# Patient Record
Sex: Female | Born: 1945
Health system: Southern US, Community
[De-identification: ages and names within clinical notes are randomized; demographics above are authoritative.]

## PROBLEM LIST (undated history)

## (undated) DIAGNOSIS — E039 Hypothyroidism, unspecified: Secondary | ICD-10-CM

## (undated) DIAGNOSIS — M810 Age-related osteoporosis without current pathological fracture: Secondary | ICD-10-CM

## (undated) DIAGNOSIS — E669 Obesity, unspecified: Secondary | ICD-10-CM

## (undated) DIAGNOSIS — R51 Headache: Secondary | ICD-10-CM

## (undated) DIAGNOSIS — E785 Hyperlipidemia, unspecified: Secondary | ICD-10-CM

## (undated) DIAGNOSIS — T8859XA Other complications of anesthesia, initial encounter: Secondary | ICD-10-CM

## (undated) DIAGNOSIS — I10 Essential (primary) hypertension: Secondary | ICD-10-CM

## (undated) DIAGNOSIS — F419 Anxiety disorder, unspecified: Secondary | ICD-10-CM

## (undated) DIAGNOSIS — T7840XA Allergy, unspecified, initial encounter: Secondary | ICD-10-CM

## (undated) DIAGNOSIS — K219 Gastro-esophageal reflux disease without esophagitis: Secondary | ICD-10-CM

## (undated) DIAGNOSIS — F329 Major depressive disorder, single episode, unspecified: Secondary | ICD-10-CM

## (undated) DIAGNOSIS — F32A Depression, unspecified: Secondary | ICD-10-CM

## (undated) DIAGNOSIS — E079 Disorder of thyroid, unspecified: Secondary | ICD-10-CM

## (undated) DIAGNOSIS — I493 Ventricular premature depolarization: Secondary | ICD-10-CM

## (undated) DIAGNOSIS — N3281 Overactive bladder: Secondary | ICD-10-CM

## (undated) DIAGNOSIS — R001 Bradycardia, unspecified: Secondary | ICD-10-CM

## (undated) DIAGNOSIS — J449 Chronic obstructive pulmonary disease, unspecified: Secondary | ICD-10-CM

## (undated) DIAGNOSIS — E559 Vitamin D deficiency, unspecified: Secondary | ICD-10-CM

## (undated) DIAGNOSIS — T4145XA Adverse effect of unspecified anesthetic, initial encounter: Secondary | ICD-10-CM

## (undated) DIAGNOSIS — J42 Unspecified chronic bronchitis: Secondary | ICD-10-CM

## (undated) HISTORY — DX: Essential (primary) hypertension: I10

## (undated) HISTORY — DX: Obesity, unspecified: E66.9

## (undated) HISTORY — DX: Gastro-esophageal reflux disease without esophagitis: K21.9

## (undated) HISTORY — DX: Age-related osteoporosis without current pathological fracture: M81.0

## (undated) HISTORY — DX: Ventricular premature depolarization: I49.3

## (undated) HISTORY — PX: UPPER GASTROINTESTINAL ENDOSCOPY: SHX188

## (undated) HISTORY — DX: Allergy, unspecified, initial encounter: T78.40XA

## (undated) HISTORY — PX: ABDOMINAL HYSTERECTOMY: SHX81

## (undated) HISTORY — DX: Overactive bladder: N32.81

## (undated) HISTORY — DX: Anxiety disorder, unspecified: F41.9

## (undated) HISTORY — PX: COLONOSCOPY: SHX174

## (undated) HISTORY — PX: FRACTURE SURGERY: SHX138

## (undated) HISTORY — DX: Disorder of thyroid, unspecified: E07.9

## (undated) HISTORY — DX: Depression, unspecified: F32.A

## (undated) HISTORY — PX: SPINAL FUSION: SHX223

## (undated) HISTORY — DX: Hyperlipidemia, unspecified: E78.5

## (undated) HISTORY — DX: Major depressive disorder, single episode, unspecified: F32.9

## (undated) HISTORY — DX: Vitamin D deficiency, unspecified: E55.9

---

## 1985-05-27 HISTORY — PX: PARTIAL HYSTERECTOMY: SHX80

## 1988-05-27 HISTORY — PX: HIP FRACTURE SURGERY: SHX118

## 2000-02-11 ENCOUNTER — Emergency Department (HOSPITAL_COMMUNITY): Admission: EM | Admit: 2000-02-11 | Discharge: 2000-02-11 | Payer: Self-pay | Admitting: Emergency Medicine

## 2006-01-08 ENCOUNTER — Ambulatory Visit: Payer: Self-pay | Admitting: Cardiology

## 2006-01-21 ENCOUNTER — Encounter: Payer: Self-pay | Admitting: Cardiology

## 2006-01-21 ENCOUNTER — Ambulatory Visit: Payer: Self-pay

## 2008-11-23 ENCOUNTER — Encounter: Admission: RE | Admit: 2008-11-23 | Discharge: 2008-11-23 | Payer: Self-pay | Admitting: Cardiology

## 2010-04-23 ENCOUNTER — Ambulatory Visit: Payer: Self-pay | Admitting: Cardiology

## 2010-10-12 NOTE — Assessment & Plan Note (Signed)
[facility] OFFICE NOTE   NAME:[name], [name]                         MRN:          [id number]  DATE:01/08/2006                            DOB:          [birth year]    I was asked by Dr. Octavio Graves to evaluate [name], a very pleasant  65 year old, married, white female with an irregular heartbeat.  When she  was in Dr. Carmie End office recently, she was in ventricular bigeminy.   A TSH was normal, potassium was normal, as well as her other blood work.   She is having no dyspnea on exertion or anginal symptoms.  She had a heart  catheterization about six years ago with Dr. Doreatha Lew and this was apparently  normal.   PAST MEDICAL HISTORY:  Cardiac risk factors are significant for obesity and  hypertension.  She is also drinking about four cups of caffeinated beverages  a day.   ALLERGIES:  She is intolerant to SULFA, PENICILLIN, ANTIBIOTICS.   CURRENT MEDICATIONS:  1. Felodipine extended release 10 mg daily.  2. Synthroid 50 micrograms daily.  3. Alprazolam 0.25 p.o. p.r.n.  4. AcipHex 20 mg a day.   PAST SURGICAL HISTORY:  She has had a previous hysterectomy and a hip  replacement.  She is fairly immobile.   FAMILY HISTORY:  Noncontributory and negative for premature heart disease.   SOCIAL HISTORY:  She is a Agricultural engineer.  She has two children.  One has a  compulsive disorder and she pretty much has to look after him all the time.  In addition, her husband is in the hospital right now with a brain tumor,  but is getting better.  Obviously, she has been under a lot of stress.   REVIEW OF SYSTEMS:  Remarkable for constipation, fatigue, a lot of  gastroesophageal reflux, which is pretty classic and controlled with  AcipHex.  She has a history of hypothyroidism, on replacement therapy.  She  has chronic arthritis.   PHYSICAL EXAMINATION:  VITAL SIGNS:  Her blood pressure is 146/91, her pulse  was  71 and regular.  She is in sinus rhythm with one PVC on her EKG.  The  rest of her EKG is completely normal.  She may have some right atrial  enlargement.  Her height is 5 feet 4.  She weighs 260 pounds.  BMI is  obviously high.  HEENT:  Normocephalic, atraumatic.  PERRLA.  Extraocular movements intact.  Sclerae are clear.  Facial symmetry is normal.  Dentition is satisfactory.  NECK:  Carotid upstrokes were equal bilaterally, without bruits.  There is  no JVD.  Thyroid is not enlarged.  Trachea is midline.  LUNGS:  Clear.  HEART:  Reveals a regular rate and rhythm, soft S1, S2.  ABDOMEN:  Abdominal exam is soft with good bowel sounds.  She is obese,  making organomegaly difficult to assess.  EXTREMITIES:  Reveal no clubbing or cyanosis.  She has 1+ pitting edema.  Pulses are present.  NEUROLOGIC EXAM:  Intact.   ASSESSMENT AND PLAN:  1. Premature ventricular  beats.  At one point in Dr. Carmie End office,      there was ventricular bigeminy.  These are probably being aggravated by      the high level of stress, lack of sleep and also the caffeine she is      drinking.  We need to rule out structural heart disease.  Her blood      work is okay.  2. Hypertension, not under adequate control.  3. Obesity.   PLAN:  Obtain 2D echocardiogram to assess for structural heart disease and  elevated LV function, atrial sizes and question of pulmonary hypertension.  If this is within normal limits, or only shows mild LVH from her  hypertension, we will see her back on a p.r.n. basis.                               Thomas C. Verl Blalock, MD, [facility]    TCW/MedQ  DD:  01/08/2006  DT:  01/08/2006  Job #:  [id number]   cc:   Octavio Graves

## 2011-10-18 ENCOUNTER — Encounter: Payer: Self-pay | Admitting: *Deleted

## 2012-12-21 ENCOUNTER — Encounter (HOSPITAL_COMMUNITY): Payer: Self-pay | Admitting: *Deleted

## 2012-12-21 ENCOUNTER — Emergency Department (HOSPITAL_COMMUNITY): Payer: Medicare Other

## 2012-12-21 ENCOUNTER — Emergency Department (HOSPITAL_COMMUNITY)
Admission: EM | Admit: 2012-12-21 | Discharge: 2012-12-21 | Disposition: A | Discharge status: Home / Self Care | Payer: Medicare Other | Attending: Emergency Medicine | Admitting: Emergency Medicine

## 2012-12-21 DIAGNOSIS — R12 Heartburn: Secondary | ICD-10-CM | POA: Insufficient documentation

## 2012-12-21 DIAGNOSIS — Z881 Allergy status to other antibiotic agents status: Secondary | ICD-10-CM | POA: Insufficient documentation

## 2012-12-21 DIAGNOSIS — F411 Generalized anxiety disorder: Secondary | ICD-10-CM | POA: Insufficient documentation

## 2012-12-21 DIAGNOSIS — E079 Disorder of thyroid, unspecified: Secondary | ICD-10-CM | POA: Insufficient documentation

## 2012-12-21 DIAGNOSIS — R079 Chest pain, unspecified: Secondary | ICD-10-CM

## 2012-12-21 DIAGNOSIS — Z79899 Other long term (current) drug therapy: Secondary | ICD-10-CM | POA: Insufficient documentation

## 2012-12-21 DIAGNOSIS — I4949 Other premature depolarization: Secondary | ICD-10-CM | POA: Insufficient documentation

## 2012-12-21 DIAGNOSIS — Z888 Allergy status to other drugs, medicaments and biological substances status: Secondary | ICD-10-CM | POA: Insufficient documentation

## 2012-12-21 DIAGNOSIS — Z882 Allergy status to sulfonamides status: Secondary | ICD-10-CM | POA: Insufficient documentation

## 2012-12-21 DIAGNOSIS — Z880 Allergy status to penicillin: Secondary | ICD-10-CM | POA: Insufficient documentation

## 2012-12-21 DIAGNOSIS — I10 Essential (primary) hypertension: Secondary | ICD-10-CM | POA: Insufficient documentation

## 2012-12-21 DIAGNOSIS — K219 Gastro-esophageal reflux disease without esophagitis: Secondary | ICD-10-CM | POA: Insufficient documentation

## 2012-12-21 DIAGNOSIS — E669 Obesity, unspecified: Secondary | ICD-10-CM | POA: Insufficient documentation

## 2012-12-21 LAB — POCT I-STAT TROPONIN I
Troponin i, poc: 0.02 ng/mL (ref 0.00–0.08)
Troponin i, poc: 0 ng/mL (ref 0.00–0.08)

## 2012-12-21 LAB — CBC
Platelets: 327 10*3/uL (ref 150–400)
RBC: 4.72 MIL/uL (ref 3.87–5.11)
WBC: 9 10*3/uL (ref 4.0–10.5)

## 2012-12-21 LAB — BASIC METABOLIC PANEL
CO2: 29 mEq/L (ref 19–32)
Calcium: 9.6 mg/dL (ref 8.4–10.5)
Chloride: 101 mEq/L (ref 96–112)
Potassium: 3.6 mEq/L (ref 3.5–5.1)
Sodium: 140 mEq/L (ref 135–145)

## 2012-12-21 MED ORDER — GI COCKTAIL ~~LOC~~
30.0000 mL | Freq: Once | ORAL | Status: AC
  Administered 2012-12-21: 30 mL via ORAL
  Filled 2012-12-21: qty 30

## 2012-12-21 NOTE — ED Notes (Signed)
Dr Danny Lawless at bedside

## 2012-12-21 NOTE — ED Provider Notes (Signed)
CSN: [id number]     Arrival date & time 12/21/12  1526 History     First MD Initiated Contact with Patient 12/21/12 1703     Chief Complaint  Patient presents with  . Chest Pain   (Consider location/radiation/quality/duration/timing/severity/associated sxs/prior Treatment) HPI Comments: Pt w/ PMHx of GERD, HTN now w/ chest pain. States worsening GERD over past several days. Has been taking felodipine w/ temporary relief of sx. Last pm had worsening heart burn a/w left sided chest pain. Took felodipine w/ relief of sx. Chest pain was not exertional or pleuritic. Not radiating to back. Not a/w dyspnea, nausea or diaphoresis. Pain lasted 3 hrs and resolved w/ antacid. No chest pressure or sharp pain. She states she was laying on left side w/ left arm propped up at onset of pain. Has been doing a lot of heavy lifting recently. No Hx of CAD - negative cath in remote past, negative stress test several years ago. Non smokier, + family hx of CAD. No personal hx of HLD, DM, DVT/PE. Pt denies pain today. No hx of baseline exertional dyspnea or chest pain or orthopnea. + hx of BLE swelling. No known hx of CHF.   Patient is a 67 y.o. female presenting with chest pain. The history is provided by the patient. No language interpreter was used.  Chest Pain Pain location:  L chest Pain quality: burning   Pain radiates to:  Does not radiate Pain radiates to the back: no   Pain severity:  No pain Onset quality:  Gradual Duration:  3 hours Timing:  Intermittent Progression:  Worsening Chronicity:  Recurrent Context: eating   Relieved by:  Antacids (belching) Worsened by:  Nothing tried Associated symptoms: no abdominal pain, no cough, no dizziness, no fever, no headache, no nausea, no shortness of breath and not vomiting     Past Medical History  Diagnosis Date  . Hypertension   . Thyroid disease   . Obesity   . Chronic anxiety   . PVC's (premature ventricular contractions)   . Esophageal reflux     Past Surgical History  Procedure Laterality Date  . Partial hysterectomy  1987  . Hip fracture surgery  1990     pins removed in 1991   Family History  Problem Relation Age of Onset  . Arthritis    . Heart disease    . Cancer    . Diabetes    . OCD     History  Substance Use Topics  . Smoking status: Never Smoker   . Smokeless tobacco: Not on file  . Alcohol Use: Not on file   OB History   Grav Para Term Preterm Abortions TAB SAB Ect Mult Living                 Review of Systems  Constitutional: Negative for fever and chills.  HENT: Negative for congestion and sore throat.   Respiratory: Negative for cough and shortness of breath.   Cardiovascular: Positive for chest pain. Negative for leg swelling.  Gastrointestinal: Negative for nausea, vomiting, abdominal pain, diarrhea and constipation.  Genitourinary: Negative for dysuria and frequency.  Skin: Negative for color change and rash.  Neurological: Negative for dizziness and headaches.  Psychiatric/Behavioral: Negative for confusion and agitation.  All other systems reviewed and are negative.    Allergies  Celebrex; Keflet; Kenalog; Lisinopril; Penicillins; and Sulfa antibiotics  Home Medications   Current Outpatient Rx  Name  Route  Sig  Dispense  Refill  .  felodipine (PLENDIL) 10 MG 24 hr tablet   Oral   Take 10 mg by mouth daily.         . hydrochlorothiazide (HYDRODIURIL) 25 MG tablet   Oral   Take 25 mg by mouth daily as needed (for fluid).         Marland Kitchen HYDROCODONE-ACETAMINOPHEN PO   Oral   Take 1 tablet by mouth every 6 (six) hours as needed (for pain).         Marland Kitchen levothyroxine (SYNTHROID, LEVOTHROID) 50 MCG tablet   Oral   Take 50 mcg by mouth daily.         . meclizine (ANTIVERT) 25 MG tablet   Oral   Take 25 mg by mouth 3 (three) times daily as needed for dizziness.         . pantoprazole (PROTONIX) 40 MG tablet   Oral   Take 40 mg by mouth daily.         . Vitamin D,  Ergocalciferol, (DRISDOL) 50000 UNITS CAPS   Oral   Take 50,000 Units by mouth 2 (two) times a week.          BP 149/102  Pulse 70  Temp(Src) 97.9 F (36.6 C) (Oral)  Resp 20  Wt 250 lb (113.399 kg)  SpO2 100% Physical Exam  Vitals reviewed. Constitutional: She is oriented to person, place, and time. She appears well-developed and well-nourished. No distress.  HENT:  Head: Normocephalic and atraumatic.  Eyes: EOM are normal. Pupils are equal, round, and reactive to light.  Neck: Normal range of motion. Neck supple.  Cardiovascular: Normal rate and regular rhythm.   Pulses:      Radial pulses are 2+ on the right side, and 2+ on the left side.  Pulmonary/Chest: Effort normal and breath sounds normal. No respiratory distress.  Abdominal: Soft. She exhibits no distension. There is no tenderness.  Musculoskeletal: Normal range of motion. She exhibits no edema.  Neurological: She is alert and oriented to person, place, and time.  Skin: Skin is warm and dry.  Psychiatric: She has a normal mood and affect. Her behavior is normal.    ED Course   Procedures (including critical care time)  Results for orders placed during the hospital encounter of 12/21/12  CBC      Result Value Range   WBC 9.0  4.0 - 10.5 K/uL   RBC 4.72  3.87 - 5.11 MIL/uL   Hemoglobin 13.5  12.0 - 15.0 g/dL   HCT 40.7  36.0 - 46.0 %   MCV 86.2  78.0 - 100.0 fL   MCH 28.6  26.0 - 34.0 pg   MCHC 33.2  30.0 - 36.0 g/dL   RDW 13.8  11.5 - 15.5 %   Platelets 327  150 - 400 K/uL  BASIC METABOLIC PANEL      Result Value Range   Sodium 140  135 - 145 mEq/L   Potassium 3.6  3.5 - 5.1 mEq/L   Chloride 101  96 - 112 mEq/L   CO2 29  19 - 32 mEq/L   Glucose, Bld 106 (*) 70 - 99 mg/dL   BUN 13  6 - 23 mg/dL   Creatinine, Ser 0.87  0.50 - 1.10 mg/dL   Calcium 9.6  8.4 - 10.5 mg/dL   GFR calc non Af Amer 68 (*) >90 mL/min   GFR calc Af Amer 79 (*) >90 mL/min  PRO B NATRIURETIC PEPTIDE      Result Value Range  Pro B Natriuretic peptide (BNP) 420.4 (*) 0 - 125 pg/mL  POCT I-STAT TROPONIN I      Result Value Range   Troponin i, poc 0.02  0.00 - 0.08 ng/mL   Comment 3           POCT I-STAT TROPONIN I      Result Value Range   Troponin i, poc 0.00  0.00 - 0.08 ng/mL   Comment 3             Dg Chest 2 View  12/21/2012   *RADIOLOGY REPORT*  Clinical Data: Chest pain  CHEST - 2 VIEW  Comparison: None.  Findings: Lungs are clear.  Heart is upper normal in size with normal pulmonary vascularity.  No adenopathy.  Aorta is mildly tortuous with atherosclerotic change in the aorta.  There is degenerative change in the thoracic spine. No pneumothorax.  IMPRESSION: No edema or consolidation.   Original Report Authenticated By: Lowella Grip, M.D.    Date: 12/21/2012  Rate: 72  Rhythm: normal sinus rhythm and premature ventricular contractions (PVC)  QRS Axis: normal  Intervals: normal  ST/T Wave abnormalities: normal  Conduction Disutrbances:none  Narrative Interpretation:   Old EKG Reviewed: none available   No diagnosis found.  MDM  Exam as above, NAD, vitals unremarkable, chest pain free. Likely GERD - given GI cocktail. Doubt ACS (low risk- HEART score - 3, TIMI 1) and atypical story, doubt PE (low risk Wells and PERC + only for age). Doubt spont PTX, tamponade or dissection. ECG w/ frequent PVC, no STEMI or TWI or ST depression - no acute ischemia. CXR - NACPF, initial troponin negative, labs unremarkable, BNP - 420, repeat 2 hr troponin negative. Given GI cocktail w/ some improvement. Given coke w/ relief of sx after belch. Stable for d/c home. Doubt ACS, likely GERD w/ component of musculoskeletal pain. D/c home in good and stable condition. Given return precautions and follow up instructions.   I have personally reviewed labs and imaging and considered in my MDM. Case d/w Dr Betsey Holiday  1. GERD (gastroesophageal reflux disease)   2. Chest pain    New Prescriptions   No medications on file    Theodoro Clock [place] [address] [phone]  Schedule an appointment as soon as possible for a visit      Ernst Spell, MD 12/21/12 [phone]

## 2012-12-21 NOTE — ED Notes (Signed)
Phlebotomy at bedside attempting lab draw 

## 2012-12-21 NOTE — ED Notes (Signed)
Pt in c/o left sided chest pain and pain to left axillary area, pt states she has a lot of indigestion so she has written this pain off as being from that but last night the pain was more to the left side of her chest and the pain under her left arm was new, states this pain has subsided today but her "indigestion" pain is mild at this time, pt points to left mid chest when asked to describe this pain. Pt admits to intermittent weakness at times, but denies other symptoms. Pt in no distress at this time.

## 2012-12-21 NOTE — ED Notes (Signed)
Dr. Pollina at bedside   

## 2012-12-21 NOTE — ED Notes (Signed)
Pt alert and mentating appropriately upon d/c. Pt given d/c teaching and follow up care instructions. NAD noted upon d/c. Pt offered wheelchair. Pt states she will be ok to walk. Pt leaving with family. Pt has no further questions upon d/c.

## 2012-12-21 NOTE — Discharge Instructions (Signed)
Chest Pain (Nonspecific) °It is often hard to give a specific diagnosis for the cause of chest pain. There is always a chance that your pain could be related to something serious, such as a heart attack or a blood clot in the lungs. You need to follow up with your caregiver for further evaluation. °CAUSES  °· Heartburn. °· Pneumonia or bronchitis. °· Anxiety or stress. °· Inflammation around your heart (pericarditis) or lung (pleuritis or pleurisy). °· A blood clot in the lung. °· A collapsed lung (pneumothorax). It can develop suddenly on its own (spontaneous pneumothorax) or from injury (trauma) to the chest. °· Shingles infection (herpes zoster virus). °The chest wall is composed of bones, muscles, and cartilage. Any of these can be the source of the pain. °· The bones can be bruised by injury. °· The muscles or cartilage can be strained by coughing or overwork. °· The cartilage can be affected by inflammation and become sore (costochondritis). °DIAGNOSIS  °Lab tests or other studies, such as X-rays, electrocardiography, stress testing, or cardiac imaging, may be needed to find the cause of your pain.  °TREATMENT  °· Treatment depends on what may be causing your chest pain. Treatment may include: °· Acid blockers for heartburn. °· Anti-inflammatory medicine. °· Pain medicine for inflammatory conditions. °· Antibiotics if an infection is present. °· You may be advised to change lifestyle habits. This includes stopping smoking and avoiding alcohol, caffeine, and chocolate. °· You may be advised to keep your head raised (elevated) when sleeping. This reduces the chance of acid going backward from your stomach into your esophagus. °· Most of the time, nonspecific chest pain will improve within 2 to 3 days with rest and mild pain medicine. °HOME CARE INSTRUCTIONS  °· If antibiotics were prescribed, take your antibiotics as directed. Finish them even if you start to feel better. °· For the next few days, avoid physical  activities that bring on chest pain. Continue physical activities as directed. °· Do not smoke. °· Avoid drinking alcohol. °· Only take over-the-counter or prescription medicine for pain, discomfort, or fever as directed by your caregiver. °· Follow your caregiver's suggestions for further testing if your chest pain does not go away. °· Keep any follow-up appointments you made. If you do not go to an appointment, you could develop lasting (chronic) problems with pain. If there is any problem keeping an appointment, you must call to reschedule. °SEEK MEDICAL CARE IF:  °· You think you are having problems from the medicine you are taking. Read your medicine instructions carefully. °· Your chest pain does not go away, even after treatment. °· You develop a rash with blisters on your chest. °SEEK IMMEDIATE MEDICAL CARE IF:  °· You have increased chest pain or pain that spreads to your arm, neck, jaw, back, or abdomen. °· You develop shortness of breath, an increasing cough, or you are coughing up blood. °· You have severe back or abdominal pain, feel nauseous, or vomit. °· You develop severe weakness, fainting, or chills. °· You have a fever. °THIS IS AN EMERGENCY. Do not wait to see if the pain will go away. Get medical help at once. Call your local emergency services (911 in [place] Do not drive yourself to the hospital. °MAKE SURE YOU:  °· Understand these instructions. °· Will watch your condition. °· Will get help right away if you are not doing well or get worse. °Document Released: 02/20/2005 Document Revised: 08/05/2011 Document Reviewed: 12/17/2007 °ExitCare® Patient Information ©2014 [employer],   LL[employer].  Diet for Gastroesophageal Reflux Disease, Adult Reflux (acid reflux) is when acid from your stomach flows up into the esophagus. When acid comes in contact with the esophagus, the acid causes irritation and soreness (inflammation) in the esophagus. When reflux happens often or so severely that it causes damage to  the esophagus, it is called gastroesophageal reflux disease (GERD). Nutrition therapy can help ease the discomfort of GERD. FOODS OR DRINKS TO AVOID OR LIMIT  Smoking or chewing tobacco. Nicotine is one of the most potent stimulants to acid production in the gastrointestinal tract.  Caffeinated and decaffeinated coffee and black tea.  Regular or low-calorie carbonated beverages or energy drinks (caffeine-free carbonated beverages are allowed).   Strong spices, such as black pepper, white pepper, red pepper, cayenne, curry powder, and chili powder.  Peppermint or spearmint.  Chocolate.  High-fat foods, including meats and fried foods. Extra added fats including oils, butter, salad dressings, and nuts. Limit these to less than 8 tsp per day.  Fruits and vegetables if they are not tolerated, such as citrus fruits or tomatoes.  Alcohol.  Any food that seems to aggravate your condition. If you have questions regarding your diet, call your caregiver or a registered dietitian. OTHER THINGS THAT MAY HELP GERD INCLUDE:   Eating your meals slowly, in a relaxed setting.  Eating 5 to 6 small meals per day instead of 3 large meals.  Eliminating food for a period of time if it causes distress.  Not lying down until 3 hours after eating a meal.  Keeping the head of your bed raised 6 to 9 inches (15 to 23 cm) by using a foam wedge or blocks under the legs of the bed. Lying flat may make symptoms worse.  Being physically active. Weight loss may be helpful in reducing reflux in overweight or obese adults.  Wear loose fitting clothing EXAMPLE MEAL PLAN This meal plan is approximately 2,000 calories based on [email or web address] meal planning guidelines. Breakfast   cup cooked oatmeal.  1 cup strawberries.  1 cup low-fat milk.  1 oz almonds. Snack  1 cup cucumber slices.  6 oz yogurt (made from low-fat or fat-free milk). Lunch  2 slice whole-wheat bread.  2 oz sliced  [place].  2 tsp mayonnaise.  1 cup blueberries.  1 cup snap peas. Snack  6 whole-wheat crackers.  1 oz string cheese. Dinner   cup brown rice.  1 cup mixed veggies.  1 tsp olive oil.  3 oz grilled fish. Document Released: 05/13/2005 Document Revised: 08/05/2011 Document Reviewed: 03/29/2011 [facility] Patient Information 2014 [place], [place].   Gastroesophageal Reflux Disease, Adult Gastroesophageal reflux disease (GERD) happens when acid from your stomach flows up into the esophagus. When acid comes in contact with the esophagus, the acid causes soreness (inflammation) in the esophagus. Over time, GERD may create small holes (ulcers) in the lining of the esophagus. CAUSES   Increased body weight. This puts pressure on the stomach, making acid rise from the stomach into the esophagus.  Smoking. This increases acid production in the stomach.  Drinking alcohol. This causes decreased pressure in the lower esophageal sphincter (valve or ring of muscle between the esophagus and stomach), allowing acid from the stomach into the esophagus.  Late evening meals and a full stomach. This increases pressure and acid production in the stomach.  A malformed lower esophageal sphincter. Sometimes, no cause is found. SYMPTOMS   Burning pain in the lower part of the mid-chest behind the breastbone and in  the mid-stomach area. This may occur twice a week or more often.  Trouble swallowing.  Sore throat.  Dry cough.  Asthma-like symptoms including chest tightness, shortness of breath, or wheezing. DIAGNOSIS  Your caregiver may be able to diagnose GERD based on your symptoms. In some cases, X-rays and other tests may be done to check for complications or to check the condition of your stomach and esophagus. TREATMENT  Your caregiver may recommend over-the-counter or prescription medicines to help decrease acid production. Ask your caregiver before starting or adding any new medicines.   HOME CARE INSTRUCTIONS   Change the factors that you can control. Ask your caregiver for guidance concerning weight loss, quitting smoking, and alcohol consumption.  Avoid foods and drinks that make your symptoms worse, such as:  Caffeine or alcoholic drinks.  Chocolate.  Peppermint or mint flavorings.  Garlic and onions.  Spicy foods.  Citrus fruits, such as oranges, lemons, or limes.  Tomato-based foods such as sauce, chili, salsa, and pizza.  Fried and fatty foods.  Avoid lying down for the 3 hours prior to your bedtime or prior to taking a nap.  Eat small, frequent meals instead of large meals.  Wear loose-fitting clothing. Do not wear anything tight around your waist that causes pressure on your stomach.  Raise the head of your bed 6 to 8 inches with wood blocks to help you sleep. Extra pillows will not help.  Only take over-the-counter or prescription medicines for pain, discomfort, or fever as directed by your caregiver.  Do not take aspirin, ibuprofen, or other nonsteroidal anti-inflammatory drugs (NSAIDs). SEEK IMMEDIATE MEDICAL CARE IF:   You have pain in your arms, neck, jaw, teeth, or back.  Your pain increases or changes in intensity or duration.  You develop nausea, vomiting, or sweating (diaphoresis).  You develop shortness of breath, or you faint.  Your vomit is green, yellow, black, or looks like coffee grounds or blood.  Your stool is red, bloody, or black. These symptoms could be signs of other problems, such as heart disease, gastric bleeding, or esophageal bleeding. MAKE SURE YOU:   Understand these instructions.  Will watch your condition.  Will get help right away if you are not doing well or get worse. Document Released: 02/20/2005 Document Revised: 08/05/2011 Document Reviewed: 11/30/2010 [facility] Patient Information 2014 [place], [place].

## 2012-12-23 NOTE — ED Provider Notes (Signed)
I saw and evaluated the patient, reviewed the resident's note and I agree with the findings and plan.  Patient presented with atypical symptoms felt to be consistent with GERD rather than cardiac. Exam normal. Workup unremarkable. Agree with resident interpretation of EKG.  Orpah Greek, MD 12/23/12 [phone]

## 2013-09-09 DIAGNOSIS — M1990 Unspecified osteoarthritis, unspecified site: Secondary | ICD-10-CM | POA: Diagnosis not present

## 2013-09-09 DIAGNOSIS — E559 Vitamin D deficiency, unspecified: Secondary | ICD-10-CM | POA: Diagnosis not present

## 2013-09-09 DIAGNOSIS — I10 Essential (primary) hypertension: Secondary | ICD-10-CM | POA: Diagnosis not present

## 2013-09-09 DIAGNOSIS — K219 Gastro-esophageal reflux disease without esophagitis: Secondary | ICD-10-CM | POA: Diagnosis not present

## 2013-09-09 DIAGNOSIS — E039 Hypothyroidism, unspecified: Secondary | ICD-10-CM | POA: Diagnosis not present

## 2013-09-09 DIAGNOSIS — R7309 Other abnormal glucose: Secondary | ICD-10-CM | POA: Diagnosis not present

## 2013-09-27 DIAGNOSIS — K8020 Calculus of gallbladder without cholecystitis without obstruction: Secondary | ICD-10-CM | POA: Diagnosis not present

## 2013-09-27 DIAGNOSIS — E876 Hypokalemia: Secondary | ICD-10-CM | POA: Diagnosis not present

## 2013-09-27 DIAGNOSIS — R1011 Right upper quadrant pain: Secondary | ICD-10-CM | POA: Diagnosis not present

## 2013-09-27 DIAGNOSIS — I10 Essential (primary) hypertension: Secondary | ICD-10-CM | POA: Diagnosis not present

## 2013-10-01 ENCOUNTER — Encounter (INDEPENDENT_AMBULATORY_CARE_PROVIDER_SITE_OTHER): Payer: Self-pay | Admitting: Surgery

## 2013-10-04 ENCOUNTER — Encounter (INDEPENDENT_AMBULATORY_CARE_PROVIDER_SITE_OTHER): Payer: Self-pay | Admitting: Surgery

## 2013-10-04 ENCOUNTER — Ambulatory Visit (INDEPENDENT_AMBULATORY_CARE_PROVIDER_SITE_OTHER): Payer: Medicare Other | Admitting: Surgery

## 2013-10-04 VITALS — BP 152/80 | HR 74 | Temp 97.2°F | Resp 16 | Ht 67.0 in | Wt 264.0 lb

## 2013-10-04 DIAGNOSIS — K8010 Calculus of gallbladder with chronic cholecystitis without obstruction: Secondary | ICD-10-CM

## 2013-10-04 NOTE — Progress Notes (Signed)
Patient ID: [name], female   DOB: 04-09-1946, 68 y.o.   MRN: [id number]  Chief Complaint  Patient presents with  . New Evaluation    eval gallstones    HPI [name] is a 68 y.o. female.  Referred by Cindy Nearing, PA at the [facility] for gallbladder disease  HPI This is a 68 yo female with documented gallstones in 2010 who presents with worsening symptoms of epigastric pain and diarrhea.  These symptoms seem to be worse after eating greasy foods.  She reports some chest tightness when these symptoms are present.  She had a previous work-up by Cardiology that discovered the presence of the gallstones.  She has significant reflux symptoms that are usually well-controlled with Zegerid.  She had a previous endoscopic work-up by Dr. Juanita Saunders that showed only GERD.  Past Medical History  Diagnosis Date  . Hypertension   . Thyroid disease   . Obesity   . Chronic anxiety   . PVC's (premature ventricular contractions)   . Esophageal reflux   . Depression   . Allergy   . Hyperlipidemia   . Osteoporosis   . Overactive bladder   . Vitamin D deficiency disease     Past Surgical History  Procedure Laterality Date  . Partial hysterectomy  1987  . Hip fracture surgery  1990     pins removed in 1991    Family History  Problem Relation Age of Onset  . Arthritis    . Heart disease    . Cancer    . Diabetes    . OCD      Social History History  Substance Use Topics  . Smoking status: Never Smoker   . Smokeless tobacco: Not on file  . Alcohol Use: No    Allergies  Allergen Reactions  . Celebrex [Celecoxib] Swelling  . Keflet [Cephalexin] Swelling  . Kenalog [Triamcinolone Acetonide]     unknown  . Lisinopril Other (See Comments)    unknown  . Penicillins Hives and Swelling  . Sulfa Antibiotics Swelling    Current Outpatient Prescriptions  Medication Sig Dispense Refill  . ACETAMINOPHEN ER PO Take by mouth.      . ALPRAZolam (XANAX) 0.5 MG tablet  Take 0.5 mg by mouth at bedtime as needed for anxiety.      Marland Kitchen buPROPion (WELLBUTRIN XL) 150 MG 24 hr tablet Take 150 mg by mouth daily.      . fluticasone (FLONASE) 50 MCG/ACT nasal spray Place into both nostrils daily.      . hydrochlorothiazide (HYDRODIURIL) 25 MG tablet Take 25 mg by mouth daily as needed (for fluid).      Marland Kitchen levothyroxine (SYNTHROID, LEVOTHROID) 50 MCG tablet Take 50 mcg by mouth daily.      Marland Kitchen losartan (COZAAR) 100 MG tablet Take 100 mg by mouth daily.      . meclizine (ANTIVERT) 25 MG tablet Take 25 mg by mouth 3 (three) times daily as needed for dizziness.      . pantoprazole (PROTONIX) 40 MG tablet Take 40 mg by mouth daily.      . potassium chloride (K-DUR) 10 MEQ tablet Take 10 mEq by mouth daily.      . sertraline (ZOLOFT) 100 MG tablet Take 100 mg by mouth daily.      . Vitamin D, Ergocalciferol, (DRISDOL) 50000 UNITS CAPS Take 50,000 Units by mouth 2 (two) times a week.      . felodipine (PLENDIL) 10 MG 24 hr tablet  Take 10 mg by mouth daily.      Marland Kitchen HYDROCODONE-ACETAMINOPHEN PO Take 1 tablet by mouth every 6 (six) hours as needed (for pain).      . meloxicam (MOBIC) 7.5 MG tablet Take 7.5 mg by mouth daily.      . simvastatin (ZOCOR) 20 MG tablet Take 20 mg by mouth daily.      Marland Kitchen tolterodine (DETROL LA) 4 MG 24 hr capsule Take 4 mg by mouth daily.       No current facility-administered medications for this visit.    Review of Systems Review of Systems  Constitutional: Negative for fever, chills and unexpected weight change.  HENT: Negative for congestion, hearing loss, sore throat, trouble swallowing and voice change.   Eyes: Negative for visual disturbance.  Respiratory: Negative for cough and wheezing.   Cardiovascular: Positive for chest pain. Negative for palpitations and leg swelling.  Gastrointestinal: Positive for abdominal pain, diarrhea and abdominal distention. Negative for nausea, vomiting, constipation, blood in stool and anal bleeding.   Genitourinary: Negative for hematuria, vaginal bleeding and difficulty urinating.  Musculoskeletal: Negative for arthralgias.  Skin: Negative for rash and wound.  Neurological: Negative for seizures, syncope and headaches.  Hematological: Negative for adenopathy. Does not bruise/bleed easily.  Psychiatric/Behavioral: Negative for confusion.    Blood pressure 152/80, pulse 74, temperature 97.2 F (36.2 C), temperature source Temporal, resp. rate 16, height 5\' 7"  (1.702 m), weight 264 lb (119.75 kg).  Physical Exam Physical Exam WDWN in NAD HEENT:  EOMI, sclera anicteric Neck:  No masses, no thyromegaly Lungs:  CTA bilaterally; normal respiratory effort CV:  Regular rate and rhythm; no murmurs Abd:  +bowel sounds, soft, minimal epigastric tenderness;  Ext:  Well-perfused; no edema Skin:  Warm, dry; no sign of jaundice  Data Reviewed Ultrasound 11/23/08 Clinical Data: Epigastric and chest pain, hypertension  COMPLETE ABDOMINAL ULTRASOUND  Comparison: None  Findings:  Gallbladder: There are small gallstones present, with the largest  measuring 1.2 cm in maximum diameter. No pain is present over the  gallbladder upon compression, and no gallbladder wall thickening is  seen.  Common bile duct: The common bile duct is normal measuring 6.0 mm  in diameter.  Liver: The liver is echogenic consistent with fatty infiltration.  No focal abnormality is seen and no ductal dilatation is noted.  IVC: Visualized.  Pancreas: The pancreas is normal in size.  Spleen: The spleen appears normal.  Right Kidney: No hydronephrosis is seen. The right kidney  measures 10.4 cm sagittally.  Left Kidney: No hydronephrosis. The left kidney measures 10.6 cm.  Abdominal aorta: The abdominal aorta is normal in caliber.  IMPRESSION:  1. Small gallstones with the largest measuring 1.2 cm in maximum  diameter. No gallbladder wall thickening or pain over the  gallbladder.  2. Fatty infiltration of the  liver.  Provider: Ulyess Mort   Assessment    Chronic calculus cholecystitis Gastroesophageal reflux disease     Plan    Laparoscopic cholecystectomy with intraoperative cholangiogram. The surgical procedure has been discussed with the patient.  Potential risks, benefits, alternative treatments, and expected outcomes have been explained.  All of the patient's questions at this time have been answered.  The likelihood of reaching the patient's treatment goal is good.  The patient understand the proposed surgical procedure and wishes to proceed. The patient understands that this surgery will do little to affect her GERD symptoms.  If her symptoms worsen, she will return to see Dr. Collene Mares.  Imogene Burn. Terianne Thaker 10/04/2013, 10:32 AM

## 2013-10-13 DIAGNOSIS — L255 Unspecified contact dermatitis due to plants, except food: Secondary | ICD-10-CM | POA: Diagnosis not present

## 2013-10-19 ENCOUNTER — Telehealth (INDEPENDENT_AMBULATORY_CARE_PROVIDER_SITE_OTHER): Payer: Self-pay | Admitting: General Surgery

## 2013-10-19 NOTE — Telephone Encounter (Signed)
Pt called to ask what some words on her diagnosis for surgery meant.  She spelled out "cholecystitis with calculi-primary."  Explained all to her satisfactory understanding.

## 2013-10-20 ENCOUNTER — Ambulatory Visit (INDEPENDENT_AMBULATORY_CARE_PROVIDER_SITE_OTHER): Payer: Medicare Other | Admitting: General Surgery

## 2013-10-20 ENCOUNTER — Encounter (INDEPENDENT_AMBULATORY_CARE_PROVIDER_SITE_OTHER): Payer: Self-pay | Admitting: Surgery

## 2013-10-25 ENCOUNTER — Encounter (HOSPITAL_COMMUNITY): Payer: Self-pay

## 2013-10-26 NOTE — Pre-Procedure Instructions (Signed)
Keyerra Ansell  10/26/2013   Your procedure is scheduled on:  Thurs, June 11 @ 11:30 AM  Report to [facility] at 9:30 AM.  Call this number if you have problems the morning of surgery: [phone]   Remember:   Do not eat food or drink liquids after midnight.   Take these medicines the morning of surgery with A SIP OF WATER: Xanax(Alprazolam),Wellbutrin(Bupropion),Synthroid(Levothyroxine),Antivert(Meclizine),Zegerid(Omeprazole-Sodium Bicarb),Pantoprazole(Protonix),and Sertraline(Zoloft)                No Goody's,BC's,Aleve,Aspirin,Ibuprofen,Fish Oil,or any Herbal Medications    Do not wear jewelry, make-up or nail polish.  Do not wear lotions, powders, or perfumes. You may wear deodorant.  Do not shave 48 hours prior to surgery.   Do not bring valuables to the hospital.  [facility] is not responsible                  for any belongings or valuables.               Contacts, dentures or bridgework may not be worn into surgery.  Leave suitcase in the car. After surgery it may be brought to your room.  For patients admitted to the hospital, discharge time is determined by your                treatment team.               Patients discharged the day of surgery will not be allowed to drive  home.    Special Instructions:  [place] - Preparing for Surgery  Before surgery, you can play an important role.  Because skin is not sterile, your skin needs to be as free of germs as possible.  You can reduce the number of germs on you skin by washing with CHG (chlorahexidine gluconate) soap before surgery.  CHG is an antiseptic cleaner which kills germs and bonds with the skin to continue killing germs even after washing.  Please DO NOT use if you have an allergy to CHG or antibacterial soaps.  If your skin becomes reddened/irritated stop using the CHG and inform your nurse when you arrive at Short Stay.  Do not shave (including legs and underarms) for at least 48 hours prior to the  first CHG shower.  You may shave your face.  Please follow these instructions carefully:   1.  Shower with CHG Soap the night before surgery and the                                morning of Surgery.  2.  If you choose to wash your hair, wash your hair first as usual with your       normal shampoo.  3.  After you shampoo, rinse your hair and body thoroughly to remove the                      Shampoo.  4.  Use CHG as you would any other liquid soap.  You can apply chg directly       to the skin and wash gently with scrungie or a clean washcloth.  5.  Apply the CHG Soap to your body ONLY FROM THE NECK DOWN.        Do not use on open wounds or open sores.  Avoid contact with your eyes,       ears, mouth and genitals (private  parts).  Wash genitals (private parts)       with your normal soap.  6.  Wash thoroughly, paying special attention to the area where your surgery        will be performed.  7.  Thoroughly rinse your body with warm water from the neck down.  8.  DO NOT shower/wash with your normal soap after using and rinsing off       the CHG Soap.  9.  Pat yourself dry with a clean towel.            10.  Wear clean pajamas.            11.  Place clean sheets on your bed the night of your first shower and do not        sleep with pets.  Day of Surgery  Do not apply any lotions/deoderants the morning of surgery.  Please wear clean clothes to the hospital/surgery center.     Please read over the following fact sheets that you were given: Pain Booklet, Coughing and Deep Breathing, Blood Transfusion Information and Surgical Site Infection Prevention

## 2013-10-27 ENCOUNTER — Encounter (HOSPITAL_COMMUNITY)
Admission: RE | Admit: 2013-10-27 | Discharge: 2013-10-27 | Disposition: A | Discharge status: Home / Self Care | Payer: Medicare Other | Source: Ambulatory Visit | Attending: Surgery | Admitting: Surgery

## 2013-10-27 ENCOUNTER — Encounter (HOSPITAL_COMMUNITY): Payer: Self-pay

## 2013-10-27 DIAGNOSIS — Z01812 Encounter for preprocedural laboratory examination: Secondary | ICD-10-CM | POA: Diagnosis not present

## 2013-10-27 HISTORY — DX: Other complications of anesthesia, initial encounter: T88.59XA

## 2013-10-27 HISTORY — DX: Headache: R51

## 2013-10-27 HISTORY — DX: Unspecified chronic bronchitis: J42

## 2013-10-27 HISTORY — DX: Chronic obstructive pulmonary disease, unspecified: J44.9

## 2013-10-27 HISTORY — DX: Hypothyroidism, unspecified: E03.9

## 2013-10-27 HISTORY — DX: Adverse effect of unspecified anesthetic, initial encounter: T41.45XA

## 2013-10-27 LAB — CBC
HEMATOCRIT: 39.1 % (ref 36.0–46.0)
Hemoglobin: 12.8 g/dL (ref 12.0–15.0)
MCH: 28.7 pg (ref 26.0–34.0)
MCHC: 32.7 g/dL (ref 30.0–36.0)
MCV: 87.7 fL (ref 78.0–100.0)
Platelets: 379 10*3/uL (ref 150–400)
RBC: 4.46 MIL/uL (ref 3.87–5.11)
RDW: 14.1 % (ref 11.5–15.5)
WBC: 9.5 10*3/uL (ref 4.0–10.5)

## 2013-10-27 LAB — BASIC METABOLIC PANEL
BUN: 11 mg/dL (ref 6–23)
CO2: 30 meq/L (ref 19–32)
CREATININE: 0.99 mg/dL (ref 0.50–1.10)
Calcium: 9.5 mg/dL (ref 8.4–10.5)
Chloride: 101 mEq/L (ref 96–112)
GFR calc Af Amer: 67 mL/min — ABNORMAL LOW (ref >90)
GFR calc non Af Amer: 58 mL/min — ABNORMAL LOW (ref >90)
Glucose, Bld: 104 mg/dL — ABNORMAL HIGH (ref 70–99)
Potassium: 3.9 mEq/L (ref 3.7–5.3)
Sodium: 140 mEq/L (ref 137–147)

## 2013-11-03 MED ORDER — CIPROFLOXACIN IN D5W 400 MG/200ML IV SOLN
400.0000 mg | INTRAVENOUS | Status: DC
  Filled 2013-11-03: qty 200

## 2013-11-03 MED ORDER — CHLORHEXIDINE GLUCONATE 4 % EX LIQD
1.0000 "application " | Freq: Once | CUTANEOUS | Status: DC
  Filled 2013-11-03: qty 15

## 2013-11-04 ENCOUNTER — Ambulatory Visit (HOSPITAL_COMMUNITY): Payer: Medicare Other

## 2013-11-04 ENCOUNTER — Ambulatory Visit (HOSPITAL_COMMUNITY): Payer: Medicare Other | Admitting: Certified Registered Nurse Anesthetist

## 2013-11-04 ENCOUNTER — Encounter (HOSPITAL_COMMUNITY): Payer: Self-pay | Admitting: Anesthesiology

## 2013-11-04 ENCOUNTER — Encounter (HOSPITAL_COMMUNITY): Payer: Medicare Other | Admitting: Certified Registered Nurse Anesthetist

## 2013-11-04 ENCOUNTER — Ambulatory Visit (HOSPITAL_COMMUNITY)
Admission: RE | Admit: 2013-11-04 | Discharge: 2013-11-04 | Disposition: A | Discharge status: Home / Self Care | Payer: Medicare Other | Source: Ambulatory Visit | Attending: Surgery | Admitting: Surgery

## 2013-11-04 ENCOUNTER — Encounter (HOSPITAL_COMMUNITY)
Admission: RE | Disposition: A | Discharge status: Home / Self Care | Payer: Self-pay | Source: Ambulatory Visit | Attending: Surgery

## 2013-11-04 DIAGNOSIS — I10 Essential (primary) hypertension: Secondary | ICD-10-CM | POA: Insufficient documentation

## 2013-11-04 DIAGNOSIS — K8010 Calculus of gallbladder with chronic cholecystitis without obstruction: Secondary | ICD-10-CM

## 2013-11-04 DIAGNOSIS — Z79899 Other long term (current) drug therapy: Secondary | ICD-10-CM | POA: Insufficient documentation

## 2013-11-04 DIAGNOSIS — Z880 Allergy status to penicillin: Secondary | ICD-10-CM | POA: Insufficient documentation

## 2013-11-04 DIAGNOSIS — E559 Vitamin D deficiency, unspecified: Secondary | ICD-10-CM | POA: Diagnosis not present

## 2013-11-04 DIAGNOSIS — E669 Obesity, unspecified: Secondary | ICD-10-CM | POA: Diagnosis not present

## 2013-11-04 DIAGNOSIS — F3289 Other specified depressive episodes: Secondary | ICD-10-CM | POA: Diagnosis not present

## 2013-11-04 DIAGNOSIS — F411 Generalized anxiety disorder: Secondary | ICD-10-CM | POA: Diagnosis not present

## 2013-11-04 DIAGNOSIS — E785 Hyperlipidemia, unspecified: Secondary | ICD-10-CM | POA: Insufficient documentation

## 2013-11-04 DIAGNOSIS — E079 Disorder of thyroid, unspecified: Secondary | ICD-10-CM | POA: Diagnosis not present

## 2013-11-04 DIAGNOSIS — K819 Cholecystitis, unspecified: Secondary | ICD-10-CM | POA: Diagnosis not present

## 2013-11-04 DIAGNOSIS — K7689 Other specified diseases of liver: Secondary | ICD-10-CM | POA: Diagnosis not present

## 2013-11-04 DIAGNOSIS — F329 Major depressive disorder, single episode, unspecified: Secondary | ICD-10-CM | POA: Diagnosis not present

## 2013-11-04 DIAGNOSIS — M810 Age-related osteoporosis without current pathological fracture: Secondary | ICD-10-CM | POA: Diagnosis not present

## 2013-11-04 DIAGNOSIS — Z888 Allergy status to other drugs, medicaments and biological substances status: Secondary | ICD-10-CM | POA: Insufficient documentation

## 2013-11-04 DIAGNOSIS — Z882 Allergy status to sulfonamides status: Secondary | ICD-10-CM | POA: Insufficient documentation

## 2013-11-04 DIAGNOSIS — Z886 Allergy status to analgesic agent status: Secondary | ICD-10-CM | POA: Diagnosis not present

## 2013-11-04 DIAGNOSIS — J449 Chronic obstructive pulmonary disease, unspecified: Secondary | ICD-10-CM | POA: Diagnosis not present

## 2013-11-04 DIAGNOSIS — K219 Gastro-esophageal reflux disease without esophagitis: Secondary | ICD-10-CM | POA: Diagnosis not present

## 2013-11-04 HISTORY — PX: CHOLECYSTECTOMY: SHX55

## 2013-11-04 SURGERY — LAPAROSCOPIC CHOLECYSTECTOMY WITH INTRAOPERATIVE CHOLANGIOGRAM
Anesthesia: General | Site: Abdomen

## 2013-11-04 MED ORDER — 0.9 % SODIUM CHLORIDE (POUR BTL) OPTIME
TOPICAL | Status: DC | PRN
  Administered 2013-11-04: 1000 mL

## 2013-11-04 MED ORDER — ONDANSETRON HCL 4 MG/2ML IJ SOLN: 4.0000 mg | INTRAMUSCULAR | Status: DC | PRN

## 2013-11-04 MED ORDER — PROPOFOL 10 MG/ML IV BOLUS
INTRAVENOUS | Status: DC | PRN
  Administered 2013-11-04: 150 mg via INTRAVENOUS

## 2013-11-04 MED ORDER — OXYCODONE HCL 5 MG PO TABS: 5.0000 mg | ORAL_TABLET | Freq: Once | ORAL | Status: DC | PRN

## 2013-11-04 MED ORDER — MORPHINE SULFATE 2 MG/ML IJ SOLN: 2.0000 mg | INTRAMUSCULAR | Status: DC | PRN

## 2013-11-04 MED ORDER — ONDANSETRON HCL 4 MG/2ML IJ SOLN
INTRAMUSCULAR | Status: AC
  Filled 2013-11-04: qty 2

## 2013-11-04 MED ORDER — LIDOCAINE HCL (CARDIAC) 20 MG/ML IV SOLN
INTRAVENOUS | Status: DC | PRN
  Administered 2013-11-04: 30 mg via INTRAVENOUS

## 2013-11-04 MED ORDER — BUPIVACAINE-EPINEPHRINE (PF) 0.25% -1:200000 IJ SOLN
INTRAMUSCULAR | Status: AC
  Filled 2013-11-04: qty 30

## 2013-11-04 MED ORDER — ONDANSETRON HCL 4 MG/2ML IJ SOLN
INTRAMUSCULAR | Status: DC | PRN
Start: 2013-11-04 — End: 2013-11-04
  Administered 2013-11-04: 4 mg via INTRAVENOUS

## 2013-11-04 MED ORDER — DEXAMETHASONE SODIUM PHOSPHATE 4 MG/ML IJ SOLN
INTRAMUSCULAR | Status: DC | PRN
  Administered 2013-11-04: 4 mg via INTRAVENOUS

## 2013-11-04 MED ORDER — GLYCOPYRROLATE 0.2 MG/ML IJ SOLN
INTRAMUSCULAR | Status: DC | PRN
  Administered 2013-11-04: 0.2 mg via INTRAVENOUS
  Administered 2013-11-04: 0.6 mg via INTRAVENOUS

## 2013-11-04 MED ORDER — OXYCODONE-ACETAMINOPHEN 5-325 MG PO TABS
1.0000 | ORAL_TABLET | ORAL | Status: DC | PRN
Start: 2013-11-04 — End: 2013-11-04

## 2013-11-04 MED ORDER — LACTATED RINGERS IV SOLN
INTRAVENOUS | Status: DC
  Administered 2013-11-04: 10:00:00 via INTRAVENOUS

## 2013-11-04 MED ORDER — LACTATED RINGERS IV SOLN
INTRAVENOUS | Status: DC | PRN
  Administered 2013-11-04: 11:00:00 via INTRAVENOUS

## 2013-11-04 MED ORDER — MIDAZOLAM HCL 2 MG/2ML IJ SOLN
INTRAMUSCULAR | Status: AC
  Filled 2013-11-04: qty 2

## 2013-11-04 MED ORDER — ARTIFICIAL TEARS OP OINT
TOPICAL_OINTMENT | OPHTHALMIC | Status: AC
  Filled 2013-11-04: qty 3.5

## 2013-11-04 MED ORDER — ROCURONIUM BROMIDE 100 MG/10ML IV SOLN
INTRAVENOUS | Status: DC | PRN
  Administered 2013-11-04: 45 mg via INTRAVENOUS

## 2013-11-04 MED ORDER — BUPIVACAINE-EPINEPHRINE 0.25% -1:200000 IJ SOLN
INTRAMUSCULAR | Status: DC | PRN
  Administered 2013-11-04: 30 mL

## 2013-11-04 MED ORDER — HYDROMORPHONE HCL PF 1 MG/ML IJ SOLN: 0.2500 mg | INTRAMUSCULAR | Status: DC | PRN

## 2013-11-04 MED ORDER — FENTANYL CITRATE 0.05 MG/ML IJ SOLN
INTRAMUSCULAR | Status: AC
  Filled 2013-11-04: qty 5

## 2013-11-04 MED ORDER — SODIUM CHLORIDE 0.9 % IR SOLN
Status: DC | PRN
  Administered 2013-11-04: 1000 mL

## 2013-11-04 MED ORDER — ONDANSETRON HCL 4 MG/2ML IJ SOLN: 4.0000 mg | Freq: Once | INTRAMUSCULAR | Status: DC | PRN

## 2013-11-04 MED ORDER — NEOSTIGMINE METHYLSULFATE 10 MG/10ML IV SOLN
INTRAVENOUS | Status: DC | PRN
  Administered 2013-11-04: 4 mg via INTRAVENOUS

## 2013-11-04 MED ORDER — PROPOFOL 10 MG/ML IV BOLUS
INTRAVENOUS | Status: AC
  Filled 2013-11-04: qty 20

## 2013-11-04 MED ORDER — OXYCODONE HCL 5 MG/5ML PO SOLN: 5.0000 mg | Freq: Once | ORAL | Status: DC | PRN

## 2013-11-04 MED ORDER — ROCURONIUM BROMIDE 50 MG/5ML IV SOLN
INTRAVENOUS | Status: AC
  Filled 2013-11-04: qty 1

## 2013-11-04 MED ORDER — NEOSTIGMINE METHYLSULFATE 10 MG/10ML IV SOLN
INTRAVENOUS | Status: AC
  Filled 2013-11-04: qty 1

## 2013-11-04 MED ORDER — GLYCOPYRROLATE 0.2 MG/ML IJ SOLN
INTRAMUSCULAR | Status: AC
  Filled 2013-11-04: qty 2

## 2013-11-04 MED ORDER — ARTIFICIAL TEARS OP OINT
TOPICAL_OINTMENT | OPHTHALMIC | Status: DC | PRN
  Administered 2013-11-04: 1 via OPHTHALMIC

## 2013-11-04 MED ORDER — FENTANYL CITRATE 0.05 MG/ML IJ SOLN
INTRAMUSCULAR | Status: DC | PRN
  Administered 2013-11-04: 100 ug via INTRAVENOUS
  Administered 2013-11-04: 50 ug via INTRAVENOUS

## 2013-11-04 MED ORDER — SODIUM CHLORIDE 0.9 % IV SOLN
INTRAVENOUS | Status: DC | PRN
  Administered 2013-11-04 (×2)

## 2013-11-04 MED ORDER — OXYCODONE-ACETAMINOPHEN 5-325 MG PO TABS: 1.0000 | ORAL_TABLET | ORAL | Status: DC | PRN

## 2013-11-04 MED ORDER — CIPROFLOXACIN IN D5W 400 MG/200ML IV SOLN
INTRAVENOUS | Status: AC
  Administered 2013-11-04: 400 mg via INTRAVENOUS
  Filled 2013-11-04: qty 200

## 2013-11-04 MED ORDER — HEMOSTATIC AGENTS (NO CHARGE) OPTIME
TOPICAL | Status: DC | PRN
  Administered 2013-11-04: 1 via TOPICAL

## 2013-11-04 SURGICAL SUPPLY — 54 items
APL SKNCLS STERI-STRIP NONHPOA (GAUZE/BANDAGES/DRESSINGS) ×1
APPLIER CLIP ROT 10 11.4 M/L (STAPLE) ×3
APR CLP MED LRG 11.4X10 (STAPLE) ×1
BAG SPEC RTRVL LRG 6X4 10 (ENDOMECHANICALS) ×1
BENZOIN TINCTURE PRP APPL 2/3 (GAUZE/BANDAGES/DRESSINGS) ×3 IMPLANT
BLADE SURG ROTATE 9660 (MISCELLANEOUS) IMPLANT
CANISTER SUCTION 2500CC (MISCELLANEOUS) ×3 IMPLANT
CHLORAPREP W/TINT 26ML (MISCELLANEOUS) ×3 IMPLANT
CLIP APPLIE ROT 10 11.4 M/L (STAPLE) ×1 IMPLANT
CLOSURE STERI-STRIP 1/2X4 (GAUZE/BANDAGES/DRESSINGS) ×1
CLSR STERI-STRIP ANTIMIC 1/2X4 (GAUZE/BANDAGES/DRESSINGS) ×1 IMPLANT
COVER MAYO STAND STRL (DRAPES) ×3 IMPLANT
COVER SURGICAL LIGHT HANDLE (MISCELLANEOUS) ×3 IMPLANT
DRAPE C-ARM 42X72 X-RAY (DRAPES) ×3 IMPLANT
DRAPE UTILITY 15X26 W/TAPE STR (DRAPE) ×6 IMPLANT
DRSG TEGADERM 2-3/8X2-3/4 SM (GAUZE/BANDAGES/DRESSINGS) ×5 IMPLANT
DRSG TEGADERM 4X4.75 (GAUZE/BANDAGES/DRESSINGS) ×3 IMPLANT
ELECT REM PT RETURN 9FT ADLT (ELECTROSURGICAL) ×3
ELECTRODE REM PT RTRN 9FT ADLT (ELECTROSURGICAL) ×1 IMPLANT
FILTER SMOKE EVAC LAPAROSHD (FILTER) ×3 IMPLANT
GAUZE SPONGE 2X2 8PLY STRL LF (GAUZE/BANDAGES/DRESSINGS) ×1 IMPLANT
GLOVE BIO SURGEON STRL SZ7 (GLOVE) ×5 IMPLANT
GLOVE BIO SURGEON STRL SZ8 (GLOVE) ×4 IMPLANT
GLOVE BIOGEL PI IND STRL 6.5 (GLOVE) IMPLANT
GLOVE BIOGEL PI IND STRL 7.0 (GLOVE) IMPLANT
GLOVE BIOGEL PI IND STRL 7.5 (GLOVE) ×1 IMPLANT
GLOVE BIOGEL PI IND STRL 8 (GLOVE) IMPLANT
GLOVE BIOGEL PI INDICATOR 6.5 (GLOVE) ×2
GLOVE BIOGEL PI INDICATOR 7.0 (GLOVE) ×4
GLOVE BIOGEL PI INDICATOR 7.5 (GLOVE) ×2
GLOVE BIOGEL PI INDICATOR 8 (GLOVE) ×4
GLOVE SURG SS PI 6.5 STRL IVOR (GLOVE) ×2 IMPLANT
GLOVE SURG SS PI 7.0 STRL IVOR (GLOVE) ×2 IMPLANT
GOWN STRL REUS W/ TWL LRG LVL3 (GOWN DISPOSABLE) ×4 IMPLANT
GOWN STRL REUS W/TWL LRG LVL3 (GOWN DISPOSABLE) ×12
HEMOSTAT SNOW SURGICEL 2X4 (HEMOSTASIS) ×2 IMPLANT
KIT BASIN OR (CUSTOM PROCEDURE TRAY) ×3 IMPLANT
KIT ROOM TURNOVER OR (KITS) ×3 IMPLANT
NS IRRIG 1000ML POUR BTL (IV SOLUTION) ×3 IMPLANT
PAD ARMBOARD 7.5X6 YLW CONV (MISCELLANEOUS) ×3 IMPLANT
POUCH SPECIMEN RETRIEVAL 10MM (ENDOMECHANICALS) ×3 IMPLANT
SCISSORS LAP 5X35 DISP (ENDOMECHANICALS) ×3 IMPLANT
SET CHOLANGIOGRAPH 5 50 .035 (SET/KITS/TRAYS/PACK) ×3 IMPLANT
SET IRRIG TUBING LAPAROSCOPIC (IRRIGATION / IRRIGATOR) ×3 IMPLANT
SLEEVE ENDOPATH XCEL 5M (ENDOMECHANICALS) ×3 IMPLANT
SPECIMEN JAR SMALL (MISCELLANEOUS) ×3 IMPLANT
SPONGE GAUZE 2X2 STER 10/PKG (GAUZE/BANDAGES/DRESSINGS) ×2
SUT MNCRL AB 4-0 PS2 18 (SUTURE) ×3 IMPLANT
TOWEL OR 17X24 6PK STRL BLUE (TOWEL DISPOSABLE) ×3 IMPLANT
TOWEL OR 17X26 10 PK STRL BLUE (TOWEL DISPOSABLE) ×3 IMPLANT
TRAY LAPAROSCOPIC (CUSTOM PROCEDURE TRAY) ×3 IMPLANT
TROCAR XCEL BLUNT TIP 100MML (ENDOMECHANICALS) ×3 IMPLANT
TROCAR XCEL NON-BLD 11X100MML (ENDOMECHANICALS) ×3 IMPLANT
TROCAR XCEL NON-BLD 5MMX100MML (ENDOMECHANICALS) ×3 IMPLANT

## 2013-11-04 NOTE — Transfer of Care (Signed)
Immediate Anesthesia Transfer of Care Note  Patient: [name]  Procedure(s) Performed: Procedure(s): LAPAROSCOPIC CHOLECYSTECTOMY WITH INTRAOPERATIVE CHOLANGIOGRAM (N/A)  Patient Location: PACU  Anesthesia Type:General  Level of Consciousness: awake, alert  and oriented  Airway & Oxygen Therapy: Patient Spontanous Breathing and Patient connected to face mask oxygen  Post-op Assessment: Report given to PACU RN  Post vital signs: Reviewed and stable  Complications: No apparent anesthesia complications

## 2013-11-04 NOTE — Anesthesia Preprocedure Evaluation (Signed)
Anesthesia Evaluation  Patient identified by MRN, date of birth, ID band Patient awake    Reviewed: Allergy & Precautions, H&P , Patient's Chart, lab work & pertinent test results  Airway Mallampati: II TM Distance: >3 FB Neck ROM: Full    Dental  (+) Teeth Intact, Dental Advisory Given   Pulmonary  breath sounds clear to auscultation        Cardiovascular hypertension, Rhythm:Regular Rate:Normal     Neuro/Psych    GI/Hepatic   Endo/Other    Renal/GU      Musculoskeletal   Abdominal (+) + obese,   Peds  Hematology   Anesthesia Other Findings   Reproductive/Obstetrics                           Anesthesia Physical Anesthesia Plan  ASA: III  Anesthesia Plan: General   Post-op Pain Management:    Induction: Intravenous  Airway Management Planned: Oral ETT  Additional Equipment:   Intra-op Plan:   Post-operative Plan: Extubation in OR  Informed Consent: I have reviewed the patients History and Physical, chart, labs and discussed the procedure including the risks, benefits and alternatives for the proposed anesthesia with the patient or authorized representative who has indicated his/her understanding and acceptance.   Dental advisory given  Plan Discussed with: CRNA and Anesthesiologist  Anesthesia Plan Comments: (Symptomatic cholelithiasis Obesity Hypertension GERD H/O PVCs asymptomatic  Plan GA with oral ETT  Roberts Gaudy)        Anesthesia Quick Evaluation

## 2013-11-04 NOTE — Op Note (Signed)
Laparoscopic Cholecystectomy with IOC Procedure Note  Indications: This patient presents with symptomatic gallbladder disease and will undergo laparoscopic cholecystectomy.  Pre-operative Diagnosis: Calculus of gallbladder with other cholecystitis, without mention of obstruction  Post-operative Diagnosis: Same  Surgeon: Lameshia Hypolite K.   Assistants: Judyann Munson, RNFA  Anesthesia: General endotracheal anesthesia  ASA Class: 2  Procedure Details  The patient was seen again in the Holding Room. The risks, benefits, complications, treatment options, and expected outcomes were discussed with the patient. The possibilities of reaction to medication, pulmonary aspiration, perforation of viscus, bleeding, recurrent infection, finding a normal gallbladder, the need for additional procedures, failure to diagnose a condition, the possible need to convert to an open procedure, and creating a complication requiring transfusion or operation were discussed with the patient. The likelihood of improving the patient's symptoms with return to their baseline status is good.  The patient and/or family concurred with the proposed plan, giving informed consent. The site of surgery properly noted. The patient was taken to Operating Room, identified as [name] and the procedure verified as Laparoscopic Cholecystectomy with Intraoperative Cholangiogram. A Time Out was held and the above information confirmed.  Prior to the induction of general anesthesia, antibiotic prophylaxis was administered. General endotracheal anesthesia was then administered and tolerated well. After the induction, the abdomen was prepped with Chloraprep and draped in the sterile fashion. The patient was positioned in the supine position.  Local anesthetic agent was injected into the skin near the umbilicus and an incision made. We dissected down to the abdominal fascia with blunt dissection.  The fascia was incised vertically and we  entered the peritoneal cavity bluntly.  A pursestring suture of 0-Vicryl was placed around the fascial opening.  The Hasson cannula was inserted and secured with the stay suture.  Pneumoperitoneum was then created with CO2 and tolerated well without any adverse changes in the patient's vital signs. An 11-mm port was placed in the subxiphoid position.  Two 5-mm ports were placed in the right upper quadrant. All skin incisions were infiltrated with a local anesthetic agent before making the incision and placing the trocars.   We positioned the patient in reverse Trendelenburg, tilted slightly to the patient's left.  The gallbladder was identified, the fundus grasped and retracted cephalad. Adhesions were lysed bluntly and with the electrocautery where indicated, taking care not to injure any adjacent organs or viscus. The infundibulum was grasped and retracted laterally, exposing the peritoneum overlying the triangle of Calot. This was then divided and exposed in a blunt fashion. A critical view of the cystic duct and cystic artery was obtained.  The cystic duct was clearly identified and bluntly dissected circumferentially. The cystic duct was ligated with a clip distally.   An incision was made in the cystic duct and the [facility] cholangiogram catheter introduced. The catheter was secured using a clip. A cholangiogram was then obtained which showed good visualization of the distal and proximal biliary tree with no sign of filling defects or obstruction.  Contrast flowed easily into the duodenum. The catheter was then removed.   The cystic duct was then ligated with clips and divided. The cystic artery was identified, dissected free, ligated with clips and divided as well.   The gallbladder was dissected from the liver bed in retrograde fashion with the electrocautery. The gallbladder was removed and placed in an Endocatch sac. The liver bed was irrigated and inspected. Hemostasis was achieved with the  electrocautery and Surgicel SNOW. Copious irrigation was utilized and was  repeatedly aspirated until clear.  The gallbladder and Endocatch sac were then removed through the umbilical port site.  The pursestring suture was used to close the umbilical fascia.    We again inspected the right upper quadrant for hemostasis.  Pneumoperitoneum was released as we removed the trocars.  4-0 Monocryl was used to close the skin.   Benzoin, steri-strips, and clean dressings were applied. The patient was then extubated and brought to the recovery room in stable condition. Instrument, sponge, and needle counts were correct at closure and at the conclusion of the case.   Findings: Cholecystitis with Cholelithiasis  Estimated Blood Loss: less than 50 mL         Drains: none         Specimens: Gallbladder           Complications: None; patient tolerated the procedure well.         Disposition: PACU - hemodynamically stable.         Condition: stable  Imogene Burn. Georgette Dover, MD, [facility]  General/ Trauma Surgery  11/04/2013 12:37 PM

## 2013-11-04 NOTE — Anesthesia Postprocedure Evaluation (Signed)
  Anesthesia Post-op Note  Patient: [name]  Procedure(s) Performed: Procedure(s): LAPAROSCOPIC CHOLECYSTECTOMY WITH INTRAOPERATIVE CHOLANGIOGRAM (N/A)  Patient Location: PACU  Anesthesia Type:General  Level of Consciousness: awake, alert  and oriented  Airway and Oxygen Therapy: Patient Spontanous Breathing and Patient connected to nasal cannula oxygen  Post-op Pain: mild  Post-op Assessment: Post-op Vital signs reviewed, Patient's Cardiovascular Status Stable, Respiratory Function Stable, Patent Airway and Pain level controlled  Post-op Vital Signs: stable  Last Vitals:  Filed Vitals:   11/04/13 1453  BP: 133/67  Pulse: 47  Temp: 36.7 C  Resp: 16    Complications: No apparent anesthesia complications

## 2013-11-04 NOTE — H&P (Signed)
Patient ID: [name], female DOB: 01-18-1946, 68 y.o. MRN: [id number]  Chief Complaint   Patient presents with   .  New Evaluation     eval gallstones   HPI  [name] is a 68 y.o. female. Referred by Particia Nearing, PA at the [facility] for gallbladder disease  HPI  This is a 68 yo female with documented gallstones in 2010 who presents with worsening symptoms of epigastric pain and diarrhea. These symptoms seem to be worse after eating greasy foods. She reports some chest tightness when these symptoms are present. She had a previous work-up by Cardiology that discovered the presence of the gallstones. She has significant reflux symptoms that are usually well-controlled with Zegerid. She had a previous endoscopic work-up by Dr. Juanita Craver that showed only GERD.  Past Medical History   Diagnosis  Date   .  Hypertension    .  Thyroid disease    .  Obesity    .  Chronic anxiety    .  PVC's (premature ventricular contractions)    .  Esophageal reflux    .  Depression    .  Allergy    .  Hyperlipidemia    .  Osteoporosis    .  Overactive bladder    .  Vitamin D deficiency disease     Past Surgical History   Procedure  Laterality  Date   .  Partial hysterectomy   1987   .  Hip fracture surgery   1990     pins removed in 1991    Family History   Problem  Relation  Age of Onset   .  Arthritis     .  Heart disease     .  Cancer     .  Diabetes     .  OCD     Social History  History   Substance Use Topics   .  Smoking status:  Never Smoker   .  Smokeless tobacco:  Not on file   .  Alcohol Use:  No    Allergies   Allergen  Reactions   .  Celebrex [Celecoxib]  Swelling   .  Keflet [Cephalexin]  Swelling   .  Kenalog [Triamcinolone Acetonide]      unknown   .  Lisinopril  Other (See Comments)     unknown   .  Penicillins  Hives and Swelling   .  Sulfa Antibiotics  Swelling    Current Outpatient Prescriptions   Medication  Sig  Dispense  Refill   .   ACETAMINOPHEN ER PO  Take by mouth.     .  ALPRAZolam (XANAX) 0.5 MG tablet  Take 0.5 mg by mouth at bedtime as needed for anxiety.     Marland Kitchen  buPROPion (WELLBUTRIN XL) 150 MG 24 hr tablet  Take 150 mg by mouth daily.     .  fluticasone (FLONASE) 50 MCG/ACT nasal spray  Place into both nostrils daily.     .  hydrochlorothiazide (HYDRODIURIL) 25 MG tablet  Take 25 mg by mouth daily as needed (for fluid).     Marland Kitchen  levothyroxine (SYNTHROID, LEVOTHROID) 50 MCG tablet  Take 50 mcg by mouth daily.     Marland Kitchen  losartan (COZAAR) 100 MG tablet  Take 100 mg by mouth daily.     .  meclizine (ANTIVERT) 25 MG tablet  Take 25 mg by mouth 3 (three) times daily as needed for dizziness.     Marland Kitchen  pantoprazole (PROTONIX) 40 MG tablet  Take 40 mg by mouth daily.     .  potassium chloride (K-DUR) 10 MEQ tablet  Take 10 mEq by mouth daily.     .  sertraline (ZOLOFT) 100 MG tablet  Take 100 mg by mouth daily.     .  Vitamin D, Ergocalciferol, (DRISDOL) 50000 UNITS CAPS  Take 50,000 Units by mouth 2 (two) times a week.     .  felodipine (PLENDIL) 10 MG 24 hr tablet  Take 10 mg by mouth daily.     Marland Kitchen  HYDROCODONE-ACETAMINOPHEN PO  Take 1 tablet by mouth every 6 (six) hours as needed (for pain).     .  meloxicam (MOBIC) 7.5 MG tablet  Take 7.5 mg by mouth daily.     .  simvastatin (ZOCOR) 20 MG tablet  Take 20 mg by mouth daily.     Marland Kitchen  tolterodine (DETROL LA) 4 MG 24 hr capsule  Take 4 mg by mouth daily.      No current facility-administered medications for this visit.   Review of Systems  Review of Systems  Constitutional: Negative for fever, chills and unexpected weight change.  HENT: Negative for congestion, hearing loss, sore throat, trouble swallowing and voice change.  Eyes: Negative for visual disturbance.  Respiratory: Negative for cough and wheezing.  Cardiovascular: Positive for chest pain. Negative for palpitations and leg swelling.  Gastrointestinal: Positive for abdominal pain, diarrhea and abdominal distention.  Negative for nausea, vomiting, constipation, blood in stool and anal bleeding.  Genitourinary: Negative for hematuria, vaginal bleeding and difficulty urinating.  Musculoskeletal: Negative for arthralgias.  Skin: Negative for rash and wound.  Neurological: Negative for seizures, syncope and headaches.  Hematological: Negative for adenopathy. Does not bruise/bleed easily.  Psychiatric/Behavioral: Negative for confusion.  Blood pressure 152/80, pulse 74, temperature 97.2 F (36.2 C), temperature source Temporal, resp. rate 16, height 5\' 7"  (1.702 m), weight 264 lb (119.75 kg).  Physical Exam  Physical Exam  WDWN in NAD  HEENT: EOMI, sclera anicteric  Neck: No masses, no thyromegaly  Lungs: CTA bilaterally; normal respiratory effort  CV: Regular rate and rhythm; no murmurs  Abd: +bowel sounds, soft, minimal epigastric tenderness;  Ext: Well-perfused; no edema  Skin: Warm, dry; no sign of jaundice  Data Reviewed  Ultrasound 11/23/08  Clinical Data: Epigastric and chest pain, hypertension  COMPLETE ABDOMINAL ULTRASOUND  Comparison: None  Findings:  Gallbladder: There are small gallstones present, with the largest  measuring 1.2 cm in maximum diameter. No pain is present over the  gallbladder upon compression, and no gallbladder wall thickening is  seen.  Common bile duct: The common bile duct is normal measuring 6.0 mm  in diameter.  Liver: The liver is echogenic consistent with fatty infiltration.  No focal abnormality is seen and no ductal dilatation is noted.  IVC: Visualized.  Pancreas: The pancreas is normal in size.  Spleen: The spleen appears normal.  Right Kidney: No hydronephrosis is seen. The right kidney  measures 10.4 cm sagittally.  Left Kidney: No hydronephrosis. The left kidney measures 10.6 cm.  Abdominal aorta: The abdominal aorta is normal in caliber.  IMPRESSION:  1. Small gallstones with the largest measuring 1.2 cm in maximum  diameter. No gallbladder wall  thickening or pain over the  gallbladder.  2. Fatty infiltration of the liver.  Provider: Ulyess Mort  Assessment  Chronic calculus cholecystitis  Gastroesophageal reflux disease  Plan  Laparoscopic cholecystectomy with intraoperative cholangiogram.  The surgical  procedure has been discussed with the patient. Potential risks, benefits, alternative treatments, and expected outcomes have been explained. All of the patient's questions at this time have been answered. The likelihood of reaching the patient's treatment goal is good. The patient understand the proposed surgical procedure and wishes to proceed.  The patient understands that this surgery will do little to affect her GERD symptoms. If her symptoms worsen, she will return to see Dr. Collene Mares.    Imogene Burn. Georgette Dover, MD, [facility]  General/ Trauma Surgery  11/04/2013 10:49 AM

## 2013-11-04 NOTE — Discharge Instructions (Signed)
Laparoscopic Cholecystectomy, Care After °Refer to this sheet in the next few weeks. These instructions provide you with information on caring for yourself after your procedure. Your health care provider may also give you more specific instructions. Your treatment has been planned according to current medical practices, but problems sometimes occur. Call your health care provider if you have any problems or questions after your procedure. °WHAT TO EXPECT AFTER THE PROCEDURE °After your procedure, it is typical to have the following: °· Pain at your incision sites. You will be given pain medicines to control the pain. °· Mild nausea or vomiting. This should improve after the first 24 hours. °· Bloating and possibly shoulder pain from the gas used during the procedure. This will improve after the first 24 hours. °HOME CARE INSTRUCTIONS  °· Change bandages (dressings) as directed by your health care provider. °· Keep the wound dry and clean. You may wash the wound gently with soap and water. Gently blot or dab the area dry. °· Do not take baths or use swimming pools or hot tubs for 2 weeks or until your health care provider approves. °· Only take over-the-counter or prescription medicines as directed by your health care provider. °· Continue your normal diet as directed by your health care provider. °· Do not lift anything heavier than 10 pounds (4.5 kg) until your health care provider approves. °· Do not play contact sports for 1 week or until your health care provider approves. °SEEK MEDICAL CARE IF:  °· You have redness, swelling, or increasing pain in the wound. °· You notice yellowish-white fluid (pus) coming from the wound. °· You have drainage from the wound that lasts longer than 1 day. °· You notice a bad smell coming from the wound or dressing. °· Your surgical cuts (incisions) break open. °SEEK IMMEDIATE MEDICAL CARE IF:  °· You develop a rash. °· You have difficulty breathing. °· You have chest pain. °· You  have a fever. °· You have increasing pain in the shoulders (shoulder strap areas). °· You have dizzy episodes or faint while standing. °· You have severe abdominal pain. °· You feel sick to your stomach (nauseous) or throw up (vomit) and this lasts for more than 1 day. °Document Released: 05/13/2005 Document Revised: 03/03/2013 Document Reviewed: 12/23/2012 °ExitCare® Patient Information ©2014 [employer]. ° °

## 2013-11-05 ENCOUNTER — Telehealth (INDEPENDENT_AMBULATORY_CARE_PROVIDER_SITE_OTHER): Payer: Self-pay | Admitting: General Surgery

## 2013-11-05 NOTE — Telephone Encounter (Signed)
Called to check on voiding status; daughter-in-law states pt is still just barely going and feels pressure.  STRONGLY advised to take pt to ED for foley.  The longer she retains urine, the more likely she is to develop a UTI.  Daughter-in-law will pass on this information.

## 2013-11-05 NOTE — Telephone Encounter (Signed)
Daughter-in-law, Estill Bamberg, called to report the pt is home now and cannot void.  She feels the pressure to void, but is only dribbling.  Suggested the pt sit on toilet and pour a large amount of warm water across the lower abdomen and into the toilet.  The warm water running into the toilet water may be just enough to help her get started.  If this does not work, she needs to go to the ED for catheter.  Daughter-in-law understands.

## 2013-11-08 ENCOUNTER — Encounter (HOSPITAL_COMMUNITY): Payer: Self-pay | Admitting: Surgery

## 2013-11-19 ENCOUNTER — Ambulatory Visit (INDEPENDENT_AMBULATORY_CARE_PROVIDER_SITE_OTHER): Payer: Medicare Other | Admitting: Surgery

## 2013-11-19 ENCOUNTER — Encounter (INDEPENDENT_AMBULATORY_CARE_PROVIDER_SITE_OTHER): Payer: Self-pay | Admitting: Surgery

## 2013-11-19 VITALS — BP 140/82 | HR 56 | Temp 97.4°F | Resp 18 | Ht 64.0 in | Wt 231.4 lb

## 2013-11-19 DIAGNOSIS — K8010 Calculus of gallbladder with chronic cholecystitis without obstruction: Secondary | ICD-10-CM

## 2013-11-19 NOTE — Patient Instructions (Signed)
Add a daily fiber supplement such as metamucil or citrucel to your diet.

## 2013-11-19 NOTE — Progress Notes (Signed)
Status post laparoscopic cholecystectomy on 11/04/13 for chronic cholecystitis. She has some slight residual soreness at her umbilicus. She continues to have irregular bowel movements with intermittent diarrhea with increased flatulence. This has been a chronic problem since before her surgery. She also has significant reflux symptoms but this is also unchanged from before surgery. She no longer has any right upper quadrant pain.  Filed Vitals:   11/19/13 1043  BP: 140/82  Pulse: 56  Temp: 97.4 F (36.3 C)  Resp: 18   Her incisions are well-healed with no sign of infection. No dominant tenderness. No abdominal masses.  Status post laparoscopic cholecystectomy for chronic calculus cholecystitis. She continues to have some intermittent diarrhea. I recommended that she use a fiber supplement to help regulate her bowel movements. If this does not improve, she should call her GI doctor for a followup visit. She may resume full extubated he. Followup as needed.  Imogene Burn. Georgette Dover, MD, [facility] Surgery  General/ Trauma Surgery  11/19/2013 2:36 PM

## 2013-12-21 DIAGNOSIS — M79609 Pain in unspecified limb: Secondary | ICD-10-CM | POA: Diagnosis not present

## 2013-12-21 DIAGNOSIS — IMO0002 Reserved for concepts with insufficient information to code with codable children: Secondary | ICD-10-CM | POA: Diagnosis not present

## 2013-12-29 DIAGNOSIS — L821 Other seborrheic keratosis: Secondary | ICD-10-CM | POA: Diagnosis not present

## 2013-12-29 DIAGNOSIS — L219 Seborrheic dermatitis, unspecified: Secondary | ICD-10-CM | POA: Diagnosis not present

## 2013-12-29 DIAGNOSIS — H61009 Unspecified perichondritis of external ear, unspecified ear: Secondary | ICD-10-CM | POA: Diagnosis not present

## 2013-12-29 DIAGNOSIS — D235 Other benign neoplasm of skin of trunk: Secondary | ICD-10-CM | POA: Diagnosis not present

## 2013-12-30 DIAGNOSIS — K219 Gastro-esophageal reflux disease without esophagitis: Secondary | ICD-10-CM | POA: Diagnosis not present

## 2013-12-30 DIAGNOSIS — R198 Other specified symptoms and signs involving the digestive system and abdomen: Secondary | ICD-10-CM | POA: Diagnosis not present

## 2013-12-30 DIAGNOSIS — R197 Diarrhea, unspecified: Secondary | ICD-10-CM | POA: Diagnosis not present

## 2013-12-30 DIAGNOSIS — R141 Gas pain: Secondary | ICD-10-CM | POA: Diagnosis not present

## 2014-06-24 DIAGNOSIS — J309 Allergic rhinitis, unspecified: Secondary | ICD-10-CM | POA: Diagnosis not present

## 2014-06-24 DIAGNOSIS — K219 Gastro-esophageal reflux disease without esophagitis: Secondary | ICD-10-CM | POA: Diagnosis not present

## 2014-06-24 DIAGNOSIS — E039 Hypothyroidism, unspecified: Secondary | ICD-10-CM | POA: Diagnosis not present

## 2014-06-24 DIAGNOSIS — F341 Dysthymic disorder: Secondary | ICD-10-CM | POA: Diagnosis not present

## 2014-06-24 DIAGNOSIS — I10 Essential (primary) hypertension: Secondary | ICD-10-CM | POA: Diagnosis not present

## 2014-10-06 DIAGNOSIS — H6120 Impacted cerumen, unspecified ear: Secondary | ICD-10-CM | POA: Diagnosis not present

## 2014-10-06 DIAGNOSIS — H6593 Unspecified nonsuppurative otitis media, bilateral: Secondary | ICD-10-CM | POA: Diagnosis not present

## 2014-12-05 DIAGNOSIS — M4806 Spinal stenosis, lumbar region: Secondary | ICD-10-CM | POA: Diagnosis not present

## 2014-12-05 DIAGNOSIS — M170 Bilateral primary osteoarthritis of knee: Secondary | ICD-10-CM | POA: Diagnosis not present

## 2014-12-05 DIAGNOSIS — M4696 Unspecified inflammatory spondylopathy, lumbar region: Secondary | ICD-10-CM | POA: Diagnosis not present

## 2015-01-02 DIAGNOSIS — M47816 Spondylosis without myelopathy or radiculopathy, lumbar region: Secondary | ICD-10-CM | POA: Diagnosis not present

## 2015-01-02 DIAGNOSIS — M4806 Spinal stenosis, lumbar region: Secondary | ICD-10-CM | POA: Diagnosis not present

## 2015-01-02 DIAGNOSIS — M170 Bilateral primary osteoarthritis of knee: Secondary | ICD-10-CM | POA: Diagnosis not present

## 2015-04-03 DIAGNOSIS — M25561 Pain in right knee: Secondary | ICD-10-CM | POA: Diagnosis not present

## 2015-04-03 DIAGNOSIS — M4806 Spinal stenosis, lumbar region: Secondary | ICD-10-CM | POA: Diagnosis not present

## 2015-04-03 DIAGNOSIS — M170 Bilateral primary osteoarthritis of knee: Secondary | ICD-10-CM | POA: Diagnosis not present

## 2015-04-04 ENCOUNTER — Other Ambulatory Visit: Payer: Self-pay | Admitting: Sports Medicine

## 2015-04-04 DIAGNOSIS — M4800 Spinal stenosis, site unspecified: Secondary | ICD-10-CM

## 2015-04-11 DIAGNOSIS — M170 Bilateral primary osteoarthritis of knee: Secondary | ICD-10-CM | POA: Diagnosis not present

## 2015-04-17 DIAGNOSIS — M170 Bilateral primary osteoarthritis of knee: Secondary | ICD-10-CM | POA: Diagnosis not present

## 2015-04-25 ENCOUNTER — Other Ambulatory Visit: Payer: Medicare Other

## 2015-04-27 ENCOUNTER — Ambulatory Visit
Admission: RE | Admit: 2015-04-27 | Discharge: 2015-04-27 | Disposition: A | Discharge status: Home / Self Care | Payer: Medicare Other | Source: Ambulatory Visit | Attending: Sports Medicine | Admitting: Sports Medicine

## 2015-04-27 DIAGNOSIS — M4806 Spinal stenosis, lumbar region: Secondary | ICD-10-CM | POA: Diagnosis not present

## 2015-04-27 DIAGNOSIS — M4800 Spinal stenosis, site unspecified: Secondary | ICD-10-CM

## 2015-05-01 DIAGNOSIS — M4806 Spinal stenosis, lumbar region: Secondary | ICD-10-CM | POA: Diagnosis not present

## 2015-05-31 DIAGNOSIS — M25562 Pain in left knee: Secondary | ICD-10-CM | POA: Diagnosis not present

## 2015-05-31 DIAGNOSIS — M25561 Pain in right knee: Secondary | ICD-10-CM | POA: Diagnosis not present

## 2015-05-31 DIAGNOSIS — M5416 Radiculopathy, lumbar region: Secondary | ICD-10-CM | POA: Diagnosis not present

## 2015-05-31 DIAGNOSIS — M47816 Spondylosis without myelopathy or radiculopathy, lumbar region: Secondary | ICD-10-CM | POA: Diagnosis not present

## 2015-06-14 DIAGNOSIS — M47816 Spondylosis without myelopathy or radiculopathy, lumbar region: Secondary | ICD-10-CM | POA: Diagnosis not present

## 2015-07-31 DIAGNOSIS — M170 Bilateral primary osteoarthritis of knee: Secondary | ICD-10-CM | POA: Diagnosis not present

## 2015-08-11 DIAGNOSIS — M4716 Other spondylosis with myelopathy, lumbar region: Secondary | ICD-10-CM | POA: Diagnosis not present

## 2015-08-11 DIAGNOSIS — M4317 Spondylolisthesis, lumbosacral region: Secondary | ICD-10-CM | POA: Diagnosis not present

## 2015-09-14 DIAGNOSIS — K219 Gastro-esophageal reflux disease without esophagitis: Secondary | ICD-10-CM | POA: Diagnosis not present

## 2015-09-14 DIAGNOSIS — Z1211 Encounter for screening for malignant neoplasm of colon: Secondary | ICD-10-CM | POA: Diagnosis not present

## 2015-09-14 DIAGNOSIS — R194 Change in bowel habit: Secondary | ICD-10-CM | POA: Diagnosis not present

## 2015-09-25 DIAGNOSIS — M170 Bilateral primary osteoarthritis of knee: Secondary | ICD-10-CM | POA: Diagnosis not present

## 2015-10-02 DIAGNOSIS — M170 Bilateral primary osteoarthritis of knee: Secondary | ICD-10-CM | POA: Diagnosis not present

## 2015-10-09 DIAGNOSIS — M170 Bilateral primary osteoarthritis of knee: Secondary | ICD-10-CM | POA: Diagnosis not present

## 2015-10-25 DIAGNOSIS — M4716 Other spondylosis with myelopathy, lumbar region: Secondary | ICD-10-CM | POA: Diagnosis not present

## 2015-11-01 DIAGNOSIS — K219 Gastro-esophageal reflux disease without esophagitis: Secondary | ICD-10-CM | POA: Diagnosis not present

## 2015-11-01 DIAGNOSIS — I10 Essential (primary) hypertension: Secondary | ICD-10-CM | POA: Diagnosis not present

## 2015-11-01 DIAGNOSIS — Z0000 Encounter for general adult medical examination without abnormal findings: Secondary | ICD-10-CM | POA: Diagnosis not present

## 2015-11-01 DIAGNOSIS — M1990 Unspecified osteoarthritis, unspecified site: Secondary | ICD-10-CM | POA: Diagnosis not present

## 2015-11-01 DIAGNOSIS — E039 Hypothyroidism, unspecified: Secondary | ICD-10-CM | POA: Diagnosis not present

## 2015-11-16 DIAGNOSIS — Z1211 Encounter for screening for malignant neoplasm of colon: Secondary | ICD-10-CM | POA: Diagnosis not present

## 2015-11-16 DIAGNOSIS — Z1212 Encounter for screening for malignant neoplasm of rectum: Secondary | ICD-10-CM | POA: Diagnosis not present

## 2015-11-30 ENCOUNTER — Ambulatory Visit: Payer: Medicare Other | Admitting: Cardiology

## 2015-12-06 DIAGNOSIS — Z1211 Encounter for screening for malignant neoplasm of colon: Secondary | ICD-10-CM | POA: Diagnosis not present

## 2015-12-06 DIAGNOSIS — R194 Change in bowel habit: Secondary | ICD-10-CM | POA: Diagnosis not present

## 2015-12-12 ENCOUNTER — Ambulatory Visit: Payer: Medicare Other | Admitting: Cardiovascular Disease

## 2015-12-12 DIAGNOSIS — E039 Hypothyroidism, unspecified: Secondary | ICD-10-CM | POA: Diagnosis not present

## 2015-12-12 DIAGNOSIS — Z01818 Encounter for other preprocedural examination: Secondary | ICD-10-CM | POA: Diagnosis not present

## 2015-12-12 DIAGNOSIS — M4716 Other spondylosis with myelopathy, lumbar region: Secondary | ICD-10-CM | POA: Diagnosis not present

## 2015-12-12 DIAGNOSIS — F329 Major depressive disorder, single episode, unspecified: Secondary | ICD-10-CM | POA: Diagnosis not present

## 2015-12-12 DIAGNOSIS — E669 Obesity, unspecified: Secondary | ICD-10-CM | POA: Diagnosis not present

## 2015-12-12 DIAGNOSIS — K219 Gastro-esophageal reflux disease without esophagitis: Secondary | ICD-10-CM | POA: Diagnosis not present

## 2015-12-12 DIAGNOSIS — I517 Cardiomegaly: Secondary | ICD-10-CM | POA: Diagnosis not present

## 2015-12-12 DIAGNOSIS — I700 Atherosclerosis of aorta: Secondary | ICD-10-CM | POA: Diagnosis not present

## 2015-12-12 DIAGNOSIS — M1990 Unspecified osteoarthritis, unspecified site: Secondary | ICD-10-CM | POA: Diagnosis not present

## 2015-12-12 DIAGNOSIS — Z79899 Other long term (current) drug therapy: Secondary | ICD-10-CM | POA: Diagnosis not present

## 2015-12-12 DIAGNOSIS — F419 Anxiety disorder, unspecified: Secondary | ICD-10-CM | POA: Diagnosis not present

## 2015-12-12 DIAGNOSIS — I10 Essential (primary) hypertension: Secondary | ICD-10-CM | POA: Diagnosis not present

## 2015-12-18 DIAGNOSIS — Z881 Allergy status to other antibiotic agents status: Secondary | ICD-10-CM | POA: Diagnosis not present

## 2015-12-18 DIAGNOSIS — K588 Other irritable bowel syndrome: Secondary | ICD-10-CM | POA: Diagnosis not present

## 2015-12-18 DIAGNOSIS — I10 Essential (primary) hypertension: Secondary | ICD-10-CM | POA: Diagnosis not present

## 2015-12-18 DIAGNOSIS — M4326 Fusion of spine, lumbar region: Secondary | ICD-10-CM | POA: Diagnosis not present

## 2015-12-18 DIAGNOSIS — M6281 Muscle weakness (generalized): Secondary | ICD-10-CM | POA: Diagnosis not present

## 2015-12-18 DIAGNOSIS — M4716 Other spondylosis with myelopathy, lumbar region: Secondary | ICD-10-CM | POA: Diagnosis not present

## 2015-12-18 DIAGNOSIS — R0902 Hypoxemia: Secondary | ICD-10-CM | POA: Diagnosis not present

## 2015-12-18 DIAGNOSIS — Z79899 Other long term (current) drug therapy: Secondary | ICD-10-CM | POA: Diagnosis not present

## 2015-12-18 DIAGNOSIS — K5900 Constipation, unspecified: Secondary | ICD-10-CM | POA: Diagnosis not present

## 2015-12-18 DIAGNOSIS — F418 Other specified anxiety disorders: Secondary | ICD-10-CM | POA: Diagnosis not present

## 2015-12-18 DIAGNOSIS — R278 Other lack of coordination: Secondary | ICD-10-CM | POA: Diagnosis not present

## 2015-12-18 DIAGNOSIS — Z981 Arthrodesis status: Secondary | ICD-10-CM | POA: Diagnosis not present

## 2015-12-18 DIAGNOSIS — J9811 Atelectasis: Secondary | ICD-10-CM | POA: Diagnosis not present

## 2015-12-18 DIAGNOSIS — I2699 Other pulmonary embolism without acute cor pulmonale: Secondary | ICD-10-CM | POA: Diagnosis not present

## 2015-12-18 DIAGNOSIS — E669 Obesity, unspecified: Secondary | ICD-10-CM | POA: Diagnosis not present

## 2015-12-18 DIAGNOSIS — M4316 Spondylolisthesis, lumbar region: Secondary | ICD-10-CM | POA: Diagnosis not present

## 2015-12-18 DIAGNOSIS — E038 Other specified hypothyroidism: Secondary | ICD-10-CM | POA: Diagnosis not present

## 2015-12-18 DIAGNOSIS — Z880 Allergy status to penicillin: Secondary | ICD-10-CM | POA: Diagnosis not present

## 2015-12-18 DIAGNOSIS — Z888 Allergy status to other drugs, medicaments and biological substances status: Secondary | ICD-10-CM | POA: Diagnosis not present

## 2015-12-18 DIAGNOSIS — M4317 Spondylolisthesis, lumbosacral region: Secondary | ICD-10-CM | POA: Diagnosis not present

## 2015-12-18 DIAGNOSIS — K589 Irritable bowel syndrome without diarrhea: Secondary | ICD-10-CM | POA: Diagnosis present

## 2015-12-18 DIAGNOSIS — Z833 Family history of diabetes mellitus: Secondary | ICD-10-CM | POA: Diagnosis not present

## 2015-12-18 DIAGNOSIS — M47816 Spondylosis without myelopathy or radiculopathy, lumbar region: Secondary | ICD-10-CM | POA: Diagnosis not present

## 2015-12-18 DIAGNOSIS — M069 Rheumatoid arthritis, unspecified: Secondary | ICD-10-CM | POA: Diagnosis not present

## 2015-12-18 DIAGNOSIS — Z4889 Encounter for other specified surgical aftercare: Secondary | ICD-10-CM | POA: Diagnosis not present

## 2015-12-18 DIAGNOSIS — F329 Major depressive disorder, single episode, unspecified: Secondary | ICD-10-CM | POA: Diagnosis not present

## 2015-12-18 DIAGNOSIS — K567 Ileus, unspecified: Secondary | ICD-10-CM | POA: Diagnosis not present

## 2015-12-18 DIAGNOSIS — M47896 Other spondylosis, lumbar region: Secondary | ICD-10-CM | POA: Diagnosis not present

## 2015-12-18 DIAGNOSIS — F411 Generalized anxiety disorder: Secondary | ICD-10-CM | POA: Diagnosis not present

## 2015-12-18 DIAGNOSIS — M47897 Other spondylosis, lumbosacral region: Secondary | ICD-10-CM | POA: Diagnosis not present

## 2015-12-18 DIAGNOSIS — K219 Gastro-esophageal reflux disease without esophagitis: Secondary | ICD-10-CM | POA: Diagnosis not present

## 2015-12-18 DIAGNOSIS — R2689 Other abnormalities of gait and mobility: Secondary | ICD-10-CM | POA: Diagnosis not present

## 2015-12-18 DIAGNOSIS — Z6839 Body mass index (BMI) 39.0-39.9, adult: Secondary | ICD-10-CM | POA: Diagnosis not present

## 2015-12-18 DIAGNOSIS — E039 Hypothyroidism, unspecified: Secondary | ICD-10-CM | POA: Diagnosis present

## 2015-12-18 DIAGNOSIS — R600 Localized edema: Secondary | ICD-10-CM | POA: Diagnosis not present

## 2015-12-18 DIAGNOSIS — M4806 Spinal stenosis, lumbar region: Secondary | ICD-10-CM | POA: Diagnosis not present

## 2015-12-25 DIAGNOSIS — F418 Other specified anxiety disorders: Secondary | ICD-10-CM | POA: Diagnosis not present

## 2015-12-25 DIAGNOSIS — M6281 Muscle weakness (generalized): Secondary | ICD-10-CM | POA: Diagnosis not present

## 2015-12-25 DIAGNOSIS — E038 Other specified hypothyroidism: Secondary | ICD-10-CM | POA: Diagnosis not present

## 2015-12-25 DIAGNOSIS — M4716 Other spondylosis with myelopathy, lumbar region: Secondary | ICD-10-CM | POA: Diagnosis not present

## 2015-12-25 DIAGNOSIS — Z4889 Encounter for other specified surgical aftercare: Secondary | ICD-10-CM | POA: Diagnosis not present

## 2015-12-25 DIAGNOSIS — I10 Essential (primary) hypertension: Secondary | ICD-10-CM | POA: Diagnosis not present

## 2015-12-25 DIAGNOSIS — F411 Generalized anxiety disorder: Secondary | ICD-10-CM | POA: Diagnosis not present

## 2015-12-25 DIAGNOSIS — Z981 Arthrodesis status: Secondary | ICD-10-CM | POA: Diagnosis not present

## 2015-12-25 DIAGNOSIS — K219 Gastro-esophageal reflux disease without esophagitis: Secondary | ICD-10-CM | POA: Diagnosis not present

## 2015-12-25 DIAGNOSIS — K589 Irritable bowel syndrome without diarrhea: Secondary | ICD-10-CM | POA: Diagnosis not present

## 2015-12-25 DIAGNOSIS — R2689 Other abnormalities of gait and mobility: Secondary | ICD-10-CM | POA: Diagnosis not present

## 2015-12-25 DIAGNOSIS — R278 Other lack of coordination: Secondary | ICD-10-CM | POA: Diagnosis not present

## 2015-12-25 DIAGNOSIS — E039 Hypothyroidism, unspecified: Secondary | ICD-10-CM | POA: Diagnosis not present

## 2015-12-25 DIAGNOSIS — K588 Other irritable bowel syndrome: Secondary | ICD-10-CM | POA: Diagnosis not present

## 2015-12-25 DIAGNOSIS — F329 Major depressive disorder, single episode, unspecified: Secondary | ICD-10-CM | POA: Diagnosis not present

## 2015-12-25 DIAGNOSIS — M47896 Other spondylosis, lumbar region: Secondary | ICD-10-CM | POA: Diagnosis not present

## 2016-01-13 DIAGNOSIS — Z4789 Encounter for other orthopedic aftercare: Secondary | ICD-10-CM | POA: Diagnosis not present

## 2016-01-13 DIAGNOSIS — Z7901 Long term (current) use of anticoagulants: Secondary | ICD-10-CM | POA: Diagnosis not present

## 2016-01-13 DIAGNOSIS — I10 Essential (primary) hypertension: Secondary | ICD-10-CM | POA: Diagnosis not present

## 2016-01-13 DIAGNOSIS — F411 Generalized anxiety disorder: Secondary | ICD-10-CM | POA: Diagnosis not present

## 2016-01-13 DIAGNOSIS — Z9181 History of falling: Secondary | ICD-10-CM | POA: Diagnosis not present

## 2016-01-13 DIAGNOSIS — R2689 Other abnormalities of gait and mobility: Secondary | ICD-10-CM | POA: Diagnosis not present

## 2016-01-13 DIAGNOSIS — F329 Major depressive disorder, single episode, unspecified: Secondary | ICD-10-CM | POA: Diagnosis not present

## 2016-01-14 DIAGNOSIS — Z4789 Encounter for other orthopedic aftercare: Secondary | ICD-10-CM | POA: Diagnosis not present

## 2016-01-14 DIAGNOSIS — F411 Generalized anxiety disorder: Secondary | ICD-10-CM | POA: Diagnosis not present

## 2016-01-14 DIAGNOSIS — R2689 Other abnormalities of gait and mobility: Secondary | ICD-10-CM | POA: Diagnosis not present

## 2016-01-14 DIAGNOSIS — Z7901 Long term (current) use of anticoagulants: Secondary | ICD-10-CM | POA: Diagnosis not present

## 2016-01-14 DIAGNOSIS — F329 Major depressive disorder, single episode, unspecified: Secondary | ICD-10-CM | POA: Diagnosis not present

## 2016-01-14 DIAGNOSIS — I10 Essential (primary) hypertension: Secondary | ICD-10-CM | POA: Diagnosis not present

## 2016-01-16 DIAGNOSIS — F329 Major depressive disorder, single episode, unspecified: Secondary | ICD-10-CM | POA: Diagnosis not present

## 2016-01-16 DIAGNOSIS — Z7901 Long term (current) use of anticoagulants: Secondary | ICD-10-CM | POA: Diagnosis not present

## 2016-01-16 DIAGNOSIS — F411 Generalized anxiety disorder: Secondary | ICD-10-CM | POA: Diagnosis not present

## 2016-01-16 DIAGNOSIS — Z4789 Encounter for other orthopedic aftercare: Secondary | ICD-10-CM | POA: Diagnosis not present

## 2016-01-16 DIAGNOSIS — I10 Essential (primary) hypertension: Secondary | ICD-10-CM | POA: Diagnosis not present

## 2016-01-16 DIAGNOSIS — R2689 Other abnormalities of gait and mobility: Secondary | ICD-10-CM | POA: Diagnosis not present

## 2016-01-18 ENCOUNTER — Ambulatory Visit (INDEPENDENT_AMBULATORY_CARE_PROVIDER_SITE_OTHER): Payer: Medicare Other | Admitting: Pediatrics

## 2016-01-18 ENCOUNTER — Encounter: Payer: Self-pay | Admitting: Pediatrics

## 2016-01-18 ENCOUNTER — Encounter (INDEPENDENT_AMBULATORY_CARE_PROVIDER_SITE_OTHER): Payer: Self-pay

## 2016-01-18 VITALS — BP 101/60 | Temp 97.9°F | Ht 64.0 in | Wt 220.0 lb

## 2016-01-18 DIAGNOSIS — J449 Chronic obstructive pulmonary disease, unspecified: Secondary | ICD-10-CM | POA: Insufficient documentation

## 2016-01-18 DIAGNOSIS — I10 Essential (primary) hypertension: Secondary | ICD-10-CM | POA: Diagnosis not present

## 2016-01-18 DIAGNOSIS — E782 Mixed hyperlipidemia: Secondary | ICD-10-CM | POA: Insufficient documentation

## 2016-01-18 DIAGNOSIS — E785 Hyperlipidemia, unspecified: Secondary | ICD-10-CM

## 2016-01-18 DIAGNOSIS — I2699 Other pulmonary embolism without acute cor pulmonale: Secondary | ICD-10-CM | POA: Diagnosis not present

## 2016-01-18 DIAGNOSIS — F329 Major depressive disorder, single episode, unspecified: Secondary | ICD-10-CM

## 2016-01-18 DIAGNOSIS — F32A Depression, unspecified: Secondary | ICD-10-CM | POA: Insufficient documentation

## 2016-01-18 DIAGNOSIS — E039 Hypothyroidism, unspecified: Secondary | ICD-10-CM | POA: Diagnosis not present

## 2016-01-18 DIAGNOSIS — I700 Atherosclerosis of aorta: Secondary | ICD-10-CM

## 2016-01-18 DIAGNOSIS — Z981 Arthrodesis status: Secondary | ICD-10-CM | POA: Diagnosis not present

## 2016-01-18 MED ORDER — OXYCODONE-ACETAMINOPHEN 5-325 MG PO TABS: 1.0000 | ORAL_TABLET | Freq: Four times a day (QID) | ORAL | 0 refills | Status: DC | PRN

## 2016-01-18 MED ORDER — XARELTO 20 MG PO TABS: 20.0000 mg | ORAL_TABLET | Freq: Every day | ORAL | 1 refills | Status: DC

## 2016-01-18 MED ORDER — SERTRALINE HCL 100 MG PO TABS: 100.0000 mg | ORAL_TABLET | Freq: Every day | ORAL | 1 refills | Status: DC

## 2016-01-18 MED ORDER — LOSARTAN POTASSIUM 100 MG PO TABS: 100.0000 mg | ORAL_TABLET | Freq: Every day | ORAL | 1 refills | Status: DC

## 2016-01-18 MED ORDER — LEVOTHYROXINE SODIUM 50 MCG PO TABS
50.0000 ug | ORAL_TABLET | Freq: Every day | ORAL | 1 refills | Status: DC
Start: 2016-01-18 — End: 2016-05-14

## 2016-01-18 MED ORDER — HYDROCHLOROTHIAZIDE 25 MG PO TABS: 25.0000 mg | ORAL_TABLET | Freq: Every day | ORAL | 1 refills | Status: DC

## 2016-01-18 MED ORDER — AMLODIPINE BESYLATE 5 MG PO TABS: 5.0000 mg | ORAL_TABLET | Freq: Every day | ORAL | 1 refills | Status: DC

## 2016-01-18 NOTE — Progress Notes (Signed)
Subjective:   Patient ID: [name], female    DOB: Oct 05, 1945, 70 y.o.   MRN: [id number] CC: Hospitalization Follow-up (Had spinal fusion.) and New Patient (Initial Visit)  HPI: [name] is a 70 y.o. female presenting for Hospitalization Follow-up (Had spinal fusion.) and New Patient (Initial Visit)  Admitted to [facility] for planned spinal fusion with Dr Carloyn Manner, now coming out of rehab  Recent spinal fusion: Overall doing well  Has been out for a week Moving around better PT coming out to her for another week Living with son and DIL Moving back to her home in a few days, daughter will be staying with her Using oxycodone now twice a day, down from 4 tims a day Plans to continue to decrease it  Hyponatremia: Na to 132 at time of discharge from hospital  Bradycardia: HR 43 at admission to hosp for procedure Coreg stopped while in the hospital  HTN: started on amlodipine while in the hosp Continued on HCTZ, losartan No lightheadedness or dizziness Says BP has run high at times  Depression: Pt says she has been taking '100mg'$  sertraline prn at home, prescribed '200mg'$  daily Says mood has been fine  No SOB Does have some leg swelling, seems slightly worse since surgery  Pulm embolus:  Diagnosed while in hospital with episode of hypoxemia after surgery Dopplers of legs negative Started on xarelto No prior episodes of clot   Past Medical History:  Diagnosis Date  . Allergy   . Bronchitis, chronic ([place])   . Chronic anxiety   . Complication of anesthesia    hard to wake up  . COPD (chronic obstructive pulmonary disease) ([place])   . Depression   . Esophageal reflux   . Headache(784.0)   . Hyperlipidemia   . Hypertension   . Hypothyroidism   . Obesity   . Osteoporosis   . Overactive bladder   . PVC's (premature ventricular contractions)   . Thyroid disease   . Vitamin D deficiency disease    Family History  Problem Relation Age of Onset  . Arthritis    . Heart  disease    . Cancer    . Diabetes    . OCD     Social History   Social History  . Marital status: Single    Spouse name: N/A  . Number of children: 2  . Years of education: N/A   Social History Main Topics  . Smoking status: Never Smoker  . Smokeless tobacco: Never Used  . Alcohol use No  . Drug use: No  . Sexual activity: Not Asked   Other Topics Concern  . None   Social History Narrative  . None   ROS: All systems negative other than what is in HPI  Objective:    BP 101/60 (BP Location: Left Arm, Patient Position: Sitting, Cuff Size: Large)   Temp 97.9 F (36.6 C) (Oral)   Ht '5\' 4"'$  (1.626 m)   Wt 220 lb (99.8 kg)   BMI 37.76 kg/m   Wt Readings from Last 3 Encounters:  01/18/16 220 lb (99.8 kg)  11/19/13 231 lb 6.4 oz (105 kg)  11/04/13 230 lb (104.3 kg)    Gen: NAD, alert, cooperative with exam, NCAT EYES: EOMI, no conjunctival injection, or no icterus ENT:  TMs pearly gray b/l, OP without erythema LYMPH: no cervical LAD CV: NRRR, normal S1/S2 Resp: CTABL, no wheezes, normal WOB Abd: +BS, soft, NTND. Ext: Non-pitting edema present b/l LE, warm Neuro: Alert and oriented  Assessment & Plan:  [name] was seen today for hospitalization follow-up and new patient (initial visit). I have reviewed pt hospital records including labs, imaging.  Diagnoses and all orders for this visit:  Depression Well controlled Encouraged pt to take daily, not as needed Pt to discuss with PCP next visit -     sertraline (ZOLOFT) 100 MG tablet; Take 1 tablet (100 mg total) by mouth daily.  Other pulmonary embolism without acute cor pulmonale, unspecified chronicity (HCC) Provoked, recent surgery First lifetime blood clot No R heart strain on CT at time of diagnosis -     XARELTO 20 MG TABS tablet; Take 1 tablet (20 mg total) by mouth daily.  Essential hypertension Slightly low BP today, asymptomatic Hyponatremic in hosp to 132 Decrease amlodipine to '5mg'$  Check at  home Bring numbers to next clinic visit -     losartan (COZAAR) 100 MG tablet; Take 1 tablet (100 mg total) by mouth daily. -     hydrochlorothiazide (HYDRODIURIL) 25 MG tablet; Take 1 tablet (25 mg total) by mouth daily. -     amLODipine (NORVASC) 5 MG tablet; Take 1 tablet (5 mg total) by mouth daily. -     BMP8+EGFR  H/O spinal fusion Pt decreasing amount of narcotics, now to twice a day Cont PT F/u with Dr. Carloyn Manner next week Cont to decrease, use only as needed -     oxyCODONE-acetaminophen (PERCOCET/ROXICET) 5-325 MG tablet; Take 1 tablet by mouth every 6 (six) hours as needed.  Hypothyroidism, unspecified hypothyroidism type -     levothyroxine (SYNTHROID, LEVOTHROID) 50 MCG tablet; Take 1 tablet (50 mcg total) by mouth daily. -     TSH  Cardiomegaly and swelling If not improving, needs ECHO  HLD With thoracic aortic atherosclerosis on CT from hosp Lipid panel  Follow up plan: 4-6 weeks with PCP/Angel  Assunta Found, MD [place]

## 2016-01-19 DIAGNOSIS — R2689 Other abnormalities of gait and mobility: Secondary | ICD-10-CM | POA: Diagnosis not present

## 2016-01-19 DIAGNOSIS — I10 Essential (primary) hypertension: Secondary | ICD-10-CM | POA: Diagnosis not present

## 2016-01-19 DIAGNOSIS — F329 Major depressive disorder, single episode, unspecified: Secondary | ICD-10-CM | POA: Diagnosis not present

## 2016-01-19 DIAGNOSIS — Z7901 Long term (current) use of anticoagulants: Secondary | ICD-10-CM | POA: Diagnosis not present

## 2016-01-19 DIAGNOSIS — Z4789 Encounter for other orthopedic aftercare: Secondary | ICD-10-CM | POA: Diagnosis not present

## 2016-01-19 DIAGNOSIS — F411 Generalized anxiety disorder: Secondary | ICD-10-CM | POA: Diagnosis not present

## 2016-01-19 LAB — BMP8+EGFR
BUN / CREAT RATIO: 15 (ref 12–28)
BUN: 18 mg/dL (ref 8–27)
CO2: 29 mmol/L (ref 18–29)
CREATININE: 1.23 mg/dL — AB (ref 0.57–1.00)
Calcium: 9.7 mg/dL (ref 8.7–10.3)
Chloride: 94 mmol/L — ABNORMAL LOW (ref 96–106)
GFR calc non Af Amer: 45 mL/min/{1.73_m2} — ABNORMAL LOW (ref >59)
GFR, EST AFRICAN AMERICAN: 52 mL/min/{1.73_m2} — AB (ref >59)
Glucose: 95 mg/dL (ref 65–99)
Potassium: 3.8 mmol/L (ref 3.5–5.2)
Sodium: 139 mmol/L (ref 134–144)

## 2016-01-19 LAB — LIPID PANEL
CHOL/HDL RATIO: 3.6 ratio (ref 0.0–4.4)
Cholesterol, Total: 182 mg/dL (ref 100–199)
HDL: 50 mg/dL (ref >39)
LDL Calculated: 99 mg/dL (ref 0–99)
Triglycerides: 166 mg/dL — ABNORMAL HIGH (ref 0–149)
VLDL Cholesterol Cal: 33 mg/dL (ref 5–40)

## 2016-01-19 LAB — TSH: TSH: 2.12 u[IU]/mL (ref 0.450–4.500)

## 2016-01-22 DIAGNOSIS — Z4789 Encounter for other orthopedic aftercare: Secondary | ICD-10-CM | POA: Diagnosis not present

## 2016-01-22 DIAGNOSIS — R2689 Other abnormalities of gait and mobility: Secondary | ICD-10-CM | POA: Diagnosis not present

## 2016-01-22 DIAGNOSIS — F411 Generalized anxiety disorder: Secondary | ICD-10-CM | POA: Diagnosis not present

## 2016-01-22 DIAGNOSIS — I10 Essential (primary) hypertension: Secondary | ICD-10-CM | POA: Diagnosis not present

## 2016-01-22 DIAGNOSIS — F329 Major depressive disorder, single episode, unspecified: Secondary | ICD-10-CM | POA: Diagnosis not present

## 2016-01-22 DIAGNOSIS — Z7901 Long term (current) use of anticoagulants: Secondary | ICD-10-CM | POA: Diagnosis not present

## 2016-01-23 DIAGNOSIS — M4326 Fusion of spine, lumbar region: Secondary | ICD-10-CM | POA: Diagnosis not present

## 2016-01-23 DIAGNOSIS — M4716 Other spondylosis with myelopathy, lumbar region: Secondary | ICD-10-CM | POA: Diagnosis not present

## 2016-01-23 DIAGNOSIS — Z981 Arthrodesis status: Secondary | ICD-10-CM | POA: Diagnosis not present

## 2016-01-24 DIAGNOSIS — F329 Major depressive disorder, single episode, unspecified: Secondary | ICD-10-CM | POA: Diagnosis not present

## 2016-01-24 DIAGNOSIS — Z7901 Long term (current) use of anticoagulants: Secondary | ICD-10-CM | POA: Diagnosis not present

## 2016-01-24 DIAGNOSIS — F411 Generalized anxiety disorder: Secondary | ICD-10-CM | POA: Diagnosis not present

## 2016-01-24 DIAGNOSIS — R2689 Other abnormalities of gait and mobility: Secondary | ICD-10-CM | POA: Diagnosis not present

## 2016-01-24 DIAGNOSIS — I10 Essential (primary) hypertension: Secondary | ICD-10-CM | POA: Diagnosis not present

## 2016-01-24 DIAGNOSIS — Z4789 Encounter for other orthopedic aftercare: Secondary | ICD-10-CM | POA: Diagnosis not present

## 2016-01-25 DIAGNOSIS — F329 Major depressive disorder, single episode, unspecified: Secondary | ICD-10-CM | POA: Diagnosis not present

## 2016-01-25 DIAGNOSIS — Z4789 Encounter for other orthopedic aftercare: Secondary | ICD-10-CM | POA: Diagnosis not present

## 2016-01-25 DIAGNOSIS — I10 Essential (primary) hypertension: Secondary | ICD-10-CM | POA: Diagnosis not present

## 2016-01-25 DIAGNOSIS — F411 Generalized anxiety disorder: Secondary | ICD-10-CM | POA: Diagnosis not present

## 2016-01-25 DIAGNOSIS — R2689 Other abnormalities of gait and mobility: Secondary | ICD-10-CM | POA: Diagnosis not present

## 2016-01-25 DIAGNOSIS — Z7901 Long term (current) use of anticoagulants: Secondary | ICD-10-CM | POA: Diagnosis not present

## 2016-01-30 DIAGNOSIS — Z4789 Encounter for other orthopedic aftercare: Secondary | ICD-10-CM | POA: Diagnosis not present

## 2016-01-30 DIAGNOSIS — Z7901 Long term (current) use of anticoagulants: Secondary | ICD-10-CM | POA: Diagnosis not present

## 2016-01-30 DIAGNOSIS — R2689 Other abnormalities of gait and mobility: Secondary | ICD-10-CM | POA: Diagnosis not present

## 2016-01-30 DIAGNOSIS — F411 Generalized anxiety disorder: Secondary | ICD-10-CM | POA: Diagnosis not present

## 2016-01-30 DIAGNOSIS — I10 Essential (primary) hypertension: Secondary | ICD-10-CM | POA: Diagnosis not present

## 2016-01-30 DIAGNOSIS — F329 Major depressive disorder, single episode, unspecified: Secondary | ICD-10-CM | POA: Diagnosis not present

## 2016-02-01 DIAGNOSIS — F411 Generalized anxiety disorder: Secondary | ICD-10-CM | POA: Diagnosis not present

## 2016-02-01 DIAGNOSIS — R2689 Other abnormalities of gait and mobility: Secondary | ICD-10-CM | POA: Diagnosis not present

## 2016-02-01 DIAGNOSIS — Z7901 Long term (current) use of anticoagulants: Secondary | ICD-10-CM | POA: Diagnosis not present

## 2016-02-01 DIAGNOSIS — I10 Essential (primary) hypertension: Secondary | ICD-10-CM | POA: Diagnosis not present

## 2016-02-01 DIAGNOSIS — F329 Major depressive disorder, single episode, unspecified: Secondary | ICD-10-CM | POA: Diagnosis not present

## 2016-02-01 DIAGNOSIS — Z4789 Encounter for other orthopedic aftercare: Secondary | ICD-10-CM | POA: Diagnosis not present

## 2016-02-05 DIAGNOSIS — Z7901 Long term (current) use of anticoagulants: Secondary | ICD-10-CM | POA: Diagnosis not present

## 2016-02-05 DIAGNOSIS — Z4789 Encounter for other orthopedic aftercare: Secondary | ICD-10-CM | POA: Diagnosis not present

## 2016-02-05 DIAGNOSIS — I10 Essential (primary) hypertension: Secondary | ICD-10-CM | POA: Diagnosis not present

## 2016-02-05 DIAGNOSIS — R2689 Other abnormalities of gait and mobility: Secondary | ICD-10-CM | POA: Diagnosis not present

## 2016-02-05 DIAGNOSIS — F411 Generalized anxiety disorder: Secondary | ICD-10-CM | POA: Diagnosis not present

## 2016-02-05 DIAGNOSIS — F329 Major depressive disorder, single episode, unspecified: Secondary | ICD-10-CM | POA: Diagnosis not present

## 2016-02-08 DIAGNOSIS — Z4789 Encounter for other orthopedic aftercare: Secondary | ICD-10-CM | POA: Diagnosis not present

## 2016-02-08 DIAGNOSIS — F329 Major depressive disorder, single episode, unspecified: Secondary | ICD-10-CM | POA: Diagnosis not present

## 2016-02-08 DIAGNOSIS — Z7901 Long term (current) use of anticoagulants: Secondary | ICD-10-CM | POA: Diagnosis not present

## 2016-02-08 DIAGNOSIS — I10 Essential (primary) hypertension: Secondary | ICD-10-CM | POA: Diagnosis not present

## 2016-02-08 DIAGNOSIS — R2689 Other abnormalities of gait and mobility: Secondary | ICD-10-CM | POA: Diagnosis not present

## 2016-02-08 DIAGNOSIS — F411 Generalized anxiety disorder: Secondary | ICD-10-CM | POA: Diagnosis not present

## 2016-02-14 DIAGNOSIS — F411 Generalized anxiety disorder: Secondary | ICD-10-CM | POA: Diagnosis not present

## 2016-02-14 DIAGNOSIS — I10 Essential (primary) hypertension: Secondary | ICD-10-CM | POA: Diagnosis not present

## 2016-02-14 DIAGNOSIS — Z4789 Encounter for other orthopedic aftercare: Secondary | ICD-10-CM | POA: Diagnosis not present

## 2016-02-14 DIAGNOSIS — Z7901 Long term (current) use of anticoagulants: Secondary | ICD-10-CM | POA: Diagnosis not present

## 2016-02-14 DIAGNOSIS — R2689 Other abnormalities of gait and mobility: Secondary | ICD-10-CM | POA: Diagnosis not present

## 2016-02-14 DIAGNOSIS — F329 Major depressive disorder, single episode, unspecified: Secondary | ICD-10-CM | POA: Diagnosis not present

## 2016-02-15 DIAGNOSIS — F411 Generalized anxiety disorder: Secondary | ICD-10-CM | POA: Diagnosis not present

## 2016-02-15 DIAGNOSIS — Z4789 Encounter for other orthopedic aftercare: Secondary | ICD-10-CM | POA: Diagnosis not present

## 2016-02-15 DIAGNOSIS — Z7901 Long term (current) use of anticoagulants: Secondary | ICD-10-CM | POA: Diagnosis not present

## 2016-02-15 DIAGNOSIS — F329 Major depressive disorder, single episode, unspecified: Secondary | ICD-10-CM | POA: Diagnosis not present

## 2016-02-15 DIAGNOSIS — I10 Essential (primary) hypertension: Secondary | ICD-10-CM | POA: Diagnosis not present

## 2016-02-15 DIAGNOSIS — R2689 Other abnormalities of gait and mobility: Secondary | ICD-10-CM | POA: Diagnosis not present

## 2016-02-16 ENCOUNTER — Ambulatory Visit: Payer: Medicare Other | Admitting: Physician Assistant

## 2016-02-16 DIAGNOSIS — F411 Generalized anxiety disorder: Secondary | ICD-10-CM | POA: Diagnosis not present

## 2016-02-16 DIAGNOSIS — I10 Essential (primary) hypertension: Secondary | ICD-10-CM | POA: Diagnosis not present

## 2016-02-16 DIAGNOSIS — F329 Major depressive disorder, single episode, unspecified: Secondary | ICD-10-CM | POA: Diagnosis not present

## 2016-02-16 DIAGNOSIS — Z7901 Long term (current) use of anticoagulants: Secondary | ICD-10-CM | POA: Diagnosis not present

## 2016-02-16 DIAGNOSIS — Z4789 Encounter for other orthopedic aftercare: Secondary | ICD-10-CM | POA: Diagnosis not present

## 2016-02-16 DIAGNOSIS — R2689 Other abnormalities of gait and mobility: Secondary | ICD-10-CM | POA: Diagnosis not present

## 2016-02-20 DIAGNOSIS — R2689 Other abnormalities of gait and mobility: Secondary | ICD-10-CM | POA: Diagnosis not present

## 2016-02-20 DIAGNOSIS — F329 Major depressive disorder, single episode, unspecified: Secondary | ICD-10-CM | POA: Diagnosis not present

## 2016-02-20 DIAGNOSIS — F411 Generalized anxiety disorder: Secondary | ICD-10-CM | POA: Diagnosis not present

## 2016-02-20 DIAGNOSIS — Z7901 Long term (current) use of anticoagulants: Secondary | ICD-10-CM | POA: Diagnosis not present

## 2016-02-20 DIAGNOSIS — Z4789 Encounter for other orthopedic aftercare: Secondary | ICD-10-CM | POA: Diagnosis not present

## 2016-02-20 DIAGNOSIS — I10 Essential (primary) hypertension: Secondary | ICD-10-CM | POA: Diagnosis not present

## 2016-02-21 ENCOUNTER — Ambulatory Visit: Payer: Medicare Other | Admitting: Physician Assistant

## 2016-02-22 DIAGNOSIS — I10 Essential (primary) hypertension: Secondary | ICD-10-CM | POA: Diagnosis not present

## 2016-02-22 DIAGNOSIS — Z7901 Long term (current) use of anticoagulants: Secondary | ICD-10-CM | POA: Diagnosis not present

## 2016-02-22 DIAGNOSIS — R2689 Other abnormalities of gait and mobility: Secondary | ICD-10-CM | POA: Diagnosis not present

## 2016-02-22 DIAGNOSIS — Z4789 Encounter for other orthopedic aftercare: Secondary | ICD-10-CM | POA: Diagnosis not present

## 2016-02-22 DIAGNOSIS — F329 Major depressive disorder, single episode, unspecified: Secondary | ICD-10-CM | POA: Diagnosis not present

## 2016-02-22 DIAGNOSIS — F411 Generalized anxiety disorder: Secondary | ICD-10-CM | POA: Diagnosis not present

## 2016-02-23 ENCOUNTER — Encounter: Payer: Self-pay | Admitting: Physician Assistant

## 2016-02-23 ENCOUNTER — Ambulatory Visit (INDEPENDENT_AMBULATORY_CARE_PROVIDER_SITE_OTHER): Payer: Medicare Other | Admitting: Physician Assistant

## 2016-02-23 VITALS — BP 102/65 | HR 55 | Temp 97.4°F | Ht 64.0 in | Wt 220.0 lb

## 2016-02-23 DIAGNOSIS — Z981 Arthrodesis status: Secondary | ICD-10-CM | POA: Diagnosis not present

## 2016-02-23 DIAGNOSIS — B372 Candidiasis of skin and nail: Secondary | ICD-10-CM

## 2016-02-23 DIAGNOSIS — I2699 Other pulmonary embolism without acute cor pulmonale: Secondary | ICD-10-CM

## 2016-02-23 DIAGNOSIS — L200 Besnier's prurigo: Secondary | ICD-10-CM

## 2016-02-23 DIAGNOSIS — L239 Allergic contact dermatitis, unspecified cause: Secondary | ICD-10-CM

## 2016-02-23 MED ORDER — FLUCONAZOLE 150 MG PO TABS: 150.0000 mg | ORAL_TABLET | Freq: Once | ORAL | 2 refills | Status: AC

## 2016-02-23 MED ORDER — OMEPRAZOLE-SODIUM BICARBONATE 40-1100 MG PO CAPS: 1.0000 | ORAL_CAPSULE | Freq: Every day | ORAL | Status: DC

## 2016-02-23 MED ORDER — PREDNISONE 10 MG (21) PO TBPK: ORAL_TABLET | ORAL | 1 refills | Status: DC

## 2016-02-23 MED ORDER — CETIRIZINE HCL 10 MG PO TABS: 10.0000 mg | ORAL_TABLET | Freq: Every day | ORAL | 11 refills | Status: DC

## 2016-02-23 NOTE — Progress Notes (Signed)
BP 102/65   Pulse (!) 55   Temp 97.4 F (36.3 C) (Oral)   Ht '5\' 4"'$  (1.626 m)   Wt 220 lb (99.8 kg)   BMI 37.76 kg/m    Subjective:    Patient ID: [name], female    DOB: [birth year], 70 y.o.   MRN: [id number]  [name] is a 70 y.o. female presenting on 02/23/2016 for Follow-up (4 week follow up from seeing Evette Doffing )  HPI patient comes in for recheck on her conditions and blood pressure. She is doing quite well overall. She is doing physical therapy in her home. She was given exercises to continue doing even after the therapists are gone. She saw the physician assistant with Dr. Carloyn Manner yesterday. She states that she is recovering well from her back surgery. She is having increase tinea in the folds of her skin, and some allergic dermatitis that she has had in the past. We will start some medication for this. She also had a history of a pulmonary embolism after her surgery. It is recommended that she continue on the Xarelto for about 6 months from the time of the PE. This will be in late January when we can look at stopping that medication. All of her other medications are reviewed and refilled if needed.   Relevant past medical, surgical, family and social history reviewed and updated as indicated. Interim medical history since our last visit reviewed. Allergies and medications reviewed and updated.   Data reviewed from any sources in EPIC.  Review of Systems  Per HPI unless specifically indicated above  Social History   Social History  . Marital status: Single    Spouse name: N/A  . Number of children: 2  . Years of education: N/A   Occupational History  . Not on file.   Social History Main Topics  . Smoking status: Never Smoker  . Smokeless tobacco: Never Used  . Alcohol use No  . Drug use: No  . Sexual activity: Not on file   Other Topics Concern  . Not on file   Social History Narrative  . No narrative on file    Past Surgical History:  Procedure Laterality  Date  . ABDOMINAL HYSTERECTOMY    . CHOLECYSTECTOMY N/A 11/04/2013   Procedure: LAPAROSCOPIC CHOLECYSTECTOMY WITH INTRAOPERATIVE CHOLANGIOGRAM;  Surgeon: Imogene Burn. Georgette Dover, MD;  Location: [facility];  Service: General;  Laterality: N/A;  . COLONOSCOPY    . FRACTURE SURGERY     pins removed from Hip surgery  . HIP FRACTURE SURGERY  1990    pins removed in 1991  . PARTIAL HYSTERECTOMY  1987  . SPINAL FUSION    . UPPER GASTROINTESTINAL ENDOSCOPY      Family History  Problem Relation Age of Onset  . Arthritis    . Heart disease    . Cancer    . Diabetes    . OCD        Medication List       Accurate as of 02/23/16 12:07 PM. Always use your most recent med list.          acetaminophen 650 MG CR tablet Commonly known as:  TYLENOL Take 650 mg by mouth every 8 (eight) hours as needed for pain.   amLODipine 5 MG tablet Commonly known as:  NORVASC Take 1 tablet (5 mg total) by mouth daily.   calcium carbonate 1250 (500 Ca) MG tablet Commonly known as:  OS-CAL - dosed in mg of elemental  calcium Take 1 tablet by mouth.   cetirizine 10 MG tablet Commonly known as:  ZYRTEC Take 1 tablet (10 mg total) by mouth daily. Take for rash   cholecalciferol 1000 units tablet Commonly known as:  VITAMIN D Take 1,000 Units by mouth daily.   fluconazole 150 MG tablet Commonly known as:  DIFLUCAN Take 1 tablet (150 mg total) by mouth once.   fluticasone 50 MCG/ACT nasal spray Commonly known as:  FLONASE Place into both nostrils daily.   hydrochlorothiazide 25 MG tablet Commonly known as:  HYDRODIURIL Take 1 tablet (25 mg total) by mouth daily.   levothyroxine 50 MCG tablet Commonly known as:  SYNTHROID, LEVOTHROID Take 1 tablet (50 mcg total) by mouth daily.   losartan 100 MG tablet Commonly known as:  COZAAR Take 1 tablet (100 mg total) by mouth daily.   multivitamin tablet Take 1 tablet by mouth daily.   omeprazole-sodium bicarbonate 40-1100 MG capsule Commonly known as:   ZEGERID Take 1 capsule by mouth daily before breakfast.   oxyCODONE-acetaminophen 5-325 MG tablet Commonly known as:  PERCOCET/ROXICET Take 1 tablet by mouth every 6 (six) hours as needed.   predniSONE 10 MG (21) Tbpk tablet Commonly known as:  STERAPRED UNI-PAK 21 TAB As directed x 6 days   saccharomyces boulardii 250 MG capsule Commonly known as:  FLORASTOR Take 250 mg by mouth 2 (two) times daily.   sertraline 100 MG tablet Commonly known as:  ZOLOFT Take 1 tablet (100 mg total) by mouth daily.   vitamin C 500 MG tablet Commonly known as:  ASCORBIC ACID Take 500 mg by mouth daily.   XARELTO 20 MG Tabs tablet Generic drug:  rivaroxaban Take 1 tablet (20 mg total) by mouth daily.          Objective:    BP 102/65   Pulse (!) 55   Temp 97.4 F (36.3 C) (Oral)   Ht '5\' 4"'$  (1.626 m)   Wt 220 lb (99.8 kg)   BMI 37.76 kg/m   Allergies  Allergen Reactions  . Celebrex [Celecoxib] Swelling  . Keflet [Cephalexin] Swelling  . Kenalog [Triamcinolone Acetonide]     unknown  . Lisinopril Other (See Comments)    unknown  . Penicillins Hives and Swelling  . Sulfa Antibiotics Swelling   Wt Readings from Last 3 Encounters:  02/23/16 220 lb (99.8 kg)  01/18/16 220 lb (99.8 kg)  11/19/13 231 lb 6.4 oz (105 kg)    Physical Exam  Results for orders placed or performed in visit on 01/18/16  [id number] [facility]  Result Value Ref Range   Glucose 95 65 - 99 mg/dL   BUN 18 8 - 27 mg/dL   Creatinine, Ser 1.23 (H) 0.57 - 1.00 mg/dL   GFR calc non Af Amer 45 (L) >59 mL/min/1.73   GFR calc Af Amer 52 (L) >59 mL/min/1.73   BUN/Creatinine Ratio 15 12 - 28   Sodium 139 134 - 144 mmol/L   Potassium 3.8 3.5 - 5.2 mmol/L   Chloride 94 (L) 96 - 106 mmol/L   CO2 29 18 - 29 mmol/L   Calcium 9.7 8.7 - 10.3 mg/dL  TSH  Result Value Ref Range   TSH 2.120 0.450 - 4.500 uIU/mL  Lipid panel  Result Value Ref Range   Cholesterol, Total 182 100 - 199 mg/dL   Triglycerides 166 (H) 0 - 149 mg/dL     HDL 50 >39 mg/dL   VLDL Cholesterol Cal 33 5 - 40 mg/dL  LDL Calculated 99 0 - 99 mg/dL   Chol/HDL Ratio 3.6 0.0 - 4.4 ratio units      Assessment & Plan:   1. Other pulmonary embolism without acute cor pulmonale, unspecified chronicity ([place])  2. H/O spinal fusion  3. Allergic dermatitis - cetirizine (ZYRTEC) 10 MG tablet; Take 1 tablet (10 mg total) by mouth daily. Take for rash  Dispense: 30 tablet; Refill: 11 - predniSONE (STERAPRED UNI-PAK 21 TAB) 10 MG (21) TBPK tablet; As directed x 6 days  Dispense: 21 tablet; Refill: 1  4. Candidiasis of skin - fluconazole (DIFLUCAN) 150 MG tablet; Take 1 tablet (150 mg total) by mouth once.  Dispense: 5 tablet; Refill: 2   Continue all other maintenance medications as listed above. Educational handout given for hives.  Follow up plan: Return in about 2 months (around 04/24/2016) for recheck.  Terald Sleeper PA-C [place] [address]  [place], [place] [address] [phone]   02/23/2016, 12:07 PM

## 2016-02-23 NOTE — Patient Instructions (Addendum)
Hives Hives are itchy, red, swollen areas of the skin. They can vary in size and location on your body. Hives can come and go for hours or several days (acute hives) or for several weeks (chronic hives). Hives do not spread from person to person (noncontagious). They may get worse with scratching, exercise, and emotional stress. CAUSES   Allergic reaction to food, additives, or drugs.  Infections, including the common cold.  Illness, such as vasculitis, lupus, or thyroid disease.  Exposure to sunlight, heat, or cold.  Exercise.  Stress.  Contact with chemicals. SYMPTOMS   Red or white swollen patches on the skin. The patches may change size, shape, and location quickly and repeatedly.  Itching.  Swelling of the hands, feet, and face. This may occur if hives develop deeper in the skin. DIAGNOSIS  Your caregiver can usually tell what is wrong by performing a physical exam. Skin or blood tests may also be done to determine the cause of your hives. In some cases, the cause cannot be determined. TREATMENT  Mild cases usually get better with medicines such as antihistamines. Severe cases may require an emergency epinephrine injection. If the cause of your hives is known, treatment includes avoiding that trigger.  HOME CARE INSTRUCTIONS   Avoid causes that trigger your hives.  Take antihistamines as directed by your caregiver to reduce the severity of your hives. Non-sedating or low-sedating antihistamines are usually recommended. Do not drive while taking an antihistamine.  Take any other medicines prescribed for itching as directed by your caregiver.  Wear loose-fitting clothing.  Keep all follow-up appointments as directed by your caregiver. SEEK MEDICAL CARE IF:   You have persistent or severe itching that is not relieved with medicine.  You have painful or swollen joints. SEEK IMMEDIATE MEDICAL CARE IF:   You have a fever.  Your tongue or lips are swollen.  You have  trouble breathing or swallowing.  You feel tightness in the throat or chest.  You have abdominal pain. These problems may be the first sign of a life-threatening allergic reaction. Call your local emergency services (911 in [place] MAKE SURE YOU:   Understand these instructions.  Will watch your condition.  Will get help right away if you are not doing well or get worse.   This information is not intended to replace advice given to you by your health care provider. Make sure you discuss any questions you have with your health care provider.   Document Released: 05/13/2005 Document Revised: 05/18/2013 Document Reviewed: 08/06/2011 Elsevier Interactive Patient Education 2016 [employer]. hives

## 2016-03-06 ENCOUNTER — Other Ambulatory Visit: Payer: Self-pay | Admitting: *Deleted

## 2016-03-06 ENCOUNTER — Telehealth: Payer: Self-pay | Admitting: Physician Assistant

## 2016-03-06 MED ORDER — PRAVASTATIN SODIUM 40 MG PO TABS: 40.0000 mg | ORAL_TABLET | Freq: Every day | ORAL | 3 refills | Status: DC

## 2016-03-06 NOTE — Telephone Encounter (Signed)
FYI      Aware of lab results from august.  She is willing to start cholesterol medications and script sent in. Patient was advised to elevate her feet and keep a weight journal to prove any gains in fluid.  She will contact us if she determines that her weight is increasing or things are not improving. She was advised, provider may require an office visit or increase her fluid pill.    Provider will advise for any further instructions.

## 2016-03-06 NOTE — Telephone Encounter (Signed)
Noted and agree. 

## 2016-04-04 ENCOUNTER — Telehealth: Payer: Self-pay | Admitting: Physician Assistant

## 2016-04-04 NOTE — Telephone Encounter (Signed)
Pt notified will need to be seen Will call back to schedule

## 2016-04-22 ENCOUNTER — Other Ambulatory Visit: Payer: Self-pay | Admitting: Physician Assistant

## 2016-04-22 DIAGNOSIS — I2699 Other pulmonary embolism without acute cor pulmonale: Secondary | ICD-10-CM

## 2016-05-14 ENCOUNTER — Other Ambulatory Visit: Payer: Self-pay | Admitting: Pediatrics

## 2016-05-14 ENCOUNTER — Other Ambulatory Visit: Payer: Self-pay | Admitting: Physician Assistant

## 2016-05-14 DIAGNOSIS — I2699 Other pulmonary embolism without acute cor pulmonale: Secondary | ICD-10-CM

## 2016-05-14 DIAGNOSIS — I10 Essential (primary) hypertension: Secondary | ICD-10-CM

## 2016-05-14 MED ORDER — AMLODIPINE BESYLATE 5 MG PO TABS: 5.0000 mg | ORAL_TABLET | Freq: Every day | ORAL | 11 refills | Status: DC

## 2016-05-14 MED ORDER — HYDROCHLOROTHIAZIDE 25 MG PO TABS: 25.0000 mg | ORAL_TABLET | Freq: Every day | ORAL | 11 refills | Status: DC

## 2016-05-14 MED ORDER — LEVOTHYROXINE SODIUM 50 MCG PO TABS: 50.0000 ug | ORAL_TABLET | Freq: Every day | ORAL | 11 refills | Status: DC

## 2016-05-14 MED ORDER — LOSARTAN POTASSIUM 100 MG PO TABS: 100.0000 mg | ORAL_TABLET | Freq: Every day | ORAL | 11 refills | Status: DC

## 2016-05-14 MED ORDER — RIVAROXABAN 20 MG PO TABS: ORAL_TABLET | ORAL | 11 refills | Status: DC

## 2016-05-14 NOTE — Telephone Encounter (Signed)
Patient needs refills of Norvasc, Hctz, Synthroid, Losartan and Xarelto sent to [facility].  Patient last seen 01/23/2016.  Please advise and route to pools.

## 2016-05-14 NOTE — Telephone Encounter (Signed)
Prescription sent to pharmacy.

## 2016-05-15 NOTE — Telephone Encounter (Signed)
Tried to contact patient. No answer/ no VM.

## 2016-05-15 NOTE — Telephone Encounter (Signed)
Patient aware.

## 2016-05-31 ENCOUNTER — Ambulatory Visit: Payer: Medicare Other | Admitting: Physician Assistant

## 2016-06-06 DIAGNOSIS — M4716 Other spondylosis with myelopathy, lumbar region: Secondary | ICD-10-CM | POA: Diagnosis not present

## 2016-06-10 ENCOUNTER — Ambulatory Visit: Payer: Medicare Other | Admitting: Physician Assistant

## 2016-06-10 ENCOUNTER — Other Ambulatory Visit: Payer: Self-pay | Admitting: Nurse Practitioner

## 2016-06-10 DIAGNOSIS — M4716 Other spondylosis with myelopathy, lumbar region: Secondary | ICD-10-CM

## 2016-06-20 ENCOUNTER — Ambulatory Visit
Admission: RE | Admit: 2016-06-20 | Discharge: 2016-06-20 | Disposition: A | Discharge status: Home / Self Care | Payer: Medicare Other | Source: Ambulatory Visit | Attending: Nurse Practitioner | Admitting: Nurse Practitioner

## 2016-06-20 DIAGNOSIS — M4716 Other spondylosis with myelopathy, lumbar region: Secondary | ICD-10-CM

## 2016-06-20 DIAGNOSIS — M48061 Spinal stenosis, lumbar region without neurogenic claudication: Secondary | ICD-10-CM | POA: Diagnosis not present

## 2016-06-20 MED ORDER — GADOBENATE DIMEGLUMINE 529 MG/ML IV SOLN: 13.0000 mL | Freq: Once | INTRAVENOUS | Status: DC | PRN

## 2016-06-21 ENCOUNTER — Telehealth: Payer: Self-pay | Admitting: Physician Assistant

## 2016-06-21 NOTE — Telephone Encounter (Signed)
continue one more month

## 2016-06-25 NOTE — Telephone Encounter (Signed)
Pt aware to continue xarelto for one more month

## 2016-07-04 ENCOUNTER — Telehealth: Payer: Self-pay | Admitting: Physician Assistant

## 2016-07-04 NOTE — Telephone Encounter (Signed)
Patient aware and appointment given for Tuesday @ 11:10am with Glenard Haring.

## 2016-07-04 NOTE — Telephone Encounter (Signed)
Attempted to contact patient- NA/ mailbox full.

## 2016-07-04 NOTE — Telephone Encounter (Signed)
Covering for PCP  WOuld recommend follow up with PCP to discuss.   Laroy Apple, MD [facility] 07/04/2016, 1:51 PM

## 2016-07-09 ENCOUNTER — Ambulatory Visit (INDEPENDENT_AMBULATORY_CARE_PROVIDER_SITE_OTHER): Payer: Medicare Other | Admitting: Physician Assistant

## 2016-07-09 ENCOUNTER — Encounter: Payer: Self-pay | Admitting: Physician Assistant

## 2016-07-09 VITALS — BP 123/72 | HR 64 | Temp 97.5°F | Ht 64.0 in | Wt 236.0 lb

## 2016-07-09 DIAGNOSIS — Z981 Arthrodesis status: Secondary | ICD-10-CM | POA: Diagnosis not present

## 2016-07-09 DIAGNOSIS — F332 Major depressive disorder, recurrent severe without psychotic features: Secondary | ICD-10-CM

## 2016-07-09 DIAGNOSIS — E7800 Pure hypercholesterolemia, unspecified: Secondary | ICD-10-CM

## 2016-07-09 DIAGNOSIS — E039 Hypothyroidism, unspecified: Secondary | ICD-10-CM | POA: Diagnosis not present

## 2016-07-09 DIAGNOSIS — Z86711 Personal history of pulmonary embolism: Secondary | ICD-10-CM

## 2016-07-09 DIAGNOSIS — I10 Essential (primary) hypertension: Secondary | ICD-10-CM | POA: Diagnosis not present

## 2016-07-09 MED ORDER — PRAVASTATIN SODIUM 40 MG PO TABS: 40.0000 mg | ORAL_TABLET | Freq: Every day | ORAL | 3 refills | Status: DC

## 2016-07-09 MED ORDER — SERTRALINE HCL 100 MG PO TABS: 200.0000 mg | ORAL_TABLET | Freq: Every day | ORAL | 11 refills | Status: DC

## 2016-07-09 MED ORDER — PREDNISONE 10 MG (21) PO TBPK: ORAL_TABLET | ORAL | 0 refills | Status: DC

## 2016-07-09 MED ORDER — GABAPENTIN 100 MG PO CAPS: 100.0000 mg | ORAL_CAPSULE | Freq: Three times a day (TID) | ORAL | 3 refills | Status: DC

## 2016-07-09 MED ORDER — CLOBETASOL PROPIONATE 0.05 % EX CREA: 1.0000 "application " | TOPICAL_CREAM | Freq: Two times a day (BID) | CUTANEOUS | 6 refills | Status: DC

## 2016-07-09 MED ORDER — FLUTICASONE PROPIONATE 50 MCG/ACT NA SUSP: 2.0000 | Freq: Every day | NASAL | 11 refills | Status: DC

## 2016-07-09 MED ORDER — ASPIRIN 81 MG PO TBEC: 81.0000 mg | DELAYED_RELEASE_TABLET | Freq: Every day | ORAL | 12 refills | Status: DC

## 2016-07-09 NOTE — Patient Instructions (Signed)
Neuropathic Pain Introduction Neuropathic pain is pain caused by damage to the nerves that are responsible for certain sensations in your body (sensory nerves). The pain can be caused by damage to:  The sensory nerves that send signals to your spinal cord and brain (peripheral nervous system).  The sensory nerves in your brain or spinal cord (central nervous system). Neuropathic pain can make you more sensitive to pain. What would be a minor sensation for most people may feel very painful if you have neuropathic pain. This is usually a long-term condition that can be difficult to treat. The type of pain can differ from person to person. It may start suddenly (acute), or it may develop slowly and last for a long time (chronic). Neuropathic pain may come and go as damaged nerves heal or may stay at the same level for years. It often causes emotional distress, loss of sleep, and a lower quality of life. What are the causes? The most common cause of damage to a sensory nerve is diabetes. Many other diseases and conditions can also cause neuropathic pain. Causes of neuropathic pain can be classified as:  Toxic. Many drugs and chemicals can cause toxic damage. The most common cause of toxic neuropathic pain is damage from drug treatment for cancer (chemotherapy).  Metabolic. This type of pain can happen when a disease causes imbalances that damage nerves. Diabetes is the most common of these diseases. Vitamin B deficiency caused by long-term alcohol abuse is another common cause.  Traumatic. Any injury that cuts, crushes, or stretches a nerve can cause damage and pain. A common example is feeling pain after losing an arm or leg (phantom limb pain).  Compression-related. If a sensory nerve gets trapped or compressed for a long period of time, the blood supply to the nerve can be cut off.  Vascular. Many blood vessel diseases can cause neuropathic pain by decreasing blood supply and oxygen to  nerves.  Autoimmune. This type of pain results from diseases in which the body's defense system mistakenly attacks sensory nerves. Examples of autoimmune diseases that can cause neuropathic pain include lupus and multiple sclerosis.  Infectious. Many types of viral infections can damage sensory nerves and cause pain. Shingles infection is a common cause of this type of pain.  Inherited. Neuropathic pain can be a symptom of many diseases that are passed down through families (genetic). What are the signs or symptoms? The main symptom is pain. Neuropathic pain is often described as:  Burning.  Shock-like.  Stinging.  Hot or cold.  Itching. How is this diagnosed? No single test can diagnose neuropathic pain. Your health care provider will do a physical exam and ask you about your pain. You may use a pain scale to describe how bad your pain is. You may also have tests to see if you have a high sensitivity to pain and to help find the cause and location of any sensory nerve damage. These tests may include:  Imaging studies, such as:  X-rays.  CT scan.  MRI.  Nerve conduction studies to test how well nerve signals travel through your sensory nerves (electrodiagnostic testing).  Stimulating your sensory nerves through electrodes on your skin and measuring the response in your spinal cord and brain (somatosensory evoked potentials). How is this treated? Treatment for neuropathic pain may change over time. You may need to try different treatment options or a combination of treatments. Some options include:  Over-the-counter pain relievers.  Prescription medicines. Some medicines used to treat   other conditions may also help neuropathic pain. These include medicines to:  Control seizures (anticonvulsants).  Relieve depression (antidepressants).  Prescription-strength pain relievers (narcotics). These are usually used when other pain relievers do not help.  Transcutaneous nerve  stimulation (TENS). This uses electrical currents to block painful nerve signals. The treatment is painless.  Topical and local anesthetics. These are medicines that numb the nerves. They can be injected as a nerve block or applied to the skin.  Alternative treatments, such as:  Acupuncture.  Meditation.  Massage.  Physical therapy.  Pain management programs.  Counseling. Follow these instructions at home:  Learn as much as you can about your condition.  Take medicines only as directed by your health care provider.  Work closely with all your health care providers to find what works best for you.  Have a good support system at home.  Consider joining a chronic pain support group. Contact a health care provider if:  Your pain treatments are not helping.  You are having side effects from your medicines.  You are struggling with fatigue, mood changes, depression, or anxiety. This information is not intended to replace advice given to you by your health care provider. Make sure you discuss any questions you have with your health care provider. Document Released: 02/08/2004 Document Revised: 12/01/2015 Document Reviewed: 10/21/2013  2017 Elsevier  

## 2016-07-10 LAB — CMP14+EGFR
ALBUMIN: 4 g/dL (ref 3.6–4.8)
ALT: 11 IU/L (ref 0–32)
AST: 16 IU/L (ref 0–40)
Albumin/Globulin Ratio: 1.4 (ref 1.2–2.2)
Alkaline Phosphatase: 102 IU/L (ref 39–117)
BUN/Creatinine Ratio: 17 (ref 12–28)
BUN: 18 mg/dL (ref 8–27)
Bilirubin Total: 0.5 mg/dL (ref 0.0–1.2)
CO2: 27 mmol/L (ref 18–29)
CREATININE: 1.05 mg/dL — AB (ref 0.57–1.00)
Calcium: 9.5 mg/dL (ref 8.7–10.3)
Chloride: 98 mmol/L (ref 96–106)
GFR, EST AFRICAN AMERICAN: 63 mL/min/{1.73_m2} (ref >59)
GFR, EST NON AFRICAN AMERICAN: 54 mL/min/{1.73_m2} — AB (ref >59)
GLOBULIN, TOTAL: 2.8 g/dL (ref 1.5–4.5)
Glucose: 88 mg/dL (ref 65–99)
POTASSIUM: 3.9 mmol/L (ref 3.5–5.2)
SODIUM: 140 mmol/L (ref 134–144)
TOTAL PROTEIN: 6.8 g/dL (ref 6.0–8.5)

## 2016-07-10 LAB — THYROID PANEL WITH TSH
Free Thyroxine Index: 2.4 (ref 1.2–4.9)
T3 UPTAKE RATIO: 24 % (ref 24–39)
T4 TOTAL: 10 ug/dL (ref 4.5–12.0)
TSH: 3.03 u[IU]/mL (ref 0.450–4.500)

## 2016-07-10 LAB — LIPID PANEL
CHOL/HDL RATIO: 3.3 ratio (ref 0.0–4.4)
Cholesterol, Total: 161 mg/dL (ref 100–199)
HDL: 49 mg/dL (ref >39)
LDL CALC: 79 mg/dL (ref 0–99)
Triglycerides: 163 mg/dL — ABNORMAL HIGH (ref 0–149)
VLDL Cholesterol Cal: 33 mg/dL (ref 5–40)

## 2016-07-11 DIAGNOSIS — M4716 Other spondylosis with myelopathy, lumbar region: Secondary | ICD-10-CM | POA: Diagnosis not present

## 2016-07-11 DIAGNOSIS — M4317 Spondylolisthesis, lumbosacral region: Secondary | ICD-10-CM | POA: Diagnosis not present

## 2016-07-11 NOTE — Progress Notes (Signed)
BP 123/72   Pulse 64   Temp 97.5 F (36.4 C) (Oral)   Ht '5\' 4"'$  (1.626 m)   Wt 236 lb (107 kg)   BMI 40.51 kg/m    Subjective:    Patient ID: [name], female    DOB: [birth year], 71 y.o.   MRN: [id number]  HPI: [name] is a 71 y.o. female presenting on 07/09/2016 for Follow-up (labs )  This patient comes in for periodic recheck on medications and conditions. All medications are reviewed today. There are no reports of any problems with the medications. All of the medical conditions are reviewed and updated.  Lab work is reviewed and will be ordered as medically necessary. There are no new problems reported with today's visit.   Past Medical History:  Diagnosis Date  . Allergy   . Bronchitis, chronic ([place])   . Chronic anxiety   . Complication of anesthesia    hard to wake up  . COPD (chronic obstructive pulmonary disease) ([place])   . Depression   . Esophageal reflux   . Headache(784.0)   . Hyperlipidemia   . Hypertension   . Hypothyroidism   . Obesity   . Osteoporosis   . Overactive bladder   . PVC's (premature ventricular contractions)   . Thyroid disease   . Vitamin D deficiency disease    Relevant past medical, surgical, family and social history reviewed and updated as indicated. Interim medical history since our last visit reviewed. Allergies and medications reviewed and updated. DATA REVIEWED: CHART IN EPIC  Social History   Social History  . Marital status: Single    Spouse name: N/A  . Number of children: 2  . Years of education: N/A   Occupational History  . Not on file.   Social History Main Topics  . Smoking status: Never Smoker  . Smokeless tobacco: Never Used  . Alcohol use No  . Drug use: No  . Sexual activity: Not on file   Other Topics Concern  . Not on file   Social History Narrative  . No narrative on file    Past Surgical History:  Procedure Laterality Date  . ABDOMINAL HYSTERECTOMY    . CHOLECYSTECTOMY N/A 11/04/2013   Procedure: LAPAROSCOPIC CHOLECYSTECTOMY WITH INTRAOPERATIVE CHOLANGIOGRAM;  Surgeon: Imogene Burn. Georgette Dover, MD;  Location: [facility];  Service: General;  Laterality: N/A;  . COLONOSCOPY    . FRACTURE SURGERY     pins removed from Hip surgery  . HIP FRACTURE SURGERY  1990    pins removed in 1991  . PARTIAL HYSTERECTOMY  1987  . SPINAL FUSION    . UPPER GASTROINTESTINAL ENDOSCOPY      Family History  Problem Relation Age of Onset  . Arthritis    . Heart disease    . Cancer    . Diabetes    . OCD      Review of Systems  Constitutional: Positive for fatigue. Negative for activity change, fever and unexpected weight change.  HENT: Negative.   Eyes: Negative.   Respiratory: Negative.  Negative for cough and wheezing.   Cardiovascular: Negative.  Negative for chest pain, palpitations and leg swelling.  Gastrointestinal: Negative.  Negative for abdominal pain.  Endocrine: Negative.   Genitourinary: Negative.  Negative for dysuria.  Musculoskeletal: Positive for arthralgias and joint swelling.  Skin: Negative.   Neurological: Negative.  Negative for dizziness.  Psychiatric/Behavioral: Positive for decreased concentration, dysphoric mood and sleep disturbance. Negative for suicidal ideas. The patient is nervous/anxious.  Allergies as of 07/09/2016      Reactions   Celebrex [celecoxib] Swelling   Keflet [cephalexin] Swelling   Kenalog [triamcinolone Acetonide]    unknown   Lisinopril Other (See Comments)   unknown   Penicillins Hives, Swelling   Sulfa Antibiotics Swelling      Medication List       Accurate as of 07/09/16 11:59 PM. Always use your most recent med list.          acetaminophen 650 MG CR tablet Commonly known as:  TYLENOL Take 650 mg by mouth every 8 (eight) hours as needed for pain.   amLODipine 5 MG tablet Commonly known as:  NORVASC Take 1 tablet (5 mg total) by mouth daily.   aspirin 81 MG EC tablet Take 1 tablet (81 mg total) by mouth daily. Swallow  whole.   calcium carbonate 1250 (500 Ca) MG tablet Commonly known as:  OS-CAL - dosed in mg of elemental calcium Take 1 tablet by mouth.   cetirizine 10 MG tablet Commonly known as:  ZYRTEC Take 1 tablet (10 mg total) by mouth daily. Take for rash   cholecalciferol 1000 units tablet Commonly known as:  VITAMIN D Take 1,000 Units by mouth daily.   clobetasol cream 0.05 % Commonly known as:  TEMOVATE Apply 1 application topically 2 (two) times daily.   fluticasone 50 MCG/ACT nasal spray Commonly known as:  FLONASE Place 2 sprays into both nostrils daily.   gabapentin 100 MG capsule Commonly known as:  NEURONTIN Take 1-3 capsules (100-300 mg total) by mouth 3 (three) times daily.   hydrochlorothiazide 25 MG tablet Commonly known as:  HYDRODIURIL Take 1 tablet (25 mg total) by mouth daily.   levothyroxine 50 MCG tablet Commonly known as:  SYNTHROID, LEVOTHROID Take 1 tablet (50 mcg total) by mouth daily.   losartan 100 MG tablet Commonly known as:  COZAAR Take 1 tablet (100 mg total) by mouth daily.   multivitamin tablet Take 1 tablet by mouth daily.   omeprazole-sodium bicarbonate 40-1100 MG capsule Commonly known as:  ZEGERID Take 1 capsule by mouth daily before breakfast.   oxyCODONE-acetaminophen 5-325 MG tablet Commonly known as:  PERCOCET/ROXICET Take 1 tablet by mouth every 6 (six) hours as needed.   pravastatin 40 MG tablet Commonly known as:  PRAVACHOL Take 1 tablet (40 mg total) by mouth daily.   predniSONE 10 MG (21) Tbpk tablet Commonly known as:  STERAPRED UNI-PAK 21 TAB As directed x 6 days   saccharomyces boulardii 250 MG capsule Commonly known as:  FLORASTOR Take 250 mg by mouth 2 (two) times daily.   sertraline 100 MG tablet Commonly known as:  ZOLOFT Take 2 tablets (200 mg total) by mouth daily.   vitamin C 500 MG tablet Commonly known as:  ASCORBIC ACID Take 500 mg by mouth daily.          Objective:    BP 123/72   Pulse 64    Temp 97.5 F (36.4 C) (Oral)   Ht '5\' 4"'$  (1.626 m)   Wt 236 lb (107 kg)   BMI 40.51 kg/m   Allergies  Allergen Reactions  . Celebrex [Celecoxib] Swelling  . Keflet [Cephalexin] Swelling  . Kenalog [Triamcinolone Acetonide]     unknown  . Lisinopril Other (See Comments)    unknown  . Penicillins Hives and Swelling  . Sulfa Antibiotics Swelling    Wt Readings from Last 3 Encounters:  07/09/16 236 lb (107 kg)  02/23/16 220 lb (  99.8 kg)  01/18/16 220 lb (99.8 kg)    Physical Exam  Constitutional: She is oriented to person, place, and time. She appears well-developed and well-nourished.  HENT:  Head: Normocephalic and atraumatic.  Eyes: Conjunctivae and EOM are normal. Pupils are equal, round, and reactive to light.  Cardiovascular: Normal rate, regular rhythm, normal heart sounds and intact distal pulses.   Pulmonary/Chest: Effort normal and breath sounds normal.  Abdominal: Soft. Bowel sounds are normal.  Neurological: She is alert and oriented to person, place, and time. She has normal reflexes.  Skin: Skin is warm and dry. No rash noted.  Psychiatric: She has a normal mood and affect. Her behavior is normal. Judgment and thought content normal.    Results for orders placed or performed in visit on 07/09/16  CMP14+EGFR  Result Value Ref Range   Glucose 88 65 - 99 mg/dL   BUN 18 8 - 27 mg/dL   Creatinine, Ser 1.05 (H) 0.57 - 1.00 mg/dL   GFR calc non Af Amer 54 (L) >59 mL/min/1.73   GFR calc Af Amer 63 >59 mL/min/1.73   BUN/Creatinine Ratio 17 12 - 28   Sodium 140 134 - 144 mmol/L   Potassium 3.9 3.5 - 5.2 mmol/L   Chloride 98 96 - 106 mmol/L   CO2 27 18 - 29 mmol/L   Calcium 9.5 8.7 - 10.3 mg/dL   Total Protein 6.8 6.0 - 8.5 g/dL   Albumin 4.0 3.6 - 4.8 g/dL   Globulin, Total 2.8 1.5 - 4.5 g/dL   Albumin/Globulin Ratio 1.4 1.2 - 2.2   Bilirubin Total 0.5 0.0 - 1.2 mg/dL   Alkaline Phosphatase 102 39 - 117 IU/L   AST 16 0 - 40 IU/L   ALT 11 0 - 32 IU/L  Lipid  panel  Result Value Ref Range   Cholesterol, Total 161 100 - 199 mg/dL   Triglycerides 163 (H) 0 - 149 mg/dL   HDL 49 >39 mg/dL   VLDL Cholesterol Cal 33 5 - 40 mg/dL   LDL Calculated 79 0 - 99 mg/dL   Chol/HDL Ratio 3.3 0.0 - 4.4 ratio units  Thyroid Panel With TSH  Result Value Ref Range   TSH 3.030 0.450 - 4.500 uIU/mL   T4, Total 10.0 4.5 - 12.0 ug/dL   T3 Uptake Ratio 24 24 - 39 %   Free Thyroxine Index 2.4 1.2 - 4.9      Assessment & Plan:   1. Severe episode of recurrent major depressive disorder, without psychotic features ([place]) - sertraline (ZOLOFT) 100 MG tablet; Take 2 tablets (200 mg total) by mouth daily.  Dispense: 60 tablet; Refill: 11  2. Essential hypertension - CMP14+EGFR - Lipid panel  3. Pure hypercholesterolemia  4. H/O spinal fusion  5. History of pulmonary embolus ([place])  6. Hypothyroidism, unspecified type - Thyroid Panel With TSH   Continue all other maintenance medications as listed above.  Current Outpatient Prescriptions:  .  acetaminophen (TYLENOL) 650 MG CR tablet, Take 650 mg by mouth every 8 (eight) hours as needed for pain., Disp: , Rfl:  .  amLODipine (NORVASC) 5 MG tablet, Take 1 tablet (5 mg total) by mouth daily., Disp: 30 tablet, Rfl: 11 .  aspirin 81 MG EC tablet, Take 1 tablet (81 mg total) by mouth daily. Swallow whole., Disp: 30 tablet, Rfl: 12 .  calcium carbonate (OS-CAL - DOSED IN MG OF ELEMENTAL CALCIUM) 1250 (500 Ca) MG tablet, Take 1 tablet by mouth., Disp: , Rfl:  .  cetirizine (ZYRTEC) 10 MG tablet, Take 1 tablet (10 mg total) by mouth daily. Take for rash, Disp: 30 tablet, Rfl: 11 .  cholecalciferol (VITAMIN D) 1000 units tablet, Take 1,000 Units by mouth daily., Disp: , Rfl:  .  clobetasol cream (TEMOVATE) 7.51 %, Apply 1 application topically 2 (two) times daily., Disp: 60 g, Rfl: 6 .  fluticasone (FLONASE) 50 MCG/ACT nasal spray, Place 2 sprays into both nostrils daily., Disp: 16 g, Rfl: 11 .  gabapentin (NEURONTIN) 100  MG capsule, Take 1-3 capsules (100-300 mg total) by mouth 3 (three) times daily., Disp: 90 capsule, Rfl: 3 .  hydrochlorothiazide (HYDRODIURIL) 25 MG tablet, Take 1 tablet (25 mg total) by mouth daily., Disp: 30 tablet, Rfl: 11 .  levothyroxine (SYNTHROID, LEVOTHROID) 50 MCG tablet, Take 1 tablet (50 mcg total) by mouth daily., Disp: 30 tablet, Rfl: 11 .  losartan (COZAAR) 100 MG tablet, Take 1 tablet (100 mg total) by mouth daily., Disp: 30 tablet, Rfl: 11 .  Multiple Vitamin (MULTIVITAMIN) tablet, Take 1 tablet by mouth daily., Disp: , Rfl:  .  omeprazole-sodium bicarbonate (ZEGERID) 40-1100 MG capsule, Take 1 capsule by mouth daily before breakfast., Disp: , Rfl:  .  oxyCODONE-acetaminophen (PERCOCET/ROXICET) 5-325 MG tablet, Take 1 tablet by mouth every 6 (six) hours as needed., Disp: 30 tablet, Rfl: 0 .  pravastatin (PRAVACHOL) 40 MG tablet, Take 1 tablet (40 mg total) by mouth daily., Disp: 90 tablet, Rfl: 3 .  predniSONE (STERAPRED UNI-PAK 21 TAB) 10 MG (21) TBPK tablet, As directed x 6 days, Disp: 21 tablet, Rfl: 0 .  saccharomyces boulardii (FLORASTOR) 250 MG capsule, Take 250 mg by mouth 2 (two) times daily., Disp: , Rfl:  .  sertraline (ZOLOFT) 100 MG tablet, Take 2 tablets (200 mg total) by mouth daily., Disp: 60 tablet, Rfl: 11 .  vitamin C (ASCORBIC ACID) 500 MG tablet, Take 500 mg by mouth daily., Disp: , Rfl:   Follow up plan: Return in about 3 months (around 10/06/2016) for recheck.  Educational handout given for neuropathic pain  Terald Sleeper PA-C [facility] [address]  [place], [place] [address] [phone]   07/11/2016, 8:49 PM

## 2016-07-13 ENCOUNTER — Other Ambulatory Visit: Payer: Self-pay | Admitting: Physician Assistant

## 2016-07-15 NOTE — Telephone Encounter (Signed)
Refill called to [facility] 

## 2016-07-17 ENCOUNTER — Telehealth: Payer: Self-pay | Admitting: Physician Assistant

## 2016-09-20 DIAGNOSIS — M4317 Spondylolisthesis, lumbosacral region: Secondary | ICD-10-CM | POA: Diagnosis not present

## 2016-09-20 DIAGNOSIS — M4716 Other spondylosis with myelopathy, lumbar region: Secondary | ICD-10-CM | POA: Diagnosis not present

## 2016-09-23 ENCOUNTER — Other Ambulatory Visit: Payer: Self-pay | Admitting: Nurse Practitioner

## 2016-09-23 DIAGNOSIS — M4716 Other spondylosis with myelopathy, lumbar region: Secondary | ICD-10-CM

## 2016-09-23 DIAGNOSIS — M4317 Spondylolisthesis, lumbosacral region: Secondary | ICD-10-CM

## 2016-09-26 ENCOUNTER — Ambulatory Visit
Admission: RE | Admit: 2016-09-26 | Discharge: 2016-09-26 | Disposition: A | Discharge status: Home / Self Care | Payer: Medicare Other | Source: Ambulatory Visit | Attending: Nurse Practitioner | Admitting: Nurse Practitioner

## 2016-09-26 DIAGNOSIS — M4317 Spondylolisthesis, lumbosacral region: Secondary | ICD-10-CM

## 2016-09-26 DIAGNOSIS — M48061 Spinal stenosis, lumbar region without neurogenic claudication: Secondary | ICD-10-CM | POA: Diagnosis not present

## 2016-09-26 DIAGNOSIS — M4716 Other spondylosis with myelopathy, lumbar region: Secondary | ICD-10-CM

## 2016-10-02 ENCOUNTER — Other Ambulatory Visit: Payer: Self-pay | Admitting: Physician Assistant

## 2016-10-02 DIAGNOSIS — I10 Essential (primary) hypertension: Secondary | ICD-10-CM

## 2016-10-04 DIAGNOSIS — M4716 Other spondylosis with myelopathy, lumbar region: Secondary | ICD-10-CM | POA: Diagnosis not present

## 2016-10-04 DIAGNOSIS — M4317 Spondylolisthesis, lumbosacral region: Secondary | ICD-10-CM | POA: Diagnosis not present

## 2016-11-29 DIAGNOSIS — M4317 Spondylolisthesis, lumbosacral region: Secondary | ICD-10-CM | POA: Diagnosis not present

## 2016-11-29 DIAGNOSIS — M4716 Other spondylosis with myelopathy, lumbar region: Secondary | ICD-10-CM | POA: Diagnosis not present

## 2016-12-23 ENCOUNTER — Telehealth: Payer: Self-pay | Admitting: Physician Assistant

## 2016-12-23 NOTE — Telephone Encounter (Signed)
PT is scheduled for Mammogram on [place] here at the office on 01/03/17 at 1 PM

## 2017-01-08 ENCOUNTER — Other Ambulatory Visit: Payer: Self-pay | Admitting: Physician Assistant

## 2017-01-08 NOTE — Telephone Encounter (Signed)
lmovm asking pt is she requested RF Asking if she was having sxs for her request

## 2017-01-09 NOTE — Telephone Encounter (Signed)
Pt states she has a rash with bumps on her chest under breast and she says she has taken prednisone for it in the past. She would like rx sent in because she doesn't have transportation to get here to be seen.

## 2017-01-09 NOTE — Telephone Encounter (Signed)
Last seen 07/09/16  [facility]

## 2017-01-14 DIAGNOSIS — M4716 Other spondylosis with myelopathy, lumbar region: Secondary | ICD-10-CM | POA: Diagnosis not present

## 2017-01-14 DIAGNOSIS — M25869 Other specified joint disorders, unspecified knee: Secondary | ICD-10-CM | POA: Diagnosis not present

## 2017-01-16 ENCOUNTER — Other Ambulatory Visit: Payer: Self-pay | Admitting: Physician Assistant

## 2017-01-16 DIAGNOSIS — F332 Major depressive disorder, recurrent severe without psychotic features: Secondary | ICD-10-CM

## 2017-01-16 DIAGNOSIS — I10 Essential (primary) hypertension: Secondary | ICD-10-CM

## 2017-01-17 NOTE — Telephone Encounter (Signed)
rx called into pharmacy

## 2017-01-20 ENCOUNTER — Ambulatory Visit: Payer: Medicare Other | Admitting: *Deleted

## 2017-02-10 ENCOUNTER — Ambulatory Visit: Payer: Medicare Other | Admitting: Physician Assistant

## 2017-02-10 ENCOUNTER — Other Ambulatory Visit: Payer: Self-pay | Admitting: Physician Assistant

## 2017-02-11 NOTE — Telephone Encounter (Signed)
rx called to pharmacy 

## 2017-02-14 ENCOUNTER — Ambulatory Visit: Payer: Medicare Other | Admitting: Physician Assistant

## 2017-02-17 ENCOUNTER — Encounter: Payer: Self-pay | Admitting: Physician Assistant

## 2017-02-17 ENCOUNTER — Ambulatory Visit (INDEPENDENT_AMBULATORY_CARE_PROVIDER_SITE_OTHER): Payer: Medicare Other | Admitting: Physician Assistant

## 2017-02-17 VITALS — BP 122/65 | HR 54 | Temp 98.2°F | Ht 64.0 in | Wt 229.8 lb

## 2017-02-17 DIAGNOSIS — I10 Essential (primary) hypertension: Secondary | ICD-10-CM | POA: Diagnosis not present

## 2017-02-17 DIAGNOSIS — F419 Anxiety disorder, unspecified: Secondary | ICD-10-CM | POA: Diagnosis not present

## 2017-02-17 DIAGNOSIS — F332 Major depressive disorder, recurrent severe without psychotic features: Secondary | ICD-10-CM

## 2017-02-17 DIAGNOSIS — E7800 Pure hypercholesterolemia, unspecified: Secondary | ICD-10-CM

## 2017-02-17 DIAGNOSIS — J0110 Acute frontal sinusitis, unspecified: Secondary | ICD-10-CM

## 2017-02-17 MED ORDER — MECLIZINE HCL 25 MG PO TABS: 25.0000 mg | ORAL_TABLET | Freq: Three times a day (TID) | ORAL | 2 refills | Status: DC | PRN

## 2017-02-17 MED ORDER — AZITHROMYCIN 250 MG PO TABS: ORAL_TABLET | ORAL | 0 refills | Status: DC

## 2017-02-17 MED ORDER — CLOBETASOL PROPIONATE 0.05 % EX CREA: 1.0000 "application " | TOPICAL_CREAM | Freq: Two times a day (BID) | CUTANEOUS | 6 refills | Status: DC

## 2017-02-17 MED ORDER — ALPRAZOLAM 0.5 MG PO TABS: ORAL_TABLET | ORAL | 2 refills | Status: DC

## 2017-02-17 MED ORDER — FLUOXETINE HCL 20 MG PO TABS: 20.0000 mg | ORAL_TABLET | Freq: Every day | ORAL | 3 refills | Status: DC

## 2017-02-17 MED ORDER — PREDNISONE 10 MG PO TABS: ORAL_TABLET | ORAL | 0 refills | Status: DC

## 2017-02-17 NOTE — Patient Instructions (Signed)
In a few days you may receive a survey in the mail or online from [employer] regarding your visit with us today. Please take a moment to fill this out. Your feedback is very important to our whole office. It can help us better understand your needs as well as improve your experience and satisfaction. Thank you for taking your time to complete it. We care about you.  Analyah Mcconnon, PA-C  

## 2017-02-18 LAB — CMP14+EGFR
A/G RATIO: 1.6 (ref 1.2–2.2)
ALT: 10 IU/L (ref 0–32)
AST: 15 IU/L (ref 0–40)
Albumin: 4.2 g/dL (ref 3.5–4.8)
Alkaline Phosphatase: 122 IU/L — ABNORMAL HIGH (ref 39–117)
BUN/Creatinine Ratio: 15 (ref 12–28)
BUN: 13 mg/dL (ref 8–27)
Bilirubin Total: 0.3 mg/dL (ref 0.0–1.2)
CALCIUM: 9.4 mg/dL (ref 8.7–10.3)
CO2: 24 mmol/L (ref 20–29)
CREATININE: 0.88 mg/dL (ref 0.57–1.00)
Chloride: 101 mmol/L (ref 96–106)
GFR, EST AFRICAN AMERICAN: 77 mL/min/{1.73_m2} (ref >59)
GFR, EST NON AFRICAN AMERICAN: 67 mL/min/{1.73_m2} (ref >59)
Globulin, Total: 2.7 g/dL (ref 1.5–4.5)
Glucose: 93 mg/dL (ref 65–99)
POTASSIUM: 3.6 mmol/L (ref 3.5–5.2)
Sodium: 141 mmol/L (ref 134–144)
TOTAL PROTEIN: 6.9 g/dL (ref 6.0–8.5)

## 2017-02-18 LAB — CBC WITH DIFFERENTIAL/PLATELET
BASOS: 1 %
Basophils Absolute: 0 10*3/uL (ref 0.0–0.2)
EOS (ABSOLUTE): 0.3 10*3/uL (ref 0.0–0.4)
EOS: 4 %
HEMATOCRIT: 37.1 % (ref 34.0–46.6)
Hemoglobin: 12.4 g/dL (ref 11.1–15.9)
IMMATURE GRANS (ABS): 0 10*3/uL (ref 0.0–0.1)
IMMATURE GRANULOCYTES: 0 %
LYMPHS: 28 %
Lymphocytes Absolute: 2.2 10*3/uL (ref 0.7–3.1)
MCH: 29.4 pg (ref 26.6–33.0)
MCHC: 33.4 g/dL (ref 31.5–35.7)
MCV: 88 fL (ref 79–97)
Monocytes Absolute: 0.5 10*3/uL (ref 0.1–0.9)
Monocytes: 7 %
NEUTROS PCT: 60 %
Neutrophils Absolute: 4.6 10*3/uL (ref 1.4–7.0)
PLATELETS: 383 10*3/uL — AB (ref 150–379)
RBC: 4.22 x10E6/uL (ref 3.77–5.28)
RDW: 14.2 % (ref 12.3–15.4)
WBC: 7.6 10*3/uL (ref 3.4–10.8)

## 2017-02-18 LAB — LIPID PANEL
CHOL/HDL RATIO: 3 ratio (ref 0.0–4.4)
Cholesterol, Total: 166 mg/dL (ref 100–199)
HDL: 55 mg/dL (ref >39)
LDL CALC: 87 mg/dL (ref 0–99)
Triglycerides: 122 mg/dL (ref 0–149)
VLDL CHOLESTEROL CAL: 24 mg/dL (ref 5–40)

## 2017-02-18 NOTE — Progress Notes (Signed)
BP 122/65   Pulse (!) 54   Temp 98.2 F (36.8 C) (Oral)   Ht '5\' 4"'$  (1.626 m)   Wt 229 lb 12.8 oz (104.2 kg)   BMI 39.45 kg/m    Subjective:    Patient ID: [name], female    DOB: [birth year], 71 y.o.   MRN: [id number]  HPI: [name] is a 71 y.o. female presenting on 02/17/2017 for Follow-up (6 month ) and Medication Refill  This patient comes in for periodic recheck on medications and conditions including Hypertension, depression, anxiety the situs, hyperlipidemia. The patient has severe amount of stress. She has a homebound daughter that has severe OCD. She has for many years. She is not able to get her out of the home at all. And she has a very extreme behaviors when it comes to meals and germs. It is making Ms. Tango is very stressed. She is also having a flareup of her sinusitis. She has this several times a year. She denies any fever or chills at this time. She stopped taking pravastatin because her lipids were so good. She also stopped taking the gabapentin because the nerve pain from her pinched nerve in her lumbar spine is greatly improved. She is still seen her surgeon and orthopedist. She is also having a flareup of her dizziness. She has used meclizine as needed in the past. And she needs a refill..   All medications are reviewed today. There are no reports of any problems with the medications. All of the medical conditions are reviewed and updated.  Lab work is reviewed and will be ordered as medically necessary. There are no new problems reported with today's visit.   Relevant past medical, surgical, family and social history reviewed and updated as indicated. Allergies and medications reviewed and updated.  Past Medical History:  Diagnosis Date  . Allergy   . Bronchitis, chronic ([place])   . Chronic anxiety   . Complication of anesthesia    hard to wake up  . COPD (chronic obstructive pulmonary disease) ([place])   . Depression   . Esophageal reflux   . Headache(784.0)    . Hyperlipidemia   . Hypertension   . Hypothyroidism   . Obesity   . Osteoporosis   . Overactive bladder   . PVC's (premature ventricular contractions)   . Thyroid disease   . Vitamin D deficiency disease     Past Surgical History:  Procedure Laterality Date  . ABDOMINAL HYSTERECTOMY    . CHOLECYSTECTOMY N/A 11/04/2013   Procedure: LAPAROSCOPIC CHOLECYSTECTOMY WITH INTRAOPERATIVE CHOLANGIOGRAM;  Surgeon: Imogene Burn. Georgette Dover, MD;  Location: [facility];  Service: General;  Laterality: N/A;  . COLONOSCOPY    . FRACTURE SURGERY     pins removed from Hip surgery  . HIP FRACTURE SURGERY  1990    pins removed in 1991  . PARTIAL HYSTERECTOMY  1987  . SPINAL FUSION    . UPPER GASTROINTESTINAL ENDOSCOPY      Review of Systems  Constitutional: Positive for fatigue. Negative for activity change, appetite change, chills and fever.  HENT: Positive for congestion, postnasal drip, sinus pain, sinus pressure and sore throat.   Eyes: Negative.   Respiratory: Positive for cough. Negative for wheezing.   Cardiovascular: Negative.  Negative for chest pain, palpitations and leg swelling.  Gastrointestinal: Negative.   Genitourinary: Negative.   Musculoskeletal: Positive for arthralgias, back pain and gait problem.  Skin: Negative.   Neurological: Positive for headaches.  Psychiatric/Behavioral: Positive for decreased concentration  and dysphoric mood. Negative for suicidal ideas. The patient is nervous/anxious.     Allergies as of 02/17/2017      Reactions   Celebrex [celecoxib] Swelling   Keflet [cephalexin] Swelling   Kenalog [triamcinolone Acetonide]    unknown   Lisinopril Other (See Comments)   unknown   Penicillins Hives, Swelling   Sulfa Antibiotics Swelling      Medication List       Accurate as of 02/17/17 11:59 PM. Always use your most recent med list.          acetaminophen 650 MG CR tablet Commonly known as:  TYLENOL Take 650 mg by mouth every 8 (eight) hours as needed for  pain.   ALPRAZolam 0.5 MG tablet Commonly known as:  XANAX TAKE ONE (1) TABLET THREE (3) TIMES EACH DAY   amLODipine 5 MG tablet Commonly known as:  NORVASC TAKE ONE (1) TABLET EACH DAY   aspirin 81 MG EC tablet Take 1 tablet (81 mg total) by mouth daily. Swallow whole.   azithromycin 250 MG tablet Commonly known as:  ZITHROMAX Z-PAK As directed   calcium carbonate 1250 (500 Ca) MG tablet Commonly known as:  OS-CAL - dosed in mg of elemental calcium Take 1 tablet by mouth.   cetirizine 10 MG tablet Commonly known as:  ZYRTEC Take 1 tablet (10 mg total) by mouth daily. Take for rash   cholecalciferol 1000 units tablet Commonly known as:  VITAMIN D Take 1,000 Units by mouth daily.   clobetasol cream 0.05 % Commonly known as:  TEMOVATE Apply 1 application topically 2 (two) times daily.   FLUoxetine 20 MG tablet Commonly known as:  PROZAC Take 1-2 tablets (20-40 mg total) by mouth daily.   fluticasone 50 MCG/ACT nasal spray Commonly known as:  FLONASE Place 2 sprays into both nostrils daily.   hydrochlorothiazide 25 MG tablet Commonly known as:  HYDRODIURIL TAKE ONE (1) TABLET EACH DAY   levothyroxine 50 MCG tablet Commonly known as:  SYNTHROID, LEVOTHROID TAKE ONE (1) TABLET EACH DAY   losartan 100 MG tablet Commonly known as:  COZAAR TAKE ONE (1) TABLET EACH DAY   meclizine 25 MG tablet Commonly known as:  ANTIVERT Take 1 tablet (25 mg total) by mouth 3 (three) times daily as needed for dizziness.   multivitamin tablet Take 1 tablet by mouth daily.   omeprazole-sodium bicarbonate 40-1100 MG capsule Commonly known as:  ZEGERID Take 1 capsule by mouth daily before breakfast.   oxyCODONE-acetaminophen 5-325 MG tablet Commonly known as:  PERCOCET/ROXICET Take 1 tablet by mouth every 6 (six) hours as needed.   predniSONE 10 MG tablet Commonly known as:  DELTASONE TAKE 6 TABLETS TODAY THEN DECREASE BY 1 TABLET DAILY (6-5-4-3-2-1)   saccharomyces  boulardii 250 MG capsule Commonly known as:  FLORASTOR Take 250 mg by mouth 2 (two) times daily.   vitamin C 500 MG tablet Commonly known as:  ASCORBIC ACID Take 500 mg by mouth daily.            Discharge Care Instructions        Start     Ordered   02/17/17 0000  azithromycin (ZITHROMAX Z-PAK) 250 MG tablet    Question:  Supervising Provider  Answer:  Timmothy Euler   02/17/17 1451   02/17/17 0000  meclizine (ANTIVERT) 25 MG tablet  3 times daily PRN    Question:  Supervising Provider  Answer:  Timmothy Euler   02/17/17 1451   02/17/17  0000  predniSONE (DELTASONE) 10 MG tablet    Question:  Supervising Provider  Answer:  Timmothy Euler   02/17/17 1451   02/17/17 0000  FLUoxetine (PROZAC) 20 MG tablet  Daily    Question:  Supervising Provider  Answer:  Timmothy Euler   02/17/17 1454   02/17/17 0000  clobetasol cream (TEMOVATE) 0.05 %  2 times daily    Question:  Supervising Provider  Answer:  Timmothy Euler   02/17/17 1457   02/17/17 0000  ALPRAZolam (XANAX) 0.5 MG tablet    Question:  Supervising Provider  Answer:  Kenn File L   02/17/17 1457   02/17/17 0000  CBC with Differential/Platelet     02/17/17 1458   02/17/17 0000  CMP14+EGFR     02/17/17 1458   02/17/17 0000  Lipid panel     02/17/17 1458         Objective:    BP 122/65   Pulse (!) 54   Temp 98.2 F (36.8 C) (Oral)   Ht '5\' 4"'$  (1.626 m)   Wt 229 lb 12.8 oz (104.2 kg)   BMI 39.45 kg/m   Allergies  Allergen Reactions  . Celebrex [Celecoxib] Swelling  . Keflet [Cephalexin] Swelling  . Kenalog [Triamcinolone Acetonide]     unknown  . Lisinopril Other (See Comments)    unknown  . Penicillins Hives and Swelling  . Sulfa Antibiotics Swelling    Physical Exam  Constitutional: She is oriented to person, place, and time. She appears well-developed and well-nourished.  HENT:  Head: Normocephalic and atraumatic.  Right Ear: Tympanic membrane and external ear normal. No  middle ear effusion.  Left Ear: Tympanic membrane and external ear normal.  No middle ear effusion.  Nose: Mucosal edema and rhinorrhea present. Right sinus exhibits no maxillary sinus tenderness. Left sinus exhibits no maxillary sinus tenderness.  Mouth/Throat: Uvula is midline. Posterior oropharyngeal erythema present.  Eyes: Pupils are equal, round, and reactive to light. Conjunctivae and EOM are normal. Right eye exhibits no discharge. Left eye exhibits no discharge.  Neck: Normal range of motion.  Cardiovascular: Normal rate, regular rhythm and normal heart sounds.   Pulmonary/Chest: Effort normal and breath sounds normal. No respiratory distress. She has no wheezes.  Abdominal: Soft.  Musculoskeletal:       Lumbar back: She exhibits decreased range of motion and pain. She exhibits no deformity.  Walks carefully and guarded with walker  Lymphadenopathy:    She has no cervical adenopathy.  Neurological: She is alert and oriented to person, place, and time.  Skin: Skin is warm and dry.  Psychiatric: She has a normal mood and affect.    Results for orders placed or performed in visit on 02/17/17  CBC with Differential/Platelet  Result Value Ref Range   WBC 7.6 3.4 - 10.8 x10E3/uL   RBC 4.22 3.77 - 5.28 x10E6/uL   Hemoglobin 12.4 11.1 - 15.9 g/dL   Hematocrit 37.1 34.0 - 46.6 %   MCV 88 79 - 97 fL   MCH 29.4 26.6 - 33.0 pg   MCHC 33.4 31.5 - 35.7 g/dL   RDW 14.2 12.3 - 15.4 %   Platelets 383 (H) 150 - 379 x10E3/uL   Neutrophils 60 Not Estab. %   Lymphs 28 Not Estab. %   Monocytes 7 Not Estab. %   Eos 4 Not Estab. %   Basos 1 Not Estab. %   Neutrophils Absolute 4.6 1.4 - 7.0 x10E3/uL   Lymphocytes Absolute  2.2 0.7 - 3.1 x10E3/uL   Monocytes Absolute 0.5 0.1 - 0.9 x10E3/uL   EOS (ABSOLUTE) 0.3 0.0 - 0.4 x10E3/uL   Basophils Absolute 0.0 0.0 - 0.2 x10E3/uL   Immature Granulocytes 0 Not Estab. %   Immature Grans (Abs) 0.0 0.0 - 0.1 x10E3/uL  CMP14+EGFR  Result Value Ref Range    Glucose 93 65 - 99 mg/dL   BUN 13 8 - 27 mg/dL   Creatinine, Ser 0.88 0.57 - 1.00 mg/dL   GFR calc non Af Amer 67 >59 mL/min/1.73   GFR calc Af Amer 77 >59 mL/min/1.73   BUN/Creatinine Ratio 15 12 - 28   Sodium 141 134 - 144 mmol/L   Potassium 3.6 3.5 - 5.2 mmol/L   Chloride 101 96 - 106 mmol/L   CO2 24 20 - 29 mmol/L   Calcium 9.4 8.7 - 10.3 mg/dL   Total Protein 6.9 6.0 - 8.5 g/dL   Albumin 4.2 3.5 - 4.8 g/dL   Globulin, Total 2.7 1.5 - 4.5 g/dL   Albumin/Globulin Ratio 1.6 1.2 - 2.2   Bilirubin Total 0.3 0.0 - 1.2 mg/dL   Alkaline Phosphatase 122 (H) 39 - 117 IU/L   AST 15 0 - 40 IU/L   ALT 10 0 - 32 IU/L  Lipid panel  Result Value Ref Range   Cholesterol, Total 166 100 - 199 mg/dL   Triglycerides 122 0 - 149 mg/dL   HDL 55 >39 mg/dL   VLDL Cholesterol Cal 24 5 - 40 mg/dL   LDL Calculated 87 0 - 99 mg/dL   Chol/HDL Ratio 3.0 0.0 - 4.4 ratio      Assessment & Plan:   1. Essential hypertension - CBC with Differential/Platelet - CMP14+EGFR - Lipid panel  2. Severe episode of recurrent major depressive disorder, without psychotic features ([place]) - predniSONE (DELTASONE) 10 MG tablet; TAKE 6 TABLETS TODAY THEN DECREASE BY 1 TABLET DAILY (6-5-4-3-2-1)  Dispense: 21 tablet; Refill: 0 - FLUoxetine (PROZAC) 20 MG tablet; Take 1-2 tablets (20-40 mg total) by mouth daily.  Dispense: 60 tablet; Refill: 3  3. Anxiety - ALPRAZolam (XANAX) 0.5 MG tablet; TAKE ONE (1) TABLET THREE (3) TIMES EACH DAY  Dispense: 90 tablet; Refill: 2  4. Acute non-recurrent frontal sinusitis - azithromycin (ZITHROMAX Z-PAK) 250 MG tablet; As directed  Dispense: 6 tablet; Refill: 0  5. Pure hypercholesterolemia - CMP14+EGFR - Lipid panel    Current Outpatient Prescriptions:  .  acetaminophen (TYLENOL) 650 MG CR tablet, Take 650 mg by mouth every 8 (eight) hours as needed for pain., Disp: , Rfl:  .  ALPRAZolam (XANAX) 0.5 MG tablet, TAKE ONE (1) TABLET THREE (3) TIMES EACH DAY, Disp: 90 tablet,  Rfl: 2 .  amLODipine (NORVASC) 5 MG tablet, TAKE ONE (1) TABLET EACH DAY, Disp: 90 tablet, Rfl: 0 .  aspirin 81 MG EC tablet, Take 1 tablet (81 mg total) by mouth daily. Swallow whole., Disp: 30 tablet, Rfl: 12 .  calcium carbonate (OS-CAL - DOSED IN MG OF ELEMENTAL CALCIUM) 1250 (500 Ca) MG tablet, Take 1 tablet by mouth., Disp: , Rfl:  .  cetirizine (ZYRTEC) 10 MG tablet, Take 1 tablet (10 mg total) by mouth daily. Take for rash, Disp: 30 tablet, Rfl: 11 .  cholecalciferol (VITAMIN D) 1000 units tablet, Take 1,000 Units by mouth daily., Disp: , Rfl:  .  clobetasol cream (TEMOVATE) 8.14 %, Apply 1 application topically 2 (two) times daily., Disp: 60 g, Rfl: 6 .  fluticasone (FLONASE) 50 MCG/ACT  nasal spray, Place 2 sprays into both nostrils daily., Disp: 16 g, Rfl: 11 .  hydrochlorothiazide (HYDRODIURIL) 25 MG tablet, TAKE ONE (1) TABLET EACH DAY, Disp: 90 tablet, Rfl: 0 .  levothyroxine (SYNTHROID, LEVOTHROID) 50 MCG tablet, TAKE ONE (1) TABLET EACH DAY, Disp: 90 tablet, Rfl: 0 .  losartan (COZAAR) 100 MG tablet, TAKE ONE (1) TABLET EACH DAY, Disp: 90 tablet, Rfl: 0 .  Multiple Vitamin (MULTIVITAMIN) tablet, Take 1 tablet by mouth daily., Disp: , Rfl:  .  omeprazole-sodium bicarbonate (ZEGERID) 40-1100 MG capsule, Take 1 capsule by mouth daily before breakfast., Disp: , Rfl:  .  oxyCODONE-acetaminophen (PERCOCET/ROXICET) 5-325 MG tablet, Take 1 tablet by mouth every 6 (six) hours as needed., Disp: 30 tablet, Rfl: 0 .  saccharomyces boulardii (FLORASTOR) 250 MG capsule, Take 250 mg by mouth 2 (two) times daily., Disp: , Rfl:  .  vitamin C (ASCORBIC ACID) 500 MG tablet, Take 500 mg by mouth daily., Disp: , Rfl:  .  azithromycin (ZITHROMAX Z-PAK) 250 MG tablet, As directed, Disp: 6 tablet, Rfl: 0 .  FLUoxetine (PROZAC) 20 MG tablet, Take 1-2 tablets (20-40 mg total) by mouth daily., Disp: 60 tablet, Rfl: 3 .  meclizine (ANTIVERT) 25 MG tablet, Take 1 tablet (25 mg total) by mouth 3 (three) times  daily as needed for dizziness., Disp: 30 tablet, Rfl: 2 .  predniSONE (DELTASONE) 10 MG tablet, TAKE 6 TABLETS TODAY THEN DECREASE BY 1 TABLET DAILY (6-5-4-3-2-1), Disp: 21 tablet, Rfl: 0 Continue all other maintenance medications as listed above.  Follow up plan: Return in about 3 months (around 05/19/2017) for recheck.  Educational handout given for [place] PA-C [facility] [address]  [place], [place] [address] [phone]   02/18/2017, 11:21 AM

## 2017-02-26 ENCOUNTER — Other Ambulatory Visit: Payer: Self-pay | Admitting: Physician Assistant

## 2017-02-26 ENCOUNTER — Telehealth: Payer: Self-pay | Admitting: Physician Assistant

## 2017-02-26 DIAGNOSIS — J0110 Acute frontal sinusitis, unspecified: Secondary | ICD-10-CM

## 2017-02-26 MED ORDER — DOXYCYCLINE HYCLATE 100 MG PO TABS: 100.0000 mg | ORAL_TABLET | Freq: Two times a day (BID) | ORAL | 0 refills | Status: DC

## 2017-02-26 NOTE — Telephone Encounter (Signed)
Patient aware.

## 2017-03-19 DIAGNOSIS — M170 Bilateral primary osteoarthritis of knee: Secondary | ICD-10-CM | POA: Insufficient documentation

## 2017-03-19 DIAGNOSIS — M25562 Pain in left knee: Secondary | ICD-10-CM | POA: Diagnosis not present

## 2017-03-19 DIAGNOSIS — M25561 Pain in right knee: Secondary | ICD-10-CM | POA: Diagnosis not present

## 2017-04-19 IMAGING — MR MR LUMBAR SPINE W/O CM
4 of 5 series · 17 of 48 positions shown · non-contrast
Comparison: None.

CLINICAL DATA: Chronic low back pain with occasional left thigh
pain and occasional bowel and bladder incontinence. No acute injury
or prior relevant surgery. Initial encounter.

EXAM:
MRI LUMBAR SPINE WITHOUT CONTRAST
TECHNIQUE: Multiplanar, multisequence MR imaging of the lumbar spine was
performed. No intravenous contrast was administered.

[Series 3: T1 · sagittal · 4.0mm · 0.84mm/px · 3 of 14 slices shown (1 of 2)]
[im 3/14]
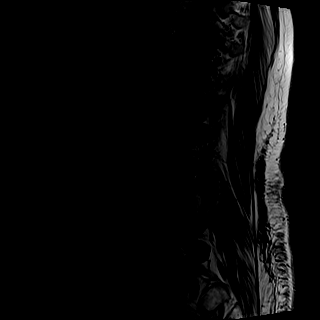
[im 8/14]
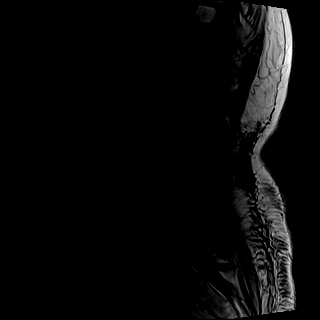
[im 14/14]
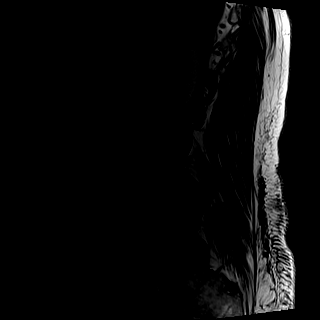

[Series 4: T2 · sagittal · 4.0mm · 0.42mm/px · 7 of 14 slices shown (1 of 2)]
[im 1/14]
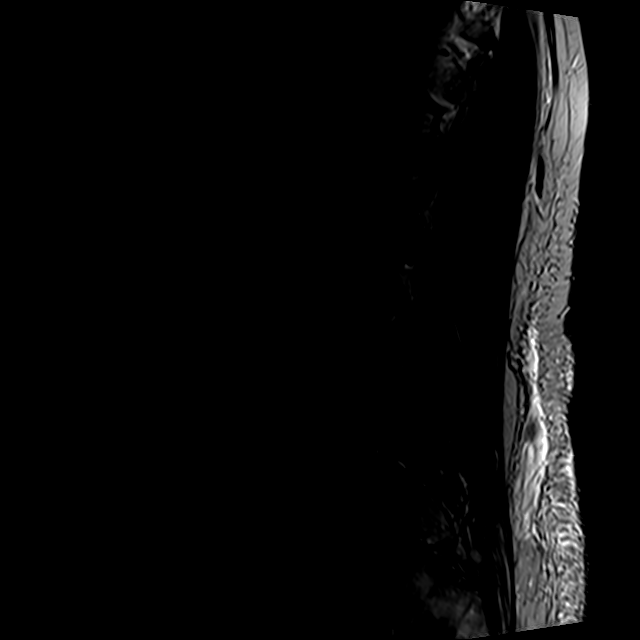
[im 3/14]
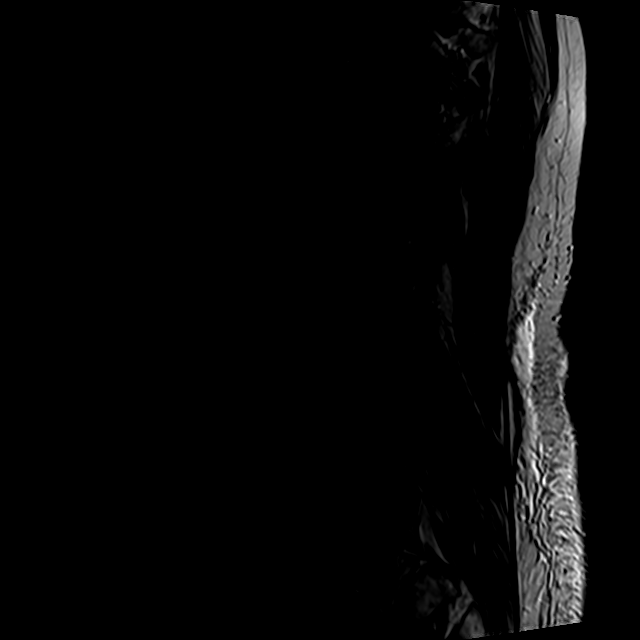
[im 5/14]
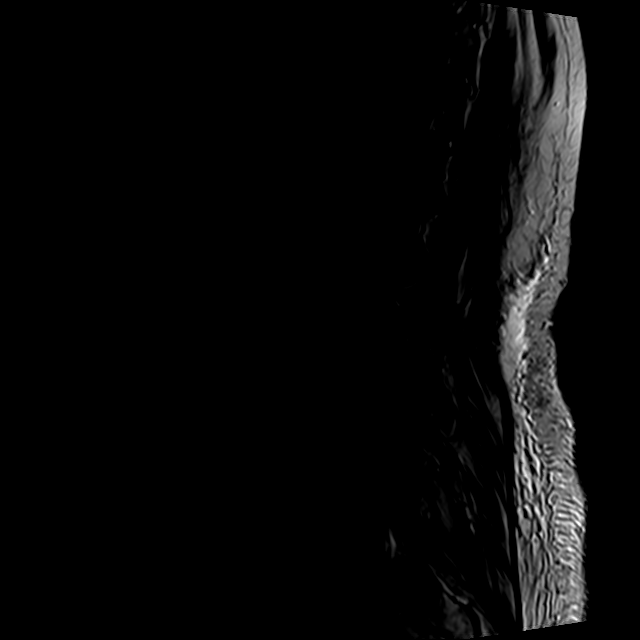
[im 7/14]
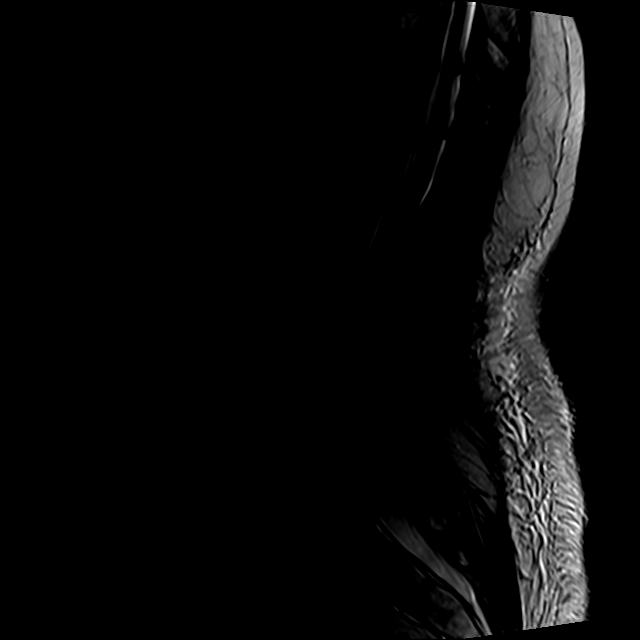
[im 9/14]
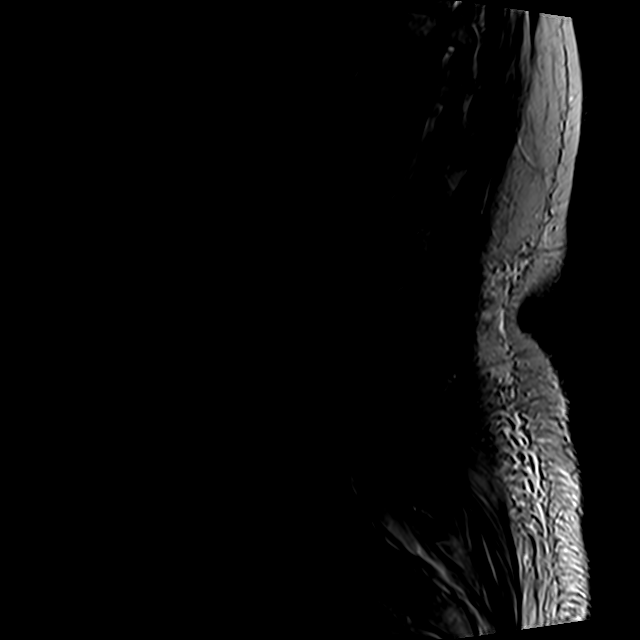
[im 11/14]
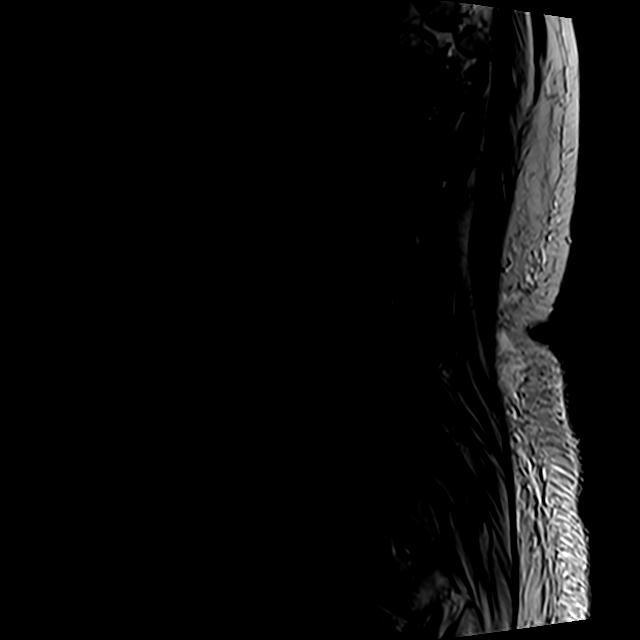
[im 14/14]
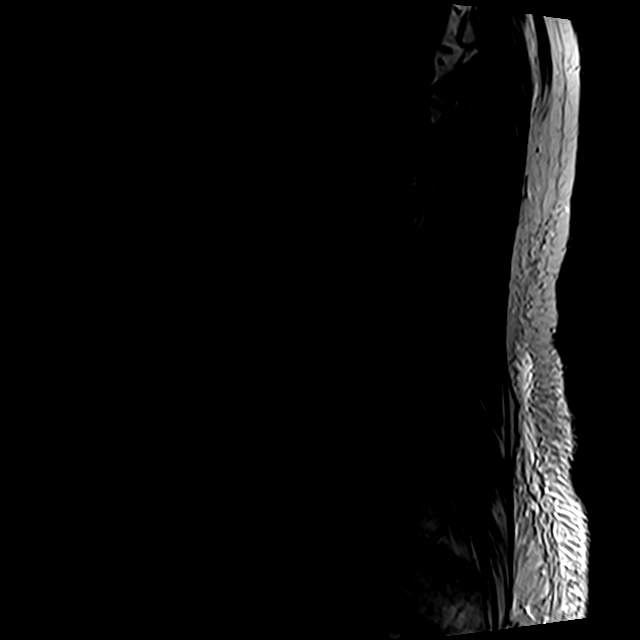

[Series 5: T2 · axial · 4.0mm · 0.37mm/px · z∈[-39,+87]mm · 4 of 30 slices shown (2 of 2)]
[im 1/30]
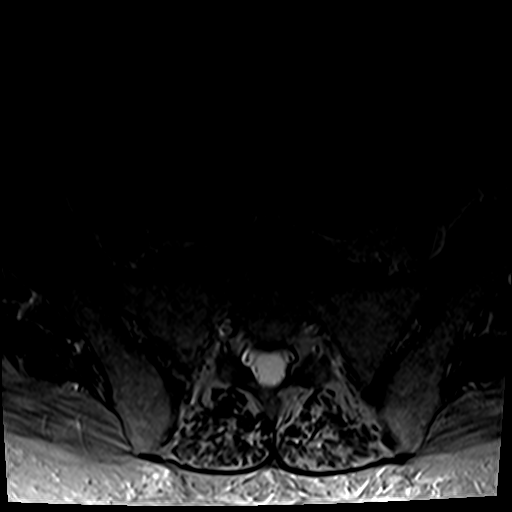
[im 5/30]
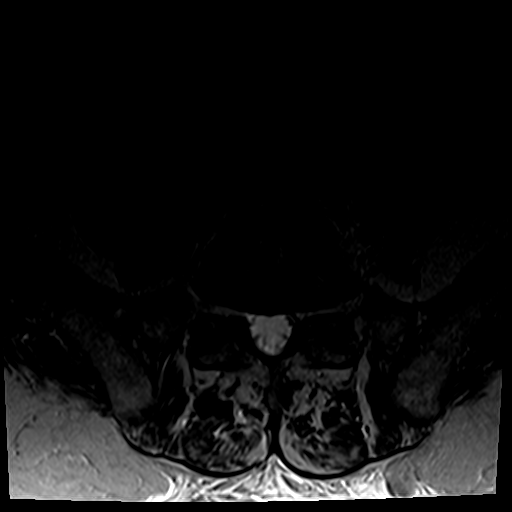
[im 16/30]
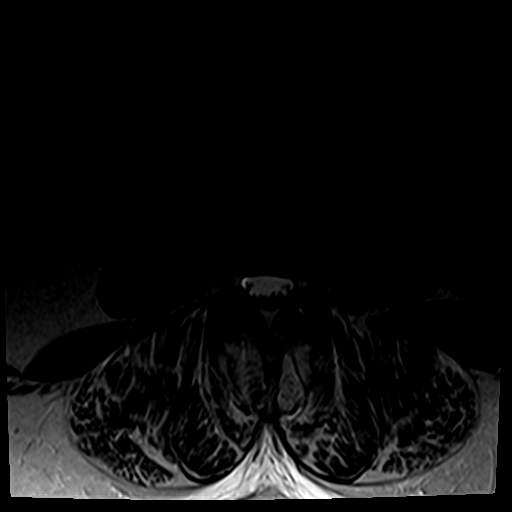
[im 25/30]
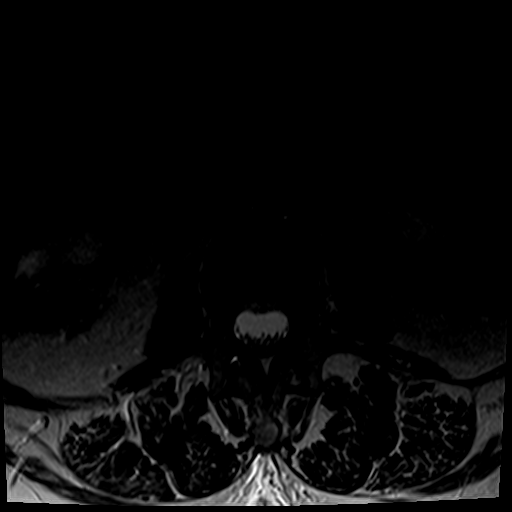

[Series 7: T1 · axial · 4.0mm · 0.37mm/px · z∈[-19,+87]mm · 3 of 30 slices shown (2 of 2)]
[im 5/30]
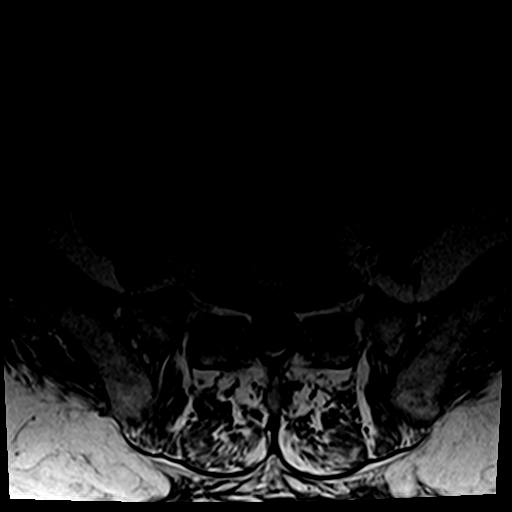
[im 16/30]
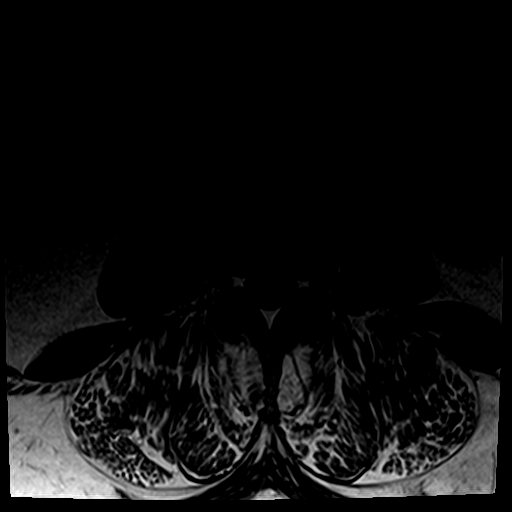
[im 25/30]
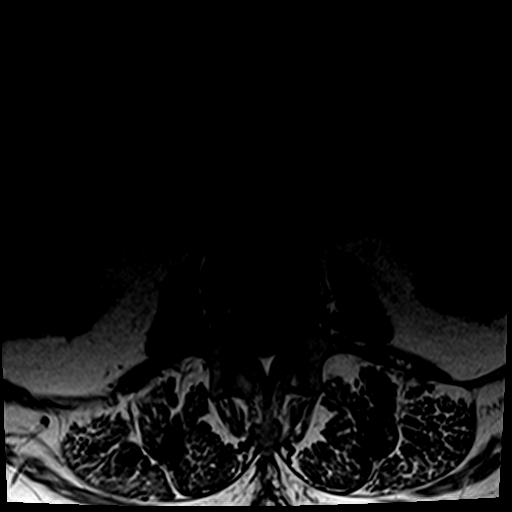

[17 of 48 positions shown; findings below may reference images not displayed]

FINDINGS: Segmentation: Conventional anatomy assumed, with the last open disc
space designated L5-S1.

Alignment: There is 10 mm of anterolisthesis at L4-5 secondary to
facet disease. The alignment is otherwise normal.

Bones: No worrisome osseous lesion, acute fracture or pars defect.
There is a large T10 vertebral body hemangioma. There are other
smaller hemangiomas as well.

Conus medullaris: Extends to the L1-2 level and appears normal.

Paraspinal and other soft tissues: No significant paraspinal
findings.

Disc levels:

Sagittal images demonstrate facet hypertrophy at T10-11,
contributing to mild mass effect on the thecal sac, but no cord
deformity. There is mild disc bulging at T11-12 and T12-L1.

L1-2:  No significant findings.

L2-3:  No significant findings.

L3-4: Disc height and hydration are maintained. Mild bilateral facet
hypertrophy. No spinal stenosis or definite nerve root encroachment.
A suggested extraforaminal disc protrusion on the left on axial
image 16 is probably due to volume averaging with the left L4
pedicle. No corresponding finding demonstrated on the sagittal
images.

L4-5: There is advanced bilateral facet hypertrophy accounting for
the grade 1 anterolisthesis. There is annular disc bulging with loss
of disc height. These factors contribute to moderate to severe
spinal stenosis with narrowing of both lateral recesses. There is
also asymmetric narrowing of the right foramen and possible right L4
nerve root encroachment. The left foramen appears adequately patent.

L5-S1: Chronic degenerative disc disease with loss of disc height,
endplate degeneration and osteophytes. Mild bilateral facet
hypertrophy. Mild inferior foraminal narrowing bilaterally without
nerve root encroachment.
IMPRESSION: 1. Moderate to severe multifactorial spinal stenosis at L4-5. There
is advanced facet disease with a resulting grade 1 anterolisthesis
and diffuse disc degeneration. Asymmetric right foraminal narrowing
is present.
2. No other significant spinal stenosis or nerve root encroachment.
3. Chronic degenerative disc disease at L5-S1 with mild foraminal
narrowing bilaterally.

## 2017-04-30 ENCOUNTER — Other Ambulatory Visit: Payer: Self-pay | Admitting: Physician Assistant

## 2017-04-30 DIAGNOSIS — F332 Major depressive disorder, recurrent severe without psychotic features: Secondary | ICD-10-CM

## 2017-04-30 DIAGNOSIS — I10 Essential (primary) hypertension: Secondary | ICD-10-CM

## 2017-05-01 ENCOUNTER — Other Ambulatory Visit: Payer: Self-pay

## 2017-05-29 ENCOUNTER — Other Ambulatory Visit: Payer: Self-pay | Admitting: Physician Assistant

## 2017-05-29 DIAGNOSIS — F332 Major depressive disorder, recurrent severe without psychotic features: Secondary | ICD-10-CM

## 2017-06-03 DIAGNOSIS — M546 Pain in thoracic spine: Secondary | ICD-10-CM | POA: Diagnosis not present

## 2017-06-03 DIAGNOSIS — M4716 Other spondylosis with myelopathy, lumbar region: Secondary | ICD-10-CM | POA: Diagnosis not present

## 2017-06-03 DIAGNOSIS — M170 Bilateral primary osteoarthritis of knee: Secondary | ICD-10-CM | POA: Diagnosis not present

## 2017-06-03 DIAGNOSIS — M25561 Pain in right knee: Secondary | ICD-10-CM | POA: Diagnosis not present

## 2017-06-03 DIAGNOSIS — M25562 Pain in left knee: Secondary | ICD-10-CM | POA: Diagnosis not present

## 2017-06-09 ENCOUNTER — Other Ambulatory Visit: Payer: Self-pay | Admitting: Nurse Practitioner

## 2017-06-09 DIAGNOSIS — M546 Pain in thoracic spine: Secondary | ICD-10-CM

## 2017-06-18 ENCOUNTER — Inpatient Hospital Stay
Admission: RE | Admit: 2017-06-18 | Discharge: 2017-06-18 | Disposition: A | Discharge status: Home / Self Care | Payer: Medicare Other | Source: Ambulatory Visit | Attending: Nurse Practitioner | Admitting: Nurse Practitioner

## 2017-06-26 ENCOUNTER — Telehealth: Payer: Self-pay | Admitting: Physician Assistant

## 2017-06-26 NOTE — Telephone Encounter (Signed)
We need to ask more questions; fever,ear pain, cough, congestion??? When did it start?  NA / no VM - 1/31-jhb

## 2017-06-27 ENCOUNTER — Other Ambulatory Visit: Payer: Self-pay | Admitting: Physician Assistant

## 2017-06-27 DIAGNOSIS — F332 Major depressive disorder, recurrent severe without psychotic features: Secondary | ICD-10-CM

## 2017-06-27 DIAGNOSIS — F419 Anxiety disorder, unspecified: Secondary | ICD-10-CM

## 2017-07-28 ENCOUNTER — Telehealth: Payer: Self-pay | Admitting: Physician Assistant

## 2017-07-29 NOTE — Progress Notes (Signed)
Subjective: CC: LE edema PCP: Terald Sleeper, PA-C MPN:[name] is a 72 y.o. female presenting to clinic today for:  1. LE edema Patient reports onset of bilateral lower extremity edema many years ago.  She notes that over the last 3 days lower extremity edema seems to be worse.  She reports associated irritation and redness of her skin.  She notes that she has tried elevating her lower extremities in the past but that her back pain prevents her from doing so in a seated position.  When she lays flat in bed, she is able to elevate them slightly with a wedge.  She is also been taking 3 of her hydrochlorothiazide pills daily for the last couple of days.  She reports frequent urine output.  Denies any increased warmth, bruising or skin breakdown.  No fevers, chills.  No chest pain, no shortness of breath, no orthopnea.  Has not been using any NSAIDs recently.  Reports that she does eat moderate amounts of salt and drinks Dr. Lyndee Hensen regularly but she reports she has been trying to cut back on this.  She notes that she has no history of congestive heart failure but does report occasional skipped beats for which she used to see cardiology many years ago.  She is no longer established with a cardiologist because he retired.  She also has a history of hypothyroidism.   ROS: Per HPI  Allergies  Allergen Reactions  . Celebrex [Celecoxib] Swelling  . Keflet [Cephalexin] Swelling  . Kenalog [Triamcinolone Acetonide]     unknown  . Lisinopril Other (See Comments)    unknown  . Penicillins Hives and Swelling  . Sulfa Antibiotics Swelling   Past Medical History:  Diagnosis Date  . Allergy   . Bronchitis, chronic ([place])   . Chronic anxiety   . Complication of anesthesia    hard to wake up  . COPD (chronic obstructive pulmonary disease) ([place])   . Depression   . Esophageal reflux   . Headache(784.0)   . Hyperlipidemia   . Hypertension   . Hypothyroidism   . Obesity   . Osteoporosis   .  Overactive bladder   . PVC's (premature ventricular contractions)   . Thyroid disease   . Vitamin D deficiency disease     Current Outpatient Medications:  .  acetaminophen (TYLENOL) 650 MG CR tablet, Take 650 mg by mouth every 8 (eight) hours as needed for pain., Disp: , Rfl:  .  ALPRAZolam (XANAX) 0.5 MG tablet, TAKE ONE (1) TABLET THREE (3) TIMES EACH DAY, Disp: 90 tablet, Rfl: 1 .  amLODipine (NORVASC) 5 MG tablet, TAKE ONE (1) TABLET EACH DAY, Disp: 90 tablet, Rfl: 0 .  aspirin 81 MG EC tablet, Take 1 tablet (81 mg total) by mouth daily. Swallow whole., Disp: 30 tablet, Rfl: 12 .  azithromycin (ZITHROMAX Z-PAK) 250 MG tablet, As directed, Disp: 6 tablet, Rfl: 0 .  calcium carbonate (OS-CAL - DOSED IN MG OF ELEMENTAL CALCIUM) 1250 (500 Ca) MG tablet, Take 1 tablet by mouth., Disp: , Rfl:  .  cetirizine (ZYRTEC) 10 MG tablet, Take 1 tablet (10 mg total) by mouth daily. Take for rash, Disp: 30 tablet, Rfl: 11 .  cholecalciferol (VITAMIN D) 1000 units tablet, Take 1,000 Units by mouth daily., Disp: , Rfl:  .  clobetasol cream (TEMOVATE) 1.54 %, Apply 1 application topically 2 (two) times daily., Disp: 60 g, Rfl: 6 .  doxycycline (VIBRA-TABS) 100 MG tablet, Take 1 tablet (100 mg  total) by mouth 2 (two) times daily., Disp: 20 tablet, Rfl: 0 .  FLUoxetine (PROZAC) 20 MG capsule, TAKE 1 TO 2 CAPSULES DAILY, Disp: 60 capsule, Rfl: 0 .  fluticasone (FLONASE) 50 MCG/ACT nasal spray, Place 2 sprays into both nostrils daily., Disp: 16 g, Rfl: 11 .  hydrochlorothiazide (HYDRODIURIL) 25 MG tablet, TAKE ONE (1) TABLET EACH DAY, Disp: 90 tablet, Rfl: 0 .  levothyroxine (SYNTHROID, LEVOTHROID) 50 MCG tablet, TAKE ONE (1) TABLET EACH DAY, Disp: 90 tablet, Rfl: 0 .  losartan (COZAAR) 100 MG tablet, TAKE ONE (1) TABLET EACH DAY, Disp: 90 tablet, Rfl: 0 .  meclizine (ANTIVERT) 25 MG tablet, Take 1 tablet (25 mg total) by mouth 3 (three) times daily as needed for dizziness., Disp: 30 tablet, Rfl: 2 .  Multiple  Vitamin (MULTIVITAMIN) tablet, Take 1 tablet by mouth daily., Disp: , Rfl:  .  omeprazole-sodium bicarbonate (ZEGERID) 40-1100 MG capsule, Take 1 capsule by mouth daily before breakfast., Disp: , Rfl:  .  oxyCODONE-acetaminophen (PERCOCET/ROXICET) 5-325 MG tablet, Take 1 tablet by mouth every 6 (six) hours as needed., Disp: 30 tablet, Rfl: 0 .  predniSONE (DELTASONE) 10 MG tablet, TAKE 6 TABLETS TODAY THEN DECREASE BY 1 TABLET DAILY (6-5-4-3-2-1), Disp: 21 tablet, Rfl: 0 .  saccharomyces boulardii (FLORASTOR) 250 MG capsule, Take 250 mg by mouth 2 (two) times daily., Disp: , Rfl:  .  vitamin C (ASCORBIC ACID) 500 MG tablet, Take 500 mg by mouth daily., Disp: , Rfl:  Social History   Socioeconomic History  . Marital status: Single    Spouse name: Not on file  . Number of children: 2  . Years of education: Not on file  . Highest education level: Not on file  Social Needs  . Financial resource strain: Not on file  . Food insecurity - worry: Not on file  . Food insecurity - inability: Not on file  . Transportation needs - medical: Not on file  . Transportation needs - non-medical: Not on file  Occupational History  . Not on file  Tobacco Use  . Smoking status: Never Smoker  . Smokeless tobacco: Never Used  Substance and Sexual Activity  . Alcohol use: No  . Drug use: No  . Sexual activity: Not on file  Other Topics Concern  . Not on file  Social History Narrative  . Not on file   Family History  Problem Relation Age of Onset  . Arthritis Unknown   . Heart disease Unknown   . Cancer Unknown   . Diabetes Unknown   . OCD Unknown     Objective: Office vital signs reviewed. BP 112/66   Pulse 67   Temp 98.5 F (36.9 C) (Oral)   Ht 5\' 4"  (1.626 m)   Wt 234 lb 12.8 oz (106.5 kg)   SpO2 100%   BMI 40.30 kg/m   Physical Examination:  General: Awake, alert, morbidly obese, No acute distress HEENT:     Neck: No masses palpated. No JVD.    Eyes: sclera white.    Throat:  moist mucus membranes Cardio: regular rate and rhythm, S1S2 heard, soft systolic appreciated along the left sternal border.  No radiation to carotids. Pulm: clear to auscultation bilaterally, no wheezes, rhonchi or rales; normal work of breathing on room air MSK: Ambulance with walker Extremities: Warm, +1 distal pulses, 1+ pitting edema appreciated to bilateral knees.  No tenderness to palpation of the calves.  Calves are equal in girth.  Negative Homans sign.  Skin : some venous stasis skin changes noted but no secondary infection  Assessment/ Plan: 72 y.o. female   1. Bilateral leg edema This is likely venous stasis given immobility, obesity and excessive salt intake.  No appreciable fluid overload otherwise.  However, given her history of unknown arrhythmia and newly found heart murmur, I think it pertinent to rule out cardiac etiology.  Her August 2007 echocardiogram report was reviewed.  At that time she had normal systolic function but it was thought that she had high cardiac output with high velocities.  Mild left atrial dilation noted.  No evidence of DVT on exam.  Check BMP, CBC, BNP, TSH and refer to cardiology for consideration of ultrasound.  Recommend CeraVe for moisturization of lower extremities.  She was prescribed compression hose and I recommended that she consider having this filled at 1 of the local pharmacies so that she may be fitted for these.  Will contact patient with results of labs.  If it does appear that she has heart mediated fluid overload, would recommend that PCP add Lasix while she awaits workup with cardiology. - Basic Metabolic Panel - CBC with Differential - Brain natriuretic peptide - TSH - Ambulatory referral to Cardiology  2. Newly recognized heart murmur See above. - Ambulatory referral to Cardiology   Orders Placed This Encounter  Procedures  . Basic Metabolic Panel  . CBC with Differential  . Brain natriuretic peptide  . TSH  . Ambulatory  referral to Cardiology    Referral Priority:   Routine    Referral Type:   Consultation    Referral Reason:   Specialty Services Required    Requested Specialty:   Cardiology    Number of Visits Requested:   [place], [place] [phone]

## 2017-07-30 ENCOUNTER — Ambulatory Visit (INDEPENDENT_AMBULATORY_CARE_PROVIDER_SITE_OTHER): Payer: Medicare Other | Admitting: Family Medicine

## 2017-07-30 ENCOUNTER — Ambulatory Visit: Admitting: Family Medicine

## 2017-07-30 ENCOUNTER — Encounter: Payer: Self-pay | Admitting: Family Medicine

## 2017-07-30 VITALS — BP 112/66 | HR 67 | Temp 98.5°F | Ht 64.0 in | Wt 234.8 lb

## 2017-07-30 DIAGNOSIS — R011 Cardiac murmur, unspecified: Secondary | ICD-10-CM

## 2017-07-30 DIAGNOSIS — R600 Localized edema: Secondary | ICD-10-CM

## 2017-07-30 NOTE — Patient Instructions (Signed)
I think this is from something called venous stasis which means you have fluid pooling in your legs from eating too much salt, the effects of gravity and leaky blood vessels.  The leaky blood vessels can happen as we age.  We discussed that elevating her legs will help reduce your leg swelling.  I have prescribed you compression hose to use daily to reduce your leg swelling.  I have also referred you to cardiology for further evaluation to make sure that this is not coming from your heart.  DASH Eating Plan DASH stands for "Dietary Approaches to Stop Hypertension." The DASH eating plan is a healthy eating plan that has been shown to reduce high blood pressure (hypertension). It may also reduce your risk for type 2 diabetes, heart disease, and stroke. The DASH eating plan may also help with weight loss. What are tips for following this plan? General guidelines  Avoid eating more than 2,300 mg (milligrams) of salt (sodium) a day. If you have hypertension, you may need to reduce your sodium intake to 1,500 mg a day.  Limit alcohol intake to no more than 1 drink a day for nonpregnant women and 2 drinks a day for men. One drink equals 12 oz of beer, 5 oz of wine, or 1 oz of hard liquor.  Work with your health care provider to maintain a healthy body weight or to lose weight. Ask what an ideal weight is for you.  Get at least 30 minutes of exercise that causes your heart to beat faster (aerobic exercise) most days of the week. Activities may include walking, swimming, or biking.  Work with your health care provider or diet and nutrition specialist (dietitian) to adjust your eating plan to your individual calorie needs. Reading food labels  Check food labels for the amount of sodium per serving. Choose foods with less than 5 percent of the Daily Value of sodium. Generally, foods with less than 300 mg of sodium per serving fit into this eating plan.  To find whole grains, look for the word "whole" as  the first word in the ingredient list. Shopping  Buy products labeled as "low-sodium" or "no salt added."  Buy fresh foods. Avoid canned foods and premade or frozen meals. Cooking  Avoid adding salt when cooking. Use salt-free seasonings or herbs instead of table salt or sea salt. Check with your health care provider or pharmacist before using salt substitutes.  Do not fry foods. Cook foods using healthy methods such as baking, boiling, grilling, and broiling instead.  Cook with heart-healthy oils, such as olive, canola, soybean, or sunflower oil. Meal planning   Eat a balanced diet that includes: ? 5 or more servings of fruits and vegetables each day. At each meal, try to fill half of your plate with fruits and vegetables. ? Up to 6-8 servings of whole grains each day. ? Less than 6 oz of lean meat, poultry, or fish each day. A 3-oz serving of meat is about the same size as a deck of cards. One egg equals 1 oz. ? 2 servings of low-fat dairy each day. ? A serving of nuts, seeds, or beans 5 times each week. ? Heart-healthy fats. Healthy fats called Omega-3 fatty acids are found in foods such as flaxseeds and coldwater fish, like sardines, salmon, and mackerel.  Limit how much you eat of the following: ? Canned or prepackaged foods. ? Food that is high in trans fat, such as fried foods. ? Food that is  high in saturated fat, such as fatty meat. ? Sweets, desserts, sugary drinks, and other foods with added sugar. ? Full-fat dairy products.  Do not salt foods before eating.  Try to eat at least 2 vegetarian meals each week.  Eat more home-cooked food and less restaurant, buffet, and fast food.  When eating at a restaurant, ask that your food be prepared with less salt or no salt, if possible. What foods are recommended? The items listed may not be a complete list. Talk with your dietitian about what dietary choices are best for you. Grains Whole-grain or whole-wheat bread.  Whole-grain or whole-wheat pasta. Brown rice. [place]. Bulgur. Whole-grain and low-sodium cereals. Pita bread. Low-fat, low-sodium crackers. Whole-wheat flour tortillas. Vegetables Fresh or frozen vegetables (raw, steamed, roasted, or grilled). Low-sodium or reduced-sodium tomato and vegetable juice. Low-sodium or reduced-sodium tomato sauce and tomato paste. Low-sodium or reduced-sodium canned vegetables. Fruits All fresh, dried, or frozen fruit. Canned fruit in natural juice (without added sugar). Meat and other protein foods Skinless chicken or [place]. Ground chicken or [place]. Pork with fat trimmed off. Fish and seafood. Egg whites. Dried beans, peas, or lentils. Unsalted nuts, nut butters, and seeds. Unsalted canned beans. Lean cuts of beef with fat trimmed off. Low-sodium, lean deli meat. Dairy Low-fat (1%) or fat-free (skim) milk. Fat-free, low-fat, or reduced-fat cheeses. Nonfat, low-sodium ricotta or cottage cheese. Low-fat or nonfat yogurt. Low-fat, low-sodium cheese. Fats and oils Soft margarine without trans fats. Vegetable oil. Low-fat, reduced-fat, or light mayonnaise and salad dressings (reduced-sodium). Canola, safflower, olive, soybean, and sunflower oils. Avocado. Seasoning and other foods Herbs. Spices. Seasoning mixes without salt. Unsalted popcorn and pretzels. Fat-free sweets. What foods are not recommended? The items listed may not be a complete list. Talk with your dietitian about what dietary choices are best for you. Grains Baked goods made with fat, such as croissants, muffins, or some breads. Dry pasta or rice meal packs. Vegetables Creamed or fried vegetables. Vegetables in a cheese sauce. Regular canned vegetables (not low-sodium or reduced-sodium). Regular canned tomato sauce and paste (not low-sodium or reduced-sodium). Regular tomato and vegetable juice (not low-sodium or reduced-sodium). Angie Fava. Olives. Fruits Canned fruit in a light or heavy syrup.  Fried fruit. Fruit in cream or butter sauce. Meat and other protein foods Fatty cuts of meat. Ribs. Fried meat. Berniece Salines. Sausage. Bologna and other processed lunch meats. Salami. Fatback. Hotdogs. Bratwurst. Salted nuts and seeds. Canned beans with added salt. Canned or smoked fish. Whole eggs or egg yolks. Chicken or [place] with skin. Dairy Whole or 2% milk, cream, and half-and-half. Whole or full-fat cream cheese. Whole-fat or sweetened yogurt. Full-fat cheese. Nondairy creamers. Whipped toppings. Processed cheese and cheese spreads. Fats and oils Butter. Stick margarine. Lard. Shortening. Ghee. Bacon fat. Tropical oils, such as coconut, palm kernel, or palm oil. Seasoning and other foods Salted popcorn and pretzels. Onion salt, garlic salt, seasoned salt, table salt, and sea salt. Worcestershire sauce. Tartar sauce. Barbecue sauce. Teriyaki sauce. Soy sauce, including reduced-sodium. Steak sauce. Canned and packaged gravies. Fish sauce. Oyster sauce. Cocktail sauce. Horseradish that you find on the shelf. Ketchup. Mustard. Meat flavorings and tenderizers. Bouillon cubes. Hot sauce and Tabasco sauce. Premade or packaged marinades. Premade or packaged taco seasonings. Relishes. Regular salad dressings. Where to find more information:  National Heart, Lung, and [place]: https://wilson-eaton.com/  American Heart Association: www.heart.org Summary  The DASH eating plan is a healthy eating plan that has been shown to reduce high blood pressure (hypertension). It  may also reduce your risk for type 2 diabetes, heart disease, and stroke.  With the DASH eating plan, you should limit salt (sodium) intake to 2,300 mg a day. If you have hypertension, you may need to reduce your sodium intake to 1,500 mg a day.  When on the DASH eating plan, aim to eat more fresh fruits and vegetables, whole grains, lean proteins, low-fat dairy, and heart-healthy fats.  Work with your health care provider or diet and  nutrition specialist (dietitian) to adjust your eating plan to your individual calorie needs. This information is not intended to replace advice given to you by your health care provider. Make sure you discuss any questions you have with your health care provider. Document Released: 05/02/2011 Document Revised: 05/06/2016 Document Reviewed: 05/06/2016 Elsevier Interactive Patient Education  2018 [employer].   Edema Edema is an abnormal buildup of fluids in your bodytissues. Edema is somewhatdependent on gravity to pull the fluid to the lowest place in your body. That makes the condition more common in the legs and thighs (lower extremities). Painless swelling of the feet and ankles is common and becomes more likely as you get older. It is also common in looser tissues, like around your eyes. When the affected area is squeezed, the fluid may move out of that spot and leave a dent for a few moments. This dent is called pitting. What are the causes? There are many possible causes of edema. Eating too much salt and being on your feet or sitting for a long time can cause edema in your legs and ankles. Hot weather may make edema worse. Common medical causes of edema include:  Heart failure.  Liver disease.  Kidney disease.  Weak blood vessels in your legs.  Cancer.  An injury.  Pregnancy.  Some medications.  Obesity.  What are the signs or symptoms? Edema is usually painless.Your skin may look swollen or shiny. How is this diagnosed? Your health care provider may be able to diagnose edema by asking about your medical history and doing a physical exam. You may need to have tests such as X-rays, an electrocardiogram, or blood tests to check for medical conditions that may cause edema. How is this treated? Edema treatment depends on the cause. If you have heart, liver, or kidney disease, you need the treatment appropriate for these conditions. General treatment may  include:  Elevation of the affected body part above the level of your heart.  Compression of the affected body part. Pressure from elastic bandages or support stockings squeezes the tissues and forces fluid back into the blood vessels. This keeps fluid from entering the tissues.  Restriction of fluid and salt intake.  Use of a water pill (diuretic). These medications are appropriate only for some types of edema. They pull fluid out of your body and make you urinate more often. This gets rid of fluid and reduces swelling, but diuretics can have side effects. Only use diuretics as directed by your health care provider.  Follow these instructions at home:  Keep the affected body part above the level of your heart when you are lying down.  Do not sit still or stand for prolonged periods.  Do not put anything directly under your knees when lying down.  Do not wear constricting clothing or garters on your upper legs.  Exercise your legs to work the fluid back into your blood vessels. This may help the swelling go down.  Wear elastic bandages or support stockings to reduce  ankle swelling as directed by your health care provider.  Eat a low-salt diet to reduce fluid if your health care provider recommends it.  Only take medicines as directed by your health care provider. Contact a health care provider if:  Your edema is not responding to treatment.  You have heart, liver, or kidney disease and notice symptoms of edema.  You have edema in your legs that does not improve after elevating them.  You have sudden and unexplained weight gain. Get help right away if:  You develop shortness of breath or chest pain.  You cannot breathe when you lie down.  You develop pain, redness, or warmth in the swollen areas.  You have heart, liver, or kidney disease and suddenly get edema.  You have a fever and your symptoms suddenly get worse. This information is not intended to replace advice  given to you by your health care provider. Make sure you discuss any questions you have with your health care provider. Document Released: 05/13/2005 Document Revised: 10/19/2015 Document Reviewed: 03/05/2013 Elsevier Interactive Patient Education  2017 [employer].

## 2017-07-31 LAB — BASIC METABOLIC PANEL
BUN / CREAT RATIO: 16 (ref 12–28)
BUN: 18 mg/dL (ref 8–27)
CO2: 26 mmol/L (ref 20–29)
Calcium: 9.4 mg/dL (ref 8.7–10.3)
Chloride: 100 mmol/L (ref 96–106)
Creatinine, Ser: 1.14 mg/dL — ABNORMAL HIGH (ref 0.57–1.00)
GFR, EST AFRICAN AMERICAN: 56 mL/min/{1.73_m2} — AB (ref >59)
GFR, EST NON AFRICAN AMERICAN: 49 mL/min/{1.73_m2} — AB (ref >59)
Glucose: 104 mg/dL — ABNORMAL HIGH (ref 65–99)
POTASSIUM: 4 mmol/L (ref 3.5–5.2)
SODIUM: 140 mmol/L (ref 134–144)

## 2017-07-31 LAB — CBC WITH DIFFERENTIAL/PLATELET
BASOS: 1 %
Basophils Absolute: 0 10*3/uL (ref 0.0–0.2)
EOS (ABSOLUTE): 0.2 10*3/uL (ref 0.0–0.4)
Eos: 3 %
HEMOGLOBIN: 11.9 g/dL (ref 11.1–15.9)
Hematocrit: 36.5 % (ref 34.0–46.6)
IMMATURE GRANS (ABS): 0 10*3/uL (ref 0.0–0.1)
Immature Granulocytes: 0 %
LYMPHS ABS: 2 10*3/uL (ref 0.7–3.1)
Lymphs: 25 %
MCH: 30 pg (ref 26.6–33.0)
MCHC: 32.6 g/dL (ref 31.5–35.7)
MCV: 92 fL (ref 79–97)
Monocytes Absolute: 0.8 10*3/uL (ref 0.1–0.9)
Monocytes: 9 %
NEUTROS ABS: 5.1 10*3/uL (ref 1.4–7.0)
Neutrophils: 62 %
Platelets: 380 10*3/uL — ABNORMAL HIGH (ref 150–379)
RBC: 3.97 x10E6/uL (ref 3.77–5.28)
RDW: 14.5 % (ref 12.3–15.4)
WBC: 8.2 10*3/uL (ref 3.4–10.8)

## 2017-07-31 LAB — BRAIN NATRIURETIC PEPTIDE: BNP: 100.3 pg/mL — ABNORMAL HIGH (ref 0.0–100.0)

## 2017-07-31 LAB — TSH: TSH: 3.03 u[IU]/mL (ref 0.450–4.500)

## 2017-08-11 ENCOUNTER — Encounter: Payer: Self-pay | Admitting: Physician Assistant

## 2017-08-11 ENCOUNTER — Ambulatory Visit (INDEPENDENT_AMBULATORY_CARE_PROVIDER_SITE_OTHER): Payer: Medicare Other | Admitting: Physician Assistant

## 2017-08-11 VITALS — BP 94/56 | HR 57 | Temp 97.2°F | Ht 64.0 in | Wt 222.0 lb

## 2017-08-11 DIAGNOSIS — R609 Edema, unspecified: Secondary | ICD-10-CM

## 2017-08-11 DIAGNOSIS — E559 Vitamin D deficiency, unspecified: Secondary | ICD-10-CM

## 2017-08-11 DIAGNOSIS — F419 Anxiety disorder, unspecified: Secondary | ICD-10-CM | POA: Diagnosis not present

## 2017-08-11 DIAGNOSIS — I700 Atherosclerosis of aorta: Secondary | ICD-10-CM

## 2017-08-11 DIAGNOSIS — Z0000 Encounter for general adult medical examination without abnormal findings: Secondary | ICD-10-CM

## 2017-08-11 DIAGNOSIS — F332 Major depressive disorder, recurrent severe without psychotic features: Secondary | ICD-10-CM | POA: Diagnosis not present

## 2017-08-11 DIAGNOSIS — E785 Hyperlipidemia, unspecified: Secondary | ICD-10-CM | POA: Diagnosis not present

## 2017-08-11 DIAGNOSIS — D649 Anemia, unspecified: Secondary | ICD-10-CM

## 2017-08-11 DIAGNOSIS — R5383 Other fatigue: Secondary | ICD-10-CM

## 2017-08-11 DIAGNOSIS — L0390 Cellulitis, unspecified: Secondary | ICD-10-CM | POA: Diagnosis not present

## 2017-08-11 MED ORDER — FLUOXETINE HCL 20 MG PO CAPS: ORAL_CAPSULE | ORAL | 11 refills | Status: DC

## 2017-08-11 MED ORDER — LEVOTHYROXINE SODIUM 50 MCG PO TABS: ORAL_TABLET | ORAL | 3 refills | Status: DC

## 2017-08-11 MED ORDER — TELMISARTAN 40 MG PO TABS: 40.0000 mg | ORAL_TABLET | Freq: Every day | ORAL | 2 refills | Status: DC

## 2017-08-11 MED ORDER — ALPRAZOLAM 0.5 MG PO TABS: 0.5000 mg | ORAL_TABLET | Freq: Three times a day (TID) | ORAL | 2 refills | Status: DC | PRN

## 2017-08-11 MED ORDER — DOXYCYCLINE HYCLATE 100 MG PO TABS: 100.0000 mg | ORAL_TABLET | Freq: Two times a day (BID) | ORAL | 0 refills | Status: DC

## 2017-08-11 MED ORDER — PREDNISONE 10 MG PO TABS: 10.0000 mg | ORAL_TABLET | Freq: Every day | ORAL | 2 refills | Status: DC

## 2017-08-11 MED ORDER — FUROSEMIDE 20 MG PO TABS: 20.0000 mg | ORAL_TABLET | Freq: Every day | ORAL | 3 refills | Status: DC

## 2017-08-11 NOTE — Patient Instructions (Addendum)
Dr. Kandice Moos, store brand

## 2017-08-12 DIAGNOSIS — D649 Anemia, unspecified: Secondary | ICD-10-CM | POA: Insufficient documentation

## 2017-08-12 DIAGNOSIS — E559 Vitamin D deficiency, unspecified: Secondary | ICD-10-CM | POA: Insufficient documentation

## 2017-08-12 LAB — CBC WITH DIFFERENTIAL/PLATELET
Basophils Absolute: 0.1 10*3/uL (ref 0.0–0.2)
Basos: 1 %
EOS (ABSOLUTE): 0.2 10*3/uL (ref 0.0–0.4)
Eos: 2 %
Hematocrit: 38.2 % (ref 34.0–46.6)
Hemoglobin: 12.6 g/dL (ref 11.1–15.9)
IMMATURE GRANULOCYTES: 0 %
Immature Grans (Abs): 0 10*3/uL (ref 0.0–0.1)
LYMPHS ABS: 2.3 10*3/uL (ref 0.7–3.1)
Lymphs: 27 %
MCH: 29.9 pg (ref 26.6–33.0)
MCHC: 33 g/dL (ref 31.5–35.7)
MCV: 91 fL (ref 79–97)
MONOS ABS: 0.6 10*3/uL (ref 0.1–0.9)
Monocytes: 7 %
NEUTROS PCT: 63 %
Neutrophils Absolute: 5.5 10*3/uL (ref 1.4–7.0)
PLATELETS: 428 10*3/uL — AB (ref 150–379)
RBC: 4.22 x10E6/uL (ref 3.77–5.28)
RDW: 14.3 % (ref 12.3–15.4)
WBC: 8.7 10*3/uL (ref 3.4–10.8)

## 2017-08-12 LAB — CMP14+EGFR
A/G RATIO: 1.6 (ref 1.2–2.2)
ALK PHOS: 107 IU/L (ref 39–117)
ALT: 15 IU/L (ref 0–32)
AST: 22 IU/L (ref 0–40)
Albumin: 4.4 g/dL (ref 3.5–4.8)
BUN/Creatinine Ratio: 22 (ref 12–28)
BUN: 35 mg/dL — ABNORMAL HIGH (ref 8–27)
Bilirubin Total: 0.5 mg/dL (ref 0.0–1.2)
CALCIUM: 9.9 mg/dL (ref 8.7–10.3)
CO2: 21 mmol/L (ref 20–29)
CREATININE: 1.62 mg/dL — AB (ref 0.57–1.00)
Chloride: 94 mmol/L — ABNORMAL LOW (ref 96–106)
GFR calc Af Amer: 37 mL/min/{1.73_m2} — ABNORMAL LOW (ref >59)
GFR, EST NON AFRICAN AMERICAN: 32 mL/min/{1.73_m2} — AB (ref >59)
GLOBULIN, TOTAL: 2.7 g/dL (ref 1.5–4.5)
Glucose: 90 mg/dL (ref 65–99)
POTASSIUM: 4.2 mmol/L (ref 3.5–5.2)
SODIUM: 133 mmol/L — AB (ref 134–144)
Total Protein: 7.1 g/dL (ref 6.0–8.5)

## 2017-08-12 LAB — LIPID PANEL
CHOL/HDL RATIO: 3 ratio (ref 0.0–4.4)
Cholesterol, Total: 157 mg/dL (ref 100–199)
HDL: 52 mg/dL (ref >39)
LDL Calculated: 82 mg/dL (ref 0–99)
TRIGLYCERIDES: 117 mg/dL (ref 0–149)
VLDL Cholesterol Cal: 23 mg/dL (ref 5–40)

## 2017-08-12 LAB — THYROID PANEL WITH TSH
Free Thyroxine Index: 2.4 (ref 1.2–4.9)
T3 Uptake Ratio: 24 % (ref 24–39)
T4, Total: 9.9 ug/dL (ref 4.5–12.0)
TSH: 1.81 u[IU]/mL (ref 0.450–4.500)

## 2017-08-12 LAB — VITAMIN D 25 HYDROXY (VIT D DEFICIENCY, FRACTURES): VIT D 25 HYDROXY: 19.1 ng/mL — AB (ref 30.0–100.0)

## 2017-08-12 NOTE — Progress Notes (Signed)
BP (!) 94/56 (BP Location: Left Arm)   Pulse (!) 57   Temp (!) 97.2 F (36.2 C) (Oral)   Ht 5' 4" (1.626 m)   Wt 222 lb (100.7 kg)   BMI 38.11 kg/m    Subjective:    Patient ID: [name], female    DOB: 03/20/1946, 72 y.o.   MRN: [id number]  HPI: [name] is a 72 y.o. female presenting on 08/11/2017 for Follow-up (edema - some better) Patient comes in for recheck on her conditions.  She does have increased edema in her lower legs.  It is not as severe as it has been in the past.  She notes that she is up over 5 pounds at this time.  There is some redness in her lower legs but she denies any drainage or pus from the legs.  She denies any fever or chills.  Her chronic conditions do include anxiety, severe depression, anemia, vitamin D deficiency.  She also has very severe arthritis.  Her medications are revealed today.  We will plan lab work as soon as possible.   Past Medical History:  Diagnosis Date  . Allergy   . Bronchitis, chronic ([place])   . Chronic anxiety   . Complication of anesthesia    hard to wake up  . COPD (chronic obstructive pulmonary disease) ([place])   . Depression   . Esophageal reflux   . Headache(784.0)   . Hyperlipidemia   . Hypertension   . Hypothyroidism   . Obesity   . Osteoporosis   . Overactive bladder   . PVC's (premature ventricular contractions)   . Thyroid disease   . Vitamin D deficiency disease    Relevant past medical, surgical, family and social history reviewed and updated as indicated. Interim medical history since our last visit reviewed. Allergies and medications reviewed and updated. DATA REVIEWED: CHART IN EPIC  Family History reviewed for pertinent findings.  Review of Systems  Constitutional: Negative.   HENT: Negative.   Eyes: Negative.   Respiratory: Negative.   Cardiovascular: Positive for palpitations and leg swelling. Negative for chest pain.  Gastrointestinal: Negative.  Negative for abdominal pain and  constipation.  Genitourinary: Negative.   Musculoskeletal: Positive for arthralgias, back pain, gait problem, joint swelling and myalgias.    Allergies as of 08/11/2017      Reactions   Celebrex [celecoxib] Swelling   Keflet [cephalexin] Swelling   Kenalog [triamcinolone Acetonide]    unknown   Lisinopril Other (See Comments)   unknown   Penicillins Hives, Swelling   Sulfa Antibiotics Swelling      Medication List        Accurate as of 08/11/17 11:59 PM. Always use your most recent med list.          acetaminophen 650 MG CR tablet Commonly known as:  TYLENOL Take 650 mg by mouth every 8 (eight) hours as needed for pain.   ALPRAZolam 0.5 MG tablet Commonly known as:  XANAX Take 1 tablet (0.5 mg total) by mouth 3 (three) times daily as needed for anxiety.   aspirin 81 MG EC tablet Take 1 tablet (81 mg total) by mouth daily. Swallow whole.   calcium carbonate 1250 (500 Ca) MG tablet Commonly known as:  OS-CAL - dosed in mg of elemental calcium Take 1 tablet by mouth.   cholecalciferol 1000 units tablet Commonly known as:  VITAMIN D Take 1,000 Units by mouth daily.   clobetasol cream 0.05 % Commonly known as:  TEMOVATE Apply 1 application topically 2 (two) times daily.   doxycycline 100 MG tablet Commonly known as:  VIBRA-TABS Take 1 tablet (100 mg total) by mouth 2 (two) times daily. 1 po bid   FLUoxetine 20 MG capsule Commonly known as:  PROZAC TAKE 1 TO 2 CAPSULES DAILY   fluticasone 50 MCG/ACT nasal spray Commonly known as:  FLONASE Place 2 sprays into both nostrils daily.   furosemide 20 MG tablet Commonly known as:  LASIX Take 1 tablet (20 mg total) by mouth daily.   levothyroxine 50 MCG tablet Commonly known as:  SYNTHROID, LEVOTHROID TAKE ONE (1) TABLET EACH DAY   meclizine 25 MG tablet Commonly known as:  ANTIVERT Take 1 tablet (25 mg total) by mouth 3 (three) times daily as needed for dizziness.   multivitamin tablet Take 1 tablet by mouth  daily.   omeprazole-sodium bicarbonate 40-1100 MG capsule Commonly known as:  ZEGERID Take 1 capsule by mouth daily before breakfast.   oxyCODONE-acetaminophen 5-325 MG tablet Commonly known as:  PERCOCET/ROXICET Take 1 tablet by mouth every 6 (six) hours as needed.   predniSONE 10 MG tablet Commonly known as:  DELTASONE Take 1 tablet (10 mg total) by mouth daily with breakfast.   saccharomyces boulardii 250 MG capsule Commonly known as:  FLORASTOR Take 250 mg by mouth 2 (two) times daily.   telmisartan 40 MG tablet Commonly known as:  MICARDIS Take 1 tablet (40 mg total) by mouth daily.   vitamin C 500 MG tablet Commonly known as:  ASCORBIC ACID Take 500 mg by mouth daily.          Objective:    BP (!) 94/56 (BP Location: Left Arm)   Pulse (!) 57   Temp (!) 97.2 F (36.2 C) (Oral)   Ht 5' 4" (1.626 m)   Wt 222 lb (100.7 kg)   BMI 38.11 kg/m   Allergies  Allergen Reactions  . Celebrex [Celecoxib] Swelling  . Keflet [Cephalexin] Swelling  . Kenalog [Triamcinolone Acetonide]     unknown  . Lisinopril Other (See Comments)    unknown  . Penicillins Hives and Swelling  . Sulfa Antibiotics Swelling    Wt Readings from Last 3 Encounters:  08/11/17 222 lb (100.7 kg)  07/30/17 234 lb 12.8 oz (106.5 kg)  02/17/17 229 lb 12.8 oz (104.2 kg)    Physical Exam  Constitutional: She is oriented to person, place, and time. She appears well-developed and well-nourished.  HENT:  Head: Normocephalic and atraumatic.  Eyes: Conjunctivae and EOM are normal. Pupils are equal, round, and reactive to light.  Cardiovascular: Normal rate, regular rhythm, normal heart sounds and intact distal pulses.  2 + piting edema lower legs bilaterally  Pulmonary/Chest: Effort normal and breath sounds normal.  Abdominal: Soft. Bowel sounds are normal.  Neurological: She is alert and oriented to person, place, and time. She has normal reflexes.  Skin: Skin is warm and dry. No rash noted. Rash  is not macular, not pustular and not vesicular. There is erythema.     Psychiatric: She has a normal mood and affect. Her behavior is normal. Judgment and thought content normal.    Results for orders placed or performed in visit on 08/11/17  CBC with Differential/Platelet  Result Value Ref Range   WBC 8.7 3.4 - 10.8 x10E3/uL   RBC 4.22 3.77 - 5.28 x10E6/uL   Hemoglobin 12.6 11.1 - 15.9 g/dL   Hematocrit 38.2 34.0 - 46.6 %   MCV 91 79 -  97 fL   MCH 29.9 26.6 - 33.0 pg   MCHC 33.0 31.5 - 35.7 g/dL   RDW 14.3 12.3 - 15.4 %   Platelets 428 (H) 150 - 379 x10E3/uL   Neutrophils 63 Not Estab. %   Lymphs 27 Not Estab. %   Monocytes 7 Not Estab. %   Eos 2 Not Estab. %   Basos 1 Not Estab. %   Neutrophils Absolute 5.5 1.4 - 7.0 x10E3/uL   Lymphocytes Absolute 2.3 0.7 - 3.1 x10E3/uL   Monocytes Absolute 0.6 0.1 - 0.9 x10E3/uL   EOS (ABSOLUTE) 0.2 0.0 - 0.4 x10E3/uL   Basophils Absolute 0.1 0.0 - 0.2 x10E3/uL   Immature Granulocytes 0 Not Estab. %   Immature Grans (Abs) 0.0 0.0 - 0.1 x10E3/uL  CMP14+EGFR  Result Value Ref Range   Glucose 90 65 - 99 mg/dL   BUN 35 (H) 8 - 27 mg/dL   Creatinine, Ser 1.62 (H) 0.57 - 1.00 mg/dL   GFR calc non Af Amer 32 (L) >59 mL/min/1.73   GFR calc Af Amer 37 (L) >59 mL/min/1.73   BUN/Creatinine Ratio 22 12 - 28   Sodium 133 (L) 134 - 144 mmol/L   Potassium 4.2 3.5 - 5.2 mmol/L   Chloride 94 (L) 96 - 106 mmol/L   CO2 21 20 - 29 mmol/L   Calcium 9.9 8.7 - 10.3 mg/dL   Total Protein 7.1 6.0 - 8.5 g/dL   Albumin 4.4 3.5 - 4.8 g/dL   Globulin, Total 2.7 1.5 - 4.5 g/dL   Albumin/Globulin Ratio 1.6 1.2 - 2.2   Bilirubin Total 0.5 0.0 - 1.2 mg/dL   Alkaline Phosphatase 107 39 - 117 IU/L   AST 22 0 - 40 IU/L   ALT 15 0 - 32 IU/L  Lipid panel  Result Value Ref Range   Cholesterol, Total 157 100 - 199 mg/dL   Triglycerides 117 0 - 149 mg/dL   HDL 52 >39 mg/dL   VLDL Cholesterol Cal 23 5 - 40 mg/dL   LDL Calculated 82 0 - 99 mg/dL   Chol/HDL Ratio  3.0 0.0 - 4.4 ratio  Thyroid Panel With TSH  Result Value Ref Range   TSH 1.810 0.450 - 4.500 uIU/mL   T4, Total 9.9 4.5 - 12.0 ug/dL   T3 Uptake Ratio 24 24 - 39 %   Free Thyroxine Index 2.4 1.2 - 4.9  Vitamin D, 25-hydroxy  Result Value Ref Range   Vit D, 25-Hydroxy 19.1 (L) 30.0 - 100.0 ng/mL      Assessment & Plan:   1. Edema, unspecified type - furosemide (LASIX) 20 MG tablet; Take 1 tablet (20 mg total) by mouth daily.  Dispense: 30 tablet; Refill: 3 - Vitamin D, 25-hydroxy  2. Cellulitis, unspecified cellulitis site - doxycycline (VIBRA-TABS) 100 MG tablet; Take 1 tablet (100 mg total) by mouth 2 (two) times daily. 1 po bid  Dispense: 20 tablet; Refill: 0  3. Anxiety - ALPRAZolam (XANAX) 0.5 MG tablet; Take 1 tablet (0.5 mg total) by mouth 3 (three) times daily as needed for anxiety.  Dispense: 90 tablet; Refill: 2  4. Severe episode of recurrent major depressive disorder, without psychotic features ([place]) - FLUoxetine (PROZAC) 20 MG capsule; TAKE 1 TO 2 CAPSULES DAILY  Dispense: 60 capsule; Refill: 11  5. Other fatigue - Thyroid Panel With TSH - Vitamin D, 25-hydroxy  6. Well adult exam - CBC with Differential/Platelet - CMP14+EGFR - Lipid panel - Thyroid Panel With TSH  7.  Anemia, unspecified type  8. Vitamin D deficiency - Vitamin D, 25-hydroxy  9. Thoracic aorta atherosclerosis ([place])    Continue all other maintenance medications as listed above.  Follow up plan: Return in about 4 weeks (around 09/08/2017) for recheck bp.  Educational handout given for [place] PA-C [facility] [address]  [place], [place] [address] [phone]   08/12/2017, 1:41 PM

## 2017-08-13 ENCOUNTER — Telehealth: Payer: Self-pay | Admitting: Physician Assistant

## 2017-08-13 NOTE — Telephone Encounter (Signed)
No answer, mailbox full

## 2017-08-13 NOTE — Telephone Encounter (Signed)
Please review and advise.

## 2017-08-13 NOTE — Telephone Encounter (Signed)
She was prescribed doxycycline and lasix. Which one does she think?

## 2017-08-19 ENCOUNTER — Telehealth: Payer: Self-pay | Admitting: Physician Assistant

## 2017-08-19 NOTE — Telephone Encounter (Signed)
lmtcb

## 2017-08-20 NOTE — Telephone Encounter (Signed)
Refer to lab notes 

## 2017-08-20 NOTE — Telephone Encounter (Signed)
attempts have been made to contact patient and no call back.

## 2017-08-26 NOTE — Telephone Encounter (Signed)
Pt notified of results Verbalizes understanding 

## 2017-08-26 NOTE — Telephone Encounter (Signed)
-----   Message from Terald Sleeper, PA-C sent at 08/12/2017 12:44 PM EDT ----- Overall your labs look good.  Your CBC, comprehensive, lipid and thyroid are all normal.  Your vitamin D is low at 19.1.  You need to be taking vitamin D 5000 IUs 1 daily over-the-counter and we will recheck this in 6 months.

## 2017-09-08 DIAGNOSIS — M25561 Pain in right knee: Secondary | ICD-10-CM | POA: Diagnosis not present

## 2017-09-08 DIAGNOSIS — M25562 Pain in left knee: Secondary | ICD-10-CM | POA: Diagnosis not present

## 2017-09-08 DIAGNOSIS — M170 Bilateral primary osteoarthritis of knee: Secondary | ICD-10-CM | POA: Diagnosis not present

## 2017-09-09 ENCOUNTER — Ambulatory Visit: Payer: Medicare Other | Admitting: Physician Assistant

## 2017-09-22 ENCOUNTER — Ambulatory Visit (INDEPENDENT_AMBULATORY_CARE_PROVIDER_SITE_OTHER): Payer: Medicare Other | Admitting: Cardiology

## 2017-09-22 ENCOUNTER — Encounter: Payer: Self-pay | Admitting: Cardiology

## 2017-09-22 VITALS — BP 135/81 | HR 61 | Ht 64.0 in | Wt 217.6 lb

## 2017-09-22 DIAGNOSIS — I10 Essential (primary) hypertension: Secondary | ICD-10-CM

## 2017-09-22 DIAGNOSIS — R600 Localized edema: Secondary | ICD-10-CM

## 2017-09-22 DIAGNOSIS — R011 Cardiac murmur, unspecified: Secondary | ICD-10-CM | POA: Diagnosis not present

## 2017-09-22 NOTE — Progress Notes (Addendum)
Clinical Summary [name] is a 72 y.o.female seen as new consult, referred by PA Jones for leg edema   1. Leg edema - recent leg edema and weight loss. Started on lasix by pcp -11/2015 LE venous US [place]: no DVT  - reports several year history, worst recently over the last month. On lasix x 3 weeks.  - somewhat imrpoved with lasix, cutting on sodium.  - no recent SOB/DOE. No orthopnea.   2. COPD - followed by pcp   3. HTN - compliant with meds   4. AKI - uptrend in Cr to 1.6 from lab review back in 07/2017.  - had been on mobic at the time she reports  Past Medical History:  Diagnosis Date  . Allergy   . Bronchitis, chronic ([place])   . Chronic anxiety   . Complication of anesthesia    hard to wake up  . COPD (chronic obstructive pulmonary disease) ([place])   . Depression   . Esophageal reflux   . Headache(784.0)   . Hyperlipidemia   . Hypertension   . Hypothyroidism   . Obesity   . Osteoporosis   . Overactive bladder   . PVC's (premature ventricular contractions)   . Thyroid disease   . Vitamin D deficiency disease      Allergies  Allergen Reactions  . Celebrex [Celecoxib] Swelling  . Keflet [Cephalexin] Swelling  . Kenalog [Triamcinolone Acetonide]     unknown  . Lisinopril Other (See Comments)    unknown  . Penicillins Hives and Swelling  . Sulfa Antibiotics Swelling     Current Outpatient Medications  Medication Sig Dispense Refill  . acetaminophen (TYLENOL) 650 MG CR tablet Take 650 mg by mouth every 8 (eight) hours as needed for pain.    Marland Kitchen ALPRAZolam (XANAX) 0.5 MG tablet Take 1 tablet (0.5 mg total) by mouth 3 (three) times daily as needed for anxiety. 90 tablet 2  . aspirin 81 MG EC tablet Take 1 tablet (81 mg total) by mouth daily. Swallow whole. 30 tablet 12  . calcium carbonate (OS-CAL - DOSED IN MG OF ELEMENTAL CALCIUM) 1250 (500 Ca) MG tablet Take 1 tablet by mouth.    . cholecalciferol (VITAMIN D) 1000 units tablet Take 1,000 Units  by mouth daily.    . clobetasol cream (TEMOVATE) 3.41 % Apply 1 application topically 2 (two) times daily. 60 g 6  . doxycycline (VIBRA-TABS) 100 MG tablet Take 1 tablet (100 mg total) by mouth 2 (two) times daily. 1 po bid 20 tablet 0  . FLUoxetine (PROZAC) 20 MG capsule TAKE 1 TO 2 CAPSULES DAILY 60 capsule 11  . fluticasone (FLONASE) 50 MCG/ACT nasal spray Place 2 sprays into both nostrils daily. 16 g 11  . furosemide (LASIX) 20 MG tablet Take 1 tablet (20 mg total) by mouth daily. 30 tablet 3  . levothyroxine (SYNTHROID, LEVOTHROID) 50 MCG tablet TAKE ONE (1) TABLET EACH DAY 90 tablet 3  . meclizine (ANTIVERT) 25 MG tablet Take 1 tablet (25 mg total) by mouth 3 (three) times daily as needed for dizziness. 30 tablet 2  . Multiple Vitamin (MULTIVITAMIN) tablet Take 1 tablet by mouth daily.    Marland Kitchen omeprazole-sodium bicarbonate (ZEGERID) 40-1100 MG capsule Take 1 capsule by mouth daily before breakfast.    . oxyCODONE-acetaminophen (PERCOCET/ROXICET) 5-325 MG tablet Take 1 tablet by mouth every 6 (six) hours as needed. 30 tablet 0  . predniSONE (DELTASONE) 10 MG tablet Take 1 tablet (10 mg total) by  mouth daily with breakfast. 30 tablet 2  . saccharomyces boulardii (FLORASTOR) 250 MG capsule Take 250 mg by mouth 2 (two) times daily.    Marland Kitchen telmisartan (MICARDIS) 40 MG tablet Take 1 tablet (40 mg total) by mouth daily. 30 tablet 2  . vitamin C (ASCORBIC ACID) 500 MG tablet Take 500 mg by mouth daily.     No current facility-administered medications for this visit.      Past Surgical History:  Procedure Laterality Date  . ABDOMINAL HYSTERECTOMY    . CHOLECYSTECTOMY N/A 11/04/2013   Procedure: LAPAROSCOPIC CHOLECYSTECTOMY WITH INTRAOPERATIVE CHOLANGIOGRAM;  Surgeon: Imogene Burn. Georgette Dover, MD;  Location: [facility];  Service: General;  Laterality: N/A;  . COLONOSCOPY    . FRACTURE SURGERY     pins removed from Hip surgery  . HIP FRACTURE SURGERY  1990    pins removed in 1991  . PARTIAL HYSTERECTOMY  1987    . SPINAL FUSION    . UPPER GASTROINTESTINAL ENDOSCOPY       Allergies  Allergen Reactions  . Celebrex [Celecoxib] Swelling  . Keflet [Cephalexin] Swelling  . Kenalog [Triamcinolone Acetonide]     unknown  . Lisinopril Other (See Comments)    unknown  . Penicillins Hives and Swelling  . Sulfa Antibiotics Swelling      Family History  Problem Relation Age of Onset  . Arthritis Unknown   . Heart disease Unknown   . Cancer Unknown   . Diabetes Unknown   . OCD Unknown      Social History [name] reports that she has never smoked. She has never used smokeless tobacco. [name] reports that she does not drink alcohol.   Review of Systems CONSTITUTIONAL: No weight loss, fever, chills, weakness or fatigue.  HEENT: Eyes: No visual loss, blurred vision, double vision or yellow sclerae.No hearing loss, sneezing, congestion, runny nose or sore throat.  SKIN: No rash or itching.  CARDIOVASCULAR: per hpi RESPIRATORY: No shortness of breath, cough or sputum.  GASTROINTESTINAL: No anorexia, nausea, vomiting or diarrhea. No abdominal pain or blood.  GENITOURINARY: No burning on urination, no polyuria NEUROLOGICAL: No headache, dizziness, syncope, paralysis, ataxia, numbness or tingling in the extremities. No change in bowel or bladder control.  MUSCULOSKELETAL: No muscle, back pain, joint pain or stiffness.  LYMPHATICS: No enlarged nodes. No history of splenectomy.  PSYCHIATRIC: No history of depression or anxiety.  ENDOCRINOLOGIC: No reports of sweating, cold or heat intolerance. No polyuria or polydipsia.  Marland Kitchen   Physical Examination Vitals:   09/22/17 1345 09/22/17 1354  BP: 130/78 135/81  Pulse: (!) 59 61  SpO2: 98% 99%   Vitals:   09/22/17 1345  Weight: 217 lb 9.6 oz (98.7 kg)  Height: 5\' 4"  (1.626 m)    Gen: resting comfortably, no acute distress HEENT: no scleral icterus, pupils equal round and reactive, no palptable cervical adenopathy,  CV: RRR, 3/6 systolic  murmur rusb, no jvd Resp: Clear to auscultation bilaterally GI: abdomen is soft, non-tender, non-distended, normal bowel sounds, no hepatosplenomegaly MSK: extremities are warm, no edema.  Skin: warm, no rash Neuro:  no focal deficits Psych: appropriate affect   Diagnostic Studies     Assessment and Plan   1. Heart murmur - obtain echo to further evaluate in setting of recent LE edema  2. Leg edema - obtain echo - repeat labs, particularly with recent uptrend in Cr and being lasix  3. HTN - reasonable control, continue current meds  EKG in clinic shows NSR,  no ischemic changes   Arnoldo Lenis, M.D.,

## 2017-09-22 NOTE — Patient Instructions (Addendum)
Medication Instructions:  Your physician recommends that you continue on your current medications as directed. Please refer to the Current Medication list given to you today.  Labwork: BMET, MAG Orders given today   Testing/Procedures: Your physician has requested that you have an echocardiogram. Echocardiography is a painless test that uses sound waves to create images of your heart. It provides your doctor with information about the size and shape of your heart and how well your heart's chambers and valves are working. This procedure takes approximately one hour. There are no restrictions for this procedure.  Follow-Up: Your physician recommends that you schedule a follow-up appointment in: [place] DR. BRAN[facility]   Any Other Special Instructions Will Be Listed Below (If Applicable).  If you need a refill on your cardiac medications before your next appointment, please call your pharmacy.

## 2017-09-27 ENCOUNTER — Encounter: Payer: Self-pay | Admitting: Cardiology

## 2017-09-29 ENCOUNTER — Other Ambulatory Visit: Payer: Self-pay | Admitting: Physician Assistant

## 2017-10-01 ENCOUNTER — Encounter: Payer: Self-pay | Admitting: Physician Assistant

## 2017-10-01 ENCOUNTER — Other Ambulatory Visit: Payer: Self-pay

## 2017-10-01 ENCOUNTER — Ambulatory Visit (INDEPENDENT_AMBULATORY_CARE_PROVIDER_SITE_OTHER): Payer: Medicare Other | Admitting: Physician Assistant

## 2017-10-01 VITALS — BP 117/75 | HR 61 | Temp 97.5°F | Ht 64.0 in | Wt 217.4 lb

## 2017-10-01 DIAGNOSIS — F419 Anxiety disorder, unspecified: Secondary | ICD-10-CM

## 2017-10-01 DIAGNOSIS — I10 Essential (primary) hypertension: Secondary | ICD-10-CM

## 2017-10-01 DIAGNOSIS — F332 Major depressive disorder, recurrent severe without psychotic features: Secondary | ICD-10-CM | POA: Diagnosis not present

## 2017-10-01 DIAGNOSIS — E039 Hypothyroidism, unspecified: Secondary | ICD-10-CM | POA: Diagnosis not present

## 2017-10-01 DIAGNOSIS — E7800 Pure hypercholesterolemia, unspecified: Secondary | ICD-10-CM | POA: Diagnosis not present

## 2017-10-01 MED ORDER — ALPRAZOLAM 0.5 MG PO TABS: 0.5000 mg | ORAL_TABLET | Freq: Three times a day (TID) | ORAL | 5 refills | Status: DC | PRN

## 2017-10-01 MED ORDER — LORATADINE 10 MG PO TABS: 10.0000 mg | ORAL_TABLET | Freq: Every day | ORAL | 11 refills | Status: DC

## 2017-10-01 MED ORDER — TELMISARTAN 40 MG PO TABS: ORAL_TABLET | ORAL | 3 refills | Status: DC

## 2017-10-01 MED ORDER — FLUOXETINE HCL 20 MG PO CAPS: ORAL_CAPSULE | ORAL | 3 refills | Status: DC

## 2017-10-01 NOTE — Progress Notes (Signed)
BP 117/75   Pulse 61   Temp (!) 97.5 F (36.4 C) (Oral)   Ht 5\' 4"  (1.626 m)   Wt 217 lb 6.4 oz (98.6 kg)   BMI 37.32 kg/m    Subjective:    Patient ID: [name], female    DOB: [birth year], 72 y.o.   MRN: [id number]  HPI: [name] is a 72 y.o. female presenting on 10/01/2017 for Hypertension (Needs follow up labs for cardiologist ); Hypothyroidism; and Hyperlipidemia  She reports that she will be going soon for an echocardiogram and will be seeing the cardiologist later in the month.  She reports no difficulties at this time.  A murmur has been heard.  She has a mild amount of edema sometimes in her lower legs.  She does have a diuretic to take.  She was given permission to take to per day for a few days it this flares up very much.  She also does need refills on several of her medications.  They will be sent to the pharmacy.  She states that her cholesterol and hypothyroidism have been pretty good.  He has no other complaints today.  Past Medical History:  Diagnosis Date  . Allergy   . Bronchitis, chronic ([place])   . Chronic anxiety   . Complication of anesthesia    hard to wake up  . COPD (chronic obstructive pulmonary disease) ([place])   . Depression   . Esophageal reflux   . Headache(784.0)   . Hyperlipidemia   . Hypertension   . Hypothyroidism   . Obesity   . Osteoporosis   . Overactive bladder   . PVC's (premature ventricular contractions)   . Thyroid disease   . Vitamin D deficiency disease    Relevant past medical, surgical, family and social history reviewed and updated as indicated. Interim medical history since our last visit reviewed. Allergies and medications reviewed and updated. DATA REVIEWED: CHART IN EPIC  Family History reviewed for pertinent findings.  Review of Systems  Constitutional: Positive for fatigue. Negative for activity change and fever.  HENT: Negative.   Eyes: Negative.   Respiratory: Negative.  Negative for cough.   Cardiovascular:  Negative.  Negative for chest pain.  Gastrointestinal: Negative.  Negative for abdominal pain.  Endocrine: Negative.   Genitourinary: Negative.  Negative for dysuria.  Musculoskeletal: Positive for arthralgias, gait problem, joint swelling and myalgias.  Skin: Negative.   Psychiatric/Behavioral: Positive for decreased concentration. The patient is nervous/anxious.     Allergies as of 10/01/2017      Reactions   Celebrex [celecoxib] Swelling   Keflet [cephalexin] Swelling   Kenalog [triamcinolone Acetonide]    unknown   Lisinopril Other (See Comments)   unknown   Penicillins Hives, Swelling   Sulfa Antibiotics Swelling      Medication List        Accurate as of 10/01/17 12:04 PM. Always use your most recent med list.          acetaminophen 650 MG CR tablet Commonly known as:  TYLENOL Take 650 mg by mouth every 8 (eight) hours as needed for pain.   ALPRAZolam 0.5 MG tablet Commonly known as:  XANAX Take 1 tablet (0.5 mg total) by mouth 3 (three) times daily as needed for anxiety.   aspirin 81 MG EC tablet Take 1 tablet (81 mg total) by mouth daily. Swallow whole.   calcium carbonate 1250 (500 Ca) MG tablet Commonly known as:  OS-CAL - dosed in mg of elemental  calcium Take 1 tablet by mouth.   cholecalciferol 1000 units tablet Commonly known as:  VITAMIN D Take 1,000 Units by mouth daily.   clobetasol cream 0.05 % Commonly known as:  TEMOVATE Apply 1 application topically 2 (two) times daily.   FLUoxetine 20 MG capsule Commonly known as:  PROZAC TAKE 1 TO 2 CAPSULES DAILY   fluticasone 50 MCG/ACT nasal spray Commonly known as:  FLONASE Place 2 sprays into both nostrils daily.   furosemide 20 MG tablet Commonly known as:  LASIX Take 1 tablet (20 mg total) by mouth daily.   levothyroxine 50 MCG tablet Commonly known as:  SYNTHROID, LEVOTHROID TAKE ONE (1) TABLET EACH DAY   meclizine 25 MG tablet Commonly known as:  ANTIVERT Take 1 tablet (25 mg total) by  mouth 3 (three) times daily as needed for dizziness.   multivitamin tablet Take 1 tablet by mouth daily.   omeprazole-sodium bicarbonate 40-1100 MG capsule Commonly known as:  ZEGERID Take 1 capsule by mouth daily before breakfast.   oxyCODONE-acetaminophen 5-325 MG tablet Commonly known as:  PERCOCET/ROXICET Take 1 tablet by mouth every 6 (six) hours as needed.   predniSONE 10 MG tablet Commonly known as:  DELTASONE Take 1 tablet (10 mg total) by mouth daily with breakfast.   saccharomyces boulardii 250 MG capsule Commonly known as:  FLORASTOR Take 250 mg by mouth 2 (two) times daily.   telmisartan 40 MG tablet Commonly known as:  MICARDIS TAKE ONE (1) TABLET EACH DAY   vitamin C 500 MG tablet Commonly known as:  ASCORBIC ACID Take 500 mg by mouth daily.          Objective:    BP 117/75   Pulse 61   Temp (!) 97.5 F (36.4 C) (Oral)   Ht 5\' 4"  (1.626 m)   Wt 217 lb 6.4 oz (98.6 kg)   BMI 37.32 kg/m   Allergies  Allergen Reactions  . Celebrex [Celecoxib] Swelling  . Keflet [Cephalexin] Swelling  . Kenalog [Triamcinolone Acetonide]     unknown  . Lisinopril Other (See Comments)    unknown  . Penicillins Hives and Swelling  . Sulfa Antibiotics Swelling    Wt Readings from Last 3 Encounters:  10/01/17 217 lb 6.4 oz (98.6 kg)  09/22/17 217 lb 9.6 oz (98.7 kg)  08/11/17 222 lb (100.7 kg)    Physical Exam  Constitutional: She is oriented to person, place, and time. She appears well-developed and well-nourished.  HENT:  Head: Normocephalic and atraumatic.  Eyes: Pupils are equal, round, and reactive to light. Conjunctivae and EOM are normal.  Cardiovascular: Normal rate, regular rhythm, normal heart sounds and intact distal pulses.  Pulmonary/Chest: Effort normal and breath sounds normal.  Abdominal: Soft. Bowel sounds are normal.  Neurological: She is alert and oriented to person, place, and time. She has normal reflexes.  Skin: Skin is warm and dry. No  rash noted.  Psychiatric: She has a normal mood and affect. Her behavior is normal. Judgment and thought content normal.        Assessment & Plan:   1. Essential hypertension - telmisartan (MICARDIS) 40 MG tablet; TAKE ONE (1) TABLET EACH DAY  Dispense: 90 tablet; Refill: 3  2. Acquired hypothyroidism  continue levothyroxine  3. Pure hypercholesterolemia *Controlled with diet Recheck in 1 year.  4. Severe episode of recurrent major depressive disorder, without psychotic features ([place]) - FLUoxetine (PROZAC) 20 MG capsule; TAKE 1 TO 2 CAPSULES DAILY  Dispense: 180 capsule; Refill: 3  5. Anxiety - ALPRAZolam (XANAX) 0.5 MG tablet; Take 1 tablet (0.5 mg total) by mouth 3 (three) times daily as needed for anxiety.  Dispense: 90 tablet; Refill: 5   Continue all other maintenance medications as listed above.  Follow up plan: No follow-ups on file.  Educational handout given for [place] PA-C [facility] [address]  [place], [place] [address] [phone]   10/01/2017, 12:04 PM

## 2017-10-01 NOTE — Addendum Note (Signed)
Addended by: Earlene Plater on: 10/01/2017 03:21 PM   Modules accepted: Orders

## 2017-10-02 ENCOUNTER — Telehealth: Payer: Self-pay | Admitting: *Deleted

## 2017-10-02 LAB — BASIC METABOLIC PANEL
BUN / CREAT RATIO: 23 (ref 12–28)
BUN: 25 mg/dL (ref 8–27)
CO2: 25 mmol/L (ref 20–29)
CREATININE: 1.11 mg/dL — AB (ref 0.57–1.00)
Calcium: 9.6 mg/dL (ref 8.7–10.3)
Chloride: 98 mmol/L (ref 96–106)
GFR calc Af Amer: 58 mL/min/{1.73_m2} — ABNORMAL LOW (ref >59)
GFR calc non Af Amer: 50 mL/min/{1.73_m2} — ABNORMAL LOW (ref >59)
Glucose: 93 mg/dL (ref 65–99)
Potassium: 4.4 mmol/L (ref 3.5–5.2)
SODIUM: 138 mmol/L (ref 134–144)

## 2017-10-02 NOTE — Telephone Encounter (Signed)
-----   Message from Arnoldo Lenis, MD sent at 10/02/2017 11:27 AM EDT ----- Labs look good. Kidney function much improved from last check, though technically still mildly decreased. No changes   Zandra Abts MD

## 2017-10-02 NOTE — Telephone Encounter (Signed)
Pt aware and voiced understanding - routed to pcp  

## 2017-10-14 ENCOUNTER — Telehealth: Payer: Self-pay | Admitting: Physician Assistant

## 2017-10-14 NOTE — Telephone Encounter (Signed)
Patient aware that for some reason the magnesium was not drawn. Patient will come back and have her magnesium redrawn.

## 2017-10-16 ENCOUNTER — Other Ambulatory Visit: Payer: Medicare Other

## 2017-10-16 DIAGNOSIS — I10 Essential (primary) hypertension: Secondary | ICD-10-CM | POA: Diagnosis not present

## 2017-10-17 ENCOUNTER — Telehealth: Payer: Self-pay | Admitting: *Deleted

## 2017-10-17 ENCOUNTER — Telehealth: Payer: Self-pay | Admitting: Physician Assistant

## 2017-10-17 LAB — MAGNESIUM: Magnesium: 2.1 mg/dL (ref 1.6–2.3)

## 2017-10-17 NOTE — Telephone Encounter (Signed)
Patient informed. 

## 2017-10-17 NOTE — Telephone Encounter (Signed)
-----   Message from Charlie Pitter, [place] sent at 10/17/2017  8:12 AM EDT ----- Magnesium is normal. Melina Copa PA-C

## 2017-10-17 NOTE — Telephone Encounter (Signed)
Pt called - aware that [name] is off for a few days . She has cardio appt on next Thursday. She is aware that our most recent potassium level was normal as well as her mag level - normal.  Would you advise a future BMP or potassium?

## 2017-10-21 NOTE — Telephone Encounter (Signed)
She states that her mouth is very dry, she is very weak  - appt made

## 2017-10-21 NOTE — Telephone Encounter (Signed)
Yes we can go back to her regular schedule of every 3 to 6 months on her BMP.

## 2017-10-22 ENCOUNTER — Other Ambulatory Visit: Payer: Self-pay

## 2017-10-22 ENCOUNTER — Ambulatory Visit (INDEPENDENT_AMBULATORY_CARE_PROVIDER_SITE_OTHER): Payer: Medicare Other

## 2017-10-22 ENCOUNTER — Telehealth: Payer: Self-pay | Admitting: Cardiology

## 2017-10-22 DIAGNOSIS — R011 Cardiac murmur, unspecified: Secondary | ICD-10-CM | POA: Diagnosis not present

## 2017-10-22 NOTE — Telephone Encounter (Signed)
Dr. Harl Bowie did not start patient on this medication. Per chart looks like this was started back in march. Patient did not recall being on medication that long and states she thinks this is coming from something more recent. Advised patient we have not started her on any other fluid medications. Patient verbalized understanding and states she will contact her PCP.

## 2017-10-22 NOTE — Telephone Encounter (Signed)
Patient walk/in here to have test done  Patient weak since taking fluid medication.

## 2017-10-24 ENCOUNTER — Encounter: Payer: Self-pay | Admitting: Cardiology

## 2017-10-24 ENCOUNTER — Ambulatory Visit (INDEPENDENT_AMBULATORY_CARE_PROVIDER_SITE_OTHER): Payer: Medicare Other | Admitting: Cardiology

## 2017-10-24 ENCOUNTER — Other Ambulatory Visit: Payer: Self-pay

## 2017-10-24 VITALS — BP 141/79 | HR 57 | Ht 64.0 in | Wt 220.0 lb

## 2017-10-24 DIAGNOSIS — R600 Localized edema: Secondary | ICD-10-CM

## 2017-10-24 NOTE — Progress Notes (Signed)
Clinical Summary Ms. Halk is a 72 y.o.female seen today for follow up of the following medical problems. This is a focused visit for ongoing issues with LE edema.    1. Leg edema - recent leg edema and weight loss. Started on lasix by pcp -11/2015 LE venous US [place]: no DVT  - reports several year history, worst recently over the last month. On lasix x 3 weeks.  - somewhat imrpoved with lasix, cutting on sodium.  - no recent SOB/DOE. No orthopnea.   09/2017 echo LVEF 60-65%, no WMAs, Diastolic function is abnormal, indeterminant grade - swelling is improving.  - weak spells. Orthostatic symptoms with standing - not interested in compression stockings    2. AKI - uptrend in Cr to 1.6 from lab review back in 07/2017.  - had been on mobic at the time she reports  09/2017 Cr down to 1.11  Past Medical History:  Diagnosis Date  . Allergy   . Bronchitis, chronic ([place])   . Chronic anxiety   . Complication of anesthesia    hard to wake up  . COPD (chronic obstructive pulmonary disease) ([place])   . Depression   . Esophageal reflux   . Headache(784.0)   . Hyperlipidemia   . Hypertension   . Hypothyroidism   . Obesity   . Osteoporosis   . Overactive bladder   . PVC's (premature ventricular contractions)   . Thyroid disease   . Vitamin D deficiency disease      Allergies  Allergen Reactions  . Celebrex [Celecoxib] Swelling  . Keflet [Cephalexin] Swelling  . Kenalog [Triamcinolone Acetonide]     unknown  . Lisinopril Other (See Comments)    unknown  . Penicillins Hives and Swelling  . Sulfa Antibiotics Swelling     Current Outpatient Medications  Medication Sig Dispense Refill  . acetaminophen (TYLENOL) 650 MG CR tablet Take 650 mg by mouth every 8 (eight) hours as needed for pain.    Marland Kitchen ALPRAZolam (XANAX) 0.5 MG tablet Take 1 tablet (0.5 mg total) by mouth 3 (three) times daily as needed for anxiety. 90 tablet 5  . aspirin 81 MG EC tablet Take 1 tablet (81  mg total) by mouth daily. Swallow whole. 30 tablet 12  . calcium carbonate (OS-CAL - DOSED IN MG OF ELEMENTAL CALCIUM) 1250 (500 Ca) MG tablet Take 1 tablet by mouth.    . cholecalciferol (VITAMIN D) 1000 units tablet Take 1,000 Units by mouth daily.    . clobetasol cream (TEMOVATE) 5.91 % Apply 1 application topically 2 (two) times daily. 60 g 6  . FLUoxetine (PROZAC) 20 MG capsule TAKE 1 TO 2 CAPSULES DAILY 180 capsule 3  . fluticasone (FLONASE) 50 MCG/ACT nasal spray Place 2 sprays into both nostrils daily. 16 g 11  . furosemide (LASIX) 20 MG tablet Take 1 tablet (20 mg total) by mouth daily. 30 tablet 3  . levothyroxine (SYNTHROID, LEVOTHROID) 50 MCG tablet TAKE ONE (1) TABLET EACH DAY 90 tablet 3  . loratadine (CLARITIN) 10 MG tablet Take 1 tablet (10 mg total) by mouth daily. 30 tablet 11  . meclizine (ANTIVERT) 25 MG tablet Take 1 tablet (25 mg total) by mouth 3 (three) times daily as needed for dizziness. 30 tablet 2  . Multiple Vitamin (MULTIVITAMIN) tablet Take 1 tablet by mouth daily.    Marland Kitchen omeprazole-sodium bicarbonate (ZEGERID) 40-1100 MG capsule Take 1 capsule by mouth daily before breakfast.    . oxyCODONE-acetaminophen (PERCOCET/ROXICET) 5-325 MG tablet  Take 1 tablet by mouth every 6 (six) hours as needed. 30 tablet 0  . predniSONE (DELTASONE) 10 MG tablet Take 1 tablet (10 mg total) by mouth daily with breakfast. 30 tablet 2  . saccharomyces boulardii (FLORASTOR) 250 MG capsule Take 250 mg by mouth 2 (two) times daily.    Marland Kitchen telmisartan (MICARDIS) 40 MG tablet TAKE ONE (1) TABLET EACH DAY 90 tablet 3  . vitamin C (ASCORBIC ACID) 500 MG tablet Take 500 mg by mouth daily.     No current facility-administered medications for this visit.      Past Surgical History:  Procedure Laterality Date  . ABDOMINAL HYSTERECTOMY    . CHOLECYSTECTOMY N/A 11/04/2013   Procedure: LAPAROSCOPIC CHOLECYSTECTOMY WITH INTRAOPERATIVE CHOLANGIOGRAM;  Surgeon: Imogene Burn. Georgette Dover, MD;  Location: [facility];   Service: General;  Laterality: N/A;  . COLONOSCOPY    . FRACTURE SURGERY     pins removed from Hip surgery  . HIP FRACTURE SURGERY  1990    pins removed in 1991  . PARTIAL HYSTERECTOMY  1987  . SPINAL FUSION    . UPPER GASTROINTESTINAL ENDOSCOPY       Allergies  Allergen Reactions  . Celebrex [Celecoxib] Swelling  . Keflet [Cephalexin] Swelling  . Kenalog [Triamcinolone Acetonide]     unknown  . Lisinopril Other (See Comments)    unknown  . Penicillins Hives and Swelling  . Sulfa Antibiotics Swelling      Family History  Problem Relation Age of Onset  . Arthritis Unknown   . Heart disease Unknown   . Cancer Unknown   . Diabetes Unknown   . OCD Unknown      Social History Ms. Shiley reports that she has never smoked. She has never used smokeless tobacco. Ms. Borden reports that she does not drink alcohol.   Review of Systems CONSTITUTIONAL: occasional fatigue.  HEENT: Eyes: No visual loss, blurred vision, double vision or yellow sclerae.No hearing loss, sneezing, congestion, runny nose or sore throat.  SKIN: No rash or itching.  CARDIOVASCULAR: per hpi RESPIRATORY: No shortness of breath, cough or sputum.  GASTROINTESTINAL: No anorexia, nausea, vomiting or diarrhea. No abdominal pain or blood.  GENITOURINARY: No burning on urination, no polyuria NEUROLOGICAL: No headache, dizziness, syncope, paralysis, ataxia, numbness or tingling in the extremities. No change in bowel or bladder control.  MUSCULOSKELETAL: No muscle, back pain, joint pain or stiffness.  LYMPHATICS: No enlarged nodes. No history of splenectomy.  PSYCHIATRIC: No history of depression or anxiety.  ENDOCRINOLOGIC: No reports of sweating, cold or heat intolerance. No polyuria or polydipsia.  Marland Kitchen   Physical Examination Vitals:   10/24/17 1344  BP: (!) 141/79  Pulse: (!) 57  SpO2: 99%   Vitals:   10/24/17 1344  Weight: 220 lb (99.8 kg)  Height: 5\' 4"  (1.626 m)    Gen: resting comfortably, no  acute distress HEENT: no scleral icterus, pupils equal round and reactive, no palptable cervical adenopathy,  CV: RRR, 2/6 systolic murmur rusb, no jvd Resp: Clear to auscultation bilaterally GI: abdomen is soft, non-tender, non-distended, normal bowel sounds, no hepatosplenomegaly MSK: extremities are warm, no edema.  Skin: warm, no rash Neuro:  no focal deficits Psych: appropriate affect   Diagnostic Studies 09/2017 echo Study Conclusions  - Left ventricle: The cavity size was normal. Wall thickness was   increased in a pattern of mild LVH. Systolic function was normal.   The estimated ejection fraction was in the range of 60% to 65%.   Wall  motion was normal; there were no regional wall motion   abnormalities. - Aortic valve: Mildly calcified annulus. Trileaflet; mildly   thickened leaflets. Mean gradient (S): 8 mm Hg. Valve area (VTI):   2.13 cm^2. Valve area (Vmax): 1.86 cm^2. Valve area (Vmean): 2.05   cm^2. - Left atrium: The atrium was severely dilated. - Right atrium: The atrium was mildly dilated. - Technically adequate study.    Assessment and Plan  1. Leg edema - improved with diuretics, suspect likely also a component of venous insufficiency - some orthostatic symptoms, instructed ok to take her lasix just prn       Arnoldo Lenis, M.D.

## 2017-10-24 NOTE — Patient Instructions (Signed)
Your physician wants you to follow-up in: 6 MONTHS WITH DR BRANCH   Your physician recommends that you continue on your current medications as directed. Please refer to the Current Medication list given to you today.  Thank you for choosing [facility]!!   

## 2017-10-30 ENCOUNTER — Other Ambulatory Visit: Payer: Self-pay | Admitting: Physician Assistant

## 2017-10-30 DIAGNOSIS — R609 Edema, unspecified: Secondary | ICD-10-CM

## 2017-10-31 ENCOUNTER — Encounter: Payer: Self-pay | Admitting: Cardiology

## 2017-11-10 DIAGNOSIS — M4716 Other spondylosis with myelopathy, lumbar region: Secondary | ICD-10-CM | POA: Diagnosis not present

## 2017-11-13 ENCOUNTER — Other Ambulatory Visit: Payer: Self-pay | Admitting: Nurse Practitioner

## 2017-11-13 DIAGNOSIS — M4716 Other spondylosis with myelopathy, lumbar region: Secondary | ICD-10-CM

## 2017-12-04 DIAGNOSIS — I700 Atherosclerosis of aorta: Secondary | ICD-10-CM | POA: Diagnosis not present

## 2017-12-04 DIAGNOSIS — M47814 Spondylosis without myelopathy or radiculopathy, thoracic region: Secondary | ICD-10-CM | POA: Diagnosis not present

## 2017-12-04 DIAGNOSIS — M5126 Other intervertebral disc displacement, lumbar region: Secondary | ICD-10-CM | POA: Diagnosis not present

## 2017-12-04 DIAGNOSIS — M48061 Spinal stenosis, lumbar region without neurogenic claudication: Secondary | ICD-10-CM | POA: Diagnosis not present

## 2017-12-04 DIAGNOSIS — M4716 Other spondylosis with myelopathy, lumbar region: Secondary | ICD-10-CM | POA: Diagnosis not present

## 2017-12-04 DIAGNOSIS — Z981 Arthrodesis status: Secondary | ICD-10-CM | POA: Diagnosis not present

## 2017-12-04 DIAGNOSIS — M4804 Spinal stenosis, thoracic region: Secondary | ICD-10-CM | POA: Diagnosis not present

## 2017-12-04 DIAGNOSIS — K449 Diaphragmatic hernia without obstruction or gangrene: Secondary | ICD-10-CM | POA: Diagnosis not present

## 2017-12-10 DIAGNOSIS — M170 Bilateral primary osteoarthritis of knee: Secondary | ICD-10-CM | POA: Diagnosis not present

## 2017-12-16 DIAGNOSIS — M4716 Other spondylosis with myelopathy, lumbar region: Secondary | ICD-10-CM | POA: Diagnosis not present

## 2017-12-18 ENCOUNTER — Other Ambulatory Visit: Payer: Self-pay | Admitting: Nurse Practitioner

## 2017-12-18 DIAGNOSIS — M4716 Other spondylosis with myelopathy, lumbar region: Secondary | ICD-10-CM

## 2017-12-30 ENCOUNTER — Other Ambulatory Visit: Payer: Medicare Other

## 2018-01-02 ENCOUNTER — Ambulatory Visit
Admission: RE | Admit: 2018-01-02 | Discharge: 2018-01-02 | Disposition: A | Discharge status: Home / Self Care | Payer: Medicare Other | Source: Ambulatory Visit | Attending: Nurse Practitioner | Admitting: Nurse Practitioner

## 2018-01-02 ENCOUNTER — Other Ambulatory Visit: Payer: Self-pay | Admitting: Nurse Practitioner

## 2018-01-02 DIAGNOSIS — M4716 Other spondylosis with myelopathy, lumbar region: Secondary | ICD-10-CM

## 2018-01-02 MED ORDER — IOPAMIDOL (ISOVUE-M 200) INJECTION 41%
1.0000 mL | Freq: Once | INTRAMUSCULAR | Status: AC
  Administered 2018-01-02: 1 mL via INTRA_ARTICULAR

## 2018-01-02 MED ORDER — METHYLPREDNISOLONE ACETATE 40 MG/ML INJ SUSP (RADIOLOG
120.0000 mg | Freq: Once | INTRAMUSCULAR | Status: AC
  Administered 2018-01-02: 120 mg via INTRA_ARTICULAR

## 2018-01-02 NOTE — Discharge Instructions (Signed)

## 2018-01-23 DIAGNOSIS — M4716 Other spondylosis with myelopathy, lumbar region: Secondary | ICD-10-CM | POA: Diagnosis not present

## 2018-01-28 ENCOUNTER — Other Ambulatory Visit: Payer: Self-pay | Admitting: Physician Assistant

## 2018-01-28 DIAGNOSIS — R609 Edema, unspecified: Secondary | ICD-10-CM

## 2018-03-17 DIAGNOSIS — M170 Bilateral primary osteoarthritis of knee: Secondary | ICD-10-CM | POA: Diagnosis not present

## 2018-04-09 ENCOUNTER — Other Ambulatory Visit: Payer: Self-pay | Admitting: Physician Assistant

## 2018-04-09 DIAGNOSIS — F419 Anxiety disorder, unspecified: Secondary | ICD-10-CM

## 2018-04-16 DIAGNOSIS — M170 Bilateral primary osteoarthritis of knee: Secondary | ICD-10-CM | POA: Diagnosis not present

## 2018-05-19 ENCOUNTER — Other Ambulatory Visit: Payer: Self-pay

## 2018-05-19 ENCOUNTER — Emergency Department (HOSPITAL_COMMUNITY)
Admission: EM | Admit: 2018-05-19 | Discharge: 2018-05-19 | Disposition: A | Discharge status: Home / Self Care | Payer: Medicare Other | Attending: Emergency Medicine | Admitting: Emergency Medicine

## 2018-05-19 ENCOUNTER — Encounter (HOSPITAL_COMMUNITY): Payer: Self-pay | Admitting: Emergency Medicine

## 2018-05-19 DIAGNOSIS — R600 Localized edema: Secondary | ICD-10-CM | POA: Diagnosis not present

## 2018-05-19 DIAGNOSIS — Z79899 Other long term (current) drug therapy: Secondary | ICD-10-CM | POA: Diagnosis not present

## 2018-05-19 DIAGNOSIS — E876 Hypokalemia: Secondary | ICD-10-CM | POA: Diagnosis not present

## 2018-05-19 DIAGNOSIS — J449 Chronic obstructive pulmonary disease, unspecified: Secondary | ICD-10-CM | POA: Diagnosis not present

## 2018-05-19 DIAGNOSIS — E039 Hypothyroidism, unspecified: Secondary | ICD-10-CM | POA: Insufficient documentation

## 2018-05-19 DIAGNOSIS — Z7982 Long term (current) use of aspirin: Secondary | ICD-10-CM | POA: Diagnosis not present

## 2018-05-19 DIAGNOSIS — E860 Dehydration: Secondary | ICD-10-CM | POA: Diagnosis not present

## 2018-05-19 DIAGNOSIS — I10 Essential (primary) hypertension: Secondary | ICD-10-CM | POA: Diagnosis not present

## 2018-05-19 DIAGNOSIS — R2243 Localized swelling, mass and lump, lower limb, bilateral: Secondary | ICD-10-CM | POA: Diagnosis present

## 2018-05-19 LAB — BASIC METABOLIC PANEL
ANION GAP: 11 (ref 5–15)
BUN: 17 mg/dL (ref 8–23)
CALCIUM: 9.3 mg/dL (ref 8.9–10.3)
CO2: 28 mmol/L (ref 22–32)
CREATININE: 1.04 mg/dL — AB (ref 0.44–1.00)
Chloride: 99 mmol/L (ref 98–111)
GFR, EST NON AFRICAN AMERICAN: 54 mL/min — AB (ref >60)
Glucose, Bld: 109 mg/dL — ABNORMAL HIGH (ref 70–99)
Potassium: 2.5 mmol/L — CL (ref 3.5–5.1)
Sodium: 138 mmol/L (ref 135–145)

## 2018-05-19 LAB — HEPATIC FUNCTION PANEL
ALT: 17 U/L (ref 0–44)
AST: 26 U/L (ref 15–41)
Albumin: 4.2 g/dL (ref 3.5–5.0)
Alkaline Phosphatase: 95 U/L (ref 38–126)
Bilirubin, Direct: 0.2 mg/dL (ref 0.0–0.2)
Indirect Bilirubin: 0.6 mg/dL (ref 0.3–0.9)
Total Bilirubin: 0.8 mg/dL (ref 0.3–1.2)
Total Protein: 7.5 g/dL (ref 6.5–8.1)

## 2018-05-19 LAB — CBC
HEMATOCRIT: 37.9 % (ref 36.0–46.0)
Hemoglobin: 12 g/dL (ref 12.0–15.0)
MCH: 28.6 pg (ref 26.0–34.0)
MCHC: 31.7 g/dL (ref 30.0–36.0)
MCV: 90.5 fL (ref 80.0–100.0)
PLATELETS: 412 10*3/uL — AB (ref 150–400)
RBC: 4.19 MIL/uL (ref 3.87–5.11)
RDW: 13.1 % (ref 11.5–15.5)
WBC: 8.5 10*3/uL (ref 4.0–10.5)
nRBC: 0 % (ref 0.0–0.2)

## 2018-05-19 LAB — BRAIN NATRIURETIC PEPTIDE: B NATRIURETIC PEPTIDE 5: 161 pg/mL — AB (ref 0.0–100.0)

## 2018-05-19 MED ORDER — POTASSIUM CHLORIDE CRYS ER 20 MEQ PO TBCR: 20.0000 meq | EXTENDED_RELEASE_TABLET | Freq: Every day | ORAL | 0 refills | Status: DC

## 2018-05-19 MED ORDER — POTASSIUM CHLORIDE 10 MEQ/100ML IV SOLN
10.0000 meq | Freq: Once | INTRAVENOUS | Status: AC
  Administered 2018-05-19: 10 meq via INTRAVENOUS
  Filled 2018-05-19: qty 100

## 2018-05-19 MED ORDER — FUROSEMIDE 10 MG/ML IJ SOLN
40.0000 mg | Freq: Once | INTRAMUSCULAR | Status: AC
  Administered 2018-05-19: 40 mg via INTRAVENOUS
  Filled 2018-05-19: qty 4

## 2018-05-19 MED ORDER — HYDROCODONE-ACETAMINOPHEN 5-325 MG PO TABS
1.0000 | ORAL_TABLET | Freq: Once | ORAL | Status: AC
  Administered 2018-05-19: 1 via ORAL
  Filled 2018-05-19: qty 1

## 2018-05-19 MED ORDER — POTASSIUM CHLORIDE CRYS ER 20 MEQ PO TBCR
40.0000 meq | EXTENDED_RELEASE_TABLET | Freq: Once | ORAL | Status: AC
  Administered 2018-05-19: 40 meq via ORAL
  Filled 2018-05-19: qty 2

## 2018-05-19 NOTE — ED Triage Notes (Signed)
Pt state "my legs have been swelling for over a year"  Pt c/o of her left leg weeping fluid since yesterday

## 2018-05-19 NOTE — ED Notes (Signed)
Patient clothing removed.  Purewick in place.

## 2018-05-19 NOTE — Discharge Instructions (Addendum)
Increase your Lasix so you are taking 60 mg a day.  Follow-up with your cardiologist in 2 to 3 weeks.  If you cannot get into see a cardiologist then just follow-up with your family doctor for right now

## 2018-05-19 NOTE — ED Notes (Signed)
Date and time results received: 05/19/18 1532 (use smartphrase ".now" to insert current time)  Test: K+ Critical Value: 2.5  Name of Provider Notified: Dr. Lita Mains  Orders Received? Or Actions Taken?:  Patient to be roomed

## 2018-05-19 NOTE — ED Provider Notes (Addendum)
[facility] EMERGENCY DEPARTMENT Provider Note   CSN: [id number] Arrival date & time: 05/19/18  1254     History   Chief Complaint Chief Complaint  Patient presents with  . Leg Swelling    HPI [name] is a 72 y.o. female.  Patient complains of swelling in her legs.  The history is provided by the patient. No language interpreter was used.  Illness  This is a chronic problem. The current episode started more than 1 week ago. The problem occurs constantly. The problem has not changed since onset.Pertinent negatives include no chest pain, no abdominal pain and no headaches. Nothing aggravates the symptoms. Nothing relieves the symptoms. Treatments tried: Lasix. The treatment provided no relief.    Past Medical History:  Diagnosis Date  . Allergy   . Bronchitis, chronic ([place])   . Chronic anxiety   . Complication of anesthesia    hard to wake up  . COPD (chronic obstructive pulmonary disease) ([place])   . Depression   . Esophageal reflux   . Headache(784.0)   . Hyperlipidemia   . Hypertension   . Hypothyroidism   . Obesity   . Osteoporosis   . Overactive bladder   . PVC's (premature ventricular contractions)   . Thyroid disease   . Vitamin D deficiency disease     Patient Active Problem List   Diagnosis Date Noted  . Anemia 08/12/2017  . Vitamin D deficiency 08/12/2017  . Lumbar spondylosis with myelopathy 06/03/2017  . Primary osteoarthritis of both knees 03/19/2017  . Anxiety 02/17/2017  . Acute non-recurrent frontal sinusitis 02/17/2017  . History of pulmonary embolus (PE) 07/09/2016  . COPD (chronic obstructive pulmonary disease) ([place]) 01/18/2016  . Hyperlipidemia 01/18/2016  . Depression 01/18/2016  . Essential hypertension 01/18/2016  . H/O spinal fusion 01/18/2016  . Thyroid activity decreased 01/18/2016  . Thoracic aorta atherosclerosis ([place]) 01/18/2016  . Chronic cholecystitis with calculus 10/04/2013    Past Surgical History:  Procedure  Laterality Date  . ABDOMINAL HYSTERECTOMY    . CHOLECYSTECTOMY N/A 11/04/2013   Procedure: LAPAROSCOPIC CHOLECYSTECTOMY WITH INTRAOPERATIVE CHOLANGIOGRAM;  Surgeon: Imogene Burn. Georgette Dover, MD;  Location: [place];  Service: General;  Laterality: N/A;  . COLONOSCOPY    . FRACTURE SURGERY     pins removed from Hip surgery  . HIP FRACTURE SURGERY  1990    pins removed in 1991  . PARTIAL HYSTERECTOMY  1987  . SPINAL FUSION    . UPPER GASTROINTESTINAL ENDOSCOPY       OB History   No obstetric history on file.      Home Medications    Prior to Admission medications   Medication Sig Start Date End Date Taking? Authorizing Provider  acetaminophen (TYLENOL) 650 MG CR tablet Take 650 mg by mouth every 8 (eight) hours as needed for pain.   Yes [provider]  ALPRAZolam (XANAX) 0.5 MG tablet Take 1 tablet (0.5 mg total) by mouth 3 (three) times daily as needed for anxiety. 10/01/17  Yes Terald Sleeper, PA-C  aspirin 81 MG EC tablet Take 1 tablet (81 mg total) by mouth daily. Swallow whole. 07/09/16  Yes Terald Sleeper, PA-C  calcium carbonate (OS-CAL - DOSED IN MG OF ELEMENTAL CALCIUM) 1250 (500 Ca) MG tablet Take 1 tablet by mouth once a week.    Yes [provider]  cholecalciferol (VITAMIN D) 1000 units tablet Take 1,000 Units by mouth daily.   Yes [provider]  FLUoxetine (PROZAC) 20 MG capsule TAKE  1 TO 2 CAPSULES DAILY Patient taking differently: Take 40 mg by mouth daily.  10/01/17  Yes Terald Sleeper, PA-C  furosemide (LASIX) 20 MG tablet TAKE ONE (1) TABLET EACH DAY Patient taking differently: Take 40 mg by mouth daily.  01/29/18  Yes Terald Sleeper, PA-C  levothyroxine (SYNTHROID, LEVOTHROID) 50 MCG tablet TAKE ONE (1) TABLET EACH DAY Patient taking differently: Take 50 mcg by mouth daily before breakfast.  08/11/17  Yes Terald Sleeper, PA-C  Multiple Vitamin (MULTIVITAMIN) tablet Take 1 tablet by mouth 2 (two) times a week.    Yes [provider]    omeprazole-sodium bicarbonate (ZEGERID) 40-1100 MG capsule Take 1 capsule by mouth daily before breakfast. Patient taking differently: Take 1 capsule by mouth daily as needed (for indogestion).  02/23/16  Yes Terald Sleeper, PA-C  oxyCODONE-acetaminophen (PERCOCET/ROXICET) 5-325 MG tablet Take 1 tablet by mouth every 6 (six) hours as needed. Patient taking differently: Take 1 tablet by mouth every 6 (six) hours as needed for moderate pain or severe pain.  01/18/16  Yes Eustaquio Maize, MD  telmisartan (MICARDIS) 40 MG tablet TAKE ONE (1) TABLET EACH DAY Patient taking differently: Take 40 mg by mouth daily.  10/01/17  Yes Terald Sleeper, PA-C  potassium chloride SA (K-DUR,KLOR-CON) 20 MEQ tablet Take 1 tablet (20 mEq total) by mouth daily. 05/19/18   Milton Ferguson, MD    Family History Family History  Problem Relation Age of Onset  . Arthritis Other   . Heart disease Other   . Cancer Other   . Diabetes Other   . OCD Other     Social History Social History   Tobacco Use  . Smoking status: Never Smoker  . Smokeless tobacco: Never Used  Substance Use Topics  . Alcohol use: No  . Drug use: No     Allergies   Celebrex [celecoxib]; Keflet [cephalexin]; Penicillins; Sulfa antibiotics; Kenalog [triamcinolone acetonide]; and Lisinopril   Review of Systems Review of Systems  Constitutional: Negative for appetite change and fatigue.  HENT: Negative for congestion, ear discharge and sinus pressure.   Eyes: Negative for discharge.  Respiratory: Negative for cough.   Cardiovascular: Negative for chest pain.  Gastrointestinal: Negative for abdominal pain and diarrhea.  Genitourinary: Negative for frequency and hematuria.  Musculoskeletal: Negative for back pain.       Swelling in legs  Skin: Negative for rash.  Neurological: Negative for seizures and headaches.  Psychiatric/Behavioral: Negative for hallucinations.     Physical Exam Updated Vital Signs BP 132/80   Pulse 73    Temp 97.9 F (36.6 C) (Oral)   Resp 12   Ht 5\' 4"  (1.626 m)   Wt 95.3 kg   SpO2 97%   BMI 36.05 kg/m   Physical Exam Vitals signs reviewed.  Constitutional:      Appearance: She is well-developed.  HENT:     Head: Normocephalic.     Nose: Nose normal.     Mouth/Throat:     Mouth: Mucous membranes are moist.  Eyes:     General: No scleral icterus.    Conjunctiva/sclera: Conjunctivae normal.  Neck:     Musculoskeletal: Neck supple.     Thyroid: No thyromegaly.  Cardiovascular:     Rate and Rhythm: Normal rate and regular rhythm.     Heart sounds: No murmur. No friction rub. No gallop.   Pulmonary:     Breath sounds: No stridor. No wheezing or rales.  Chest:  Chest wall: No tenderness.  Abdominal:     General: There is no distension.     Tenderness: There is no abdominal tenderness. There is no rebound.  Musculoskeletal: Normal range of motion.     Comments: 2+ edema all the way up to her knees bilateral  Lymphadenopathy:     Cervical: No cervical adenopathy.  Skin:    Findings: No erythema or rash.  Neurological:     Mental Status: She is oriented to person, place, and time.     Motor: No abnormal muscle tone.     Coordination: Coordination normal.  Psychiatric:        Behavior: Behavior normal.      ED Treatments / Results  Labs (all labs ordered are listed, but only abnormal results are displayed) Labs Reviewed  BRAIN NATRIURETIC PEPTIDE - Abnormal; Notable for the following components:      Result Value   B Natriuretic Peptide 161.0 (*)    All other components within normal limits  CBC - Abnormal; Notable for the following components:   Platelets 412 (*)    All other components within normal limits  BASIC METABOLIC PANEL - Abnormal; Notable for the following components:   Potassium 2.5 (*)    Glucose, Bld 109 (*)    Creatinine, Ser 1.04 (*)    GFR calc non Af Amer 54 (*)    All other components within normal limits  HEPATIC FUNCTION PANEL     EKG EKG Interpretation  Date/Time:  Tuesday May 19 2018 16:12:30 EST Ventricular Rate:  73 PR Interval:    QRS Duration: 91 QT Interval:  390 QTC Calculation: 430 R Axis:   71 Text Interpretation:  Sinus rhythm Inferior infarct, age indeterminate Confirmed by Milton Ferguson [phone] on 05/19/2018 7:57:03 PM   Radiology No results found.  Procedures Procedures (including critical care time)  Medications Ordered in ED Medications  furosemide (LASIX) injection 40 mg (40 mg Intravenous Given 05/19/18 1704)  potassium chloride SA (K-DUR,KLOR-CON) CR tablet 40 mEq (40 mEq Oral Given 05/19/18 1704)  potassium chloride 10 mEq in 100 mL IVPB (0 mEq Intravenous Stopped 05/19/18 1836)  potassium chloride 10 mEq in 100 mL IVPB (0 mEq Intravenous Stopped 05/19/18 2000)  HYDROcodone-acetaminophen (NORCO/VICODIN) 5-325 MG per tablet 1 tablet (1 tablet Oral Given 05/19/18 1841)     Initial Impression / Assessment and Plan / ED Course  I have reviewed the triage vital signs and the nursing notes.  Pertinent labs & imaging results that were available during my care of the patient were reviewed by me and considered in my medical decision making (see chart for details). CRITICAL CARE Performed by: Milton Ferguson Total critical care time: 35  minutes Critical care time was exclusive of separately billable procedures and treating other patients. Critical care was necessary to treat or prevent imminent or life-threatening deterioration. Critical care was time spent personally by me on the following activities: development of treatment plan with patient and/or surrogate as well as nursing, discussions with consultants, evaluation of patient's response to treatment, examination of patient, obtaining history from patient or surrogate, ordering and performing treatments and interventions, ordering and review of laboratory studies, ordering and review of radiographic studies, pulse oximetry and  re-evaluation of patient's condition.     With hypokalemia.  She was given 60 mEq of potassium.  And she will be started on some potassium.  She is also given Lasix IV to help with her peripheral edema.  She will follow-up with cardiology.  Patient will increase her Lasix from 40 a day to 60 a day  Final Clinical Impressions(s) / ED Diagnoses   Final diagnoses:  Hypokalemia    ED Discharge Orders         Ordered    potassium chloride SA (K-DUR,KLOR-CON) 20 MEQ tablet  Daily     05/19/18 Jenita Seashore, MD 05/19/18 2010    Milton Ferguson, MD 06/10/18 1156

## 2018-05-22 ENCOUNTER — Ambulatory Visit (INDEPENDENT_AMBULATORY_CARE_PROVIDER_SITE_OTHER): Payer: Medicare Other | Admitting: Cardiology

## 2018-05-22 ENCOUNTER — Encounter: Payer: Self-pay | Admitting: Cardiology

## 2018-05-22 VITALS — BP 137/74 | HR 70 | Ht 64.0 in | Wt 228.0 lb

## 2018-05-22 DIAGNOSIS — R600 Localized edema: Secondary | ICD-10-CM

## 2018-05-22 DIAGNOSIS — I5033 Acute on chronic diastolic (congestive) heart failure: Secondary | ICD-10-CM

## 2018-05-22 DIAGNOSIS — R609 Edema, unspecified: Secondary | ICD-10-CM

## 2018-05-22 MED ORDER — FUROSEMIDE 80 MG PO TABS: 80.0000 mg | ORAL_TABLET | Freq: Every day | ORAL | 6 refills | Status: DC

## 2018-05-22 NOTE — Progress Notes (Signed)
Clinical Summary [name] is a 72 y.o.female seen today for follow up of the following medical problems. This is a focused visit on recent leg swelling.   1. Leg edema - recent leg edema and weight loss. Started on lasix by pcp -11/2015 LE venous US [place]: no DVT   09/2017 echo LVEF 60-65%, no WMAs, Diastolic function is abnormal, indeterminant grade  - increased leg swelling. Seen in ER 05/19/09 - takes lasix 40mg  daily, did not control swelling.  - has increased to 60mg  daily. Has not started KCl.     Past Medical History:  Diagnosis Date  . Allergy   . Bronchitis, chronic ([place])   . Chronic anxiety   . Complication of anesthesia    hard to wake up  . COPD (chronic obstructive pulmonary disease) ([place])   . Depression   . Esophageal reflux   . Headache(784.0)   . Hyperlipidemia   . Hypertension   . Hypothyroidism   . Obesity   . Osteoporosis   . Overactive bladder   . PVC's (premature ventricular contractions)   . Thyroid disease   . Vitamin D deficiency disease      Allergies  Allergen Reactions  . Celebrex [Celecoxib] Swelling  . Keflet [Cephalexin] Swelling  . Penicillins Hives and Swelling    DID THE REACTION INVOLVE: Swelling of the face/tongue/throat, SOB, or low BP? Yes-swelling-hives Sudden or severe rash/hives, skin peeling, or the inside of the mouth or nose? Unknown Did it require medical treatment? Unknown When did it last happen?over 10 years If all above answers are "NO", may proceed with cephalosporin use.   . Sulfa Antibiotics Swelling  . Kenalog [Triamcinolone Acetonide]     unknown  . Lisinopril Other (See Comments)    unknown     Current Outpatient Medications  Medication Sig Dispense Refill  . acetaminophen (TYLENOL) 650 MG CR tablet Take 650 mg by mouth every 8 (eight) hours as needed for pain.    Marland Kitchen ALPRAZolam (XANAX) 0.5 MG tablet Take 1 tablet (0.5 mg total) by mouth 3 (three) times daily as needed for anxiety.  90 tablet 5  . aspirin 81 MG EC tablet Take 1 tablet (81 mg total) by mouth daily. Swallow whole. 30 tablet 12  . calcium carbonate (OS-CAL - DOSED IN MG OF ELEMENTAL CALCIUM) 1250 (500 Ca) MG tablet Take 1 tablet by mouth once a week.     . cholecalciferol (VITAMIN D) 1000 units tablet Take 1,000 Units by mouth daily.    Marland Kitchen FLUoxetine (PROZAC) 20 MG capsule TAKE 1 TO 2 CAPSULES DAILY (Patient taking differently: Take 40 mg by mouth daily. ) 180 capsule 3  . furosemide (LASIX) 20 MG tablet TAKE ONE (1) TABLET EACH DAY (Patient taking differently: Take 40 mg by mouth daily. ) 30 tablet 3  . levothyroxine (SYNTHROID, LEVOTHROID) 50 MCG tablet TAKE ONE (1) TABLET EACH DAY (Patient taking differently: Take 50 mcg by mouth daily before breakfast. ) 90 tablet 3  . Multiple Vitamin (MULTIVITAMIN) tablet Take 1 tablet by mouth 2 (two) times a week.     Marland Kitchen omeprazole-sodium bicarbonate (ZEGERID) 40-1100 MG capsule Take 1 capsule by mouth daily before breakfast. (Patient taking differently: Take 1 capsule by mouth daily as needed (for indogestion). )    . oxyCODONE-acetaminophen (PERCOCET/ROXICET) 5-325 MG tablet Take 1 tablet by mouth every 6 (six) hours as needed. (Patient taking differently: Take 1 tablet by mouth every 6 (six) hours as needed for moderate pain or  severe pain. ) 30 tablet 0  . potassium chloride SA (K-DUR,KLOR-CON) 20 MEQ tablet Take 1 tablet (20 mEq total) by mouth daily. 30 tablet 0  . telmisartan (MICARDIS) 40 MG tablet TAKE ONE (1) TABLET EACH DAY (Patient taking differently: Take 40 mg by mouth daily. ) 90 tablet 3   No current facility-administered medications for this visit.      Past Surgical History:  Procedure Laterality Date  . ABDOMINAL HYSTERECTOMY    . CHOLECYSTECTOMY N/A 11/04/2013   Procedure: LAPAROSCOPIC CHOLECYSTECTOMY WITH INTRAOPERATIVE CHOLANGIOGRAM;  Surgeon: Imogene Burn. Georgette Dover, MD;  Location: [facility];  Service: General;  Laterality: N/A;  . COLONOSCOPY    . FRACTURE  SURGERY     pins removed from Hip surgery  . HIP FRACTURE SURGERY  1990    pins removed in 1991  . PARTIAL HYSTERECTOMY  1987  . SPINAL FUSION    . UPPER GASTROINTESTINAL ENDOSCOPY       Allergies  Allergen Reactions  . Celebrex [Celecoxib] Swelling  . Keflet [Cephalexin] Swelling  . Penicillins Hives and Swelling    DID THE REACTION INVOLVE: Swelling of the face/tongue/throat, SOB, or low BP? Yes-swelling-hives Sudden or severe rash/hives, skin peeling, or the inside of the mouth or nose? Unknown Did it require medical treatment? Unknown When did it last happen?over 10 years If all above answers are "NO", may proceed with cephalosporin use.   . Sulfa Antibiotics Swelling  . Kenalog [Triamcinolone Acetonide]     unknown  . Lisinopril Other (See Comments)    unknown      Family History  Problem Relation Age of Onset  . Arthritis Other   . Heart disease Other   . Cancer Other   . Diabetes Other   . OCD Other      Social History [name] reports that she has never smoked. She has never used smokeless tobacco. [name] reports no history of alcohol use.   Review of Systems CONSTITUTIONAL: No weight loss, fever, chills, weakness or fatigue.  HEENT: Eyes: No visual loss, blurred vision, double vision or yellow sclerae.No hearing loss, sneezing, congestion, runny nose or sore throat.  SKIN: No rash or itching.  CARDIOVASCULAR: no chest pain, no palpitations RESPIRATORY: No shortness of breath, cough or sputum.  GASTROINTESTINAL: No anorexia, nausea, vomiting or diarrhea. No abdominal pain or blood.  GENITOURINARY: No burning on urination, no polyuria NEUROLOGICAL: No headache, dizziness, syncope, paralysis, ataxia, numbness or tingling in the extremities. No change in bowel or bladder control.  MUSCULOSKELETAL: No muscle, back pain, joint pain or stiffness.  LYMPHATICS: No enlarged nodes. No history of splenectomy.  PSYCHIATRIC: No history of depression or  anxiety.  ENDOCRINOLOGIC: No reports of sweating, cold or heat intolerance. No polyuria or polydipsia.  Marland Kitchen   Physical Examination Vitals:   05/22/18 1359  BP: 137/74  Pulse: 70  SpO2: 96%   Vitals:   05/22/18 1359  Weight: 228 lb (103.4 kg)  Height: 5\' 4"  (1.626 m)    Gen: resting comfortably, no acute distress HEENT: no scleral icterus, pupils equal round and reactive, no palptable cervical adenopathy,  CV: RRR, no m/r/g, n ojvd Resp: Clear to auscultation bilaterally GI: abdomen is soft, non-tender, non-distended, normal bowel sounds, no hepatosplenomegaly MSK: extremities are warm, 2+ bilateral edema.  Skin: warm, no rash Neuro:  no focal deficits Psych: appropriate affect   Diagnostic Studies 09/2017 echo Study Conclusions  - Left ventricle: The cavity size was normal. Wall thickness was increased in a  pattern of mild LVH. Systolic function was normal. The estimated ejection fraction was in the range of 60% to 65%. Wall motion was normal; there were no regional wall motion abnormalities. - Aortic valve: Mildly calcified annulus. Trileaflet; mildly thickened leaflets. Mean gradient (S): 8 mm Hg. Valve area (VTI): 2.13 cm^2. Valve area (Vmax): 1.86 cm^2. Valve area (Vmean): 2.05 cm^2. - Left atrium: The atrium was severely dilated. - Right atrium: The atrium was mildly dilated. - Technically adequate study.      Assessment and Plan  1. Leg edema - suspect multifactorial including acute on chronic diastolic HF, venous insufficiency, and possibly some lyphedema - we will increase lasix to 80mg  daily. Check BMET/Mg/TSH next week. She is to update [place] on weights next week - will see how much she improves with increased diuresis, likely will need referal to lymphedema clinic if cannot get fluid down with diuretics alone. Likely try some compressoin stockings once swelling is down    F/u 1 month    Arnoldo Lenis, M.D., F.A.C.C.

## 2018-05-22 NOTE — Patient Instructions (Signed)
Medication Instructions:   Increase Lasix to 80mg  daily.   Continue all other medications.    Labwork:  BMET, Magnesium, TSH - orders given today.   Please do next week - Tuesday or Wednesday.   Office will contact with results via phone or letter.    Testing/Procedures: none  Follow-Up: 1 month   Any Other Special Instructions Will Be Listed Below (If Applicable). Call with update on daily weights next week.   If you need a refill on your cardiac medications before your next appointment, please call your pharmacy.

## 2018-05-28 ENCOUNTER — Telehealth: Payer: Self-pay | Admitting: *Deleted

## 2018-05-28 DIAGNOSIS — R600 Localized edema: Secondary | ICD-10-CM | POA: Diagnosis not present

## 2018-05-28 DIAGNOSIS — I10 Essential (primary) hypertension: Secondary | ICD-10-CM | POA: Diagnosis not present

## 2018-05-28 NOTE — Telephone Encounter (Signed)
Pt reporting weight since increase of lasix 80 mg daily - 05/23/18 wt 227lbs, 05/24/18 wt 226lbs, 05/25/18 wt 224lbs, 05/26/18 226lbs, 05/27/18 224lbs, today 05/28/18 wt 223lbs - will have labs done today or tomorrow at [facility] says she is still having some swelling in both legs and is unable to wear compression stockings for a long period of time - denies any other symptoms

## 2018-05-28 NOTE — Telephone Encounter (Signed)
Reassuring that weight is coming down. Continue current therapy for now. Have her call us back next week to update weights/symptoms.

## 2018-05-29 ENCOUNTER — Telehealth: Payer: Self-pay | Admitting: Cardiology

## 2018-05-29 ENCOUNTER — Encounter: Payer: Self-pay | Admitting: *Deleted

## 2018-05-29 NOTE — Telephone Encounter (Signed)
Asking for lab results 

## 2018-05-29 NOTE — Telephone Encounter (Signed)
Patient contacted and says she did her lab work at [facility]. Patient advised that results have not been received yet but would be requested today and Dr. Harl Bowie would review them upon his return to the office next week. Verbalized understanding.

## 2018-05-29 NOTE — Telephone Encounter (Signed)
Pt aware and says she will update [place] next week and still awaiting lab results requested from [facility]

## 2018-06-02 ENCOUNTER — Telehealth: Payer: Self-pay | Admitting: *Deleted

## 2018-06-02 NOTE — Telephone Encounter (Signed)
Patient called to request lab results and also give home weight readings: see results below 05/30/18 223.6 lbs 05/31/18 223.2 lbs 06/01/18 221.4 lbs 06/02/18 221.6 lbs Patient advised that lab work was requested on 05/29/18 and again today from [facility]. Medications reconciled.

## 2018-06-03 NOTE — Telephone Encounter (Signed)
Patient informed and verbalized understanding of plan. 

## 2018-06-03 NOTE — Telephone Encounter (Signed)
Labs look fine. Mild downtrend in weights shows she is starting to lose some fluid, can she update [place] again next week, continue current meds   J BrancH MD

## 2018-06-09 ENCOUNTER — Ambulatory Visit: Payer: Medicare Other | Admitting: Physician Assistant

## 2018-06-11 DIAGNOSIS — M4716 Other spondylosis with myelopathy, lumbar region: Secondary | ICD-10-CM | POA: Diagnosis not present

## 2018-06-12 ENCOUNTER — Telehealth: Payer: Self-pay | Admitting: *Deleted

## 2018-06-12 NOTE — Telephone Encounter (Signed)
Pt weight for the last few days:  06/09/18 214LBS 06/10/18 212LBS 06/11/18 212LBS

## 2018-06-13 ENCOUNTER — Other Ambulatory Visit: Payer: Self-pay | Admitting: Physician Assistant

## 2018-06-13 DIAGNOSIS — F419 Anxiety disorder, unspecified: Secondary | ICD-10-CM

## 2018-06-15 NOTE — Telephone Encounter (Signed)
Weights continue to move in a good direction, conitnue current meds. Will reevaluate at our f/u in Feb   J Cindy Komperda MD

## 2018-06-15 NOTE — Telephone Encounter (Signed)
Pt voiced understanding

## 2018-06-15 NOTE — Telephone Encounter (Signed)
Last seen 10/01/17

## 2018-06-16 ENCOUNTER — Other Ambulatory Visit: Payer: Self-pay | Admitting: Cardiology

## 2018-06-29 ENCOUNTER — Ambulatory Visit (INDEPENDENT_AMBULATORY_CARE_PROVIDER_SITE_OTHER): Payer: Medicare Other | Admitting: Cardiology

## 2018-06-29 ENCOUNTER — Encounter: Payer: Self-pay | Admitting: Cardiology

## 2018-06-29 VITALS — BP 89/56 | HR 58 | Ht 64.0 in | Wt 207.0 lb

## 2018-06-29 DIAGNOSIS — I890 Lymphedema, not elsewhere classified: Secondary | ICD-10-CM | POA: Diagnosis not present

## 2018-06-29 DIAGNOSIS — R609 Edema, unspecified: Secondary | ICD-10-CM | POA: Diagnosis not present

## 2018-06-29 DIAGNOSIS — I5032 Chronic diastolic (congestive) heart failure: Secondary | ICD-10-CM

## 2018-06-29 MED ORDER — FUROSEMIDE 40 MG PO TABS: ORAL_TABLET | ORAL | 1 refills | Status: DC

## 2018-06-29 NOTE — Progress Notes (Signed)
Clinical Summary Ms. Wiemers is a 73 y.o.female seen today for follow up of the following medical problems.   1. Leg edema/Chronic diastolic HF - recent leg edema and weight loss. Started on lasix by pcp -11/2015 LE venous US [place]: no DVT   09/2017 echo LVEF 87-86%, no WMAs,Diastolic function is abnormal, indeterminant grade  - increased leg swelling. Seen in ER 05/19/09 - takes lasix 40mg  daily, did not control swelling.  - has increased to 60mg  daily. Has not started KCl.   - last visit increased lasix to 80mg  daily. Weights began to trend down  Weight down from 228 lbs in 04/2018 to 207 lbs.  - May 28, 2018 labs Cr 1.29 BUN 22 (prior Cr 1.04 and BUN 17)  - has not weighed at home recently. Some recent lightheadedness and dizziness.  - limiting sodium intake.    Past Medical History:  Diagnosis Date  . Allergy   . Bronchitis, chronic ([place])   . Chronic anxiety   . Complication of anesthesia    hard to wake up  . COPD (chronic obstructive pulmonary disease) ([place])   . Depression   . Esophageal reflux   . Headache(784.0)   . Hyperlipidemia   . Hypertension   . Hypothyroidism   . Obesity   . Osteoporosis   . Overactive bladder   . PVC's (premature ventricular contractions)   . Thyroid disease   . Vitamin D deficiency disease      Allergies  Allergen Reactions  . Celebrex [Celecoxib] Swelling  . Keflet [Cephalexin] Swelling  . Penicillins Hives and Swelling    DID THE REACTION INVOLVE: Swelling of the face/tongue/throat, SOB, or low BP? Yes-swelling-hives Sudden or severe rash/hives, skin peeling, or the inside of the mouth or nose? Unknown Did it require medical treatment? Unknown When did it last happen?over 10 years If all above answers are "NO", may proceed with cephalosporin use.   . Sulfa Antibiotics Swelling  . Kenalog [Triamcinolone Acetonide]     unknown  . Lisinopril Other (See Comments)    unknown     Current Outpatient  Medications  Medication Sig Dispense Refill  . acetaminophen (TYLENOL) 650 MG CR tablet Take 650 mg by mouth every 8 (eight) hours as needed for pain.    Marland Kitchen ALPRAZolam (XANAX) 0.5 MG tablet TAKE 1 TABLET 3 TIMES DAILY AS NEEDED FOR ANXIETY 90 tablet 0  . aspirin 81 MG EC tablet Take 1 tablet (81 mg total) by mouth daily. Swallow whole. 30 tablet 12  . calcium carbonate (OS-CAL - DOSED IN MG OF ELEMENTAL CALCIUM) 1250 (500 Ca) MG tablet Take 1 tablet by mouth once a week.     . cholecalciferol (VITAMIN D) 1000 units tablet Take 1,000 Units by mouth daily.    Marland Kitchen FLUoxetine (PROZAC) 20 MG capsule TAKE 1 TO 2 CAPSULES DAILY (Patient taking differently: Take 40 mg by mouth daily. ) 180 capsule 3  . furosemide (LASIX) 80 MG tablet Take 1 tablet (80 mg total) by mouth daily. 30 tablet 6  . levothyroxine (SYNTHROID, LEVOTHROID) 50 MCG tablet TAKE ONE (1) TABLET EACH DAY (Patient taking differently: Take 50 mcg by mouth daily before breakfast. ) 90 tablet 3  . Multiple Vitamin (MULTIVITAMIN) tablet Take 1 tablet by mouth 2 (two) times a week.     Marland Kitchen omeprazole-sodium bicarbonate (ZEGERID) 40-1100 MG capsule Take 1 capsule by mouth daily before breakfast. (Patient taking differently: Take 1 capsule by mouth daily as needed (for indogestion). )    .  oxyCODONE-acetaminophen (PERCOCET/ROXICET) 5-325 MG tablet Take 1 tablet by mouth every 6 (six) hours as needed. (Patient taking differently: Take 1 tablet by mouth every 6 (six) hours as needed for moderate pain or severe pain. ) 30 tablet 0  . potassium chloride SA (K-DUR,KLOR-CON) 20 MEQ tablet TAKE ONE (1) TABLET EACH DAY 90 tablet 2  . telmisartan (MICARDIS) 40 MG tablet TAKE ONE (1) TABLET EACH DAY (Patient taking differently: Take 40 mg by mouth daily. ) 90 tablet 3   No current facility-administered medications for this visit.      Past Surgical History:  Procedure Laterality Date  . ABDOMINAL HYSTERECTOMY    . CHOLECYSTECTOMY N/A 11/04/2013   Procedure:  LAPAROSCOPIC CHOLECYSTECTOMY WITH INTRAOPERATIVE CHOLANGIOGRAM;  Surgeon: Imogene Burn. Georgette Dover, MD;  Location: [facility];  Service: General;  Laterality: N/A;  . COLONOSCOPY    . FRACTURE SURGERY     pins removed from Hip surgery  . HIP FRACTURE SURGERY  1990    pins removed in 1991  . PARTIAL HYSTERECTOMY  1987  . SPINAL FUSION    . UPPER GASTROINTESTINAL ENDOSCOPY       Allergies  Allergen Reactions  . Celebrex [Celecoxib] Swelling  . Keflet [Cephalexin] Swelling  . Penicillins Hives and Swelling    DID THE REACTION INVOLVE: Swelling of the face/tongue/throat, SOB, or low BP? Yes-swelling-hives Sudden or severe rash/hives, skin peeling, or the inside of the mouth or nose? Unknown Did it require medical treatment? Unknown When did it last happen?over 10 years If all above answers are "NO", may proceed with cephalosporin use.   . Sulfa Antibiotics Swelling  . Kenalog [Triamcinolone Acetonide]     unknown  . Lisinopril Other (See Comments)    unknown      Family History  Problem Relation Age of Onset  . Arthritis Other   . Heart disease Other   . Cancer Other   . Diabetes Other   . OCD Other      Social History Ms. Lassen reports that she has never smoked. She has never used smokeless tobacco. Ms. Mcmaster reports no history of alcohol use.   Review of Systems CONSTITUTIONAL: No weight loss, fever, chills, weakness or fatigue.  HEENT: Eyes: No visual loss, blurred vision, double vision or yellow sclerae.No hearing loss, sneezing, congestion, runny nose or sore throat.  SKIN: No rash or itching.  CARDIOVASCULAR: per hpi RESPIRATORY: No shortness of breath, cough or sputum.  GASTROINTESTINAL: No anorexia, nausea, vomiting or diarrhea. No abdominal pain or blood.  GENITOURINARY: No burning on urination, no polyuria NEUROLOGICAL: per hpi MUSCULOSKELETAL: No muscle, back pain, joint pain or stiffness.  LYMPHATICS: No enlarged nodes. No history of splenectomy.    PSYCHIATRIC: No history of depression or anxiety.  ENDOCRINOLOGIC: No reports of sweating, cold or heat intolerance. No polyuria or polydipsia.  Marland Kitchen   Physical Examination Vitals:   06/29/18 1444  BP: (!) 89/56  Pulse: (!) 58  SpO2: 100%   Vitals:   06/29/18 1444  Weight: 207 lb (93.9 kg)  Height: 5\' 4"  (1.626 m)    Gen: resting comfortably, no acute distress HEENT: no scleral icterus, pupils equal round and reactive, no palptable cervical adenopathy,  CV: RRR, no m/r/g, no jvd Resp: Clear to auscultation bilaterally GI: abdomen is soft, non-tender, non-distended, normal bowel sounds, no hepatosplenomegaly MSK: extremities are warm, no edema.  Skin: warm, no rash Neuro:  no focal deficits Psych: appropriate affect   Diagnostic Studies 09/2017 echo Study Conclusions  - Left  ventricle: The cavity size was normal. Wall thickness was increased in a pattern of mild LVH. Systolic function was normal. The estimated ejection fraction was in the range of 60% to 65%. Wall motion was normal; there were no regional wall motion abnormalities. - Aortic valve: Mildly calcified annulus. Trileaflet; mildly thickened leaflets. Mean gradient (S): 8 mm Hg. Valve area (VTI): 2.13 cm^2. Valve area (Vmax): 1.86 cm^2. Valve area (Vmean): 2.05 cm^2. - Left atrium: The atrium was severely dilated. - Right atrium: The atrium was mildly dilated. - Technically adequate study.    Assessment and Plan  1. Leg edema/Chronic diastolic HF - significant diuresis, down 21 lbs since December - low bp's suggest possibly overdiuresed. Suspect her remaining edema is more related to lymphedema - we will send for labs with BMET/Mg. Hold lasix for 2 days then resume lasix 80mg  alternating days with 60mg .  - refer to lymphedema clinic            Arnoldo Lenis, M.D

## 2018-06-29 NOTE — Patient Instructions (Signed)
Your physician recommends that you schedule a follow-up appointment in: [place] has recommended you make the following change in your medication:   [place] 2 DAYS THEN  TAKE 80 MG EVERY OTHER DAY ALTERNATING WITH 60 MG EVERY OTHER DAY  Your physician recommends that you return for lab work BMP/MG  You have been referred to [facility]   Thank you for choosing [facility]!!

## 2018-06-30 ENCOUNTER — Ambulatory Visit (INDEPENDENT_AMBULATORY_CARE_PROVIDER_SITE_OTHER): Payer: Medicare Other | Admitting: Physician Assistant

## 2018-06-30 ENCOUNTER — Encounter: Payer: Self-pay | Admitting: Physician Assistant

## 2018-06-30 ENCOUNTER — Other Ambulatory Visit: Payer: Self-pay | Admitting: *Deleted

## 2018-06-30 VITALS — BP 109/60 | HR 61 | Temp 96.7°F | Ht 64.0 in | Wt 208.8 lb

## 2018-06-30 DIAGNOSIS — I10 Essential (primary) hypertension: Secondary | ICD-10-CM | POA: Diagnosis not present

## 2018-06-30 DIAGNOSIS — R609 Edema, unspecified: Secondary | ICD-10-CM | POA: Diagnosis not present

## 2018-06-30 DIAGNOSIS — L0390 Cellulitis, unspecified: Secondary | ICD-10-CM | POA: Diagnosis not present

## 2018-06-30 DIAGNOSIS — F332 Major depressive disorder, recurrent severe without psychotic features: Secondary | ICD-10-CM

## 2018-06-30 MED ORDER — ESCITALOPRAM OXALATE 20 MG PO TABS: 20.0000 mg | ORAL_TABLET | Freq: Every day | ORAL | 5 refills | Status: DC

## 2018-06-30 MED ORDER — AZITHROMYCIN 250 MG PO TABS: ORAL_TABLET | ORAL | 0 refills | Status: DC

## 2018-07-01 ENCOUNTER — Telehealth: Payer: Self-pay | Admitting: *Deleted

## 2018-07-01 DIAGNOSIS — I10 Essential (primary) hypertension: Secondary | ICD-10-CM

## 2018-07-01 LAB — BASIC METABOLIC PANEL
BUN/Creatinine Ratio: 17 (ref 12–28)
BUN: 44 mg/dL — ABNORMAL HIGH (ref 8–27)
CO2: 19 mmol/L — ABNORMAL LOW (ref 20–29)
Calcium: 9.9 mg/dL (ref 8.7–10.3)
Chloride: 92 mmol/L — ABNORMAL LOW (ref 96–106)
Creatinine, Ser: 2.64 mg/dL — ABNORMAL HIGH (ref 0.57–1.00)
GFR calc Af Amer: 20 mL/min/{1.73_m2} — ABNORMAL LOW (ref >59)
GFR calc non Af Amer: 18 mL/min/{1.73_m2} — ABNORMAL LOW (ref >59)
Glucose: 89 mg/dL (ref 65–99)
POTASSIUM: 4.7 mmol/L (ref 3.5–5.2)
Sodium: 131 mmol/L — ABNORMAL LOW (ref 134–144)

## 2018-07-01 LAB — MAGNESIUM: Magnesium: 2 mg/dL (ref 1.6–2.3)

## 2018-07-01 NOTE — Telephone Encounter (Signed)
Pt voiced understanding - pt will have repeat lab work done at [facility] (orders placed) routed to pcp

## 2018-07-01 NOTE — Progress Notes (Signed)
BP 109/60   Pulse 61   Temp (!) 96.7 F (35.9 C) (Oral)   Ht 5\' 4"  (1.626 m)   Wt 208 lb 12.8 oz (94.7 kg)   BMI 35.84 kg/m    Subjective:    Patient ID: [name], female    DOB: [birth year], 73 y.o.   MRN: [id number]  HPI: [name] is a 73 y.o. female presenting on 06/30/2018 for Edema and Ear Pain  This patient comes in for periodic recheck on medications and conditions including cellulitis in her lower legs that is intermittent.  She does have edema in the lower leg also that is intermittent.  She is going to be going to lymphedema clinic.  Her cardiologist has made a referral.  I think this would be a very good idea.  She deals with the edema on a very regular basis.  And she is starting to have more cellulitis changes.  She is also here for hypertension and depression.  All of her medications are reviewed.  She still has a lot of difficulty with a adult daughter who stays at home and has severe mental illness and OCD.  She has a severe visualizing patterning and will stay up all night, she will also have to wash her hands incessantly.  She will wrap them to prevent from getting germs.  [name] is very tired from having to deal with this..   All medications are reviewed today. There are no reports of any problems with the medications. All of the medical conditions are reviewed and updated.  Lab work is reviewed and will be ordered as medically necessary. There are no new problems reported with today's visit.   Past Medical History:  Diagnosis Date  . Allergy   . Bronchitis, chronic ([place])   . Chronic anxiety   . Complication of anesthesia    hard to wake up  . COPD (chronic obstructive pulmonary disease) ([place])   . Depression   . Esophageal reflux   . Headache(784.0)   . Hyperlipidemia   . Hypertension   . Hypothyroidism   . Obesity   . Osteoporosis   . Overactive bladder   . PVC's (premature ventricular contractions)   . Thyroid disease   . Vitamin D deficiency  disease    Relevant past medical, surgical, family and social history reviewed and updated as indicated. Interim medical history since our last visit reviewed. Allergies and medications reviewed and updated. DATA REVIEWED: CHART IN EPIC  Family History reviewed for pertinent findings.  Review of Systems  Constitutional: Negative.   HENT: Negative.   Eyes: Negative.   Respiratory: Negative.   Cardiovascular: Positive for leg swelling.  Gastrointestinal: Negative.   Genitourinary: Negative.   Skin: Positive for color change.    Allergies as of 06/30/2018      Reactions   Celebrex [celecoxib] Swelling   Keflet [cephalexin] Swelling   Penicillins Hives, Swelling   DID THE REACTION INVOLVE: Swelling of the face/tongue/throat, SOB, or low BP? Yes-swelling-hives Sudden or severe rash/hives, skin peeling, or the inside of the mouth or nose? Unknown Did it require medical treatment? Unknown When did it last happen?over 10 years If all above answers are "NO", may proceed with cephalosporin use.   Sulfa Antibiotics Swelling   Kenalog [triamcinolone Acetonide]    unknown   Lisinopril Other (See Comments)   unknown      Medication List       Accurate as of June 30, 2018 11:59 PM. Always use  your most recent med list.        acetaminophen 650 MG CR tablet Commonly known as:  TYLENOL Take 650 mg by mouth every 8 (eight) hours as needed for pain.   ALPRAZolam 0.5 MG tablet Commonly known as:  XANAX TAKE 1 TABLET 3 TIMES DAILY AS NEEDED FOR ANXIETY   aspirin 81 MG EC tablet Take 1 tablet (81 mg total) by mouth daily. Swallow whole.   azithromycin 250 MG tablet Commonly known as:  ZITHROMAX Z-PAK Take as directed   calcium carbonate 1250 (500 Ca) MG tablet Commonly known as:  OS-CAL - dosed in mg of elemental calcium Take 1 tablet by mouth once a week.   cholecalciferol 1000 units tablet Commonly known as:  VITAMIN D Take 1,000 Units by mouth daily.     escitalopram 20 MG tablet Commonly known as:  LEXAPRO Take 1 tablet (20 mg total) by mouth daily.   furosemide 40 MG tablet Commonly known as:  LASIX TAKE 2 TABLETS EVERY OTHER DAY ALTERNATING WITH 1.5 TABLETS EVERY OTHER DAY   levothyroxine 50 MCG tablet Commonly known as:  SYNTHROID, LEVOTHROID TAKE ONE (1) TABLET EACH DAY   multivitamin tablet Take 1 tablet by mouth 2 (two) times a week.   omeprazole-sodium bicarbonate 40-1100 MG capsule Commonly known as:  ZEGERID Take 1 capsule by mouth daily before breakfast.   oxyCODONE-acetaminophen 5-325 MG tablet Commonly known as:  PERCOCET/ROXICET Take 1 tablet by mouth every 6 (six) hours as needed.   potassium chloride SA 20 MEQ tablet Commonly known as:  K-DUR,KLOR-CON TAKE ONE (1) TABLET EACH DAY   telmisartan 40 MG tablet Commonly known as:  MICARDIS TAKE ONE (1) TABLET EACH DAY          Objective:    BP 109/60   Pulse 61   Temp (!) 96.7 F (35.9 C) (Oral)   Ht 5\' 4"  (1.626 m)   Wt 208 lb 12.8 oz (94.7 kg)   BMI 35.84 kg/m   Allergies  Allergen Reactions  . Celebrex [Celecoxib] Swelling  . Keflet [Cephalexin] Swelling  . Penicillins Hives and Swelling    DID THE REACTION INVOLVE: Swelling of the face/tongue/throat, SOB, or low BP? Yes-swelling-hives Sudden or severe rash/hives, skin peeling, or the inside of the mouth or nose? Unknown Did it require medical treatment? Unknown When did it last happen?over 10 years If all above answers are "NO", may proceed with cephalosporin use.   . Sulfa Antibiotics Swelling  . Kenalog [Triamcinolone Acetonide]     unknown  . Lisinopril Other (See Comments)    unknown    Wt Readings from Last 3 Encounters:  06/30/18 208 lb 12.8 oz (94.7 kg)  06/29/18 207 lb (93.9 kg)  05/22/18 228 lb (103.4 kg)    Physical Exam Constitutional:      Appearance: She is well-developed.  HENT:     Head: Normocephalic and atraumatic.  Eyes:     Conjunctiva/sclera:  Conjunctivae normal.     Pupils: Pupils are equal, round, and reactive to light.  Cardiovascular:     Rate and Rhythm: Normal rate and regular rhythm.     Heart sounds: Normal heart sounds.  Pulmonary:     Effort: Pulmonary effort is normal.     Breath sounds: Normal breath sounds.  Abdominal:     General: Bowel sounds are normal.     Palpations: Abdomen is soft.  Skin:    General: Skin is warm and dry.     Findings: Erythema  and rash present. Rash is macular.       Neurological:     Mental Status: She is alert and oriented to person, place, and time.     Deep Tendon Reflexes: Reflexes are normal and symmetric.  Psychiatric:        Behavior: Behavior normal.        Thought Content: Thought content normal.        Judgment: Judgment normal.     Results for orders placed or performed during the hospital encounter of 05/19/18  Brain natriuretic peptide  Result Value Ref Range   B Natriuretic Peptide 161.0 (H) 0.0 - 100.0 pg/mL  CBC  Result Value Ref Range   WBC 8.5 4.0 - 10.5 K/uL   RBC 4.19 3.87 - 5.11 MIL/uL   Hemoglobin 12.0 12.0 - 15.0 g/dL   HCT 37.9 36.0 - 46.0 %   MCV 90.5 80.0 - 100.0 fL   MCH 28.6 26.0 - 34.0 pg   MCHC 31.7 30.0 - 36.0 g/dL   RDW 13.1 11.5 - 15.5 %   Platelets 412 (H) 150 - 400 K/uL   nRBC 0.0 0.0 - 0.2 %  Basic metabolic panel  Result Value Ref Range   Sodium 138 135 - 145 mmol/L   Potassium 2.5 (LL) 3.5 - 5.1 mmol/L   Chloride 99 98 - 111 mmol/L   CO2 28 22 - 32 mmol/L   Glucose, Bld 109 (H) 70 - 99 mg/dL   BUN 17 8 - 23 mg/dL   Creatinine, Ser 1.04 (H) 0.44 - 1.00 mg/dL   Calcium 9.3 8.9 - 10.3 mg/dL   GFR calc non Af Amer 54 (L) >60 mL/min   GFR calc Af Amer >60 >60 mL/min   Anion gap 11 5 - 15  Hepatic function panel  Result Value Ref Range   Total Protein 7.5 6.5 - 8.1 g/dL   Albumin 4.2 3.5 - 5.0 g/dL   AST 26 15 - 41 U/L   ALT 17 0 - 44 U/L   Alkaline Phosphatase 95 38 - 126 U/L   Total Bilirubin 0.8 0.3 - 1.2 mg/dL    Bilirubin, Direct 0.2 0.0 - 0.2 mg/dL   Indirect Bilirubin 0.6 0.3 - 0.9 mg/dL      Assessment & Plan:   1. Cellulitis, unspecified cellulitis site - azithromycin (ZITHROMAX Z-PAK) 250 MG tablet; Take as directed  Dispense: 6 each; Refill: 0  2. Edema, unspecified type Continue medication  3. Essential hypertension Continue medications  4. Severe episode of recurrent major depressive disorder, without psychotic features ([place]) - escitalopram (LEXAPRO) 20 MG tablet; Take 1 tablet (20 mg total) by mouth daily.  Dispense: 30 tablet; Refill: 5   Continue all other maintenance medications as listed above.  Follow up plan: Return in about 4 months (around 10/29/2018) for recheck.  Educational handout given for [place] PA-C [place] [address]  [place], [place] [address] [phone]   07/01/2018, 1:28 PM

## 2018-07-01 NOTE — Telephone Encounter (Signed)
-----   Message from Arnoldo Lenis, MD sent at 07/01/2018  1:18 PM EST ----- Labs do show some increased stress on kidneys. I would continue to hold her fluid pill until Friday, and then resume at the previous dose of 80mg  alternating days with 60mg . Repeat BMET/Mg in 2 weeks   Zandra Abts MD

## 2018-07-15 ENCOUNTER — Ambulatory Visit (HOSPITAL_COMMUNITY): Payer: Medicare Other | Admitting: Physical Therapy

## 2018-07-23 DIAGNOSIS — M170 Bilateral primary osteoarthritis of knee: Secondary | ICD-10-CM | POA: Diagnosis not present

## 2018-08-03 ENCOUNTER — Other Ambulatory Visit: Payer: Self-pay | Admitting: Physician Assistant

## 2018-08-03 DIAGNOSIS — F419 Anxiety disorder, unspecified: Secondary | ICD-10-CM

## 2018-08-04 ENCOUNTER — Telehealth: Payer: Self-pay | Admitting: Physician Assistant

## 2018-08-04 NOTE — Telephone Encounter (Signed)
Patient aware.

## 2018-08-04 NOTE — Telephone Encounter (Signed)
appt scheduled Pt notified 

## 2018-08-04 NOTE — Telephone Encounter (Signed)
done

## 2018-08-04 NOTE — Telephone Encounter (Signed)
Last seen 06/30/2018

## 2018-08-07 ENCOUNTER — Other Ambulatory Visit: Payer: Self-pay

## 2018-08-07 ENCOUNTER — Ambulatory Visit (INDEPENDENT_AMBULATORY_CARE_PROVIDER_SITE_OTHER): Payer: Medicare Other | Admitting: Physician Assistant

## 2018-08-07 ENCOUNTER — Encounter: Payer: Self-pay | Admitting: Physician Assistant

## 2018-08-07 DIAGNOSIS — F419 Anxiety disorder, unspecified: Secondary | ICD-10-CM | POA: Diagnosis not present

## 2018-08-07 DIAGNOSIS — F332 Major depressive disorder, recurrent severe without psychotic features: Secondary | ICD-10-CM

## 2018-08-07 DIAGNOSIS — I10 Essential (primary) hypertension: Secondary | ICD-10-CM

## 2018-08-07 MED ORDER — PREDNISONE 10 MG PO TABS: 10.0000 mg | ORAL_TABLET | Freq: Every day | ORAL | 1 refills | Status: DC

## 2018-08-07 MED ORDER — ESCITALOPRAM OXALATE 20 MG PO TABS: 20.0000 mg | ORAL_TABLET | Freq: Every day | ORAL | 3 refills | Status: DC

## 2018-08-07 MED ORDER — LEVOTHYROXINE SODIUM 50 MCG PO TABS: ORAL_TABLET | ORAL | 3 refills | Status: DC

## 2018-08-07 MED ORDER — TELMISARTAN 40 MG PO TABS: ORAL_TABLET | ORAL | 3 refills | Status: DC

## 2018-08-07 MED ORDER — ALPRAZOLAM 0.5 MG PO TABS: ORAL_TABLET | ORAL | 5 refills | Status: DC

## 2018-08-08 LAB — BASIC METABOLIC PANEL
BUN/Creatinine Ratio: 15 (ref 12–28)
BUN: 19 mg/dL (ref 8–27)
CO2: 24 mmol/L (ref 20–29)
Calcium: 9.5 mg/dL (ref 8.7–10.3)
Chloride: 97 mmol/L (ref 96–106)
Creatinine, Ser: 1.29 mg/dL — ABNORMAL HIGH (ref 0.57–1.00)
GFR calc Af Amer: 48 mL/min/{1.73_m2} — ABNORMAL LOW (ref >59)
GFR calc non Af Amer: 42 mL/min/{1.73_m2} — ABNORMAL LOW (ref >59)
Glucose: 79 mg/dL (ref 65–99)
Potassium: 4.6 mmol/L (ref 3.5–5.2)
Sodium: 137 mmol/L (ref 134–144)

## 2018-08-08 LAB — MAGNESIUM: Magnesium: 2.2 mg/dL (ref 1.6–2.3)

## 2018-08-09 LAB — LIPID PANEL

## 2018-08-09 LAB — CMP14+EGFR

## 2018-08-09 LAB — SPECIMEN STATUS REPORT

## 2018-08-09 LAB — MAGNESIUM

## 2018-08-10 LAB — CBC WITH DIFFERENTIAL/PLATELET
Basophils Absolute: 0.1 10*3/uL (ref 0.0–0.2)
Basos: 1 %
EOS (ABSOLUTE): 0.2 10*3/uL (ref 0.0–0.4)
Eos: 2 %
Hematocrit: 37 % (ref 34.0–46.6)
Hemoglobin: 12.1 g/dL (ref 11.1–15.9)
Immature Grans (Abs): 0.1 10*3/uL (ref 0.0–0.1)
Immature Granulocytes: 1 %
LYMPHS: 26 %
Lymphocytes Absolute: 2.6 10*3/uL (ref 0.7–3.1)
MCH: 29.5 pg (ref 26.6–33.0)
MCHC: 32.7 g/dL (ref 31.5–35.7)
MCV: 90 fL (ref 79–97)
Monocytes Absolute: 0.7 10*3/uL (ref 0.1–0.9)
Monocytes: 7 %
Neutrophils Absolute: 6.4 10*3/uL (ref 1.4–7.0)
Neutrophils: 63 %
Platelets: 393 10*3/uL (ref 150–450)
RBC: 4.1 x10E6/uL (ref 3.77–5.28)
RDW: 13.7 % (ref 11.7–15.4)
WBC: 10 10*3/uL (ref 3.4–10.8)

## 2018-08-10 NOTE — Progress Notes (Signed)
BP 116/81   Pulse (!) 58   Temp 97.8 F (36.6 C) (Oral)   Ht '5\' 4"'$  (1.626 m)   Wt 207 lb (93.9 kg)   BMI 35.53 kg/m    Subjective:    Patient ID: [name], female    DOB: [birth year], 73 y.o.   MRN: [id number]  HPI: [name] is a 73 y.o. female presenting on 08/07/2018 for Hypothyroidism (3 month follow up ); Hypertension; and Hyperlipidemia  This patient comes in for recheck on her depression, hypertension, hyperlipidemia, hypothyroidism and anxiety.  She reports that she is doing well with her medications. Her blood pressure and pulse are very good today.  She does need an update of labs.  The change to Celexa has done very well and been well-tolerated.  Course she has chronic pain due to arthritis.  She is using a walker most of the time getting around.  Especially when she has to walk long distances. She still has a lots of stressors, financial, child at home with severe [place] phobia.  She rarely gets out whatsoever and then has certain phobias even within the house about touching things.  Ms. Kliethermes was obviously very distraught and talking about it.  Past Medical History:  Diagnosis Date  . Allergy   . Bronchitis, chronic ([place])   . Chronic anxiety   . Complication of anesthesia    hard to wake up  . COPD (chronic obstructive pulmonary disease) ([place])   . Depression   . Esophageal reflux   . Headache(784.0)   . Hyperlipidemia   . Hypertension   . Hypothyroidism   . Obesity   . Osteoporosis   . Overactive bladder   . PVC's (premature ventricular contractions)   . Thyroid disease   . Vitamin D deficiency disease    Relevant past medical, surgical, family and social history reviewed and updated as indicated. Interim medical history since our last visit reviewed. Allergies and medications reviewed and updated. DATA REVIEWED: CHART IN EPIC  Family History reviewed for pertinent findings.  Review of Systems  Constitutional: Positive for fatigue. Negative for  activity change and fever.  HENT: Negative.   Eyes: Negative.   Respiratory: Negative.  Negative for cough and shortness of breath.   Cardiovascular: Positive for leg swelling. Negative for chest pain and palpitations.  Gastrointestinal: Negative.  Negative for abdominal pain.  Endocrine: Negative.   Genitourinary: Negative.  Negative for dysuria.  Musculoskeletal: Positive for arthralgias and joint swelling.  Skin: Negative.   Neurological: Negative.   Psychiatric/Behavioral: The patient is nervous/anxious.     Allergies as of 08/07/2018      Reactions   Celebrex [celecoxib] Swelling   Keflet [cephalexin] Swelling   Penicillins Hives, Swelling   DID THE REACTION INVOLVE: Swelling of the face/tongue/throat, SOB, or low BP? Yes-swelling-hives Sudden or severe rash/hives, skin peeling, or the inside of the mouth or nose? Unknown Did it require medical treatment? Unknown When did it last happen?over 10 years If all above answers are "NO", may proceed with cephalosporin use.   Sulfa Antibiotics Swelling   Kenalog [triamcinolone Acetonide]    unknown   Lisinopril Other (See Comments)   unknown      Medication List       Accurate as of August 07, 2018 11:59 PM. Always use your most recent med list.        acetaminophen 650 MG CR tablet Commonly known as:  TYLENOL Take 650 mg by mouth every 8 (eight) hours  as needed for pain.   ALPRAZolam 0.5 MG tablet Commonly known as:  XANAX TAKE 1 TABLET 3 TIMES DAILY AS NEEDED FOR ANXIETY   aspirin 81 MG EC tablet Take 1 tablet (81 mg total) by mouth daily. Swallow whole.   calcium carbonate 1250 (500 Ca) MG tablet Commonly known as:  OS-CAL - dosed in mg of elemental calcium Take 1 tablet by mouth once a week.   cholecalciferol 1000 units tablet Commonly known as:  VITAMIN D Take 1,000 Units by mouth daily.   escitalopram 20 MG tablet Commonly known as:  Lexapro Take 1 tablet (20 mg total) by mouth daily.   furosemide  40 MG tablet Commonly known as:  LASIX TAKE 2 TABLETS EVERY OTHER DAY ALTERNATING WITH 1.5 TABLETS EVERY OTHER DAY   levothyroxine 50 MCG tablet Commonly known as:  SYNTHROID, LEVOTHROID TAKE ONE (1) TABLET EACH DAY   multivitamin tablet Take 1 tablet by mouth 2 (two) times a week.   omeprazole-sodium bicarbonate 40-1100 MG capsule Commonly known as:  Zegerid Take 1 capsule by mouth daily before breakfast.   potassium chloride SA 20 MEQ tablet Commonly known as:  K-DUR,KLOR-CON TAKE ONE (1) TABLET EACH DAY   predniSONE 10 MG tablet Commonly known as:  DELTASONE Take 1 tablet (10 mg total) by mouth daily with breakfast.   telmisartan 40 MG tablet Commonly known as:  MICARDIS TAKE ONE (1) TABLET EACH DAY          Objective:    BP 116/81   Pulse (!) 58   Temp 97.8 F (36.6 C) (Oral)   Ht '5\' 4"'$  (1.626 m)   Wt 207 lb (93.9 kg)   BMI 35.53 kg/m   Allergies  Allergen Reactions  . Celebrex [Celecoxib] Swelling  . Keflet [Cephalexin] Swelling  . Penicillins Hives and Swelling    DID THE REACTION INVOLVE: Swelling of the face/tongue/throat, SOB, or low BP? Yes-swelling-hives Sudden or severe rash/hives, skin peeling, or the inside of the mouth or nose? Unknown Did it require medical treatment? Unknown When did it last happen?over 10 years If all above answers are "NO", may proceed with cephalosporin use.   . Sulfa Antibiotics Swelling  . Kenalog [Triamcinolone Acetonide]     unknown  . Lisinopril Other (See Comments)    unknown    Wt Readings from Last 3 Encounters:  08/07/18 207 lb (93.9 kg)  06/30/18 208 lb 12.8 oz (94.7 kg)  06/29/18 207 lb (93.9 kg)    Physical Exam Constitutional:      Appearance: She is well-developed.  HENT:     Head: Normocephalic and atraumatic.  Eyes:     Conjunctiva/sclera: Conjunctivae normal.     Pupils: Pupils are equal, round, and reactive to light.  Cardiovascular:     Rate and Rhythm: Normal rate and regular  rhythm.     Heart sounds: Normal heart sounds.  Pulmonary:     Effort: Pulmonary effort is normal.     Breath sounds: Normal breath sounds.  Abdominal:     General: Bowel sounds are normal.     Palpations: Abdomen is soft.  Skin:    General: Skin is warm and dry.     Findings: No rash.  Neurological:     Mental Status: She is alert and oriented to person, place, and time.     Deep Tendon Reflexes: Reflexes are normal and symmetric.  Psychiatric:        Behavior: Behavior normal.  Thought Content: Thought content normal.        Judgment: Judgment normal.     Results for orders placed or performed in visit on 08/07/18  Magnesium  Result Value Ref Range   Magnesium WILL FOLLOW   CBC with Differential/Platelet  Result Value Ref Range   WBC 10.0 3.4 - 10.8 x10E3/uL   RBC 4.10 3.77 - 5.28 x10E6/uL   Hemoglobin 12.1 11.1 - 15.9 g/dL   Hematocrit 37.0 34.0 - 46.6 %   MCV 90 79 - 97 fL   MCH 29.5 26.6 - 33.0 pg   MCHC 32.7 31.5 - 35.7 g/dL   RDW 13.7 11.7 - 15.4 %   Platelets 393 150 - 450 x10E3/uL   Neutrophils 63 Not Estab. %   Lymphs 26 Not Estab. %   Monocytes 7 Not Estab. %   Eos 2 Not Estab. %   Basos 1 Not Estab. %   Neutrophils Absolute 6.4 1.4 - 7.0 x10E3/uL   Lymphocytes Absolute 2.6 0.7 - 3.1 x10E3/uL   Monocytes Absolute 0.7 0.1 - 0.9 x10E3/uL   EOS (ABSOLUTE) 0.2 0.0 - 0.4 x10E3/uL   Basophils Absolute 0.1 0.0 - 0.2 x10E3/uL   Immature Granulocytes 1 Not Estab. %   Immature Grans (Abs) 0.1 0.0 - 0.1 x10E3/uL  CMP14+EGFR  Result Value Ref Range   Glucose WILL FOLLOW    BUN WILL FOLLOW    Creatinine, Ser WILL FOLLOW    GFR calc non Af Amer WILL FOLLOW    GFR calc Af Amer WILL FOLLOW    BUN/Creatinine Ratio WILL FOLLOW    Sodium WILL FOLLOW    Potassium WILL FOLLOW    Chloride WILL FOLLOW    CO2 WILL FOLLOW    Calcium WILL FOLLOW    Total Protein WILL FOLLOW    Albumin WILL FOLLOW    Globulin, Total WILL FOLLOW    Albumin/Globulin Ratio WILL  FOLLOW    Bilirubin Total WILL FOLLOW    Alkaline Phosphatase WILL FOLLOW    AST WILL FOLLOW    ALT WILL FOLLOW   Lipid panel  Result Value Ref Range   Cholesterol, Total WILL FOLLOW    Triglycerides WILL FOLLOW    HDL WILL FOLLOW    VLDL Cholesterol Cal WILL FOLLOW    LDL Calculated WILL FOLLOW    Comment: WILL FOLLOW    Chol/HDL Ratio WILL FOLLOW   Specimen status report  Result Value Ref Range   specimen status report Comment       Assessment & Plan:   1. Severe episode of recurrent major depressive disorder, without psychotic features ([place]) - escitalopram (LEXAPRO) 20 MG tablet; Take 1 tablet (20 mg total) by mouth daily.  Dispense: 90 tablet; Refill: 3  2. Essential hypertension - telmisartan (MICARDIS) 40 MG tablet; TAKE ONE (1) TABLET EACH DAY  Dispense: 90 tablet; Refill: 3 - BMP8+EGFR - Magnesium - CBC with Differential/Platelet - CMP14+EGFR - Lipid panel  3. Anxiety - ALPRAZolam (XANAX) 0.5 MG tablet; TAKE 1 TABLET 3 TIMES DAILY AS NEEDED FOR ANXIETY  Dispense: 90 tablet; Refill: 5   Continue all other maintenance medications as listed above.  Follow up plan: Return in about 6 months (around 02/07/2019) for recheck.  Educational handout given for [place] PA-C [place] [address]  [place], [place] [address] [phone]   08/10/2018, 7:50 AM

## 2018-08-14 ENCOUNTER — Telehealth: Payer: Self-pay | Admitting: *Deleted

## 2018-08-14 LAB — SPECIMEN STATUS REPORT

## 2018-08-14 NOTE — Telephone Encounter (Signed)
Pt aware - says weight and swelling has not changed - has been taking care of family member and has missed some doses of lasix - says she is getting back on track with this - routed to pcp

## 2018-08-14 NOTE — Telephone Encounter (Signed)
-----   Message from Arnoldo Lenis, MD sent at 08/11/2018 11:36 AM EDT ----- Kidney function much improved. How are her weights and swelling doing?   Zandra Abts MD

## 2018-08-18 ENCOUNTER — Telehealth: Payer: Self-pay | Admitting: Physician Assistant

## 2018-08-18 ENCOUNTER — Other Ambulatory Visit: Payer: Self-pay | Admitting: Physician Assistant

## 2018-08-18 MED ORDER — AZITHROMYCIN 250 MG PO TABS: ORAL_TABLET | ORAL | 0 refills | Status: DC

## 2018-08-18 NOTE — Telephone Encounter (Signed)
sent 

## 2018-08-18 NOTE — Telephone Encounter (Signed)
Pt aware.

## 2018-09-01 ENCOUNTER — Encounter: Payer: Self-pay | Admitting: Cardiology

## 2018-09-01 ENCOUNTER — Telehealth (INDEPENDENT_AMBULATORY_CARE_PROVIDER_SITE_OTHER): Payer: Medicare Other | Admitting: Cardiology

## 2018-09-01 VITALS — Ht 64.0 in | Wt 203.0 lb

## 2018-09-01 DIAGNOSIS — I890 Lymphedema, not elsewhere classified: Secondary | ICD-10-CM

## 2018-09-01 DIAGNOSIS — I5032 Chronic diastolic (congestive) heart failure: Secondary | ICD-10-CM | POA: Diagnosis not present

## 2018-09-01 DIAGNOSIS — R600 Localized edema: Secondary | ICD-10-CM

## 2018-09-01 MED ORDER — FUROSEMIDE 40 MG PO TABS: ORAL_TABLET | ORAL | 3 refills | Status: DC

## 2018-09-01 NOTE — Progress Notes (Signed)
Virtual Visit via Telephone Note   This visit type was conducted due to national recommendations for restrictions regarding the COVID-19 Pandemic (e.g. social distancing) in an effort to limit this patient's exposure and mitigate transmission in our community.  Due to her co-morbid illnesses, this patient is at least at moderate risk for complications without adequate follow up.  This format is felt to be most appropriate for this patient at this time.  The patient did not have access to video technology/had technical difficulties with video requiring transitioning to audio format only (telephone).  All issues noted in this document were discussed and addressed.  No physical exam could be performed with this format.  Please refer to the patient's chart for her  consent to telehealth for [facility].   Evaluation Performed:  Follow-up visit  Date:  09/01/2018   ID:  [name], [name] 08/09/1945, MRN [id number]  Patient Location: Home  Provider Location: Office  PCP:  Terald Sleeper, PA-C  Cardiologist:  Carlyle Dolly, MD  Electrophysiologist:  None   Chief Complaint:  Leg swelling  History of Present Illness:    [name] is a 73 y.o. female who presents via audio/video conferencing for a telehealth visit today.     1. Leg edema/Chronic diastolic HF - recent leg edema and weight loss. Started on lasix by pcp -11/2015 LE venous US [place]: no DVT   09/2017 echo LVEF 56-43%, no WMAs,Diastolic function is abnormal, indeterminant grade  - increased leg swelling. Seen in ER 05/19/09 - takes lasix 40mg  daily, did not control swelling.  - has increased to 60mg  daily. Has not started KCl.  -  increased lasix to 80mg  daily. Weights began to trend down  Weight down from 228 lbs in 04/2018 to 207 lbs.  - May 28, 2018 labs Cr 1.29 BUN 22 (prior Cr 1.04 and BUN 17)    - last visit in Feb 2020 we lowered her lasix to 80mg  alternative with 60mg , referred to lymphedema clinic.  Weight was down from 228 to 207 lbs at that visit - he labs after that appt showed Cr up from 1.04 to 2.64. Lasix was held a few days then resumed 80mg  alternating with 60mg .  - repeat labs 3/13 showd Cr down to 1.29.    - still with some swelling in legs. Weights down 203 lbs.  - she cancelled lymphedema clinic due to scheduling issues - some recent dizziness, feeling fatigued. .    The patient does not have symptoms concerning for COVID-19 infection (fever, chills, cough, or new shortness of breath).    Past Medical History:  Diagnosis Date  . Allergy   . Bronchitis, chronic ([place])   . Chronic anxiety   . Complication of anesthesia    hard to wake up  . COPD (chronic obstructive pulmonary disease) ([place])   . Depression   . Esophageal reflux   . Headache(784.0)   . Hyperlipidemia   . Hypertension   . Hypothyroidism   . Obesity   . Osteoporosis   . Overactive bladder   . PVC's (premature ventricular contractions)   . Thyroid disease   . Vitamin D deficiency disease    Past Surgical History:  Procedure Laterality Date  . ABDOMINAL HYSTERECTOMY    . CHOLECYSTECTOMY N/A 11/04/2013   Procedure: LAPAROSCOPIC CHOLECYSTECTOMY WITH INTRAOPERATIVE CHOLANGIOGRAM;  Surgeon: Imogene Burn. Georgette Dover, MD;  Location: [facility];  Service: General;  Laterality: N/A;  . COLONOSCOPY    . FRACTURE SURGERY  pins removed from Hip surgery  . HIP FRACTURE SURGERY  1990    pins removed in 1991  . PARTIAL HYSTERECTOMY  1987  . SPINAL FUSION    . UPPER GASTROINTESTINAL ENDOSCOPY       No outpatient medications have been marked as taking for the 09/01/18 encounter (Appointment) with Arnoldo Lenis, MD.     Allergies:   Celebrex [celecoxib]; Keflet [cephalexin]; Penicillins; Sulfa antibiotics; Kenalog [triamcinolone acetonide]; and Lisinopril   Social History   Tobacco Use  . Smoking status: Never Smoker  . Smokeless tobacco: Never Used  Substance Use Topics  . Alcohol use: No  . Drug use: No      Family Hx: The patient's family history includes Arthritis in an other family member; Cancer in an other family member; Diabetes in an other family member; Heart disease in an other family member; OCD in an other family member.  ROS:   Please see the history of present illness.     All other systems reviewed and are negative.   Prior CV studies:   The following studies were reviewed today:  09/2017 echo Study Conclusions  - Left ventricle: The cavity size was normal. Wall thickness was increased in a pattern of mild LVH. Systolic function was normal. The estimated ejection fraction was in the range of 60% to 65%. Wall motion was normal; there were no regional wall motion abnormalities. - Aortic valve: Mildly calcified annulus. Trileaflet; mildly thickened leaflets. Mean gradient (S): 8 mm Hg. Valve area (VTI): 2.13 cm^2. Valve area (Vmax): 1.86 cm^2. Valve area (Vmean): 2.05 cm^2. - Left atrium: The atrium was severely dilated. - Right atrium: The atrium was mildly dilated. - Technically adequate study.  Labs/Other Tests and Data Reviewed:    EKG:  n/a  Recent Labs: 05/19/2018: B Natriuretic Peptide 161.0 08/07/2018: ALT WILL FOLLOW; BUN WILL FOLLOW; Creatinine, Ser WILL FOLLOW; Hemoglobin 12.1; Magnesium WILL FOLLOW; Platelets 393; Potassium WILL FOLLOW; Sodium WILL FOLLOW   Recent Lipid Panel Lab Results  Component Value Date/Time   CHOL WILL FOLLOW 08/07/2018 04:16 PM   TRIG WILL FOLLOW 08/07/2018 04:16 PM   HDL WILL FOLLOW 08/07/2018 04:16 PM   CHOLHDL WILL FOLLOW 08/07/2018 04:16 PM   LDLCALC WILL FOLLOW 08/07/2018 04:16 PM    Wt Readings from Last 3 Encounters:  08/07/18 207 lb (93.9 kg)  06/30/18 208 lb 12.8 oz (94.7 kg)  06/29/18 207 lb (93.9 kg)     Objective:    Vital Signs:  There were no vitals taken for this visit.   Normal affect. Normal speech pattern. Sounds comfortable in no acute distress  ASSESSMENT & PLAN:    1. Leg  edema/Chronic diastolic HF - supsect combined diastolic HF and lymphedema - I think she has significantly diuresed from a CHF standpoint, signifnant bump in Cr few months ago would support that, I think her ongoing edema is her lymphedema. She did lose over 20 lbs with diuresis - with ongoing weight loss on current diuretic worried she is getting dry again, change lasix to 60mg  prn weight above 207 lbs  - not able to make lymphedema clinic appts at this time due to family constraints but also COVID-19, reassses potential next visit, she would favor the [facility] clinic   COVID-19 Education: The signs and symptoms of COVID-19 were discussed with the patient and how to seek care for testing (follow up with PCP or arrange E-visit).  The importance of social distancing was discussed today.  Time:  Today, I have spent 25 minutes with the patient with telehealth technology discussing the above problems.     Medication Adjustments/Labs and Tests Ordered: Current medicines are reviewed at length with the patient today.  Concerns regarding medicines are outlined above.  Tests Ordered: No orders of the defined types were placed in this encounter.  Medication Changes: No orders of the defined types were placed in this encounter.   Disposition:  Follow up 2 months  Signed, Carlyle Dolly, MD  09/01/2018 11:18 AM    [place]

## 2018-09-01 NOTE — Patient Instructions (Addendum)
Your physician recommends that you schedule a follow-up appointment in: [place] has recommended you make the following change in your medication:  CHANGE LASIX 60 MG (1 AND 1/2 TABLETS) AS NEEDED FOR WEIGHT OVER 207LBS  THE LYMPHEDEMA CLINIC IS [facility] [place]   Thank you for choosing [facility]!!

## 2018-09-10 ENCOUNTER — Telehealth: Payer: Self-pay | Admitting: Cardiology

## 2018-09-10 NOTE — Telephone Encounter (Signed)
Patient wanted and explanation of chronic diastolic heart failure. Advised that heart muscle is a little stiff when relaxing. Verbalized understanding.

## 2018-09-10 NOTE — Telephone Encounter (Signed)
Patient has questions about things she read on her AVS

## 2018-09-28 ENCOUNTER — Ambulatory Visit (INDEPENDENT_AMBULATORY_CARE_PROVIDER_SITE_OTHER): Payer: Medicare Other | Admitting: Physician Assistant

## 2018-09-28 ENCOUNTER — Other Ambulatory Visit: Payer: Self-pay

## 2018-09-28 DIAGNOSIS — F419 Anxiety disorder, unspecified: Secondary | ICD-10-CM

## 2018-09-28 DIAGNOSIS — F332 Major depressive disorder, recurrent severe without psychotic features: Secondary | ICD-10-CM | POA: Diagnosis not present

## 2018-09-28 DIAGNOSIS — L03116 Cellulitis of left lower limb: Secondary | ICD-10-CM

## 2018-09-28 DIAGNOSIS — M4716 Other spondylosis with myelopathy, lumbar region: Secondary | ICD-10-CM | POA: Diagnosis not present

## 2018-09-28 DIAGNOSIS — I890 Lymphedema, not elsewhere classified: Secondary | ICD-10-CM

## 2018-09-28 MED ORDER — CLINDAMYCIN HCL 300 MG PO CAPS: 300.0000 mg | ORAL_CAPSULE | Freq: Three times a day (TID) | ORAL | 1 refills | Status: DC

## 2018-09-28 MED ORDER — SERTRALINE HCL 100 MG PO TABS: 100.0000 mg | ORAL_TABLET | Freq: Every day | ORAL | 5 refills | Status: DC

## 2018-09-30 ENCOUNTER — Encounter: Payer: Self-pay | Admitting: Physician Assistant

## 2018-09-30 NOTE — Progress Notes (Signed)
Telephone visit  Subjective: CC: Legs burning PCP: Terald Sleeper, PA-C [id number] [place] is a 73 y.o. female calls for telephone consult today. Patient provides verbal consent for consult held via phone.  Patient is identified with 2 separate identifiers.  At this time the entire area is on COVID-19 social distancing and stay home orders are in place.  Patient is of higher risk and therefore we are performing this by a virtual method.  Location of patient: Home Location of provider: [facility] Others present for call: No  The patient reports that she is had the feeling of her veins burning in her lower legs.  She has had ongoing issues with lymphedema.  She is supposed to be referred by cardiology to a lymphedema clinic/physical therapy.  She states that when she takes a lot of her fluid feels she feels very weak so she will skip a day.  Have encouraged her to at least take a small amount every day to prevent her from getting this swollen.  She said there is an area on her leg about the palm size of her hand that has some swelling and redness to it she has had cellulitis in her legs before.  She had been on an antibiotic in the past because she had a tooth that was infected and she is having a flareup of this again also.  She is also having significant depression with anxiety the Celexa has not been helping so therefore we were changed her back to Zoloft.  She tolerated this medication well in the past.  She is just overly stress related to her health problems, her daughters and the COVID-19 situation.   ROS: Per HPI  Allergies  Allergen Reactions   Celebrex [Celecoxib] Swelling   Keflet [Cephalexin] Swelling   Penicillins Hives and Swelling    DID THE REACTION INVOLVE: Swelling of the face/tongue/throat, SOB, or low BP? Yes-swelling-hives Sudden or severe rash/hives, skin peeling, or the inside of the mouth or nose? Unknown Did it require medical treatment? Unknown When did it last  happen?over 10 years If all above answers are "NO", may proceed with cephalosporin use.    Sulfa Antibiotics Swelling   Kenalog [Triamcinolone Acetonide]     unknown   Lisinopril Other (See Comments)    unknown   Past Medical History:  Diagnosis Date   Allergy    Bronchitis, chronic ([facility])    Chronic anxiety    Complication of anesthesia    hard to wake up   COPD (chronic obstructive pulmonary disease) ([facility])    Depression    Esophageal reflux    Headache(784.0)    Hyperlipidemia    Hypertension    Hypothyroidism    Obesity    Osteoporosis    Overactive bladder    PVC's (premature ventricular contractions)    Thyroid disease    Vitamin D deficiency disease     Current Outpatient Medications:    acetaminophen (TYLENOL) 650 MG CR tablet, Take 650 mg by mouth every 8 (eight) hours as needed for pain., Disp: , Rfl:    ALPRAZolam (XANAX) 0.5 MG tablet, TAKE 1 TABLET 3 TIMES DAILY AS NEEDED FOR ANXIETY, Disp: 90 tablet, Rfl: 5   aspirin 81 MG EC tablet, Take 1 tablet (81 mg total) by mouth daily. Swallow whole., Disp: 30 tablet, Rfl: 12   calcium carbonate (OS-CAL - DOSED IN MG OF ELEMENTAL CALCIUM) 1250 (500 Ca) MG tablet, Take 1 tablet by mouth once a week. , Disp: ,  Rfl:    cholecalciferol (VITAMIN D) 1000 units tablet, Take 1,000 Units by mouth daily., Disp: , Rfl:    clindamycin (CLEOCIN) 300 MG capsule, Take 1 capsule (300 mg total) by mouth 3 (three) times daily., Disp: 30 capsule, Rfl: 1   furosemide (LASIX) 40 MG tablet, TAKE 60 MG (1 AND 1/2 TABLETS) DAILY AS NEEDED FOR WEIGHT GAIN OVER 207LBS, Disp: 90 tablet, Rfl: 3   levothyroxine (SYNTHROID, LEVOTHROID) 50 MCG tablet, TAKE ONE (1) TABLET EACH DAY, Disp: 90 tablet, Rfl: 3   Multiple Vitamin (MULTIVITAMIN) tablet, Take 1 tablet by mouth 2 (two) times a week. , Disp: , Rfl:    omeprazole-sodium bicarbonate (ZEGERID) 40-1100 MG capsule, Take 1 capsule by mouth daily before breakfast.  (Patient taking differently: Take 1 capsule by mouth daily as needed (for indogestion). ), Disp: , Rfl:    potassium chloride SA (K-DUR,KLOR-CON) 20 MEQ tablet, TAKE ONE (1) TABLET EACH DAY, Disp: 90 tablet, Rfl: 2   predniSONE (DELTASONE) 10 MG tablet, Take 1 tablet (10 mg total) by mouth daily with breakfast., Disp: 30 tablet, Rfl: 1   sertraline (ZOLOFT) 100 MG tablet, Take 1 tablet (100 mg total) by mouth daily., Disp: 30 tablet, Rfl: 5   telmisartan (MICARDIS) 40 MG tablet, TAKE ONE (1) TABLET EACH DAY, Disp: 90 tablet, Rfl: 3  Assessment/ Plan: 73 y.o. female   1. Cellulitis of left lower extremity - clindamycin (CLEOCIN) 300 MG capsule; Take 1 capsule (300 mg total) by mouth 3 (three) times daily.  Dispense: 30 capsule; Refill: 1  2. Lymphedema Plan lymphedema clinic, if she is not referred she is to give [place] a call back  3. Lumbar spondylosis with myelopathy Continue with therapy  4. Anxiety - sertraline (ZOLOFT) 100 MG tablet; Take 1 tablet (100 mg total) by mouth daily.  Dispense: 30 tablet; Refill: 5  5. Severe episode of recurrent major depressive disorder, without psychotic features ([place]) - sertraline (ZOLOFT) 100 MG tablet; Take 1 tablet (100 mg total) by mouth daily.  Dispense: 30 tablet; Refill: 5   Start time: 11:10 AM End time: 11:22 AM  Meds ordered this encounter  Medications   clindamycin (CLEOCIN) 300 MG capsule    Sig: Take 1 capsule (300 mg total) by mouth 3 (three) times daily.    Dispense:  30 capsule    Refill:  1    Order Specific Question:   Supervising Provider    Answer:   Janora Norlander [[id number]]   sertraline (ZOLOFT) 100 MG tablet    Sig: Take 1 tablet (100 mg total) by mouth daily.    Dispense:  30 tablet    Refill:  5    Order Specific Question:   Supervising Provider    Answer:   Janora Norlander [[id number]]    Particia Nearing PA-C [place] [phone]

## 2018-10-28 ENCOUNTER — Telehealth: Payer: Self-pay | Admitting: Physician Assistant

## 2018-11-13 DIAGNOSIS — M25561 Pain in right knee: Secondary | ICD-10-CM | POA: Diagnosis not present

## 2018-11-13 DIAGNOSIS — M25562 Pain in left knee: Secondary | ICD-10-CM | POA: Diagnosis not present

## 2018-11-13 DIAGNOSIS — M170 Bilateral primary osteoarthritis of knee: Secondary | ICD-10-CM | POA: Diagnosis not present

## 2018-11-16 ENCOUNTER — Ambulatory Visit: Payer: Medicare Other | Admitting: Cardiology

## 2018-11-16 NOTE — Progress Notes (Deleted)
Clinical Summary [name] is a 73 y.o.female  1. Leg edema/Chronic diastolic HF - recent leg edema and weight loss. Started on lasix by pcp -11/2015 LE venous US [place]: no DVT   09/2017 echo LVEF 77-41%, no WMAs,Diastolic function is abnormal, indeterminant grade  - increased leg swelling. Seen in ER 05/19/09 - takes lasix 40mg  daily, did not control swelling.  - has increased to 60mg  daily. Has not started KCl.  - increased lasix to 80mg  daily. Weights began to trend down  Weight down from 228 lbs in 04/2018 to 207 lbs.  - May 28, 2018 labs Cr 1.29 BUN 22 (prior Cr 1.04 and BUN 17)    - last visit in Feb 2020 we lowered her lasix to 80mg  alternative with 60mg , referred to lymphedema clinic. Weight was down from 228 to 207 lbs at that visit - he labs after that appt showed Cr up from 1.04 to 2.64. Lasix was held a few days then resumed 80mg  alternating with 60mg .  - repeat labs 3/13 showd Cr down to 1.29.    - still with some swelling in legs. Weights down 203 lbs.  - she cancelled lymphedema clinic due to scheduling issues - some recent dizziness, feeling fatigued. .    - last visit we changed her lasix to 60mg  prn weight above 207 lbs due to concern she was becoming hypovolemic.    Past Medical History:  Diagnosis Date  . Allergy   . Bronchitis, chronic ([place])   . Chronic anxiety   . Complication of anesthesia    hard to wake up  . COPD (chronic obstructive pulmonary disease) ([place])   . Depression   . Esophageal reflux   . Headache(784.0)   . Hyperlipidemia   . Hypertension   . Hypothyroidism   . Obesity   . Osteoporosis   . Overactive bladder   . PVC's (premature ventricular contractions)   . Thyroid disease   . Vitamin D deficiency disease      Allergies  Allergen Reactions  . Celebrex [Celecoxib] Swelling  . Keflet [Cephalexin] Swelling  . Penicillins Hives and Swelling    DID THE REACTION INVOLVE: Swelling of the  face/tongue/throat, SOB, or low BP? Yes-swelling-hives Sudden or severe rash/hives, skin peeling, or the inside of the mouth or nose? Unknown Did it require medical treatment? Unknown When did it last happen?over 10 years If all above answers are "NO", may proceed with cephalosporin use.   . Sulfa Antibiotics Swelling  . Kenalog [Triamcinolone Acetonide]     unknown  . Lisinopril Other (See Comments)    unknown     Current Outpatient Medications  Medication Sig Dispense Refill  . acetaminophen (TYLENOL) 650 MG CR tablet Take 650 mg by mouth every 8 (eight) hours as needed for pain.    Marland Kitchen ALPRAZolam (XANAX) 0.5 MG tablet TAKE 1 TABLET 3 TIMES DAILY AS NEEDED FOR ANXIETY 90 tablet 5  . aspirin 81 MG EC tablet Take 1 tablet (81 mg total) by mouth daily. Swallow whole. 30 tablet 12  . calcium carbonate (OS-CAL - DOSED IN MG OF ELEMENTAL CALCIUM) 1250 (500 Ca) MG tablet Take 1 tablet by mouth once a week.     . cholecalciferol (VITAMIN D) 1000 units tablet Take 1,000 Units by mouth daily.    . clindamycin (CLEOCIN) 300 MG capsule Take 1 capsule (300 mg total) by mouth 3 (three) times daily. 30 capsule 1  . furosemide (LASIX) 40 MG tablet TAKE 60 MG (  1 AND 1/2 TABLETS) DAILY AS NEEDED FOR WEIGHT GAIN OVER 207LBS 90 tablet 3  . levothyroxine (SYNTHROID, LEVOTHROID) 50 MCG tablet TAKE ONE (1) TABLET EACH DAY 90 tablet 3  . Multiple Vitamin (MULTIVITAMIN) tablet Take 1 tablet by mouth 2 (two) times a week.     Marland Kitchen omeprazole-sodium bicarbonate (ZEGERID) 40-1100 MG capsule Take 1 capsule by mouth daily before breakfast. (Patient taking differently: Take 1 capsule by mouth daily as needed (for indogestion). )    . potassium chloride SA (K-DUR,KLOR-CON) 20 MEQ tablet TAKE ONE (1) TABLET EACH DAY 90 tablet 2  . predniSONE (DELTASONE) 10 MG tablet Take 1 tablet (10 mg total) by mouth daily with breakfast. 30 tablet 1  . sertraline (ZOLOFT) 100 MG tablet Take 1 tablet (100 mg total) by mouth daily.  30 tablet 5  . telmisartan (MICARDIS) 40 MG tablet TAKE ONE (1) TABLET EACH DAY 90 tablet 3   No current facility-administered medications for this visit.      Past Surgical History:  Procedure Laterality Date  . ABDOMINAL HYSTERECTOMY    . CHOLECYSTECTOMY N/A 11/04/2013   Procedure: LAPAROSCOPIC CHOLECYSTECTOMY WITH INTRAOPERATIVE CHOLANGIOGRAM;  Surgeon: Imogene Burn. Georgette Dover, MD;  Location: [facility];  Service: General;  Laterality: N/A;  . COLONOSCOPY    . FRACTURE SURGERY     pins removed from Hip surgery  . HIP FRACTURE SURGERY  1990    pins removed in 1991  . PARTIAL HYSTERECTOMY  1987  . SPINAL FUSION    . UPPER GASTROINTESTINAL ENDOSCOPY       Allergies  Allergen Reactions  . Celebrex [Celecoxib] Swelling  . Keflet [Cephalexin] Swelling  . Penicillins Hives and Swelling    DID THE REACTION INVOLVE: Swelling of the face/tongue/throat, SOB, or low BP? Yes-swelling-hives Sudden or severe rash/hives, skin peeling, or the inside of the mouth or nose? Unknown Did it require medical treatment? Unknown When did it last happen?over 10 years If all above answers are "NO", may proceed with cephalosporin use.   . Sulfa Antibiotics Swelling  . Kenalog [Triamcinolone Acetonide]     unknown  . Lisinopril Other (See Comments)    unknown      Family History  Problem Relation Age of Onset  . Arthritis Other   . Heart disease Other   . Cancer Other   . Diabetes Other   . OCD Other      Social History [name] reports that she has never smoked. She has never used smokeless tobacco. [name] reports no history of alcohol use.   Review of Systems CONSTITUTIONAL: No weight loss, fever, chills, weakness or fatigue.  HEENT: Eyes: No visual loss, blurred vision, double vision or yellow sclerae.No hearing loss, sneezing, congestion, runny nose or sore throat.  SKIN: No rash or itching.  CARDIOVASCULAR:  RESPIRATORY: No shortness of breath, cough or sputum.   GASTROINTESTINAL: No anorexia, nausea, vomiting or diarrhea. No abdominal pain or blood.  GENITOURINARY: No burning on urination, no polyuria NEUROLOGICAL: No headache, dizziness, syncope, paralysis, ataxia, numbness or tingling in the extremities. No change in bowel or bladder control.  MUSCULOSKELETAL: No muscle, back pain, joint pain or stiffness.  LYMPHATICS: No enlarged nodes. No history of splenectomy.  PSYCHIATRIC: No history of depression or anxiety.  ENDOCRINOLOGIC: No reports of sweating, cold or heat intolerance. No polyuria or polydipsia.  Marland Kitchen   Physical Examination There were no vitals filed for this visit. There were no vitals filed for this visit.  Gen: resting comfortably, no  acute distress HEENT: no scleral icterus, pupils equal round and reactive, no palptable cervical adenopathy,  CV Resp: Clear to auscultation bilaterally GI: abdomen is soft, non-tender, non-distended, normal bowel sounds, no hepatosplenomegaly MSK: extremities are warm, no edema.  Skin: warm, no rash Neuro:  no focal deficits Psych: appropriate affect   Diagnostic Studies 09/2017 echo Study Conclusions  - Left ventricle: The cavity size was normal. Wall thickness was increased in a pattern of mild LVH. Systolic function was normal. The estimated ejection fraction was in the range of 60% to 65%. Wall motion was normal; there were no regional wall motion abnormalities. - Aortic valve: Mildly calcified annulus. Trileaflet; mildly thickened leaflets. Mean gradient (S): 8 mm Hg. Valve area (VTI): 2.13 cm^2. Valve area (Vmax): 1.86 cm^2. Valve area (Vmean): 2.05 cm^2. - Left atrium: The atrium was severely dilated. - Right atrium: The atrium was mildly dilated. - Technically adequate study.    Assessment and Plan  1. Leg edema/Chronic diastolic HF - supsect combined diastolic HF and lymphedema - I think she has significantly diuresed from a CHF standpoint, signifnant bump in  Cr few months ago would support that, I think her ongoing edema is her lymphedema. She did lose over 20 lbs with diuresis - with ongoing weight loss on current diuretic worried she is getting dry again, change lasix to 60mg  prn weight above 207 lbs  - not able to make lymphedema clinic appts at this time due to family constraints but also COVID-19, reassses potential next visit, she would favor the [facility] clinic      Arnoldo Lenis, M.D., F.A.C.C.

## 2018-11-20 ENCOUNTER — Other Ambulatory Visit: Payer: Self-pay | Admitting: Physician Assistant

## 2018-11-20 DIAGNOSIS — L03116 Cellulitis of left lower limb: Secondary | ICD-10-CM

## 2018-11-25 ENCOUNTER — Other Ambulatory Visit: Payer: Self-pay | Admitting: Physician Assistant

## 2018-11-25 ENCOUNTER — Ambulatory Visit (INDEPENDENT_AMBULATORY_CARE_PROVIDER_SITE_OTHER): Payer: Medicare Other | Admitting: Physician Assistant

## 2018-11-25 ENCOUNTER — Other Ambulatory Visit: Payer: Self-pay

## 2018-11-25 DIAGNOSIS — F419 Anxiety disorder, unspecified: Secondary | ICD-10-CM | POA: Diagnosis not present

## 2018-11-25 DIAGNOSIS — L2084 Intrinsic (allergic) eczema: Secondary | ICD-10-CM | POA: Diagnosis not present

## 2018-11-25 MED ORDER — CLOBETASOL PROP EMOLLIENT BASE 0.05 % EX CREA: 1.0000 "application " | TOPICAL_CREAM | Freq: Two times a day (BID) | CUTANEOUS | 5 refills | Status: DC

## 2018-11-25 MED ORDER — BUSPIRONE HCL 5 MG PO TABS: 5.0000 mg | ORAL_TABLET | Freq: Three times a day (TID) | ORAL | 2 refills | Status: DC

## 2018-11-25 NOTE — Telephone Encounter (Signed)
Please advise.  Appt today 7/1

## 2018-11-25 NOTE — Progress Notes (Signed)
Telephone visit  Subjective: VO:ZDGU PCP: Terald Sleeper, PA-C [id number] Knoop is a 73 y.o. female calls for telephone consult today. Patient provides verbal consent for consult held via phone.  Patient is identified with 2 separate identifiers.  At this time the entire area is on COVID-19 social distancing and stay home orders are in place.  Patient is of higher risk and therefore we are performing this by a virtual method.  Location of patient: home Location of provider: [facility] Others present for call: no   Patient states that she has had a significant amount of eczema on her hands.  It is probably from washing them so much more with COVID-19 restrictions.  She has used steroid cream in the past and would like to have it refilled.  Occasionally she has taken prednisone in the past to try to get it to calm down also.  And she states that her anxiety is much more severe at this time.  She is on an SSRI but she is having persistent anxiety.  She still has apparent that she goes and checks on almost every day and a daughter in the home with a severe psychiatric disability.   ROS: Per HPI  Allergies  Allergen Reactions  . Celebrex [Celecoxib] Swelling  . Keflet [Cephalexin] Swelling  . Penicillins Hives and Swelling    DID THE REACTION INVOLVE: Swelling of the face/tongue/throat, SOB, or low BP? Yes-swelling-hives Sudden or severe rash/hives, skin peeling, or the inside of the mouth or nose? Unknown Did it require medical treatment? Unknown When did it last happen?over 10 years If all above answers are "NO", may proceed with cephalosporin use.   . Sulfa Antibiotics Swelling  . Kenalog [Triamcinolone Acetonide]     unknown  . Lisinopril Other (See Comments)    unknown   Past Medical History:  Diagnosis Date  . Allergy   . Bronchitis, chronic ([place])   . Chronic anxiety   . Complication of anesthesia    hard to wake up  . COPD (chronic obstructive pulmonary disease)  ([place])   . Depression   . Esophageal reflux   . Headache(784.0)   . Hyperlipidemia   . Hypertension   . Hypothyroidism   . Obesity   . Osteoporosis   . Overactive bladder   . PVC's (premature ventricular contractions)   . Thyroid disease   . Vitamin D deficiency disease     Current Outpatient Medications:  .  acetaminophen (TYLENOL) 650 MG CR tablet, Take 650 mg by mouth every 8 (eight) hours as needed for pain., Disp: , Rfl:  .  ALPRAZolam (XANAX) 0.5 MG tablet, TAKE 1 TABLET 3 TIMES DAILY AS NEEDED FOR ANXIETY, Disp: 90 tablet, Rfl: 5 .  aspirin 81 MG EC tablet, Take 1 tablet (81 mg total) by mouth daily. Swallow whole., Disp: 30 tablet, Rfl: 12 .  busPIRone (BUSPAR) 5 MG tablet, Take 1 tablet (5 mg total) by mouth 3 (three) times daily. For anxiety, Disp: 90 tablet, Rfl: 2 .  calcium carbonate (OS-CAL - DOSED IN MG OF ELEMENTAL CALCIUM) 1250 (500 Ca) MG tablet, Take 1 tablet by mouth once a week. , Disp: , Rfl:  .  cholecalciferol (VITAMIN D) 1000 units tablet, Take 1,000 Units by mouth daily., Disp: , Rfl:  .  clindamycin (CLEOCIN) 300 MG capsule, Take 1 capsule (300 mg total) by mouth 3 (three) times daily., Disp: 30 capsule, Rfl: 1 .  Clobetasol Prop Emollient Base 0.05 % emollient cream,  Apply 1 application topically 2 (two) times daily., Disp: 60 g, Rfl: 5 .  furosemide (LASIX) 40 MG tablet, TAKE 60 MG (1 AND 1/2 TABLETS) DAILY AS NEEDED FOR WEIGHT GAIN OVER 207LBS, Disp: 90 tablet, Rfl: 3 .  levothyroxine (SYNTHROID, LEVOTHROID) 50 MCG tablet, TAKE ONE (1) TABLET EACH DAY, Disp: 90 tablet, Rfl: 3 .  Multiple Vitamin (MULTIVITAMIN) tablet, Take 1 tablet by mouth 2 (two) times a week. , Disp: , Rfl:  .  omeprazole-sodium bicarbonate (ZEGERID) 40-1100 MG capsule, Take 1 capsule by mouth daily before breakfast. (Patient taking differently: Take 1 capsule by mouth daily as needed (for indogestion). ), Disp: , Rfl:  .  potassium chloride SA (K-DUR,KLOR-CON) 20 MEQ tablet, TAKE ONE (1)  TABLET EACH DAY, Disp: 90 tablet, Rfl: 2 .  predniSONE (DELTASONE) 10 MG tablet, TAKE 1 TABLET EVERY MORNING WITH BREAKFAST, Disp: 30 tablet, Rfl: 1 .  sertraline (ZOLOFT) 100 MG tablet, Take 1 tablet (100 mg total) by mouth daily., Disp: 30 tablet, Rfl: 5 .  telmisartan (MICARDIS) 40 MG tablet, TAKE ONE (1) TABLET EACH DAY, Disp: 90 tablet, Rfl: 3  Assessment/ Plan: 73 y.o. female   1. Intrinsic eczema - Clobetasol Prop Emollient Base 0.05 % emollient cream; Apply 1 application topically 2 (two) times daily.  Dispense: 60 g; Refill: 5  2. Anxiety - busPIRone (BUSPAR) 5 MG tablet; Take 1 tablet (5 mg total) by mouth 3 (three) times daily. For anxiety  Dispense: 90 tablet; Refill: 2   Continue all other maintenance medications as listed above.  Start time: 3:15 PM End time: 3:21 PM  Meds ordered this encounter  Medications  . Clobetasol Prop Emollient Base 0.05 % emollient cream    Sig: Apply 1 application topically 2 (two) times daily.    Dispense:  60 g    Refill:  5    Order Specific Question:   Supervising Provider    Answer:   Janora Norlander [[id number]]  . busPIRone (BUSPAR) 5 MG tablet    Sig: Take 1 tablet (5 mg total) by mouth 3 (three) times daily. For anxiety    Dispense:  90 tablet    Refill:  2    Order Specific Question:   Supervising Provider    Answer:   Janora Norlander [[id number]]    Particia Nearing PA-C [place] [phone]

## 2018-11-26 ENCOUNTER — Encounter: Payer: Self-pay | Admitting: Physician Assistant

## 2018-12-22 ENCOUNTER — Telehealth: Payer: Self-pay | Admitting: Physician Assistant

## 2018-12-28 ENCOUNTER — Other Ambulatory Visit: Payer: Self-pay | Admitting: Physician Assistant

## 2018-12-28 MED ORDER — PREDNISONE 10 MG (48) PO TBPK: ORAL_TABLET | ORAL | 0 refills | Status: DC

## 2018-12-28 NOTE — Telephone Encounter (Signed)
Sent a Sterapred 12-day Dosepak to the pharmacy.

## 2018-12-28 NOTE — Telephone Encounter (Signed)
Patient aware.

## 2018-12-28 NOTE — Telephone Encounter (Signed)
Patient states that she is having an allergic reaction to latex gloves for 2 weeks, no relief from benadryl and clobetasol cream.  Patient would like something different to be sent into pharmacy

## 2019-01-27 ENCOUNTER — Telehealth: Payer: Self-pay | Admitting: Cardiology

## 2019-01-27 MED ORDER — FUROSEMIDE 40 MG PO TABS: ORAL_TABLET | ORAL | 3 refills | Status: DC

## 2019-01-27 NOTE — Telephone Encounter (Signed)
Patient almost passed out yesterday BP was 110/48 pulse was 62   Stated her BP 137/84 pulse 65  Feels she is dehydrated

## 2019-01-27 NOTE — Telephone Encounter (Signed)
Patient informed and verbalized understanding of plan. 

## 2019-01-27 NOTE — Telephone Encounter (Signed)
Yesterday afternoon while cooking, became weak and almost passed out, pale looking per family BP 110/48 & HR unsure of heart rate. Says she sat down for half an hour. Also reports having diarrhea after dizziness. Reports eating fried okra prior to getting dizzy and felt indigestion. Says she always get indigestion when eating fried foods. Says she drank water and normal skin color returned and also took an additional baby aspirin. Denied chest pain or sob, NV. Admits to not drinking enough water. Today BP 131/84 & HR 65. Weighed 203 lbs on yesterday. Reports feeling better today. Advised to Increase fluid intake, check weights daily and continue monitoring symptoms. Advised to call back if symptoms return. Missed last OV on 11/16/2018. Appointment rescheduled to 03/31/2019. Medications reconciled. Reports taking furosemide 60 mg alternating with 80 mg daily.

## 2019-01-27 NOTE — Telephone Encounter (Signed)
From our last note we had planned to change her lasix to 60mg  just prn weight above 207 lbs based on notes, she was starting to feel dry around that time. I would make this change to her lasix and see how she does   Zandra Abts MD

## 2019-02-02 ENCOUNTER — Telehealth: Payer: Self-pay | Admitting: Physician Assistant

## 2019-02-02 ENCOUNTER — Other Ambulatory Visit: Payer: Self-pay | Admitting: Physician Assistant

## 2019-02-02 DIAGNOSIS — L03116 Cellulitis of left lower limb: Secondary | ICD-10-CM

## 2019-02-02 NOTE — Telephone Encounter (Signed)
Called pt - states: Has edema / lymphedema - legs swell a lot  Often has redness. The last time got Clindamycin - it is red again and not real Hot to touch, some tingle and burning sensation.  [facility] [place]

## 2019-02-02 NOTE — Telephone Encounter (Signed)
A request for this medication had already been sent over and I approved it earlier.

## 2019-02-02 NOTE — Telephone Encounter (Signed)
Pt aware - med at [facility]

## 2019-02-03 ENCOUNTER — Telehealth: Payer: Self-pay | Admitting: Physician Assistant

## 2019-02-03 ENCOUNTER — Other Ambulatory Visit: Payer: Self-pay | Admitting: Physician Assistant

## 2019-02-03 DIAGNOSIS — E7800 Pure hypercholesterolemia, unspecified: Secondary | ICD-10-CM

## 2019-02-03 DIAGNOSIS — I10 Essential (primary) hypertension: Secondary | ICD-10-CM

## 2019-02-03 DIAGNOSIS — M4716 Other spondylosis with myelopathy, lumbar region: Secondary | ICD-10-CM

## 2019-02-03 DIAGNOSIS — F419 Anxiety disorder, unspecified: Secondary | ICD-10-CM

## 2019-02-03 DIAGNOSIS — E039 Hypothyroidism, unspecified: Secondary | ICD-10-CM

## 2019-02-03 NOTE — Telephone Encounter (Signed)
Orders placed and will need to give urine for kidney tests and for yearly narcotic drug screen (her anxiety pills).  Can schedule a follow up after that

## 2019-02-03 NOTE — Telephone Encounter (Signed)
Patient aware.

## 2019-02-04 ENCOUNTER — Ambulatory Visit (INDEPENDENT_AMBULATORY_CARE_PROVIDER_SITE_OTHER): Payer: Medicare Other | Admitting: Physician Assistant

## 2019-02-04 ENCOUNTER — Other Ambulatory Visit: Payer: Self-pay

## 2019-02-04 ENCOUNTER — Encounter: Payer: Self-pay | Admitting: Physician Assistant

## 2019-02-04 VITALS — BP 108/67 | HR 64 | Temp 98.9°F | Ht 64.0 in | Wt 205.4 lb

## 2019-02-04 DIAGNOSIS — L03116 Cellulitis of left lower limb: Secondary | ICD-10-CM | POA: Diagnosis not present

## 2019-02-04 DIAGNOSIS — I10 Essential (primary) hypertension: Secondary | ICD-10-CM

## 2019-02-04 MED ORDER — CLINDAMYCIN HCL 300 MG PO CAPS: ORAL_CAPSULE | ORAL | 5 refills | Status: DC

## 2019-02-05 ENCOUNTER — Ambulatory Visit: Payer: Medicare Other | Admitting: Family Medicine

## 2019-02-05 ENCOUNTER — Other Ambulatory Visit: Payer: Self-pay | Admitting: Physician Assistant

## 2019-02-05 DIAGNOSIS — N189 Chronic kidney disease, unspecified: Secondary | ICD-10-CM

## 2019-02-05 LAB — CMP14+EGFR
ALT: 12 IU/L (ref 0–32)
AST: 19 IU/L (ref 0–40)
Albumin/Globulin Ratio: 1.7 (ref 1.2–2.2)
Albumin: 4 g/dL (ref 3.7–4.7)
Alkaline Phosphatase: 126 IU/L — ABNORMAL HIGH (ref 39–117)
BUN/Creatinine Ratio: 17 (ref 12–28)
BUN: 27 mg/dL (ref 8–27)
Bilirubin Total: 0.4 mg/dL (ref 0.0–1.2)
CO2: 24 mmol/L (ref 20–29)
Calcium: 9.3 mg/dL (ref 8.7–10.3)
Chloride: 98 mmol/L (ref 96–106)
Creatinine, Ser: 1.59 mg/dL — ABNORMAL HIGH (ref 0.57–1.00)
GFR calc Af Amer: 37 mL/min/{1.73_m2} — ABNORMAL LOW (ref >59)
GFR calc non Af Amer: 32 mL/min/{1.73_m2} — ABNORMAL LOW (ref >59)
Globulin, Total: 2.4 g/dL (ref 1.5–4.5)
Glucose: 115 mg/dL — ABNORMAL HIGH (ref 65–99)
Potassium: 4.1 mmol/L (ref 3.5–5.2)
Sodium: 137 mmol/L (ref 134–144)
Total Protein: 6.4 g/dL (ref 6.0–8.5)

## 2019-02-05 LAB — CBC WITH DIFFERENTIAL/PLATELET
Basophils Absolute: 0 10*3/uL (ref 0.0–0.2)
Basos: 1 %
EOS (ABSOLUTE): 0.1 10*3/uL (ref 0.0–0.4)
Eos: 1 %
Hematocrit: 33.2 % — ABNORMAL LOW (ref 34.0–46.6)
Hemoglobin: 11.3 g/dL (ref 11.1–15.9)
Immature Grans (Abs): 0 10*3/uL (ref 0.0–0.1)
Immature Granulocytes: 0 %
Lymphocytes Absolute: 1.7 10*3/uL (ref 0.7–3.1)
Lymphs: 23 %
MCH: 29.9 pg (ref 26.6–33.0)
MCHC: 34 g/dL (ref 31.5–35.7)
MCV: 88 fL (ref 79–97)
Monocytes Absolute: 0.6 10*3/uL (ref 0.1–0.9)
Monocytes: 8 %
Neutrophils Absolute: 4.8 10*3/uL (ref 1.4–7.0)
Neutrophils: 67 %
Platelets: 338 10*3/uL (ref 150–450)
RBC: 3.78 x10E6/uL (ref 3.77–5.28)
RDW: 13.1 % (ref 11.7–15.4)
WBC: 7.3 10*3/uL (ref 3.4–10.8)

## 2019-02-10 NOTE — Progress Notes (Signed)
BP 108/67   Pulse 64   Temp 98.9 F (37.2 C) (Temporal)   Ht 5' 4" (1.626 m)   Wt 205 lb 6.4 oz (93.2 kg)   SpO2 99%   BMI 35.26 kg/m    Subjective:    Patient ID: [name], female    DOB: April 09, 1946, 73 y.o.   MRN: [id number]  HPI: [name] is a 74 y.o. female presenting on 02/04/2019 for legs red  This patient comes in for having increased redness in her lower legs.  The right leg is greater than the left.  She has had a history of cellulitis in her legs.  And she does have chronic edema.  We have discussed still trying to wear compression garments if at all possible.  I knew that they do hurt.  But it will greatly help with the edema that she accumulates.  She denies any fever or chills at this time.  Overall her blood pressure is doing very well.  All of her medications are reviewed.  She still lives at home with an adult daughter that has severe mental illness, severe [place] phobia and OCD.  The patient will not do anything in the normal realm of activities of daily living, bathing, brushing hair, sleeping nights and await days, and we will not pursue treatment because she is fearful of coming out of her house.  I have talked with Para March on several occasions about getting her inpatient treatment and that means that she would have to call to have her committed or call Adult Protective Services to see what they can do to help.  But at this time no one is going out to houses to do therapy or counseling.  The patient is also having to care for her older mother with health issues.  Past Medical History:  Diagnosis Date  . Allergy   . Bronchitis, chronic ([place])   . Chronic anxiety   . Complication of anesthesia    hard to wake up  . COPD (chronic obstructive pulmonary disease) ([place])   . Depression   . Esophageal reflux   . Headache(784.0)   . Hyperlipidemia   . Hypertension   . Hypothyroidism   . Obesity   . Osteoporosis   . Overactive bladder   . PVC's (premature  ventricular contractions)   . Thyroid disease   . Vitamin D deficiency disease    Relevant past medical, surgical, family and social history reviewed and updated as indicated. Interim medical history since our last visit reviewed. Allergies and medications reviewed and updated. DATA REVIEWED: CHART IN EPIC  Family History reviewed for pertinent findings.  Review of Systems  Constitutional: Negative.   HENT: Negative.   Eyes: Negative.   Respiratory: Negative.   Cardiovascular: Positive for leg swelling. Negative for chest pain and palpitations.  Gastrointestinal: Negative.   Genitourinary: Negative.   Skin: Positive for color change.    Allergies as of 02/04/2019      Reactions   Celebrex [celecoxib] Swelling   Keflet [cephalexin] Swelling   Penicillins Hives, Swelling   DID THE REACTION INVOLVE: Swelling of the face/tongue/throat, SOB, or low BP? Yes-swelling-hives Sudden or severe rash/hives, skin peeling, or the inside of the mouth or nose? Unknown Did it require medical treatment? Unknown When did it last happen?over 10 years If all above answers are "NO", may proceed with cephalosporin use.   Sulfa Antibiotics Swelling   Kenalog [triamcinolone Acetonide]    unknown   Latex    Lisinopril  Other (See Comments)   unknown      Medication List       Accurate as of February 04, 2019 11:59 PM. If you have any questions, ask your nurse or doctor.        STOP taking these medications   predniSONE 10 MG (48) Tbpk tablet Commonly known as: STERAPRED UNI-PAK 48 TAB Stopped by: Terald Sleeper, PA-C     TAKE these medications   acetaminophen 650 MG CR tablet Commonly known as: TYLENOL Take 650 mg by mouth every 8 (eight) hours as needed for pain.   ALPRAZolam 0.5 MG tablet Commonly known as: XANAX TAKE 1 TABLET 3 TIMES DAILY AS NEEDED FOR ANXIETY   aspirin 81 MG EC tablet Take 1 tablet (81 mg total) by mouth daily. Swallow whole.   busPIRone 5 MG tablet  Commonly known as: BUSPAR Take 1 tablet (5 mg total) by mouth 3 (three) times daily. For anxiety   calcium carbonate 1250 (500 Ca) MG tablet Commonly known as: OS-CAL - dosed in mg of elemental calcium Take 1 tablet by mouth once a week.   cholecalciferol 1000 units tablet Commonly known as: VITAMIN D Take 1,000 Units by mouth daily.   clindamycin 300 MG capsule Commonly known as: CLEOCIN TAKE ONE (1) CAPSULE THREE (3) TIMES EACH DAY   Clobetasol Prop Emollient Base 0.05 % emollient cream Apply 1 application topically 2 (two) times daily.   furosemide 40 MG tablet Commonly known as: LASIX TAKE 60 MG (1 AND 1/2 TABLETS) DAILY AS NEEDED FOR WEIGHT GAIN OVER 207 LBS   levothyroxine 50 MCG tablet Commonly known as: SYNTHROID TAKE ONE (1) TABLET EACH DAY   multivitamin tablet Take 1 tablet by mouth 2 (two) times a week.   omeprazole-sodium bicarbonate 40-1100 MG capsule Commonly known as: Zegerid Take 1 capsule by mouth daily before breakfast. What changed:   when to take this  reasons to take this   potassium chloride SA 20 MEQ tablet Commonly known as: K-DUR TAKE ONE (1) TABLET EACH DAY   sertraline 100 MG tablet Commonly known as: Zoloft Take 1 tablet (100 mg total) by mouth daily.   telmisartan 40 MG tablet Commonly known as: MICARDIS TAKE ONE (1) TABLET EACH DAY          Objective:    BP 108/67   Pulse 64   Temp 98.9 F (37.2 C) (Temporal)   Ht 5' 4" (1.626 m)   Wt 205 lb 6.4 oz (93.2 kg)   SpO2 99%   BMI 35.26 kg/m   Allergies  Allergen Reactions  . Celebrex [Celecoxib] Swelling  . Keflet [Cephalexin] Swelling  . Penicillins Hives and Swelling    DID THE REACTION INVOLVE: Swelling of the face/tongue/throat, SOB, or low BP? Yes-swelling-hives Sudden or severe rash/hives, skin peeling, or the inside of the mouth or nose? Unknown Did it require medical treatment? Unknown When did it last happen?over 10 years If all above answers are "NO",  may proceed with cephalosporin use.   . Sulfa Antibiotics Swelling  . Kenalog [Triamcinolone Acetonide]     unknown  . Latex   . Lisinopril Other (See Comments)    unknown    Wt Readings from Last 3 Encounters:  02/04/19 205 lb 6.4 oz (93.2 kg)  09/01/18 203 lb (92.1 kg)  08/07/18 207 lb (93.9 kg)    Physical Exam Constitutional:      General: She is not in acute distress.    Appearance: Normal appearance. She  is well-developed.  HENT:     Head: Normocephalic and atraumatic.  Cardiovascular:     Rate and Rhythm: Normal rate.  Pulmonary:     Effort: Pulmonary effort is normal.  Skin:    General: Skin is warm and dry.     Findings: Erythema present. No rash.          Comments: Blotchy redness on the right lateral leg, no skin breakdown, no ulcer.  Definite swelling in the right and left leg.  Right greater than left.  Neurological:     Mental Status: She is alert and oriented to person, place, and time.     Deep Tendon Reflexes: Reflexes are normal and symmetric.     Results for orders placed or performed in visit on 02/04/19  CBC with Differential/Platelet  Result Value Ref Range   WBC 7.3 3.4 - 10.8 x10E3/uL   RBC 3.78 3.77 - 5.28 x10E6/uL   Hemoglobin 11.3 11.1 - 15.9 g/dL   Hematocrit 33.2 (L) 34.0 - 46.6 %   MCV 88 79 - 97 fL   MCH 29.9 26.6 - 33.0 pg   MCHC 34.0 31.5 - 35.7 g/dL   RDW 13.1 11.7 - 15.4 %   Platelets 338 150 - 450 x10E3/uL   Neutrophils 67 Not Estab. %   Lymphs 23 Not Estab. %   Monocytes 8 Not Estab. %   Eos 1 Not Estab. %   Basos 1 Not Estab. %   Neutrophils Absolute 4.8 1.4 - 7.0 x10E3/uL   Lymphocytes Absolute 1.7 0.7 - 3.1 x10E3/uL   Monocytes Absolute 0.6 0.1 - 0.9 x10E3/uL   EOS (ABSOLUTE) 0.1 0.0 - 0.4 x10E3/uL   Basophils Absolute 0.0 0.0 - 0.2 x10E3/uL   Immature Granulocytes 0 Not Estab. %   Immature Grans (Abs) 0.0 0.0 - 0.1 x10E3/uL  CMP14+EGFR  Result Value Ref Range   Glucose 115 (H) 65 - 99 mg/dL   BUN 27 8 - 27 mg/dL    Creatinine, Ser 1.59 (H) 0.57 - 1.00 mg/dL   GFR calc non Af Amer 32 (L) >59 mL/min/1.73   GFR calc Af Amer 37 (L) >59 mL/min/1.73   BUN/Creatinine Ratio 17 12 - 28   Sodium 137 134 - 144 mmol/L   Potassium 4.1 3.5 - 5.2 mmol/L   Chloride 98 96 - 106 mmol/L   CO2 24 20 - 29 mmol/L   Calcium 9.3 8.7 - 10.3 mg/dL   Total Protein 6.4 6.0 - 8.5 g/dL   Albumin 4.0 3.7 - 4.7 g/dL   Globulin, Total 2.4 1.5 - 4.5 g/dL   Albumin/Globulin Ratio 1.7 1.2 - 2.2   Bilirubin Total 0.4 0.0 - 1.2 mg/dL   Alkaline Phosphatase 126 (H) 39 - 117 IU/L   AST 19 0 - 40 IU/L   ALT 12 0 - 32 IU/L      Assessment & Plan:   1. Essential hypertension - CBC with Differential/Platelet - CMP14+EGFR  2. Cellulitis of left lower extremity - clindamycin (CLEOCIN) 300 MG capsule; TAKE ONE (1) CAPSULE THREE (3) TIMES EACH DAY  Dispense: 30 capsule; Refill: 5   Continue all other maintenance medications as listed above.  Follow up plan: No follow-ups on file.  Educational handout given for [place] PA-C [place] [address]  [place], [place] [address] [phone]   02/10/2019, 10:05 AM

## 2019-02-17 DIAGNOSIS — M170 Bilateral primary osteoarthritis of knee: Secondary | ICD-10-CM | POA: Diagnosis not present

## 2019-03-01 ENCOUNTER — Other Ambulatory Visit: Payer: Self-pay | Admitting: Physician Assistant

## 2019-03-01 DIAGNOSIS — F419 Anxiety disorder, unspecified: Secondary | ICD-10-CM

## 2019-03-02 ENCOUNTER — Telehealth: Payer: Self-pay | Admitting: Physician Assistant

## 2019-03-02 ENCOUNTER — Other Ambulatory Visit: Payer: Self-pay | Admitting: Physician Assistant

## 2019-03-02 DIAGNOSIS — F419 Anxiety disorder, unspecified: Secondary | ICD-10-CM

## 2019-03-02 MED ORDER — ALPRAZOLAM 0.5 MG PO TABS: ORAL_TABLET | ORAL | 5 refills | Status: DC

## 2019-03-02 NOTE — Telephone Encounter (Signed)
Patient aware.

## 2019-03-02 NOTE — Telephone Encounter (Signed)
Patient is requesting a xanax refill. Last seen 02/04/19 Rx was sent 08/07/18 #90 R-5 Please advise

## 2019-03-02 NOTE — Telephone Encounter (Signed)
ordered

## 2019-03-09 DIAGNOSIS — M170 Bilateral primary osteoarthritis of knee: Secondary | ICD-10-CM | POA: Diagnosis not present

## 2019-03-10 DIAGNOSIS — M170 Bilateral primary osteoarthritis of knee: Secondary | ICD-10-CM | POA: Diagnosis not present

## 2019-03-31 ENCOUNTER — Telehealth (INDEPENDENT_AMBULATORY_CARE_PROVIDER_SITE_OTHER): Payer: Medicare Other | Admitting: Cardiology

## 2019-03-31 ENCOUNTER — Encounter: Payer: Self-pay | Admitting: Cardiology

## 2019-03-31 VITALS — Ht 64.0 in | Wt 209.0 lb

## 2019-03-31 DIAGNOSIS — R600 Localized edema: Secondary | ICD-10-CM | POA: Diagnosis not present

## 2019-03-31 DIAGNOSIS — I5032 Chronic diastolic (congestive) heart failure: Secondary | ICD-10-CM

## 2019-03-31 MED ORDER — POTASSIUM CHLORIDE CRYS ER 20 MEQ PO TBCR: EXTENDED_RELEASE_TABLET | ORAL | 3 refills | Status: DC

## 2019-03-31 NOTE — Patient Instructions (Signed)
Your physician recommends that you schedule a follow-up appointment in: [place]  Your physician recommends that you continue on your current medications as directed. Please refer to the Current Medication list given to you today.  WE HAVE REFILLED POTASSIUM  Your physician recommends that you return for lab work BMP/MG  Thank you for choosing [facility]!!

## 2019-03-31 NOTE — Addendum Note (Signed)
Addended by: Julian Hy T on: 03/31/2019 02:30 PM   Modules accepted: Orders

## 2019-03-31 NOTE — Progress Notes (Signed)
Virtual Visit via Telephone Note   This visit type was conducted due to national recommendations for restrictions regarding the COVID-19 Pandemic (e.g. social distancing) in an effort to limit this patient's exposure and mitigate transmission in our community.  Due to her co-morbid illnesses, this patient is at least at moderate risk for complications without adequate follow up.  This format is felt to be most appropriate for this patient at this time.  The patient did not have access to video technology/had technical difficulties with video requiring transitioning to audio format only (telephone).  All issues noted in this document were discussed and addressed.  No physical exam could be performed with this format.  Please refer to the patient's chart for her  consent to telehealth for [facility].   Date:  03/31/2019   ID:  Siearra, Amberg [birth year], MRN [id number]  Patient Location: Home Provider Location: Office  PCP:  Terald Sleeper, PA-C  Cardiologist:  Carlyle Dolly, MD  Electrophysiologist:  None   Evaluation Performed:  Follow-Up Visit  Chief Complaint:  Follow up visit  History of Present Illness:    Jaxson Keener is a 73 y.o. female seen today for follow up of the following medical problems.   1. Leg edema/Chronic diastolic HF  -0/8657 LE venous US [place]: no DVT   09/2017 echo LVEF 84-69%, no WMAs,Diastolic function is abnormal, indeterminant grade  - increased leg swelling. Seen in ER 05/19/09 - takes lasix 40mg  daily, did not control swelling.  - has increased to 60mg  daily. Has not started KCl.  - increased lasix to 80mg  daily. Weights began to trend down  Weight down from 228 lbs in 04/2018 to 207 lbs.  - May 28, 2018 labs Cr 1.29 BUN 22 (prior Cr 1.04 and BUN 17)    - t in Feb 2020 we lowered her lasix to 80mg  alternative with 60mg , referred to lymphedema clinic. Weight was down from 228 to 207 lbs at that visit - her labs after that appt  showed Cr up from 1.04 to 2.64. Lasix was held a few days then resumed 80mg  alternating with 60mg .  - repeat labs 3/13 showd Cr down to 1.29.     -recently on lasix 60mg  just prn weight above 207 lbs. - weight stable 207-209 lbs. Compression stockings are uncomfortable, difficult to wear - no recent SOB or DOE.  - has not wanted to go to lymphedema clinic due to covid risks, also limitations on transportation    The patient does not have symptoms concerning for COVID-19 infection (fever, chills, cough, or new shortness of breath).    Past Medical History:  Diagnosis Date  . Allergy   . Bronchitis, chronic ([place])   . Chronic anxiety   . Complication of anesthesia    hard to wake up  . COPD (chronic obstructive pulmonary disease) ([place])   . Depression   . Esophageal reflux   . Headache(784.0)   . Hyperlipidemia   . Hypertension   . Hypothyroidism   . Obesity   . Osteoporosis   . Overactive bladder   . PVC's (premature ventricular contractions)   . Thyroid disease   . Vitamin D deficiency disease    Past Surgical History:  Procedure Laterality Date  . ABDOMINAL HYSTERECTOMY    . CHOLECYSTECTOMY N/A 11/04/2013   Procedure: LAPAROSCOPIC CHOLECYSTECTOMY WITH INTRAOPERATIVE CHOLANGIOGRAM;  Surgeon: Imogene Burn. Georgette Dover, MD;  Location: [facility];  Service: General;  Laterality: N/A;  . COLONOSCOPY    . FRACTURE  SURGERY     pins removed from Hip surgery  . HIP FRACTURE SURGERY  1990    pins removed in 1991  . PARTIAL HYSTERECTOMY  1987  . SPINAL FUSION    . UPPER GASTROINTESTINAL ENDOSCOPY       No outpatient medications have been marked as taking for the 03/31/19 encounter (Appointment) with Arnoldo Lenis, MD.     Allergies:   Celebrex [celecoxib], Keflet [cephalexin], Penicillins, Sulfa antibiotics, Kenalog [triamcinolone acetonide], Latex, and Lisinopril   Social History   Tobacco Use  . Smoking status: Never Smoker  . Smokeless tobacco: Never Used  Substance Use  Topics  . Alcohol use: No  . Drug use: No     Family Hx: The patient's family history includes Arthritis in an other family member; Cancer in an other family member; Diabetes in an other family member; Heart disease in an other family member; OCD in an other family member.  ROS:   Please see the history of present illness.     All other systems reviewed and are negative.   Prior CV studies:   The following studies were reviewed today:  09/2017 echo Study Conclusions  - Left ventricle: The cavity size was normal. Wall thickness was increased in a pattern of mild LVH. Systolic function was normal. The estimated ejection fraction was in the range of 60% to 65%. Wall motion was normal; there were no regional wall motion abnormalities. - Aortic valve: Mildly calcified annulus. Trileaflet; mildly thickened leaflets. Mean gradient (S): 8 mm Hg. Valve area (VTI): 2.13 cm^2. Valve area (Vmax): 1.86 cm^2. Valve area (Vmean): 2.05 cm^2. - Left atrium: The atrium was severely dilated. - Right atrium: The atrium was mildly dilated. - Technically adequate study.  Labs/Other Tests and Data Reviewed:    EKG:  No ECG reviewed.  Recent Labs: 05/19/2018: B Natriuretic Peptide 161.0 08/07/2018: Magnesium WILL FOLLOW 02/04/2019: ALT 12; BUN 27; Creatinine, Ser 1.59; Hemoglobin 11.3; Platelets 338; Potassium 4.1; Sodium 137   Recent Lipid Panel Lab Results  Component Value Date/Time   CHOL WILL FOLLOW 08/07/2018 04:16 PM   TRIG WILL FOLLOW 08/07/2018 04:16 PM   HDL WILL FOLLOW 08/07/2018 04:16 PM   CHOLHDL WILL FOLLOW 08/07/2018 04:16 PM   LDLCALC WILL FOLLOW 08/07/2018 04:16 PM    Wt Readings from Last 3 Encounters:  02/04/19 205 lb 6.4 oz (93.2 kg)  09/01/18 203 lb (92.1 kg)  08/07/18 207 lb (93.9 kg)     Objective:    Vital Signs:   Today's Vitals   03/31/19 1259  Weight: 209 lb (94.8 kg)  Height: 5\' 4"  (1.626 m)   Body mass index is 35.87 kg/m. Normal  affect. Normal speech pattern and tone. Comfortable, no apparent distress. No audible signs of SOB or wheezing.   ASSESSMENT & PLAN:    1. Leg edema/Chronic diastolic HF - supsect combined diastolic HF and lymphedema - on maximally tolerated diuretic, on higher doses signicant AKI. We will go ahead and repeat a BMET and Mg - get into lypmhedema clinic when covid risks lower and she has more stable transporation.      COVID-19 Education: The signs and symptoms of COVID-19 were discussed with the patient and how to seek care for testing (follow up with PCP or arrange E-visit).  The importance of social distancing was discussed today.  Time:   Today, I have spent 14 minutes with the patient with telehealth technology discussing the above problems.     Medication Adjustments/Labs  and Tests Ordered: Current medicines are reviewed at length with the patient today.  Concerns regarding medicines are outlined above.   Tests Ordered: No orders of the defined types were placed in this encounter.   Medication Changes: No orders of the defined types were placed in this encounter.   Follow Up:  In Person in 4 month(s)  Signed, Carlyle Dolly, MD  03/31/2019 10:00 AM    [place]

## 2019-04-07 ENCOUNTER — Telehealth: Payer: Self-pay | Admitting: Physician Assistant

## 2019-04-07 NOTE — Chronic Care Management (AMB) (Signed)
Chronic Care Management   Note  04/07/2019 Name: [name] MRN: [id number] DOB: [birth year]  [name] is a 73 y.o. year old female who is a primary care patient of Terald Sleeper, PA-C. I reached out to [name] by phone today in response to a referral sent by Ms. Raaga Holsinger's health plan.     Ms. Twining was given information about Chronic Care Management services today including:  1. CCM service includes personalized support from designated clinical staff supervised by her physician, including individualized plan of care and coordination with other care providers 2. 24/7 contact phone numbers for assistance for urgent and routine care needs. 3. Service will only be billed when office clinical staff spend 20 minutes or more in a month to coordinate care. 4. Only one practitioner may furnish and bill the service in a calendar month. 5. The patient may stop CCM services at any time (effective at the end of the month) by phone call to the office staff. 6. The patient will be responsible for cost sharing (co-pay) of up to 20% of the service fee (after annual deductible is met).  Patient did not agree to enrollment in care management services and does not wish to consider at this time.  Follow up plan: The care management team is available to follow up with the patient after provider conversation with the patient regarding recommendation for care management engagement and subsequent re-referral to the care management team.   [place], [place], [place] [address] Direct Dial: [email or web address]  Website: [email or web address]

## 2019-05-07 ENCOUNTER — Other Ambulatory Visit: Payer: Self-pay

## 2019-05-10 ENCOUNTER — Ambulatory Visit: Payer: Medicare Other | Admitting: Physician Assistant

## 2019-05-24 ENCOUNTER — Telehealth: Payer: Self-pay | Admitting: Cardiology

## 2019-05-24 ENCOUNTER — Other Ambulatory Visit: Payer: Self-pay

## 2019-05-24 NOTE — Telephone Encounter (Signed)
I agree, needs pcp assessment   J Leinaala Catanese MD

## 2019-05-24 NOTE — Telephone Encounter (Signed)
Patient has history of back surgery with rods/screws a few years ago.Now over three days has spasm like pain in her lower back. Her niece is a Marine scientist and told her to call cardiology . She has chronic leg edema and weight is unchanged at 207 lbs. She denies dysuria. I advise her to speak with her pcp regarding her back pain

## 2019-05-24 NOTE — Telephone Encounter (Signed)
Attempt to reach, mailbox full-cc

## 2019-05-24 NOTE — Telephone Encounter (Signed)
Back has been hurting and legs swelling.  Daughter told her she needs to see Dr Harl Bowie  Call her at home first then try cell number

## 2019-05-24 NOTE — Progress Notes (Signed)
Assessment & Plan:  1. Right leg pain - Steroid injection given in office today and taper sent to pharmacy.  Patient instructed not to start oral prednisone until tomorrow.  Discussed her back pain can have an effect on her leg.  Patient would like a refill of her oxycodone.  Discussed that I would like to try steroids first but that she may go ahead and schedule a follow-up appointment of this pain with her PCP who would be able to address long-term pain medication for her back. - CMP14+EGFR - Magnesium - predniSONE (STERAPRED UNI-PAK 21 TAB) 10 MG (21) TBPK tablet; Use as directed on back of pill pack  Dispense: 21 tablet; Refill: 0 - methylPREDNISolone acetate (DEPO-MEDROL) injection 80 mg  2. Bilateral lower extremity edema - Encourage patient to elevate her legs as much as possible.  Continue wearing compression hose daily.  Increase Lasix to 60 mg twice daily for the next 3 days.  I have asked that she return next week for a recheck of her labs on either Monday or Tuesday.  BMP ordered for the future.  We also discussed lymphedema clinic but patient is adamant that she does not have transportation and is unwilling to use [facility]. - CMP14+EGFR  3. Chronic kidney disease, unspecified CKD stage - Microalbumin / creatinine urine ratio - CMP14+EGFR   Follow up plan: Return ASAP with PCP.  Hendricks Limes, MSN, APRN, FNP-C [facility]  Subjective:   Patient ID: [name], female    DOB: [birth year], 73 y.o.   MRN: [id number]  HPI: [name] is a 73 y.o. female presenting on 05/25/2019 for Leg Swelling (Bilateral -States she has been going to Dr. Harl Bowie x 1 year but her legs have gotten worse the last few days.)  Patient complains of lower extremity swelling and leg pain.  Patient initially called her cardiologist due to her lower extremity edema and was told that she needed to speak to her PCP regarding her back pain.  They did not address lower extremity  edema.  Patient reports an increase in her lower extremity swelling.  She reports she has been up on her feet a lot due to the holidays.  She has also been sitting up in a chair most of the day and night in front of a heater to keep warm.  She has not been elevating her legs at all.  Patient has gained 9 pounds recently.  She has not been checking her weights at home as she reports her scale is not working.  She currently rotates 60 mg and 80 mg of furosemide daily.  Patient does have lymphedema and has been referred to a lymphedema clinic but has been unable to go due to transportation issues.  She reports she did used to ride our cats but is unwilling to do so during the current COVID-19 pandemic.  The pain in her leg occurs on the right side.  She reports it starts in her thigh and goes up into her groin and her back.  She describes it as feeling like something "drawing up" in her leg.  She currently rates the pain 9 out of 10 today.  Reports an inability to get into the bed 2 nights ago as she was unable to lift her leg.  She has been applying icy hot which is somewhat helpful.  She also feels that walking is helpful.  Patient had back surgery 3-1/2 years ago at which time she reports rods and screws were  placed.  She has some leftover oxycodone from that surgery which she has been taking.  She currently has 2 left.   ROS: Negative unless specifically indicated above in HPI.   Relevant past medical history reviewed and updated as indicated.   Allergies and medications reviewed and updated.   Current Outpatient Medications:  .  acetaminophen (TYLENOL) 650 MG CR tablet, Take 650 mg by mouth every 8 (eight) hours as needed for pain., Disp: , Rfl:  .  ALPRAZolam (XANAX) 0.5 MG tablet, TAKE 1 TABLET 3 TIMES DAILY AS NEEDED FOR ANXIETY, Disp: 90 tablet, Rfl: 5 .  aspirin 81 MG EC tablet, Take 1 tablet (81 mg total) by mouth daily. Swallow whole., Disp: 30 tablet, Rfl: 12 .  calcium carbonate (OS-CAL  - DOSED IN MG OF ELEMENTAL CALCIUM) 1250 (500 Ca) MG tablet, Take 1 tablet by mouth once a week. , Disp: , Rfl:  .  cholecalciferol (VITAMIN D) 1000 units tablet, Take 1,000 Units by mouth daily., Disp: , Rfl:  .  Clobetasol Prop Emollient Base 0.05 % emollient cream, Apply 1 application topically 2 (two) times daily., Disp: 60 g, Rfl: 5 .  furosemide (LASIX) 40 MG tablet, TAKE 60 MG (1 AND 1/2 TABLETS) DAILY AS NEEDED FOR WEIGHT GAIN OVER 207 LBS (Patient taking differently: 80 mg. '60mg'$  then switch to 80 mg), Disp: 90 tablet, Rfl: 3 .  levothyroxine (SYNTHROID) 50 MCG tablet, TAKE ONE (1) TABLET EACH DAY, Disp: 90 tablet, Rfl: 0 .  Multiple Vitamin (MULTIVITAMIN) tablet, Take 1 tablet by mouth 2 (two) times a week. , Disp: , Rfl:  .  omeprazole-sodium bicarbonate (ZEGERID) 40-1100 MG capsule, Take 1 capsule by mouth daily before breakfast. (Patient taking differently: Take 1 capsule by mouth daily as needed (for indogestion). ), Disp: , Rfl:  .  potassium chloride SA (KLOR-CON) 20 MEQ tablet, TAKE ONE (1) TABLET EACH DAY, Disp: 90 tablet, Rfl: 3 .  sertraline (ZOLOFT) 100 MG tablet, Take 1 tablet (100 mg total) by mouth daily., Disp: 30 tablet, Rfl: 5 .  telmisartan (MICARDIS) 40 MG tablet, Take 40 mg by mouth daily., Disp: , Rfl:  .  predniSONE (STERAPRED UNI-PAK 21 TAB) 10 MG (21) TBPK tablet, Use as directed on back of pill pack, Disp: 21 tablet, Rfl: 0  Allergies  Allergen Reactions  . Celebrex [Celecoxib] Swelling  . Keflet [Cephalexin] Swelling  . Penicillins Hives and Swelling    DID THE REACTION INVOLVE: Swelling of the face/tongue/throat, SOB, or low BP? Yes-swelling-hives Sudden or severe rash/hives, skin peeling, or the inside of the mouth or nose? Unknown Did it require medical treatment? Unknown When did it last happen?over 10 years If all above answers are "NO", may proceed with cephalosporin use.   . Sulfa Antibiotics Swelling  . Kenalog [Triamcinolone Acetonide]      unknown  . Latex   . Lisinopril Other (See Comments)    unknown  . Mobic [Meloxicam]     SWELLING    Objective:   BP 111/71   Pulse 66   Temp 98.6 F (37 C) (Temporal)   Ht '5\' 4"'$  (1.626 m)   Wt 218 lb 6.4 oz (99.1 kg)   SpO2 100%   BMI 37.49 kg/m    Physical Exam Vitals reviewed.  Constitutional:      General: She is not in acute distress.    Appearance: Normal appearance. She is obese. She is not ill-appearing, toxic-appearing or diaphoretic.  HENT:     Head: Normocephalic and  atraumatic.  Eyes:     General: No scleral icterus.       Right eye: No discharge.        Left eye: No discharge.     Conjunctiva/sclera: Conjunctivae normal.  Cardiovascular:     Rate and Rhythm: Normal rate and regular rhythm.     Heart sounds: Normal heart sounds. No murmur. No friction rub. No gallop.   Pulmonary:     Effort: Pulmonary effort is normal. No respiratory distress.     Breath sounds: Normal breath sounds. No stridor. No wheezing, rhonchi or rales.  Musculoskeletal:        General: Normal range of motion.     Cervical back: Normal range of motion.     Lumbar back: Tenderness present.     Right hip: No bony tenderness.     Right upper leg: No swelling, edema, deformity, lacerations, tenderness or bony tenderness.     Right lower leg: 2+ Edema present.     Left lower leg: 2+ Edema present.  Skin:    General: Skin is warm and dry.     Capillary Refill: Capillary refill takes less than 2 seconds.  Neurological:     General: No focal deficit present.     Mental Status: She is alert and oriented to person, place, and time. Mental status is at baseline.  Psychiatric:        Mood and Affect: Mood normal.        Behavior: Behavior normal.        Thought Content: Thought content normal.        Judgment: Judgment normal.

## 2019-05-25 ENCOUNTER — Ambulatory Visit (INDEPENDENT_AMBULATORY_CARE_PROVIDER_SITE_OTHER): Payer: Medicare Other | Admitting: Family Medicine

## 2019-05-25 ENCOUNTER — Encounter: Payer: Self-pay | Admitting: Family Medicine

## 2019-05-25 VITALS — BP 111/71 | HR 66 | Temp 98.6°F | Ht 64.0 in | Wt 218.4 lb

## 2019-05-25 DIAGNOSIS — R600 Localized edema: Secondary | ICD-10-CM | POA: Diagnosis not present

## 2019-05-25 DIAGNOSIS — N189 Chronic kidney disease, unspecified: Secondary | ICD-10-CM

## 2019-05-25 DIAGNOSIS — M79604 Pain in right leg: Secondary | ICD-10-CM

## 2019-05-25 MED ORDER — LEVOTHYROXINE SODIUM 50 MCG PO TABS: ORAL_TABLET | ORAL | 0 refills | Status: DC

## 2019-05-25 MED ORDER — PREDNISONE 10 MG (21) PO TBPK: ORAL_TABLET | ORAL | 0 refills | Status: DC

## 2019-05-25 MED ORDER — METHYLPREDNISOLONE ACETATE 80 MG/ML IJ SUSP
80.0000 mg | Freq: Once | INTRAMUSCULAR | Status: AC
  Administered 2019-05-25: 11:00:00 80 mg via INTRAMUSCULAR

## 2019-05-25 NOTE — Patient Instructions (Addendum)
Take Lasix 60 mg twice daily x3 days. Return next week for recheck of labs - Monday or Tuesday.   Start Prednisone pills tomorrow.  Elevate legs as much as possible.

## 2019-05-26 LAB — CMP14+EGFR
ALT: 18 IU/L (ref 0–32)
AST: 36 IU/L (ref 0–40)
Albumin/Globulin Ratio: 1.6 (ref 1.2–2.2)
Albumin: 4.2 g/dL (ref 3.7–4.7)
Alkaline Phosphatase: 119 IU/L — ABNORMAL HIGH (ref 39–117)
BUN/Creatinine Ratio: 9 — ABNORMAL LOW (ref 12–28)
BUN: 14 mg/dL (ref 8–27)
Bilirubin Total: 0.5 mg/dL (ref 0.0–1.2)
CO2: 26 mmol/L (ref 20–29)
Calcium: 9.7 mg/dL (ref 8.7–10.3)
Chloride: 98 mmol/L (ref 96–106)
Creatinine, Ser: 1.5 mg/dL — ABNORMAL HIGH (ref 0.57–1.00)
GFR calc Af Amer: 40 mL/min/{1.73_m2} — ABNORMAL LOW (ref >59)
GFR calc non Af Amer: 35 mL/min/{1.73_m2} — ABNORMAL LOW (ref >59)
Globulin, Total: 2.6 g/dL (ref 1.5–4.5)
Glucose: 97 mg/dL (ref 65–99)
Potassium: 2.7 mmol/L — ABNORMAL LOW (ref 3.5–5.2)
Sodium: 141 mmol/L (ref 134–144)
Total Protein: 6.8 g/dL (ref 6.0–8.5)

## 2019-05-26 LAB — MICROALBUMIN / CREATININE URINE RATIO
Creatinine, Urine: 181.6 mg/dL
Microalb/Creat Ratio: 8 mg/g creat (ref 0–29)
Microalbumin, Urine: 14.7 ug/mL

## 2019-05-26 LAB — MAGNESIUM: Magnesium: 1.7 mg/dL (ref 1.6–2.3)

## 2019-05-31 ENCOUNTER — Other Ambulatory Visit: Payer: Self-pay

## 2019-05-31 ENCOUNTER — Encounter: Payer: Self-pay | Admitting: Family Medicine

## 2019-06-01 ENCOUNTER — Ambulatory Visit (INDEPENDENT_AMBULATORY_CARE_PROVIDER_SITE_OTHER): Payer: Medicare Other | Admitting: Physician Assistant

## 2019-06-01 ENCOUNTER — Ambulatory Visit (INDEPENDENT_AMBULATORY_CARE_PROVIDER_SITE_OTHER): Payer: Medicare Other

## 2019-06-01 ENCOUNTER — Encounter: Payer: Self-pay | Admitting: Physician Assistant

## 2019-06-01 VITALS — BP 139/78 | HR 60 | Temp 96.4°F | Ht 64.0 in | Wt 221.2 lb

## 2019-06-01 DIAGNOSIS — E039 Hypothyroidism, unspecified: Secondary | ICD-10-CM | POA: Diagnosis not present

## 2019-06-01 DIAGNOSIS — M4716 Other spondylosis with myelopathy, lumbar region: Secondary | ICD-10-CM | POA: Diagnosis not present

## 2019-06-01 DIAGNOSIS — E7800 Pure hypercholesterolemia, unspecified: Secondary | ICD-10-CM | POA: Diagnosis not present

## 2019-06-01 DIAGNOSIS — M48061 Spinal stenosis, lumbar region without neurogenic claudication: Secondary | ICD-10-CM | POA: Diagnosis not present

## 2019-06-01 DIAGNOSIS — I10 Essential (primary) hypertension: Secondary | ICD-10-CM | POA: Diagnosis not present

## 2019-06-01 DIAGNOSIS — M549 Dorsalgia, unspecified: Secondary | ICD-10-CM | POA: Diagnosis not present

## 2019-06-01 MED ORDER — HYDROCODONE-ACETAMINOPHEN 5-325 MG PO TABS: 1.0000 | ORAL_TABLET | Freq: Four times a day (QID) | ORAL | 0 refills | Status: DC | PRN

## 2019-06-02 ENCOUNTER — Other Ambulatory Visit: Payer: Self-pay | Admitting: Physician Assistant

## 2019-06-02 ENCOUNTER — Telehealth: Payer: Self-pay | Admitting: Physician Assistant

## 2019-06-02 LAB — CMP14+EGFR
ALT: 28 IU/L (ref 0–32)
AST: 30 IU/L (ref 0–40)
Albumin/Globulin Ratio: 1.7 (ref 1.2–2.2)
Albumin: 4 g/dL (ref 3.7–4.7)
Alkaline Phosphatase: 120 IU/L — ABNORMAL HIGH (ref 39–117)
BUN/Creatinine Ratio: 20 (ref 12–28)
BUN: 21 mg/dL (ref 8–27)
Bilirubin Total: 0.4 mg/dL (ref 0.0–1.2)
CO2: 30 mmol/L — ABNORMAL HIGH (ref 20–29)
Calcium: 9.2 mg/dL (ref 8.7–10.3)
Chloride: 94 mmol/L — ABNORMAL LOW (ref 96–106)
Creatinine, Ser: 1.04 mg/dL — ABNORMAL HIGH (ref 0.57–1.00)
GFR calc Af Amer: 62 mL/min/{1.73_m2} (ref >59)
GFR calc non Af Amer: 54 mL/min/{1.73_m2} — ABNORMAL LOW (ref >59)
Globulin, Total: 2.4 g/dL (ref 1.5–4.5)
Glucose: 94 mg/dL (ref 65–99)
Potassium: 2.8 mmol/L — ABNORMAL LOW (ref 3.5–5.2)
Sodium: 141 mmol/L (ref 134–144)
Total Protein: 6.4 g/dL (ref 6.0–8.5)

## 2019-06-02 LAB — CBC WITH DIFFERENTIAL/PLATELET
Basophils Absolute: 0 10*3/uL (ref 0.0–0.2)
Basos: 0 %
EOS (ABSOLUTE): 0.1 10*3/uL (ref 0.0–0.4)
Eos: 1 %
Hematocrit: 36 % (ref 34.0–46.6)
Hemoglobin: 12 g/dL (ref 11.1–15.9)
Immature Grans (Abs): 0.1 10*3/uL (ref 0.0–0.1)
Immature Granulocytes: 1 %
Lymphocytes Absolute: 2.8 10*3/uL (ref 0.7–3.1)
Lymphs: 22 %
MCH: 29.6 pg (ref 26.6–33.0)
MCHC: 33.3 g/dL (ref 31.5–35.7)
MCV: 89 fL (ref 79–97)
Monocytes Absolute: 1 10*3/uL — ABNORMAL HIGH (ref 0.1–0.9)
Monocytes: 8 %
Neutrophils Absolute: 8.8 10*3/uL — ABNORMAL HIGH (ref 1.4–7.0)
Neutrophils: 68 %
Platelets: 453 10*3/uL — ABNORMAL HIGH (ref 150–450)
RBC: 4.06 x10E6/uL (ref 3.77–5.28)
RDW: 12.6 % (ref 11.7–15.4)
WBC: 12.8 10*3/uL — ABNORMAL HIGH (ref 3.4–10.8)

## 2019-06-02 LAB — LIPID PANEL
Chol/HDL Ratio: 2.1 ratio (ref 0.0–4.4)
Cholesterol, Total: 139 mg/dL (ref 100–199)
HDL: 65 mg/dL (ref >39)
LDL Chol Calc (NIH): 46 mg/dL (ref 0–99)
Triglycerides: 171 mg/dL — ABNORMAL HIGH (ref 0–149)
VLDL Cholesterol Cal: 28 mg/dL (ref 5–40)

## 2019-06-02 LAB — TSH: TSH: 1.52 u[IU]/mL (ref 0.450–4.500)

## 2019-06-02 MED ORDER — CLINDAMYCIN HCL 300 MG PO CAPS: 300.0000 mg | ORAL_CAPSULE | Freq: Three times a day (TID) | ORAL | 0 refills | Status: DC

## 2019-06-02 NOTE — Telephone Encounter (Signed)
Patient Wants results for xray

## 2019-06-02 NOTE — Telephone Encounter (Signed)
Result in results basket now

## 2019-06-06 ENCOUNTER — Encounter: Payer: Self-pay | Admitting: Physician Assistant

## 2019-06-06 NOTE — Progress Notes (Signed)
Acute Office Visit  Subjective:    Patient ID: [name], female    DOB: 1945/07/15, 74 y.o.   MRN: [id number]  Chief Complaint  Patient presents with  . Leg Pain    follow up   . Hip Pain  . Back Pain    Leg Pain  The incident occurred more than 1 week ago. There was no injury mechanism. The pain is present in the left leg and right leg. The quality of the pain is described as aching and shooting. The pain is at a severity of 8/10. The pain is severe. The pain has been worsening since onset. Associated symptoms include muscle weakness. Pertinent negatives include no loss of sensation, numbness or tingling. The symptoms are aggravated by movement.  Hip Pain  The incident occurred more than 1 week ago. There was no injury mechanism. The pain is present in the right leg and left leg. The pain is at a severity of 8/10. Associated symptoms include muscle weakness. Pertinent negatives include no loss of sensation, numbness or tingling. She has tried heat, ice and rest for the symptoms. The treatment provided no relief.  Back Pain This is a chronic problem. The current episode started more than 1 year ago. The problem occurs daily. The problem has been gradually worsening since onset. The pain is present in the lumbar spine. The quality of the pain is described as aching. The pain is at a severity of 8/10. Associated symptoms include leg pain and weakness. Pertinent negatives include no abdominal pain, chest pain, dysuria, fever, numbness or tingling.    Past Medical History:  Diagnosis Date  . Allergy   . Bronchitis, chronic ([place])   . Chronic anxiety   . Complication of anesthesia    hard to wake up  . COPD (chronic obstructive pulmonary disease) ([place])   . Depression   . Esophageal reflux   . Headache(784.0)   . Hyperlipidemia   . Hypertension   . Hypothyroidism   . Obesity   . Osteoporosis   . Overactive bladder   . PVC's (premature ventricular contractions)   . Thyroid  disease   . Vitamin D deficiency disease     Past Surgical History:  Procedure Laterality Date  . ABDOMINAL HYSTERECTOMY    . CHOLECYSTECTOMY N/A 11/04/2013   Procedure: LAPAROSCOPIC CHOLECYSTECTOMY WITH INTRAOPERATIVE CHOLANGIOGRAM;  Surgeon: Imogene Burn. Georgette Dover, MD;  Location: [place];  Service: General;  Laterality: N/A;  . COLONOSCOPY    . FRACTURE SURGERY     pins removed from Hip surgery  . HIP FRACTURE SURGERY  1990    pins removed in 1991  . PARTIAL HYSTERECTOMY  1987  . SPINAL FUSION    . UPPER GASTROINTESTINAL ENDOSCOPY      Family History  Problem Relation Age of Onset  . Arthritis Other   . Heart disease Other   . Cancer Other   . Diabetes Other   . OCD Other     Social History   Socioeconomic History  . Marital status: Single    Spouse name: Not on file  . Number of children: 2  . Years of education: Not on file  . Highest education level: Not on file  Occupational History  . Not on file  Tobacco Use  . Smoking status: Never Smoker  . Smokeless tobacco: Never Used  Substance and Sexual Activity  . Alcohol use: No  . Drug use: No  . Sexual activity: Not on file  Other Topics Concern  .  Not on file  Social History Narrative  . Not on file   Social Determinants of Health   Financial Resource Strain:   . Difficulty of Paying Living Expenses: Not on file  Food Insecurity:   . Worried About Charity fundraiser in the Last Year: Not on file  . Ran Out of Food in the Last Year: Not on file  Transportation Needs:   . Lack of Transportation (Medical): Not on file  . Lack of Transportation (Non-Medical): Not on file  Physical Activity:   . Days of Exercise per Week: Not on file  . Minutes of Exercise per Session: Not on file  Stress:   . Feeling of Stress : Not on file  Social Connections:   . Frequency of Communication with Friends and Family: Not on file  . Frequency of Social Gatherings with Friends and Family: Not on file  . Attends Religious  Services: Not on file  . Active Member of Clubs or Organizations: Not on file  . Attends Archivist Meetings: Not on file  . Marital Status: Not on file  Intimate Partner Violence:   . Fear of Current or Ex-Partner: Not on file  . Emotionally Abused: Not on file  . Physically Abused: Not on file  . Sexually Abused: Not on file    Outpatient Medications Prior to Visit  Medication Sig Dispense Refill  . acetaminophen (TYLENOL) 650 MG CR tablet Take 650 mg by mouth every 8 (eight) hours as needed for pain.    Marland Kitchen ALPRAZolam (XANAX) 0.5 MG tablet TAKE 1 TABLET 3 TIMES DAILY AS NEEDED FOR ANXIETY 90 tablet 5  . aspirin 81 MG EC tablet Take 1 tablet (81 mg total) by mouth daily. Swallow whole. 30 tablet 12  . calcium carbonate (OS-CAL - DOSED IN MG OF ELEMENTAL CALCIUM) 1250 (500 Ca) MG tablet Take 1 tablet by mouth once a week.     . cholecalciferol (VITAMIN D) 1000 units tablet Take 1,000 Units by mouth daily.    . Clobetasol Prop Emollient Base 0.05 % emollient cream Apply 1 application topically 2 (two) times daily. 60 g 5  . furosemide (LASIX) 40 MG tablet TAKE 60 MG (1 AND 1/2 TABLETS) DAILY AS NEEDED FOR WEIGHT GAIN OVER 207 LBS (Patient taking differently: 80 mg. 60mg  then switch to 80 mg) 90 tablet 3  . levothyroxine (SYNTHROID) 50 MCG tablet TAKE ONE (1) TABLET EACH DAY 90 tablet 0  . Multiple Vitamin (MULTIVITAMIN) tablet Take 1 tablet by mouth 2 (two) times a week.     Marland Kitchen omeprazole-sodium bicarbonate (ZEGERID) 40-1100 MG capsule Take 1 capsule by mouth daily before breakfast. (Patient taking differently: Take 1 capsule by mouth daily as needed (for indogestion). )    . potassium chloride SA (KLOR-CON) 20 MEQ tablet TAKE ONE (1) TABLET EACH DAY 90 tablet 3  . sertraline (ZOLOFT) 100 MG tablet Take 1 tablet (100 mg total) by mouth daily. 30 tablet 5  . telmisartan (MICARDIS) 40 MG tablet Take 40 mg by mouth daily.    . predniSONE (STERAPRED UNI-PAK 21 TAB) 10 MG (21) TBPK tablet  Use as directed on back of pill pack 21 tablet 0   No facility-administered medications prior to visit.    Allergies  Allergen Reactions  . Celebrex [Celecoxib] Swelling  . Keflet [Cephalexin] Swelling  . Penicillins Hives and Swelling    DID THE REACTION INVOLVE: Swelling of the face/tongue/throat, SOB, or low BP? Yes-swelling-hives Sudden or severe rash/hives, skin  peeling, or the inside of the mouth or nose? Unknown Did it require medical treatment? Unknown When did it last happen?over 10 years If all above answers are "NO", may proceed with cephalosporin use.   . Sulfa Antibiotics Swelling  . Kenalog [Triamcinolone Acetonide]     unknown  . Latex   . Lisinopril Other (See Comments)    unknown  . Mobic [Meloxicam]     SWELLING    Review of Systems  Constitutional: Negative.  Negative for activity change, fatigue and fever.  HENT: Negative.   Eyes: Negative.   Respiratory: Negative.  Negative for cough.   Cardiovascular: Negative.  Negative for chest pain.  Gastrointestinal: Negative.  Negative for abdominal pain.  Endocrine: Negative.   Genitourinary: Negative.  Negative for dysuria.  Musculoskeletal: Positive for arthralgias, back pain, gait problem and myalgias.  Skin: Negative.   Neurological: Positive for weakness. Negative for tingling and numbness.       Objective:    Physical Exam Constitutional:      General: She is not in acute distress.    Appearance: Normal appearance. She is well-developed.  HENT:     Head: Normocephalic and atraumatic.  Cardiovascular:     Rate and Rhythm: Normal rate.  Pulmonary:     Effort: Pulmonary effort is normal.  Musculoskeletal:     Lumbar back: Spasms and tenderness present. Decreased range of motion. Positive right straight leg raise test and positive left straight leg raise test.       Back:  Skin:    General: Skin is warm and dry.     Findings: No rash.  Neurological:     Mental Status: She is alert and  oriented to person, place, and time.     Deep Tendon Reflexes: Reflexes are normal and symmetric.     BP 139/78   Pulse 60   Temp (!) 96.4 F (35.8 C) (Temporal)   Ht 5\' 4"  (1.626 m)   Wt 221 lb 3.2 oz (100.3 kg)   SpO2 100%   BMI 37.97 kg/m  Wt Readings from Last 3 Encounters:  06/01/19 221 lb 3.2 oz (100.3 kg)  05/25/19 218 lb 6.4 oz (99.1 kg)  03/31/19 209 lb (94.8 kg)    Health Maintenance Due  Topic Date Due  . Hepatitis C Screening  1945/06/13  . TETANUS/TDAP  09/02/1965  . MAMMOGRAM  09/02/1996  . COLONOSCOPY  09/02/1996  . DEXA SCAN  09/03/2011  . PNA vac Low Risk Adult (1 of 2 - PCV13) 09/03/2011    There are no preventive care reminders to display for this patient.   Lab Results  Component Value Date   TSH 1.520 06/01/2019   Lab Results  Component Value Date   WBC 12.8 (H) 06/01/2019   HGB 12.0 06/01/2019   HCT 36.0 06/01/2019   MCV 89 06/01/2019   PLT 453 (H) 06/01/2019   Lab Results  Component Value Date   NA 141 06/01/2019   K 2.8 (L) 06/01/2019   CO2 30 (H) 06/01/2019   GLUCOSE 94 06/01/2019   BUN 21 06/01/2019   CREATININE 1.04 (H) 06/01/2019   BILITOT 0.4 06/01/2019   ALKPHOS 120 (H) 06/01/2019   AST 30 06/01/2019   ALT 28 06/01/2019   PROT 6.4 06/01/2019   ALBUMIN 4.0 06/01/2019   CALCIUM 9.2 06/01/2019   ANIONGAP 11 05/19/2018   Lab Results  Component Value Date   CHOL 139 06/01/2019   Lab Results  Component Value Date  HDL 65 06/01/2019   Lab Results  Component Value Date   LDLCALC 46 06/01/2019   Lab Results  Component Value Date   TRIG 171 (H) 06/01/2019   Lab Results  Component Value Date   CHOLHDL 2.1 06/01/2019   No results found for: HGBA1C     Assessment & Plan:   Problem List Items Addressed This Visit      Cardiovascular and Mediastinum   Essential hypertension     Endocrine   Thyroid activity decreased     Nervous and Auditory   Lumbar spondylosis with myelopathy - Primary   Relevant  Medications   HYDROcodone-acetaminophen (NORCO) 5-325 MG tablet   Other Relevant Orders   DG Lumbar Spine -back pain (Completed)   DG HIP UNILAT WITH PELVIS 2-3 VIEWS RIGHT (Completed)     Other   Hyperlipidemia       Meds ordered this encounter  Medications  . HYDROcodone-acetaminophen (NORCO) 5-325 MG tablet    Sig: Take 1 tablet by mouth every 6 (six) hours as needed for moderate pain.    Dispense:  30 tablet    Refill:  0    Order Specific Question:   Supervising Provider    Answer:   Janora Norlander [[id number]]     Terald Sleeper, PA-C

## 2019-06-07 NOTE — Telephone Encounter (Signed)
Aware of results. 

## 2019-06-09 ENCOUNTER — Encounter: Payer: Self-pay | Admitting: Physician Assistant

## 2019-06-09 ENCOUNTER — Ambulatory Visit (INDEPENDENT_AMBULATORY_CARE_PROVIDER_SITE_OTHER): Payer: Medicare Other | Admitting: Physician Assistant

## 2019-06-09 DIAGNOSIS — R29898 Other symptoms and signs involving the musculoskeletal system: Secondary | ICD-10-CM

## 2019-06-09 DIAGNOSIS — N3000 Acute cystitis without hematuria: Secondary | ICD-10-CM

## 2019-06-09 DIAGNOSIS — M4716 Other spondylosis with myelopathy, lumbar region: Secondary | ICD-10-CM

## 2019-06-09 DIAGNOSIS — Z981 Arthrodesis status: Secondary | ICD-10-CM

## 2019-06-09 MED ORDER — CIPROFLOXACIN HCL 250 MG PO TABS: 250.0000 mg | ORAL_TABLET | Freq: Two times a day (BID) | ORAL | 0 refills | Status: DC

## 2019-06-09 NOTE — Progress Notes (Signed)
1205       Telephone visit  Subjective: CC:uti PCP: Terald Sleeper, PA-C [id number] [name] is a 74 y.o. female calls for telephone consult today. Patient provides verbal consent for consult held via phone.  Patient is identified with 2 separate identifiers.  At this time the entire area is on COVID-19 social distancing and stay home orders are in place.  Patient is of higher risk and therefore we are performing this by a virtual method.  Location of patient: home Location of provider: [facility] Others present for call: no   When the patient was here last week she did have her back x-rayed and it did show degenerative changes along with her postsurgical changes.  She has continued to have severe pain in the low back radiating down the leg causing her to not be able to lift up her legs properly.  We are going to order an MRI due to these changes particularly in light of having had a previous surgery.  We are also going to have a referral to her orthopedist or neurosurgeon for possible injections.  The patient was possibly exposed to Covid last week.  The test or appointments need to be made on 127 or after and preferably late in the day or after business hours for the MRI.   This patient has had several days of dysuria, frequency and nocturia. There is also pain over the bladder in the suprapubic region, no back pain. Denies leakage or hematuria.  Denies fever or chills. No pain in flank area.    ROS: Per HPI  Allergies  Allergen Reactions  . Celebrex [Celecoxib] Swelling  . Keflet [Cephalexin] Swelling  . Penicillins Hives and Swelling    DID THE REACTION INVOLVE: Swelling of the face/tongue/throat, SOB, or low BP? Yes-swelling-hives Sudden or severe rash/hives, skin peeling, or the inside of the mouth or nose? Unknown Did it require medical treatment? Unknown When did it last happen?over 10 years If all above answers are "NO", may proceed with cephalosporin use.   . Sulfa  Antibiotics Swelling  . Kenalog [Triamcinolone Acetonide]     unknown  . Latex   . Lisinopril Other (See Comments)    unknown  . Mobic [Meloxicam]     SWELLING   Past Medical History:  Diagnosis Date  . Allergy   . Bronchitis, chronic ([place])   . Chronic anxiety   . Complication of anesthesia    hard to wake up  . COPD (chronic obstructive pulmonary disease) ([place])   . Depression   . Esophageal reflux   . Headache(784.0)   . Hyperlipidemia   . Hypertension   . Hypothyroidism   . Obesity   . Osteoporosis   . Overactive bladder   . PVC's (premature ventricular contractions)   . Thyroid disease   . Vitamin D deficiency disease     Current Outpatient Medications:  .  acetaminophen (TYLENOL) 650 MG CR tablet, Take 650 mg by mouth every 8 (eight) hours as needed for pain., Disp: , Rfl:  .  ALPRAZolam (XANAX) 0.5 MG tablet, TAKE 1 TABLET 3 TIMES DAILY AS NEEDED FOR ANXIETY, Disp: 90 tablet, Rfl: 5 .  aspirin 81 MG EC tablet, Take 1 tablet (81 mg total) by mouth daily. Swallow whole., Disp: 30 tablet, Rfl: 12 .  calcium carbonate (OS-CAL - DOSED IN MG OF ELEMENTAL CALCIUM) 1250 (500 Ca) MG tablet, Take 1 tablet by mouth once a week. , Disp: , Rfl:  .  cholecalciferol (VITAMIN D) 1000  units tablet, Take 1,000 Units by mouth daily., Disp: , Rfl:  .  ciprofloxacin (CIPRO) 250 MG tablet, Take 1 tablet (250 mg total) by mouth 2 (two) times daily., Disp: 14 tablet, Rfl: 0 .  Clobetasol Prop Emollient Base 0.05 % emollient cream, Apply 1 application topically 2 (two) times daily., Disp: 60 g, Rfl: 5 .  furosemide (LASIX) 40 MG tablet, TAKE 60 MG (1 AND 1/2 TABLETS) DAILY AS NEEDED FOR WEIGHT GAIN OVER 207 LBS (Patient taking differently: 80 mg. 60mg  then switch to 80 mg), Disp: 90 tablet, Rfl: 3 .  HYDROcodone-acetaminophen (NORCO) 5-325 MG tablet, Take 1 tablet by mouth every 6 (six) hours as needed for moderate pain., Disp: 30 tablet, Rfl: 0 .  levothyroxine (SYNTHROID) 50 MCG tablet, TAKE  ONE (1) TABLET EACH DAY, Disp: 90 tablet, Rfl: 0 .  Multiple Vitamin (MULTIVITAMIN) tablet, Take 1 tablet by mouth 2 (two) times a week. , Disp: , Rfl:  .  omeprazole-sodium bicarbonate (ZEGERID) 40-1100 MG capsule, Take 1 capsule by mouth daily before breakfast. (Patient taking differently: Take 1 capsule by mouth daily as needed (for indogestion). ), Disp: , Rfl:  .  potassium chloride SA (KLOR-CON) 20 MEQ tablet, TAKE ONE (1) TABLET EACH DAY, Disp: 90 tablet, Rfl: 3 .  sertraline (ZOLOFT) 100 MG tablet, Take 1 tablet (100 mg total) by mouth daily., Disp: 30 tablet, Rfl: 5 .  telmisartan (MICARDIS) 40 MG tablet, Take 40 mg by mouth daily., Disp: , Rfl:   Assessment/ Plan: 74 y.o. female   1. Acute cystitis without hematuria - ciprofloxacin (CIPRO) 250 MG tablet; Take 1 tablet (250 mg total) by mouth 2 (two) times daily.  Dispense: 14 tablet; Refill: 0  2. Lumbar spondylosis with myelopathy - MR Lumbar Spine Wo Contrast; Future - Ambulatory referral to Orthopedic Surgery  3. H/O spinal fusion - MR Lumbar Spine Wo Contrast; Future - Ambulatory referral to Orthopedic Surgery  4. Weakness of both legs - MR Lumbar Spine Wo Contrast; Future    No follow-ups on file.  Continue all other maintenance medications as listed above.  Start time: 12:05 PM End time: 12:19 PM  Meds ordered this encounter  Medications  . ciprofloxacin (CIPRO) 250 MG tablet    Sig: Take 1 tablet (250 mg total) by mouth 2 (two) times daily.    Dispense:  14 tablet    Refill:  0    Order Specific Question:   Supervising Provider    Answer:   Janora Norlander [[id number]]    Particia Nearing PA-C [place] [phone]

## 2019-07-28 ENCOUNTER — Other Ambulatory Visit: Payer: Self-pay | Admitting: Physician Assistant

## 2019-07-28 DIAGNOSIS — F419 Anxiety disorder, unspecified: Secondary | ICD-10-CM

## 2019-08-02 ENCOUNTER — Encounter: Payer: Self-pay | Admitting: Cardiology

## 2019-08-02 ENCOUNTER — Ambulatory Visit (INDEPENDENT_AMBULATORY_CARE_PROVIDER_SITE_OTHER): Payer: Medicare Other | Admitting: Cardiology

## 2019-08-02 ENCOUNTER — Other Ambulatory Visit: Payer: Self-pay

## 2019-08-02 VITALS — BP 138/78 | HR 58 | Ht 64.0 in | Wt 213.4 lb

## 2019-08-02 DIAGNOSIS — I5032 Chronic diastolic (congestive) heart failure: Secondary | ICD-10-CM

## 2019-08-02 DIAGNOSIS — R600 Localized edema: Secondary | ICD-10-CM | POA: Diagnosis not present

## 2019-08-02 DIAGNOSIS — E876 Hypokalemia: Secondary | ICD-10-CM | POA: Diagnosis not present

## 2019-08-02 MED ORDER — FUROSEMIDE 40 MG PO TABS: ORAL_TABLET | ORAL | 3 refills | Status: DC

## 2019-08-02 NOTE — Patient Instructions (Signed)
Your physician recommends that you schedule a follow-up appointment in: PENDING WITH DR BRANCH  PLEASE CALL us ABOUT YOUR APPOINTMENT WITH YOUR PRIMARY CARE PROVIDER   Your physician recommends that you continue on your current medications as directed. Please refer to the Current Medication list given to you today.  Thank you for choosing [facility]!!

## 2019-08-02 NOTE — Progress Notes (Signed)
Clinical Summary Ms. Skufca is a 74 y.o.female seen today for follow up of the following medical problems.   1. Leg edema/Chronic diastolic HF  -12/1854 LE venous US [place]: no DVT   09/2017 echo LVEF 31-49%, no WMAs,Diastolic function is abnormal, indeterminant grade    -recently on lasix 60mg  just prn weight above 207 lbs. - swelling is up and down.  - weight is down from 221 to 213 lbs - takes lasix prn, takes about 5 days a week   2. Hypokalemia - was 2.8 in Jan, was to f/u with pcp but she did not     Past Medical History:  Diagnosis Date  . Allergy   . Bronchitis, chronic ([place])   . Chronic anxiety   . Complication of anesthesia    hard to wake up  . COPD (chronic obstructive pulmonary disease) ([place])   . Depression   . Esophageal reflux   . Headache(784.0)   . Hyperlipidemia   . Hypertension   . Hypothyroidism   . Obesity   . Osteoporosis   . Overactive bladder   . PVC's (premature ventricular contractions)   . Thyroid disease   . Vitamin D deficiency disease      Allergies  Allergen Reactions  . Celebrex [Celecoxib] Swelling  . Keflet [Cephalexin] Swelling  . Penicillins Hives and Swelling    DID THE REACTION INVOLVE: Swelling of the face/tongue/throat, SOB, or low BP? Yes-swelling-hives Sudden or severe rash/hives, skin peeling, or the inside of the mouth or nose? Unknown Did it require medical treatment? Unknown When did it last happen?over 10 years If all above answers are "NO", may proceed with cephalosporin use.   . Sulfa Antibiotics Swelling  . Kenalog [Triamcinolone Acetonide]     unknown  . Latex   . Lisinopril Other (See Comments)    unknown  . Mobic [Meloxicam]     SWELLING     Current Outpatient Medications  Medication Sig Dispense Refill  . acetaminophen (TYLENOL) 650 MG CR tablet Take 650 mg by mouth every 8 (eight) hours as needed for pain.    Marland Kitchen ALPRAZolam (XANAX) 0.5 MG tablet TAKE 1 TABLET 3 TIMES DAILY  AS NEEDED FOR ANXIETY 90 tablet 5  . aspirin 81 MG EC tablet Take 1 tablet (81 mg total) by mouth daily. Swallow whole. 30 tablet 12  . calcium carbonate (OS-CAL - DOSED IN MG OF ELEMENTAL CALCIUM) 1250 (500 Ca) MG tablet Take 1 tablet by mouth once a week.     . cholecalciferol (VITAMIN D) 1000 units tablet Take 1,000 Units by mouth daily.    . ciprofloxacin (CIPRO) 250 MG tablet Take 1 tablet (250 mg total) by mouth 2 (two) times daily. 14 tablet 0  . Clobetasol Prop Emollient Base 0.05 % emollient cream Apply 1 application topically 2 (two) times daily. 60 g 5  . furosemide (LASIX) 40 MG tablet TAKE 60 MG (1 AND 1/2 TABLETS) DAILY AS NEEDED FOR WEIGHT GAIN OVER 207 LBS (Patient taking differently: 80 mg. 60mg  then switch to 80 mg) 90 tablet 3  . HYDROcodone-acetaminophen (NORCO) 5-325 MG tablet Take 1 tablet by mouth every 6 (six) hours as needed for moderate pain. 30 tablet 0  . levothyroxine (SYNTHROID) 50 MCG tablet TAKE ONE (1) TABLET EACH DAY 90 tablet 0  . Multiple Vitamin (MULTIVITAMIN) tablet Take 1 tablet by mouth 2 (two) times a week.     Marland Kitchen omeprazole-sodium bicarbonate (ZEGERID) 40-1100 MG capsule Take 1 capsule by mouth  daily before breakfast. (Patient taking differently: Take 1 capsule by mouth daily as needed (for indogestion). )    . potassium chloride SA (KLOR-CON) 20 MEQ tablet TAKE ONE (1) TABLET EACH DAY 90 tablet 3  . sertraline (ZOLOFT) 100 MG tablet Take 1 tablet (100 mg total) by mouth daily. 30 tablet 5  . telmisartan (MICARDIS) 40 MG tablet Take 40 mg by mouth daily.     No current facility-administered medications for this visit.     Past Surgical History:  Procedure Laterality Date  . ABDOMINAL HYSTERECTOMY    . CHOLECYSTECTOMY N/A 11/04/2013   Procedure: LAPAROSCOPIC CHOLECYSTECTOMY WITH INTRAOPERATIVE CHOLANGIOGRAM;  Surgeon: Imogene Burn. Georgette Dover, MD;  Location: [employer] [facility];  Service: General;  Laterality: N/A;  . COLONOSCOPY    . FRACTURE SURGERY     pins removed from  Hip surgery  . HIP FRACTURE SURGERY  1990    pins removed in 1991  . PARTIAL HYSTERECTOMY  1987  . SPINAL FUSION    . UPPER GASTROINTESTINAL ENDOSCOPY       Allergies  Allergen Reactions  . Celebrex [Celecoxib] Swelling  . Keflet [Cephalexin] Swelling  . Penicillins Hives and Swelling    DID THE REACTION INVOLVE: Swelling of the face/tongue/throat, SOB, or low BP? Yes-swelling-hives Sudden or severe rash/hives, skin peeling, or the inside of the mouth or nose? Unknown Did it require medical treatment? Unknown When did it last happen?over 10 years If all above answers are "NO", may proceed with cephalosporin use.   . Sulfa Antibiotics Swelling  . Kenalog [Triamcinolone Acetonide]     unknown  . Latex   . Lisinopril Other (See Comments)    unknown  . Mobic [Meloxicam]     SWELLING      Family History  Problem Relation Age of Onset  . Arthritis Other   . Heart disease Other   . Cancer Other   . Diabetes Other   . OCD Other      Social History Ms. Robling reports that she has never smoked. She has never used smokeless tobacco. Ms. Mealy reports no history of alcohol use.   Review of Systems CONSTITUTIONAL: No weight loss, fever, chills, weakness or fatigue.  HEENT: Eyes: No visual loss, blurred vision, double vision or yellow sclerae.No hearing loss, sneezing, congestion, runny nose or sore throat.  SKIN: No rash or itching.  CARDIOVASCULAR: per hpi RESPIRATORY: No shortness of breath, cough or sputum.  GASTROINTESTINAL: No anorexia, nausea, vomiting or diarrhea. No abdominal pain or blood.  GENITOURINARY: No burning on urination, no polyuria NEUROLOGICAL: No headache, dizziness, syncope, paralysis, ataxia, numbness or tingling in the extremities. No change in bowel or bladder control.  MUSCULOSKELETAL: No muscle, back pain, joint pain or stiffness.  LYMPHATICS: No enlarged nodes. No history of splenectomy.  PSYCHIATRIC: No history of depression or anxiety.    ENDOCRINOLOGIC: No reports of sweating, cold or heat intolerance. No polyuria or polydipsia.  Marland Kitchen   Physical Examination Today's Vitals   08/02/19 1418  BP: 138/78  Pulse: (!) 58  SpO2: 97%  Weight: 213 lb 6.4 oz (96.8 kg)  Height: 5\' 4"  (1.626 m)   Body mass index is 36.63 kg/m.  Gen: resting comfortably, no acute distress HEENT: no scleral icterus, pupils equal round and reactive, no palptable cervical adenopathy,  CV: RRR, no m/r/g, no jvd Resp: Clear to auscultation bilaterally GI: abdomen is soft, non-tender, non-distended, normal bowel sounds, no hepatosplenomegaly MSK: extremities are warm, 2+ bilateral LE edema Skin: warm, no rash  Neuro:  no focal deficits Psych: appropriate affect   Diagnostic Studies  09/2017 echo Study Conclusions  - Left ventricle: The cavity size was normal. Wall thickness was increased in a pattern of mild LVH. Systolic function was normal. The estimated ejection fraction was in the range of 60% to 65%. Wall motion was normal; there were no regional wall motion abnormalities. - Aortic valve: Mildly calcified annulus. Trileaflet; mildly thickened leaflets. Mean gradient (S): 8 mm Hg. Valve area (VTI): 2.13 cm^2. Valve area (Vmax): 1.86 cm^2. Valve area (Vmean): 2.05 cm^2. - Left atrium: The atrium was severely dilated. - Right atrium: The atrium was mildly dilated. - Technically adequate study.   Assessment and Plan  1. Leg edema/Chronic diastolic HF - supsect combined diastolic HF and lymphedema - careful diuretic dosing given prior AKI with higher doses. Swelling will only respond so much to diuretic as she also has significant lymphedema - has not been able to establish at lymphedema clinic due to transportation limitations - continue current regimen. Pending upcoming labs with pcp may increase for a few days given she has some increased LE edema.   2. Hypokalemia - she did not f/u with pcp after recent labs, we  discussed she needs to f/u for repeat labs ASAP. She will call us when scheduled  Have her come back about1 week after pcp visit with labs. Likely temporarily increase her diuretic for some increased swelling.  - EKG shows SR, 2 isolated PVCs  Arnoldo Lenis, M.D.

## 2019-08-03 ENCOUNTER — Telehealth: Payer: Self-pay | Admitting: *Deleted

## 2019-08-03 DIAGNOSIS — E876 Hypokalemia: Secondary | ICD-10-CM

## 2019-08-03 DIAGNOSIS — R600 Localized edema: Secondary | ICD-10-CM

## 2019-08-03 DIAGNOSIS — I10 Essential (primary) hypertension: Secondary | ICD-10-CM

## 2019-08-03 DIAGNOSIS — I5032 Chronic diastolic (congestive) heart failure: Secondary | ICD-10-CM

## 2019-08-03 DIAGNOSIS — D72829 Elevated white blood cell count, unspecified: Secondary | ICD-10-CM

## 2019-08-03 NOTE — Telephone Encounter (Signed)
Patient was instructed to contact our office with her PCP appointment information. Patient says she checked with her PCP and does not need an appointment with PCP at this time. Patient is requesting that lab work be ordered by Dr. Harl Bowie for [facility] . Patient is also asking about her follow up with Dr. Harl Bowie.

## 2019-08-03 NOTE — Telephone Encounter (Signed)
Can we order a bmet, mg, cbc. Virtual f/u 3 weeks    Zandra Abts MD

## 2019-08-03 NOTE — Telephone Encounter (Signed)
Patient informed. Says she will try to do lab work tomorrow at [facility]

## 2019-08-06 ENCOUNTER — Other Ambulatory Visit: Payer: Self-pay

## 2019-08-06 ENCOUNTER — Other Ambulatory Visit: Payer: Medicare Other

## 2019-08-06 DIAGNOSIS — I10 Essential (primary) hypertension: Secondary | ICD-10-CM | POA: Diagnosis not present

## 2019-08-06 DIAGNOSIS — D72829 Elevated white blood cell count, unspecified: Secondary | ICD-10-CM | POA: Diagnosis not present

## 2019-08-06 DIAGNOSIS — R600 Localized edema: Secondary | ICD-10-CM | POA: Diagnosis not present

## 2019-08-06 DIAGNOSIS — E876 Hypokalemia: Secondary | ICD-10-CM | POA: Diagnosis not present

## 2019-08-06 DIAGNOSIS — I5032 Chronic diastolic (congestive) heart failure: Secondary | ICD-10-CM | POA: Diagnosis not present

## 2019-08-09 ENCOUNTER — Telehealth: Payer: Self-pay | Admitting: Cardiology

## 2019-08-09 NOTE — Telephone Encounter (Signed)
Asking about lab results. Stated she did them last week

## 2019-08-10 NOTE — Telephone Encounter (Signed)
[employer] and spoke with [employer]. Per Janett Billow, a BMP only was done on 08/06/19. Says she will fax result to our office.

## 2019-08-10 NOTE — Telephone Encounter (Signed)
Pt had labs done at [facility] on Friday labs have been requested

## 2019-08-11 ENCOUNTER — Telehealth: Payer: Self-pay | Admitting: Cardiology

## 2019-08-11 DIAGNOSIS — I10 Essential (primary) hypertension: Secondary | ICD-10-CM

## 2019-08-11 NOTE — Telephone Encounter (Signed)
Labs are scanned into Epic

## 2019-08-11 NOTE — Telephone Encounter (Signed)
Asking if we have received blood work

## 2019-08-12 NOTE — Telephone Encounter (Signed)
Potassium just a little low, verify taking KCl 51mEq daily, if so increase to 76mEq daily. Kidneys look good, better than they have in some time. Clarify taking lasix 60mg  daily, if so would start alternating days taking 80mg  and the next day taking  60mg . Repeat BMET/Mg in 2 weeks   Zandra Abts MD

## 2019-08-12 NOTE — Telephone Encounter (Signed)
Pt voiced understanding of med changes verified potassium 20 mew and will increase to 40 meq - has upcoming appt with pcp in 2 weeks and will have labs done at that time at [facility] - updated medication list and lab orders placed

## 2019-08-20 DIAGNOSIS — M170 Bilateral primary osteoarthritis of knee: Secondary | ICD-10-CM | POA: Diagnosis not present

## 2019-08-20 DIAGNOSIS — M5410 Radiculopathy, site unspecified: Secondary | ICD-10-CM | POA: Diagnosis not present

## 2019-08-25 ENCOUNTER — Telehealth (INDEPENDENT_AMBULATORY_CARE_PROVIDER_SITE_OTHER): Payer: Medicare Other | Admitting: Cardiology

## 2019-08-25 ENCOUNTER — Encounter: Payer: Self-pay | Admitting: Cardiology

## 2019-08-25 VITALS — Ht 64.0 in | Wt 204.0 lb

## 2019-08-25 DIAGNOSIS — R600 Localized edema: Secondary | ICD-10-CM

## 2019-08-25 DIAGNOSIS — I5032 Chronic diastolic (congestive) heart failure: Secondary | ICD-10-CM

## 2019-08-25 NOTE — Progress Notes (Signed)
Virtual Visit via Telephone Note   This visit type was conducted due to national recommendations for restrictions regarding the COVID-19 Pandemic (e.g. social distancing) in an effort to limit this patient's exposure and mitigate transmission in our community.  Due to her co-morbid illnesses, this patient is at least at moderate risk for complications without adequate follow up.  This format is felt to be most appropriate for this patient at this time.  The patient did not have access to video technology/had technical difficulties with video requiring transitioning to audio format only (telephone).  All issues noted in this document were discussed and addressed.  No physical exam could be performed with this format.  Please refer to the patient's chart for her  consent to telehealth for [facility].   The patient was identified using 2 identifiers.  Date:  08/25/2019   ID:  Cindy, Saunders [birth year], MRN [id number]  Patient Location: Home Provider Location: Office  PCP:  No primary care provider on file.  Cardiologist:  Carlyle Dolly, MD  Electrophysiologist:  None   Evaluation Performed:  Follow-Up Visit  Chief Complaint:  Follow up  History of Present Illness:    [name] is a 74 y.o. female seen today for follow up of the following medical problems.  1. Leg edema/Chronic diastolic HF  -12/6576 LE venous US [place]: no DVT   09/2017 echo LVEF 46-96%, no WMAs,Diastolic function is abnormal, indeterminant grade  - last visit we increased lasix to 80mg  alternating days with 60mg  - she has instead been taking 60mg  alternating 40mg , she felt like 80mg  was too strong - weight down from 207 to 204 lbs. As recently as Jan 2021 with her pcp she was 221 lbs.      The patient does not have symptoms concerning for COVID-19 infection (fever, chills, cough, or new shortness of breath).    Past Medical History:  Diagnosis Date  . Allergy   . Bronchitis, chronic  ([place])   . Chronic anxiety   . Complication of anesthesia    hard to wake up  . COPD (chronic obstructive pulmonary disease) ([place])   . Depression   . Esophageal reflux   . Headache(784.0)   . Hyperlipidemia   . Hypertension   . Hypothyroidism   . Obesity   . Osteoporosis   . Overactive bladder   . PVC's (premature ventricular contractions)   . Thyroid disease   . Vitamin D deficiency disease    Past Surgical History:  Procedure Laterality Date  . ABDOMINAL HYSTERECTOMY    . CHOLECYSTECTOMY N/A 11/04/2013   Procedure: LAPAROSCOPIC CHOLECYSTECTOMY WITH INTRAOPERATIVE CHOLANGIOGRAM;  Surgeon: Imogene Burn. Georgette Dover, MD;  Location: [facility];  Service: General;  Laterality: N/A;  . COLONOSCOPY    . FRACTURE SURGERY     pins removed from Hip surgery  . HIP FRACTURE SURGERY  1990    pins removed in 1991  . PARTIAL HYSTERECTOMY  1987  . SPINAL FUSION    . UPPER GASTROINTESTINAL ENDOSCOPY       No outpatient medications have been marked as taking for the 08/25/19 encounter (Appointment) with Arnoldo Lenis, MD.     Allergies:   Celebrex [celecoxib], Keflet [cephalexin], Penicillins, Sulfa antibiotics, Kenalog [triamcinolone acetonide], Latex, Lisinopril, and Mobic [meloxicam]   Social History   Tobacco Use  . Smoking status: Never Smoker  . Smokeless tobacco: Never Used  Substance Use Topics  . Alcohol use: No  . Drug use: No     Family  Hx: The patient's family history includes Arthritis in an other family member; Cancer in an other family member; Diabetes in an other family member; Heart disease in an other family member; OCD in an other family member.  ROS:   Please see the history of present illness.     All other systems reviewed and are negative.   Prior CV studies:   The following studies were reviewed today:  09/2017 echo Study Conclusions  - Left ventricle: The cavity size was normal. Wall thickness was increased in a pattern of mild LVH. Systolic function was  normal. The estimated ejection fraction was in the range of 60% to 65%. Wall motion was normal; there were no regional wall motion abnormalities. - Aortic valve: Mildly calcified annulus. Trileaflet; mildly thickened leaflets. Mean gradient (S): 8 mm Hg. Valve area (VTI): 2.13 cm^2. Valve area (Vmax): 1.86 cm^2. Valve area (Vmean): 2.05 cm^2. - Left atrium: The atrium was severely dilated. - Right atrium: The atrium was mildly dilated. - Technically adequate study.  Labs/Other Tests and Data Reviewed:    EKG:  No ECG reviewed.  Recent Labs: 05/25/2019: Magnesium 1.7 06/01/2019: ALT 28; BUN 21; Creatinine, Ser 1.04; Hemoglobin 12.0; Platelets 453; Potassium 2.8; Sodium 141; TSH 1.520   Recent Lipid Panel Lab Results  Component Value Date/Time   CHOL 139 06/01/2019 03:17 PM   TRIG 171 (H) 06/01/2019 03:17 PM   HDL 65 06/01/2019 03:17 PM   CHOLHDL 2.1 06/01/2019 03:17 PM   LDLCALC 46 06/01/2019 03:17 PM    Wt Readings from Last 3 Encounters:  08/02/19 213 lb 6.4 oz (96.8 kg)  06/01/19 221 lb 3.2 oz (100.3 kg)  05/25/19 218 lb 6.4 oz (99.1 kg)     Objective:    Vital Signs:   Today's Vitals   08/25/19 1306  Weight: 204 lb (92.5 kg)  Height: 5\' 4"  (1.626 m)   Body mass index is 35.02 kg/m. Normal affect. Normal speech pattern and tone. Comfortabel, no apparent distress. No audible signs of SOB or wheezing.   ASSESSMENT & PLAN:    1. Leg edema/Chronic diastolic HF - supsect combined diastolic HF and lymphedema -careful diuretic dosing given prior AKI with higher doses. Swelling will only respond so much to diuretic as she also has significant lymphedema - has not been able to establish at lymphedema clinic due to transportation limitations  - weight down from 221 to 204 since January, edema improving per her report - repeat BMET/Mg - conitnue lasix 60mg  alternating with 40mg , will take 80mg  if needed.     COVID-19 Education: The signs and symptoms of  COVID-19 were discussed with the patient and how to seek care for testing (follow up with PCP or arrange E-visit).  The importance of social distancing was discussed today.  Time:   Today, I have spent 20 minutes with the patient with telehealth technology discussing the above problems.     Medication Adjustments/Labs and Tests Ordered: Current medicines are reviewed at length with the patient today.  Concerns regarding medicines are outlined above.   Tests Ordered: No orders of the defined types were placed in this encounter.   Medication Changes: No orders of the defined types were placed in this encounter.   Follow Up:  In Person in 4 month(s)  Signed, Carlyle Dolly, MD  08/25/2019 12:30 PM    [place]

## 2019-08-25 NOTE — Patient Instructions (Signed)
Your physician recommends that you schedule a follow-up appointment in: [place]  Your physician recommends that you continue on your current medications as directed. Please refer to the Current Medication list given to you today.  Thank you for choosing [facility]!!

## 2019-08-30 ENCOUNTER — Ambulatory Visit: Payer: Medicare Other | Admitting: Physician Assistant

## 2019-08-31 ENCOUNTER — Telehealth: Payer: Self-pay | Admitting: Family Medicine

## 2019-08-31 ENCOUNTER — Encounter: Payer: Self-pay | Admitting: Family Medicine

## 2019-08-31 ENCOUNTER — Other Ambulatory Visit: Payer: Self-pay

## 2019-08-31 ENCOUNTER — Ambulatory Visit (INDEPENDENT_AMBULATORY_CARE_PROVIDER_SITE_OTHER): Payer: Medicare Other | Admitting: Family Medicine

## 2019-08-31 VITALS — BP 106/60 | HR 52 | Temp 98.4°F | Ht 63.0 in | Wt 205.0 lb

## 2019-08-31 DIAGNOSIS — F419 Anxiety disorder, unspecified: Secondary | ICD-10-CM

## 2019-08-31 DIAGNOSIS — E039 Hypothyroidism, unspecified: Secondary | ICD-10-CM | POA: Diagnosis not present

## 2019-08-31 DIAGNOSIS — F332 Major depressive disorder, recurrent severe without psychotic features: Secondary | ICD-10-CM | POA: Diagnosis not present

## 2019-08-31 DIAGNOSIS — E876 Hypokalemia: Secondary | ICD-10-CM

## 2019-08-31 DIAGNOSIS — Z79899 Other long term (current) drug therapy: Secondary | ICD-10-CM

## 2019-08-31 DIAGNOSIS — E559 Vitamin D deficiency, unspecified: Secondary | ICD-10-CM

## 2019-08-31 DIAGNOSIS — D72829 Elevated white blood cell count, unspecified: Secondary | ICD-10-CM

## 2019-08-31 MED ORDER — SERTRALINE HCL 100 MG PO TABS: 100.0000 mg | ORAL_TABLET | Freq: Every day | ORAL | 3 refills | Status: DC

## 2019-08-31 MED ORDER — ALPRAZOLAM 0.5 MG PO TABS: 0.5000 mg | ORAL_TABLET | Freq: Three times a day (TID) | ORAL | 2 refills | Status: DC | PRN

## 2019-08-31 MED ORDER — LEVOTHYROXINE SODIUM 50 MCG PO TABS: ORAL_TABLET | ORAL | 1 refills | Status: DC

## 2019-08-31 MED ORDER — TELMISARTAN 40 MG PO TABS: 40.0000 mg | ORAL_TABLET | Freq: Every day | ORAL | 0 refills | Status: DC

## 2019-08-31 NOTE — Progress Notes (Signed)
Subjective: CC:est care, thyroid disorder, GAD w/ panic PCP: Cindy Norlander, DO IWP:[id number] [name] is a 74 y.o. female presenting to clinic today for:  1.  Generalized anxiety disorder with panic, depressive disorder Patient reports longstanding history of depression and anxiety.  No history of hospitalizations for mental health.  No history of SI or HI.  She does continue to have anxiety symptoms despite use of Zoloft 100 mg daily and alprazolam 0.5 mg up to 3 times daily.  Denies any excessive daytime sleepiness, falls, mental status changes including memory loss.  She uses a walker at baseline for ambulation.  2.  Thyroid disorder Patient reports symptoms were found incidentally on labs.  No history of surgery or radiation to the neck.  No family history of thyroid disorder.  She reports compliance with Synthroid 50 mcg daily.  She does have chronic diarrhea which onset after she had her gallbladder removed last year.  She notes this is typically a watery diarrhea.  No blood.  She has not yet set up an appointment with her gastroenterologist with plans to do so soon about this.  Denies any nausea, vomiting.  She has good appetite but notes that everything she eats "run through her".  Does not report any tremors.  She does report anxiety as above.  No heart palpitations.  3.  Hypertension/chronic diastolic heart failure Patient is followed by Dr. Harl Bowie for this issue.  She is prescribed Micardis 40 mg daily and Lasix 40 alternating with 60 mg daily.  She takes a potassium pill as well.  No chest pain, shortness of breath, cramping.  She had labs obtained earlier this year but notes that she was post to have them repeated.  These need to be CCed to Dr. Harl Bowie.   ROS: Per HPI  Allergies  Allergen Reactions  . Celebrex [Celecoxib] Swelling  . Keflet [Cephalexin] Swelling  . Penicillins Hives and Swelling    DID THE REACTION INVOLVE: Swelling of the face/tongue/throat, SOB, or low BP?  Yes-swelling-hives Sudden or severe rash/hives, skin peeling, or the inside of the mouth or nose? Unknown Did it require medical treatment? Unknown When did it last happen?over 10 years If all above answers are "NO", may proceed with cephalosporin use.   . Sulfa Antibiotics Swelling  . Kenalog [Triamcinolone Acetonide]     unknown  . Latex   . Lisinopril Other (See Comments)    unknown  . Mobic [Meloxicam]     SWELLING   Past Medical History:  Diagnosis Date  . Allergy   . Bronchitis, chronic ([place])   . Chronic anxiety   . Complication of anesthesia    hard to wake up  . COPD (chronic obstructive pulmonary disease) ([place])   . Depression   . Esophageal reflux   . Headache(784.0)   . Hyperlipidemia   . Hypertension   . Hypothyroidism   . Obesity   . Osteoporosis   . Overactive bladder   . PVC's (premature ventricular contractions)   . Thyroid disease   . Vitamin D deficiency disease     Current Outpatient Medications:  .  acetaminophen (TYLENOL) 650 MG CR tablet, Take 650 mg by mouth every 8 (eight) hours as needed for pain., Disp: , Rfl:  .  ALPRAZolam (XANAX) 0.5 MG tablet, TAKE 1 TABLET 3 TIMES DAILY AS NEEDED FOR ANXIETY, Disp: 90 tablet, Rfl: 5 .  aspirin 81 MG EC tablet, Take 1 tablet (81 mg total) by mouth daily. Swallow whole., Disp: 30 tablet,  Rfl: 12 .  calcium carbonate (OS-CAL - DOSED IN MG OF ELEMENTAL CALCIUM) 1250 (500 Ca) MG tablet, Take 1 tablet by mouth once a week. , Disp: , Rfl:  .  cholecalciferol (VITAMIN D) 1000 units tablet, Take 1,000 Units by mouth daily., Disp: , Rfl:  .  Clobetasol Prop Emollient Base 0.05 % emollient cream, Apply 1 application topically 2 (two) times daily., Disp: 60 g, Rfl: 5 .  furosemide (LASIX) 40 MG tablet, Take by mouth. Take 60 MG EVERY OTHER DAY ALTERNATING WITH 80 MG EVERY OTHER, Disp: , Rfl:  .  HYDROcodone-acetaminophen (NORCO) 5-325 MG tablet, Take 1 tablet by mouth every 6 (six) hours as needed for moderate  pain., Disp: 30 tablet, Rfl: 0 .  levothyroxine (SYNTHROID) 50 MCG tablet, TAKE ONE (1) TABLET EACH DAY, Disp: 90 tablet, Rfl: 0 .  Multiple Vitamin (MULTIVITAMIN) tablet, Take 1 tablet by mouth 2 (two) times a week. , Disp: , Rfl:  .  omeprazole-sodium bicarbonate (ZEGERID) 40-1100 MG capsule, Take 1 capsule by mouth daily before breakfast. (Patient taking differently: Take 1 capsule by mouth daily as needed (for indogestion). ), Disp: , Rfl:  .  potassium chloride (KLOR-CON) 20 MEQ packet, Take 40 mEq by mouth daily., Disp: , Rfl:  .  sertraline (ZOLOFT) 100 MG tablet, Take 1 tablet (100 mg total) by mouth daily., Disp: 30 tablet, Rfl: 5 .  telmisartan (MICARDIS) 40 MG tablet, Take 40 mg by mouth daily., Disp: , Rfl:  Social History   Socioeconomic History  . Marital status: Single    Spouse name: Not on file  . Number of children: 2  . Years of education: Not on file  . Highest education level: Not on file  Occupational History  . Not on file  Tobacco Use  . Smoking status: Never Smoker  . Smokeless tobacco: Never Used  Substance and Sexual Activity  . Alcohol use: No  . Drug use: No  . Sexual activity: Not on file  Other Topics Concern  . Not on file  Social History Narrative  . Not on file   Social Determinants of Health   Financial Resource Strain:   . Difficulty of Paying Living Expenses:   Food Insecurity:   . Worried About Charity fundraiser in the Last Year:   . Arboriculturist in the Last Year:   Transportation Needs:   . Film/video editor (Medical):   Marland Kitchen Lack of Transportation (Non-Medical):   Physical Activity:   . Days of Exercise per Week:   . Minutes of Exercise per Session:   Stress:   . Feeling of Stress :   Social Connections:   . Frequency of Communication with Friends and Family:   . Frequency of Social Gatherings with Friends and Family:   . Attends Religious Services:   . Active Member of Clubs or Organizations:   . Attends Theatre manager Meetings:   Marland Kitchen Marital Status:   Intimate Partner Violence:   . Fear of Current or Ex-Partner:   . Emotionally Abused:   Marland Kitchen Physically Abused:   . Sexually Abused:    Family History  Problem Relation Age of Onset  . Arthritis Other   . Heart disease Other   . Cancer Other   . Diabetes Other   . OCD Other     Objective: Office vital signs reviewed. BP 106/60   Pulse (!) 52   Temp 98.4 F (36.9 C) (Temporal)   Ht 5'  3" (1.6 m)   Wt 205 lb (93 kg)   SpO2 100%   BMI 36.31 kg/m   Physical Examination:  General: Awake, alert, well appearing elderly female.  Appears older than stated age. No acute distress HEENT: Normal; no goiter.  No exophthalmos Cardio: regular rate and rhythm, S1S2 heard, no murmurs appreciated Pulm: clear to auscultation bilaterally, no wheezes, rhonchi or rales; normal work of breathing on room air Extremities: warm, well perfused, No edema, cyanosis or clubbing; +2 pulses bilaterally MSK: Utilizes rolling walker for ambulation Skin: dry; intact; no rashes or lesions; normal temperature Neuro: No tremor Psych: Mood stable, speech somewhat slowed.  Patient is pleasant and interactive.  Thought process is linear.  Depression screen [facility] 2/9 08/31/2019 06/01/2019 05/25/2019  Decreased Interest 3 0 0  Down, Depressed, Hopeless 3 0 0  PHQ - 2 Score 6 0 0  Altered sleeping 0 - -  Tired, decreased energy 2 - -  Change in appetite 0 - -  Feeling bad or failure about yourself  0 - -  Trouble concentrating 1 - -  Moving slowly or fidgety/restless 1 - -  Suicidal thoughts 0 - -  PHQ-9 Score 10 - -  Difficult doing work/chores Somewhat difficult - -   GAD 7 : Generalized Anxiety Score 08/31/2019  Nervous, Anxious, on Edge 3  Control/stop worrying 3  Worry too much - different things 3  Trouble relaxing 1  Restless 0  Easily annoyed or irritable 1  Afraid - awful might happen 1  Total GAD 7 Score 12  Anxiety Difficulty Somewhat difficult    Assessment/ Plan: 74 y.o. female   1. Severe episode of recurrent major depressive disorder, without psychotic features ([place]) Moderately controlled.  I am going to refer her to CCM for counseling services.  She was amenable to this today.  I have renewed her Zoloft 100 mg daily.  May need to consider increasing dose at some point.  I did not make any medication adjustments today since we are new to each other.  I had like to see her back in about 3 months, sooner if needed. - Referral to Chronic Care Management Services - sertraline (ZOLOFT) 100 MG tablet; Take 1 tablet (100 mg total) by mouth daily.  Dispense: 90 tablet; Refill: 3  2. Anxiety I discussed today with her the risks of benzodiazepine use, particularly after age 91.  She seems somewhat surprised by these risks.  I have encouraged her to use the benzodiazepine very sparingly.  Today was predominantly focused on establishing rapport.  We will plan to discuss titration off of the medication next visit.  A controlled substance contract was completed today.  UDS ordered as well compliance with office policy.  The national narcotic database was reviewed and there were no red flags. - ToxASSURE Select 13 (MW), Urine - Referral to Chronic Care Management Services - sertraline (ZOLOFT) 100 MG tablet; Take 1 tablet (100 mg total) by mouth daily.  Dispense: 90 tablet; Refill: 3  3. Controlled substance agreement signed - ToxASSURE Select 13 (MW), Urine  4. Hypokalemia Noted on January labs.  Check BMP, magnesium.  Will CC to Dr. Harl Bowie - Basic Metabolic Panel - Magnesium  5. Acquired hypothyroidism Diarrhea and anxiety.  Check thyroid panel - Thyroid Panel With TSH - levothyroxine (SYNTHROID) 50 MCG tablet; TAKE ONE (1) TABLET EACH DAY  Dispense: 90 tablet; Refill: 1  6. Vitamin D deficiency No recent vitamin D check - VITAMIN D 25 Hydroxy (Vit-D  Deficiency, Fractures)  7. Leukocytosis, unspecified type Uncertain etiology.   Check CBC - CBC with Differential   No orders of the defined types were placed in this encounter.  No orders of the defined types were placed in this encounter.    Cindy Norlander, DO [place] [phone]

## 2019-08-31 NOTE — Addendum Note (Signed)
Addended by: Janora Norlander on: 08/31/2019 12:52 PM   Modules accepted: Orders

## 2019-08-31 NOTE — Chronic Care Management (AMB) (Signed)
  Chronic Care Management   Note  08/31/2019 Name: [name] MRN: [id number] DOB: 1946/04/19  [name] is a 74 y.o. year old female who is a primary care patient of Janora Norlander, DO. I reached out to [employer] by phone today in response to a referral sent by [name]'s PCP, Ronnie Doss DO     [name] was given information about Chronic Care Management services today including:  1. CCM service includes personalized support from designated clinical staff supervised by her physician, including individualized plan of care and coordination with other care providers 2. 24/7 contact phone numbers for assistance for urgent and routine care needs. 3. Service will only be billed when office clinical staff spend 20 minutes or more in a month to coordinate care. 4. Only one practitioner may furnish and bill the service in a calendar month. 5. The patient may stop CCM services at any time (effective at the end of the month) by phone call to the office staff. 6. The patient will be responsible for cost sharing (co-pay) of up to 20% of the service fee (after annual deductible is met).  Patient did not agree to enrollment in care management services and does not wish to consider at this time.  Follow up plan: The care management team is available to follow up with the patient after provider conversation with the patient regarding recommendation for care management engagement and subsequent re-referral to the care management team.   Glenna Durand, LPN Health Advisor, [facility] ??[email or web address] ??[phone]

## 2019-08-31 NOTE — Patient Instructions (Signed)
You had labs performed today.  You will be contacted with the results of the labs once they are available, usually in the next 3 business days for routine lab work.  If you have an active my chart account, they will be released to your MyChart.  If you prefer to have these labs released to you via telephone, please let us know.  Please remember to arrive to your appointment 15 minutes prior to your scheduled slot.  If you are late to future visits I may be unable to accommodate your appointment and you may be asked to reschedule.  We talked about the risk of Xanax including: falls, fractures, early death and dementia today.  Use medication sparingly.  [name] will call you for counseling.  Controlled Substance Guidelines:  1. You cannot get an early refill, even it is lost.  2. You cannot get controlled medications from any other doctor, unless it is the emergency department and related to a new problem or injury.  3. You cannot use alcohol, marijuana, cocaine or any other recreational drugs while using this medication. This is very dangerous.  4. You are willing to have your urine drug tested at each visit.  5. You will not drive while using this medication, because that can put yourself and others in serious danger of an accident. 6. If any medication is stolen, then there must be a police report to verify it, or it cannot be refilled.  7. I will not prescribe these medications for longer than 3 months.  8. You must bring your pill bottle to each visit.  9. You must use the same pharmacy for all refills for the medication, unless you clear it with me beforehand.  10. You cannot share or sell this medication.

## 2019-09-01 ENCOUNTER — Other Ambulatory Visit: Payer: Self-pay | Admitting: Family Medicine

## 2019-09-01 DIAGNOSIS — E559 Vitamin D deficiency, unspecified: Secondary | ICD-10-CM

## 2019-09-01 DIAGNOSIS — N1832 Chronic kidney disease, stage 3b: Secondary | ICD-10-CM

## 2019-09-01 LAB — CBC WITH DIFFERENTIAL/PLATELET
Basophils Absolute: 0 10*3/uL (ref 0.0–0.2)
Basos: 0 %
EOS (ABSOLUTE): 0.2 10*3/uL (ref 0.0–0.4)
Eos: 2 %
Hematocrit: 36.4 % (ref 34.0–46.6)
Hemoglobin: 11.7 g/dL (ref 11.1–15.9)
Immature Grans (Abs): 0.1 10*3/uL (ref 0.0–0.1)
Immature Granulocytes: 1 %
Lymphocytes Absolute: 2.1 10*3/uL (ref 0.7–3.1)
Lymphs: 21 %
MCH: 28.9 pg (ref 26.6–33.0)
MCHC: 32.1 g/dL (ref 31.5–35.7)
MCV: 90 fL (ref 79–97)
Monocytes Absolute: 0.9 10*3/uL (ref 0.1–0.9)
Monocytes: 9 %
Neutrophils Absolute: 6.8 10*3/uL (ref 1.4–7.0)
Neutrophils: 67 %
Platelets: 351 10*3/uL (ref 150–450)
RBC: 4.05 x10E6/uL (ref 3.77–5.28)
RDW: 13 % (ref 11.7–15.4)
WBC: 10 10*3/uL (ref 3.4–10.8)

## 2019-09-01 LAB — VITAMIN D 25 HYDROXY (VIT D DEFICIENCY, FRACTURES): Vit D, 25-Hydroxy: 20.3 ng/mL — ABNORMAL LOW (ref 30.0–100.0)

## 2019-09-01 LAB — BASIC METABOLIC PANEL
BUN/Creatinine Ratio: 16 (ref 12–28)
BUN: 21 mg/dL (ref 8–27)
CO2: 24 mmol/L (ref 20–29)
Calcium: 9.4 mg/dL (ref 8.7–10.3)
Chloride: 99 mmol/L (ref 96–106)
Creatinine, Ser: 1.32 mg/dL — ABNORMAL HIGH (ref 0.57–1.00)
GFR calc Af Amer: 46 mL/min/{1.73_m2} — ABNORMAL LOW (ref >59)
GFR calc non Af Amer: 40 mL/min/{1.73_m2} — ABNORMAL LOW (ref >59)
Glucose: 85 mg/dL (ref 65–99)
Potassium: 4.4 mmol/L (ref 3.5–5.2)
Sodium: 138 mmol/L (ref 134–144)

## 2019-09-01 LAB — THYROID PANEL WITH TSH
Free Thyroxine Index: 3 (ref 1.2–4.9)
T3 Uptake Ratio: 29 % (ref 24–39)
T4, Total: 10.5 ug/dL (ref 4.5–12.0)
TSH: 1.01 u[IU]/mL (ref 0.450–4.500)

## 2019-09-01 LAB — MAGNESIUM: Magnesium: 2.1 mg/dL (ref 1.6–2.3)

## 2019-09-01 MED ORDER — VITAMIN D (ERGOCALCIFEROL) 1.25 MG (50000 UNIT) PO CAPS: 50000.0000 [IU] | ORAL_CAPSULE | ORAL | 0 refills | Status: AC

## 2019-09-03 LAB — TOXASSURE SELECT 13 (MW), URINE

## 2019-09-06 ENCOUNTER — Telehealth: Payer: Self-pay | Admitting: Cardiology

## 2019-09-06 NOTE — Telephone Encounter (Signed)
Patient contacted office to let Dr. Harl Bowie know her lab work is available for review in which was ordered by her family doctor.

## 2019-09-06 NOTE — Telephone Encounter (Signed)
Asking if she needs any more labs and what the results were from August 31, 2019 labs

## 2019-09-08 NOTE — Telephone Encounter (Signed)
Labs show some increased stress on the kidney function, would lower her lasix to 60mg  daily, can take 80mg s if needed if increased swelling  Zandra Abts MD

## 2019-09-08 NOTE — Telephone Encounter (Signed)
Pt voiced understanding - updated medication list 

## 2019-09-30 ENCOUNTER — Other Ambulatory Visit: Payer: Self-pay

## 2019-09-30 ENCOUNTER — Encounter: Payer: Self-pay | Admitting: Nurse Practitioner

## 2019-09-30 ENCOUNTER — Ambulatory Visit (INDEPENDENT_AMBULATORY_CARE_PROVIDER_SITE_OTHER): Payer: Medicare Other | Admitting: Nurse Practitioner

## 2019-09-30 VITALS — BP 126/67 | HR 58 | Temp 96.9°F | Ht 63.0 in | Wt 202.8 lb

## 2019-09-30 DIAGNOSIS — F332 Major depressive disorder, recurrent severe without psychotic features: Secondary | ICD-10-CM

## 2019-09-30 DIAGNOSIS — E7800 Pure hypercholesterolemia, unspecified: Secondary | ICD-10-CM | POA: Diagnosis not present

## 2019-09-30 DIAGNOSIS — J449 Chronic obstructive pulmonary disease, unspecified: Secondary | ICD-10-CM

## 2019-09-30 DIAGNOSIS — D649 Anemia, unspecified: Secondary | ICD-10-CM | POA: Diagnosis not present

## 2019-09-30 DIAGNOSIS — I878 Other specified disorders of veins: Secondary | ICD-10-CM

## 2019-09-30 DIAGNOSIS — F419 Anxiety disorder, unspecified: Secondary | ICD-10-CM

## 2019-09-30 DIAGNOSIS — I10 Essential (primary) hypertension: Secondary | ICD-10-CM

## 2019-09-30 DIAGNOSIS — I872 Venous insufficiency (chronic) (peripheral): Secondary | ICD-10-CM | POA: Insufficient documentation

## 2019-09-30 NOTE — Progress Notes (Signed)
Subjective:    Patient ID: [name], female    DOB: 01-24-1946, 74 y.o.   MRN: [id number]   Chief Complaint: Leg Swelling (Patient states she has been having bilateral lower leg swelling that has been on and off x 1 yr.)    HPI: Patient states she has had more leg swelling in the past few days than she normally does. It is in both legs and she notices it throughout the day, more so when she is up on them. She tries to relieve the pain by elevating her legs but has to dangle them after so long because they begin to ache. She states that the pain in her legs is about a 5/10 in severity.  1. Essential hypertension Patient does not take her blood pressure at home. She denies headaches, dizziness, or chest pain. BP Readings from Last 3 Encounters:  09/30/19 126/67  08/31/19 106/60  08/02/19 138/78     2. Chronic obstructive pulmonary disease, unspecified COPD type ([place]) Patient denies shortness of breath or coughing spells.  3. Anxiety Patient states that her anxiety has been under pretty good control with Xanax. GAD 7 : Generalized Anxiety Score 08/31/2019  Nervous, Anxious, on Edge 3  Control/stop worrying 3  Worry too much - different things 3  Trouble relaxing 1  Restless 0  Easily annoyed or irritable 1  Afraid - awful might happen 1  Total GAD 7 Score 12  Anxiety Difficulty Somewhat difficult    4. Anemia, unspecified type Patient denies any more fatigue than normal. Lab Results  Component Value Date   WBC 10.0 08/31/2019   HGB 11.7 08/31/2019   HCT 36.4 08/31/2019   MCV 90 08/31/2019   PLT 351 08/31/2019     5. Severe episode of recurrent major depressive disorder, without psychotic features ([place]) PHQ9 SCORE ONLY 09/30/2019 08/31/2019 06/01/2019  Score 0 10 0      6. Pure hypercholesterolemia Lab Results  Component Value Date   CHOL 139 06/01/2019   HDL 65 06/01/2019   LDLCALC 46 06/01/2019   TRIG 171 (H) 06/01/2019   CHOLHDL 2.1 06/01/2019   Patient  admits to some compliance with a heart health diet.  New complaints: No new complaints today.   Social history: Patient reports more concerns at home with the care of her mother and daughter.   Controlled substance contract:      Review of Systems  Cardiovascular: Positive for leg swelling.  Skin: Positive for color change.  All other systems reviewed and are negative.      Objective:   Physical Exam Constitutional:      Appearance: Normal appearance.  HENT:     Head: Normocephalic.  Eyes:     Conjunctiva/sclera: Conjunctivae normal.  Cardiovascular:     Rate and Rhythm: Normal rate and regular rhythm.     Pulses: Normal pulses.     Heart sounds: Normal heart sounds.  Pulmonary:     Breath sounds: Normal breath sounds.  Abdominal:     General: Bowel sounds are normal.  Musculoskeletal:     Cervical back: Neck supple.     Right lower leg: Swelling present.     Left lower leg: Swelling present.     Right ankle: Swelling present.     Left ankle: Swelling present.  Skin:    General: Skin is warm and dry.     Findings: Bruising present.          Comments: Dry skin on lower legs  Neurological:     Mental Status: She is oriented to person, place, and time.        Assessment & Plan:  [name] comes in today with chief complaint of Leg Swelling (Patient states she has been having bilateral lower leg swelling that has been on and off x 1 yr.)   Diagnosis and orders addressed:  1. Essential hypertension Low sodium diet  2. Chronic obstructive pulmonary disease, unspecified COPD type ([place]) Avoid allergen  3. Anxiety stress management  4. Anemia, unspecified type Lab to be drawn at next follow up  5. Severe episode of recurrent major depressive disorder, without psychotic features ([place]) Stress management  6. Pure hypercholesterolemia Low fat diet  7. Venous stasis of lower extremity Elevate leg when sitting - Apply unna boot   * patient has  constant diarrhea and he I afraid that he will me her patients and get stool on unna boot. Patient was told to take an imodium AD OTC for the next few days.  Follow up plan: Monday at [place], FNP

## 2019-09-30 NOTE — Patient Instructions (Signed)
Edema  Edema is when you have too much fluid in your body or under your skin. Edema may make your legs, feet, and ankles swell up. Swelling is also common in looser tissues, like around your eyes. This is a common condition. It gets more common as you get older. There are many possible causes of edema. Eating too much salt (sodium) and being on your feet or sitting for a long time can cause edema in your legs, feet, and ankles. Hot weather may make edema worse. Edema is usually painless. Your skin may look swollen or shiny. Follow these instructions at home:  Keep the swollen body part raised (elevated) above the level of your heart when you are sitting or lying down.  Do not sit still or stand for a long time.  Do not wear tight clothes. Do not wear garters on your upper legs.  Exercise your legs. This can help the swelling go down.  Wear elastic bandages or support stockings as told by your doctor.  Eat a low-salt (low-sodium) diet to reduce fluid as told by your doctor.  Depending on the cause of your swelling, you may need to limit how much fluid you drink (fluid restriction).  Take over-the-counter and prescription medicines only as told by your doctor. Contact a doctor if:  Treatment is not working.  You have heart, liver, or kidney disease and have symptoms of edema.  You have sudden and unexplained weight gain. Get help right away if:  You have shortness of breath or chest pain.  You cannot breathe when you lie down.  You have pain, redness, or warmth in the swollen areas.  You have heart, liver, or kidney disease and get edema all of a sudden.  You have a fever and your symptoms get worse all of a sudden. Summary  Edema is when you have too much fluid in your body or under your skin.  Edema may make your legs, feet, and ankles swell up. Swelling is also common in looser tissues, like around your eyes.  Raise (elevate) the swollen body part above the level of your  heart when you are sitting or lying down.  Follow your doctor's instructions about diet and how much fluid you can drink (fluid restriction). This information is not intended to replace advice given to you by your health care provider. Make sure you discuss any questions you have with your health care provider. Document Revised: 05/16/2017 Document Reviewed: 05/31/2016 Elsevier Patient Education  2020 Elsevier Inc.  

## 2019-10-01 ENCOUNTER — Telehealth: Payer: Self-pay | Admitting: Family Medicine

## 2019-10-01 NOTE — Telephone Encounter (Signed)
Seen by MMM for edema in legs yesterday.   Patient having pain and had a few old hydrocodone pills which helped her make it through the night.  Could she possibly get a few sent in to help until pain eases off?

## 2019-10-01 NOTE — Telephone Encounter (Signed)
If medication approved , please send to [facility] [place].

## 2019-10-01 NOTE — Telephone Encounter (Signed)
Orry but cannot do pain medication. Will have to do extra strength tylenol

## 2019-10-01 NOTE — Telephone Encounter (Signed)
Aware of provider's suggestion to take tylenol.

## 2019-10-04 ENCOUNTER — Other Ambulatory Visit: Payer: Self-pay

## 2019-10-04 ENCOUNTER — Ambulatory Visit (INDEPENDENT_AMBULATORY_CARE_PROVIDER_SITE_OTHER): Payer: Medicare Other | Admitting: Nurse Practitioner

## 2019-10-04 ENCOUNTER — Encounter: Payer: Self-pay | Admitting: Nurse Practitioner

## 2019-10-04 DIAGNOSIS — I872 Venous insufficiency (chronic) (peripheral): Secondary | ICD-10-CM | POA: Diagnosis not present

## 2019-10-04 NOTE — Progress Notes (Signed)
Acute Office Visit  Subjective:    Patient ID: [name], female    DOB: 12-02-1945, 74 y.o.   MRN: [id number]  Chief Complaint  Patient presents with  . Recheck unna boots    HPI Patient is in today for a recheck of her leg swelling after unna boots were applied on 09/30/2019.  Chief Complaint: Recheck unna boots    HPI:  1. Venous (peripheral) insufficiency Decrease in leg swelling today. She states she was afraid they were beginning to get infected but her legs feel cool to the touch, are not painful, and no other signs of infection are present.. Some pitting ankle edema still present. Patient states she doesn't want her legs wrapped again but will do whatever needs to be done to help her.    Outpatient Encounter Medications as of 10/04/2019  Medication Sig  . acetaminophen (TYLENOL) 650 MG CR tablet Take 650 mg by mouth every 8 (eight) hours as needed for pain.  Marland Kitchen ALPRAZolam (XANAX) 0.5 MG tablet Take 1 tablet (0.5 mg total) by mouth 3 (three) times daily as needed for anxiety (use SPARINGLY).  Marland Kitchen aspirin 81 MG EC tablet Take 1 tablet (81 mg total) by mouth daily. Swallow whole.  . calcium carbonate (OS-CAL - DOSED IN MG OF ELEMENTAL CALCIUM) 1250 (500 Ca) MG tablet Take 1 tablet by mouth once a week.   . cholecalciferol (VITAMIN D) 1000 units tablet Take 1,000 Units by mouth daily.  . Clobetasol Prop Emollient Base 0.05 % emollient cream Apply 1 application topically 2 (two) times daily.  . furosemide (LASIX) 40 MG tablet Take 60 mg by mouth daily. CAN TAKE 80 MG FOR SWELLING  . levothyroxine (SYNTHROID) 50 MCG tablet TAKE ONE (1) TABLET EACH DAY  . Multiple Vitamin (MULTIVITAMIN) tablet Take 1 tablet by mouth 2 (two) times a week.   Marland Kitchen omeprazole-sodium bicarbonate (ZEGERID) 40-1100 MG capsule Take 1 capsule by mouth daily before breakfast.  . potassium chloride (KLOR-CON) 20 MEQ packet Take 40 mEq by mouth daily.  . sertraline (ZOLOFT) 100 MG tablet Take 1 tablet (100 mg  total) by mouth daily.  Marland Kitchen telmisartan (MICARDIS) 40 MG tablet Take 1 tablet (40 mg total) by mouth daily.  . Vitamin D, Ergocalciferol, (DRISDOL) 1.25 MG (50000 UNIT) CAPS capsule Take 1 capsule (50,000 Units total) by mouth every 7 (seven) days for 8 doses. Then start OTC Vit D 800IU daily   No facility-administered encounter medications on file as of 10/04/2019.     New complaints: Patient has no new complaints today.  Social history: Patient denies new social complaints.  Controlled substance contract:      Outpatient Medications Prior to Visit  Medication Sig Dispense Refill  . acetaminophen (TYLENOL) 650 MG CR tablet Take 650 mg by mouth every 8 (eight) hours as needed for pain.    Marland Kitchen ALPRAZolam (XANAX) 0.5 MG tablet Take 1 tablet (0.5 mg total) by mouth 3 (three) times daily as needed for anxiety (use SPARINGLY). 90 tablet 2  . aspirin 81 MG EC tablet Take 1 tablet (81 mg total) by mouth daily. Swallow whole. 30 tablet 12  . calcium carbonate (OS-CAL - DOSED IN MG OF ELEMENTAL CALCIUM) 1250 (500 Ca) MG tablet Take 1 tablet by mouth once a week.     . cholecalciferol (VITAMIN D) 1000 units tablet Take 1,000 Units by mouth daily.    . Clobetasol Prop Emollient Base 0.05 % emollient cream Apply 1 application topically 2 (two) times daily. [place]  g 5  . furosemide (LASIX) 40 MG tablet Take 60 mg by mouth daily. CAN TAKE 80 MG FOR SWELLING    . levothyroxine (SYNTHROID) 50 MCG tablet TAKE ONE (1) TABLET EACH DAY 90 tablet 1  . Multiple Vitamin (MULTIVITAMIN) tablet Take 1 tablet by mouth 2 (two) times a week.     Marland Kitchen omeprazole-sodium bicarbonate (ZEGERID) 40-1100 MG capsule Take 1 capsule by mouth daily before breakfast.    . potassium chloride (KLOR-CON) 20 MEQ packet Take 40 mEq by mouth daily.    . sertraline (ZOLOFT) 100 MG tablet Take 1 tablet (100 mg total) by mouth daily. 90 tablet 3  . telmisartan (MICARDIS) 40 MG tablet Take 1 tablet (40 mg total) by mouth daily. 90 tablet 0  .  Vitamin D, Ergocalciferol, (DRISDOL) 1.25 MG (50000 UNIT) CAPS capsule Take 1 capsule (50,000 Units total) by mouth every 7 (seven) days for 8 doses. Then start OTC Vit D 800IU daily 8 capsule 0   No facility-administered medications prior to visit.      Review of Systems  Musculoskeletal: Positive for arthralgias.  All other systems reviewed and are negative.      Objective:    Physical Exam Constitutional:      Appearance: Normal appearance.  HENT:     Head: Normocephalic.  Eyes:     Conjunctiva/sclera: Conjunctivae normal.  Cardiovascular:     Rate and Rhythm: Normal rate and regular rhythm.     Heart sounds: Normal heart sounds.  Pulmonary:     Breath sounds: Normal breath sounds.  Abdominal:     General: Bowel sounds are normal.  Musculoskeletal:     Cervical back: Normal range of motion.     Right lower leg: Swelling present.     Left lower leg: Swelling present.     Right ankle: Swelling present.     Left ankle: Swelling present.     Comments: Non pitting leg edema. +1 pitting ankle edema bilaterally.  Skin:    General: Skin is warm and dry.  Neurological:     Mental Status: She is alert and oriented to person, place, and time.  Psychiatric:        Mood and Affect: Mood normal.        Behavior: Behavior normal.     BP 130/77   Pulse (!) 52   Temp (!) 97.2 F (36.2 C) (Temporal)   Resp 20   Ht 7' (2.134 m)   Wt 200 lb (90.7 kg)   SpO2 98%   BMI 19.93 kg/m  Wt Readings from Last 3 Encounters:  10/04/19 200 lb (90.7 kg)  09/30/19 202 lb 12.8 oz (92 kg)  08/31/19 205 lb (93 kg)             Assessment & Plan:    [name] in today with chief complaint of Recheck unna boots   1. Venous (peripheral) insufficiency Continue to wear compression hose daily Elevate legs when sitting Continue all meds Recheck in 3 month    The above assessment and management plan was discussed with the patient. The patient verbalized understanding of  and has agreed to the management plan. Patient is aware to call the clinic if symptoms persist or worsen. Patient is aware when to return to the clinic for a follow-up visit. Patient educated on when it is appropriate to go to the emergency department.   Mary-Margaret Hassell Done, FNP

## 2019-10-04 NOTE — Patient Instructions (Signed)

## 2019-11-22 DIAGNOSIS — M170 Bilateral primary osteoarthritis of knee: Secondary | ICD-10-CM | POA: Diagnosis not present

## 2019-11-23 ENCOUNTER — Other Ambulatory Visit: Payer: Self-pay | Admitting: Orthopedic Surgery

## 2019-11-23 DIAGNOSIS — M5410 Radiculopathy, site unspecified: Secondary | ICD-10-CM

## 2019-12-14 ENCOUNTER — Other Ambulatory Visit: Payer: Self-pay | Admitting: Family Medicine

## 2019-12-23 DIAGNOSIS — M545 Low back pain: Secondary | ICD-10-CM | POA: Diagnosis not present

## 2019-12-23 DIAGNOSIS — M5417 Radiculopathy, lumbosacral region: Secondary | ICD-10-CM | POA: Diagnosis not present

## 2019-12-24 ENCOUNTER — Ambulatory Visit: Payer: Medicare Other | Admitting: Cardiology

## 2019-12-24 ENCOUNTER — Encounter: Payer: Self-pay | Admitting: Cardiology

## 2019-12-27 ENCOUNTER — Ambulatory Visit: Payer: Medicare Other | Admitting: Family Medicine

## 2019-12-27 ENCOUNTER — Ambulatory Visit
Admission: RE | Admit: 2019-12-27 | Discharge: 2019-12-27 | Disposition: A | Discharge status: Home / Self Care | Payer: Medicare Other | Source: Ambulatory Visit | Attending: Orthopedic Surgery | Admitting: Orthopedic Surgery

## 2019-12-27 ENCOUNTER — Other Ambulatory Visit: Payer: Self-pay

## 2019-12-27 DIAGNOSIS — M5410 Radiculopathy, site unspecified: Secondary | ICD-10-CM

## 2019-12-27 DIAGNOSIS — M48061 Spinal stenosis, lumbar region without neurogenic claudication: Secondary | ICD-10-CM | POA: Diagnosis not present

## 2019-12-31 DIAGNOSIS — M48062 Spinal stenosis, lumbar region with neurogenic claudication: Secondary | ICD-10-CM | POA: Diagnosis not present

## 2019-12-31 DIAGNOSIS — M545 Low back pain: Secondary | ICD-10-CM | POA: Diagnosis not present

## 2019-12-31 DIAGNOSIS — M5417 Radiculopathy, lumbosacral region: Secondary | ICD-10-CM | POA: Diagnosis not present

## 2020-01-07 DIAGNOSIS — M5136 Other intervertebral disc degeneration, lumbar region: Secondary | ICD-10-CM | POA: Insufficient documentation

## 2020-01-07 DIAGNOSIS — M51369 Other intervertebral disc degeneration, lumbar region without mention of lumbar back pain or lower extremity pain: Secondary | ICD-10-CM | POA: Insufficient documentation

## 2020-02-01 ENCOUNTER — Other Ambulatory Visit: Payer: Self-pay | Admitting: Orthopedic Surgery

## 2020-02-01 DIAGNOSIS — M48062 Spinal stenosis, lumbar region with neurogenic claudication: Secondary | ICD-10-CM

## 2020-02-07 ENCOUNTER — Other Ambulatory Visit: Payer: Self-pay

## 2020-02-07 ENCOUNTER — Ambulatory Visit
Admission: RE | Admit: 2020-02-07 | Discharge: 2020-02-07 | Disposition: A | Discharge status: Home / Self Care | Payer: Medicare Other | Source: Ambulatory Visit | Attending: Orthopedic Surgery | Admitting: Orthopedic Surgery

## 2020-02-07 DIAGNOSIS — M4326 Fusion of spine, lumbar region: Secondary | ICD-10-CM | POA: Diagnosis not present

## 2020-02-07 DIAGNOSIS — M48062 Spinal stenosis, lumbar region with neurogenic claudication: Secondary | ICD-10-CM

## 2020-02-07 DIAGNOSIS — M5416 Radiculopathy, lumbar region: Secondary | ICD-10-CM | POA: Diagnosis not present

## 2020-02-07 MED ORDER — METHYLPREDNISOLONE ACETATE 40 MG/ML INJ SUSP (RADIOLOG
120.0000 mg | Freq: Once | INTRAMUSCULAR | Status: AC
  Administered 2020-02-07: 120 mg via EPIDURAL

## 2020-02-07 MED ORDER — IOPAMIDOL (ISOVUE-M 200) INJECTION 41%
1.0000 mL | Freq: Once | INTRAMUSCULAR | Status: AC
  Administered 2020-02-07: 1 mL via EPIDURAL

## 2020-02-07 NOTE — Discharge Instructions (Signed)

## 2020-02-15 ENCOUNTER — Telehealth: Payer: Self-pay | Admitting: Family Medicine

## 2020-02-15 NOTE — Telephone Encounter (Signed)
That is fine to change to me.

## 2020-02-15 NOTE — Telephone Encounter (Signed)
I'm ok with change.  Will defer to MMM for decision

## 2020-02-15 NOTE — Telephone Encounter (Signed)
Appt scheduled with MMM. Patient notified

## 2020-02-23 DIAGNOSIS — M170 Bilateral primary osteoarthritis of knee: Secondary | ICD-10-CM | POA: Diagnosis not present

## 2020-02-25 ENCOUNTER — Telehealth: Payer: Self-pay

## 2020-02-25 ENCOUNTER — Ambulatory Visit (INDEPENDENT_AMBULATORY_CARE_PROVIDER_SITE_OTHER): Payer: Medicare Other | Admitting: Nurse Practitioner

## 2020-02-25 ENCOUNTER — Encounter: Payer: Self-pay | Admitting: Nurse Practitioner

## 2020-02-25 ENCOUNTER — Other Ambulatory Visit: Payer: Self-pay

## 2020-02-25 VITALS — BP 135/73 | HR 54 | Temp 97.9°F | Ht 63.5 in | Wt 197.4 lb

## 2020-02-25 DIAGNOSIS — F332 Major depressive disorder, recurrent severe without psychotic features: Secondary | ICD-10-CM | POA: Diagnosis not present

## 2020-02-25 DIAGNOSIS — E559 Vitamin D deficiency, unspecified: Secondary | ICD-10-CM | POA: Diagnosis not present

## 2020-02-25 DIAGNOSIS — D649 Anemia, unspecified: Secondary | ICD-10-CM | POA: Diagnosis not present

## 2020-02-25 DIAGNOSIS — Z6834 Body mass index (BMI) 34.0-34.9, adult: Secondary | ICD-10-CM

## 2020-02-25 DIAGNOSIS — E876 Hypokalemia: Secondary | ICD-10-CM | POA: Diagnosis not present

## 2020-02-25 DIAGNOSIS — I10 Essential (primary) hypertension: Secondary | ICD-10-CM | POA: Diagnosis not present

## 2020-02-25 DIAGNOSIS — F419 Anxiety disorder, unspecified: Secondary | ICD-10-CM

## 2020-02-25 DIAGNOSIS — I700 Atherosclerosis of aorta: Secondary | ICD-10-CM | POA: Diagnosis not present

## 2020-02-25 DIAGNOSIS — E039 Hypothyroidism, unspecified: Secondary | ICD-10-CM | POA: Diagnosis not present

## 2020-02-25 DIAGNOSIS — R609 Edema, unspecified: Secondary | ICD-10-CM | POA: Diagnosis not present

## 2020-02-25 DIAGNOSIS — E7800 Pure hypercholesterolemia, unspecified: Secondary | ICD-10-CM | POA: Diagnosis not present

## 2020-02-25 DIAGNOSIS — J449 Chronic obstructive pulmonary disease, unspecified: Secondary | ICD-10-CM | POA: Diagnosis not present

## 2020-02-25 MED ORDER — TELMISARTAN 40 MG PO TABS: ORAL_TABLET | ORAL | 1 refills | Status: DC

## 2020-02-25 MED ORDER — ALPRAZOLAM 0.5 MG PO TABS: 0.5000 mg | ORAL_TABLET | Freq: Two times a day (BID) | ORAL | 2 refills | Status: DC | PRN

## 2020-02-25 MED ORDER — FUROSEMIDE 40 MG PO TABS: 60.0000 mg | ORAL_TABLET | Freq: Every day | ORAL | 1 refills | Status: DC

## 2020-02-25 MED ORDER — POTASSIUM CHLORIDE 20 MEQ PO PACK: 40.0000 meq | PACK | Freq: Every day | ORAL | 1 refills | Status: DC

## 2020-02-25 MED ORDER — LEVOTHYROXINE SODIUM 50 MCG PO TABS: ORAL_TABLET | ORAL | 1 refills | Status: DC

## 2020-02-25 MED ORDER — SERTRALINE HCL 100 MG PO TABS: 100.0000 mg | ORAL_TABLET | Freq: Every day | ORAL | 3 refills | Status: DC

## 2020-02-25 MED ORDER — OMEPRAZOLE-SODIUM BICARBONATE 40-1100 MG PO CAPS: 1.0000 | ORAL_CAPSULE | Freq: Every day | ORAL | Status: DC

## 2020-02-25 NOTE — Progress Notes (Signed)
Subjective:    Patient ID: [name], female    DOB: [birth year], 74 y.o.   MRN: [id number]   Chief Complaint: Medical Management of Chronic Issues    HPI:  1. Essential hypertension No c/o chest pain, sob or headache. Does not check blood pressure at home. BP Readings from Last 3 Encounters:  02/25/20 135/73  02/07/20 (!) 145/69  10/04/19 130/77     2. Thoracic aorta atherosclerosis (H[facility]) No chest pain.   3. Chronic obstructive pulmonary disease, unspecified COPD type ([place]) Denies cough. Is on no inhalers.  4. Anemia, unspecified type denies fatigue Lab Results  Component Value Date   HGB 11.7 08/31/2019     5. Anxiety Is on xanax 1-2 times a day. Says her daughter stresses her out.  GAD 7 : Generalized Anxiety Score 02/25/2020 02/25/2020 08/31/2019  Nervous, Anxious, on Edge 1 3 3   Control/stop worrying 2 3 3   Worry too much - different things 2 3 3   Trouble relaxing 0 1 1  Restless 0 0 0  Easily annoyed or irritable 0 1 1  Afraid - awful might happen 0 1 1  Total GAD 7 Score 5 12 12   Anxiety Difficulty Somewhat difficult Somewhat difficult Somewhat difficult      6. Severe episode of recurrent major depressive disorder, without psychotic features ([place]) Has been on zoloft several years. Is doing well Depression screen [facility] 2/9 02/25/2020 10/04/2019 09/30/2019  Decreased Interest 0 0 0  Down, Depressed, Hopeless 0 0 0  PHQ - 2 Score 0 0 0  Altered sleeping - - -  Tired, decreased energy - - -  Change in appetite - - -  Feeling bad or failure about yourself  - - -  Trouble concentrating - - -  Moving slowly or fidgety/restless - - -  Suicidal thoughts - - -  PHQ-9 Score - - -  Difficult doing work/chores - - -     7. Pure hypercholesterolemia Does  not watch diet and does little to no exercise Lab Results  Component Value Date   CHOL 139 06/01/2019   HDL 65 06/01/2019   LDLCALC 46 06/01/2019   TRIG 171 (H) 06/01/2019   CHOLHDL 2.1 06/01/2019      8. Vitamin D deficiency Takes vitamin d supplement daily  9. BMI 34.0-34.9 Weight is down 3lbs Wt Readings from Last 3 Encounters:  02/25/20 197 lb 6.4 oz (89.5 kg)  10/04/19 200 lb (90.7 kg)  09/30/19 202 lb 12.8 oz (92 kg)   BMI Readings from Last 3 Encounters:  02/25/20 34.42 kg/m  10/04/19 19.93 kg/m  09/30/19 35.92 kg/m     Outpatient Encounter Medications as of 02/25/2020  Medication Sig   acetaminophen (TYLENOL) 650 MG CR tablet Take 650 mg by mouth every 8 (eight) hours as needed for pain.   ALPRAZolam (XANAX) 0.5 MG tablet Take 1 tablet (0.5 mg total) by mouth 3 (three) times daily as needed for anxiety (use SPARINGLY).   aspirin 81 MG EC tablet Take 1 tablet (81 mg total) by mouth daily. Swallow whole.   calcium carbonate (OS-CAL - DOSED IN MG OF ELEMENTAL CALCIUM) 1250 (500 Ca) MG tablet Take 1 tablet by mouth once a week.    cholecalciferol (VITAMIN D) 1000 units tablet Take 1,000 Units by mouth daily.   furosemide (LASIX) 40 MG tablet Take 60 mg by mouth daily. CAN TAKE 80 MG FOR SWELLING   levothyroxine (SYNTHROID) 50 MCG tablet TAKE ONE (1) TABLET EACH DAY  Multiple Vitamin (MULTIVITAMIN) tablet Take 1 tablet by mouth 2 (two) times a week.    omeprazole-sodium bicarbonate (ZEGERID) 40-1100 MG capsule Take 1 capsule by mouth daily before breakfast.   potassium chloride (KLOR-CON) 20 MEQ packet Take 40 mEq by mouth daily.   sertraline (ZOLOFT) 100 MG tablet Take 1 tablet (100 mg total) by mouth daily.   telmisartan (MICARDIS) 40 MG tablet TAKE ONE (1) TABLET EACH DAY   [DISCONTINUED] Clobetasol Prop Emollient Base 0.05 % emollient cream Apply 1 application topically 2 (two) times daily.   No facility-administered encounter medications on file as of 02/25/2020.    Past Surgical History:  Procedure Laterality Date   ABDOMINAL HYSTERECTOMY     CHOLECYSTECTOMY N/A 11/04/2013   Procedure: LAPAROSCOPIC CHOLECYSTECTOMY WITH INTRAOPERATIVE  CHOLANGIOGRAM;  Surgeon: Imogene Burn. Georgette Dover, MD;  Location: [facility];  Service: General;  Laterality: N/A;   COLONOSCOPY     FRACTURE SURGERY     pins removed from Hip surgery   HIP FRACTURE SURGERY  1990    pins removed in [facility]      Family History  Problem Relation Age of Onset   Arthritis Other    Heart disease Other    Cancer Other    Diabetes Other    OCD Other     New complaints: None today  Social history: Her daughter lives with her and is very OCD. Is driving paitnet crazy  Controlled substance contract: 02/25/20    Review of Systems  Constitutional: Negative for diaphoresis.  Eyes: Negative for pain.  Respiratory: Negative for shortness of breath.   Cardiovascular: Negative for chest pain, palpitations and leg swelling.  Gastrointestinal: Negative for abdominal pain.  Endocrine: Negative for polydipsia.  Skin: Negative for rash.  Neurological: Negative for dizziness, weakness and headaches.  Hematological: Does not bruise/bleed easily.  All other systems reviewed and are negative.      Objective:   Physical Exam Vitals and nursing note reviewed.  Constitutional:      General: She is not in acute distress.    Appearance: Normal appearance. She is well-developed.  HENT:     Head: Normocephalic.     Nose: Nose normal.  Eyes:     Pupils: Pupils are equal, round, and reactive to light.  Neck:     Vascular: No carotid bruit or JVD.  Cardiovascular:     Rate and Rhythm: Normal rate and regular rhythm.     Heart sounds: Normal heart sounds.  Pulmonary:     Effort: Pulmonary effort is normal. No respiratory distress.     Breath sounds: Normal breath sounds. No wheezing or rales.  Chest:     Chest wall: No tenderness.  Abdominal:     General: Bowel sounds are normal. There is no distension or abdominal bruit.     Palpations: Abdomen is soft. There is no  hepatomegaly, splenomegaly, mass or pulsatile mass.     Tenderness: There is no abdominal tenderness.  Musculoskeletal:        General: Normal range of motion.     Cervical back: Normal range of motion and neck supple.     Right lower leg: Edema (1+) present.     Left lower leg: Edema (1+) present.  Lymphadenopathy:     Cervical: No cervical adenopathy.  Skin:    General: Skin is warm and dry.  Neurological:     Mental Status:  She is alert and oriented to person, place, and time.     Deep Tendon Reflexes: Reflexes are normal and symmetric.  Psychiatric:        Behavior: Behavior normal.        Thought Content: Thought content normal.        Judgment: Judgment normal.      BP 135/73    Pulse (!) 54    Temp 97.9 F (36.6 C) (Temporal)    Ht 5' 3.5" (1.613 m)    Wt 197 lb 6.4 oz (89.5 kg)    BMI 34.42 kg/m       Assessment & Plan:  [name] comes in today with chief complaint of Medical Management of Chronic Issues   Diagnosis and orders addressed:  1. Essential hypertension Low sodium diet - telmisartan (MICARDIS) 40 MG tablet; TAKE ONE (1) TABLET EACH DAY  Dispense: 90 tablet; Refill: 1 - CMP14+EGFR - Lipid panel  2. Thoracic aorta atherosclerosis ([place])  3. Chronic obstructive pulmonary disease, unspecified COPD type ([place]) Avoid cigarette smoke  4. Anemia, unspecified type - CBC with Differential/Platelet  5. Anxiety Stress management - ALPRAZolam (XANAX) 0.5 MG tablet; Take 1 tablet (0.5 mg total) by mouth 2 (two) times daily as needed for anxiety (use SPARINGLY).  Dispense: 60 tablet; Refill: 2 - sertraline (ZOLOFT) 100 MG tablet; Take 1 tablet (100 mg total) by mouth daily.  Dispense: 90 tablet; Refill: 3  6. Severe episode of recurrent major depressive disorder, without psychotic features ([place]) - sertraline (ZOLOFT) 100 MG tablet; Take 1 tablet (100 mg total) by mouth daily.  Dispense: 90 tablet; Refill: 3  7. Pure hypercholesterolemia watch fats in  diet  8. Vitamin D deficiency *continue daily vitamind supplement**  9. BMI 34.0-34.9,adult Discussed diet and exercise for person with BMI >25 Will recheck weight in 3-6 months  10. Acquired hypothyroidism Labs pending - levothyroxine (SYNTHROID) 50 MCG tablet; TAKE ONE (1) TABLET EACH DAY  Dispense: 90 tablet; Refill: 1 - Thyroid Panel With TSH  11. Hypokalemia - potassium chloride (KLOR-CON) 20 MEQ packet; Take 40 mEq by mouth daily.  Dispense: 90 each; Refill: 1  12. Peripheral edema Elevate legs when sitting   Labs pending Health Maintenance reviewed Diet and exercise encouraged  Follow up plan: 6 months   [place], FNP

## 2020-02-25 NOTE — Telephone Encounter (Signed)
RX for Xanax .5mg  is not going through under [facility] [employer]  Says ID expired.  Is something changed with her prescribing.  Can Dr. Warrick Parisian send RX through?

## 2020-02-26 LAB — CBC WITH DIFFERENTIAL/PLATELET
Basophils Absolute: 0 10*3/uL (ref 0.0–0.2)
Basos: 0 %
EOS (ABSOLUTE): 0 10*3/uL (ref 0.0–0.4)
Eos: 0 %
Hematocrit: 38.6 % (ref 34.0–46.6)
Hemoglobin: 12.5 g/dL (ref 11.1–15.9)
Immature Grans (Abs): 0.1 10*3/uL (ref 0.0–0.1)
Immature Granulocytes: 1 %
Lymphocytes Absolute: 1.8 10*3/uL (ref 0.7–3.1)
Lymphs: 11 %
MCH: 29.8 pg (ref 26.6–33.0)
MCHC: 32.4 g/dL (ref 31.5–35.7)
MCV: 92 fL (ref 79–97)
Monocytes Absolute: 1.1 10*3/uL — ABNORMAL HIGH (ref 0.1–0.9)
Monocytes: 7 %
Neutrophils Absolute: 13.3 10*3/uL — ABNORMAL HIGH (ref 1.4–7.0)
Neutrophils: 81 %
Platelets: 418 10*3/uL (ref 150–450)
RBC: 4.2 x10E6/uL (ref 3.77–5.28)
RDW: 13.6 % (ref 11.7–15.4)
WBC: 16.3 10*3/uL — ABNORMAL HIGH (ref 3.4–10.8)

## 2020-02-26 LAB — LIPID PANEL
Chol/HDL Ratio: 2.4 ratio (ref 0.0–4.4)
Cholesterol, Total: 200 mg/dL — ABNORMAL HIGH (ref 100–199)
HDL: 84 mg/dL (ref >39)
LDL Chol Calc (NIH): 93 mg/dL (ref 0–99)
Triglycerides: 137 mg/dL (ref 0–149)
VLDL Cholesterol Cal: 23 mg/dL (ref 5–40)

## 2020-02-26 LAB — CMP14+EGFR
ALT: 10 IU/L (ref 0–32)
AST: 13 IU/L (ref 0–40)
Albumin/Globulin Ratio: 1.6 (ref 1.2–2.2)
Albumin: 4.4 g/dL (ref 3.7–4.7)
Alkaline Phosphatase: 122 IU/L — ABNORMAL HIGH (ref 44–121)
BUN/Creatinine Ratio: 24 (ref 12–28)
BUN: 30 mg/dL — ABNORMAL HIGH (ref 8–27)
Bilirubin Total: 0.4 mg/dL (ref 0.0–1.2)
CO2: 25 mmol/L (ref 20–29)
Calcium: 9.5 mg/dL (ref 8.7–10.3)
Chloride: 99 mmol/L (ref 96–106)
Creatinine, Ser: 1.23 mg/dL — ABNORMAL HIGH (ref 0.57–1.00)
GFR calc Af Amer: 50 mL/min/{1.73_m2} — ABNORMAL LOW (ref >59)
GFR calc non Af Amer: 44 mL/min/{1.73_m2} — ABNORMAL LOW (ref >59)
Globulin, Total: 2.8 g/dL (ref 1.5–4.5)
Glucose: 80 mg/dL (ref 65–99)
Potassium: 4 mmol/L (ref 3.5–5.2)
Sodium: 143 mmol/L (ref 134–144)
Total Protein: 7.2 g/dL (ref 6.0–8.5)

## 2020-02-26 LAB — THYROID PANEL WITH TSH
Free Thyroxine Index: 1.7 (ref 1.2–4.9)
T3 Uptake Ratio: 25 % (ref 24–39)
T4, Total: 6.9 ug/dL (ref 4.5–12.0)
TSH: 1.59 u[IU]/mL (ref 0.450–4.500)

## 2020-07-03 ENCOUNTER — Ambulatory Visit (INDEPENDENT_AMBULATORY_CARE_PROVIDER_SITE_OTHER): Payer: Medicare Other | Admitting: Nurse Practitioner

## 2020-07-03 DIAGNOSIS — Z5329 Procedure and treatment not carried out because of patient's decision for other reasons: Secondary | ICD-10-CM | POA: Insufficient documentation

## 2020-07-03 DIAGNOSIS — Z91199 Patient's noncompliance with other medical treatment and regimen due to unspecified reason: Secondary | ICD-10-CM | POA: Insufficient documentation

## 2020-07-04 ENCOUNTER — Ambulatory Visit (INDEPENDENT_AMBULATORY_CARE_PROVIDER_SITE_OTHER): Payer: Medicare Other | Admitting: Nurse Practitioner

## 2020-07-04 DIAGNOSIS — K047 Periapical abscess without sinus: Secondary | ICD-10-CM | POA: Diagnosis not present

## 2020-07-04 MED ORDER — CLINDAMYCIN HCL 300 MG PO CAPS: 300.0000 mg | ORAL_CAPSULE | Freq: Three times a day (TID) | ORAL | 0 refills | Status: DC

## 2020-07-04 NOTE — Progress Notes (Signed)
   Virtual Visit via telephone Note Due to COVID-19 pandemic this visit was conducted virtually. This visit type was conducted due to national recommendations for restrictions regarding the COVID-19 Pandemic (e.g. social distancing, sheltering in place) in an effort to limit this patient's exposure and mitigate transmission in our community. All issues noted in this document were discussed and addressed.  A physical exam was not performed with this format.  I connected with [name] on 07/04/20 at 12:45 PM  by telephone and verified that I am speaking with the correct person using two identifiers. [name] is currently located at home during visit. The provider, Ivy Lynn, NP is located in their office at time of visit.  I discussed the limitations, risks, security and privacy concerns of performing an evaluation and management service by telephone and the availability of in person appointments. I also discussed with the patient that there may be a patient responsible charge related to this service. The patient expressed understanding and agreed to proceed.   History and Present Illness:  Dental Pain  This is a new problem. The current episode started in the past 7 days. The problem occurs constantly. The problem has been gradually worsening. The pain is moderate. Associated symptoms include facial pain. Pertinent negatives include no difficulty swallowing, fever or sinus pressure. She has tried acetaminophen for the symptoms. The treatment provided mild relief.  Patient is scheduled to see dental surgery this Thursday second/02/2021.  Review of Systems  Constitutional: Negative for chills, fever and malaise/fatigue.  HENT: Negative for ear pain, sinus pressure, sinus pain and sore throat.        Jaw pain  Neurological: Positive for headaches.  All other systems reviewed and are negative.    Observations/Objective:  Televisit.  Patient does not sound to be in  distress Assessment and Plan: Dental abscess Patient is reporting new dental abscess with worsening pain in the last few days.  Patient is unable to see a dentist until this Thursday.  Started patient on clindamycin 300 mg tablet 3 times daily for 7 days.  Use Tylenol for pain advised patient to follow-up with worsening or unresolved symptoms.  Patient verbalized understanding.  Rx sent to pharmacy     Follow Up Instructions:  Follow up with worsening or unresolved sysmptoms    I discussed the assessment and treatment plan with the patient. The patient was provided an opportunity to ask questions and all were answered. The patient agreed with the plan and demonstrated an understanding of the instructions.   The patient was advised to call back or seek an in-person evaluation if the symptoms worsen or if the condition fails to improve as anticipated.  The above assessment and management plan was discussed with the patient. The patient verbalized understanding of and has agreed to the management plan. Patient is aware to call the clinic if symptoms persist or worsen. Patient is aware when to return to the clinic for a follow-up visit. Patient educated on when it is appropriate to go to the emergency department.   Time call ended: 12:55 PM  I provided 10 minutes of non-face-to-face time during this encounter.    Ivy Lynn, NP

## 2020-07-04 NOTE — Assessment & Plan Note (Signed)
Patient is reporting new dental abscess with worsening pain in the last few days.  Patient is unable to see a dentist until this Thursday.  Started patient on clindamycin 300 mg tablet 3 times daily for 7 days.  Use Tylenol for pain advised patient to follow-up with worsening or unresolved symptoms.  Patient verbalized understanding.  Rx sent to pharmacy

## 2020-08-25 ENCOUNTER — Ambulatory Visit: Payer: Self-pay | Admitting: Nurse Practitioner

## 2020-09-04 NOTE — Progress Notes (Addendum)
Cardiology Office Note  Date: 09/05/2020   ID: [name], DOB 1945-08-07, MRN [id number]  PCP:  Chevis Pretty, FNP  Cardiologist:  Carlyle Dolly, MD Electrophysiologist:  None   Chief Complaint: Follow up Chronic diastolic HF  History of Present Illness: [name] is a 75 y.o. female with a history of Chronic diastolic HF, LE edema / swelling.   Last visit with Dr Harl Bowie via telemedicine 08/25/2019. Had been having some LE edema, Previous echo 2019 EF 60-65% , No WMAs, abnormal diastolic function,  At a previous visit her Lasix was increased to 80 mg alternating with 60 mg QOD. She was only taking 60 mg po daily. Weight was down to 204  lbs. In January 2021 weight had been as high as 221. Dr Harl Bowie suspected combined diastolic HF and lymphedema. Had not been able to establish at lymphedema clinic due transportation issues. Plans were to continue Lasix 60 mg alternating with 40 mg. Will take 80 mg as needed. To repeat BMET/Mg. To carefully dose diuretic given history of prior AKI.     Patient presents today for follow-up for chronic diastolic heart failure and lymphedema/lower extremity edema.  She denies any recent acute illnesses or hospitalizations.  States she is having a lot of issues with her daughter who has apparent mental illness and she is taking care of her.  She states between having to take care of her daughter and other stressful issues such as lack of transportation she was unable to go to lymphedema clinic.  She denies any recent shortness of breath, weight gain.  In fact she has lost some weight recently.  She does have chronic lower extremity edema/lymphedema.  She continues with Lasix 60 mg alternating with 40mg  QOD.  Past Medical History:  Diagnosis Date  . Allergy   . Bronchitis, chronic ([place])   . Chronic anxiety   . Complication of anesthesia    hard to wake up  . COPD (chronic obstructive pulmonary disease) ([place])   . Depression   . Esophageal  reflux   . Headache(784.0)   . Hyperlipidemia   . Hypertension   . Hypothyroidism   . Obesity   . Osteoporosis   . Overactive bladder   . PVC's (premature ventricular contractions)   . Thyroid disease   . Vitamin D deficiency disease     Past Surgical History:  Procedure Laterality Date  . ABDOMINAL HYSTERECTOMY    . CHOLECYSTECTOMY N/A 11/04/2013   Procedure: LAPAROSCOPIC CHOLECYSTECTOMY WITH INTRAOPERATIVE CHOLANGIOGRAM;  Surgeon: Imogene Burn. Georgette Dover, MD;  Location: [place];  Service: General;  Laterality: N/A;  . COLONOSCOPY    . FRACTURE SURGERY     pins removed from Hip surgery  . HIP FRACTURE SURGERY  1990    pins removed in 1991  . PARTIAL HYSTERECTOMY  1987  . SPINAL FUSION    . UPPER GASTROINTESTINAL ENDOSCOPY      Current Outpatient Medications  Medication Sig Dispense Refill  . acetaminophen (TYLENOL) 650 MG CR tablet Take 650 mg by mouth every 8 (eight) hours as needed for pain.    Marland Kitchen ALPRAZolam (XANAX) 0.5 MG tablet Take 1 tablet (0.5 mg total) by mouth 2 (two) times daily as needed for anxiety (use SPARINGLY). 60 tablet 2  . aspirin 81 MG EC tablet Take 1 tablet (81 mg total) by mouth daily. Swallow whole. 30 tablet 12  . calcium carbonate (OS-CAL - DOSED IN MG OF ELEMENTAL CALCIUM) 1250 (500 Ca) MG tablet Take 1 tablet by  mouth once a week.     . cholecalciferol (VITAMIN D) 1000 units tablet Take 1,000 Units by mouth daily.    . furosemide (LASIX) 40 MG tablet Take 1.5 tablets (60 mg total) by mouth daily. CAN TAKE 80 MG FOR SWELLING 90 tablet 1  . levothyroxine (SYNTHROID) 50 MCG tablet TAKE ONE (1) TABLET EACH DAY 90 tablet 1  . Multiple Vitamin (MULTIVITAMIN) tablet Take 1 tablet by mouth 2 (two) times a week.     Marland Kitchen omeprazole-sodium bicarbonate (ZEGERID) 40-1100 MG capsule Take 1 capsule by mouth daily before breakfast.    . potassium chloride (KLOR-CON) 20 MEQ packet Take 40 mEq by mouth daily. 90 each 1  . telmisartan (MICARDIS) 40 MG tablet TAKE ONE (1) TABLET  EACH DAY 90 tablet 1   No current facility-administered medications for this visit.   Allergies:  Celebrex [celecoxib], Keflet [cephalexin], Penicillins, Sulfa antibiotics, Kenalog [triamcinolone acetonide], Latex, Lisinopril, and Mobic [meloxicam]   Social History: The patient  reports that she has never smoked. She has never used smokeless tobacco. She reports that she does not drink alcohol and does not use drugs.   Family History: The patient's family history includes Arthritis in an other family member; Cancer in an other family member; Diabetes in an other family member; Heart disease in an other family member; OCD in an other family member.   ROS:  Please see the history of present illness. Otherwise, complete review of systems is positive for none.  All other systems are reviewed and negative.   Physical Exam: VS:  BP 130/70   Pulse (!) 54   Ht 5' 3.5" (1.613 m)   Wt 196 lb 6.4 oz (89.1 kg)   SpO2 98%   BMI 34.24 kg/m , BMI Body mass index is 34.24 kg/m.  Wt Readings from Last 3 Encounters:  09/05/20 196 lb 6.4 oz (89.1 kg)  02/25/20 197 lb 6.4 oz (89.5 kg)  10/04/19 200 lb (90.7 kg)    General: Patient appears comfortable at rest. Neck: Supple, no elevated JVP or carotid bruits, no thyromegaly. Lungs: Clear to auscultation, nonlabored breathing at rest. Cardiac: Regular rate and rhythm, no S3 or significant systolic murmur, no pericardial rub. Extremities: No pitting edema, distal pulses 2+. Skin: Warm and dry. Musculoskeletal: No kyphosis. Neuropsychiatric: Alert and oriented x3, affect grossly appropriate.  ECG: September 05, 2020 sinus bradycardia rate of 53, ST abnormality  Recent Labwork: 02/25/2020: ALT 10; AST 13; BUN 30; Creatinine, Ser 1.23; Hemoglobin 12.5; Platelets 418; Potassium 4.0; Sodium 143; TSH 1.590     Component Value Date/Time   CHOL 200 (H) 02/25/2020 1546   TRIG 137 02/25/2020 1546   HDL 84 02/25/2020 1546   CHOLHDL 2.4 02/25/2020 1546    [place] 93 02/25/2020 1546    Other Studies Reviewed Today:  09/2017 echo Study Conclusions  - Left ventricle: The cavity size was normal. Wall thickness was increased in a pattern of mild LVH. Systolic function was normal. The estimated ejection fraction was in the range of 60% to 65%. Wall motion was normal; there were no regional wall motion abnormalities. - Aortic valve: Mildly calcified annulus. Trileaflet; mildly thickened leaflets. Mean gradient (S): 8 mm Hg. Valve area (VTI): 2.13 cm^2. Valve area (Vmax): 1.86 cm^2. Valve area (Vmean): 2.05 cm^2. - Left atrium: The atrium was severely dilated. - Right atrium: The atrium was mildly dilated. - Technically adequate study.  Assessment and Plan:  1. Chronic diastolic heart failure (HCC)   2. Leg edema  1. Chronic diastolic heart failure ([place]) Denies any shortness of breath or weight gain.  She has chronic lower extremity edema/lymphedema.  Continue Lasix 60 mg daily.  Can take Lasix 80 mg for swelling.  Continue telmisartan 40 mg daily.  Continue potassium supplementation 40 mEq daily  2. Leg edema Continues with chronic lower extremity edema/lymphedema.  She would greatly benefit from lymphedema clinic treatment.  Will we refer her to the clinic.  Hopefully she may be able to get a home compression device.  She has transportation issues so this would be optimal to have home compression device.  Advised her to try to keep the lymphedema clinic appointment.  Medication Adjustments/Labs and Tests Ordered: Current medicines are reviewed at length with the patient today.  Concerns regarding medicines are outlined above.   Disposition: Follow-up with Dr. Harl Bowie or APP 6 months  Signed, Levell July, NP 09/05/2020 2:33 PM    [facility] [place], [place], [place] [address] Phone: [phone]; Fax: [phone]

## 2020-09-05 ENCOUNTER — Encounter: Payer: Self-pay | Admitting: Family Medicine

## 2020-09-05 ENCOUNTER — Ambulatory Visit (INDEPENDENT_AMBULATORY_CARE_PROVIDER_SITE_OTHER): Payer: Medicare Other | Admitting: Family Medicine

## 2020-09-05 VITALS — BP 130/70 | HR 54 | Ht 63.5 in | Wt 196.4 lb

## 2020-09-05 DIAGNOSIS — I5032 Chronic diastolic (congestive) heart failure: Secondary | ICD-10-CM | POA: Diagnosis not present

## 2020-09-05 DIAGNOSIS — R600 Localized edema: Secondary | ICD-10-CM | POA: Diagnosis not present

## 2020-09-05 NOTE — Patient Instructions (Signed)
Your physician wants you to follow-up in: [place] will receive a reminder letter in the mail two months in advance. If you don't receive a letter, please call our office to schedule the follow-up appointment.  Your physician recommends that you continue on your current medications as directed. Please refer to the Current Medication list given to you today.  You have been referred to [facility]   Thank you for choosing [facility]!!

## 2020-12-07 DIAGNOSIS — Z800 Family history of malignant neoplasm of digestive organs: Secondary | ICD-10-CM | POA: Diagnosis not present

## 2020-12-07 DIAGNOSIS — K9089 Other intestinal malabsorption: Secondary | ICD-10-CM | POA: Diagnosis not present

## 2020-12-07 DIAGNOSIS — R634 Abnormal weight loss: Secondary | ICD-10-CM | POA: Diagnosis not present

## 2020-12-07 DIAGNOSIS — K219 Gastro-esophageal reflux disease without esophagitis: Secondary | ICD-10-CM | POA: Diagnosis not present

## 2020-12-08 ENCOUNTER — Other Ambulatory Visit: Payer: Self-pay | Admitting: Nurse Practitioner

## 2020-12-08 DIAGNOSIS — N289 Disorder of kidney and ureter, unspecified: Secondary | ICD-10-CM | POA: Insufficient documentation

## 2021-01-10 ENCOUNTER — Other Ambulatory Visit: Payer: Self-pay | Admitting: Nurse Practitioner

## 2021-01-10 DIAGNOSIS — I10 Essential (primary) hypertension: Secondary | ICD-10-CM

## 2021-01-24 ENCOUNTER — Other Ambulatory Visit: Payer: Self-pay | Admitting: Nurse Practitioner

## 2021-01-24 DIAGNOSIS — E876 Hypokalemia: Secondary | ICD-10-CM

## 2021-01-25 ENCOUNTER — Other Ambulatory Visit (HOSPITAL_COMMUNITY): Payer: Self-pay | Admitting: Nephrology

## 2021-01-25 ENCOUNTER — Other Ambulatory Visit: Payer: Self-pay | Admitting: Nephrology

## 2021-01-25 DIAGNOSIS — D638 Anemia in other chronic diseases classified elsewhere: Secondary | ICD-10-CM | POA: Diagnosis not present

## 2021-01-25 DIAGNOSIS — E876 Hypokalemia: Secondary | ICD-10-CM | POA: Diagnosis not present

## 2021-01-25 DIAGNOSIS — I5032 Chronic diastolic (congestive) heart failure: Secondary | ICD-10-CM | POA: Diagnosis not present

## 2021-01-25 DIAGNOSIS — E559 Vitamin D deficiency, unspecified: Secondary | ICD-10-CM | POA: Diagnosis not present

## 2021-01-25 DIAGNOSIS — I129 Hypertensive chronic kidney disease with stage 1 through stage 4 chronic kidney disease, or unspecified chronic kidney disease: Secondary | ICD-10-CM | POA: Diagnosis not present

## 2021-01-25 DIAGNOSIS — N183 Chronic kidney disease, stage 3 unspecified: Secondary | ICD-10-CM

## 2021-01-25 DIAGNOSIS — N1831 Chronic kidney disease, stage 3a: Secondary | ICD-10-CM | POA: Diagnosis not present

## 2021-01-25 DIAGNOSIS — I890 Lymphedema, not elsewhere classified: Secondary | ICD-10-CM | POA: Diagnosis not present

## 2021-02-07 ENCOUNTER — Other Ambulatory Visit (HOSPITAL_COMMUNITY): Payer: Medicare Other

## 2021-02-12 ENCOUNTER — Ambulatory Visit (HOSPITAL_COMMUNITY): Payer: Medicare Other

## 2021-02-12 ENCOUNTER — Encounter (HOSPITAL_COMMUNITY): Payer: Self-pay

## 2021-02-13 ENCOUNTER — Other Ambulatory Visit: Payer: Self-pay

## 2021-02-13 ENCOUNTER — Ambulatory Visit (HOSPITAL_COMMUNITY): Payer: Medicare Other | Attending: Nephrology | Admitting: Physical Therapy

## 2021-02-13 DIAGNOSIS — I890 Lymphedema, not elsewhere classified: Secondary | ICD-10-CM | POA: Insufficient documentation

## 2021-02-13 NOTE — Therapy (Signed)
[name], [place], [address] Phone: [phone]   Fax:  [phone]  Physical Therapy Evaluation  Patient Details  Name: [name] MRN: [id number] Date of Birth: [birth year] Referring Provider (PT): Theador Hawthorne Manpreet   Encounter Date: 02/13/2021   PT End of Session - 02/13/21 1522     Visit Number 1    Number of Visits 1    Authorization Type Medicare//[place]    PT Start Time 1400    PT Stop Time 1520    PT Time Calculation (min) 80 min    Activity Tolerance Patient tolerated treatment well             Past Medical History:  Diagnosis Date   Allergy    Bronchitis, chronic ([place])    Chronic anxiety    Complication of anesthesia    hard to wake up   COPD (chronic obstructive pulmonary disease) ([place])    Depression    Esophageal reflux    Headache(784.0)    Hyperlipidemia    Hypertension    Hypothyroidism    Obesity    Osteoporosis    Overactive bladder    PVC's (premature ventricular contractions)    Thyroid disease    Vitamin D deficiency disease     Past Surgical History:  Procedure Laterality Date   ABDOMINAL HYSTERECTOMY     CHOLECYSTECTOMY N/A 11/04/2013   Procedure: LAPAROSCOPIC CHOLECYSTECTOMY WITH INTRAOPERATIVE CHOLANGIOGRAM;  Surgeon: Imogene Burn. Georgette Dover, MD;  Location: [place] [facility];  Service: General;  Laterality: N/A;   COLONOSCOPY     FRACTURE SURGERY     pins removed from Hip surgery   HIP FRACTURE SURGERY  1990    pins removed in [facility] ENDOSCOPY      There were no vitals filed for this visit.    Subjective Assessment - 02/13/21 1408     Subjective [name] states that her legs have been swelling for about five years after she had back surgery.  She states that when the legs are really swollen then she has pain but most the pain that most the time she is just tight.  She states that she is on diuretic which  works pretty good but there is still swelling . She does have some compression stockings but she does not wear them all the time due to the incontinence problem she states that sometimes evern with her debends the bowe comes down and soils the garment.    Pertinent History Stage III renal Dz, lumbar spondylosis, chronic CHF, incontinence of bowel and bladder    Currently in Pain? No/denies                [facility] PT Assessment - 02/13/21 0001       Assessment   Medical Diagnosis B Lymphedema Rt greater than left    Referring Provider (PT) Theador Hawthorne Manpreet    Onset Date/Surgical Date --   2018   Next MD Visit 11/11/12022    Prior Therapy none      Precautions   Precautions --   cellulitis     Restrictions   Weight Bearing Restrictions No      Balance Screen   Has the patient fallen in the past 6 months No    Has the patient had a decrease in activity level because of a fear of falling?  Yes    Is the  patient reluctant to leave their home because of a fear of falling?  No      Cognition   Overall Cognitive Status Within Functional Limits for tasks assessed               LYMPHEDEMA/ONCOLOGY QUESTIONNAIRE - 02/13/21 0001       What other symptoms do you have   Are you Having Heaviness or Tightness Yes    Are you having Pain No    Are you having pitting edema Yes    Body Site LE    Is it Hard or Difficult finding clothes that fit No    Do you have infections No    Other Symptoms red and increased edema      Lymphedema Stage   Stage STAGE 2 SPONTANEOUSLY IRREVERSIBLE      Lymphedema Assessments   Lymphedema Assessments Lower extremities      Right Lower Extremity Lymphedema   20 cm Proximal to Suprapatella 56.5 cm    10 cm Proximal to Suprapatella 57 cm    At Midpatella/Popliteal Crease 50.5 cm    30 cm Proximal to Floor at Lateral Plantar Foot 49 cm    20 cm Proximal to Floor at Lateral Plantar Foot 39 1    10 cm Proximal to Floor at Lateral Malleoli 32 cm     Circumference of ankle/heel 36.3 cm.    5 cm Proximal to 1st MTP Joint 23.5 cm    Across MTP Joint 23.4 cm    Around Proximal Great Toe 9.2 cm      Left Lower Extremity Lymphedema   20 cm Proximal to Suprapatella 58 cm    10 cm Proximal to Suprapatella 58.6 cm    At Midpatella/Popliteal Crease 50.8 cm    30 cm Proximal to Floor at Lateral Plantar Foot 48.7 cm    20 cm Proximal to Floor at Lateral Plantar Foot 38.7 cm    10 cm Proximal to Floor at Lateral Malleoli 30.8 cm    Circumference of ankle/heel 35 cm.    5 cm Proximal to 1st MTP Joint 23 cm    Across MTP Joint 23 cm    Around Proximal Great Toe 8.7 cm                     Objective measurements completed on examination: See above findings.                PT Education - 02/13/21 1521     Education Details lymphedema what the condition is and that there is not cure for it , four phases of treatment, skin care, exercises,manual and compression    Person(s) Educated Patient    Methods Explanation    Comprehension Verbalized understanding              PT Short Term Goals - 02/13/21 1528       PT SHORT TERM GOAL #1   Title Pt to be I in HEP and self manual    Time 2    Period Weeks    Status New    Target Date 02/27/21                       Plan - 02/13/21 1523     Clinical Impression Statement [name] is a 75 yo who has had lymphedema for about 5 years.  She was wearing compression garments but failed to wear them regularly as nobody told  her that she was to wear them daily, nor did she get new garments every six months.  She has been referred to skilled therapy for lymphedema treatments but states that there is no way that she can come three times a week therefore we will attempt to treat her condition conservatively.  [name] was educated in skin care, self manual, using a compression pump and the importance of obtaining new compression garments and donning daily.     Personal Factors and Comorbidities Social Background;Time since onset of injury/illness/exacerbation;Transportation;Other    Examination-Activity Limitations Hygiene/Grooming    Examination-Participation Restrictions Other    Stability/Clinical Decision Making Evolving/Moderate complexity    Clinical Decision Making Moderate    Rehab Potential Good    PT Frequency 1x / week    PT Duration --   1 week   PT Treatment/Interventions Patient/family education;Therapeutic exercise    PT Next Visit Plan 1 time visit    PT Home Exercise Plan ankle pumps, LAQ, hip ab/adduction, marching , diaphragmic breathing and lymph squeeze.             Patient will benefit from skilled therapeutic intervention in order to improve the following deficits and impairments:  Difficulty walking, Decreased skin integrity, Increased edema  Visit Diagnosis: Lymphedema, not elsewhere classified     Problem List Patient Active Problem List   Diagnosis Date Noted   Renal insufficiency 12/08/2020   Dental abscess 07/04/2020   No-show for appointment 07/03/2020   Venous (peripheral) insufficiency 09/30/2019   Anemia 08/12/2017   Vitamin D deficiency 08/12/2017   Lumbar spondylosis with myelopathy 06/03/2017   Primary osteoarthritis of both knees 03/19/2017   Anxiety 02/17/2017   History of pulmonary embolus (PE) 07/09/2016   COPD (chronic obstructive pulmonary disease) ([place]) 01/18/2016   Hyperlipidemia 01/18/2016   Depression 01/18/2016   Essential hypertension 01/18/2016   H/O spinal fusion 01/18/2016   Thyroid activity decreased 01/18/2016   Thoracic aorta atherosclerosis ([place]) 01/18/2016   Chronic cholecystitis with calculus 10/04/2013   Rayetta Humphrey, PT CLT [phone]  02/13/2021, 3:30 PM  [facility] [address] [place], [place], [address] Phone: [phone]   Fax:  [phone]  Name: [name] MRN: [id number] Date of Birth:  [birth year]

## 2021-02-15 ENCOUNTER — Encounter (HOSPITAL_COMMUNITY): Payer: Medicare Other | Admitting: Physical Therapy

## 2021-02-16 ENCOUNTER — Other Ambulatory Visit: Payer: Self-pay

## 2021-02-16 ENCOUNTER — Ambulatory Visit (HOSPITAL_COMMUNITY)
Admission: RE | Admit: 2021-02-16 | Discharge: 2021-02-16 | Disposition: A | Discharge status: Home / Self Care | Payer: Medicare Other | Source: Ambulatory Visit | Attending: Nephrology | Admitting: Nephrology

## 2021-02-16 DIAGNOSIS — N183 Chronic kidney disease, stage 3 unspecified: Secondary | ICD-10-CM | POA: Diagnosis not present

## 2021-02-19 ENCOUNTER — Encounter (HOSPITAL_COMMUNITY): Payer: Medicare Other | Admitting: Physical Therapy

## 2021-02-21 ENCOUNTER — Encounter (HOSPITAL_COMMUNITY): Payer: Medicare Other

## 2021-02-23 ENCOUNTER — Encounter (HOSPITAL_COMMUNITY): Payer: Medicare Other

## 2021-02-26 ENCOUNTER — Encounter (HOSPITAL_COMMUNITY): Payer: Medicare Other | Admitting: Physical Therapy

## 2021-02-28 ENCOUNTER — Encounter (HOSPITAL_COMMUNITY): Payer: Medicare Other

## 2021-03-01 ENCOUNTER — Other Ambulatory Visit: Payer: Self-pay

## 2021-03-01 ENCOUNTER — Encounter: Payer: Self-pay | Admitting: Nurse Practitioner

## 2021-03-01 ENCOUNTER — Ambulatory Visit (INDEPENDENT_AMBULATORY_CARE_PROVIDER_SITE_OTHER): Payer: Medicare Other | Admitting: Nurse Practitioner

## 2021-03-01 VITALS — BP 134/82 | HR 50 | Temp 97.6°F | Resp 20 | Ht 63.0 in | Wt 180.0 lb

## 2021-03-01 DIAGNOSIS — J449 Chronic obstructive pulmonary disease, unspecified: Secondary | ICD-10-CM | POA: Diagnosis not present

## 2021-03-01 DIAGNOSIS — Z6834 Body mass index (BMI) 34.0-34.9, adult: Secondary | ICD-10-CM | POA: Diagnosis not present

## 2021-03-01 DIAGNOSIS — N289 Disorder of kidney and ureter, unspecified: Secondary | ICD-10-CM

## 2021-03-01 DIAGNOSIS — D649 Anemia, unspecified: Secondary | ICD-10-CM

## 2021-03-01 DIAGNOSIS — F332 Major depressive disorder, recurrent severe without psychotic features: Secondary | ICD-10-CM | POA: Diagnosis not present

## 2021-03-01 DIAGNOSIS — E039 Hypothyroidism, unspecified: Secondary | ICD-10-CM

## 2021-03-01 DIAGNOSIS — R399 Unspecified symptoms and signs involving the genitourinary system: Secondary | ICD-10-CM

## 2021-03-01 DIAGNOSIS — E7800 Pure hypercholesterolemia, unspecified: Secondary | ICD-10-CM

## 2021-03-01 DIAGNOSIS — I700 Atherosclerosis of aorta: Secondary | ICD-10-CM | POA: Diagnosis not present

## 2021-03-01 DIAGNOSIS — E559 Vitamin D deficiency, unspecified: Secondary | ICD-10-CM | POA: Diagnosis not present

## 2021-03-01 DIAGNOSIS — E876 Hypokalemia: Secondary | ICD-10-CM

## 2021-03-01 DIAGNOSIS — I10 Essential (primary) hypertension: Secondary | ICD-10-CM | POA: Diagnosis not present

## 2021-03-01 DIAGNOSIS — F419 Anxiety disorder, unspecified: Secondary | ICD-10-CM | POA: Diagnosis not present

## 2021-03-01 LAB — MICROSCOPIC EXAMINATION

## 2021-03-01 LAB — URINALYSIS, COMPLETE
Bilirubin, UA: NEGATIVE
Glucose, UA: NEGATIVE
Ketones, UA: NEGATIVE
Nitrite, UA: NEGATIVE
Protein,UA: NEGATIVE
Specific Gravity, UA: 1.02 (ref 1.005–1.030)
Urobilinogen, Ur: 0.2 mg/dL (ref 0.2–1.0)
pH, UA: 5.5 (ref 5.0–7.5)

## 2021-03-01 MED ORDER — ESCITALOPRAM OXALATE 10 MG PO TABS: 10.0000 mg | ORAL_TABLET | Freq: Every day | ORAL | 5 refills | Status: DC

## 2021-03-01 MED ORDER — TELMISARTAN 40 MG PO TABS: ORAL_TABLET | ORAL | 1 refills | Status: DC

## 2021-03-01 MED ORDER — POTASSIUM CHLORIDE CRYS ER 20 MEQ PO TBCR: 40.0000 meq | EXTENDED_RELEASE_TABLET | Freq: Every day | ORAL | 1 refills | Status: DC

## 2021-03-01 MED ORDER — LEVOTHYROXINE SODIUM 50 MCG PO TABS: ORAL_TABLET | ORAL | 1 refills | Status: DC

## 2021-03-01 MED ORDER — ALPRAZOLAM 0.5 MG PO TABS: 0.5000 mg | ORAL_TABLET | Freq: Two times a day (BID) | ORAL | 2 refills | Status: DC | PRN

## 2021-03-01 NOTE — Progress Notes (Signed)
Subjective:    Patient ID: [name], female    DOB: Sep 12, 1945, 75 y.o.   MRN: [id number]  Chief Complaint: medical management of chronic issues     HPI:  1. Essential hypertension No c/o chest pain, sob or headache. Does not check blood pressure at home. BP Readings from Last 3 Encounters:  03/01/21 134/82  09/05/20 130/70  02/25/20 135/73     2. Thoracic aorta atherosclerosis ([place]) Seen on chest xray- denies any heart problems that she is aware of.  3. Chronic obstructive pulmonary disease, unspecified COPD type ([place]) Is on no inhalers. As occasional cough.  4. Hypothyroidism (acquired) No problems that she is aware of. Lab Results  Component Value Date   TSH 1.590 02/25/2020     5. Renal insufficiency No voiding problems and no swelling Lab Results  Component Value Date   CREATININE 1.23 (H) 02/25/2020     6. Pure hypercholesterolemia Does try to watch diet. Does no dedicated exercise. Lab Results  Component Value Date   CHOL 200 (H) 02/25/2020   HDL 84 02/25/2020   LDLCALC 93 02/25/2020   TRIG 137 02/25/2020   CHOLHDL 2.4 02/25/2020     7. Anemia, unspecified type No c/o fatigue. Takes a multivitamin daily with iron Lab Results  Component Value Date   HGB 12.5 02/25/2020     8. Anxiety Has xanax available to take for anxiety but very seldom has to take it GAD 7 : Generalized Anxiety Score 03/01/2021 02/25/2020 02/25/2020 08/31/2019  Nervous, Anxious, on Edge 0 $Remo'1 3 3  'NYHah$ Control/stop worrying 0 $RemoveBe'2 3 3  'JCqPiNGPu$ Worry too much - different things $RemoveBeforeDE'1 2 3 3  'OfhOdevgLjyBOEW$ Trouble relaxing 0 0 1 1  Restless 0 0 0 0  Easily annoyed or irritable 0 0 1 1  Afraid - awful might happen 0 0 1 1  Total GAD 7 Score $Remov'1 5 12 12  'epLYHn$ Anxiety Difficulty Not difficult at all Somewhat difficult Somewhat difficult Somewhat difficult      9. Severe episode of recurrent major depressive disorder, without psychotic features ([place]) Is currently not in an antidepressant.  Depression screen [facility]  2/9 03/01/2021 02/25/2020 10/04/2019 09/30/2019 08/31/2019  Decreased Interest 0 0 0 0 3  Down, Depressed, Hopeless 2 0 0 0 3  PHQ - 2 Score 2 0 0 0 6  Altered sleeping 0 - - - 0  Tired, decreased energy 0 - - - 2  Change in appetite 0 - - - 0  Feeling bad or failure about yourself  0 - - - 0  Trouble concentrating 0 - - - 1  Moving slowly or fidgety/restless 0 - - - 1  Suicidal thoughts 0 - - - 0  PHQ-9 Score 2 - - - 10  Difficult doing work/chores Somewhat difficult - - - Somewhat difficult     10. Vitamin D deficiency Is on a daily vitamin d supplement.  11. hyopkalemia Is on potassium supplement daily. Deneis any heart palpitation or lower ext cramping. Lab Results  Component Value Date   K 4.0 02/25/2020   12.  BMI 34.0-34.9 No recent weight changes Wt Readings from Last 3 Encounters:  03/01/21 180 lb (81.6 kg)  09/05/20 196 lb 6.4 oz (89.1 kg)  02/25/20 197 lb 6.4 oz (89.5 kg)   BMI Readings from Last 3 Encounters:  03/01/21 31.89 kg/m  09/05/20 34.24 kg/m  02/25/20 34.42 kg/m       Outpatient Encounter Medications as of 03/01/2021  Medication Sig  acetaminophen (TYLENOL) 650 MG CR tablet Take 650 mg by mouth every 8 (eight) hours as needed for pain.   ALPRAZolam (XANAX) 0.5 MG tablet Take 1 tablet (0.5 mg total) by mouth 2 (two) times daily as needed for anxiety (use SPARINGLY).   aspirin 81 MG EC tablet Take 1 tablet (81 mg total) by mouth daily. Swallow whole.   calcium carbonate (OS-CAL - DOSED IN MG OF ELEMENTAL CALCIUM) 1250 (500 Ca) MG tablet Take 1 tablet by mouth once a week.    cholecalciferol (VITAMIN D) 1000 units tablet Take 1,000 Units by mouth daily.   furosemide (LASIX) 40 MG tablet Take 1.5 tablets (60 mg total) by mouth daily. CAN TAKE 80 MG FOR SWELLING   levothyroxine (SYNTHROID) 50 MCG tablet TAKE ONE (1) TABLET EACH DAY   Multiple Vitamin (MULTIVITAMIN) tablet Take 1 tablet by mouth 2 (two) times a week.    omeprazole-sodium bicarbonate  (ZEGERID) 40-1100 MG capsule Take 1 capsule by mouth daily before breakfast.   potassium chloride SA (KLOR-CON) 20 MEQ tablet Take 2 tablets (40 mEq total) by mouth daily. (NEEDS TO BE SEEN BEFORE NEXT REFILL)   telmisartan (MICARDIS) 40 MG tablet TAKE ONE (1) TABLET EACH DAY  (NEEDS TO BE SEEN BEFORE NEXT REFILL)   No facility-administered encounter medications on file as of 03/01/2021.    Past Surgical History:  Procedure Laterality Date   ABDOMINAL HYSTERECTOMY     CHOLECYSTECTOMY N/A 11/04/2013   Procedure: LAPAROSCOPIC CHOLECYSTECTOMY WITH INTRAOPERATIVE CHOLANGIOGRAM;  Surgeon: Imogene Burn. Tsuei, MD;  Location: [facility];  Service: General;  Laterality: N/A;   COLONOSCOPY     FRACTURE SURGERY     pins removed from Hip surgery   HIP FRACTURE SURGERY  1990    pins removed in [place] ENDOSCOPY      Family History  Problem Relation Age of Onset   Arthritis Other    Heart disease Other    Cancer Other    Diabetes Other    OCD Other     New complaints: Slight pelvic pressure when urinating  Social history: Daughter still lived with her  Controlled substance contract: 03/01/21     Review of Systems  Constitutional:  Negative for diaphoresis.  Eyes:  Negative for pain.  Respiratory:  Negative for shortness of breath.   Cardiovascular:  Negative for chest pain, palpitations and leg swelling.  Gastrointestinal:  Negative for abdominal pain.  Endocrine: Negative for polydipsia.  Skin:  Negative for rash.  Neurological:  Negative for dizziness, weakness and headaches.  Hematological:  Does not bruise/bleed easily.  All other systems reviewed and are negative.     Objective:   Physical Exam Vitals and nursing note reviewed.  Constitutional:      General: She is not in acute distress.    Appearance: Normal appearance. She is well-developed.  HENT:     Head: Normocephalic.     Right Ear: Tympanic  membrane normal.     Left Ear: Tympanic membrane normal.     Nose: Nose normal.     Mouth/Throat:     Mouth: Mucous membranes are moist.  Eyes:     Pupils: Pupils are equal, round, and reactive to light.  Neck:     Vascular: No carotid bruit or JVD.  Cardiovascular:     Rate and Rhythm: Normal rate and regular rhythm.     Heart sounds: Normal heart sounds.  Pulmonary:     Effort: Pulmonary effort is normal. No respiratory distress.     Breath sounds: Normal breath sounds. No wheezing or rales.  Chest:     Chest wall: No tenderness.  Abdominal:     General: Bowel sounds are normal. There is no distension or abdominal bruit.     Palpations: Abdomen is soft. There is no hepatomegaly, splenomegaly, mass or pulsatile mass.     Tenderness: There is no abdominal tenderness.  Musculoskeletal:        General: Normal range of motion.     Cervical back: Normal range of motion and neck supple.     Comments: Walking with walker  Lymphadenopathy:     Cervical: No cervical adenopathy.  Skin:    General: Skin is warm and dry.  Neurological:     Mental Status: She is alert and oriented to person, place, and time.     Deep Tendon Reflexes: Reflexes are normal and symmetric.  Psychiatric:        Behavior: Behavior normal.        Thought Content: Thought content normal.        Judgment: Judgment normal.    BP 134/82   Pulse (!) 50   Temp 97.6 F (36.4 C) (Temporal)   Resp 20   Ht $R'5\' 3"'Mz$  (1.6 m)   Wt 180 lb (81.6 kg)   SpO2 99%   BMI 31.89 kg/m   Urine clear     Assessment & Plan:  [name] comes in today with chief complaint of Medical Management of Chronic Issues   Diagnosis and orders addressed:  1. Essential hypertension Low sodium diet - CBC with Differential/Platelet - CMP14+EGFR - telmisartan (MICARDIS) 40 MG tablet; TAKE ONE (1) TABLET EACH DAY  (NEEDS TO BE SEEN BEFORE NEXT REFILL)  Dispense: 90 tablet; Refill: 1  2. Thoracic aorta atherosclerosis ([place]) Will  watch  3. Chronic obstructive pulmonary disease, unspecified COPD type ([place]) Report any breathing problems or chronic cough  4. Hypothyroidism (acquired) Labs pending - Thyroid Panel With TSH - levothyroxine (SYNTHROID) 50 MCG tablet; TAKE ONE (1) TABLET EACH DAY  Dispense: 90 tablet; Refill: 1  5. Renal insufficiency Labs pending  6. Pure hypercholesterolemia Low fat diet - Lipid panel  7. Anemia, unspecified type Labs pending  8. Anxiety Stress management - ALPRAZolam (XANAX) 0.5 MG tablet; Take 1 tablet (0.5 mg total) by mouth 2 (two) times daily as needed for anxiety (use SPARINGLY).  Dispense: 60 tablet; Refill: 2  9. Severe episode of recurrent major depressive disorder, without psychotic features ([place]) Added lexapro today May take 2-3 weeks to notice - escitalopram (LEXAPRO) 10 MG tablet; Take 1 tablet (10 mg total) by mouth daily.  Dispense: 30 tablet; Refill: 5  10. Vitamin D deficiency Continue daly vitamin d supplement  11. Hypokalemia Labs pending - potassium chloride SA (KLOR-CON) 20 MEQ tablet; Take 2 tablets (40 mEq total) by mouth daily. (NEEDS TO BE SEEN BEFORE NEXT REFILL)  Dispense: 90 tablet; Refill: 1  12. BMI 34.0-34.9,adult Discussed diet and exercise for person with BMI >25 Will recheck weight in 3-6 months  13. Urinary tract infection symptoms Urine clear - Urinalysis, Complete - Urine Culture   Labs pending Health Maintenance reviewed Diet and exercise encouraged  Follow up plan: 6 months   Loving, FNP

## 2021-03-01 NOTE — Patient Instructions (Signed)

## 2021-03-02 ENCOUNTER — Encounter (HOSPITAL_COMMUNITY): Payer: Medicare Other

## 2021-03-02 LAB — THYROID PANEL WITH TSH
Free Thyroxine Index: 2.2 (ref 1.2–4.9)
T3 Uptake Ratio: 23 % — ABNORMAL LOW (ref 24–39)
T4, Total: 9.5 ug/dL (ref 4.5–12.0)
TSH: 2.96 u[IU]/mL (ref 0.450–4.500)

## 2021-03-02 LAB — CBC WITH DIFFERENTIAL/PLATELET
Basophils Absolute: 0.1 10*3/uL (ref 0.0–0.2)
Basos: 1 %
EOS (ABSOLUTE): 0.2 10*3/uL (ref 0.0–0.4)
Eos: 3 %
Hematocrit: 32.4 % — ABNORMAL LOW (ref 34.0–46.6)
Hemoglobin: 9.9 g/dL — ABNORMAL LOW (ref 11.1–15.9)
Immature Grans (Abs): 0 10*3/uL (ref 0.0–0.1)
Immature Granulocytes: 0 %
Lymphocytes Absolute: 1.8 10*3/uL (ref 0.7–3.1)
Lymphs: 31 %
MCH: 24.9 pg — ABNORMAL LOW (ref 26.6–33.0)
MCHC: 30.6 g/dL — ABNORMAL LOW (ref 31.5–35.7)
MCV: 81 fL (ref 79–97)
Monocytes Absolute: 0.5 10*3/uL (ref 0.1–0.9)
Monocytes: 9 %
Neutrophils Absolute: 3.3 10*3/uL (ref 1.4–7.0)
Neutrophils: 56 %
Platelets: 416 10*3/uL (ref 150–450)
RBC: 3.98 x10E6/uL (ref 3.77–5.28)
RDW: 14.9 % (ref 11.7–15.4)
WBC: 5.9 10*3/uL (ref 3.4–10.8)

## 2021-03-02 LAB — CMP14+EGFR
ALT: 7 IU/L (ref 0–32)
AST: 16 IU/L (ref 0–40)
Albumin/Globulin Ratio: 1.6 (ref 1.2–2.2)
Albumin: 3.8 g/dL (ref 3.7–4.7)
Alkaline Phosphatase: 137 IU/L — ABNORMAL HIGH (ref 44–121)
BUN/Creatinine Ratio: 9 — ABNORMAL LOW (ref 12–28)
BUN: 9 mg/dL (ref 8–27)
Bilirubin Total: 0.4 mg/dL (ref 0.0–1.2)
CO2: 26 mmol/L (ref 20–29)
Calcium: 9.3 mg/dL (ref 8.7–10.3)
Chloride: 103 mmol/L (ref 96–106)
Creatinine, Ser: 0.96 mg/dL (ref 0.57–1.00)
Globulin, Total: 2.4 g/dL (ref 1.5–4.5)
Glucose: 82 mg/dL (ref 70–99)
Potassium: 3 mmol/L — ABNORMAL LOW (ref 3.5–5.2)
Sodium: 143 mmol/L (ref 134–144)
Total Protein: 6.2 g/dL (ref 6.0–8.5)
eGFR: 62 mL/min/{1.73_m2} (ref >59)

## 2021-03-02 LAB — LIPID PANEL
Chol/HDL Ratio: 2.8 ratio (ref 0.0–4.4)
Cholesterol, Total: 139 mg/dL (ref 100–199)
HDL: 49 mg/dL (ref >39)
LDL Chol Calc (NIH): 72 mg/dL (ref 0–99)
Triglycerides: 97 mg/dL (ref 0–149)
VLDL Cholesterol Cal: 18 mg/dL (ref 5–40)

## 2021-03-05 ENCOUNTER — Encounter (HOSPITAL_COMMUNITY): Payer: Medicare Other | Admitting: Physical Therapy

## 2021-03-06 LAB — URINE CULTURE

## 2021-03-07 ENCOUNTER — Other Ambulatory Visit: Payer: Self-pay

## 2021-03-07 ENCOUNTER — Encounter (HOSPITAL_COMMUNITY): Payer: Medicare Other

## 2021-03-07 ENCOUNTER — Encounter: Payer: Self-pay | Admitting: Physician Assistant

## 2021-03-07 ENCOUNTER — Ambulatory Visit (INDEPENDENT_AMBULATORY_CARE_PROVIDER_SITE_OTHER): Payer: Medicare Other | Admitting: Physician Assistant

## 2021-03-07 DIAGNOSIS — L570 Actinic keratosis: Secondary | ICD-10-CM

## 2021-03-07 DIAGNOSIS — Z1283 Encounter for screening for malignant neoplasm of skin: Secondary | ICD-10-CM | POA: Diagnosis not present

## 2021-03-09 ENCOUNTER — Encounter (HOSPITAL_COMMUNITY): Payer: Medicare Other

## 2021-03-09 ENCOUNTER — Encounter: Payer: Self-pay | Admitting: Physician Assistant

## 2021-03-09 NOTE — Progress Notes (Signed)
   New Patient   Subjective  [name] is a 75 y.o. female who presents for the following: Annual Exam (Right ear tender x months ).   The following portions of the chart were reviewed this encounter and updated as appropriate:  Tobacco  Allergies  Meds  Problems  Med Hx  Surg Hx  Fam Hx      Objective  Well appearing patient in no apparent distress; mood and affect are within normal limits.  A full examination was performed including scalp, head, eyes, ears, nose, lips, neck, chest, axillae, abdomen, back, buttocks, bilateral upper extremities, bilateral lower extremities, hands, feet, fingers, toes, fingernails, and toenails. All findings within normal limits unless otherwise noted below.   Assessment & Plan  AK (actinic keratosis) (10) Left Dorsal Hand (5); Right Dorsal Hand (3); Dorsum of Nose; Left Zygomatic Area  Destruction of lesion - Dorsum of Nose, Left Dorsal Hand, Left Zygomatic Area, Right Dorsal Hand Complexity: simple   Destruction method: cryotherapy   Informed consent: discussed and consent obtained   Timeout:  patient name, date of birth, surgical site, and procedure verified Lesion destroyed using liquid nitrogen: Yes   Cryotherapy cycles:  3 Outcome: patient tolerated procedure well with no complications       I, Keyleigh Manninen, PA-C, have reviewed all documentation's for this visit.  The documentation on 03/09/21 for the exam, diagnosis, procedures and orders are all accurate and complete.

## 2021-03-12 ENCOUNTER — Encounter (HOSPITAL_COMMUNITY): Payer: Medicare Other | Admitting: Physical Therapy

## 2021-03-14 ENCOUNTER — Encounter (HOSPITAL_COMMUNITY): Payer: Medicare Other

## 2021-03-16 ENCOUNTER — Encounter (HOSPITAL_COMMUNITY): Payer: Medicare Other

## 2021-03-19 ENCOUNTER — Encounter (HOSPITAL_COMMUNITY): Payer: Medicare Other | Admitting: Physical Therapy

## 2021-03-21 ENCOUNTER — Encounter (HOSPITAL_COMMUNITY): Payer: Medicare Other | Admitting: Physical Therapy

## 2021-03-22 ENCOUNTER — Encounter: Payer: Self-pay | Admitting: *Deleted

## 2021-03-23 ENCOUNTER — Encounter (HOSPITAL_COMMUNITY): Payer: Medicare Other | Admitting: Physical Therapy

## 2021-03-26 ENCOUNTER — Telehealth: Payer: Self-pay | Admitting: Nurse Practitioner

## 2021-04-10 ENCOUNTER — Ambulatory Visit: Payer: Medicare Other | Admitting: Family Medicine

## 2021-05-16 ENCOUNTER — Telehealth: Payer: Self-pay | Admitting: Nurse Practitioner

## 2021-05-16 NOTE — Telephone Encounter (Signed)
No answer unable to leave a message for patient to call back and schedule Medicare Annual Wellness Visit (AWV) to be completed by video or phone.  No hx of AWV eligible for AWVI as of  08/25/2012 per [facility]  Please schedule at anytime with [facility] --- Karle Starch  45 Minutes appointment   Any questions, please call me at [phone]

## 2021-06-07 ENCOUNTER — Encounter: Payer: Self-pay | Admitting: Cardiology

## 2021-06-07 ENCOUNTER — Ambulatory Visit (INDEPENDENT_AMBULATORY_CARE_PROVIDER_SITE_OTHER): Payer: Medicare Other | Admitting: Cardiology

## 2021-06-07 VITALS — BP 166/84 | HR 52 | Ht 64.0 in | Wt 181.8 lb

## 2021-06-07 DIAGNOSIS — I5032 Chronic diastolic (congestive) heart failure: Secondary | ICD-10-CM | POA: Diagnosis not present

## 2021-06-07 DIAGNOSIS — R600 Localized edema: Secondary | ICD-10-CM

## 2021-06-07 DIAGNOSIS — I10 Essential (primary) hypertension: Secondary | ICD-10-CM

## 2021-06-07 DIAGNOSIS — E876 Hypokalemia: Secondary | ICD-10-CM

## 2021-06-07 NOTE — Patient Instructions (Addendum)
Medication Instructions:  Continue all current medications.  Labwork: none  Testing/Procedures: BMET, CBC, Mg - orders given today Office will contact with results via phone or letter.     Follow-Up: 6 months   Any Other Special Instructions Will Be Listed Below (If Applicable). Please call the office on Monday with update on blood pressure readings.   If you need a refill on your cardiac medications before your next appointment, please call your pharmacy.

## 2021-06-07 NOTE — Progress Notes (Signed)
Clinical Summary [name] is a 76 y.o.female seen today for follow up of the following medical problems.    1. Leg edema/Chronic diastolic HF   -09/8097 LE venous US [place]: no DVT     09/2017 echo LVEF 60-65%, no WMAs, Diastolic function is abnormal, indeterminant grade   - was seen in lymphedema clinic, started on home pump - taking lasix 40mg  daily - she is down roughly 50 lbs since 2019 due to dietary changes  2.HTN - compliant with meds   Past Medical History:  Diagnosis Date   Allergy    Bronchitis, chronic (HCC)    Chronic anxiety    Complication of anesthesia    hard to wake up   COPD (chronic obstructive pulmonary disease) (HCC)    Depression    Esophageal reflux    Headache(784.0)    Hyperlipidemia    Hypertension    Hypothyroidism    Obesity    Osteoporosis    Overactive bladder    PVC's (premature ventricular contractions)    Thyroid disease    Vitamin D deficiency disease      Allergies  Allergen Reactions   Celebrex [Celecoxib] Swelling   Keflet [Cephalexin] Swelling   Penicillins Hives and Swelling    DID THE REACTION INVOLVE: Swelling of the face/tongue/throat, SOB, or low BP? Yes-swelling-hives Sudden or severe rash/hives, skin peeling, or the inside of the mouth or nose? Unknown Did it require medical treatment? Unknown When did it last happen?    over 10 years   If all above answers are "NO", may proceed with cephalosporin use.    Sulfa Antibiotics Swelling   Kenalog [Triamcinolone Acetonide]     unknown   Latex    Lisinopril Other (See Comments)    unknown   Mobic [Meloxicam]     SWELLING     Current Outpatient Medications  Medication Sig Dispense Refill   acetaminophen (TYLENOL) 650 MG CR tablet Take 650 mg by mouth every 8 (eight) hours as needed for pain.     ALPRAZolam (XANAX) 0.5 MG tablet Take 1 tablet (0.5 mg total) by mouth 2 (two) times daily as needed for anxiety (use SPARINGLY). 60 tablet 2   calcium carbonate  (OS-CAL - DOSED IN MG OF ELEMENTAL CALCIUM) 1250 (500 Ca) MG tablet Take 1 tablet by mouth once a week.      escitalopram (LEXAPRO) 10 MG tablet Take 1 tablet (10 mg total) by mouth daily. 30 tablet 5   furosemide (LASIX) 40 MG tablet Take 1.5 tablets (60 mg total) by mouth daily. CAN TAKE 80 MG FOR SWELLING 90 tablet 1   levothyroxine (SYNTHROID) 50 MCG tablet TAKE ONE (1) TABLET EACH DAY 90 tablet 1   Multiple Vitamin (MULTIVITAMIN) tablet Take 1 tablet by mouth 2 (two) times a week.      omeprazole-sodium bicarbonate (ZEGERID) 40-1100 MG capsule Take 1 capsule by mouth daily before breakfast.     potassium chloride SA (KLOR-CON) 20 MEQ tablet Take 2 tablets (40 mEq total) by mouth daily. (NEEDS TO BE SEEN BEFORE NEXT REFILL) 90 tablet 1   telmisartan (MICARDIS) 40 MG tablet TAKE ONE (1) TABLET EACH DAY  (NEEDS TO BE SEEN BEFORE NEXT REFILL) 90 tablet 1   No current facility-administered medications for this visit.     Past Surgical History:  Procedure Laterality Date   ABDOMINAL HYSTERECTOMY     CHOLECYSTECTOMY N/A 11/04/2013   Procedure: LAPAROSCOPIC CHOLECYSTECTOMY WITH INTRAOPERATIVE CHOLANGIOGRAM;  Surgeon: Imogene Burn. Tsuei,  MD;  Location: [facility];  Service: General;  Laterality: N/A;   COLONOSCOPY     FRACTURE SURGERY     pins removed from Hip surgery   HIP FRACTURE SURGERY  1990    pins removed in [facility]COPY       Allergies  Allergen Reactions   Celebrex [Celecoxib] Swelling   Keflet [Cephalexin] Swelling   Penicillins Hives and Swelling    DID THE REACTION INVOLVE: Swelling of the face/tongue/throat, SOB, or low BP? Yes-swelling-hives Sudden or severe rash/hives, skin peeling, or the inside of the mouth or nose? Unknown Did it require medical treatment? Unknown When did it last happen?    over 10 years   If all above answers are "NO", may proceed with cephalosporin use.    Sulfa Antibiotics  Swelling   Kenalog [Triamcinolone Acetonide]     unknown   Latex    Lisinopril Other (See Comments)    unknown   Mobic [Meloxicam]     SWELLING      Family History  Problem Relation Age of Onset   Arthritis Other    Heart disease Other    Cancer Other    Diabetes Other    OCD Other      Social History Ms. Addison reports that she has never smoked. She has never used smokeless tobacco. Ms. Kempe reports no history of alcohol use.   Review of Systems CONSTITUTIONAL: No weight loss, fever, chills, weakness or fatigue.  HEENT: Eyes: No visual loss, blurred vision, double vision or yellow sclerae.No hearing loss, sneezing, congestion, runny nose or sore throat.  SKIN: No rash or itching.  CARDIOVASCULAR: per hpi RESPIRATORY: No shortness of breath, cough or sputum.  GASTROINTESTINAL: No anorexia, nausea, vomiting or diarrhea. No abdominal pain or blood.  GENITOURINARY: No burning on urination, no polyuria NEUROLOGICAL: No headache, dizziness, syncope, paralysis, ataxia, numbness or tingling in the extremities. No change in bowel or bladder control.  MUSCULOSKELETAL: No muscle, back pain, joint pain or stiffness.  LYMPHATICS: No enlarged nodes. No history of splenectomy.  PSYCHIATRIC: No history of depression or anxiety.  ENDOCRINOLOGIC: No reports of sweating, cold or heat intolerance. No polyuria or polydipsia.  Marland Kitchen   Physical Examination Today's Vitals   06/07/21 1341  BP: (!) 166/84  Pulse: (!) 52  SpO2: 97%  Weight: 181 lb 12.8 oz (82.5 kg)  Height: 5\' 4"  (1.626 m)   Body mass index is 31.21 kg/m.  Gen: resting comfortably, no acute distress HEENT: no scleral icterus, pupils equal round and reactive, no palptable cervical adenopathy,  CV: RRR, no m/r/g no jvd Resp: Clear to auscultation bilaterally GI: abdomen is soft, non-tender, non-distended, normal bowel sounds, no hepatosplenomegaly MSK: extremities are warm, no edema.  Skin: warm, no rash Neuro:  no  focal deficits Psych: appropriate affect   Diagnostic Studies 09/2017 echo Study Conclusions   - Left ventricle: The cavity size was normal. Wall thickness was   increased in a pattern of mild LVH. Systolic function was normal.   The estimated ejection fraction was in the range of 60% to 65%.   Wall motion was normal; there were no regional wall motion   abnormalities. - Aortic valve: Mildly calcified annulus. Trileaflet; mildly   thickened leaflets. Mean gradient (S): 8 mm Hg. Valve area (VTI):   2.13 cm^2. Valve area (Vmax): 1.86 cm^2. Valve area (Vmean): 2.05   cm^2. - Left  atrium: The atrium was severely dilated. - Right atrium: The atrium was mildly dilated. - Technically adequate study.    Assessment and Plan  1. Leg edema/Chronic diastolic HF - supsect combined diastolic HF and lymphedema - careful diuretic dosing given prior AKI with higher doses. Swelling will only respond so much to diuretic as she also has significant lymphedema - swelling overall stable. In general has lost 50 lbs over the last 4 years with dietary changes. - she is using home lyphedema pump - continue current management  2. HTN - elevated today, prior visits has been at goal - she will call us with update home bp's on Monday. Room to titrate telmisartan if needed  3. Hypokalemia - repeat labs  4. Anemia - repeat labs   F/u 6 months   Arnoldo Lenis, M.D.

## 2021-06-08 ENCOUNTER — Other Ambulatory Visit: Payer: Self-pay | Admitting: Nurse Practitioner

## 2021-06-13 ENCOUNTER — Telehealth: Payer: Self-pay

## 2021-06-13 ENCOUNTER — Ambulatory Visit: Payer: Medicare Other

## 2021-06-13 ENCOUNTER — Telehealth: Payer: Self-pay | Admitting: Cardiology

## 2021-06-13 NOTE — Telephone Encounter (Signed)
Patient calling back to give her 3 days worth of bps  1/15: 153/84 HR 57 1/16: 149/81 HR 62 1/17: 149/74 HR 63  Please advise

## 2021-06-13 NOTE — Telephone Encounter (Signed)
Pt was scheduled for AWV today. Tried to reach patient by phone, lm for pt to call back to do visit. No return call from patient. Mjp,lpn

## 2021-06-14 MED ORDER — TELMISARTAN 80 MG PO TABS: 80.0000 mg | ORAL_TABLET | Freq: Every day | ORAL | 1 refills | Status: DC

## 2021-06-14 NOTE — Telephone Encounter (Signed)
BP too high, cans she increase telmisartan to 80mg  daily. Needs bmet 2 weeks   Zandra Abts MD

## 2021-06-14 NOTE — Telephone Encounter (Signed)
Patient informed and verbalized understanding of plan. Says she has lab orders already that were given at recent visit and she will go on 06/29/2021 to have done at [facility].

## 2021-07-16 ENCOUNTER — Ambulatory Visit: Payer: Medicare Other

## 2021-07-18 ENCOUNTER — Ambulatory Visit: Payer: Medicare Other

## 2021-07-30 ENCOUNTER — Telehealth: Payer: Self-pay | Admitting: Nurse Practitioner

## 2021-07-30 NOTE — Telephone Encounter (Signed)
No answer unable to leave a message for patient to call back and schedule Medicare Annual Wellness Visit (AWV) to be completed by video or phone. ? ?No hx of AWV eligible for AWVI per [facility] as of 08/25/2012 ? ?Please schedule at anytime with [place] --- Karle Starch ? ?39 Minutes appointment  ? ?Any questions, please call me at [phone]   ?

## 2021-08-09 ENCOUNTER — Telehealth: Payer: Self-pay

## 2021-08-09 NOTE — Telephone Encounter (Signed)
Pre-op covering staff, patient will need a telehealth visit to complete pre-op evaluation. Can you please call and consent patient for this and add to an APP pre-op schedule? ? ?Thank you! ?Darreld Mclean, PA-C ?08/09/2021 6:13 PM ? ?

## 2021-08-09 NOTE — Telephone Encounter (Signed)
? ?  Pre-operative Risk Assessment  ?  ?Patient Name: [name]  ?DOB: 06-30-1945 ?MRN: [id number]  ? ?  ? ?Request for Surgical Clearance   ? ?Procedure:   Bilateral knee steroid injections ? ?Date of Surgery:  Clearance 08/14/21                              ?   ?Surgeon:  Rennis Petty ?Surgeon's Group or Practice Name:  [facility] ?Phone number:  [phone] ?Fax number:  [phone] ?  ?Type of Clearance Requested:   ?Cardiac clearance for steroid injections ?  ?Type of Anesthesia:  Not Indicated ?  ?Additional requests/questions:  NA ? ?Signed, ?Sung Amabile   ?08/09/2021, 3:56 PM   ?

## 2021-08-10 ENCOUNTER — Telehealth: Payer: Self-pay | Admitting: *Deleted

## 2021-08-10 ENCOUNTER — Telehealth: Payer: Self-pay | Admitting: Nurse Practitioner

## 2021-08-10 NOTE — Telephone Encounter (Signed)
Pt agreeable to plan of care for tele pre op appt 08/13/21. Med rec and consent are done. ?

## 2021-08-10 NOTE — Telephone Encounter (Signed)
?  Left message for patient to call back and schedule Medicare Annual Wellness Visit (AWV) to be completed by video or phone. ? ?No hx of AWV eligible for AWVI per [facility] as of 08/25/2012 ? ?Please schedule at anytime with [place] --- Karle Starch ? ?2 Minutes appointment  ? ?Any questions, please call me at [phone]   ?

## 2021-08-10 NOTE — Telephone Encounter (Signed)
?  Patient Consent for Virtual Visit  ? ? ?   ? ?[name] has provided verbal consent on 08/10/2021 for a virtual visit (video or telephone). ? ? ?CONSENT FOR VIRTUAL VISIT FOR:  [name]  ?By participating in this virtual visit I agree to the following: ? ?I hereby voluntarily request, consent and authorize [facility] and its employed or contracted physicians, physician assistants, nurse practitioners or other licensed health care professionals (the Practitioner), to provide me with telemedicine health care services (the ?Services") as deemed necessary by the treating Practitioner. I acknowledge and consent to receive the Services by the Practitioner via telemedicine. I understand that the telemedicine visit will involve communicating with the Practitioner through live audiovisual communication technology and the disclosure of certain medical information by electronic transmission. I acknowledge that I have been given the opportunity to request an in-person assessment or other available alternative prior to the telemedicine visit and am voluntarily participating in the telemedicine visit. ? ?I understand that I have the right to withhold or withdraw my consent to the use of telemedicine in the course of my care at any time, without affecting my right to future care or treatment, and that the Practitioner or I may terminate the telemedicine visit at any time. I understand that I have the right to inspect all information obtained and/or recorded in the course of the telemedicine visit and may receive copies of available information for a reasonable fee.  I understand that some of the potential risks of receiving the Services via telemedicine include:  ?Delay or interruption in medical evaluation due to technological equipment failure or disruption; ?Information transmitted may not be sufficient (e.g. poor resolution of images) to allow for appropriate medical decision making by the Practitioner; and/or   ?In rare instances, security protocols could fail, causing a breach of personal health information. ? ?Furthermore, I acknowledge that it is my responsibility to provide information about my medical history, conditions and care that is complete and accurate to the best of my ability. I acknowledge that Practitioner's advice, recommendations, and/or decision may be based on factors not within their control, such as incomplete or inaccurate data provided by me or distortions of diagnostic images or specimens that may result from electronic transmissions. I understand that the practice of medicine is not an exact science and that Practitioner makes no warranties or guarantees regarding treatment outcomes. I acknowledge that a copy of this consent can be made available to me via my patient portal ([place]), or I can request a printed copy by calling the office of [facility].   ? ?I understand that my insurance will be billed for this visit.  ? ?I have read or had this consent read to me. ?I understand the contents of this consent, which adequately explains the benefits and risks of the Services being provided via telemedicine.  ?I have been provided ample opportunity to ask questions regarding this consent and the Services and have had my questions answered to my satisfaction. ?I give my informed consent for the services to be provided through the use of telemedicine in my medical care ? ? ? ?

## 2021-08-13 ENCOUNTER — Ambulatory Visit (INDEPENDENT_AMBULATORY_CARE_PROVIDER_SITE_OTHER): Payer: Medicare Other | Admitting: Physician Assistant

## 2021-08-13 ENCOUNTER — Other Ambulatory Visit: Payer: Self-pay

## 2021-08-13 ENCOUNTER — Encounter: Payer: Self-pay | Admitting: Physician Assistant

## 2021-08-13 DIAGNOSIS — Z01810 Encounter for preprocedural cardiovascular examination: Secondary | ICD-10-CM | POA: Diagnosis not present

## 2021-08-13 NOTE — Progress Notes (Signed)
? ?Virtual Visit via Telephone Note  ? ?This visit type was conducted due to national recommendations for restrictions regarding the COVID-19 Pandemic (e.g. social distancing) in an effort to limit this patient's exposure and mitigate transmission in our community.  Due to her co-morbid illnesses, this patient is at least at moderate risk for complications without adequate follow up.  This format is felt to be most appropriate for this patient at this time.  The patient did not have access to video technology/had technical difficulties with video requiring transitioning to audio format only (telephone).  All issues noted in this document were discussed and addressed.  No physical exam could be performed with this format.  Please refer to the patient's chart for her  consent to telehealth for [facility]. ?Evaluation Performed:  Preoperative cardiovascular risk assessment ? ?This visit type was conducted due to national recommendations for restrictions regarding the COVID-19 Pandemic (e.g. social distancing).  This format is felt to be most appropriate for this patient at this time.  All issues noted in this document were discussed and addressed.  No physical exam was performed (except for noted visual exam findings with Video Visits).  Please refer to the patient's chart (MyChart message for video visits and phone note for telephone visits) for the patient's consent to telehealth for [facility]. ?_____________  ? ?Date:  08/13/2021  ? ?Patient ID:  [name], DOB [birth year], MRN [id number] ?Patient Location:  ?Home ?Provider location:   ?Office ? ?Primary Care Provider:  Chevis Pretty, FNP ?Primary Cardiologist:  Carlyle Dolly, MD ? ?Chief Complaint  ?  ?76 y.o. y/o female with a h/o HFpEF, leg edema and hypertension, who is pending Bilateral knee steroid injections, and presents today for telephonic preoperative cardiovascular risk assessment. ? ?Past Medical History  ?  ?Past Medical History:   ?Diagnosis Date  ? Allergy   ? Bronchitis, chronic ([place])   ? Chronic anxiety   ? Complication of anesthesia   ? hard to wake up  ? COPD (chronic obstructive pulmonary disease) ([place])   ? Depression   ? Esophageal reflux   ? Headache(784.0)   ? Hyperlipidemia   ? Hypertension   ? Hypothyroidism   ? Obesity   ? Osteoporosis   ? Overactive bladder   ? PVC's (premature ventricular contractions)   ? Thyroid disease   ? Vitamin D deficiency disease   ? ?Past Surgical History:  ?Procedure Laterality Date  ? ABDOMINAL HYSTERECTOMY    ? CHOLECYSTECTOMY N/A 11/04/2013  ? Procedure: LAPAROSCOPIC CHOLECYSTECTOMY WITH INTRAOPERATIVE CHOLANGIOGRAM;  Surgeon: Imogene Burn. Georgette Dover, MD;  Location: [facility];  Service: General;  Laterality: N/A;  ? COLONOSCOPY    ? FRACTURE SURGERY    ? pins removed from Hip surgery  ? [place]   ? pins removed in 1991  ? PARTIAL HYSTERECTOMY  1987  ? SPINAL FUSION    ? UPPER GASTROINTESTINAL ENDOSCOPY    ? ? ?Allergies ? ?Allergies  ?Allergen Reactions  ? Celebrex [Celecoxib] Swelling  ? Keflet [Cephalexin] Swelling  ? Penicillins Hives and Swelling  ?  DID THE REACTION INVOLVE: Swelling of the face/tongue/throat, SOB, or low BP? Yes-swelling-hives ?Sudden or severe rash/hives, skin peeling, or the inside of the mouth or nose? Unknown ?Did it require medical treatment? Unknown ?When did it last happen?    over 10 years   ?If all above answers are "NO", may proceed with cephalosporin use. ?  ? Sulfa Antibiotics Swelling  ? Kenalog [Triamcinolone Acetonide]   ?  unknown  ? Latex   ? Lisinopril Other (See Comments)  ?  unknown  ? Mobic [Meloxicam]   ?  SWELLING  ? ? ?History of Present Illness  ?  ?[name] is a 76 y.o. female who presents via [employer] (consulting) for a telehealth visit today.  Pt was last seen in cardiology clinic on 06/07/21, by Dr. Harl Bowie.  At that time [name] was doing well.  she is now pending bilateral knee steroid injections.  Since his last visit, her  mobility is limited by bilateral knee pain. She ambulates with a walker. She does not have a history of ischemic heart disease. She has HFpEF and lymphedema. She has had several rounds of knee injections in the past without complications.  ? ? ?Home Medications  ?  ?Prior to Admission medications   ?Medication Sig Start Date End Date Taking? Authorizing Provider  ?acetaminophen (TYLENOL) 650 MG CR tablet Take 650 mg by mouth every 8 (eight) hours as needed for pain.    [provider]  ?ALPRAZolam Duanne Moron) 0.5 MG tablet Take 1 tablet (0.5 mg total) by mouth 2 (two) times daily as needed for anxiety (use SPARINGLY). 03/01/21   Chevis Pretty, FNP  ?calcium carbonate (OS-CAL - DOSED IN MG OF ELEMENTAL CALCIUM) 1250 (500 Ca) MG tablet Take 1 tablet by mouth once a week.     [provider]  ?Cholecalciferol (VITAMIN D3) 10 MCG (400 UNIT) CAPS Take 1 capsule by mouth daily.    [provider]  ?escitalopram (LEXAPRO) 10 MG tablet Take 1 tablet (10 mg total) by mouth daily. 03/01/21   Chevis Pretty, FNP  ?furosemide (LASIX) 40 MG tablet TAKE 1&1/2 TABLETS ONCE DAILY. MAY TAKE 2 TABLETS FOR SWELLING 06/08/21   Chevis Pretty, FNP  ?levothyroxine (SYNTHROID) 50 MCG tablet TAKE ONE (1) TABLET EACH DAY 03/01/21   Chevis Pretty, FNP  ?Multiple Vitamin (MULTIVITAMIN) tablet Take 1 tablet by mouth 2 (two) times a week.     [provider]  ?omeprazole-sodium bicarbonate (ZEGERID) 40-1100 MG capsule Take 1 capsule by mouth daily before breakfast. ?Patient taking differently: Take 1 capsule by mouth as needed. 02/25/20   Chevis Pretty, FNP  ?potassium chloride SA (KLOR-CON) 20 MEQ tablet Take 2 tablets (40 mEq total) by mouth daily. (NEEDS TO BE SEEN BEFORE NEXT REFILL) 03/01/21   Chevis Pretty, FNP  ?telmisartan (MICARDIS) 80 MG tablet Take 1 tablet (80 mg total) by mouth daily. 06/14/21   Arnoldo Lenis, MD  ? ? ?Physical Exam  ?  ?Vital Signs:   [name] does not have vital signs available for review today. ? ?Given telephonic nature of communication, physical exam is limited. ?AAOx3. NAD. Normal affect.  Speech and respirations are unlabored. ? ?Accessory Clinical Findings  ?  ?None ? ?Assessment & Plan  ?  ?1.  Preoperative Cardiovascular Risk Assessment: ? ?She is unable to complete 4.0 METS due to knee pain. She has had knee injections in the past without complications. This is a low risk procedure that does not require anesthesia. She has no cardiac complaints. She is at acceptable risk to proceed with knee injections without further cardiac testing.  ? ? ? ?COVID-19 Education: ?The signs and symptoms of COVID-19 were discussed with the patient and how to seek care for testing (follow up with PCP or arrange E-visit).  The importance of social distancing was discussed today. ? ?Patient Risk:   ?After full review of this patient's history and clinical status, I feel  that he is at least moderate risk for cardiac complications at this time, thus necessitating a telehealth visit sooner than our first available in office visit. ? ?Time:   ?Today, I have spent 10 minutes with the patient with telehealth technology discussing medical history, symptoms, and management plan.   ? ? ?Ledora Bottcher, PA ? ?08/13/2021, 10:56 AM ?

## 2021-08-23 ENCOUNTER — Telehealth: Payer: Self-pay | Admitting: Cardiology

## 2021-08-23 DIAGNOSIS — I10 Essential (primary) hypertension: Secondary | ICD-10-CM

## 2021-08-23 DIAGNOSIS — M170 Bilateral primary osteoarthritis of knee: Secondary | ICD-10-CM | POA: Diagnosis not present

## 2021-08-23 DIAGNOSIS — Z79899 Other long term (current) drug therapy: Secondary | ICD-10-CM

## 2021-08-23 DIAGNOSIS — I5032 Chronic diastolic (congestive) heart failure: Secondary | ICD-10-CM

## 2021-08-23 NOTE — Telephone Encounter (Signed)
Pt going to [facility] for her visit w/ her PCP and would like her lab orders to be sent there. ? ?She would like to know when they receive the orders  ? ?[phone] cell or house [phone] ?

## 2021-08-24 NOTE — Telephone Encounter (Signed)
Left message to return call 

## 2021-08-29 NOTE — Telephone Encounter (Signed)
Advised that orders placed for [facility] at [facility] ?

## 2021-08-31 ENCOUNTER — Other Ambulatory Visit: Payer: Self-pay | Admitting: Nurse Practitioner

## 2021-08-31 DIAGNOSIS — F419 Anxiety disorder, unspecified: Secondary | ICD-10-CM

## 2021-09-03 NOTE — Telephone Encounter (Signed)
Needs appt

## 2021-09-05 ENCOUNTER — Ambulatory Visit: Payer: Medicare Other | Admitting: Physician Assistant

## 2021-09-07 ENCOUNTER — Ambulatory Visit: Payer: Medicare Other | Admitting: Family Medicine

## 2021-09-10 ENCOUNTER — Ambulatory Visit: Payer: Medicare Other | Admitting: Family Medicine

## 2021-09-10 ENCOUNTER — Other Ambulatory Visit: Payer: Medicare Other

## 2021-09-10 DIAGNOSIS — I10 Essential (primary) hypertension: Secondary | ICD-10-CM | POA: Diagnosis not present

## 2021-09-10 DIAGNOSIS — Z79899 Other long term (current) drug therapy: Secondary | ICD-10-CM | POA: Diagnosis not present

## 2021-09-10 DIAGNOSIS — I5032 Chronic diastolic (congestive) heart failure: Secondary | ICD-10-CM | POA: Diagnosis not present

## 2021-09-11 LAB — BASIC METABOLIC PANEL
BUN/Creatinine Ratio: 10 — ABNORMAL LOW (ref 12–28)
BUN: 9 mg/dL (ref 8–27)
CO2: 30 mmol/L — ABNORMAL HIGH (ref 20–29)
Calcium: 8.6 mg/dL — ABNORMAL LOW (ref 8.7–10.3)
Chloride: 97 mmol/L (ref 96–106)
Creatinine, Ser: 0.93 mg/dL (ref 0.57–1.00)
Glucose: 102 mg/dL — ABNORMAL HIGH (ref 70–99)
Potassium: 2.6 mmol/L — ABNORMAL LOW (ref 3.5–5.2)
Sodium: 143 mmol/L (ref 134–144)
eGFR: 64 mL/min/{1.73_m2} (ref >59)

## 2021-09-11 LAB — CBC
Hematocrit: 30.6 % — ABNORMAL LOW (ref 34.0–46.6)
Hemoglobin: 9.6 g/dL — ABNORMAL LOW (ref 11.1–15.9)
MCH: 24.9 pg — ABNORMAL LOW (ref 26.6–33.0)
MCHC: 31.4 g/dL — ABNORMAL LOW (ref 31.5–35.7)
MCV: 80 fL (ref 79–97)
Platelets: 423 10*3/uL (ref 150–450)
RBC: 3.85 x10E6/uL (ref 3.77–5.28)
RDW: 14.7 % (ref 11.7–15.4)
WBC: 5.7 10*3/uL (ref 3.4–10.8)

## 2021-09-11 LAB — MAGNESIUM: Magnesium: 1.8 mg/dL (ref 1.6–2.3)

## 2021-09-17 ENCOUNTER — Telehealth (HOSPITAL_BASED_OUTPATIENT_CLINIC_OR_DEPARTMENT_OTHER): Payer: Self-pay | Admitting: *Deleted

## 2021-09-17 ENCOUNTER — Other Ambulatory Visit (HOSPITAL_BASED_OUTPATIENT_CLINIC_OR_DEPARTMENT_OTHER): Payer: Self-pay | Admitting: *Deleted

## 2021-09-17 DIAGNOSIS — E876 Hypokalemia: Secondary | ICD-10-CM

## 2021-09-17 DIAGNOSIS — Z79899 Other long term (current) drug therapy: Secondary | ICD-10-CM

## 2021-09-17 NOTE — Telephone Encounter (Signed)
-----   Message from Arnoldo Lenis, MD sent at 09/17/2021  9:29 AM EDT ----- ?Potassium is low. Clarify she is taking potassium chloride '40mg'$  daily. If so increase to 21mq bid, recheck bmet in 2 weeks.  ? ?JZandra AbtsMD ?

## 2021-09-17 NOTE — Telephone Encounter (Signed)
Patient informed and confirmed that she has not been taking her potassium. Says she takes 1 tablet every once in awhile. Advised to starting taking potassium 40 meq daily as prescribed and repeat BMET in 2 weeks. Says she will have lab repeated around 10/01/2021 at [facility]. Copy sent to PCP ?

## 2021-09-20 ENCOUNTER — Encounter: Payer: Self-pay | Admitting: Family Medicine

## 2021-09-20 ENCOUNTER — Ambulatory Visit (INDEPENDENT_AMBULATORY_CARE_PROVIDER_SITE_OTHER): Payer: Medicare Other | Admitting: Family Medicine

## 2021-09-20 VITALS — BP 153/79 | HR 62 | Temp 98.2°F | Ht 64.0 in | Wt 179.2 lb

## 2021-09-20 DIAGNOSIS — I878 Other specified disorders of veins: Secondary | ICD-10-CM

## 2021-09-20 DIAGNOSIS — L03115 Cellulitis of right lower limb: Secondary | ICD-10-CM

## 2021-09-20 MED ORDER — DOXYCYCLINE HYCLATE 100 MG PO TABS: 100.0000 mg | ORAL_TABLET | Freq: Two times a day (BID) | ORAL | 0 refills | Status: DC

## 2021-09-20 NOTE — Patient Instructions (Signed)
Unna Boot Care An Unna boot is a type of bandage (dressing) for the foot and leg. The dressing is a gauze wrap that is soaked with a type of medicine called zinc oxide. The gauze may also include other lotions and medicines that help in wound healing, such as calamine. An Unna boot may be used to treat: Open sores (ulcers) on the foot, heel, or leg. Swelling from disorders that affect the veins or lymphatic system (lymphedema). Skin conditions such as chronic inflammation caused by poor blood flow (stasis dermatitis). The dressing is applied by a health care provider. The gauze is wrapped around your lower extremity in several layers, usually starting at the toes and going upward to the knee. A dry outer wrap goes over the medicated wrap for support and compression.  Before applying the Unna boot, your health care provider will clean your leg and foot and may apply an antibiotic ointment. You may be asked to raise (elevate) your leg for a while to reduce swelling before the boot is applied. The boot will dry and harden after it is applied. The boot may need to be changed or replaced about twice a week. Follow these instructions at home: Boot care Wear the Unna boot as told by your health care provider. You may need to wear a slipper or shoe over the boot that is one or two sizes larger than normal. Check the skin around the boot every day. Tell your health care provider about any concerns. Do not stick anything inside the boot to scratch your skin. Doing that increases your risk of infection. Keep your Unna boot clean and dry. Check every day for signs of infection. Check for: Redness, swelling, or pain in your foot or toes. Fluid or blood coming from the boot. Pus or a bad smell coming from the boot. Remove the boot and call your health care provider if you have signs of poor blood flow, such as: Your toes tingle or become numb. Your toes turn cold or turn blue or pale. Your toes are more  swollen or painful. You are unable to move your toes. Activity You may walk with the boot once it has dried. Ask your health care provider how much walking is safe for you. Avoid sitting for a long time without moving. Get up to take short walks as told by your health care provider. This is important to improve blood flow. Bathing Do not take baths, swim, or use a hot tub until your health care provider approves. Ask your health care provider if you may take showers. If your health care provider approves a bath or a shower, do not let the Unna boot get wet. If you take a shower, cover the boot with a watertight covering. If you take a bath, keep your leg with the boot out of the tub. General instructions Keep your leg elevated above the level of your heart while you are sitting or lying down. This will decrease swelling. Do not sit with your knee bent for long periods of time. Take over-the-counter and prescription medicines only as told by your health care provider. Do not use any products that contain nicotine or tobacco, such as cigarettes, e-cigarettes, and chewing tobacco. These can delay healing. If you need help quitting, ask your health care provider. Keep all follow-up visits as told by your health care provider. This is important. Contact a health care provider if: Your skin feels itchy inside the boot. You have a burning sensation, a   rash, or itchy, red, swollen areas of skin (hives) in the boot area. You have a fever or chills. You have any signs of infection, such as: New redness, swelling, or pain. More fluid or blood coming from the boot. Pus or a bad smell coming from the boot. You have increased numbness or pain in your foot or toes. You have any changes in skin color on your foot or toes, such as the skin turning blue or pale or developing patchy areas with spots. Your boot has been damaged or feels like it is no longer fitting properly. Summary An Unna boot is a type of  bandage (dressing) system for the foot and leg. The dressing is a gauze wrap that is soaked with a type of medicine (zinc oxide) to treat foot, heel, or leg ulcers, swelling from disorders that affect the veins or lymphatic system (lymphedema), and skin conditions caused by poor blood flow (stasis dermatitis). This dressing is applied by a health care provider. After it is applied, the boot will dry and harden. The boot may need to be changed or replaced about twice a week. Let your health care provider know if you have any signs of poor blood flow or infection. This information is not intended to replace advice given to you by your health care provider. Make sure you discuss any questions you have with your health care provider. Document Revised: 03/08/2021 Document Reviewed: 03/08/2021 Elsevier Patient Education  2023 Elsevier Inc.  

## 2021-09-20 NOTE — Progress Notes (Signed)
? ?  Acute Office Visit ? ?Subjective:  ? ?  ?Patient ID: [name], female    DOB: 01-31-1946, 76 y.o.   MRN: [id number] ? ?Chief Complaint  ?Patient presents with  ? Skin Ulcer  ? Cellulitis  ? ? ?HPI ?Patient is in today for an ulcer on her right lower leg. She started 3 days ago. It is weeping. She has been pressing on her leg to help the drainage. There is redness and tenderness around the ulcer. She has a history of lymphedema. She uses compression pumps but has not used these since she noticed the ulcers. She denies fever or chills. She was applying a bandage with neosporin but stopped as the bandage was tearing her skin.  ? ? ?ROS ?As per HPI.  ? ?   ?Objective:  ?  ?BP (!) 153/79   Pulse 62   Temp 98.2 ?F (36.8 ?C) (Temporal)   Ht '5\' 4"'$  (1.626 m)   Wt 179 lb 4 oz (81.3 kg)   BMI 30.77 kg/m?  ?BP Readings from Last 3 Encounters:  ?09/20/21 (!) 153/79  ?06/07/21 (!) 166/84  ?03/01/21 134/82  ? ?  ? ?Physical Exam ?Vitals and nursing note reviewed.  ?Constitutional:   ?   General: She is not in acute distress. ?   Appearance: She is not ill-appearing, toxic-appearing or diaphoretic.  ?Cardiovascular:  ?   Rate and Rhythm: Normal rate and regular rhythm.  ?Pulmonary:  ?   Effort: Pulmonary effort is normal. No respiratory distress.  ?Musculoskeletal:  ?   Right lower leg: 4+ Pitting Edema present.  ?   Left lower leg: 4+ Pitting Edema present.  ?Skin: ?   Findings: Wound (5 cm x 2 cm shallow ulcer to right anterior lower leg. Surrounding erythema, warmth, and tenderness. Clear drainage present.) present.  ?Neurological:  ?   Mental Status: She is alert and oriented to person, place, and time.  ?   Gait: Gait abnormal (walker).  ?Psychiatric:     ?   Mood and Affect: Mood normal.     ?   Behavior: Behavior normal.  ? ? ?No results found for any visits on 09/20/21. ? ? ?   ?Assessment & Plan:  ? ?Allena was seen today for skin ulcer and cellulitis. ? ?Diagnoses and all orders for this visit: ? ?Venous stasis  of lower extremity ?Unna boot applied today. Discussed care. Do not sure compression pump with unna boot. Follow up in 4 days.  ?-     Apply unna boot ? ?Cellulitis of right lower extremity ?Doxycycline ordered.  ?-     doxycycline (VIBRA-TABS) 100 MG tablet; Take 1 tablet (100 mg total) by mouth 2 (two) times daily for 7 days. ? ?Return in about 4 days (around 09/24/2021). Sooner for new or worsening symptoms.  ? ?The patient indicates understanding of these issues and agrees with the plan. ? ?Gwenlyn Perking, FNP ? ? ?

## 2021-09-24 ENCOUNTER — Encounter: Payer: Self-pay | Admitting: Family Medicine

## 2021-09-24 ENCOUNTER — Ambulatory Visit (INDEPENDENT_AMBULATORY_CARE_PROVIDER_SITE_OTHER): Payer: Medicare Other | Admitting: Family Medicine

## 2021-09-24 VITALS — BP 139/68 | HR 76 | Temp 97.5°F | Ht 64.0 in | Wt 168.5 lb

## 2021-09-24 DIAGNOSIS — L03115 Cellulitis of right lower limb: Secondary | ICD-10-CM

## 2021-09-24 DIAGNOSIS — I878 Other specified disorders of veins: Secondary | ICD-10-CM | POA: Diagnosis not present

## 2021-09-24 MED ORDER — CLINDAMYCIN HCL 300 MG PO CAPS: 300.0000 mg | ORAL_CAPSULE | Freq: Four times a day (QID) | ORAL | 0 refills | Status: AC

## 2021-09-24 NOTE — Progress Notes (Signed)
? ?  Acute Office Visit ? ?Subjective:  ? ?  ?Patient ID: [name], female    DOB: 06-04-1945, 76 y.o.   MRN: [id number] ? ?Chief Complaint  ?Patient presents with  ? Venous Stasis Ulcer  ? ? ?HPI ?Patient is in today for follow up of venous ulcer and cellulitis. She was seen on 09/20/21 and an unna boot was applied to her right lower leg. She was started on doxycyline. She has been taking this, but it does give her diarrhea. She denies fever or chills. She has lost 11 lbs since her last visit. She has been taking 60 mg of lasix daily. She has a history of lymphedema. She has been using her compression pump on her left lower leg. Denies fever, chills, chest pain, or shortness of breath.  ? ?ROS ?As per HPI.  ? ?   ?Objective:  ?  ?BP 139/68   Pulse 76   Temp (!) 97.5 ?F (36.4 ?C) (Temporal)   Ht '5\' 4"'$  (1.626 m)   Wt 168 lb 8 oz (76.4 kg)   BMI 28.92 kg/m?  ?BP Readings from Last 3 Encounters:  ?09/24/21 139/68  ?09/20/21 (!) 153/79  ?06/07/21 (!) 166/84  ? ?  ? ?Physical Exam ?Vitals and nursing note reviewed.  ?Constitutional:   ?   General: She is not in acute distress. ?   Appearance: She is not ill-appearing, toxic-appearing or diaphoretic.  ?Cardiovascular:  ?   Rate and Rhythm: Normal rate and regular rhythm.  ?   Heart sounds: Normal heart sounds. No murmur heard. ?Pulmonary:  ?   Effort: Pulmonary effort is normal. No respiratory distress.  ?   Breath sounds: Normal breath sounds.  ?Musculoskeletal:  ?   Right lower leg: 2+ Edema present.  ?   Left lower leg: 2+ Edema present.  ?Skin: ?   Findings: Wound present.  ?   Comments: 4.5 cm x 2 cm shallow ulcer to right anterior lower leg. Decreased surrounding erythema from previous. No tenderness or drainage present.   ?Neurological:  ?   Mental Status: She is alert.  ? ? ?No results found for any visits on 09/24/21. ? ? ?   ?Assessment & Plan:  ? ?[name] was seen today for venous stasis ulcer. ? ?Diagnoses and all orders for this visit: ? ?Venous stasis of  lower extremity ?Cellulitis of right lower extremity ?Both improving. Will switch from doxy to clinda due to side effects. Unna boot reapplied today.  ?-     clindamycin (CLEOCIN) 300 MG capsule; Take 1 capsule (300 mg total) by mouth 4 (four) times daily for 7 days. ?-     Apply unna boot ? ? ?Return in about 3 days (around 09/27/2021) for unna boot. ? ?Gwenlyn Perking, FNP ? ? ?

## 2021-09-24 NOTE — Patient Instructions (Signed)
Unna Boot Care An Unna boot is a type of bandage (dressing) for the foot and leg. The dressing is a gauze wrap that is soaked with a type of medicine called zinc oxide. The gauze may also include other lotions and medicines that help in wound healing, such as calamine. An Unna boot may be used to treat: Open sores (ulcers) on the foot, heel, or leg. Swelling from disorders that affect the veins or lymphatic system (lymphedema). Skin conditions such as chronic inflammation caused by poor blood flow (stasis dermatitis). The dressing is applied by a health care provider. The gauze is wrapped around your lower extremity in several layers, usually starting at the toes and going upward to the knee. A dry outer wrap goes over the medicated wrap for support and compression.  Before applying the Unna boot, your health care provider will clean your leg and foot and may apply an antibiotic ointment. You may be asked to raise (elevate) your leg for a while to reduce swelling before the boot is applied. The boot will dry and harden after it is applied. The boot may need to be changed or replaced about twice a week. Follow these instructions at home: Boot care Wear the Unna boot as told by your health care provider. You may need to wear a slipper or shoe over the boot that is one or two sizes larger than normal. Check the skin around the boot every day. Tell your health care provider about any concerns. Do not stick anything inside the boot to scratch your skin. Doing that increases your risk of infection. Keep your Unna boot clean and dry. Check every day for signs of infection. Check for: Redness, swelling, or pain in your foot or toes. Fluid or blood coming from the boot. Pus or a bad smell coming from the boot. Remove the boot and call your health care provider if you have signs of poor blood flow, such as: Your toes tingle or become numb. Your toes turn cold or turn blue or pale. Your toes are more  swollen or painful. You are unable to move your toes. Activity You may walk with the boot once it has dried. Ask your health care provider how much walking is safe for you. Avoid sitting for a long time without moving. Get up to take short walks as told by your health care provider. This is important to improve blood flow. Bathing Do not take baths, swim, or use a hot tub until your health care provider approves. Ask your health care provider if you may take showers. If your health care provider approves a bath or a shower, do not let the Unna boot get wet. If you take a shower, cover the boot with a watertight covering. If you take a bath, keep your leg with the boot out of the tub. General instructions Keep your leg elevated above the level of your heart while you are sitting or lying down. This will decrease swelling. Do not sit with your knee bent for long periods of time. Take over-the-counter and prescription medicines only as told by your health care provider. Do not use any products that contain nicotine or tobacco, such as cigarettes, e-cigarettes, and chewing tobacco. These can delay healing. If you need help quitting, ask your health care provider. Keep all follow-up visits as told by your health care provider. This is important. Contact a health care provider if: Your skin feels itchy inside the boot. You have a burning sensation, a   rash, or itchy, red, swollen areas of skin (hives) in the boot area. You have a fever or chills. You have any signs of infection, such as: New redness, swelling, or pain. More fluid or blood coming from the boot. Pus or a bad smell coming from the boot. You have increased numbness or pain in your foot or toes. You have any changes in skin color on your foot or toes, such as the skin turning blue or pale or developing patchy areas with spots. Your boot has been damaged or feels like it is no longer fitting properly. Summary An Unna boot is a type of  bandage (dressing) system for the foot and leg. The dressing is a gauze wrap that is soaked with a type of medicine (zinc oxide) to treat foot, heel, or leg ulcers, swelling from disorders that affect the veins or lymphatic system (lymphedema), and skin conditions caused by poor blood flow (stasis dermatitis). This dressing is applied by a health care provider. After it is applied, the boot will dry and harden. The boot may need to be changed or replaced about twice a week. Let your health care provider know if you have any signs of poor blood flow or infection. This information is not intended to replace advice given to you by your health care provider. Make sure you discuss any questions you have with your health care provider. Document Revised: 03/08/2021 Document Reviewed: 03/08/2021 Elsevier Patient Education  2023 Elsevier Inc.  

## 2021-09-27 ENCOUNTER — Encounter: Payer: Self-pay | Admitting: Nurse Practitioner

## 2021-09-27 ENCOUNTER — Ambulatory Visit: Payer: Medicare Other | Admitting: Family Medicine

## 2021-09-28 ENCOUNTER — Encounter: Payer: Self-pay | Admitting: Nurse Practitioner

## 2021-09-28 ENCOUNTER — Ambulatory Visit (INDEPENDENT_AMBULATORY_CARE_PROVIDER_SITE_OTHER): Payer: Medicare Other | Admitting: Nurse Practitioner

## 2021-09-28 VITALS — BP 139/77 | HR 51 | Temp 97.3°F | Resp 20 | Ht 64.0 in | Wt 164.0 lb

## 2021-09-28 DIAGNOSIS — E876 Hypokalemia: Secondary | ICD-10-CM

## 2021-09-28 DIAGNOSIS — I10 Essential (primary) hypertension: Secondary | ICD-10-CM

## 2021-09-28 DIAGNOSIS — R600 Localized edema: Secondary | ICD-10-CM

## 2021-09-28 DIAGNOSIS — E039 Hypothyroidism, unspecified: Secondary | ICD-10-CM | POA: Diagnosis not present

## 2021-09-28 DIAGNOSIS — R609 Edema, unspecified: Secondary | ICD-10-CM | POA: Diagnosis not present

## 2021-09-28 DIAGNOSIS — F419 Anxiety disorder, unspecified: Secondary | ICD-10-CM

## 2021-09-28 MED ORDER — POTASSIUM CHLORIDE CRYS ER 20 MEQ PO TBCR: 40.0000 meq | EXTENDED_RELEASE_TABLET | Freq: Every day | ORAL | 1 refills | Status: DC

## 2021-09-28 MED ORDER — FUROSEMIDE 40 MG PO TABS: ORAL_TABLET | ORAL | 1 refills | Status: DC

## 2021-09-28 MED ORDER — ALPRAZOLAM 0.5 MG PO TABS: 0.5000 mg | ORAL_TABLET | Freq: Two times a day (BID) | ORAL | 2 refills | Status: DC | PRN

## 2021-09-28 MED ORDER — LEVOTHYROXINE SODIUM 50 MCG PO TABS: ORAL_TABLET | ORAL | 1 refills | Status: DC

## 2021-09-28 MED ORDER — TELMISARTAN 80 MG PO TABS: 80.0000 mg | ORAL_TABLET | Freq: Every day | ORAL | 1 refills | Status: DC

## 2021-09-28 MED ORDER — OMEPRAZOLE-SODIUM BICARBONATE 40-1100 MG PO CAPS: 1.0000 | ORAL_CAPSULE | Freq: Every day | ORAL | Status: DC

## 2021-09-28 NOTE — Patient Instructions (Signed)
Peripheral Edema  Peripheral edema is swelling that is caused by a buildup of fluid. Peripheral edema most often affects the lower legs, ankles, and feet. It can also develop in the arms, hands, and face. The area of the body that has peripheral edema will look swollen. It may also feel heavy or warm. Your clothes may start to feel tight. Pressing on the area may make a temporary dent in your skin (pitting edema). You may not be able to move your swollen arm or leg as much as usual. There are many causes of peripheral edema. It can happen because of a complication of other conditions such as heart failure, kidney disease, or a problem with your circulation. It also can be a side effect of certain medicines or happen because of an infection. It often happens to women during pregnancy. Sometimes, the cause is not known. Follow these instructions at home: Managing pain, stiffness, and swelling  Raise (elevate) your legs while you are sitting or lying down. Move around often to prevent stiffness and to reduce swelling. Do not sit or stand for long periods of time. Do not wear tight clothing. Do not wear garters on your upper legs. Exercise your legs to get your circulation going. This helps to move the fluid back into your blood vessels, and it may help the swelling go down. Wear compression stockings as told by your health care provider. These stockings help to prevent blood clots and reduce swelling in your legs. It is important that these are the correct size. These stockings should be prescribed by your doctor to prevent possible injuries. If elastic bandages or wraps are recommended, use them as told by your health care provider. Medicines Take over-the-counter and prescription medicines only as told by your health care provider. Your health care provider may prescribe medicine to help your body get rid of excess water (diuretic). Take this medicine if you are told to take it. General  instructions Eat a low-salt (low-sodium) diet as told by your health care provider. Sometimes, eating less salt may reduce swelling. Pay attention to any changes in your symptoms. Moisturize your skin daily to help prevent skin from cracking and draining. Keep all follow-up visits. This is important. Contact a health care provider if: You have a fever. You have swelling in only one leg. You have increased swelling, redness, or pain in one or both of your legs. You have drainage or sores at the area where you have edema. Get help right away if: You have edema that starts suddenly or is getting worse, especially if you are pregnant or have a medical condition. You develop shortness of breath, especially when you are lying down. You have pain in your chest or abdomen. You feel weak. You feel like you will faint. These symptoms may be an emergency. Get help right away. Call 911. Do not wait to see if the symptoms will go away. Do not drive yourself to the hospital. Summary Peripheral edema is swelling that is caused by a buildup of fluid. Peripheral edema most often affects the lower legs, ankles, and feet. Move around often to prevent stiffness and to reduce swelling. Do not sit or stand for long periods of time. Pay attention to any changes in your symptoms. Contact a health care provider if you have edema that starts suddenly or is getting worse, especially if you are pregnant or have a medical condition. Get help right away if you develop shortness of breath, especially when lying down.   This information is not intended to replace advice given to you by your health care provider. Make sure you discuss any questions you have with your health care provider. Document Revised: 01/15/2021 Document Reviewed: 01/15/2021 Elsevier Patient Education  2023 Elsevier Inc.  

## 2021-09-28 NOTE — Progress Notes (Signed)
? ?  Subjective:  ? ? Patient ID: [name], female    DOB: 1945-06-04, 76 y.o.   MRN: [id number] ? ? ?Chief Complaint: cellulitis recheck ? ?HPI ? ?Patient comes in today for recheck of cellulitis. She was seen by Raelene Bott on 09/20/21 with swelling of bil lower ext. She was given doxycyline and unna boots were applied. She had follow up appointment on 09/24/21 with Raelene Bott, FNP. The doxycyline was causing nausea , so they changed her to clindamycin and new unna boots were applied. She comes in today for recheck. ? ? ?Review of Systems  ?Constitutional:  Negative for diaphoresis.  ?Eyes:  Negative for pain.  ?Respiratory:  Negative for shortness of breath.   ?Cardiovascular:  Positive for leg swelling. Negative for chest pain and palpitations.  ?Gastrointestinal:  Negative for abdominal pain.  ?Endocrine: Negative for polydipsia.  ?Skin:  Negative for rash.  ?Neurological:  Negative for dizziness, weakness and headaches.  ?Hematological:  Does not bruise/bleed easily.  ?All other systems reviewed and are negative. ? ?   ?Objective:  ? Physical Exam ?Vitals reviewed.  ?Constitutional:   ?   Appearance: Normal appearance. She is obese.  ?Cardiovascular:  ?   Rate and Rhythm: Normal rate and regular rhythm.  ?   Heart sounds: Normal heart sounds.  ?Pulmonary:  ?   Effort: Pulmonary effort is normal.  ?   Breath sounds: Normal breath sounds.  ?Musculoskeletal:  ?   Right lower leg: Edema (1+) present.  ?   Left lower leg: Edema (1+) present.  ?Neurological:  ?   General: No focal deficit present.  ?   Mental Status: She is alert and oriented to person, place, and time.  ?Psychiatric:     ?   Mood and Affect: Mood normal.     ?   Behavior: Behavior normal.  ? ? ?BP 139/77   Pulse (!) 51   Temp (!) 97.3 ?F (36.3 ?C) (Temporal)   Resp 20   Ht _0  (1.626 m)   Wt 164 lb (74.4 kg)   SpO2 100%   BMI 28.15 kg/m?  ? ? ? ?   ?Assessment & Plan:  ?[name] comes in today with chief complaint of No chief complaint on  file. ? ? ?Diagnosis and orders addressed: ? ?1. Anxiety ?Stress management ?- ALPRAZolam (XANAX) 0.5 MG tablet; Take 1 tablet (0.5 mg total) by mouth 2 (two) times daily as needed for anxiety (use SPARINGLY).  Dispense: 60 tablet; Refill: 2 ? ?2. Hypokalemia ?Labs pending ?- potassium chloride SA (KLOR-CON M) 20 MEQ tablet; Take 2 tablets (40 mEq total) by mouth daily. (NEEDS TO BE SEEN BEFORE NEXT REFILL)  Dispense: 90 tablet; Refill: 1 ?- BMP8+EGFR ? ?3. Hypothyroidism (acquired) ?Labs pending ?- levothyroxine (SYNTHROID) 50 MCG tablet; TAKE ONE (1) TABLET EACH DAY  Dispense: 90 tablet; Refill: 1 ? ?4. Peripheral edema ?No more unna boots needed ?No more antibiotics needed ?Elevate legs when sitting ?- furosemide (LASIX) 40 MG tablet; TAKE 1&1/2 TABLETS ONCE DAILY. MAY TAKE 2 TABLETS FOR SWELLING  Dispense: 90 tablet; Refill: 1 ? ?5. Essential hypertension ?Low sodium diet ?- telmisartan (MICARDIS) 80 MG tablet; Take 1 tablet (80 mg total) by mouth daily.  Dispense: 90 tablet; Refill: 1 ?- omeprazole-sodium bicarbonate (ZEGERID) 40-1100 MG capsule; Take 1 capsule by mouth daily before breakfast. ? ? ?Labs pending ?Health Maintenance reviewed ?Diet and exercise encouraged ? ?Follow up plan: ?1 month ? ? ?Mary-Margaret Hassell Done, FNP ? ? ?

## 2021-09-29 LAB — BMP8+EGFR
BUN/Creatinine Ratio: 15 (ref 12–28)
BUN: 21 mg/dL (ref 8–27)
CO2: 30 mmol/L — ABNORMAL HIGH (ref 20–29)
Calcium: 9.4 mg/dL (ref 8.7–10.3)
Chloride: 93 mmol/L — ABNORMAL LOW (ref 96–106)
Creatinine, Ser: 1.43 mg/dL — ABNORMAL HIGH (ref 0.57–1.00)
Glucose: 85 mg/dL (ref 70–99)
Potassium: 3.3 mmol/L — ABNORMAL LOW (ref 3.5–5.2)
Sodium: 141 mmol/L (ref 134–144)
eGFR: 38 mL/min/{1.73_m2} — ABNORMAL LOW (ref >59)

## 2021-10-02 ENCOUNTER — Encounter: Payer: Self-pay | Admitting: Emergency Medicine

## 2021-10-02 ENCOUNTER — Telehealth: Payer: Self-pay | Admitting: Nurse Practitioner

## 2021-10-02 DIAGNOSIS — N289 Disorder of kidney and ureter, unspecified: Secondary | ICD-10-CM

## 2021-10-02 DIAGNOSIS — Z79899 Other long term (current) drug therapy: Secondary | ICD-10-CM

## 2021-10-02 DIAGNOSIS — R609 Edema, unspecified: Secondary | ICD-10-CM

## 2021-10-02 NOTE — Telephone Encounter (Signed)
Labs also show some decrease in kidney function likely from lasix, can we clarify how she is currently taking it. If taking '60mg'$  daily would lower to '40mg'$  daily, repeat a bmet/mg in 2 weeks ? ? ?Zandra Abts MD ?

## 2021-10-02 NOTE — Telephone Encounter (Signed)
Notified patient of lab results. She states that she is taking 2 potassium supplements daily. Requested that labs be forwarded to Carlyle Dolly because he wanted her to have this potassium rechecked. Advised that I would send now and for her to contact their office for further instruction. Patient verbalized understanding ?

## 2021-10-03 MED ORDER — FUROSEMIDE 40 MG PO TABS: 40.0000 mg | ORAL_TABLET | Freq: Every day | ORAL | 1 refills | Status: DC

## 2021-10-03 NOTE — Telephone Encounter (Signed)
Patient informed and verbalized understanding of plan. ?Lab orders faxed to [facility] at [facility] ?

## 2021-10-03 NOTE — Telephone Encounter (Signed)
Pt returning call in regards to results.  ?

## 2021-10-10 ENCOUNTER — Telehealth: Payer: Self-pay | Admitting: Nurse Practitioner

## 2021-10-10 NOTE — Telephone Encounter (Signed)
Patient reports that she takes 2 Potassium per day. She is concerns about Creatine levels and Kidney Function.  ? ?Please advise.  ? ?[name]. LPN  ? ?

## 2021-10-11 NOTE — Telephone Encounter (Signed)
Please let patient know that we can recheck her kidney function in 3 months if she would like. There is nothing we can do about her current levels.

## 2021-10-19 NOTE — Telephone Encounter (Signed)
Informed patient of the most recent labs from her pcp.  Informed her that she needs to do labs for JB at the end of the month to recheck her kidney function & potassium.  She verbalized understanding.

## 2021-10-23 ENCOUNTER — Other Ambulatory Visit: Payer: Medicare Other

## 2021-10-23 ENCOUNTER — Telehealth: Payer: Self-pay | Admitting: Cardiology

## 2021-10-23 DIAGNOSIS — N289 Disorder of kidney and ureter, unspecified: Secondary | ICD-10-CM | POA: Diagnosis not present

## 2021-10-23 DIAGNOSIS — Z79899 Other long term (current) drug therapy: Secondary | ICD-10-CM | POA: Diagnosis not present

## 2021-10-23 DIAGNOSIS — R609 Edema, unspecified: Secondary | ICD-10-CM | POA: Diagnosis not present

## 2021-10-23 NOTE — Telephone Encounter (Signed)
Pt c/o BP issue: STAT if pt c/o blurred vision, one-sided weakness or slurred speech  1. What are your last 5 BP readings? 109/64 HR 53 today       130/71 HR 66 yesterday         84/60 HR  when she fell 3 nights ago  2. Are you having any other symptoms (ex. Dizziness, headache, blurred vision, passed out)? no  3. What is your BP issue? BP is low, feels weak.  She hasn't eaten anything.

## 2021-10-23 NOTE — Telephone Encounter (Signed)
Reports that she is feeling somewhat better than she did earlier today. Reports not staying well hydrated.  Denies dizziness, chest pain or SOB Medications reviewed. Confirmed that she takes lasix 80 mg daily. Potassium 40 meq daily without missing doses Says she is going to have lab work repeated today at her PCP office Advised BP is normal now Advised to stay well hydrated, continue to monitor symptoms. Advised message would be sent to provider Verbalized understanding

## 2021-10-24 ENCOUNTER — Telehealth: Payer: Self-pay

## 2021-10-24 DIAGNOSIS — Z79899 Other long term (current) drug therapy: Secondary | ICD-10-CM

## 2021-10-24 LAB — BASIC METABOLIC PANEL
BUN/Creatinine Ratio: 21 (ref 12–28)
BUN: 35 mg/dL — ABNORMAL HIGH (ref 8–27)
CO2: 14 mmol/L — ABNORMAL LOW (ref 20–29)
Calcium: 9.8 mg/dL (ref 8.7–10.3)
Chloride: 109 mmol/L — ABNORMAL HIGH (ref 96–106)
Creatinine, Ser: 1.69 mg/dL — ABNORMAL HIGH (ref 0.57–1.00)
Glucose: 101 mg/dL — ABNORMAL HIGH (ref 70–99)
Potassium: 5.9 mmol/L (ref 3.5–5.2)
Sodium: 136 mmol/L (ref 134–144)
eGFR: 31 mL/min/{1.73_m2} — ABNORMAL LOW (ref >59)

## 2021-10-24 LAB — MAGNESIUM: Magnesium: 2.6 mg/dL — ABNORMAL HIGH (ref 1.6–2.3)

## 2021-10-24 MED ORDER — FUROSEMIDE 20 MG PO TABS: 20.0000 mg | ORAL_TABLET | Freq: Every day | ORAL | 3 refills | Status: DC

## 2021-10-24 NOTE — Telephone Encounter (Signed)
Patient notified and verbalized understanding. Patient had no questions or concerns at this time. Pt agreeable with plan.

## 2021-10-24 NOTE — Telephone Encounter (Signed)
-----   Message from Arnoldo Lenis, MD sent at 10/24/2021 10:28 AM EDT ----- Kidney function with some further decline. Hold lasix for 2 days, then start back at lower dose '20mg'$  daily (clarify taking '40mg'$  daily currently). Potassium elevated, can stop her potassium. Repeat bmet 1week  Zandra Abts MD

## 2021-10-26 NOTE — Telephone Encounter (Signed)
Attempted to call pt w/ no answer, voicemail is full.

## 2021-10-26 NOTE — Telephone Encounter (Signed)
Patient was calling back with more questions

## 2021-10-29 ENCOUNTER — Telehealth: Payer: Self-pay | Admitting: Cardiology

## 2021-10-29 NOTE — Telephone Encounter (Signed)
Spoke to pt who wanted to clarify that she is no longer taking potassium supplement per Dr. Harl Bowie. Pt also verbalized awareness that she needs lab work drawn this week.

## 2021-10-29 NOTE — Telephone Encounter (Signed)
Patient calling with her bp readying   5/25: 84/54 Hr 52 5/29 101/71 hr 66 5/30 107/64 Hr 53          108/58 Hr 56          74/49 HR 54          107/62 HR 62          109/64 HR 53           128/66 HR 56 6/5     112/64 HR 66

## 2021-10-29 NOTE — Telephone Encounter (Signed)
Pt wanted to update Dr. Harl Bowie with her bp/hr readings.

## 2021-10-30 ENCOUNTER — Ambulatory Visit (INDEPENDENT_AMBULATORY_CARE_PROVIDER_SITE_OTHER): Payer: Medicare Other | Admitting: Nurse Practitioner

## 2021-10-30 ENCOUNTER — Encounter: Payer: Self-pay | Admitting: Nurse Practitioner

## 2021-10-30 ENCOUNTER — Ambulatory Visit: Payer: Medicare Other | Admitting: Nurse Practitioner

## 2021-10-30 VITALS — BP 103/62 | HR 53 | Temp 97.4°F | Ht 64.0 in | Wt 159.4 lb

## 2021-10-30 DIAGNOSIS — I10 Essential (primary) hypertension: Secondary | ICD-10-CM

## 2021-10-30 DIAGNOSIS — I700 Atherosclerosis of aorta: Secondary | ICD-10-CM

## 2021-10-30 DIAGNOSIS — Z6828 Body mass index (BMI) 28.0-28.9, adult: Secondary | ICD-10-CM | POA: Diagnosis not present

## 2021-10-30 DIAGNOSIS — F332 Major depressive disorder, recurrent severe without psychotic features: Secondary | ICD-10-CM

## 2021-10-30 DIAGNOSIS — E039 Hypothyroidism, unspecified: Secondary | ICD-10-CM | POA: Diagnosis not present

## 2021-10-30 DIAGNOSIS — E7800 Pure hypercholesterolemia, unspecified: Secondary | ICD-10-CM

## 2021-10-30 DIAGNOSIS — F419 Anxiety disorder, unspecified: Secondary | ICD-10-CM

## 2021-10-30 DIAGNOSIS — J449 Chronic obstructive pulmonary disease, unspecified: Secondary | ICD-10-CM | POA: Diagnosis not present

## 2021-10-30 DIAGNOSIS — D649 Anemia, unspecified: Secondary | ICD-10-CM

## 2021-10-30 DIAGNOSIS — I872 Venous insufficiency (chronic) (peripheral): Secondary | ICD-10-CM

## 2021-10-30 DIAGNOSIS — E559 Vitamin D deficiency, unspecified: Secondary | ICD-10-CM | POA: Diagnosis not present

## 2021-10-30 DIAGNOSIS — N289 Disorder of kidney and ureter, unspecified: Secondary | ICD-10-CM | POA: Diagnosis not present

## 2021-10-30 MED ORDER — LEVOTHYROXINE SODIUM 50 MCG PO TABS: ORAL_TABLET | ORAL | 1 refills | Status: DC

## 2021-10-30 MED ORDER — OMEPRAZOLE-SODIUM BICARBONATE 40-1100 MG PO CAPS: 1.0000 | ORAL_CAPSULE | Freq: Every day | ORAL | 1 refills | Status: DC

## 2021-10-30 MED ORDER — ALPRAZOLAM 0.5 MG PO TABS: 0.5000 mg | ORAL_TABLET | Freq: Two times a day (BID) | ORAL | 2 refills | Status: DC | PRN

## 2021-10-30 MED ORDER — ESCITALOPRAM OXALATE 10 MG PO TABS: 10.0000 mg | ORAL_TABLET | Freq: Every day | ORAL | 1 refills | Status: DC

## 2021-10-30 NOTE — Progress Notes (Signed)
Subjective:    Patient ID: [name], female    DOB: December 09, 1945, 76 y.o.   MRN: [id number]   Chief Complaint: medical management of chronic issues     HPI:  [name] is a 76 y.o. who identifies as a female who was assigned female at birth.   Social history: Lives with: lives with her daughter. Work history: retired   Scientist, forensic in today for follow up of the following chronic medical issues:  1. Essential hypertension No c/o chest pain, sob or headache. Does check blood pressure ar home. Running around 884-166 systolic. BP Readings from Last 3 Encounters:  09/28/21 139/77  09/24/21 139/68  09/20/21 (!) 153/79     2. Thoracic aorta atherosclerosis ([place]) Seenon chest xray- continue to monitor.  3. Venous (peripheral) insufficiency Has swelling of lower ext almost daily.   4. Chronic obstructive pulmonary disease, unspecified COPD type ([place]) Is currently on no inhalers. States that she is doing well.  5. Hypothyroidism (acquired) No problems that she os aware of. Lab Results  Component Value Date   TSH 2.960 03/01/2021     6. Renal insufficiency No voiding issues Lab Results  Component Value Date   CREATININE 1.69 (H) 10/23/2021     7. Pure hypercholesterolemia Does not watch diet and does no dedicated exercise. Lab Results  Component Value Date   CHOL 139 03/01/2021   HDL 49 03/01/2021   LDLCALC 72 03/01/2021   TRIG 97 03/01/2021   CHOLHDL 2.8 03/01/2021   The 10-year ASCVD risk score (Arnett DK, et al., 2019) is: 23.6%   8. Anxiety    09/28/2021   11:37 AM 03/01/2021   12:36 PM 02/25/2020    3:29 PM 02/25/2020    3:15 PM  GAD 7 : Generalized Anxiety Score  Nervous, Anxious, on Edge 0 0 1 3  Control/stop worrying 0 0 2 3  Worry too much - different things 0 '1 2 3  '$ Trouble relaxing 0 0 0 1  Restless 0 0 0 0  Easily annoyed or irritable 0 0 0 1  Afraid - awful might happen 0 0 0 1  Total GAD 7 Score 0 '1 5 12  '$ Anxiety Difficulty Not difficult  at all Not difficult at all Somewhat difficult Somewhat difficult     9. Severe episode of recurrent major depressive disorder, without psychotic features ([place]) Is on lexapro daily and seems to help her. No medication side effects  10. Anemia, unspecified type No c/o fatigue Lab Results  Component Value Date   HGB 9.6 (L) 09/10/2021     11. Vitamin D deficiency daily vitamin d supplement  12. BMI 28.0-28.9 Weight is down 5lbs  Wt Readings from Last 3 Encounters:  10/30/21 159 lb 6.4 oz (72.3 kg)  09/28/21 164 lb (74.4 kg)  09/24/21 168 lb 8 oz (76.4 kg)   BMI Readings from Last 3 Encounters:  10/30/21 27.36 kg/m  09/28/21 28.15 kg/m  09/24/21 28.92 kg/m      New complaints: Patient was on potassium supplements and her last potassium was elevated- she wa stold by cardiology to stop taking potassium supplement for now.  Allergies  Allergen Reactions   Celebrex [Celecoxib] Swelling   Keflet [Cephalexin] Swelling   Penicillins Hives and Swelling    DID THE REACTION INVOLVE: Swelling of the face/tongue/throat, SOB, or low BP? Yes-swelling-hives Sudden or severe rash/hives, skin peeling, or the inside of the mouth or nose? Unknown Did it require medical treatment? Unknown When did it  last happen?    over 10 years   If all above answers are "NO", may proceed with cephalosporin use.    Sulfa Antibiotics Swelling   Kenalog [Triamcinolone Acetonide]     unknown   Latex    Lisinopril Other (See Comments)    unknown   Mobic [Meloxicam]     SWELLING   Outpatient Encounter Medications as of 10/30/2021  Medication Sig   acetaminophen (TYLENOL) 650 MG CR tablet Take 650 mg by mouth every 8 (eight) hours as needed for pain.   ALPRAZolam (XANAX) 0.5 MG tablet Take 1 tablet (0.5 mg total) by mouth 2 (two) times daily as needed for anxiety (use SPARINGLY).   calcium carbonate (OS-CAL - DOSED IN MG OF ELEMENTAL CALCIUM) 1250 (500 Ca) MG tablet Take 1 tablet by mouth once a  week.    Cholecalciferol (VITAMIN D3) 10 MCG (400 UNIT) CAPS Take 1 capsule by mouth daily.   escitalopram (LEXAPRO) 10 MG tablet Take 1 tablet (10 mg total) by mouth daily.   furosemide (LASIX) 20 MG tablet Take 1 tablet (20 mg total) by mouth daily.   levothyroxine (SYNTHROID) 50 MCG tablet TAKE ONE (1) TABLET EACH DAY   Multiple Vitamin (MULTIVITAMIN) tablet Take 1 tablet by mouth 2 (two) times a week.    omeprazole-sodium bicarbonate (ZEGERID) 40-1100 MG capsule Take 1 capsule by mouth daily before breakfast.   telmisartan (MICARDIS) 80 MG tablet Take 1 tablet (80 mg total) by mouth daily.   No facility-administered encounter medications on file as of 10/30/2021.    Past Surgical History:  Procedure Laterality Date   ABDOMINAL HYSTERECTOMY     CHOLECYSTECTOMY N/A 11/04/2013   Procedure: LAPAROSCOPIC CHOLECYSTECTOMY WITH INTRAOPERATIVE CHOLANGIOGRAM;  Surgeon: Imogene Burn. Georgette Dover, MD;  Location: [facility];  Service: General;  Laterality: N/A;   COLONOSCOPY     FRACTURE SURGERY     pins removed from Hip surgery   HIP FRACTURE SURGERY  1990    pins removed in [place] ENDOSCOPY      Family History  Problem Relation Age of Onset   Arthritis Other    Heart disease Other    Cancer Other    Diabetes Other    OCD Other       Controlled substance contract: n/a     Review of Systems     Objective:   Physical Exam Vitals and nursing note reviewed.  Constitutional:      General: She is not in acute distress.    Appearance: Normal appearance. She is well-developed.  HENT:     Head: Normocephalic.     Right Ear: Tympanic membrane normal.     Left Ear: Tympanic membrane normal.     Nose: Nose normal.     Mouth/Throat:     Mouth: Mucous membranes are moist.  Eyes:     Pupils: Pupils are equal, round, and reactive to light.  Neck:     Vascular: No carotid bruit or JVD.  Cardiovascular:     Rate and Rhythm:  Normal rate and regular rhythm.     Heart sounds: Murmur (2/6) heard.  Pulmonary:     Effort: Pulmonary effort is normal. No respiratory distress.     Breath sounds: Normal breath sounds. No wheezing or rales.  Chest:     Chest wall: No tenderness.  Abdominal:     General: Bowel sounds are normal. There is no  distension or abdominal bruit.     Palpations: Abdomen is soft. There is no hepatomegaly, splenomegaly, mass or pulsatile mass.     Tenderness: There is no abdominal tenderness.  Musculoskeletal:        General: Normal range of motion.     Cervical back: Normal range of motion and neck supple.     Right lower leg: Edema (1+) present.     Left lower leg: Edema (1+) present.  Lymphadenopathy:     Cervical: No cervical adenopathy.  Skin:    General: Skin is warm and dry.  Neurological:     Mental Status: She is alert and oriented to person, place, and time.     Deep Tendon Reflexes: Reflexes are normal and symmetric.  Psychiatric:        Behavior: Behavior normal.        Thought Content: Thought content normal.        Judgment: Judgment normal.      BP 103/62   Pulse (!) 53   Temp (!) 97.4 F (36.3 C) (Oral)   Ht '5\' 4"'$  (1.626 m)   Wt 159 lb 6.4 oz (72.3 kg)   SpO2 98%   BMI 27.36 kg/m      Assessment & Plan:  [name] comes in today with chief complaint of Medical Management of Chronic Issues, Edema (rck), and Fatigue (Fell a weak ago)   Diagnosis and orders addressed:  1. Essential hypertension Low sodium diet Take 1/2 of micardis daily Keep diary of blod pressure at  home. - omeprazole-sodium bicarbonate (ZEGERID) 40-1100 MG capsule; Take 1 capsule by mouth daily before breakfast.  Dispense: 90 capsule; Refill: 1  2. Thoracic aorta atherosclerosis ([facility]) 3. Venous (peripheral) insufficiency Keep follow up with cardiology  4. Chronic obstructive pulmonary disease, unspecified COPD type ([place]) Let ius know if having breathing issues  5. Hypothyroidism  (acquired) Labs pending - levothyroxine (SYNTHROID) 50 MCG tablet; TAKE ONE (1) TABLET EACH DAY  Dispense: 90 tablet; Refill: 1  6. Renal insufficiency Labs pending  7. Pure hypercholesterolemia Low fat diet  8. Anxiety Stress management - ALPRAZolam (XANAX) 0.5 MG tablet; Take 1 tablet (0.5 mg total) by mouth 2 (two) times daily as needed for anxiety (use SPARINGLY).  Dispense: 60 tablet; Refill: 2  9. Severe episode of recurrent major depressive disorder, without psychotic features ([place]) - escitalopram (LEXAPRO) 10 MG tablet; Take 1 tablet (10 mg total) by mouth daily.  Dispense: 90 tablet; Refill: 1  10. Anemia, unspecified type Labs pending  11. Vitamin D deficiency Continue vitamin d supplement   12. BMI 28.0-28.9,adult Discussed diet and exercise for person with BMI >25 Will recheck weight in 3-6 months  13. Hyperkalemia Labs pending  Continue to hold potassium   Labs pending Health Maintenance reviewed Diet and exercise encouraged  Follow up plan: 6 months   [place], FNP

## 2021-10-30 NOTE — Patient Instructions (Signed)

## 2021-10-31 LAB — CBC WITH DIFFERENTIAL/PLATELET
Basophils Absolute: 0.1 10*3/uL (ref 0.0–0.2)
Basos: 1 %
EOS (ABSOLUTE): 0.2 10*3/uL (ref 0.0–0.4)
Eos: 3 %
Hematocrit: 33.2 % — ABNORMAL LOW (ref 34.0–46.6)
Hemoglobin: 10.8 g/dL — ABNORMAL LOW (ref 11.1–15.9)
Immature Grans (Abs): 0 10*3/uL (ref 0.0–0.1)
Immature Granulocytes: 0 %
Lymphocytes Absolute: 2.2 10*3/uL (ref 0.7–3.1)
Lymphs: 31 %
MCH: 25.7 pg — ABNORMAL LOW (ref 26.6–33.0)
MCHC: 32.5 g/dL (ref 31.5–35.7)
MCV: 79 fL (ref 79–97)
Monocytes Absolute: 0.5 10*3/uL (ref 0.1–0.9)
Monocytes: 7 %
Neutrophils Absolute: 4.2 10*3/uL (ref 1.4–7.0)
Neutrophils: 58 %
Platelets: 410 10*3/uL (ref 150–450)
RBC: 4.21 x10E6/uL (ref 3.77–5.28)
RDW: 16.2 % — ABNORMAL HIGH (ref 11.7–15.4)
WBC: 7.1 10*3/uL (ref 3.4–10.8)

## 2021-10-31 LAB — CMP14+EGFR
ALT: 9 IU/L (ref 0–32)
AST: 18 IU/L (ref 0–40)
Albumin/Globulin Ratio: 1.5 (ref 1.2–2.2)
Albumin: 4.1 g/dL (ref 3.7–4.7)
Alkaline Phosphatase: 160 IU/L — ABNORMAL HIGH (ref 44–121)
BUN/Creatinine Ratio: 24 (ref 12–28)
BUN: 39 mg/dL — ABNORMAL HIGH (ref 8–27)
Bilirubin Total: 0.4 mg/dL (ref 0.0–1.2)
CO2: 15 mmol/L — ABNORMAL LOW (ref 20–29)
Calcium: 9.4 mg/dL (ref 8.7–10.3)
Chloride: 108 mmol/L — ABNORMAL HIGH (ref 96–106)
Creatinine, Ser: 1.6 mg/dL — ABNORMAL HIGH (ref 0.57–1.00)
Globulin, Total: 2.7 g/dL (ref 1.5–4.5)
Glucose: 84 mg/dL (ref 70–99)
Potassium: 4.5 mmol/L (ref 3.5–5.2)
Sodium: 136 mmol/L (ref 134–144)
Total Protein: 6.8 g/dL (ref 6.0–8.5)
eGFR: 33 mL/min/{1.73_m2} — ABNORMAL LOW (ref >59)

## 2021-10-31 MED ORDER — FUROSEMIDE 20 MG PO TABS: 20.0000 mg | ORAL_TABLET | ORAL | 3 refills | Status: DC

## 2021-10-31 NOTE — Telephone Encounter (Signed)
Patient notified and verbalized understanding. Pt had no questions or concerns at this time 

## 2021-10-31 NOTE — Telephone Encounter (Signed)
BP's are low at times, labs suggest still dehydrated. Verify taking lasix '20mg'$  daily, if so change to '20mg'$  Mon, Wed, Fri  J Tessica Cupo MD

## 2021-11-12 ENCOUNTER — Telehealth: Payer: Self-pay | Admitting: Cardiology

## 2021-11-12 NOTE — Telephone Encounter (Signed)
Mailbox is full.

## 2021-11-12 NOTE — Telephone Encounter (Signed)
Bp's overall loook good, no changes  Zandra Abts MD

## 2021-11-12 NOTE — Telephone Encounter (Signed)
Pt c/o BP issue: STAT if pt c/o blurred vision, one-sided weakness or slurred speech  1. What are your last 5 BP readings?  6/07: 119/66 61 6/08: 128/68 50 6/09: 114/64 55 6/10: 98/58 52 (around 2:00 pm)          126/65 59 (8:00 pm) 6/11: 115/60 56 6/12: 129/71 58 6/13: 116/67 52 6/14: 116/61 52 6/15: 112/59 55  2. Are you having any other symptoms (ex. Dizziness, headache, blurred vision, passed out)?    3. What is your BP issue?   Patient is following up to provide BP readings for the past week. She states she stopped on 6/15. Has been feeling better today and yesterday--no more weakness.

## 2021-11-13 ENCOUNTER — Telehealth: Payer: Self-pay | Admitting: Nurse Practitioner

## 2021-11-13 NOTE — Telephone Encounter (Signed)
Patient notified and verbalized understanding. 

## 2021-11-13 NOTE — Telephone Encounter (Signed)
Discussed  with Patient that Blood Pressure medications is prescribed for 80 mg.  Patient verbalized understanding

## 2021-11-28 ENCOUNTER — Ambulatory Visit (INDEPENDENT_AMBULATORY_CARE_PROVIDER_SITE_OTHER): Payer: Medicare Other

## 2021-11-28 VITALS — Ht 64.0 in | Wt 159.0 lb

## 2021-11-28 DIAGNOSIS — Z0000 Encounter for general adult medical examination without abnormal findings: Secondary | ICD-10-CM

## 2021-11-28 DIAGNOSIS — R634 Abnormal weight loss: Secondary | ICD-10-CM | POA: Insufficient documentation

## 2021-11-28 DIAGNOSIS — Z800 Family history of malignant neoplasm of digestive organs: Secondary | ICD-10-CM | POA: Insufficient documentation

## 2021-11-28 DIAGNOSIS — K909 Intestinal malabsorption, unspecified: Secondary | ICD-10-CM | POA: Insufficient documentation

## 2021-11-28 NOTE — Patient Instructions (Signed)
[name] , Thank you for taking time to come for your Medicare Wellness Visit. I appreciate your ongoing commitment to your health goals. Please review the following plan we discussed and let me know if I can assist you in the future.   Screening recommendations/referrals: Colonoscopy: Done 2016. Repeat in 10 years  Mammogram: No longer required. Bone Density: Call office when ready to schedule.   Recommended yearly ophthalmology/optometry visit for glaucoma screening and checkup Recommended yearly dental visit for hygiene and checkup  Vaccinations: Influenza vaccine: Due Fall 2023. Pneumococcal vaccine: Prevnar 20 available at [facility]. Tdap vaccine: Due every 10 years. Shingles vaccine: Available at local pharmacy.   Covid-19: Available at local pharmacy.  Advanced directives: Please bring a copy of your health care power of attorney and living will to the office to be added to your chart at your convenience.   Conditions/risks identified: Aim for 6-8 glasses of water, and 5 servings of fruits and vegetables each day. Also try chair exercises to help maintain flexibility.   Next appointment: Follow up in one year for your annual wellness visit 2024.   Preventive Care 75 Years and Older, Female Preventive care refers to lifestyle choices and visits with your health care provider that can promote health and wellness. What does preventive care include? A yearly physical exam. This is also called an annual well check. Dental exams once or twice a year. Routine eye exams. Ask your health care provider how often you should have your eyes checked. Personal lifestyle choices, including: Daily care of your teeth and gums. Regular physical activity. Eating a healthy diet. Avoiding tobacco and drug use. Limiting alcohol use. Practicing safe sex. Taking low-dose aspirin every day. Taking vitamin and mineral supplements as recommended by your health care provider. What happens during an  annual well check? The services and screenings done by your health care provider during your annual well check will depend on your age, overall health, lifestyle risk factors, and family history of disease. Counseling  Your health care provider may ask you questions about your: Alcohol use. Tobacco use. Drug use. Emotional well-being. Home and relationship well-being. Sexual activity. Eating habits. History of falls. Memory and ability to understand (cognition). Work and work Statistician. Reproductive health. Screening  You may have the following tests or measurements: Height, weight, and BMI. Blood pressure. Lipid and cholesterol levels. These may be checked every 5 years, or more frequently if you are over 75 years old. Skin check. Lung cancer screening. You may have this screening every year starting at age 75 if you have a 30-pack-year history of smoking and currently smoke or have quit within the past 15 years. Fecal occult blood test (FOBT) of the stool. You may have this test every year starting at age 75. Flexible sigmoidoscopy or colonoscopy. You may have a sigmoidoscopy every 5 years or a colonoscopy every 10 years starting at age 30. Hepatitis C blood test. Hepatitis B blood test. Sexually transmitted disease (STD) testing. Diabetes screening. This is done by checking your blood sugar (glucose) after you have not eaten for a while (fasting). You may have this done every 1-3 years. Bone density scan. This is done to screen for osteoporosis. You may have this done starting at age 75. Mammogram. This may be done every 1-2 years. Talk to your health care provider about how often you should have regular mammograms. Talk with your health care provider about your test results, treatment options, and if necessary, the need for more tests.  Vaccines  Your health care provider may recommend certain vaccines, such as: Influenza vaccine. This is recommended every year. Tetanus,  diphtheria, and acellular pertussis (Tdap, Td) vaccine. You may need a Td booster every 10 years. Zoster vaccine. You may need this after age 57. Pneumococcal 13-valent conjugate (PCV13) vaccine. One dose is recommended after age 36. Pneumococcal polysaccharide (PPSV23) vaccine. One dose is recommended after age 59. Talk to your health care provider about which screenings and vaccines you need and how often you need them. This information is not intended to replace advice given to you by your health care provider. Make sure you discuss any questions you have with your health care provider. Document Released: 06/09/2015 Document Revised: 01/31/2016 Document Reviewed: 03/14/2015 Elsevier Interactive Patient Education  2017 [place] Prevention in the Home Falls can cause injuries. They can happen to people of all ages. There are many things you can do to make your home safe and to help prevent falls. What can I do on the outside of my home? Regularly fix the edges of walkways and driveways and fix any cracks. Remove anything that might make you trip as you walk through a door, such as a raised step or threshold. Trim any bushes or trees on the path to your home. Use bright outdoor lighting. Clear any walking paths of anything that might make someone trip, such as rocks or tools. Regularly check to see if handrails are loose or broken. Make sure that both sides of any steps have handrails. Any raised decks and porches should have guardrails on the edges. Have any leaves, snow, or ice cleared regularly. Use sand or salt on walking paths during winter. Clean up any spills in your garage right away. This includes oil or grease spills. What can I do in the bathroom? Use night lights. Install grab bars by the toilet and in the tub and shower. Do not use towel bars as grab bars. Use non-skid mats or decals in the tub or shower. If you need to sit down in the shower, use a plastic,  non-slip stool. Keep the floor dry. Clean up any water that spills on the floor as soon as it happens. Remove soap buildup in the tub or shower regularly. Attach bath mats securely with double-sided non-slip rug tape. Do not have throw rugs and other things on the floor that can make you trip. What can I do in the bedroom? Use night lights. Make sure that you have a light by your bed that is easy to reach. Do not use any sheets or blankets that are too big for your bed. They should not hang down onto the floor. Have a firm chair that has side arms. You can use this for support while you get dressed. Do not have throw rugs and other things on the floor that can make you trip. What can I do in the kitchen? Clean up any spills right away. Avoid walking on wet floors. Keep items that you use a lot in easy-to-reach places. If you need to reach something above you, use a strong step stool that has a grab bar. Keep electrical cords out of the way. Do not use floor polish or wax that makes floors slippery. If you must use wax, use non-skid floor wax. Do not have throw rugs and other things on the floor that can make you trip. What can I do with my stairs? Do not leave any items on the stairs. Make sure that  there are handrails on both sides of the stairs and use them. Fix handrails that are broken or loose. Make sure that handrails are as long as the stairways. Check any carpeting to make sure that it is firmly attached to the stairs. Fix any carpet that is loose or worn. Avoid having throw rugs at the top or bottom of the stairs. If you do have throw rugs, attach them to the floor with carpet tape. Make sure that you have a light switch at the top of the stairs and the bottom of the stairs. If you do not have them, ask someone to add them for you. What else can I do to help prevent falls? Wear shoes that: Do not have high heels. Have rubber bottoms. Are comfortable and fit you well. Are closed  at the toe. Do not wear sandals. If you use a stepladder: Make sure that it is fully opened. Do not climb a closed stepladder. Make sure that both sides of the stepladder are locked into place. Ask someone to hold it for you, if possible. Clearly mark and make sure that you can see: Any grab bars or handrails. First and last steps. Where the edge of each step is. Use tools that help you move around (mobility aids) if they are needed. These include: Canes. Walkers. Scooters. Crutches. Turn on the lights when you go into a dark area. Replace any light bulbs as soon as they burn out. Set up your furniture so you have a clear path. Avoid moving your furniture around. If any of your floors are uneven, fix them. If there are any pets around you, be aware of where they are. Review your medicines with your doctor. Some medicines can make you feel dizzy. This can increase your chance of falling. Ask your doctor what other things that you can do to help prevent falls. This information is not intended to replace advice given to you by your health care provider. Make sure you discuss any questions you have with your health care provider. Document Released: 03/09/2009 Document Revised: 10/19/2015 Document Reviewed: 06/17/2014 Elsevier Interactive Patient Education  2017 [employer].

## 2021-11-28 NOTE — Progress Notes (Signed)
Subjective:   [name] is a 76 y.o. female who presents for an Initial Medicare Annual Wellness Visit. Virtual Visit via Telephone Note  I connected with  [name] on 11/28/21 at  3:30 PM EDT by telephone and verified that I am speaking with the correct person using two identifiers.  Location: Patient: HOME Provider: [facility] Persons participating in the virtual visit: patient/Nurse Health Advisor   I discussed the limitations, risks, security and privacy concerns of performing an evaluation and management service by telephone and the availability of in person appointments. The patient expressed understanding and agreed to proceed.  Interactive audio and video telecommunications were attempted between this nurse and patient, however failed, due to patient having technical difficulties OR patient did not have access to video capability.  We continued and completed visit with audio only.  Some vital signs may be absent or patient reported.   Chriss Driver, LPN  Review of Systems     Cardiac Risk Factors include: advanced age (>23mn, >>77women);hypertension;dyslipidemia;sedentary lifestyle     Objective:    Today's Vitals   11/28/21 1531 11/28/21 1532  Weight: 159 lb (72.1 kg)   Height: '5\' 4"'$  (1.626 m)   PainSc:  5    Body mass index is 27.29 kg/m.     11/28/2021    3:40 PM 02/13/2021    3:22 PM 05/19/2018    1:45 PM 05/19/2018    1:44 PM 10/27/2013   11:26 AM  Advanced Directives  Does Patient Have a Medical Advance Directive? No No No;Yes No Patient does not have advance directive;Patient would not like information  Type of ANurse, adultPower of APacific BeachLiving will    Would patient like information on creating a medical advance directive? No - Patient declined      Pre-existing out of facility DNR order (yellow form or pink MOST form)     No    Current Medications (verified) Outpatient Encounter Medications as of 11/28/2021  Medication Sig    acetaminophen (TYLENOL) 650 MG CR tablet Take 650 mg by mouth every 8 (eight) hours as needed for pain.   ALPRAZolam (XANAX) 0.5 MG tablet Take 1 tablet (0.5 mg total) by mouth 2 (two) times daily as needed for anxiety (use SPARINGLY).   Cholecalciferol (VITAMIN D3) 10 MCG (400 UNIT) CAPS Take 1 capsule by mouth daily.   escitalopram (LEXAPRO) 10 MG tablet Take 1 tablet (10 mg total) by mouth daily.   furosemide (LASIX) 20 MG tablet Take 1 tablet (20 mg total) by mouth every Monday, Wednesday, and Friday.   levothyroxine (SYNTHROID) 50 MCG tablet TAKE ONE (1) TABLET EACH DAY   Multiple Vitamin (MULTIVITAMIN) tablet Take 1 tablet by mouth 2 (two) times a week.    omeprazole-sodium bicarbonate (ZEGERID) 40-1100 MG capsule Take 1 capsule by mouth daily before breakfast.   oxyCODONE-acetaminophen (PERCOCET) 5-325 MG tablet Take 1 tablet every 6 hours by oral route as needed for 5 days.   telmisartan (MICARDIS) 80 MG tablet Take 1 tablet (80 mg total) by mouth daily.   calcium carbonate (OS-CAL - DOSED IN MG OF ELEMENTAL CALCIUM) 1250 (500 Ca) MG tablet Take 1 tablet by mouth once a week.    predniSONE (DELTASONE) 5 MG tablet TAKE 6 TABLETS TODAY THEN DECREASE BY 1 TABLET DAILY (6-5-4-3-2-1) (Patient not taking: Reported on 11/28/2021)   No facility-administered encounter medications on file as of 11/28/2021.    Allergies (verified) Celebrex [celecoxib], Keflet [cephalexin], Penicillins, Sulfa antibiotics,  Kenalog [triamcinolone acetonide], Latex, Lisinopril, and Mobic [meloxicam]   History: Past Medical History:  Diagnosis Date   Allergy    Bronchitis, chronic (HCC)    Chronic anxiety    Complication of anesthesia    hard to wake up   COPD (chronic obstructive pulmonary disease) (HCC)    Depression    Esophageal reflux    Headache(784.0)    Hyperlipidemia    Hypertension    Hypothyroidism    Obesity    Osteoporosis    Overactive bladder    PVC's (premature ventricular contractions)     Thyroid disease    Vitamin D deficiency disease    Past Surgical History:  Procedure Laterality Date   ABDOMINAL HYSTERECTOMY     CHOLECYSTECTOMY N/A 11/04/2013   Procedure: LAPAROSCOPIC CHOLECYSTECTOMY WITH INTRAOPERATIVE CHOLANGIOGRAM;  Surgeon: Imogene Burn. Georgette Dover, MD;  Location: [facility];  Service: General;  Laterality: N/A;   COLONOSCOPY     FRACTURE SURGERY     pins removed from Hip surgery   HIP FRACTURE SURGERY  1990    pins removed in [place] END[facility]COPY     Family History  Problem Relation Age of Onset   Arthritis Other    Heart disease Other    Cancer Other    Diabetes Other    OCD Other    Social History   Socioeconomic History   Marital status: Single    Spouse name: Not on file   Number of children: 2   Years of education: Not on file   Highest education level: Not on file  Occupational History   Not on file  Tobacco Use   Smoking status: Never   Smokeless tobacco: Never  Vaping Use   Vaping Use: Never used  Substance and Sexual Activity   Alcohol use: No   Drug use: No   Sexual activity: Not on file  Other Topics Concern   Not on file  Social History Narrative   Not on file   Social Determinants of Health   Financial Resource Strain: Low Risk  (11/28/2021)   Overall Financial Resource Strain (CARDIA)    Difficulty of Paying Living Expenses: Not hard at all  Food Insecurity: No Food Insecurity (11/28/2021)   Hunger Vital Sign    Worried About Running Out of Food in the Last Year: Never true    [place] in the Last Year: Never true  Transportation Needs: No Transportation Needs (11/28/2021)   PRAPARE - Hydrologist (Medical): No    Lack of Transportation (Non-Medical): No  Physical Activity: Inactive (11/28/2021)   Exercise Vital Sign    Days of Exercise per Week: 0 days    Minutes of Exercise per Session: 0 min  Stress: Stress Concern Present  (11/28/2021)   [place]    Feeling of Stress : To some extent  Social Connections: Socially Integrated (11/28/2021)   Social Connection and Isolation Panel [NHANES]    Frequency of Communication with Friends and Family: More than three times a week    Frequency of Social Gatherings with Friends and Family: More than three times a week    Attends Religious Services: More than 4 times per year    Active Member of [employer] or Organizations: Yes    Attends Archivist Meetings: More than 4 times per year  Marital Status: Married    Tobacco Counseling Counseling given: Not Answered   Clinical Intake:  Pre-visit preparation completed: Yes  Pain : 0-10 Pain Score: 5  Pain Type: Chronic pain Pain Location: Back Pain Descriptors / Indicators: Aching, Discomfort, Sharp Pain Onset: More than a month ago Pain Frequency: Intermittent     BMI - recorded: 27.29 Nutritional Status: BMI 25 -29 Overweight Nutritional Risks: None Diabetes: No  How often do you need to have someone help you when you read instructions, pamphlets, or other written materials from your doctor or pharmacy?: 1 - Never  Diabetic?NO   Interpreter Needed?: No  Information entered by :: mj Suhani Stillion, lpn   Activities of Daily Living    11/28/2021    3:42 PM 03/01/2021   12:37 PM  In your present state of health, do you have any difficulty performing the following activities:  Hearing? 0 0  Vision? 0 0  Difficulty concentrating or making decisions? 0 0  Walking or climbing stairs? 1 0  Dressing or bathing? 0 0  Doing errands, shopping? 0 0  Preparing Food and eating ? N   Using the Toilet? N   In the past six months, have you accidently leaked urine? Y   Do you have problems with loss of bowel control? Y   Managing your Medications? N   Managing your Finances? N   Housekeeping or managing your Housekeeping? N     Patient Care  Team: Chevis Pretty, FNP as PCP - General (Family Medicine) Harl Bowie Alphonse Guild, MD as PCP - Cardiology (Cardiology)  Indicate any recent Medical Services you may have received from other than [facility] providers in the past year (date may be approximate).     Assessment:   This is a routine wellness examination for Neha.  Hearing/Vision screen Hearing Screening - Comments:: Some hearing issues.  Vision Screening - Comments:: Glasses. [employer] [place] 2022.  Dietary issues and exercise activities discussed: Current Exercise Habits: The patient does not participate in regular exercise at present, Exercise limited by: cardiac condition(s);orthopedic condition(s);respiratory conditions(s)   Goals Addressed             This Visit's Progress    Exercise 3x per week (30 min per time)       Encouraged chair exercises.        Depression Screen    11/28/2021    3:35 PM 09/28/2021   11:37 AM 03/01/2021   12:36 PM 02/25/2020    3:15 PM 10/04/2019    9:44 AM 09/30/2019   11:35 AM 08/31/2019   11:48 AM  PHQ 2/9 Scores  PHQ - 2 Score 2 0 2 0 0 0 6  PHQ- 9 Score 3 0 2    10    Fall Risk    11/28/2021    3:40 PM 09/28/2021   11:37 AM 03/01/2021   12:36 PM 02/25/2020    3:15 PM 10/04/2019    9:44 AM  Fall Risk   Falls in the past year? 1 0 0 0 0  Number falls in past yr: 0   0   Injury with Fall? 0   0   Risk for fall due to : History of fall(s);Impaired balance/gait   No Fall Risks   Follow up Falls prevention discussed   Falls evaluation completed     FALL RISK PREVENTION PERTAINING TO THE HOME:  Any stairs in or around the home? No  If so, are there any without handrails? No  Home  free of loose throw rugs in walkways, pet beds, electrical cords, etc? Yes  Adequate lighting in your home to reduce risk of falls? Yes   ASSISTIVE DEVICES UTILIZED TO PREVENT FALLS:  Life alert? No  Use of a cane, walker or w/c? Yes  Grab bars in the bathroom? Yes  Shower chair or bench in  shower? Yes  Elevated toilet seat or a handicapped toilet? Yes   TIMED UP AND GO:  Was the test performed? No .  Phone visit.   Cognitive Function:        11/28/2021    3:43 PM  6CIT Screen  What Year? 0 points  What month? 0 points  What time? 0 points  Count back from 20 0 points  Months in reverse 0 points  Repeat phrase 0 points  Total Score 0 points    Immunizations  There is no immunization history on file for this patient.  TDAP status: Due, Education has been provided regarding the importance of this vaccine. Advised may receive this vaccine at local pharmacy or Health Dept. Aware to provide a copy of the vaccination record if obtained from local pharmacy or Health Dept. Verbalized acceptance and understanding.  Flu Vaccine status: Due, Education has been provided regarding the importance of this vaccine. Advised may receive this vaccine at local pharmacy or Health Dept. Aware to provide a copy of the vaccination record if obtained from local pharmacy or Health Dept. Verbalized acceptance and understanding.  Pneumococcal vaccine status: Due, Education has been provided regarding the importance of this vaccine. Advised may receive this vaccine at local pharmacy or Health Dept. Aware to provide a copy of the vaccination record if obtained from local pharmacy or Health Dept. Verbalized acceptance and understanding.  Covid-19 vaccine status: Declined, Education has been provided regarding the importance of this vaccine but patient still declined. Advised may receive this vaccine at local pharmacy or Health Dept.or vaccine clinic. Aware to provide a copy of the vaccination record if obtained from local pharmacy or Health Dept. Verbalized acceptance and understanding.  Qualifies for Shingles Vaccine? Yes   Zostavax completed No   Shingrix Completed?: No.    Education has been provided regarding the importance of this vaccine. Patient has been advised to call insurance company to  determine out of pocket expense if they have not yet received this vaccine. Advised may also receive vaccine at local pharmacy or Health Dept. Verbalized acceptance and understanding.  Screening Tests Health Maintenance  Topic Date Due   DEXA SCAN  03/01/2022 (Originally 09/03/2011)   COLONOSCOPY (Pts 45-76yr Insurance coverage will need to be confirmed)  03/01/2022 (Originally 09/03/1991)   TETANUS/TDAP  03/01/2022 (Originally 09/02/1965)   Hepatitis C Screening  03/01/2022 (Originally 09[birth year]02/1964)   COVID-19 Vaccine (1) 05/24/2022 (Originally 03/05/1946)   Zoster Vaccines- Shingrix (1 of 2) 05/24/2022 (Originally 09/02/1964)   Pneumonia Vaccine 75 Years old (1 - PCV) 05/23/2023 (Originally 09/03/2011)   INFLUENZA VACCINE  12/25/2021   HPV VACCINES  Aged Out    Health Maintenance  There are no preventive care reminders to display for this patient.   Colorectal cancer screening: Type of screening: Colonoscopy. Completed 2016 per pt. Repeat every 10 per pt.  years  Mammogram status: No longer required due to age.  Bone Density status: Ordered Pt states she will call office to schedule. Pt provided with contact info and advised to call to schedule appt.  Lung Cancer Screening: (Low Dose CT Chest recommended if Age 75-80years, 30 pack-year  currently smoking OR have quit w/in 15years.) does not qualify.   Additional Screening:  Hepatitis C Screening: does qualify; Completed due  Vision Screening: Recommended annual ophthalmology exams for early detection of glaucoma and other disorders of the eye. Is the patient up to date with their annual eye exam?  Yes  Who is the provider or what is the name of the office in which the patient attends annual eye exams? [facility] If pt is not established with a provider, would they like to be referred to a provider to establish care? No .   Dental Screening: Recommended annual dental exams for proper oral hygiene  Community Resource Referral / Chronic  Care Management: CRR required this visit?  No   CCM required this visit?  No      Plan:     I have personally reviewed and noted the following in the patient's chart:   Medical and social history Use of alcohol, tobacco or illicit drugs  Current medications and supplements including opioid prescriptions. Patient is currently taking opioid prescriptions. Information provided to patient regarding non-opioid alternatives. Patient advised to discuss non-opioid treatment plan with their provider. Functional ability and status Nutritional status Physical activity Advanced directives List of other physicians Hospitalizations, surgeries, and ER visits in previous 12 months Vitals Screenings to include cognitive, depression, and falls Referrals and appointments  In addition, I have reviewed and discussed with patient certain preventive protocols, quality metrics, and best practice recommendations. A written personalized care plan for preventive services as well as general preventive health recommendations were provided to patient.     Chriss Driver, LPN   05/27/9145   Nurse Notes: Discussed DEXA. Pt states she will call to schedule. Declined mammogram. Pt states her last Colonoscopy was in 2016 and f/u is every 10 years. Declined vaccines.

## 2021-12-05 ENCOUNTER — Ambulatory Visit (INDEPENDENT_AMBULATORY_CARE_PROVIDER_SITE_OTHER): Payer: Medicare Other | Admitting: Cardiology

## 2021-12-05 ENCOUNTER — Encounter: Payer: Self-pay | Admitting: Cardiology

## 2021-12-05 VITALS — BP 136/84 | HR 52 | Ht 64.0 in | Wt 173.6 lb

## 2021-12-05 DIAGNOSIS — I5032 Chronic diastolic (congestive) heart failure: Secondary | ICD-10-CM | POA: Diagnosis not present

## 2021-12-05 MED ORDER — FUROSEMIDE 20 MG PO TABS: 20.0000 mg | ORAL_TABLET | ORAL | 1 refills | Status: DC

## 2021-12-05 NOTE — Patient Instructions (Signed)
Medication Instructions:  Your physician recommends that you continue on your current medications as directed. Please refer to the Current Medication list given to you today.   Labwork: BMET MAG TSH  Testing/Procedures: None  Follow-Up:  Your physician recommends that you schedule a follow-up appointment in: 4 month Follow Up  Any Other Special Instructions Will Be Listed Below (If Applicable).  If you need a refill on your cardiac medications before your next appointment, please call your pharmacy.

## 2021-12-05 NOTE — Progress Notes (Signed)
Clinical Summary [name] is a 76 y.o.female seen today for follow up of the following medical problems.    1. Leg edema/Chronic diastolic HF   -11/7822 LE venous US [place]: no DVT     09/2017 echo LVEF 60-65%, no WMAs, Diastolic function is abnormal, indeterminant grade   - was seen in lymphedema clinic, started on home pump - taking lasix '40mg'$  daily - she is down roughly 50 lbs since 2019 due to dietary changes    -low bp's recently and declinring renal function, we have scaled back her diuretic to lasix 20 mg M,W,F.  - 6/6 Cr 1.6, had been 0.9 in April - dizzienss is imprving. Edema has increased, weight up from 159 lbs to 173 lbs   2.HTN - compliant with meds   Past Medical History:  Diagnosis Date   Allergy    Bronchitis, chronic (HCC)    Chronic anxiety    Complication of anesthesia    hard to wake up   COPD (chronic obstructive pulmonary disease) (HCC)    Depression    Esophageal reflux    Headache(784.0)    Hyperlipidemia    Hypertension    Hypothyroidism    Obesity    Osteoporosis    Overactive bladder    PVC's (premature ventricular contractions)    Thyroid disease    Vitamin D deficiency disease      Allergies  Allergen Reactions   Celebrex [Celecoxib] Swelling   Keflet [Cephalexin] Swelling   Penicillins Hives and Swelling    DID THE REACTION INVOLVE: Swelling of the face/tongue/throat, SOB, or low BP? Yes-swelling-hives Sudden or severe rash/hives, skin peeling, or the inside of the mouth or nose? Unknown Did it require medical treatment? Unknown When did it last happen?    over 10 years   If all above answers are "NO", may proceed with cephalosporin use.    Sulfa Antibiotics Swelling   Kenalog [Triamcinolone Acetonide]     unknown   Latex    Lisinopril Other (See Comments)    unknown   Mobic [Meloxicam]     SWELLING     Current Outpatient Medications  Medication Sig Dispense Refill   acetaminophen (TYLENOL) 650 MG CR tablet  Take 650 mg by mouth every 8 (eight) hours as needed for pain.     ALPRAZolam (XANAX) 0.5 MG tablet Take 1 tablet (0.5 mg total) by mouth 2 (two) times daily as needed for anxiety (use SPARINGLY). 60 tablet 2   calcium carbonate (OS-CAL - DOSED IN MG OF ELEMENTAL CALCIUM) 1250 (500 Ca) MG tablet Take 1 tablet by mouth once a week.      Cholecalciferol (VITAMIN D3) 10 MCG (400 UNIT) CAPS Take 1 capsule by mouth daily.     escitalopram (LEXAPRO) 10 MG tablet Take 1 tablet (10 mg total) by mouth daily. 90 tablet 1   furosemide (LASIX) 20 MG tablet Take 1 tablet (20 mg total) by mouth every Monday, Wednesday, and Friday. 30 tablet 3   levothyroxine (SYNTHROID) 50 MCG tablet TAKE ONE (1) TABLET EACH DAY 90 tablet 1   Multiple Vitamin (MULTIVITAMIN) tablet Take 1 tablet by mouth 2 (two) times a week.      omeprazole-sodium bicarbonate (ZEGERID) 40-1100 MG capsule Take 1 capsule by mouth daily before breakfast. 90 capsule 1   oxyCODONE-acetaminophen (PERCOCET) 5-325 MG tablet Take 1 tablet every 6 hours by oral route as needed for 5 days.     predniSONE (DELTASONE) 5 MG tablet TAKE 6  TABLETS TODAY THEN DECREASE BY 1 TABLET DAILY (6-5-4-3-2-1) (Patient not taking: Reported on 11/28/2021)     telmisartan (MICARDIS) 80 MG tablet Take 1 tablet (80 mg total) by mouth daily. 90 tablet 1   No current facility-administered medications for this visit.     Past Surgical History:  Procedure Laterality Date   ABDOMINAL HYSTERECTOMY     CHOLECYSTECTOMY N/A 11/04/2013   Procedure: LAPAROSCOPIC CHOLECYSTECTOMY WITH INTRAOPERATIVE CHOLANGIOGRAM;  Surgeon: Imogene Burn. Tsuei, MD;  Location: [facility];  Service: General;  Laterality: N/A;   COLONOSCOPY     FRACTURE SURGERY     pins removed from Hip surgery   HIP FRACTURE SURGERY  1990    pins removed in [place] [facility]       Allergies  Allergen Reactions   Celebrex [Celecoxib] Swelling   Keflet  [Cephalexin] Swelling   Penicillins Hives and Swelling    DID THE REACTION INVOLVE: Swelling of the face/tongue/throat, SOB, or low BP? Yes-swelling-hives Sudden or severe rash/hives, skin peeling, or the inside of the mouth or nose? Unknown Did it require medical treatment? Unknown When did it last happen?    over 10 years   If all above answers are "NO", may proceed with cephalosporin use.    Sulfa Antibiotics Swelling   Kenalog [Triamcinolone Acetonide]     unknown   Latex    Lisinopril Other (See Comments)    unknown   Mobic [Meloxicam]     SWELLING      Family History  Problem Relation Age of Onset   Arthritis Other    Heart disease Other    Cancer Other    Diabetes Other    OCD Other      Social History [name] reports that she has never smoked. She has never used smokeless tobacco. [name] reports no history of alcohol use.   Review of Systems CONSTITUTIONAL: No weight loss, fever, chills, weakness or fatigue.  HEENT: Eyes: No visual loss, blurred vision, double vision or yellow sclerae.No hearing loss, sneezing, congestion, runny nose or sore throat.  SKIN: No rash or itching.  CARDIOVASCULAR: per hpi RESPIRATORY: No shortness of breath, cough or sputum.  GASTROINTESTINAL: No anorexia, nausea, vomiting or diarrhea. No abdominal pain or blood.  GENITOURINARY: No burning on urination, no polyuria NEUROLOGICAL: No headache, dizziness, syncope, paralysis, ataxia, numbness or tingling in the extremities. No change in bowel or bladder control.  MUSCULOSKELETAL: No muscle, back pain, joint pain or stiffness.  LYMPHATICS: No enlarged nodes. No history of splenectomy.  PSYCHIATRIC: No history of depression or anxiety.  ENDOCRINOLOGIC: No reports of sweating, cold or heat intolerance. No polyuria or polydipsia.  Marland Kitchen   Physical Examination Today's Vitals   12/05/21 1408  BP: 136/84  Pulse: (!) 52  SpO2: 98%  Weight: 173 lb 9.6 oz (78.7 kg)  Height: '5\' 4"'$   (1.626 m)   Body mass index is 29.8 kg/m.  Gen: resting comfortably, no acute distress HEENT: no scleral icterus, pupils equal round and reactive, no palptable cervical adenopathy,  CV: RRR, no m/r/g no jvd Resp: Clear to auscultation bilaterally GI: abdomen is soft, non-tender, non-distended, normal bowel sounds, no hepatosplenomegaly MSK: extremities are warm, 2+ bilatearl non pitting edema.  Skin: warm, no rash Neuro:  no focal deficits Psych: appropriate affect   Diagnostic Studies  09/2017 echo Study Conclusions   - Left ventricle: The cavity size was normal. Wall  thickness was   increased in a pattern of mild LVH. Systolic function was normal.   The estimated ejection fraction was in the range of 60% to 65%.   Wall motion was normal; there were no regional wall motion   abnormalities. - Aortic valve: Mildly calcified annulus. Trileaflet; mildly   thickened leaflets. Mean gradient (S): 8 mm Hg. Valve area (VTI):   2.13 cm^2. Valve area (Vmax): 1.86 cm^2. Valve area (Vmean): 2.05   cm^2. - Left atrium: The atrium was severely dilated. - Right atrium: The atrium was mildly dilated. - Technically adequate study.   Assessment and Plan   1. Leg edema/Chronic diastolic HF - supsect combined diastolic HF and lymphedema - careful diuretic dosing given prior AKI with higher doses. Swelling will only respond so much to diuretic as she also has significant lymphedema - recent issues with AKI and low bp's resolved with lower dose of diuretic, though edema increased and up 14 lbs. Change lasix to '20mg'$  MWF may take additional '20mg'$  prn. Recheck bmet/mg   2. HTN - at goal, continue to monitor         Arnoldo Lenis, M.D.

## 2021-12-11 ENCOUNTER — Telehealth: Payer: Self-pay | Admitting: Nurse Practitioner

## 2021-12-11 NOTE — Telephone Encounter (Signed)
You cannot just write a prescription for a scooter and insurance pay for it. You have to have an appointment just for scooter and we have certain questions we ned to answer. You usually have to call scooter company and they will fax paper work to [place] and once we have the paper work we can make an appointment with her to get it done.

## 2021-12-12 NOTE — Telephone Encounter (Signed)
Pt aware.

## 2022-01-04 ENCOUNTER — Ambulatory Visit (INDEPENDENT_AMBULATORY_CARE_PROVIDER_SITE_OTHER): Payer: Medicare Other | Admitting: Family Medicine

## 2022-01-04 ENCOUNTER — Other Ambulatory Visit: Payer: Self-pay

## 2022-01-04 ENCOUNTER — Emergency Department (HOSPITAL_BASED_OUTPATIENT_CLINIC_OR_DEPARTMENT_OTHER)
Admission: EM | Admit: 2022-01-04 | Discharge: 2022-01-05 | Disposition: A | Discharge status: Home / Self Care | Payer: Medicare Other | Source: Home / Self Care | Attending: Emergency Medicine | Admitting: Emergency Medicine

## 2022-01-04 ENCOUNTER — Encounter (HOSPITAL_BASED_OUTPATIENT_CLINIC_OR_DEPARTMENT_OTHER): Payer: Self-pay

## 2022-01-04 ENCOUNTER — Encounter: Payer: Self-pay | Admitting: Family Medicine

## 2022-01-04 ENCOUNTER — Telehealth: Payer: Self-pay | Admitting: Nurse Practitioner

## 2022-01-04 ENCOUNTER — Emergency Department (HOSPITAL_BASED_OUTPATIENT_CLINIC_OR_DEPARTMENT_OTHER): Payer: Medicare Other

## 2022-01-04 DIAGNOSIS — Z86711 Personal history of pulmonary embolism: Secondary | ICD-10-CM | POA: Diagnosis not present

## 2022-01-04 DIAGNOSIS — K449 Diaphragmatic hernia without obstruction or gangrene: Secondary | ICD-10-CM | POA: Diagnosis not present

## 2022-01-04 DIAGNOSIS — Z9911 Dependence on respirator [ventilator] status: Secondary | ICD-10-CM | POA: Diagnosis not present

## 2022-01-04 DIAGNOSIS — E46 Unspecified protein-calorie malnutrition: Secondary | ICD-10-CM | POA: Diagnosis not present

## 2022-01-04 DIAGNOSIS — I4891 Unspecified atrial fibrillation: Secondary | ICD-10-CM | POA: Diagnosis not present

## 2022-01-04 DIAGNOSIS — I5032 Chronic diastolic (congestive) heart failure: Secondary | ICD-10-CM | POA: Diagnosis present

## 2022-01-04 DIAGNOSIS — Z515 Encounter for palliative care: Secondary | ICD-10-CM | POA: Diagnosis not present

## 2022-01-04 DIAGNOSIS — K56600 Partial intestinal obstruction, unspecified as to cause: Secondary | ICD-10-CM | POA: Diagnosis not present

## 2022-01-04 DIAGNOSIS — Z91040 Latex allergy status: Secondary | ICD-10-CM | POA: Insufficient documentation

## 2022-01-04 DIAGNOSIS — R112 Nausea with vomiting, unspecified: Secondary | ICD-10-CM | POA: Diagnosis not present

## 2022-01-04 DIAGNOSIS — R109 Unspecified abdominal pain: Secondary | ICD-10-CM | POA: Diagnosis not present

## 2022-01-04 DIAGNOSIS — R579 Shock, unspecified: Secondary | ICD-10-CM | POA: Diagnosis not present

## 2022-01-04 DIAGNOSIS — E1122 Type 2 diabetes mellitus with diabetic chronic kidney disease: Secondary | ICD-10-CM | POA: Diagnosis present

## 2022-01-04 DIAGNOSIS — J9690 Respiratory failure, unspecified, unspecified whether with hypoxia or hypercapnia: Secondary | ICD-10-CM | POA: Diagnosis not present

## 2022-01-04 DIAGNOSIS — J690 Pneumonitis due to inhalation of food and vomit: Secondary | ICD-10-CM | POA: Diagnosis not present

## 2022-01-04 DIAGNOSIS — A419 Sepsis, unspecified organism: Secondary | ICD-10-CM | POA: Diagnosis not present

## 2022-01-04 DIAGNOSIS — R1084 Generalized abdominal pain: Secondary | ICD-10-CM | POA: Diagnosis not present

## 2022-01-04 DIAGNOSIS — G9341 Metabolic encephalopathy: Secondary | ICD-10-CM | POA: Diagnosis not present

## 2022-01-04 DIAGNOSIS — I132 Hypertensive heart and chronic kidney disease with heart failure and with stage 5 chronic kidney disease, or end stage renal disease: Secondary | ICD-10-CM | POA: Diagnosis not present

## 2022-01-04 DIAGNOSIS — R6521 Severe sepsis with septic shock: Secondary | ICD-10-CM | POA: Diagnosis not present

## 2022-01-04 DIAGNOSIS — Y658 Other specified misadventures during surgical and medical care: Secondary | ICD-10-CM | POA: Diagnosis not present

## 2022-01-04 DIAGNOSIS — J9601 Acute respiratory failure with hypoxia: Secondary | ICD-10-CM | POA: Diagnosis not present

## 2022-01-04 DIAGNOSIS — J9602 Acute respiratory failure with hypercapnia: Secondary | ICD-10-CM | POA: Diagnosis not present

## 2022-01-04 DIAGNOSIS — D72829 Elevated white blood cell count, unspecified: Secondary | ICD-10-CM | POA: Diagnosis not present

## 2022-01-04 DIAGNOSIS — R600 Localized edema: Secondary | ICD-10-CM | POA: Diagnosis not present

## 2022-01-04 DIAGNOSIS — E039 Hypothyroidism, unspecified: Secondary | ICD-10-CM | POA: Insufficient documentation

## 2022-01-04 DIAGNOSIS — K6389 Other specified diseases of intestine: Secondary | ICD-10-CM | POA: Diagnosis not present

## 2022-01-04 DIAGNOSIS — K529 Noninfective gastroenteritis and colitis, unspecified: Secondary | ICD-10-CM | POA: Insufficient documentation

## 2022-01-04 DIAGNOSIS — K565 Intestinal adhesions [bands], unspecified as to partial versus complete obstruction: Secondary | ICD-10-CM | POA: Diagnosis not present

## 2022-01-04 DIAGNOSIS — K9171 Accidental puncture and laceration of a digestive system organ or structure during a digestive system procedure: Secondary | ICD-10-CM | POA: Diagnosis not present

## 2022-01-04 DIAGNOSIS — K9189 Other postprocedural complications and disorders of digestive system: Secondary | ICD-10-CM | POA: Diagnosis not present

## 2022-01-04 DIAGNOSIS — E7800 Pure hypercholesterolemia, unspecified: Secondary | ICD-10-CM | POA: Diagnosis not present

## 2022-01-04 DIAGNOSIS — J9811 Atelectasis: Secondary | ICD-10-CM | POA: Diagnosis not present

## 2022-01-04 DIAGNOSIS — Z7189 Other specified counseling: Secondary | ICD-10-CM | POA: Diagnosis not present

## 2022-01-04 DIAGNOSIS — J449 Chronic obstructive pulmonary disease, unspecified: Secondary | ICD-10-CM | POA: Diagnosis not present

## 2022-01-04 DIAGNOSIS — Z4682 Encounter for fitting and adjustment of non-vascular catheter: Secondary | ICD-10-CM | POA: Diagnosis not present

## 2022-01-04 DIAGNOSIS — I700 Atherosclerosis of aorta: Secondary | ICD-10-CM | POA: Diagnosis not present

## 2022-01-04 DIAGNOSIS — R52 Pain, unspecified: Secondary | ICD-10-CM | POA: Diagnosis not present

## 2022-01-04 DIAGNOSIS — R918 Other nonspecific abnormal finding of lung field: Secondary | ICD-10-CM | POA: Diagnosis not present

## 2022-01-04 DIAGNOSIS — K5939 Other megacolon: Secondary | ICD-10-CM | POA: Diagnosis not present

## 2022-01-04 DIAGNOSIS — M47814 Spondylosis without myelopathy or radiculopathy, thoracic region: Secondary | ICD-10-CM | POA: Diagnosis not present

## 2022-01-04 DIAGNOSIS — G2581 Restless legs syndrome: Secondary | ICD-10-CM | POA: Diagnosis present

## 2022-01-04 DIAGNOSIS — I10 Essential (primary) hypertension: Secondary | ICD-10-CM | POA: Diagnosis not present

## 2022-01-04 DIAGNOSIS — R4182 Altered mental status, unspecified: Secondary | ICD-10-CM | POA: Diagnosis not present

## 2022-01-04 DIAGNOSIS — N179 Acute kidney failure, unspecified: Secondary | ICD-10-CM | POA: Diagnosis not present

## 2022-01-04 DIAGNOSIS — R1033 Periumbilical pain: Secondary | ICD-10-CM | POA: Diagnosis not present

## 2022-01-04 DIAGNOSIS — D509 Iron deficiency anemia, unspecified: Secondary | ICD-10-CM | POA: Diagnosis present

## 2022-01-04 DIAGNOSIS — Z992 Dependence on renal dialysis: Secondary | ICD-10-CM | POA: Diagnosis not present

## 2022-01-04 DIAGNOSIS — Z6841 Body Mass Index (BMI) 40.0 and over, adult: Secondary | ICD-10-CM | POA: Diagnosis not present

## 2022-01-04 DIAGNOSIS — Z79899 Other long term (current) drug therapy: Secondary | ICD-10-CM | POA: Insufficient documentation

## 2022-01-04 DIAGNOSIS — K56609 Unspecified intestinal obstruction, unspecified as to partial versus complete obstruction: Secondary | ICD-10-CM | POA: Diagnosis not present

## 2022-01-04 DIAGNOSIS — Z4901 Encounter for fitting and adjustment of extracorporeal dialysis catheter: Secondary | ICD-10-CM | POA: Diagnosis not present

## 2022-01-04 DIAGNOSIS — J95821 Acute postprocedural respiratory failure: Secondary | ICD-10-CM | POA: Diagnosis not present

## 2022-01-04 DIAGNOSIS — E440 Moderate protein-calorie malnutrition: Secondary | ICD-10-CM | POA: Diagnosis not present

## 2022-01-04 DIAGNOSIS — I4819 Other persistent atrial fibrillation: Secondary | ICD-10-CM | POA: Diagnosis present

## 2022-01-04 DIAGNOSIS — K5651 Intestinal adhesions [bands], with partial obstruction: Secondary | ICD-10-CM | POA: Diagnosis not present

## 2022-01-04 DIAGNOSIS — Z66 Do not resuscitate: Secondary | ICD-10-CM | POA: Diagnosis not present

## 2022-01-04 DIAGNOSIS — J811 Chronic pulmonary edema: Secondary | ICD-10-CM | POA: Diagnosis not present

## 2022-01-04 DIAGNOSIS — J90 Pleural effusion, not elsewhere classified: Secondary | ICD-10-CM | POA: Diagnosis not present

## 2022-01-04 DIAGNOSIS — R1030 Lower abdominal pain, unspecified: Secondary | ICD-10-CM | POA: Diagnosis not present

## 2022-01-04 DIAGNOSIS — R140 Abdominal distension (gaseous): Secondary | ICD-10-CM | POA: Diagnosis not present

## 2022-01-04 DIAGNOSIS — F32A Depression, unspecified: Secondary | ICD-10-CM | POA: Diagnosis present

## 2022-01-04 DIAGNOSIS — R197 Diarrhea, unspecified: Secondary | ICD-10-CM | POA: Diagnosis not present

## 2022-01-04 DIAGNOSIS — N185 Chronic kidney disease, stage 5: Secondary | ICD-10-CM | POA: Diagnosis not present

## 2022-01-04 DIAGNOSIS — N170 Acute kidney failure with tubular necrosis: Secondary | ICD-10-CM | POA: Diagnosis not present

## 2022-01-04 LAB — URINALYSIS, ROUTINE W REFLEX MICROSCOPIC
Bilirubin Urine: NEGATIVE
Glucose, UA: NEGATIVE mg/dL
Hgb urine dipstick: NEGATIVE
Ketones, ur: NEGATIVE mg/dL
Leukocytes,Ua: NEGATIVE
Nitrite: NEGATIVE
Specific Gravity, Urine: 1.016 (ref 1.005–1.030)
pH: 5 (ref 5.0–8.0)

## 2022-01-04 LAB — CBC
HCT: 36.7 % (ref 36.0–46.0)
Hemoglobin: 12 g/dL (ref 12.0–15.0)
MCH: 28.1 pg (ref 26.0–34.0)
MCHC: 32.7 g/dL (ref 30.0–36.0)
MCV: 85.9 fL (ref 80.0–100.0)
Platelets: 455 10*3/uL — ABNORMAL HIGH (ref 150–400)
RBC: 4.27 MIL/uL (ref 3.87–5.11)
RDW: 17.6 % — ABNORMAL HIGH (ref 11.5–15.5)
WBC: 15.1 10*3/uL — ABNORMAL HIGH (ref 4.0–10.5)
nRBC: 0 % (ref 0.0–0.2)

## 2022-01-04 LAB — COMPREHENSIVE METABOLIC PANEL
ALT: 8 U/L (ref 0–44)
AST: 15 U/L (ref 15–41)
Albumin: 4.3 g/dL (ref 3.5–5.0)
Alkaline Phosphatase: 127 U/L — ABNORMAL HIGH (ref 38–126)
Anion gap: 14 (ref 5–15)
BUN: 18 mg/dL (ref 8–23)
CO2: 22 mmol/L (ref 22–32)
Calcium: 9.5 mg/dL (ref 8.9–10.3)
Chloride: 102 mmol/L (ref 98–111)
Creatinine, Ser: 1.09 mg/dL — ABNORMAL HIGH (ref 0.44–1.00)
GFR, Estimated: 53 mL/min — ABNORMAL LOW (ref >60)
Glucose, Bld: 132 mg/dL — ABNORMAL HIGH (ref 70–99)
Potassium: 4 mmol/L (ref 3.5–5.1)
Sodium: 138 mmol/L (ref 135–145)
Total Bilirubin: 0.6 mg/dL (ref 0.3–1.2)
Total Protein: 7.8 g/dL (ref 6.5–8.1)

## 2022-01-04 LAB — LACTIC ACID, PLASMA
Lactic Acid, Venous: 1.2 mmol/L (ref 0.5–1.9)
Lactic Acid, Venous: 1.1 mmol/L (ref 0.5–1.9)

## 2022-01-04 LAB — LIPASE, BLOOD: Lipase: 23 U/L (ref 11–51)

## 2022-01-04 MED ORDER — ONDANSETRON HCL 4 MG/2ML IJ SOLN
4.0000 mg | Freq: Once | INTRAMUSCULAR | Status: AC
  Administered 2022-01-04: 4 mg via INTRAVENOUS
  Filled 2022-01-04: qty 2

## 2022-01-04 MED ORDER — SODIUM CHLORIDE 0.9 % IV BOLUS
500.0000 mL | Freq: Once | INTRAVENOUS | Status: AC
  Administered 2022-01-04: 500 mL via INTRAVENOUS

## 2022-01-04 MED ORDER — HYDROMORPHONE HCL 1 MG/ML IJ SOLN
0.5000 mg | Freq: Once | INTRAMUSCULAR | Status: AC
  Administered 2022-01-04: 0.5 mg via INTRAVENOUS
  Filled 2022-01-04: qty 1

## 2022-01-04 MED ORDER — IOHEXOL 300 MG/ML  SOLN
100.0000 mL | Freq: Once | INTRAMUSCULAR | Status: AC | PRN
  Administered 2022-01-04: 100 mL via INTRAVENOUS

## 2022-01-04 MED ORDER — SODIUM CHLORIDE 0.9 % IV SOLN: INTRAVENOUS | Status: DC

## 2022-01-04 MED ORDER — ONDANSETRON 4 MG PO TBDP: 4.0000 mg | ORAL_TABLET | Freq: Three times a day (TID) | ORAL | 1 refills | Status: DC | PRN

## 2022-01-04 NOTE — Progress Notes (Signed)
   Virtual Visit  Note Due to COVID-19 pandemic this visit was conducted virtually. This visit type was conducted due to national recommendations for restrictions regarding the COVID-19 Pandemic (e.g. social distancing, sheltering in place) in an effort to limit this patient's exposure and mitigate transmission in our community. All issues noted in this document were discussed and addressed.  A physical exam was not performed with this format.  I connected with [name] on 01/04/22 at 1128 by telephone and verified that I am speaking with the correct person using two identifiers. [name] is currently located at home and no one is currently with her during the visit. The provider, Gwenlyn Perking, FNP is located in their office at time of visit.  I discussed the limitations, risks, security and privacy concerns of performing an evaluation and management service by telephone and the availability of in person appointments. I also discussed with the patient that there may be a patient responsible charge related to this service. The patient expressed understanding and agreed to proceed.   History and Present Illness:  Abdominal Pain The current episode started yesterday. The onset quality is sudden. The problem occurs constantly. The problem has been unchanged. The pain is located in the periumbilical region and suprapubic region. The pain is at a severity of 10/10. The quality of the pain is sharp. Pain radiation: lower mid back. Associated symptoms include anorexia, belching, diarrhea, flatus, nausea and vomiting (6-7x NBNB). Pertinent negatives include no constipation, dysuria, fever, frequency or hematuria. The pain is aggravated by certain positions. The pain is relieved by Belching. She has tried H2 blockers and proton pump inhibitors for the symptoms. The treatment provided mild relief.     Review of Systems  Constitutional:  Negative for fever.  Gastrointestinal:  Positive for  abdominal pain, anorexia, diarrhea, flatus, nausea and vomiting (6-7x NBNB). Negative for constipation.  Genitourinary:  Negative for dysuria, frequency and hematuria.     Observations/Objective: Alert and oriented x 3. Able to speak in full sentences without difficulty.   Assessment and Plan: Diagnoses and all orders for this visit:  Periumbilical abdominal pain Nausea and vomiting, unspecified vomiting type Report sudden onset severe periumbilical pain with vomiting and diarrhea. No fever. Discussed need to be seen in the ER for further evaluation for acute abdomen. She will have a family member take her now.     Follow Up Instructions: Go to ER for further evaluation.     I discussed the assessment and treatment plan with the patient. The patient was provided an opportunity to ask questions and all were answered. The patient agreed with the plan and demonstrated an understanding of the instructions.   The patient was advised to call back or seek an in-person evaluation if the symptoms worsen or if the condition fails to improve as anticipated.  The above assessment and management plan was discussed with the patient. The patient verbalized understanding of and has agreed to the management plan. Patient is aware to call the clinic if symptoms persist or worsen. Patient is aware when to return to the clinic for a follow-up visit. Patient educated on when it is appropriate to go to the emergency department.   Time call ended:  1142  I provided 16 minutes of  non face-to-face time during this encounter.    Gwenlyn Perking, FNP

## 2022-01-04 NOTE — ED Notes (Signed)
Patient transported to CT 

## 2022-01-04 NOTE — Telephone Encounter (Signed)
Patient aware and verbalized understanding. Patient has already spoke to provider and going to ER

## 2022-01-04 NOTE — ED Triage Notes (Signed)
Patient here POV from Home.  Endorses Constant ABD Pain since Last PM. Pain is Ache-Like and is Mainly located across Lower ABD.   Associated with Some N/V/D. No Known Fevers.   NAD Noted during Triage. A&Ox4. GCS 15. BIB Wheelchair.

## 2022-01-04 NOTE — Discharge Instructions (Addendum)
Work-up most likely consistent with a viral gastroenteritis stomach bug.  Therefore would expect significantly improvement.  Return for any significant abdominal pain or persistent nausea and vomiting.  Any blood in the vomit or bowel movements.  As we discussed there was some concern raised about ischemic bowel but both lactic acids were very normal so this is highly unlikely.  Take the Zofran as needed for nausea and vomiting.

## 2022-01-04 NOTE — ED Notes (Signed)
Pt sleeping, oxygen sats dropped. Pt placed on oxygen 2 liters via nasal canula

## 2022-01-04 NOTE — Telephone Encounter (Signed)
Pts brother called stating that pt has been having some stomach pain and needs advise of what she can take OTC to help get some relief.

## 2022-01-04 NOTE — ED Notes (Signed)
Per CT, pt needs to wait 12 hours from last dose of IV contrast before they can give her contrast again.

## 2022-01-04 NOTE — Telephone Encounter (Signed)
If the stomach pain is related to GERD she can take Tums. If the pain is something else she needs to be seen to rule out other serious conditions.

## 2022-01-04 NOTE — ED Provider Notes (Signed)
[facility] DEPT Provider Note   CSN: [id number] Arrival date & time: 01/04/22  1417     History {Add pertinent medical, surgical, social history, OB history to HPI:1} Chief Complaint  Patient presents with   Abdominal Pain    [name] is a 76 y.o. female.  HPI     Home Medications Prior to Admission medications   Medication Sig Start Date End Date Taking? Authorizing Provider  acetaminophen (TYLENOL) 650 MG CR tablet Take 650 mg by mouth every 8 (eight) hours as needed for pain.    [provider]  ALPRAZolam Duanne Moron) 0.5 MG tablet Take 1 tablet (0.5 mg total) by mouth 2 (two) times daily as needed for anxiety (use SPARINGLY). 10/30/21   Hassell Done, Mary-Margaret, FNP  calcium carbonate (OS-CAL - DOSED IN MG OF ELEMENTAL CALCIUM) 1250 (500 Ca) MG tablet Take 1 tablet by mouth once a week.     [provider]  Cholecalciferol (VITAMIN D3) 10 MCG (400 UNIT) CAPS Take 1 capsule by mouth daily.    [provider]  escitalopram (LEXAPRO) 10 MG tablet Take 1 tablet (10 mg total) by mouth daily. 10/30/21   Chevis Pretty, FNP  furosemide (LASIX) 20 MG tablet Take 1 tablet (20 mg total) by mouth every Monday, Wednesday, and Friday. May take additional 20 mg for swelling 12/05/21 05/22/22  Arnoldo Lenis, MD  levothyroxine (SYNTHROID) 50 MCG tablet TAKE ONE (1) TABLET EACH DAY 10/30/21   Hassell Done, Mary-Margaret, FNP  Multiple Vitamin (MULTIVITAMIN) tablet Take 1 tablet by mouth 2 (two) times a week.     [provider]  omeprazole-sodium bicarbonate (ZEGERID) 40-1100 MG capsule Take 1 capsule by mouth daily before breakfast. 10/30/21   Hassell Done, Mary-Margaret, FNP  oxyCODONE-acetaminophen (PERCOCET) 5-325 MG tablet Take 1 tablet every 6 hours by oral route as needed for 5 days. 12/31/19   [provider]  predniSONE (DELTASONE) 5 MG tablet  12/23/19   [provider]  telmisartan (MICARDIS) 80 MG tablet Take 1 tablet  (80 mg total) by mouth daily. 09/28/21   Chevis Pretty, FNP      Allergies    Celebrex [celecoxib], Keflet [cephalexin], Penicillins, Sulfa antibiotics, Kenalog [triamcinolone acetonide], Latex, Lisinopril, and Mobic [meloxicam]    Review of Systems   Review of Systems  Physical Exam Updated Vital Signs BP (!) 158/94 (BP Location: Left Arm)   Pulse 78   Temp 98.4 F (36.9 C)   Resp 16   Ht 1.626 m ('5\' 4"'$ )   Wt 78.7 kg   SpO2 100%   BMI 29.78 kg/m  Physical Exam  ED Results / Procedures / Treatments   Labs (all labs ordered are listed, but only abnormal results are displayed) Labs Reviewed  COMPREHENSIVE METABOLIC PANEL - Abnormal; Notable for the following components:      Result Value   Glucose, Bld 132 (*)    Creatinine, Ser 1.09 (*)    Alkaline Phosphatase 127 (*)    GFR, Estimated 53 (*)    All other components within normal limits  CBC - Abnormal; Notable for the following components:   WBC 15.1 (*)    RDW 17.6 (*)    Platelets 455 (*)    All other components within normal limits  URINALYSIS, ROUTINE W REFLEX MICROSCOPIC - Abnormal; Notable for the following components:   Protein, ur TRACE (*)    All other components within normal limits  LIPASE, BLOOD    EKG None  Radiology No results found.  Procedures Procedures  {Document cardiac monitor, telemetry assessment procedure when appropriate:1}  Medications Ordered in ED Medications - No data to display  ED Course/ Medical Decision Making/ A&P                           Medical Decision Making Amount and/or Complexity of Data Reviewed Labs: ordered.   ***  {Document critical care time when appropriate:1} {Document review of labs and clinical decision tools ie heart score, Chads2Vasc2 etc:1}  {Document your independent review of radiology images, and any outside records:1} {Document your discussion with family members, caretakers, and with consultants:1} {Document social determinants of  health affecting pt's care:1} {Document your decision making why or why not admission, treatments were needed:1} Final Clinical Impression(s) / ED Diagnoses Final diagnoses:  None    Rx / DC Orders ED Discharge Orders     None

## 2022-01-07 ENCOUNTER — Emergency Department (HOSPITAL_COMMUNITY): Payer: Medicare Other

## 2022-01-07 ENCOUNTER — Inpatient Hospital Stay (HOSPITAL_COMMUNITY)
Admission: EM | Admit: 2022-01-07 | Discharge: 2022-02-04 | DRG: 329 | Disposition: A | Discharge status: Other Acute Inpatient Hospital | Payer: Medicare Other | Attending: Family Medicine | Admitting: Family Medicine

## 2022-01-07 ENCOUNTER — Other Ambulatory Visit: Payer: Self-pay

## 2022-01-07 ENCOUNTER — Inpatient Hospital Stay (HOSPITAL_COMMUNITY): Payer: Medicare Other

## 2022-01-07 ENCOUNTER — Encounter (HOSPITAL_COMMUNITY): Payer: Self-pay | Admitting: Emergency Medicine

## 2022-01-07 DIAGNOSIS — M47814 Spondylosis without myelopathy or radiculopathy, thoracic region: Secondary | ICD-10-CM | POA: Diagnosis not present

## 2022-01-07 DIAGNOSIS — Z66 Do not resuscitate: Secondary | ICD-10-CM | POA: Diagnosis not present

## 2022-01-07 DIAGNOSIS — J449 Chronic obstructive pulmonary disease, unspecified: Secondary | ICD-10-CM | POA: Diagnosis present

## 2022-01-07 DIAGNOSIS — A419 Sepsis, unspecified organism: Secondary | ICD-10-CM | POA: Diagnosis not present

## 2022-01-07 DIAGNOSIS — E46 Unspecified protein-calorie malnutrition: Secondary | ICD-10-CM | POA: Diagnosis not present

## 2022-01-07 DIAGNOSIS — J811 Chronic pulmonary edema: Secondary | ICD-10-CM | POA: Diagnosis not present

## 2022-01-07 DIAGNOSIS — E871 Hypo-osmolality and hyponatremia: Secondary | ICD-10-CM | POA: Diagnosis not present

## 2022-01-07 DIAGNOSIS — H9190 Unspecified hearing loss, unspecified ear: Secondary | ICD-10-CM | POA: Diagnosis present

## 2022-01-07 DIAGNOSIS — Z515 Encounter for palliative care: Secondary | ICD-10-CM | POA: Diagnosis not present

## 2022-01-07 DIAGNOSIS — G9341 Metabolic encephalopathy: Secondary | ICD-10-CM | POA: Diagnosis not present

## 2022-01-07 DIAGNOSIS — J9690 Respiratory failure, unspecified, unspecified whether with hypoxia or hypercapnia: Secondary | ICD-10-CM | POA: Diagnosis not present

## 2022-01-07 DIAGNOSIS — N170 Acute kidney failure with tubular necrosis: Secondary | ICD-10-CM | POA: Diagnosis present

## 2022-01-07 DIAGNOSIS — J90 Pleural effusion, not elsewhere classified: Secondary | ICD-10-CM | POA: Diagnosis present

## 2022-01-07 DIAGNOSIS — Y658 Other specified misadventures during surgical and medical care: Secondary | ICD-10-CM | POA: Diagnosis not present

## 2022-01-07 DIAGNOSIS — I890 Lymphedema, not elsewhere classified: Secondary | ICD-10-CM | POA: Diagnosis present

## 2022-01-07 DIAGNOSIS — Z992 Dependence on renal dialysis: Secondary | ICD-10-CM | POA: Diagnosis not present

## 2022-01-07 DIAGNOSIS — Z886 Allergy status to analgesic agent status: Secondary | ICD-10-CM

## 2022-01-07 DIAGNOSIS — K219 Gastro-esophageal reflux disease without esophagitis: Secondary | ICD-10-CM | POA: Diagnosis present

## 2022-01-07 DIAGNOSIS — D509 Iron deficiency anemia, unspecified: Secondary | ICD-10-CM | POA: Diagnosis present

## 2022-01-07 DIAGNOSIS — K6389 Other specified diseases of intestine: Secondary | ICD-10-CM | POA: Diagnosis not present

## 2022-01-07 DIAGNOSIS — I700 Atherosclerosis of aorta: Secondary | ICD-10-CM | POA: Diagnosis not present

## 2022-01-07 DIAGNOSIS — Z880 Allergy status to penicillin: Secondary | ICD-10-CM

## 2022-01-07 DIAGNOSIS — N185 Chronic kidney disease, stage 5: Secondary | ICD-10-CM | POA: Diagnosis present

## 2022-01-07 DIAGNOSIS — E876 Hypokalemia: Secondary | ICD-10-CM | POA: Diagnosis not present

## 2022-01-07 DIAGNOSIS — Z4682 Encounter for fitting and adjustment of non-vascular catheter: Secondary | ICD-10-CM | POA: Diagnosis not present

## 2022-01-07 DIAGNOSIS — K56609 Unspecified intestinal obstruction, unspecified as to partial versus complete obstruction: Principal | ICD-10-CM

## 2022-01-07 DIAGNOSIS — M810 Age-related osteoporosis without current pathological fracture: Secondary | ICD-10-CM | POA: Diagnosis present

## 2022-01-07 DIAGNOSIS — N179 Acute kidney failure, unspecified: Secondary | ICD-10-CM | POA: Diagnosis not present

## 2022-01-07 DIAGNOSIS — Z91040 Latex allergy status: Secondary | ICD-10-CM

## 2022-01-07 DIAGNOSIS — Z4901 Encounter for fitting and adjustment of extracorporeal dialysis catheter: Secondary | ICD-10-CM | POA: Diagnosis not present

## 2022-01-07 DIAGNOSIS — L8990 Pressure ulcer of unspecified site, unspecified stage: Secondary | ICD-10-CM | POA: Insufficient documentation

## 2022-01-07 DIAGNOSIS — D72829 Elevated white blood cell count, unspecified: Secondary | ICD-10-CM | POA: Diagnosis not present

## 2022-01-07 DIAGNOSIS — E11649 Type 2 diabetes mellitus with hypoglycemia without coma: Secondary | ICD-10-CM | POA: Diagnosis not present

## 2022-01-07 DIAGNOSIS — E785 Hyperlipidemia, unspecified: Secondary | ICD-10-CM | POA: Diagnosis present

## 2022-01-07 DIAGNOSIS — I10 Essential (primary) hypertension: Secondary | ICD-10-CM | POA: Diagnosis present

## 2022-01-07 DIAGNOSIS — E440 Moderate protein-calorie malnutrition: Secondary | ICD-10-CM | POA: Diagnosis not present

## 2022-01-07 DIAGNOSIS — Z882 Allergy status to sulfonamides status: Secondary | ICD-10-CM

## 2022-01-07 DIAGNOSIS — E872 Acidosis, unspecified: Secondary | ICD-10-CM | POA: Diagnosis present

## 2022-01-07 DIAGNOSIS — K56600 Partial intestinal obstruction, unspecified as to cause: Secondary | ICD-10-CM

## 2022-01-07 DIAGNOSIS — R6521 Severe sepsis with septic shock: Secondary | ICD-10-CM | POA: Diagnosis not present

## 2022-01-07 DIAGNOSIS — R109 Unspecified abdominal pain: Secondary | ICD-10-CM | POA: Diagnosis not present

## 2022-01-07 DIAGNOSIS — L89812 Pressure ulcer of head, stage 2: Secondary | ICD-10-CM | POA: Diagnosis not present

## 2022-01-07 DIAGNOSIS — R112 Nausea with vomiting, unspecified: Secondary | ICD-10-CM | POA: Diagnosis not present

## 2022-01-07 DIAGNOSIS — R4182 Altered mental status, unspecified: Secondary | ICD-10-CM | POA: Diagnosis not present

## 2022-01-07 DIAGNOSIS — B9621 Shiga toxin-producing Escherichia coli [E. coli] (STEC) O157 as the cause of diseases classified elsewhere: Secondary | ICD-10-CM | POA: Diagnosis not present

## 2022-01-07 DIAGNOSIS — R918 Other nonspecific abnormal finding of lung field: Secondary | ICD-10-CM | POA: Diagnosis not present

## 2022-01-07 DIAGNOSIS — K567 Ileus, unspecified: Secondary | ICD-10-CM | POA: Diagnosis not present

## 2022-01-07 DIAGNOSIS — K9171 Accidental puncture and laceration of a digestive system organ or structure during a digestive system procedure: Secondary | ICD-10-CM | POA: Diagnosis not present

## 2022-01-07 DIAGNOSIS — R627 Adult failure to thrive: Secondary | ICD-10-CM | POA: Diagnosis present

## 2022-01-07 DIAGNOSIS — L89152 Pressure ulcer of sacral region, stage 2: Secondary | ICD-10-CM | POA: Diagnosis not present

## 2022-01-07 DIAGNOSIS — I5032 Chronic diastolic (congestive) heart failure: Secondary | ICD-10-CM | POA: Diagnosis present

## 2022-01-07 DIAGNOSIS — R52 Pain, unspecified: Secondary | ICD-10-CM | POA: Diagnosis not present

## 2022-01-07 DIAGNOSIS — Z86711 Personal history of pulmonary embolism: Secondary | ICD-10-CM

## 2022-01-07 DIAGNOSIS — E039 Hypothyroidism, unspecified: Secondary | ICD-10-CM | POA: Diagnosis not present

## 2022-01-07 DIAGNOSIS — J9601 Acute respiratory failure with hypoxia: Secondary | ICD-10-CM | POA: Diagnosis not present

## 2022-01-07 DIAGNOSIS — R579 Shock, unspecified: Secondary | ICD-10-CM | POA: Diagnosis not present

## 2022-01-07 DIAGNOSIS — E861 Hypovolemia: Secondary | ICD-10-CM | POA: Diagnosis not present

## 2022-01-07 DIAGNOSIS — E7800 Pure hypercholesterolemia, unspecified: Secondary | ICD-10-CM | POA: Diagnosis not present

## 2022-01-07 DIAGNOSIS — I472 Ventricular tachycardia, unspecified: Secondary | ICD-10-CM | POA: Diagnosis not present

## 2022-01-07 DIAGNOSIS — J690 Pneumonitis due to inhalation of food and vomit: Secondary | ICD-10-CM | POA: Diagnosis not present

## 2022-01-07 DIAGNOSIS — R197 Diarrhea, unspecified: Secondary | ICD-10-CM | POA: Diagnosis not present

## 2022-01-07 DIAGNOSIS — K559 Vascular disorder of intestine, unspecified: Secondary | ICD-10-CM | POA: Diagnosis not present

## 2022-01-07 DIAGNOSIS — Z833 Family history of diabetes mellitus: Secondary | ICD-10-CM

## 2022-01-07 DIAGNOSIS — E1122 Type 2 diabetes mellitus with diabetic chronic kidney disease: Secondary | ICD-10-CM | POA: Diagnosis present

## 2022-01-07 DIAGNOSIS — R1030 Lower abdominal pain, unspecified: Secondary | ICD-10-CM | POA: Diagnosis not present

## 2022-01-07 DIAGNOSIS — I4891 Unspecified atrial fibrillation: Secondary | ICD-10-CM | POA: Diagnosis not present

## 2022-01-07 DIAGNOSIS — K5651 Intestinal adhesions [bands], with partial obstruction: Principal | ICD-10-CM | POA: Diagnosis present

## 2022-01-07 DIAGNOSIS — J9602 Acute respiratory failure with hypercapnia: Secondary | ICD-10-CM | POA: Diagnosis not present

## 2022-01-07 DIAGNOSIS — Z981 Arthrodesis status: Secondary | ICD-10-CM

## 2022-01-07 DIAGNOSIS — D631 Anemia in chronic kidney disease: Secondary | ICD-10-CM | POA: Diagnosis present

## 2022-01-07 DIAGNOSIS — I132 Hypertensive heart and chronic kidney disease with heart failure and with stage 5 chronic kidney disease, or end stage renal disease: Secondary | ICD-10-CM | POA: Diagnosis present

## 2022-01-07 DIAGNOSIS — R140 Abdominal distension (gaseous): Secondary | ICD-10-CM | POA: Diagnosis not present

## 2022-01-07 DIAGNOSIS — Z7189 Other specified counseling: Secondary | ICD-10-CM | POA: Diagnosis not present

## 2022-01-07 DIAGNOSIS — D6489 Other specified anemias: Secondary | ICD-10-CM | POA: Diagnosis not present

## 2022-01-07 DIAGNOSIS — R1084 Generalized abdominal pain: Secondary | ICD-10-CM | POA: Diagnosis not present

## 2022-01-07 DIAGNOSIS — I4819 Other persistent atrial fibrillation: Secondary | ICD-10-CM | POA: Diagnosis present

## 2022-01-07 DIAGNOSIS — E860 Dehydration: Secondary | ICD-10-CM | POA: Diagnosis present

## 2022-01-07 DIAGNOSIS — Z79899 Other long term (current) drug therapy: Secondary | ICD-10-CM

## 2022-01-07 DIAGNOSIS — R34 Anuria and oliguria: Secondary | ICD-10-CM | POA: Diagnosis present

## 2022-01-07 DIAGNOSIS — Z881 Allergy status to other antibiotic agents status: Secondary | ICD-10-CM

## 2022-01-07 DIAGNOSIS — Z6841 Body Mass Index (BMI) 40.0 and over, adult: Secondary | ICD-10-CM | POA: Diagnosis not present

## 2022-01-07 DIAGNOSIS — D62 Acute posthemorrhagic anemia: Secondary | ICD-10-CM | POA: Diagnosis not present

## 2022-01-07 DIAGNOSIS — L89326 Pressure-induced deep tissue damage of left buttock: Secondary | ICD-10-CM | POA: Diagnosis not present

## 2022-01-07 DIAGNOSIS — F419 Anxiety disorder, unspecified: Secondary | ICD-10-CM | POA: Diagnosis present

## 2022-01-07 DIAGNOSIS — K5939 Other megacolon: Secondary | ICD-10-CM | POA: Diagnosis not present

## 2022-01-07 DIAGNOSIS — R600 Localized edema: Secondary | ICD-10-CM | POA: Diagnosis not present

## 2022-01-07 DIAGNOSIS — A044 Other intestinal Escherichia coli infections: Secondary | ICD-10-CM | POA: Diagnosis not present

## 2022-01-07 DIAGNOSIS — G2581 Restless legs syndrome: Secondary | ICD-10-CM | POA: Diagnosis present

## 2022-01-07 DIAGNOSIS — K565 Intestinal adhesions [bands], unspecified as to partial versus complete obstruction: Secondary | ICD-10-CM | POA: Diagnosis not present

## 2022-01-07 DIAGNOSIS — Z8249 Family history of ischemic heart disease and other diseases of the circulatory system: Secondary | ICD-10-CM

## 2022-01-07 DIAGNOSIS — K449 Diaphragmatic hernia without obstruction or gangrene: Secondary | ICD-10-CM | POA: Diagnosis not present

## 2022-01-07 DIAGNOSIS — J9811 Atelectasis: Secondary | ICD-10-CM | POA: Diagnosis not present

## 2022-01-07 DIAGNOSIS — F32A Depression, unspecified: Secondary | ICD-10-CM | POA: Diagnosis present

## 2022-01-07 DIAGNOSIS — J95821 Acute postprocedural respiratory failure: Secondary | ICD-10-CM | POA: Diagnosis not present

## 2022-01-07 DIAGNOSIS — I493 Ventricular premature depolarization: Secondary | ICD-10-CM | POA: Diagnosis not present

## 2022-01-07 DIAGNOSIS — K9189 Other postprocedural complications and disorders of digestive system: Secondary | ICD-10-CM | POA: Diagnosis not present

## 2022-01-07 DIAGNOSIS — Z79890 Hormone replacement therapy (postmenopausal): Secondary | ICD-10-CM

## 2022-01-07 DIAGNOSIS — Z9911 Dependence on respirator [ventilator] status: Secondary | ICD-10-CM | POA: Diagnosis not present

## 2022-01-07 DIAGNOSIS — E782 Mixed hyperlipidemia: Secondary | ICD-10-CM | POA: Diagnosis present

## 2022-01-07 LAB — LIPASE, BLOOD: Lipase: 23 U/L (ref 11–51)

## 2022-01-07 LAB — COMPREHENSIVE METABOLIC PANEL
ALT: 17 U/L (ref 0–44)
AST: 29 U/L (ref 15–41)
Albumin: 4 g/dL (ref 3.5–5.0)
Alkaline Phosphatase: 97 U/L (ref 38–126)
Anion gap: 12 (ref 5–15)
BUN: 42 mg/dL — ABNORMAL HIGH (ref 8–23)
CO2: 31 mmol/L (ref 22–32)
Calcium: 9.2 mg/dL (ref 8.9–10.3)
Chloride: 96 mmol/L — ABNORMAL LOW (ref 98–111)
Creatinine, Ser: 1.84 mg/dL — ABNORMAL HIGH (ref 0.44–1.00)
GFR, Estimated: 28 mL/min — ABNORMAL LOW (ref >60)
Glucose, Bld: 147 mg/dL — ABNORMAL HIGH (ref 70–99)
Potassium: 2.7 mmol/L — CL (ref 3.5–5.1)
Sodium: 139 mmol/L (ref 135–145)
Total Bilirubin: 1.2 mg/dL (ref 0.3–1.2)
Total Protein: 7.5 g/dL (ref 6.5–8.1)

## 2022-01-07 LAB — CBC
HCT: 36 % (ref 36.0–46.0)
HCT: 35.6 % — ABNORMAL LOW (ref 36.0–46.0)
Hemoglobin: 11.5 g/dL — ABNORMAL LOW (ref 12.0–15.0)
Hemoglobin: 11.5 g/dL — ABNORMAL LOW (ref 12.0–15.0)
MCH: 27.8 pg (ref 26.0–34.0)
MCH: 28.3 pg (ref 26.0–34.0)
MCHC: 31.9 g/dL (ref 30.0–36.0)
MCHC: 32.3 g/dL (ref 30.0–36.0)
MCV: 87 fL (ref 80.0–100.0)
MCV: 87.5 fL (ref 80.0–100.0)
Platelets: 350 10*3/uL (ref 150–400)
Platelets: 316 10*3/uL (ref 150–400)
RBC: 4.14 MIL/uL (ref 3.87–5.11)
RBC: 4.07 MIL/uL (ref 3.87–5.11)
RDW: 16.7 % — ABNORMAL HIGH (ref 11.5–15.5)
RDW: 16.8 % — ABNORMAL HIGH (ref 11.5–15.5)
WBC: 9.8 10*3/uL (ref 4.0–10.5)
WBC: 13.9 10*3/uL — ABNORMAL HIGH (ref 4.0–10.5)
nRBC: 0 % (ref 0.0–0.2)
nRBC: 0 % (ref 0.0–0.2)

## 2022-01-07 LAB — MAGNESIUM: Magnesium: 2.3 mg/dL (ref 1.7–2.4)

## 2022-01-07 LAB — URINALYSIS, ROUTINE W REFLEX MICROSCOPIC
Bilirubin Urine: NEGATIVE
Glucose, UA: NEGATIVE mg/dL
Ketones, ur: NEGATIVE mg/dL
Nitrite: NEGATIVE
Protein, ur: 100 mg/dL — AB
Specific Gravity, Urine: 1.024 (ref 1.005–1.030)
pH: 5 (ref 5.0–8.0)

## 2022-01-07 LAB — CREATININE, SERUM
Creatinine, Ser: 1.82 mg/dL — ABNORMAL HIGH (ref 0.44–1.00)
GFR, Estimated: 29 mL/min — ABNORMAL LOW (ref >60)

## 2022-01-07 MED ORDER — ACETAMINOPHEN 650 MG RE SUPP: 650.0000 mg | Freq: Four times a day (QID) | RECTAL | Status: DC | PRN

## 2022-01-07 MED ORDER — POTASSIUM CHLORIDE 10 MEQ/100ML IV SOLN
10.0000 meq | Freq: Once | INTRAVENOUS | Status: AC
  Administered 2022-01-07: 10 meq via INTRAVENOUS
  Filled 2022-01-07: qty 100

## 2022-01-07 MED ORDER — SODIUM CHLORIDE 0.9 % IV SOLN
250.0000 mL | INTRAVENOUS | Status: DC | PRN
  Administered 2022-01-13 – 2022-01-15 (×2): 250 mL via INTRAVENOUS

## 2022-01-07 MED ORDER — POTASSIUM CHLORIDE IN NACL 40-0.9 MEQ/L-% IV SOLN
INTRAVENOUS | Status: DC
  Filled 2022-01-07 (×2): qty 1000

## 2022-01-07 MED ORDER — TRAZODONE HCL 50 MG PO TABS: 25.0000 mg | ORAL_TABLET | Freq: Every evening | ORAL | Status: DC | PRN

## 2022-01-07 MED ORDER — HEPARIN SODIUM (PORCINE) 5000 UNIT/ML IJ SOLN
5000.0000 [IU] | Freq: Three times a day (TID) | INTRAMUSCULAR | Status: DC
  Administered 2022-01-08 – 2022-01-13 (×17): 5000 [IU] via SUBCUTANEOUS
  Filled 2022-01-07 (×16): qty 1

## 2022-01-07 MED ORDER — SODIUM CHLORIDE 0.9% FLUSH
3.0000 mL | Freq: Two times a day (BID) | INTRAVENOUS | Status: DC
  Administered 2022-01-08 – 2022-01-24 (×19): 3 mL via INTRAVENOUS

## 2022-01-07 MED ORDER — RIVAROXABAN 10 MG PO TABS: 10.0000 mg | ORAL_TABLET | Freq: Every day | ORAL | Status: DC

## 2022-01-07 MED ORDER — MORPHINE SULFATE (PF) 2 MG/ML IV SOLN: 2.0000 mg | INTRAVENOUS | Status: DC | PRN

## 2022-01-07 MED ORDER — ONDANSETRON HCL 4 MG/2ML IJ SOLN: 4.0000 mg | Freq: Once | INTRAMUSCULAR | Status: DC

## 2022-01-07 MED ORDER — HEPARIN (PORCINE) 25000 UT/250ML-% IV SOLN
10.0000 [IU]/kg/h | INTRAVENOUS | Status: DC
Start: 2022-01-07 — End: 2022-01-07

## 2022-01-07 MED ORDER — ONDANSETRON HCL 4 MG/2ML IJ SOLN
4.0000 mg | Freq: Once | INTRAMUSCULAR | Status: AC
  Administered 2022-01-07: 4 mg via INTRAVENOUS

## 2022-01-07 MED ORDER — ONDANSETRON 4 MG PO TBDP
4.0000 mg | ORAL_TABLET | Freq: Once | ORAL | Status: AC | PRN
  Administered 2022-01-07: 4 mg via ORAL
  Filled 2022-01-07: qty 1

## 2022-01-07 MED ORDER — SODIUM CHLORIDE 0.9 % IV SOLN: INTRAVENOUS | Status: DC

## 2022-01-07 MED ORDER — POLYETHYLENE GLYCOL 3350 17 G PO PACK: 17.0000 g | PACK | Freq: Every day | ORAL | Status: DC

## 2022-01-07 MED ORDER — OXYCODONE HCL 5 MG PO TABS: 5.0000 mg | ORAL_TABLET | ORAL | Status: DC | PRN

## 2022-01-07 MED ORDER — MORPHINE SULFATE (PF) 4 MG/ML IV SOLN
4.0000 mg | INTRAVENOUS | Status: DC | PRN
  Administered 2022-01-08 (×3): 4 mg via INTRAVENOUS
  Filled 2022-01-07 (×3): qty 1

## 2022-01-07 MED ORDER — ONDANSETRON HCL 4 MG PO TABS: 4.0000 mg | ORAL_TABLET | Freq: Four times a day (QID) | ORAL | Status: DC | PRN

## 2022-01-07 MED ORDER — MORPHINE SULFATE (PF) 2 MG/ML IV SOLN
2.0000 mg | INTRAVENOUS | Status: DC | PRN
  Administered 2022-01-08 – 2022-01-11 (×3): 2 mg via INTRAVENOUS
  Filled 2022-01-07 (×3): qty 1

## 2022-01-07 MED ORDER — ACETAMINOPHEN 325 MG PO TABS: 650.0000 mg | ORAL_TABLET | Freq: Four times a day (QID) | ORAL | Status: DC | PRN

## 2022-01-07 MED ORDER — SODIUM CHLORIDE 0.9 % IV BOLUS
1000.0000 mL | Freq: Once | INTRAVENOUS | Status: AC
  Administered 2022-01-07: 1000 mL via INTRAVENOUS

## 2022-01-07 MED ORDER — ONDANSETRON HCL 4 MG/2ML IJ SOLN
4.0000 mg | Freq: Four times a day (QID) | INTRAMUSCULAR | Status: DC | PRN
  Administered 2022-01-08 – 2022-02-01 (×15): 4 mg via INTRAVENOUS
  Filled 2022-01-07 (×16): qty 2

## 2022-01-07 MED ORDER — PANTOPRAZOLE SODIUM 40 MG PO TBEC: 40.0000 mg | DELAYED_RELEASE_TABLET | Freq: Every day | ORAL | Status: DC

## 2022-01-07 MED ORDER — PANTOPRAZOLE SODIUM 40 MG IV SOLR
40.0000 mg | Freq: Every day | INTRAVENOUS | Status: DC
  Administered 2022-01-08: 40 mg via INTRAVENOUS
  Filled 2022-01-07: qty 10

## 2022-01-07 MED ORDER — ENOXAPARIN SODIUM 40 MG/0.4ML IJ SOSY: 40.0000 mg | PREFILLED_SYRINGE | INTRAMUSCULAR | Status: DC

## 2022-01-07 MED ORDER — SODIUM CHLORIDE 0.9% FLUSH
3.0000 mL | INTRAVENOUS | Status: DC | PRN
Start: 2022-01-07 — End: 2022-01-26
  Administered 2022-01-15: 3 mL via INTRAVENOUS

## 2022-01-07 NOTE — ED Triage Notes (Signed)
Pt BIB [facility] for abdominal pain with N/V/D since Thursday, sent at [facility] and diagnosed with virus, per pt symtpoms not any better.

## 2022-01-07 NOTE — ED Notes (Signed)
Patient's son reports his mother ate fruit that was spoiled

## 2022-01-07 NOTE — H&P (Signed)
History and Physical    Astha Probasco JSH:[id number] DOB: [birth year] DOA: 01/07/2022  PCP: Chevis Pretty, FNP  Patient coming from: Home  I have personally briefly reviewed patient's old medical records in [place]  Chief Complaint: Diarrhea and vomiting since Thursday  HPI: [name] is a 76 y.o. female with medical history significant of 76 year old female past medical history significant for chronic obstructive pulmonary disease, hyperlipidemia, essential hypertension, hypothyroidism, pulmonary embolism in the past, osteoarthritis, and depression who presents from home with vomiting and diarrhea since Friday.  Presented to urgent care on Friday with acute onset of abdominal pain a CT scan was obtained and was unremarkable she received some IV fluids and antiemetics and was discharged home.  Her V and D persisted and that previous treatment did not alleviate her symptoms so she presented here with her son today.  He provided some information which was not available on Friday.  Apparently the patient had eaten some fruit that she had kept outside on a porch but some wild animal had gotten into the fruit she cut away the parts that had been nibbled on and ate the fruit on Thursday.  She has been sick since then. In the ED a CT scan was obtained and found to show a partial small bowel obstruction and significant changes compared to the CT scan she had on Friday.  ED Course: Patient was given an oral dose of Zofran and some IV fluid drip.  She continued to have persistent nausea in the emergency department and vomited at least 200 mL during my exam.  She was given additional Zofran at that point in her IV.  Also had 1 episode of diarrhea while here in the emergency department which she describes as small. Labs are consistent with acute kidney injury and hypokalemia CT scan shows a partial small bowel obstruction.  I discussed the case with Dr. Doren Custard who has ordered an NG tube and  communicated with surgery who will see the patient in a.m.  Review of Systems: As per HPI otherwise all other systems reviewed and  negative.   Past Medical History:  Diagnosis Date   Allergy    Bronchitis, chronic (HCC)    Chronic anxiety    Complication of anesthesia    hard to wake up   COPD (chronic obstructive pulmonary disease) (HCC)    Depression    Esophageal reflux    Headache(784.0)    Hyperlipidemia    Hypertension    Hypothyroidism    Obesity    Osteoporosis    Overactive bladder    PVC's (premature ventricular contractions)    Thyroid disease    Vitamin D deficiency disease     Past Surgical History:  Procedure Laterality Date   ABDOMINAL HYSTERECTOMY     CHOLECYSTECTOMY N/A 11/04/2013   Procedure: LAPAROSCOPIC CHOLECYSTECTOMY WITH INTRAOPERATIVE CHOLANGIOGRAM;  Surgeon: Imogene Burn. Georgette Dover, MD;  Location: [place];  Service: General;  Laterality: N/A;   COLONOSCOPY     FRACTURE SURGERY     pins removed from Hip surgery   HIP FRACTURE SURGERY  1990    pins removed in [place]      Social History   Social History Narrative   Not on file     reports that she has never smoked. She has never used smokeless tobacco. She reports that she does not drink alcohol and does not use drugs.  Allergies  Allergen Reactions   Celebrex [Celecoxib] Swelling   Keflet [Cephalexin] Swelling   Penicillins Hives and Swelling    DID THE REACTION INVOLVE: Swelling of the face/tongue/throat, SOB, or low BP? Yes-swelling-hives Sudden or severe rash/hives, skin peeling, or the inside of the mouth or nose? Unknown Did it require medical treatment? Unknown When did it last happen?    over 10 years   If all above answers are "NO", may proceed with cephalosporin use.    Sulfa Antibiotics Swelling   Kenalog [Triamcinolone Acetonide]     unknown   Latex    Lisinopril Other (See Comments)    unknown    Mobic [Meloxicam]     SWELLING    Family History  Problem Relation Age of Onset   Arthritis Other    Heart disease Other    Cancer Other    Diabetes Other    OCD Other      Prior to Admission medications   Medication Sig Start Date End Date Taking? Authorizing Provider  acetaminophen (TYLENOL) 650 MG CR tablet Take 650 mg by mouth every 8 (eight) hours as needed for pain.    [provider]  ALPRAZolam Duanne Moron) 0.5 MG tablet Take 1 tablet (0.5 mg total) by mouth 2 (two) times daily as needed for anxiety (use SPARINGLY). 10/30/21   Hassell Done, Mary-Margaret, FNP  calcium carbonate (OS-CAL - DOSED IN MG OF ELEMENTAL CALCIUM) 1250 (500 Ca) MG tablet Take 1 tablet by mouth once a week.     [provider]  Cholecalciferol (VITAMIN D3) 10 MCG (400 UNIT) CAPS Take 1 capsule by mouth daily.    [provider]  escitalopram (LEXAPRO) 10 MG tablet Take 1 tablet (10 mg total) by mouth daily. 10/30/21   Chevis Pretty, FNP  furosemide (LASIX) 20 MG tablet Take 1 tablet (20 mg total) by mouth every Monday, Wednesday, and Friday. May take additional 20 mg for swelling 12/05/21 05/22/22  Arnoldo Lenis, MD  levothyroxine (SYNTHROID) 50 MCG tablet TAKE ONE (1) TABLET EACH DAY 10/30/21   Hassell Done, Mary-Margaret, FNP  Multiple Vitamin (MULTIVITAMIN) tablet Take 1 tablet by mouth 2 (two) times a week.     [provider]  omeprazole-sodium bicarbonate (ZEGERID) 40-1100 MG capsule Take 1 capsule by mouth daily before breakfast. 10/30/21   Hassell Done, Mary-Margaret, FNP  ondansetron (ZOFRAN-ODT) 4 MG disintegrating tablet Take 1 tablet (4 mg total) by mouth every 8 (eight) hours as needed for nausea or vomiting. 01/04/22   Fredia Sorrow, MD  oxyCODONE-acetaminophen (PERCOCET) 5-325 MG tablet Take 1 tablet every 6 hours by oral route as needed for 5 days. 12/31/19   [provider]  predniSONE (DELTASONE) 5 MG tablet  12/23/19   [provider]  telmisartan  (MICARDIS) 80 MG tablet Take 1 tablet (80 mg total) by mouth daily. 09/28/21   Chevis Pretty, FNP    Physical Exam:  Constitutional: NAD, calm, comfortable Vitals:   01/07/22 1139 01/07/22 1345 01/07/22 1400 01/07/22 1557  BP: 109/67 124/73 123/78 134/79  Pulse: 82   76  Resp: 17 (!) 27 (!) 25 16  Temp: 100.2 F (37.9 C)   99.1 F (37.3 C)  TempSrc: Oral   Oral  SpO2: 93%   96%  Weight:      Height:       Eyes: PERRL, lids and conjunctivae normal ENMT: Mucous membranes are moist. Posterior pharynx clear of any exudate or lesions.Normal dentition.  Neck: normal, supple, no masses, no thyromegaly Respiratory:  clear to auscultation bilaterally, no wheezing, no crackles. Normal respiratory effort. No accessory muscle use.  Cardiovascular: Regular rate and rhythm, no murmurs / rubs / gallops. No extremity edema. 2+ pedal pulses. No carotid bruits.  Abdomen: no tenderness, no masses palpated. No hepatosplenomegaly. Bowel sounds quiet Musculoskeletal: no clubbing / cyanosis. No joint deformity upper and lower extremities. Good ROM, no contractures. Normal muscle tone.  Skin: no rashes, lesions, ulcers. No induration Neurologic: CN 2-12 grossly intact. Sensation intact, DTR normal. Strength 5/5 in all 4.  Psychiatric: Normal judgment and insight. Alert and oriented x 3. Normal mood.    Labs on Admission: I have personally reviewed following labs and imaging studies  CBC: Recent Labs  Lab 01/04/22 1516 01/07/22 1227  WBC 15.1* 9.8  HGB 12.0 11.5*  HCT 36.7 36.0  MCV 85.9 87.0  PLT 455* 557   Basic Metabolic Panel: Recent Labs  Lab 01/04/22 1516 01/07/22 1227  NA 138 139  K 4.0 2.7*  CL 102 96*  CO2 22 31  GLUCOSE 132* 147*  BUN 18 42*  CREATININE 1.09* 1.84*  CALCIUM 9.5 9.2   GFR: Estimated Creatinine Clearance: 26.8 mL/min (A) (by C-G formula based on SCr of 1.84 mg/dL (H)). Liver Function Tests: Recent Labs  Lab 01/04/22 1516 01/07/22 1227  AST 15 29   ALT 8 17  ALKPHOS 127* 97  BILITOT 0.6 1.2  PROT 7.8 7.5  ALBUMIN 4.3 4.0   Recent Labs  Lab 01/04/22 1516 01/07/22 1227  LIPASE 23 23    Urine analysis:    Component Value Date/Time   COLORURINE YELLOW 01/07/2022 1142   APPEARANCEUR TURBID (A) 01/07/2022 1142   APPEARANCEUR Clear 03/01/2021 1257   LABSPEC 1.024 01/07/2022 1142   PHURINE 5.0 01/07/2022 1142   GLUCOSEU NEGATIVE 01/07/2022 1142   HGBUR MODERATE (A) 01/07/2022 1142   BILIRUBINUR NEGATIVE 01/07/2022 1142   BILIRUBINUR Negative 03/01/2021 1257   [place] 01/07/2022 1142   PROTEINUR 100 (A) 01/07/2022 1142   NITRITE NEGATIVE 01/07/2022 1142   LEUKOCYTESUR MODERATE (A) 01/07/2022 1142    Radiological Exams on Admission: DG Abdomen 1 View  Result Date: 01/07/2022 CLINICAL DATA:  check NG tube placement EXAM: ABDOMEN - 1 VIEW COMPARISON:  CT from earlier the same day FINDINGS: Gastric tube has been advanced into the stomach. Dilated small bowel loops in the mid abdomen as before. Lumbosacral fixation hardware noted. IMPRESSION: Gastric tube to the stomach Electronically Signed   By: Lucrezia Europe M.D.   On: 01/07/2022 17:39   CT ABDOMEN PELVIS WO CONTRAST  Result Date: 01/07/2022 CLINICAL DATA:  Abdominal pain, nausea, vomiting, diarrhea x5 days EXAM: CT ABDOMEN AND PELVIS WITHOUT CONTRAST TECHNIQUE: Multidetector CT imaging of the abdomen and pelvis was performed following the standard protocol without IV contrast. RADIATION DOSE REDUCTION: This exam was performed according to the departmental dose-optimization program which includes automated exposure control, adjustment of the mA and/or kV according to patient size and/or use of iterative reconstruction technique. COMPARISON:  01/04/2022 FINDINGS: Lower chest: Small patchy infiltrates are seen lower lung fields, more so in the right middle lobe. There is mild ectasia of bronchi. There is minimal right pleural effusion. Hepatobiliary: No focal abnormalities  are seen in liver. There is no dilation of bile ducts. Surgical clips are seen in gallbladder fossa. Pancreas: There is atrophy.  No focal abnormalities are seen. Spleen: Unremarkable. Adrenals/Urinary Tract: Adrenals are unremarkable. There is no hydronephrosis. There are no renal or ureteral stones. Urinary bladder is not  distended. High density in the lumen of the bladder may suggest residual contrast from previous CT. Stomach/Bowel: There is moderate to large fixed hiatal hernia. There is distention of stomach with fluid. There is fluid in the lower thoracic esophagus. There is interval worsening of proximal measuring up to 4.4 cm in diameter. Distal small bowel loops are decompressed. Exact level of transition is difficult to identify. It is possibly in right lower quadrant or right side of pelvis. Colon is not distended. Vascular/Lymphatic: Scattered arterial calcifications are seen. Reproductive: Uterus is not seen. Other: Small amount of ascites is present. There is no pneumoperitoneum. Musculoskeletal: There is lumbar fusion at L4-L5 and L5-S1 levels. IMPRESSION: There is abnormal dilation of proximal small bowel loops with decompression of distal small bowel loops consistent with high-grade partial small bowel obstruction, possibly in right lower quadrant or right pelvic cavity. This may be caused by adhesions or internal hernia or neoplastic process. There is significant interval worsening of small bowel dilation since 01/04/2022. There is small ascites. There is no pneumoperitoneum. Moderate to large fixed hiatal hernia. There is fluid in the lumen of stomach within the hiatal hernia and fluid in the lumen of lower thoracic esophagus, most likely due to high-grade small bowel obstruction. There is no hydronephrosis. There are patchy infiltrates in lower lung fields, more so in right middle lobe suggesting atelectasis/pneumonia. There is minimal right pleural effusion. Electronically Signed   By: Elmer Picker M.D.   On: 01/07/2022 16:02    EKG: Independently reviewed. Normal sinus rhythm Left bundle branch block Abnormal ECG When compared with ECG of 12/05/2021 heart rate has increased   Assessment/Plan Principal Problem:   Partial small bowel obstruction (HCC) Active Problems:   COPD (chronic obstructive pulmonary disease) (HCC)   Hyperlipidemia   Hypothyroidism (acquired)   Essential hypertension   History of pulmonary embolus (PE)    1.  Partial small bowel obstruction: -We will keep patient n.p.o.  -We will hydrate patient with normal saline with 40 mEq of potassium at 100 mL/h for 48 hours -Place NG tube to low wall suction -Await surgical consultation-Dr. Doren Custard in the emergency department has spoken with Dr. Arnoldo Morale. -Zofran IV or sublingually for nausea  2.  COPD: Currently stable and well compensated.  Continue home medications once they are reconciled.  3.  Hyperlipidemia: Patient manages her hyperlipidemia with diet control.  4.  Hypothyroidism: Continue home medications once it is reconciled.  5.  Essential hypertension: Continue home medications once reconciled.  6.  History of pulmonary embolism: Noted.  Not currently anticoagulated as this was in the past.   DVT prophylaxis: Subcu heparin Code Status: Full code Family Communication: Spoke with patient's son who was present at admission Disposition Plan: Likely home Consults called: General surgery Admission status: Inpatient   Lady Deutscher MD FACP [facility] Pager [phone]  How to contact the [facility] Attending or Consulting provider [place] or covering provider during after hours [place], for this patient?  Check the care team in [facility] and look for a) attending/consulting [facility] provider listed and b) the [facility] team listed Log into [email or web address] and use [place]'s universal password to access. If you do not have the password, please contact the hospital operator. Locate the [facility] provider  you are looking for under [facility] and page to a number that you can be directly reached. If you still have difficulty reaching the provider, please page the [facility] (Director on Call) for the Hospitalists listed  on amion for assistance.  If 7PM-7AM, please contact night-coverage [email or web address] Password [facility]  01/07/2022, 6:23 PM

## 2022-01-07 NOTE — ED Provider Notes (Signed)
[facility] Provider Note   CSN: [id number] Arrival date & time: 01/07/22  1102     History {Add pertinent medical, surgical, social history, OB history to HPI:1} Chief Complaint  Patient presents with   Abdominal Pain    [name] is a 76 y.o. female.  Patient complains of diarrhea and vomiting since Thursday.  She has a history of hypertension and COPD.  No blood in her diarrhea or vomit  The history is provided by the patient and medical records. No language interpreter was used.  Abdominal Pain Pain location:  Generalized Pain quality: aching   Pain radiates to:  Does not radiate Pain severity:  Moderate Onset quality:  Sudden Timing:  Constant Progression:  Waxing and waning Chronicity:  New Context: not alcohol use   Relieved by:  Nothing Associated symptoms: no chest pain, no cough, no diarrhea, no fatigue and no hematuria        Home Medications Prior to Admission medications   Medication Sig Start Date End Date Taking? Authorizing Provider  acetaminophen (TYLENOL) 650 MG CR tablet Take 650 mg by mouth every 8 (eight) hours as needed for pain.    [provider]  ALPRAZolam Duanne Moron) 0.5 MG tablet Take 1 tablet (0.5 mg total) by mouth 2 (two) times daily as needed for anxiety (use SPARINGLY). 10/30/21   Hassell Done, Mary-Margaret, FNP  calcium carbonate (OS-CAL - DOSED IN MG OF ELEMENTAL CALCIUM) 1250 (500 Ca) MG tablet Take 1 tablet by mouth once a week.     [provider]  Cholecalciferol (VITAMIN D3) 10 MCG (400 UNIT) CAPS Take 1 capsule by mouth daily.    [provider]  escitalopram (LEXAPRO) 10 MG tablet Take 1 tablet (10 mg total) by mouth daily. 10/30/21   Chevis Pretty, FNP  furosemide (LASIX) 20 MG tablet Take 1 tablet (20 mg total) by mouth every Monday, Wednesday, and Friday. May take additional 20 mg for swelling 12/05/21 05/22/22  Arnoldo Lenis, MD  levothyroxine (SYNTHROID) 50 MCG tablet TAKE ONE  (1) TABLET EACH DAY 10/30/21   Hassell Done, Mary-Margaret, FNP  Multiple Vitamin (MULTIVITAMIN) tablet Take 1 tablet by mouth 2 (two) times a week.     [provider]  omeprazole-sodium bicarbonate (ZEGERID) 40-1100 MG capsule Take 1 capsule by mouth daily before breakfast. 10/30/21   Hassell Done, Mary-Margaret, FNP  ondansetron (ZOFRAN-ODT) 4 MG disintegrating tablet Take 1 tablet (4 mg total) by mouth every 8 (eight) hours as needed for nausea or vomiting. 01/04/22   Fredia Sorrow, MD  oxyCODONE-acetaminophen (PERCOCET) 5-325 MG tablet Take 1 tablet every 6 hours by oral route as needed for 5 days. 12/31/19   [provider]  predniSONE (DELTASONE) 5 MG tablet  12/23/19   [provider]  telmisartan (MICARDIS) 80 MG tablet Take 1 tablet (80 mg total) by mouth daily. 09/28/21   Hassell Done Mary-Margaret, FNP      Allergies    Celebrex [celecoxib], Keflet [cephalexin], Penicillins, Sulfa antibiotics, Kenalog [triamcinolone acetonide], Latex, Lisinopril, and Mobic [meloxicam]    Review of Systems   Review of Systems  Constitutional:  Negative for appetite change and fatigue.  HENT:  Negative for congestion, ear discharge and sinus pressure.   Eyes:  Negative for discharge.  Respiratory:  Negative for cough.   Cardiovascular:  Negative for chest pain.  Gastrointestinal:  Positive for abdominal pain. Negative for diarrhea.  Genitourinary:  Negative for frequency and hematuria.  Musculoskeletal:  Negative for back pain.  Skin:  Negative  for rash.  Neurological:  Negative for seizures and headaches.  Psychiatric/Behavioral:  Negative for hallucinations.     Physical Exam Updated Vital Signs BP 123/78   Pulse 82   Temp 100.2 F (37.9 C) (Oral)   Resp (!) 25   Ht '5\' 4"'$  (1.626 m)   Wt 78.7 kg   SpO2 93%   BMI 29.78 kg/m  Physical Exam Vitals and nursing note reviewed.  Constitutional:      Appearance: She is well-developed.  HENT:     Head: Normocephalic.      Mouth/Throat:     Mouth: Mucous membranes are moist.  Eyes:     General: No scleral icterus.    Conjunctiva/sclera: Conjunctivae normal.  Neck:     Thyroid: No thyromegaly.  Cardiovascular:     Rate and Rhythm: Normal rate and regular rhythm.     Heart sounds: No murmur heard.    No friction rub. No gallop.  Pulmonary:     Breath sounds: No stridor. No wheezing or rales.  Chest:     Chest wall: No tenderness.  Abdominal:     General: There is no distension.     Tenderness: There is no abdominal tenderness. There is no rebound.  Musculoskeletal:        General: Normal range of motion.     Cervical back: Neck supple.  Lymphadenopathy:     Cervical: No cervical adenopathy.  Skin:    Findings: No erythema or rash.  Neurological:     Mental Status: She is alert and oriented to person, place, and time.     Motor: No abnormal muscle tone.     Coordination: Coordination normal.  Psychiatric:        Behavior: Behavior normal.     ED Results / Procedures / Treatments   Labs (all labs ordered are listed, but only abnormal results are displayed) Labs Reviewed  COMPREHENSIVE METABOLIC PANEL - Abnormal; Notable for the following components:      Result Value   Potassium 2.7 (*)    Chloride 96 (*)    Glucose, Bld 147 (*)    BUN 42 (*)    Creatinine, Ser 1.84 (*)    GFR, Estimated 28 (*)    All other components within normal limits  CBC - Abnormal; Notable for the following components:   Hemoglobin 11.5 (*)    RDW 16.7 (*)    All other components within normal limits  URINE CULTURE  GASTROINTESTINAL PANEL BY PCR, STOOL (REPLACES STOOL CULTURE)  LIPASE, BLOOD  URINALYSIS, ROUTINE W REFLEX MICROSCOPIC    EKG None  Radiology No results found.  Procedures Procedures  {Document cardiac monitor, telemetry assessment procedure when appropriate:1}  Medications Ordered in ED Medications  potassium chloride 10 mEq in 100 mL IVPB (has no administration in time range)   potassium chloride 10 mEq in 100 mL IVPB (10 mEq Intravenous New Bag/Given 01/07/22 1434)  ondansetron (ZOFRAN-ODT) disintegrating tablet 4 mg (4 mg Oral Given 01/07/22 1145)  sodium chloride 0.9 % bolus 1,000 mL (1,000 mLs Intravenous New Bag/Given 01/07/22 1421)    ED Course/ Medical Decision Making/ A&P                           Medical Decision Making Amount and/or Complexity of Data Reviewed Labs: ordered. Radiology: ordered.  Risk Prescription drug management.   Patient with hypokalemia and an AKI.  CT abdomen pending.  She will be admitted to  medicine  {Document critical care time when appropriate:1} {Document review of labs and clinical decision tools ie heart score, Chads2Vasc2 etc:1}  {Document your independent review of radiology images, and any outside records:1} {Document your discussion with family members, caretakers, and with consultants:1} {Document social determinants of health affecting pt's care:1} {Document your decision making why or why not admission, treatments were needed:1} Final Clinical Impression(s) / ED Diagnoses Final diagnoses:  None    Rx / DC Orders ED Discharge Orders     None

## 2022-01-07 NOTE — ED Provider Notes (Signed)
Care of patient assumed from Dr. Roderic Palau at 3:30 PM.  This patient is getting admitted for AKI and hypokalemia secondary to nausea, vomiting, and diarrhea.  She is currently awaiting a CT scan.  Notify Dr. Evangeline Gula once CT results are in. Physical Exam  BP 123/78   Pulse 82   Temp 100.2 F (37.9 C) (Oral)   Resp (!) 25   Ht '5\' 4"'$  (1.626 m)   Wt 78.7 kg   SpO2 93%   BMI 29.78 kg/m   Physical Exam Vitals and nursing note reviewed.  Constitutional:      General: She is not in acute distress.    Appearance: She is well-developed. She is not ill-appearing, toxic-appearing or diaphoretic.  HENT:     Head: Normocephalic and atraumatic.     Mouth/Throat:     Mouth: Mucous membranes are moist.     Pharynx: Oropharynx is clear.  Eyes:     Conjunctiva/sclera: Conjunctivae normal.  Cardiovascular:     Rate and Rhythm: Normal rate and regular rhythm.  Pulmonary:     Effort: Pulmonary effort is normal. No respiratory distress.  Abdominal:     General: There is no distension.     Palpations: Abdomen is soft.  Musculoskeletal:        General: No swelling.     Cervical back: Neck supple.  Skin:    General: Skin is warm and dry.     Capillary Refill: Capillary refill takes less than 2 seconds.  Neurological:     Mental Status: She is alert and oriented to person, place, and time.  Psychiatric:        Mood and Affect: Mood normal.        Behavior: Behavior normal.     Procedures  Procedures  ED Course / MDM    Medical Decision Making Amount and/or Complexity of Data Reviewed Labs: ordered. Radiology: ordered.  Risk Prescription drug management. Decision regarding hospitalization.   CT scan shows partial SBO.  This was discussed with admitting hospitalist, Dr. Evangeline Gula.  I also discussed with general surgeon, Dr. Arnoldo Morale who recommends NG tube and he will likely see her in the morning.  Patient was admitted for further management.       Godfrey Pick, MD 01/09/22 [phone]

## 2022-01-07 NOTE — ED Provider Triage Note (Signed)
Emergency Medicine Provider Triage Evaluation Note  [name] , a 76 y.o. female  was evaluated in triage.  Pt complains of generalized abdominal pain, nausea, vomiting, and diarrhea since Thursday.  Patient states she was seen at [facility] on Friday and was diagnosed with a "virus".  Quick chart review shows she was diagnosed with gastroenteritis.  CT abdomen pelvis was done at that visit.  Patient states she has not gotten better.  Denies shortness of breath, chest pain  Review of Systems  Positive: As above Negative: As above  Physical Exam  BP 109/67 (BP Location: Left Arm)   Pulse 82   Temp 100.2 F (37.9 C) (Oral)   Resp 17   Ht '5\' 4"'$  (1.626 m)   Wt 78.7 kg   SpO2 93%   BMI 29.78 kg/m  Gen:   Awake, no distress   Resp:  Normal effort  MSK:   Moves extremities without difficulty  Other:    Medical Decision Making  Medically screening exam initiated at 12:26 PM.  Appropriate orders placed.  [name] was informed that the remainder of the evaluation will be completed by another provider, this initial triage assessment does not replace that evaluation, and the importance of remaining in the ED until their evaluation is complete.     Dorothyann Peng, PA-C 01/07/22 1227

## 2022-01-08 ENCOUNTER — Inpatient Hospital Stay (HOSPITAL_COMMUNITY): Payer: Medicare Other

## 2022-01-08 DIAGNOSIS — R140 Abdominal distension (gaseous): Secondary | ICD-10-CM | POA: Diagnosis not present

## 2022-01-08 DIAGNOSIS — Z4682 Encounter for fitting and adjustment of non-vascular catheter: Secondary | ICD-10-CM | POA: Diagnosis not present

## 2022-01-08 DIAGNOSIS — R112 Nausea with vomiting, unspecified: Secondary | ICD-10-CM | POA: Diagnosis not present

## 2022-01-08 DIAGNOSIS — K56600 Partial intestinal obstruction, unspecified as to cause: Secondary | ICD-10-CM | POA: Diagnosis not present

## 2022-01-08 DIAGNOSIS — R109 Unspecified abdominal pain: Secondary | ICD-10-CM | POA: Diagnosis not present

## 2022-01-08 LAB — CBC
HCT: 36.2 % (ref 36.0–46.0)
Hemoglobin: 11.7 g/dL — ABNORMAL LOW (ref 12.0–15.0)
MCH: 28.5 pg (ref 26.0–34.0)
MCHC: 32.3 g/dL (ref 30.0–36.0)
MCV: 88.3 fL (ref 80.0–100.0)
Platelets: 296 10*3/uL (ref 150–400)
RBC: 4.1 MIL/uL (ref 3.87–5.11)
RDW: 16.9 % — ABNORMAL HIGH (ref 11.5–15.5)
WBC: 13.1 10*3/uL — ABNORMAL HIGH (ref 4.0–10.5)
nRBC: 0 % (ref 0.0–0.2)

## 2022-01-08 LAB — BASIC METABOLIC PANEL
Anion gap: 13 (ref 5–15)
Anion gap: 15 (ref 5–15)
BUN: 53 mg/dL — ABNORMAL HIGH (ref 8–23)
BUN: 62 mg/dL — ABNORMAL HIGH (ref 8–23)
CO2: 31 mmol/L (ref 22–32)
CO2: 29 mmol/L (ref 22–32)
Calcium: 8.1 mg/dL — ABNORMAL LOW (ref 8.9–10.3)
Calcium: 7.7 mg/dL — ABNORMAL LOW (ref 8.9–10.3)
Chloride: 101 mmol/L (ref 98–111)
Chloride: 99 mmol/L (ref 98–111)
Creatinine, Ser: 2.17 mg/dL — ABNORMAL HIGH (ref 0.44–1.00)
Creatinine, Ser: 2.83 mg/dL — ABNORMAL HIGH (ref 0.44–1.00)
GFR, Estimated: 23 mL/min — ABNORMAL LOW (ref >60)
GFR, Estimated: 17 mL/min — ABNORMAL LOW (ref >60)
Glucose, Bld: 105 mg/dL — ABNORMAL HIGH (ref 70–99)
Glucose, Bld: 104 mg/dL — ABNORMAL HIGH (ref 70–99)
Potassium: 2.7 mmol/L — CL (ref 3.5–5.1)
Potassium: 3.1 mmol/L — ABNORMAL LOW (ref 3.5–5.1)
Sodium: 145 mmol/L (ref 135–145)
Sodium: 143 mmol/L (ref 135–145)

## 2022-01-08 LAB — MAGNESIUM: Magnesium: 2.4 mg/dL (ref 1.7–2.4)

## 2022-01-08 LAB — PHOSPHORUS: Phosphorus: 4.8 mg/dL — ABNORMAL HIGH (ref 2.5–4.6)

## 2022-01-08 MED ORDER — MAGNESIUM SULFATE 2 GM/50ML IV SOLN
2.0000 g | Freq: Once | INTRAVENOUS | Status: AC
Start: 2022-01-08 — End: 2022-01-08
  Administered 2022-01-08: 2 g via INTRAVENOUS
  Filled 2022-01-08: qty 50

## 2022-01-08 MED ORDER — PANTOPRAZOLE SODIUM 40 MG IV SOLR
40.0000 mg | Freq: Two times a day (BID) | INTRAVENOUS | Status: DC
  Administered 2022-01-08 – 2022-01-09 (×3): 40 mg via INTRAVENOUS
  Filled 2022-01-08 (×3): qty 10

## 2022-01-08 MED ORDER — LEVOFLOXACIN IN D5W 250 MG/50ML IV SOLN
250.0000 mg | INTRAVENOUS | Status: DC
Start: 2022-01-08 — End: 2022-01-08
  Filled 2022-01-08: qty 50

## 2022-01-08 MED ORDER — POTASSIUM CHLORIDE 10 MEQ/100ML IV SOLN
10.0000 meq | INTRAVENOUS | Status: AC
  Administered 2022-01-08 (×2): 10 meq via INTRAVENOUS
  Filled 2022-01-08 (×2): qty 100

## 2022-01-08 MED ORDER — ALUM & MAG HYDROXIDE-SIMETH 200-200-20 MG/5ML PO SUSP: 30.0000 mL | ORAL | Status: DC | PRN

## 2022-01-08 MED ORDER — LORAZEPAM 2 MG/ML IJ SOLN
0.5000 mg | Freq: Once | INTRAMUSCULAR | Status: AC
  Administered 2022-01-08: 0.5 mg via INTRAVENOUS
  Filled 2022-01-08: qty 1

## 2022-01-08 MED ORDER — LEVOFLOXACIN IN D5W 250 MG/50ML IV SOLN
250.0000 mg | INTRAVENOUS | Status: DC
  Administered 2022-01-08 – 2022-01-09 (×2): 250 mg via INTRAVENOUS
  Filled 2022-01-08 (×3): qty 50

## 2022-01-08 MED ORDER — LEVOFLOXACIN IN D5W 250 MG/50ML IV SOLN
250.0000 mg | INTRAVENOUS | Status: DC
  Filled 2022-01-08: qty 50

## 2022-01-08 NOTE — Hospital Course (Addendum)
40 yowf never smoker with "copd"(not supported by pfts)   hyperlipidemia, essential hypertension, hypothyroidism, pulmonary embolism in the past, osteoarthritis, and depression who presents from home with vomiting and diarrhea since 8/11 when  presented with  acute onset of abdominal pain CT scan was obtained and was unremarkable she received some IV fluids and antiemetics and was discharged home.  Her V and D persisted and when  previous treatment did not alleviate her symptoms   she presented to [facility] with    CT scan was obtained and found to show a partial small bowel obstruction and significant changes compared to the CT scan she had on 8/11      ED Course:  Patient was given an oral dose of Zofran and some IV fluid drip.  She continued to have persistent nausea in the emergency department and vomited at least 200 mL  witnessed  She was given additional Zofran at that point in her IV.  Also had 1 episode of diarrhea while here in the emergency department which is described as small vol. Labs were consistent with acute kidney injury and hypokalemia CT scan showeda partial small bowel obstruction.      CT scan was obtained and found to show a partial small bowel obstruction and significant changes compared to the CT scan she had on Friday.   To OR 68/17 Exploratory laparotomy lysis of adhesion and remained on vent/ on pressors post op and PCCM service consulted      Assessment/Plan Principal Problem:   Partial small bowel obstruction (HCC) Active Problems:   COPD (chronic obstructive pulmonary disease) (HCC)   Hyperlipidemia   Hypothyroidism (acquired)   Essential hypertension   History of pulmonary embolus (PE)   Septic shock -Likely due to ischemic bowel,..  Night prior to surgery on 01/10/2022 patient became hypotensive with marked leukocytosis and acute renal failure -Exacerbated by ischemic small bowel obstruction -Status post full code laparotomy lysis of adhesion on  01/10/2022 -Status post aggressive IV fluid resuscitation with LR -Still meeting sepsis, septic shock criteria due to hypotension, marked leukocytosis, acute renal failure, acute respiratory failure -Focus sepsis protocol was initiated -Continue Levophed, continue patient intubated and on the vent -Critical care team following  -Initial antibiotics Levaquin has been broadened based on sepsis protocol -Remained hypotensive on vasopressors at 10 mcg of Levophed, on the vent for acute respiratory failure postop -Repeat ABG chest x-ray per PCCM  Acute respiratory failure -Remain intubated  -Postop 01/10/2022 s/p exploratory laparotomy lysis of adhesion Remain intubated... On the vent ABG/vent management per PCCM Dr. Melvyn Novas following closely -Comfortable on the vent, no issues overnight  Patient will be [facility] ICU  PCCM and nephrology following closely possible need for CRRT   Small bowel obstruction: Postop day  #2  01/10/2022 status post urgent exploratory laparotomy by Dr. Constance Haw Lysis of adhesions, relieving small bowel obstruction  -Subsequent postop patient met sepsis, septic shock criteria due to hypotension, acute respiratory failure, acute renal failure    Acute renal insufficiency versus ATN -Likely exacerbated due to dehydration, SBO, hypertension (? CKD -creatinine 1.09 on 01/04/2022)  -Worsening creatinine and kidney function -Progressive renal failure in the setting high-grade small bowel obstruction, possible ischemic bowel... No signs of renal artery obstructions UA possible UTI  Lab Results  Component Value Date   CREATININE 5.32 (H) 01/12/2022   CREATININE 5.14 (H) 01/11/2022   CREATININE 4.79 (H) 01/11/2022     BUN 53, 62, 75>>> 94, 91 -Monitoring, avoiding nephrotoxins --Consult nephrology:  Appreciate input and close follow-up -Looks like the patient may need CRRT Pending transfer to [facility] today  COPD:  -On the vent -As needed DuoNeb  bronchodilators   Hyperlipidemia: Patient manages her hyperlipidemia with diet control.   Hypothyroidism: Continue home dose Synthroid   Hypotensive -On pressor, Levophed at 10 mcg/h  Hypotensive/history of hypertension -Hypotensive, holding home BP meds -  as needed hydralazine. -On Levophed drip --titrating to MAP of 65  Remote history of pulmonary embolism: Noted.  Not currently anticoagulated as this was in the past.     OR  8/17  Exploratory laparotomy lysis of adhesion  ET   8/17    >>> R I J   8/17 >>>

## 2022-01-08 NOTE — TOC Progression Note (Signed)
  Transition of Care (TOC) Screening Note   Patient Details  Name: [name] Date of Birth: 03-Jun-1945   Transition of Care [facility] CM/SW Contact:    Shade Flood, LCSW Phone Number: 01/08/2022, 10:16 AM    Transition of Care Department [facility]) has reviewed patient and no TOC needs have been identified at this time. We will continue to monitor patient advancement through interdisciplinary progression rounds. If new patient transition needs arise, please place a TOC consult.

## 2022-01-08 NOTE — Progress Notes (Signed)
PROGRESS NOTE    Patient: [name]                            PCP: Chevis Pretty, FNP                    DOB: [birth year]            DOA: 01/07/2022 ZOX:[id number]             DOS: 01/08/2022, 10:56 AM   LOS: 1 day   Date of Service: The patient was seen and examined on 01/08/2022  Subjective:   The patient was seen and examined this morning. Hemodynamically stable, alert and oriented x3 Still complaining of Abdominal discomfort   Brief Narrative:   [name] is a 76 y.o. female with medical history significant of 76 year old female past medical history significant for chronic obstructive pulmonary disease, hyperlipidemia, essential hypertension, hypothyroidism, pulmonary embolism in the past, osteoarthritis, and depression who presents from home with vomiting and diarrhea since Friday.   Presented to urgent care on Friday with acute onset of abdominal pain a CT scan was obtained and was unremarkable she received some IV fluids and antiemetics and was discharged home.  Her V and D persisted and that previous treatment did not alleviate her symptoms so she presented here with her son today.  He provided some information which was not available on Friday.  Apparently the patient had eaten some fruit that she had kept outside on a porch but some wild animal had gotten into the fruit she cut away the parts that had been nibbled on and ate the fruit on Thursday.  She has been sick since then.  ED:  CT scan was obtained and found to show a partial small bowel obstruction and significant changes compared to the CT scan she had on Friday.   ED Course: Patient was given an oral dose of Zofran and some IV fluid drip.  She continued to have persistent nausea in the emergency department and vomited at least 200 mL during my exam.  She was given additional Zofran at that point in her IV.  Also had 1 episode of diarrhea while here in the emergency department which she describes as small. Labs  are consistent with acute kidney injury and hypokalemia CT scan shows a partial small bowel obstruction.  I discussed the case with Dr. Doren Custard who has ordered an NG tube and communicated with surgery who will see the patient in a.m.   Assessment/Plan Principal Problem:   Partial small bowel obstruction (HCC) Active Problems:   COPD (chronic obstructive pulmonary disease) (HCC)   Hyperlipidemia   Hypothyroidism (acquired)   Essential hypertension   History of pulmonary embolus (PE)    Partial small bowel obstruction: -Patient will be monitored closely -NG-tube in place,--apparently has had a bowel movement, most loose but not watery -C. difficile ordered will be discontinued -Patient will be kept n.p.o. -Continue with gentle IV fluid hydration -Potassium will be repleted IV  -Status post evaluation by general surgery Dr. Babs Sciara to pursue medical, conservative management  -Zofran IV or sublingually for nausea   COPD:  -Stable on room air -Currently stable and well compensated.   -As needed DuoNeb bronchodilators   Hyperlipidemia: Patient manages her hyperlipidemia with diet control.   Hypothyroidism: Continue home medications once can tolerate p.o.   Essential hypertension: Continue home medications once n.p.o., for now as needed hydralazine.  Remote history of  pulmonary embolism: Noted.  Not currently anticoagulated as this was in the past.     ----------------------------------------------------------------------------------------------------------------------------------------------- Nutritional status:  The patient's BMI is: Body mass index is 29.78 kg/m. I agree with the assessment and plan as outlined below: Nutrition Status:         ------------------------------------------------------------------------------------------------------------------------------------------------  DVT prophylaxis:  heparin injection 5,000 Units Start: 01/07/22  2200   Code Status:   Code Status: Full Code  Family Communication: Son present at bedside-updated The above findings and plan of care has been discussed with patient (and family)  in detail,  they expressed understanding and agreement of above. -Advance care planning has been discussed.   Admission status:   Status is: Inpatient Remains inpatient appropriate because: Need to be n.p.o. IV fluids, surgical evaluation     Procedures:   No admission procedures for hospital encounter.   Antimicrobials:  Anti-infectives (From admission, onward)    Start     Dose/Rate Route Frequency Ordered Stop   01/08/22 0830  Levofloxacin (LEVAQUIN) IVPB 250 mg  Status:  Discontinued        250 mg 50 mL/hr over 60 Minutes Intravenous Every 24 hours 01/08/22 0724 01/08/22 0727   01/08/22 0830  Levofloxacin (LEVAQUIN) IVPB 250 mg  Status:  Discontinued        250 mg 50 mL/hr over 60 Minutes Intravenous Every 48 hours 01/08/22 0727 01/08/22 0732   01/08/22 0830  Levofloxacin (LEVAQUIN) IVPB 250 mg        250 mg 50 mL/hr over 60 Minutes Intravenous Every 24 hours 01/08/22 0732          Medication:   heparin  5,000 Units Subcutaneous Q8H   pantoprazole (PROTONIX) IV  40 mg Intravenous QHS   sodium chloride flush  3 mL Intravenous Q12H    sodium chloride, morphine injection, morphine injection, [DISCONTINUED] ondansetron **OR** ondansetron (ZOFRAN) IV, sodium chloride flush   Objective:   Vitals:   01/07/22 2006 01/07/22 2108 01/08/22 0020 01/08/22 0501  BP: (!) 140/66 (!) 121/57 112/64 (!) 94/57  Pulse: 85 77 86 90  Resp: '20 17 18 16  '$ Temp: 98 F (36.7 C) 98.2 F (36.8 C) 98.9 F (37.2 C) 98.4 F (36.9 C)  TempSrc: Oral  Oral Oral  SpO2: 98% 94% 92% 93%  Weight:  78.7 kg    Height:        Intake/Output Summary (Last 24 hours) at 01/08/2022 1056 Last data filed at 01/08/2022 0600 Gross per 24 hour  Intake 635.53 ml  Output 2200 ml  Net -1564.47 ml   Filed Weights    01/07/22 1138 01/07/22 2108  Weight: 78.7 kg 78.7 kg     Examination:   Physical Exam  Constitution:  Alert, cooperative, no distress,  Appears calm and comfortable  Psychiatric:   Normal and stable mood and affect, cognition intact,   HEENT:        Normocephalic, PERRL, otherwise with in Normal limits  Chest:         Chest symmetric Cardio vascular:  S1/S2, RRR, No murmure, No Rubs or Gallops  pulmonary: Clear to auscultation bilaterally, respirations unlabored, negative wheezes / crackles Abdomen: Soft, non-tender, non-distended, hypoactive bowel sounds,no masses, no organomegaly Muscular skeletal: Limited exam - in bed, able to move all 4 extremities,   Neuro: CNII-XII intact. , normal motor and sensation, reflexes intact  Extremities: No pitting edema lower extremities, +2 pulses  Skin: Dry, warm to touch, negative for any Rashes, No open wounds Wounds: per nursing  documentation   ------------------------------------------------------------------------------------------------------------------------------------------    LABs:     Latest Ref Rng & Units 01/08/2022    4:33 AM 01/07/2022    8:55 PM 01/07/2022   12:27 PM  CBC  WBC 4.0 - 10.5 K/uL 13.1  13.9  9.8   Hemoglobin 12.0 - 15.0 g/dL 11.7  11.5  11.5   Hematocrit 36.0 - 46.0 % 36.2  35.6  36.0   Platelets 150 - 400 K/uL 296  316  350       Latest Ref Rng & Units 01/08/2022    4:33 AM 01/07/2022    8:55 PM 01/07/2022   12:27 PM  CMP  Glucose 70 - 99 mg/dL 105   147   BUN 8 - 23 mg/dL 53   42   Creatinine 0.44 - 1.00 mg/dL 2.17  1.82  1.84   Sodium 135 - 145 mmol/L 145   139   Potassium 3.5 - 5.1 mmol/L 2.7   2.7   Chloride 98 - 111 mmol/L 101   96   CO2 22 - 32 mmol/L 31   31   Calcium 8.9 - 10.3 mg/dL 8.1   9.2   Total Protein 6.5 - 8.1 g/dL   7.5   Total Bilirubin 0.3 - 1.2 mg/dL   1.2   Alkaline Phos 38 - 126 U/L   97   AST 15 - 41 U/L   29   ALT 0 - 44 U/L   17        Micro Results No results found  for this or any previous visit (from the past 240 hour(s)).  Radiology Reports Acute Abdominal Series  Result Date: 01/08/2022 CLINICAL DATA: Nausea vomiting with abdominal pain. EXAM: DG ABDOMEN ACUTE WITH 1 VIEW CHEST COMPARISON:  CT scan 01/07/2022 FINDINGS: Low lung volumes. The cardio pericardial silhouette is enlarged. Interstitial markings are diffusely coarsened with chronic features. Nodular and patchy opacities identified at the right base with associated retrocardiac atelectasis or infiltrate. NG tube tip is in the distal stomach. Diffuse gaseous distention of small bowel persists but appears decreased when comparing to scout image from yesterday's CT scan. Lumbar fusion hardware evident. Surgical clips in the right upper quadrant suggest prior cholecystectomy. IMPRESSION: 1. NG tube tip is in the distal stomach. 2. Diffuse gaseous distention of small bowel persists but appears decreased since yesterday's CT scan. 3. Nodular and patchy opacities at the right base with associated retrocardiac atelectasis or infiltrate. Given nodular appearance of the disease in the right base, CT chest recommended to exclude mass lesion. Electronically Signed   By: Misty Stanley M.D.   On: 01/08/2022 06:59   DG Abdomen 1 View  Result Date: 01/07/2022 CLINICAL DATA:  check NG tube placement EXAM: ABDOMEN - 1 VIEW COMPARISON:  CT from earlier the same day FINDINGS: Gastric tube has been advanced into the stomach. Dilated small bowel loops in the mid abdomen as before. Lumbosacral fixation hardware noted. IMPRESSION: Gastric tube to the stomach Electronically Signed   By: Lucrezia Europe M.D.   On: 01/07/2022 17:39   CT ABDOMEN PELVIS WO CONTRAST  Result Date: 01/07/2022 CLINICAL DATA:  Abdominal pain, nausea, vomiting, diarrhea x5 days EXAM: CT ABDOMEN AND PELVIS WITHOUT CONTRAST TECHNIQUE: Multidetector CT imaging of the abdomen and pelvis was performed following the standard protocol without IV contrast. RADIATION  DOSE REDUCTION: This exam was performed according to the departmental dose-optimization program which includes automated exposure control, adjustment of the mA and/or kV according to patient  size and/or use of iterative reconstruction technique. COMPARISON:  01/04/2022 FINDINGS: Lower chest: Small patchy infiltrates are seen lower lung fields, more so in the right middle lobe. There is mild ectasia of bronchi. There is minimal right pleural effusion. Hepatobiliary: No focal abnormalities are seen in liver. There is no dilation of bile ducts. Surgical clips are seen in gallbladder fossa. Pancreas: There is atrophy.  No focal abnormalities are seen. Spleen: Unremarkable. Adrenals/Urinary Tract: Adrenals are unremarkable. There is no hydronephrosis. There are no renal or ureteral stones. Urinary bladder is not distended. High density in the lumen of the bladder may suggest residual contrast from previous CT. Stomach/Bowel: There is moderate to large fixed hiatal hernia. There is distention of stomach with fluid. There is fluid in the lower thoracic esophagus. There is interval worsening of proximal measuring up to 4.4 cm in diameter. Distal small bowel loops are decompressed. Exact level of transition is difficult to identify. It is possibly in right lower quadrant or right side of pelvis. Colon is not distended. Vascular/Lymphatic: Scattered arterial calcifications are seen. Reproductive: Uterus is not seen. Other: Small amount of ascites is present. There is no pneumoperitoneum. Musculoskeletal: There is lumbar fusion at L4-L5 and L5-S1 levels. IMPRESSION: There is abnormal dilation of proximal small bowel loops with decompression of distal small bowel loops consistent with high-grade partial small bowel obstruction, possibly in right lower quadrant or right pelvic cavity. This may be caused by adhesions or internal hernia or neoplastic process. There is significant interval worsening of small bowel dilation since  01/04/2022. There is small ascites. There is no pneumoperitoneum. Moderate to large fixed hiatal hernia. There is fluid in the lumen of stomach within the hiatal hernia and fluid in the lumen of lower thoracic esophagus, most likely due to high-grade small bowel obstruction. There is no hydronephrosis. There are patchy infiltrates in lower lung fields, more so in right middle lobe suggesting atelectasis/pneumonia. There is minimal right pleural effusion. Electronically Signed   By: Elmer Picker M.D.   On: 01/07/2022 16:02    SIGNED: Deatra James, MD, FHM. [facility],  Pager (please use [email or web address] to page/text) Please use Epic Secure Chat for non-urgent communication (7AM-7PM)  If 7PM-7AM, please contact night-coverage [email or web address], 01/08/2022, 10:56 AM

## 2022-01-08 NOTE — Progress Notes (Signed)
Date and time results received: 01/08/22 0734 (use smartphrase ".now" to insert current time)  Test: Potassium Critical Value: 2.7  Name of Provider Notified: Shahmedhdi  Orders Received? Or Actions Taken?: No new orders at this time

## 2022-01-08 NOTE — Consult Note (Signed)
Reason for Consult: Partial small bowel obstruction Referring Physician: Dr. Ned Grace Kwasny is an 76 y.o. female.  HPI: Patient is a 76 year old white female with multiple comorbidities including COPD, hypertension, IBS, hypothyroidism, and history of pulmonary embolism who presented to the emergency room on 01/07/2022 with nausea, vomiting, and diarrhea.  Please see H&P.  Patient apparently ate some fruit that had been eaten by an unknown animal.  A CT scan of the abdomen was performed which revealed a partial small bowel obstruction due to some proximal dilatation of the small bowel possibly secondary to adhesive disease in the lower pelvis.  Patient was noted to be hypokalemic with mild renal insufficiency.  She was admitted to the hospital for further evaluation and treatment.  An NG tube was placed.  Patient reports having multiple episodes of diarrhea overnight.  She states she has abdominal cramping but no specific point tenderness.  She does not feel significantly bloated.  Past Medical History:  Diagnosis Date   Allergy    Bronchitis, chronic (HCC)    Chronic anxiety    Complication of anesthesia    hard to wake up   COPD (chronic obstructive pulmonary disease) (HCC)    Depression    Esophageal reflux    Headache(784.0)    Hyperlipidemia    Hypertension    Hypothyroidism    Obesity    Osteoporosis    Overactive bladder    PVC's (premature ventricular contractions)    Thyroid disease    Vitamin D deficiency disease     Past Surgical History:  Procedure Laterality Date   ABDOMINAL HYSTERECTOMY     CHOLECYSTECTOMY N/A 11/04/2013   Procedure: LAPAROSCOPIC CHOLECYSTECTOMY WITH INTRAOPERATIVE CHOLANGIOGRAM;  Surgeon: Imogene Burn. Georgette Dover, MD;  Location: [facility];  Service: General;  Laterality: N/A;   COLONOSCOPY     FRACTURE SURGERY     pins removed from Hip surgery   HIP FRACTURE SURGERY  1990    pins removed in [facility]      Family History  Problem Relation Age of Onset   Arthritis Other    Heart disease Other    Cancer Other    Diabetes Other    OCD Other     Social History:  reports that she has never smoked. She has never used smokeless tobacco. She reports that she does not drink alcohol and does not use drugs.  Allergies:  Allergies  Allergen Reactions   Celebrex [Celecoxib] Swelling   Keflet [Cephalexin] Swelling   Penicillins Hives and Swelling    DID THE REACTION INVOLVE: Swelling of the face/tongue/throat, SOB, or low BP? Yes-swelling-hives Sudden or severe rash/hives, skin peeling, or the inside of the mouth or nose? Unknown Did it require medical treatment? Unknown When did it last happen?    over 10 years   If all above answers are "NO", may proceed with cephalosporin use.    Sulfa Antibiotics Swelling   Kenalog [Triamcinolone Acetonide]     unknown   Latex    Lisinopril Other (See Comments)    unknown   Mobic [Meloxicam]     SWELLING    Medications: I have reviewed the patient's current medications.  Results for orders placed or performed during the hospital encounter of 01/07/22 (from the past 48 hour(s))  Urinalysis, Routine w reflex microscopic Urine, Clean Catch     Status: Abnormal   Collection Time: 01/07/22 11:42  AM  Result Value Ref Range   Color, Urine YELLOW YELLOW   APPearance TURBID (A) CLEAR   Specific Gravity, Urine 1.024 1.005 - 1.030   pH 5.0 5.0 - 8.0   Glucose, UA NEGATIVE NEGATIVE mg/dL   Hgb urine dipstick MODERATE (A) NEGATIVE   Bilirubin Urine NEGATIVE NEGATIVE   Ketones, ur NEGATIVE NEGATIVE mg/dL   Protein, ur 100 (A) NEGATIVE mg/dL   Nitrite NEGATIVE NEGATIVE   Leukocytes,Ua MODERATE (A) NEGATIVE   RBC / HPF 21-50 0 - 5 RBC/hpf   WBC, UA 21-50 0 - 5 WBC/hpf   Bacteria, UA MANY (A) NONE SEEN   Squamous Epithelial / LPF 11-20 0 - 5   WBC Clumps PRESENT    Mucus PRESENT    Amorphous Crystal PRESENT    Non  Squamous Epithelial 0-5 (A) NONE SEEN    Comment: Performed at [facility], [address] [place], [place] [address]  Lipase, blood     Status: None   Collection Time: 01/07/22 12:27 PM  Result Value Ref Range   Lipase 23 11 - 51 U/L    Comment: Performed at [facility], [address] [place], [place] [address]  Comprehensive metabolic panel     Status: Abnormal   Collection Time: 01/07/22 12:27 PM  Result Value Ref Range   Sodium 139 135 - 145 mmol/L   Potassium 2.7 (LL) 3.5 - 5.1 mmol/L    Comment: CRITICAL RESULT CALLED TO, READ BACK BY AND VERIFIED WITH: MELANIE CUGINO @ 1346 ON 01/07/22 C VARNER     Chloride 96 (L) 98 - 111 mmol/L   CO2 31 22 - 32 mmol/L   Glucose, Bld 147 (H) 70 - 99 mg/dL    Comment: Glucose reference range applies only to samples taken after fasting for at least 8 hours.   BUN 42 (H) 8 - 23 mg/dL   Creatinine, Ser 1.84 (H) 0.44 - 1.00 mg/dL   Calcium 9.2 8.9 - 10.3 mg/dL   Total Protein 7.5 6.5 - 8.1 g/dL   Albumin 4.0 3.5 - 5.0 g/dL   AST 29 15 - 41 U/L   ALT 17 0 - 44 U/L   Alkaline Phosphatase 97 38 - 126 U/L   Total Bilirubin 1.2 0.3 - 1.2 mg/dL   GFR, Estimated 28 (L) >60 mL/min    Comment: (NOTE) Calculated using the CKD-EPI Creatinine Equation (2021)    Anion gap 12 5 - 15    Comment: Performed at [facility], [address], [place], [place] [address]  CBC     Status: Abnormal   Collection Time: 01/07/22 12:27 PM  Result Value Ref Range   WBC 9.8 4.0 - 10.5 K/uL   RBC 4.14 3.87 - 5.11 MIL/uL   Hemoglobin 11.5 (L) 12.0 - 15.0 g/dL   HCT 36.0 36.0 - 46.0 %   MCV 87.0 80.0 - 100.0 fL   MCH 27.8 26.0 - 34.0 pg   MCHC 31.9 30.0 - 36.0 g/dL   RDW 16.7 (H) 11.5 - 15.5 %   Platelets 350 150 - 400 K/uL   nRBC 0.0 0.0 - 0.2 %    Comment: Performed at [facility], [address] [place], [place] [address]  Magnesium     Status: None   Collection Time: 01/07/22 12:27 PM  Result Value Ref Range   Magnesium 2.3 1.7 - 2.4 mg/dL    Comment:  Performed at [facility], [address], [place], [place] [address]  CBC  Status: Abnormal   Collection Time: 01/07/22  8:55 PM  Result Value Ref Range   WBC 13.9 (H) 4.0 - 10.5 K/uL   RBC 4.07 3.87 - 5.11 MIL/uL   Hemoglobin 11.5 (L) 12.0 - 15.0 g/dL   HCT 35.6 (L) 36.0 - 46.0 %   MCV 87.5 80.0 - 100.0 fL   MCH 28.3 26.0 - 34.0 pg   MCHC 32.3 30.0 - 36.0 g/dL   RDW 16.8 (H) 11.5 - 15.5 %   Platelets 316 150 - 400 K/uL   nRBC 0.0 0.0 - 0.2 %    Comment: Performed at [facility], [address] [place], [place] [address]  Creatinine, serum     Status: Abnormal   Collection Time: 01/07/22  8:55 PM  Result Value Ref Range   Creatinine, Ser 1.82 (H) 0.44 - 1.00 mg/dL   GFR, Estimated 29 (L) >60 mL/min    Comment: (NOTE) Calculated using the CKD-EPI Creatinine Equation (2021) Performed at [facility], [address], [place], [place] [address]   Basic metabolic panel     Status: Abnormal   Collection Time: 01/08/22  4:33 AM  Result Value Ref Range   Sodium 145 135 - 145 mmol/L   Potassium 2.7 (LL) 3.5 - 5.1 mmol/L    Comment: CRITICAL RESULT CALLED TO, READ BACK BY AND VERIFIED WITH: CHANEL BLACKWELL @ 0732 ON 01/08/22 C VARNER    Chloride 101 98 - 111 mmol/L   CO2 31 22 - 32 mmol/L   Glucose, Bld 105 (H) 70 - 99 mg/dL    Comment: Glucose reference range applies only to samples taken after fasting for at least 8 hours.   BUN 53 (H) 8 - 23 mg/dL   Creatinine, Ser 2.17 (H) 0.44 - 1.00 mg/dL   Calcium 8.1 (L) 8.9 - 10.3 mg/dL   GFR, Estimated 23 (L) >60 mL/min    Comment: (NOTE) Calculated using the CKD-EPI Creatinine Equation (2021)    Anion gap 13 5 - 15    Comment: Performed at [facility], [address], [place], [place] [address]  CBC     Status: Abnormal   Collection Time: 01/08/22  4:33 AM  Result Value Ref Range   WBC 13.1 (H) 4.0 - 10.5 K/uL   RBC 4.10 3.87 - 5.11 MIL/uL   Hemoglobin 11.7 (L) 12.0 - 15.0 g/dL   HCT 36.2 36.0 - 46.0 %   MCV 88.3 80.0 - 100.0  fL   MCH 28.5 26.0 - 34.0 pg   MCHC 32.3 30.0 - 36.0 g/dL   RDW 16.9 (H) 11.5 - 15.5 %   Platelets 296 150 - 400 K/uL   nRBC 0.0 0.0 - 0.2 %    Comment: Performed at [facility], [address] [place], [place] [address]  Magnesium     Status: None   Collection Time: 01/08/22  4:33 AM  Result Value Ref Range   Magnesium 2.4 1.7 - 2.4 mg/dL    Comment: Performed at [facility], [address] [place], [place] [address]  Phosphorus     Status: Abnormal   Collection Time: 01/08/22  4:33 AM  Result Value Ref Range   Phosphorus 4.8 (H) 2.5 - 4.6 mg/dL    Comment: Performed at [facility], [address] [place], [place] [address]    Acute Abdominal Series  Result Date: 01/08/2022 CLINICAL DATA: Nausea vomiting with abdominal pain. EXAM: DG ABDOMEN ACUTE WITH 1 VIEW CHEST COMPARISON:  CT scan 01/07/2022 FINDINGS: Low lung  volumes. The cardio pericardial silhouette is enlarged. Interstitial markings are diffusely coarsened with chronic features. Nodular and patchy opacities identified at the right base with associated retrocardiac atelectasis or infiltrate. NG tube tip is in the distal stomach. Diffuse gaseous distention of small bowel persists but appears decreased when comparing to scout image from yesterday's CT scan. Lumbar fusion hardware evident. Surgical clips in the right upper quadrant suggest prior cholecystectomy. IMPRESSION: 1. NG tube tip is in the distal stomach. 2. Diffuse gaseous distention of small bowel persists but appears decreased since yesterday's CT scan. 3. Nodular and patchy opacities at the right base with associated retrocardiac atelectasis or infiltrate. Given nodular appearance of the disease in the right base, CT chest recommended to exclude mass lesion. Electronically Signed   By: Misty Stanley M.D.   On: 01/08/2022 06:59   DG Abdomen 1 View  Result Date: 01/07/2022 CLINICAL DATA:  check NG tube placement EXAM: ABDOMEN - 1 VIEW COMPARISON:  CT from earlier the same  day FINDINGS: Gastric tube has been advanced into the stomach. Dilated small bowel loops in the mid abdomen as before. Lumbosacral fixation hardware noted. IMPRESSION: Gastric tube to the stomach Electronically Signed   By: Lucrezia Europe M.D.   On: 01/07/2022 17:39   CT ABDOMEN PELVIS WO CONTRAST  Result Date: 01/07/2022 CLINICAL DATA:  Abdominal pain, nausea, vomiting, diarrhea x5 days EXAM: CT ABDOMEN AND PELVIS WITHOUT CONTRAST TECHNIQUE: Multidetector CT imaging of the abdomen and pelvis was performed following the standard protocol without IV contrast. RADIATION DOSE REDUCTION: This exam was performed according to the departmental dose-optimization program which includes automated exposure control, adjustment of the mA and/or kV according to patient size and/or use of iterative reconstruction technique. COMPARISON:  01/04/2022 FINDINGS: Lower chest: Small patchy infiltrates are seen lower lung fields, more so in the right middle lobe. There is mild ectasia of bronchi. There is minimal right pleural effusion. Hepatobiliary: No focal abnormalities are seen in liver. There is no dilation of bile ducts. Surgical clips are seen in gallbladder fossa. Pancreas: There is atrophy.  No focal abnormalities are seen. Spleen: Unremarkable. Adrenals/Urinary Tract: Adrenals are unremarkable. There is no hydronephrosis. There are no renal or ureteral stones. Urinary bladder is not distended. High density in the lumen of the bladder may suggest residual contrast from previous CT. Stomach/Bowel: There is moderate to large fixed hiatal hernia. There is distention of stomach with fluid. There is fluid in the lower thoracic esophagus. There is interval worsening of proximal measuring up to 4.4 cm in diameter. Distal small bowel loops are decompressed. Exact level of transition is difficult to identify. It is possibly in right lower quadrant or right side of pelvis. Colon is not distended. Vascular/Lymphatic: Scattered arterial  calcifications are seen. Reproductive: Uterus is not seen. Other: Small amount of ascites is present. There is no pneumoperitoneum. Musculoskeletal: There is lumbar fusion at L4-L5 and L5-S1 levels. IMPRESSION: There is abnormal dilation of proximal small bowel loops with decompression of distal small bowel loops consistent with high-grade partial small bowel obstruction, possibly in right lower quadrant or right pelvic cavity. This may be caused by adhesions or internal hernia or neoplastic process. There is significant interval worsening of small bowel dilation since 01/04/2022. There is small ascites. There is no pneumoperitoneum. Moderate to large fixed hiatal hernia. There is fluid in the lumen of stomach within the hiatal hernia and fluid in the lumen of lower thoracic esophagus, most likely due to high-grade small bowel obstruction. There is  no hydronephrosis. There are patchy infiltrates in lower lung fields, more so in right middle lobe suggesting atelectasis/pneumonia. There is minimal right pleural effusion. Electronically Signed   By: Elmer Picker M.D.   On: 01/07/2022 16:02    ROS:  Pertinent items are noted in HPI.  Blood pressure (!) 94/57, pulse 90, temperature 98.4 F (36.9 C), temperature source Oral, resp. rate 16, height '5\' 4"'$  (1.626 m), weight 78.7 kg, SpO2 93 %. Physical Exam: Pleasant well-developed well-nourished white female no acute distress Head is normocephalic, atraumatic Lungs clear to auscultation with equal breath sounds bilaterally Heart examination reveals regular rate and rhythm without S3, S4, murmurs Abdomen is soft, nontender, nondistended.  No rigidity is noted.  Active bowel sounds appreciated.  CT scan images personally reviewed  Assessment/Plan: Impression: CT scan evidence of a partial small bowel obstruction.  Patient has had significant NG tube output of 1700 cc.  She is hypokalemic and has worsening renal function.  This is probably secondary to  third spacing.  At this point, no need for acute surgical intervention at the present time. I would correct her hypokalemia and her renal insufficiency.  Stool studies are pending.  Continue NG tube decompression.  Patient may have ice chips.  Will follow with you.  Aviva Signs 01/08/2022, 8:09 AM

## 2022-01-09 ENCOUNTER — Inpatient Hospital Stay (HOSPITAL_COMMUNITY): Payer: Medicare Other

## 2022-01-09 DIAGNOSIS — K449 Diaphragmatic hernia without obstruction or gangrene: Secondary | ICD-10-CM | POA: Diagnosis not present

## 2022-01-09 DIAGNOSIS — N179 Acute kidney failure, unspecified: Secondary | ICD-10-CM | POA: Diagnosis not present

## 2022-01-09 DIAGNOSIS — K56600 Partial intestinal obstruction, unspecified as to cause: Secondary | ICD-10-CM | POA: Diagnosis not present

## 2022-01-09 LAB — BASIC METABOLIC PANEL
Anion gap: 15 (ref 5–15)
BUN: 75 mg/dL — ABNORMAL HIGH (ref 8–23)
CO2: 25 mmol/L (ref 22–32)
Calcium: 8 mg/dL — ABNORMAL LOW (ref 8.9–10.3)
Chloride: 105 mmol/L (ref 98–111)
Creatinine, Ser: 3.72 mg/dL — ABNORMAL HIGH (ref 0.44–1.00)
GFR, Estimated: 12 mL/min — ABNORMAL LOW (ref >60)
Glucose, Bld: 87 mg/dL (ref 70–99)
Potassium: 4.7 mmol/L (ref 3.5–5.1)
Sodium: 145 mmol/L (ref 135–145)

## 2022-01-09 LAB — URINE CULTURE: Culture: >=100000 — AB

## 2022-01-09 MED ORDER — PANTOPRAZOLE SODIUM 40 MG IV SOLR
40.0000 mg | Freq: Every day | INTRAVENOUS | Status: DC
  Administered 2022-01-11 – 2022-01-14 (×4): 40 mg via INTRAVENOUS
  Filled 2022-01-09 (×4): qty 10

## 2022-01-09 MED ORDER — DEXTROSE 5 % IV SOLN: INTRAVENOUS | Status: DC

## 2022-01-09 MED ORDER — LORAZEPAM 2 MG/ML IJ SOLN: 0.5000 mg | Freq: Four times a day (QID) | INTRAMUSCULAR | Status: DC | PRN

## 2022-01-09 MED ORDER — LACTATED RINGERS IV BOLUS
500.0000 mL | Freq: Once | INTRAVENOUS | Status: AC
  Administered 2022-01-09: 500 mL via INTRAVENOUS

## 2022-01-09 NOTE — Progress Notes (Signed)
Pt. Pulled NG tube out approximately 12 inches from R. nare. Advanced NG tube back into position. Notified MD for chest xray order. NG tube placed properly, on second attempt, with chest x-ray confirmation. Pt. Tolerated appropriately.

## 2022-01-09 NOTE — Progress Notes (Signed)
[facility] Progress Note     Subjective: No Bms. Pain improved. NG 1600 output, dark brown color.   Objective: Vital signs in last 24 hours: Temp:  [97.9 F (36.6 C)-98.1 F (36.7 C)] 97.9 F (36.6 C) (08/16 0456) Pulse Rate:  [76-85] 76 (08/16 1300) Resp:  [16-18] 16 (08/16 0456) BP: (92-99)/(47-56) 93/52 (08/16 1300) SpO2:  [92 %-97 %] 95 % (08/16 0456) Last BM Date : 01/08/22  Intake/Output from previous day: 08/15 0701 - 08/16 0700 In: 2936 [P.O.:60; I.V.:2574.5; IV Piggyback:301.5] Out: 1600 [Emesis/NG output:1600] Intake/Output this shift: No intake/output data recorded.  General appearance: alert, no distress, and confused GI: soft, nondistended, nontender  Lab Results:  Recent Labs    01/07/22 2055 01/08/22 0433  WBC 13.9* 13.1*  HGB 11.5* 11.7*  HCT 35.6* 36.2  PLT 316 296   BMET Recent Labs    01/08/22 1243 01/09/22 0415  NA 143 145  K 3.1* 4.7  CL 99 105  CO2 29 25  GLUCOSE 104* 87  BUN 62* 75*  CREATININE 2.83* 3.72*  CALCIUM 7.7* 8.0*   PT/INR No results for input(s): "LABPROT", "INR" in the last 72 hours.  Studies/Results: DG Chest 1 View  Result Date: 01/09/2022 CLINICAL DATA:  Check nasogastric catheter EXAM: PORTABLE CHEST 1 VIEW COMPARISON:  Film from earlier in the same day. FINDINGS: Gastric catheter has been advanced but remains coiled within the hiatal hernia. This should be withdrawn completely and readvanced in attempt to gain access to majority of the body of the stomach within the abdomen. Cardiac shadow is stable. Patchy airspace opacities are again seen stable from the prior exam. IMPRESSION: Gastric catheter coiled within hiatal hernia. This should be withdrawn and readvanced as described. Electronically Signed   By: Inez Catalina M.D.   On: 01/09/2022 03:18   DG CHEST PORT 1 VIEW  Result Date: 01/09/2022 CLINICAL DATA:  NG tube placement EXAM: PORTABLE CHEST 1 VIEW COMPARISON:  Radiographs 01/08/2022  FINDINGS: The enteric tube has been readjusted and is now looped in the lower chest with tip oriented superiorly likely within the intrathoracic portion of the stomach. Remainder unchanged. Hazy airspace opacities in the right lower lung. Left basilar atelectasis/consolidation. Prominent interstitial markings. Probable small right-greater-than-left pleural effusions. Stable cardiomediastinal silhouette. Aortic calcifications. IMPRESSION: 1. The NG tube is coiled in the lower chest with tip oriented superiorly likely within the intrathoracic portion of the stomach. 2. Opacities in the lower lungs suspicious for pneumonia. 3. Probable small right-greater-than-left pleural effusions. These results will be called to the ordering clinician or representative by the Radiologist Assistant, and communication documented in the PACS or [employer]. Electronically Signed   By: Placido Sou M.D.   On: 01/09/2022 02:10   Acute Abdominal Series  Result Date: 01/08/2022 CLINICAL DATA: Nausea vomiting with abdominal pain. EXAM: DG ABDOMEN ACUTE WITH 1 VIEW CHEST COMPARISON:  CT scan 01/07/2022 FINDINGS: Low lung volumes. The cardio pericardial silhouette is enlarged. Interstitial markings are diffusely coarsened with chronic features. Nodular and patchy opacities identified at the right base with associated retrocardiac atelectasis or infiltrate. NG tube tip is in the distal stomach. Diffuse gaseous distention of small bowel persists but appears decreased when comparing to scout image from yesterday's CT scan. Lumbar fusion hardware evident. Surgical clips in the right upper quadrant suggest prior cholecystectomy. IMPRESSION: 1. NG tube tip is in the distal stomach. 2. Diffuse gaseous distention of small bowel persists but appears decreased since yesterday's CT scan. 3. Nodular and patchy  opacities at the right base with associated retrocardiac atelectasis or infiltrate. Given nodular appearance of the disease in the  right base, CT chest recommended to exclude mass lesion. Electronically Signed   By: Misty Stanley M.D.   On: 01/08/2022 06:59   DG Abdomen 1 View  Result Date: 01/07/2022 CLINICAL DATA:  check NG tube placement EXAM: ABDOMEN - 1 VIEW COMPARISON:  CT from earlier the same day FINDINGS: Gastric tube has been advanced into the stomach. Dilated small bowel loops in the mid abdomen as before. Lumbosacral fixation hardware noted. IMPRESSION: Gastric tube to the stomach Electronically Signed   By: Lucrezia Europe M.D.   On: 01/07/2022 17:39   CT ABDOMEN PELVIS WO CONTRAST  Result Date: 01/07/2022 CLINICAL DATA:  Abdominal pain, nausea, vomiting, diarrhea x5 days EXAM: CT ABDOMEN AND PELVIS WITHOUT CONTRAST TECHNIQUE: Multidetector CT imaging of the abdomen and pelvis was performed following the standard protocol without IV contrast. RADIATION DOSE REDUCTION: This exam was performed according to the departmental dose-optimization program which includes automated exposure control, adjustment of the mA and/or kV according to patient size and/or use of iterative reconstruction technique. COMPARISON:  01/04/2022 FINDINGS: Lower chest: Small patchy infiltrates are seen lower lung fields, more so in the right middle lobe. There is mild ectasia of bronchi. There is minimal right pleural effusion. Hepatobiliary: No focal abnormalities are seen in liver. There is no dilation of bile ducts. Surgical clips are seen in gallbladder fossa. Pancreas: There is atrophy.  No focal abnormalities are seen. Spleen: Unremarkable. Adrenals/Urinary Tract: Adrenals are unremarkable. There is no hydronephrosis. There are no renal or ureteral stones. Urinary bladder is not distended. High density in the lumen of the bladder may suggest residual contrast from previous CT. Stomach/Bowel: There is moderate to large fixed hiatal hernia. There is distention of stomach with fluid. There is fluid in the lower thoracic esophagus. There is interval  worsening of proximal measuring up to 4.4 cm in diameter. Distal small bowel loops are decompressed. Exact level of transition is difficult to identify. It is possibly in right lower quadrant or right side of pelvis. Colon is not distended. Vascular/Lymphatic: Scattered arterial calcifications are seen. Reproductive: Uterus is not seen. Other: Small amount of ascites is present. There is no pneumoperitoneum. Musculoskeletal: There is lumbar fusion at L4-L5 and L5-S1 levels. IMPRESSION: There is abnormal dilation of proximal small bowel loops with decompression of distal small bowel loops consistent with high-grade partial small bowel obstruction, possibly in right lower quadrant or right pelvic cavity. This may be caused by adhesions or internal hernia or neoplastic process. There is significant interval worsening of small bowel dilation since 01/04/2022. There is small ascites. There is no pneumoperitoneum. Moderate to large fixed hiatal hernia. There is fluid in the lumen of stomach within the hiatal hernia and fluid in the lumen of lower thoracic esophagus, most likely due to high-grade small bowel obstruction. There is no hydronephrosis. There are patchy infiltrates in lower lung fields, more so in right middle lobe suggesting atelectasis/pneumonia. There is minimal right pleural effusion. Electronically Signed   By: Elmer Picker M.D.   On: 01/07/2022 16:02    Anti-infectives: Anti-infectives (From admission, onward)    Start     Dose/Rate Route Frequency Ordered Stop   01/08/22 0830  Levofloxacin (LEVAQUIN) IVPB 250 mg  Status:  Discontinued        250 mg 50 mL/hr over 60 Minutes Intravenous Every 24 hours 01/08/22 0724 01/08/22 0727   01/08/22 0830  Levofloxacin (LEVAQUIN) IVPB 250 mg  Status:  Discontinued        250 mg 50 mL/hr over 60 Minutes Intravenous Every 48 hours 01/08/22 0727 01/08/22 0732   01/08/22 0830  Levofloxacin (LEVAQUIN) IVPB 250 mg        250 mg 50 mL/hr over 60 Minutes  Intravenous Every 24 hours 01/08/22 0732         Assessment/Plan: Patient with SBO on CT scan 8/14 and now no diarrhea. Cr up trending. CT without Ivcontrast and with oral ordered to further assess, discussed with radiology option CT versus SBFT> felt like CT will give [place] more information about possible transition point and any thickening of the bowel  Discussed with patient and family.   LOS: 2 days    Virl Cagey 01/09/2022

## 2022-01-09 NOTE — Progress Notes (Signed)
PROGRESS NOTE    Patient: [name]                            PCP: Chevis Pretty, FNP                    DOB: 05-13-1946            DOA: 01/07/2022 EXB:[id number]             DOS: 01/09/2022, 11:31 AM   LOS: 2 days   Date of Service: The patient was seen and examined on 01/09/2022  Subjective:   The patient was seen and examined this morning, stable sitting up in chair, NG tube in place to wall suction with copious greenish liquid in container Reporting bowel movement yesterday but no gas or bowel movement last 24 hours  Brief Narrative:   [name] is a 76 y.o. female with medical history significant of 76 year old female past medical history significant for chronic obstructive pulmonary disease, hyperlipidemia, essential hypertension, hypothyroidism, pulmonary embolism in the past, osteoarthritis, and depression who presents from home with vomiting and diarrhea since Friday.   Presented to urgent care on Friday with acute onset of abdominal pain a CT scan was obtained and was unremarkable she received some IV fluids and antiemetics and was discharged home.  Her V and D persisted and that previous treatment did not alleviate her symptoms so she presented here with her son today.  He provided some information which was not available on Friday.  Apparently the patient had eaten some fruit that she had kept outside on a porch but some wild animal had gotten into the fruit she cut away the parts that had been nibbled on and ate the fruit on Thursday.  She has been sick since then.  ED:  CT scan was obtained and found to show a partial small bowel obstruction and significant changes compared to the CT scan she had on Friday.   ED Course: Patient was given an oral dose of Zofran and some IV fluid drip.  She continued to have persistent nausea in the emergency department and vomited at least 200 mL during my exam.  She was given additional Zofran at that point in her IV.  Also had 1  episode of diarrhea while here in the emergency department which she describes as small. Labs are consistent with acute kidney injury and hypokalemia CT scan shows a partial small bowel obstruction.  I discussed the case with Dr. Doren Custard who has ordered an NG tube and communicated with surgery who will see the patient in a.m.   Assessment/Plan Principal Problem:   Partial small bowel obstruction (HCC) Active Problems:   COPD (chronic obstructive pulmonary disease) (HCC)   Hyperlipidemia   Hypothyroidism (acquired)   Essential hypertension   History of pulmonary embolus (PE)    Partial small bowel obstruction: -We will continue to monitor closely -1 bowel movement yesterday, no bowel movement or gas in past 12 hours  -NG-tube in place,--a -C. difficile ordered discontinued -1 soft pasty stool 01/08/2022 -Patient will be kept n.p.o. -Due to hypotension and AKI, aggressive IV fluid will be initiated today  -Status post evaluation by general surgery Dr. Sharla Kidney pursue medical, conservative management  -Zofran IV or sublingually for nausea   Acute renal insufficiency  -Likely exacerbated due to dehydration, SBO, hypertension (? CKD -creatinine 1.09 on 01/04/2022) -Creatinine 2.17, 2.83, 3.72 BUN 53, 62, 75 -Monitoring, avoiding nephrotoxins -  Continue IV fluid hydration  COPD:  -Remained stable -Currently stable and well compensated.   -As needed DuoNeb bronchodilators   Hyperlipidemia: Patient manages her hyperlipidemia with diet control.   Hypothyroidism: Continue home medications once can tolerate p.o.   Essential hypertension:  -Hypotensive, we will holding home BP meds -  as needed hydralazine.  Remote history of pulmonary embolism: Noted.  Not currently anticoagulated as this was in the past.     ----------------------------------------------------------------------------------------------------------------------------------------------- Nutritional status:  The  patient's BMI is: Body mass index is 29.78 kg/m. I agree with the assessment and plan as outlined below: Nutrition Status:         ------------------------------------------------------------------------------------------------------------------------------------------------  DVT prophylaxis:  heparin injection 5,000 Units Start: 01/07/22 2200   Code Status:   Code Status: Full Code  Family Communication: Son present at bedside-updated The above findings and plan of care has been discussed with patient (and family)  in detail,  they expressed understanding and agreement of above. -Advance care planning has been discussed.   Admission status:   Status is: Inpatient Remains inpatient appropriate because: Need to be n.p.o. IV fluids, surgical evaluation     Procedures:   No admission procedures for hospital encounter.   Antimicrobials:  Anti-infectives (From admission, onward)    Start     Dose/Rate Route Frequency Ordered Stop   01/08/22 0830  Levofloxacin (LEVAQUIN) IVPB 250 mg  Status:  Discontinued        250 mg 50 mL/hr over 60 Minutes Intravenous Every 24 hours 01/08/22 0724 01/08/22 0727   01/08/22 0830  Levofloxacin (LEVAQUIN) IVPB 250 mg  Status:  Discontinued        250 mg 50 mL/hr over 60 Minutes Intravenous Every 48 hours 01/08/22 0727 01/08/22 0732   01/08/22 0830  Levofloxacin (LEVAQUIN) IVPB 250 mg        250 mg 50 mL/hr over 60 Minutes Intravenous Every 24 hours 01/08/22 0732          Medication:   heparin  5,000 Units Subcutaneous Q8H   pantoprazole (PROTONIX) IV  40 mg Intravenous Q12H   sodium chloride flush  3 mL Intravenous Q12H    sodium chloride, alum & mag hydroxide-simeth, LORazepam, morphine injection, morphine injection, [DISCONTINUED] ondansetron **OR** ondansetron (ZOFRAN) IV, sodium chloride flush   Objective:   Vitals:   01/08/22 1252 01/08/22 2029 01/08/22 2307 01/09/22 0456  BP: (!) 101/59 (!) 99/47 (!) 92/56 (!) 97/51   Pulse:  85 82 79  Resp: '18 18 16 16  '$ Temp: (!) 97.4 F (36.3 C) 98.1 F (36.7 C)  97.9 F (36.6 C)  TempSrc: Oral     SpO2: 96% 92% 97% 95%  Weight:      Height:        Intake/Output Summary (Last 24 hours) at 01/09/2022 1131 Last data filed at 01/09/2022 0900 Gross per 24 hour  Intake 2935.96 ml  Output 1600 ml  Net 1335.96 ml   Filed Weights   01/07/22 1138 01/07/22 2108  Weight: 78.7 kg 78.7 kg     Examination:     Physical Exam:   General:  AAO x 3,  cooperative, no distress;   HEENT:  NG tube remain in place normocephalic, PERRL, otherwise with in Normal limits   Neuro:  CNII-XII intact. , normal motor and sensation, reflexes intact   Lungs:   Clear to auscultation BL, Respirations unlabored,  No wheezes / crackles  Cardio:    S1/S2, RRR, No murmure, No Rubs or Gallops  Abdomen:  Soft, non-tender, bowel sounds active all four quadrants, no guarding or peritoneal signs.  Muscular  skeletal:  Limited exam -global generalized weaknesses - in bed, able to move all 4 extremities,   2+ pulses,  symmetric, No pitting edema  Skin:  Dry, warm to touch, negative for any Rashes,  Wounds: Please see nursing documentation          ------------------------------------------------------------------------------------------------------------------------------------------    LABs:     Latest Ref Rng & Units 01/08/2022    4:33 AM 01/07/2022    8:55 PM 01/07/2022   12:27 PM  CBC  WBC 4.0 - 10.5 K/uL 13.1  13.9  9.8   Hemoglobin 12.0 - 15.0 g/dL 11.7  11.5  11.5   Hematocrit 36.0 - 46.0 % 36.2  35.6  36.0   Platelets 150 - 400 K/uL 296  316  350       Latest Ref Rng & Units 01/09/2022    4:15 AM 01/08/2022   12:43 PM 01/08/2022    4:33 AM  CMP  Glucose 70 - 99 mg/dL 87  104  105   BUN 8 - 23 mg/dL 75  62  53   Creatinine 0.44 - 1.00 mg/dL 3.72  2.83  2.17   Sodium 135 - 145 mmol/L 145  143  145   Potassium 3.5 - 5.1 mmol/L 4.7  3.1  2.7   Chloride 98 - 111  mmol/L 105  99  101   CO2 22 - 32 mmol/L '25  29  31   '$ Calcium 8.9 - 10.3 mg/dL 8.0  7.7  8.1        Micro Results Recent Results (from the past 240 hour(s))  Urine Culture     Status: Abnormal   Collection Time: 01/07/22  2:55 PM   Specimen: Urine, Clean Catch  Result Value Ref Range Status   Specimen Description   Final    URINE, CLEAN CATCH Performed at [facility], [address], [place], [place] [address]    Special Requests   Final    NONE Performed at [facility], [address], [place], [place] [address]    Culture (A)  Final    >=100,000 COLONIES/mL MULTIPLE SPECIES PRESENT, SUGGEST RECOLLECTION   Report Status 01/09/2022 FINAL  Final    Radiology Reports DG Chest 1 View  Result Date: 01/09/2022 CLINICAL DATA:  Check nasogastric catheter EXAM: PORTABLE CHEST 1 VIEW COMPARISON:  Film from earlier in the same day. FINDINGS: Gastric catheter has been advanced but remains coiled within the hiatal hernia. This should be withdrawn completely and readvanced in attempt to gain access to majority of the body of the stomach within the abdomen. Cardiac shadow is stable. Patchy airspace opacities are again seen stable from the prior exam. IMPRESSION: Gastric catheter coiled within hiatal hernia. This should be withdrawn and readvanced as described. Electronically Signed   By: Inez Catalina M.D.   On: 01/09/2022 03:18   DG CHEST PORT 1 VIEW  Result Date: 01/09/2022 CLINICAL DATA:  NG tube placement EXAM: PORTABLE CHEST 1 VIEW COMPARISON:  Radiographs 01/08/2022 FINDINGS: The enteric tube has been readjusted and is now looped in the lower chest with tip oriented superiorly likely within the intrathoracic portion of the stomach. Remainder unchanged. Hazy airspace opacities in the right lower lung. Left basilar atelectasis/consolidation. Prominent interstitial markings. Probable small right-greater-than-left pleural effusions. Stable cardiomediastinal silhouette. Aortic calcifications.  IMPRESSION: 1. The NG tube is coiled in the lower chest with tip oriented superiorly  likely within the intrathoracic portion of the stomach. 2. Opacities in the lower lungs suspicious for pneumonia. 3. Probable small right-greater-than-left pleural effusions. These results will be called to the ordering clinician or representative by the Radiologist Assistant, and communication documented in the PACS or [employer]. Electronically Signed   By: Placido Sou M.D.   On: 01/09/2022 02:10    SIGNED: Deatra James, MD, FHM. [facility],  Pager (please use [email or web address] to page/text) Please use Epic Secure Chat for non-urgent communication (7AM-7PM)  If 7PM-7AM, please contact night-coverage [email or web address], 01/09/2022, 11:31 AM

## 2022-01-09 NOTE — Progress Notes (Signed)
[facility]  CT was continued dilated small bow contrast only through part of the small bowel. Looks like transition in the right lower quadrant area official Read is still pending updated son and left a message. Patient asleep in room per RN.  Curlene Labrum MD

## 2022-01-10 ENCOUNTER — Inpatient Hospital Stay (HOSPITAL_COMMUNITY): Payer: Medicare Other

## 2022-01-10 ENCOUNTER — Other Ambulatory Visit: Payer: Self-pay

## 2022-01-10 ENCOUNTER — Encounter (HOSPITAL_COMMUNITY)
Admission: EM | Disposition: A | Discharge status: Nursing Home (Includes ICF) | Payer: Self-pay | Source: Home / Self Care | Attending: Student

## 2022-01-10 ENCOUNTER — Inpatient Hospital Stay (HOSPITAL_COMMUNITY): Payer: Medicare Other | Admitting: Anesthesiology

## 2022-01-10 ENCOUNTER — Encounter (HOSPITAL_COMMUNITY): Payer: Self-pay | Admitting: Internal Medicine

## 2022-01-10 DIAGNOSIS — I10 Essential (primary) hypertension: Secondary | ICD-10-CM

## 2022-01-10 DIAGNOSIS — R579 Shock, unspecified: Secondary | ICD-10-CM | POA: Diagnosis not present

## 2022-01-10 DIAGNOSIS — E039 Hypothyroidism, unspecified: Secondary | ICD-10-CM | POA: Diagnosis not present

## 2022-01-10 DIAGNOSIS — N179 Acute kidney failure, unspecified: Secondary | ICD-10-CM | POA: Diagnosis not present

## 2022-01-10 DIAGNOSIS — L8990 Pressure ulcer of unspecified site, unspecified stage: Secondary | ICD-10-CM | POA: Insufficient documentation

## 2022-01-10 DIAGNOSIS — K56609 Unspecified intestinal obstruction, unspecified as to partial versus complete obstruction: Secondary | ICD-10-CM | POA: Diagnosis not present

## 2022-01-10 DIAGNOSIS — J9602 Acute respiratory failure with hypercapnia: Secondary | ICD-10-CM | POA: Diagnosis not present

## 2022-01-10 DIAGNOSIS — J449 Chronic obstructive pulmonary disease, unspecified: Secondary | ICD-10-CM

## 2022-01-10 DIAGNOSIS — Z86711 Personal history of pulmonary embolism: Secondary | ICD-10-CM | POA: Diagnosis not present

## 2022-01-10 DIAGNOSIS — A419 Sepsis, unspecified organism: Secondary | ICD-10-CM

## 2022-01-10 DIAGNOSIS — J90 Pleural effusion, not elsewhere classified: Secondary | ICD-10-CM | POA: Diagnosis not present

## 2022-01-10 DIAGNOSIS — J9601 Acute respiratory failure with hypoxia: Secondary | ICD-10-CM | POA: Diagnosis not present

## 2022-01-10 HISTORY — PX: LYSIS OF ADHESION: SHX5961

## 2022-01-10 HISTORY — PX: CENTRAL LINE INSERTION: CATH118232

## 2022-01-10 HISTORY — PX: LAPAROTOMY: SHX154

## 2022-01-10 LAB — BASIC METABOLIC PANEL
Anion gap: 10 (ref 5–15)
BUN: 94 mg/dL — ABNORMAL HIGH (ref 8–23)
CO2: 26 mmol/L (ref 22–32)
Calcium: 7.7 mg/dL — ABNORMAL LOW (ref 8.9–10.3)
Chloride: 99 mmol/L (ref 98–111)
Creatinine, Ser: 4.82 mg/dL — ABNORMAL HIGH (ref 0.44–1.00)
GFR, Estimated: 9 mL/min — ABNORMAL LOW (ref >60)
Glucose, Bld: 139 mg/dL — ABNORMAL HIGH (ref 70–99)
Potassium: 4.9 mmol/L (ref 3.5–5.1)
Sodium: 135 mmol/L (ref 135–145)

## 2022-01-10 LAB — GLUCOSE, CAPILLARY
Glucose-Capillary: 111 mg/dL — ABNORMAL HIGH (ref 70–99)
Glucose-Capillary: 121 mg/dL — ABNORMAL HIGH (ref 70–99)
Glucose-Capillary: 24 mg/dL — CL (ref 70–99)
Glucose-Capillary: 58 mg/dL — ABNORMAL LOW (ref 70–99)
Glucose-Capillary: 67 mg/dL — ABNORMAL LOW (ref 70–99)
Glucose-Capillary: 83 mg/dL (ref 70–99)
Glucose-Capillary: 95 mg/dL (ref 70–99)

## 2022-01-10 LAB — BLOOD GAS, ARTERIAL
Acid-base deficit: 2.3 mmol/L — ABNORMAL HIGH (ref 0.0–2.0)
Bicarbonate: 23.2 mmol/L (ref 20.0–28.0)
Drawn by: 39898
O2 Saturation: 99.3 %
Patient temperature: 36.4
pCO2 arterial: 41 mmHg (ref 32–48)
pH, Arterial: 7.36 (ref 7.35–7.45)
pO2, Arterial: 107 mmHg (ref 83–108)

## 2022-01-10 LAB — RENAL FUNCTION PANEL
Albumin: 2.4 g/dL — ABNORMAL LOW (ref 3.5–5.0)
Albumin: 2.2 g/dL — ABNORMAL LOW (ref 3.5–5.0)
Anion gap: 9 (ref 5–15)
Anion gap: 10 (ref 5–15)
BUN: 87 mg/dL — ABNORMAL HIGH (ref 8–23)
BUN: 85 mg/dL — ABNORMAL HIGH (ref 8–23)
CO2: 24 mmol/L (ref 22–32)
CO2: 23 mmol/L (ref 22–32)
Calcium: 7.3 mg/dL — ABNORMAL LOW (ref 8.9–10.3)
Calcium: 7.3 mg/dL — ABNORMAL LOW (ref 8.9–10.3)
Chloride: 103 mmol/L (ref 98–111)
Chloride: 103 mmol/L (ref 98–111)
Creatinine, Ser: 4.66 mg/dL — ABNORMAL HIGH (ref 0.44–1.00)
Creatinine, Ser: 4.55 mg/dL — ABNORMAL HIGH (ref 0.44–1.00)
GFR, Estimated: 9 mL/min — ABNORMAL LOW (ref >60)
GFR, Estimated: 10 mL/min — ABNORMAL LOW (ref >60)
Glucose, Bld: 122 mg/dL — ABNORMAL HIGH (ref 70–99)
Glucose, Bld: 98 mg/dL (ref 70–99)
Phosphorus: 3.4 mg/dL (ref 2.5–4.6)
Phosphorus: 3.6 mg/dL (ref 2.5–4.6)
Potassium: 4.3 mmol/L (ref 3.5–5.1)
Potassium: 4.3 mmol/L (ref 3.5–5.1)
Sodium: 136 mmol/L (ref 135–145)
Sodium: 136 mmol/L (ref 135–145)

## 2022-01-10 LAB — CBC
HCT: 35.6 % — ABNORMAL LOW (ref 36.0–46.0)
Hemoglobin: 11.1 g/dL — ABNORMAL LOW (ref 12.0–15.0)
MCH: 28.1 pg (ref 26.0–34.0)
MCHC: 31.2 g/dL (ref 30.0–36.0)
MCV: 90.1 fL (ref 80.0–100.0)
Platelets: 226 10*3/uL (ref 150–400)
RBC: 3.95 MIL/uL (ref 3.87–5.11)
RDW: 17.2 % — ABNORMAL HIGH (ref 11.5–15.5)
WBC: 21.3 10*3/uL — ABNORMAL HIGH (ref 4.0–10.5)
nRBC: 0.5 % — ABNORMAL HIGH (ref 0.0–0.2)

## 2022-01-10 LAB — LACTIC ACID, PLASMA
Lactic Acid, Venous: 2 mmol/L (ref 0.5–1.9)
Lactic Acid, Venous: 1 mmol/L (ref 0.5–1.9)

## 2022-01-10 LAB — SURGICAL PCR SCREEN
MRSA, PCR: NEGATIVE
Staphylococcus aureus: NEGATIVE

## 2022-01-10 LAB — MRSA NEXT GEN BY PCR, NASAL: MRSA by PCR Next Gen: NOT DETECTED

## 2022-01-10 LAB — PREPARE RBC (CROSSMATCH)

## 2022-01-10 LAB — ABO/RH: ABO/RH(D): O POS

## 2022-01-10 SURGERY — LAPAROTOMY, EXPLORATORY
Anesthesia: General | Site: Neck | Laterality: Right

## 2022-01-10 MED ORDER — SUCCINYLCHOLINE CHLORIDE 200 MG/10ML IV SOSY
PREFILLED_SYRINGE | INTRAVENOUS | Status: DC | PRN
  Administered 2022-01-10: 120 mg via INTRAVENOUS

## 2022-01-10 MED ORDER — DEXTROSE 50 % IV SOLN
12.5000 g | INTRAVENOUS | Status: AC
Start: 2022-01-10 — End: 2022-01-10
  Administered 2022-01-10: 12.5 g via INTRAVENOUS
  Filled 2022-01-10: qty 50

## 2022-01-10 MED ORDER — ALBUMIN HUMAN 5 % IV SOLN: INTRAVENOUS | Status: DC | PRN

## 2022-01-10 MED ORDER — LEVOFLOXACIN IN D5W 500 MG/100ML IV SOLN: 500.0000 mg | INTRAVENOUS | Status: DC

## 2022-01-10 MED ORDER — CHLORHEXIDINE GLUCONATE CLOTH 2 % EX PADS
6.0000 | MEDICATED_PAD | Freq: Every day | CUTANEOUS | Status: DC
  Administered 2022-01-10 – 2022-01-19 (×11): 6 via TOPICAL

## 2022-01-10 MED ORDER — PROPOFOL 10 MG/ML IV BOLUS
INTRAVENOUS | Status: DC | PRN
  Administered 2022-01-10: 10 mg via INTRAVENOUS

## 2022-01-10 MED ORDER — LACTATED RINGERS IV BOLUS: 1000.0000 mL | Freq: Once | INTRAVENOUS | Status: DC

## 2022-01-10 MED ORDER — PROPOFOL 500 MG/50ML IV EMUL
INTRAVENOUS | Status: AC
  Filled 2022-01-10: qty 50

## 2022-01-10 MED ORDER — ORAL CARE MOUTH RINSE
15.0000 mL | OROMUCOSAL | Status: DC
  Administered 2022-01-10 – 2022-01-14 (×46): 15 mL via OROMUCOSAL

## 2022-01-10 MED ORDER — PHENYLEPHRINE 80 MCG/ML (10ML) SYRINGE FOR IV PUSH (FOR BLOOD PRESSURE SUPPORT)
PREFILLED_SYRINGE | INTRAVENOUS | Status: AC
  Filled 2022-01-10: qty 20

## 2022-01-10 MED ORDER — CIPROFLOXACIN IN D5W 400 MG/200ML IV SOLN
400.0000 mg | INTRAVENOUS | Status: AC
  Administered 2022-01-10: 400 mg via INTRAVENOUS
  Filled 2022-01-10: qty 200

## 2022-01-10 MED ORDER — DEXTROSE-NACL 5-0.9 % IV SOLN: INTRAVENOUS | Status: AC

## 2022-01-10 MED ORDER — SODIUM CHLORIDE 0.9 % IV BOLUS
500.0000 mL | Freq: Once | INTRAVENOUS | Status: AC
  Administered 2022-01-10: 500 mL via INTRAVENOUS

## 2022-01-10 MED ORDER — CHLORHEXIDINE GLUCONATE CLOTH 2 % EX PADS
6.0000 | MEDICATED_PAD | Freq: Once | CUTANEOUS | Status: AC
  Administered 2022-01-10: 6 via TOPICAL

## 2022-01-10 MED ORDER — DEXTROSE 50 % IV SOLN
25.0000 g | Freq: Once | INTRAVENOUS | Status: AC
  Administered 2022-01-10: 25 g via INTRAVENOUS

## 2022-01-10 MED ORDER — BUPIVACAINE LIPOSOME 1.3 % IJ SUSP
INTRAMUSCULAR | Status: AC
  Filled 2022-01-10: qty 20

## 2022-01-10 MED ORDER — LIDOCAINE HCL (PF) 2 % IJ SOLN
INTRAMUSCULAR | Status: AC
  Filled 2022-01-10: qty 5

## 2022-01-10 MED ORDER — FENTANYL CITRATE (PF) 100 MCG/2ML IJ SOLN
INTRAMUSCULAR | Status: DC | PRN
  Administered 2022-01-10: 25 ug via INTRAVENOUS
  Administered 2022-01-10: 50 ug via INTRAVENOUS
  Administered 2022-01-10: 25 ug via INTRAVENOUS

## 2022-01-10 MED ORDER — SODIUM CHLORIDE 0.9 % IV SOLN
250.0000 mL | INTRAVENOUS | Status: DC
  Administered 2022-01-13 – 2022-01-14 (×2): 250 mL via INTRAVENOUS

## 2022-01-10 MED ORDER — SUCCINYLCHOLINE CHLORIDE 200 MG/10ML IV SOSY
PREFILLED_SYRINGE | INTRAVENOUS | Status: AC
  Filled 2022-01-10: qty 10

## 2022-01-10 MED ORDER — FUROSEMIDE 10 MG/ML IJ SOLN
80.0000 mg | Freq: Once | INTRAMUSCULAR | Status: AC
  Administered 2022-01-10: 80 mg via INTRAVENOUS
  Filled 2022-01-10: qty 8

## 2022-01-10 MED ORDER — LACTATED RINGERS IV BOLUS
1000.0000 mL | Freq: Once | INTRAVENOUS | Status: AC
  Administered 2022-01-10: 1000 mL via INTRAVENOUS

## 2022-01-10 MED ORDER — DEXTROSE 50 % IV SOLN
INTRAVENOUS | Status: AC
  Filled 2022-01-10: qty 50

## 2022-01-10 MED ORDER — PHENYLEPHRINE HCL-NACL 20-0.9 MG/250ML-% IV SOLN
INTRAVENOUS | Status: DC | PRN
  Administered 2022-01-10: 50 ug/min via INTRAVENOUS

## 2022-01-10 MED ORDER — CHLORHEXIDINE GLUCONATE CLOTH 2 % EX PADS: 6.0000 | MEDICATED_PAD | Freq: Once | CUTANEOUS | Status: DC

## 2022-01-10 MED ORDER — KETAMINE HCL 10 MG/ML IJ SOLN
INTRAMUSCULAR | Status: DC | PRN
  Administered 2022-01-10: 20 mg via INTRAVENOUS

## 2022-01-10 MED ORDER — LACTATED RINGERS IV SOLN: INTRAVENOUS | Status: DC

## 2022-01-10 MED ORDER — SODIUM CHLORIDE 0.9 % IV SOLN: INTRAVENOUS | Status: DC | PRN

## 2022-01-10 MED ORDER — CHLORHEXIDINE GLUCONATE 0.12 % MT SOLN: 15.0000 mL | Freq: Once | OROMUCOSAL | Status: AC

## 2022-01-10 MED ORDER — MIDAZOLAM HCL 5 MG/5ML IJ SOLN
INTRAMUSCULAR | Status: DC | PRN
  Administered 2022-01-10 (×2): 1 mg via INTRAVENOUS

## 2022-01-10 MED ORDER — ROCURONIUM BROMIDE 100 MG/10ML IV SOLN
INTRAVENOUS | Status: DC | PRN
  Administered 2022-01-10: 70 mg via INTRAVENOUS
  Administered 2022-01-10: 10 mg via INTRAVENOUS

## 2022-01-10 MED ORDER — NOREPINEPHRINE 4 MG/250ML-% IV SOLN
0.0000 ug/min | INTRAVENOUS | Status: DC
  Administered 2022-01-10: 10 ug/min via INTRAVENOUS
  Administered 2022-01-10: 4 ug/min via INTRAVENOUS
  Administered 2022-01-11: 8 ug/min via INTRAVENOUS
  Administered 2022-01-11: 11 ug/min via INTRAVENOUS
  Administered 2022-01-11: 10 ug/min via INTRAVENOUS
  Administered 2022-01-12: 7 ug/min via INTRAVENOUS
  Administered 2022-01-12: 9 ug/min via INTRAVENOUS
  Filled 2022-01-10 (×9): qty 250

## 2022-01-10 MED ORDER — ORAL CARE MOUTH RINSE
15.0000 mL | Freq: Once | OROMUCOSAL | Status: AC
  Administered 2022-01-10: 15 mL via OROMUCOSAL

## 2022-01-10 MED ORDER — PHENYLEPHRINE HCL-NACL 20-0.9 MG/250ML-% IV SOLN
INTRAVENOUS | Status: AC
  Filled 2022-01-10: qty 250

## 2022-01-10 MED ORDER — PHENYLEPHRINE 80 MCG/ML (10ML) SYRINGE FOR IV PUSH (FOR BLOOD PRESSURE SUPPORT)
PREFILLED_SYRINGE | INTRAVENOUS | Status: DC | PRN
  Administered 2022-01-10: 80 ug via INTRAVENOUS
  Administered 2022-01-10 (×3): 160 ug via INTRAVENOUS
  Administered 2022-01-10: 80 ug via INTRAVENOUS

## 2022-01-10 MED ORDER — LIDOCAINE 2% (20 MG/ML) 5 ML SYRINGE
INTRAMUSCULAR | Status: DC | PRN
  Administered 2022-01-10: 80 mg via INTRAVENOUS

## 2022-01-10 MED ORDER — PROPOFOL 500 MG/50ML IV EMUL
INTRAVENOUS | Status: DC | PRN
  Administered 2022-01-10: 25 ug/kg/min via INTRAVENOUS

## 2022-01-10 MED ORDER — KETAMINE HCL 50 MG/5ML IJ SOSY
PREFILLED_SYRINGE | INTRAMUSCULAR | Status: AC
  Filled 2022-01-10: qty 5

## 2022-01-10 MED ORDER — ORAL CARE MOUTH RINSE: 15.0000 mL | OROMUCOSAL | Status: DC | PRN

## 2022-01-10 MED ORDER — METRONIDAZOLE 500 MG/100ML IV SOLN: 500.0000 mg | INTRAVENOUS | Status: DC

## 2022-01-10 MED ORDER — SODIUM CHLORIDE 0.9 % IV SOLN: INTRAVENOUS | Status: DC

## 2022-01-10 MED ORDER — MIDAZOLAM HCL 2 MG/2ML IJ SOLN
INTRAMUSCULAR | Status: AC
  Filled 2022-01-10: qty 2

## 2022-01-10 MED ORDER — PROPOFOL 1000 MG/100ML IV EMUL
5.0000 ug/kg/min | INTRAVENOUS | Status: DC
  Administered 2022-01-10: 25 ug/kg/min via INTRAVENOUS
  Administered 2022-01-11: 20 ug/kg/min via INTRAVENOUS
  Administered 2022-01-11 – 2022-01-12 (×2): 5 ug/kg/min via INTRAVENOUS
  Filled 2022-01-10 (×5): qty 100

## 2022-01-10 MED ORDER — FENTANYL CITRATE (PF) 100 MCG/2ML IJ SOLN
INTRAMUSCULAR | Status: AC
  Filled 2022-01-10: qty 2

## 2022-01-10 MED ORDER — SODIUM CHLORIDE 0.9 % IR SOLN
Status: DC | PRN
  Administered 2022-01-10 (×2): 1000 mL

## 2022-01-10 MED ORDER — ALBUMIN HUMAN 25 % IV SOLN
12.5000 g | INTRAVENOUS | Status: AC
  Administered 2022-01-10 – 2022-01-11 (×2): 12.5 g via INTRAVENOUS
  Filled 2022-01-10 (×2): qty 50

## 2022-01-10 SURGICAL SUPPLY — 39 items
APL PRP STRL LF DISP 70% ISPRP (MISCELLANEOUS) ×2
BIOPATCH BLUE 3/4IN DISK W/1.5 (GAUZE/BANDAGES/DRESSINGS) IMPLANT
CHLORAPREP W/TINT 26 (MISCELLANEOUS) ×2 IMPLANT
CLOTH BEACON ORANGE TIMEOUT ST (SAFETY) ×2 IMPLANT
COVER LIGHT HANDLE STERIS (MISCELLANEOUS) ×4 IMPLANT
DRAPE WARM FLUID 44X44 (DRAPES) ×2 IMPLANT
DRSG OPSITE POSTOP 4X8 (GAUZE/BANDAGES/DRESSINGS) IMPLANT
DRSG SORBAVIEW 2.5X4 SM (GAUZE/BANDAGES/DRESSINGS) IMPLANT
ELECT BLADE 6 FLAT ULTRCLN (ELECTRODE) IMPLANT
ELECT REM PT RETURN 9FT ADLT (ELECTROSURGICAL) ×2
ELECTRODE REM PT RTRN 9FT ADLT (ELECTROSURGICAL) ×2 IMPLANT
GLOVE BIOGEL PI IND STRL 6.5 (GLOVE) ×2 IMPLANT
GLOVE BIOGEL PI IND STRL 7.0 (GLOVE) ×4 IMPLANT
GLOVE BIOGEL PI INDICATOR 6.5 (GLOVE) ×2
GLOVE BIOGEL PI INDICATOR 7.0 (GLOVE) ×4
GLOVE INDICATOR 7.0 STRL GRN (GLOVE) IMPLANT
GOWN STRL REUS W/TWL LRG LVL3 (GOWN DISPOSABLE) ×6 IMPLANT
HANDLE SUCTION POOLE (INSTRUMENTS) ×2 IMPLANT
INST SET MAJOR GENERAL (KITS) ×2 IMPLANT
KIT CV MULTILUMEN 7FR 20 (SET/KITS/TRAYS/PACK) ×2
KIT CV MULTILUMEN 7FR 20 SUB (SET/KITS/TRAYS/PACK) IMPLANT
KIT TURNOVER KIT A (KITS) ×2 IMPLANT
MANIFOLD NEPTUNE II (INSTRUMENTS) ×2 IMPLANT
NDL HYPO 21X1.5 SAFETY (NEEDLE) ×2 IMPLANT
NEEDLE HYPO 21X1.5 SAFETY (NEEDLE) ×2 IMPLANT
NS IRRIG 1000ML POUR BTL (IV SOLUTION) ×4 IMPLANT
PACK MAJOR ABDOMINAL (CUSTOM PROCEDURE TRAY) ×2 IMPLANT
PAD ARMBOARD 7.5X6 YLW CONV (MISCELLANEOUS) ×2 IMPLANT
PENCIL SMOKE EVACUATOR COATED (MISCELLANEOUS) ×2 IMPLANT
RETRACTOR WND ALEXIS-O 25 LRG (MISCELLANEOUS) IMPLANT
RTRCTR WOUND ALEXIS O 25CM LRG (MISCELLANEOUS) ×2
SET BASIN LINEN APH (SET/KITS/TRAYS/PACK) ×2 IMPLANT
SPONGE T-LAP 18X18 ~~LOC~~+RFID (SPONGE) ×2 IMPLANT
STAPLER VISISTAT (STAPLE) ×2 IMPLANT
SUCTION POOLE HANDLE (INSTRUMENTS) ×4
SUT PDS AB CT VIOLET #0 27IN (SUTURE) ×4 IMPLANT
SUT SILK 3 0 SH CR/8 (SUTURE) ×2 IMPLANT
SYR 20ML LL LF (SYRINGE) ×4 IMPLANT
TRAY FOLEY MTR SLVR 16FR STAT (SET/KITS/TRAYS/PACK) ×2 IMPLANT

## 2022-01-10 NOTE — Progress Notes (Signed)
[facility]  Updated son. Bowel viable, and 1 band lysed. Staying intubated and CVL placed in RIJ after case as pressors were needed at end.  PRN for pain Propofol Hospitalist deciding about CC orders etc Respiratory for Vent Intubated CXR for intubation and CVL NPO, NG in place, awaiting bowel function Labs in AM SCDs Updated hospitalist and team   Curlene Labrum, MD [facility] [address] [place], [place] [address] [phone] (office)

## 2022-01-10 NOTE — Progress Notes (Signed)
PROGRESS NOTE    Patient: [name]                            PCP: Chevis Pretty, FNP                    DOB: 05-31-1945            DOA: 01/07/2022 WUG:[id number]             DOS: 01/10/2022, 9:45 AM   LOS: 3 days   Date of Service: The patient was seen and examined on 01/10/2022  Subjective:   Patient was seen and examined, Overnight had a CT scan of abdomen concerning for high-grade SBO, developing ischemic bowel mesenteric ....  Patient has been evaluated by Dr. Constance Haw, who is agreed to take the patient to the OR earlier this morning.  Noted for worsening leukocytosis, and worsening kidney function.  Brief Narrative:   [name] is a 76 y.o. female with medical history significant of 76 year old female past medical history significant for chronic obstructive pulmonary disease, hyperlipidemia, essential hypertension, hypothyroidism, pulmonary embolism in the past, osteoarthritis, and depression who presents from home with vomiting and diarrhea since Friday.   Presented to urgent care on Friday with acute onset of abdominal pain a CT scan was obtained and was unremarkable she received some IV fluids and antiemetics and was discharged home.  Her V and D persisted and that previous treatment did not alleviate her symptoms so she presented here with her son today.  He provided some information which was not available on Friday.  Apparently the patient had eaten some fruit that she had kept outside on a porch but some wild animal had gotten into the fruit she cut away the parts that had been nibbled on and ate the fruit on Thursday.  She has been sick since then.  ED:  CT scan was obtained and found to show a partial small bowel obstruction and significant changes compared to the CT scan she had on Friday.   ED Course: Patient was given an oral dose of Zofran and some IV fluid drip.  She continued to have persistent nausea in the emergency department and vomited at least 200 mL  during my exam.  She was given additional Zofran at that point in her IV.  Also had 1 episode of diarrhea while here in the emergency department which she describes as small. Labs are consistent with acute kidney injury and hypokalemia CT scan shows a partial small bowel obstruction.  I discussed the case with Dr. Doren Custard who has ordered an NG tube and communicated with surgery who will see the patient in a.m.   Assessment/Plan Principal Problem:   Partial small bowel obstruction (HCC) Active Problems:   COPD (chronic obstructive pulmonary disease) (HCC)   Hyperlipidemia   Hypothyroidism (acquired)   Essential hypertension   History of pulmonary embolus (PE)    Partial small bowel obstruction: -Concern for worsening high-grade small bowel obstruction, with marked leukocytosis, AKI  -CT abdomen pelvis was reviewed by general surgery Dr. Madelon Lips for worsening ischemic bowel, patient will be taken to the OR this morning 01/10/2022  -NG-tube in place,--a -C. difficile ordered discontinued -1 soft pasty stool 01/08/2022 -Patient will be kept n.p.o. -Due to hypotension and AKI, aggressive IV fluid will be initiated today  -Zofran IV or sublingually for nausea -Continue aggressive IV fluid resuscitation  Acute renal insufficiency versus ATN -Likely exacerbated due  to dehydration, SBO, hypertension (? CKD -creatinine 1.09 on 01/04/2022) -Progressive renal failure in the setting high-grade small bowel obstruction, possible ischemic bowel... No signs of renal artery obstructions UA possible UTI Lab Results  Component Value Date   CREATININE 4.82 (H) 01/10/2022   CREATININE 3.72 (H) 01/09/2022   CREATININE 2.83 (H) 01/08/2022   BUN 53, 62, 75>>> 94 -Monitoring, avoiding nephrotoxins --Consult nephrology: Appreciate input and close follow-up If needing RRT patient will be transferred to [facility]  COPD:  -Remained stable -Currently stable and well compensated.   -As needed  DuoNeb bronchodilators   Hyperlipidemia: Patient manages her hyperlipidemia with diet control.   Hypothyroidism: Continue home medications once can tolerate p.o.   Hypotensive Pressure as low as 76/61 this morning 90/56 -Continue aggressive IV fluid resuscitation, with holding BP meds  Essential hypertension:  -Hypotensive, we will holding home BP meds -  as needed hydralazine.  Remote history of pulmonary embolism: Noted.  Not currently anticoagulated as this was in the past.     ----------------------------------------------------------------------------------------------------------------------------------------------- Nutritional status:  The patient's BMI is: Body mass index is 29.52 kg/m. I agree with the assessment and plan as outlined below: Nutrition Status:         ------------------------------------------------------------------------------------------------------------------------------------------------  DVT prophylaxis:  SCD's Start: 01/10/22 0613 heparin injection 5,000 Units Start: 01/07/22 2200   Code Status:   Code Status: Full Code  Family Communication: Son present at bedside-updated The above findings and plan of care has been discussed with patient (and family)  in detail,  they expressed understanding and agreement of above. -Advance care planning has been discussed.   Admission status:   Status is: Inpatient Remains inpatient appropriate because: Need to be n.p.o. IV fluids, surgical evaluation     Procedures:   No admission procedures for hospital encounter.   Antimicrobials:  Anti-infectives (From admission, onward)    Start     Dose/Rate Route Frequency Ordered Stop   01/10/22 0700  ciprofloxacin (CIPRO) IVPB 400 mg        400 mg 200 mL/hr over 60 Minutes Intravenous On call to O.R. 01/10/22 0612 01/11/22 0559   01/10/22 0700  metroNIDAZOLE (FLAGYL) IVPB 500 mg        500 mg 100 mL/hr over 60 Minutes Intravenous On call to  O.R. 01/10/22 0612 01/11/22 0559   01/08/22 0830  Levofloxacin (LEVAQUIN) IVPB 250 mg  Status:  Discontinued        250 mg 50 mL/hr over 60 Minutes Intravenous Every 24 hours 01/08/22 0724 01/08/22 0727   01/08/22 0830  Levofloxacin (LEVAQUIN) IVPB 250 mg  Status:  Discontinued        250 mg 50 mL/hr over 60 Minutes Intravenous Every 48 hours 01/08/22 0727 01/08/22 0732   01/08/22 0830  [MAR Hold]  Levofloxacin (LEVAQUIN) IVPB 250 mg        (MAR Hold since Thu 01/10/2022 at 0728.Hold Reason: Transfer to a Procedural area)   250 mg 50 mL/hr over 60 Minutes Intravenous Every 24 hours 01/08/22 0732          Medication:   Chlorhexidine Gluconate Cloth  6 each Topical Once   [MAR Hold] heparin  5,000 Units Subcutaneous Q8H   [MAR Hold] pantoprazole (PROTONIX) IV  40 mg Intravenous Daily   [MAR Hold] sodium chloride flush  3 mL Intravenous Q12H    [MAR Hold] sodium chloride, [MAR Hold] alum & mag hydroxide-simeth, [MAR Hold] LORazepam, [MAR Hold]  morphine injection, [MAR Hold]  morphine injection, [DISCONTINUED] ondansetron **OR** [  MAR Hold] ondansetron (ZOFRAN) IV, [MAR Hold] sodium chloride flush   Objective:   Vitals:   01/09/22 1527 01/09/22 2145 01/10/22 0617 01/10/22 0750  BP: (!) 96/50 (!) 98/47 (!) 90/56   Pulse: 76 77 74   Resp:  18 18   Temp:  98.2 F (36.8 C) 98.1 F (36.7 C)   TempSrc:      SpO2:  92% 92%   Weight:    78 kg  Height:    '5\' 4"'$  (1.626 m)    Intake/Output Summary (Last 24 hours) at 01/10/2022 0945 Last data filed at 01/10/2022 0655 Gross per 24 hour  Intake 0 ml  Output 2700 ml  Net -2700 ml   Filed Weights   01/07/22 1138 01/07/22 2108 01/10/22 0750  Weight: 78.7 kg 78.7 kg 78 kg     Examination:       Physical Exam:   General:  Somnolent/sleepy  NG tube in place  AAO x 3,  cooperative, no distress;   HEENT:  Normocephalic, PERRL, otherwise with in Normal limits   Neuro:  CNII-XII intact. , normal motor and sensation, reflexes intact    Lungs:   Clear to auscultation BL, Respirations unlabored,  No wheezes / crackles  Cardio:    S1/S2, RRR, No murmure, No Rubs or Gallops   Abdomen:  Soft, diffuse tenderness bowel sounds active all four quadrants, no guarding or peritoneal signs.  Muscular  skeletal:  Limited exam -global generalized weaknesses - in bed, able to move all 4 extremities,   2+ pulses,  symmetric, No pitting edema  Skin:  Dry, warm to touch, negative for any Rashes,  Wounds: Please see nursing documentation           ------------------------------------------------------------------------------------------------------------------------------------------    LABs:     Latest Ref Rng & Units 01/10/2022    4:12 AM 01/08/2022    4:33 AM 01/07/2022    8:55 PM  CBC  WBC 4.0 - 10.5 K/uL 21.3  13.1  13.9   Hemoglobin 12.0 - 15.0 g/dL 11.1  11.7  11.5   Hematocrit 36.0 - 46.0 % 35.6  36.2  35.6   Platelets 150 - 400 K/uL 226  296  316       Latest Ref Rng & Units 01/10/2022    4:12 AM 01/09/2022    4:15 AM 01/08/2022   12:43 PM  CMP  Glucose 70 - 99 mg/dL 139  87  104   BUN 8 - 23 mg/dL 94  75  62   Creatinine 0.44 - 1.00 mg/dL 4.82  3.72  2.83   Sodium 135 - 145 mmol/L 135  145  143   Potassium 3.5 - 5.1 mmol/L 4.9  4.7  3.1   Chloride 98 - 111 mmol/L 99  105  99   CO2 22 - 32 mmol/L '26  25  29   '$ Calcium 8.9 - 10.3 mg/dL 7.7  8.0  7.7        Micro Results Recent Results (from the past 240 hour(s))  Urine Culture     Status: Abnormal   Collection Time: 01/07/22  2:55 PM   Specimen: Urine, Clean Catch  Result Value Ref Range Status   Specimen Description   Final    URINE, CLEAN CATCH Performed at [facility], [address] [place], [place] [address]    Special Requests   Final    NONE Performed at [facility], [address], [place], [place] [address]    Culture (  A)  Final    >=100,000 COLONIES/mL MULTIPLE SPECIES PRESENT, SUGGEST RECOLLECTION   Report Status 01/09/2022 FINAL   Final  Surgical pcr screen     Status: None   Collection Time: 01/10/22  6:20 AM   Specimen: Nasal Mucosa; Nasal Swab  Result Value Ref Range Status   MRSA, PCR NEGATIVE NEGATIVE Final   Staphylococcus aureus NEGATIVE NEGATIVE Final    Comment: (NOTE) The Xpert SA Assay (FDA approved for NASAL specimens in patients 36 years of age and older), is one component of a comprehensive surveillance program. It is not intended to diagnose infection nor to guide or monitor treatment. Performed at [facility], [address], [place], [place] [address]     Radiology Reports CT ABDOMEN PELVIS WO CONTRAST  Result Date: 01/09/2022 CLINICAL DATA:  Acute abdominal pain, concern for thickened bowel EXAM: CT ABDOMEN AND PELVIS WITHOUT CONTRAST TECHNIQUE: Multidetector CT imaging of the abdomen and pelvis was performed following the standard protocol without IV contrast. RADIATION DOSE REDUCTION: This exam was performed according to the departmental dose-optimization program which includes automated exposure control, adjustment of the mA and/or kV according to patient size and/or use of iterative reconstruction technique. COMPARISON:  CT abdomen and pelvis 01/07/2022 FINDINGS: Lower chest: Small right-greater-than-left pleural effusions and associated atelectasis/consolidation. Patchy infiltrates in both lower lung fields. Mild bronchial wall thickening. Hepatobiliary: No focal liver abnormality is seen. Status post cholecystectomy. No biliary dilatation. Mixed pancreatic atrophy. No peripancreatic inflammatory stranding or fluid. Pancreas: Unremarkable. Spleen: Normal in size without focal abnormality. Adrenals/Urinary Tract: Unremarkable adrenal glands. No hydronephrosis or urinary calculi. Nondistended bladder. Stomach/Bowel: Moderate to large fixed hiatal hernia. The stomach is distended with fluid mixed with oral contrast. Fluid within the lower thoracic esophagus. Dilation of the duodenum and jejunum with  abrupt transition point in the right lower quadrant (circa series 2/image 55 and series 5/image 55). The small bowel is dilated measuring up to 4.6 cm in diameter. Similar to increased diffuse small bowel wall thickening with engorgement of the mesenteric vasculature. Mesenteric edema and small amount of free fluid in the abdomen and pelvis. No definite pneumatosis. Vascular/Lymphatic: Mild aortic atherosclerotic calcification. No suspicious abdominopelvic lymphadenopathy. Reproductive: Status post hysterectomy. No adnexal masses. Other: No free intraperitoneal air. Musculoskeletal: Posterior lumbar fusion at L4-S1. No acute osseous abnormality. Body wall anasarca. IMPRESSION: Abnormal dilation of the small bowel with transition point in the right lower quadrant compatible with high-grade bowel obstruction. The bowel wall is markedly thickened with engorged mesenteric vasculature. Evaluation for ischemia is limited without IV contrast. No free intraperitoneal air. No pneumatosis. Small right-greater-than-left pleural effusions and associated atelectasis/consolidation. Pneumonia is difficult to exclude. Moderate to large fixed hiatal hernia. Electronically Signed   By: Placido Sou M.D.   On: 01/09/2022 21:53    SIGNED: Deatra James, MD, FHM. [facility],  Pager (please use [email or web address] to page/text) Please use Epic Secure Chat for non-urgent communication (7AM-7PM)  If 7PM-7AM, please contact night-coverage [email or web address], 01/10/2022, 9:45 AM

## 2022-01-10 NOTE — Consult Note (Signed)
[facility] Renal Consultation Note  Requesting MD: Shahmehdi Indication for Consultation: AKI  HPI:  [name] is a 76 y.o. female with past medical history significant for COPD, HTN and hyperlipidemia.  She was in her [place] until 8/11 presented to urgent care with abdominal pain/N/V -  CT negative- went home but came back on 8/14 to ER with same omplaints, CT then showed partial SBO-  got admitted, NGT placed but continued to have issue-  CT last night showed more high grade obstruction and possible ischemia so taken to OR today for management.    Kidney wise-  crt was 0.93 in April of 2023.  In May and June crt 1.4 to 1.6.  When went to urgent care on 8/11 crt was 1.09 but has progressively worsened since here, noted to be 4.8 this AM with BUN of 94-  hypokalemia had been an issue earlier in hosp but K this AM was 4.9.  UOP has not been well recorded - 300 overnight ? BP has been soft/ low really her entire hospital stay around 90 systolic.  No nephrotoxins identified given in hospital-  was on micardis and lasix PTA.  CT was with contrast on 8/11 but subsequent CTs have been without contrast.  Imaging is negative for hydro.  Urine on 8/14 looked like possible UTI with pyuria , hematuria and 100 of protein -- on 8/11 was fairly clear  Pt is in the OR this AM so I am unable to physically see-  no edema is noted in note by hospitalist or surgeon in recent notes  Creatinine, Ser  Date/Time Value Ref Range Status  01/10/2022 04:12 AM 4.82 (H) 0.44 - 1.00 mg/dL Final  01/09/2022 04:15 AM 3.72 (H) 0.44 - 1.00 mg/dL Final  01/08/2022 12:43 PM 2.83 (H) 0.44 - 1.00 mg/dL Final  01/08/2022 04:33 AM 2.17 (H) 0.44 - 1.00 mg/dL Final  01/07/2022 08:55 PM 1.82 (H) 0.44 - 1.00 mg/dL Final  01/07/2022 12:27 PM 1.84 (H) 0.44 - 1.00 mg/dL Final  01/04/2022 03:16 PM 1.09 (H) 0.44 - 1.00 mg/dL Final  10/30/2021 03:44 PM 1.60 (H) 0.57 - 1.00 mg/dL Final  10/23/2021 02:26 PM 1.69 (H) 0.57 - 1.00  mg/dL Final  09/28/2021 12:01 PM 1.43 (H) 0.57 - 1.00 mg/dL Final  09/10/2021 02:20 PM 0.93 0.57 - 1.00 mg/dL Final  03/01/2021 01:07 PM 0.96 0.57 - 1.00 mg/dL Final  02/25/2020 03:46 PM 1.23 (H) 0.57 - 1.00 mg/dL Final  08/31/2019 12:25 PM 1.32 (H) 0.57 - 1.00 mg/dL Final  06/01/2019 03:17 PM 1.04 (H) 0.57 - 1.00 mg/dL Final  05/25/2019 10:59 AM 1.50 (H) 0.57 - 1.00 mg/dL Final  02/04/2019 04:05 PM 1.59 (H) 0.57 - 1.00 mg/dL Final  08/07/2018 04:16 PM WILL FOLLOW  Preliminary  08/07/2018 12:35 PM 1.29 (H) 0.57 - 1.00 mg/dL Final  06/30/2018 12:00 AM 2.64 (H) 0.57 - 1.00 mg/dL Final  05/19/2018 02:31 PM 1.04 (H) 0.44 - 1.00 mg/dL Final  10/01/2017 11:50 AM 1.11 (H) 0.57 - 1.00 mg/dL Final  08/11/2017 12:35 PM 1.62 (H) 0.57 - 1.00 mg/dL Final  07/30/2017 01:43 PM 1.14 (H) 0.57 - 1.00 mg/dL Final  02/17/2017 03:08 PM 0.88 0.57 - 1.00 mg/dL Final  07/09/2016 12:04 PM 1.05 (H) 0.57 - 1.00 mg/dL Final  01/18/2016 01:03 PM 1.23 (H) 0.57 - 1.00 mg/dL Final  10/27/2013 11:35 AM 0.99 0.50 - 1.10 mg/dL Final  12/21/2012 03:47 PM 0.87 0.50 - 1.10 mg/dL Final     PMHx:  Past Medical History:  Diagnosis Date   Allergy    Bronchitis, chronic (HCC)    Chronic anxiety    Complication of anesthesia    hard to wake up   COPD (chronic obstructive pulmonary disease) (HCC)    Depression    Esophageal reflux    Headache(784.0)    Hyperlipidemia    Hypertension    Hypothyroidism    Obesity    Osteoporosis    Overactive bladder    PVC's (premature ventricular contractions)    Thyroid disease    Vitamin D deficiency disease     Past Surgical History:  Procedure Laterality Date   ABDOMINAL HYSTERECTOMY     CHOLECYSTECTOMY N/A 11/04/2013   Procedure: LAPAROSCOPIC CHOLECYSTECTOMY WITH INTRAOPERATIVE CHOLANGIOGRAM;  Surgeon: Imogene Burn. Georgette Dover, MD;  Location: [facility];  Service: General;  Laterality: N/A;   COLONOSCOPY     FRACTURE SURGERY     pins removed from Hip surgery   HIP FRACTURE  SURGERY  1990    pins removed in [facility]      Family Hx:  Family History  Problem Relation Age of Onset   Arthritis Other    Heart disease Other    Cancer Other    Diabetes Other    OCD Other     Social History:  reports that she has never smoked. She has never used smokeless tobacco. She reports that she does not drink alcohol and does not use drugs.  Allergies:  Allergies  Allergen Reactions   Celebrex [Celecoxib] Swelling   Keflet [Cephalexin] Swelling   Penicillins Hives and Swelling    DID THE REACTION INVOLVE: Swelling of the face/tongue/throat, SOB, or low BP? Yes-swelling-hives Sudden or severe rash/hives, skin peeling, or the inside of the mouth or nose? Unknown Did it require medical treatment? Unknown When did it last happen?    over 10 years   If all above answers are "NO", may proceed with cephalosporin use.    Sulfa Antibiotics Swelling   Kenalog [Triamcinolone Acetonide]     unknown   Latex    Lisinopril Other (See Comments)    unknown   Mobic [Meloxicam]     SWELLING    Medications: Prior to Admission medications   Medication Sig Start Date End Date Taking? Authorizing Provider  acetaminophen (TYLENOL) 650 MG CR tablet Take 650 mg by mouth every 8 (eight) hours as needed for pain.   Yes [provider]  ALPRAZolam (XANAX) 0.5 MG tablet Take 1 tablet (0.5 mg total) by mouth 2 (two) times daily as needed for anxiety (use SPARINGLY). 10/30/21  Yes Martin, Mary-Margaret, FNP  calcium carbonate (OS-CAL - DOSED IN MG OF ELEMENTAL CALCIUM) 1250 (500 Ca) MG tablet Take 1 tablet by mouth once a week.    Yes [provider]  Cholecalciferol (VITAMIN D3) 10 MCG (400 UNIT) CAPS Take 1 capsule by mouth daily.   Yes [provider]  escitalopram (LEXAPRO) 10 MG tablet Take 1 tablet (10 mg total) by mouth daily. 10/30/21  Yes Hassell Done, Mary-Margaret, FNP  furosemide  (LASIX) 20 MG tablet Take 1 tablet (20 mg total) by mouth every Monday, Wednesday, and Friday. May take additional 20 mg for swelling 12/05/21 05/22/22 Yes Branch, Alphonse Guild, MD  levothyroxine (SYNTHROID) 50 MCG tablet TAKE ONE (1) TABLET EACH DAY Patient taking differently: Take 50 mcg by mouth daily before breakfast. 10/30/21  Yes Hassell Done, Mary-Margaret,  FNP  Multiple Vitamin (MULTIVITAMIN) tablet Take 1 tablet by mouth 2 (two) times a week.    Yes [provider]  omeprazole-sodium bicarbonate (ZEGERID) 40-1100 MG capsule Take 1 capsule by mouth daily before breakfast. 10/30/21  Yes Hassell Done, Mary-Margaret, FNP  ondansetron (ZOFRAN-ODT) 4 MG disintegrating tablet Take 1 tablet (4 mg total) by mouth every 8 (eight) hours as needed for nausea or vomiting. 01/04/22  Yes Fredia Sorrow, MD  telmisartan (MICARDIS) 80 MG tablet Take 1 tablet (80 mg total) by mouth daily. 09/28/21  Yes Hassell Done, Mary-Margaret, FNP    I have reviewed the patient's current medications.  Labs:  Results for orders placed or performed during the hospital encounter of 01/07/22 (from the past 48 hour(s))  Basic metabolic panel     Status: Abnormal   Collection Time: 01/08/22 12:43 PM  Result Value Ref Range   Sodium 143 135 - 145 mmol/L   Potassium 3.1 (L) 3.5 - 5.1 mmol/L   Chloride 99 98 - 111 mmol/L   CO2 29 22 - 32 mmol/L   Glucose, Bld 104 (H) 70 - 99 mg/dL    Comment: Glucose reference range applies only to samples taken after fasting for at least 8 hours.   BUN 62 (H) 8 - 23 mg/dL   Creatinine, Ser 2.83 (H) 0.44 - 1.00 mg/dL   Calcium 7.7 (L) 8.9 - 10.3 mg/dL   GFR, Estimated 17 (L) >60 mL/min    Comment: (NOTE) Calculated using the CKD-EPI Creatinine Equation (2021)    Anion gap 15 5 - 15    Comment: Performed at [facility], [address], [place], [place] [address]  Basic metabolic panel     Status: Abnormal   Collection Time: 01/09/22  4:15 AM  Result Value Ref Range   Sodium 145 135 - 145 mmol/L    Potassium 4.7 3.5 - 5.1 mmol/L    Comment: DELTA CHECK NOTED   Chloride 105 98 - 111 mmol/L   CO2 25 22 - 32 mmol/L   Glucose, Bld 87 70 - 99 mg/dL    Comment: Glucose reference range applies only to samples taken after fasting for at least 8 hours.   BUN 75 (H) 8 - 23 mg/dL   Creatinine, Ser 3.72 (H) 0.44 - 1.00 mg/dL   Calcium 8.0 (L) 8.9 - 10.3 mg/dL   GFR, Estimated 12 (L) >60 mL/min    Comment: (NOTE) Calculated using the CKD-EPI Creatinine Equation (2021)    Anion gap 15 5 - 15    Comment: Performed at [facility], [address], [place], [place] [address]  CBC     Status: Abnormal   Collection Time: 01/10/22  4:12 AM  Result Value Ref Range   WBC 21.3 (H) 4.0 - 10.5 K/uL   RBC 3.95 3.87 - 5.11 MIL/uL   Hemoglobin 11.1 (L) 12.0 - 15.0 g/dL   HCT 35.6 (L) 36.0 - 46.0 %   MCV 90.1 80.0 - 100.0 fL   MCH 28.1 26.0 - 34.0 pg   MCHC 31.2 30.0 - 36.0 g/dL   RDW 17.2 (H) 11.5 - 15.5 %   Platelets 226 150 - 400 K/uL   nRBC 0.5 (H) 0.0 - 0.2 %    Comment: Performed at [facility], [address] [place], [place] [address]  Basic metabolic panel     Status: Abnormal   Collection Time: 01/10/22  4:12 AM  Result Value Ref Range   Sodium 135 135 - 145 mmol/L  Comment: DELTA CHECK NOTED   Potassium 4.9 3.5 - 5.1 mmol/L   Chloride 99 98 - 111 mmol/L   CO2 26 22 - 32 mmol/L   Glucose, Bld 139 (H) 70 - 99 mg/dL    Comment: Glucose reference range applies only to samples taken after fasting for at least 8 hours.   BUN 94 (H) 8 - 23 mg/dL   Creatinine, Ser 4.82 (H) 0.44 - 1.00 mg/dL   Calcium 7.7 (L) 8.9 - 10.3 mg/dL   GFR, Estimated 9 (L) >60 mL/min    Comment: (NOTE) Calculated using the CKD-EPI Creatinine Equation (2021)    Anion gap 10 5 - 15    Comment: Performed at [facility], [address], [place], [place] [address]  ABO/Rh     Status: None   Collection Time: 01/10/22  4:12 AM  Result Value Ref Range   ABO/RH(D)      O POS Performed at [facility], [address] [place], [place] [address]   Type and screen [facility]     Status: None   Collection Time: 01/10/22  6:26 AM  Result Value Ref Range   ABO/RH(D) O POS    Antibody Screen NEG    Sample Expiration      01/13/2022,2359 Performed at [facility], [address], [place], [place] [address]      ROS:  Review of systems not obtained due to patient factors.  Physical Exam: Vitals:   01/09/22 2145 01/10/22 0617  BP: (!) 98/47 (!) 90/56  Pulse: 77 74  Resp: 18 18  Temp: 98.2 F (36.8 C) 98.1 F (36.7 C)  SpO2: 92% 92%     General:   PE not performed as patient in the OR from the time I got consult  HEENT: Eyes: Neck: Heart: Lungs: Abdomen: Extremities: Skin: Neuro:  Assessment/Plan: 76 year old WF without significant baseline renal disease-  has developed AKI in the setting of SBO, possible ischemic bowel   1.Renal- crt on 8/11 1.09-  but now with progressive AKI in the setting of initially partial SBO- then progressed to high grade with possible ischemic bowel.  I suspect ATN as BP has been low and she was on micardis PTA.  Imaging without obstruction/U/A showing possible UTI but has been on abx.  BP has continued to be low off of BP meds so insult has continued.  Is not oliguric it seems.  There are no acute indications for dialysis by this AMs labs but trend is very concerning.  I am afraid if she did need RRT it would need to be CRRT.  However, we are hopeful that intervention today may be the start to BP and clinical status moving in the right direction  2. Hypertension/volume  - has had soft/low BP really since she has been here on no BP meds-  getting IVF but unclear if dry or hypotension is due to something else-  would consider pressor support to keep SBP at least above 110 4. Anemia  - not a major issue so far   Louis Meckel 01/10/2022, 8:32 AM

## 2022-01-10 NOTE — Progress Notes (Addendum)
Hypoglycemic Event  CBG: 24 finger check, 67 via blood from CVC line  Treatment: D50 50 mL (25 gm)  Symptoms:  Unknown due to patient on ventilator  Follow-up CBG: Time: 1723 CBG Result: 111  Possible Reasons for Event: Other: patient just had surgery/ventilator/NPO  Comments/MD notified:Dr Shahmehdi and Dr Melvyn Novas  IV fluids changed to D5NS '@125'$  ordered and started per verbal from Dr Melvyn Novas   Raj Janus

## 2022-01-10 NOTE — Progress Notes (Signed)
PROGRESS NOTE    Patient: [name]                            PCP: Chevis Pretty, FNP                    DOB: November 27, 1945            DOA: 01/07/2022 UYQ:[id number]             DOS: 01/10/2022, 2:29 PM   LOS: 3 days   Date of Service: The patient was seen and examined on 01/10/2022  Subjective: Postop evaluation   The patient was seen and examined postop.... She remained intubated, sedated, chest x-rays were obtained, left lower lobe infiltrate was noted  Patient remain on the vent,  IV bolus lactated Ringer 2 L was given intraoperatively Monitoring for I's and O's Upon arrival CBG was 58, D50 25 mL was given, CBG improved to 95   Currently stable on the vent intubated, on propofol sedated, Right IJ in place pressors initiated  Transferred to ICU-PCCM Dr. Melvyn Novas consulted  Monitoring labs... Kidney function, I's and O's, Foley catheter was placed     Brief Narrative:   [name] is a 76 y.o. female with medical history significant of 76 year old female past medical history significant for chronic obstructive pulmonary disease, hyperlipidemia, essential hypertension, hypothyroidism, pulmonary embolism in the past, osteoarthritis, and depression who presents from home with vomiting and diarrhea since Friday.   Presented to urgent care on Friday with acute onset of abdominal pain a CT scan was obtained and was unremarkable she received some IV fluids and antiemetics and was discharged home.  Her V and D persisted and that previous treatment did not alleviate her symptoms so she presented here with her son today.  He provided some information which was not available on Friday.  Apparently the patient had eaten some fruit that she had kept outside on a porch but some wild animal had gotten into the fruit she cut away the parts that had been nibbled on and ate the fruit on Thursday.  She has been sick since then.  ED:  CT scan was obtained and found to show a partial small  bowel obstruction and significant changes compared to the CT scan she had on Friday.   ED Course: Patient was given an oral dose of Zofran and some IV fluid drip.  She continued to have persistent nausea in the emergency department and vomited at least 200 mL during my exam.  She was given additional Zofran at that point in her IV.  Also had 1 episode of diarrhea while here in the emergency department which she describes as small. Labs are consistent with acute kidney injury and hypokalemia CT scan shows a partial small bowel obstruction.  I discussed the case with Dr. Doren Custard who has ordered an NG tube and communicated with surgery who will see the patient in a.m.   Assessment/Plan Principal Problem:  Small bowel obstruction (HCC) Active Problems:   COPD (chronic obstructive pulmonary disease) (HCC)   Hyperlipidemia   Hypothyroidism (acquired)   Essential hypertension   History of pulmonary embolus (PE)    Partial small bowel obstruction: -Concern for worsening high-grade small bowel obstruction, with marked leukocytosis, AKI  -CT abdomen pelvis was reviewed by general surgery Dr. Madelon Lips for worsening ischemic bowel, patient will be taken to the OR this morning 01/10/2022  Postop day 0 Tolerated procedure Intraoperatively  received 2 L of LR Postop remain intubated transferred to ICU Intubated, sedated on pressors Monitoring I's and O's   - Acute renal insufficiency versus ATN -Likely exacerbated due to dehydration, SBO, hypertension (? CKD -creatinine 1.09 on 01/04/2022) -Progressive renal failure in the setting high-grade small bowel obstruction, possible ischemic bowel... No signs of renal artery obstructions UA possible UTI Lab Results  Component Value Date   CREATININE 4.66 (H) 01/10/2022   CREATININE 4.82 (H) 01/10/2022   CREATININE 3.72 (H) 01/09/2022     BUN 53, 62, 75>>> 94 -Monitoring, avoiding nephrotoxins --Consult nephrology: Appreciate input and  close follow-up If needing RRT patient will be transferred to [facility]     COPD: -Intubated postop, as needed DuoNeb   Hyperlipidemia: Diet controlled   Hypothyroidism: Continue home medications once can tolerate p.o.   Hypotensive -Currently on pressors-Levophed postop  Essential hypertension: Discontinue BP meds        ----------------------------------------------------------------------------------------------------------------------------------------------- Cultures; Blood Cultures x 2 >> NGT -------------------------------------------------------------------------------------------------------------------------------------  DVT prophylaxis:  heparin injection 5,000 Units Start: 01/07/22 2200   Code Status:   Code Status: Full Code  Family Communication: Family at bedside updated The above findings and plan of care has been discussed with patient (and family)  in detail,  they expressed understanding and agreement of above. -Advance care planning has been discussed.     Procedures:   No admission procedures for hospital encounter.    Medication:   Chlorhexidine Gluconate Cloth  6 each Topical Daily   heparin  5,000 Units Subcutaneous Q8H   pantoprazole (PROTONIX) IV  40 mg Intravenous Daily   sodium chloride flush  3 mL Intravenous Q12H    sodium chloride, Place/Maintain arterial line **AND** sodium chloride, alum & mag hydroxide-simeth, LORazepam, morphine injection, morphine injection, [DISCONTINUED] ondansetron **OR** ondansetron (ZOFRAN) IV, sodium chloride flush   Objective:   Vitals:   01/10/22 1145 01/10/22 1200 01/10/22 1300 01/10/22 1400  BP: (!) 152/62 135/63 (!) 116/54 (!) 85/44  Pulse:  72  71  Resp: '18 18 19 18  '$ Temp:      TempSrc:      SpO2:  98%  100%  Weight:      Height:        Intake/Output Summary (Last 24 hours) at 01/10/2022 1429 Last data filed at 01/10/2022 1141 Gross per 24 hour  Intake 1900 ml  Output 3045 ml  Net  -1145 ml   Filed Weights   01/07/22 2108 01/10/22 0750 01/10/22 1131  Weight: 78.7 kg 78 kg 78.8 kg     Examination:   Physical Exam  Constitution: Intubated -sedated  HEENT:     ET tube in place, intubated, normocephalic, PERRL, otherwise with in Normal limits  Chest:         Chest symmetric Cardio vascular:  S1/S2, RRR, No murmure, No Rubs or Gallops  pulmonary: Intubated-on the vent  Left lower lobe rhonchi respirations unlabored, negative wheezes / crackles Abdomen: Surgical wound-dressing in place  soft, non-distended, hypoactive bowel sounds  muscular skeletal: Limited exam -sedated intubated, negative for any edema  Neuro: Sedated on propofol Extremities: No pitting edema lower extremities, +2 pulses  Skin: Dry, warm to touch, negative for any Rashes, No open wounds Wounds: Abdominal surgical wound dressing in place   ------------------------------------------------------------------------------------------------------------------------------------------    LABs:     Latest Ref Rng & Units 01/10/2022    4:12 AM 01/08/2022    4:33 AM 01/07/2022    8:55 PM  CBC  WBC 4.0 - 10.5  K/uL 21.3  13.1  13.9   Hemoglobin 12.0 - 15.0 g/dL 11.1  11.7  11.5   Hematocrit 36.0 - 46.0 % 35.6  36.2  35.6   Platelets 150 - 400 K/uL 226  296  316       Latest Ref Rng & Units 01/10/2022   12:55 PM 01/10/2022    4:12 AM 01/09/2022    4:15 AM  CMP  Glucose 70 - 99 mg/dL 122  139  87   BUN 8 - 23 mg/dL 87  94  75   Creatinine 0.44 - 1.00 mg/dL 4.66  4.82  3.72   Sodium 135 - 145 mmol/L 136  135  145   Potassium 3.5 - 5.1 mmol/L 4.3  4.9  4.7   Chloride 98 - 111 mmol/L 103  99  105   CO2 22 - 32 mmol/L '24  26  25   '$ Calcium 8.9 - 10.3 mg/dL 7.3  7.7  8.0        Radiology Reports DG Chest Port 1V same Day  Result Date: 01/10/2022 CLINICAL DATA:  Line adjustment EXAM: PORTABLE CHEST 1 VIEW COMPARISON:  Same day chest radiograph at 1156 hours FINDINGS: Interval retraction of the  endotracheal tube which is now within the midthoracic trachea at the level of the aortic knob approximally 4.5 cm from the carina. Right IJ central venous catheter with tip in the right atrium. Nasogastric tube courses below the diaphragm with tip obscured by collimation. The heart size and mediastinal contours unchanged. Aortic atherosclerosis. Small bilateral pleural effusions with adjacent airspace opacities. IMPRESSION: Interval retraction of the endotracheal tube which is now in the midthoracic trachea 4.5 cm from the carina. Electronically Signed   By: Dahlia Bailiff M.D.   On: 01/10/2022 13:24   DG Chest Port 1 View  Result Date: 01/10/2022 CLINICAL DATA:  Evaluate ET tube and NG tube placement. EXAM: PORTABLE CHEST 1 VIEW COMPARISON:  01/09/22 FINDINGS: The tip of the endotracheal tube is in the proximal right mainstem bronchus. Consider withdrawing by approximately 2 cm. The enteric tube tip and side port are below the GE junction. The enteric tube tip projects over the distal stomach. Right IJ catheter tip is in the inferior right atrium. No pneumothorax identified after catheter placement. There are small bilateral pleural effusions. The lung volumes are low and there is progressive atelectasis and/or consolidation involving the left lower lung. IMPRESSION: 1. ET tube tip is in the proximal right mainstem bronchus. Satisfactory position of the right IJ catheter and enteric tube. 2. Low lung volumes with progressive atelectasis and/or consolidation involving the left lower lung. 3. Small bilateral pleural effusions. These results will be called to the ordering clinician or representative by the Radiologist Assistant, and communication documented in the PACS or [employer]. Electronically Signed   By: Kerby Moors M.D.   On: 01/10/2022 12:07   CT ABDOMEN PELVIS WO CONTRAST  Result Date: 01/09/2022 CLINICAL DATA:  Acute abdominal pain, concern for thickened bowel EXAM: CT ABDOMEN AND PELVIS  WITHOUT CONTRAST TECHNIQUE: Multidetector CT imaging of the abdomen and pelvis was performed following the standard protocol without IV contrast. RADIATION DOSE REDUCTION: This exam was performed according to the departmental dose-optimization program which includes automated exposure control, adjustment of the mA and/or kV according to patient size and/or use of iterative reconstruction technique. COMPARISON:  CT abdomen and pelvis 01/07/2022 FINDINGS: Lower chest: Small right-greater-than-left pleural effusions and associated atelectasis/consolidation. Patchy infiltrates in both lower lung fields. Mild bronchial  wall thickening. Hepatobiliary: No focal liver abnormality is seen. Status post cholecystectomy. No biliary dilatation. Mixed pancreatic atrophy. No peripancreatic inflammatory stranding or fluid. Pancreas: Unremarkable. Spleen: Normal in size without focal abnormality. Adrenals/Urinary Tract: Unremarkable adrenal glands. No hydronephrosis or urinary calculi. Nondistended bladder. Stomach/Bowel: Moderate to large fixed hiatal hernia. The stomach is distended with fluid mixed with oral contrast. Fluid within the lower thoracic esophagus. Dilation of the duodenum and jejunum with abrupt transition point in the right lower quadrant (circa series 2/image 55 and series 5/image 55). The small bowel is dilated measuring up to 4.6 cm in diameter. Similar to increased diffuse small bowel wall thickening with engorgement of the mesenteric vasculature. Mesenteric edema and small amount of free fluid in the abdomen and pelvis. No definite pneumatosis. Vascular/Lymphatic: Mild aortic atherosclerotic calcification. No suspicious abdominopelvic lymphadenopathy. Reproductive: Status post hysterectomy. No adnexal masses. Other: No free intraperitoneal air. Musculoskeletal: Posterior lumbar fusion at L4-S1. No acute osseous abnormality. Body wall anasarca. IMPRESSION: Abnormal dilation of the small bowel with transition  point in the right lower quadrant compatible with high-grade bowel obstruction. The bowel wall is markedly thickened with engorged mesenteric vasculature. Evaluation for ischemia is limited without IV contrast. No free intraperitoneal air. No pneumatosis. Small right-greater-than-left pleural effusions and associated atelectasis/consolidation. Pneumonia is difficult to exclude. Moderate to large fixed hiatal hernia. Electronically Signed   By: Placido Sou M.D.   On: 01/09/2022 21:53    SIGNED: Deatra James, MD, FHM. > 66 minutes of critical care time was spent reviewing all labs, postop findings imaging, plan of care discussion with consulting general surgery PCCM  [facility],  Pager (please use [email or web address] to page/text) Please use Epic Secure Chat for non-urgent communication (7AM-7PM)  If 7PM-7AM, please contact night-coverage [email or web address], 01/10/2022, 2:29 PM

## 2022-01-10 NOTE — Final Consult Note (Addendum)
NAME:  [name], MRN:  [id number], DOB:  August 29, 1945, LOS: 3 ADMISSION DATE:  01/07/2022, CONSULTATION DATE:  8/17 REFERRING MD:  Rosie Fate, CHIEF COMPLAINT:  post op circulatory shock/ resp failure    History of Present Illness:  36 yowf never smoker with "copd"(not supported by pfts)   hyperlipidemia, essential hypertension, hypothyroidism, pulmonary embolism in the past, osteoarthritis, and depression who presents from home with vomiting and diarrhea since 8/11 when  presented with  acute onset of abdominal pain CT scan was obtained and was unremarkable she received some IV fluids and antiemetics and was discharged home.  Her V and D persisted and when  previous treatment did not alleviate her symptoms   she presented to [facility] with    CT scan was obtained and found to show a partial small bowel obstruction and significant changes compared to the CT scan she had on 8/11    ED Course: Patient was given an oral dose of Zofran and some IV fluid drip.  She continued to have persistent nausea in the emergency department and vomited at least 200 mL  witnessed  She was given additional Zofran at that point in her IV.  Also had 1 episode of diarrhea while here in the emergency department which is described as small vol. Labs were consistent with acute kidney injury and hypokalemia CT scan showeda partial small bowel obstruction.     To OR 68/17 Exploratory laparotomy lysis of adhesion and remained on vent/ on pressors post op and PCCM service consulted        Significant Hospital Events: Including procedures, antibiotic start and stop dates in addition to other pertinent events   OR  8/17  Exploratory laparotomy lysis of adhesion  ET   8/17    >>> R I J   8/17 >>>    Scheduled Meds:  Chlorhexidine Gluconate Cloth  6 each Topical Daily   heparin  5,000 Units Subcutaneous Q8H   pantoprazole (PROTONIX) IV  40 mg Intravenous Daily   sodium chloride flush  3 mL Intravenous Q12H    Continuous Infusions:  sodium chloride     sodium chloride     sodium chloride     sodium chloride 125 mL/hr at 01/10/22 1331   lactated ringers     levofloxacin (LEVAQUIN) IV     norepinephrine (LEVOPHED) Adult infusion 12 mcg/min (01/10/22 1109)   propofol (DIPRIVAN) infusion 25 mcg/kg/min (01/10/22 1210)   PRN Meds:.sodium chloride, Place/Maintain arterial line **AND** sodium chloride, alum & mag hydroxide-simeth, LORazepam, morphine injection, morphine injection, [DISCONTINUED] ondansetron **OR** ondansetron (ZOFRAN) IV, sodium chloride flush    Interim History / Subjective:  Sedated on vent but does open eyes to verbal, breathing just 1-2 over backup rate   Objective   Blood pressure (!) 85/44, pulse 71, temperature (!) 97.5 F (36.4 C), temperature source Axillary, resp. rate 18, height '5\' 4"'$  (1.626 m), weight 78.8 kg, SpO2 100 %. CVP:  [0 mmHg-15 mmHg] 15 mmHg  Vent Mode: PRVC FiO2 (%):  [100 %] 100 % Set Rate:  [18 bmp] 18 bmp Vt Set:  [440 mL] 440 mL PEEP:  [5 cmH20] 5 cmH20 Plateau Pressure:  [21 cmH20] 21 cmH20   Intake/Output Summary (Last 24 hours) at 01/10/2022 1418 Last data filed at 01/10/2022 1141 Gross per 24 hour  Intake 1900 ml  Output 3045 ml  Net -1145 ml   Filed Weights   01/07/22 2108 01/10/22 0750 01/10/22 1131  Weight: 78.7 kg 78 kg 78.8  kg   CVP:  [0 mmHg-15 mmHg] 15 mmHg    Examination:  Tmax:  98.2 General appearance:    elderly wf sedated on vent    No jvd Oropharynx et in place  Neck supple Lungs with a few scattered exp > insp rhonchi bilaterally RRR no s3 or or sign murmur Abd mod distended / limited  excursion  Extr relatively warm with trace edema  Neuro  Sensorium sedated ,  no apparent motor deficits    I personally reviewed images and agree with radiology impression as follows:  CXR:   portable 01/10/22  Et / cvl ok / mild effusions/atx bases      Assessment & Plan:  1)  Septic shock s/p lap for SBO with acceptable  cvp p fluid rescussitation - ? At risk of compartment syndrome (Dr Constance Haw says unlikely)  >>> rx levophed to mean systemic bp  goal of > 60  and add vasopressin 0.03 if need more than 10 mcg of levophed   2)  Vent dep respiratory post op   - no evidence ards yet, main challenge is third spacing   3) AKI on ARB prior to admit - oliguric at this point but probably not anuric (would suggest cortical necrosis in this setting if evolving towards it)  either way will likely need HD or CVVH Lab Results  Component Value Date   CREATININE 4.66 (H) 01/10/2022   CREATININE 4.82 (H) 01/10/2022   CREATININE 3.72 (H) 01/09/2022   >>> keep on wet side  as long as gas echange favorable by monitoring cvp    Best Practice (right click and "Reselect all SmartList Selections" daily)   Per [facility]   CBC: Recent Labs  Lab 01/04/22 1516 01/07/22 1227 01/07/22 2055 01/08/22 0433 01/10/22 0412  WBC 15.1* 9.8 13.9* 13.1* 21.3*  HGB 12.0 11.5* 11.5* 11.7* 11.1*  HCT 36.7 36.0 35.6* 36.2 35.6*  MCV 85.9 87.0 87.5 88.3 90.1  PLT 455* 350 316 296 828    Basic Metabolic Panel: Recent Labs  Lab 01/07/22 1227 01/07/22 2055 01/08/22 0433 01/08/22 1243 01/09/22 0415 01/10/22 0412 01/10/22 1255  NA 139  --  145 143 145 135 136  K 2.7*  --  2.7* 3.1* 4.7 4.9 4.3  CL 96*  --  101 99 105 99 103  CO2 31  --  '31 29 25 26 24  '$ GLUCOSE 147*  --  105* 104* 87 139* 122*  BUN 42*  --  53* 62* 75* 94* 87*  CREATININE 1.84*   < > 2.17* 2.83* 3.72* 4.82* 4.66*  CALCIUM 9.2  --  8.1* 7.7* 8.0* 7.7* 7.3*  MG 2.3  --  2.4  --   --   --   --   PHOS  --   --  4.8*  --   --   --  3.4   < > = values in this interval not displayed.   GFR: Estimated Creatinine Clearance: 10.6 mL/min (A) (by C-G formula based on SCr of 4.66 mg/dL (H)). Recent Labs  Lab 01/04/22 2026 01/04/22 2220 01/07/22 1227 01/07/22 2055 01/08/22 0433 01/10/22 0412 01/10/22 1255  WBC  --   --  9.8 13.9* 13.1* 21.3*  --    LATICACIDVEN 1.2 1.1  --   --   --   --  2.0*    Liver Function Tests: Recent Labs  Lab 01/04/22 1516 01/07/22 1227 01/10/22 1255  AST 15 29  --   ALT  8 17  --   ALKPHOS 127* 97  --   BILITOT 0.6 1.2  --   PROT 7.8 7.5  --   ALBUMIN 4.3 4.0 2.4*   Recent Labs  Lab 01/04/22 1516 01/07/22 1227  LIPASE 23 23   No results for input(s): "AMMONIA" in the last 168 hours.  ABG No results found for: "PHART", "PCO2ART", "PO2ART", "HCO3", "TCO2", "ACIDBASEDEF", "O2SAT"   Coagulation Profile: No results for input(s): "INR", "PROTIME" in the last 168 hours.  Cardiac Enzymes: No results for input(s): "CKTOTAL", "CKMB", "CKMBINDEX", "TROPONINI" in the last 168 hours.  HbA1C: No results found for: "HGBA1C"  CBG: Recent Labs  Lab 01/10/22 1153 01/10/22 1222  GLUCAP 58* 95      Past Medical History:  She,  has a past medical history of Allergy, Bronchitis, chronic ([place]), Chronic anxiety, Complication of anesthesia, COPD (chronic obstructive pulmonary disease) ([place]), Depression, Esophageal reflux, Headache(784.0), Hyperlipidemia, Hypertension, Hypothyroidism, Obesity, Osteoporosis, Overactive bladder, PVC's (premature ventricular contractions), Thyroid disease, and Vitamin D deficiency disease.   Surgical History:   Past Surgical History:  Procedure Laterality Date   ABDOMINAL HYSTERECTOMY     CHOLECYSTECTOMY N/A 11/04/2013   Procedure: LAPAROSCOPIC CHOLECYSTECTOMY WITH INTRAOPERATIVE CHOLANGIOGRAM;  Surgeon: Imogene Burn. Georgette Dover, MD;  Location: [facility];  Service: General;  Laterality: N/A;   COLONOSCOPY     FRACTURE SURGERY     pins removed from Hip surgery   HIP FRACTURE SURGERY  1990    pins removed in [place] [facility]       Social History:   reports that she has never smoked. She has never used smokeless tobacco. She reports that she does not drink alcohol and does not use drugs.   Family History:   Her family history includes Arthritis in an other family member; Cancer in an other family member; Diabetes in an other family member; Heart disease in an other family member; OCD in an other family member.   Allergies Allergies  Allergen Reactions   Celebrex [Celecoxib] Swelling   Keflet [Cephalexin] Swelling   Penicillins Hives and Swelling    DID THE REACTION INVOLVE: Swelling of the face/tongue/throat, SOB, or low BP? Yes-swelling-hives Sudden or severe rash/hives, skin peeling, or the inside of the mouth or nose? Unknown Did it require medical treatment? Unknown When did it last happen?    over 10 years   If all above answers are "NO", may proceed with cephalosporin use.    Sulfa Antibiotics Swelling   Kenalog [Triamcinolone Acetonide]     unknown   Latex    Lisinopril Other (See Comments)    unknown   Mobic [Meloxicam]     SWELLING     Home Medications  Prior to Admission medications   Medication Sig Start Date End Date Taking? Authorizing Provider  acetaminophen (TYLENOL) 650 MG CR tablet Take 650 mg by mouth every 8 (eight) hours as needed for pain.   Yes [provider]  ALPRAZolam (XANAX) 0.5 MG tablet Take 1 tablet (0.5 mg total) by mouth 2 (two) times daily as needed for anxiety (use SPARINGLY). 10/30/21  Yes Martin, Mary-Margaret, FNP  calcium carbonate (OS-CAL - DOSED IN MG OF ELEMENTAL CALCIUM) 1250 (500 Ca) MG tablet Take 1 tablet by mouth once a week.    Yes [provider]  Cholecalciferol (VITAMIN D3) 10 MCG (400 UNIT) CAPS Take 1 capsule by mouth daily.   Yes [provider]  escitalopram (LEXAPRO) 10 MG tablet Take 1 tablet (10 mg total) by mouth daily. 10/30/21  Yes Hassell Done, Mary-Margaret, FNP  furosemide (LASIX) 20 MG tablet Take 1 tablet (20 mg total) by mouth every Monday, Wednesday, and Friday. May take additional 20 mg for swelling 12/05/21 05/22/22 Yes Branch, Alphonse Guild, MD  levothyroxine (SYNTHROID) 50 MCG tablet TAKE ONE (1)  TABLET EACH DAY Patient taking differently: Take 50 mcg by mouth daily before breakfast. 10/30/21  Yes Hassell Done, Mary-Margaret, FNP  Multiple Vitamin (MULTIVITAMIN) tablet Take 1 tablet by mouth 2 (two) times a week.    Yes [provider]  omeprazole-sodium bicarbonate (ZEGERID) 40-1100 MG capsule Take 1 capsule by mouth daily before breakfast. 10/30/21  Yes Hassell Done, Mary-Margaret, FNP  ondansetron (ZOFRAN-ODT) 4 MG disintegrating tablet Take 1 tablet (4 mg total) by mouth every 8 (eight) hours as needed for nausea or vomiting. 01/04/22  Yes Fredia Sorrow, MD  telmisartan (MICARDIS) 80 MG tablet Take 1 tablet (80 mg total) by mouth daily. 09/28/21  Yes Hassell Done, Mary-Margaret, FNP      The patient is critically ill with multiple organ systems failure and requires high complexity decision making for assessment and support, frequent evaluation and titration of therapies, application of advanced monitoring technologies and extensive interpretation of multiple databases. Critical Care Time devoted to patient care services described in this note is 45 minutes.   Christinia Gully, MD Pulmonary and [place] [phone]   After 7:00 pm call Elink  [phone]

## 2022-01-10 NOTE — Progress Notes (Signed)
ETT retracted from 21 to 18 cm at lip per chest xray. Follow up xray to be ordered by RN. Tube holder placed and tube secured.

## 2022-01-10 NOTE — Transfer of Care (Signed)
Immediate Anesthesia Transfer of Care Note  Patient: Madi Bonfiglio  Procedure(s) Performed: EXPLORATORY LAPAROTOMY, (Abdomen) LYSIS OF ADHESION (Abdomen) CENTRAL LINE INSERTION (Right: Neck)  Patient Location: ICU  Anesthesia Type:General  Level of Consciousness: Patient remains intubated per anesthesia plan  Airway & Oxygen Therapy: Patient remains intubated per anesthesia plan and Patient placed on Ventilator (see vital sign flow sheet for setting)  Post-op Assessment: Report given to RN and Post -op Vital signs reviewed and stable  Post vital signs: Reviewed and stable  Last Vitals:  Vitals Value Taken Time  BP 118/55 01/10/22 1135  Temp 36.4 C 01/10/22 1131  Pulse 73 01/10/22 1139  Resp 17 01/10/22 1141  SpO2 96 % 01/10/22 1139  Vitals shown include unvalidated device data.  Last Pain:  Vitals:   01/10/22 1131  TempSrc: Axillary  PainSc:          Complications: No notable events documented.

## 2022-01-10 NOTE — Anesthesia Preprocedure Evaluation (Addendum)
Anesthesia Evaluation  Patient identified by MRN, date of birth, ID band Patient awake and Patient confused    Reviewed: Allergy & Precautions, NPO status , Patient's Chart, lab work & pertinent test results  History of Anesthesia Complications (+) history of anesthetic complications  Airway Mallampati: II  TM Distance: >3 FB Neck ROM: Full    Dental  (+) Dental Advisory Given, Missing   Pulmonary COPD,  COPD inhaler,    Pulmonary exam normal breath sounds clear to auscultation       Cardiovascular Exercise Tolerance: Poor hypertension, Pt. on medications  Rhythm:Regular Rate:Normal + Systolic murmurs    Neuro/Psych  Headaches, PSYCHIATRIC DISORDERS Anxiety Depression  Neuromuscular disease    GI/Hepatic Neg liver ROS, GERD  Medicated and Poorly Controlled,Small bowel obstruction   Endo/Other  Hypothyroidism   Renal/GU Renal Insufficiency and ARFRenal disease  negative genitourinary   Musculoskeletal  (+) Arthritis , Osteoarthritis,    Abdominal   Peds negative pediatric ROS (+)  Hematology  (+) Blood dyscrasia, anemia ,   Anesthesia Other Findings   Reproductive/Obstetrics negative OB ROS                          Anesthesia Physical Anesthesia Plan  ASA: 4 and emergent  Anesthesia Plan: General   Post-op Pain Management: Dilaudid IV   Induction: Intravenous  PONV Risk Score and Plan: 4 or greater and Ondansetron and Dexamethasone  Airway Management Planned: Oral ETT  Additional Equipment: Arterial line  Intra-op Plan:   Post-operative Plan: Post-operative intubation/ventilation  Informed Consent: I have reviewed the patients History and Physical, chart, labs and discussed the procedure including the risks, benefits and alternatives for the proposed anesthesia with the patient or authorized representative who has indicated his/her understanding and acceptance.     Dental  advisory given and Consent reviewed with POA  Plan Discussed with: CRNA and Surgeon  Anesthesia Plan Comments: (Risks of possible prolonged ventilation, permanent renal failure, cardiac arrest and stroke was explained to the family.)      Anesthesia Quick Evaluation

## 2022-01-10 NOTE — Progress Notes (Signed)
Hypoglycemic Event  CBG: 58  Treatment: D50 25 mL (12.5 gm)  Symptoms:  patient just came from OR intubated  Follow-up CBG: Time:1222pm CBG Result: 95  Possible Reasons for Event: Unknown  Comments/MD notified: Dr Dot Lanes

## 2022-01-10 NOTE — Progress Notes (Signed)
Pt transported to surgery via bed by staff members.

## 2022-01-10 NOTE — Progress Notes (Signed)
BP consistently running low. NT tube to Moderate Intermittent Suction with brown drainage. Patient slept throughout the night with no complaints.

## 2022-01-10 NOTE — Progress Notes (Addendum)
Dr Roger Shelter and Dr Melvyn Novas made aware of CBG checks, Urine output and updated on patient changing conditions since arriving to the unit/room from the OR this morning. Patient appears to be comfortable with no signs or symptoms of distress, pain or discomfort. Sedation adjusted and titrated down and patient does open eyes when spoken to and squeezed writers hands with good grip.   Update at 1815: Dr Moshe Cipro called and made aware of renal panel results (lab) and 32m urine output so far since IV Lasix given. Dr SRoger Sheltermade aware also.  Per verbal from Dr GMoshe Cipro she stated she was ok with CVP reading between 12-15 and  Per verbal from Dr WMelvyn Novas he stated he was ok with CVP readings between 10-12.   Writer just called via camera on patient room wall and updated Elink to assist with any needs that may arise over night shift. Writer will also update night shift RN.  Map noting to be around 55-58, notified Dr SRoger Shelter NS 500cc bolus ordered and now being given.

## 2022-01-10 NOTE — Op Note (Signed)
[facility] Operative Note  01/10/22  Preoperative Diagnosis: Small bowel obstruction   Postoperative Diagnosis: Same   Procedure(s) Performed: Exploratory laparotomy lysis of adhesion    Surgeon: Lanell Matar. Constance Haw, MD   Assistants: No qualified resident was available    Anesthesia: General endotracheal   Anesthesiologist: Dr. Charna Elizabeth    Specimens: None    Estimated Blood Loss: Minimal   Blood Replacement: None    Complications: None   Wound Class: Clean    Operative Indications: [name] is a 76 yo with a SBO that was noted on 01/07/22 and repeat imaging demonstrated continued high grade bowel obstruction and concern for impending ischemia. She had worsening leukocytosis and acute kidney injury, and I discussed exploration with her and her son. We discussed risk of bleeding, infection, need for possible resection, staying intubated, need for rehab, we discussed possible need for transfer if she continued to have other issues with her respiratory or renal systems.   Findings: Dilated small bowel, adhesion in the right lower quadrant causing bowel obstruction, lysed    Procedure: The patient was taken to the operating room and placed supine. General endotracheal anesthesia was induced. Intravenous antibiotics were administered per protocol.  A nasogastric tube was already in place to decompress the stomach. A foley catheter was placed. The abdomen was prepared and draped in the usual sterile fashion. Anesthesia had placed an arterial line and the patient had received boluses before surgery.  A midline incision was done and carried down to the fascia. The fascia was entered with care with scissors. The bowel was eviscerated and noted to be dilated but viable. A single adhesion was noted in the right lower quadrant between the omentum and epiploic fat. The adhesion was lysed with cautery. The bowel was ran from the terminal ileum to the ligament of treitz. A  serosal tear was noted after running the bowel and was oversewn with 3-0 silk lembert suture.   The NG was verified in the stomach. The abdomen was irrigated. The bowel was ran again and placed back into the abdomen in anatomic position.   The midline was closed with 0 PDS suture. The skin was closed with staples. A honeycomb was placed on the midline.   During the procedure the patient had some lower Bps and had to be placed on levophed. At the completion, a central line was placed to give access. The right chest and neck was prepped and draped in the usual sterile fashion.  Wearing full gown and gloves, I performed the procedure.  An ultrasound was utilized to assess the jugular vein.  The needle with syringe was advanced into the subclavian vein with dark venous return, and a wire was placed using the Seldinger technique without difficulty.  Ectopia was not noted.  The skin was knicked and a dilator was placed, and the three lumen catheter was placed over the wire with continued control of the wire.  There was good draw back of blood from all three lumens and each flushed easily with saline.  The catheter was secured in 4 points with 2-0 silk and a biopatch and dressing was placed.     Final inspection revealed acceptable hemostasis. All counts were correct at the end of the case. The patient was taken to the ICU intubated and on pressors.    Cindy Labrum, MD [facility] [address] [place], [place] [address] [phone] (office)

## 2022-01-10 NOTE — Anesthesia Postprocedure Evaluation (Addendum)
Anesthesia Post Note  Patient: [name]  Procedure(s) Performed: EXPLORATORY LAPAROTOMY, (Abdomen) LYSIS OF ADHESION (Abdomen) CENTRAL LINE INSERTION (Right: Neck)  Patient location during evaluation: ICU Anesthesia Type: General Level of consciousness: patient remains intubated per anesthesia plan Pain management: pain level controlled Vital Signs Assessment: vitals unstable Respiratory status: patient remains intubated per anesthesia plan, patient on ventilator - see flowsheet for VS and respiratory function unstable Cardiovascular status: unstable Postop Assessment: no apparent nausea or vomiting Anesthetic complications: no Comments: Lactated ringers 2 liters, albumin 5% 500 ml preoperatively and Normal saline 1400 ml and albumin 5% 500 ml intraoperatively were given by anesthesia.   No notable events documented.   Last Vitals:  Vitals:   01/10/22 1300 01/10/22 1400  BP: (!) 116/54 (!) 85/44  Pulse:  71  Resp: 19 18  Temp:    SpO2:  100%    Last Pain:  Vitals:   01/10/22 1131  TempSrc: Axillary  PainSc:                  Brenisha Tsui C Mahasin Riviere

## 2022-01-10 NOTE — Progress Notes (Signed)
[facility]  CT scan with read back at 341. Saw official read at 6AM.  RN reports patient slept through night and no major complaints. She is a bit confused at times, and BP running 90s consistently.   CT Concern for SBO high grade and some developing ischemia given the mesenteric vasculature. Discussed with son and tried to talk to patient about surgery. Discussed risk of bleeding, infection, bowel resection and finding something unexpected like cancer. Discussed rehab and potential of staying intubated after surgery due to the COPD and PNA.  Plan for OR emergently. Will give some fluids and get resuscitated more as her AKI is worsening and her WBC is worsening. Has been on MIVF at 125 per documentation.     Plan for Ex lap, possible lysis of adhesions, possible SBO.   Curlene Labrum, MD [facility] [address] [place], [place] [address] [phone] (office)

## 2022-01-10 NOTE — Progress Notes (Addendum)
Air was heard coming from back of patient's throat. RT orally suctioned pt, laid head flat and look in patients mouth with laryngoscope to check tube placement. Cuff balloon was not seen. According to chest XRay done at 1300 today tube had been retracted too far, then not corrected. This RT along with RN advanced tube to 20cm at the lip then a stat xray was done. After first picture tube was still high so we advanced to 22cm at the lip. Chest xray confirmed better placement, ETCO2 showed proper color change, bilateral breath sounds present and better and patient is getting better volumes on ventilator. Patient tolerated procedure well with no adverse events noted.   RN making MD aware.

## 2022-01-10 NOTE — Anesthesia Procedure Notes (Signed)
Arterial Line Insertion Start/End8/17/2023 7:20 AM, 01/10/2022 7:28 AM Performed by: Denese Killings, MD, Gwyndolyn Saxon, CRNA, CRNA  Patient location: Pre-op. Preanesthetic checklist: patient identified, IV checked, site marked, risks and benefits discussed, surgical consent, monitors and equipment checked, pre-op evaluation, timeout performed and anesthesia consent Lidocaine 1% used for infiltration Left, radial was placed Catheter size: 20 G Hand hygiene performed , maximum sterile barriers used  and Seldinger technique used Allen's test indicative of satisfactory collateral circulation Attempts: 1 Procedure performed without using ultrasound guided technique. Following insertion, Biopatch and dressing applied. Post procedure assessment: normal  Patient tolerated the procedure well with no immediate complications.

## 2022-01-10 NOTE — Progress Notes (Signed)
Patient's jewelry given to Estill Bamberg, patient daughter-in-law who states she will be taking them  home with her.

## 2022-01-10 NOTE — Anesthesia Procedure Notes (Signed)
Procedure Name: Intubation Date/Time: 01/10/2022 9:45 AM  Performed by: Gwyndolyn Saxon, CRNAPre-anesthesia Checklist: Patient identified, Emergency Drugs available, Suction available and Patient being monitored Patient Re-evaluated:Patient Re-evaluated prior to induction Oxygen Delivery Method: Circle system utilized Preoxygenation: Pre-oxygenation with 100% oxygen Induction Type: IV induction, Rapid sequence and Cricoid Pressure applied Laryngoscope Size: Miller and 2 Grade View: Grade I Tube type: Oral Tube size: 7.0 mm Number of attempts: 1 Airway Equipment and Method: Patient positioned with wedge pillow and Stylet Placement Confirmation: ETT inserted through vocal cords under direct vision, positive ETCO2 and breath sounds checked- equal and bilateral Secured at: 21 cm Tube secured with: Tape Dental Injury: Teeth and Oropharynx as per pre-operative assessment

## 2022-01-11 DIAGNOSIS — R6521 Severe sepsis with septic shock: Secondary | ICD-10-CM | POA: Diagnosis not present

## 2022-01-11 DIAGNOSIS — J9602 Acute respiratory failure with hypercapnia: Secondary | ICD-10-CM | POA: Diagnosis not present

## 2022-01-11 DIAGNOSIS — R579 Shock, unspecified: Secondary | ICD-10-CM

## 2022-01-11 DIAGNOSIS — J9601 Acute respiratory failure with hypoxia: Secondary | ICD-10-CM | POA: Diagnosis not present

## 2022-01-11 DIAGNOSIS — A419 Sepsis, unspecified organism: Secondary | ICD-10-CM | POA: Diagnosis not present

## 2022-01-11 DIAGNOSIS — J9690 Respiratory failure, unspecified, unspecified whether with hypoxia or hypercapnia: Secondary | ICD-10-CM | POA: Diagnosis not present

## 2022-01-11 DIAGNOSIS — N179 Acute kidney failure, unspecified: Secondary | ICD-10-CM | POA: Diagnosis not present

## 2022-01-11 LAB — COMPREHENSIVE METABOLIC PANEL
ALT: 15 U/L (ref 0–44)
ALT: 13 U/L (ref 0–44)
AST: 30 U/L (ref 15–41)
AST: 29 U/L (ref 15–41)
Albumin: 2.3 g/dL — ABNORMAL LOW (ref 3.5–5.0)
Albumin: 2.3 g/dL — ABNORMAL LOW (ref 3.5–5.0)
Alkaline Phosphatase: 63 U/L (ref 38–126)
Alkaline Phosphatase: 58 U/L (ref 38–126)
Anion gap: 9 (ref 5–15)
Anion gap: 8 (ref 5–15)
BUN: 88 mg/dL — ABNORMAL HIGH (ref 8–23)
BUN: 91 mg/dL — ABNORMAL HIGH (ref 8–23)
CO2: 21 mmol/L — ABNORMAL LOW (ref 22–32)
CO2: 18 mmol/L — ABNORMAL LOW (ref 22–32)
Calcium: 7.4 mg/dL — ABNORMAL LOW (ref 8.9–10.3)
Calcium: 7 mg/dL — ABNORMAL LOW (ref 8.9–10.3)
Chloride: 106 mmol/L (ref 98–111)
Chloride: 108 mmol/L (ref 98–111)
Creatinine, Ser: 4.79 mg/dL — ABNORMAL HIGH (ref 0.44–1.00)
Creatinine, Ser: 5.14 mg/dL — ABNORMAL HIGH (ref 0.44–1.00)
GFR, Estimated: 9 mL/min — ABNORMAL LOW (ref >60)
GFR, Estimated: 8 mL/min — ABNORMAL LOW (ref >60)
Glucose, Bld: 119 mg/dL — ABNORMAL HIGH (ref 70–99)
Glucose, Bld: 97 mg/dL (ref 70–99)
Potassium: 4 mmol/L (ref 3.5–5.1)
Potassium: 3.6 mmol/L (ref 3.5–5.1)
Sodium: 136 mmol/L (ref 135–145)
Sodium: 134 mmol/L — ABNORMAL LOW (ref 135–145)
Total Bilirubin: 2.2 mg/dL — ABNORMAL HIGH (ref 0.3–1.2)
Total Bilirubin: 1.4 mg/dL — ABNORMAL HIGH (ref 0.3–1.2)
Total Protein: 4.8 g/dL — ABNORMAL LOW (ref 6.5–8.1)
Total Protein: 4.6 g/dL — ABNORMAL LOW (ref 6.5–8.1)

## 2022-01-11 LAB — URINALYSIS, ROUTINE W REFLEX MICROSCOPIC
Bilirubin Urine: NEGATIVE
Glucose, UA: NEGATIVE mg/dL
Ketones, ur: NEGATIVE mg/dL
Nitrite: NEGATIVE
Protein, ur: NEGATIVE mg/dL
Specific Gravity, Urine: 1.01 (ref 1.005–1.030)
WBC, UA: >50 WBC/hpf — ABNORMAL HIGH (ref 0–5)
pH: 5 (ref 5.0–8.0)

## 2022-01-11 LAB — BASIC METABOLIC PANEL
Anion gap: 12 (ref 5–15)
BUN: 88 mg/dL — ABNORMAL HIGH (ref 8–23)
CO2: 20 mmol/L — ABNORMAL LOW (ref 22–32)
Calcium: 7.4 mg/dL — ABNORMAL LOW (ref 8.9–10.3)
Chloride: 104 mmol/L (ref 98–111)
Creatinine, Ser: 4.79 mg/dL — ABNORMAL HIGH (ref 0.44–1.00)
GFR, Estimated: 9 mL/min — ABNORMAL LOW (ref >60)
Glucose, Bld: 122 mg/dL — ABNORMAL HIGH (ref 70–99)
Potassium: 4.1 mmol/L (ref 3.5–5.1)
Sodium: 136 mmol/L (ref 135–145)

## 2022-01-11 LAB — PHOSPHORUS: Phosphorus: 3.1 mg/dL (ref 2.5–4.6)

## 2022-01-11 LAB — CBC WITH DIFFERENTIAL/PLATELET
Abs Immature Granulocytes: 0.83 10*3/uL — ABNORMAL HIGH (ref 0.00–0.07)
Basophils Absolute: 0.1 10*3/uL (ref 0.0–0.1)
Basophils Relative: 1 %
Eosinophils Absolute: 0.4 10*3/uL (ref 0.0–0.5)
Eosinophils Relative: 2 %
HCT: 31.8 % — ABNORMAL LOW (ref 36.0–46.0)
Hemoglobin: 10.2 g/dL — ABNORMAL LOW (ref 12.0–15.0)
Immature Granulocytes: 4 %
Lymphocytes Relative: 8 %
Lymphs Abs: 1.9 10*3/uL (ref 0.7–4.0)
MCH: 28.7 pg (ref 26.0–34.0)
MCHC: 32.1 g/dL (ref 30.0–36.0)
MCV: 89.3 fL (ref 80.0–100.0)
Monocytes Absolute: 1.7 10*3/uL — ABNORMAL HIGH (ref 0.1–1.0)
Monocytes Relative: 7 %
Neutro Abs: 18.1 10*3/uL — ABNORMAL HIGH (ref 1.7–7.7)
Neutrophils Relative %: 78 %
Platelets: 164 10*3/uL (ref 150–400)
RBC: 3.56 MIL/uL — ABNORMAL LOW (ref 3.87–5.11)
RDW: 17.3 % — ABNORMAL HIGH (ref 11.5–15.5)
WBC: 23 10*3/uL — ABNORMAL HIGH (ref 4.0–10.5)
nRBC: 0.5 % — ABNORMAL HIGH (ref 0.0–0.2)

## 2022-01-11 LAB — LACTIC ACID, PLASMA
Lactic Acid, Venous: 1.2 mmol/L (ref 0.5–1.9)
Lactic Acid, Venous: 1.1 mmol/L (ref 0.5–1.9)

## 2022-01-11 LAB — GLUCOSE, CAPILLARY
Glucose-Capillary: 94 mg/dL (ref 70–99)
Glucose-Capillary: 103 mg/dL — ABNORMAL HIGH (ref 70–99)
Glucose-Capillary: 116 mg/dL — ABNORMAL HIGH (ref 70–99)
Glucose-Capillary: 115 mg/dL — ABNORMAL HIGH (ref 70–99)
Glucose-Capillary: 108 mg/dL — ABNORMAL HIGH (ref 70–99)
Glucose-Capillary: 102 mg/dL — ABNORMAL HIGH (ref 70–99)

## 2022-01-11 LAB — BLOOD GAS, ARTERIAL
Acid-base deficit: 4.2 mmol/L — ABNORMAL HIGH (ref 0.0–2.0)
Bicarbonate: 21 mmol/L (ref 20.0–28.0)
Drawn by: 38235
FIO2: 40 %
O2 Saturation: 96.6 %
Patient temperature: 37
pCO2 arterial: 38 mmHg (ref 32–48)
pH, Arterial: 7.35 (ref 7.35–7.45)
pO2, Arterial: 77 mmHg — ABNORMAL LOW (ref 83–108)

## 2022-01-11 LAB — TRIGLYCERIDES: Triglycerides: 82 mg/dL (ref <150)

## 2022-01-11 LAB — MAGNESIUM
Magnesium: 2.2 mg/dL (ref 1.7–2.4)
Magnesium: 2 mg/dL (ref 1.7–2.4)

## 2022-01-11 MED ORDER — METOPROLOL TARTRATE 5 MG/5ML IV SOLN
5.0000 mg | Freq: Once | INTRAVENOUS | Status: AC
  Administered 2022-01-11: 5 mg via INTRAVENOUS
  Filled 2022-01-11: qty 5

## 2022-01-11 MED ORDER — SODIUM CHLORIDE 0.9 % IV SOLN
1.0000 g | Freq: Three times a day (TID) | INTRAVENOUS | Status: DC
  Administered 2022-01-11 – 2022-01-12 (×4): 1 g via INTRAVENOUS
  Filled 2022-01-11 (×7): qty 5

## 2022-01-11 MED ORDER — AZTREONAM 2 G IJ SOLR
2.0000 g | Freq: Once | INTRAMUSCULAR | Status: AC
  Administered 2022-01-11: 2 g via INTRAVENOUS
  Filled 2022-01-11: qty 10

## 2022-01-11 MED ORDER — METRONIDAZOLE 500 MG/100ML IV SOLN
500.0000 mg | Freq: Two times a day (BID) | INTRAVENOUS | Status: DC
  Administered 2022-01-11 – 2022-01-15 (×9): 500 mg via INTRAVENOUS
  Filled 2022-01-11 (×9): qty 100

## 2022-01-11 MED ORDER — ALBUMIN HUMAN 25 % IV SOLN
12.5000 g | INTRAVENOUS | Status: AC
  Administered 2022-01-11 (×2): 12.5 g via INTRAVENOUS
  Filled 2022-01-11 (×2): qty 50

## 2022-01-11 MED ORDER — SODIUM CHLORIDE 0.9 % IV SOLN: 2.0000 g | Freq: Once | INTRAVENOUS | Status: DC

## 2022-01-11 NOTE — Progress Notes (Signed)
Elink following sepsis bundle. °

## 2022-01-11 NOTE — Progress Notes (Signed)
Pharmacy Antibiotic Note  [name] is a 76 y.o. female admitted on 01/07/2022 with  unknown source .  Pharmacy has been consulted for aztreonam dosing. Patient with small bowel obstruction, with marked leukocytosis, AKI. Concern for worsening ischemic bowel and taken to OR 8/17 for lysis of adhesions.   Plan: Aztreonam 2gm IV x 1 then 1gm IV q8h Also, MD ordered metronidazole '500mg'$  IV q12h F/U cxs and clinical progress Monitor V/S, labs  Height: '5\' 4"'$  (162.6 cm) Weight: 87.2 kg (192 lb 3.9 oz) IBW/kg (Calculated) : 54.7  Temp (24hrs), Avg:98.2 F (36.8 C), Min:97.5 F (36.4 C), Max:99.2 F (37.3 C)  Recent Labs  Lab 01/04/22 2026 01/04/22 2220 01/07/22 1227 01/07/22 2055 01/08/22 0433 01/08/22 1243 01/09/22 0415 01/10/22 0412 01/10/22 1255 01/10/22 1535 01/10/22 1657 01/11/22 0356  WBC  --   --  9.8 13.9* 13.1*  --   --  21.3*  --   --   --  23.0*  CREATININE  --   --  1.84* 1.82* 2.17*   < > 3.72* 4.82* 4.66*  --  4.55* 4.79*  LATICACIDVEN 1.2 1.1  --   --   --   --   --   --  2.0* 1.0  --   --    < > = values in this interval not displayed.    Estimated Creatinine Clearance: 10.8 mL/min (A) (by C-G formula based on SCr of 4.79 mg/dL (H)).    Allergies  Allergen Reactions   Celebrex [Celecoxib] Swelling   Keflet [Cephalexin] Swelling   Penicillins Hives and Swelling    DID THE REACTION INVOLVE: Swelling of the face/tongue/throat, SOB, or low BP? Yes-swelling-hives Sudden or severe rash/hives, skin peeling, or the inside of the mouth or nose? Unknown Did it require medical treatment? Unknown When did it last happen?    over 10 years   If all above answers are "NO", may proceed with cephalosporin use.    Sulfa Antibiotics Swelling   Kenalog [Triamcinolone Acetonide]     unknown   Latex    Lisinopril Other (See Comments)    unknown   Mobic [Meloxicam]     SWELLING    Antimicrobials this admission: Aztreonam 8/18>> Flagyl 8/18>> Levaquin 8/15 >>  8/18  Microbiology results: 8/17 BCx: pending 8/14 Ucx: multiple species 8/17 MRSA PCR: negative  Thank you for allowing pharmacy to be a part of this patient's care.  Isac Sarna, BS Pharm D, BCPS Clinical Pharmacist 01/11/2022 8:19 AM

## 2022-01-11 NOTE — Progress Notes (Signed)
Initial Nutrition Assessment  DOCUMENTATION CODES:      INTERVENTION:  If patient able to wean once transferred. RD will continue to follow diet advancement and or make additional recommendations.   NUTRITION DIAGNOSIS:   Inadequate oral intake related to acute illness as evidenced by NPO status.   GOAL:  Provide needs based on ASPEN/SCCM guidelines   MONITOR:  Vent status, I & O's, Weight trends, Skin (nutriton support measures)  REASON FOR ASSESSMENT:   Ventilator    ASSESSMENT: Patient is a 76 yo female with septic shock, high-grade small bowel obstruction. PMH: HTN, Vitamin D deficiency, thyroid disease. 8/11 vomiting and diarrhea.  8/17- status post exploratory laparotomy with lysis of adhesions. Acute kidney injury. Per Nephrology-pt anuric and is scheduled for transfer to [facility] for CRRT. 8/17-Right IJ.  Patient is currently intubated on ventilator support and pressor. Discussed with nursing. Patient is doing well and expecting for possible extubation once at [facility]. Patient has her eyes open. Need assessed based on current vent settings and weight of 78.7 kg. RD's at [facility] notified of transfer. No family bedside.   NGT- 275 ml OP (low intermittent suction)  MV: 8.9 L/min Temp (24hrs), Avg:98.6 F (37 C), Min:97.7 F (36.5 C), Max:99.2 F (37.3 C)  Propofol: 9.36 ml/hr. (247 kcal - q 24hrs)  Levophed @ 37.5 ml/hr  IVF-D5/NS@ 125 ml/hr.   Intake/Output Summary (Last 24 hours) at 01/11/2022 1256 Last data filed at 01/11/2022 0951 Gross per 24 hour  Intake 3316.55 ml  Output 355 ml  Net 2961.55 ml   Since admission- +1.58 liters. Patient weight in July 78.7 kg- currently 87.2 kg.      Latest Ref Rng & Units 01/11/2022    8:06 AM 01/11/2022    3:56 AM 01/10/2022    4:57 PM  BMP  Glucose 70 - 99 mg/dL 119  122  98   BUN 8 - 23 mg/dL 88  88  85   Creatinine 0.44 - 1.00 mg/dL 4.79  4.79  4.55   Sodium 135 - 145 mmol/L 136  136  136   Potassium 3.5 - 5.1 mmol/L 4.0   4.1  4.3   Chloride 98 - 111 mmol/L 106  104  103   CO2 22 - 32 mmol/L '21  20  23   '$ Calcium 8.9 - 10.3 mg/dL 7.4  7.4  7.3      NUTRITION - FOCUSED PHYSICAL EXAM: Limited NFPE - nursing bedside working with patient during RD visit. Edematous upper and lower extremities. Receiving albumin per nursing.    Diet Order:   Diet Order             Diet NPO time specified  Diet effective now                   EDUCATION NEEDS:  Not appropriate for education at this time  Skin:  Skin Assessment: Skin Integrity Issues: Skin Integrity Issues:: Stage II Stage II: coccyx  Last BM:  8/15  Height:   Ht Readings from Last 1 Encounters:  01/11/22 '5\' 4"'$  (1.626 m)    Weight:   Wt Readings from Last 1 Encounters:  01/11/22 87.2 kg    Ideal Body Weight:   55 kg  BMI:  Body mass index is 33 kg/m.  Estimated Nutritional Needs:   Kcal:  1467  Protein:  110-121 gr  Fluid:  per MD goal   Colman Cater MS,RD,CSG,LDN Contact: Shea Evans
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
## 2022-01-11 NOTE — Progress Notes (Addendum)
PROGRESS NOTE    Patient: [name]                            PCP: Chevis Pretty, FNP                    DOB: 1945-12-17            DOA: 01/07/2022 RXV:[id number]             DOS: 01/11/2022, 10:45 AM   LOS: 4 days   Date of Service: The patient was seen and examined on 01/11/2022  Subjective:   The patient was seen and examined, comfortable on the vent, still on pressors Making minimum urine Daughter present at bedside--plan of care and discharge discussed in detail  Patient will be transferred to [facility] ICU   Brief Narrative:   76 yowf never smoker with "copd"(not supported by pfts)   hyperlipidemia, essential hypertension, hypothyroidism, pulmonary embolism in the past, osteoarthritis, and depression who presents from home with vomiting and diarrhea since 8/11 when  presented with  acute onset of abdominal pain CT scan was obtained and was unremarkable she received some IV fluids and antiemetics and was discharged home.  Her V and D persisted and when  previous treatment did not alleviate her symptoms   she presented to [facility] with    CT scan was obtained and found to show a partial small bowel obstruction and significant changes compared to the CT scan she had on 8/11      ED Course:  Patient was given an oral dose of Zofran and some IV fluid drip.  She continued to have persistent nausea in the emergency department and vomited at least 200 mL  witnessed  She was given additional Zofran at that point in her IV.  Also had 1 episode of diarrhea while here in the emergency department which is described as small vol. Labs were consistent with acute kidney injury and hypokalemia CT scan showeda partial small bowel obstruction.      CT scan was obtained and found to show a partial small bowel obstruction and significant changes compared to the CT scan she had on Friday.   To OR 68/17 Exploratory laparotomy lysis of adhesion and remained on vent/ on pressors post op and PCCM service  consulted      Assessment/Plan Principal Problem:   Partial small bowel obstruction (HCC) Active Problems:   COPD (chronic obstructive pulmonary disease) (HCC)   Hyperlipidemia   Hypothyroidism (acquired)   Essential hypertension   History of pulmonary embolus (PE)   Septic shock -Likely due to ischemic bowel,..  Night prior to surgery on 01/10/2022 patient became hypotensive with marked leukocytosis and acute renal failure -Exacerbated by ischemic small bowel obstruction -Status post full code laparotomy lysis of adhesion on 01/10/2022 -Status post aggressive IV fluid resuscitation with LR -Still meeting sepsis, septic shock criteria due to hypotension, marked leukocytosis, acute renal failure, acute respiratory failure -Focus sepsis protocol was initiated -Continue Levophed, continue patient intubated and on the vent -Critical care team following  -Initial antibiotics Levaquin has been broadened based on sepsis protocol -Remained hypotensive on vasopressors at 10 mcg of Levophed, on the vent for acute respiratory failure postop -Repeat ABG chest x-ray per PCCM  Acute respiratory failure -Postop 01/10/2022 s/p exploratory laparotomy lysis of adhesion Remain intubated... On the vent ABG/vent management per PCCM Dr. Melvyn Novas following closely -Comfortable on the vent, no issues overnight  Patient will be transferred to [facility] ICU  PCCM and nephrology following closely possible need for CRRT   Small bowel obstruction: Postop day #1 01/10/2022 status post urgent exploratory laparotomy by Dr. Constance Haw Lysis of adhesions, relieving small bowel obstruction  -Subsequent postop patient met sepsis, septic shock criteria due to hypotension, acute respiratory failure, acute renal failure    Acute renal insufficiency versus ATN -Likely exacerbated due to dehydration, SBO, hypertension (? CKD -creatinine 1.09 on 01/04/2022) -Progressive renal failure in the setting high-grade small bowel  obstruction, possible ischemic bowel... No signs of renal artery obstructions UA possible UTI Lab Results  Component Value Date   CREATININE 4.79 (H) 01/11/2022   CREATININE 4.79 (H) 01/11/2022   CREATININE 4.55 (H) 01/10/2022    BUN 53, 62, 75>>> 94 -Monitoring, avoiding nephrotoxins --Consult nephrology: Appreciate input and close follow-up If needing RRT patient will be transferred to [facility]  COPD:  -On the vent -As needed DuoNeb bronchodilators   Hyperlipidemia: Patient manages her hyperlipidemia with diet control.   Hypothyroidism: Continue home dose Synthroid   Hypotensive -On pressor, Levophed at 10 mcg/h  Essential hypertension:  -Hypotensive, holding home BP meds -  as needed hydralazine.  Remote history of pulmonary embolism: Noted.  Not currently anticoagulated as this was in the past.     OR  8/17  Exploratory laparotomy lysis of adhesion  ET   8/17    >>> R I J   8/17 >>>     Skin Assessment: I have examined the patient's skin and I agree with the wound assessment as performed by wound care team As outlined belowe: Pressure Injury 01/10/22 Coccyx Medial;Mid Stage 2 -  Partial thickness loss of dermis presenting as a shallow open injury with a red, pink wound bed without slough. pink (Active)  01/10/22 1200  Location: Coccyx  Location Orientation: Medial;Mid  Staging: Stage 2 -  Partial thickness loss of dermis presenting as a shallow open injury with a red, pink wound bed without slough.  Wound Description (Comments): pink  Present on Admission:   Dressing Type Foam - Lift dressing to assess site every shift 01/10/22 1200     ---------------------------------------------------------------------------------------------------------------------------------------------------- Cultures; Blood Cultures x 2 >> NGT Urine Culture  >>> NGT    -----------------------------------------------------------------------------------------------------------------------------------------  DVT prophylaxis:  heparin injection 5,000 Units Start: 01/07/22 2200   Code Status:   Code Status: Full Code  Family Communication: Discussed with daughter at bedside The above findings and plan of care has been discussed with patient (and family)  in detail,  they expressed understanding and agreement of above. -Advance care planning has been discussed.   Admission status:   Status is: Inpatient Remains inpatient appropriate because: Needing ICU admission, intubated Vent management, pressors for BP management, IV fluids, IV antibiotics, consultant input regarding acute renal failure, acute respiratory failure     Procedures:   No admission procedures for hospital encounter.   Antimicrobials:  Anti-infectives (From admission, onward)    Start     Dose/Rate Route Frequency Ordered Stop   01/11/22 1400  aztreonam (AZACTAM) 1 g in sodium chloride 0.9 % 100 mL IVPB       See Hyperspace for full Linked Orders Report.   1 g 200 mL/hr over 30 Minutes Intravenous Every 8 hours 01/11/22 0815     01/11/22 1000  levofloxacin (LEVAQUIN) IVPB 500 mg  Status:  Discontinued        500 mg 100 mL/hr over 60 Minutes Intravenous Every 48 hours 01/10/22 1432  01/11/22 0744   01/11/22 0915  aztreonam (AZACTAM) 2 g in sodium chloride 0.9 % 100 mL IVPB       See Hyperspace for full Linked Orders Report.   2 g 200 mL/hr over 30 Minutes Intravenous  Once 01/11/22 0815 01/11/22 1044   01/11/22 0845  aztreonam (AZACTAM) 2 g in sodium chloride 0.9 % 100 mL IVPB  Status:  Discontinued        2 g 200 mL/hr over 30 Minutes Intravenous  Once 01/11/22 0747 01/11/22 0815   01/11/22 0845  metroNIDAZOLE (FLAGYL) IVPB 500 mg        500 mg 100 mL/hr over 60 Minutes Intravenous Every 12 hours 01/11/22 0747 01/18/22 0844   01/10/22 1900  levofloxacin (LEVAQUIN) IVPB 500  mg  Status:  Discontinued        500 mg 100 mL/hr over 60 Minutes Intravenous Every 24 hours 01/10/22 1426 01/10/22 1432   01/10/22 0700  ciprofloxacin (CIPRO) IVPB 400 mg        400 mg 200 mL/hr over 60 Minutes Intravenous On call to O.R. 01/10/22 0612 01/10/22 1052   01/10/22 0700  metroNIDAZOLE (FLAGYL) IVPB 500 mg  Status:  Discontinued        500 mg 100 mL/hr over 60 Minutes Intravenous On call to O.R. 01/10/22 0612 01/10/22 1123   01/08/22 0830  Levofloxacin (LEVAQUIN) IVPB 250 mg  Status:  Discontinued        250 mg 50 mL/hr over 60 Minutes Intravenous Every 24 hours 01/08/22 0724 01/08/22 0727   01/08/22 0830  Levofloxacin (LEVAQUIN) IVPB 250 mg  Status:  Discontinued        250 mg 50 mL/hr over 60 Minutes Intravenous Every 48 hours 01/08/22 0727 01/08/22 0732   01/08/22 0830  Levofloxacin (LEVAQUIN) IVPB 250 mg  Status:  Discontinued        250 mg 50 mL/hr over 60 Minutes Intravenous Every 24 hours 01/08/22 0732 01/10/22 1426        Medication:   Chlorhexidine Gluconate Cloth  6 each Topical Daily   heparin  5,000 Units Subcutaneous Q8H   mouth rinse  15 mL Mouth Rinse Q2H   pantoprazole (PROTONIX) IV  40 mg Intravenous Daily   sodium chloride flush  3 mL Intravenous Q12H    sodium chloride, Place/Maintain arterial line **AND** sodium chloride, alum & mag hydroxide-simeth, LORazepam, morphine injection, morphine injection, [DISCONTINUED] ondansetron **OR** ondansetron (ZOFRAN) IV, mouth rinse, sodium chloride flush   Objective:   Vitals:   01/11/22 0900 01/11/22 0938 01/11/22 0940 01/11/22 1000  BP:    (!) 129/43  Pulse: 78   78  Resp: 19   20  Temp:      TempSrc:      SpO2: 99% 99% 98% 98%  Weight:      Height:  $Remove'5\' 4"'eZakLvM$  (1.626 m)      Intake/Output Summary (Last 24 hours) at 01/11/2022 1045 Last data filed at 01/11/2022 0951 Gross per 24 hour  Intake 4716.55 ml  Output 700 ml  Net 4016.55 ml   Filed Weights   01/10/22 0750 01/10/22 1131 01/11/22 0500   Weight: 78 kg 78.8 kg 87.2 kg     Examination:      Physical Exam:   General:  AAO x 3,  cooperative, no distress;   HEENT:  Normocephalic, PERRL, otherwise with in Normal limits   Neuro:  CNII-XII intact. , normal motor and sensation, reflexes intact   Lungs:   Clear to  auscultation BL, Respirations unlabored,  No wheezes / crackles  Cardio:    S1/S2, RRR, No murmure, No Rubs or Gallops   Abdomen:  Soft, non-tender, bowel sounds active all four quadrants, no guarding or peritoneal signs.  Muscular  skeletal:  Limited exam -global generalized weaknesses - in bed, able to move all 4 extremities,   2+ pulses,  symmetric, No pitting edema  Skin:  Dry, warm to touch, negative for any Rashes,  Wounds: Please see nursing documentation  Pressure Injury 01/10/22 Coccyx Medial;Mid Stage 2 -  Partial thickness loss of dermis presenting as a shallow open injury with a red, pink wound bed without slough. pink (Active)  01/10/22 1200  Location: Coccyx  Location Orientation: Medial;Mid  Staging: Stage 2 -  Partial thickness loss of dermis presenting as a shallow open injury with a red, pink wound bed without slough.  Wound Description (Comments): pink  Present on Admission:   Dressing Type Foam - Lift dressing to assess site every shift 01/10/22 1200          ------------------------------------------------------------------------------------------------------------------------------------------    LABs:     Latest Ref Rng & Units 01/11/2022    3:56 AM 01/10/2022    4:12 AM 01/08/2022    4:33 AM  CBC  WBC 4.0 - 10.5 K/uL 23.0  21.3  13.1   Hemoglobin 12.0 - 15.0 g/dL 10.2  11.1  11.7   Hematocrit 36.0 - 46.0 % 31.8  35.6  36.2   Platelets 150 - 400 K/uL 164  226  296       Latest Ref Rng & Units 01/11/2022    8:06 AM 01/11/2022    3:56 AM 01/10/2022    4:57 PM  CMP  Glucose 70 - 99 mg/dL 119  122  98   BUN 8 - 23 mg/dL 88  88  85   Creatinine 0.44 - 1.00 mg/dL 4.79  4.79   4.55   Sodium 135 - 145 mmol/L 136  136  136   Potassium 3.5 - 5.1 mmol/L 4.0  4.1  4.3   Chloride 98 - 111 mmol/L 106  104  103   CO2 22 - 32 mmol/L $RemoveB'21  20  23   'IoBpWDhY$ Calcium 8.9 - 10.3 mg/dL 7.4  7.4  7.3   Total Protein 6.5 - 8.1 g/dL 4.8     Total Bilirubin 0.3 - 1.2 mg/dL 2.2     Alkaline Phos 38 - 126 U/L 63     AST 15 - 41 U/L 30     ALT 0 - 44 U/L 15          Micro Results Recent Results (from the past 240 hour(s))  Urine Culture     Status: Abnormal   Collection Time: 01/07/22  2:55 PM   Specimen: Urine, Clean Catch  Result Value Ref Range Status   Specimen Description   Final    URINE, CLEAN CATCH Performed at [facility], [address], [place], [place] [address]    Special Requests   Final    NONE Performed at [facility], [address], [place], [place] [address]    Culture (A)  Final    >=100,000 COLONIES/mL MULTIPLE SPECIES PRESENT, SUGGEST RECOLLECTION   Report Status 01/09/2022 FINAL  Final  Surgical pcr screen     Status: None   Collection Time: 01/10/22  6:20 AM   Specimen: Nasal Mucosa; Nasal Swab  Result Value Ref Range Status   MRSA, PCR NEGATIVE NEGATIVE Final  Staphylococcus aureus NEGATIVE NEGATIVE Final    Comment: (NOTE) The Xpert SA Assay (FDA approved for NASAL specimens in patients 16 years of age and older), is one component of a comprehensive surveillance program. It is not intended to diagnose infection nor to guide or monitor treatment. Performed at [facility], [address], [place], [place] [address]   Culture, blood (Routine X 2) w Reflex to ID Panel     Status: None (Preliminary result)   Collection Time: 01/10/22 12:55 PM   Specimen: BLOOD  Result Value Ref Range Status   Specimen Description BLOOD LEFT ANTECUBITAL  Final   Special Requests   Final    BOTTLES DRAWN AEROBIC AND ANAEROBIC Blood Culture results may not be optimal due to an excessive volume of blood received in culture bottles Performed at [facility],  [address] [place], [place] [address]    Culture PENDING  Incomplete   Report Status PENDING  Incomplete  Culture, blood (Routine X 2) w Reflex to ID Panel     Status: None (Preliminary result)   Collection Time: 01/10/22 12:55 PM   Specimen: BLOOD  Result Value Ref Range Status   Specimen Description BLOOD BLOOD RIGHT HAND  Final   Special Requests   Final    BOTTLES DRAWN AEROBIC ONLY Blood Culture adequate volume Performed at [facility], [address] [place], [place] [address]    Culture PENDING  Incomplete   Report Status PENDING  Incomplete  MRSA Next Gen by PCR, Nasal     Status: None   Collection Time: 01/10/22  1:11 PM   Specimen: Nasal Mucosa; Nasal Swab  Result Value Ref Range Status   MRSA by PCR Next Gen NOT DETECTED NOT DETECTED Final    Comment: (NOTE) The GeneXpert MRSA Assay (FDA approved for NASAL specimens only), is one component of a comprehensive MRSA colonization surveillance program. It is not intended to diagnose MRSA infection nor to guide or monitor treatment for MRSA infections. Test performance is not FDA approved in patients less than 49 years old. Performed at [facility], [address], [place], [place] [address]     Radiology Reports DG CHEST PORT 1 VIEW  Result Date: 01/10/2022 CLINICAL DATA:  Status post endotracheal tube placement. EXAM: PORTABLE CHEST 1 VIEW COMPARISON:  January 10, 2022 (1:03 p.m.) FINDINGS: An endotracheal tube is seen with its distal tip approximately 6.3 cm from the carina. Stable nasogastric tube and right internal jugular venous catheter positioning is noted. The heart size and mediastinal contours are within normal limits. Mild, stable patchy airspace disease is seen within the mid to upper right lung with predominant stable bilateral pleural effusions. No pneumothorax is identified. The visualized skeletal structures are unremarkable. IMPRESSION: 1. Endotracheal tube, nasogastric tube and right internal jugular venous  catheter positioning, as described above. 2. Stable bilateral pleural effusions with stable mid to upper right lung airspace disease. Electronically Signed   By: Virgina Norfolk M.D.   On: 01/10/2022 23:52   DG Chest Port 1V same Day  Result Date: 01/10/2022 CLINICAL DATA:  Line adjustment EXAM: PORTABLE CHEST 1 VIEW COMPARISON:  Same day chest radiograph at 1156 hours FINDINGS: Interval retraction of the endotracheal tube which is now within the midthoracic trachea at the level of the aortic knob approximally 4.5 cm from the carina. Right IJ central venous catheter with tip in the right atrium. Nasogastric tube courses below the diaphragm with tip obscured by collimation. The heart size and mediastinal contours unchanged.  Aortic atherosclerosis. Small bilateral pleural effusions with adjacent airspace opacities. IMPRESSION: Interval retraction of the endotracheal tube which is now in the midthoracic trachea 4.5 cm from the carina. Electronically Signed   By: Dahlia Bailiff M.D.   On: 01/10/2022 13:24   DG Chest Port 1 View  Result Date: 01/10/2022 CLINICAL DATA:  Evaluate ET tube and NG tube placement. EXAM: PORTABLE CHEST 1 VIEW COMPARISON:  01/09/22 FINDINGS: The tip of the endotracheal tube is in the proximal right mainstem bronchus. Consider withdrawing by approximately 2 cm. The enteric tube tip and side port are below the GE junction. The enteric tube tip projects over the distal stomach. Right IJ catheter tip is in the inferior right atrium. No pneumothorax identified after catheter placement. There are small bilateral pleural effusions. The lung volumes are low and there is progressive atelectasis and/or consolidation involving the left lower lung. IMPRESSION: 1. ET tube tip is in the proximal right mainstem bronchus. Satisfactory position of the right IJ catheter and enteric tube. 2. Low lung volumes with progressive atelectasis and/or consolidation involving the left lower lung. 3. Small  bilateral pleural effusions. These results will be called to the ordering clinician or representative by the Radiologist Assistant, and communication documented in the PACS or [employer]. Electronically Signed   By: Kerby Moors M.D.   On: 01/10/2022 12:07    SIGNED: Deatra James, MD, FHM. > 66 minutes of critical care time was spent evaluating the patient, discussing plan of care with consultants, ICU team,-drawn plan of care and transfer to [facility] ICU  [facility],  Pager (please use [email or web address] to page/text) Please use Epic Secure Chat for non-urgent communication (7AM-7PM)  If 7PM-7AM, please contact night-coverage [email or web address], 01/11/2022, 10:45 AM

## 2022-01-11 NOTE — Progress Notes (Signed)
Patient ID: [name], female   DOB: 1946/01/20, 76 y.o.   MRN: [id number] [facility] Progress Note   Assessment/ Plan:   1. Acute kidney Injury: Baseline renal function essentially normal with a creatinine of 1.09 and now with what appears to be ischemic ATN from high-grade small bowel obstruction, likely third spacing and sustained renal hypoperfusion.  She was on ARB prior to admission.  Overnight with anuria that did not respond to diuretic challenge. Although she does not have any acute/compelling indications for renal replacement at this time, agree with plans to transfer to [facility] for the chance that she might need CRRT (currently on pressors).  Continue maintenance rate intravenous fluids at this time as she does not have clear indications of volume overload/access.  Respiratory status stable with arterial blood gas results reviewed from earlier today. 2.  Septic shock: Status post high-grade small bowel obstruction status post exploratory laparotomy with adhesiolysis.  Remains on pressors and maintenance fluids. 3.  High-grade small bowel obstruction: Status post exploratory laparotomy by Dr. Constance Haw yesterday with lysis of additions.  Ongoing intravenous fluid support. 4.  Anion gap metabolic acidosis: Mild and likely secondary to acute kidney injury versus ketoacidosis.  Monitor with ongoing intravenous fluids and decide on need for CRRT.  Subjective:   Anuric overnight and awaiting transfer to [facility] intensive care unit anticipating need for CRRT.   Objective:   BP (!) 129/43   Pulse 78   Temp 99 F (37.2 C) (Axillary)   Resp 20   Ht '5\' 4"'$  (1.626 m)   Wt 87.2 kg   SpO2 98%   BMI 33.00 kg/m   Intake/Output Summary (Last 24 hours) at 01/11/2022 1106 Last data filed at 01/11/2022 0951 Gross per 24 hour  Intake 4716.55 ml  Output 700 ml  Net 4016.55 ml   Weight change:   Physical Exam: Gen: Intubated, somnolent, resting comfortably in bed.   NGT in place CVS: Pulse regular rhythm, normal rate, S1 and S2 normal Resp: Intubated/on ventilator, anteriorly clear to auscultation, no rales/rhonchi to auscultation Abd: Soft, honeycomb dressing in place, no drainage noted Ext: Trace bilateral lower extremity edema with lichenification of skin noted  Imaging: DG CHEST PORT 1 VIEW  Result Date: 01/10/2022 CLINICAL DATA:  Status post endotracheal tube placement. EXAM: PORTABLE CHEST 1 VIEW COMPARISON:  January 10, 2022 (1:03 p.m.) FINDINGS: An endotracheal tube is seen with its distal tip approximately 6.3 cm from the carina. Stable nasogastric tube and right internal jugular venous catheter positioning is noted. The heart size and mediastinal contours are within normal limits. Mild, stable patchy airspace disease is seen within the mid to upper right lung with predominant stable bilateral pleural effusions. No pneumothorax is identified. The visualized skeletal structures are unremarkable. IMPRESSION: 1. Endotracheal tube, nasogastric tube and right internal jugular venous catheter positioning, as described above. 2. Stable bilateral pleural effusions with stable mid to upper right lung airspace disease. Electronically Signed   By: Virgina Norfolk M.D.   On: 01/10/2022 23:52   DG Chest Port 1V same Day  Result Date: 01/10/2022 CLINICAL DATA:  Line adjustment EXAM: PORTABLE CHEST 1 VIEW COMPARISON:  Same day chest radiograph at 1156 hours FINDINGS: Interval retraction of the endotracheal tube which is now within the midthoracic trachea at the level of the aortic knob approximally 4.5 cm from the carina. Right IJ central venous catheter with tip in the right atrium. Nasogastric tube courses below the diaphragm with tip obscured by collimation.  The heart size and mediastinal contours unchanged. Aortic atherosclerosis. Small bilateral pleural effusions with adjacent airspace opacities. IMPRESSION: Interval retraction of the endotracheal tube which is now  in the midthoracic trachea 4.5 cm from the carina. Electronically Signed   By: Dahlia Bailiff M.D.   On: 01/10/2022 13:24   DG Chest Port 1 View  Result Date: 01/10/2022 CLINICAL DATA:  Evaluate ET tube and NG tube placement. EXAM: PORTABLE CHEST 1 VIEW COMPARISON:  01/09/22 FINDINGS: The tip of the endotracheal tube is in the proximal right mainstem bronchus. Consider withdrawing by approximately 2 cm. The enteric tube tip and side port are below the GE junction. The enteric tube tip projects over the distal stomach. Right IJ catheter tip is in the inferior right atrium. No pneumothorax identified after catheter placement. There are small bilateral pleural effusions. The lung volumes are low and there is progressive atelectasis and/or consolidation involving the left lower lung. IMPRESSION: 1. ET tube tip is in the proximal right mainstem bronchus. Satisfactory position of the right IJ catheter and enteric tube. 2. Low lung volumes with progressive atelectasis and/or consolidation involving the left lower lung. 3. Small bilateral pleural effusions. These results will be called to the ordering clinician or representative by the Radiologist Assistant, and communication documented in the PACS or [employer]. Electronically Signed   By: Kerby Moors M.D.   On: 01/10/2022 12:07   CT ABDOMEN PELVIS WO CONTRAST  Result Date: 01/09/2022 CLINICAL DATA:  Acute abdominal pain, concern for thickened bowel EXAM: CT ABDOMEN AND PELVIS WITHOUT CONTRAST TECHNIQUE: Multidetector CT imaging of the abdomen and pelvis was performed following the standard protocol without IV contrast. RADIATION DOSE REDUCTION: This exam was performed according to the departmental dose-optimization program which includes automated exposure control, adjustment of the mA and/or kV according to patient size and/or use of iterative reconstruction technique. COMPARISON:  CT abdomen and pelvis 01/07/2022 FINDINGS: Lower chest: Small  right-greater-than-left pleural effusions and associated atelectasis/consolidation. Patchy infiltrates in both lower lung fields. Mild bronchial wall thickening. Hepatobiliary: No focal liver abnormality is seen. Status post cholecystectomy. No biliary dilatation. Mixed pancreatic atrophy. No peripancreatic inflammatory stranding or fluid. Pancreas: Unremarkable. Spleen: Normal in size without focal abnormality. Adrenals/Urinary Tract: Unremarkable adrenal glands. No hydronephrosis or urinary calculi. Nondistended bladder. Stomach/Bowel: Moderate to large fixed hiatal hernia. The stomach is distended with fluid mixed with oral contrast. Fluid within the lower thoracic esophagus. Dilation of the duodenum and jejunum with abrupt transition point in the right lower quadrant (circa series 2/image 55 and series 5/image 55). The small bowel is dilated measuring up to 4.6 cm in diameter. Similar to increased diffuse small bowel wall thickening with engorgement of the mesenteric vasculature. Mesenteric edema and small amount of free fluid in the abdomen and pelvis. No definite pneumatosis. Vascular/Lymphatic: Mild aortic atherosclerotic calcification. No suspicious abdominopelvic lymphadenopathy. Reproductive: Status post hysterectomy. No adnexal masses. Other: No free intraperitoneal air. Musculoskeletal: Posterior lumbar fusion at L4-S1. No acute osseous abnormality. Body wall anasarca. IMPRESSION: Abnormal dilation of the small bowel with transition point in the right lower quadrant compatible with high-grade bowel obstruction. The bowel wall is markedly thickened with engorged mesenteric vasculature. Evaluation for ischemia is limited without IV contrast. No free intraperitoneal air. No pneumatosis. Small right-greater-than-left pleural effusions and associated atelectasis/consolidation. Pneumonia is difficult to exclude. Moderate to large fixed hiatal hernia. Electronically Signed   By: Placido Sou M.D.   On:  01/09/2022 21:53    Labs: BMET Recent Labs  Lab 01/08/22  8850 01/08/22 1243 01/09/22 0415 01/10/22 0412 01/10/22 1255 01/10/22 1657 01/11/22 0356 01/11/22 0806  NA 145 143 145 135 136 136 136 136  K 2.7* 3.1* 4.7 4.9 4.3 4.3 4.1 4.0  CL 101 99 105 99 103 103 104 106  CO2 '31 29 25 26 24 23 '$ 20* 21*  GLUCOSE 105* 104* 87 139* 122* 98 122* 119*  BUN 53* 62* 75* 94* 87* 85* 88* 88*  CREATININE 2.17* 2.83* 3.72* 4.82* 4.66* 4.55* 4.79* 4.79*  CALCIUM 8.1* 7.7* 8.0* 7.7* 7.3* 7.3* 7.4* 7.4*  PHOS 4.8*  --   --   --  3.4 3.6 3.1  --    CBC Recent Labs  Lab 01/07/22 2055 01/08/22 0433 01/10/22 0412 01/11/22 0356  WBC 13.9* 13.1* 21.3* 23.0*  NEUTROABS  --   --   --  18.1*  HGB 11.5* 11.7* 11.1* 10.2*  HCT 35.6* 36.2 35.6* 31.8*  MCV 87.5 88.3 90.1 89.3  PLT 316 296 226 164    Medications:     Chlorhexidine Gluconate Cloth  6 each Topical Daily   heparin  5,000 Units Subcutaneous Q8H   mouth rinse  15 mL Mouth Rinse Q2H   pantoprazole (PROTONIX) IV  40 mg Intravenous Daily   sodium chloride flush  3 mL Intravenous Q12H    Elmarie Shiley, MD 01/11/2022, 11:06 AM

## 2022-01-11 NOTE — Progress Notes (Signed)
Initial Nutrition Assessment  DOCUMENTATION CODES:      INTERVENTION:  If patient able to wean once transferred. RD will continue to follow diet advancement and/or make additional recommendations.   NUTRITION DIAGNOSIS:   Inadequate oral intake related to acute illness as evidenced by NPO status.   GOAL:  Provide needs based on ASPEN/SCCM guidelines   MONITOR:  Vent status, I & O's, Weight trends, Skin (nutriton support measures)  REASON FOR ASSESSMENT:   Ventilator    ASSESSMENT:   PMH: HLD, HTN, depression. Reported vomiting and diarrhea since 8/11.  Patient is a 76 yo female with septic shock, high-grade small bowel obstruction.  8/17- status post exploratory laparotomy with lysis of adhesions. Acute kidney injury. Per Nephrology-pt anuric and is scheduled for transfer to [facility] for CRRT. 8/17-Right IJ.  Patient is currently intubated on ventilator support and pressor. Discussed with nursing. Patient is doing well and expecting for possible extubation once at [facility]. Patient has her eyes open. Need assessed based on current vent settings. RD's at [facility] notified of transfer.  NGT- 275 ml OP (low intermittent suction)  MV: 8.9 L/min Temp (24hrs), Avg:98.6 F (37 C), Min:97.7 F (36.5 C), Max:99.2 F (37.3 C)  Propofol: 9.36 ml/hr. (247 kcal - q 24hrs)  Levophed @ 37.5 ml/hr  IVF-D5/NS@ 125 ml/hr.   Intake/Output Summary (Last 24 hours) at 01/11/2022 1305 Last data filed at 01/11/2022 0951 Gross per 24 hour  Intake 3316.55 ml  Output 355 ml  Net 2961.55 ml   Since admission- +1.58 liters. Patient weight in July 78.7 kg- currently 87.2 kg.      Latest Ref Rng & Units 01/11/2022    8:06 AM 01/11/2022    3:56 AM 01/10/2022    4:57 PM  BMP  Glucose 70 - 99 mg/dL 119  122  98   BUN 8 - 23 mg/dL 88  88  85   Creatinine 0.44 - 1.00 mg/dL 4.79  4.79  4.55   Sodium 135 - 145 mmol/L 136  136  136   Potassium 3.5 - 5.1 mmol/L 4.0  4.1  4.3   Chloride 98 - 111 mmol/L 106   104  103   CO2 22 - 32 mmol/L '21  20  23   '$ Calcium 8.9 - 10.3 mg/dL 7.4  7.4  7.3      NUTRITION - FOCUSED PHYSICAL EXAM: Limited physical exam. Nursing bedside working with patient. Edematous upper and lower extremities. Patient received albumin x 2 per nursing.    Diet Order:   Diet Order             Diet NPO time specified  Diet effective now                   EDUCATION NEEDS:  Not appropriate for education at this time  Skin:  Skin Assessment: Skin Integrity Issues: Skin Integrity Issues:: Stage II Stage II: coccyx  Last BM:  8/15  Height:   Ht Readings from Last 1 Encounters:  01/11/22 '5\' 4"'$  (1.626 m)    Weight:   Wt Readings from Last 1 Encounters:  01/11/22 87.2 kg    Ideal Body Weight:   55 kg  BMI:  Body mass index is 33 kg/m.  Estimated Nutritional Needs:   Kcal:  1600-1800  Protein:  110-121 gr  Fluid:  per MD goal   Colman Cater MS,RD,CSG,LDN Contact: Shea Evans

## 2022-01-11 NOTE — Progress Notes (Signed)
[facility] Progress Note  1 Day Post-Op  Subjective: Minimal urine overnight. ET tube adjusted last night. Awakens when I talk to her and opens eyes. Follows commands. Seems comfortable.    Objective: Vital signs in last 24 hours: Temp:  [97.5 F (36.4 C)-99.2 F (37.3 C)] 99 F (37.2 C) (08/18 0800) Pulse Rate:  [66-78] 78 (08/18 1000) Resp:  [18-22] 20 (08/18 1000) BP: (85-152)/(43-63) 129/43 (08/18 1000) SpO2:  [91 %-100 %] 98 % (08/18 1000) Arterial Line BP: (94-143)/(30-68) 110/44 (08/18 1000) FiO2 (%):  [30 %-100 %] 30 % (08/18 0956) Weight:  [78.8 kg-87.2 kg] 87.2 kg (08/18 0500) Last BM Date : 01/08/22  Intake/Output from previous day: 08/17 0701 - 08/18 0700 In: 4431.5 [I.V.:3460.9; IV Piggyback:970.6] Out: 670 [Urine:70; Emesis/NG output:275; Blood:25] Intake/Output this shift: Total I/O In: 785.1 [I.V.:686.6; IV Piggyback:98.4] Out: 30 [Urine:30]  General appearance: intubated and sedated but follows commands and opens eyes GI: soft, honeycomb in place, no erythema or drainage   Lab Results:  Recent Labs    01/10/22 0412 01/11/22 0356  WBC 21.3* 23.0*  HGB 11.1* 10.2*  HCT 35.6* 31.8*  PLT 226 164   BMET Recent Labs    01/11/22 0356 01/11/22 0806  NA 136 136  K 4.1 4.0  CL 104 106  CO2 20* 21*  GLUCOSE 122* 119*  BUN 88* 88*  CREATININE 4.79* 4.79*  CALCIUM 7.4* 7.4*   PT/INR No results for input(s): "LABPROT", "INR" in the last 72 hours.  Studies/Results: DG CHEST PORT 1 VIEW  Result Date: 01/10/2022 CLINICAL DATA:  Status post endotracheal tube placement. EXAM: PORTABLE CHEST 1 VIEW COMPARISON:  January 10, 2022 (1:03 p.m.) FINDINGS: An endotracheal tube is seen with its distal tip approximately 6.3 cm from the carina. Stable nasogastric tube and right internal jugular venous catheter positioning is noted. The heart size and mediastinal contours are within normal limits. Mild, stable patchy airspace disease is seen within  the mid to upper right lung with predominant stable bilateral pleural effusions. No pneumothorax is identified. The visualized skeletal structures are unremarkable. IMPRESSION: 1. Endotracheal tube, nasogastric tube and right internal jugular venous catheter positioning, as described above. 2. Stable bilateral pleural effusions with stable mid to upper right lung airspace disease. Electronically Signed   By: Virgina Norfolk M.D.   On: 01/10/2022 23:52   DG Chest Port 1V same Day  Result Date: 01/10/2022 CLINICAL DATA:  Line adjustment EXAM: PORTABLE CHEST 1 VIEW COMPARISON:  Same day chest radiograph at 1156 hours FINDINGS: Interval retraction of the endotracheal tube which is now within the midthoracic trachea at the level of the aortic knob approximally 4.5 cm from the carina. Right IJ central venous catheter with tip in the right atrium. Nasogastric tube courses below the diaphragm with tip obscured by collimation. The heart size and mediastinal contours unchanged. Aortic atherosclerosis. Small bilateral pleural effusions with adjacent airspace opacities. IMPRESSION: Interval retraction of the endotracheal tube which is now in the midthoracic trachea 4.5 cm from the carina. Electronically Signed   By: Dahlia Bailiff M.D.   On: 01/10/2022 13:24   DG Chest Port 1 View  Result Date: 01/10/2022 CLINICAL DATA:  Evaluate ET tube and NG tube placement. EXAM: PORTABLE CHEST 1 VIEW COMPARISON:  01/09/22 FINDINGS: The tip of the endotracheal tube is in the proximal right mainstem bronchus. Consider withdrawing by approximately 2 cm. The enteric tube tip and side port are below the GE junction. The enteric tube tip projects over the  distal stomach. Right IJ catheter tip is in the inferior right atrium. No pneumothorax identified after catheter placement. There are small bilateral pleural effusions. The lung volumes are low and there is progressive atelectasis and/or consolidation involving the left lower lung.  IMPRESSION: 1. ET tube tip is in the proximal right mainstem bronchus. Satisfactory position of the right IJ catheter and enteric tube. 2. Low lung volumes with progressive atelectasis and/or consolidation involving the left lower lung. 3. Small bilateral pleural effusions. These results will be called to the ordering clinician or representative by the Radiologist Assistant, and communication documented in the PACS or [employer]. Electronically Signed   By: Kerby Moors M.D.   On: 01/10/2022 12:07   CT ABDOMEN PELVIS WO CONTRAST  Result Date: 01/09/2022 CLINICAL DATA:  Acute abdominal pain, concern for thickened bowel EXAM: CT ABDOMEN AND PELVIS WITHOUT CONTRAST TECHNIQUE: Multidetector CT imaging of the abdomen and pelvis was performed following the standard protocol without IV contrast. RADIATION DOSE REDUCTION: This exam was performed according to the departmental dose-optimization program which includes automated exposure control, adjustment of the mA and/or kV according to patient size and/or use of iterative reconstruction technique. COMPARISON:  CT abdomen and pelvis 01/07/2022 FINDINGS: Lower chest: Small right-greater-than-left pleural effusions and associated atelectasis/consolidation. Patchy infiltrates in both lower lung fields. Mild bronchial wall thickening. Hepatobiliary: No focal liver abnormality is seen. Status post cholecystectomy. No biliary dilatation. Mixed pancreatic atrophy. No peripancreatic inflammatory stranding or fluid. Pancreas: Unremarkable. Spleen: Normal in size without focal abnormality. Adrenals/Urinary Tract: Unremarkable adrenal glands. No hydronephrosis or urinary calculi. Nondistended bladder. Stomach/Bowel: Moderate to large fixed hiatal hernia. The stomach is distended with fluid mixed with oral contrast. Fluid within the lower thoracic esophagus. Dilation of the duodenum and jejunum with abrupt transition point in the right lower quadrant (circa series 2/image 55  and series 5/image 55). The small bowel is dilated measuring up to 4.6 cm in diameter. Similar to increased diffuse small bowel wall thickening with engorgement of the mesenteric vasculature. Mesenteric edema and small amount of free fluid in the abdomen and pelvis. No definite pneumatosis. Vascular/Lymphatic: Mild aortic atherosclerotic calcification. No suspicious abdominopelvic lymphadenopathy. Reproductive: Status post hysterectomy. No adnexal masses. Other: No free intraperitoneal air. Musculoskeletal: Posterior lumbar fusion at L4-S1. No acute osseous abnormality. Body wall anasarca. IMPRESSION: Abnormal dilation of the small bowel with transition point in the right lower quadrant compatible with high-grade bowel obstruction. The bowel wall is markedly thickened with engorged mesenteric vasculature. Evaluation for ischemia is limited without IV contrast. No free intraperitoneal air. No pneumatosis. Small right-greater-than-left pleural effusions and associated atelectasis/consolidation. Pneumonia is difficult to exclude. Moderate to large fixed hiatal hernia. Electronically Signed   By: Placido Sou M.D.   On: 01/09/2022 21:53    Anti-infectives: Anti-infectives (From admission, onward)    Start     Dose/Rate Route Frequency Ordered Stop   01/11/22 1400  aztreonam (AZACTAM) 1 g in sodium chloride 0.9 % 100 mL IVPB       See Hyperspace for full Linked Orders Report.   1 g 200 mL/hr over 30 Minutes Intravenous Every 8 hours 01/11/22 0815     01/11/22 1000  levofloxacin (LEVAQUIN) IVPB 500 mg  Status:  Discontinued        500 mg 100 mL/hr over 60 Minutes Intravenous Every 48 hours 01/10/22 1432 01/11/22 0744   01/11/22 0915  aztreonam (AZACTAM) 2 g in sodium chloride 0.9 % 100 mL IVPB       See Hyperspace  for full Linked Orders Report.   2 g 200 mL/hr over 30 Minutes Intravenous  Once 01/11/22 0815     01/11/22 0845  aztreonam (AZACTAM) 2 g in sodium chloride 0.9 % 100 mL IVPB  Status:   Discontinued        2 g 200 mL/hr over 30 Minutes Intravenous  Once 01/11/22 0747 01/11/22 0815   01/11/22 0845  metroNIDAZOLE (FLAGYL) IVPB 500 mg        500 mg 100 mL/hr over 60 Minutes Intravenous Every 12 hours 01/11/22 0747 01/18/22 0844   01/10/22 1900  levofloxacin (LEVAQUIN) IVPB 500 mg  Status:  Discontinued        500 mg 100 mL/hr over 60 Minutes Intravenous Every 24 hours 01/10/22 1426 01/10/22 1432   01/10/22 0700  ciprofloxacin (CIPRO) IVPB 400 mg        400 mg 200 mL/hr over 60 Minutes Intravenous On call to O.R. 01/10/22 0612 01/10/22 1052   01/10/22 0700  metroNIDAZOLE (FLAGYL) IVPB 500 mg  Status:  Discontinued        500 mg 100 mL/hr over 60 Minutes Intravenous On call to O.R. 01/10/22 0612 01/10/22 1123   01/08/22 0830  Levofloxacin (LEVAQUIN) IVPB 250 mg  Status:  Discontinued        250 mg 50 mL/hr over 60 Minutes Intravenous Every 24 hours 01/08/22 0724 01/08/22 0727   01/08/22 0830  Levofloxacin (LEVAQUIN) IVPB 250 mg  Status:  Discontinued        250 mg 50 mL/hr over 60 Minutes Intravenous Every 48 hours 01/08/22 0727 01/08/22 0732   01/08/22 0830  Levofloxacin (LEVAQUIN) IVPB 250 mg  Status:  Discontinued        250 mg 50 mL/hr over 60 Minutes Intravenous Every 24 hours 01/08/22 0732 01/10/22 1426       Assessment/Plan: Patient s/p Ex lap, lysis of adhesion and CVL placement for SBO. Needs to transfer for CRRT. She is on 9 of levophed. She is weaning on the ventilator.   Discussed with family and called [facility] to update them so they can follow her for the diet once she is clinically ready. I can see her in clinic for staple removal etc. I will let my clinic know, and tentatively place appt for 9/5.   LOS: 4 days    Virl Cagey 01/11/2022

## 2022-01-11 NOTE — Progress Notes (Signed)
Did not realize our hourly uop was being monitored s foley   Rec   Place foley to be sure we have accurate hourly uop  Christinia Gully, MD Pulmonary and [place] [phone]   After 7:00 pm call Elink  [phone]

## 2022-01-11 NOTE — Care Management Important Message (Signed)
Important Message  Patient Details  Name: Tattiana Fakhouri MRN: [id number] Date of Birth: 01-06-1946   Medicare Important Message Given:  Other (see comment)  Medicare IM withheld at this time pending transfer to [place].    Dannette Barbara 01/11/2022, 1:05 PM

## 2022-01-12 ENCOUNTER — Inpatient Hospital Stay (HOSPITAL_COMMUNITY): Payer: Medicare Other

## 2022-01-12 ENCOUNTER — Encounter (HOSPITAL_COMMUNITY): Payer: Self-pay | Admitting: Internal Medicine

## 2022-01-12 DIAGNOSIS — J9601 Acute respiratory failure with hypoxia: Secondary | ICD-10-CM | POA: Diagnosis not present

## 2022-01-12 DIAGNOSIS — Z9911 Dependence on respirator [ventilator] status: Secondary | ICD-10-CM | POA: Diagnosis not present

## 2022-01-12 DIAGNOSIS — R6521 Severe sepsis with septic shock: Secondary | ICD-10-CM | POA: Diagnosis not present

## 2022-01-12 DIAGNOSIS — N179 Acute kidney failure, unspecified: Secondary | ICD-10-CM | POA: Diagnosis not present

## 2022-01-12 DIAGNOSIS — A419 Sepsis, unspecified organism: Secondary | ICD-10-CM | POA: Diagnosis not present

## 2022-01-12 LAB — CBC WITH DIFFERENTIAL/PLATELET
Abs Immature Granulocytes: 0.8 10*3/uL — ABNORMAL HIGH (ref 0.00–0.07)
Basophils Absolute: 0.1 10*3/uL (ref 0.0–0.1)
Basophils Relative: 1 %
Eosinophils Absolute: 0.6 10*3/uL — ABNORMAL HIGH (ref 0.0–0.5)
Eosinophils Relative: 4 %
HCT: 27.7 % — ABNORMAL LOW (ref 36.0–46.0)
Hemoglobin: 8.8 g/dL — ABNORMAL LOW (ref 12.0–15.0)
Immature Granulocytes: 5 %
Lymphocytes Relative: 13 %
Lymphs Abs: 2 10*3/uL (ref 0.7–4.0)
MCH: 27.8 pg (ref 26.0–34.0)
MCHC: 31.8 g/dL (ref 30.0–36.0)
MCV: 87.7 fL (ref 80.0–100.0)
Monocytes Absolute: 1.2 10*3/uL — ABNORMAL HIGH (ref 0.1–1.0)
Monocytes Relative: 8 %
Neutro Abs: 11.2 10*3/uL — ABNORMAL HIGH (ref 1.7–7.7)
Neutrophils Relative %: 69 %
Platelets: 137 10*3/uL — ABNORMAL LOW (ref 150–400)
RBC: 3.16 MIL/uL — ABNORMAL LOW (ref 3.87–5.11)
RDW: 17.7 % — ABNORMAL HIGH (ref 11.5–15.5)
WBC: 15.9 10*3/uL — ABNORMAL HIGH (ref 4.0–10.5)
nRBC: 0.2 % (ref 0.0–0.2)

## 2022-01-12 LAB — COMPREHENSIVE METABOLIC PANEL
ALT: 14 U/L (ref 0–44)
ALT: 15 U/L (ref 0–44)
AST: 32 U/L (ref 15–41)
AST: 27 U/L (ref 15–41)
Albumin: 2 g/dL — ABNORMAL LOW (ref 3.5–5.0)
Albumin: 2 g/dL — ABNORMAL LOW (ref 3.5–5.0)
Alkaline Phosphatase: 57 U/L (ref 38–126)
Alkaline Phosphatase: 61 U/L (ref 38–126)
Anion gap: 7 (ref 5–15)
Anion gap: 8 (ref 5–15)
BUN: 91 mg/dL — ABNORMAL HIGH (ref 8–23)
BUN: 93 mg/dL — ABNORMAL HIGH (ref 8–23)
CO2: 19 mmol/L — ABNORMAL LOW (ref 22–32)
CO2: 18 mmol/L — ABNORMAL LOW (ref 22–32)
Calcium: 7 mg/dL — ABNORMAL LOW (ref 8.9–10.3)
Calcium: 7.3 mg/dL — ABNORMAL LOW (ref 8.9–10.3)
Chloride: 108 mmol/L (ref 98–111)
Chloride: 108 mmol/L (ref 98–111)
Creatinine, Ser: 5.32 mg/dL — ABNORMAL HIGH (ref 0.44–1.00)
Creatinine, Ser: 5.43 mg/dL — ABNORMAL HIGH (ref 0.44–1.00)
GFR, Estimated: 8 mL/min — ABNORMAL LOW (ref >60)
GFR, Estimated: 8 mL/min — ABNORMAL LOW (ref >60)
Glucose, Bld: 94 mg/dL (ref 70–99)
Glucose, Bld: 111 mg/dL — ABNORMAL HIGH (ref 70–99)
Potassium: 3.5 mmol/L (ref 3.5–5.1)
Potassium: 3.5 mmol/L (ref 3.5–5.1)
Sodium: 134 mmol/L — ABNORMAL LOW (ref 135–145)
Sodium: 134 mmol/L — ABNORMAL LOW (ref 135–145)
Total Bilirubin: 1.2 mg/dL (ref 0.3–1.2)
Total Bilirubin: 0.9 mg/dL (ref 0.3–1.2)
Total Protein: 4.2 g/dL — ABNORMAL LOW (ref 6.5–8.1)
Total Protein: 4.4 g/dL — ABNORMAL LOW (ref 6.5–8.1)

## 2022-01-12 LAB — MAGNESIUM
Magnesium: 2.1 mg/dL (ref 1.7–2.4)
Magnesium: 2.5 mg/dL — ABNORMAL HIGH (ref 1.7–2.4)

## 2022-01-12 LAB — PHOSPHORUS
Phosphorus: 3.1 mg/dL (ref 2.5–4.6)
Phosphorus: 3.4 mg/dL (ref 2.5–4.6)

## 2022-01-12 LAB — GLUCOSE, CAPILLARY
Glucose-Capillary: 103 mg/dL — ABNORMAL HIGH (ref 70–99)
Glucose-Capillary: 81 mg/dL (ref 70–99)
Glucose-Capillary: 84 mg/dL (ref 70–99)
Glucose-Capillary: 89 mg/dL (ref 70–99)
Glucose-Capillary: 93 mg/dL (ref 70–99)
Glucose-Capillary: 93 mg/dL (ref 70–99)

## 2022-01-12 LAB — RENAL FUNCTION PANEL
Albumin: 1.8 g/dL — ABNORMAL LOW (ref 3.5–5.0)
Anion gap: 10 (ref 5–15)
BUN: 86 mg/dL — ABNORMAL HIGH (ref 8–23)
CO2: 14 mmol/L — ABNORMAL LOW (ref 22–32)
Calcium: 7.6 mg/dL — ABNORMAL LOW (ref 8.9–10.3)
Chloride: 111 mmol/L (ref 98–111)
Creatinine, Ser: 4.83 mg/dL — ABNORMAL HIGH (ref 0.44–1.00)
GFR, Estimated: 9 mL/min — ABNORMAL LOW (ref >60)
Glucose, Bld: 113 mg/dL — ABNORMAL HIGH (ref 70–99)
Phosphorus: 3.4 mg/dL (ref 2.5–4.6)
Potassium: 3.4 mmol/L — ABNORMAL LOW (ref 3.5–5.1)
Sodium: 135 mmol/L (ref 135–145)

## 2022-01-12 LAB — MRSA NEXT GEN BY PCR, NASAL: MRSA by PCR Next Gen: NOT DETECTED

## 2022-01-12 MED ORDER — SODIUM CHLORIDE 0.9 % IV SOLN
500.0000 [IU]/h | INTRAVENOUS | Status: DC
  Administered 2022-01-12 – 2022-01-13 (×2): 500 [IU]/h via INTRAVENOUS_CENTRAL
  Filled 2022-01-12 (×2): qty 10000

## 2022-01-12 MED ORDER — IPRATROPIUM-ALBUTEROL 0.5-2.5 (3) MG/3ML IN SOLN
3.0000 mL | RESPIRATORY_TRACT | Status: DC
  Administered 2022-01-12: 3 mL via RESPIRATORY_TRACT
  Filled 2022-01-12: qty 3

## 2022-01-12 MED ORDER — ALBUTEROL SULFATE (2.5 MG/3ML) 0.083% IN NEBU
2.5000 mg | INHALATION_SOLUTION | Freq: Four times a day (QID) | RESPIRATORY_TRACT | Status: DC | PRN
  Administered 2022-01-19: 2.5 mg via RESPIRATORY_TRACT
  Filled 2022-01-12: qty 3

## 2022-01-12 MED ORDER — PRISMASOL BGK 4/2.5 32-4-2.5 MEQ/L REPLACEMENT SOLN
Status: DC
  Filled 2022-01-12 (×6): qty 5000

## 2022-01-12 MED ORDER — DOCUSATE SODIUM 50 MG/5ML PO LIQD: 100.0000 mg | Freq: Two times a day (BID) | ORAL | Status: DC

## 2022-01-12 MED ORDER — SODIUM CHLORIDE 0.9 % IV BOLUS
1000.0000 mL | Freq: Once | INTRAVENOUS | Status: AC
  Administered 2022-01-12: 1000 mL via INTRAVENOUS

## 2022-01-12 MED ORDER — FENTANYL 2500MCG IN NS 250ML (10MCG/ML) PREMIX INFUSION
25.0000 ug/h | INTRAVENOUS | Status: DC
  Administered 2022-01-12: 25 ug/h via INTRAVENOUS
  Filled 2022-01-12: qty 250

## 2022-01-12 MED ORDER — HEPARIN SODIUM (PORCINE) 1000 UNIT/ML DIALYSIS
1000.0000 [IU] | INTRAMUSCULAR | Status: DC | PRN
  Administered 2022-01-12: 2400 [IU] via INTRAVENOUS_CENTRAL
  Administered 2022-01-14: 2800 [IU] via INTRAVENOUS_CENTRAL
  Filled 2022-01-12: qty 3

## 2022-01-12 MED ORDER — MAGNESIUM SULFATE 2 GM/50ML IV SOLN
2.0000 g | Freq: Once | INTRAVENOUS | Status: AC
  Administered 2022-01-12: 2 g via INTRAVENOUS
  Filled 2022-01-12: qty 50

## 2022-01-12 MED ORDER — HEPARIN SODIUM (PORCINE) 1000 UNIT/ML IJ SOLN
INTRAMUSCULAR | Status: AC
  Filled 2022-01-12: qty 3

## 2022-01-12 MED ORDER — POLYETHYLENE GLYCOL 3350 17 G PO PACK: 17.0000 g | PACK | Freq: Every day | ORAL | Status: DC

## 2022-01-12 MED ORDER — MORPHINE SULFATE (PF) 2 MG/ML IV SOLN: 4.0000 mg | INTRAVENOUS | Status: DC | PRN

## 2022-01-12 MED ORDER — FENTANYL CITRATE PF 50 MCG/ML IJ SOSY
25.0000 ug | PREFILLED_SYRINGE | Freq: Once | INTRAMUSCULAR | Status: AC
  Administered 2022-01-12: 25 ug via INTRAVENOUS

## 2022-01-12 MED ORDER — ALTEPLASE 2 MG IJ SOLR
2.0000 mg | Freq: Once | INTRAMUSCULAR | Status: DC | PRN
  Filled 2022-01-12: qty 2

## 2022-01-12 MED ORDER — FENTANYL 2500MCG IN NS 250ML (10MCG/ML) PREMIX INFUSION
INTRAVENOUS | Status: AC
  Filled 2022-01-12: qty 250

## 2022-01-12 MED ORDER — SODIUM CHLORIDE 0.9 % IV SOLN
2.0000 g | Freq: Two times a day (BID) | INTRAVENOUS | Status: DC
  Administered 2022-01-13 – 2022-01-14 (×3): 2 g via INTRAVENOUS
  Filled 2022-01-12 (×4): qty 10

## 2022-01-12 MED ORDER — VANCOMYCIN HCL 1750 MG/350ML IV SOLN
1750.0000 mg | Freq: Once | INTRAVENOUS | Status: AC
  Administered 2022-01-12: 1750 mg via INTRAVENOUS
  Filled 2022-01-12: qty 350

## 2022-01-12 MED ORDER — CALCIUM GLUCONATE-NACL 1-0.675 GM/50ML-% IV SOLN
1.0000 g | Freq: Once | INTRAVENOUS | Status: AC
  Administered 2022-01-12: 1000 mg via INTRAVENOUS
  Filled 2022-01-12: qty 50

## 2022-01-12 MED ORDER — CALCIUM CHLORIDE 10 % IV SOLN
2.0000 g | Freq: Once | INTRAVENOUS | Status: AC
  Administered 2022-01-12: 2 g via INTRAVENOUS

## 2022-01-12 MED ORDER — FENTANYL BOLUS VIA INFUSION
25.0000 ug | INTRAVENOUS | Status: DC | PRN
  Administered 2022-01-13 (×2): 25 ug via INTRAVENOUS

## 2022-01-12 MED ORDER — SODIUM CHLORIDE 0.9 % FOR CRRT: INTRAVENOUS_CENTRAL | Status: DC | PRN

## 2022-01-12 MED ORDER — PRISMASOL BGK 4/2.5 32-4-2.5 MEQ/L EC SOLN
Status: DC
  Filled 2022-01-12 (×23): qty 5000

## 2022-01-12 MED ORDER — VANCOMYCIN HCL IN DEXTROSE 1-5 GM/200ML-% IV SOLN: 1000.0000 mg | INTRAVENOUS | Status: DC

## 2022-01-12 MED ORDER — IPRATROPIUM-ALBUTEROL 0.5-2.5 (3) MG/3ML IN SOLN
3.0000 mL | Freq: Three times a day (TID) | RESPIRATORY_TRACT | Status: DC
  Administered 2022-01-13 – 2022-01-15 (×7): 3 mL via RESPIRATORY_TRACT
  Filled 2022-01-12 (×7): qty 3

## 2022-01-12 MED ORDER — CALCIUM CHLORIDE 10 % IV SOLN
INTRAVENOUS | Status: AC
  Filled 2022-01-12: qty 10

## 2022-01-12 MED ORDER — HEPARIN SODIUM (PORCINE) 1000 UNIT/ML DIALYSIS: 1000.0000 [IU] | INTRAMUSCULAR | Status: DC | PRN

## 2022-01-12 MED ORDER — SODIUM CHLORIDE 0.9 % IV BOLUS
1000.0000 mL | Freq: Once | INTRAVENOUS | Status: AC
Start: 2022-01-12 — End: 2022-01-12
  Administered 2022-01-12: 1000 mL via INTRAVENOUS

## 2022-01-12 NOTE — Progress Notes (Signed)
Patient ID: [name], female   DOB: 1945/10/11, 76 y.o.   MRN: [id number] [facility] Progress Note   Subjective:   Oliguric and making 150- 400 cc / day of urine. Just transferred to [facility] from [place]. Opt in ICU CCM placing a line now.    Objective:   BP 102/63   Pulse (!) 102   Temp 100 F (37.8 C)   Resp (!) 22   Ht '5\' 4"'$  (1.626 m)   Wt 90.9 kg   SpO2 96%   BMI 34.40 kg/m    Physical Exam: Gen: Intubated, somnolent, resting comfortably in bed.  NGT in place CVS: Pulse regular rhythm, normal rate, S1 and S2 normal Resp: Intubated/on ventilator, anteriorly clear to auscultation, no rales/rhonchi to auscultation Abd: Soft, honeycomb dressing in place, no drainage noted Ext: Trace bilateral lower extremity edema with lichenification of skin noted  Assessment/ Plan:   1. Acute kidney Injury - baseline renal function essentially normal with a creatinine of 1.09 and now with what appears to be ischemic ATN from high-grade small bowel obstruction, likely third spacing and sustained renal hypoperfusion.  She was on ARB prior to admission.  AKI continues to worsen w/ creat 5.4 today and rising. UOP is marginal. RRT should be started. Pt has been trasnferred to [facility] and CCM is placing an HD line.  Plan is to start CRRT for progressive and severe AKI. Will follow.  2.  Septic shock - high-grade small bowel obstruction s/p exploratory laparotomy with adhesiolysis.  Remains on pressors. Per CCM (putting in HD cath), her CVP may be low, CVP is pending so we will keep her net + 50 cc/hr w/ CRRT for now.  3.  High-grade small bowel obstruction: Status post exploratory laparotomy by Dr. Constance Haw with lysis of additions.   4.  Anion gap metabolic acidosis: Mild and likely secondary to acute kidney injury versus ketoacidosis.  Should correct w/ RRT.    Kelly Splinter, MD 01/12/2022, 6:31 PM  Recent Labs  Lab 01/11/22 0356 01/11/22 0806 01/12/22 0344 01/12/22 1423  HGB 10.2*  --  8.8*   --   ALBUMIN  --    < > 2.0* 2.0*  CALCIUM 7.4*   < > 7.0* 7.3*  PHOS 3.1  --  3.1 3.4  CREATININE 4.79*   < > 5.32* 5.43*  K 4.1   < > 3.5 3.5   < > = values in this interval not displayed.    Medications:     calcium chloride       Chlorhexidine Gluconate Cloth  6 each Topical Daily   docusate  100 mg Per Tube BID   heparin  5,000 Units Subcutaneous Q8H   mouth rinse  15 mL Mouth Rinse Q2H   pantoprazole (PROTONIX) IV  40 mg Intravenous Daily   polyethylene glycol  17 g Per Tube Daily   sodium chloride flush  3 mL Intravenous Q12H

## 2022-01-12 NOTE — Progress Notes (Signed)
PROGRESS NOTE    Patient: [name]                            PCP: Chevis Pretty, FNP                    DOB: 09-Jun-1945            DOA: 01/07/2022 YCX:[id number]             DOS: 01/12/2022, 9:12 AM   LOS: 5 days   Date of Service: The patient was seen and examined on 01/12/2022  Subjective:   The patient was seen and examined this morning... Stable on the vent, on pressors Taking some urine now... Noted for worsening kidney function, On IV antibiotics, IV fluid D5 normal saline, Levophed, propofol  Tachycardia overnight otherwise stable  Patient will be transferred to [facility] - ICU   Brief Narrative:   76 yowf never smoker with "copd"(not supported by pfts)   hyperlipidemia, essential hypertension, hypothyroidism, pulmonary embolism in the past, osteoarthritis, and depression who presents from home with vomiting and diarrhea since 8/11 when  presented with  acute onset of abdominal pain CT scan was obtained and was unremarkable she received some IV fluids and antiemetics and was discharged home.  Her V and D persisted and when  previous treatment did not alleviate her symptoms   she presented to [facility] with    CT scan was obtained and found to show a partial small bowel obstruction and significant changes compared to the CT scan she had on 8/11      ED Course:  Patient was given an oral dose of Zofran and some IV fluid drip.  She continued to have persistent nausea in the emergency department and vomited at least 200 mL  witnessed  She was given additional Zofran at that point in her IV.  Also had 1 episode of diarrhea while here in the emergency department which is described as small vol. Labs were consistent with acute kidney injury and hypokalemia CT scan showeda partial small bowel obstruction.      CT scan was obtained and found to show a partial small bowel obstruction and significant changes compared to the CT scan she had on Friday.   To OR 68/17 Exploratory laparotomy  lysis of adhesion and remained on vent/ on pressors post op and PCCM service consulted      Assessment/Plan Principal Problem:   Partial small bowel obstruction (HCC) Active Problems:   COPD (chronic obstructive pulmonary disease) (HCC)   Hyperlipidemia   Hypothyroidism (acquired)   Essential hypertension   History of pulmonary embolus (PE)   Septic shock -Likely due to ischemic bowel,..  Night prior to surgery on 01/10/2022 patient became hypotensive with marked leukocytosis and acute renal failure -Exacerbated by ischemic small bowel obstruction -Status post full code laparotomy lysis of adhesion on 01/10/2022 -Status post aggressive IV fluid resuscitation with LR -Still meeting sepsis, septic shock criteria due to hypotension, marked leukocytosis, acute renal failure, acute respiratory failure -Focus sepsis protocol was initiated -Continue Levophed, continue patient intubated and on the vent -Critical care team following  -Initial antibiotics Levaquin has been broadened based on sepsis protocol -Remained hypotensive on vasopressors at 10 mcg of Levophed, on the vent for acute respiratory failure postop -Repeat ABG chest x-ray per PCCM  Acute respiratory failure -Remain intubated  -Postop 01/10/2022 s/p exploratory laparotomy lysis of adhesion Remain intubated... On the vent ABG/vent  management per PCCM Dr. Melvyn Novas following closely -Comfortable on the vent, no issues overnight  Patient will be transferred to [facility] ICU  PCCM and nephrology following closely possible need for CRRT   Small bowel obstruction: Postop day  #2  01/10/2022 status post urgent exploratory laparotomy by Dr. Constance Haw Lysis of adhesions, relieving small bowel obstruction  -Subsequent postop patient met sepsis, septic shock criteria due to hypotension, acute respiratory failure, acute renal failure    Acute renal insufficiency versus ATN -Likely exacerbated due to dehydration, SBO, hypertension (?  CKD -creatinine 1.09 on 01/04/2022)  -Worsening creatinine and kidney function -Progressive renal failure in the setting high-grade small bowel obstruction, possible ischemic bowel... No signs of renal artery obstructions UA possible UTI  Lab Results  Component Value Date   CREATININE 5.32 (H) 01/12/2022   CREATININE 5.14 (H) 01/11/2022   CREATININE 4.79 (H) 01/11/2022     BUN 53, 62, 75>>> 94, 91 -Monitoring, avoiding nephrotoxins --Consult nephrology: Appreciate input and close follow-up -Looks like the patient may need CRRT Pending transfer to [facility] today  COPD:  -On the vent -As needed DuoNeb bronchodilators   Hyperlipidemia: Patient manages her hyperlipidemia with diet control.   Hypothyroidism: Continue home dose Synthroid   Hypotensive -On pressor, Levophed at 10 mcg/h  Hypotensive/history of hypertension -Hypotensive, holding home BP meds -  as needed hydralazine. -On Levophed drip --titrating to MAP of 65  Remote history of pulmonary embolism: Noted.  Not currently anticoagulated as this was in the past.     OR  8/17  Exploratory laparotomy lysis of adhesion  ET   8/17    >>> R I J   8/17 >>>     Skin Assessment: I have examined the patient's skin and I agree with the wound assessment as performed by wound care team As outlined belowe: Pressure Injury 01/10/22 Coccyx Medial;Mid Stage 2 -  Partial thickness loss of dermis presenting as a shallow open injury with a red, pink wound bed without slough. pink (Active)  01/10/22 1200  Location: Coccyx  Location Orientation: Medial;Mid  Staging: Stage 2 -  Partial thickness loss of dermis presenting as a shallow open injury with a red, pink wound bed without slough.  Wound Description (Comments): pink  Present on Admission:   Dressing Type Foam - Lift dressing to assess site every shift 01/10/22 1200      ---------------------------------------------------------------------------------------------------------------------------------------------------- Cultures; Blood Cultures x 2 >> NGT Urine Culture  >>> NGT   -----------------------------------------------------------------------------------------------------------------------------------------  DVT prophylaxis:  heparin injection 5,000 Units Start: 01/07/22 2200   Code Status:   Code Status: Full Code  Family Communication: Discussed with daughter at bedside The above findings and plan of care has been discussed with patient (and family)  in detail,  they expressed understanding and agreement of above. -Advance care planning has been discussed.   Admission status:   Status is: Inpatient Remains inpatient appropriate because: Needing ICU admission, intubated Vent management, pressors for BP management, IV fluids, IV antibiotics, consultant input regarding acute renal failure, acute respiratory failure     Procedures:   No admission procedures for hospital encounter.   Antimicrobials:  Anti-infectives (From admission, onward)    Start     Dose/Rate Route Frequency Ordered Stop   01/11/22 1800  aztreonam (AZACTAM) 1 g in sodium chloride 0.9 % 100 mL IVPB       See Hyperspace for full Linked Orders Report.   1 g 200 mL/hr over 30 Minutes Intravenous Every 8 hours 01/11/22 0815  01/11/22 1000  levofloxacin (LEVAQUIN) IVPB 500 mg  Status:  Discontinued        500 mg 100 mL/hr over 60 Minutes Intravenous Every 48 hours 01/10/22 1432 01/11/22 0744   01/11/22 0915  aztreonam (AZACTAM) 2 g in sodium chloride 0.9 % 100 mL IVPB       See Hyperspace for full Linked Orders Report.   2 g 200 mL/hr over 30 Minutes Intravenous  Once 01/11/22 0815 01/11/22 1045   01/11/22 0845  aztreonam (AZACTAM) 2 g in sodium chloride 0.9 % 100 mL IVPB  Status:  Discontinued        2 g 200 mL/hr over 30 Minutes Intravenous  Once 01/11/22  0747 01/11/22 0815   01/11/22 0845  metroNIDAZOLE (FLAGYL) IVPB 500 mg        500 mg 100 mL/hr over 60 Minutes Intravenous Every 12 hours 01/11/22 0747 01/18/22 0844   01/10/22 1900  levofloxacin (LEVAQUIN) IVPB 500 mg  Status:  Discontinued        500 mg 100 mL/hr over 60 Minutes Intravenous Every 24 hours 01/10/22 1426 01/10/22 1432   01/10/22 0700  ciprofloxacin (CIPRO) IVPB 400 mg        400 mg 200 mL/hr over 60 Minutes Intravenous On call to O.R. 01/10/22 0612 01/10/22 1052   01/10/22 0700  metroNIDAZOLE (FLAGYL) IVPB 500 mg  Status:  Discontinued        500 mg 100 mL/hr over 60 Minutes Intravenous On call to O.R. 01/10/22 0612 01/10/22 1123   01/08/22 0830  Levofloxacin (LEVAQUIN) IVPB 250 mg  Status:  Discontinued        250 mg 50 mL/hr over 60 Minutes Intravenous Every 24 hours 01/08/22 0724 01/08/22 0727   01/08/22 0830  Levofloxacin (LEVAQUIN) IVPB 250 mg  Status:  Discontinued        250 mg 50 mL/hr over 60 Minutes Intravenous Every 48 hours 01/08/22 0727 01/08/22 0732   01/08/22 0830  Levofloxacin (LEVAQUIN) IVPB 250 mg  Status:  Discontinued        250 mg 50 mL/hr over 60 Minutes Intravenous Every 24 hours 01/08/22 0732 01/10/22 1426        Medication:   Chlorhexidine Gluconate Cloth  6 each Topical Daily   heparin  5,000 Units Subcutaneous Q8H   mouth rinse  15 mL Mouth Rinse Q2H   pantoprazole (PROTONIX) IV  40 mg Intravenous Daily   sodium chloride flush  3 mL Intravenous Q12H    sodium chloride, Place/Maintain arterial line **AND** sodium chloride, alum & mag hydroxide-simeth, LORazepam, morphine injection, morphine injection, [DISCONTINUED] ondansetron **OR** ondansetron (ZOFRAN) IV, mouth rinse, sodium chloride flush   Objective:   Vitals:   01/12/22 0600 01/12/22 0605 01/12/22 0610 01/12/22 0700  BP: 117/74     Pulse: 65 96    Resp: $Remo'19 18 18   'cebSE$ Temp: 99.5 F (37.5 C) 99.5 F (37.5 C) 99.5 F (37.5 C) 98.5 F (36.9 C)  TempSrc:    Axillary  SpO2:  97% 97%    Weight: 90.9 kg     Height:        Intake/Output Summary (Last 24 hours) at 01/12/2022 0912 Last data filed at 01/12/2022 0141 Gross per 24 hour  Intake 3753.34 ml  Output 200 ml  Net 3553.34 ml   Filed Weights   01/10/22 1131 01/11/22 0500 01/12/22 0600  Weight: 78.8 kg 87.2 kg 90.9 kg     Examination:       Physical Exam:  General:  Sedated intubated-comfortable;   HEENT:  ET tube and NG tube in place normocephalic, PERRL, otherwise with in Normal limits   Neuro:  \Limited exam - sedated  Lungs:   Clear to auscultation BL, Respirations unlabored,  No wheezes / crackles  Cardio:    S1/S2, tachycardic, No murmure, No Rubs or Gallops   Abdomen:  Soft, non-tender, bowel sounds active all four quadrants, no guarding or peritoneal signs. Abdominal surgical wound-clean, dressing in place  Muscular  skeletal:  Limited exam -sedated 2+ pulses,  symmetric, No pitting edema  Skin:  Dry, warm to touch, negative for any Rashes,  Wounds: Please see nursing documentation  Pressure Injury 01/10/22 Coccyx Medial;Mid Stage 2 -  Partial thickness loss of dermis presenting as a shallow open injury with a red, pink wound bed without slough. pink (Active)  01/10/22 1200  Location: Coccyx  Location Orientation: Medial;Mid  Staging: Stage 2 -  Partial thickness loss of dermis presenting as a shallow open injury with a red, pink wound bed without slough.  Wound Description (Comments): pink  Present on Admission:   Dressing Type Foam - Lift dressing to assess site every shift 01/10/22 1200        ------------------------------------------------------------------------------------------------------------------------------------------    LABs:     Latest Ref Rng & Units 01/12/2022    3:44 AM 01/11/2022    3:56 AM 01/10/2022    4:12 AM  CBC  WBC 4.0 - 10.5 K/uL 15.9  23.0  21.3   Hemoglobin 12.0 - 15.0 g/dL 8.8  10.2  11.1   Hematocrit 36.0 - 46.0 % 27.7  31.8  35.6    Platelets 150 - 400 K/uL 137  164  226       Latest Ref Rng & Units 01/12/2022    3:44 AM 01/11/2022   10:19 PM 01/11/2022    8:06 AM  CMP  Glucose 70 - 99 mg/dL 94  97  119   BUN 8 - 23 mg/dL 91  91  88   Creatinine 0.44 - 1.00 mg/dL 5.32  5.14  4.79   Sodium 135 - 145 mmol/L 134  134  136   Potassium 3.5 - 5.1 mmol/L 3.5  3.6  4.0   Chloride 98 - 111 mmol/L 108  108  106   CO2 22 - 32 mmol/L $RemoveB'19  18  21   'jwtCXXZw$ Calcium 8.9 - 10.3 mg/dL 7.0  7.0  7.4   Total Protein 6.5 - 8.1 g/dL 4.2  4.6  4.8   Total Bilirubin 0.3 - 1.2 mg/dL 1.2  1.4  2.2   Alkaline Phos 38 - 126 U/L 57  58  63   AST 15 - 41 U/L 32  29  30   ALT 0 - 44 U/L $Remo'14  13  15        'lfoKo$ Micro Results Recent Results (from the past 240 hour(s))  Urine Culture     Status: Abnormal   Collection Time: 01/07/22  2:55 PM   Specimen: Urine, Clean Catch  Result Value Ref Range Status   Specimen Description   Final    URINE, CLEAN CATCH Performed at [facility], [address], [place], [place] [address]    Special Requests   Final    NONE Performed at [facility], [address], [place], [place] [address]    Culture (A)  Final    >=100,000 COLONIES/mL MULTIPLE SPECIES PRESENT, SUGGEST RECOLLECTION   Report Status 01/09/2022 FINAL  Final  Surgical pcr  screen     Status: None   Collection Time: 01/10/22  6:20 AM   Specimen: Nasal Mucosa; Nasal Swab  Result Value Ref Range Status   MRSA, PCR NEGATIVE NEGATIVE Final   Staphylococcus aureus NEGATIVE NEGATIVE Final    Comment: (NOTE) The Xpert SA Assay (FDA approved for NASAL specimens in patients 58 years of age and older), is one component of a comprehensive surveillance program. It is not intended to diagnose infection nor to guide or monitor treatment. Performed at [facility], [address], [place], [place] [address]   Culture, blood (Routine X 2) w Reflex to ID Panel     Status: None (Preliminary result)   Collection Time: 01/10/22 12:55 PM   Specimen: BLOOD   Result Value Ref Range Status   Specimen Description BLOOD LEFT ANTECUBITAL  Final   Special Requests   Final    BOTTLES DRAWN AEROBIC AND ANAEROBIC Blood Culture results may not be optimal due to an excessive volume of blood received in culture bottles   Culture   Final    NO GROWTH 2 DAYS Performed at [facility], [address], [place], [place] [address]    Report Status PENDING  Incomplete  Culture, blood (Routine X 2) w Reflex to ID Panel     Status: None (Preliminary result)   Collection Time: 01/10/22 12:55 PM   Specimen: BLOOD  Result Value Ref Range Status   Specimen Description BLOOD BLOOD RIGHT HAND  Final   Special Requests   Final    BOTTLES DRAWN AEROBIC ONLY Blood Culture adequate volume   Culture   Final    NO GROWTH 2 DAYS Performed at [facility], [address], [place], [place] [address]    Report Status PENDING  Incomplete  MRSA Next Gen by PCR, Nasal     Status: None   Collection Time: 01/10/22  1:11 PM   Specimen: Nasal Mucosa; Nasal Swab  Result Value Ref Range Status   MRSA by PCR Next Gen NOT DETECTED NOT DETECTED Final    Comment: (NOTE) The GeneXpert MRSA Assay (FDA approved for NASAL specimens only), is one component of a comprehensive MRSA colonization surveillance program. It is not intended to diagnose MRSA infection nor to guide or monitor treatment for MRSA infections. Test performance is not FDA approved in patients less than 32 years old. Performed at [facility], [address], [place], [place] [address]     Radiology Reports No results found.  SIGNED: Deatra James, MD, FHM. > 66 minutes of critical care time was spent evaluating the patient, discussing plan of care with consultants, ICU team,-drawn plan of care and transfer to [facility] ICU  [facility],  Pager (please use [email or web address] to page/text) Please use Epic Secure Chat for non-urgent communication (7AM-7PM)  If 7PM-7AM, please contact  night-coverage [email or web address], 01/12/2022, 9:12 AM

## 2022-01-12 NOTE — Progress Notes (Signed)
Patient left with [facility]. Report given to RN. Report also called to [facility] 75M Nurse. Family notified that patient has left.

## 2022-01-12 NOTE — Procedures (Signed)
Central Venous Catheter Insertion Procedure Note  Chimene Salo  [id number]  1946/01/07  Date:01/12/22  Time:6:38 PM   Provider Performing:Amariana Mirando Naomie Dean   Procedure: Insertion of Non-tunneled Central Venous Catheter(36556)with US guidance (01601)    Indication(s) Hemodialysis  Consent Risks of the procedure as well as the alternatives and risks of each were explained to the patient and/or caregiver.  Consent for the procedure was obtained and is signed in the bedside chart  Anesthesia Topical only with 1% lidocaine   Timeout Verified patient identification, verified procedure, site/side was marked, verified correct patient position, special equipment/implants available, medications/allergies/relevant history reviewed, required imaging and test results available.  Sterile Technique Maximal sterile technique including full sterile barrier drape, hand hygiene, sterile gown, sterile gloves, mask, hair covering, sterile ultrasound probe cover (if used).  Procedure Description [place] used to identify appropriate anatomy prior to starting the procedure. LIJ nearly completely collapsible during inspiration despite MV. 1L bolus NS started and procedure performed in trendelenburg position. Area of catheter insertion was cleaned with chlorhexidine and draped in sterile fashion.   With real-time ultrasound guidance a HD catheter was placed into the left internal jugular vein.  Difficult to place due to low CVP-- negative pressure on syringe kept pulling back wall of the vein over the needle tip. No blood flow back from needle when syringe was removed from introducer needle. 1L saline was bolusing during catheter placement. Guidewire was able to be advanced and confirmed in place on [place] before dilation. Blood flow and easy flushing noted in all ports.  The catheter was sutured in place and sterile dressing applied.  Complications/Tolerance None; patient tolerated the procedure well. Chest X-ray is  ordered to verify placement for internal jugular or subclavian cannulation.  Chest x-ray is not ordered for femoral cannulation.  EBL Minimal  Specimen(s) None  Discussed volume status with nephrology. Planning to run + overnight.  Julian Hy, DO 01/12/22 6:40 PM [facility]

## 2022-01-12 NOTE — Progress Notes (Signed)
Patient arrived via [facility]. Phone consent obtained for placement of trialysis catheter by Dr. Carlis Abbott. Dr. Carlis Abbott to bedside.

## 2022-01-12 NOTE — Progress Notes (Signed)
[facility] called and they stated it would be close to 5PM before patient could be picked up to be trasnported. Camera operator and [facility] notified. Patients son called and updated on patient status and notified of bed assignment at [facility].

## 2022-01-12 NOTE — Progress Notes (Signed)
NAME:  [name], MRN:  [id number], DOB:  Jul 28, 1945, LOS: 5 ADMISSION DATE:  01/07/2022, CONSULTATION DATE:  8/17 REFERRING MD:  Rosie Fate, CHIEF COMPLAINT:  post op circulatory shock/ resp failure    History of Present Illness:  11 yowf never smoker with "copd"(not supported by pfts)   hyperlipidemia, essential hypertension, hypothyroidism, pulmonary embolism in the past, osteoarthritis, and depression who presents from home with vomiting and diarrhea since 8/11 when  presented with  acute onset of abdominal pain CT scan was obtained and was unremarkable she received some IV fluids and antiemetics and was discharged home.  Her V and D persisted and when  previous treatment did not alleviate her symptoms   she presented to [facility] with    CT scan was obtained and found to show a partial small bowel obstruction and significant changes compared to the CT scan she had on 8/11    ED Course: Patient was given an oral dose of Zofran and some IV fluid drip.  She continued to have persistent nausea in the emergency department and vomited at least 200 mL  witnessed  She was given additional Zofran at that point in her IV.  Also had 1 episode of diarrhea while here in the emergency department which is described as small vol. Labs were consistent with acute kidney injury and hypokalemia CT scan showeda partial small bowel obstruction.     To OR 68/17 Exploratory laparotomy lysis of adhesion and remained on vent/ on pressors post op and PCCM service consulted.   Worsening renal failure despite volume resuscitation and ongoing pressors and MV requirements. Transferred to [facility] for CRRT.     Significant Hospital Events: Including procedures, antibiotic start and stop dates in addition to other pertinent events   8/14 admitted; bowel obstruction managed medically 8/15 renal function worsening 8/17  Went into shock> emergent exploratory laparotomy w/ lysis of adhesion, intubated, RIJ CVC placed in OR 8/18  worsening renal function 8/19 arrived at [facility], starting CRRT    Interim History / Subjective:  Arrived from [facility] this evening on MV.   Objective   Blood pressure 102/63, pulse (!) 102, temperature 100 F (37.8 C), resp. rate 15, height _0  (1.626 m), weight 90.9 kg, SpO2 100 %. CVP:  [8 mmHg-14 mmHg] 13 mmHg  Vent Mode: PRVC FiO2 (%):  [30 %-35 %] 35 % Set Rate:  [18 bmp] 18 bmp Vt Set:  [440 mL] 440 mL PEEP:  [5 cmH20] 5 cmH20 Plateau Pressure:  [12 cmH20-19 cmH20] 12 cmH20   Intake/Output Summary (Last 24 hours) at 01/12/2022 1738 Last data filed at 01/12/2022 1604 Gross per 24 hour  Intake 4373.6 ml  Output 1115 ml  Net 3258.6 ml    Filed Weights   01/10/22 1131 01/11/22 0500 01/12/22 0600  Weight: 78.8 kg 87.2 kg 90.9 kg   CVP:  [8 mmHg-14 mmHg] 13 mmHg    Examination: General: critically ill appearing woman lying in bed in NAD HEENT: [place]/AT, eyes anicteric Cardio: S1S2, tachycardic, frequent PVCs Resp: mild vent dyssynchrony, thick tan ETT secretions Abd: soft, ND Extremities: no cyanosis or clubbing Derm: pallor, no rashes Neuro: RASS -4   8/17 blood cultures: NGTD Urine culture: polymicrobial  Na+ 134 Bicarb 18 BUN 93 Cr 5.43 WBC 15.9 H/H 8.8/27.7 Platelets 137   Assessment & Plan:  Septic shock s/p lap for SBO with acceptable cvp p fluid rescussitation RLL aspiration pneumonia -additional 2L volume resuscitation; I think previous CVPs were inaccurate -vasopressors to  maintain MAP >65 -con't aztreonam, flagyl -MRSA swab, empiric vanc -duonebs -trach aspirate culture -follow blood cultures -will notify GS tomorrow that she has arrived from [facility]; Dr. Constance Haw had previously been in contact with their service  Acute respiratory failure with hypoxia requiring MV -LTVV -VAP prevention protocol -PAD protocol for sedation -daily SAT & SBT as appropriate -trach aspirate culture  Frequent PVCs -2g calcium gluconate   -BMP  AKI - likely ATN from  sepsis and prolonged pre-renal state. ARB prior to admit -additional volume resuscitation -con't foley (changed at [facility] admit) -appreciate Nephro's management; running + tonight on CRRT due to intravascular depletion -STAT BMP -holding ARB -d/c morphine; can use fentanyl  Hyponatremia, likely hypovolemic -CRRT  Hypoglycemia -monitor, can start D5w+ thiamin if needed  Mild non-AG Met acidosis likely to AKI  -CRRT  Moderate protein energy malnutrition -TF vs TPN tomorrow; will discuss with surgery tomorrow  Chronic anemia -transfuse for Hb <7 or hemodynamically significant bleeding -monitor  Best Practice (right click and "Reselect all SmartList Selections" daily)  GI: PPI DVT: heparin Nutrition: TBD Lines & tubes: CVC, Aline, HD Family discussions:  Labs   CBC: Recent Labs  Lab 01/07/22 2055 01/08/22 0433 01/10/22 0412 01/11/22 0356 01/12/22 0344  WBC 13.9* 13.1* 21.3* 23.0* 15.9*  NEUTROABS  --   --   --  18.1* 11.2*  HGB 11.5* 11.7* 11.1* 10.2* 8.8*  HCT 35.6* 36.2 35.6* 31.8* 27.7*  MCV 87.5 88.3 90.1 89.3 87.7  PLT 316 296 226 164 137*     Basic Metabolic Panel: Recent Labs  Lab 01/08/22 0433 01/08/22 1243 01/10/22 1255 01/10/22 1657 01/11/22 0356 01/11/22 0806 01/11/22 2219 01/12/22 0344 01/12/22 1423  NA 145   < > 136 136 136 136 134* 134* 134*  K 2.7*   < > 4.3 4.3 4.1 4.0 3.6 3.5 3.5  CL 101   < > 103 103 104 106 108 108 108  CO2 31   < > 24 23 20* 21* 18* 19* 18*  GLUCOSE 105*   < > 122* 98 122* 119* 97 94 111*  BUN 53*   < > 87* 85* 88* 88* 91* 91* 93*  CREATININE 2.17*   < > 4.66* 4.55* 4.79* 4.79* 5.14* 5.32* 5.43*  CALCIUM 8.1*   < > 7.3* 7.3* 7.4* 7.4* 7.0* 7.0* 7.3*  MG 2.4  --   --   --  2.2  --  2.0 2.1 2.5*  PHOS 4.8*  --  3.4 3.6 3.1  --   --  3.1 3.4   < > = values in this interval not displayed.    GFR: Estimated Creatinine Clearance: 9.8 mL/min (A) (by C-G formula based on SCr of 5.43 mg/dL (H)). Recent Labs  Lab  01/08/22 0433 01/10/22 0412 01/10/22 1255 01/10/22 1535 01/11/22 0356 01/11/22 0806 01/11/22 0948 01/12/22 0344  WBC 13.1* 21.3*  --   --  23.0*  --   --  15.9*  LATICACIDVEN  --   --  2.0* 1.0  --  1.2 1.1  --      Liver Function Tests: Recent Labs  Lab 01/07/22 1227 01/10/22 1255 01/10/22 1657 01/11/22 0806 01/11/22 2219 01/12/22 0344 01/12/22 1423  AST 29  --   --  30 29 32 27  ALT 17  --   --  _0 ALKPHOS 97  --   --  63 58 57 61  BILITOT 1.2  --   --  2.2* 1.4*  1.2 0.9  PROT 7.5  --   --  4.8* 4.6* 4.2* 4.4*  ALBUMIN 4.0   < > 2.2* 2.3* 2.3* 2.0* 2.0*   < > = values in this interval not displayed.    Recent Labs  Lab 01/07/22 1227  LIPASE 23     ABG    Component Value Date/Time   PHART 7.35 01/11/2022 0512   PCO2ART 38 01/11/2022 0512   PO2ART 77 (L) 01/11/2022 0512   HCO3 21.0 01/11/2022 0512   ACIDBASEDEF 4.2 (H) 01/11/2022 0512   O2SAT 96.6 01/11/2022 0512     CBG: Recent Labs  Lab 01/11/22 2313 01/12/22 0401 01/12/22 0723 01/12/22 1121 01/12/22 1558  GLUCAP 102* 89 93 84 103*       This patient is critically ill with multiple organ system failure which requires frequent high complexity decision making, assessment, support, evaluation, and titration of therapies. This was completed through the application of advanced monitoring technologies and extensive interpretation of multiple databases. During this encounter critical care time was devoted to patient care services described in this note for 45 minutes.  Julian Hy, DO 01/12/22 7:16 PM [facility]

## 2022-01-12 NOTE — Progress Notes (Signed)
Dr Roger Shelter notified of patient having frequent nonsustained run of vtach of 6-8 beats every time. Occurrences are every few minutes. CMP ordered per MD.

## 2022-01-12 NOTE — Progress Notes (Signed)
Pharmacy Antibiotic Note  [name] is a 76 y.o. female admitted on 01/07/2022 with septic shock s/p ex-lap for SBO, RLL aspiration pneumonia.  Pharmacy has been consulted for vancomycin dosing. Initially started on Levaquin, then Cipro. Now transitioned to aztreonam and Flagyl. "Hives and swelling" documented as allergy to PCN and "swelling" with Keflex. Noted worsening AKI with plans to start on CRRT tonight.  Plan: Vancomycin '1750mg'$  IV x 1; then 1g (~'10mg'$ /kg) IV q24h Adjust aztreonam to 2g IV q12h for CRRT Flagyl '500mg'$  IV q12h Monitor clinical progress, c/s, abx plan/LOT Pre-HD vancomycin level as indicated F/u CRRT tolerance and Nephrology plans   Height: '5\' 4"'$  (162.6 cm) Weight: 90.9 kg (200 lb 6.4 oz) IBW/kg (Calculated) : 54.7  Temp (24hrs), Avg:99.7 F (37.6 C), Min:98.5 F (36.9 C), Max:100 F (37.8 C)  Recent Labs  Lab 01/07/22 2055 01/08/22 0433 01/08/22 1243 01/10/22 0412 01/10/22 1255 01/10/22 1535 01/10/22 1657 01/11/22 0356 01/11/22 0806 01/11/22 0948 01/11/22 2219 01/12/22 0344 01/12/22 1423  WBC 13.9* 13.1*  --  21.3*  --   --   --  23.0*  --   --   --  15.9*  --   CREATININE 1.82* 2.17*   < > 4.82* 4.66*  --    < > 4.79* 4.79*  --  5.14* 5.32* 5.43*  LATICACIDVEN  --   --   --   --  2.0* 1.0  --   --  1.2 1.1  --   --   --    < > = values in this interval not displayed.    Estimated Creatinine Clearance: 9.8 mL/min (A) (by C-G formula based on SCr of 5.43 mg/dL (H)).    Allergies  Allergen Reactions   Celebrex [Celecoxib] Swelling   Keflet [Cephalexin] Swelling   Penicillins Hives and Swelling    DID THE REACTION INVOLVE: Swelling of the face/tongue/throat, SOB, or low BP? Yes-swelling-hives Sudden or severe rash/hives, skin peeling, or the inside of the mouth or nose? Unknown Did it require medical treatment? Unknown When did it last happen?    over 10 years   If all above answers are "NO", may proceed with cephalosporin use.    Sulfa  Antibiotics Swelling   Kenalog [Triamcinolone Acetonide]     unknown   Latex    Lisinopril Other (See Comments)    unknown   Mobic [Meloxicam]     SWELLING    Arturo Morton, PharmD, BCPS Please check AMION for all [facility] contact numbers Clinical Pharmacist 01/12/2022 7:21 PM

## 2022-01-12 NOTE — Progress Notes (Signed)
[facility]  Still with minimal UOP and will be transferred for CRRT. Transfer pending 1030 per the RN.   Called and updated her Larkin Ina.  Abdomen soft, patient awakens with verbal stimuli and follows commands. NG in place.   Discussed  transfer and plans for CRRT and discussed that [facility] was notified yesterday of pending transfer.  Future Appointments  Date Time Provider [place]  01/29/2022 11:15 AM Virl Cagey, MD [facility] None  05/01/2022 11:20 AM Branch, Alphonse Guild, MD [facility] [place] [facility]  05/02/2022  2:15 PM Chevis Pretty, FNP [facility] None  12/02/2022  3:30 PM [facility]-ANNUAL WELLNESS VISIT [facility] None   I will see in a few weeks to get her staples out.  Curlene Labrum, MD [facility] [address] [place], [place] [address] [phone] (office)

## 2022-01-13 DIAGNOSIS — Z9911 Dependence on respirator [ventilator] status: Secondary | ICD-10-CM | POA: Diagnosis not present

## 2022-01-13 DIAGNOSIS — A419 Sepsis, unspecified organism: Secondary | ICD-10-CM | POA: Diagnosis not present

## 2022-01-13 DIAGNOSIS — J9601 Acute respiratory failure with hypoxia: Secondary | ICD-10-CM | POA: Diagnosis not present

## 2022-01-13 DIAGNOSIS — N179 Acute kidney failure, unspecified: Secondary | ICD-10-CM | POA: Diagnosis not present

## 2022-01-13 LAB — RENAL FUNCTION PANEL
Albumin: 1.9 g/dL — ABNORMAL LOW (ref 3.5–5.0)
Albumin: 1.9 g/dL — ABNORMAL LOW (ref 3.5–5.0)
Anion gap: 11 (ref 5–15)
Anion gap: 9 (ref 5–15)
BUN: 61 mg/dL — ABNORMAL HIGH (ref 8–23)
BUN: 43 mg/dL — ABNORMAL HIGH (ref 8–23)
CO2: 16 mmol/L — ABNORMAL LOW (ref 22–32)
CO2: 20 mmol/L — ABNORMAL LOW (ref 22–32)
Calcium: 7.7 mg/dL — ABNORMAL LOW (ref 8.9–10.3)
Calcium: 7.5 mg/dL — ABNORMAL LOW (ref 8.9–10.3)
Chloride: 110 mmol/L (ref 98–111)
Chloride: 108 mmol/L (ref 98–111)
Creatinine, Ser: 3.55 mg/dL — ABNORMAL HIGH (ref 0.44–1.00)
Creatinine, Ser: 2.56 mg/dL — ABNORMAL HIGH (ref 0.44–1.00)
GFR, Estimated: 13 mL/min — ABNORMAL LOW (ref >60)
GFR, Estimated: 19 mL/min — ABNORMAL LOW (ref >60)
Glucose, Bld: 124 mg/dL — ABNORMAL HIGH (ref 70–99)
Glucose, Bld: 157 mg/dL — ABNORMAL HIGH (ref 70–99)
Phosphorus: 2.6 mg/dL (ref 2.5–4.6)
Phosphorus: 2.3 mg/dL — ABNORMAL LOW (ref 2.5–4.6)
Potassium: 3.5 mmol/L (ref 3.5–5.1)
Potassium: 3.9 mmol/L (ref 3.5–5.1)
Sodium: 137 mmol/L (ref 135–145)
Sodium: 137 mmol/L (ref 135–145)

## 2022-01-13 LAB — CBC WITH DIFFERENTIAL/PLATELET
Abs Immature Granulocytes: 0 10*3/uL (ref 0.00–0.07)
Basophils Absolute: 0 10*3/uL (ref 0.0–0.1)
Basophils Relative: 0 %
Eosinophils Absolute: 0.4 10*3/uL (ref 0.0–0.5)
Eosinophils Relative: 3 %
HCT: 31.9 % — ABNORMAL LOW (ref 36.0–46.0)
Hemoglobin: 10.2 g/dL — ABNORMAL LOW (ref 12.0–15.0)
Lymphocytes Relative: 8 %
Lymphs Abs: 1.2 10*3/uL (ref 0.7–4.0)
MCH: 28 pg (ref 26.0–34.0)
MCHC: 32 g/dL (ref 30.0–36.0)
MCV: 87.6 fL (ref 80.0–100.0)
Monocytes Absolute: 1 10*3/uL (ref 0.1–1.0)
Monocytes Relative: 7 %
Neutro Abs: 12.1 10*3/uL — ABNORMAL HIGH (ref 1.7–7.7)
Neutrophils Relative %: 82 %
Platelets: 144 10*3/uL — ABNORMAL LOW (ref 150–400)
RBC: 3.64 MIL/uL — ABNORMAL LOW (ref 3.87–5.11)
RDW: 17.7 % — ABNORMAL HIGH (ref 11.5–15.5)
WBC: 14.7 10*3/uL — ABNORMAL HIGH (ref 4.0–10.5)
nRBC: 0.1 % (ref 0.0–0.2)
nRBC: 1 /100 WBC — ABNORMAL HIGH

## 2022-01-13 LAB — MAGNESIUM: Magnesium: 2.3 mg/dL (ref 1.7–2.4)

## 2022-01-13 LAB — GLUCOSE, CAPILLARY
Glucose-Capillary: 93 mg/dL (ref 70–99)
Glucose-Capillary: 91 mg/dL (ref 70–99)
Glucose-Capillary: 104 mg/dL — ABNORMAL HIGH (ref 70–99)
Glucose-Capillary: 139 mg/dL — ABNORMAL HIGH (ref 70–99)
Glucose-Capillary: 170 mg/dL — ABNORMAL HIGH (ref 70–99)
Glucose-Capillary: 167 mg/dL — ABNORMAL HIGH (ref 70–99)

## 2022-01-13 LAB — APTT: aPTT: 50 seconds — ABNORMAL HIGH (ref 24–36)

## 2022-01-13 LAB — HEPARIN LEVEL (UNFRACTIONATED): Heparin Unfractionated: 0.36 IU/mL (ref 0.30–0.70)

## 2022-01-13 MED ORDER — FENTANYL CITRATE PF 50 MCG/ML IJ SOSY
25.0000 ug | PREFILLED_SYRINGE | INTRAMUSCULAR | Status: DC | PRN
  Administered 2022-01-13: 50 ug via INTRAVENOUS
  Administered 2022-01-13: 100 ug via INTRAVENOUS
  Administered 2022-01-13 (×3): 50 ug via INTRAVENOUS
  Administered 2022-01-14 (×4): 100 ug via INTRAVENOUS
  Administered 2022-01-14: 50 ug via INTRAVENOUS
  Filled 2022-01-13 (×2): qty 1
  Filled 2022-01-13 (×4): qty 2
  Filled 2022-01-13: qty 1
  Filled 2022-01-13: qty 2
  Filled 2022-01-13: qty 1

## 2022-01-13 MED ORDER — METHYLPREDNISOLONE SODIUM SUCC 125 MG IJ SOLR
INTRAMUSCULAR | Status: AC
  Filled 2022-01-13: qty 2

## 2022-01-13 MED ORDER — AMIODARONE LOAD VIA INFUSION
150.0000 mg | Freq: Once | INTRAVENOUS | Status: AC
  Administered 2022-01-13: 150 mg via INTRAVENOUS
  Filled 2022-01-13: qty 83.34

## 2022-01-13 MED ORDER — AMIODARONE HCL IN DEXTROSE 360-4.14 MG/200ML-% IV SOLN: 30.0000 mg/h | INTRAVENOUS | Status: DC

## 2022-01-13 MED ORDER — INSULIN ASPART 100 UNIT/ML IJ SOLN
0.0000 [IU] | INTRAMUSCULAR | Status: DC
  Administered 2022-01-13: 2 [IU] via SUBCUTANEOUS
  Administered 2022-01-13 (×2): 3 [IU] via SUBCUTANEOUS
  Administered 2022-01-14: 2 [IU] via SUBCUTANEOUS
  Administered 2022-01-14: 3 [IU] via SUBCUTANEOUS

## 2022-01-13 MED ORDER — SODIUM CHLORIDE 0.9% FLUSH
10.0000 mL | Freq: Two times a day (BID) | INTRAVENOUS | Status: DC
  Administered 2022-01-13 – 2022-01-23 (×14): 10 mL
  Administered 2022-01-23: 20 mL
  Administered 2022-01-24 – 2022-02-04 (×20): 10 mL

## 2022-01-13 MED ORDER — FENTANYL CITRATE PF 50 MCG/ML IJ SOSY
25.0000 ug | PREFILLED_SYRINGE | INTRAMUSCULAR | Status: DC | PRN
  Filled 2022-01-13: qty 1

## 2022-01-13 MED ORDER — METHYLPREDNISOLONE SODIUM SUCC 125 MG IJ SOLR
125.0000 mg | Freq: Once | INTRAMUSCULAR | Status: AC
  Administered 2022-01-13: 125 mg via INTRAVENOUS
  Filled 2022-01-13: qty 2

## 2022-01-13 MED ORDER — AMIODARONE HCL IN DEXTROSE 360-4.14 MG/200ML-% IV SOLN
60.0000 mg/h | INTRAVENOUS | Status: AC
  Administered 2022-01-13 (×2): 60 mg/h via INTRAVENOUS
  Filled 2022-01-13: qty 400

## 2022-01-13 MED ORDER — HEPARIN (PORCINE) 25000 UT/250ML-% IV SOLN
1650.0000 [IU]/h | INTRAVENOUS | Status: DC
  Administered 2022-01-13 – 2022-01-14 (×2): 1100 [IU]/h via INTRAVENOUS
  Administered 2022-01-15: 1250 [IU]/h via INTRAVENOUS
  Administered 2022-01-16: 1550 [IU]/h via INTRAVENOUS
  Administered 2022-01-17: 1650 [IU]/h via INTRAVENOUS
  Administered 2022-01-17 – 2022-01-24 (×10): 1850 [IU]/h via INTRAVENOUS
  Administered 2022-01-25 (×2): 1700 [IU]/h via INTRAVENOUS
  Administered 2022-01-26 (×2): 1600 [IU]/h via INTRAVENOUS
  Administered 2022-01-27 – 2022-01-31 (×6): 1650 [IU]/h via INTRAVENOUS
  Filled 2022-01-13 (×27): qty 250

## 2022-01-13 MED ORDER — NOREPINEPHRINE 16 MG/250ML-% IV SOLN
0.0000 ug/min | INTRAVENOUS | Status: DC
  Administered 2022-01-13: 2 ug/min via INTRAVENOUS
  Filled 2022-01-13: qty 250

## 2022-01-13 MED ORDER — SODIUM CHLORIDE 0.9% FLUSH: 10.0000 mL | INTRAVENOUS | Status: DC | PRN

## 2022-01-13 MED ORDER — AMIODARONE HCL IN DEXTROSE 360-4.14 MG/200ML-% IV SOLN
30.0000 mg/h | INTRAVENOUS | Status: DC
  Administered 2022-01-13 – 2022-01-15 (×3): 30 mg/h via INTRAVENOUS
  Filled 2022-01-13 (×3): qty 200

## 2022-01-13 NOTE — Progress Notes (Signed)
New onset Afib, now in RVR -starting amiodarone, con't heparin  Julian Hy, DO 01/13/22 5:25 PM [facility]

## 2022-01-13 NOTE — Progress Notes (Signed)
NAME:  [name], MRN:  [id number], DOB:  [birth year], LOS: 6 ADMISSION DATE:  01/07/2022, CONSULTATION DATE:  8/17 REFERRING MD:  Rosie Fate, CHIEF COMPLAINT:  post op circulatory shock/ resp failure    History of Present Illness:  45 yowf never smoker with "copd"(not supported by pfts)   hyperlipidemia, essential hypertension, hypothyroidism, pulmonary embolism in the past, osteoarthritis, and depression who presents from home with vomiting and diarrhea since 8/11 when  presented with  acute onset of abdominal pain CT scan was obtained and was unremarkable she received some IV fluids and antiemetics and was discharged home.  Her V and D persisted and when  previous treatment did not alleviate her symptoms   she presented to [facility] with    CT scan was obtained and found to show a partial small bowel obstruction and significant changes compared to the CT scan she had on 8/11    ED Course: Patient was given an oral dose of Zofran and some IV fluid drip.  She continued to have persistent nausea in the emergency department and vomited at least 200 mL  witnessed  She was given additional Zofran at that point in her IV.  Also had 1 episode of diarrhea while here in the emergency department which is described as small vol. Labs were consistent with acute kidney injury and hypokalemia CT scan showeda partial small bowel obstruction.     To OR 68/17 Exploratory laparotomy lysis of adhesion and remained on vent/ on pressors post op and PCCM service consulted.   Worsening renal failure despite volume resuscitation and ongoing pressors and MV requirements. Transferred to [facility] for CRRT.     Significant Hospital Events: Including procedures, antibiotic start and stop dates in addition to other pertinent events   8/14 admitted; bowel obstruction managed medically 8/15 renal function worsening 8/17  Went into shock> emergent exploratory laparotomy w/ lysis of adhesion, intubated, RIJ CVC placed in OR 8/18  worsening renal function 8/19 arrived at [facility], starting CRRT   Interim History / Subjective:  She denies complaints. No acute events overnight.   Objective   Blood pressure (!) 118/56, pulse 100, temperature (!) 97.5 F (36.4 C), resp. rate 15, height _0  (1.626 m), weight 89.8 kg, SpO2 100 %. CVP:  [8 mmHg-15 mmHg] 11 mmHg  Vent Mode: PRVC FiO2 (%):  [35 %-50 %] 40 % Set Rate:  [18 bmp] 18 bmp Vt Set:  [440 mL] 440 mL PEEP:  [5 cmH20] 5 cmH20 Plateau Pressure:  [12 cmH20-21 cmH20] 21 cmH20   Intake/Output Summary (Last 24 hours) at 01/13/2022 0729 Last data filed at 01/13/2022 0700 Gross per 24 hour  Intake 6482.88 ml  Output 3182 ml  Net 3300.88 ml    Filed Weights   01/12/22 0600 01/12/22 1900 01/13/22 0500  Weight: 90.9 kg 89.5 kg 89.8 kg   CVP:  [8 mmHg-15 mmHg] 11 mmHg    Examination: General: critically ill appearing woman lying in bed in NAD, awake but fatigued appearing HEENT: [place]/AT, eyes anicteric, ETT, OGT in place Cardio: S1S2, RRR Resp: mild thick tan secretions, CTAB, breathing comfortably on 5/5 Abd: soft, ND, hypoactive bowel sounds. Still moderate gastric secretions from NGT. Extremities: no cyanosis or clubbing. Minimal edema. Derm: pallor, no diffuse rashes Neuro: RASS 0, weak but following commands   8/17 blood cultures: NGTD Urine culture: polymicrobial Trach aspirate> NGTD  Na+ 137 Bicarb 16 BUN 61 Cr 3.55 WBC 14.7 H/H 10.2/31.9 Platelets 144   Assessment & Plan:  Septic shock s/p lap for SBO, appears more euvolemic today. RLL aspiration pneumonia -con't broadened antibiotics> can stop vanc with neg MRSA -vasopressors to maintain MAP >65 -follow cultures until finalized -general surgery consulted at [facility] for continuation of post-op care  Acute respiratory failure with hypoxia requiring MV -LTVV -VAP prevention protocol -SAT & SBT> passed, extubate to [place] -pulmonary hygiene -follow trach aspirate culture  Failed cuff leak test  (w/ 7.0 tube) -steroids -recheck later  Frequent PVCs -con't electrolyte monitoring -echo to evaluate for change in EF since 2019  AKI - likely ATN from sepsis and prolonged pre-renal state. ARB prior to admit UOP improved with volume resuscitation on 8/19 -additional volume resuscitation given at admission; can be cautious with maintenance IVF -appreciate Nephrology's management, CRRT -con't foley; changed at [facility] admission -con't holding ARB  Hyponatremia, likely hypovolemic- resolved -monitor  Hypoglycemia -on D5w infusion  Mild non-AG Met acidosis likely to AKI  -CRRT  Moderate protein energy malnutrition -may need TPN if still needing prolonged bowel rest; will discuss with surgery  Chronic anemia -transfuse for Hb <7 or hemodynamically significant bleeding    Best Practice (right click and "Reselect all SmartList Selections" daily)  GI: PPI DVT: heparin Nutrition: TBD Lines & tubes: CVC, Aline, HD Family discussions:   Labs   CBC: Recent Labs  Lab 01/08/22 0433 01/10/22 0412 01/11/22 0356 01/12/22 0344 01/13/22 0448  WBC 13.1* 21.3* 23.0* 15.9* 14.7*  NEUTROABS  --   --  18.1* 11.2* 12.1*  HGB 11.7* 11.1* 10.2* 8.8* 10.2*  HCT 36.2 35.6* 31.8* 27.7* 31.9*  MCV 88.3 90.1 89.3 87.7 87.6  PLT 296 226 164 137* 144*     Basic Metabolic Panel: Recent Labs  Lab 01/11/22 0356 01/11/22 0806 01/11/22 2219 01/12/22 0344 01/12/22 1423 01/12/22 2123 01/13/22 0448  NA 136   < > 134* 134* 134* 135 137  K 4.1   < > 3.6 3.5 3.5 3.4* 3.5  CL 104   < > 108 108 108 111 110  CO2 20*   < > 18* 19* 18* 14* 16*  GLUCOSE 122*   < > 97 94 111* 113* 124*  BUN 88*   < > 91* 91* 93* 86* 61*  CREATININE 4.79*   < > 5.14* 5.32* 5.43* 4.83* 3.55*  CALCIUM 7.4*   < > 7.0* 7.0* 7.3* 7.6* 7.7*  MG 2.2  --  2.0 2.1 2.5*  --  2.3  PHOS 3.1  --   --  3.1 3.4 3.4 2.6   < > = values in this interval not displayed.    GFR: Estimated Creatinine Clearance: 14.9 mL/min (A)  (by C-G formula based on SCr of 3.55 mg/dL (H)). Recent Labs  Lab 01/10/22 0412 01/10/22 1255 01/10/22 1535 01/11/22 0356 01/11/22 0806 01/11/22 0948 01/12/22 0344 01/13/22 0448  WBC 21.3*  --   --  23.0*  --   --  15.9* 14.7*  LATICACIDVEN  --  2.0* 1.0  --  1.2 1.1  --   --      Liver Function Tests: Recent Labs  Lab 01/07/22 1227 01/10/22 1255 01/11/22 0806 01/11/22 2219 01/12/22 0344 01/12/22 1423 01/12/22 2123 01/13/22 0448  AST 29  --  30 29 32 27  --   --   ALT 17  --  _0 --   --   ALKPHOS 97  --  63 58 57 61  --   --   BILITOT 1.2  --  2.2* 1.4* 1.2 0.9  --   --   PROT 7.5  --  4.8* 4.6* 4.2* 4.4*  --   --   ALBUMIN 4.0   < > 2.3* 2.3* 2.0* 2.0* 1.8* 1.9*   < > = values in this interval not displayed.    Recent Labs  Lab 01/07/22 1227  LIPASE 23     ABG    Component Value Date/Time   PHART 7.35 01/11/2022 0512   PCO2ART 38 01/11/2022 0512   PO2ART 77 (L) 01/11/2022 0512   HCO3 21.0 01/11/2022 0512   ACIDBASEDEF 4.2 (H) 01/11/2022 0512   O2SAT 96.6 01/11/2022 0512     CBG: Recent Labs  Lab 01/12/22 1121 01/12/22 1558 01/12/22 1930 01/12/22 2330 01/13/22 0323  GLUCAP 84 103* 93 81 93       This patient is critically ill with multiple organ system failure which requires frequent high complexity decision making, assessment, support, evaluation, and titration of therapies. This was completed through the application of advanced monitoring technologies and extensive interpretation of multiple databases. During this encounter critical care time was devoted to patient care services described in this note for 40 minutes.  Julian Hy, DO 01/13/22 10:33 AM [facility]

## 2022-01-13 NOTE — Progress Notes (Signed)
ANTICOAGULATION CONSULT NOTE - Initial Consult  Pharmacy Consult for Heparin Indication: atrial fibrillation  Allergies  Allergen Reactions   Celebrex [Celecoxib] Swelling   Keflet [Cephalexin] Swelling   Penicillins Hives and Swelling    DID THE REACTION INVOLVE: Swelling of the face/tongue/throat, SOB, or low BP? Yes-swelling-hives Sudden or severe rash/hives, skin peeling, or the inside of the mouth or nose? Unknown Did it require medical treatment? Unknown When did it last happen?    over 10 years   If all above answers are "NO", may proceed with cephalosporin use.    Sulfa Antibiotics Swelling   Kenalog [Triamcinolone Acetonide]     unknown   Latex    Lisinopril Other (See Comments)    unknown   Mobic [Meloxicam]     SWELLING    Patient Measurements: Height: '5\' 4"'$  (162.6 cm) Weight: 89.8 kg (197 lb 15.6 oz) IBW/kg (Calculated) : 54.7 Heparin Dosing Weight: 71.5 kg  Vital Signs: Temp: 97.9 F (36.6 C) (08/20 1315) Pulse Rate: 126 (08/20 1315)  Labs: Recent Labs    01/11/22 0356 01/11/22 0806 01/12/22 0344 01/12/22 1423 01/12/22 2123 01/13/22 0448  HGB 10.2*  --  8.8*  --   --  10.2*  HCT 31.8*  --  27.7*  --   --  31.9*  PLT 164  --  137*  --   --  144*  APTT  --   --   --   --   --  50*  CREATININE 4.79*   < > 5.32* 5.43* 4.83* 3.55*   < > = values in this interval not displayed.    Estimated Creatinine Clearance: 14.9 mL/min (A) (by C-G formula based on SCr of 3.55 mg/dL (H)).   Medical History: Past Medical History:  Diagnosis Date   Allergy    Bronchitis, chronic (HCC)    Chronic anxiety    Complication of anesthesia    hard to wake up   COPD (chronic obstructive pulmonary disease) (HCC)    Depression    Esophageal reflux    Headache(784.0)    Hyperlipidemia    Hypertension    Hypothyroidism    Obesity    Osteoporosis    Overactive bladder    PVC's (premature ventricular contractions)    Thyroid disease    Vitamin D deficiency  disease      Assessment: POD#3 - status post ex lap with lysis of adhesions with atrial fibrillation overnight.  Pharmacy consulted to start heparin infusion. Patient was not on any AC PTA.  Is on CRRT and getting some heparin through the circuit.  Will titrate accordingly.  Goal of Therapy:  Heparin level 0.3-0.7 units/ml Monitor platelets by anticoagulation protocol: Yes   Plan:  Start heparin infusion at 1100 units/hr Check anti-Xa level in 8 hours and daily while on heparin Continue to monitor H&H and platelets  Alanda Slim, PharmD, [facility] Clinical Pharmacist Please see AMION for all Pharmacists' Contact Phone Numbers 01/13/2022, 2:04 PM

## 2022-01-13 NOTE — Progress Notes (Signed)
[facility] for Heparin Indication: atrial fibrillation  Allergies  Allergen Reactions   Celebrex [Celecoxib] Swelling   Keflet [Cephalexin] Swelling   Penicillins Hives and Swelling    DID THE REACTION INVOLVE: Swelling of the face/tongue/throat, SOB, or low BP? Yes-swelling-hives Sudden or severe rash/hives, skin peeling, or the inside of the mouth or nose? Unknown Did it require medical treatment? Unknown When did it last happen?    over 10 years   If all above answers are "NO", may proceed with cephalosporin use.    Sulfa Antibiotics Swelling   Kenalog [Triamcinolone Acetonide]     unknown   Latex    Lisinopril Other (See Comments)    unknown   Mobic [Meloxicam]     SWELLING    Patient Measurements: Height: '5\' 4"'$  (162.6 cm) Weight: 89.8 kg (197 lb 15.6 oz) IBW/kg (Calculated) : 54.7 Heparin Dosing Weight: 71.5 kg  Vital Signs: Temp: 98.4 F (36.9 C) (08/20 2245) Temp Source: Bladder (08/20 2200) BP: 138/67 (08/20 2018) Pulse Rate: 93 (08/20 2215)  Labs: Recent Labs    01/11/22 0356 01/11/22 0806 01/12/22 0344 01/12/22 1423 01/12/22 2123 01/13/22 0448 01/13/22 1521 01/13/22 2135  HGB 10.2*  --  8.8*  --   --  10.2*  --   --   HCT 31.8*  --  27.7*  --   --  31.9*  --   --   PLT 164  --  137*  --   --  144*  --   --   APTT  --   --   --   --   --  50*  --   --   HEPARINUNFRC  --   --   --   --   --   --   --  0.36  CREATININE 4.79*   < > 5.32*   < > 4.83* 3.55* 2.56*  --    < > = values in this interval not displayed.     Estimated Creatinine Clearance: 20.6 mL/min (A) (by C-G formula based on SCr of 2.56 mg/dL (H)).   Assessment: Pharmacy consulted to start heparin infusion for post-op afib. Patient was not on any AC PTA.    Pt on heparin in CRRT circuit (units/hr) + systemic heparin at 1100 units/hr. Heparin level therapeutic (0.36). No bleeding noted.  Goal of Therapy:  Heparin level 0.3-0.7 units/ml Monitor  platelets by anticoagulation protocol: Yes   Plan:  Continue systemic heparin at 1100 units/hr and CRRT heparin at 500 units/hr. Will f/u daily heparin level and CBC  Sherlon Handing, PharmD, BCPS Please see amion for complete clinical pharmacist phone list 01/13/2022, 11:18 PM

## 2022-01-13 NOTE — Progress Notes (Signed)
Extubation contraindicated at this time due to a negative cuff leak with ETT. MD notified, RN to give steroids and reevaluate with RRT in 4 hours.

## 2022-01-13 NOTE — Progress Notes (Signed)
PHARMACY - TOTAL PARENTERAL NUTRITION CONSULT NOTE   Indication: Prolonged ileus  Patient Measurements: Height: '5\' 4"'$  (162.6 cm) Weight: 89.8 kg (197 lb 15.6 oz) IBW/kg (Calculated) : 54.7 TPN AdjBW (KG): 60.7 Body mass index is 33.98 kg/m.  Assessment: 76 yo female with septic shock, high-grade small bowel obstruction. Underwent surgery on 8/17 with ex lap and lysis of adhesions for SBO. Reported vomiting and diarrhea since 8/11.  Pharmacy consulted for TPN due to prolonged ileus  Glucose / Insulin: CBGs 93- 124. Solumedrol 125 given x 1 today Electrolytes: Na 137, K 3.5, Cl 110, CO2 16, Mag 2.3, Phos 2.6 (want K>4 and Mag >2) - starting CRRT which should help with K Renal: BUN 61, SCR 3.55 - starting CRRT Hepatic: f/u in am Intake / Output; MIVF: positive fluid balance, UOP 296 mls, 1300 in NG output, 1450 removed by CRRT GI Imaging: GI Surgeries / Procedures: 8/17 with ex lap and lysis of adhesions for SBO.   Central access: CVC triple lumen TPN start date: 8/21  Nutritional Goals: Goal TPN rate is 75 mL/hr (provides 115 g of protein and 1800 kcals per day - 64 gm/l of protein, 15.3% dextrose, and 25 gm/l lf lipid)  RD Assessment: Estimated Needs Total Energy Estimated Needs: 1600-1800 Total Protein Estimated Needs: 110-121 gr Total Fluid Estimated Needs: per MD goal  Current Nutrition:  NPO  Plan:  TPN order received too late this afternoon, to start tonight.  Will plan to start on 8/.21 TPN labs ordered for am on 8/21   Alanda Slim, PharmD, [place] Clinical Pharmacist Please see AMION for all Pharmacists' Contact Phone Numbers 01/13/2022, 3:00 PM

## 2022-01-13 NOTE — Progress Notes (Signed)
Patient ID: [name], female   DOB: 28-Sep-1945, 76 y.o.   MRN: [id number] [facility] Progress Note   Subjective:   \SEen in room.  CVPs 0-1, given IVFs, starting to make urinr   Objective:   BP (!) 118/56   Pulse (!) 103   Temp 99 F (37.2 C)   Resp 16   Ht '5\' 4"'$  (1.626 m)   Wt 89.8 kg   SpO2 97%   BMI 33.98 kg/m    Physical Exam: Gen: Intubated, awake, resting comfortably in bed.  NGT in place CVS: Pulse regular rhythm, normal rate, S1 and S2 normal Resp: Intubated/on ventilator, anteriorly clear to auscultation, no rales/rhonchi to auscultation Abd: Soft, honeycomb dressing in place, no drainage noted Ext: Trace bilateral lower extremity edema with lichenification of skin noted, poor skin turgo  Assessment/ Plan:   1. Acute kidney Injury - baseline renal function essentially normal with a creatinine of 1.09 and now with what appears to be ischemic ATN from high-grade small bowel obstruction, likely third spacing and sustained renal hypoperfusion.  She was on ARB prior to admission.  AKI continues to worsen w/ creat 5.4 today and rising.   - on CRRT  - aggressively fluid resuscitated and now making some urine  - running positive  - reassess in AM for need for CRRT with UOP and labs  2.  Septic shock - high-grade small bowel obstruction s/p exploratory laparotomy with adhesiolysis.  Remains on pressors.   - abx and pressors per PCCM  3.  High-grade small bowel obstruction: Status post exploratory laparotomy by Dr. Constance Haw with lysis of additions.   4.  Anion gap metabolic acidosis: Mild and likely secondary to acute kidney injury versus ketoacidosis.  Should correct w/ RRT.     Recent Labs  Lab 01/12/22 0344 01/12/22 1423 01/12/22 2123 01/13/22 0448  HGB 8.8*  --   --  10.2*  ALBUMIN 2.0*   < > 1.8* 1.9*  CALCIUM 7.0*   < > 7.6* 7.7*  PHOS 3.1   < > 3.4 2.6  CREATININE 5.32*   < > 4.83* 3.55*  K 3.5   < > 3.4* 3.5   < > = values in this interval  not displayed.    Medications:     Chlorhexidine Gluconate Cloth  6 each Topical Daily   docusate  100 mg Per Tube BID   heparin  5,000 Units Subcutaneous Q8H   ipratropium-albuterol  3 mL Nebulization TID   mouth rinse  15 mL Mouth Rinse Q2H   pantoprazole (PROTONIX) IV  40 mg Intravenous Daily   polyethylene glycol  17 g Per Tube Daily   sodium chloride flush  3 mL Intravenous Q12H   Madelon Lips MD [facility]

## 2022-01-13 NOTE — Progress Notes (Signed)
Assessment & Plan: POD#3 - status post ex lap with lysis of adhesions - Dr. Constance Haw ([facility])  NPO, NG decompression  IVF - TNA ordered to begin today or tomorrow by CCM  Await resolution of ileus  Will follow with you  Patient seen and examined and discussed with Dr. Carlis Abbott from CCM.  POD#3 from lysis of adhesions at [facility].  Transferred for nephrology consult and CRRT.  General surgery will follow abdominal wound and monitor recovery from ileus.  Discussed with family at bedside.        Armandina Gemma, MD [facility] [place] [facility] Office: [phone]        Chief Complaint: SBO  Subjective: Patient in ICU, on vent, responsive.  Family at bedside.  Objective: Vital signs in last 24 hours: Temp:  [96.8 F (36 C)-100 F (37.8 C)] 97.5 F (36.4 C) (08/20 1215) Pulse Rate:  [84-127] 99 (08/20 1215) Resp:  [14-23] 17 (08/20 1215) BP: (94-152)/(48-71) 118/56 (08/19 2347) SpO2:  [93 %-100 %] 100 % (08/20 1215) Arterial Line BP: (103-160)/(36-77) 130/61 (08/20 1215) FiO2 (%):  [30 %-50 %] 30 % (08/20 1035) Weight:  [89.5 kg-89.8 kg] 89.8 kg (08/20 0500) Last BM Date : 01/12/22  Intake/Output from previous day: 08/19 0701 - 08/20 0700 In: 6482.9 [I.V.:3533.9; IV Piggyback:2949] Out: 3536 [Urine:296; Emesis/NG output:1300] Intake/Output this shift: Total I/O In: 738.6 [I.V.:638.6; IV Piggyback:100] Out: 488 [Urine:25; Emesis/NG output:50]  Physical Exam: HEENT - sclerae clear, mucous membranes moist Neck - soft, left IJ line in place Abdomen - soft without distension; minimal tenderness; no guarding; midline wound dry and intact with operative dressing in place  Lab Results:  Recent Labs    01/12/22 0344 01/13/22 0448  WBC 15.9* 14.7*  HGB 8.8* 10.2*  HCT 27.7* 31.9*  PLT 137* 144*   BMET Recent Labs    01/12/22 2123 01/13/22 0448  NA 135 137  K 3.4* 3.5  CL 111 110  CO2 14* 16*  GLUCOSE 113* 124*  BUN 86* 61*  CREATININE 4.83*  3.55*  CALCIUM 7.6* 7.7*   PT/INR No results for input(s): "LABPROT", "INR" in the last 72 hours. Comprehensive Metabolic Panel:    Component Value Date/Time   NA 137 01/13/2022 0448   NA 135 01/12/2022 2123   NA 136 10/30/2021 1544   NA 136 10/23/2021 1426   K 3.5 01/13/2022 0448   K 3.4 (L) 01/12/2022 2123   CL 110 01/13/2022 0448   CL 111 01/12/2022 2123   CO2 16 (L) 01/13/2022 0448   CO2 14 (L) 01/12/2022 2123   BUN 61 (H) 01/13/2022 0448   BUN 86 (H) 01/12/2022 2123   BUN 39 (H) 10/30/2021 1544   BUN 35 (H) 10/23/2021 1426   CREATININE 3.55 (H) 01/13/2022 0448   CREATININE 4.83 (H) 01/12/2022 2123   GLUCOSE 124 (H) 01/13/2022 0448   GLUCOSE 113 (H) 01/12/2022 2123   CALCIUM 7.7 (L) 01/13/2022 0448   CALCIUM 7.6 (L) 01/12/2022 2123   AST 27 01/12/2022 1423   AST 32 01/12/2022 0344   ALT 15 01/12/2022 1423   ALT 14 01/12/2022 0344   ALKPHOS 61 01/12/2022 1423   ALKPHOS 57 01/12/2022 0344   BILITOT 0.9 01/12/2022 1423   BILITOT 1.2 01/12/2022 0344   BILITOT 0.4 10/30/2021 1544   BILITOT 0.4 03/01/2021 1307   PROT 4.4 (L) 01/12/2022 1423   PROT 4.2 (L) 01/12/2022 0344   PROT 6.8 10/30/2021 1544   PROT 6.2 03/01/2021 1307  ALBUMIN 1.9 (L) 01/13/2022 0448   ALBUMIN 1.8 (L) 01/12/2022 2123   ALBUMIN 4.1 10/30/2021 1544   ALBUMIN 3.8 03/01/2021 1307    Studies/Results: DG CHEST PORT 1 VIEW  Result Date: 01/12/2022 CLINICAL DATA:  Central line placement. EXAM: PORTABLE CHEST 1 VIEW COMPARISON:  Chest radiograph 01/10/2022 FINDINGS: New left internal jugular dialysis catheter tip overlies the upper SVC. No pneumothorax. The endotracheal tube tip is now 2.8 cm from the carina. Right internal jugular central line and enteric tube remain in place. Unchanged bilateral pleural effusions and bilateral airspace disease. Stable cardiomegaly. IMPRESSION: 1. New left internal jugular dialysis catheter with tip overlying the upper SVC. No pneumothorax. 2. Endotracheal tube tip now  2.8 cm from the carina. Stable right central line and enteric tube. 3. Unchanged bilateral pleural effusions and bilateral airspace disease. Electronically Signed   By: Keith Rake M.D.   On: 01/12/2022 19:06      Armandina Gemma 01/13/2022   Patient ID: [name], female   DOB: June 23, 1945, 76 y.o.   MRN: [id number]

## 2022-01-14 ENCOUNTER — Encounter (HOSPITAL_COMMUNITY): Payer: Self-pay | Admitting: General Surgery

## 2022-01-14 ENCOUNTER — Inpatient Hospital Stay (HOSPITAL_COMMUNITY): Payer: Medicare Other

## 2022-01-14 DIAGNOSIS — A419 Sepsis, unspecified organism: Secondary | ICD-10-CM | POA: Diagnosis not present

## 2022-01-14 DIAGNOSIS — E440 Moderate protein-calorie malnutrition: Secondary | ICD-10-CM | POA: Insufficient documentation

## 2022-01-14 DIAGNOSIS — J9601 Acute respiratory failure with hypoxia: Secondary | ICD-10-CM | POA: Diagnosis not present

## 2022-01-14 DIAGNOSIS — I4891 Unspecified atrial fibrillation: Secondary | ICD-10-CM

## 2022-01-14 LAB — CBC
HCT: 28.9 % — ABNORMAL LOW (ref 36.0–46.0)
Hemoglobin: 9.4 g/dL — ABNORMAL LOW (ref 12.0–15.0)
MCH: 27.9 pg (ref 26.0–34.0)
MCHC: 32.5 g/dL (ref 30.0–36.0)
MCV: 85.8 fL (ref 80.0–100.0)
Platelets: 145 10*3/uL — ABNORMAL LOW (ref 150–400)
RBC: 3.37 MIL/uL — ABNORMAL LOW (ref 3.87–5.11)
RDW: 18 % — ABNORMAL HIGH (ref 11.5–15.5)
WBC: 13 10*3/uL — ABNORMAL HIGH (ref 4.0–10.5)
nRBC: 0.2 % (ref 0.0–0.2)

## 2022-01-14 LAB — RENAL FUNCTION PANEL
Albumin: 1.8 g/dL — ABNORMAL LOW (ref 3.5–5.0)
Anion gap: 5 (ref 5–15)
BUN: 23 mg/dL (ref 8–23)
CO2: 24 mmol/L (ref 22–32)
Calcium: 7.3 mg/dL — ABNORMAL LOW (ref 8.9–10.3)
Chloride: 107 mmol/L (ref 98–111)
Creatinine, Ser: 1.64 mg/dL — ABNORMAL HIGH (ref 0.44–1.00)
GFR, Estimated: 32 mL/min — ABNORMAL LOW (ref >60)
Glucose, Bld: 125 mg/dL — ABNORMAL HIGH (ref 70–99)
Phosphorus: 1.6 mg/dL — ABNORMAL LOW (ref 2.5–4.6)
Potassium: 3.6 mmol/L (ref 3.5–5.1)
Sodium: 136 mmol/L (ref 135–145)

## 2022-01-14 LAB — BPAM RBC
Blood Product Expiration Date: 202309222359
Blood Product Expiration Date: 202309222359
Unit Type and Rh: 5100
Unit Type and Rh: 5100

## 2022-01-14 LAB — GLUCOSE, CAPILLARY
Glucose-Capillary: 152 mg/dL — ABNORMAL HIGH (ref 70–99)
Glucose-Capillary: 121 mg/dL — ABNORMAL HIGH (ref 70–99)
Glucose-Capillary: 117 mg/dL — ABNORMAL HIGH (ref 70–99)
Glucose-Capillary: 112 mg/dL — ABNORMAL HIGH (ref 70–99)
Glucose-Capillary: 111 mg/dL — ABNORMAL HIGH (ref 70–99)
Glucose-Capillary: 115 mg/dL — ABNORMAL HIGH (ref 70–99)

## 2022-01-14 LAB — HEPARIN LEVEL (UNFRACTIONATED)
Heparin Unfractionated: 0.49 IU/mL (ref 0.30–0.70)
Heparin Unfractionated: 0.35 IU/mL (ref 0.30–0.70)

## 2022-01-14 LAB — CULTURE, RESPIRATORY W GRAM STAIN
Culture: NORMAL
Gram Stain: NONE SEEN

## 2022-01-14 LAB — TYPE AND SCREEN
ABO/RH(D): O POS
Antibody Screen: NEGATIVE
Unit division: 0
Unit division: 0

## 2022-01-14 LAB — COMPREHENSIVE METABOLIC PANEL
ALT: 16 U/L (ref 0–44)
AST: 20 U/L (ref 15–41)
Albumin: 1.9 g/dL — ABNORMAL LOW (ref 3.5–5.0)
Alkaline Phosphatase: 84 U/L (ref 38–126)
Anion gap: 9 (ref 5–15)
BUN: 31 mg/dL — ABNORMAL HIGH (ref 8–23)
CO2: 20 mmol/L — ABNORMAL LOW (ref 22–32)
Calcium: 7.4 mg/dL — ABNORMAL LOW (ref 8.9–10.3)
Chloride: 109 mmol/L (ref 98–111)
Creatinine, Ser: 2.08 mg/dL — ABNORMAL HIGH (ref 0.44–1.00)
GFR, Estimated: 24 mL/min — ABNORMAL LOW (ref >60)
Glucose, Bld: 158 mg/dL — ABNORMAL HIGH (ref 70–99)
Potassium: 3.7 mmol/L (ref 3.5–5.1)
Sodium: 138 mmol/L (ref 135–145)
Total Bilirubin: 0.7 mg/dL (ref 0.3–1.2)
Total Protein: 4.5 g/dL — ABNORMAL LOW (ref 6.5–8.1)

## 2022-01-14 LAB — ECHOCARDIOGRAM COMPLETE
AR max vel: 1.98 cm2
AV Peak grad: 15.9 mmHg
Ao pk vel: 2 m/s
Height: 64 in
S' Lateral: 3 cm
Weight: 3188.73 [oz_av]

## 2022-01-14 LAB — TRIGLYCERIDES: Triglycerides: 42 mg/dL (ref <150)

## 2022-01-14 LAB — PHOSPHORUS: Phosphorus: 1.9 mg/dL — ABNORMAL LOW (ref 2.5–4.6)

## 2022-01-14 LAB — MAGNESIUM: Magnesium: 2.4 mg/dL (ref 1.7–2.4)

## 2022-01-14 LAB — APTT: aPTT: 197 seconds (ref 24–36)

## 2022-01-14 MED ORDER — LIP MEDEX EX OINT
TOPICAL_OINTMENT | CUTANEOUS | Status: DC | PRN
  Filled 2022-01-14: qty 7

## 2022-01-14 MED ORDER — AZTREONAM 1 G IJ SOLR
1.0000 g | INTRAMUSCULAR | Status: DC
  Filled 2022-01-14: qty 5

## 2022-01-14 MED ORDER — ORAL CARE MOUTH RINSE: 15.0000 mL | OROMUCOSAL | Status: DC | PRN

## 2022-01-14 MED ORDER — ACETAMINOPHEN 10 MG/ML IV SOLN
1000.0000 mg | Freq: Four times a day (QID) | INTRAVENOUS | Status: AC
  Administered 2022-01-14 – 2022-01-15 (×4): 1000 mg via INTRAVENOUS
  Filled 2022-01-14 (×4): qty 100

## 2022-01-14 MED ORDER — FENTANYL CITRATE PF 50 MCG/ML IJ SOSY
12.5000 ug | PREFILLED_SYRINGE | Freq: Once | INTRAMUSCULAR | Status: AC
  Administered 2022-01-14: 12.5 ug via INTRAVENOUS
  Filled 2022-01-14: qty 1

## 2022-01-14 MED ORDER — SODIUM CHLORIDE 0.9 % IV SOLN
1.0000 g | INTRAVENOUS | Status: AC
  Administered 2022-01-15: 1 g via INTRAVENOUS
  Filled 2022-01-14: qty 5

## 2022-01-14 MED ORDER — HYDROMORPHONE HCL 1 MG/ML IJ SOLN
0.2500 mg | INTRAMUSCULAR | Status: DC | PRN
  Administered 2022-01-14: 0.25 mg via INTRAVENOUS
  Administered 2022-01-14 – 2022-01-15 (×3): 0.5 mg via INTRAVENOUS
  Filled 2022-01-14 (×4): qty 0.5

## 2022-01-14 MED ORDER — TRAVASOL 10 % IV SOLN
INTRAVENOUS | Status: AC
  Filled 2022-01-14: qty 529.2

## 2022-01-14 MED ORDER — FENTANYL CITRATE PF 50 MCG/ML IJ SOSY
12.5000 ug | PREFILLED_SYRINGE | INTRAMUSCULAR | Status: DC | PRN
  Filled 2022-01-14: qty 1

## 2022-01-14 MED FILL — Medication: Qty: 1 | Status: AC

## 2022-01-14 NOTE — Progress Notes (Signed)
Patient ID: [name], female   DOB: January 02, 1946, 76 y.o.   MRN: [id number] [facility] Progress Note   Subjective:    K 3.7, P 1.9; on CRRT All 4K bath Off NE this AM < 128m UOP reported past 24h,  Possible extubation today   Objective:   BP 130/80   Pulse (!) 108   Temp 98.2 F (36.8 C)   Resp 17   Ht '5\' 4"'$  (1.626 m)   Wt 90.4 kg   SpO2 93%   BMI 34.21 kg/m    Physical Exam: Gen: Intubated, awake, resting comfortably in bed.  NGT in place CVS: Pulse regular rhythm, normal rate, S1 and S2 normal Resp: Intubated/on ventilator, anteriorly clear to auscultation, no rales/rhonchi to auscultation Abd: Soft, honeycomb dressing in place, no drainage noted Ext: Trace bilateral lower extremity edema with lichenification of skin noted, poor skin turgo  Assessment/ Plan:   1. Dialysis dependent Acute kidney Injury - baseline renal function essentially normal with a creatinine of 1.09 and now with what appears to be ischemic ATN from high-grade small bowel obstruction, likely third spacing and sustained renal hypoperfusion.  She was on ARB prior to admission.   Off pressors Probably can stop CRRT today Might still req iHD, will follow   2.  Septic shock - high-grade small bowel obstruction s/p exploratory laparotomy with adhesiolysis.  Off pressors  3.  High-grade small bowel obstruction: Status post exploratory laparotomy by Dr. BConstance Hawwith lysis of additions.  CCS following  4.  Anion gap metabolic acidosis: Mild and likely secondary to acute kidney injury versus ketoacidosis.  Should correct w/ RRT.     Recent Labs  Lab 01/13/22 0448 01/13/22 1521 01/14/22 0400  HGB 10.2*  --  9.4*  ALBUMIN 1.9* 1.9* 1.9*  CALCIUM 7.7* 7.5* 7.4*  PHOS 2.6 2.3* 1.9*  CREATININE 3.55* 2.56* 2.08*  K 3.5 3.9 3.7     Medications:     Chlorhexidine Gluconate Cloth  6 each Topical Daily   insulin aspart  0-15 Units Subcutaneous Q4H   ipratropium-albuterol  3 mL  Nebulization TID   mouth rinse  15 mL Mouth Rinse Q2H   pantoprazole (PROTONIX) IV  40 mg Intravenous Daily   sodium chloride flush  10-40 mL Intracatheter Q12H   sodium chloride flush  3 mL Intravenous Q12H   RRexene Agent MD  [facility]

## 2022-01-14 NOTE — Progress Notes (Signed)
[place] for Heparin Indication: atrial fibrillation  Allergies  Allergen Reactions   Celebrex [Celecoxib] Swelling   Keflet [Cephalexin] Swelling   Penicillins Hives and Swelling    DID THE REACTION INVOLVE: Swelling of the face/tongue/throat, SOB, or low BP? Yes-swelling-hives Sudden or severe rash/hives, skin peeling, or the inside of the mouth or nose? Unknown Did it require medical treatment? Unknown When did it last happen?    over 10 years   If all above answers are "NO", may proceed with cephalosporin use.    Sulfa Antibiotics Swelling   Kenalog [Triamcinolone Acetonide]     unknown   Latex    Lisinopril Other (See Comments)    unknown   Mobic [Meloxicam]     SWELLING    Patient Measurements: Height: '5\' 4"'$  (162.6 cm) Weight: 90.4 kg (199 lb 4.7 oz) IBW/kg (Calculated) : 54.7 Heparin Dosing Weight: 71.5 kg  Vital Signs: Temp: 98.4 F (36.9 C) (08/21 1200) Temp Source: Bladder (08/21 1200) BP: 121/65 (08/21 1400) Pulse Rate: 102 (08/21 1400)  Labs: Recent Labs    01/12/22 0344 01/12/22 1423 01/13/22 0448 01/13/22 1521 01/13/22 2135 01/14/22 0400 01/14/22 1248  HGB 8.8*  --  10.2*  --   --  9.4*  --   HCT 27.7*  --  31.9*  --   --  28.9*  --   PLT 137*  --  144*  --   --  145*  --   APTT  --   --  50*  --   --  197*  --   HEPARINUNFRC  --   --   --   --  0.36 0.49 0.35  CREATININE 5.32*   < > 3.55* 2.56*  --  2.08*  --    < > = values in this interval not displayed.    Estimated Creatinine Clearance: 25.5 mL/min (A) (by C-G formula based on SCr of 2.08 mg/dL (H)).   Assessment: Pharmacy consulted to start heparin infusion for post-op afib. Patient was not on any AC PTA.    Systemic heparin at 1100 units/hr. Heparin level therapeutic (0.35). Off CRRT fixed dose heparin. No bleeding noted.  Goal of Therapy:  Heparin level 0.3-0.7 units/ml Monitor platelets by anticoagulation protocol: Yes   Plan:  Continue  systemic heparin at 1100 units/hr Will f/u daily heparin level and CBC  Sloan Leiter, PharmD, BCPS, BCCCP Clinical Pharmacist Please refer to [facility] for [place] numbers 01/14/2022, 3:06 PM

## 2022-01-14 NOTE — Progress Notes (Signed)
[place] for Heparin Indication: atrial fibrillation  Allergies  Allergen Reactions   Celebrex [Celecoxib] Swelling   Keflet [Cephalexin] Swelling   Penicillins Hives and Swelling    DID THE REACTION INVOLVE: Swelling of the face/tongue/throat, SOB, or low BP? Yes-swelling-hives Sudden or severe rash/hives, skin peeling, or the inside of the mouth or nose? Unknown Did it require medical treatment? Unknown When did it last happen?    over 10 years   If all above answers are "NO", may proceed with cephalosporin use.    Sulfa Antibiotics Swelling   Kenalog [Triamcinolone Acetonide]     unknown   Latex    Lisinopril Other (See Comments)    unknown   Mobic [Meloxicam]     SWELLING    Patient Measurements: Height: '5\' 4"'$  (162.6 cm) Weight: 90.4 kg (199 lb 4.7 oz) IBW/kg (Calculated) : 54.7 Heparin Dosing Weight: 71.5 kg  Vital Signs: Temp: 98.1 F (36.7 C) (08/21 0730) Temp Source: Bladder (08/21 0200) BP: 118/68 (08/21 0400) Pulse Rate: 91 (08/21 0730)  Labs: Recent Labs    01/12/22 0344 01/12/22 1423 01/13/22 0448 01/13/22 1521 01/13/22 2135 01/14/22 0400  HGB 8.8*  --  10.2*  --   --  9.4*  HCT 27.7*  --  31.9*  --   --  28.9*  PLT 137*  --  144*  --   --  145*  APTT  --   --  50*  --   --  197*  HEPARINUNFRC  --   --   --   --  0.36 0.49  CREATININE 5.32*   < > 3.55* 2.56*  --  2.08*   < > = values in this interval not displayed.    Estimated Creatinine Clearance: 25.5 mL/min (A) (by C-G formula based on SCr of 2.08 mg/dL (H)).   Assessment: Pharmacy consulted to start heparin infusion for post-op afib. Patient was not on any AC PTA.    Pt on fixed heparin in CRRT circuit (500 units/hr) + systemic heparin at 1100 units/hr. Heparin level therapeutic (0.49). aPTT this AM from A-line is prolonged at 197. No bleeding noted.  Goal of Therapy:  Heparin level 0.3-0.7 units/ml Monitor platelets by anticoagulation protocol:  Yes   Plan:  Continue systemic heparin at 1100 units/hr and Stop CRRT heparin at 500 units/hr - fixed rate per MD Recheck Heparin level in 8 hours Will f/u daily heparin level and CBC  Sloan Leiter, PharmD, BCPS, BCCCP Clinical Pharmacist Please refer to [facility] for [place] numbers 01/14/2022, 8:16 AM

## 2022-01-14 NOTE — Progress Notes (Signed)
PHARMACY - TOTAL PARENTERAL NUTRITION CONSULT NOTE   Indication: Prolonged ileus  Patient Measurements: Height: '5\' 4"'$  (162.6 cm) Weight: 89.8 kg (197 lb 15.6 oz) IBW/kg (Calculated) : 54.7 TPN AdjBW (KG): 60.7 Body mass index is 33.98 kg/m.  Assessment: 76 yo female with septic shock, high-grade small bowel obstruction. Underwent surgery on 8/17 with ex lap and lysis of adhesions for SBO. Reported vomiting and diarrhea since 8/11.  Pharmacy consulted for TPN due to prolonged ileus.  Glucose / Insulin: CBG  <180, 11 units mSSI last 24 hours (on D5-NS at 125 which will stop with start of TPN) Electrolytes: Na within normal limits.  K 3.7, Mag 2.4 (goal K >4, Mag >2), Phos 1.9, CO2 low 20, CoCa 9.1  Renal: AKI on CRRT - possibly stopping today. Hepatic: LFTs wnl, Alb 1.9 Intake / Output; MIVF: Net + 3L (+11 for admit) GI Imaging: GI Surgeries / Procedures:  8/17 with ex lap and lysis of adhesions for SBO.   Central access: Triple lumen CVC (placed 8/17) TPN start date: 01/14/22  Nutritional Goals: Goal TPN rate is 75 mL/hr (provides 115 g of protein and 1800 kcals per day - 64 gm/l of protein, 15.3% dextrose, and 25 gm/l lf lipid)  RD Assessment: Estimated Needs Total Energy Estimated Needs: 1600-1800 Total Protein Estimated Needs: 110-121 gr Total Fluid Estimated Needs: per MD goal  Current Nutrition:  NPO  Plan:  Start TPN at 35 mL/hr at 1800- providing 50% of nutrition goals.  Electrolytes in TPN: Removed due to changing renal status: Na 37mq/L, K 09m/L, Ca 63m13mL, Mg 63mE56m, and Phos 63mmo663m. Maximize Acetate for now. Add standard MVI and trace elements to TPN - removing chromium on CRRT Continue Moderate q4h SSI and adjust as needed  Reduce MIVF to 0 mL/hr at 1800 - as recommended per CCM team Monitor TPN labs on Mon/Thurs, Daily for next few days Nephrology helping to manage lytes - holding on extra replacement as CRRT to possibly stop today.   JessiSloan LeiterarmD, BCPS, BCCCP Clinical Pharmacist Please refer to [facility] 01/14/2022,7:03 AM

## 2022-01-14 NOTE — Progress Notes (Addendum)
Nutrition Follow-up / Consult  DOCUMENTATION CODES:   Non-severe (moderate) malnutrition in context of acute illness/injury  INTERVENTION:   TPN to meet 100% of nutrition needs.  Monitor magnesium, potassium, and phosphorus BID for at least 3 days, MD to replete as needed, as pt is at risk for refeeding syndrome given no nutrition x 7 days.  NUTRITION DIAGNOSIS:   Moderate Malnutrition related to acute illness (SBO) as evidenced by energy intake < 75% for > 7 days, mild muscle depletion, moderate fat depletion.  Ongoing   GOAL:   Patient will meet greater than or equal to 90% of their needs  Progressing with initiation of TPN  MONITOR:   I & O's, Labs, Skin, Diet advancement  REASON FOR ASSESSMENT:   Consult New TPN/TNA  ASSESSMENT:   76 yo female with septic shock, high-grade small bowel obstruction. 8/17- status post exploratory laparotomy with lysis of adhesions. Acute kidney injury. Per Nephrology-pt anuric and is scheduled for transfer to [facility] for CRRT. 8/17-Right IJ.  Discussed patient in ICU rounds and with RN today. 8/18 - Ex lap with LOA Currently receiving CRRT, hopeful to D/C today and transition to iHD.  TPN starting today via PICC.   Patient extubated this morning.   Labs reviewed. Phos 1.9 CBG: 167-152-121  Medications reviewed and include Novolog, Protonix. IVF: D5 NS at 125 ml/h, being discontinued when TPN starts today.   Admission weight 78.7 kg, up to 90.4 kg today with positive fluid status. Net IO Since Admission: 12,170.6 mL [01/14/22 1315]  Intake/Output Summary (Last 24 hours) at 01/14/2022 1315 Last data filed at 01/14/2022 1200 Gross per 24 hour  Intake 4205.56 ml  Output 710 ml  Net 3495.56 ml   Patient meets criteria for malnutrition with mild-moderate depletion of muscle and subcutaneous fat mass. She has been hospitalized for 7 days with no nutrition. Starting TPN today.   NUTRITION - FOCUSED PHYSICAL EXAM:  Flowsheet Row Most  Recent Value  Orbital Region Moderate depletion  Upper Arm Region No depletion  Thoracic and Lumbar Region No depletion  Buccal Region Moderate depletion  Temple Region Mild depletion  Clavicle Bone Region Mild depletion  Clavicle and Acromion Bone Region No depletion  Scapular Bone Region No depletion  Dorsal Hand No depletion  Patellar Region No depletion  Anterior Thigh Region No depletion  Posterior Calf Region No depletion  Edema (RD Assessment) Mild  Hair Reviewed  Eyes Reviewed  Mouth Unable to assess  Skin Reviewed  Nails Reviewed       Diet Order:   Diet Order             Diet NPO time specified  Diet effective now                   EDUCATION NEEDS:   Not appropriate for education at this time  Skin:  Skin Assessment: Skin Integrity Issues: Skin Integrity Issues:: Stage II Stage II: coccyx, L side of face  Last BM:  8/20 type 7  Height:   Ht Readings from Last 1 Encounters:  01/12/22 '5\' 4"'$  (1.626 m)    Weight:   Wt Readings from Last 1 Encounters:  01/14/22 90.4 kg    Ideal Body Weight:  54.5 kg  BMI:  Body mass index is 34.21 kg/m.  Estimated Nutritional Needs:   Kcal:  1700-1900  Protein:  100-120 gm  Fluid:  1.7-1.9 L   Lucas Mallow RD, LDN, CNSC Please refer to Amion for contact information.

## 2022-01-14 NOTE — Progress Notes (Signed)
Daughter-in-law updated at bedside.  Dacie Mandel M. Anjolie Majer, D.O.  Internal Medicine Resident, PGY-2 [facility]  3:49 PM, 01/14/2022

## 2022-01-14 NOTE — Progress Notes (Signed)
Pharmacy Antibiotic Note  [name] is a 76 y.o. female admitted on 01/07/2022 with septic shock s/p ex-lap for SBO, RLL aspiration pneumonia.  Pharmacy has been consulted for aztreonam and Flagyl. "Hives and swelling" documented as allergy to PCN and "swelling" with Keflex. Noted worsening AKI with plans to start on CRRT tonight.  Plan: Adjust aztreonam to 1g IV q24h now stopping CRRT with end date of 8/22 for total of 5 days as discussed with Dr. Howie Ill. Flagyl '500mg'$  IV q12h Monitor clinical progress, c/s, abx plan/LOT F/u CRRT tolerance and Nephrology plans   Height: '5\' 4"'$  (162.6 cm) Weight: 90.4 kg (199 lb 4.7 oz) IBW/kg (Calculated) : 54.7  Temp (24hrs), Avg:98.2 F (36.8 C), Min:96.6 F (35.9 C), Max:99.1 F (37.3 C)  Recent Labs  Lab 01/10/22 0412 01/10/22 1255 01/10/22 1535 01/10/22 1657 01/11/22 0356 01/11/22 0806 01/11/22 0948 01/11/22 2219 01/12/22 0344 01/12/22 1423 01/12/22 2123 01/13/22 0448 01/13/22 1521 01/14/22 0400  WBC 21.3*  --   --   --  23.0*  --   --   --  15.9*  --   --  14.7*  --  13.0*  CREATININE 4.82* 4.66*  --    < > 4.79* 4.79*  --    < > 5.32* 5.43* 4.83* 3.55* 2.56* 2.08*  LATICACIDVEN  --  2.0* 1.0  --   --  1.2 1.1  --   --   --   --   --   --   --    < > = values in this interval not displayed.    Estimated Creatinine Clearance: 25.5 mL/min (A) (by C-G formula based on SCr of 2.08 mg/dL (H)).    Allergies  Allergen Reactions   Celebrex [Celecoxib] Swelling   Keflet [Cephalexin] Swelling   Penicillins Hives and Swelling    DID THE REACTION INVOLVE: Swelling of the face/tongue/throat, SOB, or low BP? Yes-swelling-hives Sudden or severe rash/hives, skin peeling, or the inside of the mouth or nose? Unknown Did it require medical treatment? Unknown When did it last happen?    over 10 years   If all above answers are "NO", may proceed with cephalosporin use.    Sulfa Antibiotics Swelling   Kenalog [Triamcinolone Acetonide]      unknown   Latex    Lisinopril Other (See Comments)    unknown   Mobic [Meloxicam]     SWELLING    Sloan Leiter, PharmD, BCPS, BCCCP Clinical Pharmacist Please refer to [facility] for [place] numbers 01/14/2022 3:32 PM

## 2022-01-14 NOTE — Procedures (Signed)
Extubation Procedure Note  Patient Details:   Name: Simmone Cape DOB: Nov 14, 1945 MRN: [id number]   Airway Documentation:    Vent end date: 01/14/22 Vent end time: 1145   Evaluation  O2 sats: stable throughout Complications: No apparent complications Patient did tolerate procedure well. Bilateral Breath Sounds: Clear, Diminished   Yes   Alvera Singh 01/14/2022, 11:51 AM

## 2022-01-14 NOTE — Progress Notes (Signed)
NAME:  [name], MRN:  [id number], DOB:  01-08-1946, LOS: 7 ADMISSION DATE:  01/07/2022, CONSULTATION DATE:  8/17 REFERRING MD:  Rosie Fate, CHIEF COMPLAINT:  post op circulatory shock/ resp failure    History of Present Illness:  74 yowf never smoker with "COPD" (not supported by pfts)   hyperlipidemia, essential hypertension, hypothyroidism, pulmonary embolism in the past, osteoarthritis, and depression who presents from home with vomiting and diarrhea since 8/11 when  presented with  acute onset of abdominal pain CT scan was obtained and was unremarkable she received some IV fluids and antiemetics and was discharged home.  Her V and D persisted and when  previous treatment did not alleviate her symptoms   she presented to [facility].  CT scan was obtained and found to show a partial small bowel obstruction and significant changes compared to the CT scan she had on 8/11.   ED Course: Patient was given an oral dose of Zofran and some IV fluid drip.  She continued to have persistent nausea in the emergency department and vomited at least 200 mL  witnessed  She was given additional Zofran at that point in her IV.  Also had 1 episode of diarrhea in the emergency department which is described as small vol. Labs were consistent with acute kidney injury and hypokalemia CT scan showed a partial small bowel obstruction.     To OR 8/17 Exploratory laparotomy lysis of adhesion and remained on vent/ on pressors post op and PCCM service consulted.   Worsening renal failure despite volume resuscitation and ongoing pressors and MV requirements. Transferred to [facility] for CRRT.     Significant Hospital Events: Including procedures, antibiotic start and stop dates in addition to other pertinent events   8/14 admitted; bowel obstruction managed medically 8/15 renal function worsening 8/17  Went into shock> emergent exploratory laparotomy w/ lysis of adhesion, intubated, RIJ CVC placed in OR 8/18 worsening renal  function 8/19 arrived at [facility], starting CRRT 8/21 extubated, TNA started, CRRT discontinued  Interim History / Subjective:  On and off levophed overnight.  Subjective: Following commands  Objective   Blood pressure 118/68, pulse (!) 103, temperature 98.1 F (36.7 C), resp. rate (!) 21, height _0  (1.626 m), weight 90.4 kg, SpO2 94 %. CVP:  [11 mmHg-15 mmHg] 11 mmHg  Vent Mode: PRVC FiO2 (%):  [30 %] 30 % Set Rate:  [18 bmp] 18 bmp Vt Set:  [40 mL-440 mL] 440 mL PEEP:  [5 cmH20] 5 cmH20 Pressure Support:  [5 cmH20] 5 cmH20 Plateau Pressure:  [12 cmH20-16 cmH20] 16 cmH20   Intake/Output Summary (Last 24 hours) at 01/14/2022 3536 Last data filed at 01/14/2022 0700 Gross per 24 hour  Intake 4162.27 ml  Output 1183 ml  Net 2979.27 ml    Filed Weights   01/13/22 0500 01/13/22 1236 01/14/22 0500  Weight: 89.8 kg 89.8 kg 90.4 kg   CVP:  [11 mmHg-15 mmHg] 11 mmHg    Examination: General: critically ill appearing woman lying in bed in NAD HEENT: [place]/AT, eyes anicteric, ETT, OGT in place Cardio: S1S2, RRR Resp: mild thick tan secretions, CTAB, breathing comfortably Abd: soft, ND, hypoactive bowel sounds. moderate gastric secretions from NGT. Extremities: no cyanosis or clubbing. Minimal edema. Derm: pallor, no diffuse rashes Neuro: RASS 0, weak but following commands  8/17 blood cultures: NGTD Urine culture: polymicrobial Trach aspirate> NGTD  Na+ 138 K3.7 Bicarb 20 BUN 31 Cr 2.00 WBC 14.7 H/H 10.2/31.9 Platelets 145  BG 100-170s  I 4L O 60cc urine, 350 cc NG  413 CRRT N +12L  Resolved: Acute hypoxic respiratory failure  Assessment & Plan:  Sepsis s/p lap for SBO Off of pressors since early this morning. Surgery following. NPO with NG to intermittent suction. TNA started 8/21 -con't broadened antibiotics>aztreonam, metronidazole -follow cultures until finalized -general surgery consulted at [facility] for continuation of post-op care  Afib with RVR -con't  electrolyte monitoring -echo pending  AKI - likely ATN from sepsis and prolonged pre-renal state. ARB prior to admit UOP improved. CRRT discontinued 8/21. -additional volume resuscitation given at admission; can be cautious with maintenance IVF -appreciate Nephrology's management, CRRT -dcon't foley -con't holding ARB  Hyponatremia, likely hypovolemic- resolved -monitor  Hypoglycemia -on D5w infusion  Mild non-AG Met acidosis likely to AKI  -CRRT  Moderate protein energy malnutrition -start TPN  Chronic anemia -transfuse for Hb <7 or hemodynamically significant bleeding  Best Practice (right click and "Reselect all SmartList Selections" daily)  GI: PPI DVT: heparin Nutrition: TPN Lines & tubes: CVC, HD Family discussions: pending  Christiana Fuchs, DO 01/14/22 7:22 AM [facility], PGY-2

## 2022-01-14 NOTE — Progress Notes (Signed)
Patient ID: [name], female   DOB: 07-27-1945, 76 y.o.   MRN: [id number]   Acute Care Surgery Service Progress Note:    Chief Complaint/Subjective: Unable to obtain - pt intubated On PS this am -weaning trial Nurse reports 2 bms, ng 700 bilious  Objective: Vital signs in last 24 hours: Temp:  [96.6 F (35.9 C)-99.1 F (37.3 C)] 98.2 F (36.8 C) (08/21 0915) Pulse Rate:  [77-126] 108 (08/21 0915) Resp:  [14-26] 17 (08/21 0915) BP: (96-156)/(60-87) 130/80 (08/21 0800) SpO2:  [82 %-100 %] 93 % (08/21 0915) Arterial Line BP: (84-181)/(38-81) 134/63 (08/21 0915) FiO2 (%):  [30 %] 30 % (08/21 0823) Weight:  [89.8 kg-90.4 kg] 90.4 kg (08/21 0500) Last BM Date : 01/13/22  Intake/Output from previous day: 08/20 0701 - 08/21 0700 In: 4162.3 [I.V.:3762.3; IV Piggyback:399.9] Out: 1183 [Urine:70; Emesis/NG output:700] Intake/Output this shift: Total I/O In: 425.4 [I.V.:325.4; IV Piggyback:100] Out: 0   Lungs: cta, nonlabored, intubated, on PS  Cardiovascular: reg  Abd: soft, min distension, dressing c/d/I; approp TTP  Extremities: no edema, +SCDs  Neuro: alert, nonfocal, follows commands  Lab Results: CBC  Recent Labs    01/13/22 0448 01/14/22 0400  WBC 14.7* 13.0*  HGB 10.2* 9.4*  HCT 31.9* 28.9*  PLT 144* 145*   BMET Recent Labs    01/13/22 1521 01/14/22 0400  NA 137 138  K 3.9 3.7  CL 108 109  CO2 20* 20*  GLUCOSE 157* 158*  BUN 43* 31*  CREATININE 2.56* 2.08*  CALCIUM 7.5* 7.4*   LFT    Latest Ref Rng & Units 01/14/2022    4:00 AM 01/13/2022    3:21 PM 01/13/2022    4:48 AM  Hepatic Function  Total Protein 6.5 - 8.1 g/dL 4.5     Albumin 3.5 - 5.0 g/dL 1.9  1.9  1.9   AST 15 - 41 U/L 20     ALT 0 - 44 U/L 16     Alk Phosphatase 38 - 126 U/L 84     Total Bilirubin 0.3 - 1.2 mg/dL 0.7      PT/INR No results for input(s): "LABPROT", "INR" in the last 72 hours. ABG No results for input(s): "PHART", "HCO3" in the last 72 hours.  Invalid  input(s): "PCO2", "PO2"  Studies/Results:  Anti-infectives: Anti-infectives (From admission, onward)    Start     Dose/Rate Route Frequency Ordered Stop   01/13/22 2100  vancomycin (VANCOCIN) IVPB 1000 mg/200 mL premix  Status:  Discontinued        1,000 mg 200 mL/hr over 60 Minutes Intravenous Every 24 hours 01/12/22 2135 01/13/22 1027   01/13/22 0500  aztreonam (AZACTAM) 2 g in sodium chloride 0.9 % 100 mL IVPB       See Hyperspace for full Linked Orders Report.   2 g 200 mL/hr over 30 Minutes Intravenous Every 12 hours 01/12/22 2135     01/12/22 2015  vancomycin (VANCOREADY) IVPB 1750 mg/350 mL        1,750 mg 175 mL/hr over 120 Minutes Intravenous  Once 01/12/22 1921 01/12/22 2259   01/11/22 1800  aztreonam (AZACTAM) 1 g in sodium chloride 0.9 % 100 mL IVPB  Status:  Discontinued       See Hyperspace for full Linked Orders Report.   1 g 200 mL/hr over 30 Minutes Intravenous Every 8 hours 01/11/22 0815 01/12/22 2135   01/11/22 1000  levofloxacin (LEVAQUIN) IVPB 500 mg  Status:  Discontinued  500 mg 100 mL/hr over 60 Minutes Intravenous Every 48 hours 01/10/22 1432 01/11/22 0744   01/11/22 0915  aztreonam (AZACTAM) 2 g in sodium chloride 0.9 % 100 mL IVPB       See Hyperspace for full Linked Orders Report.   2 g 200 mL/hr over 30 Minutes Intravenous  Once 01/11/22 0815 01/11/22 1045   01/11/22 0845  aztreonam (AZACTAM) 2 g in sodium chloride 0.9 % 100 mL IVPB  Status:  Discontinued        2 g 200 mL/hr over 30 Minutes Intravenous  Once 01/11/22 0747 01/11/22 0815   01/11/22 0845  metroNIDAZOLE (FLAGYL) IVPB 500 mg        500 mg 100 mL/hr over 60 Minutes Intravenous Every 12 hours 01/11/22 0747 01/18/22 0844   01/10/22 1900  levofloxacin (LEVAQUIN) IVPB 500 mg  Status:  Discontinued        500 mg 100 mL/hr over 60 Minutes Intravenous Every 24 hours 01/10/22 1426 01/10/22 1432   01/10/22 0700  ciprofloxacin (CIPRO) IVPB 400 mg        400 mg 200 mL/hr over 60 Minutes  Intravenous On call to O.R. 01/10/22 0612 01/10/22 1052   01/10/22 0700  metroNIDAZOLE (FLAGYL) IVPB 500 mg  Status:  Discontinued        500 mg 100 mL/hr over 60 Minutes Intravenous On call to O.R. 01/10/22 0612 01/10/22 1123   01/08/22 0830  Levofloxacin (LEVAQUIN) IVPB 250 mg  Status:  Discontinued        250 mg 50 mL/hr over 60 Minutes Intravenous Every 24 hours 01/08/22 0724 01/08/22 0727   01/08/22 0830  Levofloxacin (LEVAQUIN) IVPB 250 mg  Status:  Discontinued        250 mg 50 mL/hr over 60 Minutes Intravenous Every 48 hours 01/08/22 0727 01/08/22 0732   01/08/22 0830  Levofloxacin (LEVAQUIN) IVPB 250 mg  Status:  Discontinued        250 mg 50 mL/hr over 60 Minutes Intravenous Every 24 hours 01/08/22 0732 01/10/22 1426       Medications: Scheduled Meds:  Chlorhexidine Gluconate Cloth  6 each Topical Daily   insulin aspart  0-15 Units Subcutaneous Q4H   ipratropium-albuterol  3 mL Nebulization TID   mouth rinse  15 mL Mouth Rinse Q2H   pantoprazole (PROTONIX) IV  40 mg Intravenous Daily   sodium chloride flush  10-40 mL Intracatheter Q12H   sodium chloride flush  3 mL Intravenous Q12H   Continuous Infusions:   prismasol BGK 4/2.5 400 mL/hr at 01/13/22 2221    prismasol BGK 4/2.5 400 mL/hr at 01/13/22 2221   sodium chloride Stopped (01/13/22 0839)   sodium chloride 10 mL/hr at 01/14/22 0900   sodium chloride     amiodarone 30 mg/hr (01/14/22 0900)   aztreonam Stopped (01/14/22 0556)   dextrose 5 % and 0.9% NaCl 125 mL/hr at 01/14/22 0900   heparin 1,100 Units/hr (01/14/22 0900)   metronidazole Stopped (01/14/22 0858)   norepinephrine (LEVOPHED) Adult infusion Stopped (01/14/22 0317)   prismasol BGK 4/2.5 1,500 mL/hr at 01/14/22 0648   PRN Meds:.sodium chloride, Place/Maintain arterial line **AND** sodium chloride, albuterol, alteplase, fentaNYL (SUBLIMAZE) injection, fentaNYL (SUBLIMAZE) injection, heparin, [DISCONTINUED] ondansetron **OR** ondansetron (ZOFRAN) IV,  mouth rinse, sodium chloride, sodium chloride flush, sodium chloride flush  Assessment/Plan: Patient Active Problem List   Diagnosis Date Noted   Respiratory failure requiring intubation ([place]) 01/11/2022    Class: Acute   Acute respiratory failure with hypoxia and hypercapnia (  [place]) 01/10/2022   Septic shock ([place]) 01/10/2022   Pressure injury of skin 01/10/2022   AKI (acute kidney injury) ([place]) 01/09/2022   Small bowel obstruction ([place]) 01/07/2022   Abnormal weight loss 11/28/2021   Family history of malignant neoplasm of digestive organs 11/28/2021   Intestinal malabsorption 11/28/2021   Renal insufficiency 12/08/2020   Degeneration of lumbar intervertebral disc 01/07/2020   Venous (peripheral) insufficiency 09/30/2019   Anemia 08/12/2017   Vitamin D deficiency 08/12/2017   Lumbar spondylosis with myelopathy 06/03/2017   Primary osteoarthritis of both knees 03/19/2017   Anxiety 02/17/2017   History of pulmonary embolus (PE) 07/09/2016   COPD (chronic obstructive pulmonary disease) ([place]) 01/18/2016   Hyperlipidemia 01/18/2016   Depression 01/18/2016   Essential hypertension 01/18/2016   H/O spinal fusion 01/18/2016   Hypothyroidism (acquired) 01/18/2016   Thoracic aorta atherosclerosis ([place]) 01/18/2016   Chronic cholecystitis with calculus 10/04/2013   Data reviewed - vitals x 24 hrs, nursing note x 24hrs, CCM note, renal note  POD#4 - status post ex lap with lysis of adhesions - Dr. Constance Haw ([facility])             NPO, NG decompression             IVF - TNA ordered to begin today  by CCM             Await resolution of ileus             Will follow with you Afib - IV hep  VTE proph - iv heparin ABL anemia - hgb stable at 9  Disposition:  LOS: 7 days    Leighton Ruff. Redmond Pulling, MD, FACS General, Bariatric, & Minimally Invasive Surgery [phone] [facility]

## 2022-01-14 NOTE — Progress Notes (Signed)
Patient tachypneic with SPO2 of 89%. Arnoldo Hooker, RRT to bedside, patient returns to full ventilator support. Dr. Lynetta Mare and Dr. Howie Ill notified, approved administration of prn Fentanyl at this time.

## 2022-01-15 DIAGNOSIS — R600 Localized edema: Secondary | ICD-10-CM | POA: Diagnosis not present

## 2022-01-15 DIAGNOSIS — A419 Sepsis, unspecified organism: Secondary | ICD-10-CM | POA: Diagnosis not present

## 2022-01-15 DIAGNOSIS — I4891 Unspecified atrial fibrillation: Secondary | ICD-10-CM | POA: Diagnosis not present

## 2022-01-15 DIAGNOSIS — R6521 Severe sepsis with septic shock: Secondary | ICD-10-CM | POA: Diagnosis not present

## 2022-01-15 DIAGNOSIS — J9601 Acute respiratory failure with hypoxia: Secondary | ICD-10-CM | POA: Diagnosis not present

## 2022-01-15 LAB — COMPREHENSIVE METABOLIC PANEL
ALT: 20 U/L (ref 0–44)
AST: 34 U/L (ref 15–41)
Albumin: 1.8 g/dL — ABNORMAL LOW (ref 3.5–5.0)
Alkaline Phosphatase: 155 U/L — ABNORMAL HIGH (ref 38–126)
Anion gap: 7 (ref 5–15)
BUN: 28 mg/dL — ABNORMAL HIGH (ref 8–23)
CO2: 24 mmol/L (ref 22–32)
Calcium: 7.4 mg/dL — ABNORMAL LOW (ref 8.9–10.3)
Chloride: 107 mmol/L (ref 98–111)
Creatinine, Ser: 2.09 mg/dL — ABNORMAL HIGH (ref 0.44–1.00)
GFR, Estimated: 24 mL/min — ABNORMAL LOW (ref >60)
Glucose, Bld: 98 mg/dL (ref 70–99)
Potassium: 3.3 mmol/L — ABNORMAL LOW (ref 3.5–5.1)
Sodium: 138 mmol/L (ref 135–145)
Total Bilirubin: 0.7 mg/dL (ref 0.3–1.2)
Total Protein: 4.1 g/dL — ABNORMAL LOW (ref 6.5–8.1)

## 2022-01-15 LAB — CULTURE, BLOOD (ROUTINE X 2)
Culture: NO GROWTH
Culture: NO GROWTH
Special Requests: ADEQUATE

## 2022-01-15 LAB — CBC
HCT: 26.8 % — ABNORMAL LOW (ref 36.0–46.0)
Hemoglobin: 8.4 g/dL — ABNORMAL LOW (ref 12.0–15.0)
MCH: 27.9 pg (ref 26.0–34.0)
MCHC: 31.3 g/dL (ref 30.0–36.0)
MCV: 89 fL (ref 80.0–100.0)
Platelets: 185 10*3/uL (ref 150–400)
RBC: 3.01 MIL/uL — ABNORMAL LOW (ref 3.87–5.11)
RDW: 18.6 % — ABNORMAL HIGH (ref 11.5–15.5)
WBC: 18.8 10*3/uL — ABNORMAL HIGH (ref 4.0–10.5)
nRBC: 0 % (ref 0.0–0.2)

## 2022-01-15 LAB — GLUCOSE, CAPILLARY
Glucose-Capillary: 78 mg/dL (ref 70–99)
Glucose-Capillary: 88 mg/dL (ref 70–99)
Glucose-Capillary: 92 mg/dL (ref 70–99)
Glucose-Capillary: 102 mg/dL — ABNORMAL HIGH (ref 70–99)
Glucose-Capillary: 101 mg/dL — ABNORMAL HIGH (ref 70–99)

## 2022-01-15 LAB — HEPATITIS B SURFACE ANTIGEN: Hepatitis B Surface Ag: NONREACTIVE

## 2022-01-15 LAB — PHOSPHORUS: Phosphorus: 2 mg/dL — ABNORMAL LOW (ref 2.5–4.6)

## 2022-01-15 LAB — HEPATITIS B SURFACE ANTIBODY,QUALITATIVE: Hep B S Ab: NONREACTIVE

## 2022-01-15 LAB — HEPARIN LEVEL (UNFRACTIONATED)
Heparin Unfractionated: 0.21 IU/mL — ABNORMAL LOW (ref 0.30–0.70)
Heparin Unfractionated: <0.1 IU/mL — ABNORMAL LOW (ref 0.30–0.70)

## 2022-01-15 LAB — HEPATITIS B CORE ANTIBODY, TOTAL: Hep B Core Total Ab: NONREACTIVE

## 2022-01-15 LAB — HEPATITIS C ANTIBODY: HCV Ab: NONREACTIVE

## 2022-01-15 LAB — MAGNESIUM: Magnesium: 2.1 mg/dL (ref 1.7–2.4)

## 2022-01-15 MED ORDER — QUETIAPINE FUMARATE 25 MG PO TABS
25.0000 mg | ORAL_TABLET | Freq: Every day | ORAL | Status: DC
Start: 2022-01-15 — End: 2022-01-16
  Administered 2022-01-15: 25 mg via ORAL
  Filled 2022-01-15: qty 1

## 2022-01-15 MED ORDER — POTASSIUM CHLORIDE 10 MEQ/100ML IV SOLN
10.0000 meq | INTRAVENOUS | Status: AC
  Administered 2022-01-15 (×4): 10 meq via INTRAVENOUS
  Filled 2022-01-15 (×4): qty 100

## 2022-01-15 MED ORDER — HYDROMORPHONE HCL 1 MG/ML IJ SOLN
0.2500 mg | Freq: Four times a day (QID) | INTRAMUSCULAR | Status: DC | PRN
  Administered 2022-01-15: 0.25 mg via INTRAVENOUS
  Administered 2022-01-15: 0.5 mg via INTRAVENOUS
  Filled 2022-01-15 (×2): qty 0.5

## 2022-01-15 MED ORDER — AMIODARONE HCL 200 MG PO TABS
200.0000 mg | ORAL_TABLET | Freq: Three times a day (TID) | ORAL | Status: DC
Start: 2022-01-15 — End: 2022-01-15
  Administered 2022-01-15: 200 mg via ORAL
  Filled 2022-01-15: qty 1

## 2022-01-15 MED ORDER — TRAVASOL 10 % IV SOLN
INTRAVENOUS | Status: AC
  Filled 2022-01-15: qty 1134

## 2022-01-15 MED ORDER — AMIODARONE HCL 200 MG PO TABS
200.0000 mg | ORAL_TABLET | Freq: Every day | ORAL | Status: DC
Start: 2022-01-21 — End: 2022-01-15

## 2022-01-15 MED ORDER — BOOST / RESOURCE BREEZE PO LIQD CUSTOM
1.0000 | Freq: Three times a day (TID) | ORAL | Status: DC
  Administered 2022-01-15 – 2022-01-21 (×7): 1 via ORAL

## 2022-01-15 MED ORDER — AMIODARONE HCL 200 MG PO TABS
400.0000 mg | ORAL_TABLET | Freq: Two times a day (BID) | ORAL | Status: DC
  Administered 2022-01-15: 400 mg via ORAL
  Filled 2022-01-15: qty 2

## 2022-01-15 NOTE — Progress Notes (Signed)
[name], Son, updated by phone [phone].   Cindy Busk M. Aviva Wolfer, D.O.  Internal Medicine Resident, PGY-2 [facility]  1:57 PM, 01/15/2022

## 2022-01-15 NOTE — Evaluation (Signed)
Physical Therapy Evaluation Patient Details Name: [name] MRN: [id number] DOB: Nov 20, 1945 Today's Date: 01/15/2022  History of Present Illness  76 yo female admitted 8/14 to [facility] with vomiting and diarrhea with partial SBO. 8/17 intubated and ex lap. 8/19 pt with worsening renal function, transfer to [facility] and CRRT started. 8/21 extubation with CRRT off. PMHx: COPD, HLD, HTN, PE, hypothyroidism, OA and depression  Clinical Impression  Pt pleasant, HOH with chronic back pain and noted confusion this session. Sister present to assist with history and home setup stating pt is very limited in baseline abilities, house is a mess and pt's daughter with psych issues who pt cares for.  Per family pt has a very cluttered house, unable to clean, daughter never leaves her room so has not bathed for years and pt cares for daughter and cooks meals for mother who lives in another house. Pt with very limited walking with RW due to back issues. Pt presents with decreased cognition, strength, mobility and function who will benefit from acute therapy to maximize mobility and safety to decrease burden of care.   Pt on 4L throughout with SPO2 94%     Recommendations for follow up therapy are one component of a multi-disciplinary discharge planning process, led by the attending physician.  Recommendations may be updated based on patient status, additional functional criteria and insurance authorization.  Follow Up Recommendations Skilled nursing-short term rehab (<3 hours/day) Can patient physically be transported by private vehicle: No    Assistance Recommended at Discharge Frequent or constant Supervision/Assistance  Patient can return home with the following  Two people to help with walking and/or transfers;Direct supervision/assist for medications management;Two people to help with bathing/dressing/bathroom;Assist for transportation;Direct supervision/assist for financial management    Equipment  Recommendations Other (comment) (TBD)  Recommendations for Other Services       Functional Status Assessment Patient has had a recent decline in their functional status and/or demonstrates limited ability to make significant improvements in function in a reasonable and predictable amount of time     Precautions / Restrictions Precautions Precautions: Fall Precaution Comments: bowel incontinence      Mobility  Bed Mobility Overal bed mobility: Needs Assistance Bed Mobility: Supine to Sit, Sit to Supine, Rolling Rolling: Max assist, +2 for physical assistance   Supine to sit: Max assist Sit to supine: Max assist   General bed mobility comments: max assist to pivot to EOB and back with use of pad, max cues increased time and use of rail. Max +2 assist to roll with pt resistant with fear of falling    Transfers Overall transfer level: Needs assistance   Transfers: Sit to/from Stand Sit to Stand: Mod assist           General transfer comment: mod assist with bil knees blocked and pad to stand from bed. Pt with short stature so created elevated height in lowest bed position. Pt able to clear sacrum but flexed trunk with pt head leaning into therapist chest for stabiliy and unable to extend trunk, stood grossly 15 sec before return to sitting and supine. Pt unable to shift weight in standing    Ambulation/Gait                  Stairs            Wheelchair Mobility    Modified Rankin (Stroke Patients Only)       Balance Overall balance assessment: Needs assistance Sitting-balance support: Feet supported, Feet unsupported Sitting balance-Leahy  Scale: Poor Sitting balance - Comments: min assist for sitting balance with initial posterior lean   Standing balance support: Bilateral upper extremity supported Standing balance-Leahy Scale: Poor Standing balance comment: mod assist for standing balance, bil knees blocked and bil UE supported                              Pertinent Vitals/Pain Pain Assessment Pain Score: 4  Pain Location: back with mobility Pain Descriptors / Indicators: Grimacing Pain Intervention(s): Limited activity within patient's tolerance, Monitored during session, Repositioned    Home Living Family/patient expects to be discharged to:: Private residence Living Arrangements: Children   Type of Home: House Home Access: Stairs to enter   Technical brewer of Steps: 2   Home Layout: Two level;Able to live on main level with bedroom/bathroom Home Equipment: Rolling Walker (2 wheels)      Prior Function Prior Level of Function : Independent/Modified Independent             Mobility Comments: pt walks with kyphotic posture and walker limited distance ADLs Comments: pt reports bathing, dressing and cooking. Cares for daughter at home with psych/anxiety issues who refuses to leave her room (wipes off with wet wipes and uses toilet in trash can). Pt cooks for her mother and goes to her house daily but per sister pt has a very cluttered/hoarding house without room to move or clean     Hand Dominance        Extremity/Trunk Assessment   Upper Extremity Assessment Upper Extremity Assessment: Generalized weakness    Lower Extremity Assessment Lower Extremity Assessment: Generalized weakness;RLE deficits/detail;LLE deficits/detail RLE Deficits / Details: bil LE edema with limited ROM, knee flexion grossly 30 degrees with AAROM, limited hip flexion grossly 45 LLE Deficits / Details: bil LE edema with limited ROM, knee flexion grossly 30 degrees with AAROM, limited hip flexion grossly 45    Cervical / Trunk Assessment Cervical / Trunk Assessment: Kyphotic  Communication   Communication: HOH  Cognition Arousal/Alertness: Awake/alert Behavior During Therapy: Anxious Overall Cognitive Status: Impaired/Different from baseline Area of Impairment: Orientation, Memory, Following commands,  Safety/judgement                 Orientation Level: Disoriented to, Time   Memory: Decreased short-term memory Following Commands: Follows one step commands inconsistently, Follows one step commands with increased time Safety/Judgement: Decreased awareness of safety, Decreased awareness of deficits     General Comments: pt fearful of falling even with rolling in bed, no awareness of deficits and heavy need for assist        General Comments      Exercises     Assessment/Plan    PT Assessment Patient needs continued PT services  PT Problem List Decreased strength;Decreased mobility;Decreased range of motion;Decreased activity tolerance;Decreased cognition;Decreased balance;Cardiopulmonary status limiting activity;Decreased knowledge of use of DME;Pain;Decreased coordination;Obesity;Decreased safety awareness       PT Treatment Interventions Gait training;Balance training;DME instruction;Therapeutic exercise;Functional mobility training;Cognitive remediation;Therapeutic activities;Patient/family education;Neuromuscular re-education    PT Goals (Current goals can be found in the Care Plan section)  Acute Rehab PT Goals Patient Stated Goal: be able to go home PT Goal Formulation: With patient Time For Goal Achievement: 01/29/22 Potential to Achieve Goals: Fair    Frequency Min 3X/week     Co-evaluation               AM-PAC PT "6 Clicks" Mobility  Outcome Measure Help needed  turning from your back to your side while in a flat bed without using bedrails?: A Lot Help needed moving from lying on your back to sitting on the side of a flat bed without using bedrails?: A Lot Help needed moving to and from a bed to a chair (including a wheelchair)?: Total Help needed standing up from a chair using your arms (e.g., wheelchair or bedside chair)?: Total Help needed to walk in hospital room?: Total Help needed climbing 3-5 steps with a railing? : Total 6 Click Score:  8    End of Session Equipment Utilized During Treatment: Oxygen Activity Tolerance: Patient tolerated treatment well Patient left: in bed;with call bell/phone within reach;with bed alarm set;with family/visitor present Nurse Communication: Mobility status;Need for lift equipment PT Visit Diagnosis: Other abnormalities of gait and mobility (R26.89);Difficulty in walking, not elsewhere classified (R26.2);Muscle weakness (generalized) (M62.81)    Time: 9767-3419 PT Time Calculation (min) (ACUTE ONLY): 41 min   Charges:   PT Evaluation $PT Eval Moderate Complexity: 1 Mod PT Treatments $Therapeutic Activity: 23-37 mins        Bayard Males, PT Acute Rehabilitation Services Office: [phone]   Sandy Salaam Chevis Weisensel 01/15/2022, 1:36 PM

## 2022-01-15 NOTE — Progress Notes (Signed)
Patient ID: [name], female   DOB: Jan 23, 1946, 76 y.o.   MRN: [id number] [facility] Progress Note   Subjective:    Extubated yesterdya CRRT stopped SCR 2.1, K 3.3 this AM Anuric UOP On TPN    Objective:   BP 125/70 (BP Location: Right Arm)   Pulse 86   Temp 99.4 F (37.4 C) (Oral)   Resp 16   Ht '5\' 4"'$  (1.626 m)   Wt 94.4 kg   SpO2 99%   BMI 35.72 kg/m    Physical Exam: Gen: alert, awake, resting in bed.  NGT in place CVS: Pulse regular rhythm, normal rate, S1 and S2 normal Resp: anteriorly clear to auscultation, no rales/rhonchi to auscultation Abd: Soft, honeycomb dressing in place, no drainage noted Ext: Trace bilateral lower extremity edema with lichenification of skin noted, poor skin turgo L IJ Temp Cath C/D/I   Assessment/ Plan:   1. Dialysis dependent Acute kidney Injury - baseline renal function essentially normal with a creatinine of 1.09 and now with what appears to be anuric ischemic ATN from high-grade small bowel obstruction, likely third spacing and sustained renal hypoperfusion.  She was on ARB prior to admission.   Off pressors CRRT stop 01/14/22 Will still req iHD, will follow, tentative next tx 8/23: 3K 3.5h, 1-2L UF; probably separate in ICU  2.  Septic shock - high-grade small bowel obstruction s/p exploratory laparotomy with adhesiolysis.  Off pressors  3.  High-grade small bowel obstruction: Status post exploratory laparotomy by Dr. Constance Haw with lysis of additions.  CCS following. AROBF  4.  Anion gap metabolic acidosis: Resolved.     Recent Labs  Lab 01/14/22 0400 01/14/22 1615 01/15/22 0431  HGB 9.4*  --  8.4*  ALBUMIN 1.9* 1.8* 1.8*  CALCIUM 7.4* 7.3* 7.4*  PHOS 1.9* 1.6* 2.0*  CREATININE 2.08* 1.64* 2.09*  K 3.7 3.6 3.3*     Medications:     Chlorhexidine Gluconate Cloth  6 each Topical Daily   insulin aspart  0-15 Units Subcutaneous Q4H   sodium chloride flush  10-40 mL Intracatheter Q12H   sodium chloride  flush  3 mL Intravenous Q12H   Rexene Agent, MD  [facility]

## 2022-01-15 NOTE — Progress Notes (Signed)
[place] for Heparin Indication: atrial fibrillation  Allergies  Allergen Reactions   Celebrex [Celecoxib] Swelling   Keflet [Cephalexin] Swelling   Penicillins Hives and Swelling    DID THE REACTION INVOLVE: Swelling of the face/tongue/throat, SOB, or low BP? Yes-swelling-hives Sudden or severe rash/hives, skin peeling, or the inside of the mouth or nose? Unknown Did it require medical treatment? Unknown When did it last happen?    over 10 years   If all above answers are "NO", may proceed with cephalosporin use.    Sulfa Antibiotics Swelling   Kenalog [Triamcinolone Acetonide]     unknown   Latex    Lisinopril Other (See Comments)    unknown   Mobic [Meloxicam]     SWELLING    Patient Measurements: Height: '5\' 4"'$  (162.6 cm) Weight: 90.4 kg (199 lb 4.7 oz) IBW/kg (Calculated) : 54.7 Heparin Dosing Weight: 71.5 kg  Vital Signs: Temp: 99.4 F (37.4 C) (08/22 0354) Temp Source: Oral (08/22 0354) BP: 108/57 (08/22 0400) Pulse Rate: 86 (08/22 0400)  Labs: Recent Labs    01/13/22 0448 01/13/22 1521 01/14/22 0400 01/14/22 1248 01/14/22 1615 01/15/22 0431  HGB 10.2*  --  9.4*  --   --  8.4*  HCT 31.9*  --  28.9*  --   --  26.8*  PLT 144*  --  145*  --   --  185  APTT 50*  --  197*  --   --   --   HEPARINUNFRC  --    < > 0.49 0.35  --  0.21*  CREATININE 3.55*   < > 2.08*  --  1.64* 2.09*   < > = values in this interval not displayed.     Estimated Creatinine Clearance: 25.3 mL/min (A) (by C-G formula based on SCr of 2.09 mg/dL (H)).   Assessment: Pharmacy consulted to start heparin infusion for post-op afib. Patient was not on any AC PTA.    Systemic heparin at 1100 units/hr. Heparin level down to subtherapeutic (0.21). Remains off CRRT fixed dose heparin. No issues with line or bleeding reported per RN. Hgb trend down some overnight to 8.4.  Goal of Therapy:  Heparin level 0.3-0.7 units/ml Monitor platelets by  anticoagulation protocol: Yes   Plan:  Increase systemic heparin at 1250 units/hr F/u 8hr heparin level  Sherlon Handing, PharmD, BCPS Please see amion for complete clinical pharmacist phone list 01/15/2022, 5:13 AM

## 2022-01-15 NOTE — Consult Note (Addendum)
Cardiology Consultation:   Patient ID: [name] MRN: [id number]; DOB: 06-03-1945  Admit date: 01/07/2022 Date of Consult: 01/15/2022  PCP:  Chevis Pretty, [place] [facility] Cardiologist:  Carlyle Dolly, MD    Patient Profile:   [name] is a 76 y.o. female with a hx of chronic diastolic heart failure, COPD, HLD, HTN, hypothyroidism, history of PE, osteoarthritis, and depression who is being seen 01/15/2022 for the evaluation of atrial fibrillation at the request of Dr. Lynetta Mare.  History of Present Illness:   [name] is a76 year old female with above medical history who is followed by Dr. Harl Bowie. Per chart review, patient was referred to Dr. [facility]isc[facility] in 08/2017 for evaluation of leg edema that had been ongoing for a few years. Echocardiogram on 10/22/2021 showed EF 60-65%, no regional wall motion abnormalities, mild LVH, severely dilated LA. She was started on lasix, encouraged to decrease her sodium intake. Since then, patient has been followed by cardiology for lasix dose adjustments.   Patient was last seen by cardiology on 12/05/2021. At that time, patient had reported some low BP readings at home and had a decline in renal function, so her lasix dose was decreased to 20 mg MWF.   Patient presented to the [facility] ED on 8/14 complaining of abdominal pain. Son reported that the patient had eaten some fruit that she kept outside on the porch and became sick with nausea and vomiting. In the ED, a CT scan showed that she had a partial small bowel obstruction. Patient was initially started on IV fluids and an NG tube was placed, surgery saw patient and wanted to pursue medical, conservative management. However, BP was consistently low and she developed worsening AKI and leukocytosis. A repeat CT scan was concerning for SBO high grade with some developing ischemia. Patient went into shock on 8/17 and she was taken emergently to the OR for exploratory  laparotomy and lysis of adhesion.   After her surgery on 8/17, patient was taken to the ICU for treatment of septic shock s/p high-grade small bowel obstruction s/p exploratory laparotomy with adhesiolysis. She remained on vent/pressors post-op. While in the ICU, her AKI did not improve and she was oliguric. She was seen by nephrology who thought her AKI was due to ischemic ATN, sepsis, third spacing and sustained renal hypoperfusion (pre-renal). She was given IV fluids, and was transferred to [facility] in case she would need CRRT. She arrived at [facility] on 8/19.   At [facility], patient started CRRT on 8/19.  She was diagnosed with aspiration PNA and continued to be on IV antibiotics. On 8/20, patient was noted to develop new onset atrial fibrillation with RVR. She was started on IV amidarone, IV heparin. She was weaned off of pressors and extubated on 8/21. Her NG tube has been removed and she is stating to tolerate oral intake.   Echocardiogram from 01/14/2022 showed EF 60-65%, no regional wall motion abnormalities, normal LV diastolic parameters, normal RV systolic function, normal pulmonary artery systolic pressure, mildly dilated LA and RA.   On interview, patient is somewhat confused but is able to answer simple questions.  She denies having any chest pain or shortness of breath.  Denies any palpitations or fluttering in her chest.  Denies dizziness, syncope/near syncope.  Denies any known history of atrial fibrillation.  Does have a history of lower extremity edema, takes Lasix at home and wears compression socks.  Also has a pump at home.  Past Medical History:  Diagnosis Date   Allergy    Bronchitis, chronic (HCC)    Chronic anxiety    Complication of anesthesia    hard to wake up   COPD (chronic obstructive pulmonary disease) (HCC)    Depression    Esophageal reflux    Headache(784.0)    Hyperlipidemia    Hypertension    Hypothyroidism    Obesity    Osteoporosis    Overactive bladder    PVC's  (premature ventricular contractions)    Thyroid disease    Vitamin D deficiency disease     Past Surgical History:  Procedure Laterality Date   ABDOMINAL HYSTERECTOMY     CENTRAL LINE INSERTION Right 01/10/2022   Procedure: CENTRAL LINE INSERTION;  Surgeon: Virl Cagey, MD;  Location: [facility];  Service: General;  Laterality: Right;   CHOLECYSTECTOMY N/A 11/04/2013   Procedure: LAPAROSCOPIC CHOLECYSTECTOMY WITH INTRAOPERATIVE CHOLANGIOGRAM;  Surgeon: Imogene Burn. Georgette Dover, MD;  Location: [place];  Service: General;  Laterality: N/A;   COLONOSCOPY     FRACTURE SURGERY     pins removed from Hip surgery   HIP FRACTURE SURGERY  1990    pins removed in [place] 01/10/2022   Procedure: EXPLORATORY LAPAROTOMY,;  Surgeon: Virl Cagey, MD;  Location: [facility];  Service: General;  Laterality: N/A;   LYSIS OF ADHESION N/A 01/10/2022   Procedure: LYSIS OF ADHESION;  Surgeon: Virl Cagey, MD;  Location: [facility];  Service: General;  Laterality: N/A;   PARTIAL HYSTERECTOMY  1987   SPINAL FUSION     UPPER GASTROINTESTINAL ENDOSCOPY       Home Medications:  Prior to Admission medications   Medication Sig Start Date End Date Taking? Authorizing Provider  acetaminophen (TYLENOL) 650 MG CR tablet Take 650 mg by mouth every 8 (eight) hours as needed for pain.   Yes [provider]  ALPRAZolam (XANAX) 0.5 MG tablet Take 1 tablet (0.5 mg total) by mouth 2 (two) times daily as needed for anxiety (use SPARINGLY). 10/30/21  Yes Martin, Mary-Margaret, FNP  calcium carbonate (OS-CAL - DOSED IN MG OF ELEMENTAL CALCIUM) 1250 (500 Ca) MG tablet Take 1 tablet by mouth once a week.    Yes [provider]  Cholecalciferol (VITAMIN D3) 10 MCG (400 UNIT) CAPS Take 1 capsule by mouth daily.   Yes [provider]  escitalopram (LEXAPRO) 10 MG tablet Take 1 tablet (10 mg total) by mouth daily. 10/30/21  Yes Hassell Done, Mary-Margaret, FNP  furosemide (LASIX) 20 MG tablet Take 1 tablet  (20 mg total) by mouth every Monday, Wednesday, and Friday. May take additional 20 mg for swelling 12/05/21 05/22/22 Yes Branch, Alphonse Guild, MD  levothyroxine (SYNTHROID) 50 MCG tablet TAKE ONE (1) TABLET EACH DAY Patient taking differently: Take 50 mcg by mouth daily before breakfast. 10/30/21  Yes Hassell Done, Mary-Margaret, FNP  Multiple Vitamin (MULTIVITAMIN) tablet Take 1 tablet by mouth 2 (two) times a week.    Yes [provider]  omeprazole-sodium bicarbonate (ZEGERID) 40-1100 MG capsule Take 1 capsule by mouth daily before breakfast. 10/30/21  Yes Hassell Done, Mary-Margaret, FNP  ondansetron (ZOFRAN-ODT) 4 MG disintegrating tablet Take 1 tablet (4 mg total) by mouth every 8 (eight) hours as needed for nausea or vomiting. 01/04/22  Yes Fredia Sorrow, MD  telmisartan (MICARDIS) 80 MG tablet Take 1 tablet (80 mg total) by mouth daily. 09/28/21  Yes Hassell Done Mary-Margaret, FNP    Inpatient Medications: Scheduled Meds:  amiodarone  200 mg Oral TID  Followed by   Derrill Memo ON 01/21/2022] amiodarone  200 mg Oral Daily   Chlorhexidine Gluconate Cloth  6 each Topical Daily   feeding supplement  1 Container Oral TID BM   insulin aspart  0-15 Units Subcutaneous Q4H   QUEtiapine  25 mg Oral QHS   sodium chloride flush  10-40 mL Intracatheter Q12H   sodium chloride flush  3 mL Intravenous Q12H   Continuous Infusions:  sodium chloride 250 mL (01/15/22 0747)   sodium chloride 10 mL/hr at 01/15/22 1200   sodium chloride     heparin 1,350 Units/hr (01/15/22 1505)   TPN ADULT (ION) 35 mL/hr at 01/15/22 1200   TPN ADULT (ION)     PRN Meds: sodium chloride, Place/Maintain arterial line **AND** sodium chloride, albuterol, alteplase, HYDROmorphone (DILAUDID) injection, lip balm, [DISCONTINUED] ondansetron **OR** ondansetron (ZOFRAN) IV, mouth rinse, sodium chloride, sodium chloride flush, sodium chloride flush  Allergies:    Allergies  Allergen Reactions   Celebrex [Celecoxib] Swelling   Keflet  [Cephalexin] Swelling   Penicillins Hives and Swelling    DID THE REACTION INVOLVE: Swelling of the face/tongue/throat, SOB, or low BP? Yes-swelling-hives Sudden or severe rash/hives, skin peeling, or the inside of the mouth or nose? Unknown Did it require medical treatment? Unknown When did it last happen?    over 10 years   If all above answers are "NO", may proceed with cephalosporin use.    Sulfa Antibiotics Swelling   Kenalog [Triamcinolone Acetonide]     unknown   Latex    Lisinopril Other (See Comments)    unknown   Mobic [Meloxicam]     SWELLING    Social History:   Social History   Socioeconomic History   Marital status: Single    Spouse name: Not on file   Number of children: 2   Years of education: Not on file   Highest education level: Not on file  Occupational History   Not on file  Tobacco Use   Smoking status: Never   Smokeless tobacco: Never  Vaping Use   Vaping Use: Never used  Substance and Sexual Activity   Alcohol use: No   Drug use: No   Sexual activity: Not on file  Other Topics Concern   Not on file  Social History Narrative   Not on file   Social Determinants of Health   Financial Resource Strain: Low Risk  (11/28/2021)   Overall Financial Resource Strain (CARDIA)    Difficulty of Paying Living Expenses: Not hard at all  Food Insecurity: No Food Insecurity (11/28/2021)   Hunger Vital Sign    Worried About Running Out of Food in the Last Year: Never true    [place] in the Last Year: Never true  Transportation Needs: No Transportation Needs (11/28/2021)   PRAPARE - Hydrologist (Medical): No    Lack of Transportation (Non-Medical): No  Physical Activity: Inactive (11/28/2021)   Exercise Vital Sign    Days of Exercise per Week: 0 days    Minutes of Exercise per Session: 0 min  Stress: Stress Concern Present (11/28/2021)   [place]    Feeling  of Stress : To some extent  Social Connections: Socially Integrated (11/28/2021)   Social Connection and Isolation Panel [NHANES]    Frequency of Communication with Friends and Family: More than three times a week    Frequency of Social Gatherings with Friends and Family:  More than three times a week    Attends Religious Services: More than 4 times per year    Active Member of Clubs or Organizations: Yes    Attends Archivist Meetings: More than 4 times per year    Marital Status: Married  Human resources officer Violence: Not At Risk (11/28/2021)   Humiliation, Afraid, Rape, and Kick questionnaire    Fear of Current or Ex-Partner: No    Emotionally Abused: No    Physically Abused: No    Sexually Abused: No    Family History:    Family History  Problem Relation Age of Onset   Arthritis Other    Heart disease Other    Cancer Other    Diabetes Other    OCD Other      ROS:  Please see the history of present illness.   All other ROS reviewed and negative.     Physical Exam/Data:   Vitals:   01/15/22 1100 01/15/22 1147 01/15/22 1200 01/15/22 1323  BP: (!) 100/58  120/75   Pulse: 93  82   Resp: 18  13   Temp:  98.4 F (36.9 C)    TempSrc:  Oral    SpO2: 94%  98% 97%  Weight:      Height:        Intake/Output Summary (Last 24 hours) at 01/15/2022 1515 Last data filed at 01/15/2022 1200 Gross per 24 hour  Intake 2649.58 ml  Output 400 ml  Net 2249.58 ml      01/15/2022    5:00 AM 01/14/2022    5:00 AM 01/13/2022   12:36 PM  Last 3 Weights  Weight (lbs) 208 lb 1.8 oz 199 lb 4.7 oz 197 lb 15.6 oz  Weight (kg) 94.4 kg 90.4 kg 89.8 kg     Body mass index is 35.72 kg/m.  General:  Elderly female, laying in the bed in no acute distress  HEENT: normal Neck: no JVD Vascular: Radial pulses 2+ bilaterally Cardiac: irregular rate and rhythm  Lungs:  normal WOB on [place]. Lungs clear to ausculation with occasional crackles that clear with cough  Abd: soft, tender to palpation   Ext: Mod edema in BLE  Musculoskeletal:  No deformities, BUE and BLE strength normal and equal Skin: warm and dry  Neuro:  CNs 2-12 intact, hard of hearing  Psych:  Normal affect   EKG:  The EKG was personally reviewed and demonstrates:  Atrial fibrillation, HR 105 BPM  Telemetry:  Telemetry was personally reviewed and demonstrates:  Atrial fibrillation, HR in the 90s. Frequent PVCs   Relevant CV Studies:  Echocardiogram 821/2023  1. Left ventricular ejection fraction, by estimation, is 60 to 65%. The  left ventricle has normal function. The left ventricle has no regional  wall motion abnormalities. Left ventricular diastolic parameters were  normal.   2. Right ventricular systolic function is normal. The right ventricular  size is normal. There is normal pulmonary artery systolic pressure.   3. Left atrial size was mildly dilated.   4. Right atrial size was mildly dilated.   5. Calcified subchordal apparatus. The mitral valve is abnormal. Trivial  mitral valve regurgitation. No evidence of mitral stenosis.   6. The aortic valve is tricuspid. There is mild calcification of the  aortic valve. Aortic valve regurgitation is not visualized. Aortic valve  sclerosis is present, with no evidence of aortic valve stenosis.   7. The inferior vena cava is normal in size with greater than 50%  respiratory variability, suggesting right atrial pressure of 3 mmHg.   Laboratory Data:  High Sensitivity Troponin:  No results for input(s): "TROPONINIHS" in the last 720 hours.   Chemistry Recent Labs  Lab 01/13/22 0448 01/13/22 1521 01/14/22 0400 01/14/22 1615 01/15/22 0431  NA 137   < > 138 136 138  K 3.5   < > 3.7 3.6 3.3*  CL 110   < > 109 107 107  CO2 16*   < > 20* 24 24  GLUCOSE 124*   < > 158* 125* 98  BUN 61*   < > 31* 23 28*  CREATININE 3.55*   < > 2.08* 1.64* 2.09*  CALCIUM 7.7*   < > 7.4* 7.3* 7.4*  MG 2.3  --  2.4  --  2.1  GFRNONAA 13*   < > 24* 32* 24*  ANIONGAP 11   < > '9  5 7   '$ < > = values in this interval not displayed.    Recent Labs  Lab 01/12/22 1423 01/12/22 2123 01/14/22 0400 01/14/22 1615 01/15/22 0431  PROT 4.4*  --  4.5*  --  4.1*  ALBUMIN 2.0*   < > 1.9* 1.8* 1.8*  AST 27  --  20  --  34  ALT 15  --  16  --  20  ALKPHOS 61  --  84  --  155*  BILITOT 0.9  --  0.7  --  0.7   < > = values in this interval not displayed.   Lipids  Recent Labs  Lab 01/14/22 0400  TRIG 42    Hematology Recent Labs  Lab 01/13/22 0448 01/14/22 0400 01/15/22 0431  WBC 14.7* 13.0* 18.8*  RBC 3.64* 3.37* 3.01*  HGB 10.2* 9.4* 8.4*  HCT 31.9* 28.9* 26.8*  MCV 87.6 85.8 89.0  MCH 28.0 27.9 27.9  MCHC 32.0 32.5 31.3  RDW 17.7* 18.0* 18.6*  PLT 144* 145* 185   Thyroid No results for input(s): "TSH", "FREET4" in the last 168 hours.  BNPNo results for input(s): "BNP", "PROBNP" in the last 168 hours.  DDimer No results for input(s): "DDIMER" in the last 168 hours.   Radiology/Studies:  ECHOCARDIOGRAM COMPLETE  Result Date: 01/14/2022    ECHOCARDIOGRAM REPORT   Patient Name:   [name] Date of Exam: 01/14/2022 Medical Rec #:  [id number]    Height:       64.0 in Accession #:    [id number]   Weight:       199.3 lb Date of Birth:  10-20-1945     BSA:          1.953 m Patient Age:    34 years     BP:           121/65 mmHg Patient Gender: F            HR:           98 bpm. Exam Location:  Inpatient Procedure: 2D Echo, Cardiac Doppler and Color Doppler Indications:    Ectopy  History:        Patient has prior history of Echocardiogram examinations, most                 recent 10/22/2017. COPD; Risk Factors:Hypertension and                 Dyslipidemia.  Sonographer:    Jefferey Pica Referring Phys: [id number] [place]  1. Left ventricular ejection fraction, by estimation, is  60 to 65%. The left ventricle has normal function. The left ventricle has no regional wall motion abnormalities. Left ventricular diastolic parameters were normal.  2. Right  ventricular systolic function is normal. The right ventricular size is normal. There is normal pulmonary artery systolic pressure.  3. Left atrial size was mildly dilated.  4. Right atrial size was mildly dilated.  5. Calcified subchordal apparatus. The mitral valve is abnormal. Trivial mitral valve regurgitation. No evidence of mitral stenosis.  6. The aortic valve is tricuspid. There is mild calcification of the aortic valve. Aortic valve regurgitation is not visualized. Aortic valve sclerosis is present, with no evidence of aortic valve stenosis.  7. The inferior vena cava is normal in size with greater than 50% respiratory variability, suggesting right atrial pressure of 3 mmHg. FINDINGS  Left Ventricle: Left ventricular ejection fraction, by estimation, is 60 to 65%. The left ventricle has normal function. The left ventricle has no regional wall motion abnormalities. The left ventricular internal cavity size was normal in size. There is  no left ventricular hypertrophy. Left ventricular diastolic parameters were normal. Right Ventricle: The right ventricular size is normal. No increase in right ventricular wall thickness. Right ventricular systolic function is normal. There is normal pulmonary artery systolic pressure. The tricuspid regurgitant velocity is 2.61 m/s, and  with an assumed right atrial pressure of 5 mmHg, the estimated right ventricular systolic pressure is 96.2 mmHg. Left Atrium: Left atrial size was mildly dilated. Right Atrium: Right atrial size was mildly dilated. Pericardium: There is no evidence of pericardial effusion. Mitral Valve: Calcified subchordal apparatus. The mitral valve is abnormal. There is mild thickening of the mitral valve leaflet(s). There is mild calcification of the mitral valve leaflet(s). Trivial mitral valve regurgitation. No evidence of mitral valve stenosis. Tricuspid Valve: The tricuspid valve is normal in structure. Tricuspid valve regurgitation is mild . No  evidence of tricuspid stenosis. Aortic Valve: The aortic valve is tricuspid. There is mild calcification of the aortic valve. Aortic valve regurgitation is not visualized. Aortic valve sclerosis is present, with no evidence of aortic valve stenosis. Aortic valve peak gradient measures 15.9 mmHg. Pulmonic Valve: The pulmonic valve was normal in structure. Pulmonic valve regurgitation is not visualized. No evidence of pulmonic stenosis. Aorta: The aortic root is normal in size and structure. Venous: The inferior vena cava is normal in size with greater than 50% respiratory variability, suggesting right atrial pressure of 3 mmHg. IAS/Shunts: No atrial level shunt detected by color flow Doppler.  LEFT VENTRICLE PLAX 2D LVIDd:         4.40 cm LVIDs:         3.00 cm LV PW:         0.90 cm LV IVS:        0.90 cm LVOT diam:     1.80 cm LV SV:         65 LV SV Index:   33 LVOT Area:     2.54 cm  RIGHT VENTRICLE             IVC RV S prime:     14.90 cm/s  IVC diam: 2.05 cm LEFT ATRIUM             Index        RIGHT ATRIUM           Index LA diam:        3.90 cm 2.00 cm/m   RA Area:     16.40 cm  LA Vol (A2C):   62.6 ml 32.05 ml/m  RA Volume:   49.60 ml  25.39 ml/m LA Vol (A4C):   44.9 ml 22.99 ml/m LA Biplane Vol: 53.5 ml 27.39 ml/m  AORTIC VALVE                 PULMONIC VALVE AV Area (Vmax): 1.98 cm     PV Vmax:       1.23 m/s AV Vmax:        199.50 cm/s  PV Peak grad:  6.1 mmHg AV Peak Grad:   15.9 mmHg LVOT Vmax:      155.00 cm/s LVOT Vmean:     97.300 cm/s LVOT VTI:       0.257 m  AORTA Ao Root diam: 2.80 cm Ao Asc diam:  3.10 cm TRICUSPID VALVE TR Peak grad:   27.2 mmHg TR Vmax:        261.00 cm/s  SHUNTS Systemic VTI:  0.26 m Systemic Diam: 1.80 cm Cindy Rouge MD Electronically signed by Cindy Rouge MD Signature Date/Time: 01/14/2022/3:20:12 PM    Final    DG CHEST PORT 1 VIEW  Result Date: 01/12/2022 CLINICAL DATA:  Central line placement. EXAM: PORTABLE CHEST 1 VIEW COMPARISON:  Chest radiograph 01/10/2022  FINDINGS: New left internal jugular dialysis catheter tip overlies the upper SVC. No pneumothorax. The endotracheal tube tip is now 2.8 cm from the carina. Right internal jugular central line and enteric tube remain in place. Unchanged bilateral pleural effusions and bilateral airspace disease. Stable cardiomegaly. IMPRESSION: 1. New left internal jugular dialysis catheter with tip overlying the upper SVC. No pneumothorax. 2. Endotracheal tube tip now 2.8 cm from the carina. Stable right central line and enteric tube. 3. Unchanged bilateral pleural effusions and bilateral airspace disease. Electronically Signed   By: Keith Rake M.D.   On: 01/12/2022 19:06     Assessment and Plan:   New onset atrial fibrillation  Frequent PVCs  - Patient was noted to have developed atrial fibrillation while in the ICU on pressors, vent. She was admitted for treatment of septic shock post SBO, aspiration pneumonia. She was started on IV amiodarone and IV heparin  - Now, patient is extubated and off pressors but BP is still somewhat labile  - Echo this admission with EF 60-65%, no regional wall motion abnormalities, normal LV diastolic parameters, mildly dilated LA and RA  - CHADS-VASc 6 (CHF, HTN, diabetes, age x2, gender) - Patient now on PO amidarone-- transition from 200 mg TID to 400 mg BID for one week, followed by 200 mg BID  - When BP can tolerate it, start a BB to help with rate control and to help suppress PVCs. - Transition from IV heparin to DOAC when hemoglobin stabilizes (hemoglobin has been slightly downtrending so far on IV heparin, 10.2>>9.4>>8.4 ). - We will follow patient as an outpatient and consider cardioversion after 3 weeks of uninterrupted anticoagulation   Chronic Lower Extremity Edema - Patient has a long history of chronic lower extremity edema, has been treated with lasix in the past and has a home pump  - OK to hold lasix until BP and renal function improved  - Consider compression  stockings PRN while admitted   Otherwise per primary  - SBO s/p laparotomy 8/17 - AKI -- off CRRT 8/21 - Protein malnutrition  - Chronic anemia   Risk Assessment/Risk Scores:   CHA2DS2-VASc Score = 6  This indicates a 9.7% annual risk of stroke. The patient's score  is based upon: CHF History: 1 HTN History: 1 Diabetes History: 1 Stroke History: 0 Vascular Disease History: 0 Age Score: 2 Gender Score: 1      For questions or updates, please contact [place] Please consult [email or web address] for contact info under    Signed, Margie Billet, PA-C  01/15/2022 3:15 PM   Personally seen and examined. Agree with APP above with the following comments:  Briefly 76 yo F with a history of PF, Prior diastolic HF,  who presents with complicated SPBO and sepsis s/p exploratory laparotomy and adhesiolysis.  Complicated by AKI requiring CRRT.  01/13/22 found to have new atrial fibrillation.  Started on amiodarone and heparin.  With this Hgb has started to decline, but without clear bleeding diathesis.    She has had improved rate control.  She has not been able to take AV nodal therapy in the setting of hypotension.  Patient notes that things have been a whirlwind.  She is taking in all of her hospitalization.   No CP, no SOB.  NO palpitations.  Shes does not remember if she had palpitations when she was in RVR.  Exam notable for elderly female, in NAD.  Difficult hearing. Bilateral LE edema, CTAB at time of eval lines C/D/I  EKG AF rate controlled Tele: AF rate controlled; as som PVC; also has some aberrant conduction.  Would recommend  - agree with above - will transition to PO amiodarone with plans for maintenance dosing - as BP recovers, may add BB for improved rate - Hgb has dropped every day she has been on heparin; once stable, will transition to [facility] - nephrology is following for volume status (potentially needing iHD); some of her LE edema is related to protein  calorie malnutrition  Rudean Haskell, MD [place]  [address] [place], [place] [address] [phone]  4:49 PM

## 2022-01-15 NOTE — Progress Notes (Addendum)
Patient ID: [name], female   DOB: 1945-08-07, 76 y.o.   MRN: [id number]   Acute Care Surgery Service Progress Note:    Chief Complaint/Subjective: Extubated yesterday, A&Ox3 this AM. Denies abd pain. Has a weak voice. Denies flatus or stool. RN confirms no BM last 24 hours. BMx2 reported in epic yesterday.   Objective: Vital signs in last 24 hours: Temp:  [97.8 F (36.6 C)-99.4 F (37.4 C)] 99.4 F (37.4 C) (08/22 0354) Pulse Rate:  [79-112] 86 (08/22 0800) Resp:  [0-23] 16 (08/22 0800) BP: (79-147)/(52-110) 125/70 (08/22 0800) SpO2:  [91 %-100 %] 99 % (08/22 0821) Arterial Line BP: (94-161)/(48-73) 139/69 (08/21 1145) FiO2 (%):  [30 %] 30 % (08/21 1018) Weight:  [94.4 kg] 94.4 kg (08/22 0500) Last BM Date : 01/13/22  Intake/Output from previous day: 08/21 0701 - 08/22 0700 In: 2931.6 [I.V.:2431.5; IV Piggyback:500.1] Out: 315 [Urine:15; Emesis/NG output:300] Intake/Output this shift: Total I/O In: 518 [I.V.:283.1; IV Piggyback:234.9] Out: 50 [Emesis/NG output:50]  Lungs: normal effort on [place]  Cardiovascular: reg  Abd: soft, min distension, honeycomb dressing c/d/I; overall nontender  Extremities: there is some upper and lower extremity edema, +SCDs  Neuro: alert, nonfocal, follows commands  Lab Results: CBC  Recent Labs    01/14/22 0400 01/15/22 0431  WBC 13.0* 18.8*  HGB 9.4* 8.4*  HCT 28.9* 26.8*  PLT 145* 185   BMET Recent Labs    01/14/22 1615 01/15/22 0431  NA 136 138  K 3.6 3.3*  CL 107 107  CO2 24 24  GLUCOSE 125* 98  BUN 23 28*  CREATININE 1.64* 2.09*  CALCIUM 7.3* 7.4*   LFT    Latest Ref Rng & Units 01/15/2022    4:31 AM 01/14/2022    4:15 PM 01/14/2022    4:00 AM  Hepatic Function  Total Protein 6.5 - 8.1 g/dL 4.1   4.5   Albumin 3.5 - 5.0 g/dL 1.8  1.8  1.9   AST 15 - 41 U/L 34   20   ALT 0 - 44 U/L 20   16   Alk Phosphatase 38 - 126 U/L 155   84   Total Bilirubin 0.3 - 1.2 mg/dL 0.7   0.7    PT/INR No results for input(s):  "LABPROT", "INR" in the last 72 hours. ABG No results for input(s): "PHART", "HCO3" in the last 72 hours.  Invalid input(s): "PCO2", "PO2"  Studies/Results:  Anti-infectives: Anti-infectives (From admission, onward)    Start     Dose/Rate Route Frequency Ordered Stop   01/15/22 0526  aztreonam (AZACTAM) 1 g in sodium chloride 0.9 % 100 mL IVPB  Status:  Discontinued       See Hyperspace for full Linked Orders Report.   1 g 200 mL/hr over 30 Minutes Intravenous Every 24 hours 01/14/22 1529 01/14/22 1534   01/15/22 0526  aztreonam (AZACTAM) 1 g in sodium chloride 0.9 % 100 mL IVPB       See Hyperspace for full Linked Orders Report.   1 g 200 mL/hr over 30 Minutes Intravenous Every 24 hours 01/14/22 1534 01/15/22 0504   01/13/22 2100  vancomycin (VANCOCIN) IVPB 1000 mg/200 mL premix  Status:  Discontinued        1,000 mg 200 mL/hr over 60 Minutes Intravenous Every 24 hours 01/12/22 2135 01/13/22 1027   01/13/22 0500  aztreonam (AZACTAM) 2 g in sodium chloride 0.9 % 100 mL IVPB  Status:  Discontinued       See Hyperspace  for full Linked Orders Report.   2 g 200 mL/hr over 30 Minutes Intravenous Every 12 hours 01/12/22 2135 01/14/22 1529   01/12/22 2015  vancomycin (VANCOREADY) IVPB 1750 mg/350 mL        1,750 mg 175 mL/hr over 120 Minutes Intravenous  Once 01/12/22 1921 01/12/22 2259   01/11/22 1800  aztreonam (AZACTAM) 1 g in sodium chloride 0.9 % 100 mL IVPB  Status:  Discontinued       See Hyperspace for full Linked Orders Report.   1 g 200 mL/hr over 30 Minutes Intravenous Every 8 hours 01/11/22 0815 01/12/22 2135   01/11/22 1000  levofloxacin (LEVAQUIN) IVPB 500 mg  Status:  Discontinued        500 mg 100 mL/hr over 60 Minutes Intravenous Every 48 hours 01/10/22 1432 01/11/22 0744   01/11/22 0915  aztreonam (AZACTAM) 2 g in sodium chloride 0.9 % 100 mL IVPB       See Hyperspace for full Linked Orders Report.   2 g 200 mL/hr over 30 Minutes Intravenous  Once 01/11/22 0815  01/11/22 1045   01/11/22 0845  aztreonam (AZACTAM) 2 g in sodium chloride 0.9 % 100 mL IVPB  Status:  Discontinued        2 g 200 mL/hr over 30 Minutes Intravenous  Once 01/11/22 0747 01/11/22 0815   01/11/22 0845  metroNIDAZOLE (FLAGYL) IVPB 500 mg  Status:  Discontinued        500 mg 100 mL/hr over 60 Minutes Intravenous Every 12 hours 01/11/22 0747 01/15/22 0851   01/10/22 1900  levofloxacin (LEVAQUIN) IVPB 500 mg  Status:  Discontinued        500 mg 100 mL/hr over 60 Minutes Intravenous Every 24 hours 01/10/22 1426 01/10/22 1432   01/10/22 0700  ciprofloxacin (CIPRO) IVPB 400 mg        400 mg 200 mL/hr over 60 Minutes Intravenous On call to O.R. 01/10/22 0612 01/10/22 1052   01/10/22 0700  metroNIDAZOLE (FLAGYL) IVPB 500 mg  Status:  Discontinued        500 mg 100 mL/hr over 60 Minutes Intravenous On call to O.R. 01/10/22 0612 01/10/22 1123   01/08/22 0830  Levofloxacin (LEVAQUIN) IVPB 250 mg  Status:  Discontinued        250 mg 50 mL/hr over 60 Minutes Intravenous Every 24 hours 01/08/22 0724 01/08/22 0727   01/08/22 0830  Levofloxacin (LEVAQUIN) IVPB 250 mg  Status:  Discontinued        250 mg 50 mL/hr over 60 Minutes Intravenous Every 48 hours 01/08/22 0727 01/08/22 0732   01/08/22 0830  Levofloxacin (LEVAQUIN) IVPB 250 mg  Status:  Discontinued        250 mg 50 mL/hr over 60 Minutes Intravenous Every 24 hours 01/08/22 0732 01/10/22 1426       Medications: Scheduled Meds:  Chlorhexidine Gluconate Cloth  6 each Topical Daily   insulin aspart  0-15 Units Subcutaneous Q4H   sodium chloride flush  10-40 mL Intracatheter Q12H   sodium chloride flush  3 mL Intravenous Q12H   Continuous Infusions:   prismasol BGK 4/2.5 400 mL/hr at 01/14/22 1014    prismasol BGK 4/2.5 400 mL/hr at 01/14/22 1014   sodium chloride 250 mL (01/15/22 0747)   sodium chloride Stopped (01/15/22 0751)   sodium chloride     amiodarone 30 mg/hr (01/15/22 0800)   heparin 1,250 Units/hr (01/15/22 0800)    potassium chloride 10 mEq (01/15/22 0853)   prismasol BGK  4/2.5 1,500 mL/hr at 01/14/22 1336   TPN ADULT (ION) 35 mL/hr at 01/15/22 0800   TPN ADULT (ION)     PRN Meds:.sodium chloride, Place/Maintain arterial line **AND** sodium chloride, albuterol, alteplase, heparin, HYDROmorphone (DILAUDID) injection, lip balm, [DISCONTINUED] ondansetron **OR** ondansetron (ZOFRAN) IV, mouth rinse, sodium chloride, sodium chloride flush, sodium chloride flush  Assessment/Plan: Patient Active Problem List   Diagnosis Date Noted   Malnutrition of moderate degree 01/14/2022   Respiratory failure requiring intubation ([place]) 01/11/2022    Class: Acute   Acute respiratory failure with hypoxia and hypercapnia ([place]) 01/10/2022   Septic shock ([place]) 01/10/2022   Pressure injury of skin 01/10/2022   AKI (acute kidney injury) ([place]) 01/09/2022   Small bowel obstruction ([place]) 01/07/2022   Abnormal weight loss 11/28/2021   Family history of malignant neoplasm of digestive organs 11/28/2021   Intestinal malabsorption 11/28/2021   Renal insufficiency 12/08/2020   Degeneration of lumbar intervertebral disc 01/07/2020   Venous (peripheral) insufficiency 09/30/2019   Anemia 08/12/2017   Vitamin D deficiency 08/12/2017   Lumbar spondylosis with myelopathy 06/03/2017   Primary osteoarthritis of both knees 03/19/2017   Anxiety 02/17/2017   History of pulmonary embolus (PE) 07/09/2016   COPD (chronic obstructive pulmonary disease) ([place]) 01/18/2016   Hyperlipidemia 01/18/2016   Depression 01/18/2016   Essential hypertension 01/18/2016   H/O spinal fusion 01/18/2016   Hypothyroidism (acquired) 01/18/2016   Thoracic aorta atherosclerosis ([place]) 01/18/2016   Chronic cholecystitis with calculus 10/04/2013   Data reviewed - vitals x 24 hrs, nursing note x 24hrs, CCM note, renal note  POD#5 - status post ex lap with lysis of adhesions - Dr. Constance Haw ([facility])  Afebrile WBC 18 (from 62) - monitor, based on CXR and patient  cough have question if part of this could be respiratory driven extubated 2/13, NG with minimal output (300 cc/24h and only 100cc last shift) - remove today  SLP eval prior to starting CLD, pt with weak voice and a cough              continue full support TPN             Await further resolution of ileus - no documented bowel function last 24h , BM x2 8/21             Will follow with you FEN: remove NG, NPO until SLP eval, TPN ID: aztreonam 8/18 >> consider stopping tomorrow 2359 after 5 days post-op.  VTE: hep gtt Foley: in place    Afib - IV hep AKI - CRRT stopped 8/21 ABL anemia - hgb 8.4 from 9.4 after starting TPN yesterday, overall stable and no s/s bleeding    LOS: 8 days    Cindy Dredge, PA-C General & Trauma Surgery [phone] [facility]

## 2022-01-15 NOTE — Progress Notes (Signed)
[place] for Heparin Indication: atrial fibrillation  Allergies  Allergen Reactions   Celebrex [Celecoxib] Swelling   Keflet [Cephalexin] Swelling   Penicillins Hives and Swelling    DID THE REACTION INVOLVE: Swelling of the face/tongue/throat, SOB, or low BP? Yes-swelling-hives Sudden or severe rash/hives, skin peeling, or the inside of the mouth or nose? Unknown Did it require medical treatment? Unknown When did it last happen?    over 10 years   If all above answers are "NO", may proceed with cephalosporin use.    Sulfa Antibiotics Swelling   Kenalog [Triamcinolone Acetonide]     unknown   Latex    Lisinopril Other (See Comments)    unknown   Mobic [Meloxicam]     SWELLING    Patient Measurements: Height: '5\' 4"'$  (162.6 cm) Weight: 94.4 kg (208 lb 1.8 oz) IBW/kg (Calculated) : 54.7 Heparin Dosing Weight: 71.5 kg  Vital Signs: Temp: 98.4 F (36.9 C) (08/22 1147) Temp Source: Oral (08/22 1147) BP: 120/75 (08/22 1200) Pulse Rate: 82 (08/22 1200)  Labs: Recent Labs    01/13/22 0448 01/13/22 1521 01/14/22 0400 01/14/22 1248 01/14/22 1615 01/15/22 0431 01/15/22 1251  HGB 10.2*  --  9.4*  --   --  8.4*  --   HCT 31.9*  --  28.9*  --   --  26.8*  --   PLT 144*  --  145*  --   --  185  --   APTT 50*  --  197*  --   --   --   --   HEPARINUNFRC  --    < > 0.49 0.35  --  0.21* <0.10*  CREATININE 3.55*   < > 2.08*  --  1.64* 2.09*  --    < > = values in this interval not displayed.     Estimated Creatinine Clearance: 25.9 mL/min (A) (by C-G formula based on SCr of 2.09 mg/dL (H)).   Assessment: Pharmacy consulted to start heparin infusion for post-op afib. Patient was not on any AC PTA.    Heparin level subtherapeutic, <0.10. No bleeding concerns or issues with the line per RN. Systemic heparin at 1250 units/hr. Hgb down slightly at 8.4, platelets stable at 185.   Goal of Therapy:  Heparin level 0.3-0.7 units/ml Monitor  platelets by anticoagulation protocol: Yes   Plan:  Increase heparin to 1350 units/hr Check a heparin level in 8 hours  Continue daily heparin levels   Vicenta Dunning, PharmD  PGY1 Pharmacy Resident

## 2022-01-15 NOTE — Progress Notes (Signed)
Patient signed out to [facility]. They will assume care 8/23 at 7am.

## 2022-01-15 NOTE — Progress Notes (Signed)
NAME:  [name], MRN:  [id number], DOB:  [birth year], LOS: 8 ADMISSION DATE:  01/07/2022, CONSULTATION DATE:  8/17 REFERRING MD:  Leafy Half, [facility], CHIEF COMPLAINT:  post op circulatory shock/ resp failure    History of Present Illness:  73 yowf never smoker with "COPD" (not supported by pfts)   hyperlipidemia, essential hypertension, hypothyroidism, pulmonary embolism in the past, osteoarthritis, and depression who presents from home with vomiting and diarrhea since 8/11 when  presented with  acute onset of abdominal pain CT scan was obtained and was unremarkable she received some IV fluids and antiemetics and was discharged home.  Her V and D persisted and when  previous treatment did not alleviate her symptoms   she presented to [facility].  CT scan was obtained and found to show a partial small bowel obstruction and significant changes compared to the CT scan she had on 8/11.   ED Course: Patient was given an oral dose of Zofran and some IV fluid drip.  She continued to have persistent nausea in the emergency department and vomited at least 200 mL  witnessed  She was given additional Zofran at that point in her IV.  Also had 1 episode of diarrhea in the emergency department which is described as small vol. Labs were consistent with acute kidney injury and hypokalemia CT scan showed a partial small bowel obstruction.     To OR 8/17 Exploratory laparotomy lysis of adhesion and remained on vent/ on pressors post op and PCCM service consulted.   Worsening renal failure despite volume resuscitation and ongoing pressors and MV requirements. Transferred to [facility] for CRRT.     Significant Hospital Events: Including procedures, antibiotic start and stop dates in addition to other pertinent events   8/14 admitted; bowel obstruction managed medically 8/15 renal function worsening 8/17  Went into shock> emergent exploratory laparotomy w/ lysis of adhesion, intubated, RIJ CVC placed in OR 8/18 worsening renal  function 8/19 arrived at [facility], starting CRRT 8/21 extubated, TNA started, CRRT discontinued  Interim History / Subjective:  CRRT discontinued yesterday PM. Confused overnight. Remains in Afib.  Subjective: Sleepy, states that pain in abdomen has decreased from yesterday.  Objective   Blood pressure (!) 122/110, pulse 88, temperature 99.4 F (37.4 C), temperature source Oral, resp. rate 16, height '5\' 4"'$  (1.626 m), weight 94.4 kg, SpO2 99 %.    Vent Mode: PRVC FiO2 (%):  [30 %] 30 % Set Rate:  [18 bmp] 18 bmp Vt Set:  [440 mL] 440 mL PEEP:  [5 cmH20] 5 cmH20 Pressure Support:  [10 cmH20] 10 cmH20   Intake/Output Summary (Last 24 hours) at 01/15/2022 0821 Last data filed at 01/15/2022 0700 Gross per 24 hour  Intake 2766.6 ml  Output 315 ml  Net 2451.6 ml    Filed Weights   01/13/22 1236 01/14/22 0500 01/15/22 0500  Weight: 89.8 kg 90.4 kg 94.4 kg       Examination: General: ill appearing woman lying in bed, in NAD HEENT: [place]/AT, eyes anicteric, OGT in place Cardio: S1S2, RRR Resp: CTAB, breathing comfortably on 4L  Abd: soft, ND, hypoactive bowel sounds. moderate gastric secretions from NGT, midline incision in place Extremities: no cyanosis or clubbing. Minimal edema. Derm: pallor, no diffuse rashes Neuro: weak but following commands, hard of hearing  Labs/ imaging: Na+ 138 K3.3 Bicarb 24 BUN 56 Cr 2.09 WBC 13>18.8 H/H 8.4/26.8 Platelets 185  BG 78-150s  I 3L O 215 cc NG  N +14L  8/17 blood cultures:  NGTD Urine culture: polymicrobial Trach aspirate> rare with normal respiratory flora  Echo showed EF 60-65% with no left ventricle wall motion abnormalities.  Resolved: Acute hypoxic respiratory failure Septic shock Hyponatremia NAGMA  Assessment & Plan:  SBO s/p laparotomy 8/17 Surgery following. NPO with NG to intermittent suction. TPN started 8/21. -completed course of aztreonam with metronidazole -follow cultures until finalized -general  surgery following -IV dilaudid 0.25-0.5 mg q 6 hrs PRN  Afib with RVR Echo unchanged from 2019 with EF 60-65%. Switch IV amiodarone to PO amiodarone. -Switch IV to PO amiodarone -con't electrolyte monitoring  AKI - likely ATN from sepsis and prolonged pre-renal state. ARB prior to admit CRRT discontinued 8/21. She may still need intermittent HD. Nephrology following. -appreciate Nephrology's management, CRRT -con't holding ARB  Moderate protein energy malnutrition -TPN started 8/21  Chronic anemia -transfuse for Hb <7 or hemodynamically significant bleeding  Best Practice (right click and "Reselect all SmartList Selections" daily)  GI: N/A DVT: heparin Nutrition: TPN Lines & tubes: CVC for iHD Family discussions: pending  Christiana Fuchs, DO 01/15/22 8:21 AM [facility], PGY-2

## 2022-01-15 NOTE — Progress Notes (Addendum)
PHARMACY - TOTAL PARENTERAL NUTRITION CONSULT NOTE   Indication: Prolonged ileus  Patient Measurements: Height: '5\' 4"'$  (162.6 cm) Weight: 94.4 kg (208 lb 1.8 oz) IBW/kg (Calculated) : 54.7 TPN AdjBW (KG): 60.7 Body mass index is 35.72 kg/m.  Assessment: 76 yo female with septic shock, high-grade small bowel obstruction. Underwent surgery on 8/17 with ex lap and lysis of adhesions for SBO. Reported vomiting and diarrhea since 8/11.  Pharmacy consulted for TPN due to prolonged ileus.  Glucose / Insulin: CBG  <180, 0 units mSSI last 24 hours  Electrolytes: Na within normal limits.  K 3.3, Mag 2.1 (goal K >4, Mag >2), Phos 2, CO2 low 2, CoCa 9.1  Renal: AKI off CRRT Hepatic: LFTs wnl, Alb 1.8 Intake / Output; MIVF: Net + 2.7L (+11 for admit) GI Imaging: GI Surgeries / Procedures:  8/17 with ex lap and lysis of adhesions for SBO.   Central access: Triple lumen CVC (placed 8/17) TPN start date: 01/14/22  Nutritional Goals: Goal TPN rate is 75 mL/hr (provides 115 g of protein and 1800 kcals per day - 64 gm/l of protein, 15.3% dextrose, and 25 gm/l lf lipid)  RD Assessment: Estimated Needs Total Energy Estimated Needs: 1700-1900 Total Protein Estimated Needs: 100-120 gm Total Fluid Estimated Needs: 1.7-1.9 L  Current Nutrition:  NPO  Plan:  Start TPN at 75 mL/hr at 1800- providing 100% of nutrition goals.  Electrolytes in TPN: Removed due to changing renal status: Na 46mq/L, K 021m/L, Ca 36m23mL, Mg 36mE62m, and Phos 36mmo66m. Max acetate for now. Add standard MVI and trace elements to TPN -remove chromium HD Continue Moderate q4h SSI and adjust as needed  KCL replaced by provider Monitor TPN labs on Mon/Thurs, Daily for next few days  CathyAlanda SlimrmD, FCCM [facility] Pharmacist Please see AMION for all Pharmacists' Contact Phone Numbers 01/15/2022, 8:20 AM

## 2022-01-15 NOTE — Addendum Note (Signed)
Addendum  created 01/15/22 1408 by Denese Killings, MD   Clinical Note Signed

## 2022-01-15 NOTE — Evaluation (Signed)
Clinical/Bedside Swallow Evaluation Patient Details  Name: [name] MRN: [id number] Date of Birth: 04-25-1946  Today's Date: 01/15/2022 Time: SLP Start Time (ACUTE ONLY): 9211 SLP Stop Time (ACUTE ONLY): 9417 SLP Time Calculation (min) (ACUTE ONLY): 25 min  Past Medical History:  Past Medical History:  Diagnosis Date   Allergy    Bronchitis, chronic (HCC)    Chronic anxiety    Complication of anesthesia    hard to wake up   COPD (chronic obstructive pulmonary disease) (HCC)    Depression    Esophageal reflux    Headache(784.0)    Hyperlipidemia    Hypertension    Hypothyroidism    Obesity    Osteoporosis    Overactive bladder    PVC's (premature ventricular contractions)    Thyroid disease    Vitamin D deficiency disease    Past Surgical History:  Past Surgical History:  Procedure Laterality Date   ABDOMINAL HYSTERECTOMY     CENTRAL LINE INSERTION Right 01/10/2022   Procedure: CENTRAL LINE INSERTION;  Surgeon: Virl Cagey, MD;  Location: [facility];  Service: General;  Laterality: Right;   CHOLECYSTECTOMY N/A 11/04/2013   Procedure: LAPAROSCOPIC CHOLECYSTECTOMY WITH INTRAOPERATIVE CHOLANGIOGRAM;  Surgeon: Imogene Burn. Georgette Dover, MD;  Location: [facility];  Service: General;  Laterality: N/A;   COLONOSCOPY     FRACTURE SURGERY     pins removed from Hip surgery   HIP FRACTURE SURGERY  1990    pins removed in [place] N/A 01/10/2022   Procedure: EXPLORATORY LAPAROTOMY,;  Surgeon: Virl Cagey, MD;  Location: [facility];  Service: General;  Laterality: N/A;   LYSIS OF ADHESION N/A 01/10/2022   Procedure: LYSIS OF ADHESION;  Surgeon: Virl Cagey, MD;  Location: [facility];  Service: General;  Laterality: N/A;   PARTIAL HYSTERECTOMY  1987   SPINAL FUSION     UPPER GASTROINTESTINAL ENDOSCOPY     HPI:  Patient is a 76 y.o. female with PMH: COPD, HLD, essential HTN, hypothyroidism, PE, osteoarthritis, depression, GERD. She presented initially to urgent care on 01/04/22  with acute onset abdominal pain, had CT which was unremarkable and she received IV fluids and antiemetics and was discharged hom. She presented to the hospital from home on 01/07/22 with vomiting and diarrhea since 01/04/22. In ED CT scan showed partial SBO. Surgery consulted and NG ordered. 8/15, had worsening renal function; 8/17 she went into shock and required emergent Exploratory laparotomy performed in OR and patient remained on vent on pressors post op. She arrived at [facility] on 8/19 and CRRT started; 8/21 she was extubated, TNA started and CRRT discontinued. Patient is NPO awaiting SLP swallow evaluation with surgery recommending clear liquids if she is able to safely swallow.    Assessment / Plan / Recommendation  Clinical Impression  Patient presents with what appears to be a mild post-extubation dysphagia. She had NG pulled out prior to SLP starting this bedside/clinical swallow evaluation. When SLP entered room, she indicated that she had something in her mouth and when SLP held washcloth up to her mouth she proceeded to expectorate yellowish-clear liquid secretions. She had two more incidents of mild wretching followed by expectoration of some secretions. Vocal intensity was low but speech is fairly clear. She drank thin liquids via straw sips with suspected mildly delayed swallow initiation but no overt s/s aspiration or penetration. She did grimace with several of the swallows and indicated she was having some discomfort during swallow. No change in vitals with SpO2  and RR staying WFL during and after PO intake. No other PO's tested except water secondary to surgery recommendations. SLP is recommending thin liquids and will follow patient for toleration and ability to advance. SLP Visit Diagnosis: Dysphagia, unspecified (R13.10)    Aspiration Risk  Mild aspiration risk    Diet Recommendation Thin liquid   Liquid Administration via: Cup;Straw Medication Administration: Other (Comment) (as  tolerated) Supervision: Full supervision/cueing for compensatory strategies;Staff to assist with self feeding Compensations: Small sips/bites;Slow rate Postural Changes: Seated upright at 90 degrees;Remain upright for at least 30 minutes after po intake    Other  Recommendations Oral Care Recommendations: Staff/trained caregiver to provide oral care;Oral care BID;Oral care before and after PO    Recommendations for follow up therapy are one component of a multi-disciplinary discharge planning process, led by the attending physician.  Recommendations may be updated based on patient status, additional functional criteria and insurance authorization.  Follow up Recommendations Other (comment) (TBD)      Assistance Recommended at Discharge Frequent or constant Supervision/Assistance  Functional Status Assessment Patient has had a recent decline in their functional status and demonstrates the ability to make significant improvements in function in a reasonable and predictable amount of time.  Frequency and Duration min 2x/week  1 week       Prognosis Prognosis for Safe Diet Advancement: Good      Swallow Study   General Date of Onset: 01/15/22 HPI: Patient is a 76 y.o. female with PMH: COPD, HLD, essential HTN, hypothyroidism, PE, osteoarthritis, depression, GERD. She presented initially to urgent care on 01/04/22 with acute onset abdominal pain, had CT which was unremarkable and she received IV fluids and antiemetics and was discharged hom. She presented to the hospital from home on 01/07/22 with vomiting and diarrhea since 01/04/22. In ED CT scan showed partial SBO. Surgery consulted and NG ordered. 8/15, had worsening renal function; 8/17 she went into shock and required emergent Exploratory laparotomy performed in OR and patient remained on vent on pressors post op. She arrived at [facility] on 8/19 and CRRT started; 8/21 she was extubated, TNA started and CRRT discontinued. Patient is NPO awaiting  SLP swallow evaluation with surgery recommending clear liquids if she is able to safely swallow. Type of Study: Bedside Swallow Evaluation Previous Swallow Assessment: none found Diet Prior to this Study: NPO Temperature Spikes Noted: No Respiratory Status: Nasal cannula History of Recent Intubation: Yes Length of Intubations (days): 5 days Date extubated: 01/14/22 Behavior/Cognition: Alert;Cooperative;Pleasant mood Oral Cavity Assessment: Within Functional Limits Oral Care Completed by SLP: Yes Oral Cavity - Dentition: Missing dentition;Poor condition;Other (Comment) (missing most dentition) Vision: Functional for self-feeding Self-Feeding Abilities: Able to feed self Patient Positioning: Upright in bed Baseline Vocal Quality: Low vocal intensity Volitional Cough: Weak Volitional Swallow: Able to elicit    Oral/Motor/Sensory Function Overall Oral Motor/Sensory Function: Within functional limits   Ice Chips     Thin Liquid Thin Liquid: Impaired Presentation: Straw;Self Fed Pharyngeal  Phase Impairments: Suspected delayed Swallow Other Comments: patient with mild grimacing during swallow and endorsing some soreness/discomfort likely from post-extubation    Nectar Thick     Honey Thick     Puree Puree: Not tested   Solid     Solid: Not tested      Sonia Baller, MA, CCC-SLP Speech Therapy

## 2022-01-16 ENCOUNTER — Inpatient Hospital Stay (HOSPITAL_COMMUNITY): Payer: Medicare Other

## 2022-01-16 ENCOUNTER — Other Ambulatory Visit (HOSPITAL_COMMUNITY): Payer: Self-pay

## 2022-01-16 DIAGNOSIS — R6521 Severe sepsis with septic shock: Secondary | ICD-10-CM | POA: Diagnosis not present

## 2022-01-16 DIAGNOSIS — E46 Unspecified protein-calorie malnutrition: Secondary | ICD-10-CM | POA: Diagnosis not present

## 2022-01-16 DIAGNOSIS — A419 Sepsis, unspecified organism: Secondary | ICD-10-CM | POA: Diagnosis not present

## 2022-01-16 DIAGNOSIS — R600 Localized edema: Secondary | ICD-10-CM | POA: Diagnosis not present

## 2022-01-16 DIAGNOSIS — J9601 Acute respiratory failure with hypoxia: Secondary | ICD-10-CM | POA: Diagnosis not present

## 2022-01-16 LAB — COMPREHENSIVE METABOLIC PANEL
ALT: 17 U/L (ref 0–44)
AST: 25 U/L (ref 15–41)
Albumin: 1.8 g/dL — ABNORMAL LOW (ref 3.5–5.0)
Alkaline Phosphatase: 128 U/L — ABNORMAL HIGH (ref 38–126)
Anion gap: 6 (ref 5–15)
BUN: 43 mg/dL — ABNORMAL HIGH (ref 8–23)
CO2: 22 mmol/L (ref 22–32)
Calcium: 7.7 mg/dL — ABNORMAL LOW (ref 8.9–10.3)
Chloride: 106 mmol/L (ref 98–111)
Creatinine, Ser: 2.75 mg/dL — ABNORMAL HIGH (ref 0.44–1.00)
GFR, Estimated: 17 mL/min — ABNORMAL LOW (ref >60)
Glucose, Bld: 113 mg/dL — ABNORMAL HIGH (ref 70–99)
Potassium: 3.3 mmol/L — ABNORMAL LOW (ref 3.5–5.1)
Sodium: 134 mmol/L — ABNORMAL LOW (ref 135–145)
Total Bilirubin: 0.6 mg/dL (ref 0.3–1.2)
Total Protein: 4.1 g/dL — ABNORMAL LOW (ref 6.5–8.1)

## 2022-01-16 LAB — CBC
HCT: 28.2 % — ABNORMAL LOW (ref 36.0–46.0)
Hemoglobin: 8.8 g/dL — ABNORMAL LOW (ref 12.0–15.0)
MCH: 28 pg (ref 26.0–34.0)
MCHC: 31.2 g/dL (ref 30.0–36.0)
MCV: 89.8 fL (ref 80.0–100.0)
Platelets: 202 10*3/uL (ref 150–400)
RBC: 3.14 MIL/uL — ABNORMAL LOW (ref 3.87–5.11)
RDW: 18.9 % — ABNORMAL HIGH (ref 11.5–15.5)
WBC: 19.5 10*3/uL — ABNORMAL HIGH (ref 4.0–10.5)
nRBC: 0.1 % (ref 0.0–0.2)

## 2022-01-16 LAB — GLUCOSE, CAPILLARY
Glucose-Capillary: 101 mg/dL — ABNORMAL HIGH (ref 70–99)
Glucose-Capillary: 102 mg/dL — ABNORMAL HIGH (ref 70–99)
Glucose-Capillary: 106 mg/dL — ABNORMAL HIGH (ref 70–99)
Glucose-Capillary: 124 mg/dL — ABNORMAL HIGH (ref 70–99)
Glucose-Capillary: 126 mg/dL — ABNORMAL HIGH (ref 70–99)
Glucose-Capillary: 97 mg/dL (ref 70–99)

## 2022-01-16 LAB — BLOOD GAS, ARTERIAL
Acid-Base Excess: 3.9 mmol/L — ABNORMAL HIGH (ref 0.0–2.0)
Bicarbonate: 27.4 mmol/L (ref 20.0–28.0)
Drawn by: 54887
O2 Saturation: 94.3 %
Patient temperature: 37
pCO2 arterial: 36 mmHg (ref 32–48)
pH, Arterial: 7.49 — ABNORMAL HIGH (ref 7.35–7.45)
pO2, Arterial: 58 mmHg — ABNORMAL LOW (ref 83–108)

## 2022-01-16 LAB — MAGNESIUM: Magnesium: 2.1 mg/dL (ref 1.7–2.4)

## 2022-01-16 LAB — HEPATITIS B SURFACE ANTIBODY, QUANTITATIVE: Hep B S AB Quant (Post): <3.1 m[IU]/mL — ABNORMAL LOW (ref >9.9)

## 2022-01-16 LAB — HEPARIN LEVEL (UNFRACTIONATED)
Heparin Unfractionated: 0.15 IU/mL — ABNORMAL LOW (ref 0.30–0.70)
Heparin Unfractionated: 0.29 IU/mL — ABNORMAL LOW (ref 0.30–0.70)

## 2022-01-16 LAB — PHOSPHORUS: Phosphorus: 1.4 mg/dL — ABNORMAL LOW (ref 2.5–4.6)

## 2022-01-16 MED ORDER — ACETAMINOPHEN 325 MG PO TABS
650.0000 mg | ORAL_TABLET | Freq: Four times a day (QID) | ORAL | Status: DC | PRN
Start: 2022-01-16 — End: 2022-02-04
  Administered 2022-01-16 – 2022-01-31 (×9): 650 mg via ORAL
  Filled 2022-01-16 (×10): qty 2

## 2022-01-16 MED ORDER — METOPROLOL TARTRATE 12.5 MG HALF TABLET
12.5000 mg | ORAL_TABLET | Freq: Two times a day (BID) | ORAL | Status: DC
  Administered 2022-01-16 – 2022-02-03 (×36): 12.5 mg via ORAL
  Filled 2022-01-16 (×37): qty 1

## 2022-01-16 MED ORDER — TRAVASOL 10 % IV SOLN
INTRAVENOUS | Status: AC
  Filled 2022-01-16: qty 1134

## 2022-01-16 MED ORDER — AMIODARONE HCL IN DEXTROSE 360-4.14 MG/200ML-% IV SOLN
60.0000 mg/h | INTRAVENOUS | Status: DC
  Administered 2022-01-16: 60 mg/h via INTRAVENOUS

## 2022-01-16 MED ORDER — METRONIDAZOLE 500 MG/100ML IV SOLN
500.0000 mg | Freq: Three times a day (TID) | INTRAVENOUS | Status: DC
  Administered 2022-01-16 – 2022-01-20 (×11): 500 mg via INTRAVENOUS
  Filled 2022-01-16 (×15): qty 100

## 2022-01-16 MED ORDER — AMIODARONE HCL IN DEXTROSE 360-4.14 MG/200ML-% IV SOLN
30.0000 mg/h | INTRAVENOUS | Status: DC
  Filled 2022-01-16: qty 200

## 2022-01-16 MED ORDER — HEPARIN SODIUM (PORCINE) 1000 UNIT/ML DIALYSIS
1000.0000 [IU] | INTRAMUSCULAR | Status: DC | PRN
  Administered 2022-01-16: 2800 [IU] via INTRAVENOUS_CENTRAL
  Filled 2022-01-16 (×2): qty 1

## 2022-01-16 MED ORDER — POTASSIUM CHLORIDE 10 MEQ/50ML IV SOLN
10.0000 meq | INTRAVENOUS | Status: AC
  Administered 2022-01-16 (×3): 10 meq via INTRAVENOUS
  Filled 2022-01-16 (×3): qty 50

## 2022-01-16 MED ORDER — SODIUM PHOSPHATES 45 MMOLE/15ML IV SOLN
45.0000 mmol | Freq: Once | INTRAVENOUS | Status: AC
  Administered 2022-01-16: 45 mmol via INTRAVENOUS
  Filled 2022-01-16: qty 15

## 2022-01-16 MED ORDER — SODIUM CHLORIDE 0.9 % IV SOLN
1.0000 g | Freq: Three times a day (TID) | INTRAVENOUS | Status: DC
  Administered 2022-01-16 – 2022-01-20 (×11): 1 g via INTRAVENOUS
  Filled 2022-01-16 (×14): qty 5

## 2022-01-16 MED ORDER — AMIODARONE HCL IN DEXTROSE 360-4.14 MG/200ML-% IV SOLN
30.0000 mg/h | INTRAVENOUS | Status: DC
  Administered 2022-01-16 – 2022-01-19 (×5): 30 mg/h via INTRAVENOUS
  Filled 2022-01-16 (×6): qty 200

## 2022-01-16 MED ORDER — INSULIN ASPART 100 UNIT/ML IJ SOLN
0.0000 [IU] | Freq: Four times a day (QID) | INTRAMUSCULAR | Status: DC
  Administered 2022-01-16: 2 [IU] via SUBCUTANEOUS

## 2022-01-16 MED ORDER — AMIODARONE LOAD VIA INFUSION
150.0000 mg | Freq: Once | INTRAVENOUS | Status: AC
  Administered 2022-01-16: 150 mg via INTRAVENOUS
  Filled 2022-01-16: qty 83.34

## 2022-01-16 MED ORDER — HYDROCODONE-ACETAMINOPHEN 5-325 MG PO TABS: 1.0000 | ORAL_TABLET | Freq: Four times a day (QID) | ORAL | Status: DC | PRN

## 2022-01-16 MED ORDER — AMIODARONE HCL IN DEXTROSE 360-4.14 MG/200ML-% IV SOLN
60.0000 mg/h | INTRAVENOUS | Status: AC
  Administered 2022-01-16 (×2): 60 mg/h via INTRAVENOUS

## 2022-01-16 MED ORDER — ALTEPLASE 2 MG IJ SOLR
2.0000 mg | Freq: Once | INTRAMUSCULAR | Status: DC | PRN
Start: 2022-01-16 — End: 2022-01-16

## 2022-01-16 NOTE — Progress Notes (Signed)
PHARMACY - TOTAL PARENTERAL NUTRITION CONSULT NOTE   Indication: Prolonged ileus  Patient Measurements: Height: 5\' 4"  (162.6 cm) Weight: 94.4 kg (208 lb 1.8 oz) IBW/kg (Calculated) : 54.7 TPN AdjBW (KG): 60.7 Body mass index is 35.72 kg/m.  Assessment: 75 yo female with septic shock, high-grade small bowel obstruction. Underwent surgery on 8/17 with ex lap and lysis of adhesions for SBO. Reported vomiting and diarrhea since 8/11 up until surgery on 8/17. Pharmacy consulted for TPN due to prolonged ileus.  Glucose / Insulin: CBG  <180, 0 units mSSI last 24 hours  Electrolytes: Na 134, K 3.3, Mag 2.1 (goal K >4, Mag >2), Phos 1.4, CoCa 9.5 Renal: AKI off CRRT Hepatic: AST/ALT/T.bili wnl, Alk Phos trending down to 128, Alb 1.8 Intake / Output; MIVF: HD 8/23, UOP unmeasured - urinated x2, NG output 123mL, LBM 8/22 GI Imaging: None GI Surgeries / Procedures:  8/17 with ex lap and lysis of adhesions for SBO.   Central access: Triple lumen CVC (placed 8/17) TPN start date: 01/14/22  Nutritional Goals: Goal TPN rate is 75 mL/hr (provides 115 g of protein and 1800 kcals per day - 64 gm/l of protein, 15.3% dextrose, and 25 gm/l lf lipid)  RD Assessment: Estimated Needs Total Energy Estimated Needs: 1700-1900 Total Protein Estimated Needs: 100-120 gm Total Fluid Estimated Needs: 1.7-1.9 L  Current Nutrition:  NPO  Plan:  Continue TPN at goal rate of 75 mL/hr at 1800- providing 100% of nutrition goals.  Electrolytes in TPN: Removed due to changing renal status: Na 73mEq/L, K 33mEq/L, Ca 31mEq/L, Mg 98mEq/L, and Phos 28mmol/L. Max acetate for now. Add standard MVI and trace elements to TPN -removed chromium since pt undergoing dialysis Change Moderate to q6h SSI and adjust as needed  Give NaPhos 47mmol IV x 1 and KCl 24mEq x3 IV outside of TPN per discussion with nephrologist after HD Monitor TPN labs on Mon/Thurs, and daily for next few days  Luisa Hart, PharmD, BCPS Clinical  Pharmacist 01/16/2022 9:49 AM   Please refer to [facility] for pharmacy phone number

## 2022-01-16 NOTE — Progress Notes (Signed)
Pt BP 98/72, Pt is resting, UF goal is set for 2 L if SBP is greater than 105, 100cc of saline given for BP support and UF turned off

## 2022-01-16 NOTE — Progress Notes (Signed)
Patient ID: [name], female   DOB: [birth year], 76 y.o.   MRN: [id number]   Acute Care Surgery Service Progress Note:    Chief Complaint/Subjective: In HD today, more somnolent today and moaning and just not acting quite as normal as yesterday per notes.  Monitor with multiple PVCs and alarming constantly.  Had a large liquid BM in the bed.  Thinks she did ok with clears yesterday but unsure at this point.  Objective: Vital signs in last 24 hours: Temp:  [98.1 F (36.7 C)-98.7 F (37.1 C)] 98.7 F (37.1 C) (08/23 0735) Pulse Rate:  [82-108] 101 (08/23 1000) Resp:  [13-27] 18 (08/23 1000) BP: (98-136)/(57-84) 112/68 (08/23 1000) SpO2:  [94 %-100 %] 95 % (08/23 1000) Last BM Date : 01/15/22  Intake/Output from previous day: 08/22 0701 - 08/23 0700 In: 2581.2 [I.V.:1936.6; IV Piggyback:644.5] Out: 100 [Emesis/NG output:100] Intake/Output this shift: No intake/output data recorded.  PE: Lungs: normal effort on [place] Cardiovascular: irregular Abd: soft, ND, honeycomb dressing c/d/I; overall minimally tender Extremities: BUE and BLE edema Neuro: alert, nonfocal, follows commands, but more somnolent currently  Lab Results: CBC  Recent Labs    01/15/22 0431 01/16/22 0627  WBC 18.8* 19.5*  HGB 8.4* 8.8*  HCT 26.8* 28.2*  PLT 185 202   BMET Recent Labs    01/15/22 0431 01/16/22 0627  NA 138 134*  K 3.3* 3.3*  CL 107 106  CO2 24 22  GLUCOSE 98 113*  BUN 28* 43*  CREATININE 2.09* 2.75*  CALCIUM 7.4* 7.7*   LFT    Latest Ref Rng & Units 01/16/2022    6:27 AM 01/15/2022    4:31 AM 01/14/2022    4:15 PM  Hepatic Function  Total Protein 6.5 - 8.1 g/dL 4.1  4.1    Albumin 3.5 - 5.0 g/dL 1.8  1.8  1.8   AST 15 - 41 U/L 25  34    ALT 0 - 44 U/L 17  20    Alk Phosphatase 38 - 126 U/L 128  155    Total Bilirubin 0.3 - 1.2 mg/dL 0.6  0.7     PT/INR No results for input(s): "LABPROT", "INR" in the last 72 hours. ABG No results for input(s): "PHART", "HCO3" in the last  72 hours.  Invalid input(s): "PCO2", "PO2"  Studies/Results:  Anti-infectives: Anti-infectives (From admission, onward)    Start     Dose/Rate Route Frequency Ordered Stop   01/15/22 0526  aztreonam (AZACTAM) 1 g in sodium chloride 0.9 % 100 mL IVPB  Status:  Discontinued       See Hyperspace for full Linked Orders Report.   1 g 200 mL/hr over 30 Minutes Intravenous Every 24 hours 01/14/22 1529 01/14/22 1534   01/15/22 0526  aztreonam (AZACTAM) 1 g in sodium chloride 0.9 % 100 mL IVPB       See Hyperspace for full Linked Orders Report.   1 g 200 mL/hr over 30 Minutes Intravenous Every 24 hours 01/14/22 1534 01/15/22 0504   01/13/22 2100  vancomycin (VANCOCIN) IVPB 1000 mg/200 mL premix  Status:  Discontinued        1,000 mg 200 mL/hr over 60 Minutes Intravenous Every 24 hours 01/12/22 2135 01/13/22 1027   01/13/22 0500  aztreonam (AZACTAM) 2 g in sodium chloride 0.9 % 100 mL IVPB  Status:  Discontinued       See Hyperspace for full Linked Orders Report.   2 g 200 mL/hr over 30 Minutes Intravenous Every  12 hours 01/12/22 2135 01/14/22 1529   01/12/22 2015  vancomycin (VANCOREADY) IVPB 1750 mg/350 mL        1,750 mg 175 mL/hr over 120 Minutes Intravenous  Once 01/12/22 1921 01/12/22 2259   01/11/22 1800  aztreonam (AZACTAM) 1 g in sodium chloride 0.9 % 100 mL IVPB  Status:  Discontinued       See Hyperspace for full Linked Orders Report.   1 g 200 mL/hr over 30 Minutes Intravenous Every 8 hours 01/11/22 0815 01/12/22 2135   01/11/22 1000  levofloxacin (LEVAQUIN) IVPB 500 mg  Status:  Discontinued        500 mg 100 mL/hr over 60 Minutes Intravenous Every 48 hours 01/10/22 1432 01/11/22 0744   01/11/22 0915  aztreonam (AZACTAM) 2 g in sodium chloride 0.9 % 100 mL IVPB       See Hyperspace for full Linked Orders Report.   2 g 200 mL/hr over 30 Minutes Intravenous  Once 01/11/22 0815 01/11/22 1045   01/11/22 0845  aztreonam (AZACTAM) 2 g in sodium chloride 0.9 % 100 mL IVPB  Status:   Discontinued        2 g 200 mL/hr over 30 Minutes Intravenous  Once 01/11/22 0747 01/11/22 0815   01/11/22 0845  metroNIDAZOLE (FLAGYL) IVPB 500 mg  Status:  Discontinued        500 mg 100 mL/hr over 60 Minutes Intravenous Every 12 hours 01/11/22 0747 01/15/22 0851   01/10/22 1900  levofloxacin (LEVAQUIN) IVPB 500 mg  Status:  Discontinued        500 mg 100 mL/hr over 60 Minutes Intravenous Every 24 hours 01/10/22 1426 01/10/22 1432   01/10/22 0700  ciprofloxacin (CIPRO) IVPB 400 mg        400 mg 200 mL/hr over 60 Minutes Intravenous On call to O.R. 01/10/22 0612 01/10/22 1052   01/10/22 0700  metroNIDAZOLE (FLAGYL) IVPB 500 mg  Status:  Discontinued        500 mg 100 mL/hr over 60 Minutes Intravenous On call to O.R. 01/10/22 0612 01/10/22 1123   01/08/22 0830  Levofloxacin (LEVAQUIN) IVPB 250 mg  Status:  Discontinued        250 mg 50 mL/hr over 60 Minutes Intravenous Every 24 hours 01/08/22 0724 01/08/22 0727   01/08/22 0830  Levofloxacin (LEVAQUIN) IVPB 250 mg  Status:  Discontinued        250 mg 50 mL/hr over 60 Minutes Intravenous Every 48 hours 01/08/22 0727 01/08/22 0732   01/08/22 0830  Levofloxacin (LEVAQUIN) IVPB 250 mg  Status:  Discontinued        250 mg 50 mL/hr over 60 Minutes Intravenous Every 24 hours 01/08/22 0732 01/10/22 1426       Medications: Scheduled Meds:  amiodarone  150 mg Intravenous Once   Chlorhexidine Gluconate Cloth  6 each Topical Daily   feeding supplement  1 Container Oral TID BM   insulin aspart  0-15 Units Subcutaneous Q6H   metoprolol tartrate  12.5 mg Oral BID   QUEtiapine  25 mg Oral QHS   sodium chloride flush  10-40 mL Intracatheter Q12H   sodium chloride flush  3 mL Intravenous Q12H   Continuous Infusions:  sodium chloride 250 mL (01/15/22 0747)   sodium chloride Stopped (01/15/22 1255)   sodium chloride     amiodarone     Followed by   amiodarone     heparin 1,550 Units/hr (01/16/22 0801)   potassium chloride     sodium  phosphate 45 mmol in dextrose 5 % 250 mL infusion     TPN ADULT (ION) 75 mL/hr at 01/15/22 2200   TPN ADULT (ION)     PRN Meds:.sodium chloride, Place/Maintain arterial line **AND** sodium chloride, albuterol, alteplase, alteplase, heparin, HYDROmorphone (DILAUDID) injection, lip balm, [DISCONTINUED] ondansetron **OR** ondansetron (ZOFRAN) IV, mouth rinse, sodium chloride, sodium chloride flush, sodium chloride flush  Assessment/Plan: POD#6 - status post ex lap with lysis of adhesions - Dr. Constance Haw ([facility]) - Afebrile WBC 19 - abdomen currently stable.  CXR today per medicine after discussion.  Abdomen with bowel function.  If continues to trend up tomorrow, may need to scan her.   -extubated 8/21 -SLP eval yesterday.  Had some CLD yesterday.  Will not advance today.  Not fully awake to give more today -continue full support TPN FEN: CLD, TPN ID: aztreonam 8/18 >>8/22 VTE: hep gtt Foley: purewick in place   Afib - IV hep, discussion at bedside with cardiology, medicine, and myself today.  Restarting some IV amio, but also oral metoprolol as well AKI - CRRT stopped 8/21, now with IHD ABL anemia - stable  Data reviewed - vitals x 24 hrs, nursing note x 24hrs, CCM note, renal note   LOS: 9 days   Henreitta Cea, PA-C General & Trauma Surgery [phone] [facility]

## 2022-01-16 NOTE — Progress Notes (Signed)
OT Cancellation Note  Patient Details Name: [name] MRN: [id number] DOB: Aug 12, 1945   Cancelled Treatment:    Reason Eval/Treat Not Completed: Patient at procedure or test/ unavailable (Pt currently receiving HD and unable to participate in OT evaluation. Will re-attempt OT evaluation when patient is available.)  Ailene Ravel, OTR/L,CBIS  Supplemental OT - [facility]  01/16/2022, 8:50 AM

## 2022-01-16 NOTE — Progress Notes (Signed)
Pt BP 117/73 after 100cc of saline given, UF turned back on, UF goal lowered to 1.5L

## 2022-01-16 NOTE — TOC Initial Note (Addendum)
Transition of Care [facility]) - Initial/Assessment Note    Patient Details  Name: [name] MRN: [id number] Date of Birth: 1946-01-02  Transition of [facility]) CM/SW Contact:    Milas Gain, [place] Phone Number: 01/16/2022, 1:00 PM  Clinical Narrative:       Update- CSW received callback from patients son Larkin Ina. CSW received consult for possible SNF placement at time of discharge. CSW spoke with patients son regarding PT recommendation of SNF placement at time of discharge. Patients son reports patient comes from home with daughter.Patients son reports concerns of patient returning back home with her daughter after rehab. CSW following to complete assessment with patient when patient better oriented.Patients son expressed understanding of PT recommendation and is agreeable to SNF placement for patient at time of discharge. Patients son gave CSW permission to fax out initial referral near the [place] area.CSW following to fax out initial referral closer to patient being medically ready for dc.Patient currently on TPN.CSW discussed insurance authorization process and will provide medicare compare ratings list with accepted SNF bed offers when available.Patients son reports patient has not received the COVID vaccines. No further questions reported at this time. CSW to continue to follow and assist with discharge planning needs.              CSW received consult for possible SNF placement at time of discharge. Due to patients current orientation CSW LVM for patients son Larkin Ina. CSW awaiting callback to discuss PT recommendation/dc plan for patient. CSW will continue to follow and assist with patients dc planning needs.    Expected Discharge Plan: Home/Self Care Barriers to Discharge: Continued Medical Work up   Patient Goals and CMS Choice        Expected Discharge Plan and Services Expected Discharge Plan: Home/Self Care                                               Prior Living Arrangements/Services                       Activities of Daily Living Home Assistive Devices/Equipment: Environmental consultant (specify type) ADL Screening (condition at time of admission) Patient's cognitive ability adequate to safely complete daily activities?: Yes Is the patient deaf or have difficulty hearing?: Yes Does the patient have difficulty seeing, even when wearing glasses/contacts?: No Does the patient have difficulty concentrating, remembering, or making decisions?: No Patient able to express need for assistance with ADLs?: Yes Does the patient have difficulty dressing or bathing?: No Independently performs ADLs?: Yes (appropriate for developmental age) Does the patient have difficulty walking or climbing stairs?: Yes Weakness of Legs: Both Weakness of Arms/Hands: None  Permission Sought/Granted                  Emotional Assessment              Admission diagnosis:  Partial small bowel obstruction ([place]) [K56.600] Patient Active Problem List   Diagnosis Date Noted   Malnutrition of moderate degree 01/14/2022   Respiratory failure requiring intubation ([place]) 01/11/2022    Class: Acute   Acute respiratory failure with hypoxia and hypercapnia ([place]) 01/10/2022   Septic shock ([place]) 01/10/2022   Pressure injury of skin 01/10/2022   AKI (acute kidney injury) ([place]) 01/09/2022   Small bowel obstruction ([place]) 01/07/2022   Abnormal weight loss 11/28/2021  Family history of malignant neoplasm of digestive organs 11/28/2021   Intestinal malabsorption 11/28/2021   Renal insufficiency 12/08/2020   Degeneration of lumbar intervertebral disc 01/07/2020   Venous (peripheral) insufficiency 09/30/2019   Anemia 08/12/2017   Vitamin D deficiency 08/12/2017   Lumbar spondylosis with myelopathy 06/03/2017   Primary osteoarthritis of both knees 03/19/2017   Anxiety 02/17/2017   History of pulmonary embolus (PE) 07/09/2016   COPD (chronic  obstructive pulmonary disease) ([place]) 01/18/2016   Hyperlipidemia 01/18/2016   Depression 01/18/2016   Essential hypertension 01/18/2016   H/O spinal fusion 01/18/2016   Hypothyroidism (acquired) 01/18/2016   Thoracic aorta atherosclerosis ([place]) 01/18/2016   Chronic cholecystitis with calculus 10/04/2013   PCP:  Chevis Pretty, FNP Pharmacy:   [place], [place] [address] Phone: [phone] Fax: [phone]  [id number] - [place], [address] [place] [address] Phone: [phone] Fax: [phone]  [id number] [place]Phone: [phone]: [phone]    Social Determinants of Health (SDOH) Interventions    Readmission Risk Interventions     No data to display

## 2022-01-16 NOTE — Progress Notes (Signed)
C/O of abdominal pain made by patient. Otherwise vitals stable. UF turned off to provide comfort. Will monitor for improvement.

## 2022-01-16 NOTE — Progress Notes (Addendum)
Received patient in bed to unit. Alert and oriented. Informed consent signed and in chart.   Treatment initiated: 0800 Treatment completed: 1048   Patient tolerated well. Treatment was terminated early per provider due to heart rhythm concern on unit monitor. EKG interpretation indicated A-fib. Transported back to the room Alert, without acute distress. Report given to patient's floor nurse.   Access used: Left HD Catheter Access issues: None   Total UF removed: 1L Medication(s) given: none Post HD VS: 116/63 103 93% 98.1 93% CBG- 106  Post HD weight:  unable to obtain.    [facility]

## 2022-01-16 NOTE — Progress Notes (Signed)
[place] for Heparin Indication: atrial fibrillation  Allergies  Allergen Reactions   Celebrex [Celecoxib] Swelling   Keflet [Cephalexin] Swelling   Penicillins Hives and Swelling    DID THE REACTION INVOLVE: Swelling of the face/tongue/throat, SOB, or low BP? Yes-swelling-hives Sudden or severe rash/hives, skin peeling, or the inside of the mouth or nose? Unknown Did it require medical treatment? Unknown When did it last happen?    over 10 years   If all above answers are "NO", may proceed with cephalosporin use.    Sulfa Antibiotics Swelling   Kenalog [Triamcinolone Acetonide]     unknown   Latex    Lisinopril Other (See Comments)    unknown   Mobic [Meloxicam]     SWELLING    Patient Measurements: Height: '5\' 4"'$  (162.6 cm) Weight: 94.4 kg (208 lb 1.8 oz) IBW/kg (Calculated) : 54.7 Heparin Dosing Weight: 71.5 kg  Vital Signs: BP: 126/73 (08/22 2100) Pulse Rate: 99 (08/22 2145)  Labs: Recent Labs    01/13/22 0448 01/13/22 1521 01/14/22 0400 01/14/22 1248 01/14/22 1615 01/15/22 0431 01/15/22 1251 01/15/22 2350  HGB 10.2*  --  9.4*  --   --  8.4*  --   --   HCT 31.9*  --  28.9*  --   --  26.8*  --   --   PLT 144*  --  145*  --   --  185  --   --   APTT 50*  --  197*  --   --   --   --   --   HEPARINUNFRC  --    < > 0.49   < >  --  0.21* <0.10* 0.15*  CREATININE 3.55*   < > 2.08*  --  1.64* 2.09*  --   --    < > = values in this interval not displayed.    Estimated Creatinine Clearance: 25.9 mL/min (A) (by C-G formula based on SCr of 2.09 mg/dL (H)).   Assessment: Pharmacy consulted to start heparin infusion for post-op afib. Patient was not on any AC PTA.    Heparin level below goal 0.15 on 1350 units/hr. No infusion issues or overt s/sx of bleeding per RN  Goal of Therapy:  Heparin level 0.3-0.7 units/ml Monitor platelets by anticoagulation protocol: Yes   Plan:  Increase heparin to 1550 units/hr Check a  heparin level in 8 hours  Continue daily heparin levels   Georga Bora, PharmD Clinical Pharmacist 01/16/2022 12:30 AM Please check AMION for all [place] numbers

## 2022-01-16 NOTE — Progress Notes (Signed)
OT Cancellation Note  Patient Details Name: [name] MRN: [id number] DOB: 1946-03-13   Cancelled Treatment:    Reason Eval/Treat Not Completed: Medical issues which prohibited therapy (Spoke with Nursing. Pt is not medically able to participate in OT evaluation today. Experienced A-fib during HD, V tach, and she is lethergic. Will re-attempt OT evaluation at a later date when patient is medically able to participate.)  Ailene Ravel, OTR/L,CBIS  Supplemental OT - [place] and [facility]  01/16/2022, 3:03 PM

## 2022-01-16 NOTE — Progress Notes (Signed)
NAME:  [name], MRN:  [id number], DOB:  01/08/1946, LOS: 9 ADMISSION DATE:  01/07/2022, CONSULTATION DATE:  8/17 REFERRING MD:  Leafy Half, [place], CHIEF COMPLAINT:  post op circulatory shock/ resp failure    History of Present Illness:  45 yowf never smoker with "COPD" (not supported by pfts)   hyperlipidemia, essential hypertension, hypothyroidism, pulmonary embolism in the past, osteoarthritis, and depression who presents from home with vomiting and diarrhea since 8/11 when  presented with  acute onset of abdominal pain CT scan was obtained and was unremarkable she received some IV fluids and antiemetics and was discharged home.  Her V and D persisted and when  previous treatment did not alleviate her symptoms   she presented to [facility].  CT scan was obtained and found to show a partial small bowel obstruction and significant changes compared to the CT scan she had on 8/11.   ED Course: Patient was given an oral dose of Zofran and some IV fluid drip.  She continued to have persistent nausea in the emergency department and vomited at least 200 mL  witnessed  She was given additional Zofran at that point in her IV.  Also had 1 episode of diarrhea in the emergency department which is described as small vol. Labs were consistent with acute kidney injury and hypokalemia CT scan showed a partial small bowel obstruction.     To OR 8/17 Exploratory laparotomy lysis of adhesion and remained on vent/ on pressors post op and PCCM service consulted.   Worsening renal failure despite volume resuscitation and ongoing pressors and MV requirements. Transferred to [facility] for CRRT.     Significant Hospital Events: Including procedures, antibiotic start and stop dates in addition to other pertinent events   8/14 admitted; bowel obstruction managed medically 8/15 renal function worsening 8/17  Went into shock> emergent exploratory laparotomy w/ lysis of adhesion, intubated, RIJ CVC placed in OR 8/18 worsening renal  function 8/19 arrived at [facility], starting CRRT 8/21 extubated, TNA started, CRRT discontinued 8/22 transferred to floor.  Interim History / Subjective:  More sleepy post dialysis today. Increased PVC's Still on amiodarone.   Objective   Blood pressure (!) 116/53, pulse 93, temperature 98.1 F (36.7 C), temperature source Oral, resp. rate 19, height '5\' 4"'$  (1.626 m), weight 94.4 kg, SpO2 95 %.        Intake/Output Summary (Last 24 hours) at 01/16/2022 1450 Last data filed at 01/16/2022 1048 Gross per 24 hour  Intake 1365.33 ml  Output 1 ml  Net 1364.33 ml    Filed Weights   01/13/22 1236 01/14/22 0500 01/15/22 0500  Weight: 89.8 kg 90.4 kg 94.4 kg        Examination: General: critically ill appearing woman lying in bed in NAD HEENT: [place]/AT, eyes anicteric, Cardio: S1S2, RRR Resp: mild thick tan secretions, Chest clear Abd: soft, ND, hypoactive bowel sounds. NGT out Extremities: no cyanosis or clubbing. Minimal edema. Derm: pallor, no diffuse rashes Neuro: Sleepy, will wake up and follow commands.   Assessment & Plan:  Sleepiness following HD Likely some degree of dialysis dysequilibrium. This was her first HD session.  - Observation only. - Consider stopping seroquel if persists.   Sepsis s/p lap for SBO Resolved  Afib with RVR -On amiodarone - Cardiology following.   AKI - likely ATN from sepsis and prolonged pre-renal state. ARB prior to admit UOP improved. CRRT discontinued 8/21. -IHD per nephrology.  Moderate protein energy malnutrition -start TPN  Chronic anemia -transfuse for  Hb <7 or hemodynamically significant bleeding  Best Practice (right click and "Reselect all SmartList Selections" daily)  GI: PPI DVT: heparin Nutrition: TPN Lines & tubes: CVC, HD Family discussions: pending  Kipp Brood, MD [facility] ICU Physician [place]  Pager: [phone] Or Epic Secure Chat After hours: [phone].  01/16/2022, 2:55 PM

## 2022-01-16 NOTE — Progress Notes (Signed)
Speech Language Pathology Treatment: Dysphagia  Patient Details Name: [name] MRN: [id number] DOB: [birth year] Today's Date: 01/16/2022 Time: 7829-5621 SLP Time Calculation (min) (ACUTE ONLY): 20 min  Assessment / Plan / Recommendation Clinical Impression  Patient seen by SLP for skilled treatment session focused on dysphagia goals. Patient had eyes closed but awakened without difficulty. She told SLP "it was a rough day". She certainly appeared fatigued but in terms of cognitive function, she appeared more lucid and was not acting nearly as anxious/worried as she had previous date. SpO2 and RR both WFL. She was receptive to drinking some straw sips of water and although she did exhibit mild amount of coughing, this was observed prior to PO's and intensity and frequency did not change following PO intake. Voice was clear but low in intensity and no change observed after PO's. Patient was asking some questions about POC such as how long she would be here, etc. SLP reviewed patient's chart and recent MD notes indicate concern that she was more lethargic today (had dialysis) and CT head and Chest  xray both ordered. SLP will continue to follow patient for ability to advance with diet consistencie and PO toleration.   HPI HPI: Patient is a 76 y.o. female with PMH: COPD, HLD, essential HTN, hypothyroidism, PE, osteoarthritis, depression, GERD. She presented initially to urgent care on 01/04/22 with acute onset abdominal pain, had CT which was unremarkable and she received IV fluids and antiemetics and was discharged hom. She presented to the hospital from home on 01/07/22 with vomiting and diarrhea since 01/04/22. In ED CT scan showed partial SBO. Surgery consulted and NG ordered. 8/15, had worsening renal function; 8/17 she went into shock and required emergent Exploratory laparotomy performed in OR and patient remained on vent on pressors post op. She arrived at [facility] on 8/19 and CRRT started; 8/21 she was  extubated, TNA started and CRRT discontinued.      SLP Plan  Continue with current plan of care      Recommendations for follow up therapy are one component of a multi-disciplinary discharge planning process, led by the attending physician.  Recommendations may be updated based on patient status, additional functional criteria and insurance authorization.    Recommendations  Diet recommendations: Thin liquid Medication Administration: Other (Comment) Supervision: Full supervision/cueing for compensatory strategies;Staff to assist with self feeding Compensations: Small sips/bites;Slow rate Postural Changes and/or Swallow Maneuvers: Seated upright 90 degrees                Oral Care Recommendations: Staff/trained caregiver to provide oral care;Oral care BID;Oral care before and after PO Assistance recommended at discharge: Frequent or constant Supervision/Assistance SLP Visit Diagnosis: Dysphagia, unspecified (R13.10) Plan: Continue with current plan of care           Sonia Baller, MA, CCC-SLP Speech Therapy

## 2022-01-16 NOTE — Progress Notes (Addendum)
Progress Note  Patient Name: [name] Date of Encounter: 01/16/2022  Primary Cardiologist: Carlyle Dolly, MD   Subjective   Overnight patient has been transferred out of unit. Patient notes that she feels miserable.  She can't move.  She wants to be able to move.  She does not want to be on dialysis daily.  No CP, SOB, Palpitations.  Inpatient Medications    Scheduled Meds:  amiodarone  400 mg Oral BID   Chlorhexidine Gluconate Cloth  6 each Topical Daily   feeding supplement  1 Container Oral TID BM   insulin aspart  0-15 Units Subcutaneous Q4H   QUEtiapine  25 mg Oral QHS   sodium chloride flush  10-40 mL Intracatheter Q12H   sodium chloride flush  3 mL Intravenous Q12H   Continuous Infusions:  sodium chloride 250 mL (01/15/22 0747)   sodium chloride Stopped (01/15/22 1255)   sodium chloride     heparin 1,550 Units/hr (01/16/22 0801)   TPN ADULT (ION) 75 mL/hr at 01/15/22 2200   PRN Meds: sodium chloride, Place/Maintain arterial line **AND** sodium chloride, albuterol, alteplase, alteplase, heparin, HYDROmorphone (DILAUDID) injection, lip balm, [DISCONTINUED] ondansetron **OR** ondansetron (ZOFRAN) IV, mouth rinse, sodium chloride, sodium chloride flush, sodium chloride flush   Vital Signs    Vitals:   01/16/22 0405 01/16/22 0505 01/16/22 0735 01/16/22 0800  BP: 111/62 114/65 136/73 134/84  Pulse:   96 93  Resp:  '17 17 17  '$ Temp:   98.7 F (37.1 C)   TempSrc:   Axillary   SpO2:  97% 96% 97%  Weight:      Height:        Intake/Output Summary (Last 24 hours) at 01/16/2022 0829 Last data filed at 01/16/2022 0600 Gross per 24 hour  Intake 2063.19 ml  Output 50 ml  Net 2013.19 ml   Filed Weights   01/13/22 1236 01/14/22 0500 01/15/22 0500  Weight: 89.8 kg 90.4 kg 94.4 kg    Telemetry    A fib with PVCS, rate controlled except when agitated - Personally Reviewed   Physical Exam   Gen: Mild distress BMI 35 Neck: No JVD Cardiac: No Rubs or  Gallops, no Murmur, RRR  Seen on HD Respiratory: Clear to auscultation bilaterally, decreased effort, normal  respiratory rate GI: Soft, non distended MS: bilateral edema; Psych: Depressed mood and affect   Labs    Chemistry Recent Labs  Lab 01/14/22 0400 01/14/22 1615 01/15/22 0431 01/16/22 0627  NA 138 136 138 134*  K 3.7 3.6 3.3* 3.3*  CL 109 107 107 106  CO2 20* '24 24 22  '$ GLUCOSE 158* 125* 98 113*  BUN 31* 23 28* 43*  CREATININE 2.08* 1.64* 2.09* 2.75*  CALCIUM 7.4* 7.3* 7.4* 7.7*  PROT 4.5*  --  4.1* 4.1*  ALBUMIN 1.9* 1.8* 1.8* 1.8*  AST 20  --  34 25  ALT 16  --  20 17  ALKPHOS 84  --  155* 128*  BILITOT 0.7  --  0.7 0.6  GFRNONAA 24* 32* 24* 17*  ANIONGAP '9 5 7 6     '$ Hematology Recent Labs  Lab 01/14/22 0400 01/15/22 0431 01/16/22 0627  WBC 13.0* 18.8* 19.5*  RBC 3.37* 3.01* 3.14*  HGB 9.4* 8.4* 8.8*  HCT 28.9* 26.8* 28.2*  MCV 85.8 89.0 89.8  MCH 27.9 27.9 28.0  MCHC 32.5 31.3 31.2  RDW 18.0* 18.6* 18.9*  PLT 145* 185 202    Cardiac EnzymesNo results for input(s): "TROPONINI"  in the last 168 hours. No results for input(s): "TROPIPOC" in the last 168 hours.   BNPNo results for input(s): "BNP", "PROBNP" in the last 168 hours.   DDimer No results for input(s): "DDIMER" in the last 168 hours.   Radiology    ECHOCARDIOGRAM COMPLETE  Result Date: 01/14/2022    ECHOCARDIOGRAM REPORT   Patient Name:   [name] Date of Exam: 01/14/2022 Medical Rec #:  [id number]    Height:       64.0 in Accession #:    [id number]   Weight:       199.3 lb Date of Birth:  27-Aug-1945     BSA:          1.953 m Patient Age:    76 years     BP:           121/65 mmHg Patient Gender: F            HR:           98 bpm. Exam Location:  Inpatient Procedure: 2D Echo, Cardiac Doppler and Color Doppler Indications:    Ectopy  History:        Patient has prior history of Echocardiogram examinations, most                 recent 10/22/2017. COPD; Risk Factors:Hypertension and                  Dyslipidemia.  Sonographer:    Jefferey Pica Referring Phys: [id number] [place]  1. Left ventricular ejection fraction, by estimation, is 60 to 65%. The left ventricle has normal function. The left ventricle has no regional wall motion abnormalities. Left ventricular diastolic parameters were normal.  2. Right ventricular systolic function is normal. The right ventricular size is normal. There is normal pulmonary artery systolic pressure.  3. Left atrial size was mildly dilated.  4. Right atrial size was mildly dilated.  5. Calcified subchordal apparatus. The mitral valve is abnormal. Trivial mitral valve regurgitation. No evidence of mitral stenosis.  6. The aortic valve is tricuspid. There is mild calcification of the aortic valve. Aortic valve regurgitation is not visualized. Aortic valve sclerosis is present, with no evidence of aortic valve stenosis.  7. The inferior vena cava is normal in size with greater than 50% respiratory variability, suggesting right atrial pressure of 3 mmHg. FINDINGS  Left Ventricle: Left ventricular ejection fraction, by estimation, is 60 to 65%. The left ventricle has normal function. The left ventricle has no regional wall motion abnormalities. The left ventricular internal cavity size was normal in size. There is  no left ventricular hypertrophy. Left ventricular diastolic parameters were normal. Right Ventricle: The right ventricular size is normal. No increase in right ventricular wall thickness. Right ventricular systolic function is normal. There is normal pulmonary artery systolic pressure. The tricuspid regurgitant velocity is 2.61 m/s, and  with an assumed right atrial pressure of 5 mmHg, the estimated right ventricular systolic pressure is 38.4 mmHg. Left Atrium: Left atrial size was mildly dilated. Right Atrium: Right atrial size was mildly dilated. Pericardium: There is no evidence of pericardial effusion. Mitral Valve: Calcified subchordal apparatus.  The mitral valve is abnormal. There is mild thickening of the mitral valve leaflet(s). There is mild calcification of the mitral valve leaflet(s). Trivial mitral valve regurgitation. No evidence of mitral valve stenosis. Tricuspid Valve: The tricuspid valve is normal in structure. Tricuspid valve regurgitation is mild . No evidence of tricuspid stenosis. Aortic  Valve: The aortic valve is tricuspid. There is mild calcification of the aortic valve. Aortic valve regurgitation is not visualized. Aortic valve sclerosis is present, with no evidence of aortic valve stenosis. Aortic valve peak gradient measures 15.9 mmHg. Pulmonic Valve: The pulmonic valve was normal in structure. Pulmonic valve regurgitation is not visualized. No evidence of pulmonic stenosis. Aorta: The aortic root is normal in size and structure. Venous: The inferior vena cava is normal in size with greater than 50% respiratory variability, suggesting right atrial pressure of 3 mmHg. IAS/Shunts: No atrial level shunt detected by color flow Doppler.  LEFT VENTRICLE PLAX 2D LVIDd:         4.40 cm LVIDs:         3.00 cm LV PW:         0.90 cm LV IVS:        0.90 cm LVOT diam:     1.80 cm LV SV:         65 LV SV Index:   33 LVOT Area:     2.54 cm  RIGHT VENTRICLE             IVC RV S prime:     14.90 cm/s  IVC diam: 2.05 cm LEFT ATRIUM             Index        RIGHT ATRIUM           Index LA diam:        3.90 cm 2.00 cm/m   RA Area:     16.40 cm LA Vol (A2C):   62.6 ml 32.05 ml/m  RA Volume:   49.60 ml  25.39 ml/m LA Vol (A4C):   44.9 ml 22.99 ml/m LA Biplane Vol: 53.5 ml 27.39 ml/m  AORTIC VALVE                 PULMONIC VALVE AV Area (Vmax): 1.98 cm     PV Vmax:       1.23 m/s AV Vmax:        199.50 cm/s  PV Peak grad:  6.1 mmHg AV Peak Grad:   15.9 mmHg LVOT Vmax:      155.00 cm/s LVOT Vmean:     97.300 cm/s LVOT VTI:       0.257 m  AORTA Ao Root diam: 2.80 cm Ao Asc diam:  3.10 cm TRICUSPID VALVE TR Peak grad:   27.2 mmHg TR Vmax:        261.00  cm/s  SHUNTS Systemic VTI:  0.26 m Systemic Diam: 1.80 cm Jenkins Rouge MD Electronically signed by Jenkins Rouge MD Signature Date/Time: 01/14/2022/3:20:12 PM    Final      Patient Profile     76 y.o. female with new AF s/p septic shock  Assessment & Plan     New onset atrial fibrillation  Frequent PVCs  Hypokalemia Chronic LE Edema complicated by protein calorie malnutrition - CHADS-VASc 6 (CHF, HTN, diabetes, age x2, gender) - PO amiodarone 400 mg BID for one week, followed by 200 mg BID  - K bath and iHD per nephrology; appreciate recs. K goal of 4, mag goal of 2 - Hgb has stabilized, would transition to [place] if OK with surgery - BP has recovered; we will start metoprolol 12.5 mg PO BID and titrate as tolerated   Depressed mood and affect - discussed patients goal of getting better and moving more at length; that iHD may be required to pursue  this goal - Spiritual Care vs Palliative Care for symptom burden may be reasonable      For questions or updates, please contact [facility] Please consult [email or web address] for contact info under Cardiology/STEMI.      Rudean Haskell, MD [place] [place], [place] [address] [phone]  8:29 AM   Emergent call for Sustain Ventricular Tachycardia with HD. Patient is more somnolent. Per surgery, had more GI output. EKG is performed: She appears to have abberantly conducted AF with PVCs VTACH on monitor is ~ 100 bpm; abberancy conducted AF favored over very slow VT. Concern with her somnolence she will not tolerate PO Nephrology is aware: K and Mg for PM; bath may need adjusted Will switch to IV amiodarone may not tolerate PO right not. Will not change to DOAC today  CRITICAL CARE Performed by: Ashantae Pangallo A [place] Hijazi  Total critical care time: 30 minutes. Critical care time was exclusive of separately billable procedures and treating other patients.  Critical care was necessary to treat or prevent imminent or life-threatening deterioration. Critical care was time spent personally by me on the following activities: development of treatment plan with patient and/or surrogate as well as nursing, discussions with consultants, evaluation of patient's response to treatment, examination of patient, obtaining history from patient or surrogate, ordering and performing treatments and interventions, ordering and review of laboratory studies, ordering and review of radiographic studies, pulse oximetry and re-evaluation of patient's condition.    Signed, Rudean Haskell, [place]  01/16/2022 11:02 AM

## 2022-01-16 NOTE — Procedures (Signed)
I was present at this dialysis session. I have reviewed the session itself and made appropriate changes.   Seen on HD.  Using temp HD cath.  K 3.3 on 3K bath (we now have 4K so will put her on that).  Hypophosphatemic, no P in TPN, repleted today.    Hb stable 8.8.   Filed Weights   01/13/22 1236 01/14/22 0500 01/15/22 0500  Weight: 89.8 kg 90.4 kg 94.4 kg    Recent Labs  Lab 01/16/22 0627  NA 134*  K 3.3*  CL 106  CO2 22  GLUCOSE 113*  BUN 43*  CREATININE 2.75*  CALCIUM 7.7*  PHOS 1.4*    Recent Labs  Lab 01/11/22 0356 01/12/22 0344 01/13/22 0448 01/14/22 0400 01/15/22 0431 01/16/22 0627  WBC 23.0* 15.9* 14.7* 13.0* 18.8* 19.5*  NEUTROABS 18.1* 11.2* 12.1*  --   --   --   HGB 10.2* 8.8* 10.2* 9.4* 8.4* 8.8*  HCT 31.8* 27.7* 31.9* 28.9* 26.8* 28.2*  MCV 89.3 87.7 87.6 85.8 89.0 89.8  PLT 164 137* 144* 145* 185 202    Scheduled Meds:  amiodarone  400 mg Oral BID   Chlorhexidine Gluconate Cloth  6 each Topical Daily   feeding supplement  1 Container Oral TID BM   insulin aspart  0-15 Units Subcutaneous Q4H   metoprolol tartrate  12.5 mg Oral BID   QUEtiapine  25 mg Oral QHS   sodium chloride flush  10-40 mL Intracatheter Q12H   sodium chloride flush  3 mL Intravenous Q12H   Continuous Infusions:  sodium chloride 250 mL (01/15/22 0747)   sodium chloride Stopped (01/15/22 1255)   sodium chloride     heparin 1,550 Units/hr (01/16/22 0801)   TPN ADULT (ION) 75 mL/hr at 01/15/22 2200   PRN Meds:.sodium chloride, Place/Maintain arterial line **AND** sodium chloride, albuterol, alteplase, alteplase, heparin, HYDROmorphone (DILAUDID) injection, lip balm, [DISCONTINUED] ondansetron **OR** ondansetron (ZOFRAN) IV, mouth rinse, sodium chloride, sodium chloride flush, sodium chloride flush   Pearson Grippe  MD 01/16/2022, 10:10 AM

## 2022-01-16 NOTE — Progress Notes (Signed)
[name] for Heparin Indication: atrial fibrillation  Allergies  Allergen Reactions   Celebrex [Celecoxib] Swelling   Keflet [Cephalexin] Swelling   Penicillins Hives and Swelling    DID THE REACTION INVOLVE: Swelling of the face/tongue/throat, SOB, or low BP? Yes-swelling-hives Sudden or severe rash/hives, skin peeling, or the inside of the mouth or nose? Unknown Did it require medical treatment? Unknown When did it last happen?    over 10 years   If all above answers are "NO", may proceed with cephalosporin use.    Sulfa Antibiotics Swelling   Kenalog [Triamcinolone Acetonide]     unknown   Latex    Lisinopril Other (See Comments)    unknown   Mobic [Meloxicam]     SWELLING    Patient Measurements: Height: '5\' 4"'$  (162.6 cm) Weight:  (unable to obtain) IBW/kg (Calculated) : 54.7 Heparin Dosing Weight: 71.5 kg  Vital Signs: Temp: 98.1 F (36.7 C) (08/23 1048) Temp Source: Axillary (08/23 0735) BP: 115/60 (08/23 1120) Pulse Rate: 96 (08/23 1120)  Labs: Recent Labs    01/14/22 0400 01/14/22 1248 01/14/22 1615 01/15/22 0431 01/15/22 1251 01/15/22 2350 01/16/22 0627 01/16/22 0858  HGB 9.4*  --   --  8.4*  --   --  8.8*  --   HCT 28.9*  --   --  26.8*  --   --  28.2*  --   PLT 145*  --   --  185  --   --  202  --   APTT 197*  --   --   --   --   --   --   --   HEPARINUNFRC 0.49   < >  --  0.21* <0.10* 0.15*  --  0.29*  CREATININE 2.08*  --  1.64* 2.09*  --   --  2.75*  --    < > = values in this interval not displayed.    Estimated Creatinine Clearance: 19.7 mL/min (A) (by C-G formula based on SCr of 2.75 mg/dL (H)).   Assessment: Pharmacy consulted to start heparin infusion for post-op afib. Patient was not on any AC PTA.    Heparin level below goal 0.29 on 1550 units/hr. CBC stable  Goal of Therapy:  Heparin level 0.3-0.7 units/ml Monitor platelets by anticoagulation protocol: Yes   Plan:  Increase heparin to  1650 units/hr Check a heparin level in 8 hours  Continue daily heparin levels   Hildred Laser, PharmD Clinical Pharmacist **Pharmacist phone directory can now be found on [email or web address] ([email or web address]).  Listed under [place].

## 2022-01-16 NOTE — Progress Notes (Signed)
PROGRESS NOTE    [name]  OTL:[id number] DOB: May 13, 1946 DOA: 01/07/2022 PCP: Chevis Pretty, FNP   Brief Narrative: 76 year old with past medical history hyperlipidemia, hypertension, hypothyroidism, PE in the past, osteoarthritis, depression who presents from home with nausea vomiting and diarrhea since 8/11.  CT scan was obtained and was unremarkable.  She received IV fluids and antiemetic and was discharged home.  Her vomiting and diarrhea persisted, she presented to [facility], CT scan repeated found to have partial small bowel obstruction and significant changes compared to CT done on 8/11. Admitted 80/14 for bowel obstruction and was managed medically.  She developed acute renal failure, developed shock and underwent emergent exploratory laparotomy with lysis of adhesion on 8/17.  Intubated for Sx.  She was started on CRRT.  She was transferred to [facility] 8/19.  She was subsequently extubated 8/21st, started on TNA, CRRT discontinued.  She was started on hemodialysis first section 8/23. During hospitalization she also developed A-fib with RVR, started on amiodarone, cardiology consulted.  In hemodialysis today she was noted to be more lethargic, she was noted to have aberrantly conducted A-fib with PVCs, versus slow rate  V. Tach.  IV amiodarone was resumed.  She appears to be more sleepy today, CT head ordered.  ABG consistent with hypoxemia.  CCM has been consulted again.   Assessment & Plan:   Principal Problem:   Septic shock ([place]) Active Problems:   Respiratory failure requiring intubation ([place])   Small bowel obstruction (HCC)   COPD (chronic obstructive pulmonary disease) (HCC)   Hyperlipidemia   Hypothyroidism (acquired)   Essential hypertension   History of pulmonary embolus (PE)   AKI (acute kidney injury) ([place])   Acute respiratory failure with hypoxia and hypercapnia (HCC)   Pressure injury of skin   Malnutrition of moderate degree   1-SBO status post  laparotomy 8/17 NG tube has been removed.  Patient started on TPN 80/21 Pleated course of aztreonam with metronidazole Surgery following 2 watery large bowel movement today.  2-Acute metabolic encephalopathy: She appears to be more lethargic today ABG with hypoxia--- placed on oxygen CCM to evaluate Chest x-ray order Get CT head.   3-A-fib with RVR, PVC Versus slow rate  V. tach IV amiodarone resume Electrolytes replace with hemodialysis today Plan to repeat pain meds, magnesium and phosphorus this afternoon  4-Acute hypoxic respiratory failure; ABG Po2 58 Placed on oxygen.. CCM to evaluate, question transfer to ICU. Further  eval.  Chest x ray ordered.    AKI likely from ATN from sepsis Treated with CRRT and discontinue 80/21 Urology following and initiating intermittent hemodialysis  5-Moderate protein malnutrition: Continue with TPN  Chronic anemia: Monitor hemoglobin  Hypokalemia, hypophosphatemia,: Replaced with hemodialysis.  Repeat lab work this afternoon  Leukocytosis: Check chest x-ray  See wound care documentation below  Pressure Injury 01/10/22 Coccyx Medial;Mid Stage 2 -  Partial thickness loss of dermis presenting as a shallow open injury with a red, pink wound bed without slough. pink (Active)  01/10/22 1200  Location: Coccyx  Location Orientation: Medial;Mid  Staging: Stage 2 -  Partial thickness loss of dermis presenting as a shallow open injury with a red, pink wound bed without slough.  Wound Description (Comments): pink  Present on Admission:   Dressing Type None 01/15/22 2210     Pressure Injury 01/12/22 Face Left Stage 2 -  Partial thickness loss of dermis presenting as a shallow open injury with a red, pink wound bed without slough. red ulceration beneath  ETT holder on L cheek (Active)  01/12/22 1815  Location: Face  Location Orientation: Left  Staging: Stage 2 -  Partial thickness loss of dermis presenting as a shallow open injury with a  red, pink wound bed without slough.  Wound Description (Comments): red ulceration beneath ETT holder on L cheek  Present on Admission: Yes  Dressing Type Hydrocolloid 01/15/22 2210     Nutrition Problem: Moderate Malnutrition Etiology: acute illness (SBO)    Signs/Symptoms: energy intake < 75% for > 7 days, mild muscle depletion, moderate fat depletion    Interventions: TPN  Estimated body mass index is 35.72 kg/m as calculated from the following:   Height as of this encounter: '5\' 4"'$  (1.626 m).   Weight as of this encounter: 94.4 kg.   DVT prophylaxis: Heparin drip Code Status: Full code Family Communication: Son updated over the phone Disposition Plan:  Status is: Inpatient Remains inpatient appropriate because: Remain for management of SBO, status postsurgery, A-fib, renal failure    Consultants:  Nephrology CCM Surgery  Procedures:    Antimicrobials:    Subjective: She is sleepy, open eyes to voice, fall back to sleep.  She told me her name.    Objective: Vitals:   01/16/22 0917 01/16/22 0921 01/16/22 0930 01/16/22 1000  BP: 98/72 117/73 108/67 112/68  Pulse: 86 95 95 (!) 101  Resp: '18 19 19 18  '$ Temp:      TempSrc:      SpO2: 95% 96% 94% 95%  Weight:      Height:        Intake/Output Summary (Last 24 hours) at 01/16/2022 1058 Last data filed at 01/16/2022 0600 Gross per 24 hour  Intake 1651.72 ml  Output 0 ml  Net 1651.72 ml   Filed Weights   01/13/22 1236 01/14/22 0500 01/15/22 0500  Weight: 89.8 kg 90.4 kg 94.4 kg    Examination:  General exam: Appears calm and comfortable  Respiratory system: BL crackles. . Cardiovascular system: S1 & S2 heard, RRR.  Gastrointestinal system: Abdomen is nondistended, soft and nontender.  Central nervous system: lethargic Extremities: plus 2 edema \  Data Reviewed: I have personally reviewed following labs and imaging studies  CBC: Recent Labs  Lab 01/11/22 0356 01/12/22 0344 01/13/22 0448  01/14/22 0400 01/15/22 0431 01/16/22 0627  WBC 23.0* 15.9* 14.7* 13.0* 18.8* 19.5*  NEUTROABS 18.1* 11.2* 12.1*  --   --   --   HGB 10.2* 8.8* 10.2* 9.4* 8.4* 8.8*  HCT 31.8* 27.7* 31.9* 28.9* 26.8* 28.2*  MCV 89.3 87.7 87.6 85.8 89.0 89.8  PLT 164 137* 144* 145* 185 182   Basic Metabolic Panel: Recent Labs  Lab 01/12/22 1423 01/12/22 2123 01/13/22 0448 01/13/22 1521 01/14/22 0400 01/14/22 1615 01/15/22 0431 01/16/22 0627  NA 134*   < > 137 137 138 136 138 134*  K 3.5   < > 3.5 3.9 3.7 3.6 3.3* 3.3*  CL 108   < > 110 108 109 107 107 106  CO2 18*   < > 16* 20* 20* '24 24 22  '$ GLUCOSE 111*   < > 124* 157* 158* 125* 98 113*  BUN 93*   < > 61* 43* 31* 23 28* 43*  CREATININE 5.43*   < > 3.55* 2.56* 2.08* 1.64* 2.09* 2.75*  CALCIUM 7.3*   < > 7.7* 7.5* 7.4* 7.3* 7.4* 7.7*  MG 2.5*  --  2.3  --  2.4  --  2.1 2.1  PHOS 3.4   < >  2.6 2.3* 1.9* 1.6* 2.0* 1.4*   < > = values in this interval not displayed.   GFR: Estimated Creatinine Clearance: 19.7 mL/min (A) (by C-G formula based on SCr of 2.75 mg/dL (H)). Liver Function Tests: Recent Labs  Lab 01/12/22 0344 01/12/22 1423 01/12/22 2123 01/13/22 1521 01/14/22 0400 01/14/22 1615 01/15/22 0431 01/16/22 0627  AST 32 27  --   --  20  --  34 25  ALT 14 15  --   --  16  --  20 17  ALKPHOS 57 61  --   --  84  --  155* 128*  BILITOT 1.2 0.9  --   --  0.7  --  0.7 0.6  PROT 4.2* 4.4*  --   --  4.5*  --  4.1* 4.1*  ALBUMIN 2.0* 2.0*   < > 1.9* 1.9* 1.8* 1.8* 1.8*   < > = values in this interval not displayed.   No results for input(s): "LIPASE", "AMYLASE" in the last 168 hours. No results for input(s): "AMMONIA" in the last 168 hours. Coagulation Profile: No results for input(s): "INR", "PROTIME" in the last 168 hours. Cardiac Enzymes: No results for input(s): "CKTOTAL", "CKMB", "CKMBINDEX", "TROPONINI" in the last 168 hours. BNP (last 3 results) No results for input(s): "PROBNP" in the last 8760 hours. HbA1C: No results for  input(s): "HGBA1C" in the last 72 hours. CBG: Recent Labs  Lab 01/15/22 1250 01/15/22 1533 01/15/22 2001 01/16/22 0033 01/16/22 0504  GLUCAP 92 102* 101* 97 102*   Lipid Profile: Recent Labs    01/14/22 0400  TRIG 42   Thyroid Function Tests: No results for input(s): "TSH", "T4TOTAL", "FREET4", "T3FREE", "THYROIDAB" in the last 72 hours. Anemia Panel: No results for input(s): "VITAMINB12", "FOLATE", "FERRITIN", "TIBC", "IRON", "RETICCTPCT" in the last 72 hours. Sepsis Labs: Recent Labs  Lab 01/10/22 1255 01/10/22 1535 01/11/22 0806 01/11/22 0948  LATICACIDVEN 2.0* 1.0 1.2 1.1    Recent Results (from the past 240 hour(s))  Urine Culture     Status: Abnormal   Collection Time: 01/07/22  2:55 PM   Specimen: Urine, Clean Catch  Result Value Ref Range Status   Specimen Description   Final    URINE, CLEAN CATCH Performed at [facility], [address], [place], [place] [address]    Special Requests   Final    NONE Performed at [facility], [address], [place], [place] [address]    Culture (A)  Final    >=100,000 COLONIES/mL MULTIPLE SPECIES PRESENT, SUGGEST RECOLLECTION   Report Status 01/09/2022 FINAL  Final  Surgical pcr screen     Status: None   Collection Time: 01/10/22  6:20 AM   Specimen: Nasal Mucosa; Nasal Swab  Result Value Ref Range Status   MRSA, PCR NEGATIVE NEGATIVE Final   Staphylococcus aureus NEGATIVE NEGATIVE Final    Comment: (NOTE) The Xpert SA Assay (FDA approved for NASAL specimens in patients 50 years of age and older), is one component of a comprehensive surveillance program. It is not intended to diagnose infection nor to guide or monitor treatment. Performed at [facility], [address], [place], [place] [address]   Culture, blood (Routine X 2) w Reflex to ID Panel     Status: None   Collection Time: 01/10/22 12:55 PM   Specimen: BLOOD  Result Value Ref Range Status   Specimen Description BLOOD LEFT ANTECUBITAL  Final   Special  Requests   Final    BOTTLES DRAWN AEROBIC  AND ANAEROBIC Blood Culture results may not be optimal due to an excessive volume of blood received in culture bottles   Culture   Final    NO GROWTH 5 DAYS Performed at [facility], [address] [place], [place] [address]    Report Status 01/15/2022 FINAL  Final  Culture, blood (Routine X 2) w Reflex to ID Panel     Status: None   Collection Time: 01/10/22 12:55 PM   Specimen: BLOOD  Result Value Ref Range Status   Specimen Description BLOOD BLOOD RIGHT HAND  Final   Special Requests   Final    BOTTLES DRAWN AEROBIC ONLY Blood Culture adequate volume   Culture   Final    NO GROWTH 5 DAYS Performed at [facility], [address], [place], [place] [address]    Report Status 01/15/2022 FINAL  Final  MRSA Next Gen by PCR, Nasal     Status: None   Collection Time: 01/10/22  1:11 PM   Specimen: Nasal Mucosa; Nasal Swab  Result Value Ref Range Status   MRSA by PCR Next Gen NOT DETECTED NOT DETECTED Final    Comment: (NOTE) The GeneXpert MRSA Assay (FDA approved for NASAL specimens only), is one component of a comprehensive MRSA colonization surveillance program. It is not intended to diagnose MRSA infection nor to guide or monitor treatment for MRSA infections. Test performance is not FDA approved in patients less than 79 years old. Performed at [facility], [address] [place], [place] [address]   Culture, Respiratory w Gram Stain     Status: None   Collection Time: 01/12/22  6:14 PM   Specimen: Tracheal Aspirate; Respiratory  Result Value Ref Range Status   Specimen Description TRACHEAL ASPIRATE  Final   Special Requests NONE  Final   Gram Stain NO WBC SEEN NO ORGANISMS SEEN   Final   Culture   Final    RARE Normal respiratory flora-no Staph aureus or Pseudomonas seen Performed at [facility], [address] [place], [place] [address]    Report Status 01/14/2022 FINAL  Final  MRSA Next Gen by PCR, Nasal     Status:  None   Collection Time: 01/12/22  6:50 PM   Specimen: Nasal Mucosa; Nasal Swab  Result Value Ref Range Status   MRSA by PCR Next Gen NOT DETECTED NOT DETECTED Final    Comment: (NOTE) The GeneXpert MRSA Assay (FDA approved for NASAL specimens only), is one component of a comprehensive MRSA colonization surveillance program. It is not intended to diagnose MRSA infection nor to guide or monitor treatment for MRSA infections. Test performance is not FDA approved in patients less than 51 years old. Performed at [facility], [place] [address] [place], [place] [address]          Radiology Studies: ECHOCARDIOGRAM COMPLETE  Result Date: 01/14/2022    ECHOCARDIOGRAM REPORT   Patient Name:   [name] Date of Exam: 01/14/2022 Medical Rec #:  [id number]    Height:       64.0 in Accession #:    [id number]   Weight:       199.3 lb Date of Birth:  1945/08/25     BSA:          1.953 m Patient Age:    53 years     BP:           121/65 mmHg Patient Gender: F  HR:           98 bpm. Exam Location:  Inpatient Procedure: 2D Echo, Cardiac Doppler and Color Doppler Indications:    Ectopy  History:        Patient has prior history of Echocardiogram examinations, most                 recent 10/22/2017. COPD; Risk Factors:Hypertension and                 Dyslipidemia.  Sonographer:    Jefferey Pica Referring Phys: [id number] [place]  1. Left ventricular ejection fraction, by estimation, is 60 to 65%. The left ventricle has normal function. The left ventricle has no regional wall motion abnormalities. Left ventricular diastolic parameters were normal.  2. Right ventricular systolic function is normal. The right ventricular size is normal. There is normal pulmonary artery systolic pressure.  3. Left atrial size was mildly dilated.  4. Right atrial size was mildly dilated.  5. Calcified subchordal apparatus. The mitral valve is abnormal. Trivial mitral valve regurgitation. No evidence of  mitral stenosis.  6. The aortic valve is tricuspid. There is mild calcification of the aortic valve. Aortic valve regurgitation is not visualized. Aortic valve sclerosis is present, with no evidence of aortic valve stenosis.  7. The inferior vena cava is normal in size with greater than 50% respiratory variability, suggesting right atrial pressure of 3 mmHg. FINDINGS  Left Ventricle: Left ventricular ejection fraction, by estimation, is 60 to 65%. The left ventricle has normal function. The left ventricle has no regional wall motion abnormalities. The left ventricular internal cavity size was normal in size. There is  no left ventricular hypertrophy. Left ventricular diastolic parameters were normal. Right Ventricle: The right ventricular size is normal. No increase in right ventricular wall thickness. Right ventricular systolic function is normal. There is normal pulmonary artery systolic pressure. The tricuspid regurgitant velocity is 2.61 m/s, and  with an assumed right atrial pressure of 5 mmHg, the estimated right ventricular systolic pressure is 81.1 mmHg. Left Atrium: Left atrial size was mildly dilated. Right Atrium: Right atrial size was mildly dilated. Pericardium: There is no evidence of pericardial effusion. Mitral Valve: Calcified subchordal apparatus. The mitral valve is abnormal. There is mild thickening of the mitral valve leaflet(s). There is mild calcification of the mitral valve leaflet(s). Trivial mitral valve regurgitation. No evidence of mitral valve stenosis. Tricuspid Valve: The tricuspid valve is normal in structure. Tricuspid valve regurgitation is mild . No evidence of tricuspid stenosis. Aortic Valve: The aortic valve is tricuspid. There is mild calcification of the aortic valve. Aortic valve regurgitation is not visualized. Aortic valve sclerosis is present, with no evidence of aortic valve stenosis. Aortic valve peak gradient measures 15.9 mmHg. Pulmonic Valve: The pulmonic valve was  normal in structure. Pulmonic valve regurgitation is not visualized. No evidence of pulmonic stenosis. Aorta: The aortic root is normal in size and structure. Venous: The inferior vena cava is normal in size with greater than 50% respiratory variability, suggesting right atrial pressure of 3 mmHg. IAS/Shunts: No atrial level shunt detected by color flow Doppler.  LEFT VENTRICLE PLAX 2D LVIDd:         4.40 cm LVIDs:         3.00 cm LV PW:         0.90 cm LV IVS:        0.90 cm LVOT diam:     1.80 cm LV SV:  65 LV SV Index:   33 LVOT Area:     2.54 cm  RIGHT VENTRICLE             IVC RV S prime:     14.90 cm/s  IVC diam: 2.05 cm LEFT ATRIUM             Index        RIGHT ATRIUM           Index LA diam:        3.90 cm 2.00 cm/m   RA Area:     16.40 cm LA Vol (A2C):   62.6 ml 32.05 ml/m  RA Volume:   49.60 ml  25.39 ml/m LA Vol (A4C):   44.9 ml 22.99 ml/m LA Biplane Vol: 53.5 ml 27.39 ml/m  AORTIC VALVE                 PULMONIC VALVE AV Area (Vmax): 1.98 cm     PV Vmax:       1.23 m/s AV Vmax:        199.50 cm/s  PV Peak grad:  6.1 mmHg AV Peak Grad:   15.9 mmHg LVOT Vmax:      155.00 cm/s LVOT Vmean:     97.300 cm/s LVOT VTI:       0.257 m  AORTA Ao Root diam: 2.80 cm Ao Asc diam:  3.10 cm TRICUSPID VALVE TR Peak grad:   27.2 mmHg TR Vmax:        261.00 cm/s  SHUNTS Systemic VTI:  0.26 m Systemic Diam: 1.80 cm Jenkins Rouge MD Electronically signed by Jenkins Rouge MD Signature Date/Time: 01/14/2022/3:20:12 PM    Final         Scheduled Meds:  amiodarone  400 mg Oral BID   Chlorhexidine Gluconate Cloth  6 each Topical Daily   feeding supplement  1 Container Oral TID BM   insulin aspart  0-15 Units Subcutaneous Q6H   metoprolol tartrate  12.5 mg Oral BID   QUEtiapine  25 mg Oral QHS   sodium chloride flush  10-40 mL Intracatheter Q12H   sodium chloride flush  3 mL Intravenous Q12H   Continuous Infusions:  sodium chloride 250 mL (01/15/22 0747)   sodium chloride Stopped (01/15/22 1255)    sodium chloride     heparin 1,550 Units/hr (01/16/22 0801)   potassium chloride     sodium phosphate 45 mmol in dextrose 5 % 250 mL infusion     TPN ADULT (ION) 75 mL/hr at 01/15/22 2200   TPN ADULT (ION)       LOS: 9 days    Time spent: 35 minutes    Inetha Maret A Tuwanna Krausz, MD [facility]   If 7PM-7AM, please contact night-coverage [email or web address]  01/16/2022, 10:58 AM

## 2022-01-17 ENCOUNTER — Inpatient Hospital Stay (HOSPITAL_COMMUNITY): Payer: Medicare Other

## 2022-01-17 DIAGNOSIS — Z515 Encounter for palliative care: Secondary | ICD-10-CM | POA: Diagnosis not present

## 2022-01-17 DIAGNOSIS — Z7189 Other specified counseling: Secondary | ICD-10-CM | POA: Diagnosis not present

## 2022-01-17 DIAGNOSIS — A419 Sepsis, unspecified organism: Secondary | ICD-10-CM | POA: Diagnosis not present

## 2022-01-17 DIAGNOSIS — R6521 Severe sepsis with septic shock: Secondary | ICD-10-CM | POA: Diagnosis not present

## 2022-01-17 LAB — BASIC METABOLIC PANEL
Anion gap: 8 (ref 5–15)
Anion gap: 7 (ref 5–15)
BUN: 34 mg/dL — ABNORMAL HIGH (ref 8–23)
BUN: 41 mg/dL — ABNORMAL HIGH (ref 8–23)
CO2: 22 mmol/L (ref 22–32)
CO2: 22 mmol/L (ref 22–32)
Calcium: 7.1 mg/dL — ABNORMAL LOW (ref 8.9–10.3)
Calcium: 7.3 mg/dL — ABNORMAL LOW (ref 8.9–10.3)
Chloride: 102 mmol/L (ref 98–111)
Chloride: 102 mmol/L (ref 98–111)
Creatinine, Ser: 2.08 mg/dL — ABNORMAL HIGH (ref 0.44–1.00)
Creatinine, Ser: 2.33 mg/dL — ABNORMAL HIGH (ref 0.44–1.00)
GFR, Estimated: 24 mL/min — ABNORMAL LOW (ref >60)
GFR, Estimated: 21 mL/min — ABNORMAL LOW (ref >60)
Glucose, Bld: 112 mg/dL — ABNORMAL HIGH (ref 70–99)
Glucose, Bld: 120 mg/dL — ABNORMAL HIGH (ref 70–99)
Potassium: 2.6 mmol/L — CL (ref 3.5–5.1)
Potassium: 3.1 mmol/L — ABNORMAL LOW (ref 3.5–5.1)
Sodium: 132 mmol/L — ABNORMAL LOW (ref 135–145)
Sodium: 131 mmol/L — ABNORMAL LOW (ref 135–145)

## 2022-01-17 LAB — GLUCOSE, CAPILLARY
Glucose-Capillary: 111 mg/dL — ABNORMAL HIGH (ref 70–99)
Glucose-Capillary: 115 mg/dL — ABNORMAL HIGH (ref 70–99)
Glucose-Capillary: 117 mg/dL — ABNORMAL HIGH (ref 70–99)
Glucose-Capillary: 119 mg/dL — ABNORMAL HIGH (ref 70–99)
Glucose-Capillary: 120 mg/dL — ABNORMAL HIGH (ref 70–99)
Glucose-Capillary: 127 mg/dL — ABNORMAL HIGH (ref 70–99)

## 2022-01-17 LAB — CBC
HCT: 25.8 % — ABNORMAL LOW (ref 36.0–46.0)
Hemoglobin: 8.3 g/dL — ABNORMAL LOW (ref 12.0–15.0)
MCH: 28.5 pg (ref 26.0–34.0)
MCHC: 32.2 g/dL (ref 30.0–36.0)
MCV: 88.7 fL (ref 80.0–100.0)
Platelets: 218 10*3/uL (ref 150–400)
RBC: 2.91 MIL/uL — ABNORMAL LOW (ref 3.87–5.11)
RDW: 18.9 % — ABNORMAL HIGH (ref 11.5–15.5)
WBC: 20.2 10*3/uL — ABNORMAL HIGH (ref 4.0–10.5)
nRBC: 0.1 % (ref 0.0–0.2)

## 2022-01-17 LAB — MAGNESIUM
Magnesium: 1.5 mg/dL — ABNORMAL LOW (ref 1.7–2.4)
Magnesium: 1.9 mg/dL (ref 1.7–2.4)

## 2022-01-17 LAB — TRIGLYCERIDES: Triglycerides: 39 mg/dL (ref <150)

## 2022-01-17 LAB — PHOSPHORUS
Phosphorus: 2.6 mg/dL (ref 2.5–4.6)
Phosphorus: 2.5 mg/dL (ref 2.5–4.6)

## 2022-01-17 LAB — HEPARIN LEVEL (UNFRACTIONATED)
Heparin Unfractionated: 0.21 IU/mL — ABNORMAL LOW (ref 0.30–0.70)
Heparin Unfractionated: 0.3 IU/mL (ref 0.30–0.70)

## 2022-01-17 MED ORDER — DEXTROSE 5 % IV SOLN
3.0000 g | Freq: Once | INTRAVENOUS | Status: DC
  Filled 2022-01-17: qty 6

## 2022-01-17 MED ORDER — MAGNESIUM SULFATE 2 GM/50ML IV SOLN
2.0000 g | Freq: Once | INTRAVENOUS | Status: AC
Start: 2022-01-17 — End: 2022-01-18
  Administered 2022-01-17: 2 g via INTRAVENOUS
  Filled 2022-01-17: qty 50

## 2022-01-17 MED ORDER — ACETAMINOPHEN 10 MG/ML IV SOLN
1000.0000 mg | Freq: Four times a day (QID) | INTRAVENOUS | Status: AC
  Administered 2022-01-17 – 2022-01-18 (×3): 1000 mg via INTRAVENOUS
  Filled 2022-01-17 (×4): qty 100

## 2022-01-17 MED ORDER — IOHEXOL 9 MG/ML PO SOLN
500.0000 mL | ORAL | Status: AC
  Administered 2022-01-17 (×2): 500 mL via ORAL

## 2022-01-17 MED ORDER — OXYCODONE HCL 5 MG PO TABS
5.0000 mg | ORAL_TABLET | Freq: Four times a day (QID) | ORAL | Status: DC | PRN
  Administered 2022-01-17 – 2022-02-04 (×26): 5 mg via ORAL
  Filled 2022-01-17 (×27): qty 1

## 2022-01-17 MED ORDER — TRACE MINERALS CU-MN-SE-ZN 300-55-60-3000 MCG/ML IV SOLN
INTRAVENOUS | Status: AC
  Filled 2022-01-17: qty 738.4

## 2022-01-17 MED ORDER — POTASSIUM CHLORIDE CRYS ER 20 MEQ PO TBCR
40.0000 meq | EXTENDED_RELEASE_TABLET | Freq: Once | ORAL | Status: AC
  Administered 2022-01-17: 40 meq via ORAL
  Filled 2022-01-17: qty 2

## 2022-01-17 MED ORDER — INSULIN ASPART 100 UNIT/ML IJ SOLN: 0.0000 [IU] | Freq: Four times a day (QID) | INTRAMUSCULAR | Status: DC

## 2022-01-17 MED ORDER — POTASSIUM CHLORIDE 10 MEQ/100ML IV SOLN
10.0000 meq | INTRAVENOUS | Status: AC
  Administered 2022-01-17 (×4): 10 meq via INTRAVENOUS
  Filled 2022-01-17 (×4): qty 100

## 2022-01-17 MED ORDER — HYDROCODONE-ACETAMINOPHEN 5-325 MG PO TABS
1.0000 | ORAL_TABLET | Freq: Four times a day (QID) | ORAL | Status: DC | PRN
  Administered 2022-01-17: 1 via ORAL
  Filled 2022-01-17: qty 1

## 2022-01-17 MED ORDER — CHLORHEXIDINE GLUCONATE CLOTH 2 % EX PADS
6.0000 | MEDICATED_PAD | Freq: Every day | CUTANEOUS | Status: DC
Start: 2022-01-18 — End: 2022-02-02
  Administered 2022-01-18 – 2022-01-29 (×11): 6 via TOPICAL

## 2022-01-17 MED ORDER — ESCITALOPRAM OXALATE 10 MG PO TABS
10.0000 mg | ORAL_TABLET | Freq: Every day | ORAL | Status: DC
  Administered 2022-01-17 – 2022-02-04 (×19): 10 mg via ORAL
  Filled 2022-01-17 (×20): qty 1

## 2022-01-17 MED ORDER — FENTANYL CITRATE PF 50 MCG/ML IJ SOSY
12.5000 ug | PREFILLED_SYRINGE | Freq: Once | INTRAMUSCULAR | Status: AC
  Administered 2022-01-17: 12.5 ug via INTRAVENOUS
  Filled 2022-01-17: qty 1

## 2022-01-17 NOTE — Progress Notes (Addendum)
HOSPITAL MEDICINE OVERNIGHT EVENT NOTE    Notified by nursing earlier in the evening that patient had a sudden increase in severe abdominal pain shortly after attempting to take in clear liquids.  Nursing exam revealed a soft abdomen that was diffusely tender.  Stat abdominal x-ray was obtained which revealed a persisting obstruction without perforation.  Considering severe pain since attempting to ingest clear liquids we will keep patient n.p.o. for now.  No evidence of active nausea and vomiting.  Patient is currently awake alert and oriented x3.  We will give a one-time dose of very small amount of intravenous fentanyl for her severe abdominal pain.  Low-dose given to avoid recurrence of lethargy that occurred on day shift.  Continuing to monitor closely.  Cindy Emerald  MD [facility]   ADDENDUM (8/24 4:10am)  Magnesium 1.5, 2 g of intravenous magnesium sulfate ordered.  Potassium 2.6, managing conservatively considering renal disease with 40 mill equivalents of IV potassium.  [name]

## 2022-01-17 NOTE — Evaluation (Signed)
Occupational Therapy Evaluation Patient Details Name: [name] MRN: [id number] DOB: 1946/03/03 Today's Date: 01/17/2022   History of Present Illness 76 yo female admitted 8/14 to [facility] with vomiting and diarrhea with partial SBO. 8/17 intubated and ex lap. 8/19 pt with worsening renal function, transfer to [facility] and CRRT started. 8/21 extubation with CRRT off. PMHx: COPD, HLD, HTN, PE, hypothyroidism, OA and depression   Clinical Impression   Patient admitted for the diagnosis above.  Patient having emesis during attempted eval.  RN aware, in addition, patient with loose stool.  Currently she is Max A to total A for all functional tasks at bedlevel.  Deficits are listed below, OT will follow, and SNF for post acute rehab trial recommended.        Recommendations for follow up therapy are one component of a multi-disciplinary discharge planning process, led by the attending physician.  Recommendations may be updated based on patient status, additional functional criteria and insurance authorization.   Follow Up Recommendations  Skilled nursing-short term rehab (<3 hours/day)    Assistance Recommended at Discharge Frequent or constant Supervision/Assistance  Patient can return home with the following Help with stairs or ramp for entrance;Assistance with feeding;Assistance with cooking/housework;Assist for transportation;Two people to help with walking and/or transfers;Direct supervision/assist for financial management;Two people to help with bathing/dressing/bathroom;Direct supervision/assist for medications management    Functional Status Assessment  Patient has had a recent decline in their functional status and demonstrates the ability to make significant improvements in function in a reasonable and predictable amount of time.  Equipment Recommendations  Wheelchair cushion (measurements OT);Wheelchair (measurements OT)    Recommendations for Other Services       Precautions /  Restrictions Precautions Precautions: Fall Precaution Comments: bowel incontinence, skin integrity.  NPO Restrictions Weight Bearing Restrictions: No      Mobility Bed Mobility Overal bed mobility: Needs Assistance Bed Mobility: Rolling Rolling: Max assist              Transfers                          Balance                                           ADL either performed or assessed with clinical judgement   ADL Overall ADL's : Needs assistance/impaired Eating/Feeding: NPO   Grooming: Wash/dry face;Maximal assistance;Bed level   Upper Body Bathing: Maximal assistance;Bed level   Lower Body Bathing: Total assistance;Bed level   Upper Body Dressing : Maximal assistance;Bed level   Lower Body Dressing: Total assistance;Bed level       Toileting- Clothing Manipulation and Hygiene: Total assistance;Bed level               Vision Baseline Vision/History: 1 Wears glasses Vision Assessment?: No apparent visual deficits     Perception Perception Perception: Not tested   Praxis Praxis Praxis: Not tested    Pertinent Vitals/Pain Pain Assessment Pain Assessment: Faces Faces Pain Scale: Hurts a little bit Pain Location: generalized Pain Descriptors / Indicators: Discomfort Pain Intervention(s): Monitored during session     Hand Dominance Right   Extremity/Trunk Assessment Upper Extremity Assessment Upper Extremity Assessment: Generalized weakness;RUE deficits/detail;LUE deficits/detail RUE Deficits / Details: increased edema throughout - impairs functional use RUE Sensation: decreased light touch RUE Coordination: decreased fine motor;decreased gross motor LUE Deficits /  Details: increased edema throughout - impairs functional use LUE Sensation: decreased light touch LUE Coordination: decreased fine motor;decreased gross motor   Lower Extremity Assessment Lower Extremity Assessment: Defer to PT evaluation        Communication Communication Communication: HOH   Cognition Arousal/Alertness: Awake/alert Behavior During Therapy: Restless Overall Cognitive Status: No family/caregiver present to determine baseline cognitive functioning                                       General Comments  Watery emesis, clear.      Exercises     Shoulder Instructions      Home Living Family/patient expects to be discharged to:: Private residence Living Arrangements: Children   Type of Home: House Home Access: Stairs to enter [employer] of Steps: 2   Home Layout: Two level;Able to live on main level with bedroom/bathroom     Bathroom Shower/Tub: Teacher, early years/pre: Standard     Home Equipment: Conservation officer, nature (2 wheels)          Prior Functioning/Environment Prior Level of Function : Independent/Modified Independent               ADLs Comments: Ind with ADL, iADL, continues to drive and cares for daughter.        OT Problem List: Decreased strength;Decreased range of motion;Decreased activity tolerance;Impaired balance (sitting and/or standing);Impaired UE functional use;Decreased safety awareness;Pain;Increased edema      OT Treatment/Interventions: Self-care/ADL training;Therapeutic exercise;Therapeutic activities;DME and/or AE instruction;Patient/family education;Balance training    OT Goals(Current goals can be found in the care plan section) Acute Rehab OT Goals Patient Stated Goal: None stated OT Goal Formulation: Patient unable to participate in goal setting Time For Goal Achievement: 01/31/22 Potential to Achieve Goals: Fair ADL Goals Pt Will Perform Grooming: with min assist;bed level Pt/caregiver will Perform Home Exercise Program: Increased strength;Both right and left upper extremity;With minimal assist Additional ADL Goal #1: Patient will roll side to side with Min A and use of SR's to increase independence with toileting  OT  Frequency: Min 2X/week    Co-evaluation              AM-PAC OT "6 Clicks" Daily Activity     Outcome Measure Help from another person eating meals?: Total Help from another person taking care of personal grooming?: A Lot Help from another person toileting, which includes using toliet, bedpan, or urinal?: Total Help from another person bathing (including washing, rinsing, drying)?: A Lot Help from another person to put on and taking off regular upper body clothing?: A Lot Help from another person to put on and taking off regular lower body clothing?: Total 6 Click Score: 9   End of Session Nurse Communication: Mobility status  Activity Tolerance: Patient limited by fatigue;Treatment limited secondary to medical complications (Comment) Patient left: in bed;with call bell/phone within reach  OT Visit Diagnosis: Muscle weakness (generalized) (M62.81)                Time: 1350-1402 OT Time Calculation (min): 12 min Charges:  OT General Charges $OT Visit: 1 Visit OT Evaluation $OT Eval Moderate Complexity: 1 Mod  01/17/2022  RP, OTR/L  Acute Rehabilitation Services  Office:  [phone]   Metta Clines 01/17/2022, 2:12 PM

## 2022-01-17 NOTE — Progress Notes (Signed)
Progress Note  Patient Name: [name] Date of Encounter: 01/17/2022  Primary Cardiologist: Carlyle Dolly, MD   Subjective   Overnight had severe abdominal pain. Found to have persistent obstruction without perforation. Now NPO K very low and being repleted Mg repleted  Inpatient Medications    Scheduled Meds:  Chlorhexidine Gluconate Cloth  6 each Topical Daily   feeding supplement  1 Container Oral TID BM   insulin aspart  0-15 Units Subcutaneous Q6H   metoprolol tartrate  12.5 mg Oral BID   sodium chloride flush  10-40 mL Intracatheter Q12H   sodium chloride flush  3 mL Intravenous Q12H   Continuous Infusions:  sodium chloride 250 mL (01/15/22 0747)   sodium chloride Stopped (01/15/22 1255)   sodium chloride     amiodarone 30 mg/hr (01/17/22 0215)   aztreonam 1 g (01/17/22 0626)   heparin 1,750 Units/hr (01/17/22 0233)   metronidazole 500 mg (01/17/22 0459)   potassium chloride 10 mEq (01/17/22 0725)   TPN ADULT (ION) 75 mL/hr at 01/16/22 1724   PRN Meds: sodium chloride, Place/Maintain arterial line **AND** sodium chloride, acetaminophen, albuterol, alteplase, lip balm, [DISCONTINUED] ondansetron **OR** ondansetron (ZOFRAN) IV, mouth rinse, sodium chloride, sodium chloride flush, sodium chloride flush   Vital Signs    Vitals:   01/16/22 1245 01/16/22 2237 01/17/22 0140 01/17/22 0810  BP: (!) 116/53 119/65 114/69 124/77  Pulse: 93 78 71 81  Resp: 19  20 (!) 23  Temp: 98.1 F (36.7 C)  98.1 F (36.7 C) 97.9 F (36.6 C)  TempSrc: Oral  Oral Oral  SpO2: 95%  99% 95%  Weight:      Height:        Intake/Output Summary (Last 24 hours) at 01/17/2022 0815 Last data filed at 01/17/2022 0700 Gross per 24 hour  Intake 3544.62 ml  Output 1 ml  Net 3543.62 ml   Filed Weights   01/13/22 1236 01/14/22 0500 01/15/22 0500  Weight: 89.8 kg 90.4 kg 94.4 kg    Telemetry    Atrial fibrillation with PVCs and with abberant conduction Her tachycardia listed  as VT was in the low 100s, this WCT favors abberancy conduction over very slow VT  - Personally Reviewed  Physical Exam   Gen: No distress BMI 35 Neck: No JVD Cardiac: No Rubs or Gallops, no Murmur, RRR  Respiratory: Clear to auscultation bilaterally, normal effort, normal  respiratory rate GI: Soft, non distended MS: bilateral edema  Labs    Chemistry Recent Labs  Lab 01/14/22 0400 01/14/22 1615 01/15/22 0431 01/16/22 0627 01/17/22 0115  NA 138 136 138 134* 132*  K 3.7 3.6 3.3* 3.3* 2.6*  CL 109 107 107 106 102  CO2 20* '24 24 22 22  '$ GLUCOSE 158* 125* 98 113* 112*  BUN 31* 23 28* 43* 34*  CREATININE 2.08* 1.64* 2.09* 2.75* 2.08*  CALCIUM 7.4* 7.3* 7.4* 7.7* 7.1*  PROT 4.5*  --  4.1* 4.1*  --   ALBUMIN 1.9* 1.8* 1.8* 1.8*  --   AST 20  --  34 25  --   ALT 16  --  20 17  --   ALKPHOS 84  --  155* 128*  --   BILITOT 0.7  --  0.7 0.6  --   GFRNONAA 24* 32* 24* 17* 24*  ANIONGAP '9 5 7 6 8     '$ Hematology Recent Labs  Lab 01/15/22 0431 01/16/22 0627 01/17/22 0536  WBC 18.8* 19.5* 20.2*  RBC 3.01*  3.14* 2.91*  HGB 8.4* 8.8* 8.3*  HCT 26.8* 28.2* 25.8*  MCV 89.0 89.8 88.7  MCH 27.9 28.0 28.5  MCHC 31.3 31.2 32.2  RDW 18.6* 18.9* 18.9*  PLT 185 202 218    Cardiac EnzymesNo results for input(s): "TROPONINI" in the last 168 hours. No results for input(s): "TROPIPOC" in the last 168 hours.   BNPNo results for input(s): "BNP", "PROBNP" in the last 168 hours.   DDimer No results for input(s): "DDIMER" in the last 168 hours.   Radiology    DG Abd 2 Views  Result Date: 01/17/2022 CLINICAL DATA:  Abdominal pain EXAM: ABDOMEN - 2 VIEW COMPARISON:  CT 01/09/2022 FINDINGS: Laparotomy skin staples are noted. Multiple gas-filled dilated loops of small bowel are again seen within the mid abdomen suggesting a postoperative ileus or persistent small bowel obstruction. No gas is seen within the large bowel. No gross free intraperitoneal gas. There is central clumping of bowel  loops suggesting underlying ascites. Small bilateral pleural effusions are present. Central venous catheter tip noted within the low right atrium. Cholecystectomy clips in the right upper quadrant. IMPRESSION: 1. Persistent small bowel obstruction versus postoperative ileus. 2. Suspected ascites. 3. Small bilateral pleural effusions. Electronically Signed   By: Fidela Salisbury M.D.   On: 01/17/2022 00:13   CT HEAD WO CONTRAST (5MM)  Result Date: 01/16/2022 CLINICAL DATA:  Provided history: Altered mental status, nontraumatic. EXAM: CT HEAD WITHOUT CONTRAST TECHNIQUE: Contiguous axial images were obtained from the base of the skull through the vertex without intravenous contrast. RADIATION DOSE REDUCTION: This exam was performed according to the departmental dose-optimization program which includes automated exposure control, adjustment of the mA and/or kV according to patient size and/or use of iterative reconstruction technique. COMPARISON:  No pertinent prior exams available for comparison. FINDINGS: Brain: Mild generalized parenchymal atrophy. There is no acute intracranial hemorrhage. No demarcated cortical infarct. No extra-axial fluid collection. No evidence of an intracranial mass. No midline shift. Vascular: No hyperdense vessel. Atherosclerotic calcifications. Skull: No fracture or aggressive osseous lesion. Sinuses/Orbits: No mass or acute finding within the imaged orbits. 18 mm polyp versus mucous retention cyst, and minimal background mucosal thickening, within the left maxillary sinus. 16 mm mucous retention cyst versus polyp within the left frontal sinus. Other: Small-volume fluid within the bilateral mastoid air cells. IMPRESSION: 1. No evidence of acute intracranial abnormality. 2. Mild generalized parenchymal atrophy. 3. Paranasal sinus disease, as described. 4. Small bilateral mastoid effusions. Electronically Signed   By: Kellie Simmering D.O.   On: 01/16/2022 16:30   DG CHEST PORT 1  VIEW  Result Date: 01/16/2022 CLINICAL DATA:  Leukocytosis. EXAM: PORTABLE CHEST 1 VIEW COMPARISON:  Chest 01/12/2022 FINDINGS: Endotracheal tube removed.  NG removed. Left jugular dual lumen catheter tip in the SVC unchanged. Right sided central venous catheter extends into the right atrium with the tip not visualized but appears unchanged from the prior study Cardiac enlargement with vascular congestion and bilateral airspace disease most compatible with edema. Bilateral pleural effusions and bibasilar atelectasis/edema unchanged. No pneumothorax IMPRESSION: Endotracheal tube removed. Diffuse bilateral airspace disease and bilateral effusions most likely pulmonary edema unchanged. Underlying pneumonia not excluded. Electronically Signed   By: Franchot Gallo M.D.   On: 01/16/2022 15:35     Patient Profile     76 y.o. female with new AF s/p septic shock  Assessment & Plan    Persistent atrial fibrillation- with abberrant conduction  Frequent PVCs  Hypokalemia and Hypomagnesemia Chronic LE Edema complicated by  protein calorie malnutrition - CHADS-VASc 6 (CHF, HTN, diabetes, age x2, gender) - overnight unclear if able to swallow safely; IV amiodarone and IV  - K bath and iHD per nephrology; appreciate recs. K goal of 4, mag goal of 2; has 11 AM labs for f/u - if able to tolerate PO, would continue low dose BB  Called son and reviewed what her heart rhythms in the context of her stroke risk, her electrolytes, and a long term plan. No further questions.  Will attempt to call tomorrow if able   For questions or updates, please contact [facility] Please consult [email or web address] for contact info under Cardiology/STEMI.      Rudean Haskell, MD [address], [address] [place], [place] [address] [phone]  8:15 AM

## 2022-01-17 NOTE — Progress Notes (Signed)
SLP Cancellation Note  Patient Details Name: [name] MRN: [id number] DOB: 1945-11-11   Cancelled treatment:       Reason Eval/Treat Not Completed: Medical issues which prohibited therapy. Pt made NPO by MD after increase in abdominal pain after PO intake. Will f/u once medically able to resume POs.    Osie Bond., M.A. [place] Office [phone]  Secure chat preferred  01/17/2022, 7:46 AM

## 2022-01-17 NOTE — Progress Notes (Addendum)
Dr. Cyd Silence notified about critical potassium 2.6 and magnesium 1.5. Awaiting call back. Orders in place to replaced electrolytes see MAR for details.

## 2022-01-17 NOTE — Progress Notes (Signed)
Physical Therapy Treatment Patient Details Name: [name] MRN: [id number] DOB: April 30, 1946 Today's Date: 01/17/2022   History of Present Illness 76 yo female admitted 8/14 to [facility] with vomiting and diarrhea with partial SBO. 8/17 intubated and ex lap. 8/19 pt with worsening renal function, transfer to [facility] and CRRT started. 8/21 extubation with CRRT off. PMHx: COPD, HLD, HTN, PE, hypothyroidism, OA and depression    PT Comments    Pt remains limited by fatigue, tiring with sitting at edge of bed. Pt demonstrates profound weakness as well as edema in all extremities. Pt is initially able to stand with significant assistance, but unable with further attempts. Pt will benefit from continued aggressive mobilization in an effort to reduce falls risk and caregiver burden. PT continues to recommend SNF placement.   Recommendations for follow up therapy are one component of a multi-disciplinary discharge planning process, led by the attending physician.  Recommendations may be updated based on patient status, additional functional criteria and insurance authorization.  Follow Up Recommendations  Skilled nursing-short term rehab (<3 hours/day) Can patient physically be transported by private vehicle: No   Assistance Recommended at Discharge Frequent or constant Supervision/Assistance  Patient can return home with the following Two people to help with walking and/or transfers;Direct supervision/assist for medications management;Two people to help with bathing/dressing/bathroom;Assist for transportation;Direct supervision/assist for financial management   Equipment Recommendations  Other (comment);Hospital bed (hoyer lift)    Recommendations for Other Services       Precautions / Restrictions Precautions Precautions: Fall Precaution Comments: bowel incontinence Restrictions Weight Bearing Restrictions: No     Mobility  Bed Mobility Overal bed mobility: Needs Assistance Bed Mobility: Supine  to Sit, Sit to Supine     Supine to sit: Max assist, HOB elevated Sit to supine: Max assist        Transfers Overall transfer level: Needs assistance Equipment used: 1 person hand held assist Transfers: Sit to/from Stand Sit to Stand: Max assist           General transfer comment: PT provides knee block and BUE support, pt successfully stands on initial attempt but unable to ascend into upright position for 2nd and 3rd attempts    Ambulation/Gait                   Stairs             Wheelchair Mobility    Modified Rankin (Stroke Patients Only)       Balance Overall balance assessment: Needs assistance Sitting-balance support: Single extremity supported, Feet supported Sitting balance-Leahy Scale: Poor Sitting balance - Comments: reliant on UE support of bed or railing   Standing balance support: Bilateral upper extremity supported Standing balance-Leahy Scale: Poor Standing balance comment: maxA                            Cognition Arousal/Alertness: Awake/alert Behavior During Therapy: WFL for tasks assessed/performed Overall Cognitive Status: Impaired/Different from baseline Area of Impairment: Orientation, Attention, Memory, Following commands, Safety/judgement, Awareness, Problem solving                 Orientation Level: Disoriented to, Time Current Attention Level: Sustained Memory: Decreased short-term memory Following Commands: Follows one step commands with increased time Safety/Judgement: Decreased awareness of safety, Decreased awareness of deficits Awareness: Intellectual Problem Solving: Slow processing, Decreased initiation, Difficulty sequencing, Requires verbal cues          Exercises  General Comments General comments (skin integrity, edema, etc.): VSS on 3L [place], pt fatigues quickly during session      Pertinent Vitals/Pain Pain Assessment Pain Assessment: Faces Faces Pain Scale: Hurts little  more Pain Location: R ribs Pain Descriptors / Indicators: Grimacing Pain Intervention(s): Monitored during session    Home Living                          Prior Function            PT Goals (current goals can now be found in the care plan section) Acute Rehab PT Goals Patient Stated Goal: be able to go home Progress towards PT goals: Not progressing toward goals - comment (limited by fatigue)    Frequency    Min 3X/week      PT Plan Current plan remains appropriate    Co-evaluation              AM-PAC PT "6 Clicks" Mobility   Outcome Measure  Help needed turning from your back to your side while in a flat bed without using bedrails?: A Lot Help needed moving from lying on your back to sitting on the side of a flat bed without using bedrails?: A Lot Help needed moving to and from a bed to a chair (including a wheelchair)?: Total Help needed standing up from a chair using your arms (e.g., wheelchair or bedside chair)?: A Lot Help needed to walk in hospital room?: Total Help needed climbing 3-5 steps with a railing? : Total 6 Click Score: 9    End of Session Equipment Utilized During Treatment: Oxygen Activity Tolerance: Patient limited by fatigue Patient left: in bed;with call bell/phone within reach;with bed alarm set Nurse Communication: Mobility status;Need for lift equipment PT Visit Diagnosis: Other abnormalities of gait and mobility (R26.89);Difficulty in walking, not elsewhere classified (R26.2);Muscle weakness (generalized) (M62.81)     Time: 1025-1101 PT Time Calculation (min) (ACUTE ONLY): 36 min  Charges:  $Therapeutic Activity: 23-37 mins                     Zenaida Niece, PT, DPT Acute Rehabilitation Office [place] Kolina Kube 01/17/2022, 12:54 PM

## 2022-01-17 NOTE — Plan of Care (Signed)
  Problem: Clinical Measurements: Goal: Respiratory complications will improve Outcome: Progressing   Problem: Pain Managment: Goal: General experience of comfort will improve Outcome: Not Progressing   Problem: Safety: Goal: Ability to remain free from injury will improve Outcome: Progressing   Problem: Skin Integrity: Goal: Risk for impaired skin integrity will decrease Outcome: Not Progressing   Problem: Respiratory: Goal: Ability to maintain adequate ventilation will improve Outcome: Progressing

## 2022-01-17 NOTE — Progress Notes (Signed)
[place] for Heparin Indication: atrial fibrillation  Allergies  Allergen Reactions   Celebrex [Celecoxib] Swelling   Keflet [Cephalexin] Swelling   Penicillins Hives and Swelling    DID THE REACTION INVOLVE: Swelling of the face/tongue/throat, SOB, or low BP? Yes-swelling-hives Sudden or severe rash/hives, skin peeling, or the inside of the mouth or nose? Unknown Did it require medical treatment? Unknown When did it last happen?    over 10 years   If all above answers are "NO", may proceed with cephalosporin use.    Sulfa Antibiotics Swelling   Kenalog [Triamcinolone Acetonide]     unknown   Latex    Lisinopril Other (See Comments)    unknown   Mobic [Meloxicam]     SWELLING    Patient Measurements: Height: '5\' 4"'$  (162.6 cm) Weight:  (unable to obtain) IBW/kg (Calculated) : 54.7 Heparin Dosing Weight: 71.5 kg  Vital Signs: BP: 119/65 (08/23 2237) Pulse Rate: 78 (08/23 2237)  Labs: Recent Labs    01/14/22 0400 01/14/22 1248 01/14/22 1615 01/15/22 0431 01/15/22 1251 01/15/22 2350 01/16/22 0627 01/16/22 0858 01/17/22 0028  HGB 9.4*  --   --  8.4*  --   --  8.8*  --   --   HCT 28.9*  --   --  26.8*  --   --  28.2*  --   --   PLT 145*  --   --  185  --   --  202  --   --   APTT 197*  --   --   --   --   --   --   --   --   HEPARINUNFRC 0.49   < >  --  0.21*   < > 0.15*  --  0.29* 0.21*  CREATININE 2.08*  --  1.64* 2.09*  --   --  2.75*  --   --    < > = values in this interval not displayed.    Estimated Creatinine Clearance: 19.7 mL/min (A) (by C-G formula based on SCr of 2.75 mg/dL (H)).   Assessment: Pharmacy consulted to start heparin infusion for post-op afib. Patient was not on any AC PTA.    8/24 AM update:  Heparin level low No issues per RN  Goal of Therapy:  Heparin level 0.3-0.7 units/ml Monitor platelets by anticoagulation protocol: Yes   Plan:  Increase heparin to 1750 units/hr Check a heparin level  in 8 hours   Narda Bonds, PharmD, [place] Pharmacist Phone: [phone]

## 2022-01-17 NOTE — TOC Progression Note (Signed)
Transition of Care [facility]) - Progression Note    Patient Details  Name: [name] MRN: [id number] Date of Birth: February 12, 1946  Transition of Care [facility]) CM/SW [place], [place] Phone Number: 01/17/2022, 9:56 AM  Clinical Narrative:     CSW following to fax patient out for SNF closer to patient being medically ready for dc. Patient currently on TPN. CSW will continue to follow and assist with patients dc planning needs.  Expected Discharge Plan: Home/Self Care Barriers to Discharge: Continued Medical Work up  Expected Discharge Plan and Services Expected Discharge Plan: Home/Self Care                                               Social Determinants of Health (SDOH) Interventions    Readmission Risk Interventions     No data to display

## 2022-01-17 NOTE — Progress Notes (Signed)
PROGRESS NOTE    [name]  YTK:[id number] DOB: [birth year] DOA: 01/07/2022 PCP: Chevis Pretty, FNP   Brief Narrative: 76 year old with past medical history hyperlipidemia, hypertension, hypothyroidism, PE in the past, osteoarthritis, depression who presents from home with nausea vomiting and diarrhea since 8/11.  CT scan was obtained and was unremarkable.  She received IV fluids and antiemetic and was discharged home.  Her vomiting and diarrhea persisted, she presented to [facility], CT scan repeated found to have partial small bowel obstruction and significant changes compared to CT done on 8/11. Admitted 80/14 for bowel obstruction and was managed medically.  She developed acute renal failure, developed shock and underwent emergent exploratory laparotomy with lysis of adhesion on 8/17.  Intubated for Sx.  She was started on CRRT.  She was transferred to [facility] 8/19.  She was subsequently extubated 8/21st, started on TNA, CRRT discontinued.  She was started on hemodialysis first section 8/23. During hospitalization she also developed A-fib with RVR, started on amiodarone, cardiology consulted.  In hemodialysis 8/23  she was noted to be more lethargic, she was noted to have aberrantly conducted A-fib with PVCs, versus slow rate  V. Tach.  IV amiodarone was resumed.     Assessment & Plan:   Principal Problem:   Septic shock ([place]) Active Problems:   Respiratory failure requiring intubation ([place])   Small bowel obstruction (HCC)   COPD (chronic obstructive pulmonary disease) (HCC)   Hyperlipidemia   Hypothyroidism (acquired)   Essential hypertension   History of pulmonary embolus (PE)   AKI (acute kidney injury) ([place])   Acute respiratory failure with hypoxia and hypercapnia (HCC)   Pressure injury of skin   Malnutrition of moderate degree   1-SBO status post laparotomy 8/17 NG tube has been removed.  Patient started on TPN 8/21 Treated course of aztreonam with  metronidazole Surgery following Had BM watery today. Rectal tube place per surgery./  Worsening Leukocytosis, KUB with concern for SBO.  Antibiotics resume.  Plan for CT abdomen and pelvis, due to abdominal pain and leukocytosis.  Vicodin PRN for pain.   2-Acute Metabolic Encephalopathy: She was more lethargic 8/23. ABG with hypoxia--- placed on oxygen CCM to evaluate, recommended support care.  Chest x ray: with pulmonary edema, PNA not excluded. IV antibiotics resume.  CT head. Negative.  She is more alert today, and answering questions.   3-A-fib with RVR, PVC Versus slow rate  V. tach IV amiodarone  Replete Mg and K.  On low dose metoprolol.   4-Acute hypoxic respiratory failure; ? PNA Vs Pulmonary edema ABG Po2 58 Placed on oxygen.. Chest x ray : pulmonary edema vs infection.  Started IV Aztreonam and flagyl.  Volume manage with HD>    AKI likely from ATN from sepsis Treated with CRRT and discontinue 80/21 Nephrology  following and initiating intermittent hemodialysis  5-Moderate protein malnutrition: Continue with TPN  Chronic anemia: Monitor hemoglobin  Hypokalemia, hypophosphatemia,: Replete.   Leukocytosis: Chest x ray PNA vs edema.  Plan for CT abdomen pelvis.   See wound care documentation below  Pressure Injury 01/10/22 Coccyx Medial;Mid Stage 2 -  Partial thickness loss of dermis presenting as a shallow open injury with a red, pink wound bed without slough. pink (Active)  01/10/22 1200  Location: Coccyx  Location Orientation: Medial;Mid  Staging: Stage 2 -  Partial thickness loss of dermis presenting as a shallow open injury with a red, pink wound bed without slough.  Wound Description (Comments): pink  Present on  Admission:   Dressing Type None 01/16/22 2015     Pressure Injury 01/12/22 Face Left Stage 2 -  Partial thickness loss of dermis presenting as a shallow open injury with a red, pink wound bed without slough. red ulceration beneath ETT  holder on L cheek (Active)  01/12/22 1815  Location: Face  Location Orientation: Left  Staging: Stage 2 -  Partial thickness loss of dermis presenting as a shallow open injury with a red, pink wound bed without slough.  Wound Description (Comments): red ulceration beneath ETT holder on L cheek  Present on Admission: Yes  Dressing Type None 01/16/22 2015     Pressure Injury 01/16/22 Lumbar Left (Active)  01/16/22 1230  Location: Lumbar  Location Orientation: Left  Staging:   Wound Description (Comments):   Present on Admission: Yes  Dressing Type Foam - Lift dressing to assess site every shift 01/16/22 2015     Pressure Injury 01/16/22 Buttocks Left (Active)  01/16/22 1230  Location: Buttocks  Location Orientation: Left  Staging:   Wound Description (Comments):   Present on Admission: Yes     Nutrition Problem: Moderate Malnutrition Etiology: acute illness (SBO)    Signs/Symptoms: energy intake < 75% for > 7 days, mild muscle depletion, moderate fat depletion    Interventions: TPN  Estimated body mass index is 35.72 kg/m as calculated from the following:   Height as of this encounter: '5\' 4"'$  (1.626 m).   Weight as of this encounter: 94.4 kg.   DVT prophylaxis: Heparin drip Code Status: Full code Family Communication: Son updated over the phone 8/24 Disposition Plan:  Status is: Inpatient Remains inpatient appropriate because: Remain for management of SBO, status postsurgery, A-fib, renal failure    Consultants:  Nephrology CCM Surgery  Procedures:    Antimicrobials:    Subjective: She is more awake, asking for pain medication. Having Diarrhea.    Objective: Vitals:   01/17/22 0900 01/17/22 1100 01/17/22 1108 01/17/22 1145  BP:   127/84 126/73  Pulse:   84   Resp:    (!) 25  Temp:    97.9 F (36.6 C)  TempSrc:    Oral  SpO2: 93% 96%  100%  Weight:      Height:        Intake/Output Summary (Last 24 hours) at 01/17/2022 1443 Last data  filed at 01/17/2022 1406 Gross per 24 hour  Intake 4468.87 ml  Output --  Net 4468.87 ml    Filed Weights   01/13/22 1236 01/14/22 0500 01/15/22 0500  Weight: 89.8 kg 90.4 kg 94.4 kg    Examination:  General exam: NAD Respiratory system: BL crackles.  Cardiovascular system: S 1, S 2 IRR Gastrointestinal system: BS present, soft, midline incision cover.  Central nervous system: she is more alert, answering questions.  Extremities: Plus 2 edema \  Data Reviewed: I have personally reviewed following labs and imaging studies  CBC: Recent Labs  Lab 01/11/22 0356 01/12/22 0344 01/13/22 0448 01/14/22 0400 01/15/22 0431 01/16/22 0627 01/17/22 0536  WBC 23.0* 15.9* 14.7* 13.0* 18.8* 19.5* 20.2*  NEUTROABS 18.1* 11.2* 12.1*  --   --   --   --   HGB 10.2* 8.8* 10.2* 9.4* 8.4* 8.8* 8.3*  HCT 31.8* 27.7* 31.9* 28.9* 26.8* 28.2* 25.8*  MCV 89.3 87.7 87.6 85.8 89.0 89.8 88.7  PLT 164 137* 144* 145* 185 202 662    Basic Metabolic Panel: Recent Labs  Lab 01/14/22 0400 01/14/22 1615 01/15/22 0431 01/16/22 0627 01/17/22  0115 01/17/22 0536 01/17/22 1100  NA 138 136 138 134* 132*  --  131*  K 3.7 3.6 3.3* 3.3* 2.6*  --  3.1*  CL 109 107 107 106 102  --  102  CO2 20* '24 24 22 22  '$ --  22  GLUCOSE 158* 125* 98 113* 112*  --  120*  BUN 31* 23 28* 43* 34*  --  41*  CREATININE 2.08* 1.64* 2.09* 2.75* 2.08*  --  2.33*  CALCIUM 7.4* 7.3* 7.4* 7.7* 7.1*  --  7.3*  MG 2.4  --  2.1 2.1 1.5*  --  1.9  PHOS 1.9* 1.6* 2.0* 1.4* 2.6 2.5  --     GFR: Estimated Creatinine Clearance: 23.3 mL/min (A) (by C-G formula based on SCr of 2.33 mg/dL (H)). Liver Function Tests: Recent Labs  Lab 01/12/22 0344 01/12/22 1423 01/12/22 2123 01/13/22 1521 01/14/22 0400 01/14/22 1615 01/15/22 0431 01/16/22 0627  AST 32 27  --   --  20  --  34 25  ALT 14 15  --   --  16  --  20 17  ALKPHOS 57 61  --   --  84  --  155* 128*  BILITOT 1.2 0.9  --   --  0.7  --  0.7 0.6  PROT 4.2* 4.4*  --   --   4.5*  --  4.1* 4.1*  ALBUMIN 2.0* 2.0*   < > 1.9* 1.9* 1.8* 1.8* 1.8*   < > = values in this interval not displayed.    No results for input(s): "LIPASE", "AMYLASE" in the last 168 hours. No results for input(s): "AMMONIA" in the last 168 hours. Coagulation Profile: No results for input(s): "INR", "PROTIME" in the last 168 hours. Cardiac Enzymes: No results for input(s): "CKTOTAL", "CKMB", "CKMBINDEX", "TROPONINI" in the last 168 hours. BNP (last 3 results) No results for input(s): "PROBNP" in the last 8760 hours. HbA1C: No results for input(s): "HGBA1C" in the last 72 hours. CBG: Recent Labs  Lab 01/16/22 2108 01/17/22 0046 01/17/22 0537 01/17/22 0810 01/17/22 1128  GLUCAP 126* 111* 115* 127* 120*    Lipid Profile: Recent Labs    01/17/22 0536  TRIG 39    Thyroid Function Tests: No results for input(s): "TSH", "T4TOTAL", "FREET4", "T3FREE", "THYROIDAB" in the last 72 hours. Anemia Panel: No results for input(s): "VITAMINB12", "FOLATE", "FERRITIN", "TIBC", "IRON", "RETICCTPCT" in the last 72 hours. Sepsis Labs: Recent Labs  Lab 01/10/22 1535 01/11/22 0806 01/11/22 0948  LATICACIDVEN 1.0 1.2 1.1     Recent Results (from the past 240 hour(s))  Urine Culture     Status: Abnormal   Collection Time: 01/07/22  2:55 PM   Specimen: Urine, Clean Catch  Result Value Ref Range Status   Specimen Description   Final    URINE, CLEAN CATCH Performed at [facility], [address] [place], [place] [address]    Special Requests   Final    NONE Performed at [facility], [address], [place], [place] [address]    Culture (A)  Final    >=100,000 COLONIES/mL MULTIPLE SPECIES PRESENT, SUGGEST RECOLLECTION   Report Status 01/09/2022 FINAL  Final  Surgical pcr screen     Status: None   Collection Time: 01/10/22  6:20 AM   Specimen: Nasal Mucosa; Nasal Swab  Result Value Ref Range Status   MRSA, PCR NEGATIVE NEGATIVE Final   Staphylococcus aureus NEGATIVE NEGATIVE Final     Comment: (NOTE) The Xpert SA Assay (  FDA approved for NASAL specimens in patients 21 years of age and older), is one component of a comprehensive surveillance program. It is not intended to diagnose infection nor to guide or monitor treatment. Performed at [facility], [address] [place], [place] [address]   Culture, blood (Routine X 2) w Reflex to ID Panel     Status: None   Collection Time: 01/10/22 12:55 PM   Specimen: BLOOD  Result Value Ref Range Status   Specimen Description BLOOD LEFT ANTECUBITAL  Final   Special Requests   Final    BOTTLES DRAWN AEROBIC AND ANAEROBIC Blood Culture results may not be optimal due to an excessive volume of blood received in culture bottles   Culture   Final    NO GROWTH 5 DAYS Performed at [facility], [address], [place], [place] [address]    Report Status 01/15/2022 FINAL  Final  Culture, blood (Routine X 2) w Reflex to ID Panel     Status: None   Collection Time: 01/10/22 12:55 PM   Specimen: BLOOD  Result Value Ref Range Status   Specimen Description BLOOD BLOOD RIGHT HAND  Final   Special Requests   Final    BOTTLES DRAWN AEROBIC ONLY Blood Culture adequate volume   Culture   Final    NO GROWTH 5 DAYS Performed at [facility], [address] [place], [place] [address]    Report Status 01/15/2022 FINAL  Final  MRSA Next Gen by PCR, Nasal     Status: None   Collection Time: 01/10/22  1:11 PM   Specimen: Nasal Mucosa; Nasal Swab  Result Value Ref Range Status   MRSA by PCR Next Gen NOT DETECTED NOT DETECTED Final    Comment: (NOTE) The GeneXpert MRSA Assay (FDA approved for NASAL specimens only), is one component of a comprehensive MRSA colonization surveillance program. It is not intended to diagnose MRSA infection nor to guide or monitor treatment for MRSA infections. Test performance is not FDA approved in patients less than 41 years old. Performed at [facility], [address], [place], [place] [address]   Culture,  Respiratory w Gram Stain     Status: None   Collection Time: 01/12/22  6:14 PM   Specimen: Tracheal Aspirate; Respiratory  Result Value Ref Range Status   Specimen Description TRACHEAL ASPIRATE  Final   Special Requests NONE  Final   Gram Stain NO WBC SEEN NO ORGANISMS SEEN   Final   Culture   Final    RARE Normal respiratory flora-no Staph aureus or Pseudomonas seen Performed at [facility], [address] [employer], [place] [address]    Report Status 01/14/2022 FINAL  Final  MRSA Next Gen by PCR, Nasal     Status: None   Collection Time: 01/12/22  6:50 PM   Specimen: Nasal Mucosa; Nasal Swab  Result Value Ref Range Status   MRSA by PCR Next Gen NOT DETECTED NOT DETECTED Final    Comment: (NOTE) The GeneXpert MRSA Assay (FDA approved for NASAL specimens only), is one component of a comprehensive MRSA colonization surveillance program. It is not intended to diagnose MRSA infection nor to guide or monitor treatment for MRSA infections. Test performance is not FDA approved in patients less than 52 years old. Performed at [facility], [place] [address], [place], [place] [address]          Radiology Studies: DG Abd 2 Views  Result Date: 01/17/2022 CLINICAL DATA:  Abdominal pain EXAM: ABDOMEN - 2 VIEW COMPARISON:  CT 01/09/2022 FINDINGS: Laparotomy skin staples are noted. Multiple gas-filled dilated loops of small bowel are again seen within the mid abdomen suggesting a postoperative ileus or persistent small bowel obstruction. No gas is seen within the large bowel. No gross free intraperitoneal gas. There is central clumping of bowel loops suggesting underlying ascites. Small bilateral pleural effusions are present. Central venous catheter tip noted within the low right atrium. Cholecystectomy clips in the right upper quadrant. IMPRESSION: 1. Persistent small bowel obstruction versus postoperative ileus. 2. Suspected ascites. 3. Small bilateral pleural effusions.  Electronically Signed   By: Fidela Salisbury M.D.   On: 01/17/2022 00:13   CT HEAD WO CONTRAST (5MM)  Result Date: 01/16/2022 CLINICAL DATA:  Provided history: Altered mental status, nontraumatic. EXAM: CT HEAD WITHOUT CONTRAST TECHNIQUE: Contiguous axial images were obtained from the base of the skull through the vertex without intravenous contrast. RADIATION DOSE REDUCTION: This exam was performed according to the departmental dose-optimization program which includes automated exposure control, adjustment of the mA and/or kV according to patient size and/or use of iterative reconstruction technique. COMPARISON:  No pertinent prior exams available for comparison. FINDINGS: Brain: Mild generalized parenchymal atrophy. There is no acute intracranial hemorrhage. No demarcated cortical infarct. No extra-axial fluid collection. No evidence of an intracranial mass. No midline shift. Vascular: No hyperdense vessel. Atherosclerotic calcifications. Skull: No fracture or aggressive osseous lesion. Sinuses/Orbits: No mass or acute finding within the imaged orbits. 18 mm polyp versus mucous retention cyst, and minimal background mucosal thickening, within the left maxillary sinus. 16 mm mucous retention cyst versus polyp within the left frontal sinus. Other: Small-volume fluid within the bilateral mastoid air cells. IMPRESSION: 1. No evidence of acute intracranial abnormality. 2. Mild generalized parenchymal atrophy. 3. Paranasal sinus disease, as described. 4. Small bilateral mastoid effusions. Electronically Signed   By: Kellie Simmering D.O.   On: 01/16/2022 16:30   DG CHEST PORT 1 VIEW  Result Date: 01/16/2022 CLINICAL DATA:  Leukocytosis. EXAM: PORTABLE CHEST 1 VIEW COMPARISON:  Chest 01/12/2022 FINDINGS: Endotracheal tube removed.  NG removed. Left jugular dual lumen catheter tip in the SVC unchanged. Right sided central venous catheter extends into the right atrium with the tip not visualized but appears unchanged  from the prior study Cardiac enlargement with vascular congestion and bilateral airspace disease most compatible with edema. Bilateral pleural effusions and bibasilar atelectasis/edema unchanged. No pneumothorax IMPRESSION: Endotracheal tube removed. Diffuse bilateral airspace disease and bilateral effusions most likely pulmonary edema unchanged. Underlying pneumonia not excluded. Electronically Signed   By: Franchot Gallo M.D.   On: 01/16/2022 15:35        Scheduled Meds:  Chlorhexidine Gluconate Cloth  6 each Topical Daily   [START ON 01/18/2022] Chlorhexidine Gluconate Cloth  6 each Topical Q0600   escitalopram  10 mg Oral Daily   feeding supplement  1 Container Oral TID BM   insulin aspart  0-9 Units Subcutaneous Q6H   metoprolol tartrate  12.5 mg Oral BID   sodium chloride flush  10-40 mL Intracatheter Q12H   sodium chloride flush  3 mL Intravenous Q12H   Continuous Infusions:  sodium chloride Stopped (01/16/22 1822)   sodium chloride Stopped (01/15/22 1255)   sodium chloride     acetaminophen     amiodarone 30 mg/hr (01/17/22 1430)   aztreonam 1 g (01/17/22 1427)   heparin 1,850 Units/hr (01/17/22 1406)   metronidazole 100 mL/hr at 01/17/22 1406   TPN ADULT (  ION) 75 mL/hr at 01/17/22 1406   TPN ADULT (ION)       LOS: 10 days    Time spent: 35 minutes    Damen Windsor A Denee Boeder, MD [facility]   If 7PM-7AM, please contact night-coverage [email or web address]  01/17/2022, 2:43 PM

## 2022-01-17 NOTE — Progress Notes (Signed)
PHARMACY - TOTAL PARENTERAL NUTRITION CONSULT NOTE   Indication: Prolonged ileus  Patient Measurements: Height: 5' 4" (162.6 cm) Weight:  (unable to obtain) IBW/kg (Calculated) : 54.7 TPN AdjBW (KG): 60.7 Body mass index is 35.72 kg/m.  Assessment: 76 yo female with septic shock, high-grade small bowel obstruction. Underwent surgery on 8/17 with ex lap and lysis of adhesions for SBO. Reported vomiting and diarrhea since 8/11 up until surgery on 8/17. Pharmacy consulted for TPN due to prolonged ileus.  Glucose / Insulin: CBG  <180, 2 units mSSI last 24 hours  Electrolytes: Na 132, K 2.6 (s/p 53mq), Mag 1.5, (goal K >4, Mag >2), Phos 2.5 (s/p 448ml), CoCa 8.9 Renal: 8/21 off CRRT now getting IHD  Hepatic: AST/ALT/T.bili wnl, Alk Phos trending down to 128, Alb 1.8, TG WNL  Intake / Output; MIVF: UOP none documented, LBM 8/23 x4  GI Imaging:  8/23: persistent SBO vs. Post-operative ileus, suspected ascites  GI Surgeries / Procedures:  8/17 with ex lap and lysis of adhesions for SBO.   Central access: Triple lumen CVC (placed 8/17) TPN start date: 01/14/22  Nutritional Goals: Estimated Needs Total Energy Estimated Needs: 1700-1900 Total Protein Estimated Needs: 100-120 gm Total Fluid Estimated Needs: 1.7-1.9 L  Current Nutrition:  Boost TID: 1 charted + TPN   Plan:  Continue concentrated TPN.   TPN at rate of 65 mL/hr at 1800- providing 100% of nutrition goals. Providing 110g protein and 1811kcal.  Electrolytes in TPN: Na 5037mL, K 68m20m, Ca 2mEq7m Mg 3mEq/12mand Phos 68mmol34mMax acetate for now. Add standard MVI and trace elements to TPN -removed chromium since pt undergoing dialysis Change SSI to sensitive to q6h and adjust as needed  Kcl x 40mEq a23mag 2g per MD  Monitor TPN labs on Mon/Thurs, and daily for next few days  Celie Desrochers Esmeralda Arthur Clinical Pharmacist 01/17/2022 8:16 AM   Please refer to AMION for pharmacy phone number

## 2022-01-17 NOTE — Progress Notes (Signed)
2230-Pt started complaining of having more severe Abd pain to Right side of Abd and stated after consuming the liquids from dinner tray and boost breeze the pain started. Pt has been A/O x4 and has expressed many questions and concerns with the function of the heart along with medications along with the Abd pain. Notified Dr. Marlyce Huge orders placed to for pt to get an Abd X-ray.   22- Discussed with Dr. Marlyce Huge after results and new orders placed for pt to be NPO with exceptions see chart. Pt also received pain medication Fentanyl 12.5 mcg IVP.   0210- Labs obtained on pt earlier reported to nurse with potassium 2.6 and Magnesium 1.5.  0230-Notified Dr. Marlyce Huge of lab results and orders were placed to replace electrolytes. See chart for order details.   0445- Pt has been resting since pain medication given. Daughter in law has been at bedside since start of shift.

## 2022-01-17 NOTE — Progress Notes (Signed)
[facility] for Heparin Indication: atrial fibrillation  Allergies  Allergen Reactions   Celebrex [Celecoxib] Swelling   Keflet [Cephalexin] Swelling   Penicillins Hives and Swelling    DID THE REACTION INVOLVE: Swelling of the face/tongue/throat, SOB, or low BP? Yes-swelling-hives Sudden or severe rash/hives, skin peeling, or the inside of the mouth or nose? Unknown Did it require medical treatment? Unknown When did it last happen?    over 10 years   If all above answers are "NO", may proceed with cephalosporin use.    Sulfa Antibiotics Swelling   Kenalog [Triamcinolone Acetonide]     unknown   Latex    Lisinopril Other (See Comments)    unknown   Mobic [Meloxicam]     SWELLING    Patient Measurements: Height: '5\' 4"'$  (162.6 cm) Weight:  (unable to obtain) IBW/kg (Calculated) : 54.7 Heparin Dosing Weight: 71.5 kg  Vital Signs: Temp: 97.9 F (36.6 C) (08/24 1145) Temp Source: Oral (08/24 1145) BP: 126/73 (08/24 1145) Pulse Rate: 84 (08/24 1108)  Labs: Recent Labs    01/15/22 0431 01/15/22 1251 01/16/22 0627 01/16/22 0858 01/17/22 0028 01/17/22 0115 01/17/22 0536 01/17/22 1100  HGB 8.4*  --  8.8*  --   --   --  8.3*  --   HCT 26.8*  --  28.2*  --   --   --  25.8*  --   PLT 185  --  202  --   --   --  218  --   HEPARINUNFRC 0.21*   < >  --  0.29* 0.21*  --   --  0.30  CREATININE 2.09*  --  2.75*  --   --  2.08*  --  2.33*   < > = values in this interval not displayed.    Estimated Creatinine Clearance: 23.3 mL/min (A) (by C-G formula based on SCr of 2.33 mg/dL (H)).   Assessment: Pharmacy consulted to dose heparin infusion for post-op afib. Patient was not on any AC PTA.  She is noted s/p ex lap and on TPN.  -heparin level 0.3 on 1750 units/hr, hg= 8.3  Goal of Therapy:  Heparin level 0.3-0.7 units/ml Monitor platelets by anticoagulation protocol: Yes   Plan:  Increase heparin to 1850 units/hr Heparin level and CBC  in am  Hildred Laser, PharmD Clinical Pharmacist **Pharmacist phone directory can now be found on [email or web address] ([email or web address]).  Listed under [place].

## 2022-01-17 NOTE — Consult Note (Signed)
Palliative Medicine Inpatient Consult Note  Consulting Provider: Rexene Agent, MD  Reason for consult:   [facility] Consult  Reason for Consult? [place]   01/17/2022  HPI:  [name]  is a 76 yo female with a PMHx: COPD, HLD, HTN, PE, hypothyroidism, OA and depression. Admitted 8/14 to [facility] with vomiting and diarrhea with partial SBO. 8/17 intubated and ex lap. 8/19 pt with worsening renal function, transfer to [facility] and CRRT started. 8/21 extubation with CRRT off.  Palliative care has been asked to get involved to have goals of care conversations in the setting of a prolonged and complicated hospitalization.  Clinical Assessment/Goals of Care:  *Please note that this is a verbal dictation therefore any spelling or grammatical errors are due to the "[place]" system interpretation.  I have reviewed medical records including EPIC notes, labs and imaging, received report from bedside RN, assessed the patient who is lying in bed oriented though slow to respond. Complains of restless legs.    I met with [name] to further discuss diagnosis prognosis, GOC, EOL wishes, disposition and options.   I introduced Palliative Medicine as specialized medical care for people living with serious illness. It focuses on providing relief from the symptoms and stress of a serious illness. The goal is to improve quality of life for both the patient and the family.  Medical History Review and Understanding:  A discussion of [name]'s medical conditions was had in the setting of her osteoarthritis, COPD, hypertension, prior PE, hypothyroidism, and hospitalization in the setting of a small bowel obstruction. [name] shares that she has had issues in her life with constipation though felt she was on a good regiment of going to the bathroom about every 3 days.  Social History:  [name] lives in [place], [place].  She is a widow as her husband passed  away 15 years ago for brain tumor.  She has 1 son, [name] and 1 daughter Estill Bamberg.  She has 2 grandchildren.  She formally worked as a Agricultural engineer.  She shares that she is a woman who loves plants and has quite the green thumb.  We reviewed that she has strong faith and is part of the [facility] denomination.  Functional and Nutritional State:  [name] lives with her daughter, Estill Bamberg.  She shares that her daughter has some mental health issues which is why they live together.  Known expresses that prior to her present state she did have a decent appetite.  Advance Directives:  A detailed discussion was had today regarding advanced directives.  Patient shares that in the past she has completed a living will though she is not sure where that is located.  She endorses that her son, [name] would be her surrogate decision maker.  Code Status:  Discussed patient's cardiopulmonary resuscitation status - [name] shares that she would not desire to be on life support.  We discussed in more detail what a DO NOT RESUSCITATE means. She states that she believes that is what she would want for further conversations can be held with both she and her son.  Discussion:  A review of patient's present clinical state in the setting of her complicated hospitalization requiring surgical intervention complicated by the need for dialysis and furthermore her ongoing atrial fibrillation was held.  Patient is clear about her goals at this time for improvement  Discussed the importance of continued conversation with family and their  medical providers regarding overall plan of care  and treatment options, ensuring decisions are within the context of the patients values and GOCs. _______________________________  I spoke to patient's son, [name] this afternoon.  We discussed the above conversation and his mother's complicated hospitalization.  He shares that per conversation with the cardiology team it is anticipated that his mother  will take some time to improve though per what he understands there is all optimism.  I shared that my job is to discuss best case and worst-case scenarios as well as how to direct her goals moving forward in the setting of the what if's of each patient's clinical scenario.  We discussed that patients goals are for improvement which we will continue to support though if there are any roadblocks or anything which would indicate otherwise open and honest conversations would be held at that point in time.  I shared the importance of ongoing conversations regarding cardiopulmonary resuscitation status as I know it is a scary topic though it is an important one.[name] shares he has had conflicting information from his nurse yesterday and the medical team and expresses his frustration in the setting of this.  I asked him that if a situation such as this occurs again to please call either the palliative team directly or the patient's attending for clarification of questions at hand.  Decision Maker: Schecter,Justin Son [phone]     SUMMARY OF RECOMMENDATIONS   FULL CODE --> for the time being patient like to discuss this further with her son  I have requested a copy of patient's living will  Watchful waiting  Patient's goals are for improvement --> has been told recovery will be slow and steady  Ongoing palliative care support  Code Status/Advance Care Planning: FULL CODE  Palliative Prophylaxis:  Aspiration, Bowel Regimen, Delirium Protocol, Frequent Pain Assessment, Oral Care, Palliative Wound Care, and Turn Reposition  Additional Recommendations (Limitations, Scope, Preferences): Continue current scope of care-goals for improvement  Psycho-social/Spiritual:  Desire for further Chaplaincy support: Not presently Additional Recommendations: Education on muscular deconditioning.    Prognosis: Increased 12 month mortality risk - prolonged hospital sta\y and multiple chronic  co-morbidities.   Discharge Planning: Discharge plan is uncertain.   Vitals:   01/17/22 1108 01/17/22 1145  BP: 127/84 126/73  Pulse: 84   Resp:  (!) 25  Temp:  97.9 F (36.6 C)  SpO2:  100%    Intake/Output Summary (Last 24 hours) at 01/17/2022 1455 Last data filed at 01/17/2022 1406 Gross per 24 hour  Intake 4468.87 ml  Output --  Net 4468.87 ml   Last Weight  Most recent update: 01/15/2022  5:51 AM    Weight  94.4 kg (208 lb 1.8 oz)            Gen: Elderly Caucasian female HEENT: moist mucous membranes CV: Regular rate and rhythm  PULM:  On 3LPM [place], breathing is even and nonlabored ABD: soft/nontender  EXT: (+) LE edema  Neuro: Alert and oriented x2  PPS: 40%   This conversation/these recommendations were discussed with patient primary care team, Dr. Tyrell Antonio  Billing based on MDM: High  Problems Addressed: One acute or chronic illness or injury that poses a threat to life or bodily function  Amount and/or Complexity of Data: Category 3:Discussion of management or test interpretation with external physician/other qualified health care professional/appropriate source (not separately reported)  Risks: Decision not to resuscitate or to de-escalate care because of poor prognosis ______________________________________________________ [place] Team Team Cell Phone: [phone]  Please utilize secure chat with additional questions, if there is no response within 30 minutes please call the above phone number  Palliative Medicine Team providers are available by phone from 7am to 7pm daily and can be reached through the team cell phone.  Should this patient require assistance outside of these hours, please call the patient's attending physician.

## 2022-01-17 NOTE — Progress Notes (Signed)
Patient ID: [name], female   DOB: May 25, 1946, 76 y.o.   MRN: [id number] [facility] Progress Note   Subjective:    Ongoing sig ectopy Poorly tolerated HD She did not have dysequilibrium, it wasn't her first tx, pre HD BUN was 43 Abd pain, ilieus/SBO persists Remains debilitated, not improving Low K and P, repleted On TPN    Objective:   BP 126/73 (BP Location: Left Arm)   Pulse 84   Temp 97.9 F (36.6 C) (Oral)   Resp (!) 25   Ht '5\' 4"'$  (1.626 m)   Wt 94.4 kg   SpO2 100%   BMI 35.72 kg/m    Physical Exam: Gen: Frail and debilitated, I nbed CVS: Pulse regular rhythm, normal rate, S1 and S2 normal Resp: diminished effort Abd: Soft, honeycomb dressing in place, no drainage noted Ext: Trace bilateral lower extremity edema with lichenification of skin noted, poor skin turgo L IJ Temp Cath C/D/I   Assessment/ Plan:   1. Dialysis dependent anuric Acute kidney Injury -  baseline renal function essentially normal with a creatinine of 1.09 anuric ischemic ATN from high-grade small bowel obstruction, likely third spacing and sustained renal hypoperfusion.  She was on ARB prior to admission.   Off pressors CRRT stop 01/14/22 HD first Tx 8/23, did marginally Next tx 8/23: 4K 3h, 1L UF, no heparin Not bouncin back, any recovery will be slow, will involve palliative care  2.  Septic shock - high-grade small bowel obstruction s/p exploratory laparotomy with adhesiolysis.  Off pressors  3.  High-grade small bowel obstruction: Status post exploratory laparotomy by Dr. Constance Haw with lysis of additions.  CCS following. AROBF  4.  Anion gap metabolic acidosis: Resolved.    5. Hypophosphatemia: nutritional, addign some into TPN  6. Hypokalemia Nutritional GI losses, as above   Recent Labs  Lab 01/15/22 0431 01/16/22 0627 01/17/22 0115 01/17/22 0536  HGB 8.4* 8.8*  --  8.3*  ALBUMIN 1.8* 1.8*  --   --   CALCIUM 7.4* 7.7* 7.1*  --   PHOS 2.0* 1.4* 2.6 2.5   CREATININE 2.09* 2.75* 2.08*  --   K 3.3* 3.3* 2.6*  --      Medications:     Chlorhexidine Gluconate Cloth  6 each Topical Daily   feeding supplement  1 Container Oral TID BM   insulin aspart  0-9 Units Subcutaneous Q6H   metoprolol tartrate  12.5 mg Oral BID   sodium chloride flush  10-40 mL Intracatheter Q12H   sodium chloride flush  3 mL Intravenous Q12H   Rexene Agent, MD  [facility]

## 2022-01-17 NOTE — Progress Notes (Signed)
Patient ID: [name], female   DOB: [birth year], 76 y.o.   MRN: [id number]   Acute Care Surgery Service Progress Note:    Chief Complaint/Subjective: Patient apparently had severe abdominal pain overnight, but she didn't tell me about it this morning.  Currently NPO.  Still having a lot of diarrhea, at least 4 large episodes yesterday and currently laying in one right now.  Objective: Vital signs in last 24 hours: Temp:  [97.9 F (36.6 C)-98.1 F (36.7 C)] 97.9 F (36.6 C) (08/24 1145) Pulse Rate:  [71-93] 84 (08/24 1108) Resp:  [19-25] 25 (08/24 1145) BP: (114-127)/(53-84) 126/73 (08/24 1145) SpO2:  [93 %-100 %] 100 % (08/24 1145) Last BM Date : 01/15/22  Intake/Output from previous day: 08/23 0701 - 08/24 0700 In: 3544.6 [I.V.:3030.8; IV Piggyback:513.9] Out: 1  Intake/Output this shift: No intake/output data recorded.  PE: Lungs: normal effort on [place] Cardiovascular: irregular Abd: soft, ND, honeycomb dressing c/d/I; overall minimally tender on exam today still Extremities: BUE and BLE edema   Lab Results: CBC  Recent Labs    01/16/22 0627 01/17/22 0536  WBC 19.5* 20.2*  HGB 8.8* 8.3*  HCT 28.2* 25.8*  PLT 202 218   BMET Recent Labs    01/17/22 0115 01/17/22 1100  NA 132* 131*  K 2.6* 3.1*  CL 102 102  CO2 22 22  GLUCOSE 112* 120*  BUN 34* 41*  CREATININE 2.08* 2.33*  CALCIUM 7.1* 7.3*   LFT    Latest Ref Rng & Units 01/16/2022    6:27 AM 01/15/2022    4:31 AM 01/14/2022    4:15 PM  Hepatic Function  Total Protein 6.5 - 8.1 g/dL 4.1  4.1    Albumin 3.5 - 5.0 g/dL 1.8  1.8  1.8   AST 15 - 41 U/L 25  34    ALT 0 - 44 U/L 17  20    Alk Phosphatase 38 - 126 U/L 128  155    Total Bilirubin 0.3 - 1.2 mg/dL 0.6  0.7     PT/INR No results for input(s): "LABPROT", "INR" in the last 72 hours. ABG Recent Labs    01/16/22 1406  PHART 7.49*  HCO3 27.4    Studies/Results:  Anti-infectives: Anti-infectives (From admission, onward)    Start      Dose/Rate Route Frequency Ordered Stop   01/16/22 2000  aztreonam (AZACTAM) 1 g in sodium chloride 0.9 % 100 mL IVPB        1 g 200 mL/hr over 30 Minutes Intravenous Every 8 hours 01/16/22 1840     01/16/22 2000  metroNIDAZOLE (FLAGYL) IVPB 500 mg        500 mg 100 mL/hr over 60 Minutes Intravenous Every 8 hours 01/16/22 1840     01/15/22 0526  aztreonam (AZACTAM) 1 g in sodium chloride 0.9 % 100 mL IVPB  Status:  Discontinued       See Hyperspace for full Linked Orders Report.   1 g 200 mL/hr over 30 Minutes Intravenous Every 24 hours 01/14/22 1529 01/14/22 1534   01/15/22 0526  aztreonam (AZACTAM) 1 g in sodium chloride 0.9 % 100 mL IVPB       See Hyperspace for full Linked Orders Report.   1 g 200 mL/hr over 30 Minutes Intravenous Every 24 hours 01/14/22 1534 01/15/22 0504   01/13/22 2100  vancomycin (VANCOCIN) IVPB 1000 mg/200 mL premix  Status:  Discontinued        1,000 mg 200 mL/hr over  60 Minutes Intravenous Every 24 hours 01/12/22 2135 01/13/22 1027   01/13/22 0500  aztreonam (AZACTAM) 2 g in sodium chloride 0.9 % 100 mL IVPB  Status:  Discontinued       See Hyperspace for full Linked Orders Report.   2 g 200 mL/hr over 30 Minutes Intravenous Every 12 hours 01/12/22 2135 01/14/22 1529   01/12/22 2015  vancomycin (VANCOREADY) IVPB 1750 mg/350 mL        1,750 mg 175 mL/hr over 120 Minutes Intravenous  Once 01/12/22 1921 01/12/22 2259   01/11/22 1800  aztreonam (AZACTAM) 1 g in sodium chloride 0.9 % 100 mL IVPB  Status:  Discontinued       See Hyperspace for full Linked Orders Report.   1 g 200 mL/hr over 30 Minutes Intravenous Every 8 hours 01/11/22 0815 01/12/22 2135   01/11/22 1000  levofloxacin (LEVAQUIN) IVPB 500 mg  Status:  Discontinued        500 mg 100 mL/hr over 60 Minutes Intravenous Every 48 hours 01/10/22 1432 01/11/22 0744   01/11/22 0915  aztreonam (AZACTAM) 2 g in sodium chloride 0.9 % 100 mL IVPB       See Hyperspace for full Linked Orders Report.   2 g 200  mL/hr over 30 Minutes Intravenous  Once 01/11/22 0815 01/11/22 1045   01/11/22 0845  aztreonam (AZACTAM) 2 g in sodium chloride 0.9 % 100 mL IVPB  Status:  Discontinued        2 g 200 mL/hr over 30 Minutes Intravenous  Once 01/11/22 0747 01/11/22 0815   01/11/22 0845  metroNIDAZOLE (FLAGYL) IVPB 500 mg  Status:  Discontinued        500 mg 100 mL/hr over 60 Minutes Intravenous Every 12 hours 01/11/22 0747 01/15/22 0851   01/10/22 1900  levofloxacin (LEVAQUIN) IVPB 500 mg  Status:  Discontinued        500 mg 100 mL/hr over 60 Minutes Intravenous Every 24 hours 01/10/22 1426 01/10/22 1432   01/10/22 0700  ciprofloxacin (CIPRO) IVPB 400 mg        400 mg 200 mL/hr over 60 Minutes Intravenous On call to O.R. 01/10/22 0612 01/10/22 1052   01/10/22 0700  metroNIDAZOLE (FLAGYL) IVPB 500 mg  Status:  Discontinued        500 mg 100 mL/hr over 60 Minutes Intravenous On call to O.R. 01/10/22 0612 01/10/22 1123   01/08/22 0830  Levofloxacin (LEVAQUIN) IVPB 250 mg  Status:  Discontinued        250 mg 50 mL/hr over 60 Minutes Intravenous Every 24 hours 01/08/22 0724 01/08/22 0727   01/08/22 0830  Levofloxacin (LEVAQUIN) IVPB 250 mg  Status:  Discontinued        250 mg 50 mL/hr over 60 Minutes Intravenous Every 48 hours 01/08/22 0727 01/08/22 0732   01/08/22 0830  Levofloxacin (LEVAQUIN) IVPB 250 mg  Status:  Discontinued        250 mg 50 mL/hr over 60 Minutes Intravenous Every 24 hours 01/08/22 0732 01/10/22 1426       Medications: Scheduled Meds:  Chlorhexidine Gluconate Cloth  6 each Topical Daily   [START ON 01/18/2022] Chlorhexidine Gluconate Cloth  6 each Topical Q0600   feeding supplement  1 Container Oral TID BM   insulin aspart  0-9 Units Subcutaneous Q6H   metoprolol tartrate  12.5 mg Oral BID   sodium chloride flush  10-40 mL Intracatheter Q12H   sodium chloride flush  3 mL Intravenous Q12H  Continuous Infusions:  sodium chloride 250 mL (01/15/22 0747)   sodium chloride Stopped  (01/15/22 1255)   sodium chloride     amiodarone 30 mg/hr (01/17/22 0215)   aztreonam 1 g (01/17/22 0626)   heparin 1,750 Units/hr (01/17/22 0233)   metronidazole 500 mg (01/17/22 0459)   TPN ADULT (ION) 75 mL/hr at 01/16/22 1724   TPN ADULT (ION)     PRN Meds:.sodium chloride, Place/Maintain arterial line **AND** sodium chloride, acetaminophen, albuterol, alteplase, HYDROcodone-acetaminophen, lip balm, [DISCONTINUED] ondansetron **OR** ondansetron (ZOFRAN) IV, mouth rinse, sodium chloride, sodium chloride flush, sodium chloride flush  Assessment/Plan: POD#7 - status post ex lap with lysis of adhesions - Dr. Constance Haw ([facility]) - Afebrile WBC 19  -abdominal exam is relatively benign currently, but given rising WBC and severe pain overnight, will CT scan her abdomen this morning to rule out post op complication -ok to remain NPO for now. -may add flexi-seal if needed for diarrhea.  May also add gerhardt's butt cream if cleared by pharmacy due to an allergy.  D/w nursing.  -extubated 8/21 -SLP following -continue full support TPN  FEN: NPO, TPN, replacement of electrolytes per renal and primary ID: aztreonam 8/18 >>8/22, will hold still for now unless CT shows evidence of infection. VTE: hep gtt Foley: purewick in place   Afib - IV hep, rate control meds per cards AKI - CRRT stopped 8/21, now with IHD ABL anemia - stable   Data reviewed - vitals x 24 hrs, nursing note x 24hrs, CCM note, renal note, hospitalist note, and cardiology note, labs, imaging, I/Os   LOS: 10 days   Henreitta Cea, PA-C General & Trauma Surgery [phone] [facility]

## 2022-01-18 ENCOUNTER — Inpatient Hospital Stay (HOSPITAL_COMMUNITY): Payer: Medicare Other

## 2022-01-18 ENCOUNTER — Encounter (HOSPITAL_COMMUNITY)
Admission: EM | Disposition: A | Discharge status: Nursing Home (Includes ICF) | Payer: Self-pay | Source: Home / Self Care | Attending: Student

## 2022-01-18 ENCOUNTER — Encounter (HOSPITAL_COMMUNITY): Payer: Self-pay | Admitting: Internal Medicine

## 2022-01-18 DIAGNOSIS — R6521 Severe sepsis with septic shock: Secondary | ICD-10-CM | POA: Diagnosis not present

## 2022-01-18 DIAGNOSIS — Z7189 Other specified counseling: Secondary | ICD-10-CM | POA: Diagnosis not present

## 2022-01-18 DIAGNOSIS — Z515 Encounter for palliative care: Secondary | ICD-10-CM | POA: Diagnosis not present

## 2022-01-18 DIAGNOSIS — A419 Sepsis, unspecified organism: Secondary | ICD-10-CM | POA: Diagnosis not present

## 2022-01-18 HISTORY — PX: THORACENTESIS: SHX235

## 2022-01-18 LAB — MAGNESIUM: Magnesium: 1.8 mg/dL (ref 1.7–2.4)

## 2022-01-18 LAB — CBC
HCT: 24.7 % — ABNORMAL LOW (ref 36.0–46.0)
Hemoglobin: 7.9 g/dL — ABNORMAL LOW (ref 12.0–15.0)
MCH: 28.4 pg (ref 26.0–34.0)
MCHC: 32 g/dL (ref 30.0–36.0)
MCV: 88.8 fL (ref 80.0–100.0)
Platelets: 259 10*3/uL (ref 150–400)
RBC: 2.78 MIL/uL — ABNORMAL LOW (ref 3.87–5.11)
RDW: 18.7 % — ABNORMAL HIGH (ref 11.5–15.5)
WBC: 19.1 10*3/uL — ABNORMAL HIGH (ref 4.0–10.5)
nRBC: 0 % (ref 0.0–0.2)

## 2022-01-18 LAB — HEPARIN LEVEL (UNFRACTIONATED): Heparin Unfractionated: 0.63 IU/mL (ref 0.30–0.70)

## 2022-01-18 LAB — BASIC METABOLIC PANEL
Anion gap: 3 — ABNORMAL LOW (ref 5–15)
Anion gap: 5 (ref 5–15)
BUN: 54 mg/dL — ABNORMAL HIGH (ref 8–23)
BUN: 39 mg/dL — ABNORMAL HIGH (ref 8–23)
CO2: 21 mmol/L — ABNORMAL LOW (ref 22–32)
CO2: 25 mmol/L (ref 22–32)
Calcium: 7.1 mg/dL — ABNORMAL LOW (ref 8.9–10.3)
Calcium: 7.1 mg/dL — ABNORMAL LOW (ref 8.9–10.3)
Chloride: 102 mmol/L (ref 98–111)
Chloride: 100 mmol/L (ref 98–111)
Creatinine, Ser: 2.57 mg/dL — ABNORMAL HIGH (ref 0.44–1.00)
Creatinine, Ser: 1.92 mg/dL — ABNORMAL HIGH (ref 0.44–1.00)
GFR, Estimated: 19 mL/min — ABNORMAL LOW (ref >60)
GFR, Estimated: 27 mL/min — ABNORMAL LOW (ref >60)
Glucose, Bld: 112 mg/dL — ABNORMAL HIGH (ref 70–99)
Glucose, Bld: 145 mg/dL — ABNORMAL HIGH (ref 70–99)
Potassium: 3.1 mmol/L — ABNORMAL LOW (ref 3.5–5.1)
Potassium: 3.3 mmol/L — ABNORMAL LOW (ref 3.5–5.1)
Sodium: 126 mmol/L — ABNORMAL LOW (ref 135–145)
Sodium: 130 mmol/L — ABNORMAL LOW (ref 135–145)

## 2022-01-18 LAB — GLUCOSE, CAPILLARY
Glucose-Capillary: 107 mg/dL — ABNORMAL HIGH (ref 70–99)
Glucose-Capillary: 105 mg/dL — ABNORMAL HIGH (ref 70–99)
Glucose-Capillary: 124 mg/dL — ABNORMAL HIGH (ref 70–99)

## 2022-01-18 LAB — PHOSPHORUS: Phosphorus: 3.2 mg/dL (ref 2.5–4.6)

## 2022-01-18 SURGERY — THORACENTESIS
Anesthesia: LOCAL

## 2022-01-18 MED ORDER — POTASSIUM CHLORIDE 10 MEQ/50ML IV SOLN
10.0000 meq | INTRAVENOUS | Status: AC
  Administered 2022-01-18 (×3): 10 meq via INTRAVENOUS
  Filled 2022-01-18 (×3): qty 50

## 2022-01-18 MED ORDER — TRACE MINERALS CU-MN-SE-ZN 300-55-60-3000 MCG/ML IV SOLN
INTRAVENOUS | Status: AC
  Filled 2022-01-18: qty 738.4

## 2022-01-18 MED ORDER — HEPARIN SODIUM (PORCINE) 1000 UNIT/ML IJ SOLN
INTRAMUSCULAR | Status: AC
  Filled 2022-01-18: qty 3

## 2022-01-18 MED ORDER — POTASSIUM CHLORIDE 10 MEQ/50ML IV SOLN
10.0000 meq | INTRAVENOUS | Status: AC
  Administered 2022-01-18 – 2022-01-19 (×3): 10 meq via INTRAVENOUS
  Filled 2022-01-18 (×3): qty 50

## 2022-01-18 MED ORDER — MAGNESIUM SULFATE 2 GM/50ML IV SOLN: 2.0000 g | Freq: Once | INTRAVENOUS | Status: DC

## 2022-01-18 MED ORDER — MAGNESIUM SULFATE IN D5W 1-5 GM/100ML-% IV SOLN
1.0000 g | Freq: Once | INTRAVENOUS | Status: AC
Start: 2022-01-18 — End: 2022-01-18
  Administered 2022-01-18: 1 g via INTRAVENOUS
  Filled 2022-01-18: qty 100

## 2022-01-18 NOTE — Progress Notes (Signed)
Patient ID: [name], female   DOB: 28-Dec-1945, 76 y.o.   MRN: [id number]   Acute Care Surgery Service Progress Note:    Chief Complaint/Subjective: No new complaints today.  Still with diarrhea, but states she is hungry today.  No significant abdominal pain.  No nausea  Objective: Vital signs in last 24 hours: Temp:  [97.4 F (36.3 C)-97.8 F (36.6 C)] 97.6 F (36.4 C) (08/25 0801) Pulse Rate:  [45-74] 45 (08/25 1200) Resp:  [13-23] 13 (08/25 1200) BP: (98-133)/(59-77) 98/66 (08/25 1200) SpO2:  [98 %-100 %] 100 % (08/25 1200) Last BM Date : 01/16/22  Intake/Output from previous day: 08/24 0701 - 08/25 0700 In: 964.3 [I.V.:788.1; IV Piggyback:136.2] Out: 45 [Stool:45] Intake/Output this shift: Total I/O In: 1948.9 [I.V.:1223.3; IV Piggyback:725.6] Out: -   PE: Lungs: normal effort on [place] Abd: soft, ND, honeycomb dressing c/d/I; overall minimally tender on exam  Extremities: BUE and BLE edema   Lab Results: CBC  Recent Labs    01/17/22 0536 01/18/22 0707  WBC 20.2* 19.1*  HGB 8.3* 7.9*  HCT 25.8* 24.7*  PLT 218 259   BMET Recent Labs    01/17/22 1100 01/18/22 0707  NA 131* 126*  K 3.1* 3.1*  CL 102 102  CO2 22 21*  GLUCOSE 120* 112*  BUN 41* 54*  CREATININE 2.33* 2.57*  CALCIUM 7.3* 7.1*   LFT    Latest Ref Rng & Units 01/16/2022    6:27 AM 01/15/2022    4:31 AM 01/14/2022    4:15 PM  Hepatic Function  Total Protein 6.5 - 8.1 g/dL 4.1  4.1    Albumin 3.5 - 5.0 g/dL 1.8  1.8  1.8   AST 15 - 41 U/L 25  34    ALT 0 - 44 U/L 17  20    Alk Phosphatase 38 - 126 U/L 128  155    Total Bilirubin 0.3 - 1.2 mg/dL 0.6  0.7     PT/INR No results for input(s): "LABPROT", "INR" in the last 72 hours. ABG Recent Labs    01/16/22 1406  PHART 7.49*  HCO3 27.4    Studies/Results:  Anti-infectives: Anti-infectives (From admission, onward)    Start     Dose/Rate Route Frequency Ordered Stop   01/16/22 2000  aztreonam (AZACTAM) 1 g in sodium chloride 0.9  % 100 mL IVPB        1 g 200 mL/hr over 30 Minutes Intravenous Every 8 hours 01/16/22 1840     01/16/22 2000  metroNIDAZOLE (FLAGYL) IVPB 500 mg        500 mg 100 mL/hr over 60 Minutes Intravenous Every 8 hours 01/16/22 1840     01/15/22 0526  aztreonam (AZACTAM) 1 g in sodium chloride 0.9 % 100 mL IVPB  Status:  Discontinued       See Hyperspace for full Linked Orders Report.   1 g 200 mL/hr over 30 Minutes Intravenous Every 24 hours 01/14/22 1529 01/14/22 1534   01/15/22 0526  aztreonam (AZACTAM) 1 g in sodium chloride 0.9 % 100 mL IVPB       See Hyperspace for full Linked Orders Report.   1 g 200 mL/hr over 30 Minutes Intravenous Every 24 hours 01/14/22 1534 01/15/22 0504   01/13/22 2100  vancomycin (VANCOCIN) IVPB 1000 mg/200 mL premix  Status:  Discontinued        1,000 mg 200 mL/hr over 60 Minutes Intravenous Every 24 hours 01/12/22 2135 01/13/22 1027   01/13/22 0500  aztreonam (AZACTAM) 2 g in sodium chloride 0.9 % 100 mL IVPB  Status:  Discontinued       See Hyperspace for full Linked Orders Report.   2 g 200 mL/hr over 30 Minutes Intravenous Every 12 hours 01/12/22 2135 01/14/22 1529   01/12/22 2015  vancomycin (VANCOREADY) IVPB 1750 mg/350 mL        1,750 mg 175 mL/hr over 120 Minutes Intravenous  Once 01/12/22 1921 01/12/22 2259   01/11/22 1800  aztreonam (AZACTAM) 1 g in sodium chloride 0.9 % 100 mL IVPB  Status:  Discontinued       See Hyperspace for full Linked Orders Report.   1 g 200 mL/hr over 30 Minutes Intravenous Every 8 hours 01/11/22 0815 01/12/22 2135   01/11/22 1000  levofloxacin (LEVAQUIN) IVPB 500 mg  Status:  Discontinued        500 mg 100 mL/hr over 60 Minutes Intravenous Every 48 hours 01/10/22 1432 01/11/22 0744   01/11/22 0915  aztreonam (AZACTAM) 2 g in sodium chloride 0.9 % 100 mL IVPB       See Hyperspace for full Linked Orders Report.   2 g 200 mL/hr over 30 Minutes Intravenous  Once 01/11/22 0815 01/11/22 1045   01/11/22 0845  aztreonam (AZACTAM)  2 g in sodium chloride 0.9 % 100 mL IVPB  Status:  Discontinued        2 g 200 mL/hr over 30 Minutes Intravenous  Once 01/11/22 0747 01/11/22 0815   01/11/22 0845  metroNIDAZOLE (FLAGYL) IVPB 500 mg  Status:  Discontinued        500 mg 100 mL/hr over 60 Minutes Intravenous Every 12 hours 01/11/22 0747 01/15/22 0851   01/10/22 1900  levofloxacin (LEVAQUIN) IVPB 500 mg  Status:  Discontinued        500 mg 100 mL/hr over 60 Minutes Intravenous Every 24 hours 01/10/22 1426 01/10/22 1432   01/10/22 0700  ciprofloxacin (CIPRO) IVPB 400 mg        400 mg 200 mL/hr over 60 Minutes Intravenous On call to O.R. 01/10/22 0612 01/10/22 1052   01/10/22 0700  metroNIDAZOLE (FLAGYL) IVPB 500 mg  Status:  Discontinued        500 mg 100 mL/hr over 60 Minutes Intravenous On call to O.R. 01/10/22 0612 01/10/22 1123   01/08/22 0830  Levofloxacin (LEVAQUIN) IVPB 250 mg  Status:  Discontinued        250 mg 50 mL/hr over 60 Minutes Intravenous Every 24 hours 01/08/22 0724 01/08/22 0727   01/08/22 0830  Levofloxacin (LEVAQUIN) IVPB 250 mg  Status:  Discontinued        250 mg 50 mL/hr over 60 Minutes Intravenous Every 48 hours 01/08/22 0727 01/08/22 0732   01/08/22 0830  Levofloxacin (LEVAQUIN) IVPB 250 mg  Status:  Discontinued        250 mg 50 mL/hr over 60 Minutes Intravenous Every 24 hours 01/08/22 0732 01/10/22 1426       Medications: Scheduled Meds:  Chlorhexidine Gluconate Cloth  6 each Topical Daily   Chlorhexidine Gluconate Cloth  6 each Topical Q0600   escitalopram  10 mg Oral Daily   feeding supplement  1 Container Oral TID BM   metoprolol tartrate  12.5 mg Oral BID   sodium chloride flush  10-40 mL Intracatheter Q12H   sodium chloride flush  3 mL Intravenous Q12H   Continuous Infusions:  sodium chloride Stopped (01/16/22 1822)   sodium chloride Stopped (01/15/22 1255)   sodium chloride  acetaminophen 1,000 mg (01/18/22 0650)   amiodarone 30 mg/hr (01/18/22 0146)   aztreonam 1 g  (01/18/22 0754)   heparin 1,850 Units/hr (01/18/22 0657)   magnesium sulfate bolus IVPB     metronidazole 500 mg (01/18/22 0646)   potassium chloride     TPN ADULT (ION) 65 mL/hr at 01/17/22 1734   TPN ADULT (ION)     PRN Meds:.sodium chloride, Place/Maintain arterial line **AND** sodium chloride, acetaminophen, albuterol, alteplase, lip balm, [DISCONTINUED] ondansetron **OR** ondansetron (ZOFRAN) IV, mouth rinse, oxyCODONE, sodium chloride, sodium chloride flush, sodium chloride flush  Assessment/Plan: POD#8 - status post ex lap with lysis of adhesions - Dr. Constance Haw ([facility]) - Afebrile WBC 19 still.  CT scan negative for intra-abdominal infection.  -adv to [facility]D -may add flexi-seal if needed for diarrhea.  May also add gerhardt's butt cream if cleared by pharmacy due to an allergy.  -if lungs not felt to be etiology of possible WBC, could check c diff, but patient has no significant abdominal pain and no fever. -continue full support TPN  FEN: NPO, TPN, replacement of electrolytes per renal and primary ID: aztreonam 8/18 >>8/22, 8/23 restarted and flagyl, no abx needed from surgery standpoint VTE: hep gtt Foley: purewick in place   Afib - IV hep, rate control meds per cards AKI - CRRT stopped 8/21, now with IHD ABL anemia - stable Volume overload/pleural effusions - per medicine   Data reviewed - vitals x 24 hrs, nursing note x 24hrs, CCM note, renal note, hospitalist note, and cardiology note, labs, imaging, I/Os   LOS: 11 days   Henreitta Cea, PA-C General & Trauma Surgery [phone] [facility]

## 2022-01-18 NOTE — Progress Notes (Signed)
Nutrition Follow-up  DOCUMENTATION CODES:   Non-severe (moderate) malnutrition in context of acute illness/injury  INTERVENTION:  TPN management per pharmacy Continue Boost Breeze po TID, each supplement provides 250 kcal and 9 grams of protein  NUTRITION DIAGNOSIS:   Moderate Malnutrition related to acute illness (SBO) as evidenced by energy intake < 75% for > 7 days, mild muscle depletion, moderate fat depletion.  Ongoing  GOAL:   Patient will meet greater than or equal to 90% of their needs  Goal met via TPN  MONITOR:   I & O's, Labs, Skin, Diet advancement  REASON FOR ASSESSMENT:   Consult New TPN/TNA  ASSESSMENT:   76 yo female with septic shock, high-grade small bowel obstruction. 8/17- status post exploratory laparotomy with lysis of adhesions. Acute kidney injury. Per Nephrology-pt anuric and is scheduled for transfer to [facility] for CRRT. 8/17-Right IJ.  8/17: s/p ex-lap, lysis of adhesion 8/21: CRRT d/c; TPN started 8/23: first iHD  Pt off unit at time of visit. Spoke with RN in the hallways. He states that she was unable to have a supplement this morning. Uncertain if she had any yesterday but per review of MAR it does not appear she received any supplements yesterday.  TPN infusing at rate of 65 mL/hr at 1800- providing 100% of nutrition goals. Providing 110g protein and 1811kcal.   Per Surgery, still with diarrhea today. Feeling hungry, diet advanced to clear liquid today. No abdominal pain or nausea noted.  PMT following for ongoing [place] discussions.   Admit weight: 78.7 kg Current weight: 94.4 kg  Edema: deep pitting generalized, moderate pitting RUE, deep pitting LUE/BLE  Medications reviewed  Labs: sodium 126, potassium 3.1, BUN 54, Cr 2.57, anion gap 3, phos 3.2 (wdl), GFR 19  I/O's: +22.8L since admission  Diet Order:   Diet Order             Diet clear liquid Room service appropriate? Yes; Fluid consistency: Thin  Diet effective now                    EDUCATION NEEDS:   Not appropriate for education at this time  Skin:  Skin Assessment: Skin Integrity Issues: Skin Integrity Issues:: Other (Comment) Stage II: coccyx, L side of face Other: PI lumbar, L buttock  Last BM:  8/24 (64ml via FMS)  Height:   Ht Readings from Last 1 Encounters:  01/12/22 $RemoveB'5\' 4"'SHxXYFGH$  (1.626 m)    Weight:   Wt Readings from Last 1 Encounters:  01/15/22 94.4 kg    Ideal Body Weight:  54.5 kg  BMI:  Body mass index is 35.72 kg/m.  Estimated Nutritional Needs:   Kcal:  1700-1900  Protein:  100-120 gm  Fluid:  1L + UOP  Clayborne Dana, RDN, LDN Clinical Nutrition

## 2022-01-18 NOTE — Progress Notes (Signed)
 Palliative Medicine Inpatient Follow Up Note HPI: [name]  is a 76 yo female with a PMHx: COPD, HLD, HTN, PE, hypothyroidism, OA and depression. Admitted 8/14 to [facility] with vomiting and diarrhea with partial SBO. 8/17 intubated and ex lap. 8/19 pt with worsening renal function, transfer to [facility] and CRRT started. 8/21 extubation with CRRT off.  Palliative care has been asked to get involved to have goals of care conversations in the setting of a prolonged and complicated hospitalization.  Today's Discussion 01/18/2022  *Please note that this is a verbal dictation therefore any spelling or grammatical errors are due to the "[employer]agon [employer] One" system interpretation.  Chart reviewed inclusive of vital signs, progress notes, laboratory results, and diagnostic images.   I spoke to patients RN this morning who noted patients BLE pain was better controlled overnight.   I met with Fabiola at bedside this morning.  She was lying in bed in no acute distress, she shares that her pain is better controlled on the tylenol IVPB. We discussed that this will continue for the next day or so. She vocalizes optimism for improvements.Created space and opportunity for patient to explore thoughts feelings and fears regarding current medical situation. We reviewed the importance of mobility at this time given that her muscle are so weak, patients goal for the day is to sit up in the chair which I shared.   I called patients son, Justin who expresses that he spoke to both cardiology and nephrology today. He states that from what he knows, his mother is doing fairly well today.   We reviewed patients resuscitation status and son, Justin agrees with his mothers wishes as vocalizes yesterday. He shares that he plans to come in later. We have agreed to meet when he does for further conversation.   Questions and concerns addressed/Palliative Support Provided.   Objective Assessment: Vital Signs Vitals:   01/18/22 1200  01/18/22 1230  BP: 98/66 106/66  Pulse: (!) 45 65  Resp: 13 13  Temp:    SpO2: 100% 100%    Intake/Output Summary (Last 24 hours) at 01/18/2022 1250 Last data filed at 01/18/2022 0754 Gross per 24 hour  Intake 2913.15 ml  Output 45 ml  Net 2868.15 ml   Last Weight  Most recent update: 01/15/2022  5:51 AM    Weight  94.4 kg (208 lb 1.8 oz)            Gen: Elderly Caucasian female HEENT: moist mucous membranes CV: Regular rate and rhythm  PULM:  On 3LPM [place], breathing is even and nonlabored ABD: soft/nontender  EXT: (+) LE edema  Neuro: Alert and oriented x2  SUMMARY OF RECOMMENDATIONS   DNAR/DNI   I have requested a copy of patient's living will   Watchful waiting   Patient's goals are for improvement --> has been told recovery will be slow and steady   Ongoing palliative care support  Billing based on MDM: High  Problems Addressed: One acute or chronic illness or injury that poses a threat to life or bodily function  Amount and/or Complexity of Data: Category 3:Discussion of management or test interpretation with external physician/other qualified health care professional/appropriate source (not separately reported)  Risks: Decision not to resuscitate or to de-escalate care because of poor prognosis ______________________________________________________________________________________   [place] Palliative Medicine Team Team Cell Phone: [phone] Please utilize secure chat with additional questions, if there is no response within 30 minutes please call the above phone number  Palliative Medicine   Team providers are available by phone from 7am to 7pm daily and can be reached through the team cell phone.  Should this patient require assistance outside of these hours, please call the patient's attending physician.

## 2022-01-18 NOTE — Progress Notes (Signed)
[place] for Heparin Indication: atrial fibrillation  Allergies  Allergen Reactions   Celebrex [Celecoxib] Swelling   Keflet [Cephalexin] Swelling   Penicillins Hives and Swelling    DID THE REACTION INVOLVE: Swelling of the face/tongue/throat, SOB, or low BP? Yes-swelling-hives Sudden or severe rash/hives, skin peeling, or the inside of the mouth or nose? Unknown Did it require medical treatment? Unknown When did it last happen?    over 10 years   If all above answers are "NO", may proceed with cephalosporin use.    Sulfa Antibiotics Swelling   Kenalog [Triamcinolone Acetonide]     unknown   Latex    Lisinopril Other (See Comments)    unknown   Mobic [Meloxicam]     SWELLING    Patient Measurements: Height: '5\' 4"'$  (162.6 cm) Weight:  (unable to obtain) IBW/kg (Calculated) : 54.7  Heparin Dosing Weight: 72 kg  Vital Signs: Temp: 97.6 F (36.4 C) (08/25 0801) Temp Source: Oral (08/25 0801) BP: 98/66 (08/25 1330) Pulse Rate: 74 (08/25 1330)  Labs: Recent Labs    01/16/22 5573 01/16/22 0858 01/17/22 0028 01/17/22 0115 01/17/22 0536 01/17/22 1100 01/18/22 0707  HGB 8.8*  --   --   --  8.3*  --  7.9*  HCT 28.2*  --   --   --  25.8*  --  24.7*  PLT 202  --   --   --  218  --  259  HEPARINUNFRC  --    < > 0.21*  --   --  0.30 0.63  CREATININE 2.75*  --   --  2.08*  --  2.33* 2.57*   < > = values in this interval not displayed.   Estimated Creatinine Clearance: 21.1 mL/min (A) (by C-G formula based on SCr of 2.57 mg/dL (H)).   Assessment: Pharmacy consulted to dose heparin infusion for post-op afib. Patient was not on any AC PTA.  She is noted s/p ex lap and on TPN. Heparin level 0.63 on 1850 units/hr. Hgb 8.3 > 7.9, platelets 259. No issues with bleeding reported.  Goal of Therapy:  Heparin level 0.3-0.7 units/ml Monitor platelets by anticoagulation protocol: Yes   Plan:  Continue heparin infusion at 1850  units/hr Check heparin level daily while on heparin Continue to monitor H&H and platelets    Thank you for allowing pharmacy to be a part of this patient's care.  Ardyth Harps, PharmD Clinical Pharmacist

## 2022-01-18 NOTE — Procedures (Signed)
I was present at this dialysis session. I have reviewed the session itself and made appropriate changes.   1L UF goal on 4K bath. Pt seems to be tolerating much better. Awake, calm, appears in NAD.  No ectopy on monitor.   Filed Weights   01/13/22 1236 01/14/22 0500 01/15/22 0500  Weight: 89.8 kg 90.4 kg 94.4 kg    Recent Labs  Lab 01/18/22 0707  NA 126*  K 3.1*  CL 102  CO2 21*  GLUCOSE 112*  BUN 54*  CREATININE 2.57*  CALCIUM 7.1*  PHOS 3.2    Recent Labs  Lab 01/12/22 0344 01/13/22 0448 01/14/22 0400 01/16/22 0627 01/17/22 0536 01/18/22 0707  WBC 15.9* 14.7*   < > 19.5* 20.2* 19.1*  NEUTROABS 11.2* 12.1*  --   --   --   --   HGB 8.8* 10.2*   < > 8.8* 8.3* 7.9*  HCT 27.7* 31.9*   < > 28.2* 25.8* 24.7*  MCV 87.7 87.6   < > 89.8 88.7 88.8  PLT 137* 144*   < > 202 218 259   < > = values in this interval not displayed.    Scheduled Meds:  Chlorhexidine Gluconate Cloth  6 each Topical Daily   Chlorhexidine Gluconate Cloth  6 each Topical Q0600   escitalopram  10 mg Oral Daily   feeding supplement  1 Container Oral TID BM   metoprolol tartrate  12.5 mg Oral BID   sodium chloride flush  10-40 mL Intracatheter Q12H   sodium chloride flush  3 mL Intravenous Q12H   Continuous Infusions:  sodium chloride Stopped (01/16/22 1822)   sodium chloride Stopped (01/15/22 1255)   sodium chloride     acetaminophen 1,000 mg (01/18/22 0650)   amiodarone 30 mg/hr (01/18/22 0146)   aztreonam 1 g (01/18/22 0754)   heparin 1,850 Units/hr (01/18/22 0657)   magnesium sulfate bolus IVPB     metronidazole 500 mg (01/18/22 0646)   potassium chloride     TPN ADULT (ION) 65 mL/hr at 01/17/22 1734   TPN ADULT (ION)     PRN Meds:.sodium chloride, Place/Maintain arterial line **AND** sodium chloride, acetaminophen, albuterol, alteplase, lip balm, [DISCONTINUED] ondansetron **OR** ondansetron (ZOFRAN) IV, mouth rinse, oxyCODONE, sodium chloride, sodium chloride flush, sodium chloride flush    Pearson Grippe  MD 01/18/2022, 12:20 PM

## 2022-01-18 NOTE — Progress Notes (Signed)
PHARMACY - TOTAL PARENTERAL NUTRITION CONSULT NOTE   Indication: Prolonged ileus  Patient Measurements: Height: 5\' 4"  (162.6 cm) Weight:  (unable to obtain) IBW/kg (Calculated) : 54.7 TPN AdjBW (KG): 60.7 Body mass index is 35.72 kg/m.  Assessment: 76 yo female with septic shock, high-grade small bowel obstruction. Underwent surgery on 8/17 with ex lap and lysis of adhesions for SBO. Reported vomiting and diarrhea since 8/11 up until surgery on 8/17. Pharmacy consulted for TPN due to prolonged ileus.  Glucose / Insulin: CBG  <180, 0 units mSSI last 24 hours  Electrolytes: Na 126, K 3.1 (s/p 75mEq PO on 8/24 PM and 90mEq IV 8/24 AM), Mag 1.8 (s/p 2g on 8/24 AM), (goal K >4, Mag >2), Phos 3.2, CoCa 8.9 Renal: 8/21 off CRRT now getting IHD, next planned HD session today  Hepatic: AST/ALT/T.bili wnl, Alk Phos trending down to 128, Alb 1.8, TG WNL  Intake / Output; MIVF: UOP none documented, stool: -23mL  GI Imaging:  8/24: CT abd: decreasing gas distention, anasarca throughout abdominal wall, distention of lower abdominal/pelvic small bowel loops  8/23: persistent SBO vs. Post-operative ileus, suspected ascites  GI Surgeries / Procedures:  8/17 with ex lap and lysis of adhesions for SBO.   Central access: Triple lumen CVC (placed 8/17) TPN start date: 01/14/22  Nutritional Goals: Estimated Needs Total Energy Estimated Needs: 1700-1900 Total Protein Estimated Needs: 100-120 gm Total Fluid Estimated Needs: 1.7-1.9 L  Current Nutrition:  Boost TID: 0 charted + TPN   Plan:  Continue concentrated TPN.   TPN at rate of 65 mL/hr at 1800- providing 100% of nutrition goals. Providing 110g protein and 1811kcal.  Electrolytes in TPN: Increase Na 58mEq/L, K 59mEq/L, Increase Ca 27mEq/L, Increase Mg 46mEq/L, and Decrease Phos 91mmol/L. Max acetate for now. Add standard MVI and trace elements to TPN -removed chromium since pt undergoing dialysis D/C SSI and checks.  Will give 34mEq IV Kcl and  1g mag to get to goal K > 4 and Mag >2.  Monitor TPN labs on Mon/Thurs, and daily for next few days  Esmeralda Arthur, PharmD Clinical Pharmacist 01/18/2022 8:57 AM   Please refer to [facility] for pharmacy phone number

## 2022-01-18 NOTE — Progress Notes (Addendum)
NAME:  [name], MRN:  [id number], DOB:  07-27-1945, LOS: 59 ADMISSION DATE:  01/07/2022, CONSULTATION DATE:  8/17 REFERRING MD:  Leafy Half, [facility], CHIEF COMPLAINT:  post op circulatory shock/ resp failure    History of Present Illness:  18 yowf never smoker with "COPD" (not supported by pfts)   hyperlipidemia, essential hypertension, hypothyroidism, pulmonary embolism in the past, osteoarthritis, and depression who presents from home with vomiting and diarrhea since 8/11 when  presented with  acute onset of abdominal pain CT scan was obtained and was unremarkable she received some IV fluids and antiemetics and was discharged home.  Her V and D persisted and when  previous treatment did not alleviate her symptoms   she presented to [facility].  CT scan was obtained and found to show a partial small bowel obstruction and significant changes compared to the CT scan she had on 8/11.   ED Course: Patient was given an oral dose of Zofran and some IV fluid drip.  She continued to have persistent nausea in the emergency department and vomited at least 200 mL  witnessed  She was given additional Zofran at that point in her IV.  Also had 1 episode of diarrhea in the emergency department which is described as small vol. Labs were consistent with acute kidney injury and hypokalemia CT scan showed a partial small bowel obstruction.     To OR 8/17 Exploratory laparotomy lysis of adhesion and remained on vent/ on pressors post op and PCCM service consulted.   Worsening renal failure despite volume resuscitation and ongoing pressors and MV requirements. Transferred to [facility] for CRRT.     Significant Hospital Events: Including procedures, antibiotic start and stop dates in addition to other pertinent events   8/14 admitted; bowel obstruction managed medically 8/15 renal function worsening 8/17  Went into shock> emergent exploratory laparotomy w/ lysis of adhesion, intubated, RIJ CVC placed in OR 8/18 worsening renal  function 8/19 arrived at [facility], starting CRRT 8/21 extubated, TNA started, CRRT discontinued 8/22 transferred to floor. 01/18/2022 pulmonary critical care called back for pleural effusions no need for thoracentesis at this time  Interim History / Subjective:  More sleepy post dialysis today. Increased PVC's Still on amiodarone.   Objective   Blood pressure 109/65, pulse 66, temperature 97.6 F (36.4 C), temperature source Oral, resp. rate 19, height '5\' 4"'$  (1.626 m), weight 94.4 kg, SpO2 98 %.        Intake/Output Summary (Last 24 hours) at 01/18/2022 1059 Last data filed at 01/18/2022 0754 Gross per 24 hour  Intake 2913.15 ml  Output 45 ml  Net 2868.15 ml   Filed Weights   01/13/22 1236 01/14/22 0500 01/15/22 0500  Weight: 89.8 kg 90.4 kg 94.4 kg        Examination: General: Somewhat stunned but awake alert follows commands HEENT: MM pink/moist no JVD is appreciated Neuro: Grossly intact but slow a fact CV: Heart sounds are distant PULM: 3l Nasal cannula equals 97% O2 saturations decreased breath sounds in the bases  GI: soft, bsx4 active  Extremities: warm/dry, 3+ lower extremity and upper extremity edema edema  Skin: no rashes or lesions    Assessment & Plan:  Incidental finding of bilateral pleural effusions on CT of the abdomen on 01/17/2022  Hemodynamically stable She is massively volume overloaded with 3-4+ edema/anasarca lower extremities and arms suspect once she is dialyzed with a plan of 1 L removal probably needs a probably 3 L removed pleural effusion will resolve.  She is in no respiratory distress and actually feels better today than she did yesterday.  sleepiness following HD Likely some degree of dialysis dysequilibrium. This was her first HD session.  Improving slowly  Sepsis s/p lap for SBO Resolved  Afib with RVR Per cardiology  AKI - likely ATN from sepsis and prolonged pre-renal state. ARB prior to admit UOP improved. CRRT discontinued  8/21. Lab Results  Component Value Date   CREATININE 2.57 (H) 01/18/2022   CREATININE 2.33 (H) 01/17/2022   CREATININE 2.08 (H) 01/17/2022   Renal  Moderate protein energy malnutrition Currently on TPN  Chronic anemia Recent Labs    01/17/22 0536 01/18/22 0707  HGB 8.3* 7.9*     Transfuse per protocol  Best Practice (right click and "Reselect all SmartList Selections" daily)  Per primary  Richardson Landry Minor ACNP Acute Care Nurse Practitioner [place] Please consult Amion 01/18/2022, 11:00 AM    PCCM:  76 yo FM, ESRD on IHD BL pleural effusions  BP 107/67   Pulse 76   Temp 97.6 F (36.4 C) (Oral)   Resp 18   Ht '5\' 4"'$  (1.626 m)   Wt 94.4 kg   SpO2 100%   BMI 35.72 kg/m   Gen: Chronically ill-appearing obese female Heart: Regular rhythm S1-S2, distant heart tones Lungs: Diminished breath sounds bilateral bases Abdomen: Obese soft nontender nondistended Extremities: Significant dependent 2+ edema  Labs reviewed  CT imagine BL effusion s  A: Bilateral pleural effusion End-stage renal disease on IHD On continuous heparin  P: Hold heparin Plan for thoracentesis, right side largest effusion Potentially will need other side drain in future. Continue volume removal with HD Discussed with Dr. Tyrell Antonio We appreciate consultation  Garner Nash, DO [facility] 01/18/2022 1:26 PM

## 2022-01-18 NOTE — Progress Notes (Signed)
Received patient in bed to unit.  Alert and oriented.  Informed consent signed and in chart.   Treatment initiated: 1101 Treatment completed: 1446  Patient tolerated well.  Transported back to the room  Alert, without acute distress.  Hand-off given to patient's nurse.   Access used: Temp cath Access issues:none   Total UF removed: 1L Medication(s) given: k+ Post HD VS: 126/56,74,18,98.6 Post HD weight: 105.4kg   [facility]

## 2022-01-18 NOTE — Progress Notes (Signed)
Patient ID: [name], female   DOB: [birth year], 76 y.o.   MRN: [id number] [facility] Progress Note   Subjective:    Pallative notes reviewed For HD today, no clear UOP K 3.1, P now 3.2 On TPN K and P included Discussed status esp renal issues with son [name]  by phone    Objective:   BP 98/66   Pulse (!) 45   Temp 97.6 F (36.4 C) (Oral)   Resp 13   Ht '5\' 4"'$  (1.626 m)   Wt 94.4 kg   SpO2 100%   BMI 35.72 kg/m    Physical Exam: Gen: Frail and debilitated, In bed CVS: Pulse regular rhythm, normal rate, S1 and S2 normal Resp: diminished effort Abd: Soft, honeycomb dressing in place, no drainage noted Ext: Trace bilateral lower extremity edema with lichenification of skin noted, poor skin turgo L IJ Temp Cath C/D/I   Assessment/ Plan:   1. Dialysis dependent anuric Acute kidney Injury -  baseline renal function essentially normal with a creatinine of 1.09 anuric ischemic ATN from high-grade small bowel obstruction, likely third spacing and sustained renal hypoperfusion.  She was on ARB prior to admission.   Off pressors CRRT stop 01/14/22 HD first Tx 8/23, did marginally Next tx 8/25: 4K 3h, 1L UF, no heparin Not bouncin back, any recovery will be slo Palliative following  2.  Septic shock - high-grade small bowel obstruction s/p exploratory laparotomy with adhesiolysis.  Off pressors  3.  High-grade small bowel obstruction: Status post exploratory laparotomy by Dr. Constance Haw with lysis of additions.  CCS following. AROBF  4.  Anion gap metabolic acidosis: Resolved.    5. Hypophosphatemia: nutritional, has some P into TPN  6. Hypokalemia Nutritional GI losses, as above   Recent Labs  Lab 01/15/22 0431 01/16/22 0627 01/17/22 0115 01/17/22 0536 01/17/22 1100 01/18/22 0707  HGB 8.4* 8.8*  --  8.3*  --  7.9*  ALBUMIN 1.8* 1.8*  --   --   --   --   CALCIUM 7.4* 7.7*   < >  --  7.3* 7.1*  PHOS 2.0* 1.4*   < > 2.5  --  3.2  CREATININE 2.09* 2.75*    < >  --  2.33* 2.57*  K 3.3* 3.3*   < >  --  3.1* 3.1*   < > = values in this interval not displayed.     Medications:     Chlorhexidine Gluconate Cloth  6 each Topical Daily   Chlorhexidine Gluconate Cloth  6 each Topical Q0600   escitalopram  10 mg Oral Daily   feeding supplement  1 Container Oral TID BM   metoprolol tartrate  12.5 mg Oral BID   sodium chloride flush  10-40 mL Intracatheter Q12H   sodium chloride flush  3 mL Intravenous Q12H   Rexene Agent, MD  [facility]

## 2022-01-18 NOTE — Progress Notes (Addendum)
PROGRESS NOTE    [name]  JEH:[id number] DOB: [birth year] DOA: 01/07/2022 PCP: Chevis Pretty, FNP   Brief Narrative: 76 year old with past medical history hyperlipidemia, hypertension, hypothyroidism, PE in the past, osteoarthritis, depression who presents from home with nausea vomiting and diarrhea since 8/11.  CT scan was obtained and was unremarkable.  She received IV fluids and antiemetic and was discharged home.  Her vomiting and diarrhea persisted, she presented to [facility], CT scan repeated found to have partial small bowel obstruction and significant changes compared to CT done on 8/11. Admitted 80/14 for bowel obstruction and was managed medically.  She developed acute renal failure, developed shock and underwent emergent exploratory laparotomy with lysis of adhesion on 8/17.  Intubated for Sx.  She was started on CRRT.  She was transferred to [facility] 8/19.  She was subsequently extubated 8/21st, started on TNA, CRRT discontinued.  She was started on hemodialysis first section 8/23. During hospitalization she also developed A-fib with RVR, started on amiodarone, cardiology consulted.  In hemodialysis 8/23  she was noted to be more lethargic, she was noted to have aberrantly conducted A-fib with PVCs, versus slow rate  V. Tach.  IV amiodarone was resumed.  Patient develops worsening abdominal pain, CT scan negative for abscess, no significant bowel obstruction. Surgery will resume clear liquid diet. She was notice to have BL pleural effusion moderate. CCM consulted for evaluation for Thoracentesis.    Assessment & Plan:   Principal Problem:   Septic shock ([place]) Active Problems:   Respiratory failure requiring intubation ([place])   Small bowel obstruction (HCC)   COPD (chronic obstructive pulmonary disease) (HCC)   Hyperlipidemia   Hypothyroidism (acquired)   Essential hypertension   History of pulmonary embolus (PE)   AKI (acute kidney injury) ([place])   Acute  respiratory failure with hypoxia and hypercapnia (HCC)   Pressure injury of skin   Malnutrition of moderate degree   1-SBO status post laparotomy 8/17 NG tube has been removed.  Patient started on TPN 8/21 Treated course of aztreonam with metronidazole Surgery following Rectal tube place per surgery./ Having diarrhea.  Worsening Leukocytosis, KUB with concern for SBO.  CT abdomen and pelvis 8/24: , due to abdominal pain and leukocytosis. Negative for abscess, decrease bowel distension small bowel loop.  Vicodin PRN for pain.   2-Acute Metabolic Encephalopathy: She was more lethargic 8/23. ABG with hypoxia--- placed on oxygen CCM to evaluate, recommended support care.  Chest x ray: with pulmonary edema, PNA not excluded. IV antibiotics resume.  CT head. Negative.  Multifactorial secondary to delirium, acute illness.  Improved. She is more alert.   3-A-fib with RVR, PVC Versus slow rate  V. tach IV amiodarone  Replete Mg IV. Potassium corrected with HD>  On low dose metoprolol.  Appreciate cardiology assistance, ectopy during last HD related to electrolytes abnormalities.  If stable plan to transition to oral amiodarone.   4-Acute hypoxic respiratory failure; ? PNA Vs Pulmonary edema BL Pleural effusion; ABG Po2 58 Placed on oxygen.. Chest x ray : pulmonary edema vs infection.  Started IV Aztreonam and flagyl.  Volume manage with HD>  CT abdomen pelvis showed: Moderate to large bilateral pleural effusions. CCM consulted for evaluation of Thoracentesis.   AKI likely from ATN from sepsis Treated with CRRT and discontinue 80/21 Nephrology  following and initiating intermittent hemodialysis Tolerated better HB today, no significant ectopy.   5-Moderate protein malnutrition: Continue with TPN  Chronic anemia: Monitor hemoglobin  Hypokalemia, hypophosphatemia,: Replete.  Leukocytosis: Chest x ray PNA vs edema.  CT abdomen pelvis. Negative for abscess.  Check GI  pathogen.  Plan for Thoracentesis.   See wound care documentation below  Pressure Injury 01/10/22 Coccyx Medial;Mid Stage 2 -  Partial thickness loss of dermis presenting as a shallow open injury with a red, pink wound bed without slough. pink (Active)  01/10/22 1200  Location: Coccyx  Location Orientation: Medial;Mid  Staging: Stage 2 -  Partial thickness loss of dermis presenting as a shallow open injury with a red, pink wound bed without slough.  Wound Description (Comments): pink  Present on Admission:   Dressing Type None 01/17/22 2140     Pressure Injury 01/12/22 Face Left Stage 2 -  Partial thickness loss of dermis presenting as a shallow open injury with a red, pink wound bed without slough. red ulceration beneath ETT holder on L cheek (Active)  01/12/22 1815  Location: Face  Location Orientation: Left  Staging: Stage 2 -  Partial thickness loss of dermis presenting as a shallow open injury with a red, pink wound bed without slough.  Wound Description (Comments): red ulceration beneath ETT holder on L cheek  Present on Admission: Yes  Dressing Type None 01/17/22 2140     Pressure Injury 01/16/22 Lumbar Left (Active)  01/16/22 1230  Location: Lumbar  Location Orientation: Left  Staging:   Wound Description (Comments):   Present on Admission: Yes  Dressing Type Foam - Lift dressing to assess site every shift 01/17/22 0800     Pressure Injury 01/16/22 Buttocks Left (Active)  01/16/22 1230  Location: Buttocks  Location Orientation: Left  Staging:   Wound Description (Comments):   Present on Admission: Yes  Dressing Type None 01/17/22 2140     Nutrition Problem: Moderate Malnutrition Etiology: acute illness (SBO)    Signs/Symptoms: energy intake < 75% for > 7 days, mild muscle depletion, moderate fat depletion    Interventions: TPN  Estimated body mass index is 35.72 kg/m as calculated from the following:   Height as of this encounter: '5\' 4"'$  (1.626 m).    Weight as of this encounter: 94.4 kg.   DVT prophylaxis: Heparin drip Code Status: Full code Family Communication: Son updated over the phone 8/25  Disposition Plan:  Status is: Inpatient Remains inpatient appropriate because: Remain for management of SBO, status postsurgery, A-fib, renal failure    Consultants:  Nephrology CCM Surgery  Procedures:    Antimicrobials:    Subjective: She is awake, she wonder what we can do for her feet movement. We talk about correcting electrolytes. She denies worsening abdominal pain.    Objective: Vitals:   01/18/22 0048 01/18/22 0601 01/18/22 0801 01/18/22 1016  BP: 121/70 105/61 109/65   Pulse: (!) 59 68 66   Resp: '18 18 17 19  '$ Temp: 97.7 F (36.5 C) 97.6 F (36.4 C) 97.6 F (36.4 C)   TempSrc: Oral Oral Oral   SpO2: 98% 100% 98%   Weight:      Height:        Intake/Output Summary (Last 24 hours) at 01/18/2022 1022 Last data filed at 01/18/2022 0754 Gross per 24 hour  Intake 2913.15 ml  Output 45 ml  Net 2868.15 ml    Filed Weights   01/13/22 1236 01/14/22 0500 01/15/22 0500  Weight: 89.8 kg 90.4 kg 94.4 kg    Examination:  General exam: NAD Respiratory system: BL crackles.  Cardiovascular system: S 1, S 2 RRR Gastrointestinal system: BS present, soft, nt, midline  incision cover.  Central nervous system: She is alert, follows command Extremities: plus 2 edema.  \  Data Reviewed: I have personally reviewed following labs and imaging studies  CBC: Recent Labs  Lab 01/12/22 0344 01/13/22 0448 01/14/22 0400 01/15/22 0431 01/16/22 0627 01/17/22 0536 01/18/22 0707  WBC 15.9* 14.7* 13.0* 18.8* 19.5* 20.2* 19.1*  NEUTROABS 11.2* 12.1*  --   --   --   --   --   HGB 8.8* 10.2* 9.4* 8.4* 8.8* 8.3* 7.9*  HCT 27.7* 31.9* 28.9* 26.8* 28.2* 25.8* 24.7*  MCV 87.7 87.6 85.8 89.0 89.8 88.7 88.8  PLT 137* 144* 145* 185 202 218 053    Basic Metabolic Panel: Recent Labs  Lab 01/15/22 0431 01/16/22 0627  01/17/22 0115 01/17/22 0536 01/17/22 1100 01/18/22 0707  NA 138 134* 132*  --  131* 126*  K 3.3* 3.3* 2.6*  --  3.1* 3.1*  CL 107 106 102  --  102 102  CO2 '24 22 22  '$ --  22 21*  GLUCOSE 98 113* 112*  --  120* 112*  BUN 28* 43* 34*  --  41* 54*  CREATININE 2.09* 2.75* 2.08*  --  2.33* 2.57*  CALCIUM 7.4* 7.7* 7.1*  --  7.3* 7.1*  MG 2.1 2.1 1.5*  --  1.9 1.8  PHOS 2.0* 1.4* 2.6 2.5  --  3.2    GFR: Estimated Creatinine Clearance: 21.1 mL/min (A) (by C-G formula based on SCr of 2.57 mg/dL (H)). Liver Function Tests: Recent Labs  Lab 01/12/22 0344 01/12/22 1423 01/12/22 2123 01/13/22 1521 01/14/22 0400 01/14/22 1615 01/15/22 0431 01/16/22 0627  AST 32 27  --   --  20  --  34 25  ALT 14 15  --   --  16  --  20 17  ALKPHOS 57 61  --   --  84  --  155* 128*  BILITOT 1.2 0.9  --   --  0.7  --  0.7 0.6  PROT 4.2* 4.4*  --   --  4.5*  --  4.1* 4.1*  ALBUMIN 2.0* 2.0*   < > 1.9* 1.9* 1.8* 1.8* 1.8*   < > = values in this interval not displayed.    No results for input(s): "LIPASE", "AMYLASE" in the last 168 hours. No results for input(s): "AMMONIA" in the last 168 hours. Coagulation Profile: No results for input(s): "INR", "PROTIME" in the last 168 hours. Cardiac Enzymes: No results for input(s): "CKTOTAL", "CKMB", "CKMBINDEX", "TROPONINI" in the last 168 hours. BNP (last 3 results) No results for input(s): "PROBNP" in the last 8760 hours. HbA1C: No results for input(s): "HGBA1C" in the last 72 hours. CBG: Recent Labs  Lab 01/17/22 0810 01/17/22 1128 01/17/22 1804 01/17/22 2358 01/18/22 0646  GLUCAP 127* 120* 117* 119* 107*    Lipid Profile: Recent Labs    01/17/22 0536  TRIG 39    Thyroid Function Tests: No results for input(s): "TSH", "T4TOTAL", "FREET4", "T3FREE", "THYROIDAB" in the last 72 hours. Anemia Panel: No results for input(s): "VITAMINB12", "FOLATE", "FERRITIN", "TIBC", "IRON", "RETICCTPCT" in the last 72 hours. Sepsis Labs: No results for  input(s): "PROCALCITON", "LATICACIDVEN" in the last 168 hours.   Recent Results (from the past 240 hour(s))  Surgical pcr screen     Status: None   Collection Time: 01/10/22  6:20 AM   Specimen: Nasal Mucosa; Nasal Swab  Result Value Ref Range Status   MRSA, PCR NEGATIVE NEGATIVE Final   Staphylococcus aureus NEGATIVE  NEGATIVE Final    Comment: (NOTE) The Xpert SA Assay (FDA approved for NASAL specimens in patients 2 years of age and older), is one component of a comprehensive surveillance program. It is not intended to diagnose infection nor to guide or monitor treatment. Performed at [facility], [address], [place], [place] [address]   Culture, blood (Routine X 2) w Reflex to ID Panel     Status: None   Collection Time: 01/10/22 12:55 PM   Specimen: BLOOD  Result Value Ref Range Status   Specimen Description BLOOD LEFT ANTECUBITAL  Final   Special Requests   Final    BOTTLES DRAWN AEROBIC AND ANAEROBIC Blood Culture results may not be optimal due to an excessive volume of blood received in culture bottles   Culture   Final    NO GROWTH 5 DAYS Performed at [facility], [address] [place], [place] [address]    Report Status 01/15/2022 FINAL  Final  Culture, blood (Routine X 2) w Reflex to ID Panel     Status: None   Collection Time: 01/10/22 12:55 PM   Specimen: BLOOD  Result Value Ref Range Status   Specimen Description BLOOD BLOOD RIGHT HAND  Final   Special Requests   Final    BOTTLES DRAWN AEROBIC ONLY Blood Culture adequate volume   Culture   Final    NO GROWTH 5 DAYS Performed at [facility], [address], [place], [place] [address]    Report Status 01/15/2022 FINAL  Final  MRSA Next Gen by PCR, Nasal     Status: None   Collection Time: 01/10/22  1:11 PM   Specimen: Nasal Mucosa; Nasal Swab  Result Value Ref Range Status   MRSA by PCR Next Gen NOT DETECTED NOT DETECTED Final    Comment: (NOTE) The GeneXpert MRSA Assay (FDA approved for NASAL specimens  only), is one component of a comprehensive MRSA colonization surveillance program. It is not intended to diagnose MRSA infection nor to guide or monitor treatment for MRSA infections. Test performance is not FDA approved in patients less than 46 years old. Performed at [facility], [address], [place], [place] [address]   Culture, Respiratory w Gram Stain     Status: None   Collection Time: 01/12/22  6:14 PM   Specimen: Tracheal Aspirate; Respiratory  Result Value Ref Range Status   Specimen Description TRACHEAL ASPIRATE  Final   Special Requests NONE  Final   Gram Stain NO WBC SEEN NO ORGANISMS SEEN   Final   Culture   Final    RARE Normal respiratory flora-no Staph aureus or Pseudomonas seen Performed at [facility], [address], [place], [place] [address]    Report Status 01/14/2022 FINAL  Final  MRSA Next Gen by PCR, Nasal     Status: None   Collection Time: 01/12/22  6:50 PM   Specimen: Nasal Mucosa; Nasal Swab  Result Value Ref Range Status   MRSA by PCR Next Gen NOT DETECTED NOT DETECTED Final    Comment: (NOTE) The GeneXpert MRSA Assay (FDA approved for NASAL specimens only), is one component of a comprehensive MRSA colonization surveillance program. It is not intended to diagnose MRSA infection nor to guide or monitor treatment for MRSA infections. Test performance is not FDA approved in patients less than 50 years old. Performed at [facility], [place] [address] [place], [place] [address]          Radiology Studies:  CT ABDOMEN PELVIS WO CONTRAST  Result Date: 01/17/2022 CLINICAL DATA:  Abdominal pain, post-op s/p ex lap with LOA, leukocytosis. Postop for small-bowel obstruction EXAM: CT ABDOMEN AND PELVIS WITHOUT CONTRAST TECHNIQUE: Multidetector CT imaging of the abdomen and pelvis was performed following the standard protocol without IV contrast. RADIATION DOSE REDUCTION: This exam was performed according to the departmental dose-optimization  program which includes automated exposure control, adjustment of the mA and/or kV according to patient size and/or use of iterative reconstruction technique. COMPARISON:  01/09/2022 FINDINGS: Lower chest: Moderate to large bilateral pleural effusions, increasing since prior study. Compressive atelectasis in the lower lobes. Coronary artery and aortic calcifications. Small to moderate-sized hiatal hernia. Hepatobiliary: No focal liver abnormality is seen. Status post cholecystectomy. No biliary dilatation. Pancreas: No focal abnormality or ductal dilatation. Spleen: No focal abnormality.  Normal size. Adrenals/Urinary Tract: No adrenal abnormality. No focal renal abnormality. No stones or hydronephrosis. Urinary bladder is unremarkable. Stomach/Bowel: Oral contrast material seen within the distal small bowel and colon. Decreasing gaseous distension of small bowel loops. Continued mild gaseous distention of pelvic small bowel loops measuring up to 3.3 cm in diameter. Stomach and large bowel unremarkable. Vascular/Lymphatic: Scattered aortic atherosclerosis. No evidence of aneurysm or adenopathy. Reproductive: Prior hysterectomy.  No adnexal masses. Other: No free fluid or free air. Edema throughout the subcutaneous soft tissues of the abdominal and pelvic wall compatible with anasarca. Musculoskeletal: No acute bony abnormality. Postoperative changes in the lower lumbar spine. IMPRESSION: Decreasing gaseous distension of small bowel loops with improving bowel gas pattern. Continued mild distention of a few lower abdominal/pelvic small bowel loops measuring up to 3.3 cm. Oral contrast material has passed into the distal small bowel and colon. Moderate to large bilateral pleural effusions, increasing since prior study. Compressive atelectasis in the lower lobes. Small to moderate hiatal hernia. Aortic atherosclerosis. Anasarca like edema throughout the abdominal wall. Electronically Signed   By: Rolm Baptise M.D.   On:  01/17/2022 21:29   DG Abd 2 Views  Result Date: 01/17/2022 CLINICAL DATA:  Abdominal pain EXAM: ABDOMEN - 2 VIEW COMPARISON:  CT 01/09/2022 FINDINGS: Laparotomy skin staples are noted. Multiple gas-filled dilated loops of small bowel are again seen within the mid abdomen suggesting a postoperative ileus or persistent small bowel obstruction. No gas is seen within the large bowel. No gross free intraperitoneal gas. There is central clumping of bowel loops suggesting underlying ascites. Small bilateral pleural effusions are present. Central venous catheter tip noted within the low right atrium. Cholecystectomy clips in the right upper quadrant. IMPRESSION: 1. Persistent small bowel obstruction versus postoperative ileus. 2. Suspected ascites. 3. Small bilateral pleural effusions. Electronically Signed   By: Fidela Salisbury M.D.   On: 01/17/2022 00:13   CT HEAD WO CONTRAST (5MM)  Result Date: 01/16/2022 CLINICAL DATA:  Provided history: Altered mental status, nontraumatic. EXAM: CT HEAD WITHOUT CONTRAST TECHNIQUE: Contiguous axial images were obtained from the base of the skull through the vertex without intravenous contrast. RADIATION DOSE REDUCTION: This exam was performed according to the departmental dose-optimization program which includes automated exposure control, adjustment of the mA and/or kV according to patient size and/or use of iterative reconstruction technique. COMPARISON:  No pertinent prior exams available for comparison. FINDINGS: Brain: Mild generalized parenchymal atrophy. There is no acute intracranial hemorrhage. No demarcated cortical infarct. No extra-axial fluid collection. No evidence of an intracranial mass. No midline shift. Vascular: No hyperdense vessel. Atherosclerotic calcifications. Skull: No fracture or aggressive osseous lesion. Sinuses/Orbits: No mass or  acute finding within the imaged orbits. 18 mm polyp versus mucous retention cyst, and minimal background mucosal  thickening, within the left maxillary sinus. 16 mm mucous retention cyst versus polyp within the left frontal sinus. Other: Small-volume fluid within the bilateral mastoid air cells. IMPRESSION: 1. No evidence of acute intracranial abnormality. 2. Mild generalized parenchymal atrophy. 3. Paranasal sinus disease, as described. 4. Small bilateral mastoid effusions. Electronically Signed   By: Kellie Simmering D.O.   On: 01/16/2022 16:30   DG CHEST PORT 1 VIEW  Result Date: 01/16/2022 CLINICAL DATA:  Leukocytosis. EXAM: PORTABLE CHEST 1 VIEW COMPARISON:  Chest 01/12/2022 FINDINGS: Endotracheal tube removed.  NG removed. Left jugular dual lumen catheter tip in the SVC unchanged. Right sided central venous catheter extends into the right atrium with the tip not visualized but appears unchanged from the prior study Cardiac enlargement with vascular congestion and bilateral airspace disease most compatible with edema. Bilateral pleural effusions and bibasilar atelectasis/edema unchanged. No pneumothorax IMPRESSION: Endotracheal tube removed. Diffuse bilateral airspace disease and bilateral effusions most likely pulmonary edema unchanged. Underlying pneumonia not excluded. Electronically Signed   By: Franchot Gallo M.D.   On: 01/16/2022 15:35        Scheduled Meds:  Chlorhexidine Gluconate Cloth  6 each Topical Daily   Chlorhexidine Gluconate Cloth  6 each Topical Q0600   escitalopram  10 mg Oral Daily   feeding supplement  1 Container Oral TID BM   metoprolol tartrate  12.5 mg Oral BID   sodium chloride flush  10-40 mL Intracatheter Q12H   sodium chloride flush  3 mL Intravenous Q12H   Continuous Infusions:  sodium chloride Stopped (01/16/22 1822)   sodium chloride Stopped (01/15/22 1255)   sodium chloride     acetaminophen 1,000 mg (01/18/22 0650)   amiodarone 30 mg/hr (01/18/22 0146)   aztreonam 1 g (01/18/22 0754)   heparin 1,850 Units/hr (01/18/22 0657)   magnesium sulfate bolus IVPB      magnesium sulfate bolus IVPB     metronidazole 500 mg (01/18/22 0646)   potassium chloride     TPN ADULT (ION) 65 mL/hr at 01/17/22 1734   TPN ADULT (ION)       LOS: 11 days    Time spent: 35 minutes    Rivers Gassmann A Mignon Bechler, MD [facility]   If 7PM-7AM, please contact night-coverage [email or web address]  01/18/2022, 10:22 AM

## 2022-01-18 NOTE — Progress Notes (Signed)
Progress Note  Patient Name: [name] Date of Encounter: 01/18/2022  Primary Cardiologist: Carlyle Dolly, MD   Subjective   Planned for HD today Patient notes that she is worried about being stuck in the bed. Had restless legs. No palpitations or SOB  Inpatient Medications    Scheduled Meds:  Chlorhexidine Gluconate Cloth  6 each Topical Daily   Chlorhexidine Gluconate Cloth  6 each Topical Q0600   escitalopram  10 mg Oral Daily   feeding supplement  1 Container Oral TID BM   insulin aspart  0-9 Units Subcutaneous Q6H   metoprolol tartrate  12.5 mg Oral BID   sodium chloride flush  10-40 mL Intracatheter Q12H   sodium chloride flush  3 mL Intravenous Q12H   Continuous Infusions:  sodium chloride Stopped (01/16/22 1822)   sodium chloride Stopped (01/15/22 1255)   sodium chloride     acetaminophen 1,000 mg (01/18/22 0650)   amiodarone 30 mg/hr (01/18/22 0146)   aztreonam 1 g (01/17/22 2302)   heparin 1,850 Units/hr (01/18/22 0657)   metronidazole 500 mg (01/18/22 0646)   TPN ADULT (ION) 65 mL/hr at 01/17/22 1734   PRN Meds: sodium chloride, Place/Maintain arterial line **AND** sodium chloride, acetaminophen, albuterol, alteplase, lip balm, [DISCONTINUED] ondansetron **OR** ondansetron (ZOFRAN) IV, mouth rinse, oxyCODONE, sodium chloride, sodium chloride flush, sodium chloride flush   Vital Signs    Vitals:   01/17/22 2139 01/18/22 0004 01/18/22 0048 01/18/22 0601  BP:  123/63 121/70 105/61  Pulse: 64 71 (!) 59 68  Resp:  '14 18 18  '$ Temp:  97.8 F (36.6 C) 97.7 F (36.5 C) 97.6 F (36.4 C)  TempSrc:  Oral Oral Oral  SpO2:  98% 98% 100%  Weight:      Height:        Intake/Output Summary (Last 24 hours) at 01/18/2022 0732 Last data filed at 01/17/2022 2300 Gross per 24 hour  Intake 964.25 ml  Output 45 ml  Net 919.25 ml   Filed Weights   01/13/22 1236 01/14/22 0500 01/15/22 0500  Weight: 89.8 kg 90.4 kg 94.4 kg    Telemetry    Afib with rare  PVCs - Personally Reviewed  Physical Exam   Gen: No distress BMI 35 Neck: No JVD Cardiac: No Rubs or Gallops, no Murmur, RRR  Respiratory: Clear to auscultation bilaterally, normal effort, normal  respiratory rate GI: Soft, non distended MS: bilateral edema   Labs    Chemistry Recent Labs  Lab 01/14/22 0400 01/14/22 1615 01/15/22 0431 01/16/22 0627 01/17/22 0115 01/17/22 1100  NA 138 136 138 134* 132* 131*  K 3.7 3.6 3.3* 3.3* 2.6* 3.1*  CL 109 107 107 106 102 102  CO2 20* '24 24 22 22 22  '$ GLUCOSE 158* 125* 98 113* 112* 120*  BUN 31* 23 28* 43* 34* 41*  CREATININE 2.08* 1.64* 2.09* 2.75* 2.08* 2.33*  CALCIUM 7.4* 7.3* 7.4* 7.7* 7.1* 7.3*  PROT 4.5*  --  4.1* 4.1*  --   --   ALBUMIN 1.9* 1.8* 1.8* 1.8*  --   --   AST 20  --  34 25  --   --   ALT 16  --  20 17  --   --   ALKPHOS 84  --  155* 128*  --   --   BILITOT 0.7  --  0.7 0.6  --   --   GFRNONAA 24* 32* 24* 17* 24* 21*  ANIONGAP '9 5 7 6 '$ 8  7     Hematology Recent Labs  Lab 01/15/22 0431 01/16/22 0627 01/17/22 0536  WBC 18.8* 19.5* 20.2*  RBC 3.01* 3.14* 2.91*  HGB 8.4* 8.8* 8.3*  HCT 26.8* 28.2* 25.8*  MCV 89.0 89.8 88.7  MCH 27.9 28.0 28.5  MCHC 31.3 31.2 32.2  RDW 18.6* 18.9* 18.9*  PLT 185 202 218    Cardiac EnzymesNo results for input(s): "TROPONINI" in the last 168 hours. No results for input(s): "TROPIPOC" in the last 168 hours.   BNPNo results for input(s): "BNP", "PROBNP" in the last 168 hours.   DDimer No results for input(s): "DDIMER" in the last 168 hours.   Radiology    CT ABDOMEN PELVIS WO CONTRAST  Result Date: 01/17/2022 CLINICAL DATA:  Abdominal pain, post-op s/p ex lap with LOA, leukocytosis. Postop for small-bowel obstruction EXAM: CT ABDOMEN AND PELVIS WITHOUT CONTRAST TECHNIQUE: Multidetector CT imaging of the abdomen and pelvis was performed following the standard protocol without IV contrast. RADIATION DOSE REDUCTION: This exam was performed according to the departmental  dose-optimization program which includes automated exposure control, adjustment of the mA and/or kV according to patient size and/or use of iterative reconstruction technique. COMPARISON:  01/09/2022 FINDINGS: Lower chest: Moderate to large bilateral pleural effusions, increasing since prior study. Compressive atelectasis in the lower lobes. Coronary artery and aortic calcifications. Small to moderate-sized hiatal hernia. Hepatobiliary: No focal liver abnormality is seen. Status post cholecystectomy. No biliary dilatation. Pancreas: No focal abnormality or ductal dilatation. Spleen: No focal abnormality.  Normal size. Adrenals/Urinary Tract: No adrenal abnormality. No focal renal abnormality. No stones or hydronephrosis. Urinary bladder is unremarkable. Stomach/Bowel: Oral contrast material seen within the distal small bowel and colon. Decreasing gaseous distension of small bowel loops. Continued mild gaseous distention of pelvic small bowel loops measuring up to 3.3 cm in diameter. Stomach and large bowel unremarkable. Vascular/Lymphatic: Scattered aortic atherosclerosis. No evidence of aneurysm or adenopathy. Reproductive: Prior hysterectomy.  No adnexal masses. Other: No free fluid or free air. Edema throughout the subcutaneous soft tissues of the abdominal and pelvic wall compatible with anasarca. Musculoskeletal: No acute bony abnormality. Postoperative changes in the lower lumbar spine. IMPRESSION: Decreasing gaseous distension of small bowel loops with improving bowel gas pattern. Continued mild distention of a few lower abdominal/pelvic small bowel loops measuring up to 3.3 cm. Oral contrast material has passed into the distal small bowel and colon. Moderate to large bilateral pleural effusions, increasing since prior study. Compressive atelectasis in the lower lobes. Small to moderate hiatal hernia. Aortic atherosclerosis. Anasarca like edema throughout the abdominal wall. Electronically Signed   By: Rolm Baptise M.D.   On: 01/17/2022 21:29   DG Abd 2 Views  Result Date: 01/17/2022 CLINICAL DATA:  Abdominal pain EXAM: ABDOMEN - 2 VIEW COMPARISON:  CT 01/09/2022 FINDINGS: Laparotomy skin staples are noted. Multiple gas-filled dilated loops of small bowel are again seen within the mid abdomen suggesting a postoperative ileus or persistent small bowel obstruction. No gas is seen within the large bowel. No gross free intraperitoneal gas. There is central clumping of bowel loops suggesting underlying ascites. Small bilateral pleural effusions are present. Central venous catheter tip noted within the low right atrium. Cholecystectomy clips in the right upper quadrant. IMPRESSION: 1. Persistent small bowel obstruction versus postoperative ileus. 2. Suspected ascites. 3. Small bilateral pleural effusions. Electronically Signed   By: Fidela Salisbury M.D.   On: 01/17/2022 00:13   CT HEAD WO CONTRAST (5MM)  Result Date: 01/16/2022 CLINICAL DATA:  Provided  history: Altered mental status, nontraumatic. EXAM: CT HEAD WITHOUT CONTRAST TECHNIQUE: Contiguous axial images were obtained from the base of the skull through the vertex without intravenous contrast. RADIATION DOSE REDUCTION: This exam was performed according to the departmental dose-optimization program which includes automated exposure control, adjustment of the mA and/or kV according to patient size and/or use of iterative reconstruction technique. COMPARISON:  No pertinent prior exams available for comparison. FINDINGS: Brain: Mild generalized parenchymal atrophy. There is no acute intracranial hemorrhage. No demarcated cortical infarct. No extra-axial fluid collection. No evidence of an intracranial mass. No midline shift. Vascular: No hyperdense vessel. Atherosclerotic calcifications. Skull: No fracture or aggressive osseous lesion. Sinuses/Orbits: No mass or acute finding within the imaged orbits. 18 mm polyp versus mucous retention cyst, and minimal background  mucosal thickening, within the left maxillary sinus. 16 mm mucous retention cyst versus polyp within the left frontal sinus. Other: Small-volume fluid within the bilateral mastoid air cells. IMPRESSION: 1. No evidence of acute intracranial abnormality. 2. Mild generalized parenchymal atrophy. 3. Paranasal sinus disease, as described. 4. Small bilateral mastoid effusions. Electronically Signed   By: Kellie Simmering D.O.   On: 01/16/2022 16:30   DG CHEST PORT 1 VIEW  Result Date: 01/16/2022 CLINICAL DATA:  Leukocytosis. EXAM: PORTABLE CHEST 1 VIEW COMPARISON:  Chest 01/12/2022 FINDINGS: Endotracheal tube removed.  NG removed. Left jugular dual lumen catheter tip in the SVC unchanged. Right sided central venous catheter extends into the right atrium with the tip not visualized but appears unchanged from the prior study Cardiac enlargement with vascular congestion and bilateral airspace disease most compatible with edema. Bilateral pleural effusions and bibasilar atelectasis/edema unchanged. No pneumothorax IMPRESSION: Endotracheal tube removed. Diffuse bilateral airspace disease and bilateral effusions most likely pulmonary edema unchanged. Underlying pneumonia not excluded. Electronically Signed   By: Franchot Gallo M.D.   On: 01/16/2022 15:35     Patient Profile     76 y.o. female with new AF s/p septic shock  Assessment & Plan    Persistent atrial fibrillation- with abberrant conduction  Frequent PVCs  Hypokalemia and Hypomagnesemia Chronic LE Edema complicated by protein calorie malnutrition - CHADS-VASc 6 (CHF, HTN, diabetes, age x2, gender) - patient's ectopy from last HD session is likely driving my electrolyte derangement - IV amiodarone given this issues with last HD: if no further issues would transition to 200 mg PO daily tomorrow - K bath and iHD per nephrology; appreciate recs. K goal of 4, mag goal of 2, will replete Mg today needs K replete, this could also be addressed by HD or TPN make  up - continue low dose BB - called son and gave updates per his request   For questions or updates, please contact [facility] Please consult [email or web address] for contact info under Cardiology/STEMI.      Rudean Haskell, MD [address], [id number], [place] [address] [phone]  7:32 AM

## 2022-01-18 NOTE — Op Note (Signed)
Thoracentesis  Procedure Note  Toria Monte  [id number]  03-07-1946  Date:01/18/22  Time:6:24 PM   Provider Performing:Drayden Lukas C Tamala Julian   Procedure: Thoracentesis with imaging guidance (49826)  Indication(s) Pleural Effusion  Consent Risks of the procedure as well as the alternatives and risks of each were explained to the patient and/or caregiver.  Consent for the procedure was obtained and is signed in the bedside chart  Anesthesia Topical only with 1% lidocaine    Time Out Verified patient identification, verified procedure, site/side was marked, verified correct patient position, special equipment/implants available, medications/allergies/relevant history reviewed, required imaging and test results available.   Sterile Technique Maximal sterile technique including full sterile barrier drape, hand hygiene, sterile gown, sterile gloves, mask, hair covering, sterile ultrasound probe cover (if used).  Procedure Description Ultrasound was used to identify appropriate pleural anatomy for placement and overlying skin marked.  Area of drainage cleaned and draped in sterile fashion. Lidocaine was used to anesthetize the skin and subcutaneous tissue.  1100 cc's of straw appearing fluid was drained from the right pleural space. Catheter then removed and bandaid applied to site.   Complications/Tolerance None; patient tolerated the procedure well. Chest X-ray is ordered to confirm no post-procedural complication.   EBL Minimal   Specimen(s) None

## 2022-01-19 DIAGNOSIS — R6521 Severe sepsis with septic shock: Secondary | ICD-10-CM | POA: Diagnosis not present

## 2022-01-19 DIAGNOSIS — Z515 Encounter for palliative care: Secondary | ICD-10-CM | POA: Diagnosis not present

## 2022-01-19 DIAGNOSIS — A419 Sepsis, unspecified organism: Secondary | ICD-10-CM | POA: Diagnosis not present

## 2022-01-19 LAB — BASIC METABOLIC PANEL
Anion gap: 9 (ref 5–15)
BUN: 47 mg/dL — ABNORMAL HIGH (ref 8–23)
CO2: 24 mmol/L (ref 22–32)
Calcium: 7.4 mg/dL — ABNORMAL LOW (ref 8.9–10.3)
Chloride: 96 mmol/L — ABNORMAL LOW (ref 98–111)
Creatinine, Ser: 2.19 mg/dL — ABNORMAL HIGH (ref 0.44–1.00)
GFR, Estimated: 23 mL/min — ABNORMAL LOW (ref >60)
Glucose, Bld: 121 mg/dL — ABNORMAL HIGH (ref 70–99)
Potassium: 3.6 mmol/L (ref 3.5–5.1)
Sodium: 129 mmol/L — ABNORMAL LOW (ref 135–145)

## 2022-01-19 LAB — CBC
HCT: 22.9 % — ABNORMAL LOW (ref 36.0–46.0)
Hemoglobin: 7.4 g/dL — ABNORMAL LOW (ref 12.0–15.0)
MCH: 28.4 pg (ref 26.0–34.0)
MCHC: 32.3 g/dL (ref 30.0–36.0)
MCV: 87.7 fL (ref 80.0–100.0)
Platelets: 306 10*3/uL (ref 150–400)
RBC: 2.61 MIL/uL — ABNORMAL LOW (ref 3.87–5.11)
RDW: 18.8 % — ABNORMAL HIGH (ref 11.5–15.5)
WBC: 17.2 10*3/uL — ABNORMAL HIGH (ref 4.0–10.5)
nRBC: 0 % (ref 0.0–0.2)

## 2022-01-19 LAB — C DIFFICILE (CDIFF) QUICK SCRN (NO PCR REFLEX)
C Diff antigen: NEGATIVE
C Diff interpretation: NOT DETECTED
C Diff toxin: NEGATIVE

## 2022-01-19 LAB — GLUCOSE, CAPILLARY
Glucose-Capillary: 120 mg/dL — ABNORMAL HIGH (ref 70–99)
Glucose-Capillary: 112 mg/dL — ABNORMAL HIGH (ref 70–99)
Glucose-Capillary: 103 mg/dL — ABNORMAL HIGH (ref 70–99)
Glucose-Capillary: 119 mg/dL — ABNORMAL HIGH (ref 70–99)

## 2022-01-19 LAB — MAGNESIUM: Magnesium: 1.8 mg/dL (ref 1.7–2.4)

## 2022-01-19 LAB — HEPARIN LEVEL (UNFRACTIONATED): Heparin Unfractionated: 0.45 IU/mL (ref 0.30–0.70)

## 2022-01-19 LAB — PHOSPHORUS: Phosphorus: 3 mg/dL (ref 2.5–4.6)

## 2022-01-19 MED ORDER — AMIODARONE HCL 200 MG PO TABS
400.0000 mg | ORAL_TABLET | Freq: Two times a day (BID) | ORAL | Status: AC
Start: 2022-01-19 — End: 2022-01-23
  Administered 2022-01-19 – 2022-01-23 (×8): 400 mg via ORAL
  Filled 2022-01-19 (×8): qty 2

## 2022-01-19 MED ORDER — AMIODARONE HCL 200 MG PO TABS
200.0000 mg | ORAL_TABLET | Freq: Every day | ORAL | Status: DC
  Administered 2022-01-27 – 2022-02-04 (×9): 200 mg via ORAL
  Filled 2022-01-19 (×9): qty 1

## 2022-01-19 MED ORDER — MAGNESIUM SULFATE 2 GM/50ML IV SOLN
2.0000 g | Freq: Once | INTRAVENOUS | Status: AC
Start: 2022-01-19 — End: 2022-01-19
  Administered 2022-01-19: 2 g via INTRAVENOUS
  Filled 2022-01-19: qty 50

## 2022-01-19 MED ORDER — AMIODARONE HCL 200 MG PO TABS
400.0000 mg | ORAL_TABLET | Freq: Every day | ORAL | Status: AC
  Administered 2022-01-23 – 2022-01-26 (×4): 400 mg via ORAL
  Filled 2022-01-19 (×4): qty 2

## 2022-01-19 MED ORDER — ROPINIROLE HCL 0.5 MG PO TABS
0.2500 mg | ORAL_TABLET | Freq: Once | ORAL | Status: AC
Start: 2022-01-19 — End: 2022-01-19
  Administered 2022-01-19: 0.25 mg via ORAL
  Filled 2022-01-19: qty 1

## 2022-01-19 MED ORDER — GUAIFENESIN-DM 100-10 MG/5ML PO SYRP
5.0000 mL | ORAL_SOLUTION | ORAL | Status: DC | PRN
  Administered 2022-01-19 – 2022-01-28 (×13): 5 mL via ORAL
  Filled 2022-01-19 (×13): qty 5

## 2022-01-19 MED ORDER — TRACE MINERALS CU-MN-SE-ZN 300-55-60-3000 MCG/ML IV SOLN
INTRAVENOUS | Status: AC
  Filled 2022-01-19: qty 738.4

## 2022-01-19 MED ORDER — POTASSIUM CHLORIDE 10 MEQ/50ML IV SOLN
10.0000 meq | INTRAVENOUS | Status: AC
  Administered 2022-01-19 (×3): 10 meq via INTRAVENOUS
  Filled 2022-01-19 (×3): qty 50

## 2022-01-19 NOTE — Progress Notes (Signed)
Patient ID: [name], female   DOB: Oct 04, 1945, 76 y.o.   MRN: [id number] [facility] Progress Note   Subjective:    Did better with HD yesterday, 1L UF Thoracentesis R lung 1.1L yesterday with PCCM Stable overnight 0.1L UOP    Objective:   BP 98/63 (BP Location: Right Wrist)   Pulse 73   Temp 98.1 F (36.7 C) (Oral)   Resp 15   Ht '5\' 4"'$  (1.626 m)   Wt 105.8 kg   SpO2 98%   BMI 40.04 kg/m    Physical Exam: Gen: Frail and debilitated, In bed resting CVS: Pulse regular rhythm, normal rate, S1 and S2 normal Resp: diminished effort Abd: Soft, honeycomb dressing in place, no drainage noted Ext: Trace bilateral lower extremity edema with lichenification of skin noted, poor skin turgo L IJ Temp Cath C/D/I   Assessment/ Plan:   1. Dialysis dependent anuric Acute kidney Injury -  baseline renal function essentially normal with a creatinine of 1.09 anuric ischemic ATN from high-grade small bowel obstruction, likely third spacing and sustained renal hypoperfusion.  She was on ARB prior to admission.   Off pressors CRRT stop 01/14/22 HD first Tx 8/23, did marginally but better on 8/25 Next tx 8/28 tentatively: 4K 3h, 1L UF, no heparin Palliative following  2.  Septic shock - high-grade small bowel obstruction s/p exploratory laparotomy with adhesiolysis.  Off pressors  3.  High-grade small bowel obstruction: Status post exploratory laparotomy by Dr. Constance Haw with lysis of additions.  CCS following. On TPN   4.  Anion gap metabolic acidosis: Resolved.    5. Hypophosphatemia: nutritional, has some P into TPN; better  6. Hypokalemia Nutritional GI losses, as above   Recent Labs  Lab 01/15/22 0431 01/16/22 0627 01/17/22 0115 01/18/22 0707 01/18/22 1145 01/19/22 0515 01/19/22 0730  HGB 8.4* 8.8*   < > 7.9*  --   --  7.4*  ALBUMIN 1.8* 1.8*  --   --   --   --   --   CALCIUM 7.4* 7.7*   < > 7.1* 7.1* 7.4*  --   PHOS 2.0* 1.4*   < > 3.2  --  3.0  --    CREATININE 2.09* 2.75*   < > 2.57* 1.92* 2.19*  --   K 3.3* 3.3*   < > 3.1* 3.3* 3.6  --    < > = values in this interval not displayed.     Medications:     Chlorhexidine Gluconate Cloth  6 each Topical Daily   Chlorhexidine Gluconate Cloth  6 each Topical Q0600   escitalopram  10 mg Oral Daily   feeding supplement  1 Container Oral TID BM   metoprolol tartrate  12.5 mg Oral BID   sodium chloride flush  10-40 mL Intracatheter Q12H   sodium chloride flush  3 mL Intravenous Q12H   Rexene Agent, MD  [facility]

## 2022-01-19 NOTE — Progress Notes (Signed)
1 Day Post-Op   Subjective/Chief Complaint: Patient nauseated.  Last bowel movement was 3 days ago.   Objective: Vital signs in last 24 hours: Temp:  [98 F (36.7 C)-98.4 F (36.9 C)] 98.1 F (36.7 C) (08/26 0428) Pulse Rate:  [45-78] 73 (08/26 0428) Resp:  [13-22] 15 (08/26 0428) BP: (96-126)/(52-84) 98/63 (08/26 0428) SpO2:  [96 %-100 %] 98 % (08/26 0428) Weight:  [105.4 kg-105.8 kg] 105.8 kg (08/26 0500) Last BM Date : 01/16/22  Intake/Output from previous day: 08/25 0701 - 08/26 0700 In: 4603.4 [P.O.:100; I.V.:3053; IV Piggyback:1450.4] Out: 561 [Urine:110; Stool:450] Intake/Output this shift: No intake/output data recorded.     Lungs: normal effort on [place] Abd: soft, ND, honeycomb dressing c/d/I; overall minimally tender on exam  Extremities: BUE and BLE edema  POD#8 - status post ex lap with lysis of adhesions - Dr. Constance Haw ([facility]) - Afebrile WBC 19 still.  CT scan negative for intra-abdominal infection.  -adv to CLD -may add flexi-seal if needed for diarrhea.  May also add gerhardt's butt cream if cleared by pharmacy due to an allergy.  -if lungs not felt to be etiology of possible WBC, could check c diff, but patient has no significant abdominal pain and no fever. -continue full support TPN   FEN: NPO, TPN, replacement of electrolytes per renal and primary ID: aztreonam 8/18 >>8/22, 8/23 restarted and flagyl, no abx needed from surgery standpoint VTE: hep gtt Foley: purewick in place     Afib - IV hep, rate control meds per cards AKI - CRRT stopped 8/21, now with IHD ABL anemia - stable Volume overload/pleural effusions - per medicine     Data reviewed - vitals x 24 hrs, nursing note x 24hrs, CCM note, renal note, hospitalist note, and cardiology note, labs, imaging, I/Os Lab Results:  Recent Labs    01/18/22 0707 01/19/22 0730  WBC 19.1* 17.2*  HGB 7.9* 7.4*  HCT 24.7* 22.9*  PLT 259 306   BMET Recent Labs    01/18/22 1145 01/19/22 0515  NA  130* 129*  K 3.3* 3.6  CL 100 96*  CO2 25 24  GLUCOSE 145* 121*  BUN 39* 47*  CREATININE 1.92* 2.19*  CALCIUM 7.1* 7.4*   PT/INR No results for input(s): "LABPROT", "INR" in the last 72 hours. ABG Recent Labs    01/16/22 1406  PHART 7.49*  HCO3 27.4    Studies/Results: DG CHEST PORT 1 VIEW  Result Date: 01/18/2022 CLINICAL DATA:  [id number]; status post thoracentesis EXAM: PORTABLE CHEST 1 VIEW COMPARISON:  Chest x-ray dated January 16, 2022 FINDINGS: Again seen is left transjugular central venous catheter with its tip at the upper-mid SVC region and the right IJ central venous catheter with its tip at the proximal right atrium, stable. Cardiomegaly. There has been interval right thoracentesis. No pneumothorax. Left pleural effusion. Pulmonary edema. Likely superimposed left basilar atelectasis. Moderate thoracic spondylosis. IMPRESSION: There has been interval right thoracentesis. No pneumothorax. Cardiomegaly, pulmonary edema and left pleural effusion. Electronically Signed   By: Frazier Richards M.D.   On: 01/18/2022 17:55   CT ABDOMEN PELVIS WO CONTRAST  Result Date: 01/17/2022 CLINICAL DATA:  Abdominal pain, post-op s/p ex lap with LOA, leukocytosis. Postop for small-bowel obstruction EXAM: CT ABDOMEN AND PELVIS WITHOUT CONTRAST TECHNIQUE: Multidetector CT imaging of the abdomen and pelvis was performed following the standard protocol without IV contrast. RADIATION DOSE REDUCTION: This exam was performed according to the departmental dose-optimization program which includes automated exposure control, adjustment of the  mA and/or kV according to patient size and/or use of iterative reconstruction technique. COMPARISON:  01/09/2022 FINDINGS: Lower chest: Moderate to large bilateral pleural effusions, increasing since prior study. Compressive atelectasis in the lower lobes. Coronary artery and aortic calcifications. Small to moderate-sized hiatal hernia. Hepatobiliary: No focal liver abnormality  is seen. Status post cholecystectomy. No biliary dilatation. Pancreas: No focal abnormality or ductal dilatation. Spleen: No focal abnormality.  Normal size. Adrenals/Urinary Tract: No adrenal abnormality. No focal renal abnormality. No stones or hydronephrosis. Urinary bladder is unremarkable. Stomach/Bowel: Oral contrast material seen within the distal small bowel and colon. Decreasing gaseous distension of small bowel loops. Continued mild gaseous distention of pelvic small bowel loops measuring up to 3.3 cm in diameter. Stomach and large bowel unremarkable. Vascular/Lymphatic: Scattered aortic atherosclerosis. No evidence of aneurysm or adenopathy. Reproductive: Prior hysterectomy.  No adnexal masses. Other: No free fluid or free air. Edema throughout the subcutaneous soft tissues of the abdominal and pelvic wall compatible with anasarca. Musculoskeletal: No acute bony abnormality. Postoperative changes in the lower lumbar spine. IMPRESSION: Decreasing gaseous distension of small bowel loops with improving bowel gas pattern. Continued mild distention of a few lower abdominal/pelvic small bowel loops measuring up to 3.3 cm. Oral contrast material has passed into the distal small bowel and colon. Moderate to large bilateral pleural effusions, increasing since prior study. Compressive atelectasis in the lower lobes. Small to moderate hiatal hernia. Aortic atherosclerosis. Anasarca like edema throughout the abdominal wall. Electronically Signed   By: Rolm Baptise M.D.   On: 01/17/2022 21:29    Anti-infectives: Anti-infectives (From admission, onward)    Start     Dose/Rate Route Frequency Ordered Stop   01/16/22 2000  aztreonam (AZACTAM) 1 g in sodium chloride 0.9 % 100 mL IVPB        1 g 200 mL/hr over 30 Minutes Intravenous Every 8 hours 01/16/22 1840     01/16/22 2000  metroNIDAZOLE (FLAGYL) IVPB 500 mg        500 mg 100 mL/hr over 60 Minutes Intravenous Every 8 hours 01/16/22 1840     01/15/22 0526   aztreonam (AZACTAM) 1 g in sodium chloride 0.9 % 100 mL IVPB  Status:  Discontinued       See Hyperspace for full Linked Orders Report.   1 g 200 mL/hr over 30 Minutes Intravenous Every 24 hours 01/14/22 1529 01/14/22 1534   01/15/22 0526  aztreonam (AZACTAM) 1 g in sodium chloride 0.9 % 100 mL IVPB       See Hyperspace for full Linked Orders Report.   1 g 200 mL/hr over 30 Minutes Intravenous Every 24 hours 01/14/22 1534 01/15/22 0504   01/13/22 2100  vancomycin (VANCOCIN) IVPB 1000 mg/200 mL premix  Status:  Discontinued        1,000 mg 200 mL/hr over 60 Minutes Intravenous Every 24 hours 01/12/22 2135 01/13/22 1027   01/13/22 0500  aztreonam (AZACTAM) 2 g in sodium chloride 0.9 % 100 mL IVPB  Status:  Discontinued       See Hyperspace for full Linked Orders Report.   2 g 200 mL/hr over 30 Minutes Intravenous Every 12 hours 01/12/22 2135 01/14/22 1529   01/12/22 2015  vancomycin (VANCOREADY) IVPB 1750 mg/350 mL        1,750 mg 175 mL/hr over 120 Minutes Intravenous  Once 01/12/22 1921 01/12/22 2259   01/11/22 1800  aztreonam (AZACTAM) 1 g in sodium chloride 0.9 % 100 mL IVPB  Status:  Discontinued  See Hyperspace for full Linked Orders Report.   1 g 200 mL/hr over 30 Minutes Intravenous Every 8 hours 01/11/22 0815 01/12/22 2135   01/11/22 1000  levofloxacin (LEVAQUIN) IVPB 500 mg  Status:  Discontinued        500 mg 100 mL/hr over 60 Minutes Intravenous Every 48 hours 01/10/22 1432 01/11/22 0744   01/11/22 0915  aztreonam (AZACTAM) 2 g in sodium chloride 0.9 % 100 mL IVPB       See Hyperspace for full Linked Orders Report.   2 g 200 mL/hr over 30 Minutes Intravenous  Once 01/11/22 0815 01/11/22 1045   01/11/22 0845  aztreonam (AZACTAM) 2 g in sodium chloride 0.9 % 100 mL IVPB  Status:  Discontinued        2 g 200 mL/hr over 30 Minutes Intravenous  Once 01/11/22 0747 01/11/22 0815   01/11/22 0845  metroNIDAZOLE (FLAGYL) IVPB 500 mg  Status:  Discontinued        500 mg 100  mL/hr over 60 Minutes Intravenous Every 12 hours 01/11/22 0747 01/15/22 0851   01/10/22 1900  levofloxacin (LEVAQUIN) IVPB 500 mg  Status:  Discontinued        500 mg 100 mL/hr over 60 Minutes Intravenous Every 24 hours 01/10/22 1426 01/10/22 1432   01/10/22 0700  ciprofloxacin (CIPRO) IVPB 400 mg        400 mg 200 mL/hr over 60 Minutes Intravenous On call to O.R. 01/10/22 0612 01/10/22 1052   01/10/22 0700  metroNIDAZOLE (FLAGYL) IVPB 500 mg  Status:  Discontinued        500 mg 100 mL/hr over 60 Minutes Intravenous On call to O.R. 01/10/22 0612 01/10/22 1123   01/08/22 0830  Levofloxacin (LEVAQUIN) IVPB 250 mg  Status:  Discontinued        250 mg 50 mL/hr over 60 Minutes Intravenous Every 24 hours 01/08/22 0724 01/08/22 0727   01/08/22 0830  Levofloxacin (LEVAQUIN) IVPB 250 mg  Status:  Discontinued        250 mg 50 mL/hr over 60 Minutes Intravenous Every 48 hours 01/08/22 0727 01/08/22 0732   01/08/22 0830  Levofloxacin (LEVAQUIN) IVPB 250 mg  Status:  Discontinued        250 mg 50 mL/hr over 60 Minutes Intravenous Every 24 hours 01/08/22 0732 01/10/22 1426       Assessment/Plan: POD#9 - status post ex lap with lysis of adhesions - Dr. Constance Haw ([facility]) - Afebrile WBC 17.  CT scan negative for intra-abdominal infection.  -KEEP CLD for now  -may add flexi-seal if needed for diarrhea.  May also add gerhardt's butt cream if cleared by pharmacy due to an allergy.  -if lungs not felt to be etiology of possible WBC, could check c diff, but patient has no significant abdominal pain and no fever. -continue full support TPN Ileus FEN: NPO, TPN, replacement of electrolytes per renal and primary ID: aztreonam 8/18 >>8/22, 8/23 restarted and flagyl, no abx needed from surgery standpoint VTE: hep gtt Foley: purewick in place     Afib - IV hep, rate control meds per cards AKI - CRRT stopped 8/21, now with IHD ABL anemia - stable Volume overload/pleural effusions - per medicine   Total time  20 minutes diagnosis ileus Data reviewed - vitals x 24 hrs, nursing note x 24hrs, CCM note, renal note, hospitalist note, and cardiology note, labs, imaging, I/Os   LOS: 12 days    Turner Daniels MD  01/19/2022

## 2022-01-19 NOTE — Progress Notes (Signed)
PHARMACY - TOTAL PARENTERAL NUTRITION CONSULT NOTE   Indication: Prolonged ileus  Patient Measurements: Height: 5\' 4"  (162.6 cm) Weight: 105.8 kg (233 lb 4 oz) IBW/kg (Calculated) : 54.7 TPN AdjBW (KG): 60.7 Body mass index is 40.04 kg/m.  Assessment: 76 yo female with septic shock, high-grade small bowel obstruction. Underwent surgery on 8/17 with ex lap and lysis of adhesions for SBO. Reported vomiting and diarrhea since 8/11 up until surgery on 8/17. Pharmacy consulted for TPN due to prolonged ileus.  Glucose / Insulin: no hx DM - CBGs < 180.  SSI/CBG checked D/C'ed 01/18/22. Electrolytes: 8/26 labs post HD - low Na/CL 129/96, K 3.6 post ~5 runs (goal >/= 4), Mag 1.8 post 1gm (goal >/= 2), others WNL Renal: off CRRT 8/21 >> iHD using 4K baths, schedule pending Hepatic: AST / ALT / tbili / TG WNL, Alk Phos trending down to 128, albumin 1.8 Intake / Output; MIVF: UOP none documented, stool: -29mL  GI Imaging:  8/24: CT abd: decreasing gas distention, anasarca throughout abdominal wall, distention of lower abdominal/pelvic small bowel loops  8/23: persistent SBO vs. Post-operative ileus, suspected ascites  GI Surgeries / Procedures:  8/17 with ex lap and lysis of adhesions for SBO  Central access: triple lumen CVC placed 01/10/22 TPN start date: 01/14/22  Nutritional Goals:  RD Estimated Needs Total Energy Estimated Needs: 1700-1900 Total Protein Estimated Needs: 100-120 gm Total Fluid Estimated Needs: 1L + UOP  Current Nutrition:  TPN CLD Boost TIDBW - 1 charted given yesterday  Plan:  Continue concentrated TPN at goal rate of 65 ml/hr to provide 110g AA and 1811 kCal, meeting 100% of nutrition needs Electrolytes in TPN: increase Na to 138mEq/L, increase K 4mEq/L ( = 71mEq/d), increase Ca 64mEq/L on 8/25, increase Mg 84mEq/L, decrease Phos 18mmol/L on 8/25, max acetate for now Add standard MVI and trace elements to TPN - no chromium since on iHD KCL x 3 runs Mag sulfate 2gm  IV Monitor TPN labs on Mon/Thurs, and daily for next few days to trend pre/post HD labs F/u GoC, PO intake/diet advancement  Kyran Connaughton D. Mina Marble, PharmD, BCPS, [employer] 01/19/2022, 7:56 AM

## 2022-01-19 NOTE — Progress Notes (Signed)
Rounding Note    Patient Name: [name] Date of Encounter: 01/19/2022  [place] Cardiologist: Carlyle Dolly, MD   Subjective   NAEO.   Inpatient Medications    Scheduled Meds:  Chlorhexidine Gluconate Cloth  6 each Topical Daily   Chlorhexidine Gluconate Cloth  6 each Topical Q0600   escitalopram  10 mg Oral Daily   feeding supplement  1 Container Oral TID BM   metoprolol tartrate  12.5 mg Oral BID   sodium chloride flush  10-40 mL Intracatheter Q12H   sodium chloride flush  3 mL Intravenous Q12H   Continuous Infusions:  sodium chloride Stopped (01/16/22 1822)   sodium chloride Stopped (01/15/22 1255)   sodium chloride     amiodarone 30 mg/hr (01/19/22 0033)   aztreonam 1 g (01/19/22 0651)   heparin 1,850 Units/hr (01/19/22 0032)   magnesium sulfate bolus IVPB     metronidazole 500 mg (01/19/22 0402)   potassium chloride     TPN ADULT (ION) 65 mL/hr at 01/18/22 1631   TPN ADULT (ION)     PRN Meds: sodium chloride, Place/Maintain arterial line **AND** sodium chloride, acetaminophen, albuterol, alteplase, lip balm, [DISCONTINUED] ondansetron **OR** ondansetron (ZOFRAN) IV, mouth rinse, oxyCODONE, sodium chloride, sodium chloride flush, sodium chloride flush   Vital Signs    Vitals:   01/18/22 2003 01/18/22 2138 01/19/22 0428 01/19/22 0500  BP: 112/66 103/64 98/63   Pulse: 76 76 73   Resp: '16 20 15   '$ Temp: 98.4 F (36.9 C)  98.1 F (36.7 C)   TempSrc: Axillary  Oral   SpO2: 97% 97% 98%   Weight:    105.8 kg  Height:        Intake/Output Summary (Last 24 hours) at 01/19/2022 1013 Last data filed at 01/19/2022 0700 Gross per 24 hour  Intake 2654.46 ml  Output 561 ml  Net 2093.46 ml      01/19/2022    5:00 AM 01/18/2022    2:46 PM 01/15/2022    5:00 AM  Last 3 Weights  Weight (lbs) 233 lb 4 oz 232 lb 5.8 oz 208 lb 1.8 oz  Weight (kg) 105.8 kg 105.4 kg 94.4 kg      Telemetry    AF. Occasional PVCs.  - Personally Reviewed  ECG     Personally Reviewed  Physical Exam   GEN: Obese. On Supplemental Oxygen. Neck: unable to assess JVD due to body habitus Cardiac: irregularly irregular, no murmurs, rubs, or gallops.  Respiratory: Diffuse wheezing bilaterally. Expiratory. Crackles. GI: Soft, nontender, non-distended  MS: 2+ pitting bilateral LE edema to the thighs. 1+ pitting edema in the arms bilaterally.  Neuro:  Nonfocal  Psych: Normal affect   Labs    High Sensitivity Troponin:  No results for input(s): "TROPONINIHS" in the last 720 hours.   Chemistry Recent Labs  Lab 01/14/22 0400 01/14/22 1615 01/15/22 0431 01/16/22 0627 01/17/22 0115 01/17/22 1100 01/18/22 0707 01/18/22 1145 01/19/22 0515  NA 138 136 138 134*   < > 131* 126* 130* 129*  K 3.7 3.6 3.3* 3.3*   < > 3.1* 3.1* 3.3* 3.6  CL 109 107 107 106   < > 102 102 100 96*  CO2 20* '24 24 22   '$ < > 22 21* 25 24  GLUCOSE 158* 125* 98 113*   < > 120* 112* 145* 121*  BUN 31* 23 28* 43*   < > 41* 54* 39* 47*  CREATININE 2.08* 1.64* 2.09* 2.75*   < >  2.33* 2.57* 1.92* 2.19*  CALCIUM 7.4* 7.3* 7.4* 7.7*   < > 7.3* 7.1* 7.1* 7.4*  MG 2.4  --  2.1 2.1   < > 1.9 1.8  --  1.8  PROT 4.5*  --  4.1* 4.1*  --   --   --   --   --   ALBUMIN 1.9* 1.8* 1.8* 1.8*  --   --   --   --   --   AST 20  --  34 25  --   --   --   --   --   ALT 16  --  20 17  --   --   --   --   --   ALKPHOS 84  --  155* 128*  --   --   --   --   --   BILITOT 0.7  --  0.7 0.6  --   --   --   --   --   GFRNONAA 24* 32* 24* 17*   < > 21* 19* 27* 23*  ANIONGAP '9 5 7 6   '$ < > 7 3* 5 9   < > = values in this interval not displayed.    Lipids  Recent Labs  Lab 01/17/22 0536  TRIG 39    Hematology Recent Labs  Lab 01/17/22 0536 01/18/22 0707 01/19/22 0730  WBC 20.2* 19.1* 17.2*  RBC 2.91* 2.78* 2.61*  HGB 8.3* 7.9* 7.4*  HCT 25.8* 24.7* 22.9*  MCV 88.7 88.8 87.7  MCH 28.5 28.4 28.4  MCHC 32.2 32.0 32.3  RDW 18.9* 18.7* 18.8*  PLT 218 259 306   Thyroid No results for input(s):  "TSH", "FREET4" in the last 168 hours.  BNPNo results for input(s): "BNP", "PROBNP" in the last 168 hours.  DDimer No results for input(s): "DDIMER" in the last 168 hours.   Radiology    DG CHEST PORT 1 VIEW  Result Date: 01/18/2022 CLINICAL DATA:  [id number]; status post thoracentesis EXAM: PORTABLE CHEST 1 VIEW COMPARISON:  Chest x-ray dated January 16, 2022 FINDINGS: Again seen is left transjugular central venous catheter with its tip at the upper-mid SVC region and the right IJ central venous catheter with its tip at the proximal right atrium, stable. Cardiomegaly. There has been interval right thoracentesis. No pneumothorax. Left pleural effusion. Pulmonary edema. Likely superimposed left basilar atelectasis. Moderate thoracic spondylosis. IMPRESSION: There has been interval right thoracentesis. No pneumothorax. Cardiomegaly, pulmonary edema and left pleural effusion. Electronically Signed   By: Frazier Richards M.D.   On: 01/18/2022 17:55   CT ABDOMEN PELVIS WO CONTRAST  Result Date: 01/17/2022 CLINICAL DATA:  Abdominal pain, post-op s/p ex lap with LOA, leukocytosis. Postop for small-bowel obstruction EXAM: CT ABDOMEN AND PELVIS WITHOUT CONTRAST TECHNIQUE: Multidetector CT imaging of the abdomen and pelvis was performed following the standard protocol without IV contrast. RADIATION DOSE REDUCTION: This exam was performed according to the departmental dose-optimization program which includes automated exposure control, adjustment of the mA and/or kV according to patient size and/or use of iterative reconstruction technique. COMPARISON:  01/09/2022 FINDINGS: Lower chest: Moderate to large bilateral pleural effusions, increasing since prior study. Compressive atelectasis in the lower lobes. Coronary artery and aortic calcifications. Small to moderate-sized hiatal hernia. Hepatobiliary: No focal liver abnormality is seen. Status post cholecystectomy. No biliary dilatation. Pancreas: No focal abnormality or  ductal dilatation. Spleen: No focal abnormality.  Normal size. Adrenals/Urinary Tract: No adrenal abnormality. No focal renal abnormality. No stones  or hydronephrosis. Urinary bladder is unremarkable. Stomach/Bowel: Oral contrast material seen within the distal small bowel and colon. Decreasing gaseous distension of small bowel loops. Continued mild gaseous distention of pelvic small bowel loops measuring up to 3.3 cm in diameter. Stomach and large bowel unremarkable. Vascular/Lymphatic: Scattered aortic atherosclerosis. No evidence of aneurysm or adenopathy. Reproductive: Prior hysterectomy.  No adnexal masses. Other: No free fluid or free air. Edema throughout the subcutaneous soft tissues of the abdominal and pelvic wall compatible with anasarca. Musculoskeletal: No acute bony abnormality. Postoperative changes in the lower lumbar spine. IMPRESSION: Decreasing gaseous distension of small bowel loops with improving bowel gas pattern. Continued mild distention of a few lower abdominal/pelvic small bowel loops measuring up to 3.3 cm. Oral contrast material has passed into the distal small bowel and colon. Moderate to large bilateral pleural effusions, increasing since prior study. Compressive atelectasis in the lower lobes. Small to moderate hiatal hernia. Aortic atherosclerosis. Anasarca like edema throughout the abdominal wall. Electronically Signed   By: Rolm Baptise M.D.   On: 01/17/2022 21:29      Assessment & Plan    76yo woman with AF and septic shock.  #Persistent AF #PVC's Transition to PO amiodarone today Cont BB Heparin gtt  #Wheezing Message sent to primary team.  #Anasarca Volume management per nephrology and IM teams.  For questions or updates, please contact [facility] Please consult [email or web address] for contact info under        Signed, Vickie Epley, MD  01/19/2022, 10:13 AM

## 2022-01-19 NOTE — Progress Notes (Addendum)
Palliative Medicine Inpatient Follow Up Note HPI: [name]  is a 76 yo female with a PMHx: COPD, HLD, HTN, PE, hypothyroidism, OA and depression. Admitted 8/14 to [facility] with vomiting and diarrhea with partial SBO. 8/17 intubated and ex lap. 8/19 pt with worsening renal function, transfer to [facility] and CRRT started. 8/21 extubation with CRRT off.  Palliative care has been asked to get involved to have goals of care conversations in the setting of a prolonged and complicated hospitalization.  Today's Discussion 01/19/2022  *Please note that this is a verbal dictation therefore any spelling or grammatical errors are due to the "[employer]" system interpretation.  Chart reviewed inclusive of vital signs, progress notes, laboratory results, and diagnostic images.   I spoke to patients RN this morning, she shares that patient has been breathing better since thoracentesis. She shares concerns that patient sundown and is generally confused in the evening hours.   I met with [name] at bedside this morning. (+) some nausea though otherwise is well appearing.   Reviewed the plan to get out of bed today.   Goals remain for improvement.  Questions and concerns addressed/Palliative Support Provided.  _____________________________ Addendum:  I met with [name] this afternoon. She shares that she remains nauseated and has pain in her abdomen.   Dr. Erlinda Hong was present at bedside.   Discussed the importance of a family meeting which I will continue to try to coordinate. _____________________________ Addendum 2:  I met with patient's son [name] and daughter-in-law at bedside.  A review of patient's present medical state and what has occurred today was had.  I was able to attempt to get patient up to the side of the bed with patient's daughter-in-law though patient became severely fatigued after 5 minutes sitting at the side of the bed and pumping her legs.  She was gotten back into bed with the  nursing staff.  Outside of the room an open and honest conversation was held with patient's son [name] and I shared with him my concerns about patient's long-term outcomes as she has many things unfortunately which at the moment are working against her.  We talked about her lymphedema which at home was apparently worse and she was receiving therapy through lymphedema specialist as well as a compression device that she utilize daily.  I talked about speaking to the therapy teams to see if we could coordinate that while she is in house.  We discussed in patient's present physical state I worry she may not be able to tolerate outpatient dialysis which require sitting up in the chair for 4 hours.  We also discussed her ongoing volume overload and the trajectory of that.  The goals at this time are for improvement though family does understand if patient for any reason has an inability to tolerate hemodialysis that her time on earth will be short.  I shared if nephrology were not able to dialyze her that we would have little option other than keeping [name] comfortable and allowing her to be at peace when her time comes.  Patient's family understands her tenuous clinical state.  They also except that if it is her time to pass they would allow this though they are hopeful to give every effort within reason to provide her the best chance of life and improvement.  Additional time 45 minutes  Objective Assessment: Vital Signs Vitals:   01/18/22 2138 01/19/22 0428  BP: 103/64 98/63  Pulse: 76 73  Resp: 20 15  Temp:  98.1 F (36.7 C)  SpO2: 97% 98%    Intake/Output Summary (Last 24 hours) at 01/19/2022 1040 Last data filed at 01/19/2022 0700 Gross per 24 hour  Intake 2654.46 ml  Output 561 ml  Net 2093.46 ml    Last Weight  Most recent update: 01/19/2022  6:56 AM    Weight  105.8 kg (233 lb 4 oz)            Gen: Elderly Caucasian female HEENT: moist mucous membranes CV: Regular rate and  rhythm  PULM:  On 3LPM [place], breathing is even and nonlabored ABD: soft/nontender  EXT: (+) LE edema  Neuro: Alert and oriented x2  SUMMARY OF RECOMMENDATIONS   DNAR/DNI    Watchful waiting --> Trying to schedule a family meeting   Nausea --> Zofran Q6H PRN   Generalized weakness --> PT/OT, Needs daily mobility to chair  Delirium Precautions --> not present in need of antipsychotics   Patient's goals are for improvement --> has been told recovery will be slow and steady. Palliative reviewed best case and worst case with patient on first encounter   Ongoing palliative care support  Billing based on MDM: High ______________________________________________________________________________________ [facility] Team Team Cell Phone: [phone] Please utilize secure chat with additional questions, if there is no response within 30 minutes please call the above phone number  Palliative Medicine Team providers are available by phone from 7am to 7pm daily and can be reached through the team cell phone.  Should this patient require assistance outside of these hours, please call the patient's attending physician.

## 2022-01-19 NOTE — Progress Notes (Signed)
[place] for Heparin Indication: atrial fibrillation  Allergies  Allergen Reactions   Celebrex [Celecoxib] Swelling   Keflet [Cephalexin] Swelling   Penicillins Hives and Swelling    DID THE REACTION INVOLVE: Swelling of the face/tongue/throat, SOB, or low BP? Yes-swelling-hives Sudden or severe rash/hives, skin peeling, or the inside of the mouth or nose? Unknown Did it require medical treatment? Unknown When did it last happen?    over 10 years   If all above answers are "NO", may proceed with cephalosporin use.    Sulfa Antibiotics Swelling   Kenalog [Triamcinolone Acetonide]     unknown   Latex    Lisinopril Other (See Comments)    unknown   Mobic [Meloxicam]     SWELLING    Patient Measurements: Height: '5\' 4"'$  (162.6 cm) Weight: 105.8 kg (233 lb 4 oz) IBW/kg (Calculated) : 54.7  Heparin Dosing Weight: 72 kg  Vital Signs: Temp: 98.1 F (36.7 C) (08/26 0428) Temp Source: Oral (08/26 0428) BP: 98/63 (08/26 0428) Pulse Rate: 73 (08/26 0428)  Labs: Recent Labs    01/17/22 0536 01/17/22 1100 01/18/22 0707 01/18/22 1145 01/19/22 0515 01/19/22 0730  HGB 8.3*  --  7.9*  --   --  7.4*  HCT 25.8*  --  24.7*  --   --  22.9*  PLT 218  --  259  --   --  306  HEPARINUNFRC  --  0.30 0.63  --  0.45  --   CREATININE  --  2.33* 2.57* 1.92* 2.19*  --     Estimated Creatinine Clearance: 26.3 mL/min (A) (by C-G formula based on SCr of 2.19 mg/dL (H)).   Assessment: Pharmacy consulted to dose heparin infusion for post-op afib. Patient was not on any AC PTA.  She is noted s/p ex lap and on TPN.   Heparin level therapeutic at 0.45 on 1850 units/hr. Hgb 7.9 > 7.4, platelets WNL. No issues with infusion reported, no signs or symptoms of bleeding.  Goal of Therapy:  Heparin level 0.3-0.7 units/ml Monitor platelets by anticoagulation protocol: Yes   Plan:  Continue heparin infusion at 1850 units/hr Check daily heparin level and  CBC Continue to monitor for s/sx of bleeding    Thank you for allowing pharmacy to be a part of this patient's care.  Louanne Belton, PharmD, [facility] PGY1 Pharmacy Resident 01/19/2022 8:19 AM

## 2022-01-19 NOTE — Progress Notes (Signed)
PROGRESS NOTE    [name]  OAC:[id number] DOB: 1946-01-20 DOA: 01/07/2022 PCP: Chevis Pretty, FNP   Brief Narrative: 76 year old with past medical history hyperlipidemia, hypertension, hypothyroidism, PE in the past, osteoarthritis, depression who presents from home with nausea vomiting and diarrhea since 8/11.  CT scan was obtained and was unremarkable.  She received IV fluids and antiemetic and was discharged home.  Her vomiting and diarrhea persisted, she presented to [facility], CT scan repeated found to have partial small bowel obstruction and significant changes compared to CT done on 8/11. Admitted 80/14 for bowel obstruction and was managed medically.  She developed acute renal failure, developed shock and underwent emergent exploratory laparotomy with lysis of adhesion on 8/17.  Intubated for Sx.  She was started on CRRT.  She was transferred to [facility] 8/19.  She was subsequently extubated 8/21st, started on TNA, CRRT discontinued.  She was started on hemodialysis first section 8/23. During hospitalization she also developed A-fib with RVR, started on amiodarone, cardiology consulted.  In hemodialysis 8/23  she was noted to be more lethargic, she was noted to have aberrantly conducted A-fib with PVCs, versus slow rate  V. Tach.  IV amiodarone was resumed.  Patient develops worsening abdominal pain, CT scan negative for abscess, no significant bowel obstruction. Surgery will resume clear liquid diet. She was notice to have BL pleural effusion moderate. CCM consulted for evaluation for Thoracentesis.    Assessment & Plan:   Principal Problem:   Septic shock ([place]) Active Problems:   Respiratory failure requiring intubation ([place])   Small bowel obstruction (HCC)   COPD (chronic obstructive pulmonary disease) (HCC)   Hyperlipidemia   Hypothyroidism (acquired)   Essential hypertension   History of pulmonary embolus (PE)   AKI (acute kidney injury) ([place])   Acute  respiratory failure with hypoxia and hypercapnia (HCC)   Pressure injury of skin   Malnutrition of moderate degree   1-SBO status post laparotomy 8/17 NG tube has been removed.  Patient started on TPN 8/21 Treated course of aztreonam with metronidazole Surgery following Rectal tube place per surgery./ Having diarrhea.  Worsening Leukocytosis CT abdomen and pelvis 8/24: , due to abdominal pain and leukocytosis. Negative for abscess, decrease bowel distension small bowel loop.  Vicodin PRN for pain.  8/26 :Watery Diarrhea, check gi pcr panel and cdiff  2-Acute Metabolic Encephalopathy: She was more lethargic 8/23. ABG with hypoxia--- placed on oxygen CCM to evaluate, recommended support care.  Chest x ray: with pulmonary edema, PNA not excluded. IV antibiotics resume.  CT head. Negative.  Multifactorial secondary to delirium, acute illness.  Improved. She is more alert.   3-A-fib with RVR, PVC Versus slow rate  V. tach IV amiodarone  Replete Mg IV. Potassium corrected with HD>  On low dose metoprolol.  Appreciate cardiology assistance, ectopy during last HD related to electrolytes abnormalities.  If stable plan to transition to oral amiodarone.   4-Acute hypoxic respiratory failure; ? PNA Vs Pulmonary edema BL Pleural effusion; ABG Po2 58 Placed on oxygen.. Chest x ray : pulmonary edema vs infection.  Started IV Aztreonam and flagyl.  Volume manage with HD>  CT abdomen pelvis showed: Moderate to large bilateral pleural effusions. CCM consulted for evaluation of Thoracentesis.   AKI likely from ATN from sepsis Treated with CRRT and discontinue 80/21 Nephrology  following and initiating intermittent hemodialysis Tolerated better HB today, no significant ectopy.   5-Moderate protein malnutrition: Continue with TPN  Chronic anemia: Monitor hemoglobin  Hypokalemia, hypophosphatemia,:  Replete.   Leukocytosis: Chest x ray PNA vs edema.  CT abdomen pelvis. Negative for  abscess.  Check GI pathogen.  Plan for Thoracentesis.   See wound care documentation below  Pressure Injury 01/10/22 Coccyx Medial;Mid Stage 2 -  Partial thickness loss of dermis presenting as a shallow open injury with a red, pink wound bed without slough. pink (Active)  01/10/22 1200  Location: Coccyx  Location Orientation: Medial;Mid  Staging: Stage 2 -  Partial thickness loss of dermis presenting as a shallow open injury with a red, pink wound bed without slough.  Wound Description (Comments): pink  Present on Admission:   Dressing Type None 01/18/22 2000     Pressure Injury 01/12/22 Face Left Stage 2 -  Partial thickness loss of dermis presenting as a shallow open injury with a red, pink wound bed without slough. red ulceration beneath ETT holder on L cheek (Active)  01/12/22 1815  Location: Face  Location Orientation: Left  Staging: Stage 2 -  Partial thickness loss of dermis presenting as a shallow open injury with a red, pink wound bed without slough.  Wound Description (Comments): red ulceration beneath ETT holder on L cheek  Present on Admission: Yes  Dressing Type None 01/17/22 2140     Pressure Injury 01/16/22 Lumbar Left (Active)  01/16/22 1230  Location: Lumbar  Location Orientation: Left  Staging:   Wound Description (Comments):   Present on Admission: Yes  Dressing Type Foam - Lift dressing to assess site every shift 01/17/22 0800     Pressure Injury 01/16/22 Buttocks Left (Active)  01/16/22 1230  Location: Buttocks  Location Orientation: Left  Staging:   Wound Description (Comments):   Present on Admission: Yes  Dressing Type None 01/17/22 2140     Nutrition Problem: Moderate Malnutrition Etiology: acute illness (SBO)    Signs/Symptoms: energy intake < 75% for > 7 days, mild muscle depletion, moderate fat depletion    Interventions: TPN  Estimated body mass index is 40.04 kg/m as calculated from the following:   Height as of this encounter: 5'  4" (1.626 m).   Weight as of this encounter: 105.8 kg.   DVT prophylaxis: Heparin drip Code Status: Full code Family Communication: Son updated in person on  8/26  Disposition Plan:  Status is: Inpatient Remains inpatient appropriate because: Remain for management of SBO, status postsurgery, A-fib, renal failure    Consultants:  Nephrology CCM Surgery  Procedures:    Antimicrobials:    Subjective:   Reports feeling bloated, feeling nauseous , Rectal tube in place with greenish watery stool, On tpn and heparin drip  she is edematous, she is on 3liters, had one liter removed from iHD and 1liter removed from thoracentesis yesterday Some wheezing, occasional  cough noted during encounter    Appear very weak, and tired, but able to carries a conversation with me and palliative care at bedside    Objective: Vitals:   01/18/22 2003 01/18/22 2138 01/19/22 0428 01/19/22 0500  BP: 112/66 103/64 98/63   Pulse: 76 76 73   Resp: '16 20 15   '$ Temp: 98.4 F (36.9 C)  98.1 F (36.7 C)   TempSrc: Axillary  Oral   SpO2: 97% 97% 98%   Weight:    105.8 kg  Height:        Intake/Output Summary (Last 24 hours) at 01/19/2022 1142 Last data filed at 01/19/2022 0700 Gross per 24 hour  Intake 2654.46 ml  Output 561 ml  Net 2093.46 ml  Filed Weights   01/15/22 0500 01/18/22 1446 01/19/22 0500  Weight: 94.4 kg 105.4 kg 105.8 kg    Examination:  General exam: weak, generalized edema Respiratory system: mild bilateral end expiratory wheezing Cardiovascular system: S 1, S 2 RRR Gastrointestinal system: BS present, soft, nt, midline incision cover.  Central nervous system: She is alert, follows command Extremities: plus 2 edema.    Data Reviewed: I have personally reviewed following labs and imaging studies  CBC: Recent Labs  Lab 01/13/22 0448 01/14/22 0400 01/15/22 0431 01/16/22 0627 01/17/22 0536 01/18/22 0707 01/19/22 0730  WBC 14.7*   < > 18.8* 19.5* 20.2* 19.1*  17.2*  NEUTROABS 12.1*  --   --   --   --   --   --   HGB 10.2*   < > 8.4* 8.8* 8.3* 7.9* 7.4*  HCT 31.9*   < > 26.8* 28.2* 25.8* 24.7* 22.9*  MCV 87.6   < > 89.0 89.8 88.7 88.8 87.7  PLT 144*   < > 185 202 218 259 306   < > = values in this interval not displayed.   Basic Metabolic Panel: Recent Labs  Lab 01/16/22 0627 01/17/22 0115 01/17/22 0536 01/17/22 1100 01/18/22 0707 01/18/22 1145 01/19/22 0515  NA 134* 132*  --  131* 126* 130* 129*  K 3.3* 2.6*  --  3.1* 3.1* 3.3* 3.6  CL 106 102  --  102 102 100 96*  CO2 22 22  --  22 21* 25 24  GLUCOSE 113* 112*  --  120* 112* 145* 121*  BUN 43* 34*  --  41* 54* 39* 47*  CREATININE 2.75* 2.08*  --  2.33* 2.57* 1.92* 2.19*  CALCIUM 7.7* 7.1*  --  7.3* 7.1* 7.1* 7.4*  MG 2.1 1.5*  --  1.9 1.8  --  1.8  PHOS 1.4* 2.6 2.5  --  3.2  --  3.0   GFR: Estimated Creatinine Clearance: 26.3 mL/min (A) (by C-G formula based on SCr of 2.19 mg/dL (H)). Liver Function Tests: Recent Labs  Lab 01/12/22 1423 01/12/22 2123 01/13/22 1521 01/14/22 0400 01/14/22 1615 01/15/22 0431 01/16/22 0627  AST 27  --   --  20  --  34 25  ALT 15  --   --  16  --  20 17  ALKPHOS 61  --   --  84  --  155* 128*  BILITOT 0.9  --   --  0.7  --  0.7 0.6  PROT 4.4*  --   --  4.5*  --  4.1* 4.1*  ALBUMIN 2.0*   < > 1.9* 1.9* 1.8* 1.8* 1.8*   < > = values in this interval not displayed.   No results for input(s): "LIPASE", "AMYLASE" in the last 168 hours. No results for input(s): "AMMONIA" in the last 168 hours. Coagulation Profile: No results for input(s): "INR", "PROTIME" in the last 168 hours. Cardiac Enzymes: No results for input(s): "CKTOTAL", "CKMB", "CKMBINDEX", "TROPONINI" in the last 168 hours. BNP (last 3 results) No results for input(s): "PROBNP" in the last 8760 hours. HbA1C: No results for input(s): "HGBA1C" in the last 72 hours. CBG: Recent Labs  Lab 01/18/22 0646 01/18/22 1638 01/18/22 2132 01/19/22 0426 01/19/22 0811  GLUCAP 107*  105* 124* 120* 112*   Lipid Profile: Recent Labs    01/17/22 0536  TRIG 39   Thyroid Function Tests: No results for input(s): "TSH", "T4TOTAL", "FREET4", "T3FREE", "THYROIDAB" in the last 72 hours. Anemia Panel:  No results for input(s): "VITAMINB12", "FOLATE", "FERRITIN", "TIBC", "IRON", "RETICCTPCT" in the last 72 hours. Sepsis Labs: No results for input(s): "PROCALCITON", "LATICACIDVEN" in the last 168 hours.   Recent Results (from the past 240 hour(s))  Surgical pcr screen     Status: None   Collection Time: 01/10/22  6:20 AM   Specimen: Nasal Mucosa; Nasal Swab  Result Value Ref Range Status   MRSA, PCR NEGATIVE NEGATIVE Final   Staphylococcus aureus NEGATIVE NEGATIVE Final    Comment: (NOTE) The Xpert SA Assay (FDA approved for NASAL specimens in patients 55 years of age and older), is one component of a comprehensive surveillance program. It is not intended to diagnose infection nor to guide or monitor treatment. Performed at [facility], [address] [place], [place] [address]   Culture, blood (Routine X 2) w Reflex to ID Panel     Status: None   Collection Time: 01/10/22 12:55 PM   Specimen: BLOOD  Result Value Ref Range Status   Specimen Description BLOOD LEFT ANTECUBITAL  Final   Special Requests   Final    BOTTLES DRAWN AEROBIC AND ANAEROBIC Blood Culture results may not be optimal due to an excessive volume of blood received in culture bottles   Culture   Final    NO GROWTH 5 DAYS Performed at [facility], [address] [place], [place] [address]    Report Status 01/15/2022 FINAL  Final  Culture, blood (Routine X 2) w Reflex to ID Panel     Status: None   Collection Time: 01/10/22 12:55 PM   Specimen: BLOOD  Result Value Ref Range Status   Specimen Description BLOOD BLOOD RIGHT HAND  Final   Special Requests   Final    BOTTLES DRAWN AEROBIC ONLY Blood Culture adequate volume   Culture   Final    NO GROWTH 5 DAYS Performed at [facility], [address] [place], [place] [address]    Report Status 01/15/2022 FINAL  Final  MRSA Next Gen by PCR, Nasal     Status: None   Collection Time: 01/10/22  1:11 PM   Specimen: Nasal Mucosa; Nasal Swab  Result Value Ref Range Status   MRSA by PCR Next Gen NOT DETECTED NOT DETECTED Final    Comment: (NOTE) The GeneXpert MRSA Assay (FDA approved for NASAL specimens only), is one component of a comprehensive MRSA colonization surveillance program. It is not intended to diagnose MRSA infection nor to guide or monitor treatment for MRSA infections. Test performance is not FDA approved in patients less than 46 years old. Performed at [facility], [address], [place], [place] [address]   Culture, Respiratory w Gram Stain     Status: None   Collection Time: 01/12/22  6:14 PM   Specimen: Tracheal Aspirate; Respiratory  Result Value Ref Range Status   Specimen Description TRACHEAL ASPIRATE  Final   Special Requests NONE  Final   Gram Stain NO WBC SEEN NO ORGANISMS SEEN   Final   Culture   Final    RARE Normal respiratory flora-no Staph aureus or Pseudomonas seen Performed at [facility], [address], [place],  [address]    Report Status 01/14/2022 FINAL  Final  MRSA Next Gen by PCR, Nasal     Status: None   Collection Time: 01/12/22  6:50 PM   Specimen: Nasal Mucosa; Nasal Swab  Result Value Ref Range Status   MRSA by PCR Next Gen NOT DETECTED NOT DETECTED  Final    Comment: (NOTE) The GeneXpert MRSA Assay (FDA approved for NASAL specimens only), is one component of a comprehensive MRSA colonization surveillance program. It is not intended to diagnose MRSA infection nor to guide or monitor treatment for MRSA infections. Test performance is not FDA approved in patients less than 49 years old. Performed at [facility], [place] [address], [place], [place] [address]          Radiology Studies: DG CHEST PORT 1 VIEW  Result Date: 01/18/2022 CLINICAL DATA:  [id number];  status post thoracentesis EXAM: PORTABLE CHEST 1 VIEW COMPARISON:  Chest x-ray dated January 16, 2022 FINDINGS: Again seen is left transjugular central venous catheter with its tip at the upper-mid SVC region and the right IJ central venous catheter with its tip at the proximal right atrium, stable. Cardiomegaly. There has been interval right thoracentesis. No pneumothorax. Left pleural effusion. Pulmonary edema. Likely superimposed left basilar atelectasis. Moderate thoracic spondylosis. IMPRESSION: There has been interval right thoracentesis. No pneumothorax. Cardiomegaly, pulmonary edema and left pleural effusion. Electronically Signed   By: Frazier Richards M.D.   On: 01/18/2022 17:55   CT ABDOMEN PELVIS WO CONTRAST  Result Date: 01/17/2022 CLINICAL DATA:  Abdominal pain, post-op s/p ex lap with LOA, leukocytosis. Postop for small-bowel obstruction EXAM: CT ABDOMEN AND PELVIS WITHOUT CONTRAST TECHNIQUE: Multidetector CT imaging of the abdomen and pelvis was performed following the standard protocol without IV contrast. RADIATION DOSE REDUCTION: This exam was performed according to the departmental dose-optimization program which includes automated exposure control, adjustment of the mA and/or kV according to patient size and/or use of iterative reconstruction technique. COMPARISON:  01/09/2022 FINDINGS: Lower chest: Moderate to large bilateral pleural effusions, increasing since prior study. Compressive atelectasis in the lower lobes. Coronary artery and aortic calcifications. Small to moderate-sized hiatal hernia. Hepatobiliary: No focal liver abnormality is seen. Status post cholecystectomy. No biliary dilatation. Pancreas: No focal abnormality or ductal dilatation. Spleen: No focal abnormality.  Normal size. Adrenals/Urinary Tract: No adrenal abnormality. No focal renal abnormality. No stones or hydronephrosis. Urinary bladder is unremarkable. Stomach/Bowel: Oral contrast material seen within the distal small  bowel and colon. Decreasing gaseous distension of small bowel loops. Continued mild gaseous distention of pelvic small bowel loops measuring up to 3.3 cm in diameter. Stomach and large bowel unremarkable. Vascular/Lymphatic: Scattered aortic atherosclerosis. No evidence of aneurysm or adenopathy. Reproductive: Prior hysterectomy.  No adnexal masses. Other: No free fluid or free air. Edema throughout the subcutaneous soft tissues of the abdominal and pelvic wall compatible with anasarca. Musculoskeletal: No acute bony abnormality. Postoperative changes in the lower lumbar spine. IMPRESSION: Decreasing gaseous distension of small bowel loops with improving bowel gas pattern. Continued mild distention of a few lower abdominal/pelvic small bowel loops measuring up to 3.3 cm. Oral contrast material has passed into the distal small bowel and colon. Moderate to large bilateral pleural effusions, increasing since prior study. Compressive atelectasis in the lower lobes. Small to moderate hiatal hernia. Aortic atherosclerosis. Anasarca like edema throughout the abdominal wall. Electronically Signed   By: Rolm Baptise M.D.   On: 01/17/2022 21:29        Scheduled Meds:  amiodarone  400 mg Oral BID   Followed by   Derrill Memo ON 01/23/2022] amiodarone  400 mg Oral Daily   Followed by   Derrill Memo ON 01/27/2022] amiodarone  200 mg Oral Daily   Chlorhexidine Gluconate Cloth  6 each Topical Daily   Chlorhexidine Gluconate Cloth  6 each Topical Q0600  escitalopram  10 mg Oral Daily   feeding supplement  1 Container Oral TID BM   metoprolol tartrate  12.5 mg Oral BID   sodium chloride flush  10-40 mL Intracatheter Q12H   sodium chloride flush  3 mL Intravenous Q12H   Continuous Infusions:  sodium chloride Stopped (01/16/22 1822)   sodium chloride Stopped (01/15/22 1255)   sodium chloride     aztreonam 1 g (01/19/22 0651)   heparin 1,850 Units/hr (01/19/22 0032)   magnesium sulfate bolus IVPB     metronidazole 500 mg  (01/19/22 0402)   potassium chloride 10 mEq (01/19/22 1043)   TPN ADULT (ION) 65 mL/hr at 01/18/22 1631   TPN ADULT (ION)       LOS: 12 days        Florencia Reasons, MD PhD FACP [facility]   If 7PM-7AM, please contact night-coverage [email or web address]  01/19/2022, 11:42 AM

## 2022-01-20 ENCOUNTER — Inpatient Hospital Stay (HOSPITAL_COMMUNITY): Payer: Medicare Other

## 2022-01-20 DIAGNOSIS — R6521 Severe sepsis with septic shock: Secondary | ICD-10-CM | POA: Diagnosis not present

## 2022-01-20 DIAGNOSIS — Z515 Encounter for palliative care: Secondary | ICD-10-CM | POA: Diagnosis not present

## 2022-01-20 DIAGNOSIS — A419 Sepsis, unspecified organism: Secondary | ICD-10-CM | POA: Diagnosis not present

## 2022-01-20 LAB — HEPARIN LEVEL (UNFRACTIONATED): Heparin Unfractionated: 0.51 IU/mL (ref 0.30–0.70)

## 2022-01-20 LAB — GLUCOSE, CAPILLARY
Glucose-Capillary: 100 mg/dL — ABNORMAL HIGH (ref 70–99)
Glucose-Capillary: 102 mg/dL — ABNORMAL HIGH (ref 70–99)
Glucose-Capillary: 107 mg/dL — ABNORMAL HIGH (ref 70–99)
Glucose-Capillary: 113 mg/dL — ABNORMAL HIGH (ref 70–99)
Glucose-Capillary: 99 mg/dL (ref 70–99)

## 2022-01-20 LAB — GASTROINTESTINAL PANEL BY PCR, STOOL (REPLACES STOOL CULTURE)

## 2022-01-20 LAB — PHOSPHORUS: Phosphorus: 3.8 mg/dL (ref 2.5–4.6)

## 2022-01-20 LAB — CBC
HCT: 23.1 % — ABNORMAL LOW (ref 36.0–46.0)
Hemoglobin: 7.2 g/dL — ABNORMAL LOW (ref 12.0–15.0)
MCH: 27.9 pg (ref 26.0–34.0)
MCHC: 31.2 g/dL (ref 30.0–36.0)
MCV: 89.5 fL (ref 80.0–100.0)
Platelets: 354 10*3/uL (ref 150–400)
RBC: 2.58 MIL/uL — ABNORMAL LOW (ref 3.87–5.11)
RDW: 18.9 % — ABNORMAL HIGH (ref 11.5–15.5)
WBC: 14.3 10*3/uL — ABNORMAL HIGH (ref 4.0–10.5)
nRBC: 0 % (ref 0.0–0.2)

## 2022-01-20 LAB — BASIC METABOLIC PANEL
Anion gap: 7 (ref 5–15)
BUN: 60 mg/dL — ABNORMAL HIGH (ref 8–23)
CO2: 25 mmol/L (ref 22–32)
Calcium: 7.5 mg/dL — ABNORMAL LOW (ref 8.9–10.3)
Chloride: 97 mmol/L — ABNORMAL LOW (ref 98–111)
Creatinine, Ser: 2.43 mg/dL — ABNORMAL HIGH (ref 0.44–1.00)
GFR, Estimated: 20 mL/min — ABNORMAL LOW (ref >60)
Glucose, Bld: 110 mg/dL — ABNORMAL HIGH (ref 70–99)
Potassium: 3.9 mmol/L (ref 3.5–5.1)
Sodium: 129 mmol/L — ABNORMAL LOW (ref 135–145)

## 2022-01-20 LAB — MAGNESIUM: Magnesium: 2 mg/dL (ref 1.7–2.4)

## 2022-01-20 MED ORDER — TRACE MINERALS CU-MN-SE-ZN 300-55-60-3000 MCG/ML IV SOLN
INTRAVENOUS | Status: AC
  Filled 2022-01-20: qty 738.4

## 2022-01-20 NOTE — Progress Notes (Signed)
Patient ID: [name], female   DOB: [birth year], 76 y.o.   MRN: [id number] [facility] Progress Note   Subjective:    Axnious Palliative notes reveiwed K 3.9 On 3L [place] No sig UOP    Objective:   BP 105/84   Pulse 65   Temp 98 F (36.7 C) (Oral)   Resp 11   Ht '5\' 4"'$  (1.626 m)   Wt 107.4 kg   SpO2 100%   BMI 40.64 kg/m    Physical Exam: Gen: Frail and debilitated, In bed resting CVS: Pulse regular rhythm, normal rate, S1 and S2 normal Resp: scattered exp wheezing, nl WOB Abd: Soft, honeycomb dressing in place, no drainage noted Ext: Trace bilateral lower extremity edema with lichenification of skin noted, poor skin turgo L IJ Temp Cath C/D/I   Assessment/ Plan:   1. Dialysis dependent anuric Acute kidney Injury -  baseline renal function essentially normal with a creatinine of 1.09 anuric ischemic ATN from high-grade small bowel obstruction, likely third spacing and sustained renal hypoperfusion.  She was on ARB prior to admission.   Off pressors CRRT stop 01/14/22 HD first Tx 8/23, did marginally but better on 8/25 Next tx 8/28 tentatively: 4K 3.5h, 2L UF, no heparin Palliative following  2.  Septic shock - high-grade small bowel obstruction s/p exploratory laparotomy with adhesiolysis.  Off pressors  3.  High-grade small bowel obstruction: Status post exploratory laparotomy by Dr. Constance Haw with lysis of additions.  CCS following. On TPN   4.  Anion gap metabolic acidosis: Resolved.    5. Hypophosphatemia: nutritional, has some P into TPN; better  6. Hypokalemia Nutritional GI losses, as above   Recent Labs  Lab 01/15/22 0431 01/16/22 0627 01/17/22 0115 01/19/22 0515 01/19/22 0730 01/20/22 0343  HGB 8.4* 8.8*   < >  --  7.4* 7.2*  ALBUMIN 1.8* 1.8*  --   --   --   --   CALCIUM 7.4* 7.7*   < > 7.4*  --  7.5*  PHOS 2.0* 1.4*   < > 3.0  --  3.8  CREATININE 2.09* 2.75*   < > 2.19*  --  2.43*  K 3.3* 3.3*   < > 3.6  --  3.9   < > = values in  this interval not displayed.     Medications:     amiodarone  400 mg Oral BID   Followed by   Derrill Memo ON 01/23/2022] amiodarone  400 mg Oral Daily   Followed by   Derrill Memo ON 01/27/2022] amiodarone  200 mg Oral Daily   Chlorhexidine Gluconate Cloth  6 each Topical Q0600   escitalopram  10 mg Oral Daily   feeding supplement  1 Container Oral TID BM   metoprolol tartrate  12.5 mg Oral BID   sodium chloride flush  10-40 mL Intracatheter Q12H   sodium chloride flush  3 mL Intravenous Q12H   Rexene Agent, MD  [facility]

## 2022-01-20 NOTE — Progress Notes (Addendum)
GI PCR came back positive for sugar like producing toxin E.coli. Enteric precaution continued. Dr. Erlinda Hong notified in person. Patient's plan of care on going.

## 2022-01-20 NOTE — Progress Notes (Signed)
[place] for Heparin Indication: atrial fibrillation  Allergies  Allergen Reactions   Celebrex [Celecoxib] Swelling   Keflet [Cephalexin] Swelling   Penicillins Hives and Swelling    DID THE REACTION INVOLVE: Swelling of the face/tongue/throat, SOB, or low BP? Yes-swelling-hives Sudden or severe rash/hives, skin peeling, or the inside of the mouth or nose? Unknown Did it require medical treatment? Unknown When did it last happen?    over 10 years   If all above answers are "NO", may proceed with cephalosporin use.    Sulfa Antibiotics Swelling   Kenalog [Triamcinolone Acetonide]     unknown   Latex    Lisinopril Other (See Comments)    unknown   Mobic [Meloxicam]     SWELLING    Patient Measurements: Height: '5\' 4"'$  (162.6 cm) Weight: 107.4 kg (236 lb 12.4 oz) IBW/kg (Calculated) : 54.7  Heparin Dosing Weight: 72 kg  Vital Signs: Temp: 98 F (36.7 C) (08/27 0405) Temp Source: Oral (08/27 0405) BP: 105/84 (08/27 0825) Pulse Rate: 65 (08/27 0825)  Labs: Recent Labs    01/18/22 0707 01/18/22 1145 01/19/22 0515 01/19/22 0730 01/20/22 0343  HGB 7.9*  --   --  7.4* 7.2*  HCT 24.7*  --   --  22.9* 23.1*  PLT 259  --   --  306 354  HEPARINUNFRC 0.63  --  0.45  --  0.51  CREATININE 2.57* 1.92* 2.19*  --  2.43*    Estimated Creatinine Clearance: 23.9 mL/min (A) (by C-G formula based on SCr of 2.43 mg/dL (H)).   Assessment: Pharmacy consulted to dose heparin infusion for post-op afib. Patient was not on any AC PTA.  She is noted s/p ex lap and on TPN.   Heparin level therapeutic at 0.51 on 1850 units/hr. Hgb 7.4 > 7.2, platelets WNL. No issues with infusion reported, no signs or symptoms of bleeding.  Goal of Therapy:  Heparin level 0.3-0.7 units/ml Monitor platelets by anticoagulation protocol: Yes   Plan:  Continue heparin infusion at 1850 units/hr Check daily heparin level and CBC Continue to monitor for s/sx of  bleeding    Thank you for allowing pharmacy to be a part of this patient's care.  Louanne Belton, PharmD, [facility] PGY1 Pharmacy Resident 01/20/2022 10:51 AM

## 2022-01-20 NOTE — Progress Notes (Signed)
2 Days Post-Op   Subjective/Chief Complaint: Patient nauseated at times with pills Having some liquid BMs with flexiseal C diff neg.   Objective: Vital signs in last 24 hours: Temp:  [98 F (36.7 C)-98.3 F (36.8 C)] 98 F (36.7 C) (08/27 0405) Pulse Rate:  [65-75] 65 (08/27 0825) Resp:  [11-18] 11 (08/27 0405) BP: (105-116)/(60-84) 105/84 (08/27 0825) SpO2:  [91 %-100 %] 100 % (08/27 0405) Weight:  [107.4 kg] 107.4 kg (08/27 0611) Last BM Date : 01/19/22 (flexiseal)  Intake/Output from previous day: 08/26 0701 - 08/27 0700 In: 2354.4 [I.V.:1691.1; IV Piggyback:633.2] Out: 645 [Stool:645] Intake/Output this shift: No intake/output data recorded.     Lungs: normal effort on [place] Abd: soft, ND, honeycomb dressing c/d/I; overall minimally tender on exam  Extremities: BUE and BLE edema  Lab Results:  Recent Labs    01/19/22 0730 01/20/22 0343  WBC 17.2* 14.3*  HGB 7.4* 7.2*  HCT 22.9* 23.1*  PLT 306 354    BMET Recent Labs    01/19/22 0515 01/20/22 0343  NA 129* 129*  K 3.6 3.9  CL 96* 97*  CO2 24 25  GLUCOSE 121* 110*  BUN 47* 60*  CREATININE 2.19* 2.43*  CALCIUM 7.4* 7.5*    PT/INR No results for input(s): "LABPROT", "INR" in the last 72 hours. ABG No results for input(s): "PHART", "HCO3" in the last 72 hours.  Invalid input(s): "PCO2", "PO2"   Studies/Results: DG CHEST PORT 1 VIEW  Result Date: 01/20/2022 CLINICAL DATA:  Pleural effusion EXAM: PORTABLE CHEST 1 VIEW COMPARISON:  January 18, 2022 FINDINGS: The right and left PICC lines are stable. No pneumothorax. Interstitial opacities remain in the lungs, mildly improved. Stable cardiomegaly. The hila and mediastinum are unchanged. There is a left-sided pleural effusion with underlying opacity. No other changes. IMPRESSION: 1. Cardiomegaly and mild edema.  The edema appears mildly improved. 2. Probable tiny right effusion. Moderate left effusion with underlying opacity, likely atelectasis. 3. No  other change. Electronically Signed   By: Dorise Bullion III M.D.   On: 01/20/2022 08:26   DG CHEST PORT 1 VIEW  Result Date: 01/18/2022 CLINICAL DATA:  [id number]; status post thoracentesis EXAM: PORTABLE CHEST 1 VIEW COMPARISON:  Chest x-ray dated January 16, 2022 FINDINGS: Again seen is left transjugular central venous catheter with its tip at the upper-mid SVC region and the right IJ central venous catheter with its tip at the proximal right atrium, stable. Cardiomegaly. There has been interval right thoracentesis. No pneumothorax. Left pleural effusion. Pulmonary edema. Likely superimposed left basilar atelectasis. Moderate thoracic spondylosis. IMPRESSION: There has been interval right thoracentesis. No pneumothorax. Cardiomegaly, pulmonary edema and left pleural effusion. Electronically Signed   By: Frazier Richards M.D.   On: 01/18/2022 17:55    Anti-infectives: Anti-infectives (From admission, onward)    Start     Dose/Rate Route Frequency Ordered Stop   01/16/22 2000  aztreonam (AZACTAM) 1 g in sodium chloride 0.9 % 100 mL IVPB        1 g 200 mL/hr over 30 Minutes Intravenous Every 8 hours 01/16/22 1840     01/16/22 2000  metroNIDAZOLE (FLAGYL) IVPB 500 mg        500 mg 100 mL/hr over 60 Minutes Intravenous Every 8 hours 01/16/22 1840     01/15/22 0526  aztreonam (AZACTAM) 1 g in sodium chloride 0.9 % 100 mL IVPB  Status:  Discontinued       See Hyperspace for full Linked Orders Report.   1  g 200 mL/hr over 30 Minutes Intravenous Every 24 hours 01/14/22 1529 01/14/22 1534   01/15/22 0526  aztreonam (AZACTAM) 1 g in sodium chloride 0.9 % 100 mL IVPB       See Hyperspace for full Linked Orders Report.   1 g 200 mL/hr over 30 Minutes Intravenous Every 24 hours 01/14/22 1534 01/15/22 0504   01/13/22 2100  vancomycin (VANCOCIN) IVPB 1000 mg/200 mL premix  Status:  Discontinued        1,000 mg 200 mL/hr over 60 Minutes Intravenous Every 24 hours 01/12/22 2135 01/13/22 1027   01/13/22 0500   aztreonam (AZACTAM) 2 g in sodium chloride 0.9 % 100 mL IVPB  Status:  Discontinued       See Hyperspace for full Linked Orders Report.   2 g 200 mL/hr over 30 Minutes Intravenous Every 12 hours 01/12/22 2135 01/14/22 1529   01/12/22 2015  vancomycin (VANCOREADY) IVPB 1750 mg/350 mL        1,750 mg 175 mL/hr over 120 Minutes Intravenous  Once 01/12/22 1921 01/12/22 2259   01/11/22 1800  aztreonam (AZACTAM) 1 g in sodium chloride 0.9 % 100 mL IVPB  Status:  Discontinued       See Hyperspace for full Linked Orders Report.   1 g 200 mL/hr over 30 Minutes Intravenous Every 8 hours 01/11/22 0815 01/12/22 2135   01/11/22 1000  levofloxacin (LEVAQUIN) IVPB 500 mg  Status:  Discontinued        500 mg 100 mL/hr over 60 Minutes Intravenous Every 48 hours 01/10/22 1432 01/11/22 0744   01/11/22 0915  aztreonam (AZACTAM) 2 g in sodium chloride 0.9 % 100 mL IVPB       See Hyperspace for full Linked Orders Report.   2 g 200 mL/hr over 30 Minutes Intravenous  Once 01/11/22 0815 01/11/22 1045   01/11/22 0845  aztreonam (AZACTAM) 2 g in sodium chloride 0.9 % 100 mL IVPB  Status:  Discontinued        2 g 200 mL/hr over 30 Minutes Intravenous  Once 01/11/22 0747 01/11/22 0815   01/11/22 0845  metroNIDAZOLE (FLAGYL) IVPB 500 mg  Status:  Discontinued        500 mg 100 mL/hr over 60 Minutes Intravenous Every 12 hours 01/11/22 0747 01/15/22 0851   01/10/22 1900  levofloxacin (LEVAQUIN) IVPB 500 mg  Status:  Discontinued        500 mg 100 mL/hr over 60 Minutes Intravenous Every 24 hours 01/10/22 1426 01/10/22 1432   01/10/22 0700  ciprofloxacin (CIPRO) IVPB 400 mg        400 mg 200 mL/hr over 60 Minutes Intravenous On call to O.R. 01/10/22 0612 01/10/22 1052   01/10/22 0700  metroNIDAZOLE (FLAGYL) IVPB 500 mg  Status:  Discontinued        500 mg 100 mL/hr over 60 Minutes Intravenous On call to O.R. 01/10/22 0612 01/10/22 1123   01/08/22 0830  Levofloxacin (LEVAQUIN) IVPB 250 mg  Status:  Discontinued         250 mg 50 mL/hr over 60 Minutes Intravenous Every 24 hours 01/08/22 0724 01/08/22 0727   01/08/22 0830  Levofloxacin (LEVAQUIN) IVPB 250 mg  Status:  Discontinued        250 mg 50 mL/hr over 60 Minutes Intravenous Every 48 hours 01/08/22 0727 01/08/22 0732   01/08/22 0830  Levofloxacin (LEVAQUIN) IVPB 250 mg  Status:  Discontinued        250 mg 50 mL/hr over 60  Minutes Intravenous Every 24 hours 01/08/22 0732 01/10/22 1426       Assessment/Plan: POD#10 - status post ex lap with lysis of adhesions - Dr. Constance Haw ([facility]) - Afebrile WBC down to 14.  CT scan negative for intra-abdominal infection.   -may add flexi-seal if needed for diarrhea.  May also add gerhardt's butt cream if cleared by pharmacy due to an allergy.  -c diff neg -continue full support TPN Ileus FEN: CLD, TPN, replacement of electrolytes per renal and primary; I think we could adv to FLD + nephro shakes ID: aztreonam 8/18 >>8/22, 8/23 restarted and flagyl, no abx needed from surgery standpoint VTE: hep gtt Foley: purewick in place     Afib - IV hep, rate control meds per cards AKI - CRRT stopped 8/21, now with IHD ABL anemia - stable Volume overload/pleural effusions - per medicine   Total time 20 minutes diagnosis ileus Data reviewed - vitals x 24 hrs, nursing note x 24hrs, CCM note, renal note, hospitalist note, and cardiology note, labs, imaging, I/Os   LOS: 13 days    Greer Pickerel MD  01/20/2022

## 2022-01-20 NOTE — Progress Notes (Addendum)
Palliative Medicine Inpatient Follow Up Note HPI: [name]  is a 76 yo female with a PMHx: COPD, HLD, HTN, PE, hypothyroidism, OA and depression. Admitted 8/14 to [facility] with vomiting and diarrhea with partial SBO. 8/17 intubated and ex lap. 8/19 pt with worsening renal function, transfer to [facility] and CRRT started. 8/21 extubation with CRRT off.  Palliative care has been asked to get involved to have goals of care conversations in the setting of a prolonged and complicated hospitalization.  Today's Discussion 01/20/2022  *Please note that this is a verbal dictation therefore any spelling or grammatical errors are due to the "[place] One" system interpretation.  Chart reviewed inclusive of vital signs, progress notes, laboratory results, and diagnostic images. C.Diff (-).  I met with [name] at bedside this morning. She was awake and alert. She shares that she actually slept overnight. She expresses that she received treatment to her legs for her lymphedema which she feels was helpful. [name] states that she only feels nauseated today after she receives medications and with an antiemetic it resolves. She shares that she feels her breathing is improved this morning. She tells me her abdominal pain has lessened. She is looking forward to mobilizing more in the near future.   I spoke to PT who shared that the director of the department is a lymphedema specialist. She agree's with patient bringing in her own device.   I called patients son, [name] who shares they brought patients machine from home for her lymphedema. He expresses gratitude that Dr. Erlinda Hong came by last night to provide a comprehensive update.   At this time the goals remain to continue current care for improvement.   Objective Assessment: Vital Signs Vitals:   01/20/22 0405 01/20/22 0825  BP: 105/66 105/84  Pulse:  65  Resp: 11   Temp: 98 F (36.7 C)   SpO2: 100%     Intake/Output Summary (Last 24 hours) at 01/20/2022  1149 Last data filed at 01/20/2022 0349 Gross per 24 hour  Intake 2354.38 ml  Output 645 ml  Net 1709.38 ml    Last Weight  Most recent update: 01/20/2022  6:11 AM    Weight  107.4 kg (236 lb 12.4 oz)            Gen: Elderly Caucasian female HEENT: moist mucous membranes CV: Regular rate and rhythm  PULM:  On 3LPM [place], breathing is even and nonlabored ABD: soft/nontender  EXT: (+) LE edema  Neuro: Alert and oriented x2  SUMMARY OF RECOMMENDATIONS   DNAR/DNI    Watchful waiting --> Had a goals meeting with patient, son, and DIL on 8/26  Nausea --> Zofran Q6H PRN   Generalized weakness --> PT/OT, Needs daily mobility to chair --> Patient has lymphedema and was being treated by a specialist. Her machine is now at bedside.   Delirium Precautions --> no present in need of antipsychotics   Patient's goals are for improvement --> has been told recovery will be slow and steady. Palliative reviewed best case and worst case with patient on first encounter and patients son on 8/26   Ongoing palliative care support  Billing based on MDM: High ______________________________________________________________________________________ [place] Team Team Cell Phone: [phone] Please utilize secure chat with additional questions, if there is no response within 30 minutes please call the above phone number  Palliative Medicine Team providers are available by phone from 7am to 7pm daily and can be reached through the team cell  phone.  Should this patient require assistance outside of these hours, please call the patient's attending physician.

## 2022-01-20 NOTE — Progress Notes (Signed)
Rounding Note    Patient Name: [name] Date of Encounter: 01/20/2022  [place] Cardiologist: Carlyle Dolly, MD   Subjective   NAEO.  Breathing more comfortably this AM.  Inpatient Medications    Scheduled Meds:  amiodarone  400 mg Oral BID   Followed by   Derrill Memo ON 01/23/2022] amiodarone  400 mg Oral Daily   Followed by   Derrill Memo ON 01/27/2022] amiodarone  200 mg Oral Daily   Chlorhexidine Gluconate Cloth  6 each Topical Q0600   escitalopram  10 mg Oral Daily   feeding supplement  1 Container Oral TID BM   metoprolol tartrate  12.5 mg Oral BID   sodium chloride flush  10-40 mL Intracatheter Q12H   sodium chloride flush  3 mL Intravenous Q12H   Continuous Infusions:  sodium chloride Stopped (01/16/22 1822)   sodium chloride Stopped (01/15/22 1255)   sodium chloride     aztreonam 1 g (01/20/22 0549)   heparin 1,850 Units/hr (01/20/22 0339)   metronidazole 500 mg (01/20/22 0338)   TPN ADULT (ION) 65 mL/hr at 01/20/22 0349   TPN ADULT (ION)     PRN Meds: sodium chloride, Place/Maintain arterial line **AND** sodium chloride, acetaminophen, albuterol, alteplase, guaiFENesin-dextromethorphan, lip balm, [DISCONTINUED] ondansetron **OR** ondansetron (ZOFRAN) IV, mouth rinse, oxyCODONE, sodium chloride, sodium chloride flush, sodium chloride flush   Vital Signs    Vitals:   01/20/22 0005 01/20/22 0405 01/20/22 0611 01/20/22 0825  BP: 113/60 105/66  105/84  Pulse:    65  Resp: 12 11    Temp: 98.2 F (36.8 C) 98 F (36.7 C)    TempSrc: Oral Oral    SpO2: 100% 100%    Weight:   107.4 kg   Height:        Intake/Output Summary (Last 24 hours) at 01/20/2022 0923 Last data filed at 01/20/2022 0349 Gross per 24 hour  Intake 2354.38 ml  Output 645 ml  Net 1709.38 ml       01/20/2022    6:11 AM 01/19/2022    5:00 AM 01/18/2022    2:46 PM  Last 3 Weights  Weight (lbs) 236 lb 12.4 oz 233 lb 4 oz 232 lb 5.8 oz  Weight (kg) 107.4 kg 105.8 kg 105.4 kg       Telemetry    AF. Occasional PVCs.  - Personally Reviewed  ECG    Personally Reviewed  Physical Exam   GEN: Obese.  Neck: unable to assess JVD due to body habitus Cardiac: irregularly irregular, no murmurs, rubs, or gallops.  Respiratory: Diffuse wheezing bilaterally. Expiratory. Crackles. GI: Soft, nontender, non-distended  MS: 1+ pitting bilateral LE edema to the thighs. 1+ pitting edema in the arms bilaterally.  Neuro:  Nonfocal  Psych: Normal affect   Labs    High Sensitivity Troponin:  No results for input(s): "TROPONINIHS" in the last 720 hours.   Chemistry Recent Labs  Lab 01/14/22 0400 01/14/22 1615 01/15/22 0431 01/16/22 0627 01/17/22 0115 01/18/22 0707 01/18/22 1145 01/19/22 0515 01/20/22 0343  NA 138 136 138 134*   < > 126* 130* 129* 129*  K 3.7 3.6 3.3* 3.3*   < > 3.1* 3.3* 3.6 3.9  CL 109 107 107 106   < > 102 100 96* 97*  CO2 20* '24 24 22   '$ < > 21* '25 24 25  '$ GLUCOSE 158* 125* 98 113*   < > 112* 145* 121* 110*  BUN 31* 23 28* 43*   < >  54* 39* 47* 60*  CREATININE 2.08* 1.64* 2.09* 2.75*   < > 2.57* 1.92* 2.19* 2.43*  CALCIUM 7.4* 7.3* 7.4* 7.7*   < > 7.1* 7.1* 7.4* 7.5*  MG 2.4  --  2.1 2.1   < > 1.8  --  1.8 2.0  PROT 4.5*  --  4.1* 4.1*  --   --   --   --   --   ALBUMIN 1.9* 1.8* 1.8* 1.8*  --   --   --   --   --   AST 20  --  34 25  --   --   --   --   --   ALT 16  --  20 17  --   --   --   --   --   ALKPHOS 84  --  155* 128*  --   --   --   --   --   BILITOT 0.7  --  0.7 0.6  --   --   --   --   --   GFRNONAA 24* 32* 24* 17*   < > 19* 27* 23* 20*  ANIONGAP '9 5 7 6   '$ < > 3* '5 9 7   '$ < > = values in this interval not displayed.     Lipids  Recent Labs  Lab 01/17/22 0536  TRIG 39     Hematology Recent Labs  Lab 01/18/22 0707 01/19/22 0730 01/20/22 0343  WBC 19.1* 17.2* 14.3*  RBC 2.78* 2.61* 2.58*  HGB 7.9* 7.4* 7.2*  HCT 24.7* 22.9* 23.1*  MCV 88.8 87.7 89.5  MCH 28.4 28.4 27.9  MCHC 32.0 32.3 31.2  RDW 18.7* 18.8* 18.9*   PLT 259 306 354    Thyroid No results for input(s): "TSH", "FREET4" in the last 168 hours.  BNPNo results for input(s): "BNP", "PROBNP" in the last 168 hours.  DDimer No results for input(s): "DDIMER" in the last 168 hours.   Radiology    DG CHEST PORT 1 VIEW  Result Date: 01/20/2022 CLINICAL DATA:  Pleural effusion EXAM: PORTABLE CHEST 1 VIEW COMPARISON:  January 18, 2022 FINDINGS: The right and left PICC lines are stable. No pneumothorax. Interstitial opacities remain in the lungs, mildly improved. Stable cardiomegaly. The hila and mediastinum are unchanged. There is a left-sided pleural effusion with underlying opacity. No other changes. IMPRESSION: 1. Cardiomegaly and mild edema.  The edema appears mildly improved. 2. Probable tiny right effusion. Moderate left effusion with underlying opacity, likely atelectasis. 3. No other change. Electronically Signed   By: Dorise Bullion III M.D.   On: 01/20/2022 08:26   DG CHEST PORT 1 VIEW  Result Date: 01/18/2022 CLINICAL DATA:  [id number]; status post thoracentesis EXAM: PORTABLE CHEST 1 VIEW COMPARISON:  Chest x-ray dated January 16, 2022 FINDINGS: Again seen is left transjugular central venous catheter with its tip at the upper-mid SVC region and the right IJ central venous catheter with its tip at the proximal right atrium, stable. Cardiomegaly. There has been interval right thoracentesis. No pneumothorax. Left pleural effusion. Pulmonary edema. Likely superimposed left basilar atelectasis. Moderate thoracic spondylosis. IMPRESSION: There has been interval right thoracentesis. No pneumothorax. Cardiomegaly, pulmonary edema and left pleural effusion. Electronically Signed   By: Frazier Richards M.D.   On: 01/18/2022 17:55      Assessment & Plan    76yo woman with AF and septic shock.  #Persistent AF #PVC's PO amiodarone taper prescribed Cont BB Heparin gtt Transition  to PO [place] prior to discharge. Plan for outpatient DCCV if does not spontaneously  convert prior to discharge.  #Anemia Management per primary team.  Cardiology will sign off. Please call with questions/concerns.  For questions or updates, please contact [place] Please consult [email or web address] for contact info under        Signed, Vickie Epley, MD  01/20/2022, 9:23 AM

## 2022-01-20 NOTE — Progress Notes (Signed)
Speech Language Pathology Treatment: Dysphagia  Patient Details Name: Merica Prell MRN: [id number] DOB: 02/18/1946 Today's Date: 01/20/2022 Time: 1415-1430 SLP Time Calculation (min) (ACUTE ONLY): 15 min  Assessment / Plan / Recommendation Clinical Impression  New order received and acknowledged for speech swallow evaluation. Patient already on caseload for dysphagia.  Patient seen by SLP for skilled treatment session focused on dysphagia goals. Her son was present in room as well. Patient was alert but as she recently had medication (via IV) for nausea-Zofran, she is lethargic and requires cues to maintain alertness. Per son and confirmed by patient, she has primarily been eating ice chips, fruit ices and water and even with that her PO intake is poor. She had reported nausea when taking PO medication this morning as well. SLP observed patient with PO intake of total of three small ice chips. She did exhibit mild, wet sounding non productive cough but no change in vitals and voice remained clear. In addition, SLP observed patient to be coughing during initial evaluation prior to any PO's given. SLP is considering benefit from objective swallow study to r/o aspiration however concern at this time for patient's ability to tolerate barium contrast given her nausea and in addition, transport would likely cause discomfort to patient. SLP plans to f/u next 1-2 dates and if she continues to exhibit questionable s/s aspiration/penetration, will proceed with an objective swallow study (FEES vs MBS)    HPI HPI: Patient is a 76 y.o. female with PMH: COPD, HLD, essential HTN, hypothyroidism, PE, osteoarthritis, depression, GERD. She presented initially to urgent care on 01/04/22 with acute onset abdominal pain, had CT which was unremarkable and she received IV fluids and antiemetics and was discharged hom. She presented to the hospital from home on 01/07/22 with vomiting and diarrhea since 01/04/22. In ED CT scan  showed partial SBO. Surgery consulted and NG ordered. 8/15, had worsening renal function; 8/17 she went into shock and required emergent Exploratory laparotomy performed in OR and patient remained on vent on pressors post op. She arrived at [facility] on 8/19 and CRRT started; 8/21 she was extubated, TNA started and CRRT discontinued.      SLP Plan  Continue with current plan of care      Recommendations for follow up therapy are one component of a multi-disciplinary discharge planning process, led by the attending physician.  Recommendations may be updated based on patient status, additional functional criteria and insurance authorization.    Recommendations  Diet recommendations: Thin liquid;Dysphagia 1 (puree) Medication Administration: Other (Comment) (as tolerated by patient) Supervision: Full supervision/cueing for compensatory strategies;Staff to assist with self feeding Compensations: Small sips/bites;Slow rate Postural Changes and/or Swallow Maneuvers: Seated upright 90 degrees                Oral Care Recommendations: Staff/trained caregiver to provide oral care;Oral care BID;Oral care before and after PO Follow Up Recommendations: Follow physician's recommendations for discharge plan and follow up therapies Assistance recommended at discharge: Frequent or constant Supervision/Assistance SLP Visit Diagnosis: Dysphagia, unspecified (R13.10) Plan: Continue with current plan of care           Sonia Baller, MA, CCC-SLP Speech Therapy

## 2022-01-20 NOTE — Progress Notes (Signed)
PHARMACY - TOTAL PARENTERAL NUTRITION CONSULT NOTE   Indication: Prolonged ileus  Patient Measurements: Height: 5\' 4"  (162.6 cm) Weight: 107.4 kg (236 lb 12.4 oz) IBW/kg (Calculated) : 54.7 TPN AdjBW (KG): 60.7 Body mass index is 40.64 kg/m.  Assessment: 76 yo female with septic shock, high-grade small bowel obstruction. Underwent surgery on 8/17 with ex lap and lysis of adhesions for SBO. Reported vomiting and diarrhea since 8/11 up until surgery on 8/17. Pharmacy consulted for TPN due to prolonged ileus.  Glucose / Insulin: no hx DM - CBGs < 180.  SSI/CBG checked D/C'ed 01/18/22. Electrolytes:  8/26 labs post HD - low Na/CL 129/96, K 3.6 post ~5 runs (goal >/= 4), Mag 1.8 post 1gm (goal >/= 2), others WNL  8/27 labs pre HD - low Na/CL (unchanged), K 3.9 post 3 runs and 20mEq/d increase in TPN, Mag 2 post 2gm and 5mEq/d increase in TPN, others WNL (Phos trending up)  Renal: off CRRT 8/21 >> iHD using 4K baths, next HD tentatively 8/28 Hepatic: AST / ALT / tbili / TG WNL, Alk Phos trending down to 128, albumin 1.8 Intake / Output; MIVF: UOP none documented, stool 1088mL GI Imaging:  8/24 CT abd: decr gas distention, anasarca throughout abd wall, distention of lower abdominal/pelvic small bowel loops  8/23: persistent SBO vs. post-op ileus, suspected ascites  GI Surgeries / Procedures:  8/17 with ex lap and LOA for SBO  Central access: triple lumen CVC placed 01/10/22 TPN start date: 01/14/22  Nutritional Goals:  RD Estimated Needs Total Energy Estimated Needs: 1700-1900 Total Protein Estimated Needs: 100-120 gm Total Fluid Estimated Needs: 1L + UOP  Current Nutrition:  TPN CLD Boost TIDBW - 1 charted given yesterday  Plan:  Continue concentrated TPN at goal rate of 65 ml/hr to provide 110g AA and 1811 kCal, meeting 100% of nutrition needs Electrolytes in TPN: increase Na to 156mEq/L, increase K 45mEq/L on 8/26 ( = 110mEq/d), Ca 39mEq/L, increase Mg 76mEq/L on 8/26, decrease Phos  further to 28mmol/L, change Cl:Ac to 1:2 Add standard MVI and trace elements to TPN - no chromium since on iHD Monitor TPN labs on Mon/Thurs, f/u AM labs to evaluate accumulation prior to HD F/u PO intake/diet advancement  Shamell Hittle D. Mina Marble, PharmD, BCPS, [place] 01/20/2022, 8:14 AM

## 2022-01-20 NOTE — Progress Notes (Addendum)
PROGRESS NOTE    [name]  GLO:[id number] DOB: 1946-01-28 DOA: 01/07/2022 PCP: Chevis Pretty, FNP   Brief Narrative: 76 year old with past medical history hyperlipidemia, hypertension, hypothyroidism, PE in the past, osteoarthritis, depression who presents from home with nausea vomiting and diarrhea since 8/11.  CT scan was obtained and was unremarkable.  She received IV fluids and antiemetic and was discharged home.  Her vomiting and diarrhea persisted, she presented to [facility], CT scan repeated found to have partial small bowel obstruction and significant changes compared to CT done on 8/11. Admitted 80/14 for bowel obstruction and was managed medically.  She developed acute renal failure, developed shock and underwent emergent exploratory laparotomy with lysis of adhesion on 8/17.  Intubated for Sx.  She was started on CRRT.  She was transferred to [facility] 8/19.  She was subsequently extubated 8/21st, started on TNA, CRRT discontinued.  She was started on hemodialysis first section 8/23. During hospitalization she also developed A-fib with RVR, started on amiodarone, cardiology consulted.  In hemodialysis 8/23  she was noted to be more lethargic, she was noted to have aberrantly conducted A-fib with PVCs, versus slow rate  V. Tach.  IV amiodarone was resumed.  Patient develops worsening abdominal pain, CT scan negative for abscess, no significant bowel obstruction. Surgery will resume clear liquid diet. She was notice to have BL pleural effusion moderate. CCM consulted for evaluation for Thoracentesis.    Assessment & Plan:   Principal Problem:   Septic shock ([place]) Active Problems:   Respiratory failure requiring intubation ([place])   Small bowel obstruction (HCC)   COPD (chronic obstructive pulmonary disease) (HCC)   Hyperlipidemia   Hypothyroidism (acquired)   Essential hypertension   History of pulmonary embolus (PE)   AKI (acute kidney injury) ([place])   Acute  respiratory failure with hypoxia and hypercapnia (HCC)   Pressure injury of skin   Malnutrition of moderate degree   1-SBO status post laparotomy 8/17 NG tube has been removed.  Patient started on TPN 8/21 CT abdomen and pelvis 8/24: , due to abdominal pain and leukocytosis. Negative for abscess, decrease bowel distension small bowel loop.  Treated course of aztreonam with metronidazole, abx d/ed on 8/27 per gen surg recommendation Rectal tube place per surgery./ Having diarrhea.  gi pcr panel in process,  Negative cdiff  2-Acute Metabolic Encephalopathy: She was more lethargic 8/23. ABG with hypoxia--- placed on oxygen CCM to evaluate, recommended support care.  Chest x ray: with pulmonary edema, PNA not excluded. IV antibiotics resume.  CT head. Negative.  Multifactorial secondary to delirium, acute illness.  Improved. She is more alert.   3-A-fib with RVR, PVC Versus slow rate  V. tach IV amiodarone  Replete Mg IV. Potassium corrected with HD>  On low dose metoprolol.  Appreciate cardiology assistance, ectopy during last HD related to electrolytes abnormalities.  If stable plan to transition to oral amiodarone.   4-Acute hypoxic respiratory failure; ? PNA Vs Pulmonary edema BL Pleural effusion; ABG Po2 58 Placed on oxygen.. Chest x ray : pulmonary edema vs infection.  Started IV Aztreonam and flagyl.  Volume manage with HD>  CT abdomen pelvis showed: Moderate to large bilateral pleural effusions. CCM consulted for evaluation of Thoracentesis.   AKI likely from ATN from sepsis Treated with CRRT and discontinue 80/21 Nephrology  following and initiating intermittent hemodialysis Tolerated better HB today, no significant ectopy.   5-Moderate protein malnutrition: Continue with TPN  Chronic anemia: Monitor hemoglobin  Hypokalemia, hypophosphatemia,: Replete.  Leukocytosis: Chest x ray PNA vs edema.  CT abdomen pelvis. Negative for abscess.  Check GI pathogen.   S/p Thoracentesis on 8/25 Wbc improving   8/27 addendum: c diff negative, gi pcr panel + STEC, discussed with ID Dr Candiss Norse who does not recommend treatment.   See wound care documentation below  Pressure Injury 01/10/22 Coccyx Medial;Mid Stage 2 -  Partial thickness loss of dermis presenting as a shallow open injury with a red, pink wound bed without slough. pink (Active)  01/10/22 1200  Location: Coccyx  Location Orientation: Medial;Mid  Staging: Stage 2 -  Partial thickness loss of dermis presenting as a shallow open injury with a red, pink wound bed without slough.  Wound Description (Comments): pink  Present on Admission:   Dressing Type None 01/19/22 2000     Pressure Injury 01/12/22 Face Left Stage 2 -  Partial thickness loss of dermis presenting as a shallow open injury with a red, pink wound bed without slough. red ulceration beneath ETT holder on L cheek (Active)  01/12/22 1815  Location: Face  Location Orientation: Left  Staging: Stage 2 -  Partial thickness loss of dermis presenting as a shallow open injury with a red, pink wound bed without slough.  Wound Description (Comments): red ulceration beneath ETT holder on L cheek  Present on Admission: Yes  Dressing Type None 01/19/22 2000     Pressure Injury 01/16/22 Lumbar Left (Active)  01/16/22 1230  Location: Lumbar  Location Orientation: Left  Staging:   Wound Description (Comments):   Present on Admission: Yes  Dressing Type None 01/19/22 2000     Pressure Injury 01/16/22 Buttocks Left (Active)  01/16/22 1230  Location: Buttocks  Location Orientation: Left  Staging:   Wound Description (Comments):   Present on Admission: Yes  Dressing Type None 01/19/22 2000     Nutrition Problem: Moderate Malnutrition Etiology: acute illness (SBO)    Signs/Symptoms: energy intake < 75% for > 7 days, mild muscle depletion, moderate fat depletion    Interventions: TPN  Estimated body mass index is 40.64 kg/m as  calculated from the following:   Height as of this encounter: '5\' 4"'$  (1.626 m).   Weight as of this encounter: 107.4 kg.   DVT prophylaxis: Heparin drip Code Status: DNR Family Communication: Son updated in person on  8/26 and 8/27 Disposition Plan:  Status is: Inpatient Remains inpatient appropriate because: Remain for management of SBO, status postsurgery, A-fib, renal failure    Consultants:  Nephrology CCM Surgery  Procedures:    Antimicrobials:    Subjective:  She appear is stronger today,  she is tolerating tpn,  remain edematous, on room air, denies sob Currently denies pain Rectal tube in place with liquid stool, C. difficile is negative, GI PCR panel pending, WBC appears start to trend down Son at bedside      Objective: Vitals:   01/20/22 0005 01/20/22 0405 01/20/22 0611 01/20/22 0825  BP: 113/60 105/66  105/84  Pulse:    65  Resp: 12 11    Temp: 98.2 F (36.8 C) 98 F (36.7 C)    TempSrc: Oral Oral    SpO2: 100% 100%    Weight:   107.4 kg   Height:        Intake/Output Summary (Last 24 hours) at 01/20/2022 0905 Last data filed at 01/20/2022 0349 Gross per 24 hour  Intake 2354.38 ml  Output 645 ml  Net 1709.38 ml   Filed Weights   01/18/22 1446  01/19/22 0500 01/20/22 0611  Weight: 105.4 kg 105.8 kg 107.4 kg    Examination:  General exam: appear stronger, generalized edema Respiratory system:  diminished at basis, no wheezing, no rales, normal respiratory effort  Cardiovascular system: S 1, S 2 RRR Gastrointestinal system: BS present, soft, nt, midline incision cover.  Central nervous system: She is alert, follows command Extremities: plus 2 edema.    Data Reviewed: I have personally reviewed following labs and imaging studies  CBC: Recent Labs  Lab 01/16/22 0627 01/17/22 0536 01/18/22 0707 01/19/22 0730 01/20/22 0343  WBC 19.5* 20.2* 19.1* 17.2* 14.3*  HGB 8.8* 8.3* 7.9* 7.4* 7.2*  HCT 28.2* 25.8* 24.7* 22.9* 23.1*  MCV 89.8  88.7 88.8 87.7 89.5  PLT 202 218 259 306 268   Basic Metabolic Panel: Recent Labs  Lab 01/17/22 0115 01/17/22 0536 01/17/22 1100 01/18/22 0707 01/18/22 1145 01/19/22 0515 01/20/22 0343  NA 132*  --  131* 126* 130* 129* 129*  K 2.6*  --  3.1* 3.1* 3.3* 3.6 3.9  CL 102  --  102 102 100 96* 97*  CO2 22  --  22 21* '25 24 25  '$ GLUCOSE 112*  --  120* 112* 145* 121* 110*  BUN 34*  --  41* 54* 39* 47* 60*  CREATININE 2.08*  --  2.33* 2.57* 1.92* 2.19* 2.43*  CALCIUM 7.1*  --  7.3* 7.1* 7.1* 7.4* 7.5*  MG 1.5*  --  1.9 1.8  --  1.8 2.0  PHOS 2.6 2.5  --  3.2  --  3.0 3.8   GFR: Estimated Creatinine Clearance: 23.9 mL/min (A) (by C-G formula based on SCr of 2.43 mg/dL (H)). Liver Function Tests: Recent Labs  Lab 01/13/22 1521 01/14/22 0400 01/14/22 1615 01/15/22 0431 01/16/22 0627  AST  --  20  --  34 25  ALT  --  16  --  20 17  ALKPHOS  --  84  --  155* 128*  BILITOT  --  0.7  --  0.7 0.6  PROT  --  4.5*  --  4.1* 4.1*  ALBUMIN 1.9* 1.9* 1.8* 1.8* 1.8*   No results for input(s): "LIPASE", "AMYLASE" in the last 168 hours. No results for input(s): "AMMONIA" in the last 168 hours. Coagulation Profile: No results for input(s): "INR", "PROTIME" in the last 168 hours. Cardiac Enzymes: No results for input(s): "CKTOTAL", "CKMB", "CKMBINDEX", "TROPONINI" in the last 168 hours. BNP (last 3 results) No results for input(s): "PROBNP" in the last 8760 hours. HbA1C: No results for input(s): "HGBA1C" in the last 72 hours. CBG: Recent Labs  Lab 01/19/22 0811 01/19/22 1211 01/19/22 1557 01/20/22 0028 01/20/22 0548  GLUCAP 112* 103* 119* 107* 100*   Lipid Profile: No results for input(s): "CHOL", "HDL", "LDLCALC", "TRIG", "CHOLHDL", "LDLDIRECT" in the last 72 hours.  Thyroid Function Tests: No results for input(s): "TSH", "T4TOTAL", "FREET4", "T3FREE", "THYROIDAB" in the last 72 hours. Anemia Panel: No results for input(s): "VITAMINB12", "FOLATE", "FERRITIN", "TIBC", "IRON",  "RETICCTPCT" in the last 72 hours. Sepsis Labs: No results for input(s): "PROCALCITON", "LATICACIDVEN" in the last 168 hours.   Recent Results (from the past 240 hour(s))  Culture, blood (Routine X 2) w Reflex to ID Panel     Status: None   Collection Time: 01/10/22 12:55 PM   Specimen: BLOOD  Result Value Ref Range Status   Specimen Description BLOOD LEFT ANTECUBITAL  Final   Special Requests   Final    BOTTLES DRAWN AEROBIC AND ANAEROBIC  Blood Culture results may not be optimal due to an excessive volume of blood received in culture bottles   Culture   Final    NO GROWTH 5 DAYS Performed at [facility], [address], [place], [place] [address]    Report Status 01/15/2022 FINAL  Final  Culture, blood (Routine X 2) w Reflex to ID Panel     Status: None   Collection Time: 01/10/22 12:55 PM   Specimen: BLOOD  Result Value Ref Range Status   Specimen Description BLOOD BLOOD RIGHT HAND  Final   Special Requests   Final    BOTTLES DRAWN AEROBIC ONLY Blood Culture adequate volume   Culture   Final    NO GROWTH 5 DAYS Performed at [facility], [address] [place], [place] [address]    Report Status 01/15/2022 FINAL  Final  MRSA Next Gen by PCR, Nasal     Status: None   Collection Time: 01/10/22  1:11 PM   Specimen: Nasal Mucosa; Nasal Swab  Result Value Ref Range Status   MRSA by PCR Next Gen NOT DETECTED NOT DETECTED Final    Comment: (NOTE) The GeneXpert MRSA Assay (FDA approved for NASAL specimens only), is one component of a comprehensive MRSA colonization surveillance program. It is not intended to diagnose MRSA infection nor to guide or monitor treatment for MRSA infections. Test performance is not FDA approved in patients less than 57 years old. Performed at [facility], [address] [place], [place] [address]   Culture, Respiratory w Gram Stain     Status: None   Collection Time: 01/12/22  6:14 PM   Specimen: Tracheal Aspirate; Respiratory  Result Value Ref  Range Status   Specimen Description TRACHEAL ASPIRATE  Final   Special Requests NONE  Final   Gram Stain NO WBC SEEN NO ORGANISMS SEEN   Final   Culture   Final    RARE Normal respiratory flora-no Staph aureus or Pseudomonas seen Performed at [facility], [address] [place], [place] [address]    Report Status 01/14/2022 FINAL  Final  MRSA Next Gen by PCR, Nasal     Status: None   Collection Time: 01/12/22  6:50 PM   Specimen: Nasal Mucosa; Nasal Swab  Result Value Ref Range Status   MRSA by PCR Next Gen NOT DETECTED NOT DETECTED Final    Comment: (NOTE) The GeneXpert MRSA Assay (FDA approved for NASAL specimens only), is one component of a comprehensive MRSA colonization surveillance program. It is not intended to diagnose MRSA infection nor to guide or monitor treatment for MRSA infections. Test performance is not FDA approved in patients less than 75 years old. Performed at [facility], [place] [address], [place], [place] [address]   C Difficile Quick Screen (NO PCR Reflex)     Status: None   Collection Time: 01/19/22  6:32 PM   Specimen: STOOL  Result Value Ref Range Status   C Diff antigen NEGATIVE NEGATIVE Final   C Diff toxin NEGATIVE NEGATIVE Final   C Diff interpretation No C. difficile detected.  Final    Comment: Performed at [facility], [place] [address] [place],  [address]         Radiology Studies: DG CHEST PORT 1 VIEW  Result Date: 01/20/2022 CLINICAL DATA:  Pleural effusion EXAM: PORTABLE CHEST 1 VIEW COMPARISON:  January 18, 2022 FINDINGS: The right and left PICC lines are stable. No pneumothorax. Interstitial opacities remain in  the lungs, mildly improved. Stable cardiomegaly. The hila and mediastinum are unchanged. There is a left-sided pleural effusion with underlying opacity. No other changes. IMPRESSION: 1. Cardiomegaly and mild edema.  The edema appears mildly improved. 2. Probable tiny right effusion. Moderate left effusion  with underlying opacity, likely atelectasis. 3. No other change. Electronically Signed   By: Dorise Bullion III M.D.   On: 01/20/2022 08:26   DG CHEST PORT 1 VIEW  Result Date: 01/18/2022 CLINICAL DATA:  [id number]; status post thoracentesis EXAM: PORTABLE CHEST 1 VIEW COMPARISON:  Chest x-ray dated January 16, 2022 FINDINGS: Again seen is left transjugular central venous catheter with its tip at the upper-mid SVC region and the right IJ central venous catheter with its tip at the proximal right atrium, stable. Cardiomegaly. There has been interval right thoracentesis. No pneumothorax. Left pleural effusion. Pulmonary edema. Likely superimposed left basilar atelectasis. Moderate thoracic spondylosis. IMPRESSION: There has been interval right thoracentesis. No pneumothorax. Cardiomegaly, pulmonary edema and left pleural effusion. Electronically Signed   By: Frazier Richards M.D.   On: 01/18/2022 17:55        Scheduled Meds:  amiodarone  400 mg Oral BID   Followed by   Derrill Memo ON 01/23/2022] amiodarone  400 mg Oral Daily   Followed by   Derrill Memo ON 01/27/2022] amiodarone  200 mg Oral Daily   Chlorhexidine Gluconate Cloth  6 each Topical Q0600   escitalopram  10 mg Oral Daily   feeding supplement  1 Container Oral TID BM   metoprolol tartrate  12.5 mg Oral BID   sodium chloride flush  10-40 mL Intracatheter Q12H   sodium chloride flush  3 mL Intravenous Q12H   Continuous Infusions:  sodium chloride Stopped (01/16/22 1822)   sodium chloride Stopped (01/15/22 1255)   sodium chloride     aztreonam 1 g (01/20/22 0549)   heparin 1,850 Units/hr (01/20/22 0339)   metronidazole 500 mg (01/20/22 0338)   TPN ADULT (ION) 65 mL/hr at 01/20/22 0349   TPN ADULT (ION)       LOS: 13 days        Florencia Reasons, MD PhD FACP [facility]   If 7PM-7AM, please contact night-coverage [email or web address]  01/20/2022, 9:05 AM

## 2022-01-20 NOTE — Progress Notes (Signed)
NAME:  [name], MRN:  [id number], DOB:  1945-06-08, LOS: 75 ADMISSION DATE:  01/07/2022, CONSULTATION DATE:  8/17 REFERRING MD:  Leafy Half, [facility], CHIEF COMPLAINT:  post op circulatory shock/ resp failure    History of Present Illness:  13 yowf never smoker with "COPD" (not supported by pfts)   hyperlipidemia, essential hypertension, hypothyroidism, pulmonary embolism in the past, osteoarthritis, and depression who presents from home with vomiting and diarrhea since 8/11 when  presented with  acute onset of abdominal pain CT scan was obtained and was unremarkable she received some IV fluids and antiemetics and was discharged home.  Her V and D persisted and when  previous treatment did not alleviate her symptoms   she presented to [facility].  CT scan was obtained and found to show a partial small bowel obstruction and significant changes compared to the CT scan she had on 8/11.   ED Course: Patient was given an oral dose of Zofran and some IV fluid drip.  She continued to have persistent nausea in the emergency department and vomited at least 200 mL  witnessed  She was given additional Zofran at that point in her IV.  Also had 1 episode of diarrhea in the emergency department which is described as small vol. Labs were consistent with acute kidney injury and hypokalemia CT scan showed a partial small bowel obstruction.     To OR 8/17 Exploratory laparotomy lysis of adhesion and remained on vent/ on pressors post op and PCCM service consulted.   Worsening renal failure despite volume resuscitation and ongoing pressors and MV requirements. Transferred to [facility] for CRRT.     Significant Hospital Events: Including procedures, antibiotic start and stop dates in addition to other pertinent events   8/14 admitted; bowel obstruction managed medically 8/15 renal function worsening 8/17  Went into shock> emergent exploratory laparotomy w/ lysis of adhesion, intubated, RIJ CVC placed in OR 8/18 worsening renal  function 8/19 arrived at [facility], starting CRRT 8/21 extubated, TNA started, CRRT discontinued 8/22 transferred to floor. 8/25 Pulmonary critical care called back for pleural effusions no need for thoracentesis. Right thoracentesis preformed with 1165m of straw colored fluid was removed   Interim History / Subjective:  Continues to report significant swelling but denies any acute respiratory complaints   Objective   Blood pressure 105/66, pulse 75, temperature 98 F (36.7 C), temperature source Oral, resp. rate 11, height '5\' 4"'$  (1.626 m), weight 107.4 kg, SpO2 100 %.        Intake/Output Summary (Last 24 hours) at 01/20/2022 0718 Last data filed at 01/20/2022 0349 Gross per 24 hour  Intake 2354.38 ml  Output 645 ml  Net 1709.38 ml    Filed Weights   01/18/22 1446 01/19/22 0500 01/20/22 01791 Weight: 105.4 kg 105.8 kg 107.4 kg        Examination: General: Acute on chronically ill appearing elderly female, lying in bed in NAD HEENT: [place]/AT, MM pink/moist, PERRL,  Neuro: Alert and oriented x3, non-focal  CV: s1s2 regular rate and rhythm, no murmur, rubs, or gallops,  PULM:  Slight diminished with faint rhonchi to left, no increased work of breathing, on [place] GI: soft, bowel sounds active in all 4 quadrants, non-tender, non-distended, tolerating oral diet Extremities: warm/dry, 2-3+ pitting edema  Skin: no rashes or lesions  Assessment & Plan:  Bilateral pleural effusions on CT of the abdomen on 01/17/2022  End stage renal disease on HD -On admission she is massively volume overloaded with 3-4+ edema/anasarca  lower extremities and arms -Right thoracentesis preformed with 1158m of straw colored fluid was removed  P: No acute respiratory indications for left thoracentesis  Will reassess tomorrow morning for fluids removal  Continue HD for volume removal   PCCM will see again 8/28  Best Practice (right click and "Reselect all SmartList Selections" daily)  Per primary    Signature:  Eaton Folmar D. HKenton Kingfisher NP-C [facility] Personal contact information can be found on Amion  01/20/2022, 7:20 AM

## 2022-01-21 ENCOUNTER — Encounter (HOSPITAL_COMMUNITY): Payer: Self-pay | Admitting: Internal Medicine

## 2022-01-21 DIAGNOSIS — A419 Sepsis, unspecified organism: Secondary | ICD-10-CM | POA: Diagnosis not present

## 2022-01-21 DIAGNOSIS — R6521 Severe sepsis with septic shock: Secondary | ICD-10-CM | POA: Diagnosis not present

## 2022-01-21 LAB — PHOSPHORUS: Phosphorus: 4.4 mg/dL (ref 2.5–4.6)

## 2022-01-21 LAB — CBC
HCT: 22.6 % — ABNORMAL LOW (ref 36.0–46.0)
Hemoglobin: 7.3 g/dL — ABNORMAL LOW (ref 12.0–15.0)
MCH: 28.3 pg (ref 26.0–34.0)
MCHC: 32.3 g/dL (ref 30.0–36.0)
MCV: 87.6 fL (ref 80.0–100.0)
Platelets: 417 10*3/uL — ABNORMAL HIGH (ref 150–400)
RBC: 2.58 MIL/uL — ABNORMAL LOW (ref 3.87–5.11)
RDW: 18.8 % — ABNORMAL HIGH (ref 11.5–15.5)
WBC: 14.3 10*3/uL — ABNORMAL HIGH (ref 4.0–10.5)
nRBC: 0 % (ref 0.0–0.2)

## 2022-01-21 LAB — COMPREHENSIVE METABOLIC PANEL
ALT: 16 U/L (ref 0–44)
AST: 16 U/L (ref 15–41)
Albumin: 1.7 g/dL — ABNORMAL LOW (ref 3.5–5.0)
Alkaline Phosphatase: 93 U/L (ref 38–126)
Anion gap: 9 (ref 5–15)
BUN: 77 mg/dL — ABNORMAL HIGH (ref 8–23)
CO2: 26 mmol/L (ref 22–32)
Calcium: 7.7 mg/dL — ABNORMAL LOW (ref 8.9–10.3)
Chloride: 96 mmol/L — ABNORMAL LOW (ref 98–111)
Creatinine, Ser: 2.73 mg/dL — ABNORMAL HIGH (ref 0.44–1.00)
GFR, Estimated: 18 mL/min — ABNORMAL LOW (ref >60)
Glucose, Bld: 106 mg/dL — ABNORMAL HIGH (ref 70–99)
Potassium: 3.7 mmol/L (ref 3.5–5.1)
Sodium: 131 mmol/L — ABNORMAL LOW (ref 135–145)
Total Bilirubin: 0.3 mg/dL (ref 0.3–1.2)
Total Protein: 4.7 g/dL — ABNORMAL LOW (ref 6.5–8.1)

## 2022-01-21 LAB — MAGNESIUM: Magnesium: 2.1 mg/dL (ref 1.7–2.4)

## 2022-01-21 LAB — GLUCOSE, CAPILLARY
Glucose-Capillary: 126 mg/dL — ABNORMAL HIGH (ref 70–99)
Glucose-Capillary: 112 mg/dL — ABNORMAL HIGH (ref 70–99)

## 2022-01-21 LAB — HEPARIN LEVEL (UNFRACTIONATED): Heparin Unfractionated: 0.65 IU/mL (ref 0.30–0.70)

## 2022-01-21 LAB — TRIGLYCERIDES: Triglycerides: 18 mg/dL (ref <150)

## 2022-01-21 MED ORDER — TRACE MINERALS CU-MN-SE-ZN 300-55-60-3000 MCG/ML IV SOLN
INTRAVENOUS | Status: AC
  Filled 2022-01-21: qty 738.4

## 2022-01-21 MED ORDER — HEPARIN SODIUM (PORCINE) 1000 UNIT/ML IJ SOLN
INTRAMUSCULAR | Status: AC
  Administered 2022-01-21: 1000 [IU]
  Filled 2022-01-21: qty 3

## 2022-01-21 MED FILL — Ketamine HCl Inj 10 MG/ML: INTRAMUSCULAR | Qty: 18 | Status: AC

## 2022-01-21 NOTE — Progress Notes (Signed)
3 Days Post-Op   Subjective/Chief Complaint: No further nausea.  Says something about feeling like she gets choked sometimes.  Seen in HD.  Looks better today than last week   Objective: Vital signs in last 24 hours: Temp:  [97.9 F (36.6 C)-98.8 F (37.1 C)] 98 F (36.7 C) (08/28 0815) Pulse Rate:  [60-70] 67 (08/28 0930) Resp:  [12-23] 14 (08/28 0930) BP: (101-124)/(50-85) 121/74 (08/28 0930) SpO2:  [98 %-100 %] 100 % (08/28 0930) Weight:  [110 kg] 110 kg (08/28 0656) Last BM Date :  (flexiseal in place)  Intake/Output from previous day: 08/27 0701 - 08/28 0700 In: 2261.6 [P.O.:120; I.V.:1941.6; IV Piggyback:200] Out: 545 [Urine:250; Stool:295] Intake/Output this shift: Total I/O In: -  Out: 350 [Urine:350]  PE: Lungs: normal effort on [place] Abd: soft, ND, honeycomb dressing removed today, incision c/d/I; overall minimally tender on exam  Extremities: BUE and BLE edema  Lab Results:  Recent Labs    01/20/22 0343 01/21/22 0645  WBC 14.3* 14.3*  HGB 7.2* 7.3*  HCT 23.1* 22.6*  PLT 354 417*   BMET Recent Labs    01/20/22 0343 01/21/22 0645  NA 129* 131*  K 3.9 3.7  CL 97* 96*  CO2 25 26  GLUCOSE 110* 106*  BUN 60* 77*  CREATININE 2.43* 2.73*  CALCIUM 7.5* 7.7*   PT/INR No results for input(s): "LABPROT", "INR" in the last 72 hours. ABG No results for input(s): "PHART", "HCO3" in the last 72 hours.  Invalid input(s): "PCO2", "PO2"   Studies/Results: DG CHEST PORT 1 VIEW  Result Date: 01/20/2022 CLINICAL DATA:  Pleural effusion EXAM: PORTABLE CHEST 1 VIEW COMPARISON:  January 18, 2022 FINDINGS: The right and left PICC lines are stable. No pneumothorax. Interstitial opacities remain in the lungs, mildly improved. Stable cardiomegaly. The hila and mediastinum are unchanged. There is a left-sided pleural effusion with underlying opacity. No other changes. IMPRESSION: 1. Cardiomegaly and mild edema.  The edema appears mildly improved. 2. Probable tiny right  effusion. Moderate left effusion with underlying opacity, likely atelectasis. 3. No other change. Electronically Signed   By: Dorise Bullion III M.D.   On: 01/20/2022 08:26    Anti-infectives: Anti-infectives (From admission, onward)    Start     Dose/Rate Route Frequency Ordered Stop   01/16/22 2000  aztreonam (AZACTAM) 1 g in sodium chloride 0.9 % 100 mL IVPB  Status:  Discontinued        1 g 200 mL/hr over 30 Minutes Intravenous Every 8 hours 01/16/22 1840 01/20/22 1254   01/16/22 2000  metroNIDAZOLE (FLAGYL) IVPB 500 mg  Status:  Discontinued        500 mg 100 mL/hr over 60 Minutes Intravenous Every 8 hours 01/16/22 1840 01/20/22 1254   01/15/22 0526  aztreonam (AZACTAM) 1 g in sodium chloride 0.9 % 100 mL IVPB  Status:  Discontinued       See Hyperspace for full Linked Orders Report.   1 g 200 mL/hr over 30 Minutes Intravenous Every 24 hours 01/14/22 1529 01/14/22 1534   01/15/22 0526  aztreonam (AZACTAM) 1 g in sodium chloride 0.9 % 100 mL IVPB       See Hyperspace for full Linked Orders Report.   1 g 200 mL/hr over 30 Minutes Intravenous Every 24 hours 01/14/22 1534 01/15/22 0504   01/13/22 2100  vancomycin (VANCOCIN) IVPB 1000 mg/200 mL premix  Status:  Discontinued        1,000 mg 200 mL/hr over 60 Minutes Intravenous  Every 24 hours 01/12/22 2135 01/13/22 1027   01/13/22 0500  aztreonam (AZACTAM) 2 g in sodium chloride 0.9 % 100 mL IVPB  Status:  Discontinued       See Hyperspace for full Linked Orders Report.   2 g 200 mL/hr over 30 Minutes Intravenous Every 12 hours 01/12/22 2135 01/14/22 1529   01/12/22 2015  vancomycin (VANCOREADY) IVPB 1750 mg/350 mL        1,750 mg 175 mL/hr over 120 Minutes Intravenous  Once 01/12/22 1921 01/12/22 2259   01/11/22 1800  aztreonam (AZACTAM) 1 g in sodium chloride 0.9 % 100 mL IVPB  Status:  Discontinued       See Hyperspace for full Linked Orders Report.   1 g 200 mL/hr over 30 Minutes Intravenous Every 8 hours 01/11/22 0815 01/12/22  2135   01/11/22 1000  levofloxacin (LEVAQUIN) IVPB 500 mg  Status:  Discontinued        500 mg 100 mL/hr over 60 Minutes Intravenous Every 48 hours 01/10/22 1432 01/11/22 0744   01/11/22 0915  aztreonam (AZACTAM) 2 g in sodium chloride 0.9 % 100 mL IVPB       See Hyperspace for full Linked Orders Report.   2 g 200 mL/hr over 30 Minutes Intravenous  Once 01/11/22 0815 01/11/22 1045   01/11/22 0845  aztreonam (AZACTAM) 2 g in sodium chloride 0.9 % 100 mL IVPB  Status:  Discontinued        2 g 200 mL/hr over 30 Minutes Intravenous  Once 01/11/22 0747 01/11/22 0815   01/11/22 0845  metroNIDAZOLE (FLAGYL) IVPB 500 mg  Status:  Discontinued        500 mg 100 mL/hr over 60 Minutes Intravenous Every 12 hours 01/11/22 0747 01/15/22 0851   01/10/22 1900  levofloxacin (LEVAQUIN) IVPB 500 mg  Status:  Discontinued        500 mg 100 mL/hr over 60 Minutes Intravenous Every 24 hours 01/10/22 1426 01/10/22 1432   01/10/22 0700  ciprofloxacin (CIPRO) IVPB 400 mg        400 mg 200 mL/hr over 60 Minutes Intravenous On call to O.R. 01/10/22 0612 01/10/22 1052   01/10/22 0700  metroNIDAZOLE (FLAGYL) IVPB 500 mg  Status:  Discontinued        500 mg 100 mL/hr over 60 Minutes Intravenous On call to O.R. 01/10/22 0612 01/10/22 1123   01/08/22 0830  Levofloxacin (LEVAQUIN) IVPB 250 mg  Status:  Discontinued        250 mg 50 mL/hr over 60 Minutes Intravenous Every 24 hours 01/08/22 0724 01/08/22 0727   01/08/22 0830  Levofloxacin (LEVAQUIN) IVPB 250 mg  Status:  Discontinued        250 mg 50 mL/hr over 60 Minutes Intravenous Every 48 hours 01/08/22 0727 01/08/22 0732   01/08/22 0830  Levofloxacin (LEVAQUIN) IVPB 250 mg  Status:  Discontinued        250 mg 50 mL/hr over 60 Minutes Intravenous Every 24 hours 01/08/22 0732 01/10/22 1426       Assessment/Plan: POD#11 - status post ex lap with lysis of adhesions - Dr. Constance Haw ([facility]) - Afebrile WBC down to 14.  CT scan negative for intra-abdominal infection.    -DC flexi-seal today as diarrhea seems to be slowing down some -c diff neg -continue full support TPN and await swallow evals.  Right now on D1 diet.  May need MBS, FEES  FEN: D1 diet, TPN, replacement of electrolytes per renal and primary; nephro shakes  ID: aztreonam 8/18 >>8/22, 8/23 restarted and flagyl, no abx needed from surgery standpoint VTE: hep gtt Foley: purewick in place     Afib - IV hep, rate control meds per cards AKI - CRRT stopped 8/21, now with IHD ABL anemia - stable Volume overload/pleural effusions - per medicine   Data reviewed - vitals x 24 hrs, nursing note x 24hrs, CCM note, renal note, hospitalist note, and cardiology note, labs, imaging, I/Os   LOS: 14 days    Henreitta Cea PA-C 01/21/2022

## 2022-01-21 NOTE — Progress Notes (Signed)
Physical Therapy Treatment Patient Details Name: [name] MRN: [id number] DOB: 1946-01-14 Today's Date: 01/21/2022   History of Present Illness 76 yo female admitted 8/14 to [facility] with vomiting and diarrhea with partial SBO. 8/17 intubated and ex lap. 8/19 pt with worsening renal function, transfer to [facility] and CRRT started. 8/21 extubation with CRRT off. PMHx: COPD, HLD, HTN, PE, hypothyroidism, OA and depression    PT Comments    Patient seen after dialysis with daughter present.  She gave brief history of pt's lymphedema treatments using bilateral pumps at home and hour a day for the past year.  She states this bout of illness (with kidney failure) has increased level of edema.  She reports having ordered stockings for her.  However, plan to perform wrapping on L UE prior to stocking fitting.  Also initiated education with daughter on manual lymphatic drainage and will continue.  Patient able to sit up EOB for exercises and performed sit to stand x 2 at bedside prior to fatigue.  PT will continue and follow up for wrapping with L UE.  Recommendations for follow up therapy are one component of a multi-disciplinary discharge planning process, led by the attending physician.  Recommendations may be updated based on patient status, additional functional criteria and insurance authorization.  Follow Up Recommendations  Skilled nursing-short term rehab (<3 hours/day) Can patient physically be transported by private vehicle: No   Assistance Recommended at Discharge Frequent or constant Supervision/Assistance  Patient can return home with the following Two people to help with walking and/or transfers;Direct supervision/assist for medications management;Two people to help with bathing/dressing/bathroom;Assist for transportation;Direct supervision/assist for financial management   Equipment Recommendations  Other (comment);Hospital bed    Recommendations for Other Services       Precautions /  Restrictions Precautions Precautions: Fall Precaution Comments: flexiseal, enteric precautions     Mobility  Bed Mobility Overal bed mobility: Needs Assistance Bed Mobility: Supine to Sit, Sit to Supine     Supine to sit: Max assist, HOB elevated, +2 for safety/equipment Sit to supine: +2 for physical assistance, Max assist   General bed mobility comments: assist for legs off bed and to lift trunk and scoot hips, daughter present and assisting a little from the back for safety; to supine NT assisting trunk and PT with legs into bed and to scoot up to [facility]    Transfers Overall transfer level: Needs assistance Equipment used:  (pulled up on back of recliner) Transfers: Sit to/from Stand Sit to Stand: From elevated surface, Max assist, Mod assist           General transfer comment: lifting help more for first attempt, less assist for second attempt to stand and pt pulling up on handles on back of recliner    Ambulation/Gait               General Gait Details: unable, though attempted side steps toward [facility]   Stairs             Wheelchair Mobility    Modified Rankin (Stroke Patients Only)       Balance Overall balance assessment: Needs assistance Sitting-balance support: Feet supported Sitting balance-Leahy Scale: Poor Sitting balance - Comments: reliant on UE support of bed or railing   Standing balance support: Bilateral upper extremity supported Standing balance-Leahy Scale: Poor Standing balance comment: stood holding back of recliner for about 45 seconds with cues for upright posture, looking out window, etc  Cognition Arousal/Alertness: Lethargic Behavior During Therapy: WFL for tasks assessed/performed Overall Cognitive Status: Impaired/Different from baseline Area of Impairment: Orientation, Memory, Attention, Following commands, Safety/judgement, Problem solving                 Orientation Level:  Disoriented to, Place, Time Current Attention Level: Sustained Memory: Decreased short-term memory Following Commands: Follows one step commands consistently, Follows one step commands with increased time Safety/Judgement: Decreased awareness of deficits   Problem Solving: Slow processing, Decreased initiation, Requires verbal cues General Comments: slow to arouse and needing increased time to attempt to answer orientation questions.        Exercises General Exercises - Lower Extremity Ankle Circles/Pumps: AROM, 10 reps, Supine, Both Long Arc Quad: AROM, Both, 10 reps, Seated Heel Slides: AAROM, Both, 5 reps, Supine Other Exercises Other Exercises: L shoulder AAROM rows x 10    General Comments General comments (skin integrity, edema, etc.): VSS on O2, daughter in the room and assisting.  Discussed lymphedema history and pt using leg pumps at home an hour each day.  Daughter reports has ordered stockings for her legs and arms, but discussed may need wrapping on L UE first.  Assisted to apply leg pumps after pt back in bed      Pertinent Vitals/Pain Pain Assessment Faces Pain Scale: Hurts little more Pain Location: L shoulder with ROM Pain Descriptors / Indicators: Aching, Grimacing, Discomfort Pain Intervention(s): Monitored during session    Home Living                          Prior Function            PT Goals (current goals can now be found in the care plan section) Progress towards PT goals: Progressing toward goals    Frequency    Min 3X/week      PT Plan Current plan remains appropriate    Co-evaluation              AM-PAC PT "6 Clicks" Mobility   Outcome Measure  Help needed turning from your back to your side while in a flat bed without using bedrails?: A Lot Help needed moving from lying on your back to sitting on the side of a flat bed without using bedrails?: A Lot Help needed moving to and from a bed to a chair (including a  wheelchair)?: Total Help needed standing up from a chair using your arms (e.g., wheelchair or bedside chair)?: A Lot Help needed to walk in hospital room?: Total Help needed climbing 3-5 steps with a railing? : Total 6 Click Score: 9    End of Session Equipment Utilized During Treatment: Gait belt;Oxygen Activity Tolerance: Patient limited by fatigue Patient left: in bed;with call bell/phone within reach;with family/visitor present   PT Visit Diagnosis: Other abnormalities of gait and mobility (R26.89);Difficulty in walking, not elsewhere classified (R26.2);Muscle weakness (generalized) (M62.81)     Time: 6440-3474 PT Time Calculation (min) (ACUTE ONLY): 51 min  Charges:  $Therapeutic Exercise: 8-22 mins $Therapeutic Activity: 23-37 mins                     Magda Kiel, PT Acute Rehabilitation Services Office:[phone] 01/21/2022    [name] 01/21/2022, 6:38 PM

## 2022-01-21 NOTE — Progress Notes (Signed)
OT Cancellation Note  Patient Details Name: [name] MRN: [id number] DOB: Jun 06, 1945   Cancelled Treatment:    Reason Eval/Treat Not Completed: Patient at procedure or test/ unavailable (Pt currently receiving HD and unable to participate in OT evaluation. Will re-attempt OT treat when patient is available.))  Ailene Ravel, OTR/L,CBIS  Supplemental OT - [facility] and [facility]  01/21/2022, 8:39 AM

## 2022-01-21 NOTE — Progress Notes (Signed)
PT Cancellation Note  Patient Details Name: [name] MRN: [id number] DOB: 1945-12-04   Cancelled Treatment:    Reason Eval/Treat Not Completed: Patient at procedure or test/unavailable; patient in hemodialysis.  Will attempt later as schedule permits.    [name] 01/21/2022, 10:33 AM Magda Kiel, PT Acute Rehabilitation Services Office:[phone] 01/21/2022

## 2022-01-21 NOTE — Progress Notes (Signed)
   NAME:  Shanece Saunders, MRN:  [id number], DOB:  11/01/1945, LOS: 59 ADMISSION DATE:  01/07/2022, CONSULTATION DATE:  8/17 REFERRING MD:  Leafy Half, [facility], CHIEF COMPLAINT:  post op circulatory shock/ resp failure    History of Present Illness:  76 y/o female with multiple medical problems admitted with SBO, developed worsening renal failure and shock and required emergent ex-lap at [facility], sent to [facility] for CRRT and ventilator management.  Extubated 8/21, CRRT discontinued.  PCCM reconsulted on 8/25 for pleural effusions, had 1.1L removed from R pleural space on 8/25.    Significant Hospital Events: Including procedures, antibiotic start and stop dates in addition to other pertinent events   8/14 admitted; bowel obstruction managed medically 8/15 renal function worsening 8/17  Went into shock> emergent exploratory laparotomy w/ lysis of adhesion, intubated, RIJ CVC placed in OR 8/18 worsening renal function 8/19 arrived at [facility], starting CRRT 8/21 extubated, TNA started, CRRT discontinued 8/22 transferred to floor. 8/25 Pulmonary critical care called back for pleural effusions no need for thoracentesis. Right thoracentesis preformed with 1136m of straw colored fluid was removed   Interim History / Subjective:   Feels about the same Undergoing dialysis  Objective   Blood pressure 122/75, pulse 69, temperature 98 F (36.7 C), temperature source Oral, resp. rate 12, height '5\' 4"'$  (1.626 m), weight 110 kg, SpO2 100 %.        Intake/Output Summary (Last 24 hours) at 01/21/2022 0902 Last data filed at 01/21/2022 0800 Gross per 24 hour  Intake 2261.58 ml  Output 895 ml  Net 1366.58 ml   Filed Weights   01/19/22 0500 01/20/22 0611 01/21/22 0656  Weight: 105.8 kg 107.4 kg 110 kg        Examination:  General:  Resting comfortably in bed receiving HD HENT: NCAT OP clear PULM: CTA B, normal effort CV: RRR, no mgr GI: BS+, soft, nontender MSK: normal bulk and tone Neuro: awake, alert, no  distress, MAEW   Assessment & Plan:  Bilateral pleural effusions on CT of the abdomen on 01/17/2022  End stage renal disease on HD -On admission she is massively volume overloaded with 3-4+ edema/anasarca lower extremities and arms -Right thoracentesis preformed with 11036mof straw colored fluid was removed on 8/25 P: Right now there is no acute indication for thoracentesis Will assess tomorrow after CXR is performed, if symptoms or imaging merit then we could perform another thoracentesis, however the best approach is to continue volume removal with HD  PCCM will see again 8/29  Best Practice (right click and "Reselect all SmartList Selections" daily)  Per primary   Signature:  BrRoselie AwkwardMD [place] PCCM Pager: [phone]: ([phone] 7:00 pm call Elink  ([phone]/28/2023, 9:02 AM

## 2022-01-21 NOTE — Consult Note (Signed)
   [facility] Inpatient Consult   01/21/2022  [name] 11/14/1945 [id number]  [place] [facility] [ACO] Patient: Medicare ACO REACH  Primary Care Provider:  Chevis Pretty, FNP, [facility] in an Embedded practice with a Care Coordination program and team, this provider is also listed to provide the [Transition of Care TOC] follow up  Patient screened for hospitalization with noted high risk score for unplanned readmission risk and  to assess for potential [place] Management service needs for post hospital transition.  Review of patient's medical record was performed to assess for needs for readmission prevention and post hospital care coordination needs. Unit rounds patient back from HD.  Plan:  Continue to follow progress and disposition to assess for post hospital care management needs.    For questions contact:   Natividad Brood, RN BSN [facility] Liaison  [phone] business mobile phone Toll free office [phone]  Fax number: [phone] [email or web address]

## 2022-01-21 NOTE — Progress Notes (Addendum)
[facility] NEPHROLOGY PROGRESS NOTE  Assessment/ Plan: Pt is a 76 y.o. yo female with history of HTN, HLD, hypothyroidism, PE, presented with SBO underwent emergent ex lap with lysis of adhesion on 8/17, developed anterior dependent respiratory failure, AKI requiring CRRT.  #Acute kidney injury, oligoanuric, dialysis dependent: Likely ischemic ATN from shock/sepsis.  She was on ARB prior to admission.  Initially required CRRT which was stopped on 8/21 and initiated IHD on 8/23.  HD today, she is tolerating dialysis well so far.  Seen by palliative care. Continue to watch for renal recovery,strict I/o out, daily lab.  #Septic shock, off of pressors.  Blood pressure improving.  #High-grade small bowel obstruction now status post ex lap and lysis of adhesion.  On TPN.  #Hyponatremia, hypervolemic: Recommend to minimize free water and will attempt UF during HD.  #Anemia of critical illness: Transfuse as needed.  Subjective: Seen and examined in dialysis unit.  So far tolerating well, goal UF around 2 L.  Denies nausea, vomiting, chest pain or shortness of breath. Objective Vital signs in last 24 hours: Vitals:   01/21/22 0828 01/21/22 0900 01/21/22 0930 01/21/22 1002  BP: 122/75 (!) 101/50 121/74 123/79  Pulse: 69 70 67 62  Resp: 12 (!) '23 14 13  '$ Temp:      TempSrc:      SpO2: 100% 100% 100% 100%  Weight:      Height:       Weight change: 2.6 kg  Intake/Output Summary (Last 24 hours) at 01/21/2022 1031 Last data filed at 01/21/2022 0800 Gross per 24 hour  Intake 2261.58 ml  Output 895 ml  Net 1366.58 ml       Labs: RENAL PANEL Recent Labs    01/12/22 0344 01/12/22 1423 01/12/22 2123 01/13/22 0448 01/13/22 1521 01/14/22 0400 01/14/22 1615 01/15/22 0431 01/16/22 0627 01/17/22 0115 01/17/22 0536 01/17/22 1100 01/18/22 0707 01/18/22 1145 01/19/22 0515 01/20/22 0343 01/21/22 0645  NA 134* 134* 135 137 137 138 136 138 134* 132*  --  131* 126* 130*  129* 129* 131*  K 3.5 3.5 3.4* 3.5 3.9 3.7 3.6 3.3* 3.3* 2.6*  --  3.1* 3.1* 3.3* 3.6 3.9 3.7  CL '108 108 111 110 108 109 107 107 106 102 '$  --  102 102 100 96* 97* 96*  CO2 19* 18* 14* 16* 20* 20* '24 24 22 22  '$ --  22 21* '25 24 25 26  '$ GLUCOSE 94 111* 113* 124* 157* 158* 125* 98 113* 112*  --  120* 112* 145* 121* 110* 106*  BUN 91* 93* 86* 61* 43* 31* 23 28* 43* 34*  --  41* 54* 39* 47* 60* 77*  CREATININE 5.32* 5.43* 4.83* 3.55* 2.56* 2.08* 1.64* 2.09* 2.75* 2.08*  --  2.33* 2.57* 1.92* 2.19* 2.43* 2.73*  CALCIUM 7.0* 7.3* 7.6* 7.7* 7.5* 7.4* 7.3* 7.4* 7.7* 7.1*  --  7.3* 7.1* 7.1* 7.4* 7.5* 7.7*  MG 2.1 2.5*  --  2.3  --  2.4  --  2.1 2.1 1.5*  --  1.9 1.8  --  1.8 2.0 2.1  PHOS 3.1 3.4 3.4 2.6 2.3* 1.9* 1.6* 2.0* 1.4* 2.6 2.5  --  3.2  --  3.0 3.8 4.4  ALBUMIN 2.0* 2.0* 1.8* 1.9* 1.9* 1.9* 1.8* 1.8* 1.8*  --   --   --   --   --   --   --  1.7*     Liver Function Tests: Recent Labs  Lab 01/15/22 0431  01/16/22 0627 01/21/22 0645  AST 34 25 16  ALT '20 17 16  '$ ALKPHOS 155* 128* 93  BILITOT 0.7 0.6 0.3  PROT 4.1* 4.1* 4.7*  ALBUMIN 1.8* 1.8* 1.7*   No results for input(s): "LIPASE", "AMYLASE" in the last 168 hours. No results for input(s): "AMMONIA" in the last 168 hours. CBC: Recent Labs    01/17/22 0536 01/18/22 0707 01/19/22 0730 01/20/22 0343 01/21/22 0645  HGB 8.3* 7.9* 7.4* 7.2* 7.3*  MCV 88.7 88.8 87.7 89.5 87.6    Cardiac Enzymes: No results for input(s): "CKTOTAL", "CKMB", "CKMBINDEX", "TROPONINI" in the last 168 hours. CBG: Recent Labs  Lab 01/20/22 0028 01/20/22 0548 01/20/22 1314 01/20/22 1758 01/20/22 2352  GLUCAP 107* 100* 102* 113* 99    Iron Studies: No results for input(s): "IRON", "TIBC", "TRANSFERRIN", "FERRITIN" in the last 72 hours. Studies/Results: DG CHEST PORT 1 VIEW  Result Date: 01/20/2022 CLINICAL DATA:  Pleural effusion EXAM: PORTABLE CHEST 1 VIEW COMPARISON:  January 18, 2022 FINDINGS: The right and left PICC lines are stable. No  pneumothorax. Interstitial opacities remain in the lungs, mildly improved. Stable cardiomegaly. The hila and mediastinum are unchanged. There is a left-sided pleural effusion with underlying opacity. No other changes. IMPRESSION: 1. Cardiomegaly and mild edema.  The edema appears mildly improved. 2. Probable tiny right effusion. Moderate left effusion with underlying opacity, likely atelectasis. 3. No other change. Electronically Signed   By: Dorise Bullion III M.D.   On: 01/20/2022 08:26    Medications: Infusions:  sodium chloride Stopped (01/16/22 1822)   sodium chloride Stopped (01/15/22 1255)   heparin 1,850 Units/hr (01/21/22 0953)   TPN ADULT (ION) 65 mL/hr at 01/21/22 0508   TPN ADULT (ION)      Scheduled Medications:  amiodarone  400 mg Oral BID   Followed by   Derrill Memo ON 01/23/2022] amiodarone  400 mg Oral Daily   Followed by   Derrill Memo ON 01/27/2022] amiodarone  200 mg Oral Daily   Chlorhexidine Gluconate Cloth  6 each Topical Q0600   escitalopram  10 mg Oral Daily   feeding supplement  1 Container Oral TID BM   metoprolol tartrate  12.5 mg Oral BID   sodium chloride flush  10-40 mL Intracatheter Q12H   sodium chloride flush  3 mL Intravenous Q12H    have reviewed scheduled and prn medications.  Physical Exam: General:NAD, comfortable Heart:RRR, s1s2 nl Lungs:clear b/l, no crackle Abdomen:soft, Non-tender, non-distended Extremities: Upper extremities edema noted, trace leg edema + Dialysis Access: Left IJ temporary HD catheter in place  Harkirat Orozco Prasad Aylinn Rydberg 01/21/2022,10:31 AM  LOS: 14 days

## 2022-01-21 NOTE — Progress Notes (Signed)
PROGRESS NOTE    [name]  KGM:[id number] DOB: 1945/09/26 DOA: 01/07/2022 PCP: Chevis Pretty, FNP   Brief Narrative: 76 year old with past medical history hyperlipidemia, hypertension, hypothyroidism, PE in the past, osteoarthritis, depression who presents from home with nausea vomiting and diarrhea since 8/11.  CT scan was obtained and was unremarkable.  She received IV fluids and antiemetic and was discharged home.  Her vomiting and diarrhea persisted, she presented to [facility], CT scan repeated found to have partial small bowel obstruction and significant changes compared to CT done on 8/11. Admitted 80/14 for bowel obstruction and was managed medically.  She developed acute renal failure, developed shock and underwent emergent exploratory laparotomy with lysis of adhesion on 8/17.  Intubated for Sx.  She was started on CRRT.  She was transferred to [facility] 8/19.  She was subsequently extubated 8/21st, started on TNA, CRRT discontinued.  She was started on hemodialysis first section 8/23. During hospitalization she also developed A-fib with RVR, started on amiodarone, cardiology consulted.  In hemodialysis 8/23  she was noted to be more lethargic, she was noted to have aberrantly conducted A-fib with PVCs, versus slow rate  V. Tach.  IV amiodarone was resumed.  Patient develops worsening abdominal pain, CT scan negative for abscess, no significant bowel obstruction. She was notice to have BL pleural effusion moderate. CCM consulted for evaluation for Thoracentesis. Underwent thoracentesis 8/25 yielding 1.1L pleural fluid.    Assessment & Plan:   Principal Problem:   Septic shock ([place]) Active Problems:   Respiratory failure requiring intubation (HCC)   Small bowel obstruction (HCC)   COPD (chronic obstructive pulmonary disease) (HCC)   Hyperlipidemia   Hypothyroidism (acquired)   Essential hypertension   History of pulmonary embolus (PE)   AKI (acute kidney injury)  ([place])   Acute respiratory failure with hypoxia and hypercapnia (HCC)   Pressure injury of skin   Malnutrition of moderate degree   1-SBO status post laparotomy 8/17 -NG tube has been removed.  Patient started on TPN 8/21 -Treated course of aztreonam with metronidazole, antibiotics DC 8/27. -Surgery following -CT abdomen and pelvis 8/24: , due to abdominal pain and leukocytosis. Negative for abscess, decrease bowel distension small bowel loop.  -C diff negative, E coli positive stool.  -diet advanced to Dysphagia 1 diet   2-Acute Metabolic Encephalopathy: She was more lethargic 8/23. ABG with hypoxia--- placed on oxygen CCM to evaluate, recommended support care.  Chest x ray: with pulmonary edema, PNA not excluded. CT head. Negative.  Multifactorial secondary to delirium, acute illness.  She is more alert, conversant.    3-A-fib with RVR, PVC Versus slow rate  V. tach On low dose metoprolol.  Appreciate cardiology assistance, ectopy during last HD related to electrolytes abnormalities.  On oral amiodarone.   4-Acute hypoxic respiratory failure;  Pulmonary edema BL Pleural effusion: ABG Po2 58 Placed on oxygen Chest x ray : pulmonary edema vs infection.  Antibiotics has been discontinue.  Volume manage with HD>  CT abdomen pelvis showed: Moderate to large bilateral pleural effusions. Underwent right side thoracentesis 8/25 yielding 1.1 L.  Pleural fluid not send for cell count.  Chest x ray 8/27 moderate side left thoracentesis. Plan to repeat chest x ray tomorrow to follow left side pleural effusion.    AKI likely from ATN from sepsis Treated with CRRT and discontinue 80/21 Nephrology  following and initiating intermittent hemodialysis She has been tolerating HD>    Diarrhea;  Gastroenteritis, E coli,.  -Support care.  -  ID didn't recommend antibiotics tx.   Moderate protein malnutrition: Continue with TPN  Chronic anemia: Monitor hemoglobin  Hypokalemia,  hypophosphatemia,: Resolved.  Further correction during HD>   Leukocytosis: Chest x ray PNA vs edema.  CT abdomen pelvis. Negative for abscess.  GI pathogen positive for E coli/  C diff negative.    See wound care documentation below  Pressure Injury 01/10/22 Coccyx Medial;Mid Stage 2 -  Partial thickness loss of dermis presenting as a shallow open injury with a red, pink wound bed without slough. pink (Active)  01/10/22 1200  Location: Coccyx  Location Orientation: Medial;Mid  Staging: Stage 2 -  Partial thickness loss of dermis presenting as a shallow open injury with a red, pink wound bed without slough.  Wound Description (Comments): pink  Present on Admission:   Dressing Type None 01/20/22 2130     Pressure Injury 01/12/22 Face Left Stage 2 -  Partial thickness loss of dermis presenting as a shallow open injury with a red, pink wound bed without slough. red ulceration beneath ETT holder on L cheek (Active)  01/12/22 1815  Location: Face  Location Orientation: Left  Staging: Stage 2 -  Partial thickness loss of dermis presenting as a shallow open injury with a red, pink wound bed without slough.  Wound Description (Comments): red ulceration beneath ETT holder on L cheek  Present on Admission: Yes  Dressing Type None 01/20/22 2130     Pressure Injury 01/16/22 Lumbar Left (Active)  01/16/22 1230  Location: Lumbar  Location Orientation: Left  Staging:   Wound Description (Comments):   Present on Admission: Yes  Dressing Type None 01/19/22 2000     Pressure Injury 01/16/22 Buttocks Left (Active)  01/16/22 1230  Location: Buttocks  Location Orientation: Left  Staging:   Wound Description (Comments):   Present on Admission: Yes  Dressing Type None 01/19/22 2000     Nutrition Problem: Moderate Malnutrition Etiology: acute illness (SBO)    Signs/Symptoms: energy intake < 75% for > 7 days, mild muscle depletion, moderate fat depletion    Interventions:  TPN  Estimated body mass index is 41.63 kg/m as calculated from the following:   Height as of this encounter: '5\' 4"'$  (1.626 m).   Weight as of this encounter: 110 kg.   DVT prophylaxis: Heparin drip Code Status: Full code Family Communication: Daughter in law updated /8/28 Disposition Plan:  Status is: Inpatient Remains inpatient appropriate because: Remain for management of SBO, status postsurgery, A-fib, renal failure    Consultants:  Nephrology CCM Surgery  Procedures:    Antimicrobials:    Subjective: She is alert, answer questions. Abdominal pain is on and off, controlled with pain medications.  She denies dyspnea.  She has been moving her legs that helps with pain.  She is in better spirit.   Objective: Vitals:   01/21/22 1002 01/21/22 1030 01/21/22 1100 01/21/22 1130  BP: 123/79 122/75 129/72 135/85  Pulse: 62 75 79 88  Resp: '13 17 13 15  '$ Temp:      TempSrc:      SpO2: 100% 99% 98% 96%  Weight:      Height:        Intake/Output Summary (Last 24 hours) at 01/21/2022 1152 Last data filed at 01/21/2022 0800 Gross per 24 hour  Intake 2261.58 ml  Output 895 ml  Net 1366.58 ml    Filed Weights   01/19/22 0500 01/20/22 0611 01/21/22 0656  Weight: 105.8 kg 107.4 kg 110 kg  Examination:  General exam: NAD Respiratory system: BL air movement.  Cardiovascular system: S1, S 2 RRR Gastrointestinal system: BS , present, staple  in place.  Central nervous system: She is alert, follows command.  Extremities: trace 1 edema \  Data Reviewed: I have personally reviewed following labs and imaging studies  CBC: Recent Labs  Lab 01/17/22 0536 01/18/22 0707 01/19/22 0730 01/20/22 0343 01/21/22 0645  WBC 20.2* 19.1* 17.2* 14.3* 14.3*  HGB 8.3* 7.9* 7.4* 7.2* 7.3*  HCT 25.8* 24.7* 22.9* 23.1* 22.6*  MCV 88.7 88.8 87.7 89.5 87.6  PLT 218 259 306 354 417*    Basic Metabolic Panel: Recent Labs  Lab 01/17/22 0536 01/17/22 1100 01/18/22 0707  01/18/22 1145 01/19/22 0515 01/20/22 0343 01/21/22 0645  NA  --  131* 126* 130* 129* 129* 131*  K  --  3.1* 3.1* 3.3* 3.6 3.9 3.7  CL  --  102 102 100 96* 97* 96*  CO2  --  22 21* '25 24 25 26  '$ GLUCOSE  --  120* 112* 145* 121* 110* 106*  BUN  --  41* 54* 39* 47* 60* 77*  CREATININE  --  2.33* 2.57* 1.92* 2.19* 2.43* 2.73*  CALCIUM  --  7.3* 7.1* 7.1* 7.4* 7.5* 7.7*  MG  --  1.9 1.8  --  1.8 2.0 2.1  PHOS 2.5  --  3.2  --  3.0 3.8 4.4    GFR: Estimated Creatinine Clearance: 21.6 mL/min (A) (by C-G formula based on SCr of 2.73 mg/dL (H)). Liver Function Tests: Recent Labs  Lab 01/14/22 1615 01/15/22 0431 01/16/22 0627 01/21/22 0645  AST  --  34 25 16  ALT  --  '20 17 16  '$ ALKPHOS  --  155* 128* 93  BILITOT  --  0.7 0.6 0.3  PROT  --  4.1* 4.1* 4.7*  ALBUMIN 1.8* 1.8* 1.8* 1.7*    No results for input(s): "LIPASE", "AMYLASE" in the last 168 hours. No results for input(s): "AMMONIA" in the last 168 hours. Coagulation Profile: No results for input(s): "INR", "PROTIME" in the last 168 hours. Cardiac Enzymes: No results for input(s): "CKTOTAL", "CKMB", "CKMBINDEX", "TROPONINI" in the last 168 hours. BNP (last 3 results) No results for input(s): "PROBNP" in the last 8760 hours. HbA1C: No results for input(s): "HGBA1C" in the last 72 hours. CBG: Recent Labs  Lab 01/20/22 0028 01/20/22 0548 01/20/22 1314 01/20/22 1758 01/20/22 2352  GLUCAP 107* 100* 102* 113* 99    Lipid Profile: Recent Labs    01/21/22 0645  TRIG 18    Thyroid Function Tests: No results for input(s): "TSH", "T4TOTAL", "FREET4", "T3FREE", "THYROIDAB" in the last 72 hours. Anemia Panel: No results for input(s): "VITAMINB12", "FOLATE", "FERRITIN", "TIBC", "IRON", "RETICCTPCT" in the last 72 hours. Sepsis Labs: No results for input(s): "PROCALCITON", "LATICACIDVEN" in the last 168 hours.   Recent Results (from the past 240 hour(s))  Culture, Respiratory w Gram Stain     Status: None    Collection Time: 01/12/22  6:14 PM   Specimen: Tracheal Aspirate; Respiratory  Result Value Ref Range Status   Specimen Description TRACHEAL ASPIRATE  Final   Special Requests NONE  Final   Gram Stain NO WBC SEEN NO ORGANISMS SEEN   Final   Culture   Final    RARE Normal respiratory flora-no Staph aureus or Pseudomonas seen Performed at [facility], [address], [place], [place] [address]    Report Status 01/14/2022 FINAL  Final  MRSA  Next Gen by PCR, Nasal     Status: None   Collection Time: 01/12/22  6:50 PM   Specimen: Nasal Mucosa; Nasal Swab  Result Value Ref Range Status   MRSA by PCR Next Gen NOT DETECTED NOT DETECTED Final    Comment: (NOTE) The GeneXpert MRSA Assay (FDA approved for NASAL specimens only), is one component of a comprehensive MRSA colonization surveillance program. It is not intended to diagnose MRSA infection nor to guide or monitor treatment for MRSA infections. Test performance is not FDA approved in patients less than 72 years old. Performed at [facility], [place] [address], [place], [place] [address]   Gastrointestinal Panel by PCR , Stool     Status: Abnormal   Collection Time: 01/18/22  3:42 PM   Specimen: Stool  Result Value Ref Range Status   Campylobacter species NOT DETECTED NOT DETECTED Final   Plesimonas shigelloides NOT DETECTED NOT DETECTED Final   Salmonella species NOT DETECTED NOT DETECTED Final   Yersinia enterocolitica NOT DETECTED NOT DETECTED Final   Vibrio species NOT DETECTED NOT DETECTED Final   Vibrio cholerae NOT DETECTED NOT DETECTED Final   Enteroaggregative E coli (EAEC) NOT DETECTED NOT DETECTED Final   Enterotoxigenic E coli (ETEC) NOT DETECTED NOT DETECTED Final   Shiga like toxin producing E coli (STEC) DETECTED (A) NOT DETECTED Final    Comment: RESULT CALLED TO, READ BACK BY AND VERIFIED WITH: NAA DORMON 01/20/22 1417 AMK    E. coli O157 NOT DETECTED NOT DETECTED Final   Shigella/Enteroinvasive E  coli (EIEC) NOT DETECTED NOT DETECTED Final   Cryptosporidium NOT DETECTED NOT DETECTED Final   Cyclospora cayetanensis NOT DETECTED NOT DETECTED Final   Entamoeba histolytica NOT DETECTED NOT DETECTED Final   Giardia lamblia NOT DETECTED NOT DETECTED Final   Adenovirus F40/41 NOT DETECTED NOT DETECTED Final   Astrovirus NOT DETECTED NOT DETECTED Final   Norovirus GI/GII NOT DETECTED NOT DETECTED Final   Rotavirus A NOT DETECTED NOT DETECTED Final   Sapovirus (I, II, IV, and V) NOT DETECTED NOT DETECTED Final    Comment: Performed at [facility], [address] [place], [place] [address]  C Difficile Quick Screen (NO PCR Reflex)     Status: None   Collection Time: 01/19/22  6:32 PM   Specimen: STOOL  Result Value Ref Range Status   C Diff antigen NEGATIVE NEGATIVE Final   C Diff toxin NEGATIVE NEGATIVE Final   C Diff interpretation No C. difficile detected.  Final    Comment: Performed at [facility], [place] [address], [place],  [address]         Radiology Studies: DG CHEST PORT 1 VIEW  Result Date: 01/20/2022 CLINICAL DATA:  Pleural effusion EXAM: PORTABLE CHEST 1 VIEW COMPARISON:  January 18, 2022 FINDINGS: The right and left PICC lines are stable. No pneumothorax. Interstitial opacities remain in the lungs, mildly improved. Stable cardiomegaly. The hila and mediastinum are unchanged. There is a left-sided pleural effusion with underlying opacity. No other changes. IMPRESSION: 1. Cardiomegaly and mild edema.  The edema appears mildly improved. 2. Probable tiny right effusion. Moderate left effusion with underlying opacity, likely atelectasis. 3. No other change. Electronically Signed   By: Dorise Bullion III M.D.   On: 01/20/2022 08:26        Scheduled Meds:  amiodarone  400 mg Oral BID   Followed by   Derrill Memo ON 01/23/2022] amiodarone  400 mg Oral Daily  Followed by   [START ON 01/27/2022] amiodarone  200 mg Oral Daily   Chlorhexidine Gluconate Cloth  6  each Topical Q0600   escitalopram  10 mg Oral Daily   feeding supplement  1 Container Oral TID BM   heparin sodium (porcine)       metoprolol tartrate  12.5 mg Oral BID   sodium chloride flush  10-40 mL Intracatheter Q12H   sodium chloride flush  3 mL Intravenous Q12H   Continuous Infusions:  sodium chloride Stopped (01/16/22 1822)   sodium chloride Stopped (01/15/22 1255)   heparin 1,850 Units/hr (01/21/22 0953)   TPN ADULT (ION) 65 mL/hr at 01/21/22 0508   TPN ADULT (ION)       LOS: 14 days    Time spent: 35 minutes    Melady Chow A Shante Maysonet, MD [facility]   If 7PM-7AM, please contact night-coverage [email or web address]  01/21/2022, 11:52 AM

## 2022-01-21 NOTE — Progress Notes (Signed)
[place] for Heparin Indication: atrial fibrillation  Allergies  Allergen Reactions   Celebrex [Celecoxib] Swelling   Keflet [Cephalexin] Swelling   Penicillins Hives and Swelling    DID THE REACTION INVOLVE: Swelling of the face/tongue/throat, SOB, or low BP? Yes-swelling-hives Sudden or severe rash/hives, skin peeling, or the inside of the mouth or nose? Unknown Did it require medical treatment? Unknown When did it last happen?    over 10 years   If all above answers are "NO", may proceed with cephalosporin use.    Sulfa Antibiotics Swelling   Kenalog [Triamcinolone Acetonide]     unknown   Latex    Lisinopril Other (See Comments)    unknown   Mobic [Meloxicam]     SWELLING    Patient Measurements: Height: '5\' 4"'$  (162.6 cm) Weight: 107.5 kg (236 lb 15.9 oz) IBW/kg (Calculated) : 54.7  Heparin Dosing Weight: 72 kg  Vital Signs: Temp: 97.5 F (36.4 C) (08/28 1254) Temp Source: Oral (08/28 1254) BP: 111/60 (08/28 1254) Pulse Rate: 77 (08/28 1254)  Labs: Recent Labs    01/19/22 0515 01/19/22 0730 01/19/22 0730 01/20/22 0343 01/21/22 0645  HGB  --  7.4*   < > 7.2* 7.3*  HCT  --  22.9*  --  23.1* 22.6*  PLT  --  306  --  354 417*  HEPARINUNFRC 0.45  --   --  0.51 0.65  CREATININE 2.19*  --   --  2.43* 2.73*   < > = values in this interval not displayed.    Estimated Creatinine Clearance: 21.3 mL/min (A) (by C-G formula based on SCr of 2.73 mg/dL (H)).   Assessment: Pharmacy consulted to dose heparin infusion for post-op afib. Patient was not on any AC PTA.  She is noted s/p ex lap and on TPN.   Heparin level therapeutic at 0.65 on 1850 units/hr. Hgb 7.3  Goal of Therapy:  Heparin level 0.3-0.7 units/ml Monitor platelets by anticoagulation protocol: Yes   Plan:  Continue heparin infusion at 1850 units/hr Check daily heparin level and CBC  Hildred Laser, PharmD Clinical Pharmacist **Pharmacist phone directory can  now be found on [email or web address] ([email or web address]).  Listed under [place].

## 2022-01-21 NOTE — Progress Notes (Signed)
PHARMACY - TOTAL PARENTERAL NUTRITION CONSULT NOTE   Indication: Prolonged ileus  Patient Measurements: Height: _0  (162.6 cm) Weight: 110 kg (242 lb 8.1 oz) IBW/kg (Calculated) : 54.7 TPN AdjBW (KG): 60.7 Body mass index is 41.63 kg/m.  Assessment: 76 yo female with septic shock, high-grade small bowel obstruction. Underwent surgery on 8/17 with ex lap and lysis of adhesions for SBO. Reported vomiting and diarrhea since 8/11 up until surgery on 8/17. Pharmacy consulted for TPN due to prolonged ileus.  Glucose / Insulin: no hx DM - CBGs < 180.  SSI/CBG checked D/C'ed 01/18/22. Electrolytes:  8/26 labs post HD - low Na/CL 129/96, K 3.6 post ~5 runs (goal >/= 4), Mag 2.1 (goal >/= 2), others WNL  8/27 labs pre HD - low Na/CL (unchanged), K 3.9 post 3 runs and 49mq/d increase in TPN, Mag 2 post 2gm and 325m/d increase in TPN, others WNL (Phos trending up)  8/28 labs preHD - low Na/CL (unchanged), K 3.7 - for HD today, Mag 2.1, others WNL  Renal: off CRRT 8/21 >> iHD using 4K baths, next HD tentatively today Hepatic: AST / ALT / tbili / TG WNL, Alk Phos down WNL, albumin 1.7 Intake / Output; MIVF: UOP 2503mstool 295m2m Imaging:  8/24 CT abd: decr gas distention, anasarca throughout abd wall, distention of lower abdominal/pelvic small bowel loops  8/23: persistent SBO vs. post-op ileus, suspected ascites  GI Surgeries / Procedures:  8/17 with ex lap and LOA for SBO  Central access: triple lumen CVC placed 01/10/22 TPN start date: 01/14/22  Nutritional Goals:  RD Estimated Needs Total Energy Estimated Needs: 1700-1900 Total Protein Estimated Needs: 100-120 gm Total Fluid Estimated Needs: 1L + UOP  Current Nutrition:  TPN Dys 1 Boost TIDBW - 1 charted given yesterday  Plan:  Continue concentrated TPN at goal rate of 65 ml/hr to provide 110g AA and 1811 kCal, meeting 100% of nutrition needs Electrolytes in TPN: increase Na to 150mE69m increase K 15mEq37mn 8/26 ( = 23mEq/64m Ca 5mEq/L,67mcrease Mg 7mEq/L o69m/26, decrease Phos further to 5mmol/L, 59mnge Cl:Ac to 1:2 Add standard MVI and trace elements to TPN - no chromium since on iHD Monitor TPN labs on Mon/Thurs F/u PO intake/diet advancement  Thank you for involving pharmacy in this patient's care.  Sonali Wivell DRenold GentaBCPS Clinical Pharmacist Clinical phone for 01/21/2022 until 3p is [phone] [id number] 7:12 AM

## 2022-01-22 ENCOUNTER — Inpatient Hospital Stay (HOSPITAL_COMMUNITY): Payer: Medicare Other

## 2022-01-22 DIAGNOSIS — R6521 Severe sepsis with septic shock: Secondary | ICD-10-CM | POA: Diagnosis not present

## 2022-01-22 DIAGNOSIS — A419 Sepsis, unspecified organism: Secondary | ICD-10-CM | POA: Diagnosis not present

## 2022-01-22 LAB — RENAL FUNCTION PANEL
Albumin: 1.7 g/dL — ABNORMAL LOW (ref 3.5–5.0)
Anion gap: 7 (ref 5–15)
BUN: 59 mg/dL — ABNORMAL HIGH (ref 8–23)
CO2: 28 mmol/L (ref 22–32)
Calcium: 7.8 mg/dL — ABNORMAL LOW (ref 8.9–10.3)
Chloride: 99 mmol/L (ref 98–111)
Creatinine, Ser: 2.14 mg/dL — ABNORMAL HIGH (ref 0.44–1.00)
GFR, Estimated: 24 mL/min — ABNORMAL LOW (ref >60)
Glucose, Bld: 145 mg/dL — ABNORMAL HIGH (ref 70–99)
Phosphorus: 3.7 mg/dL (ref 2.5–4.6)
Potassium: 3.8 mmol/L (ref 3.5–5.1)
Sodium: 134 mmol/L — ABNORMAL LOW (ref 135–145)

## 2022-01-22 LAB — GLUCOSE, CAPILLARY
Glucose-Capillary: 118 mg/dL — ABNORMAL HIGH (ref 70–99)
Glucose-Capillary: 116 mg/dL — ABNORMAL HIGH (ref 70–99)

## 2022-01-22 LAB — MAGNESIUM: Magnesium: 1.9 mg/dL (ref 1.7–2.4)

## 2022-01-22 LAB — HEMOGLOBIN AND HEMATOCRIT, BLOOD
HCT: 22.2 % — ABNORMAL LOW (ref 36.0–46.0)
Hemoglobin: 7.1 g/dL — ABNORMAL LOW (ref 12.0–15.0)

## 2022-01-22 LAB — HEPARIN LEVEL (UNFRACTIONATED): Heparin Unfractionated: 0.6 IU/mL (ref 0.30–0.70)

## 2022-01-22 LAB — PREPARE RBC (CROSSMATCH)

## 2022-01-22 MED ORDER — GUAIFENESIN ER 600 MG PO TB12
600.0000 mg | ORAL_TABLET | Freq: Two times a day (BID) | ORAL | Status: DC
  Administered 2022-01-22 – 2022-02-03 (×26): 600 mg via ORAL
  Filled 2022-01-22 (×26): qty 1

## 2022-01-22 MED ORDER — NEPRO/CARBSTEADY PO LIQD
237.0000 mL | Freq: Two times a day (BID) | ORAL | Status: DC
  Administered 2022-01-22 – 2022-02-02 (×10): 237 mL via ORAL

## 2022-01-22 MED ORDER — FUROSEMIDE 10 MG/ML IJ SOLN
40.0000 mg | Freq: Once | INTRAMUSCULAR | Status: AC
  Administered 2022-01-22: 40 mg via INTRAVENOUS
  Filled 2022-01-22: qty 4

## 2022-01-22 MED ORDER — TRACE MINERALS CU-MN-SE-ZN 300-55-60-3000 MCG/ML IV SOLN
INTRAVENOUS | Status: AC
  Filled 2022-01-22: qty 725

## 2022-01-22 MED ORDER — SODIUM CHLORIDE 0.9% IV SOLUTION: Freq: Once | INTRAVENOUS | Status: AC

## 2022-01-22 MED ORDER — FOLIC ACID 1 MG PO TABS
1.0000 mg | ORAL_TABLET | Freq: Every day | ORAL | Status: DC
  Administered 2022-01-22 – 2022-02-04 (×14): 1 mg via ORAL
  Filled 2022-01-22 (×14): qty 1

## 2022-01-22 MED ORDER — FERROUS SULFATE 325 (65 FE) MG PO TABS
325.0000 mg | ORAL_TABLET | Freq: Every day | ORAL | Status: DC
  Administered 2022-01-22 – 2022-02-03 (×13): 325 mg via ORAL
  Filled 2022-01-22 (×13): qty 1

## 2022-01-22 NOTE — Care Management Important Message (Signed)
Important Message  Patient Details  Name: [name] MRN: [id number] Date of Birth: Jul 09, 1945   Medicare Important Message Given:  Yes     Shelda Altes 01/22/2022, 9:57 AM

## 2022-01-22 NOTE — Progress Notes (Addendum)
[facility] NEPHROLOGY PROGRESS NOTE  Assessment/ Plan: Pt is a 76 y.o. yo female with history of HTN, HLD, hypothyroidism, PE, presented with SBO underwent emergent ex lap with lysis of adhesion on 8/17, developed anterior dependent respiratory failure, AKI requiring CRRT.  #Acute kidney injury, oligoanuric, dialysis dependent: Likely ischemic ATN from shock/sepsis.  She was on ARB prior to admission.  Initially required CRRT which was stopped on 8/21 and initiated IHD on 8/23.  Seen by palliative care team. Status post HD yesterday with around 2.5 L UF, tolerated well.  The urine output is slightly going up as well. I will review urine output and morning labs to decide dialysis tomorrow. Continue to watch for renal recovery,strict I/o out, daily lab.  #Septic shock, off of pressors.  Blood pressure improving.  #High-grade small bowel obstruction now status post ex lap and lysis of adhesion.  On TPN.  #Hyponatremia, hypervolemic: Recommend to minimize free water and will attempt UF during HD.  #Anemia of critical illness: Transfuse as needed.  Subjective: Seen and examined.  She had dialysis yesterday with around 2.5 L UF, urine output noted 500 cc.  Denies chest pain or shortness of breath.  She feels fatigued.  Objective Vital signs in last 24 hours: Vitals:   01/21/22 1254 01/21/22 1322 01/21/22 2120 01/22/22 0323  BP: 111/60 111/60 109/60 117/64  Pulse: 77 77 66 71  Resp: 16  (!) 21 16  Temp: (!) 97.5 F (36.4 C)  97.7 F (36.5 C) 97.6 F (36.4 C)  TempSrc: Oral  Oral Oral  SpO2: 95%  96% 95%  Weight:    105.4 kg  Height:       Weight change: -2.5 kg  Intake/Output Summary (Last 24 hours) at 01/22/2022 1138 Last data filed at 01/21/2022 2100 Gross per 24 hour  Intake 1392.06 ml  Output 352.5 ml  Net 1039.56 ml        Labs: RENAL PANEL Recent Labs    01/12/22 1423 01/12/22 2123 01/13/22 0448 01/13/22 1521 01/14/22 0400 01/14/22 1615  01/15/22 0431 01/16/22 0627 01/17/22 0115 01/17/22 0536 01/17/22 1100 01/18/22 0707 01/18/22 1145 01/19/22 0515 01/20/22 0343 01/21/22 0645 01/22/22 0415 01/22/22 0814  NA 134* 135 137 137 138 136 138 134* 132*  --  131* 126* 130* 129* 129* 131*  --  134*  K 3.5 3.4* 3.5 3.9 3.7 3.6 3.3* 3.3* 2.6*  --  3.1* 3.1* 3.3* 3.6 3.9 3.7  --  3.8  CL 108 111 110 108 109 107 107 106 102  --  102 102 100 96* 97* 96*  --  99  CO2 18* 14* 16* 20* 20* '24 24 22 22  '$ --  22 21* '25 24 25 26  '$ --  28  GLUCOSE 111* 113* 124* 157* 158* 125* 98 113* 112*  --  120* 112* 145* 121* 110* 106*  --  145*  BUN 93* 86* 61* 43* 31* 23 28* 43* 34*  --  41* 54* 39* 47* 60* 77*  --  59*  CREATININE 5.43* 4.83* 3.55* 2.56* 2.08* 1.64* 2.09* 2.75* 2.08*  --  2.33* 2.57* 1.92* 2.19* 2.43* 2.73*  --  2.14*  CALCIUM 7.3* 7.6* 7.7* 7.5* 7.4* 7.3* 7.4* 7.7* 7.1*  --  7.3* 7.1* 7.1* 7.4* 7.5* 7.7*  --  7.8*  MG 2.5*  --  2.3  --  2.4  --  2.1 2.1 1.5*  --  1.9 1.8  --  1.8 2.0 2.1 1.9  --  PHOS 3.4 3.4 2.6 2.3* 1.9* 1.6* 2.0* 1.4* 2.6 2.5  --  3.2  --  3.0 3.8 4.4  --  3.7  ALBUMIN 2.0* 1.8* 1.9* 1.9* 1.9* 1.8* 1.8* 1.8*  --   --   --   --   --   --   --  1.7*  --  1.7*      Liver Function Tests: Recent Labs  Lab 01/16/22 0627 01/21/22 0645 01/22/22 0814  AST 25 16  --   ALT 17 16  --   ALKPHOS 128* 93  --   BILITOT 0.6 0.3  --   PROT 4.1* 4.7*  --   ALBUMIN 1.8* 1.7* 1.7*    No results for input(s): "LIPASE", "AMYLASE" in the last 168 hours. No results for input(s): "AMMONIA" in the last 168 hours. CBC: Recent Labs    01/18/22 0707 01/19/22 0730 01/20/22 0343 01/21/22 0645 01/22/22 0429  HGB 7.9* 7.4* 7.2* 7.3* 7.1*  MCV 88.8 87.7 89.5 87.6  --      Cardiac Enzymes: No results for input(s): "CKTOTAL", "CKMB", "CKMBINDEX", "TROPONINI" in the last 168 hours. CBG: Recent Labs  Lab 01/20/22 1758 01/20/22 2352 01/21/22 1251 01/21/22 1829 01/22/22 0146  GLUCAP 113* 99 126* 112* 118*     Iron  Studies: No results for input(s): "IRON", "TIBC", "TRANSFERRIN", "FERRITIN" in the last 72 hours. Studies/Results: DG Chest 2 View  Result Date: 01/22/2022 CLINICAL DATA:  Follow-up pleural effusion EXAM: CHEST - 2 VIEW COMPARISON:  Radiographs 01/20/2022 FINDINGS: Decreased size of the layering left pleural effusion since 01/20/2022. Remainder not substantially changed. Left-greater-than-right basilar airspace opacities. Bilateral interstitial opacities. No pneumothorax. Cardiomegaly. Aortic calcification. Right CVC tip in the right atrium.  Left IJ CVC tip in the low SVC. IMPRESSION: 1. Decreased size of the left pleural effusion. 2. Cardiomegaly and mild edema. Electronically Signed   By: Placido Sou M.D.   On: 01/22/2022 08:02    Medications: Infusions:  sodium chloride Stopped (01/16/22 1822)   sodium chloride Stopped (01/15/22 1255)   heparin 1,850 Units/hr (01/21/22 2320)   TPN ADULT (ION) 65 mL/hr at 15/17/61 6073   TPN CYCLIC-ADULT (ION)      Scheduled Medications:  amiodarone  400 mg Oral BID   Followed by   Derrill Memo ON 01/23/2022] amiodarone  400 mg Oral Daily   Followed by   Derrill Memo ON 01/27/2022] amiodarone  200 mg Oral Daily   Chlorhexidine Gluconate Cloth  6 each Topical Q0600   escitalopram  10 mg Oral Daily   feeding supplement (NEPRO CARB STEADY)  237 mL Oral BID BM   metoprolol tartrate  12.5 mg Oral BID   sodium chloride flush  10-40 mL Intracatheter Q12H   sodium chloride flush  3 mL Intravenous Q12H    have reviewed scheduled and prn medications.  Physical Exam: General:NAD, comfortable Heart:RRR, s1s2 nl Lungs:clear b/l, no crackle Abdomen:soft, Non-tender, non-distended Extremities: Upper extremities edema noted, trace leg edema + Dialysis Access: Left IJ temporary HD catheter in place  Dempsey Knotek Prasad Deamber Buckhalter 01/22/2022,11:38 AM  LOS: 15 days

## 2022-01-22 NOTE — Progress Notes (Signed)
PT Lymphedema Management Note:  Patient seen for wrapping to L UE for lymphedema prior to compression sleeve.  Patient tolerated well wrapped with stockinette, Artiflex and two 8cm Comprilan bandages and two 10cm bandages.  Educated on removal if severe pain, numbness or pallor of fingers.  Patient voiced understanding and states her legs have been wrapped in the past.  Son present and voiced understanding as well. Discussed plan to return for check next day. Measurements as follows:  L UE(in cm)  R UE(in cm) MCP joints 21.2   20.6 Wrist  21.0   19.8 10 cm prox 32.0   28.4 10 cm prox  40.8   35.5 Elbow crease 41.0   32.5 10 cm prox 35.7   31.5    01/22/22 1813  PT Visit Information  Assistance Needed +2  History of Present Illness 76 yo female admitted 8/14 to [facility] with vomiting and diarrhea with partial SBO. 8/17 intubated and ex lap. 8/19 pt with worsening renal function, transfer to [facility] and CRRT started. 8/21 extubation with CRRT off. PMHx: COPD, HLD, HTN, PE, hypothyroidism, OA and depression  Precautions  Precautions Fall  Precaution Comments enteric precautions  Pain Assessment  Faces Pain Scale 2  Pain Location L shoulder with movement  Pain Descriptors / Indicators Grimacing;Guarding  Cognition  Arousal/Alertness Awake/alert  Behavior During Therapy WFL for tasks assessed/performed  Overall Cognitive Status Impaired/Different from baseline  Area of Impairment Memory;Attention;Following commands;Safety/judgement;Problem solving  Current Attention Level Sustained  Memory Decreased short-term memory  Following Commands Follows one step commands consistently;Follows one step commands with increased time  Safety/Judgement Decreased awareness of deficits  Problem Solving Slow processing  Other Exercises  Other Exercises Encouraged hand, wrist and elbow movement after wrapping for improved lymphatic drainage with wraps.  PT - End of Session  Equipment Utilized During Treatment  Oxygen;Other (comment) (lymphedema wraps)  Activity Tolerance Patient tolerated treatment well  Patient left in bed;with call bell/phone within reach;with family/visitor present  Nurse Communication Other (comment) (L UE wrapped, pt requesting cough medication)   PT - Assessment/Plan  PT Plan Current plan remains appropriate  PT Visit Diagnosis Other abnormalities of gait and mobility (R26.89);Difficulty in walking, not elsewhere classified (R26.2);Muscle weakness (generalized) (M62.81)  PT Frequency (ACUTE ONLY) Min 3X/week  Follow Up Recommendations Skilled nursing-short term rehab (<3 hours/day)  Can patient physically be transported by private vehicle No  Assistance recommended at discharge Frequent or constant Supervision/Assistance  Patient can return home with the following Two people to help with walking and/or transfers;Direct supervision/assist for medications management;Two people to help with bathing/dressing/bathroom;Assist for transportation;Direct supervision/assist for financial management  PT equipment Other (comment);Hospital bed  AM-PAC PT "6 Clicks" Mobility Outcome Measure (Version 2)  Help needed turning from your back to your side while in a flat bed without using bedrails? 2  Help needed moving from lying on your back to sitting on the side of a flat bed without using bedrails? 1  Help needed moving to and from a bed to a chair (including a wheelchair)? 1  Help needed standing up from a chair using your arms (e.g., wheelchair or bedside chair)? 1  Help needed to walk in hospital room? 1  Help needed climbing 3-5 steps with a railing?  1  6 Click Score 7  Consider Recommendation of Discharge To: CIR/SNF/LTACH  Progressive Mobility  What is the highest level of mobility based on the progressive mobility assessment? Level 1 (Bedfast) - Unable to balance while sitting on edge of  bed  PT Goal Progression  Progress towards PT goals Progressing toward goals  PT Time  Calculation  PT Start Time (ACUTE ONLY) 1113  PT Stop Time (ACUTE ONLY) 1137  PT Time Calculation (min) (ACUTE ONLY) 24 min  PT General Charges  $$ ACUTE PT VISIT 1 Visit  PT Treatments  $Therapeutic Activity 23-37 mins   Magda Kiel, PT Acute Rehabilitation Services [phone] 01/22/2022

## 2022-01-22 NOTE — Progress Notes (Signed)
Physical Therapy Treatment Patient Details Name: [name] MRN: [id number] DOB: [birth year] Today's Date: 01/22/2022   History of Present Illness 76 yo female admitted 8/14 to [facility] with vomiting and diarrhea with partial SBO. 8/17 intubated and ex lap. 8/19 pt with worsening renal function, transfer to [facility] and CRRT started. 8/21 extubation with CRRT off. PMHx: COPD, HLD, HTN, PE, hypothyroidism, OA and depression    PT Comments    Patient limited by fatigue and having just having been cleaned up from BM as flexiseal removed.  Patient also awaiting blood transfusion per son.  She did need more help today to stand.  Feel she will need STSNF level rehab at d/c.  PT will continue to follow acutely.    Recommendations for follow up therapy are one component of a multi-disciplinary discharge planning process, led by the attending physician.  Recommendations may be updated based on patient status, additional functional criteria and insurance authorization.  Follow Up Recommendations  Skilled nursing-short term rehab (<3 hours/day) Can patient physically be transported by private vehicle: No   Assistance Recommended at Discharge Frequent or constant Supervision/Assistance  Patient can return home with the following Two people to help with walking and/or transfers;Direct supervision/assist for medications management;Two people to help with bathing/dressing/bathroom;Assist for transportation;Direct supervision/assist for financial management   Equipment Recommendations  Other (comment);Hospital bed    Recommendations for Other Services       Precautions / Restrictions Precautions Precautions: Fall Precaution Comments: incontinent of stool; enteric precautions     Mobility  Bed Mobility Overal bed mobility: Needs Assistance Bed Mobility: Supine to Sit, Sit to Supine Rolling: Max assist, +2 for safety/equipment   Supine to sit: Max assist, HOB elevated, +2 for safety/equipment Sit to  supine: +2 for physical assistance, Max assist   General bed mobility comments: assist for legs off bed and to lift trunk, +2 to scoot hips to EOB; to supine assist for legs and trunk and assist to roll for hygiene due to fecal incontinence    Transfers Overall transfer level: Needs assistance Equipment used:  (pulling up on back of recliner) Transfers: Sit to/from Stand Sit to Stand: From elevated surface, Max assist, Mod assist, +2 physical assistance           General transfer comment: first attempt max A +2 lifting with bed pad under her, second attempt more mod +2 and maintained standing about 20 seconds    Ambulation/Gait               General Gait Details: unable   Stairs             Wheelchair Mobility    Modified Rankin (Stroke Patients Only)       Balance Overall balance assessment: Needs assistance Sitting-balance support: Feet supported Sitting balance-Leahy Scale: Poor Sitting balance - Comments: reliant on UE support of bed or railing   Standing balance support: Bilateral upper extremity supported Standing balance-Leahy Scale: Poor Standing balance comment: stood holding back of recliner for about 20 seconds with cues for upright posture, looking out window, etc                            Cognition Arousal/Alertness: Awake/alert Behavior During Therapy: WFL for tasks assessed/performed Overall Cognitive Status: Impaired/Different from baseline Area of Impairment: Memory, Attention, Following commands, Safety/judgement, Problem solving                   Current Attention Level:  Sustained Memory: Decreased short-term memory Following Commands: Follows one step commands consistently, Follows one step commands with increased time Safety/Judgement: Decreased awareness of deficits   Problem Solving: Slow processing, Decreased initiation, Requires verbal cues          Exercises      General Comments General comments  (skin integrity, edema, etc.): VSS on O2      Pertinent Vitals/Pain Pain Assessment Faces Pain Scale: Hurts little more Pain Location: generalized Pain Descriptors / Indicators: Aching, Grimacing, Discomfort Pain Intervention(s): Monitored during session, Repositioned, Limited activity within patient's tolerance    Home Living                          Prior Function            PT Goals (current goals can now be found in the care plan section) Progress towards PT goals: Progressing toward goals    Frequency    Min 3X/week      PT Plan Current plan remains appropriate    Co-evaluation              AM-PAC PT "6 Clicks" Mobility   Outcome Measure  Help needed turning from your back to your side while in a flat bed without using bedrails?: A Lot Help needed moving from lying on your back to sitting on the side of a flat bed without using bedrails?: Total Help needed moving to and from a bed to a chair (including a wheelchair)?: Total Help needed standing up from a chair using your arms (e.g., wheelchair or bedside chair)?: Total Help needed to walk in hospital room?: Total Help needed climbing 3-5 steps with a railing? : Total 6 Click Score: 7    End of Session Equipment Utilized During Treatment: Gait belt;Oxygen Activity Tolerance: Patient limited by fatigue Patient left: in bed;with call bell/phone within reach;with bed alarm set   PT Visit Diagnosis: Other abnormalities of gait and mobility (R26.89);Difficulty in walking, not elsewhere classified (R26.2);Muscle weakness (generalized) (M62.81)     Time: 3220-2542 PT Time Calculation (min) (ACUTE ONLY): 28 min  Charges:  $Therapeutic Activity: 23-37 mins                     Magda Kiel, PT Acute Rehabilitation Services [phone] 01/22/2022    [name] 01/22/2022, 6:09 PM

## 2022-01-22 NOTE — Progress Notes (Signed)
[name] for Heparin Indication: atrial fibrillation  Allergies  Allergen Reactions   Celebrex [Celecoxib] Swelling   Keflet [Cephalexin] Swelling   Penicillins Hives and Swelling    DID THE REACTION INVOLVE: Swelling of the face/tongue/throat, SOB, or low BP? Yes-swelling-hives Sudden or severe rash/hives, skin peeling, or the inside of the mouth or nose? Unknown Did it require medical treatment? Unknown When did it last happen?    over 10 years   If all above answers are "NO", may proceed with cephalosporin use.    Sulfa Antibiotics Swelling   Kenalog [Triamcinolone Acetonide]     unknown   Latex    Lisinopril Other (See Comments)    unknown   Mobic [Meloxicam]     SWELLING    Patient Measurements: Height: '5\' 4"'$  (162.6 cm) Weight: 105.4 kg (232 lb 5.8 oz) IBW/kg (Calculated) : 54.7  Heparin Dosing Weight: 72 kg  Vital Signs: Temp: 97.6 F (36.4 C) (08/29 0323) Temp Source: Oral (08/29 0323) BP: 117/64 (08/29 0323) Pulse Rate: 71 (08/29 0323)  Labs: Recent Labs    01/20/22 0343 01/21/22 0645 01/22/22 0415 01/22/22 0429 01/22/22 0814  HGB 7.2* 7.3*  --  7.1*  --   HCT 23.1* 22.6*  --  22.2*  --   PLT 354 417*  --   --   --   HEPARINUNFRC 0.51 0.65 0.60  --   --   CREATININE 2.43* 2.73*  --   --  2.14*    Estimated Creatinine Clearance: 26.9 mL/min (A) (by C-G formula based on SCr of 2.14 mg/dL (H)).   Assessment: Pharmacy consulted to dose heparin infusion for post-op afib. Patient was not on any AC PTA.  She is noted s/p ex lap and on TPN.   Heparin level therapeutic at 0.60 on 1850 units/hr. Hgb 7.1 (slow trend down; fluid positive 30L total)  Goal of Therapy:  Heparin level 0.3-0.7 units/ml Monitor platelets by anticoagulation protocol: Yes   Plan:  Continue heparin infusion at 1850 units/hr Check daily heparin level and CBC  Hildred Laser, PharmD Clinical Pharmacist **Pharmacist phone directory can now  be found on [email or web address] ([email or web address]).  Listed under [place].

## 2022-01-22 NOTE — Progress Notes (Signed)
Speech Language Pathology Treatment: Dysphagia  Patient Details Name: [name] MRN: [id number] DOB: 11-04-1945 Today's Date: 01/22/2022 Time: 1010-1025 SLP Time Calculation (min) (ACUTE ONLY): 15 min  Assessment / Plan / Recommendation Clinical Impression  Patient seen by SLP for skilled treatment plan focused on dysphagia goals. Patient was sleeping when SLP arrived but awakened to voice easily. She asked SLP if the results of "the scan" were in, referring to CXR that was completed early this AM. She continues to report that she isn't eating or drinking too much and mainly has been having ice. Her SpO2 was in range of 92-95% and RR WFL. Her voice was clear strong. She continues with persistent non-productive cough with associated pain (grimacing after coughing). With trials of small sips of water, she also exhibited mildly delayed cough. Difficult to determine nature of patient's cough but SLP recommending to proceed with objective swallow study (FEES) to r/o aspiration. SLP described the test to patient and she verbalized agreement.  FEES will be scheduled next date.    HPI HPI: Patient is a 76 y.o. female with PMH: COPD, HLD, essential HTN, hypothyroidism, PE, osteoarthritis, depression, GERD. She presented initially to urgent care on 01/04/22 with acute onset abdominal pain, had CT which was unremarkable and she received IV fluids and antiemetics and was discharged hom. She presented to the hospital from home on 01/07/22 with vomiting and diarrhea since 01/04/22. In ED CT scan showed partial SBO. Surgery consulted and NG ordered. 8/15, had worsening renal function; 8/17 she went into shock and required emergent Exploratory laparotomy performed in OR and patient remained on vent on pressors post op. [name] on 8/19 and CRRT started; 8/21 she was extubated, TNA started and CRRT discontinued.      SLP Plan  Continue with current plan of care;Other (Comment) (FEES 01/23/22)       Recommendations for follow up therapy are one component of a multi-disciplinary discharge planning process, led by the attending physician.  Recommendations may be updated based on patient status, additional functional criteria and insurance authorization.    Recommendations  Diet recommendations: Thin liquid;Dysphagia 1 (puree) Liquids provided via: Cup;Straw Medication Administration: Other (Comment) Supervision: Full supervision/cueing for compensatory strategies;Staff to assist with self feeding Compensations: Small sips/bites;Slow rate Postural Changes and/or Swallow Maneuvers: Seated upright 90 degrees                Oral Care Recommendations: Staff/trained caregiver to provide oral care;Oral care BID;Oral care before and after PO Follow Up Recommendations: Follow physician's recommendations for discharge plan and follow up therapies Assistance recommended at discharge: Frequent or constant Supervision/Assistance SLP Visit Diagnosis: Dysphagia, unspecified (R13.10) Plan: Continue with current plan of care;Other (Comment) (FEES 01/23/22)         Sonia Baller, MA, CCC-SLP Speech Therapy

## 2022-01-22 NOTE — Progress Notes (Signed)
PROGRESS NOTE    [name]  EQA:[id number] DOB: 1946-03-18 DOA: 01/07/2022 PCP: Chevis Pretty, FNP   Brief Narrative: 76 year old with past medical history hyperlipidemia, hypertension, hypothyroidism, PE in the past, osteoarthritis, depression who presents from home with nausea vomiting and diarrhea since 8/11.  CT scan was obtained and was unremarkable.  She received IV fluids and antiemetic and was discharged home.  Her vomiting and diarrhea persisted, she presented to [facility], CT scan repeated found to have partial small bowel obstruction and significant changes compared to CT done on 8/11. Admitted 80/14 for bowel obstruction and was managed medically.  She developed acute renal failure, developed shock and underwent emergent exploratory laparotomy with lysis of adhesion on 8/17.  Intubated for Sx.  She was started on CRRT.  She was transferred to [facility] 8/19.  She was subsequently extubated 8/21st, started on TNA, CRRT discontinued.  She was started on hemodialysis first section 8/23. During hospitalization she also developed A-fib with RVR, started on amiodarone, cardiology consulted.  In hemodialysis 8/23  she was noted to be more lethargic, she was noted to have aberrantly conducted A-fib with PVCs, versus slow rate  V. Tach.  IV amiodarone was resumed.  Patient develops worsening abdominal pain, CT scan negative for abscess, no significant bowel obstruction. She was notice to have BL pleural effusion moderate. CCM consulted for evaluation for Thoracentesis. Underwent right side thoracentesis 8/25 yielding 1.1L pleural fluid. Plan to monitor clinically left side pleural effusion.  She develops diarrhea, C diff negative,. GI pathogen positive for E coli. Diarrhea improving.   Plan to continue to watch for renal recovery. Urine out put yesterday 500cc.  She has anemia of critical illness. Hb at 7.1 Plan to proceed with one unit PRBC and 40 mg IV lasix. Discussed with  nephrology.    Assessment & Plan:   Principal Problem:   Septic shock ([place]) Active Problems:   Respiratory failure requiring intubation (HCC)   Small bowel obstruction (HCC)   COPD (chronic obstructive pulmonary disease) (HCC)   Hyperlipidemia   Hypothyroidism (acquired)   Essential hypertension   History of pulmonary embolus (PE)   AKI (acute kidney injury) ([place])   Acute respiratory failure with hypoxia and hypercapnia (HCC)   Pressure injury of skin   Malnutrition of moderate degree   1-SBO status post laparotomy 8/17 -NG tube has been removed.  Patient started on TPN 8/21 -Treated course of aztreonam with metronidazole, antibiotics DC 8/27. -Surgery following -CT abdomen and pelvis 8/24: , due to abdominal pain and leukocytosis. Negative for abscess, decrease bowel distension small bowel loop.  -C diff negative, E coli positive stool.  -Diet advanced to Dysphagia 1 diet on 8/28. -Diarrhea improved, rectal tube removed.   2-Acute Metabolic Encephalopathy: She was more lethargic 8/23. ABG with hypoxia--- placed on oxygen CCM to evaluate, recommended support care.  Chest x ray: with pulmonary edema, PNA not excluded. CT head. Negative.  Multifactorial secondary to delirium, acute illness.  She is alert, and conversant.    3-A-fib with RVR, PVC Versus slow rate  V. tach On low dose metoprolol.  Appreciate cardiology assistance, ectopy during last HD related to electrolytes abnormalities.  On oral amiodarone.  Cardiology sign off.  On amiodarone taper dose.   4-Acute hypoxic respiratory failure;  Pulmonary edema BL Pleural effusion: ABG Po2 58 Placed on oxygen Chest x ray : pulmonary edema vs infection.  Antibiotics has been discontinue.  Volume manage with HD>  CT abdomen pelvis showed: Moderate to  large bilateral pleural effusions. Underwent right side thoracentesis 8/25 yielding 1.1 L.  Pleural fluid not send for cell count.  Chest x ray 8/29: Decreased size  of left pleural effusion.  Cardiomegaly and mild edema.  CCM recommended to continue to monitor clinically, no plan for thoracentesis unless symptomatic.  AKI likely from ATN from sepsis Treated with CRRT and discontinue 80/21 Nephrology  following and initiating intermittent hemodialysis She has been tolerating HD> Had HD 8/28 Urine out put yesterday 500 cc Continue to monitor for renal recovery.   Diarrhea;  Gastroenteritis, E coli,.  -Support care.  -ID didn't recommend antibiotics tx.   Moderate protein malnutrition: Continue with TPN  Acute on Chronic anemia: Anemia of acute illness Hb  down to 7.1.  With her cardiac history we will proceed with 1 unit of packed red blood cell.  Discussed with renal okay to proceed with transfusion and give 40 mg of IV Lasix. Plan to pause TPN while getting blood transfusion.   Hypokalemia, hypophosphatemia,: Resolved.  Further correction during HD>   Leukocytosis: Chest x ray PNA vs edema.  CT abdomen pelvis. Negative for abscess.  GI pathogen positive for E coli/  C diff negative.    See wound care documentation below  Pressure Injury 01/10/22 Coccyx Medial;Mid Stage 2 -  Partial thickness loss of dermis presenting as a shallow open injury with a red, pink wound bed without slough. pink (Active)  01/10/22 1200  Location: Coccyx  Location Orientation: Medial;Mid  Staging: Stage 2 -  Partial thickness loss of dermis presenting as a shallow open injury with a red, pink wound bed without slough.  Wound Description (Comments): pink  Present on Admission:   Dressing Type None 01/21/22 2100     Pressure Injury 01/12/22 Face Left Stage 2 -  Partial thickness loss of dermis presenting as a shallow open injury with a red, pink wound bed without slough. red ulceration beneath ETT holder on L cheek (Active)  01/12/22 1815  Location: Face  Location Orientation: Left  Staging: Stage 2 -  Partial thickness loss of dermis presenting as a shallow  open injury with a red, pink wound bed without slough.  Wound Description (Comments): red ulceration beneath ETT holder on L cheek  Present on Admission: Yes  Dressing Type None 01/20/22 2130     Pressure Injury 01/16/22 Lumbar Left (Active)  01/16/22 1230  Location: Lumbar  Location Orientation: Left  Staging:   Wound Description (Comments):   Present on Admission: Yes  Dressing Type None 01/19/22 2000     Pressure Injury 01/16/22 Buttocks Left (Active)  01/16/22 1230  Location: Buttocks  Location Orientation: Left  Staging:   Wound Description (Comments):   Present on Admission: Yes  Dressing Type None 01/19/22 2000     Nutrition Problem: Moderate Malnutrition Etiology: acute illness (SBO)    Signs/Symptoms: energy intake < 75% for > 7 days, mild muscle depletion, moderate fat depletion    Interventions: Nepro shake  Estimated body mass index is 39.89 kg/m as calculated from the following:   Height as of this encounter: '5\' 4"'$  (1.626 m).   Weight as of this encounter: 105.4 kg.   DVT prophylaxis: Heparin drip Code Status: Full code Family Communication: Son at bedside updated 8/29 Disposition Plan:  Status is: Inpatient Remains inpatient appropriate because: Remain for management of SBO, status postsurgery, A-fib, renal failure    Consultants:  Nephrology CCM Surgery  Procedures:    Antimicrobials:    Subjective: She is  alert, report mild abdominal pain. She report productive cough, thick phlegm.   Objective: Vitals:   01/21/22 2120 01/22/22 0323 01/22/22 1146 01/22/22 1149  BP: 109/60 117/64 109/72 109/72  Pulse: 66 71 63 78  Resp: (!) '21 16  15  '$ Temp: 97.7 F (36.5 C) 97.6 F (36.4 C)    TempSrc: Oral Oral    SpO2: 96% 95%  98%  Weight:  105.4 kg    Height:        Intake/Output Summary (Last 24 hours) at 01/22/2022 1507 Last data filed at 01/21/2022 2100 Gross per 24 hour  Intake 568.05 ml  Output 350 ml  Net 218.05 ml    Filed  Weights   01/21/22 0656 01/21/22 1207 01/22/22 0323  Weight: 110 kg 107.5 kg 105.4 kg    Examination:  General exam: NAD Respiratory system: BL air movement.  Cardiovascular system: S,1  S 2 RRR Gastrointestinal system:BS present, soft, nt staple  in place.  Central nervous system: alert, follows command Extremities: plus 2 edema \  Data Reviewed: I have personally reviewed following labs and imaging studies  CBC: Recent Labs  Lab 01/17/22 0536 01/18/22 0707 01/19/22 0730 01/20/22 0343 01/21/22 0645 01/22/22 0429  WBC 20.2* 19.1* 17.2* 14.3* 14.3*  --   HGB 8.3* 7.9* 7.4* 7.2* 7.3* 7.1*  HCT 25.8* 24.7* 22.9* 23.1* 22.6* 22.2*  MCV 88.7 88.8 87.7 89.5 87.6  --   PLT 218 259 306 354 417*  --     Basic Metabolic Panel: Recent Labs  Lab 01/18/22 0707 01/18/22 1145 01/19/22 0515 01/20/22 0343 01/21/22 0645 01/22/22 0415 01/22/22 0814  NA 126* 130* 129* 129* 131*  --  134*  K 3.1* 3.3* 3.6 3.9 3.7  --  3.8  CL 102 100 96* 97* 96*  --  99  CO2 21* '25 24 25 26  '$ --  28  GLUCOSE 112* 145* 121* 110* 106*  --  145*  BUN 54* 39* 47* 60* 77*  --  59*  CREATININE 2.57* 1.92* 2.19* 2.43* 2.73*  --  2.14*  CALCIUM 7.1* 7.1* 7.4* 7.5* 7.7*  --  7.8*  MG 1.8  --  1.8 2.0 2.1 1.9  --   PHOS 3.2  --  3.0 3.8 4.4  --  3.7    GFR: Estimated Creatinine Clearance: 26.9 mL/min (A) (by C-G formula based on SCr of 2.14 mg/dL (H)). Liver Function Tests: Recent Labs  Lab 01/16/22 0627 01/21/22 0645 01/22/22 0814  AST 25 16  --   ALT 17 16  --   ALKPHOS 128* 93  --   BILITOT 0.6 0.3  --   PROT 4.1* 4.7*  --   ALBUMIN 1.8* 1.7* 1.7*    No results for input(s): "LIPASE", "AMYLASE" in the last 168 hours. No results for input(s): "AMMONIA" in the last 168 hours. Coagulation Profile: No results for input(s): "INR", "PROTIME" in the last 168 hours. Cardiac Enzymes: No results for input(s): "CKTOTAL", "CKMB", "CKMBINDEX", "TROPONINI" in the last 168 hours. BNP (last 3  results) No results for input(s): "PROBNP" in the last 8760 hours. HbA1C: No results for input(s): "HGBA1C" in the last 72 hours. CBG: Recent Labs  Lab 01/20/22 2352 01/21/22 1251 01/21/22 1829 01/22/22 0146 01/22/22 1404  GLUCAP 99 126* 112* 118* 116*    Lipid Profile: Recent Labs    01/21/22 0645  TRIG 18    Thyroid Function Tests: No results for input(s): "TSH", "T4TOTAL", "FREET4", "T3FREE", "THYROIDAB" in the last 72 hours.  Anemia Panel: No results for input(s): "VITAMINB12", "FOLATE", "FERRITIN", "TIBC", "IRON", "RETICCTPCT" in the last 72 hours. Sepsis Labs: No results for input(s): "PROCALCITON", "LATICACIDVEN" in the last 168 hours.   Recent Results (from the past 240 hour(s))  Culture, Respiratory w Gram Stain     Status: None   Collection Time: 01/12/22  6:14 PM   Specimen: Tracheal Aspirate; Respiratory  Result Value Ref Range Status   Specimen Description TRACHEAL ASPIRATE  Final   Special Requests NONE  Final   Gram Stain NO WBC SEEN NO ORGANISMS SEEN   Final   Culture   Final    RARE Normal respiratory flora-no Staph aureus or Pseudomonas seen Performed at [facility], [address], [place], [place] [address]    Report Status 01/14/2022 FINAL  Final  MRSA Next Gen by PCR, Nasal     Status: None   Collection Time: 01/12/22  6:50 PM   Specimen: Nasal Mucosa; Nasal Swab  Result Value Ref Range Status   MRSA by PCR Next Gen NOT DETECTED NOT DETECTED Final    Comment: (NOTE) The GeneXpert MRSA Assay (FDA approved for NASAL specimens only), is one component of a comprehensive MRSA colonization surveillance program. It is not intended to diagnose MRSA infection nor to guide or monitor treatment for MRSA infections. Test performance is not FDA approved in patients less than 49 years old. Performed at [facility], [place] [address], [place], [place] [address]   Gastrointestinal Panel by PCR , Stool     Status: Abnormal   Collection Time:  01/18/22  3:42 PM   Specimen: Stool  Result Value Ref Range Status   Campylobacter species NOT DETECTED NOT DETECTED Final   Plesimonas shigelloides NOT DETECTED NOT DETECTED Final   Salmonella species NOT DETECTED NOT DETECTED Final   Yersinia enterocolitica NOT DETECTED NOT DETECTED Final   Vibrio species NOT DETECTED NOT DETECTED Final   Vibrio cholerae NOT DETECTED NOT DETECTED Final   Enteroaggregative E coli (EAEC) NOT DETECTED NOT DETECTED Final   Enterotoxigenic E coli (ETEC) NOT DETECTED NOT DETECTED Final   Shiga like toxin producing E coli (STEC) DETECTED (A) NOT DETECTED Final    Comment: RESULT CALLED TO, READ BACK BY AND VERIFIED WITH: NAA DORMON 01/20/22 1417 AMK    E. coli O157 NOT DETECTED NOT DETECTED Final   Shigella/Enteroinvasive E coli (EIEC) NOT DETECTED NOT DETECTED Final   Cryptosporidium NOT DETECTED NOT DETECTED Final   Cyclospora cayetanensis NOT DETECTED NOT DETECTED Final   Entamoeba histolytica NOT DETECTED NOT DETECTED Final   Giardia lamblia NOT DETECTED NOT DETECTED Final   Adenovirus F40/41 NOT DETECTED NOT DETECTED Final   Astrovirus NOT DETECTED NOT DETECTED Final   Norovirus GI/GII NOT DETECTED NOT DETECTED Final   Rotavirus A NOT DETECTED NOT DETECTED Final   Sapovirus (I, II, IV, and V) NOT DETECTED NOT DETECTED Final    Comment: Performed at [facility], [address] [place], [place] [address]  C Difficile Quick Screen (NO PCR Reflex)     Status: None   Collection Time: 01/19/22  6:32 PM   Specimen: STOOL  Result Value Ref Range Status   C Diff antigen NEGATIVE NEGATIVE Final   C Diff toxin NEGATIVE NEGATIVE Final   C Diff interpretation No C. difficile detected.  Final    Comment: Performed at [facility], [place] [address] [place], [place] [address]         Radiology  Studies: DG Chest 2 View  Result Date: 01/22/2022 CLINICAL DATA:  Follow-up pleural effusion EXAM: CHEST - 2 VIEW COMPARISON:  Radiographs  01/20/2022 FINDINGS: Decreased size of the layering left pleural effusion since 01/20/2022. Remainder not substantially changed. Left-greater-than-right basilar airspace opacities. Bilateral interstitial opacities. No pneumothorax. Cardiomegaly. Aortic calcification. Right CVC tip in the right atrium.  Left IJ CVC tip in the low SVC. IMPRESSION: 1. Decreased size of the left pleural effusion. 2. Cardiomegaly and mild edema. Electronically Signed   By: Placido Sou M.D.   On: 01/22/2022 08:02        Scheduled Meds:  sodium chloride   Intravenous Once   amiodarone  400 mg Oral BID   Followed by   Derrill Memo ON 01/23/2022] amiodarone  400 mg Oral Daily   Followed by   Derrill Memo ON 01/27/2022] amiodarone  200 mg Oral Daily   Chlorhexidine Gluconate Cloth  6 each Topical Q0600   escitalopram  10 mg Oral Daily   feeding supplement (NEPRO CARB STEADY)  237 mL Oral BID BM   ferrous sulfate  325 mg Oral Q breakfast   folic acid  1 mg Oral Daily   furosemide  40 mg Intravenous Once   guaiFENesin  600 mg Oral BID   metoprolol tartrate  12.5 mg Oral BID   sodium chloride flush  10-40 mL Intracatheter Q12H   sodium chloride flush  3 mL Intravenous Q12H   Continuous Infusions:  sodium chloride Stopped (01/16/22 1822)   sodium chloride Stopped (01/15/22 1255)   heparin 1,850 Units/hr (01/21/22 2320)   TPN ADULT (ION) 65 mL/hr at 96/28/36 6294   TPN CYCLIC-ADULT (ION)       LOS: 15 days    Time spent: 35 minutes    Margreat Widener A Kemonie Cutillo, MD [facility]   If 7PM-7AM, please contact night-coverage [email or web address]  01/22/2022, 3:07 PM

## 2022-01-22 NOTE — Progress Notes (Signed)
[facility] PCCM  Left pleural effusion decreasing in size No indication for thoracentesis at this time PCCM available PRN  Roselie Awkward, MD [place] PCCM Pager: [phone] Cell: [phone] After 7:00 pm call Elink  [phone]

## 2022-01-22 NOTE — Progress Notes (Signed)
PHARMACY - TOTAL PARENTERAL NUTRITION CONSULT NOTE   Indication: Prolonged ileus  Patient Measurements: Height: '5\' 4"'$  (162.6 cm) Weight: 105.4 kg (232 lb 5.8 oz) IBW/kg (Calculated) : 54.7 TPN AdjBW (KG): 60.7 Body mass index is 39.89 kg/m.  Assessment: 76 yo female with septic shock, high-grade small bowel obstruction. Underwent surgery on 8/17 with ex lap and lysis of adhesions for SBO. Reported vomiting and diarrhea since 8/11 up until surgery on 8/17. Pharmacy consulted for TPN due to prolonged ileus.  Glucose / Insulin: no hx DM - CBGs < 130, off SSI Electrolytes: Na 131, Cl 96, CoCa 9.54, phos 4.4 up  Renal: 8/19-8/21 CRRT >> 8/23 HD, last 8/28 w/ 4 mEq K bath; BUN 77  Hepatic: AST / ALT / tbili wnl, TG 18,  albumin 1.7 Intake / Output; MIVF: UOP 500 mL charted, stool 236m GI Imaging:  8/16 KUB: high grade bowel obstruction  8/24 CT abd: decr gas distention, anasarca throughout abd wall, distention of lower abdominal/pelvic small bowel loops  8/24 KUB: persistent SBO vs. post-op ileus, suspected ascites   GI Surgeries / Procedures:  8/17 with ex lap and LOA for SBO  Central access: triple lumen CVC placed 01/10/22 TPN start date: 01/14/22  Nutritional Goals:  RD Estimated Needs Total Energy Estimated Needs: 1700-1900 Total Protein Estimated Needs: 100-120 gm Total Fluid Estimated Needs: 1L + UOP  Current Nutrition:  TPN 8/27 CLD 8/28 DYS 1    Plan:  Cycle concentrated TPN at goal (rate 43-85 ml/hr, GIR 1.1-2.2 mg/kg/min) provides 109g AA and 1784 Kcal, meeting 100% estimated needs Electrolytes in TPN: Increase Cl:Acet 1:1; Decrease Phos 2 mmol/L; Continue Na 150 mEq/L (Max), K 16 mEq/L, Ca 5 mEq/L, Mg 7 mEq/L     Add standard MVI and trace elements to TPN - no chromium since on iHD Monitor TPN labs on Mon/Thurs F/u PO intake/diet advancement  Thank you for involving pharmacy in this patient's care.  LBenetta Spar PharmD, BCPS, BCCP Clinical Pharmacist  Please  check AMION for all MNavarrophone numbers After 10:00 PM, call [phone]

## 2022-01-22 NOTE — Progress Notes (Signed)
Nutrition Follow-up  DOCUMENTATION CODES:   Non-severe (moderate) malnutrition in context of acute illness/injury  INTERVENTION:  TPN management per pharmacy Meal ordering with assistance Discontinue Boost Breeze Nepro Shake po BID, each supplement provides 425 kcal and 19 grams protein  NUTRITION DIAGNOSIS:   Moderate Malnutrition related to acute illness (SBO) as evidenced by energy intake < 75% for > 7 days, mild muscle depletion, moderate fat depletion.  Ongoing  GOAL:   Patient will meet greater than or equal to 90% of their needs  Goal met via TPN  MONITOR:   I & O's, Labs, Skin, Diet advancement  REASON FOR ASSESSMENT:   Consult New TPN/TNA  ASSESSMENT:   76 yo female with septic shock, high-grade small bowel obstruction. 8/17- status post exploratory laparotomy with lysis of adhesions. Acute kidney injury. Per Nephrology-pt anuric and is scheduled for transfer to [facility] for CRRT. 8/17-Right IJ.  8/17: s/p ex-lap, lysis of adhesion 8/21: CRRT d/c; TPN started 8/23: first iHD  8/25: diet advanced to clear liquid; s/p thoracentesis-yield 1.1L 8/28: diet advanced to dysphagia 1  SLP following. Recommended upgrade diet to dysphagia 1 and plans for MBS/FEES once nausea subsides.   TPN transitioned to cyclic today:  Cycle concentrated TPN at goal (rate 43-85 ml/hr, GIR 1.1-2.2 mg/kg/min) provides 109g AA and 1784 Kcal, meeting 100% estimated needs  Spoke with pt via phone call to room. She reports that she doesn't feel much different or better today. She did not provide many details as to how she is eating but states that she is still having some difficulty with swallowing. She is uncertain if she has received or tried boost breeze but is agreeable to trying Nepro shakes now that she is no longer on a clear liquid diet.   Reviewed [facility] meal ordering system. It looks as though she did not receive breakfast or lunch today. Adjusted to order with assistance for  ease of placing meal order.  Medications reviewed  Labs: sodium 134, BUN 59, Cr 2.14, GFR 24, TG's 18, CBG's 118/112/126 x24 hours  UOP: 597m x24 hours  Post HD net UF: 2.5L  I/O's: +30.6L since 8/15  Diet Order:   Diet Order             DIET - DYS 1 Room service appropriate? Yes; Fluid consistency: Thin  Diet effective now                   EDUCATION NEEDS:   Not appropriate for education at this time  Skin:  Skin Assessment: Skin Integrity Issues: Skin Integrity Issues:: Other (Comment) Stage II: coccyx, L side of face Other: PI lumbar, L buttock  Last BM:  8/28 (type 7-small)  Height:   Ht Readings from Last 1 Encounters:  01/12/22 _0  (1.626 m)    Weight:   Wt Readings from Last 1 Encounters:  01/22/22 105.4 kg    Ideal Body Weight:  54.5 kg  BMI:  Body mass index is 39.89 kg/m.  Estimated Nutritional Needs:   Kcal:  1700-1900  Protein:  100-120 gm  Fluid:  1L + UOP  AClayborne Dana RDN, LDN Clinical Nutrition

## 2022-01-22 NOTE — TOC Progression Note (Signed)
Transition of Care [facility]) - Progression Note    Patient Details  Name: [name] MRN: [id number] Date of Birth: 23-Mar-1946  Transition of [facility]) CM/SW [place], [place] Phone Number: 01/22/2022, 4:12 PM  Clinical Narrative:     CSW following to fax patient out for SNF closer to patient being medically ready for dc. Patient currently on TPN. CSW will continue to follow and assist with patients dc planning needs.  Expected Discharge Plan: Home/Self Care Barriers to Discharge: Continued Medical Work up  Expected Discharge Plan and Services Expected Discharge Plan: Home/Self Care                                               Social Determinants of Health (SDOH) Interventions    Readmission Risk Interventions     No data to display

## 2022-01-23 ENCOUNTER — Telehealth: Payer: Self-pay | Admitting: *Deleted

## 2022-01-23 DIAGNOSIS — E039 Hypothyroidism, unspecified: Secondary | ICD-10-CM

## 2022-01-23 DIAGNOSIS — K56609 Unspecified intestinal obstruction, unspecified as to partial versus complete obstruction: Secondary | ICD-10-CM | POA: Diagnosis not present

## 2022-01-23 DIAGNOSIS — I10 Essential (primary) hypertension: Secondary | ICD-10-CM

## 2022-01-23 DIAGNOSIS — E7800 Pure hypercholesterolemia, unspecified: Secondary | ICD-10-CM

## 2022-01-23 DIAGNOSIS — J9690 Respiratory failure, unspecified, unspecified whether with hypoxia or hypercapnia: Secondary | ICD-10-CM | POA: Diagnosis not present

## 2022-01-23 DIAGNOSIS — J449 Chronic obstructive pulmonary disease, unspecified: Secondary | ICD-10-CM

## 2022-01-23 DIAGNOSIS — E440 Moderate protein-calorie malnutrition: Secondary | ICD-10-CM

## 2022-01-23 DIAGNOSIS — A419 Sepsis, unspecified organism: Secondary | ICD-10-CM | POA: Diagnosis not present

## 2022-01-23 LAB — CBC
HCT: 22.8 % — ABNORMAL LOW (ref 36.0–46.0)
Hemoglobin: 7.4 g/dL — ABNORMAL LOW (ref 12.0–15.0)
MCH: 28.7 pg (ref 26.0–34.0)
MCHC: 32.5 g/dL (ref 30.0–36.0)
MCV: 88.4 fL (ref 80.0–100.0)
Platelets: 454 10*3/uL — ABNORMAL HIGH (ref 150–400)
RBC: 2.58 MIL/uL — ABNORMAL LOW (ref 3.87–5.11)
RDW: 17.6 % — ABNORMAL HIGH (ref 11.5–15.5)
WBC: 12.1 10*3/uL — ABNORMAL HIGH (ref 4.0–10.5)
nRBC: 0 % (ref 0.0–0.2)

## 2022-01-23 LAB — TYPE AND SCREEN
ABO/RH(D): O POS
Antibody Screen: NEGATIVE
Unit division: 0

## 2022-01-23 LAB — GLUCOSE, CAPILLARY
Glucose-Capillary: 100 mg/dL — ABNORMAL HIGH (ref 70–99)
Glucose-Capillary: 101 mg/dL — ABNORMAL HIGH (ref 70–99)
Glucose-Capillary: 108 mg/dL — ABNORMAL HIGH (ref 70–99)
Glucose-Capillary: 114 mg/dL — ABNORMAL HIGH (ref 70–99)

## 2022-01-23 LAB — RENAL FUNCTION PANEL
Albumin: 1.7 g/dL — ABNORMAL LOW (ref 3.5–5.0)
Anion gap: 9 (ref 5–15)
BUN: 69 mg/dL — ABNORMAL HIGH (ref 8–23)
CO2: 26 mmol/L (ref 22–32)
Calcium: 7.9 mg/dL — ABNORMAL LOW (ref 8.9–10.3)
Chloride: 99 mmol/L (ref 98–111)
Creatinine, Ser: 2.48 mg/dL — ABNORMAL HIGH (ref 0.44–1.00)
GFR, Estimated: 20 mL/min — ABNORMAL LOW (ref >60)
Glucose, Bld: 119 mg/dL — ABNORMAL HIGH (ref 70–99)
Phosphorus: 3.6 mg/dL (ref 2.5–4.6)
Potassium: 3.7 mmol/L (ref 3.5–5.1)
Sodium: 134 mmol/L — ABNORMAL LOW (ref 135–145)

## 2022-01-23 LAB — BPAM RBC
Blood Product Expiration Date: 202310012359
ISSUE DATE / TIME: 202308291724
Unit Type and Rh: 5100

## 2022-01-23 LAB — MAGNESIUM: Magnesium: 2.1 mg/dL (ref 1.7–2.4)

## 2022-01-23 LAB — HEPARIN LEVEL (UNFRACTIONATED): Heparin Unfractionated: 0.54 IU/mL (ref 0.30–0.70)

## 2022-01-23 MED ORDER — TRACE MINERALS CU-MN-SE-ZN 300-55-60-3000 MCG/ML IV SOLN
INTRAVENOUS | Status: DC
  Filled 2022-01-23: qty 725

## 2022-01-23 MED ORDER — TRACE MINERALS CU-MN-SE-ZN 300-55-60-3000 MCG/ML IV SOLN
INTRAVENOUS | Status: AC
  Filled 2022-01-23: qty 390

## 2022-01-23 NOTE — Progress Notes (Signed)
[place] for Heparin Indication: atrial fibrillation  Allergies  Allergen Reactions   Celebrex [Celecoxib] Swelling   Keflet [Cephalexin] Swelling   Penicillins Hives and Swelling    DID THE REACTION INVOLVE: Swelling of the face/tongue/throat, SOB, or low BP? Yes-swelling-hives Sudden or severe rash/hives, skin peeling, or the inside of the mouth or nose? Unknown Did it require medical treatment? Unknown When did it last happen?    over 10 years   If all above answers are "NO", may proceed with cephalosporin use.    Sulfa Antibiotics Swelling   Kenalog [Triamcinolone Acetonide]     unknown   Latex    Lisinopril Other (See Comments)    unknown   Mobic [Meloxicam]     SWELLING    Patient Measurements: Height: '5\' 4"'$  (162.6 cm) Weight: 109 kg (240 lb 6.4 oz) IBW/kg (Calculated) : 54.7  Heparin Dosing Weight: 72 kg  Vital Signs: Temp: 97.9 F (36.6 C) (08/30 0833) Temp Source: Oral (08/30 0833) BP: 110/69 (08/30 1021) Pulse Rate: 80 (08/30 1021)  Labs: Recent Labs    01/21/22 0645 01/22/22 0415 01/22/22 0429 01/22/22 0814 01/23/22 0637 01/23/22 0731  HGB 7.3*  --  7.1*  --  7.4*  --   HCT 22.6*  --  22.2*  --  22.8*  --   PLT 417*  --   --   --  454*  --   HEPARINUNFRC 0.65 0.60  --   --   --  0.54  CREATININE 2.73*  --   --  2.14* 2.48*  --     Estimated Creatinine Clearance: 23.6 mL/min (A) (by C-G formula based on SCr of 2.48 mg/dL (H)).   Assessment: Pharmacy consulted to dose heparin infusion for post-op afib. Patient was not on any AC PTA.  She is noted s/p ex lap and on TPN.   Heparin level therapeutic at 0.60 on 1850 units/hr. Hgb 7.4 (slow trend down; fluid positive ~30L total)  Goal of Therapy:  Heparin level 0.3-0.7 units/ml Monitor platelets by anticoagulation protocol: Yes   Plan:  Continue heparin infusion at 1850 units/hr Check daily heparin level and CBC  Hildred Laser, PharmD Clinical  Pharmacist **Pharmacist phone directory can now be found on [email or web address] ([email or web address]).  Listed under [place].

## 2022-01-23 NOTE — Telephone Encounter (Signed)
Received fax from [employer] for FMLA forms [phone]~ telephone/ [phone]~ fax.  Patient son, [name] contacted for further information: Investment banker, corporate for [employer], 8:30am- 5:30pm  Requesting FMLA for care of mother starting with ER visit at [place] on 01/04/2022- present. States that patient will be discharged to SNF for rehab after release from hospital.  Surgical Date: 01/10/2022  Procedure: 44005- Exploratory laparotomy lysis of adhesion  Dx: C09.794- SBO, A41.9, R65.21- Septic Shock  Requested Beginning Date: 01/04/2022 Return to Work Date: 8 weeks- 03/04/2022  Routed to [facility] specialist.

## 2022-01-23 NOTE — Progress Notes (Addendum)
[facility] PROGRESS NOTE  Assessment/ Plan: Pt is a 76 y.o. yo female with history of HTN, HLD, hypothyroidism, PE, presented with SBO underwent emergent ex lap with lysis of adhesion on 8/17, developed anterior dependent respiratory failure, AKI requiring CRRT.  #Acute kidney injury, oligoanuric, dialysis dependent: Likely ischemic ATN from shock/sepsis.  She was on ARB prior to admission.  Initially required CRRT which was stopped on 8/21 and initiated IHD on 8/23.  Seen by palliative care team. Last HD on 8/28. The patient is incontinent and having diarrhea.  The urine output is not measured.  Noted BUN/creatinine level mildly gone up however the patient does not have any overt uremic symptoms.  She is in room air.  I will hold dialysis today and watch for renal recovery.  Repeat lab in the morning.  Unable to use PureWick or Foley catheter because of patient having diarrhea per nursing staff.  Unfortunately we will not able to get a strict ins and out.  #Septic shock, off of pressors.  Blood pressure improving.  #High-grade small bowel obstruction now status post ex lap and lysis of adhesion.  On TPN.  General surgery team is following.  #Hyponatremia, hypervolemic: Recommend to minimize free water.  #Anemia of critical illness: Received a unit of blood transfusion on 8/29.  I will check iron studies.  Transfuse as needed.  Subjective: Seen and examined.  Reportedly she is having diarrhea and being incontinent.  Unable to measure urine output.  Discussed with the nursing staff.  Patient is in room air.  She denies nausea, vomiting, chest pain or shortness of breath.  Objective Vital signs in last 24 hours: Vitals:   01/22/22 2014 01/23/22 0415 01/23/22 0833 01/23/22 1021  BP: 116/79 129/73 128/73 110/69  Pulse: 70 71 74 80  Resp: '17 18 18 18  '$ Temp: 98.2 F (36.8 C) 97.9 F (36.6 C) 97.9 F (36.6 C)   TempSrc: Oral Oral Oral   SpO2: 94% 95% 95% 90%   Weight:  109 kg    Height:       Weight change: 1.545 kg  Intake/Output Summary (Last 24 hours) at 01/23/2022 1129 Last data filed at 01/23/2022 0438 Gross per 24 hour  Intake 1281.27 ml  Output --  Net 1281.27 ml        Labs: RENAL PANEL Recent Labs    01/12/22 2123 01/13/22 0448 01/13/22 1521 01/14/22 0400 01/14/22 1615 01/15/22 0431 01/16/22 0627 01/17/22 0115 01/17/22 0536 01/17/22 1100 01/18/22 0707 01/18/22 1145 01/19/22 0515 01/20/22 0343 01/21/22 0645 01/22/22 0415 01/22/22 0814 01/23/22 0637  NA 135 137 137 138 136 138 134* 132*  --  131* 126* 130* 129* 129* 131*  --  134* 134*  K 3.4* 3.5 3.9 3.7 3.6 3.3* 3.3* 2.6*  --  3.1* 3.1* 3.3* 3.6 3.9 3.7  --  3.8 3.7  CL 111 110 108 109 107 107 106 102  --  102 102 100 96* 97* 96*  --  99 99  CO2 14* 16* 20* 20* '24 24 22 22  '$ --  22 21* '25 24 25 26  '$ --  28 26  GLUCOSE 113* 124* 157* 158* 125* 98 113* 112*  --  120* 112* 145* 121* 110* 106*  --  145* 119*  BUN 86* 61* 43* 31* 23 28* 43* 34*  --  41* 54* 39* 47* 60* 77*  --  59* 69*  CREATININE 4.83* 3.55* 2.56* 2.08* 1.64* 2.09* 2.75* 2.08*  --  2.33* 2.57* 1.92* 2.19* 2.43* 2.73*  --  2.14* 2.48*  CALCIUM 7.6* 7.7* 7.5* 7.4* 7.3* 7.4* 7.7* 7.1*  --  7.3* 7.1* 7.1* 7.4* 7.5* 7.7*  --  7.8* 7.9*  MG  --  2.3  --  2.4  --  2.1 2.1 1.5*  --  1.9 1.8  --  1.8 2.0 2.1 1.9  --  2.1  PHOS 3.4 2.6 2.3* 1.9* 1.6* 2.0* 1.4* 2.6 2.5  --  3.2  --  3.0 3.8 4.4  --  3.7 3.6  ALBUMIN 1.8* 1.9* 1.9* 1.9* 1.8* 1.8* 1.8*  --   --   --   --   --   --   --  1.7*  --  1.7* 1.7*      Liver Function Tests: Recent Labs  Lab 01/21/22 0645 01/22/22 0814 01/23/22 0637  AST 16  --   --   ALT 16  --   --   ALKPHOS 93  --   --   BILITOT 0.3  --   --   PROT 4.7*  --   --   ALBUMIN 1.7* 1.7* 1.7*    No results for input(s): "LIPASE", "AMYLASE" in the last 168 hours. No results for input(s): "AMMONIA" in the last 168 hours. CBC: Recent Labs    01/19/22 0730 01/20/22 0343  01/21/22 0645 01/22/22 0429 01/23/22 0637  HGB 7.4* 7.2* 7.3* 7.1* 7.4*  MCV 87.7 89.5 87.6  --  88.4     Cardiac Enzymes: No results for input(s): "CKTOTAL", "CKMB", "CKMBINDEX", "TROPONINI" in the last 168 hours. CBG: Recent Labs  Lab 01/21/22 1829 01/22/22 0146 01/22/22 1404 01/23/22 0000 01/23/22 0627  GLUCAP 112* 118* 116* 100* 114*     Iron Studies: No results for input(s): "IRON", "TIBC", "TRANSFERRIN", "FERRITIN" in the last 72 hours. Studies/Results: DG Chest 2 View  Result Date: 01/22/2022 CLINICAL DATA:  Follow-up pleural effusion EXAM: CHEST - 2 VIEW COMPARISON:  Radiographs 01/20/2022 FINDINGS: Decreased size of the layering left pleural effusion since 01/20/2022. Remainder not substantially changed. Left-greater-than-right basilar airspace opacities. Bilateral interstitial opacities. No pneumothorax. Cardiomegaly. Aortic calcification. Right CVC tip in the right atrium.  Left IJ CVC tip in the low SVC. IMPRESSION: 1. Decreased size of the left pleural effusion. 2. Cardiomegaly and mild edema. Electronically Signed   By: Placido Sou M.D.   On: 01/22/2022 08:02    Medications: Infusions:  sodium chloride Stopped (01/16/22 1822)   sodium chloride Stopped (01/15/22 1255)   heparin 1,850 Units/hr (16/07/37 1062)   TPN CYCLIC-ADULT (ION) 45 mL/hr at 69/48/54 6270   TPN CYCLIC-ADULT (ION)      Scheduled Medications:  amiodarone  400 mg Oral Daily   Followed by   Derrill Memo ON 01/27/2022] amiodarone  200 mg Oral Daily   Chlorhexidine Gluconate Cloth  6 each Topical Q0600   escitalopram  10 mg Oral Daily   feeding supplement (NEPRO CARB STEADY)  237 mL Oral BID BM   ferrous sulfate  325 mg Oral Q breakfast   folic acid  1 mg Oral Daily   guaiFENesin  600 mg Oral BID   metoprolol tartrate  12.5 mg Oral BID   sodium chloride flush  10-40 mL Intracatheter Q12H   sodium chloride flush  3 mL Intravenous Q12H    have reviewed scheduled and prn medications.  Physical  Exam: General: Lying on bed comfortable, not in distress Heart:RRR, s1s2 nl Lungs:clear b/l, no crackle Abdomen:soft, Non-tender, non-distended Extremities: Upper extremities edema  noted, trace leg edema + Dialysis Access: Left IJ temporary HD catheter in place  [name] [name] [name] 01/23/2022,11:29 AM  LOS: 16 days

## 2022-01-23 NOTE — Care Management (Signed)
1352 01-23-22 Case Manager reached out to MD to see if he was agreeable for Case Manager to discuss LTAC with family. MD is agreeable that patient could be a good candidate. Case Manager spoke with son Larkin Ina regarding LTAC and what they offer. Son is not agreeable to LTAC at this time. Son states the location is too far away from the home address and the family wants the patient closer to [facility] in a [place]. Family wants SNF once patient is medically stable to transition from the hospital.

## 2022-01-23 NOTE — Progress Notes (Signed)
Occupational Therapy Treatment Patient Details Name: [name] MRN: [id number] DOB: 11-28-1945 Today's Date: 01/23/2022   History of present illness 76 yo female admitted 8/14 to [facility] with vomiting and diarrhea with partial SBO. 8/17 intubated and ex lap. 8/19 pt with worsening renal function, transfer to [facility] and CRRT started. 8/21 extubation with CRRT off. PMHx: COPD, HLD, HTN, PE, hypothyroidism, OA and depression   OT comments  Patient continues to make steady progress towards goals in skilled OT session. Patient's session encompassed  co-treat with PT in order to progress with functional mobility with two sets of skilled hands. PT and OT utilizing Stedy in session to assist with sit<>stands, completing 3x in session. Patient remains max-total A of 2 to initiate stand, unable to come fully into standing and bracing BLEs against bed and BUES on arm flaps in session, assist provided at glutes by PT and OT to attempt to activate to pull into standing (minimal initiation noted). Patient able to maintain position for approximately 20 seconds prior to fatigue and needing prolonged rest break (PT or OT providing assist at trunk due to poor trunk control and fatigue). Discharge remains appropriate, OT will continue to follow acutely.    Recommendations for follow up therapy are one component of a multi-disciplinary discharge planning process, led by the attending physician.  Recommendations may be updated based on patient status, additional functional criteria and insurance authorization.    Follow Up Recommendations  Skilled nursing-short term rehab (<3 hours/day)    Assistance Recommended at Discharge Frequent or constant Supervision/Assistance  Patient can return home with the following  Help with stairs or ramp for entrance;Assistance with feeding;Assistance with cooking/housework;Assist for transportation;Two people to help with walking and/or transfers;Direct supervision/assist for financial  management;Two people to help with bathing/dressing/bathroom;Direct supervision/assist for medications management   Equipment Recommendations  Wheelchair cushion (measurements OT);Wheelchair (measurements OT)    Recommendations for Other Services      Precautions / Restrictions Precautions Precautions: Fall Precaution Comments: enteric precautions Restrictions Weight Bearing Restrictions: No       Mobility Bed Mobility Overal bed mobility: Needs Assistance Bed Mobility: Supine to Sit, Sit to Supine     Supine to sit: Max assist, HOB elevated, +2 for safety/equipment, Total assist Sit to supine: +2 for physical assistance, Max assist, Total assist   General bed mobility comments: assist for legs off bed and to lift trunk, +2 to scoot hips to EOB; to supine assist for legs and trunk    Transfers Overall transfer level: Needs assistance Equipment used: Ambulation equipment used [name]) Transfers: Sit to/from Stand Sit to Stand: From elevated surface, Max assist, +2 physical assistance, Total assist           General transfer comment: attempting sit<>stands with stedy x3, unable to come fully into standing and bracing BLEs against bed and BUES on arm flaps in session, assist provided at glutes by PT and OT to attempt to activate to pull into standing (minimal initiation noted)     Balance Overall balance assessment: Needs assistance Sitting-balance support: Feet supported Sitting balance-Leahy Scale: Poor Sitting balance - Comments: reliant on UE support of bed or stedy flaps   Standing balance support: Bilateral upper extremity supported Standing balance-Leahy Scale: Poor Standing balance comment: able to put weight through legs at most for 20 seconds, did not ever fully stand upright  ADL either performed or assessed with clinical judgement   ADL Overall ADL's : Needs assistance/impaired                     Lower Body  Dressing: Total assistance;Bed level   Toilet Transfer: Total assistance;+2 for physical assistance;+2 for safety/equipment Toilet Transfer Details (indicate cue type and reason): attempting sit<>stands with stedy x3, unable to come fully into standing and bracing against bed and arm flaps in session         Functional mobility during ADLs: Total assistance;+2 for physical assistance;+2 for safety/equipment General ADL Comments: Patient session focus on increasing activity tolerance with sit<>stands through use of stedy    Extremity/Trunk Assessment              Vision       Perception     Praxis      Cognition Arousal/Alertness: Awake/alert Behavior During Therapy: WFL for tasks assessed/performed Overall Cognitive Status: Impaired/Different from baseline Area of Impairment: Memory, Attention, Following commands, Safety/judgement, Problem solving                   Current Attention Level: Sustained Memory: Decreased short-term memory Following Commands: Follows one step commands consistently, Follows one step commands with increased time Safety/Judgement: Decreased awareness of deficits Awareness: Intellectual Problem Solving: Slow processing General Comments: increased time to arouse and naswer questions (could be due to [facility]) however increased anxiety noted with movement when PT/OT attempting to move stedy toward [facility] and perseverating on "today has been quite a day"        Exercises      Shoulder Instructions       General Comments      Pertinent Vitals/ Pain       Pain Assessment Pain Assessment: Faces Faces Pain Scale: Hurts little more Pain Location: generalized with movement Pain Descriptors / Indicators: Grimacing, Guarding Pain Intervention(s): Limited activity within patient's tolerance, Monitored during session, Repositioned  Home Living                                          Prior Functioning/Environment               Frequency  Min 2X/week        Progress Toward Goals  OT Goals(current goals can now be found in the care plan section)  Progress towards OT goals: Progressing toward goals  Acute Rehab OT Goals Patient Stated Goal: to feel better OT Goal Formulation: With patient Time For Goal Achievement: 01/31/22 Potential to Achieve Goals: [place] Discharge plan remains appropriate    Co-evaluation    PT/OT/SLP Co-Evaluation/Treatment: Yes Reason for Co-Treatment: Complexity of the patient's impairments (multi-system involvement);For patient/therapist safety;To address functional/ADL transfers   OT goals addressed during session: ADL's and self-care;Strengthening/ROM      AM-PAC OT "6 Clicks" Daily Activity     Outcome Measure   Help from another person eating meals?: Total Help from another person taking care of personal grooming?: A Lot Help from another person toileting, which includes using toliet, bedpan, or urinal?: Total Help from another person bathing (including washing, rinsing, drying)?: A Lot Help from another person to put on and taking off regular upper body clothing?: A Lot Help from another person to put on and taking off regular lower body clothing?: Total 6 Click Score: 9    End of  Session Equipment Utilized During Treatment: Gait belt;Other (comment) [name])  OT Visit Diagnosis: Muscle weakness (generalized) (M62.81)   Activity Tolerance Patient limited by fatigue   Patient Left in bed;with call bell/phone within reach;with bed alarm set;Other (comment) (chair position)   Nurse Communication Mobility status        Time: [phone] OT Time Calculation (min): 28 min  Charges: OT General Charges $OT Visit: 1 Visit OT Treatments $Self Care/Home Management : 8-22 mins  Corinne Ports E. Braydn Carneiro, OTR/L Acute Rehabilitation Services [phone]   Ascencion Dike 01/23/2022, 3:37 PM

## 2022-01-23 NOTE — Progress Notes (Signed)
Staples removed from midline abdominal incision. Skin painted with benzoin and steri-strips applied per MD order.

## 2022-01-23 NOTE — Procedures (Signed)
Objective Swallowing Evaluation: Type of Study: FEES-Fiberoptic Endoscopic Evaluation of Swallow   Patient Details  Name: [name] MRN: [id number] Date of Birth: [birth year]  Today's Date: 01/23/2022 Time: SLP Start Time (ACUTE ONLY): 70 -SLP Stop Time (ACUTE ONLY): 7341  SLP Time Calculation (min) (ACUTE ONLY): 24 min   Past Medical History:  Past Medical History:  Diagnosis Date   Allergy    Bronchitis, chronic (HCC)    Chronic anxiety    Complication of anesthesia    hard to wake up   COPD (chronic obstructive pulmonary disease) (HCC)    Depression    Esophageal reflux    Headache(784.0)    Hyperlipidemia    Hypertension    Hypothyroidism    Obesity    Osteoporosis    Overactive bladder    PVC's (premature ventricular contractions)    Thyroid disease    Vitamin D deficiency disease    Past Surgical History:  Past Surgical History:  Procedure Laterality Date   ABDOMINAL HYSTERECTOMY     CENTRAL LINE INSERTION Right 01/10/2022   Procedure: CENTRAL LINE INSERTION;  Surgeon: Virl Cagey, MD;  Location: [facility];  Service: General;  Laterality: Right;   CHOLECYSTECTOMY N/A 11/04/2013   Procedure: LAPAROSCOPIC CHOLECYSTECTOMY WITH INTRAOPERATIVE CHOLANGIOGRAM;  Surgeon: Imogene Burn. Georgette Dover, MD;  Location: [place];  Service: General;  Laterality: N/A;   COLONOSCOPY     FRACTURE SURGERY     pins removed from Hip surgery   HIP FRACTURE SURGERY  1990    pins removed in [place] 01/10/2022   Procedure: EXPLORATORY LAPAROTOMY,;  Surgeon: Virl Cagey, MD;  Location: [facility];  Service: General;  Laterality: N/A;   LYSIS OF ADHESION N/A 01/10/2022   Procedure: LYSIS OF ADHESION;  Surgeon: Virl Cagey, MD;  Location: [facility];  Service: General;  Laterality: N/A;   [place] N/A 01/18/2022   Procedure: THORACENTESIS;  Surgeon: Candee Furbish, MD;  Location: [facility] ENDOSCOPY;  Service: Pulmonary;   Laterality: N/A;   UPPER GASTROINTESTINAL ENDOSCOPY     HPI: Patient is a 76 y.o. female with PMH: COPD, HLD, essential HTN, hypothyroidism, PE, osteoarthritis, depression, GERD. She presented initially to urgent care on 01/04/22 with acute onset abdominal pain, had CT which was unremarkable and she received IV fluids and antiemetics and was discharged hom. She presented to the hospital from home on 01/07/22 with vomiting and diarrhea since 01/04/22. In ED CT scan showed partial SBO. Surgery consulted and NG ordered. 8/15, had worsening renal function; 8/17 she went into shock and required emergent Exploratory laparotomy performed in OR and patient remained on vent on pressors post op. She [facility] at [facility] on 8/19 and CRRT started; 8/21 she was extubated, TNA started and CRRT discontinued.   Subjective: family present for assessment    Recommendations for follow up therapy are one component of a multi-disciplinary discharge planning process, led by the attending physician.  Recommendations may be updated based on patient status, additional functional criteria and insurance authorization.  Assessment / Plan / Recommendation     01/23/2022    1:34 PM  Clinical Impressions  Clinical Impression Pt presents with grossly functional swallowing, but exhbited delayed swallow intiation, with swallow at pyriform sinuses with most trials.  There was transient penetration prior to the swallow with thin liquids.  There was no aspiration of any consistencies.  With regular solid there was prolonged oral phase.  This appeared to be cognitive in nature, rather than the result of a focal oral impairment. Pt required repeated cuing to swallow cracker.  Oral phase was more efficient with simulated ground and soft solid textures.    Of note, there were white excressences/plaques inside laryngeal vestibule above vocal folds, bilaterally, but not in exact opposition to one another.  This may be from intubation versus thrush.     Pt may advance up to a mechanical soft solid diet as medically appropriate per surgery from GI standpoint.   SLP Visit Diagnosis Dysphagia, oropharyngeal phase (R13.12)  Impact on safety and function No limitations         01/23/2022    1:34 PM  Treatment Recommendations  Treatment Recommendations Therapy as outlined in treatment plan below        01/23/2022    1:41 PM  Prognosis  Prognosis for Safe Diet Advancement Good       01/23/2022    1:34 PM  Diet Recommendations  SLP Diet Recommendations Dysphagia 3 (Mech soft) solids;Thin liquid  Liquid Administration via Cup;Straw  Medication Administration Whole meds with puree  Compensations Slow rate;Small sips/bites  Postural Changes Seated upright at 90 degrees         01/23/2022    1:34 PM  Other Recommendations  Oral Care Recommendations Oral care BID  Follow Up Recommendations Follow physician's recommendations for discharge plan and follow up therapies  Assistance recommended at discharge Frequent or constant Supervision/Assistance  Functional Status Assessment Patient has had a recent decline in their functional status and demonstrates the ability to make significant improvements in function in a reasonable and predictable amount of time.       01/23/2022    1:34 PM  Frequency and Duration   Speech Therapy Frequency (ACUTE ONLY) min 2x/week  Treatment Duration 2 weeks         01/23/2022    1:33 PM  Oral Phase  Oral Phase Impaired  Oral - Thin Straw Premature spillage  Oral - Puree Premature spillage  Oral - Mech Soft Premature spillage  Oral - Regular Premature spillage       01/23/2022    1:33 PM  Pharyngeal Phase  Pharyngeal Phase Impaired  Pharyngeal- Thin Straw Delayed swallow initiation-pyriform sinuses;Penetration/Aspiration before swallow  Pharyngeal Material enters airway, remains ABOVE vocal cords then ejected out  Pharyngeal- Puree Delayed swallow initiation-pyriform sinuses  Pharyngeal  Material does not enter airway  Pharyngeal- Mechanical Soft Delayed swallow initiation-pyriform sinuses  Pharyngeal Material does not enter airway  Pharyngeal- Regular Delayed swallow initiation-pyriform sinuses  Pharyngeal Material does not enter airway        01/23/2022    1:34 PM  Cervical Esophageal Phase   Cervical Esophageal Phase [facility]     Celedonio Savage, MA, [place] Acute Rehabilitation Services Office: [phone]  01/23/2022, 1:41 PM

## 2022-01-23 NOTE — Progress Notes (Signed)
5 Days Post-Op   Subjective/Chief Complaint: On D1 diet.  The Nepro tends to make her a little nauseated as she really doesn't like this.  Her diarrhea is much improved. STEC noted on her GI pathogen panel   Objective: Vital signs in last 24 hours: Temp:  [97.6 F (36.4 C)-98.2 F (36.8 C)] 97.9 F (36.6 C) (08/30 0833) Pulse Rate:  [63-81] 80 (08/30 1021) Resp:  [14-18] 18 (08/30 1021) BP: (103-129)/(64-79) 110/69 (08/30 1021) SpO2:  [90 %-98 %] 90 % (08/30 1021) Weight:  [109 kg] 109 kg (08/30 0415) Last BM Date : 01/23/22  Intake/Output from previous day: 08/29 0701 - 08/30 0700 In: 1521.3 [P.O.:840; I.V.:369.3; Blood:312] Out: -  Intake/Output this shift: No intake/output data recorded.  PE: Lungs: normal effort on [place] Abd: soft, ND, honeycomb dressing removed today, incision c/d/I; overall minimally tender on exam  Extremities: BUE and BLE edema  Lab Results:  Recent Labs    01/21/22 0645 01/22/22 0429 01/23/22 0637  WBC 14.3*  --  12.1*  HGB 7.3* 7.1* 7.4*  HCT 22.6* 22.2* 22.8*  PLT 417*  --  454*   BMET Recent Labs    01/22/22 0814 01/23/22 0637  NA 134* 134*  K 3.8 3.7  CL 99 99  CO2 28 26  GLUCOSE 145* 119*  BUN 59* 69*  CREATININE 2.14* 2.48*  CALCIUM 7.8* 7.9*   PT/INR No results for input(s): "LABPROT", "INR" in the last 72 hours. ABG No results for input(s): "PHART", "HCO3" in the last 72 hours.  Invalid input(s): "PCO2", "PO2"   Studies/Results: DG Chest 2 View  Result Date: 01/22/2022 CLINICAL DATA:  Follow-up pleural effusion EXAM: CHEST - 2 VIEW COMPARISON:  Radiographs 01/20/2022 FINDINGS: Decreased size of the layering left pleural effusion since 01/20/2022. Remainder not substantially changed. Left-greater-than-right basilar airspace opacities. Bilateral interstitial opacities. No pneumothorax. Cardiomegaly. Aortic calcification. Right CVC tip in the right atrium.  Left IJ CVC tip in the low SVC. IMPRESSION: 1. Decreased size of  the left pleural effusion. 2. Cardiomegaly and mild edema. Electronically Signed   By: Placido Sou M.D.   On: 01/22/2022 08:02    Anti-infectives: Anti-infectives (From admission, onward)    Start     Dose/Rate Route Frequency Ordered Stop   01/16/22 2000  aztreonam (AZACTAM) 1 g in sodium chloride 0.9 % 100 mL IVPB  Status:  Discontinued        1 g 200 mL/hr over 30 Minutes Intravenous Every 8 hours 01/16/22 1840 01/20/22 1254   01/16/22 2000  metroNIDAZOLE (FLAGYL) IVPB 500 mg  Status:  Discontinued        500 mg 100 mL/hr over 60 Minutes Intravenous Every 8 hours 01/16/22 1840 01/20/22 1254   01/15/22 0526  aztreonam (AZACTAM) 1 g in sodium chloride 0.9 % 100 mL IVPB  Status:  Discontinued       See Hyperspace for full Linked Orders Report.   1 g 200 mL/hr over 30 Minutes Intravenous Every 24 hours 01/14/22 1529 01/14/22 1534   01/15/22 0526  aztreonam (AZACTAM) 1 g in sodium chloride 0.9 % 100 mL IVPB       See Hyperspace for full Linked Orders Report.   1 g 200 mL/hr over 30 Minutes Intravenous Every 24 hours 01/14/22 1534 01/15/22 0504   01/13/22 2100  vancomycin (VANCOCIN) IVPB 1000 mg/200 mL premix  Status:  Discontinued        1,000 mg 200 mL/hr over 60 Minutes Intravenous Every 24 hours 01/12/22  2135 01/13/22 1027   01/13/22 0500  aztreonam (AZACTAM) 2 g in sodium chloride 0.9 % 100 mL IVPB  Status:  Discontinued       See Hyperspace for full Linked Orders Report.   2 g 200 mL/hr over 30 Minutes Intravenous Every 12 hours 01/12/22 2135 01/14/22 1529   01/12/22 2015  vancomycin (VANCOREADY) IVPB 1750 mg/350 mL        1,750 mg 175 mL/hr over 120 Minutes Intravenous  Once 01/12/22 1921 01/12/22 2259   01/11/22 1800  aztreonam (AZACTAM) 1 g in sodium chloride 0.9 % 100 mL IVPB  Status:  Discontinued       See Hyperspace for full Linked Orders Report.   1 g 200 mL/hr over 30 Minutes Intravenous Every 8 hours 01/11/22 0815 01/12/22 2135   01/11/22 1000  levofloxacin  (LEVAQUIN) IVPB 500 mg  Status:  Discontinued        500 mg 100 mL/hr over 60 Minutes Intravenous Every 48 hours 01/10/22 1432 01/11/22 0744   01/11/22 0915  aztreonam (AZACTAM) 2 g in sodium chloride 0.9 % 100 mL IVPB       See Hyperspace for full Linked Orders Report.   2 g 200 mL/hr over 30 Minutes Intravenous  Once 01/11/22 0815 01/11/22 1045   01/11/22 0845  aztreonam (AZACTAM) 2 g in sodium chloride 0.9 % 100 mL IVPB  Status:  Discontinued        2 g 200 mL/hr over 30 Minutes Intravenous  Once 01/11/22 0747 01/11/22 0815   01/11/22 0845  metroNIDAZOLE (FLAGYL) IVPB 500 mg  Status:  Discontinued        500 mg 100 mL/hr over 60 Minutes Intravenous Every 12 hours 01/11/22 0747 01/15/22 0851   01/10/22 1900  levofloxacin (LEVAQUIN) IVPB 500 mg  Status:  Discontinued        500 mg 100 mL/hr over 60 Minutes Intravenous Every 24 hours 01/10/22 1426 01/10/22 1432   01/10/22 0700  ciprofloxacin (CIPRO) IVPB 400 mg        400 mg 200 mL/hr over 60 Minutes Intravenous On call to O.R. 01/10/22 0612 01/10/22 1052   01/10/22 0700  metroNIDAZOLE (FLAGYL) IVPB 500 mg  Status:  Discontinued        500 mg 100 mL/hr over 60 Minutes Intravenous On call to O.R. 01/10/22 0612 01/10/22 1123   01/08/22 0830  Levofloxacin (LEVAQUIN) IVPB 250 mg  Status:  Discontinued        250 mg 50 mL/hr over 60 Minutes Intravenous Every 24 hours 01/08/22 0724 01/08/22 0727   01/08/22 0830  Levofloxacin (LEVAQUIN) IVPB 250 mg  Status:  Discontinued        250 mg 50 mL/hr over 60 Minutes Intravenous Every 48 hours 01/08/22 0727 01/08/22 0732   01/08/22 0830  Levofloxacin (LEVAQUIN) IVPB 250 mg  Status:  Discontinued        250 mg 50 mL/hr over 60 Minutes Intravenous Every 24 hours 01/08/22 0732 01/10/22 1426       Assessment/Plan: POD#13 - status post ex lap with lysis of adhesions - Dr. Constance Haw ([facility]) - Afebrile WBC down to 12.  CT scan negative for intra-abdominal infection.   -diarrhea improving -c diff neg, +  for STEC -passed for D1 diet only thus far.  Eating some but not great.  Unfortunately not drinking much nepro as she doesn't like it.  On TNA, but would like to wean. -DC staples today.  FEN: D1 diet, 1/2 rate TPN today,  replacement of electrolytes per renal and primary; nephro shakes ID: aztreonam 8/18 >>8/22, 8/23 restarted and flagyl, no abx needed from surgery standpoint VTE: hep gtt Foley: purewick in place     Afib - IV hep, rate control meds per cards AKI - CRRT stopped 8/21, now with IHD ABL anemia - stable Volume overload/pleural effusions - per medicine   Data reviewed - vitals x 24 hrs, nursing note x 24hrs, CCM note, renal note, hospitalist note, and cardiology note, labs, imaging, I/Os   LOS: 16 days    Henreitta Cea PA-C 01/23/2022

## 2022-01-23 NOTE — Progress Notes (Addendum)
PROGRESS NOTE  [name] MCN:[id number] DOB: 06-04-1945   PCP: Chevis Pretty, FNP  Patient is from: Home  DOA: 01/07/2022 LOS: 66  Chief complaints Chief Complaint  Patient presents with   Abdominal Pain     Brief Narrative / Interim history: 76 year old F with PMH of PE, HTN, HLD, hypothyroidism, osteoarthritis and depression returned to [facility] ED with nausea, vomiting and diarrhea and found to small bowel obstruction.  She then developed AKI and septic shock and underwent emergent ex lap with lysis of adhesion on 8/17.  Postoperatively, she remained intubated, started on CRRT and transferred to [facility] on 8/19.  Eventually extubated on 8/21, and started on TNA.  She is started intermittent HD on 8/23.   Hospital course complicated by A-fib with RVR and diarrhea.  Cardiology consulted and she was started on IV amiodarone for A-fib with RVR.  In regards to diarrhea, C. difficile negative.  GIP positive for STEC.  Diarrhea improving.  Patient had repeat CT abdomen as part of evaluation for worsening abdominal pain that was negative for abscess but moderate bilateral pleural effusion.  She underwent right thoracocentesis with removal of 1.1 L on 8/25.  General surgery and nephrology following.  SLP recommended dysphagia 3 diet after FEES on 8/30.    Disposition likely SNF once cleared.   Subjective: Seen and examined earlier this morning.  No major events overnight or this morning.  She reports some abdominal pain with cough and lack of sleep in the hospital.  No nausea or vomiting.  Denies chest pain except when she coughs.  Objective: Vitals:   01/22/22 2014 01/23/22 0415 01/23/22 0833 01/23/22 1021  BP: 116/79 129/73 128/73 110/69  Pulse: 70 71 74 80  Resp: '17 18 18 18  '$ Temp: 98.2 F (36.8 C) 97.9 F (36.6 C) 97.9 F (36.6 C)   TempSrc: Oral Oral Oral   SpO2: 94% 95% 95% 90%  Weight:  109 kg    Height:        Examination:  GENERAL: No apparent  distress.  Nontoxic. HEENT: MMM.  Vision and hearing grossly intact.  NECK: Supple.  No apparent JVD.  RESP:  No IWOB.  Fair aeration bilaterally.  Rhonchi bilaterally. CVS:  RRR. Heart sounds normal.  ABD/GI/GU: BS+. Abd soft, NTND.  Staples over laparotomy wound. MSK/EXT:  Moves extremities. No apparent deformity. No edema.  SKIN: no apparent skin lesion or wound NEURO: Awake, alert and oriented x4.  No apparent focal neuro deficit. PSYCH: Calm. Normal affect.   Procedures:  8/17-laparotomy with lysis of adhesion   Microbiology summarized: 8/14-urine culture with multiple species 8/17-MRSA PCR screen negative 8/17-blood cultures NGTD 8/19-MRSA PCR screen negative 8/26-C. difficile PCR negative 8/25-GIP with STEC   Assessment and plan: Principal Problem:   Septic shock ([place]) Active Problems:   Respiratory failure requiring intubation (HCC)   Small bowel obstruction (HCC)   COPD (chronic obstructive pulmonary disease) ([place])   Hyperlipidemia   Hypothyroidism (acquired)   Essential hypertension   History of pulmonary embolus (PE)   AKI (acute kidney injury) ([place])   Acute respiratory failure with hypoxia and hypercapnia (HCC)   Pressure injury of skin   Malnutrition of moderate degree  Septic shock in the setting of mall bowel obstruction and intra-abdominal infection/STEC: Resolved. -Completed antibiotic course with aztreonam and metronidazole on 8/27 -Repeat CT abdomen on 8/24 negative for abscess and decreased bowel distention  SBO status post laparotomy 8/17 -TPN 8/21>>> -Dysphagia 3 diet on 8/30>>   Acute  Metabolic Encephalopathy: In the setting of septic shock and small bowel obstruction: Resolved.  Oriented x4. -Reorientation and delirium precaution   Paroxysmal A-fib with RVR/PVCs: Currently rate controlled. -Continue metoprolol and amiodarone -On IV heparin for anticoagulation.  We will transition to p.o. agent prior to discharge -Cardiology signed off.    Acute hypoxic respiratory failure/pulmonary edema/bilateral pleural effusion: -S/p right thoracocentesis on 8/25 by PCCM -Fluid management by HD per nephrology -Wean off oxygen.  Incentive spirometry and flutter valve.  AKI likely from ATN from sepsis Recent Labs    01/16/22 0627 01/17/22 0115 01/17/22 1100 01/18/22 0707 01/18/22 1145 01/19/22 0515 01/20/22 0343 01/21/22 0645 01/22/22 0814 01/23/22 0637  BUN 43* 34* 41* 54* 39* 47* 60* 77* 59* 69*  CREATININE 2.75* 2.08* 2.33* 2.57* 1.92* 2.19* 2.43* 2.73* 2.14* 2.48*  -CRRT discontinued on 8/21.  HD started on 8/23. -Nephrology following -Monitor intake and output -Avoid nephrotoxic meds     Infectious diarrhea/gastroenteritis due to STEC: Diarrhea subsided.   Acute on Chronic anemia: Anemia of acute illness Recent Labs    01/14/22 0400 01/15/22 0431 01/16/22 0627 01/17/22 0536 01/18/22 0707 01/19/22 0730 01/20/22 0343 01/21/22 0645 01/22/22 0429 01/23/22 0637  HGB 9.4* 8.4* 8.8* 8.3* 7.9* 7.4* 7.2* 7.3* 7.1* 7.4*  -Transfused 1 unit on 8/21 with minimal response.  No obvious bleeding noted. -Continue monitoring   Hyponatremia, hypokalemia, hypophosphatemia:  -Monitor and correct as appropriate   Leukocytosis: Likely demargination.  Improving. -Continue monitoring  Morbid obesity Body mass index is 41.26 kg/m.  Moderate malnutrition Nutrition Problem: Moderate Malnutrition Etiology: acute illness (SBO) Signs/Symptoms: energy intake < 75% for > 7 days, mild muscle depletion, moderate fat depletion Interventions: Nepro shake  Pressure skin injury: POA Pressure Injury 01/10/22 Coccyx Medial;Mid Stage 2 -  Partial thickness loss of dermis presenting as a shallow open injury with a red, pink wound bed without slough. pink (Active)  01/10/22 1200  Location: Coccyx  Location Orientation: Medial;Mid  Staging: Stage 2 -  Partial thickness loss of dermis presenting as a shallow open injury with a red, pink  wound bed without slough.  Wound Description (Comments): pink  Present on Admission:   Dressing Type Foam - Lift dressing to assess site every shift 01/22/22 0740     Pressure Injury 01/12/22 Face Left Stage 2 -  Partial thickness loss of dermis presenting as a shallow open injury with a red, pink wound bed without slough. red ulceration beneath ETT holder on L cheek (Active)  01/12/22 1815  Location: Face  Location Orientation: Left  Staging: Stage 2 -  Partial thickness loss of dermis presenting as a shallow open injury with a red, pink wound bed without slough.  Wound Description (Comments): red ulceration beneath ETT holder on L cheek  Present on Admission: Yes  Dressing Type None 01/22/22 0740     Pressure Injury 01/16/22 Lumbar Left (Active)  01/16/22 1230  Location: Lumbar  Location Orientation: Left  Staging:   Wound Description (Comments):   Present on Admission: Yes  Dressing Type None 01/19/22 2000     Pressure Injury 01/16/22 Buttocks Left (Active)  01/16/22 1230  Location: Buttocks  Location Orientation: Left  Staging:   Wound Description (Comments):   Present on Admission: Yes  Dressing Type None 01/19/22 2000   DVT prophylaxis:  Place and maintain sequential compression device Start: 01/20/22 0901  Code Status: DNR/DNI Family Communication: None at bedside Level of care: Progressive Status is: Inpatient Remains inpatient appropriate because: Small bowel obstruction, acute  respiratory failure with hypoxia and acute kidney failure   Final disposition: Likely SNF. Consultants:  Pulmonology Nephrology Cardiology  Sch Meds:  Scheduled Meds:  amiodarone  400 mg Oral Daily   Followed by   Derrill Memo ON 01/27/2022] amiodarone  200 mg Oral Daily   Chlorhexidine Gluconate Cloth  6 each Topical Q0600   escitalopram  10 mg Oral Daily   feeding supplement (NEPRO CARB STEADY)  237 mL Oral BID BM   ferrous sulfate  325 mg Oral Q breakfast   folic acid  1 mg Oral  Daily   guaiFENesin  600 mg Oral BID   metoprolol tartrate  12.5 mg Oral BID   sodium chloride flush  10-40 mL Intracatheter Q12H   sodium chloride flush  3 mL Intravenous Q12H   Continuous Infusions:  sodium chloride Stopped (01/16/22 1822)   sodium chloride Stopped (01/15/22 1255)   heparin 1,850 Units/hr (03/47/42 5956)   TPN CYCLIC-ADULT (ION)     PRN Meds:.sodium chloride, acetaminophen, albuterol, alteplase, guaiFENesin-dextromethorphan, lip balm, [DISCONTINUED] ondansetron **OR** ondansetron (ZOFRAN) IV, mouth rinse, oxyCODONE, sodium chloride flush, sodium chloride flush  Antimicrobials: Anti-infectives (From admission, onward)    Start     Dose/Rate Route Frequency Ordered Stop   01/16/22 2000  aztreonam (AZACTAM) 1 g in sodium chloride 0.9 % 100 mL IVPB  Status:  Discontinued        1 g 200 mL/hr over 30 Minutes Intravenous Every 8 hours 01/16/22 1840 01/20/22 1254   01/16/22 2000  metroNIDAZOLE (FLAGYL) IVPB 500 mg  Status:  Discontinued        500 mg 100 mL/hr over 60 Minutes Intravenous Every 8 hours 01/16/22 1840 01/20/22 1254   01/15/22 0526  aztreonam (AZACTAM) 1 g in sodium chloride 0.9 % 100 mL IVPB  Status:  Discontinued       See Hyperspace for full Linked Orders Report.   1 g 200 mL/hr over 30 Minutes Intravenous Every 24 hours 01/14/22 1529 01/14/22 1534   01/15/22 0526  aztreonam (AZACTAM) 1 g in sodium chloride 0.9 % 100 mL IVPB       See Hyperspace for full Linked Orders Report.   1 g 200 mL/hr over 30 Minutes Intravenous Every 24 hours 01/14/22 1534 01/15/22 0504   01/13/22 2100  vancomycin (VANCOCIN) IVPB 1000 mg/200 mL premix  Status:  Discontinued        1,000 mg 200 mL/hr over 60 Minutes Intravenous Every 24 hours 01/12/22 2135 01/13/22 1027   01/13/22 0500  aztreonam (AZACTAM) 2 g in sodium chloride 0.9 % 100 mL IVPB  Status:  Discontinued       See Hyperspace for full Linked Orders Report.   2 g 200 mL/hr over 30 Minutes Intravenous Every 12 hours  01/12/22 2135 01/14/22 1529   01/12/22 2015  vancomycin (VANCOREADY) IVPB 1750 mg/350 mL        1,750 mg 175 mL/hr over 120 Minutes Intravenous  Once 01/12/22 1921 01/12/22 2259   01/11/22 1800  aztreonam (AZACTAM) 1 g in sodium chloride 0.9 % 100 mL IVPB  Status:  Discontinued       See Hyperspace for full Linked Orders Report.   1 g 200 mL/hr over 30 Minutes Intravenous Every 8 hours 01/11/22 0815 01/12/22 2135   01/11/22 1000  levofloxacin (LEVAQUIN) IVPB 500 mg  Status:  Discontinued        500 mg 100 mL/hr over 60 Minutes Intravenous Every 48 hours 01/10/22 1432 01/11/22 0744   01/11/22  0915  aztreonam (AZACTAM) 2 g in sodium chloride 0.9 % 100 mL IVPB       See Hyperspace for full Linked Orders Report.   2 g 200 mL/hr over 30 Minutes Intravenous  Once 01/11/22 0815 01/11/22 1045   01/11/22 0845  aztreonam (AZACTAM) 2 g in sodium chloride 0.9 % 100 mL IVPB  Status:  Discontinued        2 g 200 mL/hr over 30 Minutes Intravenous  Once 01/11/22 0747 01/11/22 0815   01/11/22 0845  metroNIDAZOLE (FLAGYL) IVPB 500 mg  Status:  Discontinued        500 mg 100 mL/hr over 60 Minutes Intravenous Every 12 hours 01/11/22 0747 01/15/22 0851   01/10/22 1900  levofloxacin (LEVAQUIN) IVPB 500 mg  Status:  Discontinued        500 mg 100 mL/hr over 60 Minutes Intravenous Every 24 hours 01/10/22 1426 01/10/22 1432   01/10/22 0700  ciprofloxacin (CIPRO) IVPB 400 mg        400 mg 200 mL/hr over 60 Minutes Intravenous On call to O.R. 01/10/22 0612 01/10/22 1052   01/10/22 0700  metroNIDAZOLE (FLAGYL) IVPB 500 mg  Status:  Discontinued        500 mg 100 mL/hr over 60 Minutes Intravenous On call to O.R. 01/10/22 0612 01/10/22 1123   01/08/22 0830  Levofloxacin (LEVAQUIN) IVPB 250 mg  Status:  Discontinued        250 mg 50 mL/hr over 60 Minutes Intravenous Every 24 hours 01/08/22 0724 01/08/22 0727   01/08/22 0830  Levofloxacin (LEVAQUIN) IVPB 250 mg  Status:  Discontinued        250 mg 50 mL/hr over  60 Minutes Intravenous Every 48 hours 01/08/22 0727 01/08/22 0732   01/08/22 0830  Levofloxacin (LEVAQUIN) IVPB 250 mg  Status:  Discontinued        250 mg 50 mL/hr over 60 Minutes Intravenous Every 24 hours 01/08/22 0732 01/10/22 1426        I have personally reviewed the following labs and images: CBC: Recent Labs  Lab 01/18/22 0707 01/19/22 0730 01/20/22 0343 01/21/22 0645 01/22/22 0429 01/23/22 0637  WBC 19.1* 17.2* 14.3* 14.3*  --  12.1*  HGB 7.9* 7.4* 7.2* 7.3* 7.1* 7.4*  HCT 24.7* 22.9* 23.1* 22.6* 22.2* 22.8*  MCV 88.8 87.7 89.5 87.6  --  88.4  PLT 259 306 354 417*  --  454*   BMP &GFR Recent Labs  Lab 01/19/22 0515 01/20/22 0343 01/21/22 0645 01/22/22 0415 01/22/22 0814 01/23/22 0637  NA 129* 129* 131*  --  134* 134*  K 3.6 3.9 3.7  --  3.8 3.7  CL 96* 97* 96*  --  99 99  CO2 '24 25 26  '$ --  28 26  GLUCOSE 121* 110* 106*  --  145* 119*  BUN 47* 60* 77*  --  59* 69*  CREATININE 2.19* 2.43* 2.73*  --  2.14* 2.48*  CALCIUM 7.4* 7.5* 7.7*  --  7.8* 7.9*  MG 1.8 2.0 2.1 1.9  --  2.1  PHOS 3.0 3.8 4.4  --  3.7 3.6   Estimated Creatinine Clearance: 23.6 mL/min (A) (by C-G formula based on SCr of 2.48 mg/dL (H)). Liver & Pancreas: Recent Labs  Lab 01/21/22 0645 01/22/22 0814 01/23/22 0637  AST 16  --   --   ALT 16  --   --   ALKPHOS 93  --   --   BILITOT 0.3  --   --  PROT 4.7*  --   --   ALBUMIN 1.7* 1.7* 1.7*   No results for input(s): "LIPASE", "AMYLASE" in the last 168 hours. No results for input(s): "AMMONIA" in the last 168 hours. Diabetic: No results for input(s): "HGBA1C" in the last 72 hours. Recent Labs  Lab 01/22/22 0146 01/22/22 1404 01/23/22 0000 01/23/22 0627 01/23/22 1154  GLUCAP 118* 116* 100* 114* 108*   Cardiac Enzymes: No results for input(s): "CKTOTAL", "CKMB", "CKMBINDEX", "TROPONINI" in the last 168 hours. No results for input(s): "PROBNP" in the last 8760 hours. Coagulation Profile: No results for input(s): "INR",  "PROTIME" in the last 168 hours. Thyroid Function Tests: No results for input(s): "TSH", "T4TOTAL", "FREET4", "T3FREE", "THYROIDAB" in the last 72 hours. Lipid Profile: Recent Labs    01/21/22 0645  TRIG 18   Anemia Panel: No results for input(s): "VITAMINB12", "FOLATE", "FERRITIN", "TIBC", "IRON", "RETICCTPCT" in the last 72 hours. Urine analysis:    Component Value Date/Time   COLORURINE YELLOW 01/11/2022 1051   APPEARANCEUR CLOUDY (A) 01/11/2022 1051   APPEARANCEUR Clear 03/01/2021 1257   LABSPEC 1.010 01/11/2022 1051   PHURINE 5.0 01/11/2022 1051   GLUCOSEU NEGATIVE 01/11/2022 1051   HGBUR SMALL (A) 01/11/2022 1051   BILIRUBINUR NEGATIVE 01/11/2022 1051   BILIRUBINUR Negative 03/01/2021 [place] 01/11/2022 1051   PROTEINUR NEGATIVE 01/11/2022 1051   NITRITE NEGATIVE 01/11/2022 1051   LEUKOCYTESUR LARGE (A) 01/11/2022 1051   Sepsis Labs: Invalid input(s): "PROCALCITONIN", "LACTICIDVEN"  Microbiology: Recent Results (from the past 240 hour(s))  Gastrointestinal Panel by PCR , Stool     Status: Abnormal   Collection Time: 01/18/22  3:42 PM   Specimen: Stool  Result Value Ref Range Status   Campylobacter species NOT DETECTED NOT DETECTED Final   Plesimonas shigelloides NOT DETECTED NOT DETECTED Final   Salmonella species NOT DETECTED NOT DETECTED Final   Yersinia enterocolitica NOT DETECTED NOT DETECTED Final   Vibrio species NOT DETECTED NOT DETECTED Final   Vibrio cholerae NOT DETECTED NOT DETECTED Final   Enteroaggregative E coli (EAEC) NOT DETECTED NOT DETECTED Final   Enterotoxigenic E coli (ETEC) NOT DETECTED NOT DETECTED Final   Shiga like toxin producing E coli (STEC) DETECTED (A) NOT DETECTED Final    Comment: RESULT CALLED TO, READ BACK BY AND VERIFIED WITH: NAA DORMON 01/20/22 1417 AMK    E. coli O157 NOT DETECTED NOT DETECTED Final   Shigella/Enteroinvasive E coli (EIEC) NOT DETECTED NOT DETECTED Final   Cryptosporidium NOT DETECTED NOT  DETECTED Final   Cyclospora cayetanensis NOT DETECTED NOT DETECTED Final   Entamoeba histolytica NOT DETECTED NOT DETECTED Final   Giardia lamblia NOT DETECTED NOT DETECTED Final   Adenovirus F40/41 NOT DETECTED NOT DETECTED Final   Astrovirus NOT DETECTED NOT DETECTED Final   Norovirus GI/GII NOT DETECTED NOT DETECTED Final   Rotavirus A NOT DETECTED NOT DETECTED Final   Sapovirus (I, II, IV, and V) NOT DETECTED NOT DETECTED Final    Comment: Performed at [facility], [address] [place], [place] [address]  C Difficile Quick Screen (NO PCR Reflex)     Status: None   Collection Time: 01/19/22  6:32 PM   Specimen: STOOL  Result Value Ref Range Status   C Diff antigen NEGATIVE NEGATIVE Final   C Diff toxin NEGATIVE NEGATIVE Final   C Diff interpretation No C. difficile detected.  Final    Comment: Performed at [facility], [place] [address] [place], [place] [address]  Radiology Studies: No results found.    Caprisha Bridgett T. Troy  If 7PM-7AM, please contact night-coverage [email or web address] 01/23/2022, 2:24 PM

## 2022-01-23 NOTE — Progress Notes (Addendum)
PHARMACY - TOTAL PARENTERAL NUTRITION CONSULT NOTE   Indication: Prolonged ileus  Patient Measurements: Height: '5\' 4"'$  (162.6 cm) Weight: 109 kg (240 lb 6.4 oz) IBW/kg (Calculated) : 54.7 TPN AdjBW (KG): 60.7 Body mass index is 41.26 kg/m.  Assessment: 76 yo female with septic shock, high-grade small bowel obstruction. Underwent surgery on 8/17 with ex lap and lysis of adhesions for SBO. Reported vomiting and diarrhea since 8/11 up until surgery on 8/17. Pharmacy consulted for TPN due to prolonged ileus.  8/30 Overnight: Appears TPN was started at 1819 and subsequently held 10 minutes later due to patient receiving a blood transfusion (per MD request). MAR appears TPN was restarted at 2106 at 45 mL/hr and never increased. Spoke with dayshift RN today and verified TPN has been running appropriately through the evening at 85 mL/hr.  Glucose / Insulin: no hx DM - CBGs < 140, off SSI Electrolytes: Na 134, Cl 99, CoCa 9.7 Renal: 8/19-8/21 CRRT >> 8/23 HD (last on 8/28), BUN 69  Hepatic: AST / ALT / tbili wnl, TG 18, albumin 1.7 Intake / Output; MIVF: UOP not documented past 24 hours GI Imaging:  8/16 KUB: high grade bowel obstruction  8/24 CT abd: decr gas distention, anasarca throughout abd wall, distention of lower abdominal/pelvic small bowel loops  8/24 KUB: persistent SBO vs. post-op ileus, suspected ascites   GI Surgeries / Procedures:  8/17 with ex lap and LOA for SBO  Central access: triple lumen CVC placed 01/10/22 TPN start date: 01/14/22  Nutritional Goals:  RD Estimated Needs Total Energy Estimated Needs: 1700-1900 Total Protein Estimated Needs: 100-120 gm Total Fluid Estimated Needs: 1L + UOP  Current Nutrition:  TPN 8/27 CLD 8/28 DYS 1    Plan:  Cycle concentrated TPN at half goal (rate 23-46 ml/hr, GIR 0.58-1.16 mg/kg/min) provides 59g AA and 960 Kcal, meeting 50% estimated needs with goal to wean Electrolytes in TPN: Na 150 mEq/L (Max), K 16 mEq/L, Ca 5 mEq/L, Mg 7  mEq/L, Phos 2 mmol/L, GY:KZLD 1:1 (will be half due to half rate) Add standard MVI and trace elements to TPN - no chromium since on iHD Monitor TPN labs on Mon/Thurs F/u PO intake/diet advancement  Erskine Speed, PharmD Clinical Pharmacist

## 2022-01-23 NOTE — TOC Progression Note (Signed)
Transition of Care [facility]) - Progression Note    Patient Details  Name: [name] MRN: [id number] Date of Birth: 18-Mar-1946  Transition of Care [facility]) CM/SW [place], [place] Phone Number: 01/23/2022, 4:30 PM  Clinical Narrative:     CSW following to fax patient out for SNF closer to patient being medically ready for dc. Patient currently on TPN. CSW will continue to follow and assist with patients dc planning needs.  Expected Discharge Plan: Home/Self Care Barriers to Discharge: Continued Medical Work up  Expected Discharge Plan and Services Expected Discharge Plan: Home/Self Care                                               Social Determinants of Health (SDOH) Interventions    Readmission Risk Interventions     No data to display

## 2022-01-23 NOTE — Progress Notes (Signed)
Physical Therapy Treatment Patient Details Name: [name] MRN: [id number] DOB: Jul 29, 1945 Today's Date: 01/23/2022   History of Present Illness 76 yo female admitted 8/14 to [facility] with vomiting and diarrhea with partial SBO. 8/17 intubated and ex lap. 8/19 pt with worsening renal function, transfer to [facility] and CRRT started. 8/21 extubation with CRRT off. PMHx: COPD, HLD, HTN, PE, hypothyroidism, OA and depression    PT Comments    Patient seen initially for checking lymphedema wrappings and noted pt lifting the arm easier and with less complaint of pain.  Wraps intact and pt able to move all joints easily.  Daughter reports using L hand to drink from cup earlier.  Daughter reports no plans for dialysis today and needs.  Decided to mobilize as well today since may go to dialysis tomorrow.  Seen with OT and pt able to stand x 3, but fatigued quickly and with back pain when not supported.  Patient remains appropriate for SNF and daughter requesting [facility] and LCSW made aware.  PT to follow up.   Recommendations for follow up therapy are one component of a multi-disciplinary discharge planning process, led by the attending physician.  Recommendations may be updated based on patient status, additional functional criteria and insurance authorization.  Follow Up Recommendations  Skilled nursing-short term rehab (<3 hours/day) Can patient physically be transported by private vehicle: No   Assistance Recommended at Discharge Frequent or constant Supervision/Assistance  Patient can return home with the following     Equipment Recommendations       Recommendations for Other Services       Precautions / Restrictions Precautions Precautions: Fall Precaution Comments: enteric precautions Restrictions Weight Bearing Restrictions: No     Mobility  Bed Mobility Overal bed mobility: Needs Assistance Bed Mobility: Supine to Sit, Sit to Supine Rolling: Max assist, +2 for safety/equipment    Supine to sit: Max assist, HOB elevated, +2 for safety/equipment, Total assist Sit to supine: +2 for physical assistance, Max assist, Total assist   General bed mobility comments: assist for legs off bed and to lift trunk, +2 to scoot hips to EOB; to supine assist for legs and trunk    Transfers Overall transfer level: Needs assistance Equipment used: Ambulation equipment used Charlaine Dalton) Transfers: Sit to/from Stand, Bed to chair/wheelchair/BSC             General transfer comment: attempting sit<>stands with stedy x3, unable to come fully into standing and bracing BLEs against bed and BUES on arm flaps in session, assist provided at glutes by PT and OT to attempt to activate to pull into standing (minimal initiation noted)    Ambulation/Gait               General Gait Details: unable   Stairs             Wheelchair Mobility    Modified Rankin (Stroke Patients Only)       Balance Overall balance assessment: Needs assistance Sitting-balance support: Feet supported Sitting balance-Leahy Scale: Poor Sitting balance - Comments: reliant on UE support of bed or stedy flaps   Standing balance support: Bilateral upper extremity supported Standing balance-Leahy Scale: Poor Standing balance comment: able to put weight through legs at most for 20 seconds, did not ever fully stand upright                            Cognition Arousal/Alertness: Awake/alert, Lethargic Behavior During Therapy: [facility] for  tasks assessed/performed Overall Cognitive Status: Impaired/Different from baseline Area of Impairment: Memory, Attention, Following commands, Safety/judgement, Problem solving                   Current Attention Level: Sustained Memory: Decreased short-term memory Following Commands: Follows one step commands consistently, Follows one step commands with increased time Safety/Judgement: Decreased awareness of deficits   Problem Solving: Slow  processing General Comments: increased time to arouse and naswer questions (could be due to [facility]) however increased anxiety noted with movement when PT/OT attempting to move stedy toward [facility] and perseverating on "today has been quite a day"        Exercises      General Comments General comments (skin integrity, edema, etc.): VSS with activity HR up to 123, SpO2 99 on 2L O2      Pertinent Vitals/Pain Pain Assessment Faces Pain Scale: Hurts little more Pain Location: generalized with movement Pain Descriptors / Indicators: Grimacing, Guarding Pain Intervention(s): Repositioned    Home Living                          Prior Function            PT Goals (current goals can now be found in the care plan section) Progress towards PT goals: Progressing toward goals    Frequency    Min 3X/week      PT Plan Current plan remains appropriate    Co-evaluation PT/OT/SLP Co-Evaluation/Treatment: Yes Reason for Co-Treatment: Complexity of the patient's impairments (multi-system involvement);To address functional/ADL transfers;For patient/therapist safety   OT goals addressed during session: ADL's and self-care;Strengthening/ROM      AM-PAC PT "6 Clicks" Mobility   Outcome Measure  Help needed turning from your back to your side while in a flat bed without using bedrails?: A Lot Help needed moving from lying on your back to sitting on the side of a flat bed without using bedrails?: Total Help needed moving to and from a bed to a chair (including a wheelchair)?: Total Help needed standing up from a chair using your arms (e.g., wheelchair or bedside chair)?: Total Help needed to walk in hospital room?: Total Help needed climbing 3-5 steps with a railing? : Total 6 Click Score: 7    End of Session Equipment Utilized During Treatment: Gait belt;Oxygen Activity Tolerance: Patient limited by fatigue Patient left: in bed;with call bell/phone within reach Nurse  Communication: Other (comment) (TBA) PT Visit Diagnosis: Other abnormalities of gait and mobility (R26.89);Difficulty in walking, not elsewhere classified (R26.2);Muscle weakness (generalized) (M62.81)     Time: 1962-2297 PT Time Calculation (min) (ACUTE ONLY): 28 min  Charges:  $Therapeutic Activity: 8-22 mins                     Magda Kiel, PT Acute Rehabilitation Services Office:[phone] 01/23/2022    [name] 01/23/2022, 4:10 PM

## 2022-01-24 DIAGNOSIS — J449 Chronic obstructive pulmonary disease, unspecified: Secondary | ICD-10-CM | POA: Diagnosis not present

## 2022-01-24 DIAGNOSIS — K56609 Unspecified intestinal obstruction, unspecified as to partial versus complete obstruction: Secondary | ICD-10-CM | POA: Diagnosis not present

## 2022-01-24 DIAGNOSIS — J9690 Respiratory failure, unspecified, unspecified whether with hypoxia or hypercapnia: Secondary | ICD-10-CM | POA: Diagnosis not present

## 2022-01-24 DIAGNOSIS — A419 Sepsis, unspecified organism: Secondary | ICD-10-CM | POA: Diagnosis not present

## 2022-01-24 LAB — HEPARIN LEVEL (UNFRACTIONATED): Heparin Unfractionated: 0.74 IU/mL — ABNORMAL HIGH (ref 0.30–0.70)

## 2022-01-24 LAB — RETICULOCYTES
Immature Retic Fract: 24.9 % — ABNORMAL HIGH (ref 2.3–15.9)
RBC.: 2.68 MIL/uL — ABNORMAL LOW (ref 3.87–5.11)
Retic Count, Absolute: 91.7 10*3/uL (ref 19.0–186.0)
Retic Ct Pct: 3.4 % — ABNORMAL HIGH (ref 0.4–3.1)

## 2022-01-24 LAB — RENAL FUNCTION PANEL
Albumin: 1.9 g/dL — ABNORMAL LOW (ref 3.5–5.0)
Anion gap: 8 (ref 5–15)
BUN: 80 mg/dL — ABNORMAL HIGH (ref 8–23)
CO2: 28 mmol/L (ref 22–32)
Calcium: 8.3 mg/dL — ABNORMAL LOW (ref 8.9–10.3)
Chloride: 100 mmol/L (ref 98–111)
Creatinine, Ser: 2.71 mg/dL — ABNORMAL HIGH (ref 0.44–1.00)
GFR, Estimated: 18 mL/min — ABNORMAL LOW (ref >60)
Glucose, Bld: 95 mg/dL (ref 70–99)
Phosphorus: 4.4 mg/dL (ref 2.5–4.6)
Potassium: 3.9 mmol/L (ref 3.5–5.1)
Sodium: 136 mmol/L (ref 135–145)

## 2022-01-24 LAB — FERRITIN: Ferritin: 111 ng/mL (ref 11–307)

## 2022-01-24 LAB — CBC
HCT: 23.8 % — ABNORMAL LOW (ref 36.0–46.0)
HCT: 24.7 % — ABNORMAL LOW (ref 36.0–46.0)
Hemoglobin: 7.6 g/dL — ABNORMAL LOW (ref 12.0–15.0)
Hemoglobin: 7.6 g/dL — ABNORMAL LOW (ref 12.0–15.0)
MCH: 28.5 pg (ref 26.0–34.0)
MCH: 27.8 pg (ref 26.0–34.0)
MCHC: 31.9 g/dL (ref 30.0–36.0)
MCHC: 30.8 g/dL (ref 30.0–36.0)
MCV: 89.1 fL (ref 80.0–100.0)
MCV: 90.5 fL (ref 80.0–100.0)
Platelets: 532 10*3/uL — ABNORMAL HIGH (ref 150–400)
Platelets: 560 10*3/uL — ABNORMAL HIGH (ref 150–400)
RBC: 2.67 MIL/uL — ABNORMAL LOW (ref 3.87–5.11)
RBC: 2.73 MIL/uL — ABNORMAL LOW (ref 3.87–5.11)
RDW: 17.5 % — ABNORMAL HIGH (ref 11.5–15.5)
RDW: 17.6 % — ABNORMAL HIGH (ref 11.5–15.5)
WBC: 11.2 10*3/uL — ABNORMAL HIGH (ref 4.0–10.5)
WBC: 10.9 10*3/uL — ABNORMAL HIGH (ref 4.0–10.5)
nRBC: 0 % (ref 0.0–0.2)
nRBC: 0 % (ref 0.0–0.2)

## 2022-01-24 LAB — TRIGLYCERIDES: Triglycerides: 35 mg/dL (ref <150)

## 2022-01-24 LAB — GLUCOSE, CAPILLARY
Glucose-Capillary: 105 mg/dL — ABNORMAL HIGH (ref 70–99)
Glucose-Capillary: 98 mg/dL (ref 70–99)
Glucose-Capillary: 98 mg/dL (ref 70–99)

## 2022-01-24 LAB — COMPREHENSIVE METABOLIC PANEL
ALT: 15 U/L (ref 0–44)
AST: 15 U/L (ref 15–41)
Albumin: 1.8 g/dL — ABNORMAL LOW (ref 3.5–5.0)
Alkaline Phosphatase: 88 U/L (ref 38–126)
Anion gap: 8 (ref 5–15)
BUN: 78 mg/dL — ABNORMAL HIGH (ref 8–23)
CO2: 27 mmol/L (ref 22–32)
Calcium: 8.2 mg/dL — ABNORMAL LOW (ref 8.9–10.3)
Chloride: 100 mmol/L (ref 98–111)
Creatinine, Ser: 2.74 mg/dL — ABNORMAL HIGH (ref 0.44–1.00)
GFR, Estimated: 18 mL/min — ABNORMAL LOW (ref >60)
Glucose, Bld: 105 mg/dL — ABNORMAL HIGH (ref 70–99)
Potassium: 3.8 mmol/L (ref 3.5–5.1)
Sodium: 135 mmol/L (ref 135–145)
Total Bilirubin: 0.6 mg/dL (ref 0.3–1.2)
Total Protein: 5.1 g/dL — ABNORMAL LOW (ref 6.5–8.1)

## 2022-01-24 LAB — MAGNESIUM: Magnesium: 2.3 mg/dL (ref 1.7–2.4)

## 2022-01-24 LAB — IRON AND TIBC
Iron: 24 ug/dL — ABNORMAL LOW (ref 28–170)
Saturation Ratios: 9 % — ABNORMAL LOW (ref 10.4–31.8)
TIBC: 262 ug/dL (ref 250–450)
UIBC: 238 ug/dL

## 2022-01-24 LAB — FOLATE: Folate: 11.8 ng/mL (ref >5.9)

## 2022-01-24 LAB — VITAMIN B12: Vitamin B-12: 981 pg/mL — ABNORMAL HIGH (ref 180–914)

## 2022-01-24 LAB — PHOSPHORUS: Phosphorus: 4.3 mg/dL (ref 2.5–4.6)

## 2022-01-24 MED ORDER — ADULT MULTIVITAMIN W/MINERALS CH
1.0000 | ORAL_TABLET | Freq: Every day | ORAL | Status: DC
  Administered 2022-01-24 – 2022-02-03 (×10): 1 via ORAL
  Filled 2022-01-24 (×12): qty 1

## 2022-01-24 MED ORDER — SODIUM CHLORIDE 0.9 % IV SOLN
250.0000 mg | Freq: Every day | INTRAVENOUS | Status: AC
Start: 2022-01-24 — End: 2022-01-25
  Administered 2022-01-24 – 2022-01-25 (×2): 250 mg via INTRAVENOUS
  Filled 2022-01-24 (×2): qty 20

## 2022-01-24 MED ORDER — HEPARIN SODIUM (PORCINE) 1000 UNIT/ML IJ SOLN
INTRAMUSCULAR | Status: AC
  Filled 2022-01-24: qty 4

## 2022-01-24 MED ORDER — STERILE WATER FOR INJECTION IV SOLN
INTRAVENOUS | Status: AC
  Filled 2022-01-24: qty 390

## 2022-01-24 MED ORDER — CHLORHEXIDINE GLUCONATE CLOTH 2 % EX PADS
6.0000 | MEDICATED_PAD | Freq: Every day | CUTANEOUS | Status: DC
  Administered 2022-01-25: 6 via TOPICAL

## 2022-01-24 NOTE — Progress Notes (Signed)
PHARMACY - TOTAL PARENTERAL NUTRITION CONSULT NOTE   Indication: Prolonged ileus  Patient Measurements: Height: '5\' 4"'$  (162.6 cm) Weight: 110 kg (242 lb 8.1 oz) IBW/kg (Calculated) : 54.7 TPN AdjBW (KG): 60.7 Body mass index is 41.63 kg/m.  Assessment: 76 yo female with septic shock, high-grade small bowel obstruction. Underwent surgery on 8/17 with ex lap and lysis of adhesions for SBO. Reported vomiting and diarrhea since 8/11 up until surgery on 8/17. Pharmacy consulted for TPN due to prolonged ileus.  Spoke with patient regarding oral intake. Ate mostly liquids yesterday such as coffee, jello, ice cream. Was awaiting assistance to eat breakfast this morning, but has an appetite to eat more orally.  Glucose / Insulin: no hx DM - CBGs < 140, off SSI Electrolytes: CoCa 10 - all others WNL Renal: 8/19-8/21 CRRT >> 8/23 HD (last on 8/28), BUN 69  Hepatic: AST / ALT / tbili wnl, TG 35, albumin 1.8 Intake / Output; MIVF: UOP 1 occurrence documented last 24 hours, LBM 8/30 GI Imaging:  8/16 KUB: high grade bowel obstruction  8/24 CT abd: decr gas distention, anasarca throughout abd wall, distention of lower abdominal/pelvic small bowel loops  8/24 KUB: persistent SBO vs. post-op ileus, suspected ascites   GI Surgeries / Procedures:  8/17 with ex lap and LOA for SBO  Central access: triple lumen CVC placed 01/10/22 TPN start date: 01/14/22  Nutritional Goals:  RD Estimated Needs Total Energy Estimated Needs: 1700-1900 Total Protein Estimated Needs: 100-120 gm Total Fluid Estimated Needs: 1L + UOP  Current Nutrition:  TPN 8/27 CLD 8/28 DYS 1 8/30 DYS 3    Plan:  Continue cycle concentrated TPN at half goal (rate 23-46 ml/hr, GIR 0.58-1.16 mg/kg/min) which provides 59g AA and 960 Kcal, meeting 50% estimated needs with goal to wean Electrolytes in TPN: Na 150 mEq/L (Max), K 16 mEq/L, Ca 5 mEq/L, Mg 7 mEq/L, Phos 2 mmol/L, CV:KFMM 1:1 (will be half due to half rate) Change MVI and  trace elements to enteral given full dysphagia diet Monitor TPN labs on Mon/Thurs F/u PO intake/diet advancement  Erskine Speed, PharmD Clinical Pharmacist

## 2022-01-24 NOTE — Progress Notes (Signed)
PROGRESS NOTE  [name] JIR:[id number] DOB: 1945/06/13   PCP: Chevis Pretty, FNP  Patient is from: Home  DOA: 01/07/2022 LOS: 40  Chief complaints Chief Complaint  Patient presents with   Abdominal Pain     Brief Narrative / Interim history: 76 year old F with PMH of PE, HTN, HLD, hypothyroidism, osteoarthritis and depression returned to [facility] ED with nausea, vomiting and diarrhea and found to small bowel obstruction.  She then developed AKI and septic shock and underwent emergent ex lap with lysis of adhesion on 8/17.  Postoperatively, she remained intubated, started on CRRT and transferred to [facility] on 8/19.  Eventually extubated on 8/21, and started on TNA.  She is started intermittent HD on 8/23.   Hospital course complicated by A-fib with RVR and diarrhea.  Cardiology consulted and she was started on IV amiodarone for A-fib with RVR.  In regards to diarrhea, C. difficile negative.  GIP positive for STEC.  Diarrhea improving.  Patient had repeat CT abdomen as part of evaluation for worsening abdominal pain that was negative for abscess but moderate bilateral pleural effusion.  She underwent right thoracocentesis with removal of 1.1 L on 8/25.  General surgery and nephrology following.  SLP recommended dysphagia 3 diet after FEES on 8/30.    Disposition likely SNF once cleared by nephrology and general surgery  Subjective: Seen and examined earlier this morning.  No major events overnight of this morning.  Feels better today.  She denies shortness of breath.  She reports chest tightness.  Denies GI symptoms.  Objective: Vitals:   01/23/22 1455 01/23/22 1940 01/24/22 0430 01/24/22 0628  BP: 116/71 (!) 119/59 128/77 119/76  Pulse: 74 69 66 79  Resp: '15 19 15 16  '$ Temp: 98.3 F (36.8 C) 98 F (36.7 C) 97.8 F (36.6 C) 97.9 F (36.6 C)  TempSrc: Oral Oral Oral Oral  SpO2: 99% 100% 96% 97%  Weight:    110 kg  Height:        Examination:  GENERAL: No  apparent distress.  Nontoxic. HEENT: MMM.  Vision and hearing grossly intact.  NECK: Supple.  No apparent JVD.  RESP:  No IWOB.  Fair aeration bilaterally. CVS:  RRR. Heart sounds normal.  ABD/GI/GU: BS+. Abd soft, NTND.  Laparotomy wound healed. MSK/EXT:  Moves extremities. No apparent deformity. No edema.  Ace wrap over LUE. SKIN: no apparent skin lesion or wound NEURO: Awake and alert. Oriented appropriately.  No apparent focal neuro deficit. PSYCH: Calm. Normal affect.  Procedures:  8/17-laparotomy with lysis of adhesion   Microbiology summarized: 8/14-urine culture with multiple species 8/17-MRSA PCR screen negative 8/17-blood cultures NGTD 8/19-MRSA PCR screen negative 8/26-C. difficile PCR negative 8/25-GIP with STEC   Assessment and plan: Principal Problem:   Septic shock ([place]) Active Problems:   Respiratory failure requiring intubation (HCC)   Small bowel obstruction (HCC)   COPD (chronic obstructive pulmonary disease) ([place])   Hyperlipidemia   Hypothyroidism (acquired)   Essential hypertension   History of pulmonary embolus (PE)   AKI (acute kidney injury) ([place])   Acute respiratory failure with hypoxia and hypercapnia (HCC)   Pressure injury of skin   Malnutrition of moderate degree  Septic shock in the setting of mall bowel obstruction and intra-abdominal infection/STEC: Resolved. -Completed antibiotic course with aztreonam and metronidazole on 8/27 -Repeat CT abdomen on 8/24 negative for abscess and decreased bowel distention  SBO status post laparotomy 8/17 -TPN 8/21>>> -Dysphagia 3 diet on 7/51>>   Acute Metabolic Encephalopathy:  In the setting of septic shock and SBO: Resolved.  Oriented x4. -Reorientation and delirium precaution   Paroxysmal A-fib with RVR/PVCs: Currently rate controlled. -Continue metoprolol and amiodarone -On IV heparin for anticoagulation.  We will transition to p.o. agent prior to discharge -Cardiology signed off.   Acute  hypoxic respiratory failure/pulmonary edema/bilateral pleural effusion: Has chest tightness again due to fluid overload in the setting of renal failure. -S/p right thoracocentesis on 8/25 by PCCM -Fluid management by HD per nephrology -Wean off oxygen.  Incentive spirometry and flutter valve.  AKI/azotemia: Likely from ATN from sepsis.  Slightly worse today Recent Labs    01/17/22 0115 01/17/22 1100 01/18/22 0707 01/18/22 1145 01/19/22 0515 01/20/22 0343 01/21/22 0645 01/22/22 0814 01/23/22 0637 01/24/22 0500  BUN 34* 41* 54* 39* 47* 60* 77* 59* 69* 78*  CREATININE 2.08* 2.33* 2.57* 1.92* 2.19* 2.43* 2.73* 2.14* 2.48* 2.74*  -CRRT discontinued on 8/21.  HD started on 8/23. -Nephrology following-plan to resume dialysis. -Monitor intake and output -Avoid nephrotoxic meds     Infectious diarrhea/gastroenteritis due to STEC: Diarrhea resolved.   Iron deficiency anemia superimposed on anemia of chronic disease: H&H stable after transfusion. Recent Labs    01/15/22 0431 01/16/22 0627 01/17/22 0536 01/18/22 0707 01/19/22 0730 01/20/22 0343 01/21/22 0645 01/22/22 0429 01/23/22 0637 01/24/22 0500  HGB 8.4* 8.8* 8.3* 7.9* 7.4* 7.2* 7.3* 7.1* 7.4* 7.6*  -Transfused 1 unit on 8/21 with minimal response.  No obvious bleeding noted. -Continue monitoring   Hyponatremia, hypokalemia, hypophosphatemia: Resolved. -Monitor and correct as appropriate   Leukocytosis: Likely demargination.  Improving. -Continue monitoring  Morbid obesity Body mass index is 41.63 kg/m.  Moderate malnutrition Nutrition Problem: Moderate Malnutrition Etiology: acute illness (SBO) Signs/Symptoms: energy intake < 75% for > 7 days, mild muscle depletion, moderate fat depletion Interventions: Nepro shake  Pressure skin injury: POA Pressure Injury 01/10/22 Coccyx Medial;Mid Stage 2 -  Partial thickness loss of dermis presenting as a shallow open injury with a red, pink wound bed without slough. pink  (Active)  01/10/22 1200  Location: Coccyx  Location Orientation: Medial;Mid  Staging: Stage 2 -  Partial thickness loss of dermis presenting as a shallow open injury with a red, pink wound bed without slough.  Wound Description (Comments): pink  Present on Admission:   Dressing Type Foam - Lift dressing to assess site every shift 01/22/22 0740     Pressure Injury 01/12/22 Face Left Stage 2 -  Partial thickness loss of dermis presenting as a shallow open injury with a red, pink wound bed without slough. red ulceration beneath ETT holder on L cheek (Active)  01/12/22 1815  Location: Face  Location Orientation: Left  Staging: Stage 2 -  Partial thickness loss of dermis presenting as a shallow open injury with a red, pink wound bed without slough.  Wound Description (Comments): red ulceration beneath ETT holder on L cheek  Present on Admission: Yes  Dressing Type None 01/22/22 0740     Pressure Injury 01/16/22 Lumbar Left (Active)  01/16/22 1230  Location: Lumbar  Location Orientation: Left  Staging:   Wound Description (Comments):   Present on Admission: Yes  Dressing Type None 01/19/22 2000     Pressure Injury 01/16/22 Buttocks Left (Active)  01/16/22 1230  Location: Buttocks  Location Orientation: Left  Staging:   Wound Description (Comments):   Present on Admission: Yes  Dressing Type None 01/19/22 2000   DVT prophylaxis:  Place and maintain sequential compression device Start: 01/20/22 0901  Code  Status: DNR/DNI Family Communication: None at bedside Level of care: Telemetry Medical Status is: Inpatient Remains inpatient appropriate because: Small bowel obstruction, acute respiratory failure with hypoxia, fluid overload and acute kidney failure   Final disposition: Likely SNF once medically cleared. Consultants:  Pulmonology Nephrology Cardiology  Sch Meds:  Scheduled Meds:  amiodarone  400 mg Oral Daily   Followed by   Derrill Memo ON 01/27/2022] amiodarone  200 mg  Oral Daily   Chlorhexidine Gluconate Cloth  6 each Topical Q0600   Chlorhexidine Gluconate Cloth  6 each Topical Q0600   escitalopram  10 mg Oral Daily   feeding supplement (NEPRO CARB STEADY)  237 mL Oral BID BM   ferrous sulfate  325 mg Oral Q breakfast   folic acid  1 mg Oral Daily   guaiFENesin  600 mg Oral BID   metoprolol tartrate  12.5 mg Oral BID   multivitamin with minerals  1 tablet Oral Daily   sodium chloride flush  10-40 mL Intracatheter Q12H   sodium chloride flush  3 mL Intravenous Q12H   Continuous Infusions:  sodium chloride Stopped (01/16/22 1822)   sodium chloride Stopped (01/15/22 1255)   ferric gluconate (FERRLECIT) IVPB     heparin 1,850 Units/hr (43/15/40 0867)   TPN CYCLIC-ADULT (ION)     PRN Meds:.sodium chloride, acetaminophen, albuterol, alteplase, guaiFENesin-dextromethorphan, lip balm, [DISCONTINUED] ondansetron **OR** ondansetron (ZOFRAN) IV, mouth rinse, oxyCODONE, sodium chloride flush, sodium chloride flush  Antimicrobials: Anti-infectives (From admission, onward)    Start     Dose/Rate Route Frequency Ordered Stop   01/16/22 2000  aztreonam (AZACTAM) 1 g in sodium chloride 0.9 % 100 mL IVPB  Status:  Discontinued        1 g 200 mL/hr over 30 Minutes Intravenous Every 8 hours 01/16/22 1840 01/20/22 1254   01/16/22 2000  metroNIDAZOLE (FLAGYL) IVPB 500 mg  Status:  Discontinued        500 mg 100 mL/hr over 60 Minutes Intravenous Every 8 hours 01/16/22 1840 01/20/22 1254   01/15/22 0526  aztreonam (AZACTAM) 1 g in sodium chloride 0.9 % 100 mL IVPB  Status:  Discontinued       See Hyperspace for full Linked Orders Report.   1 g 200 mL/hr over 30 Minutes Intravenous Every 24 hours 01/14/22 1529 01/14/22 1534   01/15/22 0526  aztreonam (AZACTAM) 1 g in sodium chloride 0.9 % 100 mL IVPB       See Hyperspace for full Linked Orders Report.   1 g 200 mL/hr over 30 Minutes Intravenous Every 24 hours 01/14/22 1534 01/15/22 0504   01/13/22 2100  vancomycin  (VANCOCIN) IVPB 1000 mg/200 mL premix  Status:  Discontinued        1,000 mg 200 mL/hr over 60 Minutes Intravenous Every 24 hours 01/12/22 2135 01/13/22 1027   01/13/22 0500  aztreonam (AZACTAM) 2 g in sodium chloride 0.9 % 100 mL IVPB  Status:  Discontinued       See Hyperspace for full Linked Orders Report.   2 g 200 mL/hr over 30 Minutes Intravenous Every 12 hours 01/12/22 2135 01/14/22 1529   01/12/22 2015  vancomycin (VANCOREADY) IVPB 1750 mg/350 mL        1,750 mg 175 mL/hr over 120 Minutes Intravenous  Once 01/12/22 1921 01/12/22 2259   01/11/22 1800  aztreonam (AZACTAM) 1 g in sodium chloride 0.9 % 100 mL IVPB  Status:  Discontinued       See Hyperspace for full Linked Orders Report.  1 g 200 mL/hr over 30 Minutes Intravenous Every 8 hours 01/11/22 0815 01/12/22 2135   01/11/22 1000  levofloxacin (LEVAQUIN) IVPB 500 mg  Status:  Discontinued        500 mg 100 mL/hr over 60 Minutes Intravenous Every 48 hours 01/10/22 1432 01/11/22 0744   01/11/22 0915  aztreonam (AZACTAM) 2 g in sodium chloride 0.9 % 100 mL IVPB       See Hyperspace for full Linked Orders Report.   2 g 200 mL/hr over 30 Minutes Intravenous  Once 01/11/22 0815 01/11/22 1045   01/11/22 0845  aztreonam (AZACTAM) 2 g in sodium chloride 0.9 % 100 mL IVPB  Status:  Discontinued        2 g 200 mL/hr over 30 Minutes Intravenous  Once 01/11/22 0747 01/11/22 0815   01/11/22 0845  metroNIDAZOLE (FLAGYL) IVPB 500 mg  Status:  Discontinued        500 mg 100 mL/hr over 60 Minutes Intravenous Every 12 hours 01/11/22 0747 01/15/22 0851   01/10/22 1900  levofloxacin (LEVAQUIN) IVPB 500 mg  Status:  Discontinued        500 mg 100 mL/hr over 60 Minutes Intravenous Every 24 hours 01/10/22 1426 01/10/22 1432   01/10/22 0700  ciprofloxacin (CIPRO) IVPB 400 mg        400 mg 200 mL/hr over 60 Minutes Intravenous On call to O.R. 01/10/22 0612 01/10/22 1052   01/10/22 0700  metroNIDAZOLE (FLAGYL) IVPB 500 mg  Status:  Discontinued         500 mg 100 mL/hr over 60 Minutes Intravenous On call to O.R. 01/10/22 0612 01/10/22 1123   01/08/22 0830  Levofloxacin (LEVAQUIN) IVPB 250 mg  Status:  Discontinued        250 mg 50 mL/hr over 60 Minutes Intravenous Every 24 hours 01/08/22 0724 01/08/22 0727   01/08/22 0830  Levofloxacin (LEVAQUIN) IVPB 250 mg  Status:  Discontinued        250 mg 50 mL/hr over 60 Minutes Intravenous Every 48 hours 01/08/22 0727 01/08/22 0732   01/08/22 0830  Levofloxacin (LEVAQUIN) IVPB 250 mg  Status:  Discontinued        250 mg 50 mL/hr over 60 Minutes Intravenous Every 24 hours 01/08/22 0732 01/10/22 1426        I have personally reviewed the following labs and images: CBC: Recent Labs  Lab 01/19/22 0730 01/20/22 0343 01/21/22 0645 01/22/22 0429 01/23/22 0637 01/24/22 0500  WBC 17.2* 14.3* 14.3*  --  12.1* 11.2*  HGB 7.4* 7.2* 7.3* 7.1* 7.4* 7.6*  HCT 22.9* 23.1* 22.6* 22.2* 22.8* 23.8*  MCV 87.7 89.5 87.6  --  88.4 89.1  PLT 306 354 417*  --  454* 532*   BMP &GFR Recent Labs  Lab 01/20/22 0343 01/21/22 0645 01/22/22 0415 01/22/22 0814 01/23/22 0637 01/24/22 0500  NA 129* 131*  --  134* 134* 135  K 3.9 3.7  --  3.8 3.7 3.8  CL 97* 96*  --  99 99 100  CO2 25 26  --  '28 26 27  '$ GLUCOSE 110* 106*  --  145* 119* 105*  BUN 60* 77*  --  59* 69* 78*  CREATININE 2.43* 2.73*  --  2.14* 2.48* 2.74*  CALCIUM 7.5* 7.7*  --  7.8* 7.9* 8.2*  MG 2.0 2.1 1.9  --  2.1 2.3  PHOS 3.8 4.4  --  3.7 3.6 4.3   Estimated Creatinine Clearance: 21.5 mL/min (A) (by C-G formula  based on SCr of 2.74 mg/dL (H)). Liver & Pancreas: Recent Labs  Lab 01/21/22 0645 01/22/22 0814 01/23/22 0637 01/24/22 0500  AST 16  --   --  15  ALT 16  --   --  15  ALKPHOS 93  --   --  88  BILITOT 0.3  --   --  0.6  PROT 4.7*  --   --  5.1*  ALBUMIN 1.7* 1.7* 1.7* 1.8*   No results for input(s): "LIPASE", "AMYLASE" in the last 168 hours. No results for input(s): "AMMONIA" in the last 168 hours. Diabetic: No  results for input(s): "HGBA1C" in the last 72 hours. Recent Labs  Lab 01/23/22 0627 01/23/22 1154 01/23/22 2040 01/24/22 0023 01/24/22 0623  GLUCAP 114* 108* 101* 98 105*   Cardiac Enzymes: No results for input(s): "CKTOTAL", "CKMB", "CKMBINDEX", "TROPONINI" in the last 168 hours. No results for input(s): "PROBNP" in the last 8760 hours. Coagulation Profile: No results for input(s): "INR", "PROTIME" in the last 168 hours. Thyroid Function Tests: No results for input(s): "TSH", "T4TOTAL", "FREET4", "T3FREE", "THYROIDAB" in the last 72 hours. Lipid Profile: Recent Labs    01/24/22 0500  TRIG 35   Anemia Panel: Recent Labs    01/24/22 0500  VITAMINB12 981*  FOLATE 11.8  FERRITIN 111  TIBC 262  IRON 24*  RETICCTPCT 3.4*   Urine analysis:    Component Value Date/Time   COLORURINE YELLOW 01/11/2022 1051   APPEARANCEUR CLOUDY (A) 01/11/2022 1051   APPEARANCEUR Clear 03/01/2021 1257   LABSPEC 1.010 01/11/2022 1051   PHURINE 5.0 01/11/2022 1051   GLUCOSEU NEGATIVE 01/11/2022 1051   HGBUR SMALL (A) 01/11/2022 1051   BILIRUBINUR NEGATIVE 01/11/2022 1051   BILIRUBINUR Negative 03/01/2021 [place] 01/11/2022 1051   PROTEINUR NEGATIVE 01/11/2022 1051   NITRITE NEGATIVE 01/11/2022 1051   LEUKOCYTESUR LARGE (A) 01/11/2022 1051   Sepsis Labs: Invalid input(s): "PROCALCITONIN", "LACTICIDVEN"  Microbiology: Recent Results (from the past 240 hour(s))  Gastrointestinal Panel by PCR , Stool     Status: Abnormal   Collection Time: 01/18/22  3:42 PM   Specimen: Stool  Result Value Ref Range Status   Campylobacter species NOT DETECTED NOT DETECTED Final   Plesimonas shigelloides NOT DETECTED NOT DETECTED Final   Salmonella species NOT DETECTED NOT DETECTED Final   Yersinia enterocolitica NOT DETECTED NOT DETECTED Final   Vibrio species NOT DETECTED NOT DETECTED Final   Vibrio cholerae NOT DETECTED NOT DETECTED Final   Enteroaggregative E coli (EAEC) NOT  DETECTED NOT DETECTED Final   Enterotoxigenic E coli (ETEC) NOT DETECTED NOT DETECTED Final   Shiga like toxin producing E coli (STEC) DETECTED (A) NOT DETECTED Final    Comment: RESULT CALLED TO, READ BACK BY AND VERIFIED WITH: NAA DORMON 01/20/22 1417 AMK    E. coli O157 NOT DETECTED NOT DETECTED Final   Shigella/Enteroinvasive E coli (EIEC) NOT DETECTED NOT DETECTED Final   Cryptosporidium NOT DETECTED NOT DETECTED Final   Cyclospora cayetanensis NOT DETECTED NOT DETECTED Final   Entamoeba histolytica NOT DETECTED NOT DETECTED Final   Giardia lamblia NOT DETECTED NOT DETECTED Final   Adenovirus F40/41 NOT DETECTED NOT DETECTED Final   Astrovirus NOT DETECTED NOT DETECTED Final   Norovirus GI/GII NOT DETECTED NOT DETECTED Final   Rotavirus A NOT DETECTED NOT DETECTED Final   Sapovirus (I, II, IV, and V) NOT DETECTED NOT DETECTED Final    Comment: Performed at [facility], [address] [place], [place]  [address]  C Difficile Quick Screen (NO PCR Reflex)     Status: None   Collection Time: 01/19/22  6:32 PM   Specimen: STOOL  Result Value Ref Range Status   C Diff antigen NEGATIVE NEGATIVE Final   C Diff toxin NEGATIVE NEGATIVE Final   C Diff interpretation No C. difficile detected.  Final    Comment: Performed at [facility], [place] [address] [place], [place] [address]    Radiology Studies: No results found.    Benisha Hadaway T. [place]  If 7PM-7AM, please contact night-coverage [email or web address] 01/24/2022, 2:20 PM

## 2022-01-24 NOTE — Progress Notes (Signed)
Physical Therapy Treatment Patient Details Name: [name] MRN: [id number] DOB: Oct 20, 1945 Today's Date: 01/24/2022   History of Present Illness 76 yo female admitted 8/14 to [facility] with vomiting and diarrhea with partial SBO. 8/17 intubated and ex lap. 8/19 pt with worsening renal function, transfer to [facility] and CRRT started. 8/21 extubation with CRRT off. PMHx: COPD, HLD, HTN, PE, hypothyroidism, OA and depression    PT Comments    Seen for checking lymphedema wraps and for UE therex.  Patient noting L UE feels heavier and noted increased upper arm edema on R though BP cuff on forearm which removed.  Patient anxious about fluid as needing to return to HD today.  Encouraged her it will help.  PT will continue to follow and plan to re-measure and wrap again tomorrow.    Recommendations for follow up therapy are one component of a multi-disciplinary discharge planning process, led by the attending physician.  Recommendations may be updated based on patient status, additional functional criteria and insurance authorization.  Follow Up Recommendations  Skilled nursing-short term rehab (<3 hours/day) Can patient physically be transported by private vehicle: No   Assistance Recommended at Discharge Frequent or constant Supervision/Assistance  Patient can return home with the following Two people to help with walking and/or transfers;Direct supervision/assist for medications management;Two people to help with bathing/dressing/bathroom;Assist for transportation;Direct supervision/assist for financial management   Equipment Recommendations  Other (comment);Hospital bed    Recommendations for Other Services       Precautions / Restrictions Precautions Precautions: Fall Precaution Comments: enteric precautions     Mobility  Bed Mobility               General bed mobility comments: seen for wrap check and mobilizing UE's    Transfers                         Ambulation/Gait                   Stairs             Wheelchair Mobility    Modified Rankin (Stroke Patients Only)       Balance                                            Cognition Arousal/Alertness: Awake/alert Behavior During Therapy: WFL for tasks assessed/performed, Anxious Overall Cognitive Status: Impaired/Different from baseline Area of Impairment: Memory, Attention, Following commands, Safety/judgement, Problem solving                   Current Attention Level: Sustained Memory: Decreased short-term memory Following Commands: Follows one step commands consistently, Follows one step commands with increased time Safety/Judgement: Decreased awareness of deficits   Problem Solving: Slow processing General Comments: anxious today and reports worried about "the fluid" states has to return to dialysis.        Exercises General Exercises - Upper Extremity Shoulder Flexion: AROM, AAROM, Both, 5 reps, Supine Shoulder Horizontal ADduction: AAROM, Left, 5 reps, Supine Elbow Extension: AROM, Both, 5 reps, Supine Wrist Flexion: AROM, Both, 5 reps, Supine Wrist Extension: AROM, Both, 5 reps, Supine Digit Composite Flexion: AROM, Both, 5 reps, Supine Composite Extension: AROM, Both, 5 reps, Supine    General Comments General comments (skin integrity, edema, etc.): noted lymphedema wrapping intact to L UE and noting some areas  looser than others.  Denies pain but does report feels heavier.  Educated plan to re measure and wrap tomorrow with hopes for improvement after dialysis today.      Pertinent Vitals/Pain Pain Assessment Faces Pain Scale: Hurts a little bit Pain Location: generalized with movement Pain Descriptors / Indicators: Grimacing Pain Intervention(s): Monitored during session, Limited activity within patient's tolerance    Home Living                          Prior Function            PT Goals  (current goals can now be found in the care plan section) Progress towards PT goals: Progressing toward goals    Frequency    Min 3X/week      PT Plan Current plan remains appropriate    Co-evaluation              AM-PAC PT "6 Clicks" Mobility   Outcome Measure  Help needed turning from your back to your side while in a flat bed without using bedrails?: A Lot Help needed moving from lying on your back to sitting on the side of a flat bed without using bedrails?: Total Help needed moving to and from a bed to a chair (including a wheelchair)?: Total Help needed standing up from a chair using your arms (e.g., wheelchair or bedside chair)?: Total Help needed to walk in hospital room?: Total Help needed climbing 3-5 steps with a railing? : Total 6 Click Score: 7    End of Session Equipment Utilized During Treatment: Oxygen Activity Tolerance: Patient tolerated treatment well Patient left: in bed;with call bell/phone within reach Nurse Communication: Other (comment) (assist for cleaning due to BM) PT Visit Diagnosis: Other abnormalities of gait and mobility (R26.89);Difficulty in walking, not elsewhere classified (R26.2);Muscle weakness (generalized) (M62.81)     Time: 1300-1308 PT Time Calculation (min) (ACUTE ONLY): 8 min  Charges:  $Therapeutic Exercise: 8-22 mins                     Magda Kiel, PT Acute Rehabilitation Services Office:[phone] 01/24/2022    [name] 01/24/2022, 1:22 PM

## 2022-01-24 NOTE — NC FL2 (Signed)
[place] MEDICAID FL2 LEVEL OF CARE SCREENING TOOL     IDENTIFICATION  Patient Name: [name] Birthdate: [birth year] Sex: female Admission Date (Current Location): 01/07/2022  [facility] and [place] Number:  Herbalist and Address:  [facility], [place] [address], [place], [place] [address]      Provider Number: [id number]  Attending Physician Name and Address:  Mercy Riding, MD  Relative Name and Phone Number:  Larkin Ina [phone]    Current Level of Care: Hospital Recommended Level of Care: [place] Prior Approval Number:    Date Approved/Denied:   PASRR Number: [id number]  Discharge Plan: SNF    Current Diagnoses: Patient Active Problem List   Diagnosis Date Noted   Malnutrition of moderate degree 01/14/2022   Respiratory failure requiring intubation ([place]) 01/11/2022   Acute respiratory failure with hypoxia and hypercapnia ([place]) 01/10/2022   Septic shock ([place]) 01/10/2022   Pressure injury of skin 01/10/2022   AKI (acute kidney injury) ([place]) 01/09/2022   Small bowel obstruction ([place]) 01/07/2022   Abnormal weight loss 11/28/2021   Family history of malignant neoplasm of digestive organs 11/28/2021   Intestinal malabsorption 11/28/2021   Renal insufficiency 12/08/2020   Degeneration of lumbar intervertebral disc 01/07/2020   Venous (peripheral) insufficiency 09/30/2019   Anemia 08/12/2017   Vitamin D deficiency 08/12/2017   Lumbar spondylosis with myelopathy 06/03/2017   Primary osteoarthritis of both knees 03/19/2017   Anxiety 02/17/2017   History of pulmonary embolus (PE) 07/09/2016   COPD (chronic obstructive pulmonary disease) ([place]) 01/18/2016   Hyperlipidemia 01/18/2016   Depression 01/18/2016   Essential hypertension 01/18/2016   H/O spinal fusion 01/18/2016   Hypothyroidism (acquired) 01/18/2016   Thoracic aorta atherosclerosis ([place]) 01/18/2016   Chronic cholecystitis with calculus 10/04/2013     Orientation RESPIRATION BLADDER Height & Weight     Self, Place  O2 (Nasal cannula 2 liters) Incontinent (External Urinary Catheter) Weight: 242 lb 8.1 oz (110 kg) Height:  '5\' 4"'$  (162.6 cm)  BEHAVIORAL SYMPTOMS/MOOD NEUROLOGICAL BOWEL NUTRITION STATUS      Incontinent Diet (Please see discharge summary)  AMBULATORY STATUS COMMUNICATION OF NEEDS Skin   Extensive Assist Verbally Other (Comment) (pale,pink,clammy,ecchymosis,arm,hand,R,L,bil.,erythema sacrum,R,L,Abrasion,flank,L,cleansed,PI coccyx medial,mid stage 2,foam lift dressing,clean,dry,intact,PRN,PI face,L,stage 2,hydrocolloid,clean,dry,intact,PRN,Please see additional info)                       Personal Care Assistance Level of Assistance  Bathing, Feeding, Dressing Bathing Assistance: Maximum assistance Feeding assistance: Independent (able to feed self) Dressing Assistance: Maximum assistance     Functional Limitations Info  Sight, Hearing, Speech Sight Info: Impaired Hearing Info: Impaired Speech Info: Impaired (Difficulty speaking)    SPECIAL CARE FACTORS FREQUENCY  PT (By licensed PT), OT (By licensed OT)     PT Frequency: 5x min weekly OT Frequency: 5x min weekly            Contractures Contractures Info: Not present    Additional Factors Info  Code Status, Allergies, Psychotropic Code Status Info: FULL Allergies Info: Celebrex (celecoxib),Keflet (cephalexin),Penicillins,Sulfa Antibiotics,Kenalog (triamcinolone Acetonide),Latex,Lisinopril,Mobic (meloxicam) Psychotropic Info: escitalopram (LEXAPRO) tablet 10 mg daily         Current Medications (01/24/2022):  This is the current hospital active medication list Current Facility-Administered Medications  Medication Dose Route Frequency Provider Last Rate Last Admin   0.9 %  sodium chloride infusion  250 mL Intravenous PRN Deatra James, MD   Stopped at 01/16/22 1822   0.9 %  sodium  chloride infusion  250 mL Intravenous Continuous Deatra James, MD   Stopped at 01/15/22 1255   acetaminophen (TYLENOL) tablet 650 mg  650 mg Oral Q6H PRN Regalado, Belkys A, MD   650 mg at 01/24/22 0154   albuterol (PROVENTIL) (2.5 MG/3ML) 0.083% nebulizer solution 2.5 mg  2.5 mg Nebulization Q6H PRN Masters, Katie, DO   2.5 mg at 01/19/22 2256   alteplase (CATHFLO ACTIVASE) injection 2 mg  2 mg Intracatheter Once PRN Masters, Katie, DO       amiodarone (PACERONE) tablet 400 mg  400 mg Oral Daily Vickie Epley, MD   400 mg at 01/24/22 2694   Followed by   Derrill Memo ON 01/27/2022] amiodarone (PACERONE) tablet 200 mg  200 mg Oral Daily Vickie Epley, MD       Chlorhexidine Gluconate Cloth 2 % PADS 6 each  6 each Topical Q0600 Rexene Agent, MD   6 each at 01/24/22 0600   Chlorhexidine Gluconate Cloth 2 % PADS 6 each  6 each Topical Q0600 Rosita Fire, MD       escitalopram (LEXAPRO) tablet 10 mg  10 mg Oral Daily Regalado, Belkys A, MD   10 mg at 01/24/22 0948   feeding supplement (NEPRO CARB STEADY) liquid 237 mL  237 mL Oral BID BM Regalado, Belkys A, MD   237 mL at 01/23/22 1404   ferric gluconate (FERRLECIT) 250 mg in sodium chloride 0.9 % 250 mL IVPB  250 mg Intravenous Daily Rosita Fire, MD       ferrous sulfate tablet 325 mg  325 mg Oral Q breakfast Regalado, Belkys A, MD   325 mg at 85/46/27 0350   folic acid (FOLVITE) tablet 1 mg  1 mg Oral Daily Regalado, Belkys A, MD   1 mg at 01/24/22 0949   guaiFENesin (MUCINEX) 12 hr tablet 600 mg  600 mg Oral BID Regalado, Belkys A, MD   600 mg at 01/24/22 0949   guaiFENesin-dextromethorphan (ROBITUSSIN DM) 100-10 MG/5ML syrup 5 mL  5 mL Oral Q4H PRN Florencia Reasons, MD   5 mL at 01/23/22 2137   heparin ADULT infusion 100 units/mL (25000 units/275m)  1,850 Units/hr Intravenous Continuous Masters, Katie, DO 18.5 mL/hr at 01/24/22 1158 1,850 Units/hr at 01/24/22 1158   lip balm (CARMEX) ointment   Topical PRN Masters, KJoellen Jersey DO       metoprolol tartrate (LOPRESSOR) tablet 12.5 mg  12.5  mg Oral BID CGasper Sells Mahesh A, MD   12.5 mg at 01/23/22 2137   multivitamin with minerals tablet 1 tablet  1 tablet Oral Daily Bell, Lorin C, RPH   1 tablet at 01/24/22 1200   ondansetron (ZOFRAN) injection 4 mg  4 mg Intravenous Q6H PRN SSkipper ClicheA, MD   4 mg at 01/23/22 1033   Oral care mouth rinse  15 mL Mouth Rinse PRN Masters, KJoellen Jersey DO       oxyCODONE (Oxy IR/ROXICODONE) immediate release tablet 5 mg  5 mg Oral Q6H PRN Regalado, Belkys A, MD   5 mg at 01/24/22 0155   sodium chloride flush (NS) 0.9 % injection 10-40 mL  10-40 mL Intracatheter Q12H Masters, Katie, DO   10 mL at 01/23/22 2137   sodium chloride flush (NS) 0.9 % injection 10-40 mL  10-40 mL Intracatheter PRN Masters, KJoellen Jersey DO       sodium chloride flush (NS) 0.9 % injection 3 mL  3 mL Intravenous Q12H Shahmehdi, SValeria Batman MD  3 mL at 01/23/22 2138   sodium chloride flush (NS) 0.9 % injection 3 mL  3 mL Intravenous PRN Skipper Cliche A, MD   3 mL at 84/85/92 7639   TPN CYCLIC-ADULT (ION)   Intravenous Cyclic-See Admin Instructions Merrilee Jansky, [facility]         Discharge Medications: Please see discharge summary for a list of discharge medications.  Relevant Imaging Results:  Relevant Lab Results:   Additional Information [phone]  Milas Gain, LCSWA

## 2022-01-24 NOTE — TOC Progression Note (Signed)
Transition of Care [facility]) - Progression Note    Patient Details  Name: [name] MRN: [id number] Date of Birth: 08/14/1945  Transition of Care [facility]) CM/SW [place], [place] Phone Number: 01/24/2022, 2:37 PM  Clinical Narrative:     CSW faxed out patient for SNF. CSW awaiting SNF bed offers. CSW will provide SNF bed offers to patient when available. CSW will continue to follow and assist with patients dc planning needs.  Expected Discharge Plan: Home/Self Care Barriers to Discharge: Continued Medical Work up  Expected Discharge Plan and Services Expected Discharge Plan: Home/Self Care                                               Social Determinants of Health (SDOH) Interventions    Readmission Risk Interventions     No data to display

## 2022-01-24 NOTE — Progress Notes (Addendum)
[facility] NEPHROLOGY PROGRESS NOTE  Assessment/ Plan: Pt is a 76 y.o. yo female with history of HTN, HLD, hypothyroidism, PE, presented with SBO underwent emergent ex lap with lysis of adhesion on 8/17, developed anterior dependent respiratory failure, AKI requiring CRRT.  #Acute kidney injury, oligoanuric, dialysis dependent: Likely ischemic ATN from shock/sepsis.  She was on ARB prior to admission.  Initially required CRRT which was stopped on 8/21 and initiated IHD on 8/23.  Seen by palliative care team. Last HD on 8/28. Unfortunately no urine output is not measured as he is incontinent.  She is now requiring oxygen and exam consistent with fluid overload.  Both BUN and creatinine level is trending up.  We will plan to do dialysis today, discussed with the nursing staffs. Continue to monitor renal recovery.  #Septic shock, off of pressors.  Blood pressure improving.  #High-grade small bowel obstruction now status post ex lap and lysis of adhesion.  On TPN.  General surgery team is following.  #Hyponatremia, hypervolemic: Recommend to minimize free water.  #Anemia of critical illness: Received a unit of blood transfusion on 8/29.  Iron saturation is only 9 with anemia.  I will order IV iron.    Subjective: Seen and examined.  No urine output measured as he is incontinent.  Requiring around 2 L of oxygen.  She however denies chest pain or shortness of breath.  The review of system is limited.  Objective Vital signs in last 24 hours: Vitals:   01/23/22 1455 01/23/22 1940 01/24/22 0430 01/24/22 0628  BP: 116/71 (!) 119/59 128/77 119/76  Pulse: 74 69 66 79  Resp: '15 19 15 16  '$ Temp: 98.3 F (36.8 C) 98 F (36.7 C) 97.8 F (36.6 C) 97.9 F (36.6 C)  TempSrc: Oral Oral Oral Oral  SpO2: 99% 100% 96% 97%  Weight:    110 kg  Height:       Weight change: 0.955 kg  Intake/Output Summary (Last 24 hours) at 01/24/2022 1036 Last data filed at 01/24/2022 0158 Gross per 24  hour  Intake 1309.89 ml  Output --  Net 1309.89 ml        Labs: RENAL PANEL Recent Labs    01/13/22 0448 01/13/22 1521 01/14/22 0400 01/14/22 1615 01/15/22 0431 01/16/22 0627 01/17/22 0115 01/17/22 0536 01/17/22 1100 01/18/22 0707 01/18/22 1145 01/19/22 0515 01/20/22 0343 01/21/22 0645 01/22/22 0415 01/22/22 0814 01/23/22 0637 01/24/22 0500  NA 137 137 138 136 138 134* 132*  --  131* 126* 130* 129* 129* 131*  --  134* 134* 135  K 3.5 3.9 3.7 3.6 3.3* 3.3* 2.6*  --  3.1* 3.1* 3.3* 3.6 3.9 3.7  --  3.8 3.7 3.8  CL 110 108 109 107 107 106 102  --  102 102 100 96* 97* 96*  --  99 99 100  CO2 16* 20* 20* '24 24 22 22  '$ --  22 21* '25 24 25 26  '$ --  '28 26 27  '$ GLUCOSE 124* 157* 158* 125* 98 113* 112*  --  120* 112* 145* 121* 110* 106*  --  145* 119* 105*  BUN 61* 43* 31* 23 28* 43* 34*  --  41* 54* 39* 47* 60* 77*  --  59* 69* 78*  CREATININE 3.55* 2.56* 2.08* 1.64* 2.09* 2.75* 2.08*  --  2.33* 2.57* 1.92* 2.19* 2.43* 2.73*  --  2.14* 2.48* 2.74*  CALCIUM 7.7* 7.5* 7.4* 7.3* 7.4* 7.7* 7.1*  --  7.3* 7.1* 7.1* 7.4* 7.5*  7.7*  --  7.8* 7.9* 8.2*  MG 2.3  --  2.4  --  2.1 2.1 1.5*  --  1.9 1.8  --  1.8 2.0 2.1 1.9  --  2.1 2.3  PHOS 2.6 2.3* 1.9* 1.6* 2.0* 1.4* 2.6 2.5  --  3.2  --  3.0 3.8 4.4  --  3.7 3.6 4.3  ALBUMIN 1.9* 1.9* 1.9* 1.8* 1.8* 1.8*  --   --   --   --   --   --   --  1.7*  --  1.7* 1.7* 1.8*      Liver Function Tests: Recent Labs  Lab 01/21/22 0645 01/22/22 0814 01/23/22 0637 01/24/22 0500  AST 16  --   --  15  ALT 16  --   --  15  ALKPHOS 93  --   --  88  BILITOT 0.3  --   --  0.6  PROT 4.7*  --   --  5.1*  ALBUMIN 1.7* 1.7* 1.7* 1.8*    No results for input(s): "LIPASE", "AMYLASE" in the last 168 hours. No results for input(s): "AMMONIA" in the last 168 hours. CBC: Recent Labs    01/20/22 0343 01/21/22 0645 01/22/22 0429 01/23/22 0637 01/24/22 0500  HGB 7.2* 7.3* 7.1* 7.4* 7.6*  MCV 89.5 87.6  --  88.4 89.1  VITAMINB12  --   --   --   --   981*  FOLATE  --   --   --   --  11.8  FERRITIN  --   --   --   --  111  TIBC  --   --   --   --  262  IRON  --   --   --   --  24*  RETICCTPCT  --   --   --   --  3.4*     Cardiac Enzymes: No results for input(s): "CKTOTAL", "CKMB", "CKMBINDEX", "TROPONINI" in the last 168 hours. CBG: Recent Labs  Lab 01/23/22 0627 01/23/22 1154 01/23/22 2040 01/24/22 0023 01/24/22 0623  GLUCAP 114* 108* 101* 98 105*     Iron Studies:  Recent Labs    01/24/22 0500  IRON 24*  TIBC 262  FERRITIN 111   Studies/Results: No results found.  Medications: Infusions:  sodium chloride Stopped (01/16/22 1822)   sodium chloride Stopped (01/15/22 1255)   heparin 1,850 Units/hr (88/50/27 7412)   TPN CYCLIC-ADULT (ION) 46 mL/hr at 87/86/76 7209   TPN CYCLIC-ADULT (ION)      Scheduled Medications:  amiodarone  400 mg Oral Daily   Followed by   Derrill Memo ON 01/27/2022] amiodarone  200 mg Oral Daily   Chlorhexidine Gluconate Cloth  6 each Topical Q0600   Chlorhexidine Gluconate Cloth  6 each Topical Q0600   escitalopram  10 mg Oral Daily   feeding supplement (NEPRO CARB STEADY)  237 mL Oral BID BM   ferrous sulfate  325 mg Oral Q breakfast   folic acid  1 mg Oral Daily   guaiFENesin  600 mg Oral BID   metoprolol tartrate  12.5 mg Oral BID   multivitamin with minerals  1 tablet Oral Daily   sodium chloride flush  10-40 mL Intracatheter Q12H   sodium chloride flush  3 mL Intravenous Q12H    have reviewed scheduled and prn medications.  Physical Exam: General:  not in distress Heart:RRR, s1s2 nl Lungs: Basal degree breath sound, no wheezing Abdomen:soft, Non-tender, non-distended Extremities: Upper extremities edema  noted, trace leg edema + Dialysis Access: Left IJ temporary HD catheter in place  Jashua Knaak Prasad Kyce Ging 01/24/2022,10:36 AM  LOS: 17 days

## 2022-01-25 DIAGNOSIS — K56609 Unspecified intestinal obstruction, unspecified as to partial versus complete obstruction: Secondary | ICD-10-CM | POA: Diagnosis not present

## 2022-01-25 DIAGNOSIS — Z515 Encounter for palliative care: Secondary | ICD-10-CM | POA: Diagnosis not present

## 2022-01-25 DIAGNOSIS — J9690 Respiratory failure, unspecified, unspecified whether with hypoxia or hypercapnia: Secondary | ICD-10-CM | POA: Diagnosis not present

## 2022-01-25 DIAGNOSIS — A419 Sepsis, unspecified organism: Secondary | ICD-10-CM | POA: Diagnosis not present

## 2022-01-25 DIAGNOSIS — J449 Chronic obstructive pulmonary disease, unspecified: Secondary | ICD-10-CM | POA: Diagnosis not present

## 2022-01-25 DIAGNOSIS — Z7189 Other specified counseling: Secondary | ICD-10-CM | POA: Diagnosis not present

## 2022-01-25 LAB — BASIC METABOLIC PANEL
Anion gap: 7 (ref 5–15)
BUN: 47 mg/dL — ABNORMAL HIGH (ref 8–23)
CO2: 30 mmol/L (ref 22–32)
Calcium: 8 mg/dL — ABNORMAL LOW (ref 8.9–10.3)
Chloride: 99 mmol/L (ref 98–111)
Creatinine, Ser: 1.94 mg/dL — ABNORMAL HIGH (ref 0.44–1.00)
GFR, Estimated: 27 mL/min — ABNORMAL LOW (ref >60)
Glucose, Bld: 105 mg/dL — ABNORMAL HIGH (ref 70–99)
Potassium: 3.6 mmol/L (ref 3.5–5.1)
Sodium: 136 mmol/L (ref 135–145)

## 2022-01-25 LAB — GLUCOSE, CAPILLARY
Glucose-Capillary: 100 mg/dL — ABNORMAL HIGH (ref 70–99)
Glucose-Capillary: 119 mg/dL — ABNORMAL HIGH (ref 70–99)
Glucose-Capillary: 119 mg/dL — ABNORMAL HIGH (ref 70–99)
Glucose-Capillary: 121 mg/dL — ABNORMAL HIGH (ref 70–99)
Glucose-Capillary: 146 mg/dL — ABNORMAL HIGH (ref 70–99)

## 2022-01-25 LAB — CBC
HCT: 22.5 % — ABNORMAL LOW (ref 36.0–46.0)
Hemoglobin: 7.2 g/dL — ABNORMAL LOW (ref 12.0–15.0)
MCH: 28.7 pg (ref 26.0–34.0)
MCHC: 32 g/dL (ref 30.0–36.0)
MCV: 89.6 fL (ref 80.0–100.0)
Platelets: 541 10*3/uL — ABNORMAL HIGH (ref 150–400)
RBC: 2.51 MIL/uL — ABNORMAL LOW (ref 3.87–5.11)
RDW: 17.2 % — ABNORMAL HIGH (ref 11.5–15.5)
WBC: 9.1 10*3/uL (ref 4.0–10.5)
nRBC: 0 % (ref 0.0–0.2)

## 2022-01-25 LAB — HEPARIN LEVEL (UNFRACTIONATED)
Heparin Unfractionated: 0.79 IU/mL — ABNORMAL HIGH (ref 0.30–0.70)
Heparin Unfractionated: 0.69 IU/mL (ref 0.30–0.70)

## 2022-01-25 LAB — MAGNESIUM: Magnesium: 2 mg/dL (ref 1.7–2.4)

## 2022-01-25 MED ORDER — STERILE WATER FOR INJECTION IV SOLN
INTRAVENOUS | Status: AC
  Filled 2022-01-25: qty 390

## 2022-01-25 MED ORDER — BUSPIRONE HCL 5 MG PO TABS
5.0000 mg | ORAL_TABLET | Freq: Two times a day (BID) | ORAL | Status: DC
  Administered 2022-01-25 – 2022-01-30 (×10): 5 mg via ORAL
  Filled 2022-01-25 (×10): qty 1

## 2022-01-25 NOTE — Progress Notes (Signed)
ANTICOAGULATION CONSULT NOTE - Follow Up Consult  Pharmacy Consult for heparin Indication: atrial fibrillation  Labs: Recent Labs    01/24/22 0500 01/24/22 1504 01/25/22 0548 01/25/22 1657  HGB 7.6* 7.6* 7.2*  --   HCT 23.8* 24.7* 22.5*  --   PLT 532* 560* 541*  --   HEPARINUNFRC 0.74*  --  0.79* 0.69  CREATININE 2.74* 2.71* 1.94*  --      Assessment: 76yo female on heparin; no infusion issues or signs of bleeding per RN.  HL 0.69 on 9/1 @ 1657- therapeutic   Goal of Therapy:  Heparin level 0.3-0.7 units/ml   Plan:  Will decrease heparin infusion slightly to 1600 units/hr 6hr HL on 9/2 @ 0200  Continue to monitor CBC, s/sx bleeding and transition to oral therapies as able  Wilson Singer, PharmD Clinical Pharmacist 01/25/2022 7:21 PM'

## 2022-01-25 NOTE — Progress Notes (Signed)
   Palliative Medicine Inpatient Follow Up Note HPI: [name]  is a 76 yo female with a PMHx: COPD, HLD, HTN, PE, hypothyroidism, OA and depression. Admitted 8/14 to [facility] with vomiting and diarrhea with partial SBO. 8/17 intubated and ex lap. 8/19 pt with worsening renal function, transfer to [facility] and CRRT started. 8/21 extubation with CRRT off.  Palliative care has been asked to get involved to have goals of care conversations in the setting of a prolonged and complicated hospitalization.  Today's Discussion 01/25/2022  *Please note that this is a verbal dictation therefore any spelling or grammatical errors are due to the "[employer]" system interpretation.  Chart reviewed inclusive of vital signs, progress notes, laboratory results, and diagnostic images.  I met with [name] at bedside this morning. She shares that her hemodialysis has been going well for the most part. She expresses some worsening anxiety though realizes that this is likely related to her present situation, is agreeable to getting something to help support these symptoms in addition to escitalopram. She shares that she is a bit more of an anxious person. Allowed her time to express herself.   Discussed again, the importance of mobility. Patient is ready and willing to start moving if the support is present.   Lymphedema therapy seems to be helping thus far.   Reviewed patients PO's are slowly improving but she needs to take in more in an effort to get off of the TPN. Patient vocalizes difficulty self feeding. Will ask nursing to help with 1:1 feeds.  Goals remain to continue current care in the hopes of improvement(s).   Called patients son - left a VM.   Palliative support provided to the patient.  Objective Assessment: Vital Signs Vitals:   01/25/22 0758 01/25/22 1147  BP: 103/64 121/69  Pulse: 82 77  Resp: 19 20  Temp: 98.2 F (36.8 C) 97.9 F (36.6 C)  SpO2: 97% 98%    Intake/Output Summary (Last  24 hours) at 01/25/2022 1722 Last data filed at 01/24/2022 2103 Gross per 24 hour  Intake 709.64 ml  Output 90 ml  Net 619.64 ml    Last Weight  Most recent update: 01/25/2022  6:44 AM    Weight  107.2 kg (236 lb 5.3 oz)            Gen: Elderly Caucasian female HEENT: moist mucous membranes CV: Regular rate and rhythm  PULM:  On 2LPM [place], breathing is even and nonlabored ABD: soft/nontender  EXT: (+) LE edema  Neuro: Alert and oriented x2  SUMMARY OF RECOMMENDATIONS   DNAR/DNI    Watchful waiting --> For improvements  Generalized weakness --> PT/OT, Needs daily mobility. Has lymphedema machine  Delirium Precautions    Anxiety --> Will add some standing Buspar 68m PO BID can titrate up to symptoms  FTT --> Dietary is involved   Patient's goals are for improvement    Ongoing palliative care support  Billing based on MDM: High ______________________________________________________________________________________ [place] Team Cell Phone: [phone] utilize secure chat with additional questions, if there is no response within 30 minutes please call the above phone number  Palliative Medicine Team providers are available by phone from 7am to 7pm daily and can be reached through the team cell phone.  Should this patient require assistance outside of these hours, please call the patient's attending physician.

## 2022-01-25 NOTE — Progress Notes (Signed)
7 Days Post-Op   Subjective/Chief Complaint: On D3 diet now.  Loved the cream of wheat but hasn't had any again since yesterday and wanting to know if she can get some more.  Otherwise early satiety, minimal appetite.  Drinking about 1/3 of her nepro shakes.  +BM this morning   Objective: Vital signs in last 24 hours: Temp:  [97.9 F (36.6 C)-98.4 F (36.9 C)] 98.2 F (36.8 C) (09/01 0758) Pulse Rate:  [66-83] 82 (09/01 0758) Resp:  [11-19] 19 (09/01 0758) BP: (103-123)/(54-80) 103/64 (09/01 0758) SpO2:  [96 %-100 %] 97 % (09/01 0758) Weight:  [107.2 kg-109.1 kg] 107.2 kg (09/01 0622) Last BM Date : 01/24/22  Intake/Output from previous day: 08/31 0701 - 09/01 0700 In: 709.6 [P.O.:240; I.V.:467.7; IV Piggyback:1.9] Out: 90  Intake/Output this shift: No intake/output data recorded.  PE: Lungs: normal effort on [place] Abd: soft, ND, incision c/d/I with steri-strips in place Extremities: BUE and BLE edema  Lab Results:  Recent Labs    01/24/22 1504 01/25/22 0548  WBC 10.9* 9.1  HGB 7.6* 7.2*  HCT 24.7* 22.5*  PLT 560* 541*   BMET Recent Labs    01/24/22 1504 01/25/22 0548  NA 136 136  K 3.9 3.6  CL 100 99  CO2 28 30  GLUCOSE 95 105*  BUN 80* 47*  CREATININE 2.71* 1.94*  CALCIUM 8.3* 8.0*   PT/INR No results for input(s): "LABPROT", "INR" in the last 72 hours. ABG No results for input(s): "PHART", "HCO3" in the last 72 hours.  Invalid input(s): "PCO2", "PO2"   Studies/Results: No results found.  Anti-infectives: Anti-infectives (From admission, onward)    Start     Dose/Rate Route Frequency Ordered Stop   01/16/22 2000  aztreonam (AZACTAM) 1 g in sodium chloride 0.9 % 100 mL IVPB  Status:  Discontinued        1 g 200 mL/hr over 30 Minutes Intravenous Every 8 hours 01/16/22 1840 01/20/22 1254   01/16/22 2000  metroNIDAZOLE (FLAGYL) IVPB 500 mg  Status:  Discontinued        500 mg 100 mL/hr over 60 Minutes Intravenous Every 8 hours 01/16/22 1840  01/20/22 1254   01/15/22 0526  aztreonam (AZACTAM) 1 g in sodium chloride 0.9 % 100 mL IVPB  Status:  Discontinued       See Hyperspace for full Linked Orders Report.   1 g 200 mL/hr over 30 Minutes Intravenous Every 24 hours 01/14/22 1529 01/14/22 1534   01/15/22 0526  aztreonam (AZACTAM) 1 g in sodium chloride 0.9 % 100 mL IVPB       See Hyperspace for full Linked Orders Report.   1 g 200 mL/hr over 30 Minutes Intravenous Every 24 hours 01/14/22 1534 01/15/22 0504   01/13/22 2100  vancomycin (VANCOCIN) IVPB 1000 mg/200 mL premix  Status:  Discontinued        1,000 mg 200 mL/hr over 60 Minutes Intravenous Every 24 hours 01/12/22 2135 01/13/22 1027   01/13/22 0500  aztreonam (AZACTAM) 2 g in sodium chloride 0.9 % 100 mL IVPB  Status:  Discontinued       See Hyperspace for full Linked Orders Report.   2 g 200 mL/hr over 30 Minutes Intravenous Every 12 hours 01/12/22 2135 01/14/22 1529   01/12/22 2015  vancomycin (VANCOREADY) IVPB 1750 mg/350 mL        1,750 mg 175 mL/hr over 120 Minutes Intravenous  Once 01/12/22 1921 01/12/22 2259   01/11/22 1800  aztreonam (AZACTAM) 1  g in sodium chloride 0.9 % 100 mL IVPB  Status:  Discontinued       See Hyperspace for full Linked Orders Report.   1 g 200 mL/hr over 30 Minutes Intravenous Every 8 hours 01/11/22 0815 01/12/22 2135   01/11/22 1000  levofloxacin (LEVAQUIN) IVPB 500 mg  Status:  Discontinued        500 mg 100 mL/hr over 60 Minutes Intravenous Every 48 hours 01/10/22 1432 01/11/22 0744   01/11/22 0915  aztreonam (AZACTAM) 2 g in sodium chloride 0.9 % 100 mL IVPB       See Hyperspace for full Linked Orders Report.   2 g 200 mL/hr over 30 Minutes Intravenous  Once 01/11/22 0815 01/11/22 1045   01/11/22 0845  aztreonam (AZACTAM) 2 g in sodium chloride 0.9 % 100 mL IVPB  Status:  Discontinued        2 g 200 mL/hr over 30 Minutes Intravenous  Once 01/11/22 0747 01/11/22 0815   01/11/22 0845  metroNIDAZOLE (FLAGYL) IVPB 500 mg  Status:   Discontinued        500 mg 100 mL/hr over 60 Minutes Intravenous Every 12 hours 01/11/22 0747 01/15/22 0851   01/10/22 1900  levofloxacin (LEVAQUIN) IVPB 500 mg  Status:  Discontinued        500 mg 100 mL/hr over 60 Minutes Intravenous Every 24 hours 01/10/22 1426 01/10/22 1432   01/10/22 0700  ciprofloxacin (CIPRO) IVPB 400 mg        400 mg 200 mL/hr over 60 Minutes Intravenous On call to O.R. 01/10/22 0612 01/10/22 1052   01/10/22 0700  metroNIDAZOLE (FLAGYL) IVPB 500 mg  Status:  Discontinued        500 mg 100 mL/hr over 60 Minutes Intravenous On call to O.R. 01/10/22 0612 01/10/22 1123   01/08/22 0830  Levofloxacin (LEVAQUIN) IVPB 250 mg  Status:  Discontinued        250 mg 50 mL/hr over 60 Minutes Intravenous Every 24 hours 01/08/22 0724 01/08/22 0727   01/08/22 0830  Levofloxacin (LEVAQUIN) IVPB 250 mg  Status:  Discontinued        250 mg 50 mL/hr over 60 Minutes Intravenous Every 48 hours 01/08/22 0727 01/08/22 0732   01/08/22 0830  Levofloxacin (LEVAQUIN) IVPB 250 mg  Status:  Discontinued        250 mg 50 mL/hr over 60 Minutes Intravenous Every 24 hours 01/08/22 0732 01/10/22 1426       Assessment/Plan: POD#15 - status post ex lap with lysis of adhesions - Dr. Constance Haw ([facility]) - Afebrile WBC normal.  CT scan negative for intra-abdominal infection.   -diarrhea improving -c diff neg, + for STEC -on D3 diet.  Not eating enough to stop TNA yet.  Continue this at half rate and encourage oral intake and protein shakes.  It will take time for her to regain her appetite.  She does not need to be meeting 100% of her goals prior to discharge, but she does need to be drinking more protein shakes or eating more. -will recheck on her on Monday,  please call sooner if questions arise.  FEN: D3 diet, 1/2 rate TPN, nephro shakes ID: aztreonam 8/18 >>8/22, 8/23 restarted and flagyl, no abx needed from surgery standpoint VTE: hep gtt Foley: purewick in place     Afib - IV hep, rate control  meds per cards AKI - CRRT stopped 8/21, now with IHD ABL anemia - stable Volume overload/pleural effusions - per medicine  Data reviewed - vitals x 24 hrs, nursing note x 24hrs, CCM note, renal note, hospitalist note, and cardiology note, labs, imaging, I/Os   LOS: 18 days    Henreitta Cea PA-C 01/25/2022

## 2022-01-25 NOTE — Progress Notes (Signed)
Physical Therapy Treatment Patient Details Name: [name] MRN: [id number] DOB: December 16, 1945 Today's Date: 01/25/2022   History of Present Illness 76 yo female admitted 8/14 to [facility] with vomiting and diarrhea with partial SBO. 8/17 intubated and ex lap. 8/19 pt with worsening renal function, transfer to [facility] and CRRT started. 8/21 extubation with CRRT off. PMHx: COPD, HLD, HTN, PE, hypothyroidism, OA and depression    PT Comments    Patient with limited tolerance to mobility this session.  Note BP as below, though improved with LE exercises.  Patient continues with loose stools so unable to safely sit up in recliner.  Feel she may need more upright positioning to achieve stable hemodynamics to allow upright progression.  Will consider tilt bed if still having issues next session.  Will follow up for lymphedema management later today.   Orthostatic VS for the past 24 hrs (Last 3 readings):  BP- Lying BP- Sitting  01/25/22 1300 -- 116/69 (after sitting EOB and performing LE exercises)  01/25/22 1257 121/69 (!) 86/79      Recommendations for follow up therapy are one component of a multi-disciplinary discharge planning process, led by the attending physician.  Recommendations may be updated based on patient status, additional functional criteria and insurance authorization.  Follow Up Recommendations  Skilled nursing-short term rehab (<3 hours/day) Can patient physically be transported by private vehicle: No   Assistance Recommended at Discharge Frequent or constant Supervision/Assistance  Patient can return home with the following Two people to help with walking and/or transfers;Direct supervision/assist for medications management;Two people to help with bathing/dressing/bathroom;Assist for transportation;Direct supervision/assist for financial management   Equipment Recommendations  Other (comment);Hospital bed    Recommendations for Other Services       Precautions / Restrictions  Precautions Precautions: Fall Precaution Comments: enteric precautions     Mobility  Bed Mobility Overal bed mobility: Needs Assistance Bed Mobility: Supine to Sit, Sit to Supine     Supine to sit: Max assist, HOB elevated, +2 for safety/equipment, Mod assist Sit to supine: +2 for physical assistance, Max assist   General bed mobility comments: mobilized with increased time as pt initiated scooting hips, but fatigues quickly so assisted for legs off bed and to lift trunk (min A for trunk) and to scoot hips with pt helping; to supine assist for legs and repositioning trunk    Transfers Overall transfer level: Needs assistance   Transfers: Bed to chair/wheelchair/BSC            Lateral/Scoot Transfers: Mod assist, +2 physical assistance General transfer comment: two times assisting to scoot up toward EOB in sitting; assist for anterior weight shift and to move hips on bed pad (did not stand with BP drop and pt symptomatic)    Ambulation/Gait                   Stairs             Wheelchair Mobility    Modified Rankin (Stroke Patients Only)       Balance Overall balance assessment: Needs assistance Sitting-balance support: Feet supported Sitting balance-Leahy Scale: Poor Sitting balance - Comments: UE support on bed rail                                    Cognition Arousal/Alertness: Awake/alert Behavior During Therapy: WFL for tasks assessed/performed, Anxious Overall Cognitive Status: Impaired/Different from baseline Area of Impairment: Memory, Attention,  Following commands                   Current Attention Level: Sustained Memory: Decreased short-term memory Following Commands: Follows one step commands consistently, Follows one step commands with increased time     Problem Solving: Slow processing          Exercises General Exercises - Lower Extremity Ankle Circles/Pumps: AROM, 10 reps, Both, Seated Long Arc  Quad: AROM, Both, 10 reps, Seated    General Comments        Pertinent Vitals/Pain Pain Assessment Faces Pain Scale: Hurts even more Pain Location: lower back Pain Descriptors / Indicators: Grimacing, Aching Pain Intervention(s): Monitored during session, Repositioned    Home Living                          Prior Function            PT Goals (current goals can now be found in the care plan section) Progress towards PT goals: Not progressing toward goals - comment (due to BP drop in sitting)    Frequency    Min 3X/week      PT Plan Current plan remains appropriate    Co-evaluation              AM-PAC PT "6 Clicks" Mobility   Outcome Measure  Help needed turning from your back to your side while in a flat bed without using bedrails?: A Lot Help needed moving from lying on your back to sitting on the side of a flat bed without using bedrails?: Total Help needed moving to and from a bed to a chair (including a wheelchair)?: Total Help needed standing up from a chair using your arms (e.g., wheelchair or bedside chair)?: Total Help needed to walk in hospital room?: Total Help needed climbing 3-5 steps with a railing? : Total 6 Click Score: 7    End of Session Equipment Utilized During Treatment: Gait belt;Oxygen Activity Tolerance: Patient limited by fatigue;Treatment limited secondary to medical complications (Comment) (BP drop in sitting) Patient left: in bed;with call bell/phone within reach   PT Visit Diagnosis: Other abnormalities of gait and mobility (R26.89);Difficulty in walking, not elsewhere classified (R26.2);Muscle weakness (generalized) (M62.81)     Time: 8850-2774 PT Time Calculation (min) (ACUTE ONLY): 18 min  Charges:  $Therapeutic Activity: 8-22 mins                     Magda Kiel, PT Acute Rehabilitation Services Office:[phone] 01/25/2022    [name] 01/25/2022, 4:33 PM

## 2022-01-25 NOTE — Progress Notes (Signed)
[facility] NEPHROLOGY PROGRESS NOTE  Assessment/ Plan: Pt is a 76 y.o. yo female with history of HTN, HLD, hypothyroidism, PE, presented with SBO underwent emergent ex lap with lysis of adhesion on 8/17, developed anterior dependent respiratory failure, AKI requiring CRRT.  #Acute kidney injury, oligoanuric, dialysis dependent: Likely ischemic ATN from shock/sepsis.  She was on ARB prior to admission.  Initially required CRRT which was stopped on 8/21 and initiated IHD on 8/23.  Seen by palliative care team. No sign of renal recovery therefore requiring intubated HD, last HD yesterday and tolerated well. Continue daily lab, strict ins and out and daily assessment for dialysis need.  #Septic shock, off of pressors.  Blood pressure improving.  #High-grade small bowel obstruction now status post ex lap and lysis of adhesion.  On TPN.  General surgery team is following.  #Hyponatremia, hypervolemic: Recommend to minimize free water.  #Anemia of critical illness: Received a unit of blood transfusion on 8/29.  Iron saturation is only 9 with anemia.  Treated with IV iron.,  Transfuse as needed..    Subjective: Seen and examined.  No exact measurement of urine output as he is incontinent.  She had dialysis yesterday, total UF documented 90 cc which might be inaccurate per dialysis nurse.  Patient denies nausea, vomiting, chest pain or shortness of breath. Objective Vital signs in last 24 hours: Vitals:   01/25/22 0520 01/25/22 0622 01/25/22 0758 01/25/22 1147  BP:  118/64 103/64 121/69  Pulse: 83 72 82 77  Resp: '18 17 19 20  '$ Temp: 98.4 F (36.9 C) 98.2 F (36.8 C) 98.2 F (36.8 C) 97.9 F (36.6 C)  TempSrc: Oral Oral Oral Oral  SpO2: 97% 96% 97%   Weight:  107.2 kg    Height:  '5\' 4"'$  (1.626 m)     Weight change: -0.9 kg  Intake/Output Summary (Last 24 hours) at 01/25/2022 1156 Last data filed at 01/24/2022 2103 Gross per 24 hour  Intake 709.64 ml  Output 90 ml  Net  619.64 ml        Labs: RENAL PANEL Recent Labs    01/13/22 1521 01/14/22 0400 01/14/22 1615 01/15/22 0431 01/16/22 0627 01/17/22 0115 01/17/22 0536 01/17/22 1100 01/18/22 0707 01/18/22 1145 01/19/22 0515 01/20/22 0343 01/21/22 0645 01/22/22 0415 01/22/22 0814 01/23/22 0637 01/24/22 0500 01/24/22 1504 01/25/22 0548  NA 137 138 136 138 134* 132*  --  131* 126* 130* 129* 129* 131*  --  134* 134* 135 136 136  K 3.9 3.7 3.6 3.3* 3.3* 2.6*  --  3.1* 3.1* 3.3* 3.6 3.9 3.7  --  3.8 3.7 3.8 3.9 3.6  CL 108 109 107 107 106 102  --  102 102 100 96* 97* 96*  --  99 99 100 100 99  CO2 20* 20* '24 24 22 22  '$ --  22 21* '25 24 25 26  '$ --  '28 26 27 28 30  '$ GLUCOSE 157* 158* 125* 98 113* 112*  --  120* 112* 145* 121* 110* 106*  --  145* 119* 105* 95 105*  BUN 43* 31* 23 28* 43* 34*  --  41* 54* 39* 47* 60* 77*  --  59* 69* 78* 80* 47*  CREATININE 2.56* 2.08* 1.64* 2.09* 2.75* 2.08*  --  2.33* 2.57* 1.92* 2.19* 2.43* 2.73*  --  2.14* 2.48* 2.74* 2.71* 1.94*  CALCIUM 7.5* 7.4* 7.3* 7.4* 7.7* 7.1*  --  7.3* 7.1* 7.1* 7.4* 7.5* 7.7*  --  7.8* 7.9* 8.2*  8.3* 8.0*  MG  --  2.4  --  2.1 2.1 1.5*  --  1.9 1.8  --  1.8 2.0 2.1 1.9  --  2.1 2.3  --  2.0  PHOS 2.3* 1.9* 1.6* 2.0* 1.4* 2.6 2.5  --  3.2  --  3.0 3.8 4.4  --  3.7 3.6 4.3 4.4  --   ALBUMIN 1.9* 1.9* 1.8* 1.8* 1.8*  --   --   --   --   --   --   --  1.7*  --  1.7* 1.7* 1.8* 1.9*  --       Liver Function Tests: Recent Labs  Lab 01/21/22 0645 01/22/22 0814 01/23/22 0637 01/24/22 0500 01/24/22 1504  AST 16  --   --  15  --   ALT 16  --   --  15  --   ALKPHOS 93  --   --  88  --   BILITOT 0.3  --   --  0.6  --   PROT 4.7*  --   --  5.1*  --   ALBUMIN 1.7*   < > 1.7* 1.8* 1.9*   < > = values in this interval not displayed.    No results for input(s): "LIPASE", "AMYLASE" in the last 168 hours. No results for input(s): "AMMONIA" in the last 168 hours. CBC: Recent Labs    01/22/22 0429 01/23/22 0637 01/24/22 0500 01/24/22 1504  01/25/22 0548  HGB 7.1* 7.4* 7.6* 7.6* 7.2*  MCV  --  88.4 89.1 90.5 89.6  VITAMINB12  --   --  981*  --   --   FOLATE  --   --  11.8  --   --   FERRITIN  --   --  111  --   --   TIBC  --   --  262  --   --   IRON  --   --  24*  --   --   RETICCTPCT  --   --  3.4*  --   --      Cardiac Enzymes: No results for input(s): "CKTOTAL", "CKMB", "CKMBINDEX", "TROPONINI" in the last 168 hours. CBG: Recent Labs  Lab 01/24/22 2133 01/25/22 0028 01/25/22 0618 01/25/22 0752 01/25/22 1144  GLUCAP 98 119* 121* 119* 146*     Iron Studies:  Recent Labs    01/24/22 0500  IRON 24*  TIBC 262  FERRITIN 111    Studies/Results: No results found.  Medications: Infusions:  sodium chloride Stopped (01/16/22 1822)   sodium chloride Stopped (01/15/22 1255)   ferric gluconate (FERRLECIT) IVPB Stopped (01/24/22 1708)   heparin 1,700 Units/hr (11/19/92 8546)   TPN CYCLIC-ADULT (ION) 46 mL/hr at 27/03/50 0938   TPN CYCLIC-ADULT (ION)      Scheduled Medications:  amiodarone  400 mg Oral Daily   Followed by   Derrill Memo ON 01/27/2022] amiodarone  200 mg Oral Daily   Chlorhexidine Gluconate Cloth  6 each Topical Q0600   Chlorhexidine Gluconate Cloth  6 each Topical Q0600   escitalopram  10 mg Oral Daily   feeding supplement (NEPRO CARB STEADY)  237 mL Oral BID BM   ferrous sulfate  325 mg Oral Q breakfast   folic acid  1 mg Oral Daily   guaiFENesin  600 mg Oral BID   metoprolol tartrate  12.5 mg Oral BID   multivitamin with minerals  1 tablet Oral Daily   sodium chloride flush  10-40 mL  Intracatheter Q12H   sodium chloride flush  3 mL Intravenous Q12H    have reviewed scheduled and prn medications.  Physical Exam: General: Able to lie flat, not in distress Heart:RRR, s1s2 nl Lungs: Basal degree breath sound, no wheezing Abdomen:soft, Non-tender, non-distended Extremities: Upper extremities edema noted, trace leg edema + Dialysis Access: Left IJ temporary HD catheter in place  Anhthu Perdew  Prasad Tamarion Haymond 01/25/2022,11:56 AM  LOS: 18 days

## 2022-01-25 NOTE — Progress Notes (Signed)
Nutrition Follow-up  DOCUMENTATION CODES:   Non-severe (moderate) malnutrition in context of acute illness/injury  INTERVENTION:   TPN management per pharmacy Continue Nepro Shake po BID, each supplement provides 425 kcal and 19 grams protein Encourage good PO intake Continue Multivitamin w/ minerals daily  NUTRITION DIAGNOSIS:   Moderate Malnutrition related to acute illness (SBO) as evidenced by energy intake < 75% for > 7 days, mild muscle depletion, moderate fat depletion. - Ongoing   GOAL:   Patient will meet greater than or equal to 90% of their needs - Ongoing  MONITOR:   PO intake, Supplement acceptance, I & O's, Labs, Weight trends  REASON FOR ASSESSMENT:   Consult New TPN/TNA  ASSESSMENT:   76 yo female with septic shock, high-grade small bowel obstruction. 8/17- status post exploratory laparotomy with lysis of adhesions. Acute kidney injury. Per Nephrology-pt anuric and is scheduled for transfer to [facility] for CRRT. 8/17-Right IJ.  8/17: s/p ex-lap, lysis of adhesion 8/21: CRRT d/c; TPN started 8/23: first iHD  8/25: diet advanced to clear liquid; s/p thoracentesis-yield 1.1L 8/28: diet advanced to dysphagia 1 8/30 - diet advanced to Dysphagia 3  Pt resting in bed, son at bedside. Pt reports that she has been eating cream of wheat and drinking about 1/3 of  the Nepro shakes. States that she has some nausea after eating and that she has swelling after eating. Son reports that she eats the jello and [place] ices well, other than that will only take a few bites of items. Discussed that we prefer pt to feed into the gut versus use TPN if able. Reviewed the menu with pt and son; encouraged son to assist in ordering pt meals to encourage good PO intake.   Medications reviewed and include: Ferrous Sulfate, Folic acid, MVI Labs reviewed: 24 hr CBG 98-146  HD on 8/31 Net UF: 90 mL  Diet Order:   Diet Order             DIET DYS 3 Room service appropriate? Yes; Fluid  consistency: Thin  Diet effective now                   EDUCATION NEEDS:   Not appropriate for education at this time  Skin:  Skin Assessment: Skin Integrity Issues: Skin Integrity Issues:: Other (Comment) Stage II: coccyx, L side of face Other: PI lumbar, L buttock  Last BM:  9/1  Height:   Ht Readings from Last 1 Encounters:  01/25/22 '5\' 4"'$  (1.626 m)    Weight:   Wt Readings from Last 1 Encounters:  01/25/22 107.2 kg    Ideal Body Weight:  54.5 kg  BMI:  Body mass index is 40.57 kg/m.  Estimated Nutritional Needs:   Kcal:  1700-1900  Protein:  100-120 gm  Fluid:  1L + UOP   Hermina Barters RD, LDN Clinical Dietitian See [facility] for contact information.

## 2022-01-25 NOTE — Progress Notes (Signed)
PT Lymphedema Management Note  Patient progressing and noted improved edema with measurements today.  She tolerated wrappings well.  Notes her daughter in law ordered compression sleeves, but only one came.  Educated will wrap once more to address two areas still slightly larger than R and then try the compression sleeves.  Patient and son verbalized understanding.  Plan to check wraps Tuesday 9/5 and remove and place compression sleeves if stabilized.    Measurements as follows:    L arm (in cm)  R arm (in cm) MCP's  19   21.5 Wrist  '18   20 10 '$ cm prox 26.'4   28 10 '$ cm prox 36.3   34.2 Elbow crease 31   32.9 10 cm prox 34   30.8    01/25/22 1700  PT Visit Information  Last PT Received On 01/25/22  Assistance Needed +2  History of Present Illness 76 yo female admitted 8/14 to [facility] with vomiting and diarrhea with partial SBO. 8/17 intubated and ex lap. 8/19 pt with worsening renal function, transfer to [place] and CRRT started. 8/21 extubation with CRRT off. PMHx: COPD, HLD, HTN, PE, hypothyroidism, OA and depression  Precautions  Precautions Fall  Precaution Comments enteric precautions  Pain Assessment  Pain Assessment Faces  Faces Pain Scale 6  Pain Location lower back  Pain Descriptors / Indicators Grimacing;Aching  Pain Intervention(s) Monitored during session;Repositioned (placed rolled bed pad under lumbar area at pt request)  Cognition  Arousal/Alertness Awake/alert  Behavior During Therapy [address] for tasks assessed/performed;Anxious  Overall Cognitive Status Impaired/Different from baseline  Area of Impairment Memory;Attention;Following commands  Current Attention Level Sustained  Memory Decreased short-term memory  Following Commands Follows one step commands consistently;Follows one step commands with increased time  Safety/Judgement Decreased awareness of deficits  Problem Solving Slow processing  Bed Mobility  Overal bed mobility Needs Assistance  Bed Mobility Rolling   Rolling Mod assist  General bed mobility comments rolled to place roll under lumbar area for comfort, but focus of session on lymphedema wraps/measurements  General Exercises - Upper Extremity  Shoulder Flexion AROM;Left;5 reps;Supine  Elbow Extension AROM;Both;5 reps;Supine (and extension)  Wrist Flexion AROM;Both;5 reps;Supine  Wrist Extension AROM;Both;5 reps;Supine  Digit Composite Flexion AROM;Both;5 reps;Supine  Composite Extension AROM;Both;5 reps;Supine  PT - End of Session  Equipment Utilized During Treatment Oxygen  Activity Tolerance Patient tolerated treatment well  Patient left in bed;with call bell/phone within reach;with family/visitor present  Nurse Communication Other (comment) (monitor for issues with wrapping)   PT - Assessment/Plan  PT Plan Current plan remains appropriate  PT Visit Diagnosis Other abnormalities of gait and mobility (R26.89);Difficulty in walking, not elsewhere classified (R26.2);Muscle weakness (generalized) (M62.81)  PT Frequency (ACUTE ONLY) Min 3X/week  Follow Up Recommendations Skilled nursing-short term rehab (<3 hours/day)  Can patient physically be transported by private vehicle No  Assistance recommended at discharge Frequent or constant Supervision/Assistance  Patient can return home with the following Two people to help with walking and/or transfers;Direct supervision/assist for medications management;Two people to help with bathing/dressing/bathroom;Assist for transportation;Direct supervision/assist for financial management  PT equipment Other (comment);Hospital bed  AM-PAC PT "6 Clicks" Mobility Outcome Measure (Version 2)  Help needed turning from your back to your side while in a flat bed without using bedrails? 2  Help needed moving from lying on your back to sitting on the side of a flat bed without using bedrails? 1  Help needed moving to and from a bed to a chair (including a wheelchair)? 1  Help needed standing up from a chair  using your arms (e.g., wheelchair or bedside chair)? 1  Help needed to walk in hospital room? 1  Help needed climbing 3-5 steps with a railing?  1  6 Click Score 7  Consider Recommendation of Discharge To: CIR/SNF/LTACH  Progressive Mobility  What is the highest level of mobility based on the progressive mobility assessment? Level 2 (Chairfast) - Balance while sitting on edge of bed and cannot stand  Activity Turned to right side  PT Goal Progression  Progress towards PT goals Progressing toward goals  PT Time Calculation  PT Start Time (ACUTE ONLY) 1520  PT Stop Time (ACUTE ONLY) 1614  PT Time Calculation (min) (ACUTE ONLY) 54 min  PT General Charges  $$ ACUTE PT VISIT 1 Visit  PT Treatments  $Therapeutic Activity 38-52 mins   Magda Kiel, PT Acute Rehabilitation Services Office:[phone] 01/25/2022

## 2022-01-25 NOTE — TOC Progression Note (Signed)
Transition of Care [facility]) - Progression Note    Patient Details  Name: [name] MRN: [id number] Date of Birth: [birth year]  Transition of Care [facility]) CM/SW [place], [place] Phone Number: 01/25/2022, 2:54 PM  Clinical Narrative:     CSW following for SNF. Patient currently still on TPN. CSW will continue to follow and assist with patients dc planning needs.  Expected Discharge Plan: Home/Self Care Barriers to Discharge: Continued Medical Work up  Expected Discharge Plan and Services Expected Discharge Plan: Home/Self Care                                               Social Determinants of Health (SDOH) Interventions    Readmission Risk Interventions     No data to display

## 2022-01-25 NOTE — Telephone Encounter (Signed)
FMLA paperwork completed and faxed to [facility] at [phone] and received confirmation.

## 2022-01-25 NOTE — Progress Notes (Signed)
PHARMACY - TOTAL PARENTERAL NUTRITION CONSULT NOTE   Indication: Prolonged ileus  Patient Measurements: Height: '5\' 4"'$  (162.6 cm) Weight: 107.2 kg (236 lb 5.3 oz) IBW/kg (Calculated) : 54.7 TPN AdjBW (KG): 67.8 Body mass index is 40.57 kg/m.  Assessment: 76 yo female with septic shock, high-grade small bowel obstruction. Underwent surgery on 8/17 with ex lap and lysis of adhesions for SBO. Reported vomiting and diarrhea since 8/11 up until surgery on 8/17. Pharmacy consulted for TPN due to prolonged ileus.  9/1[name] with provider and nurse surrounding oral intake. Patient may need assistance with oral intake, as she was unaware her breakfast was bedside yesterday morning. She prefers wheaties and RN plans to mix some of the Nepro Carb Steady to this throughout the day to increase intake. Overall, reports not feeling hungry and loses appetite after a few bites.  Glucose / Insulin: no hx DM - CBGs < 140, off SSI Electrolytes: CoCa 9.7 - all others WNL Renal: 8/19-8/21 CRRT >> 8/23 HD (last on 8/31), BUN 47 Hepatic: AST / ALT / tbili wnl, TG 35, albumin 1.8 Intake / Output; MIVF: Anuric, LBM 8/31 GI Imaging:  8/16 KUB: high grade bowel obstruction  8/24 CT abd: decr gas distention, anasarca throughout abd wall, distention of lower abdominal/pelvic small bowel loops  8/24 KUB: persistent SBO vs. post-op ileus, suspected ascites   GI Surgeries / Procedures:  8/17 with ex lap and LOA for SBO  Central access: triple lumen CVC placed 01/10/22 TPN start date: 01/14/22  Nutritional Goals:  RD Estimated Needs Total Energy Estimated Needs: 1700-1900 Total Protein Estimated Needs: 100-120 gm Total Fluid Estimated Needs: 1L + UOP  Current Nutrition:  TPN 8/27 CLD 8/28 DYS 1 8/30 DYS 3   Nepro Carb Steady BID - 1 charted (<50% consumed)  Plan:  Continue cycle concentrated TPN at half goal (rate 23-46 ml/hr, GIR 0.58-1.16 mg/kg/min) which provides 59g AA and 960 Kcal, meeting 50% estimated  needs with goal to wean Electrolytes in TPN: Na 150 mEq/L (Max), K 16 mEq/L, Ca 5 mEq/L, Mg 7 mEq/L, Phos 2 mmol/L, TM:HDQQ 1:1 (will be half due to half rate) Change MVI and trace elements to enteral given full dysphagia diet Monitor TPN labs on Mon/Thurs BMP tomorrow F/u PO intake/diet advancement  Erskine Speed, PharmD Clinical Pharmacist

## 2022-01-25 NOTE — Progress Notes (Signed)
PROGRESS NOTE  [name] ZOX:[id number] DOB: [birth year]   PCP: Chevis Pretty, FNP  Patient is from: Home  DOA: 01/07/2022 LOS: 10  Chief complaints Chief Complaint  Patient presents with   Abdominal Pain     Brief Narrative / Interim history: 76 year old F with PMH of PE, HTN, HLD, hypothyroidism, osteoarthritis and depression returned to [facility] ED with nausea, vomiting and diarrhea and found to small bowel obstruction.  She then developed AKI and septic shock and underwent emergent ex lap with lysis of adhesion on 8/17.  Postoperatively, she remained intubated, started on CRRT and transferred to [facility] on 8/19.  Eventually extubated on 8/21, and started on TNA.  She is started intermittent HD on 8/23.   Hospital course complicated by A-fib with RVR and diarrhea.  Cardiology consulted and she was started on IV amiodarone for A-fib with RVR.  In regards to diarrhea, C. difficile negative.  GIP positive for STEC.  Diarrhea improving.  Patient had repeat CT abdomen as part of evaluation for worsening abdominal pain that was negative for abscess but moderate bilateral pleural effusion.  She underwent right thoracocentesis with removal of 1.1 L on 8/25.  General surgery and nephrology following.  SLP recommended dysphagia 3 diet after FEES on 8/30.    Disposition likely SNF once cleared by nephrology and general surgery.  Dialysis as needed.  Subjective: Seen and examined earlier this morning.  No major events overnight of this morning.  She reports BLE swelling.  Denies pain or shortness of breath.  Objective: Vitals:   01/25/22 0520 01/25/22 0622 01/25/22 0758 01/25/22 1147  BP:  118/64 103/64 121/69  Pulse: 83 72 82 77  Resp: '18 17 19 20  '$ Temp: 98.4 F (36.9 C) 98.2 F (36.8 C) 98.2 F (36.8 C) 97.9 F (36.6 C)  TempSrc: Oral Oral Oral Oral  SpO2: 97% 96% 97%   Weight:  107.2 kg    Height:  '5\' 4"'$  (1.626 m)      Examination:  GENERAL: No apparent  distress.  Nontoxic. HEENT: MMM.  Vision and hearing grossly intact.  NECK: Supple.  No apparent JVD.  RESP:  No IWOB.  Fair aeration bilaterally. CVS:  RRR. Heart sounds normal.  ABD/GI/GU: BS+. Abd soft, NTND.  Laparotomy wound healing. MSK/EXT:  Moves extremities. No apparent deformity.  BLE edema.  Ace wrap over LUE. SKIN: no apparent skin lesion or wound NEURO: Awake and alert. Oriented to 4.  No apparent focal neuro deficit. PSYCH: Calm. Normal affect.  Procedures:  8/17-laparotomy with lysis of adhesion   Microbiology summarized: 8/14-urine culture with multiple species 8/17-MRSA PCR screen negative 8/17-blood cultures NGTD 8/19-MRSA PCR screen negative 8/26-C. difficile PCR negative 8/25-GIP with STEC   Assessment and plan: Principal Problem:   Septic shock ([place]) Active Problems:   Respiratory failure requiring intubation (HCC)   Small bowel obstruction (HCC)   COPD (chronic obstructive pulmonary disease) ([place])   Hyperlipidemia   Hypothyroidism (acquired)   Essential hypertension   History of pulmonary embolus (PE)   AKI (acute kidney injury) ([place])   Acute respiratory failure with hypoxia and hypercapnia (HCC)   Pressure injury of skin   Malnutrition of moderate degree  Septic shock in the setting of mall bowel obstruction and intra-abdominal infection/STEC: Resolved. -Completed antibiotic course with aztreonam and metronidazole on 8/27 -Repeat CT abdomen on 8/24 negative for abscess and decreased bowel distention  SBO status post laparotomy 8/17 -Dysphagia 3 diet on 8/30>> -Now on 1/2 rate TPN and  nepro shakes   Acute Metabolic Encephalopathy: In the setting of septic shock and SBO: Resolved.  Oriented x4. -Reorientation and delirium precaution   Paroxysmal A-fib with RVR/PVCs: Currently rate controlled. -Continue metoprolol and amiodarone -On IV heparin for anticoagulation.  We will transition to p.o. agent prior to discharge -Cardiology signed off.    Acute hypoxic respiratory failure/pulmonary edema/bilateral pleural effusion: Improving. -S/p right thoracocentesis on 8/25 by PCCM -Fluid management by HD per nephrology -Wean off oxygen.  Incentive spirometry and flutter valve.  AKI/azotemia: Likely from ATN from sepsis.  Improving after HD. Recent Labs    01/18/22 0707 01/18/22 1145 01/19/22 0515 01/20/22 0343 01/21/22 0645 01/22/22 0814 01/23/22 0637 01/24/22 0500 01/24/22 1504 01/25/22 0548  BUN 54* 39* 47* 60* 77* 59* 69* 78* 80* 47*  CREATININE 2.57* 1.92* 2.19* 2.43* 2.73* 2.14* 2.48* 2.74* 2.71* 1.94*  -CRRT discontinued on 8/21.  IHD started on 8/23. -Nephrology following-recommends IHD as needed -Monitor intake and output -Avoid nephrotoxic meds     Infectious diarrhea/gastroenteritis due to STEC: Diarrhea resolved.   Iron deficiency anemia superimposed on anemia of chronic disease: H&H stable after transfusion. Recent Labs    01/17/22 0536 01/18/22 0707 01/19/22 0730 01/20/22 0343 01/21/22 0645 01/22/22 0429 01/23/22 0637 01/24/22 0500 01/24/22 1504 01/25/22 0548  HGB 8.3* 7.9* 7.4* 7.2* 7.3* 7.1* 7.4* 7.6* 7.6* 7.2*  -Transfused 1 unit on 8/21 with minimal response.  No obvious bleeding noted. -Continue monitoring   Hyponatremia, hypokalemia, hypophosphatemia: Resolved. -Monitor and correct as appropriate   Leukocytosis: Likely demargination.  Improving. -Continue monitoring  Morbid obesity Body mass index is 40.57 kg/m.  Moderate malnutrition Nutrition Problem: Moderate Malnutrition Etiology: acute illness (SBO) Signs/Symptoms: energy intake < 75% for > 7 days, mild muscle depletion, moderate fat depletion Interventions: Nepro shake  Pressure skin injury: POA Pressure Injury 01/10/22 Coccyx Medial;Mid Stage 2 -  Partial thickness loss of dermis presenting as a shallow open injury with a red, pink wound bed without slough. pink (Active)  01/10/22 1200  Location: Coccyx  Location  Orientation: Medial;Mid  Staging: Stage 2 -  Partial thickness loss of dermis presenting as a shallow open injury with a red, pink wound bed without slough.  Wound Description (Comments): pink  Present on Admission:   Dressing Type Foam - Lift dressing to assess site every shift 01/25/22 0845     Pressure Injury 01/12/22 Face Left Stage 2 -  Partial thickness loss of dermis presenting as a shallow open injury with a red, pink wound bed without slough. red ulceration beneath ETT holder on L cheek (Active)  01/12/22 1815  Location: Face  Location Orientation: Left  Staging: Stage 2 -  Partial thickness loss of dermis presenting as a shallow open injury with a red, pink wound bed without slough.  Wound Description (Comments): red ulceration beneath ETT holder on L cheek  Present on Admission: Yes  Dressing Type None 01/22/22 0740     Pressure Injury 01/16/22 Lumbar Left (Active)  01/16/22 1230  Location: Lumbar  Location Orientation: Left  Staging:   Wound Description (Comments):   Present on Admission: Yes  Dressing Type None 01/19/22 2000     Pressure Injury 01/16/22 Buttocks Left (Active)  01/16/22 1230  Location: Buttocks  Location Orientation: Left  Staging:   Wound Description (Comments):   Present on Admission: Yes  Dressing Type None 01/19/22 2000   DVT prophylaxis:  Place and maintain sequential compression device Start: 01/20/22 0901  Code Status: DNR/DNI Family Communication: None at  bedside Level of care: Telemetry Medical Status is: Inpatient Remains inpatient appropriate because: Small bowel obstruction, acute respiratory failure with hypoxia, fluid overload and acute kidney failure   Final disposition: Likely SNF once medically cleared. Consultants:  Pulmonology Nephrology Cardiology  Sch Meds:  Scheduled Meds:  amiodarone  400 mg Oral Daily   Followed by   Derrill Memo ON 01/27/2022] amiodarone  200 mg Oral Daily   Chlorhexidine Gluconate Cloth  6 each  Topical Q0600   Chlorhexidine Gluconate Cloth  6 each Topical Q0600   escitalopram  10 mg Oral Daily   feeding supplement (NEPRO CARB STEADY)  237 mL Oral BID BM   ferrous sulfate  325 mg Oral Q breakfast   folic acid  1 mg Oral Daily   guaiFENesin  600 mg Oral BID   metoprolol tartrate  12.5 mg Oral BID   multivitamin with minerals  1 tablet Oral Daily   sodium chloride flush  10-40 mL Intracatheter Q12H   sodium chloride flush  3 mL Intravenous Q12H   Continuous Infusions:  sodium chloride Stopped (01/16/22 1822)   sodium chloride Stopped (01/15/22 1255)   ferric gluconate (FERRLECIT) IVPB Stopped (01/24/22 1708)   heparin 1,700 Units/hr (49/70/26 3785)   TPN CYCLIC-ADULT (ION)     PRN Meds:.sodium chloride, acetaminophen, albuterol, alteplase, guaiFENesin-dextromethorphan, lip balm, [DISCONTINUED] ondansetron **OR** ondansetron (ZOFRAN) IV, mouth rinse, oxyCODONE, sodium chloride flush, sodium chloride flush  Antimicrobials: Anti-infectives (From admission, onward)    Start     Dose/Rate Route Frequency Ordered Stop   01/16/22 2000  aztreonam (AZACTAM) 1 g in sodium chloride 0.9 % 100 mL IVPB  Status:  Discontinued        1 g 200 mL/hr over 30 Minutes Intravenous Every 8 hours 01/16/22 1840 01/20/22 1254   01/16/22 2000  metroNIDAZOLE (FLAGYL) IVPB 500 mg  Status:  Discontinued        500 mg 100 mL/hr over 60 Minutes Intravenous Every 8 hours 01/16/22 1840 01/20/22 1254   01/15/22 0526  aztreonam (AZACTAM) 1 g in sodium chloride 0.9 % 100 mL IVPB  Status:  Discontinued       See Hyperspace for full Linked Orders Report.   1 g 200 mL/hr over 30 Minutes Intravenous Every 24 hours 01/14/22 1529 01/14/22 1534   01/15/22 0526  aztreonam (AZACTAM) 1 g in sodium chloride 0.9 % 100 mL IVPB       See Hyperspace for full Linked Orders Report.   1 g 200 mL/hr over 30 Minutes Intravenous Every 24 hours 01/14/22 1534 01/15/22 0504   01/13/22 2100  vancomycin (VANCOCIN) IVPB 1000 mg/200  mL premix  Status:  Discontinued        1,000 mg 200 mL/hr over 60 Minutes Intravenous Every 24 hours 01/12/22 2135 01/13/22 1027   01/13/22 0500  aztreonam (AZACTAM) 2 g in sodium chloride 0.9 % 100 mL IVPB  Status:  Discontinued       See Hyperspace for full Linked Orders Report.   2 g 200 mL/hr over 30 Minutes Intravenous Every 12 hours 01/12/22 2135 01/14/22 1529   01/12/22 2015  vancomycin (VANCOREADY) IVPB 1750 mg/350 mL        1,750 mg 175 mL/hr over 120 Minutes Intravenous  Once 01/12/22 1921 01/12/22 2259   01/11/22 1800  aztreonam (AZACTAM) 1 g in sodium chloride 0.9 % 100 mL IVPB  Status:  Discontinued       See Hyperspace for full Linked Orders Report.   1 g 200  mL/hr over 30 Minutes Intravenous Every 8 hours 01/11/22 0815 01/12/22 2135   01/11/22 1000  levofloxacin (LEVAQUIN) IVPB 500 mg  Status:  Discontinued        500 mg 100 mL/hr over 60 Minutes Intravenous Every 48 hours 01/10/22 1432 01/11/22 0744   01/11/22 0915  aztreonam (AZACTAM) 2 g in sodium chloride 0.9 % 100 mL IVPB       See Hyperspace for full Linked Orders Report.   2 g 200 mL/hr over 30 Minutes Intravenous  Once 01/11/22 0815 01/11/22 1045   01/11/22 0845  aztreonam (AZACTAM) 2 g in sodium chloride 0.9 % 100 mL IVPB  Status:  Discontinued        2 g 200 mL/hr over 30 Minutes Intravenous  Once 01/11/22 0747 01/11/22 0815   01/11/22 0845  metroNIDAZOLE (FLAGYL) IVPB 500 mg  Status:  Discontinued        500 mg 100 mL/hr over 60 Minutes Intravenous Every 12 hours 01/11/22 0747 01/15/22 0851   01/10/22 1900  levofloxacin (LEVAQUIN) IVPB 500 mg  Status:  Discontinued        500 mg 100 mL/hr over 60 Minutes Intravenous Every 24 hours 01/10/22 1426 01/10/22 1432   01/10/22 0700  ciprofloxacin (CIPRO) IVPB 400 mg        400 mg 200 mL/hr over 60 Minutes Intravenous On call to O.R. 01/10/22 0612 01/10/22 1052   01/10/22 0700  metroNIDAZOLE (FLAGYL) IVPB 500 mg  Status:  Discontinued        500 mg 100 mL/hr over  60 Minutes Intravenous On call to O.R. 01/10/22 0612 01/10/22 1123   01/08/22 0830  Levofloxacin (LEVAQUIN) IVPB 250 mg  Status:  Discontinued        250 mg 50 mL/hr over 60 Minutes Intravenous Every 24 hours 01/08/22 0724 01/08/22 0727   01/08/22 0830  Levofloxacin (LEVAQUIN) IVPB 250 mg  Status:  Discontinued        250 mg 50 mL/hr over 60 Minutes Intravenous Every 48 hours 01/08/22 0727 01/08/22 0732   01/08/22 0830  Levofloxacin (LEVAQUIN) IVPB 250 mg  Status:  Discontinued        250 mg 50 mL/hr over 60 Minutes Intravenous Every 24 hours 01/08/22 0732 01/10/22 1426        I have personally reviewed the following labs and images: CBC: Recent Labs  Lab 01/21/22 0645 01/22/22 0429 01/23/22 0637 01/24/22 0500 01/24/22 1504 01/25/22 0548  WBC 14.3*  --  12.1* 11.2* 10.9* 9.1  HGB 7.3* 7.1* 7.4* 7.6* 7.6* 7.2*  HCT 22.6* 22.2* 22.8* 23.8* 24.7* 22.5*  MCV 87.6  --  88.4 89.1 90.5 89.6  PLT 417*  --  454* 532* 560* 541*   BMP &GFR Recent Labs  Lab 01/21/22 0645 01/22/22 0415 01/22/22 0814 01/23/22 0637 01/24/22 0500 01/24/22 1504 01/25/22 0548  NA 131*  --  134* 134* 135 136 136  K 3.7  --  3.8 3.7 3.8 3.9 3.6  CL 96*  --  99 99 100 100 99  CO2 26  --  '28 26 27 28 30  '$ GLUCOSE 106*  --  145* 119* 105* 95 105*  BUN 77*  --  59* 69* 78* 80* 47*  CREATININE 2.73*  --  2.14* 2.48* 2.74* 2.71* 1.94*  CALCIUM 7.7*  --  7.8* 7.9* 8.2* 8.3* 8.0*  MG 2.1 1.9  --  2.1 2.3  --  2.0  PHOS 4.4  --  3.7 3.6 4.3 4.4  --  Estimated Creatinine Clearance: 29.9 mL/min (A) (by C-G formula based on SCr of 1.94 mg/dL (H)). Liver & Pancreas: Recent Labs  Lab 01/21/22 0645 01/22/22 0814 01/23/22 0637 01/24/22 0500 01/24/22 1504  AST 16  --   --  15  --   ALT 16  --   --  15  --   ALKPHOS 93  --   --  88  --   BILITOT 0.3  --   --  0.6  --   PROT 4.7*  --   --  5.1*  --   ALBUMIN 1.7* 1.7* 1.7* 1.8* 1.9*   No results for input(s): "LIPASE", "AMYLASE" in the last 168  hours. No results for input(s): "AMMONIA" in the last 168 hours. Diabetic: No results for input(s): "HGBA1C" in the last 72 hours. Recent Labs  Lab 01/24/22 2133 01/25/22 0028 01/25/22 0618 01/25/22 0752 01/25/22 1144  GLUCAP 98 119* 121* 119* 146*   Cardiac Enzymes: No results for input(s): "CKTOTAL", "CKMB", "CKMBINDEX", "TROPONINI" in the last 168 hours. No results for input(s): "PROBNP" in the last 8760 hours. Coagulation Profile: No results for input(s): "INR", "PROTIME" in the last 168 hours. Thyroid Function Tests: No results for input(s): "TSH", "T4TOTAL", "FREET4", "T3FREE", "THYROIDAB" in the last 72 hours. Lipid Profile: Recent Labs    01/24/22 0500  TRIG 35   Anemia Panel: Recent Labs    01/24/22 0500  VITAMINB12 981*  FOLATE 11.8  FERRITIN 111  TIBC 262  IRON 24*  RETICCTPCT 3.4*   Urine analysis:    Component Value Date/Time   COLORURINE YELLOW 01/11/2022 1051   APPEARANCEUR CLOUDY (A) 01/11/2022 1051   APPEARANCEUR Clear 03/01/2021 1257   LABSPEC 1.010 01/11/2022 1051   PHURINE 5.0 01/11/2022 1051   GLUCOSEU NEGATIVE 01/11/2022 1051   HGBUR SMALL (A) 01/11/2022 1051   BILIRUBINUR NEGATIVE 01/11/2022 1051   BILIRUBINUR Negative 03/01/2021 [place] 01/11/2022 1051   PROTEINUR NEGATIVE 01/11/2022 1051   NITRITE NEGATIVE 01/11/2022 1051   LEUKOCYTESUR LARGE (A) 01/11/2022 1051   Sepsis Labs: Invalid input(s): "PROCALCITONIN", "LACTICIDVEN"  Microbiology: Recent Results (from the past 240 hour(s))  Gastrointestinal Panel by PCR , Stool     Status: Abnormal   Collection Time: 01/18/22  3:42 PM   Specimen: Stool  Result Value Ref Range Status   Campylobacter species NOT DETECTED NOT DETECTED Final   Plesimonas shigelloides NOT DETECTED NOT DETECTED Final   Salmonella species NOT DETECTED NOT DETECTED Final   Yersinia enterocolitica NOT DETECTED NOT DETECTED Final   Vibrio species NOT DETECTED NOT DETECTED Final   Vibrio  cholerae NOT DETECTED NOT DETECTED Final   Enteroaggregative E coli (EAEC) NOT DETECTED NOT DETECTED Final   Enterotoxigenic E coli (ETEC) NOT DETECTED NOT DETECTED Final   Shiga like toxin producing E coli (STEC) DETECTED (A) NOT DETECTED Final    Comment: RESULT CALLED TO, READ BACK BY AND VERIFIED WITH: NAA DORMON 01/20/22 1417 AMK    E. coli O157 NOT DETECTED NOT DETECTED Final   Shigella/Enteroinvasive E coli (EIEC) NOT DETECTED NOT DETECTED Final   Cryptosporidium NOT DETECTED NOT DETECTED Final   Cyclospora cayetanensis NOT DETECTED NOT DETECTED Final   Entamoeba histolytica NOT DETECTED NOT DETECTED Final   Giardia lamblia NOT DETECTED NOT DETECTED Final   Adenovirus F40/41 NOT DETECTED NOT DETECTED Final   Astrovirus NOT DETECTED NOT DETECTED Final   Norovirus GI/GII NOT DETECTED NOT DETECTED Final   Rotavirus A NOT DETECTED NOT DETECTED Final  Sapovirus (I, II, IV, and V) NOT DETECTED NOT DETECTED Final    Comment: Performed at [facility], [address] [place], [place] [address]  C Difficile Quick Screen (NO PCR Reflex)     Status: None   Collection Time: 01/19/22  6:32 PM   Specimen: STOOL  Result Value Ref Range Status   C Diff antigen NEGATIVE NEGATIVE Final   C Diff toxin NEGATIVE NEGATIVE Final   C Diff interpretation No C. difficile detected.  Final    Comment: Performed at [facility], [place] [address], [place], [place] [address]    Radiology Studies: No results found.    Ason Heslin T. [place]  If 7PM-7AM, please contact night-coverage [email or web address] 01/25/2022, 1:52 PM

## 2022-01-25 NOTE — Progress Notes (Signed)
ANTICOAGULATION CONSULT NOTE - Follow Up Consult  Pharmacy Consult for heparin Indication: atrial fibrillation  Labs: Recent Labs    01/23/22 0731 01/24/22 0500 01/24/22 1504 01/25/22 0548  HGB  --  7.6* 7.6* 7.2*  HCT  --  23.8* 24.7* 22.5*  PLT  --  532* 560* 541*  HEPARINUNFRC 0.54 0.74*  --  0.79*  CREATININE  --  2.74* 2.71* 1.94*    Assessment: 76yo female supratherapeutic on heparin after several levels at goal; no infusion issues or signs of bleeding per RN.  Goal of Therapy:  Heparin level 0.3-0.7 units/ml   Plan:  Will decrease heparin infusion by 2 units/kgABW/hr to 1700 units/hr and check level in 8 hours.    [name] Neat, PharmD, BCPS  01/25/2022,6:43 AM

## 2022-01-26 DIAGNOSIS — J449 Chronic obstructive pulmonary disease, unspecified: Secondary | ICD-10-CM | POA: Diagnosis not present

## 2022-01-26 DIAGNOSIS — J9690 Respiratory failure, unspecified, unspecified whether with hypoxia or hypercapnia: Secondary | ICD-10-CM | POA: Diagnosis not present

## 2022-01-26 DIAGNOSIS — A419 Sepsis, unspecified organism: Secondary | ICD-10-CM | POA: Diagnosis not present

## 2022-01-26 DIAGNOSIS — K56609 Unspecified intestinal obstruction, unspecified as to partial versus complete obstruction: Secondary | ICD-10-CM | POA: Diagnosis not present

## 2022-01-26 LAB — BASIC METABOLIC PANEL
Anion gap: 7 (ref 5–15)
BUN: 52 mg/dL — ABNORMAL HIGH (ref 8–23)
CO2: 29 mmol/L (ref 22–32)
Calcium: 8.4 mg/dL — ABNORMAL LOW (ref 8.9–10.3)
Chloride: 102 mmol/L (ref 98–111)
Creatinine, Ser: 2.16 mg/dL — ABNORMAL HIGH (ref 0.44–1.00)
GFR, Estimated: 23 mL/min — ABNORMAL LOW (ref >60)
Glucose, Bld: 112 mg/dL — ABNORMAL HIGH (ref 70–99)
Potassium: 3.4 mmol/L — ABNORMAL LOW (ref 3.5–5.1)
Sodium: 138 mmol/L (ref 135–145)

## 2022-01-26 LAB — MAGNESIUM: Magnesium: 2.2 mg/dL (ref 1.7–2.4)

## 2022-01-26 LAB — CBC
HCT: 22.7 % — ABNORMAL LOW (ref 36.0–46.0)
Hemoglobin: 7.2 g/dL — ABNORMAL LOW (ref 12.0–15.0)
MCH: 28.7 pg (ref 26.0–34.0)
MCHC: 31.7 g/dL (ref 30.0–36.0)
MCV: 90.4 fL (ref 80.0–100.0)
Platelets: 526 10*3/uL — ABNORMAL HIGH (ref 150–400)
RBC: 2.51 MIL/uL — ABNORMAL LOW (ref 3.87–5.11)
RDW: 17.4 % — ABNORMAL HIGH (ref 11.5–15.5)
WBC: 8.4 10*3/uL (ref 4.0–10.5)
nRBC: 0 % (ref 0.0–0.2)

## 2022-01-26 LAB — PREPARE RBC (CROSSMATCH)

## 2022-01-26 LAB — GLUCOSE, CAPILLARY
Glucose-Capillary: 105 mg/dL — ABNORMAL HIGH (ref 70–99)
Glucose-Capillary: 116 mg/dL — ABNORMAL HIGH (ref 70–99)
Glucose-Capillary: 117 mg/dL — ABNORMAL HIGH (ref 70–99)

## 2022-01-26 LAB — HEPARIN LEVEL (UNFRACTIONATED): Heparin Unfractionated: 0.57 IU/mL (ref 0.30–0.70)

## 2022-01-26 MED ORDER — ALTEPLASE 2 MG IJ SOLR: 2.0000 mg | Freq: Once | INTRAMUSCULAR | Status: DC | PRN

## 2022-01-26 MED ORDER — ANTICOAGULANT SODIUM CITRATE 4% (200MG/5ML) IV SOLN
5.0000 mL | Status: DC | PRN
Start: 2022-01-26 — End: 2022-01-30

## 2022-01-26 MED ORDER — STERILE WATER FOR INJECTION IV SOLN
INTRAVENOUS | Status: AC
  Filled 2022-01-26: qty 390

## 2022-01-26 MED ORDER — LIDOCAINE-PRILOCAINE 2.5-2.5 % EX CREA: 1.0000 | TOPICAL_CREAM | CUTANEOUS | Status: DC | PRN

## 2022-01-26 MED ORDER — LIDOCAINE HCL (PF) 1 % IJ SOLN
5.0000 mL | INTRAMUSCULAR | Status: DC | PRN
Start: 2022-01-26 — End: 2022-01-30

## 2022-01-26 MED ORDER — ANTICOAGULANT SODIUM CITRATE 4% (200MG/5ML) IV SOLN: 5.0000 mL | Status: DC | PRN

## 2022-01-26 MED ORDER — SODIUM CHLORIDE 0.9% IV SOLUTION: Freq: Once | INTRAVENOUS | Status: DC

## 2022-01-26 MED ORDER — CHLORHEXIDINE GLUCONATE CLOTH 2 % EX PADS: 6.0000 | MEDICATED_PAD | Freq: Every day | CUTANEOUS | Status: DC

## 2022-01-26 MED ORDER — LIDOCAINE HCL (PF) 1 % IJ SOLN: 5.0000 mL | INTRAMUSCULAR | Status: DC | PRN

## 2022-01-26 MED ORDER — PENTAFLUOROPROP-TETRAFLUOROETH EX AERO: 1.0000 | INHALATION_SPRAY | CUTANEOUS | Status: DC | PRN

## 2022-01-26 MED ORDER — LIDOCAINE-PRILOCAINE 2.5-2.5 % EX CREA
1.0000 | TOPICAL_CREAM | CUTANEOUS | Status: DC | PRN
Start: 2022-01-26 — End: 2022-01-30

## 2022-01-26 MED ORDER — HEPARIN SODIUM (PORCINE) 1000 UNIT/ML DIALYSIS
1000.0000 [IU] | INTRAMUSCULAR | Status: DC | PRN
Start: 2022-01-26 — End: 2022-01-30
  Filled 2022-01-26: qty 1

## 2022-01-26 MED ORDER — PENTAFLUOROPROP-TETRAFLUOROETH EX AERO
1.0000 | INHALATION_SPRAY | CUTANEOUS | Status: DC | PRN
Start: 2022-01-26 — End: 2022-01-30

## 2022-01-26 MED ORDER — HEPARIN SODIUM (PORCINE) 1000 UNIT/ML DIALYSIS
1000.0000 [IU] | INTRAMUSCULAR | Status: DC | PRN
  Filled 2022-01-26: qty 1

## 2022-01-26 NOTE — Progress Notes (Signed)
PROGRESS NOTE  [name] ZOX:[id number] DOB: 1945/12/27   PCP: Chevis Pretty, FNP  Patient is from: Home  DOA: 01/07/2022 LOS: 46  Chief complaints Chief Complaint  Patient presents with   Abdominal Pain     Brief Narrative / Interim history: 76 year old F with PMH of PE, HTN, HLD, hypothyroidism, osteoarthritis and depression returned to [facility] ED with nausea, vomiting and diarrhea and found to small bowel obstruction.  She then developed AKI and septic shock and underwent emergent ex lap with lysis of adhesion on 8/17.  Postoperatively, she remained intubated, started on CRRT and transferred to [facility] on 8/19.  Eventually extubated on 8/21, and started on TNA.  She is started intermittent HD on 8/23.   Hospital course complicated by A-fib with RVR and diarrhea.  Cardiology consulted and she was started on IV amiodarone for A-fib with RVR.  In regards to diarrhea, C. difficile negative.  GIP positive for STEC.  Diarrhea improving.  Patient had repeat CT abdomen as part of evaluation for worsening abdominal pain that was negative for abscess but moderate bilateral pleural effusion.  She underwent right thoracocentesis with removal of 1.1 L on 8/25.  General surgery and nephrology following.  SLP recommended dysphagia 3 diet after FEES on 8/30.  TPN at half rate.  Disposition likely SNF once cleared by nephrology and general surgery.  Dialysis as needed.  Subjective: Seen and examined earlier this morning.  No major events overnight or this morning.  Feels better today.  She says she is less bloated.  She denies chest pain or dyspnea.  She denies abdominal pain.  Objective: Vitals:   01/25/22 2131 01/26/22 0000 01/26/22 0554 01/26/22 0847  BP:  125/80 132/84 136/78  Pulse: 78  73 75  Resp:  '16 17 18  '$ Temp:  98.1 F (36.7 C) 98.2 F (36.8 C) 98.2 F (36.8 C)  TempSrc:  Oral Oral Oral  SpO2:  98% 96% 98%  Weight:   110.2 kg   Height:         Examination:  GENERAL: No apparent distress.  Nontoxic. HEENT: MMM.  Vision and hearing grossly intact.  NECK: Supple.  No apparent JVD.  RESP:  No IWOB.  Fair aeration bilaterally. CVS:  RRR. Heart sounds normal.  ABD/GI/GU: BS+. Abd soft, NTND.  Laparotomy wound healing. MSK/EXT:  Moves extremities. No apparent deformity.  Trace BLE edema.  Ace wrap over LUE. SKIN: no apparent skin lesion or wound NEURO: Sleepy but wakes to voice.  Oriented appropriately.  No apparent focal neuro deficit. PSYCH: Calm. Normal affect.  Procedures:  8/17-laparotomy with lysis of adhesion   Microbiology summarized: 8/14-urine culture with multiple species 8/17-MRSA PCR screen negative 8/17-blood cultures NGTD 8/19-MRSA PCR screen negative 8/26-C. difficile PCR negative 8/25-GIP with STEC   Assessment and plan: Principal Problem:   Septic shock ([place]) Active Problems:   Respiratory failure requiring intubation (HCC)   Small bowel obstruction (HCC)   COPD (chronic obstructive pulmonary disease) ([place])   Hyperlipidemia   Hypothyroidism (acquired)   Essential hypertension   History of pulmonary embolus (PE)   AKI (acute kidney injury) ([place])   Acute respiratory failure with hypoxia and hypercapnia (HCC)   Pressure injury of skin   Malnutrition of moderate degree  Septic shock in the setting of mall bowel obstruction and intra-abdominal infection/STEC: Resolved. -Completed antibiotic course with aztreonam and metronidazole on 8/27 -Repeat CT on 8/24 negative for abscess and decreased bowel distention  SBO status post laparotomy 8/17 -Dysphagia  3 diet on 8/30>> -Now on 1/2 rate TPN and nepro shakes   Acute Metabolic Encephalopathy: In the setting of septic shock and SBO: Resolved.  Oriented x4. -Reorientation and delirium precaution   Paroxysmal A-fib with RVR/PVCs: Currently rate controlled. -Continue metoprolol and amiodarone -On IV heparin for anticoagulation.  We will transition  to p.o. agent prior to discharge -Cardiology signed off.   Acute hypoxic respiratory failure/pulmonary edema/bilateral pleural effusion: Improving. -S/p right thoracocentesis on 8/25 by PCCM -Fluid management by HD per nephrology -Wean off oxygen.  Incentive spirometry and flutter valve.  AKI/azotemia: Likely from ATN in the setting of sepsis. Recent Labs    01/18/22 1145 01/19/22 0515 01/20/22 0343 01/21/22 0645 01/22/22 0814 01/23/22 3474 01/24/22 0500 01/24/22 1504 01/25/22 0548 01/26/22 0306  BUN 39* 47* 60* 77* 59* 69* 78* 80* 47* 52*  CREATININE 1.92* 2.19* 2.43* 2.73* 2.14* 2.48* 2.74* 2.71* 1.94* 2.16*  -CRRT discontinued on 8/21.  IHD started on 8/23. -Nephrology following-recommends IHD as needed -Monitor intake and output -Avoid nephrotoxic meds     Infectious diarrhea/gastroenteritis due to STEC: Diarrhea resolved.   Iron deficiency anemia superimposed on anemia of chronic disease: H&H stable after 1 unit on 8/21. Recent Labs    01/18/22 0707 01/19/22 0730 01/20/22 0343 01/21/22 0645 01/22/22 0429 01/23/22 0637 01/24/22 0500 01/24/22 1504 01/25/22 0548 01/26/22 0306  HGB 7.9* 7.4* 7.2* 7.3* 7.1* 7.4* 7.6* 7.6* 7.2* 7.2*  -Transfuse 1 more unit today. -Continue monitoring   Hyponatremia, hypokalemia, hypophosphatemia: Resolved. -Monitor and correct as appropriate   Leukocytosis: Likely demargination.  Resolved.  Morbid obesity Body mass index is 41.7 kg/m.  Moderate malnutrition Nutrition Problem: Moderate Malnutrition Etiology: acute illness (SBO) Signs/Symptoms: energy intake < 75% for > 7 days, mild muscle depletion, moderate fat depletion Interventions: MVI, TPN, Nepro shake  Pressure skin injury: POA Pressure Injury 01/10/22 Coccyx Medial;Mid Stage 2 -  Partial thickness loss of dermis presenting as a shallow open injury with a red, pink wound bed without slough. pink (Active)  01/10/22 1200  Location: Coccyx  Location Orientation:  Medial;Mid  Staging: Stage 2 -  Partial thickness loss of dermis presenting as a shallow open injury with a red, pink wound bed without slough.  Wound Description (Comments): pink  Present on Admission:   Dressing Type Foam - Lift dressing to assess site every shift 01/26/22 0845     Pressure Injury 01/12/22 Face Left Stage 2 -  Partial thickness loss of dermis presenting as a shallow open injury with a red, pink wound bed without slough. red ulceration beneath ETT holder on L cheek (Active)  01/12/22 1815  Location: Face  Location Orientation: Left  Staging: Stage 2 -  Partial thickness loss of dermis presenting as a shallow open injury with a red, pink wound bed without slough.  Wound Description (Comments): red ulceration beneath ETT holder on L cheek  Present on Admission: Yes  Dressing Type Foam - Lift dressing to assess site every shift 01/26/22 0845     Pressure Injury 01/16/22 Lumbar Left (Active)  01/16/22 1230  Location: Lumbar  Location Orientation: Left  Staging:   Wound Description (Comments):   Present on Admission: Yes  Dressing Type Foam - Lift dressing to assess site every shift 01/26/22 0845     Pressure Injury 01/16/22 Buttocks Left (Active)  01/16/22 1230  Location: Buttocks  Location Orientation: Left  Staging:   Wound Description (Comments):   Present on Admission: Yes  Dressing Type Foam - Lift dressing to  assess site every shift 01/26/22 0400   DVT prophylaxis:  Place and maintain sequential compression device Start: 01/20/22 0901  Code Status: DNR/DNI Family Communication: None at bedside Level of care: Telemetry Medical Status is: Inpatient Remains inpatient appropriate because: Small bowel obstruction, acute respiratory failure with hypoxia, fluid overload and acute kidney failure   Final disposition: Likely SNF once medically cleared. Consultants:  Pulmonology Nephrology Cardiology  Sch Meds:  Scheduled Meds:  sodium chloride    Intravenous Once   [START ON 01/27/2022] amiodarone  200 mg Oral Daily   busPIRone  5 mg Oral BID   Chlorhexidine Gluconate Cloth  6 each Topical Q0600   escitalopram  10 mg Oral Daily   feeding supplement (NEPRO CARB STEADY)  237 mL Oral BID BM   ferrous sulfate  325 mg Oral Q breakfast   folic acid  1 mg Oral Daily   guaiFENesin  600 mg Oral BID   metoprolol tartrate  12.5 mg Oral BID   multivitamin with minerals  1 tablet Oral Daily   sodium chloride flush  10-40 mL Intracatheter Q12H   sodium chloride flush  3 mL Intravenous Q12H   Continuous Infusions:  sodium chloride Stopped (01/16/22 1822)   sodium chloride Stopped (01/15/22 1255)   heparin 1,600 Units/hr (17/79/39 0300)   TPN CYCLIC-ADULT (ION)     PRN Meds:.sodium chloride, acetaminophen, albuterol, alteplase, guaiFENesin-dextromethorphan, lip balm, [DISCONTINUED] ondansetron **OR** ondansetron (ZOFRAN) IV, mouth rinse, oxyCODONE, sodium chloride flush, sodium chloride flush  Antimicrobials: Anti-infectives (From admission, onward)    Start     Dose/Rate Route Frequency Ordered Stop   01/16/22 2000  aztreonam (AZACTAM) 1 g in sodium chloride 0.9 % 100 mL IVPB  Status:  Discontinued        1 g 200 mL/hr over 30 Minutes Intravenous Every 8 hours 01/16/22 1840 01/20/22 1254   01/16/22 2000  metroNIDAZOLE (FLAGYL) IVPB 500 mg  Status:  Discontinued        500 mg 100 mL/hr over 60 Minutes Intravenous Every 8 hours 01/16/22 1840 01/20/22 1254   01/15/22 0526  aztreonam (AZACTAM) 1 g in sodium chloride 0.9 % 100 mL IVPB  Status:  Discontinued       See Hyperspace for full Linked Orders Report.   1 g 200 mL/hr over 30 Minutes Intravenous Every 24 hours 01/14/22 1529 01/14/22 1534   01/15/22 0526  aztreonam (AZACTAM) 1 g in sodium chloride 0.9 % 100 mL IVPB       See Hyperspace for full Linked Orders Report.   1 g 200 mL/hr over 30 Minutes Intravenous Every 24 hours 01/14/22 1534 01/15/22 0504   01/13/22 2100  vancomycin  (VANCOCIN) IVPB 1000 mg/200 mL premix  Status:  Discontinued        1,000 mg 200 mL/hr over 60 Minutes Intravenous Every 24 hours 01/12/22 2135 01/13/22 1027   01/13/22 0500  aztreonam (AZACTAM) 2 g in sodium chloride 0.9 % 100 mL IVPB  Status:  Discontinued       See Hyperspace for full Linked Orders Report.   2 g 200 mL/hr over 30 Minutes Intravenous Every 12 hours 01/12/22 2135 01/14/22 1529   01/12/22 2015  vancomycin (VANCOREADY) IVPB 1750 mg/350 mL        1,750 mg 175 mL/hr over 120 Minutes Intravenous  Once 01/12/22 1921 01/12/22 2259   01/11/22 1800  aztreonam (AZACTAM) 1 g in sodium chloride 0.9 % 100 mL IVPB  Status:  Discontinued  See Hyperspace for full Linked Orders Report.   1 g 200 mL/hr over 30 Minutes Intravenous Every 8 hours 01/11/22 0815 01/12/22 2135   01/11/22 1000  levofloxacin (LEVAQUIN) IVPB 500 mg  Status:  Discontinued        500 mg 100 mL/hr over 60 Minutes Intravenous Every 48 hours 01/10/22 1432 01/11/22 0744   01/11/22 0915  aztreonam (AZACTAM) 2 g in sodium chloride 0.9 % 100 mL IVPB       See Hyperspace for full Linked Orders Report.   2 g 200 mL/hr over 30 Minutes Intravenous  Once 01/11/22 0815 01/11/22 1045   01/11/22 0845  aztreonam (AZACTAM) 2 g in sodium chloride 0.9 % 100 mL IVPB  Status:  Discontinued        2 g 200 mL/hr over 30 Minutes Intravenous  Once 01/11/22 0747 01/11/22 0815   01/11/22 0845  metroNIDAZOLE (FLAGYL) IVPB 500 mg  Status:  Discontinued        500 mg 100 mL/hr over 60 Minutes Intravenous Every 12 hours 01/11/22 0747 01/15/22 0851   01/10/22 1900  levofloxacin (LEVAQUIN) IVPB 500 mg  Status:  Discontinued        500 mg 100 mL/hr over 60 Minutes Intravenous Every 24 hours 01/10/22 1426 01/10/22 1432   01/10/22 0700  ciprofloxacin (CIPRO) IVPB 400 mg        400 mg 200 mL/hr over 60 Minutes Intravenous On call to O.R. 01/10/22 0612 01/10/22 1052   01/10/22 0700  metroNIDAZOLE (FLAGYL) IVPB 500 mg  Status:  Discontinued         500 mg 100 mL/hr over 60 Minutes Intravenous On call to O.R. 01/10/22 0612 01/10/22 1123   01/08/22 0830  Levofloxacin (LEVAQUIN) IVPB 250 mg  Status:  Discontinued        250 mg 50 mL/hr over 60 Minutes Intravenous Every 24 hours 01/08/22 0724 01/08/22 0727   01/08/22 0830  Levofloxacin (LEVAQUIN) IVPB 250 mg  Status:  Discontinued        250 mg 50 mL/hr over 60 Minutes Intravenous Every 48 hours 01/08/22 0727 01/08/22 0732   01/08/22 0830  Levofloxacin (LEVAQUIN) IVPB 250 mg  Status:  Discontinued        250 mg 50 mL/hr over 60 Minutes Intravenous Every 24 hours 01/08/22 0732 01/10/22 1426        I have personally reviewed the following labs and images: CBC: Recent Labs  Lab 01/23/22 0637 01/24/22 0500 01/24/22 1504 01/25/22 0548 01/26/22 0306  WBC 12.1* 11.2* 10.9* 9.1 8.4  HGB 7.4* 7.6* 7.6* 7.2* 7.2*  HCT 22.8* 23.8* 24.7* 22.5* 22.7*  MCV 88.4 89.1 90.5 89.6 90.4  PLT 454* 532* 560* 541* 526*   BMP &GFR Recent Labs  Lab 01/21/22 0645 01/22/22 0415 01/22/22 0814 01/23/22 0637 01/24/22 0500 01/24/22 1504 01/25/22 0548 01/26/22 0306  NA 131*  --  134* 134* 135 136 136 138  K 3.7  --  3.8 3.7 3.8 3.9 3.6 3.4*  CL 96*  --  99 99 100 100 99 102  CO2 26  --  '28 26 27 28 30 29  '$ GLUCOSE 106*  --  145* 119* 105* 95 105* 112*  BUN 77*  --  59* 69* 78* 80* 47* 52*  CREATININE 2.73*  --  2.14* 2.48* 2.74* 2.71* 1.94* 2.16*  CALCIUM 7.7*  --  7.8* 7.9* 8.2* 8.3* 8.0* 8.4*  MG 2.1 1.9  --  2.1 2.3  --  2.0 2.2  PHOS 4.4  --  3.7 3.6 4.3 4.4  --   --    Estimated Creatinine Clearance: 27.3 mL/min (A) (by C-G formula based on SCr of 2.16 mg/dL (H)). Liver & Pancreas: Recent Labs  Lab 01/21/22 0645 01/22/22 0814 01/23/22 0637 01/24/22 0500 01/24/22 1504  AST 16  --   --  15  --   ALT 16  --   --  15  --   ALKPHOS 93  --   --  88  --   BILITOT 0.3  --   --  0.6  --   PROT 4.7*  --   --  5.1*  --   ALBUMIN 1.7* 1.7* 1.7* 1.8* 1.9*   No results for input(s):  "LIPASE", "AMYLASE" in the last 168 hours. No results for input(s): "AMMONIA" in the last 168 hours. Diabetic: No results for input(s): "HGBA1C" in the last 72 hours. Recent Labs  Lab 01/25/22 1144 01/25/22 1812 01/26/22 0028 01/26/22 0558 01/26/22 1318  GLUCAP 146* 100* 116* 105* 117*   Cardiac Enzymes: No results for input(s): "CKTOTAL", "CKMB", "CKMBINDEX", "TROPONINI" in the last 168 hours. No results for input(s): "PROBNP" in the last 8760 hours. Coagulation Profile: No results for input(s): "INR", "PROTIME" in the last 168 hours. Thyroid Function Tests: No results for input(s): "TSH", "T4TOTAL", "FREET4", "T3FREE", "THYROIDAB" in the last 72 hours. Lipid Profile: Recent Labs    01/24/22 0500  TRIG 35   Anemia Panel: Recent Labs    01/24/22 0500  VITAMINB12 981*  FOLATE 11.8  FERRITIN 111  TIBC 262  IRON 24*  RETICCTPCT 3.4*   Urine analysis:    Component Value Date/Time   COLORURINE YELLOW 01/11/2022 1051   APPEARANCEUR CLOUDY (A) 01/11/2022 1051   APPEARANCEUR Clear 03/01/2021 1257   LABSPEC 1.010 01/11/2022 1051   PHURINE 5.0 01/11/2022 1051   GLUCOSEU NEGATIVE 01/11/2022 1051   HGBUR SMALL (A) 01/11/2022 1051   BILIRUBINUR NEGATIVE 01/11/2022 1051   BILIRUBINUR Negative 03/01/2021 [place] 01/11/2022 1051   PROTEINUR NEGATIVE 01/11/2022 1051   NITRITE NEGATIVE 01/11/2022 1051   LEUKOCYTESUR LARGE (A) 01/11/2022 1051   Sepsis Labs: Invalid input(s): "PROCALCITONIN", "LACTICIDVEN"  Microbiology: Recent Results (from the past 240 hour(s))  Gastrointestinal Panel by PCR , Stool     Status: Abnormal   Collection Time: 01/18/22  3:42 PM   Specimen: Stool  Result Value Ref Range Status   Campylobacter species NOT DETECTED NOT DETECTED Final   Plesimonas shigelloides NOT DETECTED NOT DETECTED Final   Salmonella species NOT DETECTED NOT DETECTED Final   Yersinia enterocolitica NOT DETECTED NOT DETECTED Final   Vibrio species NOT  DETECTED NOT DETECTED Final   Vibrio cholerae NOT DETECTED NOT DETECTED Final   Enteroaggregative E coli (EAEC) NOT DETECTED NOT DETECTED Final   Enterotoxigenic E coli (ETEC) NOT DETECTED NOT DETECTED Final   Shiga like toxin producing E coli (STEC) DETECTED (A) NOT DETECTED Final    Comment: RESULT CALLED TO, READ BACK BY AND VERIFIED WITH: NAA DORMON 01/20/22 1417 AMK    E. coli O157 NOT DETECTED NOT DETECTED Final   Shigella/Enteroinvasive E coli (EIEC) NOT DETECTED NOT DETECTED Final   Cryptosporidium NOT DETECTED NOT DETECTED Final   Cyclospora cayetanensis NOT DETECTED NOT DETECTED Final   Entamoeba histolytica NOT DETECTED NOT DETECTED Final   Giardia lamblia NOT DETECTED NOT DETECTED Final   Adenovirus F40/41 NOT DETECTED NOT DETECTED Final   Astrovirus NOT DETECTED NOT DETECTED Final  Norovirus GI/GII NOT DETECTED NOT DETECTED Final   Rotavirus A NOT DETECTED NOT DETECTED Final   Sapovirus (I, II, IV, and V) NOT DETECTED NOT DETECTED Final    Comment: Performed at [facility], [address], [place], [place] [address]  C Difficile Quick Screen (NO PCR Reflex)     Status: None   Collection Time: 01/19/22  6:32 PM   Specimen: STOOL  Result Value Ref Range Status   C Diff antigen NEGATIVE NEGATIVE Final   C Diff toxin NEGATIVE NEGATIVE Final   C Diff interpretation No C. difficile detected.  Final    Comment: Performed at [facility], [address], [place], [place] [address]    Radiology Studies: No results found.    [name] T. Coats  If 7PM-7AM, please contact night-coverage [email or web address] 01/26/2022, 1:50 PM

## 2022-01-26 NOTE — Progress Notes (Signed)
ANTICOAGULATION CONSULT NOTE - Follow Up Consult  Pharmacy Consult for heparin Indication: atrial fibrillation  Labs: Recent Labs    01/24/22 0500 01/24/22 1504 01/25/22 0548 01/25/22 1657 01/26/22 0306  HGB 7.6* 7.6* 7.2*  --  7.2*  HCT 23.8* 24.7* 22.5*  --  22.7*  PLT 532* 560* 541*  --  526*  HEPARINUNFRC 0.74*  --  0.79* 0.69 0.57  CREATININE 2.74* 2.71* 1.94*  --   --     Assessment/Plan:  76yo female therapeutic on heparin after rate change. Will continue infusion at current rate of 1600 units/hr and monitor daily level.   [name] Neat, PharmD, BCPS  01/26/2022,3:53 AM

## 2022-01-26 NOTE — Progress Notes (Signed)
[name]PHROLOGY PROGRESS NOTE  Assessment/ Plan: Pt is a 76 y.o. yo female with history of HTN, HLD, hypothyroidism, PE, presented with SBO underwent emergent ex lap with lysis of adhesion on 8/17, developed anterior dependent respiratory failure, AKI requiring CRRT.  #Acute kidney injury, oligoanuric, dialysis dependent: Likely ischemic ATN from shock/sepsis.  She was on ARB prior to admission.  Initially required CRRT which was stopped on 8/21 and initiated IHD on 8/23.  Seen by palliative care team. Remains oliguric and mildly volume up on exam therefore we will plan to do dialysis today. Continue daily lab, strict ins and out and daily assessment for dialysis need.  #Septic shock, off of pressors.  Blood pressure improving.  #High-grade small bowel obstruction now status post ex lap and lysis of adhesion.  On TPN.  General surgery team is following.  #Hyponatremia, hypervolemic: Recommend to minimize free water.  Currently on tube feed.  #Anemia of critical illness: Received a unit of blood transfusion on 8/29.  Iron saturation is only 9 with anemia.  Treated with IV iron.,  Transfuse as needed..    Subjective: Seen and examined.  Requiring around 2 L of oxygen.  Mild cough but no overt shortness of breath.  Denies chest pain.  Urine output is recorded only 300 cc, she is somewhat incontinent as well.  Discussed with the bedside nurse.  Objective Vital signs in last 24 hours: Vitals:   01/25/22 2131 01/26/22 0000 01/26/22 0554 01/26/22 0847  BP: 134/66 125/80 132/84   Pulse: 78  73 75  Resp: '20 16 17   '$ Temp:  98.1 F (36.7 C) 98.2 F (36.8 C) 98.2 F (36.8 C)  TempSrc:  Oral Oral Oral  SpO2: 98% 98% 96%   Weight:   110.2 kg   Height:       Weight change: 1.1 kg  Intake/Output Summary (Last 24 hours) at 01/26/2022 1043 Last data filed at 01/26/2022 0600 Gross per 24 hour  Intake 1078.81 ml  Output 300 ml  Net 778.81 ml        Labs: RENAL  PANEL Recent Labs    01/13/22 1521 01/14/22 0400 01/14/22 1615 01/15/22 0431 01/16/22 0627 01/17/22 0115 01/17/22 0536 01/17/22 1100 01/18/22 0707 01/18/22 1145 01/19/22 0515 01/20/22 0343 01/21/22 0645 01/22/22 0415 01/22/22 0814 01/23/22 0637 01/24/22 0500 01/24/22 1504 01/25/22 0548 01/26/22 0306  NA 137 138 136 138 134* 132*  --  131* 126* 130* 129* 129* 131*  --  134* 134* 135 136 136 138  K 3.9 3.7 3.6 3.3* 3.3* 2.6*  --  3.1* 3.1* 3.3* 3.6 3.9 3.7  --  3.8 3.7 3.8 3.9 3.6 3.4*  CL 108 109 107 107 106 102  --  102 102 100 96* 97* 96*  --  99 99 100 100 99 102  CO2 20* 20* '24 24 22 22  '$ --  22 21* '25 24 25 26  '$ --  '28 26 27 28 30 29  '$ GLUCOSE 157* 158* 125* 98 113* 112*  --  120* 112* 145* 121* 110* 106*  --  145* 119* 105* 95 105* 112*  BUN 43* 31* 23 28* 43* 34*  --  41* 54* 39* 47* 60* 77*  --  59* 69* 78* 80* 47* 52*  CREATININE 2.56* 2.08* 1.64* 2.09* 2.75* 2.08*  --  2.33* 2.57* 1.92* 2.19* 2.43* 2.73*  --  2.14* 2.48* 2.74* 2.71* 1.94* 2.16*  CALCIUM 7.5* 7.4* 7.3* 7.4* 7.7* 7.1*  --  7.3*  7.1* 7.1* 7.4* 7.5* 7.7*  --  7.8* 7.9* 8.2* 8.3* 8.0* 8.4*  MG  --  2.4  --  2.1 2.1 1.5*  --  1.9 1.8  --  1.8 2.0 2.1 1.9  --  2.1 2.3  --  2.0 2.2  PHOS 2.3* 1.9* 1.6* 2.0* 1.4* 2.6 2.5  --  3.2  --  3.0 3.8 4.4  --  3.7 3.6 4.3 4.4  --   --   ALBUMIN 1.9* 1.9* 1.8* 1.8* 1.8*  --   --   --   --   --   --   --  1.7*  --  1.7* 1.7* 1.8* 1.9*  --   --       Liver Function Tests: Recent Labs  Lab 01/21/22 0645 01/22/22 0814 01/23/22 0637 01/24/22 0500 01/24/22 1504  AST 16  --   --  15  --   ALT 16  --   --  15  --   ALKPHOS 93  --   --  88  --   BILITOT 0.3  --   --  0.6  --   PROT 4.7*  --   --  5.1*  --   ALBUMIN 1.7*   < > 1.7* 1.8* 1.9*   < > = values in this interval not displayed.    No results for input(s): "LIPASE", "AMYLASE" in the last 168 hours. No results for input(s): "AMMONIA" in the last 168 hours. CBC: Recent Labs    01/23/22 0637 01/24/22 0500  01/24/22 1504 01/25/22 0548 01/26/22 0306  HGB 7.4* 7.6* 7.6* 7.2* 7.2*  MCV 88.4 89.1 90.5 89.6 90.4  VITAMINB12  --  981*  --   --   --   FOLATE  --  11.8  --   --   --   FERRITIN  --  111  --   --   --   TIBC  --  262  --   --   --   IRON  --  24*  --   --   --   RETICCTPCT  --  3.4*  --   --   --      Cardiac Enzymes: No results for input(s): "CKTOTAL", "CKMB", "CKMBINDEX", "TROPONINI" in the last 168 hours. CBG: Recent Labs  Lab 01/25/22 0752 01/25/22 1144 01/25/22 1812 01/26/22 0028 01/26/22 0558  GLUCAP 119* 146* 100* 116* 105*     Iron Studies:  Recent Labs    01/24/22 0500  IRON 24*  TIBC 262  FERRITIN 111    Studies/Results: No results found.  Medications: Infusions:  sodium chloride Stopped (01/16/22 1822)   sodium chloride Stopped (01/15/22 1255)   heparin 1,600 Units/hr (16/10/96 0454)   TPN CYCLIC-ADULT (ION) 46 mL/hr at 09/81/19 1478   TPN CYCLIC-ADULT (ION)      Scheduled Medications:  sodium chloride   Intravenous Once   amiodarone  400 mg Oral Daily   Followed by   Derrill Memo ON 01/27/2022] amiodarone  200 mg Oral Daily   busPIRone  5 mg Oral BID   Chlorhexidine Gluconate Cloth  6 each Topical Q0600   escitalopram  10 mg Oral Daily   feeding supplement (NEPRO CARB STEADY)  237 mL Oral BID BM   ferrous sulfate  325 mg Oral Q breakfast   folic acid  1 mg Oral Daily   guaiFENesin  600 mg Oral BID   metoprolol tartrate  12.5 mg Oral BID   multivitamin with  minerals  1 tablet Oral Daily   sodium chloride flush  10-40 mL Intracatheter Q12H   sodium chloride flush  3 mL Intravenous Q12H    have reviewed scheduled and prn medications.  Physical Exam: General: Not in distress, on oxygen via nasal cannula Heart:RRR, s1s2 nl Lungs: Basal degree breath sound, no wheezing Abdomen:soft, Non-tender, non-distended Extremities: Upper extremities edema noted, trace leg edema + Dialysis Access: Left IJ temporary HD catheter in place  [name] [name]  [name] 01/26/2022,10:43 AM  LOS: 19 days

## 2022-01-26 NOTE — Progress Notes (Signed)
PHARMACY - TOTAL PARENTERAL NUTRITION CONSULT NOTE   Indication: Prolonged ileus  Patient Measurements: Height: '5\' 4"'$  (162.6 cm) Weight: 110.2 kg (242 lb 15.2 oz) IBW/kg (Calculated) : 54.7 TPN AdjBW (KG): 67.8 Body mass index is 41.7 kg/m.  Assessment: 76 yo female with septic shock, high-grade small bowel obstruction. Underwent surgery on 8/17 with ex lap and lysis of adhesions for SBO. Reported vomiting and diarrhea since 8/11 up until surgery on 8/17. Pharmacy consulted for TPN due to prolonged ileus.  9/1[name] with provider and nurse surrounding oral intake. Patient may need assistance with oral intake, as she was unaware her breakfast was bedside yesterday morning. She prefers wheaties and RN plans to mix some of the Nepro Carb Steady to this throughout the day to increase intake. Overall, reports not feeling hungry and loses appetite after a few bites.  9/2: Encouraging po intake as much as possible - pt taking some of Nepro shakes, cream of wheat, jello and [place] ices.  Glucose / Insulin: no hx DM - CBGs < 140, off SSI Electrolytes: K down to 3.4, CoCa 10.1. Renal: 8/19-8/21 CRRT >> 8/23 HD (last on 8/31), BUN 52 Hepatic: AST / ALT / tbili wnl, TG 35, albumin 1.9 Intake / Output; MIVF: UOP 300/24h. LBM 9/1 GI Imaging:  8/16 KUB: high grade bowel obstruction  8/24 CT abd: decr gas distention, anasarca throughout abd wall, distention of lower abdominal/pelvic small bowel loops  8/24 KUB: persistent SBO vs. post-op ileus, suspected ascites   GI Surgeries / Procedures:  8/17 with ex lap and LOA for SBO  Central access: triple lumen CVC placed 01/10/22 TPN start date: 01/14/22  Nutritional Goals:  RD Estimated Needs Total Energy Estimated Needs: 1700-1900 Total Protein Estimated Needs: 100-120 gm Total Fluid Estimated Needs: 1L + UOP  Current Nutrition:  TPN 8/27 CLD 8/28 DYS 1 8/30 DYS 3    Plan:  Continue cycle concentrated TPN at half goal (rate 23-46 ml/hr, GIR  0.58-1.16 mg/kg/min) which provides 59g AA and 960 Kcal, meeting 50% estimated needs with goal to wean Electrolytes in TPN: Na 150 mEq/L (Max),  Ca 5 mEq/L, Mg 7 mEq/L, Phos 2 mmol/L, UO:HFGB 1:1. Increase K to 30 mEq/L. Continue MVI and trace elements to enteral given full dysphagia diet Monitor TPN labs on Mon/Thurs F/u PO intake/diet advancement and ability to d/c TPN  Sherlon Handing, PharmD, BCPS Please see amion for complete clinical pharmacist phone list 01/26/2022 7:26 AM

## 2022-01-26 NOTE — Progress Notes (Signed)
Received patient in bed to unit. Alert and oriented. Informed consent signed and in chart.   Treatment initiated: 1514  Treatment completed: 1847  Patient tolerated well. Transported back to the room Alert, without acute distress. Hand-off given to patient's nurse. Tolerated 1 unit of PRBCs with no complications.   Access used: RIJ  Access issues: none  Total UF removed: 2.8L Medication(s) given: none Post HD VS: 119/17 68 98% 16 98 Post HD weight: 106.9 kg    [facility]

## 2022-01-26 NOTE — Progress Notes (Signed)
Attempted to hang TPN but pt is currently at dialysis, is finished but waiting for transport back to room.

## 2022-01-26 NOTE — TOC Progression Note (Signed)
Transition of Care [facility]) - Initial/Assessment Note    Patient Details  Name: [name] MRN: [id number] Date of Birth: [birth year]  Transition [facility]) CM/SW Contact:    Milinda Antis, [place] Phone Number: 01/26/2022, 10:46 AM  Clinical Narrative:                 CSW met with the patient at bedside to present bed offers.  The patient requested to review the list prior to making a choice.  List of offers was left a patient's bedside.  TOC will continue to follow.    Expected Discharge Plan: Home/Self Care Barriers to Discharge: Continued Medical Work up   Patient Goals and CMS Choice        Expected Discharge Plan and Services Expected Discharge Plan: Home/Self Care                                              Prior Living Arrangements/Services                       Activities of Daily Living Home Assistive Devices/Equipment: Environmental consultant (specify type) ADL Screening (condition at time of admission) Patient's cognitive ability adequate to safely complete daily activities?: Yes Is the patient deaf or have difficulty hearing?: Yes Does the patient have difficulty seeing, even when wearing glasses/contacts?: No Does the patient have difficulty concentrating, remembering, or making decisions?: No Patient able to express need for assistance with ADLs?: Yes Does the patient have difficulty dressing or bathing?: No Independently performs ADLs?: Yes (appropriate for developmental age) Does the patient have difficulty walking or climbing stairs?: Yes Weakness of Legs: Both Weakness of Arms/Hands: None  Permission Sought/Granted                  Emotional Assessment              Admission diagnosis:  Partial small bowel obstruction ([place]) [K56.600] Patient Active Problem List   Diagnosis Date Noted   Malnutrition of moderate degree 01/14/2022   Respiratory failure requiring intubation ([place]) 01/11/2022    Class: Acute   Acute respiratory  failure with hypoxia and hypercapnia ([place]) 01/10/2022   Septic shock ([place]) 01/10/2022   Pressure injury of skin 01/10/2022   AKI (acute kidney injury) ([place]) 01/09/2022   Small bowel obstruction ([place]) 01/07/2022   Abnormal weight loss 11/28/2021   Family history of malignant neoplasm of digestive organs 11/28/2021   Intestinal malabsorption 11/28/2021   Renal insufficiency 12/08/2020   Degeneration of lumbar intervertebral disc 01/07/2020   Venous (peripheral) insufficiency 09/30/2019   Anemia 08/12/2017   Vitamin D deficiency 08/12/2017   Lumbar spondylosis with myelopathy 06/03/2017   Primary osteoarthritis of both knees 03/19/2017   Anxiety 02/17/2017   History of pulmonary embolus (PE) 07/09/2016   COPD (chronic obstructive pulmonary disease) ([place]) 01/18/2016   Hyperlipidemia 01/18/2016   Depression 01/18/2016   Essential hypertension 01/18/2016   H/O spinal fusion 01/18/2016   Hypothyroidism (acquired) 01/18/2016   Thoracic aorta atherosclerosis ([place]) 01/18/2016   Chronic cholecystitis with calculus 10/04/2013   PCP:  Chevis Pretty, FNP Pharmacy:   [place], [address] [place] [address] Phone: [phone] Fax: [phone]  [facility] [phone] - [place], [place] [address]  [address] [place] [address] Phone: [phone] Fax: [phone]  [id number] [place]: [phone]: [phone]    Social Determinants of Health (SDOH) Interventions    Readmission Risk Interventions     No data to display

## 2022-01-27 DIAGNOSIS — J9690 Respiratory failure, unspecified, unspecified whether with hypoxia or hypercapnia: Secondary | ICD-10-CM | POA: Diagnosis not present

## 2022-01-27 DIAGNOSIS — K56609 Unspecified intestinal obstruction, unspecified as to partial versus complete obstruction: Secondary | ICD-10-CM | POA: Diagnosis not present

## 2022-01-27 DIAGNOSIS — J449 Chronic obstructive pulmonary disease, unspecified: Secondary | ICD-10-CM | POA: Diagnosis not present

## 2022-01-27 DIAGNOSIS — A419 Sepsis, unspecified organism: Secondary | ICD-10-CM | POA: Diagnosis not present

## 2022-01-27 LAB — RENAL FUNCTION PANEL
Albumin: 1.8 g/dL — ABNORMAL LOW (ref 3.5–5.0)
Anion gap: 12 (ref 5–15)
BUN: 41 mg/dL — ABNORMAL HIGH (ref 8–23)
CO2: 29 mmol/L (ref 22–32)
Calcium: 8.4 mg/dL — ABNORMAL LOW (ref 8.9–10.3)
Chloride: 99 mmol/L (ref 98–111)
Creatinine, Ser: 1.72 mg/dL — ABNORMAL HIGH (ref 0.44–1.00)
GFR, Estimated: 31 mL/min — ABNORMAL LOW (ref >60)
Glucose, Bld: 113 mg/dL — ABNORMAL HIGH (ref 70–99)
Phosphorus: 2.9 mg/dL (ref 2.5–4.6)
Potassium: 3.8 mmol/L (ref 3.5–5.1)
Sodium: 140 mmol/L (ref 135–145)

## 2022-01-27 LAB — TYPE AND SCREEN
ABO/RH(D): O POS
Antibody Screen: NEGATIVE
Unit division: 0

## 2022-01-27 LAB — BPAM RBC
Blood Product Expiration Date: 202310022359
ISSUE DATE / TIME: 202309021550
Unit Type and Rh: 5100

## 2022-01-27 LAB — CBC
HCT: 27.4 % — ABNORMAL LOW (ref 36.0–46.0)
Hemoglobin: 8.5 g/dL — ABNORMAL LOW (ref 12.0–15.0)
MCH: 27.8 pg (ref 26.0–34.0)
MCHC: 31 g/dL (ref 30.0–36.0)
MCV: 89.5 fL (ref 80.0–100.0)
Platelets: 519 10*3/uL — ABNORMAL HIGH (ref 150–400)
RBC: 3.06 MIL/uL — ABNORMAL LOW (ref 3.87–5.11)
RDW: 18.8 % — ABNORMAL HIGH (ref 11.5–15.5)
WBC: 7.6 10*3/uL (ref 4.0–10.5)
nRBC: 0 % (ref 0.0–0.2)

## 2022-01-27 LAB — GLUCOSE, CAPILLARY
Glucose-Capillary: 107 mg/dL — ABNORMAL HIGH (ref 70–99)
Glucose-Capillary: 112 mg/dL — ABNORMAL HIGH (ref 70–99)
Glucose-Capillary: 117 mg/dL — ABNORMAL HIGH (ref 70–99)
Glucose-Capillary: 118 mg/dL — ABNORMAL HIGH (ref 70–99)
Glucose-Capillary: 125 mg/dL — ABNORMAL HIGH (ref 70–99)

## 2022-01-27 LAB — HEPARIN LEVEL (UNFRACTIONATED): Heparin Unfractionated: 0.3 IU/mL (ref 0.30–0.70)

## 2022-01-27 LAB — MAGNESIUM: Magnesium: 1.9 mg/dL (ref 1.7–2.4)

## 2022-01-27 MED ORDER — STERILE WATER FOR INJECTION IV SOLN
INTRAVENOUS | Status: AC
  Filled 2022-01-27: qty 390

## 2022-01-27 NOTE — Progress Notes (Addendum)
ANTICOAGULATION CONSULT NOTE - Follow Up Consult  Pharmacy Consult for heparin Indication: atrial fibrillation  Labs: Recent Labs    01/24/22 1504 01/24/22 1504 01/25/22 0548 01/25/22 1657 01/26/22 0306 01/27/22 0536  HGB 7.6*  --  7.2*  --  7.2* 8.5*  HCT 24.7*  --  22.5*  --  22.7* 27.4*  PLT 560*  --  541*  --  526* 519*  HEPARINUNFRC  --    < > 0.79* 0.69 0.57 0.30  CREATININE 2.71*  --  1.94*  --  2.16*  --    < > = values in this interval not displayed.    Assessment 76yo female therapeutic on heparin with level at 0.3 which decreased from 0.57. Hgb trending up and platelets elevated. No signs/symptoms of bleeding and no issues with the line per nursing.  Plan Increase infusion to 1650 units/hr to keep within goal range Monitor heparin level and CBC daily Monitor for signs/symptoms of bleeding  Sandford Craze, PharmD. [facility] Acute Care PGY-1 01/27/2022 8:02 AM

## 2022-01-27 NOTE — TOC Progression Note (Signed)
Transition of Care [facility]) - Progression Note    Patient Details  Name: [name] MRN: [id number] Date of Birth: 1945-06-01  Transition of Care [facility]) CM/SW Contact  Emeterio Reeve, [place] Phone Number: 01/27/2022, 3:34 PM  Clinical Narrative:     CSW met with pt at bedside. CSW inquired about SNF choice. Pt stated she did not review list and did not feel like talking about It today. CSW explained SNF process, pt stated she will have answer tomorrow.   Expected Discharge Plan: Home/Self Care Barriers to Discharge: Continued Medical Work up  Expected Discharge Plan and Services Expected Discharge Plan: Home/Self Care                                               Social Determinants of Health (SDOH) Interventions    Readmission Risk Interventions     No data to display

## 2022-01-27 NOTE — Progress Notes (Addendum)
PHARMACY - TOTAL PARENTERAL NUTRITION CONSULT NOTE   Indication: Prolonged ileus  Patient Measurements: Height: '5\' 4"'$  (162.6 cm) Weight: 103.8 kg (228 lb 13.4 oz) IBW/kg (Calculated) : 54.7 TPN AdjBW (KG): 67.8 Body mass index is 39.28 kg/m.  Assessment: 76 yo female with septic shock, high-grade small bowel obstruction. Underwent surgery on 8/17 with ex lap and lysis of adhesions for SBO. Reported vomiting and diarrhea since 8/11 up until surgery on 8/17. Pharmacy consulted for TPN due to prolonged ileus.  9/1: Encouraging po intake as much as possible - pt taking some of Nepro shakes, cream of wheat, jello, [place] ices. 9/2: Po intake remains poor - cream of wheat, 1/2 Nephro, few bites of peach cobbler  Glucose / Insulin: no hx DM - CBGs < 140, off SSI Electrolytes: K down to 3.4, CoCa 10.1. Renal: 8/19-8/21 CRRT >> 8/23 HD (last on 8/31), BUN 52 Hepatic: AST / ALT / tbili wnl, TG 35, albumin 1.9 Intake / Output; MIVF: UOP 300/24h. LBM 9/1 GI Imaging:  8/16 KUB: high grade bowel obstruction  8/24 CT abd: decr gas distention, anasarca throughout abd wall, distention of lower abdominal/pelvic small bowel loops  8/24 KUB: persistent SBO vs. post-op ileus, suspected ascites   GI Surgeries / Procedures:  8/17 with ex lap and LOA for SBO  Central access: triple lumen CVC placed 01/10/22 TPN start date: 01/14/22  Nutritional Goals:  RD Estimated Needs Total Energy Estimated Needs: 1700-1900 Total Protein Estimated Needs: 100-120 gm Total Fluid Estimated Needs: 1L + UOP  Current Nutrition:  TPN 8/30 DYS 3    Plan:  Continue cycle concentrated TPN at half goal (rate 23-46 ml/hr, GIR 0.58-1.16 mg/kg/min) which provides 59g AA and 960 Kcal, meeting 50% estimated needs with goal to wean. Electrolytes in TPN: Na 150 mEq/L (Max), K 30 mEq/L. Ca 5 mEq/L, Mg 7 mEq/L, Phos 2 mmol/L, ZO:XWRU 1:1.  Continue MVI and trace elements enterally given full dysphagia diet Monitor TPN labs on  Mon/Thurs F/u PO intake/diet advancement and ability to d/c TPN  Sherlon Handing, PharmD, BCPS Please see amion for complete clinical pharmacist phone list 01/27/2022 7:59 AM

## 2022-01-27 NOTE — Progress Notes (Signed)
[name] NEPHROLOGY PROGRESS NOTE  Assessment/ Plan: Pt is a 76 y.o. yo female with history of HTN, HLD, hypothyroidism, PE, presented with SBO underwent emergent ex lap with lysis of adhesion on 8/17, developed anterior dependent respiratory failure, AKI requiring CRRT.  #Acute kidney injury, oligoanuric, dialysis dependent: Likely ischemic ATN from shock/sepsis.  She was on ARB prior to admission.  Initially required CRRT which was stopped on 8/21 and initiated IHD on 8/23.  Seen by palliative care team. Status post HD yesterday with around 2.8 L ultrafiltration. Continue to monitor urine output and daily lab.  If no renal recovery in next 1-2 days then she will need tunnel HD catheter and outpatient HD for AKI arrangement.  I briefly discussed this with the patient today.  #Septic shock, off of pressors.  Blood pressure improving.  #High-grade small bowel obstruction now status post ex lap and lysis of adhesion.  On TPN.  General surgery team is following.  #Hyponatremia, hypervolemic: Recommend to minimize free water.  Currently on tube feed.  #Anemia of critical illness: Received a unit of blood transfusion on 8/29 and on 9/2.  Iron saturation is only 9 with anemia.  Treated with IV iron.,  Transfuse as needed..    Subjective: Seen and examined.  Had HD yesterday, tolerated well.  Still not making much urine.  Breathing status looks much better.  Complain of abdominal discomfort.  Objective Vital signs in last 24 hours: Vitals:   01/26/22 2113 01/27/22 0020 01/27/22 0536 01/27/22 0843  BP: 114/71 118/75 117/81 133/68  Pulse: 70 75 72   Resp: '20 19 16 19  '$ Temp: 98.6 F (37 C) (!) 97.5 F (36.4 C) 98.9 F (37.2 C) 98 F (36.7 C)  TempSrc: Oral Oral Oral Oral  SpO2: 96% 96% 93%   Weight:   103.8 kg   Height:       Weight change: 0.2 kg  Intake/Output Summary (Last 24 hours) at 01/27/2022 1040 Last data filed at 01/27/2022 0400 Gross per 24 hour  Intake  1172.72 ml  Output 2.8 ml  Net 1169.92 ml        Labs: RENAL PANEL Recent Labs    01/14/22 0400 01/14/22 1615 01/15/22 0431 01/16/22 0627 01/17/22 0115 01/17/22 0536 01/17/22 1100 01/18/22 0707 01/18/22 1145 01/19/22 0515 01/20/22 0343 01/21/22 0645 01/22/22 0415 01/22/22 6553 01/23/22 7482 01/24/22 0500 01/24/22 1504 01/25/22 0548 01/26/22 0306 01/27/22 0536  NA 138 136 138 134*   < >  --    < > 126*   < > 129* 129* 131*  --  134* 134* 135 136 136 138 140  K 3.7 3.6 3.3* 3.3*   < >  --    < > 3.1*   < > 3.6 3.9 3.7  --  3.8 3.7 3.8 3.9 3.6 3.4* 3.8  CL 109 107 107 106   < >  --    < > 102   < > 96* 97* 96*  --  99 99 100 100 99 102 99  CO2 20* '24 24 22   '$ < >  --    < > 21*   < > '24 25 26  '$ --  '28 26 27 28 30 29 29  '$ GLUCOSE 158* 125* 98 113*   < >  --    < > 112*   < > 121* 110* 106*  --  145* 119* 105* 95 105* 112* 113*  BUN 31* 23 28* 43*   < >  --    < >  54*   < > 47* 60* 77*  --  59* 69* 78* 80* 47* 52* 41*  CREATININE 2.08* 1.64* 2.09* 2.75*   < >  --    < > 2.57*   < > 2.19* 2.43* 2.73*  --  2.14* 2.48* 2.74* 2.71* 1.94* 2.16* 1.72*  CALCIUM 7.4* 7.3* 7.4* 7.7*   < >  --    < > 7.1*   < > 7.4* 7.5* 7.7*  --  7.8* 7.9* 8.2* 8.3* 8.0* 8.4* 8.4*  MG 2.4  --  2.1 2.1   < >  --    < > 1.8  --  1.8 2.0 2.1 1.9  --  2.1 2.3  --  2.0 2.2 1.9  PHOS 1.9* 1.6* 2.0* 1.4*   < > 2.5  --  3.2  --  3.0 3.8 4.4  --  3.7 3.6 4.3 4.4  --   --  2.9  ALBUMIN 1.9* 1.8* 1.8* 1.8*  --   --   --   --   --   --   --  1.7*  --  1.7* 1.7* 1.8* 1.9*  --   --  1.8*   < > = values in this interval not displayed.      Liver Function Tests: Recent Labs  Lab 01/21/22 0645 01/22/22 0814 01/24/22 0500 01/24/22 1504 01/27/22 0536  AST 16  --  15  --   --   ALT 16  --  15  --   --   ALKPHOS 93  --  88  --   --   BILITOT 0.3  --  0.6  --   --   PROT 4.7*  --  5.1*  --   --   ALBUMIN 1.7*   < > 1.8* 1.9* 1.8*   < > = values in this interval not displayed.    No results for input(s):  "LIPASE", "AMYLASE" in the last 168 hours. No results for input(s): "AMMONIA" in the last 168 hours. CBC: Recent Labs    01/24/22 0500 01/24/22 1504 01/25/22 0548 01/26/22 0306 01/27/22 0536  HGB 7.6* 7.6* 7.2* 7.2* 8.5*  MCV 89.1 90.5 89.6 90.4 89.5  VITAMINB12 981*  --   --   --   --   FOLATE 11.8  --   --   --   --   FERRITIN 111  --   --   --   --   TIBC 262  --   --   --   --   IRON 24*  --   --   --   --   RETICCTPCT 3.4*  --   --   --   --      Cardiac Enzymes: No results for input(s): "CKTOTAL", "CKMB", "CKMBINDEX", "TROPONINI" in the last 168 hours. CBG: Recent Labs  Lab 01/26/22 0028 01/26/22 0558 01/26/22 1318 01/27/22 0018 01/27/22 0656  GLUCAP 116* 105* 117* 117* 112*     Iron Studies: No results for input(s): "IRON", "TIBC", "TRANSFERRIN", "FERRITIN" in the last 72 hours.  Studies/Results: No results found.  Medications: Infusions:  sodium chloride Stopped (01/15/22 1255)   anticoagulant sodium citrate     heparin 1,650 Units/hr (91/47/82 9562)   TPN CYCLIC-ADULT (ION) 46 mL/hr at 13/08/65 7846   TPN CYCLIC-ADULT (ION)      Scheduled Medications:  sodium chloride   Intravenous Once   amiodarone  200 mg Oral Daily   busPIRone  5 mg Oral BID  Chlorhexidine Gluconate Cloth  6 each Topical Q0600   escitalopram  10 mg Oral Daily   feeding supplement (NEPRO CARB STEADY)  237 mL Oral BID BM   ferrous sulfate  325 mg Oral Q breakfast   folic acid  1 mg Oral Daily   guaiFENesin  600 mg Oral BID   metoprolol tartrate  12.5 mg Oral BID   multivitamin with minerals  1 tablet Oral Daily   sodium chloride flush  10-40 mL Intracatheter Q12H    have reviewed scheduled and prn medications.  Physical Exam: General: Not in distress, on oxygen via nasal cannula Heart:RRR, s1s2 nl Lungs: bibasal decreased breath sound Abdomen:soft, Non-tender, non-distended Extremities: Upper extremities edema noted, trace leg edema + Dialysis Access: Left IJ temporary  HD catheter in place  [name] 01/27/2022,10:40 AM  LOS: 20 days

## 2022-01-27 NOTE — Progress Notes (Signed)
PROGRESS NOTE  [name] MGQ:[id number] DOB: [birth year]   PCP: Chevis Pretty, FNP  Patient is from: Home  DOA: 01/07/2022 LOS: 28  Chief complaints Chief Complaint  Patient presents with   Abdominal Pain     Brief Narrative / Interim history: 76 year old F with PMH of PE, HTN, HLD, hypothyroidism, osteoarthritis and depression returned to [facility] ED with nausea, vomiting and diarrhea and found to small bowel obstruction.  She then developed AKI and septic shock and underwent emergent ex lap with lysis of adhesion on 8/17.  Postoperatively, she remained intubated, started on CRRT and transferred to [facility] on 8/19.  Eventually extubated on 8/21, and started on TNA.  She is started intermittent HD on 8/23.   Hospital course complicated by A-fib with RVR and diarrhea.  Cardiology consulted and she was started on IV amiodarone for A-fib with RVR.  In regards to diarrhea, C. difficile negative.  GIP positive for STEC.  Diarrhea improving.  Patient had repeat CT abdomen as part of evaluation for worsening abdominal pain that was negative for abscess but moderate bilateral pleural effusion.  She underwent right thoracocentesis with removal of 1.1 L on 8/25.  General surgery and nephrology following.  SLP recommended dysphagia 3 diet after FEES on 8/30.  TPN at half rate.  Disposition likely SNF once cleared by nephrology and general surgery.  Dialysis as needed.  Subjective: Seen and examined earlier this morning.  No major events overnight of this morning.  Feels a little bloated.  No other complaints.  She denies chest pain, dyspnea, nausea, vomiting or abdominal pain.  Thinks she had a bowel movement yesterday.  Objective: Vitals:   01/27/22 0020 01/27/22 0536 01/27/22 0843 01/27/22 1328  BP: 118/75 117/81 133/68   Pulse: 75 72    Resp: '19 16 19   '$ Temp: (!) 97.5 F (36.4 C) 98.9 F (37.2 C) 98 F (36.7 C) 97.9 F (36.6 C)  TempSrc: Oral Oral Oral Oral  SpO2: 96%  93%    Weight:  103.8 kg    Height:        Examination:  GENERAL: No apparent distress.  Nontoxic. HEENT: MMM.  Vision and hearing grossly intact.  NECK: Supple.  No apparent JVD.  RESP:  No IWOB.  Fair aeration bilaterally. CVS:  RRR. Heart sounds normal.  ABD/GI/GU: BS+. Abd soft, NTND.  Laparotomy wound healing. MSK/EXT:  Moves extremities. No apparent deformity.  Trace BLE edema.  Ace wrap over LUE. SKIN: no apparent skin lesion or wound NEURO: Sleepy but wakes to voice.  Oriented appropriately.  No apparent focal neuro deficit. PSYCH: Calm. Normal affect.  Procedures:  8/17-laparotomy with lysis of adhesion   Microbiology summarized: 8/14-urine culture with multiple species 8/17-MRSA PCR screen negative 8/17-blood cultures NGTD 8/19-MRSA PCR screen negative 8/26-C. difficile PCR negative 8/25-GIP with STEC   Assessment and plan: Principal Problem:   Septic shock ([place]) Active Problems:   Respiratory failure requiring intubation (HCC)   Small bowel obstruction (HCC)   COPD (chronic obstructive pulmonary disease) ([place])   Hyperlipidemia   Hypothyroidism (acquired)   Essential hypertension   History of pulmonary embolus (PE)   AKI (acute kidney injury) ([place])   Acute respiratory failure with hypoxia and hypercapnia (HCC)   Pressure injury of skin   Malnutrition of moderate degree  Septic shock in the setting of mall bowel obstruction and intra-abdominal infection/STEC: Resolved. -Completed antibiotic course with aztreonam and metronidazole on 8/27 -Repeat CT on 8/24 negative for abscess and decreased  bowel distention  SBO status post laparotomy 8/17 -Dysphagia 3 diet on 8/30>> -Now on 1/2 rate TPN and nepro shakes   Acute Metabolic Encephalopathy: In the setting of septic shock and SBO: Resolved.  Oriented x4. -Reorientation and delirium precaution   Paroxysmal A-fib with RVR/PVCs: Currently rate controlled. -Continue metoprolol and amiodarone -On IV heparin  for anticoagulation.  We will transition to p.o. agent prior to discharge -Cardiology signed off.   Acute hypoxic respiratory failure/pulmonary edema/bilateral pleural effusion: Improving. -S/p right thoracocentesis on 8/25 by PCCM -Fluid management by HD per nephrology -Wean off oxygen.  Incentive spirometry and flutter valve.  AKI/azotemia: Likely from ATN in the setting of sepsis. Recent Labs    01/19/22 0515 01/20/22 0343 01/21/22 0645 01/22/22 0814 01/23/22 0637 01/24/22 0500 01/24/22 1504 01/25/22 0548 01/26/22 0306 01/27/22 0536  BUN 47* 60* 77* 59* 69* 78* 80* 47* 52* 41*  CREATININE 2.19* 2.43* 2.73* 2.14* 2.48* 2.74* 2.71* 1.94* 2.16* 1.72*  -CRRT discontinued on 8/21.  IHD started on 8/23. -Nephrology following-recommends IHD as needed -Monitor intake and output -Avoid nephrotoxic meds     Infectious diarrhea/gastroenteritis due to STEC: Diarrhea resolved.   Iron deficiency anemia superimposed on anemia of chronic disease: Transfused on 8/21 and 9/2. Recent Labs    01/19/22 0730 01/20/22 0343 01/21/22 0645 01/22/22 0429 01/23/22 0637 01/24/22 0500 01/24/22 1504 01/25/22 0548 01/26/22 0306 01/27/22 0536  HGB 7.4* 7.2* 7.3* 7.1* 7.4* 7.6* 7.6* 7.2* 7.2* 8.5*  -Continue monitoring   Hyponatremia, hypokalemia, hypophosphatemia: Resolved. -Monitor and correct as appropriate   Leukocytosis: Likely demargination.  Resolved.  Morbid obesity Body mass index is 39.28 kg/m.  Moderate malnutrition Nutrition Problem: Moderate Malnutrition Etiology: acute illness (SBO) Signs/Symptoms: energy intake < 75% for > 7 days, mild muscle depletion, moderate fat depletion Interventions: MVI, TPN, Nepro shake  Pressure skin injury: POA Pressure Injury 01/10/22 Coccyx Medial;Mid Stage 2 -  Partial thickness loss of dermis presenting as a shallow open injury with a red, pink wound bed without slough. pink (Active)  01/10/22 1200  Location: Coccyx  Location  Orientation: Medial;Mid  Staging: Stage 2 -  Partial thickness loss of dermis presenting as a shallow open injury with a red, pink wound bed without slough.  Wound Description (Comments): pink  Present on Admission:   Dressing Type Foam - Lift dressing to assess site every shift 01/26/22 2100     Pressure Injury 01/12/22 Face Left Stage 2 -  Partial thickness loss of dermis presenting as a shallow open injury with a red, pink wound bed without slough. red ulceration beneath ETT holder on L cheek (Active)  01/12/22 1815  Location: Face  Location Orientation: Left  Staging: Stage 2 -  Partial thickness loss of dermis presenting as a shallow open injury with a red, pink wound bed without slough.  Wound Description (Comments): red ulceration beneath ETT holder on L cheek  Present on Admission: Yes  Dressing Type Foam - Lift dressing to assess site every shift 01/26/22 0845     Pressure Injury 01/16/22 Lumbar Left (Active)  01/16/22 1230  Location: Lumbar  Location Orientation: Left  Staging:   Wound Description (Comments):   Present on Admission: Yes  Dressing Type Foam - Lift dressing to assess site every shift 01/26/22 0845     Pressure Injury 01/16/22 Buttocks Left (Active)  01/16/22 1230  Location: Buttocks  Location Orientation: Left  Staging:   Wound Description (Comments):   Present on Admission: Yes  Dressing Type Foam - Lift  dressing to assess site every shift 01/26/22 0845   DVT prophylaxis:  Place and maintain sequential compression device Start: 01/20/22 0901  Code Status: DNR/DNI Family Communication: None at bedside Level of care: Telemetry Medical Status is: Inpatient Remains inpatient appropriate because: Acute kidney failure requiring dialysis   Final disposition: Likely SNF once medically cleared. Consultants:  Pulmonology Nephrology Cardiology  Sch Meds:  Scheduled Meds:  sodium chloride   Intravenous Once   amiodarone  200 mg Oral Daily   busPIRone   5 mg Oral BID   Chlorhexidine Gluconate Cloth  6 each Topical Q0600   escitalopram  10 mg Oral Daily   feeding supplement (NEPRO CARB STEADY)  237 mL Oral BID BM   ferrous sulfate  325 mg Oral Q breakfast   folic acid  1 mg Oral Daily   guaiFENesin  600 mg Oral BID   metoprolol tartrate  12.5 mg Oral BID   multivitamin with minerals  1 tablet Oral Daily   sodium chloride flush  10-40 mL Intracatheter Q12H   Continuous Infusions:  sodium chloride Stopped (01/15/22 1255)   anticoagulant sodium citrate     heparin 1,650 Units/hr (98/11/91 4782)   TPN CYCLIC-ADULT (ION)     PRN Meds:.acetaminophen, albuterol, alteplase, alteplase, anticoagulant sodium citrate, guaiFENesin-dextromethorphan, heparin, lidocaine (PF), lidocaine-prilocaine, lip balm, [DISCONTINUED] ondansetron **OR** ondansetron (ZOFRAN) IV, mouth rinse, oxyCODONE, pentafluoroprop-tetrafluoroeth, sodium chloride flush  Antimicrobials: Anti-infectives (From admission, onward)    Start     Dose/Rate Route Frequency Ordered Stop   01/16/22 2000  aztreonam (AZACTAM) 1 g in sodium chloride 0.9 % 100 mL IVPB  Status:  Discontinued        1 g 200 mL/hr over 30 Minutes Intravenous Every 8 hours 01/16/22 1840 01/20/22 1254   01/16/22 2000  metroNIDAZOLE (FLAGYL) IVPB 500 mg  Status:  Discontinued        500 mg 100 mL/hr over 60 Minutes Intravenous Every 8 hours 01/16/22 1840 01/20/22 1254   01/15/22 0526  aztreonam (AZACTAM) 1 g in sodium chloride 0.9 % 100 mL IVPB  Status:  Discontinued       See Hyperspace for full Linked Orders Report.   1 g 200 mL/hr over 30 Minutes Intravenous Every 24 hours 01/14/22 1529 01/14/22 1534   01/15/22 0526  aztreonam (AZACTAM) 1 g in sodium chloride 0.9 % 100 mL IVPB       See Hyperspace for full Linked Orders Report.   1 g 200 mL/hr over 30 Minutes Intravenous Every 24 hours 01/14/22 1534 01/15/22 0504   01/13/22 2100  vancomycin (VANCOCIN) IVPB 1000 mg/200 mL premix  Status:  Discontinued         1,000 mg 200 mL/hr over 60 Minutes Intravenous Every 24 hours 01/12/22 2135 01/13/22 1027   01/13/22 0500  aztreonam (AZACTAM) 2 g in sodium chloride 0.9 % 100 mL IVPB  Status:  Discontinued       See Hyperspace for full Linked Orders Report.   2 g 200 mL/hr over 30 Minutes Intravenous Every 12 hours 01/12/22 2135 01/14/22 1529   01/12/22 2015  vancomycin (VANCOREADY) IVPB 1750 mg/350 mL        1,750 mg 175 mL/hr over 120 Minutes Intravenous  Once 01/12/22 1921 01/12/22 2259   01/11/22 1800  aztreonam (AZACTAM) 1 g in sodium chloride 0.9 % 100 mL IVPB  Status:  Discontinued       See Hyperspace for full Linked Orders Report.   1 g 200 mL/hr over 30  Minutes Intravenous Every 8 hours 01/11/22 0815 01/12/22 2135   01/11/22 1000  levofloxacin (LEVAQUIN) IVPB 500 mg  Status:  Discontinued        500 mg 100 mL/hr over 60 Minutes Intravenous Every 48 hours 01/10/22 1432 01/11/22 0744   01/11/22 0915  aztreonam (AZACTAM) 2 g in sodium chloride 0.9 % 100 mL IVPB       See Hyperspace for full Linked Orders Report.   2 g 200 mL/hr over 30 Minutes Intravenous  Once 01/11/22 0815 01/11/22 1045   01/11/22 0845  aztreonam (AZACTAM) 2 g in sodium chloride 0.9 % 100 mL IVPB  Status:  Discontinued        2 g 200 mL/hr over 30 Minutes Intravenous  Once 01/11/22 0747 01/11/22 0815   01/11/22 0845  metroNIDAZOLE (FLAGYL) IVPB 500 mg  Status:  Discontinued        500 mg 100 mL/hr over 60 Minutes Intravenous Every 12 hours 01/11/22 0747 01/15/22 0851   01/10/22 1900  levofloxacin (LEVAQUIN) IVPB 500 mg  Status:  Discontinued        500 mg 100 mL/hr over 60 Minutes Intravenous Every 24 hours 01/10/22 1426 01/10/22 1432   01/10/22 0700  ciprofloxacin (CIPRO) IVPB 400 mg        400 mg 200 mL/hr over 60 Minutes Intravenous On call to O.R. 01/10/22 0612 01/10/22 1052   01/10/22 0700  metroNIDAZOLE (FLAGYL) IVPB 500 mg  Status:  Discontinued        500 mg 100 mL/hr over 60 Minutes Intravenous On call to O.R.  01/10/22 0612 01/10/22 1123   01/08/22 0830  Levofloxacin (LEVAQUIN) IVPB 250 mg  Status:  Discontinued        250 mg 50 mL/hr over 60 Minutes Intravenous Every 24 hours 01/08/22 0724 01/08/22 0727   01/08/22 0830  Levofloxacin (LEVAQUIN) IVPB 250 mg  Status:  Discontinued        250 mg 50 mL/hr over 60 Minutes Intravenous Every 48 hours 01/08/22 0727 01/08/22 0732   01/08/22 0830  Levofloxacin (LEVAQUIN) IVPB 250 mg  Status:  Discontinued        250 mg 50 mL/hr over 60 Minutes Intravenous Every 24 hours 01/08/22 0732 01/10/22 1426        I have personally reviewed the following labs and images: CBC: Recent Labs  Lab 01/24/22 0500 01/24/22 1504 01/25/22 0548 01/26/22 0306 01/27/22 0536  WBC 11.2* 10.9* 9.1 8.4 7.6  HGB 7.6* 7.6* 7.2* 7.2* 8.5*  HCT 23.8* 24.7* 22.5* 22.7* 27.4*  MCV 89.1 90.5 89.6 90.4 89.5  PLT 532* 560* 541* 526* 519*   BMP &GFR Recent Labs  Lab 01/22/22 0814 01/23/22 0637 01/24/22 0500 01/24/22 1504 01/25/22 0548 01/26/22 0306 01/27/22 0536  NA 134* 134* 135 136 136 138 140  K 3.8 3.7 3.8 3.9 3.6 3.4* 3.8  CL 99 99 100 100 99 102 99  CO2 '28 26 27 28 30 29 29  '$ GLUCOSE 145* 119* 105* 95 105* 112* 113*  BUN 59* 69* 78* 80* 47* 52* 41*  CREATININE 2.14* 2.48* 2.74* 2.71* 1.94* 2.16* 1.72*  CALCIUM 7.8* 7.9* 8.2* 8.3* 8.0* 8.4* 8.4*  MG  --  2.1 2.3  --  2.0 2.2 1.9  PHOS 3.7 3.6 4.3 4.4  --   --  2.9   Estimated Creatinine Clearance: 33.1 mL/min (A) (by C-G formula based on SCr of 1.72 mg/dL (H)). Liver & Pancreas: Recent Labs  Lab 01/21/22 0645 01/22/22 3086 01/23/22  3016 01/24/22 0500 01/24/22 1504 01/27/22 0536  AST 16  --   --  15  --   --   ALT 16  --   --  15  --   --   ALKPHOS 93  --   --  88  --   --   BILITOT 0.3  --   --  0.6  --   --   PROT 4.7*  --   --  5.1*  --   --   ALBUMIN 1.7* 1.7* 1.7* 1.8* 1.9* 1.8*   No results for input(s): "LIPASE", "AMYLASE" in the last 168 hours. No results for input(s): "AMMONIA" in the  last 168 hours. Diabetic: No results for input(s): "HGBA1C" in the last 72 hours. Recent Labs  Lab 01/26/22 0558 01/26/22 1318 01/27/22 0018 01/27/22 0656 01/27/22 1325  GLUCAP 105* 117* 117* 112* 107*   Cardiac Enzymes: No results for input(s): "CKTOTAL", "CKMB", "CKMBINDEX", "TROPONINI" in the last 168 hours. No results for input(s): "PROBNP" in the last 8760 hours. Coagulation Profile: No results for input(s): "INR", "PROTIME" in the last 168 hours. Thyroid Function Tests: No results for input(s): "TSH", "T4TOTAL", "FREET4", "T3FREE", "THYROIDAB" in the last 72 hours. Lipid Profile: No results for input(s): "CHOL", "HDL", "LDLCALC", "TRIG", "CHOLHDL", "LDLDIRECT" in the last 72 hours.  Anemia Panel: No results for input(s): "VITAMINB12", "FOLATE", "FERRITIN", "TIBC", "IRON", "RETICCTPCT" in the last 72 hours.  Urine analysis:    Component Value Date/Time   COLORURINE YELLOW 01/11/2022 1051   APPEARANCEUR CLOUDY (A) 01/11/2022 1051   APPEARANCEUR Clear 03/01/2021 1257   LABSPEC 1.010 01/11/2022 1051   PHURINE 5.0 01/11/2022 1051   GLUCOSEU NEGATIVE 01/11/2022 1051   HGBUR SMALL (A) 01/11/2022 1051   BILIRUBINUR NEGATIVE 01/11/2022 1051   BILIRUBINUR Negative 03/01/2021 [place] 01/11/2022 1051   PROTEINUR NEGATIVE 01/11/2022 1051   NITRITE NEGATIVE 01/11/2022 1051   LEUKOCYTESUR LARGE (A) 01/11/2022 1051   Sepsis Labs: Invalid input(s): "PROCALCITONIN", "LACTICIDVEN"  Microbiology: Recent Results (from the past 240 hour(s))  Gastrointestinal Panel by PCR , Stool     Status: Abnormal   Collection Time: 01/18/22  3:42 PM   Specimen: Stool  Result Value Ref Range Status   Campylobacter species NOT DETECTED NOT DETECTED Final   Plesimonas shigelloides NOT DETECTED NOT DETECTED Final   Salmonella species NOT DETECTED NOT DETECTED Final   Yersinia enterocolitica NOT DETECTED NOT DETECTED Final   Vibrio species NOT DETECTED NOT DETECTED Final    Vibrio cholerae NOT DETECTED NOT DETECTED Final   Enteroaggregative E coli (EAEC) NOT DETECTED NOT DETECTED Final   Enterotoxigenic E coli (ETEC) NOT DETECTED NOT DETECTED Final   Shiga like toxin producing E coli (STEC) DETECTED (A) NOT DETECTED Final    Comment: RESULT CALLED TO, READ BACK BY AND VERIFIED WITH: NAA DORMON 01/20/22 1417 AMK    E. coli O157 NOT DETECTED NOT DETECTED Final   Shigella/Enteroinvasive E coli (EIEC) NOT DETECTED NOT DETECTED Final   Cryptosporidium NOT DETECTED NOT DETECTED Final   Cyclospora cayetanensis NOT DETECTED NOT DETECTED Final   Entamoeba histolytica NOT DETECTED NOT DETECTED Final   Giardia lamblia NOT DETECTED NOT DETECTED Final   Adenovirus F40/41 NOT DETECTED NOT DETECTED Final   Astrovirus NOT DETECTED NOT DETECTED Final   Norovirus GI/GII NOT DETECTED NOT DETECTED Final   Rotavirus A NOT DETECTED NOT DETECTED Final   Sapovirus (I, II, IV, and V) NOT DETECTED NOT DETECTED Final    Comment: Performed  at [facility], [place], [place] [address]  C Difficile Quick Screen (NO PCR Reflex)     Status: None   Collection Time: 01/19/22  6:32 PM   Specimen: STOOL  Result Value Ref Range Status   C Diff antigen NEGATIVE NEGATIVE Final   C Diff toxin NEGATIVE NEGATIVE Final   C Diff interpretation No C. difficile detected.  Final    Comment: Performed at [facility], [place] [address], [place], [place] [address]    Radiology Studies: No results found.    Chaun Uemura T. [place]  If 7PM-7AM, please contact night-coverage [email or web address] 01/27/2022, 4:33 PM

## 2022-01-28 DIAGNOSIS — J449 Chronic obstructive pulmonary disease, unspecified: Secondary | ICD-10-CM | POA: Diagnosis not present

## 2022-01-28 DIAGNOSIS — A419 Sepsis, unspecified organism: Secondary | ICD-10-CM | POA: Diagnosis not present

## 2022-01-28 DIAGNOSIS — K56609 Unspecified intestinal obstruction, unspecified as to partial versus complete obstruction: Secondary | ICD-10-CM | POA: Diagnosis not present

## 2022-01-28 DIAGNOSIS — J9690 Respiratory failure, unspecified, unspecified whether with hypoxia or hypercapnia: Secondary | ICD-10-CM | POA: Diagnosis not present

## 2022-01-28 LAB — GLUCOSE, CAPILLARY
Glucose-Capillary: 117 mg/dL — ABNORMAL HIGH (ref 70–99)
Glucose-Capillary: 99 mg/dL (ref 70–99)
Glucose-Capillary: 112 mg/dL — ABNORMAL HIGH (ref 70–99)

## 2022-01-28 LAB — COMPREHENSIVE METABOLIC PANEL
ALT: 17 U/L (ref 0–44)
AST: 16 U/L (ref 15–41)
Albumin: 1.9 g/dL — ABNORMAL LOW (ref 3.5–5.0)
Alkaline Phosphatase: 113 U/L (ref 38–126)
Anion gap: 7 (ref 5–15)
BUN: 45 mg/dL — ABNORMAL HIGH (ref 8–23)
CO2: 29 mmol/L (ref 22–32)
Calcium: 8.2 mg/dL — ABNORMAL LOW (ref 8.9–10.3)
Chloride: 101 mmol/L (ref 98–111)
Creatinine, Ser: 2 mg/dL — ABNORMAL HIGH (ref 0.44–1.00)
GFR, Estimated: 26 mL/min — ABNORMAL LOW (ref >60)
Glucose, Bld: 116 mg/dL — ABNORMAL HIGH (ref 70–99)
Potassium: 3.7 mmol/L (ref 3.5–5.1)
Sodium: 137 mmol/L (ref 135–145)
Total Bilirubin: 0.6 mg/dL (ref 0.3–1.2)
Total Protein: 5.5 g/dL — ABNORMAL LOW (ref 6.5–8.1)

## 2022-01-28 LAB — TRIGLYCERIDES: Triglycerides: 45 mg/dL (ref <150)

## 2022-01-28 LAB — PHOSPHORUS: Phosphorus: 3.2 mg/dL (ref 2.5–4.6)

## 2022-01-28 LAB — HEPARIN LEVEL (UNFRACTIONATED): Heparin Unfractionated: 0.31 IU/mL (ref 0.30–0.70)

## 2022-01-28 LAB — HEMOGLOBIN AND HEMATOCRIT, BLOOD
HCT: 27.3 % — ABNORMAL LOW (ref 36.0–46.0)
Hemoglobin: 8.6 g/dL — ABNORMAL LOW (ref 12.0–15.0)

## 2022-01-28 LAB — MAGNESIUM: Magnesium: 2.1 mg/dL (ref 1.7–2.4)

## 2022-01-28 MED ORDER — STERILE WATER FOR INJECTION IV SOLN
INTRAVENOUS | Status: AC
  Filled 2022-01-28: qty 390

## 2022-01-28 NOTE — TOC Progression Note (Signed)
Transition of Care [facility]) - Progression Note    Patient Details  Name: [name] MRN: [id number] Date of Birth: [birth year]  Transition of Care [facility]) CM/SW [place], [place] Phone Number: 01/28/2022, 5:01 PM  Clinical Narrative:     CSW spoke with patient and patients daughter-n-law at bedside and provided patient with medicare compare accepted SNF bed offers for review. Patients daughter n law informed CSW they spoke with Raquel Sarna at [facility] and may be interested in [facility] for patient. SNF vrs. LTAC. Family agreeable to whatever is best plan for patient. CSW and CM to follow up on plan for patient. CSW will continue to follow and assist with patients dc planning needs.  Expected Discharge Plan: Home/Self Care Barriers to Discharge: Continued Medical Work up  Expected Discharge Plan and Services Expected Discharge Plan: Home/Self Care                                               Social Determinants of Health (SDOH) Interventions    Readmission Risk Interventions     No data to display

## 2022-01-28 NOTE — Progress Notes (Signed)
[facility] Progress Note  Name: [name] MRN: [id number] DOB: 08-24-1945   Subjective:  strict ins/outs are not available.  No urine output is charted for 9/3.  Per nursing she has been having urine output - they are speaking with techs re: same.  Last HD on 9/2 with 2.8 liters UF.  She has about 300 - 350 mL in purewick canister on my exam which appears to be from today.  Her son and daughter in law and grandchildren are at bedside.   Review of systems:  States she is having some shortness of breath but attributes to abdominal discomfort with deep breaths On TPN  They state she is taking minimal PO     Intake/Output Summary (Last 24 hours) at 01/28/2022 1210 Last data filed at 01/27/2022 1945 Gross per 24 hour  Intake 431.16 ml  Output --  Net 431.16 ml    Vitals:  Vitals:   01/27/22 2127 01/27/22 2344 01/28/22 0422 01/28/22 0820  BP: 121/80 129/76 139/85   Pulse: 65 77 77 77  Resp: '16 18 19   '$ Temp: 98.4 F (36.9 C) 98.3 F (36.8 C) 98.3 F (36.8 C) 98.1 F (36.7 C)  TempSrc: Oral Oral Oral Oral  SpO2: 97% 97% 95%   Weight:   106.8 kg   Height:         Physical Exam:  General elderly female in bed in no acute distress HEENT normocephalic atraumatic extraocular movements intact sclera anicteric Neck supple trachea midline Lungs clear but reduced to auscultation bilaterally normal work of breathing at rest on 0.5 liters  Heart S1S2 no rub Abdomen soft nontender nondistended Extremities no edema  Psych normal mood and affect Neuro very hard of hearing and has a device at bedside to help augment sounds; awake and alert and following commands  Access left IJ nontunneled catheter in place   Medications reviewed   Labs:     Latest Ref Rng & Units 01/28/2022    4:26 AM 01/27/2022    5:36 AM 01/26/2022    3:06 AM  BMP  Glucose 70 - 99 mg/dL 116  113  112   BUN 8 - 23 mg/dL 45  41  52   Creatinine 0.44 - 1.00 mg/dL 2.00  1.72  2.16   Sodium 135 - 145  mmol/L 137  140  138   Potassium 3.5 - 5.1 mmol/L 3.7  3.8  3.4   Chloride 98 - 111 mmol/L 101  99  102   CO2 22 - 32 mmol/L '29  29  29   '$ Calcium 8.9 - 10.3 mg/dL 8.2  8.4  8.4      Assessment/Plan:   Pt is a 76 y.o. yo female with history of HTN, HLD, hypothyroidism, PE, presented with SBO underwent emergent ex lap with lysis of adhesion on 8/17, developed anterior dependent respiratory failure, AKI requiring CRRT.   #Acute kidney injury, oligoanuric, dialysis dependent: Likely ischemic ATN from shock/sepsis.  She was on ARB prior to admission.  Initially required CRRT which was stopped on 8/21 and initiated IHD on 8/23.  Seen by palliative care team.   - I have re-ordered strict ins/outs  - hold HD today and assess labs daily - If no renal recovery in next 1-2 days then she will need tunneled HD catheter and outpatient HD for AKI arrangement.   - would choose an alternative to buspirone given her AKI though this does appear to be improving   #Septic shock, off  of pressors.  Improved blood pressure    #High-grade small bowel obstruction now status post ex lap and lysis of adhesion.  On TPN.  per primary and general surgery   #Hyponatremia, hypervolemic: resolved   #Anemia of critical illness: Received a unit of blood transfusion on 8/29 and on 9/2.  iron deficiency - s/p repletion Transfuse as needed.  Dispo - Continue inpatient monitoring   Claudia Desanctis, MD 01/28/2022 12:29 PM

## 2022-01-28 NOTE — Progress Notes (Signed)
PROGRESS NOTE  [name] OBS:[id number] DOB: 1945-12-26   PCP: Chevis Pretty, FNP  Patient is from: Home  DOA: 01/07/2022 LOS: 5  Chief complaints Chief Complaint  Patient presents with   Abdominal Pain     Brief Narrative / Interim history: 76 year old F with PMH of PE, HTN, HLD, hypothyroidism, osteoarthritis and depression returned to [facility] ED with nausea, vomiting and diarrhea and found to small bowel obstruction.  She then developed AKI and septic shock and underwent emergent ex lap with lysis of adhesion on 8/17.  Postoperatively, she remained intubated, started on CRRT and transferred to [facility] on 8/19.  Eventually extubated on 8/21, and started on TNA.  She is started intermittent HD on 8/23.   Hospital course complicated by A-fib with RVR and diarrhea.  Cardiology consulted and she was started on IV amiodarone for A-fib with RVR.  In regards to diarrhea, C. difficile negative.  GIP positive for STEC.  Diarrhea improving.  Patient had repeat CT abdomen as part of evaluation for worsening abdominal pain that was negative for abscess but moderate bilateral pleural effusion.  She underwent right thoracocentesis with removal of 1.1 L on 8/25.  General surgery and nephrology following.  SLP recommended dysphagia 3 diet after FEES on 8/30.  TPN at half rate.  However, p.o. intake remains poor.  Plan for cortrack and TF on 9/5.  Disposition likely SNF once cleared by nephrology and general surgery.  Dialysis as needed.  Subjective: Seen and examined earlier this morning.  No major events overnight of this morning.  No complaints.  Poor appetite.  Also some nausea.  Denies pain or shortness of breath.  Objective: Vitals:   01/27/22 2127 01/27/22 2344 01/28/22 0422 01/28/22 0820  BP: 121/80 129/76 139/85   Pulse: 65 77 77 77  Resp: '16 18 19   '$ Temp: 98.4 F (36.9 C) 98.3 F (36.8 C) 98.3 F (36.8 C) 98.1 F (36.7 C)  TempSrc: Oral Oral Oral Oral  SpO2: 97%  97% 95%   Weight:   106.8 kg   Height:        Examination:  GENERAL: No apparent distress.  Nontoxic. HEENT: MMM.  Vision and hearing grossly intact.  NECK: Supple.  No apparent JVD.  RESP:  No IWOB.  Fair aeration bilaterally. CVS:  RRR. Heart sounds normal.  ABD/GI/GU: BS+. Abd soft, NTND.  Laparotomy wound healing. MSK/EXT:  Moves extremities. No apparent deformity.  Trace BLE edema.  Ace wrap over LUE. SKIN: no apparent skin lesion or wound NEURO: Sleepy but wakes to voice.  Oriented appropriately.  No apparent focal neuro deficit. PSYCH: Calm. Normal affect.   Procedures:  8/17-laparotomy with lysis of adhesion   Microbiology summarized: 8/14-urine culture with multiple species 8/17-MRSA PCR screen negative 8/17-blood cultures NGTD 8/19-MRSA PCR screen negative 8/26-C. difficile PCR negative 8/25-GIP with STEC   Assessment and plan: Principal Problem:   Septic shock ([place]) Active Problems:   Respiratory failure requiring intubation (HCC)   Small bowel obstruction (HCC)   COPD (chronic obstructive pulmonary disease) ([place])   Hyperlipidemia   Hypothyroidism (acquired)   Essential hypertension   History of pulmonary embolus (PE)   AKI (acute kidney injury) ([place])   Acute respiratory failure with hypoxia and hypercapnia (HCC)   Pressure injury of skin   Malnutrition of moderate degree  Septic shock in the setting of mall bowel obstruction and intra-abdominal infection/STEC: Resolved. -Completed antibiotic course with aztreonam and metronidazole on 8/27 -Repeat CT on 8/24 negative for  abscess and decreased bowel distention  SBO status post laparotomy 8/17 -Dysphagia 3 diet on 8/30>>  -Now on 1/2 rate TPN and nepro shakes -P.o. intake remains poor. Cortrak feeding starting 9/5   Acute Metabolic Encephalopathy: In the setting of septic shock and SBO: Resolved.  Oriented x4. -Reorientation and delirium precaution   Paroxysmal A-fib with RVR/PVCs: Currently rate  controlled. -Continue metoprolol and amiodarone -On IV heparin for anticoagulation.  Transition to p.o. agent prior to discharge -Cardiology signed off.   Acute hypoxic respiratory failure/pulmonary edema/bilateral pleural effusion: Improving. -S/p right thoracocentesis on 8/25 by PCCM -Fluid management by HD per nephrology -Asked RN to wean off oxygen.  Encourage incentive telemetry  AKI/azotemia: Likely from ATN in the setting of sepsis.  Intake and output difficult to monitor due to incontinence Recent Labs    01/20/22 0343 01/21/22 0645 01/22/22 0814 01/23/22 5284 01/24/22 0500 01/24/22 1504 01/25/22 0548 01/26/22 0306 01/27/22 0536 01/28/22 0426  BUN 60* 77* 59* 69* 78* 80* 47* 52* 41* 45*  CREATININE 2.43* 2.73* 2.14* 2.48* 2.74* 2.71* 1.94* 2.16* 1.72* 2.00*  -CRRT discontinued on 8/21.  IHD started on 8/23. -Nephrology following-recommends IHD as needed -Monitor intake and output -Avoid nephrotoxic meds   Infectious diarrhea/gastroenteritis due to STEC: Diarrhea resolved.   Iron deficiency anemia superimposed on anemia of chronic disease: Transfused on 8/21 and 9/2. Recent Labs    01/20/22 0343 01/21/22 0645 01/22/22 0429 01/23/22 0637 01/24/22 0500 01/24/22 1504 01/25/22 0548 01/26/22 0306 01/27/22 0536 01/28/22 0426  HGB 7.2* 7.3* 7.1* 7.4* 7.6* 7.6* 7.2* 7.2* 8.5* 8.6*  -Continue monitoring   Hyponatremia, hypokalemia, hypophosphatemia: Resolved. -Monitor and correct as appropriate   Leukocytosis: Likely demargination.  Resolved.  Morbid obesity Body mass index is 40.42 kg/m.  Moderate malnutrition Nutrition Problem: Moderate Malnutrition Etiology: acute illness (SBO) Signs/Symptoms: energy intake < 75% for > 7 days, mild muscle depletion, moderate fat depletion Interventions: MVI, TPN, Nepro shake  Pressure skin injury: POA Pressure Injury 01/10/22 Coccyx Medial;Mid Stage 2 -  Partial thickness loss of dermis presenting as a shallow open  injury with a red, pink wound bed without slough. pink (Active)  01/10/22 1200  Location: Coccyx  Location Orientation: Medial;Mid  Staging: Stage 2 -  Partial thickness loss of dermis presenting as a shallow open injury with a red, pink wound bed without slough.  Wound Description (Comments): pink  Present on Admission:   Dressing Type Foam - Lift dressing to assess site every shift 01/27/22 1945     Pressure Injury 01/12/22 Face Left Stage 2 -  Partial thickness loss of dermis presenting as a shallow open injury with a red, pink wound bed without slough. red ulceration beneath ETT holder on L cheek (Active)  01/12/22 1815  Location: Face  Location Orientation: Left  Staging: Stage 2 -  Partial thickness loss of dermis presenting as a shallow open injury with a red, pink wound bed without slough.  Wound Description (Comments): red ulceration beneath ETT holder on L cheek  Present on Admission: Yes  Dressing Type Foam - Lift dressing to assess site every shift 01/26/22 0845     Pressure Injury 01/16/22 Lumbar Left (Active)  01/16/22 1230  Location: Lumbar  Location Orientation: Left  Staging:   Wound Description (Comments):   Present on Admission: Yes  Dressing Type Foam - Lift dressing to assess site every shift 01/26/22 0845     Pressure Injury 01/16/22 Buttocks Left (Active)  01/16/22 1230  Location: Buttocks  Location Orientation: Left  Staging:   Wound Description (Comments):   Present on Admission: Yes  Dressing Type Foam - Lift dressing to assess site every shift 01/27/22 0830   DVT prophylaxis:  Place and maintain sequential compression device Start: 01/20/22 0901  Code Status: DNR/DNI Family Communication: None at bedside Level of care: Telemetry Medical Status is: Inpatient Remains inpatient appropriate because: Acute kidney failure requiring dialysis   Final disposition: Likely SNF once medically cleared. Consultants:   Pulmonology Nephrology Cardiology  Sch Meds:  Scheduled Meds:  sodium chloride   Intravenous Once   amiodarone  200 mg Oral Daily   busPIRone  5 mg Oral BID   Chlorhexidine Gluconate Cloth  6 each Topical Q0600   escitalopram  10 mg Oral Daily   feeding supplement (NEPRO CARB STEADY)  237 mL Oral BID BM   ferrous sulfate  325 mg Oral Q breakfast   folic acid  1 mg Oral Daily   guaiFENesin  600 mg Oral BID   metoprolol tartrate  12.5 mg Oral BID   multivitamin with minerals  1 tablet Oral Daily   sodium chloride flush  10-40 mL Intracatheter Q12H   Continuous Infusions:  sodium chloride Stopped (01/15/22 1255)   anticoagulant sodium citrate     heparin 1,650 Units/hr (93/81/01 7510)   TPN CYCLIC-ADULT (ION)     PRN Meds:.acetaminophen, albuterol, alteplase, alteplase, anticoagulant sodium citrate, guaiFENesin-dextromethorphan, heparin, lidocaine (PF), lidocaine-prilocaine, lip balm, [DISCONTINUED] ondansetron **OR** ondansetron (ZOFRAN) IV, mouth rinse, oxyCODONE, pentafluoroprop-tetrafluoroeth, sodium chloride flush  Antimicrobials: Anti-infectives (From admission, onward)    Start     Dose/Rate Route Frequency Ordered Stop   01/16/22 2000  aztreonam (AZACTAM) 1 g in sodium chloride 0.9 % 100 mL IVPB  Status:  Discontinued        1 g 200 mL/hr over 30 Minutes Intravenous Every 8 hours 01/16/22 1840 01/20/22 1254   01/16/22 2000  metroNIDAZOLE (FLAGYL) IVPB 500 mg  Status:  Discontinued        500 mg 100 mL/hr over 60 Minutes Intravenous Every 8 hours 01/16/22 1840 01/20/22 1254   01/15/22 0526  aztreonam (AZACTAM) 1 g in sodium chloride 0.9 % 100 mL IVPB  Status:  Discontinued       See Hyperspace for full Linked Orders Report.   1 g 200 mL/hr over 30 Minutes Intravenous Every 24 hours 01/14/22 1529 01/14/22 1534   01/15/22 0526  aztreonam (AZACTAM) 1 g in sodium chloride 0.9 % 100 mL IVPB       See Hyperspace for full Linked Orders Report.   1 g 200 mL/hr over 30  Minutes Intravenous Every 24 hours 01/14/22 1534 01/15/22 0504   01/13/22 2100  vancomycin (VANCOCIN) IVPB 1000 mg/200 mL premix  Status:  Discontinued        1,000 mg 200 mL/hr over 60 Minutes Intravenous Every 24 hours 01/12/22 2135 01/13/22 1027   01/13/22 0500  aztreonam (AZACTAM) 2 g in sodium chloride 0.9 % 100 mL IVPB  Status:  Discontinued       See Hyperspace for full Linked Orders Report.   2 g 200 mL/hr over 30 Minutes Intravenous Every 12 hours 01/12/22 2135 01/14/22 1529   01/12/22 2015  vancomycin (VANCOREADY) IVPB 1750 mg/350 mL        1,750 mg 175 mL/hr over 120 Minutes Intravenous  Once 01/12/22 1921 01/12/22 2259   01/11/22 1800  aztreonam (AZACTAM) 1 g in sodium chloride 0.9 % 100 mL IVPB  Status:  Discontinued  See Hyperspace for full Linked Orders Report.   1 g 200 mL/hr over 30 Minutes Intravenous Every 8 hours 01/11/22 0815 01/12/22 2135   01/11/22 1000  levofloxacin (LEVAQUIN) IVPB 500 mg  Status:  Discontinued        500 mg 100 mL/hr over 60 Minutes Intravenous Every 48 hours 01/10/22 1432 01/11/22 0744   01/11/22 0915  aztreonam (AZACTAM) 2 g in sodium chloride 0.9 % 100 mL IVPB       See Hyperspace for full Linked Orders Report.   2 g 200 mL/hr over 30 Minutes Intravenous  Once 01/11/22 0815 01/11/22 1045   01/11/22 0845  aztreonam (AZACTAM) 2 g in sodium chloride 0.9 % 100 mL IVPB  Status:  Discontinued        2 g 200 mL/hr over 30 Minutes Intravenous  Once 01/11/22 0747 01/11/22 0815   01/11/22 0845  metroNIDAZOLE (FLAGYL) IVPB 500 mg  Status:  Discontinued        500 mg 100 mL/hr over 60 Minutes Intravenous Every 12 hours 01/11/22 0747 01/15/22 0851   01/10/22 1900  levofloxacin (LEVAQUIN) IVPB 500 mg  Status:  Discontinued        500 mg 100 mL/hr over 60 Minutes Intravenous Every 24 hours 01/10/22 1426 01/10/22 1432   01/10/22 0700  ciprofloxacin (CIPRO) IVPB 400 mg        400 mg 200 mL/hr over 60 Minutes Intravenous On call to O.R. 01/10/22 0612  01/10/22 1052   01/10/22 0700  metroNIDAZOLE (FLAGYL) IVPB 500 mg  Status:  Discontinued        500 mg 100 mL/hr over 60 Minutes Intravenous On call to O.R. 01/10/22 0612 01/10/22 1123   01/08/22 0830  Levofloxacin (LEVAQUIN) IVPB 250 mg  Status:  Discontinued        250 mg 50 mL/hr over 60 Minutes Intravenous Every 24 hours 01/08/22 0724 01/08/22 0727   01/08/22 0830  Levofloxacin (LEVAQUIN) IVPB 250 mg  Status:  Discontinued        250 mg 50 mL/hr over 60 Minutes Intravenous Every 48 hours 01/08/22 0727 01/08/22 0732   01/08/22 0830  Levofloxacin (LEVAQUIN) IVPB 250 mg  Status:  Discontinued        250 mg 50 mL/hr over 60 Minutes Intravenous Every 24 hours 01/08/22 0732 01/10/22 1426        I have personally reviewed the following labs and images: CBC: Recent Labs  Lab 01/24/22 0500 01/24/22 1504 01/25/22 0548 01/26/22 0306 01/27/22 0536 01/28/22 0426  WBC 11.2* 10.9* 9.1 8.4 7.6  --   HGB 7.6* 7.6* 7.2* 7.2* 8.5* 8.6*  HCT 23.8* 24.7* 22.5* 22.7* 27.4* 27.3*  MCV 89.1 90.5 89.6 90.4 89.5  --   PLT 532* 560* 541* 526* 519*  --    BMP &GFR Recent Labs  Lab 01/23/22 0637 01/24/22 0500 01/24/22 1504 01/25/22 0548 01/26/22 0306 01/27/22 0536 01/28/22 0426  NA 134* 135 136 136 138 140 137  K 3.7 3.8 3.9 3.6 3.4* 3.8 3.7  CL 99 100 100 99 102 99 101  CO2 '26 27 28 30 29 29 29  '$ GLUCOSE 119* 105* 95 105* 112* 113* 116*  BUN 69* 78* 80* 47* 52* 41* 45*  CREATININE 2.48* 2.74* 2.71* 1.94* 2.16* 1.72* 2.00*  CALCIUM 7.9* 8.2* 8.3* 8.0* 8.4* 8.4* 8.2*  MG 2.1 2.3  --  2.0 2.2 1.9 2.1  PHOS 3.6 4.3 4.4  --   --  2.9 3.2   Estimated Creatinine  Clearance: 29 mL/min (A) (by C-G formula based on SCr of 2 mg/dL (H)). Liver & Pancreas: Recent Labs  Lab 01/23/22 0637 01/24/22 0500 01/24/22 1504 01/27/22 0536 01/28/22 0426  AST  --  15  --   --  16  ALT  --  15  --   --  17  ALKPHOS  --  88  --   --  113  BILITOT  --  0.6  --   --  0.6  PROT  --  5.1*  --   --  5.5*   ALBUMIN 1.7* 1.8* 1.9* 1.8* 1.9*   No results for input(s): "LIPASE", "AMYLASE" in the last 168 hours. No results for input(s): "AMMONIA" in the last 168 hours. Diabetic: No results for input(s): "HGBA1C" in the last 72 hours. Recent Labs  Lab 01/27/22 0656 01/27/22 1325 01/27/22 1941 01/27/22 2342 01/28/22 1116  GLUCAP 112* 107* 125* 118* 117*   Cardiac Enzymes: No results for input(s): "CKTOTAL", "CKMB", "CKMBINDEX", "TROPONINI" in the last 168 hours. No results for input(s): "PROBNP" in the last 8760 hours. Coagulation Profile: No results for input(s): "INR", "PROTIME" in the last 168 hours. Thyroid Function Tests: No results for input(s): "TSH", "T4TOTAL", "FREET4", "T3FREE", "THYROIDAB" in the last 72 hours. Lipid Profile: Recent Labs    01/28/22 0426  TRIG 45    Anemia Panel: No results for input(s): "VITAMINB12", "FOLATE", "FERRITIN", "TIBC", "IRON", "RETICCTPCT" in the last 72 hours.  Urine analysis:    Component Value Date/Time   COLORURINE YELLOW 01/11/2022 1051   APPEARANCEUR CLOUDY (A) 01/11/2022 1051   APPEARANCEUR Clear 03/01/2021 1257   LABSPEC 1.010 01/11/2022 1051   PHURINE 5.0 01/11/2022 1051   GLUCOSEU NEGATIVE 01/11/2022 1051   HGBUR SMALL (A) 01/11/2022 1051   BILIRUBINUR NEGATIVE 01/11/2022 1051   BILIRUBINUR Negative 03/01/2021 [place] 01/11/2022 1051   PROTEINUR NEGATIVE 01/11/2022 1051   NITRITE NEGATIVE 01/11/2022 1051   LEUKOCYTESUR LARGE (A) 01/11/2022 1051   Sepsis Labs: Invalid input(s): "PROCALCITONIN", "LACTICIDVEN"  Microbiology: Recent Results (from the past 240 hour(s))  Gastrointestinal Panel by PCR , Stool     Status: Abnormal   Collection Time: 01/18/22  3:42 PM   Specimen: Stool  Result Value Ref Range Status   Campylobacter species NOT DETECTED NOT DETECTED Final   Plesimonas shigelloides NOT DETECTED NOT DETECTED Final   Salmonella species NOT DETECTED NOT DETECTED Final   Yersinia enterocolitica  NOT DETECTED NOT DETECTED Final   Vibrio species NOT DETECTED NOT DETECTED Final   Vibrio cholerae NOT DETECTED NOT DETECTED Final   Enteroaggregative E coli (EAEC) NOT DETECTED NOT DETECTED Final   Enterotoxigenic E coli (ETEC) NOT DETECTED NOT DETECTED Final   Shiga like toxin producing E coli (STEC) DETECTED (A) NOT DETECTED Final    Comment: RESULT CALLED TO, READ BACK BY AND VERIFIED WITH: NAA DORMON 01/20/22 1417 AMK    E. coli O157 NOT DETECTED NOT DETECTED Final   Shigella/Enteroinvasive E coli (EIEC) NOT DETECTED NOT DETECTED Final   Cryptosporidium NOT DETECTED NOT DETECTED Final   Cyclospora cayetanensis NOT DETECTED NOT DETECTED Final   Entamoeba histolytica NOT DETECTED NOT DETECTED Final   Giardia lamblia NOT DETECTED NOT DETECTED Final   Adenovirus F40/41 NOT DETECTED NOT DETECTED Final   Astrovirus NOT DETECTED NOT DETECTED Final   Norovirus GI/GII NOT DETECTED NOT DETECTED Final   Rotavirus A NOT DETECTED NOT DETECTED Final   Sapovirus (I, II, IV, and V) NOT DETECTED NOT DETECTED Final  Comment: Performed at [facility], [place], [place] [address]  C Difficile Quick Screen (NO PCR Reflex)     Status: None   Collection Time: 01/19/22  6:32 PM   Specimen: STOOL  Result Value Ref Range Status   C Diff antigen NEGATIVE NEGATIVE Final   C Diff toxin NEGATIVE NEGATIVE Final   C Diff interpretation No C. difficile detected.  Final    Comment: Performed at [facility], [place] [address], [place], [place] [address]    Radiology Studies: No results found.    Mariusz Jubb T. [place]  If 7PM-7AM, please contact night-coverage [email or web address] 01/28/2022, 1:14 PM

## 2022-01-28 NOTE — Progress Notes (Signed)
ANTICOAGULATION CONSULT NOTE - Follow Up Consult  Pharmacy Consult for heparin Indication: atrial fibrillation  Labs: Recent Labs    01/26/22 0306 01/27/22 0536 01/28/22 0426  HGB 7.2* 8.5* 8.6*  HCT 22.7* 27.4* 27.3*  PLT 526* 519*  --   HEPARINUNFRC 0.57 0.30 0.31  CREATININE 2.16* 1.72* 2.00*    Assessment 76yo female therapeutic on heparin with level at 0.31. Hgb trending up and platelets elevated. No signs/symptoms of bleeding and no issues with the line per nursing.  Plan Continue infusion at 1650 units/hr  Monitor heparin level and CBC daily Monitor for signs/symptoms of bleeding  Sandford Craze, PharmD. [facility] Acute Care PGY-1 01/28/2022 7:08 AM

## 2022-01-28 NOTE — Progress Notes (Addendum)
PHARMACY - TOTAL PARENTERAL NUTRITION CONSULT NOTE   Indication: Prolonged ileus  Patient Measurements: Height: '5\' 4"'$  (162.6 cm) Weight: 106.8 kg (235 lb 7.2 oz) IBW/kg (Calculated) : 54.7 TPN AdjBW (KG): 67.8 Body mass index is 40.42 kg/m.  Assessment: 76 yo female with septic shock, high-grade small bowel obstruction. Underwent surgery on 8/17 with ex lap and lysis of adhesions for SBO. Reported vomiting and diarrhea since 8/11 up until surgery on 8/17. Pharmacy consulted for TPN due to prolonged ileus.  9/2: Encouraging po intake as much as possible - pt taking some of Nepro shakes, cream of wheat, jello, [place] ices. 9/3: Po intake remains poor - cream of wheat, 1/2 Nephro, few bites of peach cobbler 9/4: Pt still reports not feeling hungry and losing appetite - she says she is trying. Eating 10-15% of meals, same as yesterday basically.  Glucose / Insulin: no hx DM - CBGs < 140, off SSI Electrolytes: K 3.7, CoCa 9.9. Renal: 8/19-8/21 CRRT >> 8/23 HD (last 9/2), BUN 45 Hepatic: AST / ALT / tbili /TG wnl, albumin 1.9 Intake / Output; MIVF: UOP not charted. LBM 9/3 GI Imaging:  8/16 KUB: high grade bowel obstruction  8/24 CT abd: decr gas distention, anasarca throughout abd wall, distention of lower abdominal/pelvic small bowel loops  8/24 KUB: persistent SBO vs. post-op ileus, suspected ascites   GI Surgeries / Procedures:  8/17 with ex lap and LOA for SBO  Central access: triple lumen CVC placed 01/10/22 TPN start date: 01/14/22  Nutritional Goals:  RD Estimated Needs Total Energy Estimated Needs: 1700-1900 Total Protein Estimated Needs: 100-120 gm Total Fluid Estimated Needs: 1L + UOP  Current Nutrition:  TPN 8/30 DYS 3    Plan:  Continue cycle concentrated TPN at half goal (rate 23-46 ml/hr, GIR 0.58-1.16 mg/kg/min) which provides 59g AA and 960 Kcal, meeting 50% estimated needs with goal to wean. Electrolytes in TPN: Na 150 mEq/L (Max), K 30 mEq/L. Ca 5 mEq/L, Mg 7  mEq/L, Phos 2 mmol/L, UL:AGTX 1:1.  Continue MVI and trace elements enterally given full dysphagia diet Monitor TPN labs on Mon/Thurs F/u PO intake/diet advancement and ability to d/c TPN. Will f/u plan with [facility] as po intake doesn't appear to be improving much - RD consulted by [facility] for assessment. RD recommends Cortrak placement and enteral feeds.  Sherlon Handing, PharmD, BCPS Please see amion for complete clinical pharmacist phone list 01/28/2022 9:35 AM

## 2022-01-28 NOTE — Progress Notes (Signed)
Nutrition Follow-up  DOCUMENTATION CODES:   Non-severe (moderate) malnutrition in context of acute illness/injury  INTERVENTION:   TPN management per pharmacy Continue Nepro Shake po BID, each supplement provides 425 kcal and 19 grams protein Encourage good PO intake Continue Multivitamin w/ minerals daily Calorie Count per MD Once Cortrak is placed, initiate tube feeds. Start Osmolite 1.5 at 25 mL/hr and advance by 10 mL every 4 hour to goal rate of 45 mL/hr. (1080 mL/day) 60 mL ProSource TF20 - BID 150 mL free water flush q4h  Provides 1780 kcal, 108 gm of protein, and 1723 mL total free water daily  NUTRITION DIAGNOSIS:   Moderate Malnutrition related to acute illness (SBO) as evidenced by energy intake < 75% for > 7 days, mild muscle depletion, moderate fat depletion. - Ongoing   GOAL:   Patient will meet greater than or equal to 90% of their needs - Ongoing  MONITOR:   PO intake, Supplement acceptance, I & O's, Labs, Weight trends  REASON FOR ASSESSMENT:   Consult Calorie Count  ASSESSMENT:   76 yo female with septic shock, high-grade small bowel obstruction. 8/17- status post exploratory laparotomy with lysis of adhesions. Acute kidney injury. Per Nephrology-pt anuric and is scheduled for transfer to [facility] for CRRT. 8/17-Right IJ.  8/17: s/p ex-lap, lysis of adhesion 8/21: CRRT d/c; TPN started 8/23: first iHD  8/25: diet advanced to clear liquid; s/p thoracentesis-yield 1.1L 8/28: diet advanced to dysphagia 1 8/30 - diet advanced to Dysphagia 3  Met with pt. Pt reports that she has not ate anything today besides her medicine in pudding. Reports that she is still not eating much throughout the day and that yesterday she also only ate pudding with her medications. Pt reports that she is still only taking a few sips of the supplements each time. RD again discussed the importance of proper nutrition and that the next steps would be to transition to enteral nutrition.  RD discussed the process and pt agreeable to Cortrak placement. RD reached out and discussed with MD; MD agreeable. Plan for Cortrak placement tomorrow.   Meal completions: 10-15% x 2 meals  Medications reviewed and include: Ferrous Sulfate, Folic acid, MVI Labs reviewed: BUN 45, Creatinine 2.00, 24 hr CBG 107-125  HD on 9/2 Net UF: 2.8 L  Diet Order:   Diet Order             DIET DYS 3 Room service appropriate? Yes; Fluid consistency: Thin  Diet effective now                   EDUCATION NEEDS:   Not appropriate for education at this time  Skin:  Skin Assessment: Skin Integrity Issues: Skin Integrity Issues:: Other (Comment) Stage II: coccyx, L side of face Other: PI lumbar, L buttock  Last BM:  01/27/22  Height:   Ht Readings from Last 1 Encounters:  01/25/22 $RemoveB'5\' 4"'qaMIaBFi$  (1.626 m)    Weight:   Wt Readings from Last 1 Encounters:  01/28/22 106.8 kg    Ideal Body Weight:  54.5 kg  BMI:  Body mass index is 40.42 kg/m.  Estimated Nutritional Needs:   Kcal:  1700-1900  Protein:  100-120 gm  Fluid:  1L + UOP   Hermina Barters RD, LDN Clinical Dietitian See [facility] for contact information.

## 2022-01-29 ENCOUNTER — Inpatient Hospital Stay (HOSPITAL_COMMUNITY): Payer: Medicare Other

## 2022-01-29 ENCOUNTER — Encounter: Payer: Medicare Other | Admitting: General Surgery

## 2022-01-29 DIAGNOSIS — A419 Sepsis, unspecified organism: Secondary | ICD-10-CM | POA: Diagnosis not present

## 2022-01-29 DIAGNOSIS — Z515 Encounter for palliative care: Secondary | ICD-10-CM | POA: Diagnosis not present

## 2022-01-29 DIAGNOSIS — J9690 Respiratory failure, unspecified, unspecified whether with hypoxia or hypercapnia: Secondary | ICD-10-CM | POA: Diagnosis not present

## 2022-01-29 DIAGNOSIS — K56609 Unspecified intestinal obstruction, unspecified as to partial versus complete obstruction: Secondary | ICD-10-CM | POA: Diagnosis not present

## 2022-01-29 DIAGNOSIS — J449 Chronic obstructive pulmonary disease, unspecified: Secondary | ICD-10-CM | POA: Diagnosis not present

## 2022-01-29 LAB — RENAL FUNCTION PANEL
Albumin: 1.9 g/dL — ABNORMAL LOW (ref 3.5–5.0)
Anion gap: 10 (ref 5–15)
BUN: 51 mg/dL — ABNORMAL HIGH (ref 8–23)
CO2: 29 mmol/L (ref 22–32)
Calcium: 8.5 mg/dL — ABNORMAL LOW (ref 8.9–10.3)
Chloride: 101 mmol/L (ref 98–111)
Creatinine, Ser: 2.2 mg/dL — ABNORMAL HIGH (ref 0.44–1.00)
GFR, Estimated: 23 mL/min — ABNORMAL LOW (ref >60)
Glucose, Bld: 104 mg/dL — ABNORMAL HIGH (ref 70–99)
Phosphorus: 3.8 mg/dL (ref 2.5–4.6)
Potassium: 4.2 mmol/L (ref 3.5–5.1)
Sodium: 140 mmol/L (ref 135–145)

## 2022-01-29 LAB — GLUCOSE, CAPILLARY
Glucose-Capillary: 75 mg/dL (ref 70–99)
Glucose-Capillary: 119 mg/dL — ABNORMAL HIGH (ref 70–99)
Glucose-Capillary: 103 mg/dL — ABNORMAL HIGH (ref 70–99)

## 2022-01-29 LAB — HEPARIN LEVEL (UNFRACTIONATED): Heparin Unfractionated: 0.42 IU/mL (ref 0.30–0.70)

## 2022-01-29 MED ORDER — IOHEXOL 300 MG/ML  SOLN
50.0000 mL | Freq: Once | INTRAMUSCULAR | Status: AC | PRN
  Administered 2022-01-29: 20 mL

## 2022-01-29 MED ORDER — STERILE WATER FOR INJECTION IV SOLN
INTRAVENOUS | Status: AC
  Filled 2022-01-29: qty 410.8

## 2022-01-29 MED ORDER — CHLORHEXIDINE GLUCONATE CLOTH 2 % EX PADS
6.0000 | MEDICATED_PAD | Freq: Every day | CUTANEOUS | Status: DC
  Administered 2022-01-30 – 2022-02-01 (×3): 6 via TOPICAL

## 2022-01-29 MED ORDER — DARBEPOETIN ALFA 60 MCG/0.3ML IJ SOSY
60.0000 ug | PREFILLED_SYRINGE | INTRAMUSCULAR | Status: DC
  Administered 2022-01-29: 60 ug via SUBCUTANEOUS
  Filled 2022-01-29: qty 0.3

## 2022-01-29 MED ORDER — FUROSEMIDE 10 MG/ML IJ SOLN
60.0000 mg | Freq: Once | INTRAMUSCULAR | Status: AC
  Administered 2022-01-29: 60 mg via INTRAVENOUS
  Filled 2022-01-29: qty 6

## 2022-01-29 MED ORDER — LIDOCAINE VISCOUS HCL 2 % MT SOLN
15.0000 mL | Freq: Once | OROMUCOSAL | Status: AC
  Administered 2022-01-29: 2 mL via OROMUCOSAL
  Filled 2022-01-29: qty 15

## 2022-01-29 NOTE — Progress Notes (Signed)
OT Cancellation Note  Patient Details Name: [name] MRN: [id number] DOB: 10-09-1945   Cancelled Treatment:    Reason Eval/Treat Not Completed: Patient at procedure or test/ unavailable (Radiology. WIll return as schedule allows.)  Ida, OTR/L Acute Rehab Office: [phone] 01/29/2022, 4:12 PM

## 2022-01-29 NOTE — Procedures (Signed)
Cortrak  Person Inserting Tube:  Alroy Dust, Santita Hunsberger L, RD Tube Type:  Cortrak - 43 inches Tube Size:  10 Tube Location:  Right nare Secured by: Bridle Technique Used to Measure Tube Placement:  Marking at nare/corner of mouth Cortrak Secured At:  44 cm   Cortrak Tube Team Note:  Consult received to place a Cortrak feeding tube. This RD attempted tube placement with assistance of another RD. Both RD's unable to advance tube into pt stomach passed the GE junction. Tube was bridled and left at 44 cm, x-ray ordered. RD rounding pt today reached out to fluoroscopy to advance tube into stomach.   X-ray is required, abdominal x-ray has been ordered by the Cortrak team. Please confirm tube placement before using the Cortrak tube.   If the tube becomes dislodged please keep the tube and contact the Cortrak team at [email or web address] ([email or web address]) for replacement.  If after hours and replacement cannot be delayed, place a NG tube and confirm placement with an abdominal x-ray.    Hermina Barters RD, LDN Clinical Dietitian See Shea Evans for contact information.

## 2022-01-29 NOTE — Progress Notes (Signed)
Speech Language Pathology Treatment: Dysphagia  Patient Details Name: [name] MRN: [id number] DOB: September 05, 1945 Today's Date: 01/29/2022 Time: 9357-0177 SLP Time Calculation (min) (ACUTE ONLY): 12 min  Assessment / Plan / Recommendation Clinical Impression  Pt had Cortrak placed this morning and reporting it as being "rough" on her. RN also reported a coughing episode, and son adds that pt expectorate mucus after. He says that this happened right after she was laid flat for her x-ray. Question if she may have had some regurgitation vs productive coughing in the setting of bed mobility. Either way, pt was not up for trying much in the form of POs other than some lemonade. Pt drank this via straw with no overt signs of aspiration and no overt oral dysphagia, which was noted during FEES more with solid foods (and suspected to be cognitively based). Recommend continuing on Dys 3 diet and thin liquids for now. If pt is able to demonstrate her ability to consume more solid POs during subsequent visits, she may be able to liberalize diet further to try to help facilitate intake.    HPI HPI: Patient is a 76 y.o. female with PMH: COPD, HLD, essential HTN, hypothyroidism, PE, osteoarthritis, depression, GERD. She presented initially to urgent care on 01/04/22 with acute onset abdominal pain, had CT which was unremarkable and she received IV fluids and antiemetics and was discharged hom. She presented to the hospital from home on 01/07/22 with vomiting and diarrhea since 01/04/22. In ED CT scan showed partial SBO. Surgery consulted and NG ordered. 8/15, had worsening renal function; 8/17 she went into shock and required emergent Exploratory laparotomy performed in OR and patient remained on vent on pressors post op. She arrived at [facility] on 8/19 and CRRT started; 8/21 she was extubated, TNA started and CRRT discontinued.      SLP Plan  Continue with current plan of care      Recommendations for follow up therapy  are one component of a multi-disciplinary discharge planning process, led by the attending physician.  Recommendations may be updated based on patient status, additional functional criteria and insurance authorization.    Recommendations  Diet recommendations: Dysphagia 3 (mechanical soft);Thin liquid Liquids provided via: Cup;Straw Medication Administration: Other (Comment) Supervision: Full supervision/cueing for compensatory strategies;Staff to assist with self feeding Compensations: Slow rate;Small sips/bites Postural Changes and/or Swallow Maneuvers: Seated upright 90 degrees                Oral Care Recommendations: Oral care BID Follow Up Recommendations: No SLP follow up Assistance recommended at discharge: Frequent or constant Supervision/Assistance SLP Visit Diagnosis: Dysphagia, oropharyngeal phase (R13.12) Plan: Continue with current plan of care           Osie Bond., M.A. [place] Office [phone]  Secure chat preferred   01/29/2022, 1:41 PM

## 2022-01-29 NOTE — Progress Notes (Signed)
[facility] for Heparin Indication: atrial fibrillation  Allergies  Allergen Reactions   Celebrex [Celecoxib] Swelling   Keflet [Cephalexin] Swelling   Penicillins Hives and Swelling    DID THE REACTION INVOLVE: Swelling of the face/tongue/throat, SOB, or low BP? Yes-swelling-hives Sudden or severe rash/hives, skin peeling, or the inside of the mouth or nose? Unknown Did it require medical treatment? Unknown When did it last happen?    over 10 years   If all above answers are "NO", may proceed with cephalosporin use.    Sulfa Antibiotics Swelling   Kenalog [Triamcinolone Acetonide]     unknown   Latex    Lisinopril Other (See Comments)    unknown   Mobic [Meloxicam]     SWELLING    Patient Measurements: Height: '5\' 4"'$  (162.6 cm) Weight: 106.3 kg (234 lb 5.6 oz) IBW/kg (Calculated) : 54.7  Heparin Dosing Weight: 72 kg  Vital Signs: Temp: 98 F (36.7 C) (09/05 0600) Temp Source: Oral (09/05 0600) BP: 132/74 (09/05 0600) Pulse Rate: 73 (09/05 0600)  Labs: Recent Labs    01/27/22 0536 01/28/22 0426 01/29/22 0607  HGB 8.5* 8.6*  --   HCT 27.4* 27.3*  --   PLT 519*  --   --   HEPARINUNFRC 0.30 0.31 0.42  CREATININE 1.72* 2.00* 2.20*    Estimated Creatinine Clearance: 26.3 mL/min (A) (by C-G formula based on SCr of 2.2 mg/dL (H)).   Assessment: Pharmacy consulted to dose heparin infusion for post-op afib. Patient was not on any AC PTA.  She is noted s/p ex lap and on TPN.  Plans are note for cortrack and to start tube feeds soon  Heparin level therapeutic at 0.42 on 1650 units/hr. Hgb 8.6  Goal of Therapy:  Heparin level 0.3-0.7 units/ml Monitor platelets by anticoagulation protocol: Yes   Plan:  Continue heparin infusion at 1650 units/hr Check daily heparin level and CBC  Hildred Laser, PharmD Clinical Pharmacist **Pharmacist phone directory can now be found on [email or web address] ([email or web address]).  Listed under [place].

## 2022-01-29 NOTE — Progress Notes (Signed)
PHARMACY - TOTAL PARENTERAL NUTRITION CONSULT NOTE   Indication: Prolonged ileus  Patient Measurements: Height: '5\' 4"'$  (162.6 cm) Weight: 106.3 kg (234 lb 5.6 oz) IBW/kg (Calculated) : 54.7 TPN AdjBW (KG): 67.8 Body mass index is 40.23 kg/m.  Assessment: 76 yo female with septic shock, high-grade small bowel obstruction. Underwent surgery on 8/17 with ex lap and lysis of adhesions for SBO. Reported vomiting and diarrhea since 8/11 up until surgery on 8/17. Pharmacy consulted for TPN due to prolonged ileus.  9/2: Encouraging po intake as much as possible - pt taking some of Nepro shakes, cream of wheat, jello, [place] ices. 9/3: Po intake remains poor - cream of wheat, 1/2 Nephro, few bites of peach cobbler 9/4: Pt still reports not feeling hungry and losing appetite - she says she is trying. Eating 10-15% of meals, same as yesterday basically. 9/5 planning to start TF   Glucose / Insulin: no hx DM - CBGs < 140, off SSI Electrolytes:  CoCa 10.2, others wnl Renal: 8/19-8/21 CRRT >> 8/23 HD (last 9/2, hold 9/5 per nephro), Scr 2.2, BUN 51 up Hepatic: AST / ALT / tbili /TG wnl, albumin 1.9 Intake / Output; MIVF: UOP 42m charted. LBM 9/3 GI Imaging:  8/16 KUB: high grade bowel obstruction  8/24 CT abd: decr gas distention, anasarca throughout abd wall, distention of lower abdominal/pelvic small bowel loops  8/24 KUB: persistent SBO vs. post-op ileus, suspected ascites   GI Surgeries / Procedures:  8/17 with ex lap and LOA for SBO  Central access: triple lumen CVC placed 01/10/22 TPN start date: 01/14/22  Nutritional Goals:  RD Estimated Needs Total Energy Estimated Needs: 1700-1900 Total Protein Estimated Needs: 100-120 gm Total Fluid Estimated Needs: 1L + UOP  Current Nutrition:  TPN 8/30 DYS 3    Plan:  Continue cycle concentrated TPN at half goal (rate 23-46 ml/hr, GIR 0.58-1.16 mg/kg/min) which provides 62 g AA and 972 Kcal, meeting ~50% estimated needs with goal to  wean. Electrolytes in TPN: Remove K 0 mEq/L, Ca 0 mEq/L, Phos 0 mmol/L; Continue Na 150 mEq/L (Max),  Mg 7 mEq/L, CZJ:QBHA1:1.  Continue MVI and trace elements enterally, none in TPN Monitor TPN labs on Mon/Thurs F/u PO intake/diet advancement, Cortrak placement and enteral feed tolerance F/u nephrology plans   LBenetta Spar PharmD, BCPS, BRml Health Providers Limited Partnership - Dba Rml ChicagoClinical Pharmacist  Please check AMION for all [place] numbers After 10:00 PM, call [place]ount [phone]

## 2022-01-29 NOTE — Progress Notes (Addendum)
PT Lymphedema Management Note  Patient seen and wraps removed from L UE with measurements taken as below.  Noted improvements in all areas and less than R UE.  Applied compression sleeve brought in by family to L UE.  Plan to follow up to ensure stability of L UE and to address R UE with sleeve when family brings it in.  Encouraged to continue compression pumps for both LE's.      L UE (in cm)   RUE (in cm) MCP's    20.1    21.5 Wrist    17.8    20.9 10 cm prox   20.8    29.0 10 cm prox   29.2    32.1 Elbow crease   29.5    33.0 10 cm prox   31.2    31.7    01/29/22 1800  PT Visit Information  Last PT Received On 01/29/22  Assistance Needed +2  History of Present Illness 76 yo female admitted 8/14 to [facility] with vomiting and diarrhea with partial SBO. 8/17 intubated and ex lap. 8/19 pt with worsening renal function, transfer to [facility] and CRRT started. 8/21 extubation with CRRT off. PMHx: COPD, HLD, HTN, PE, hypothyroidism, OA and depression  Precautions  Precautions Fall  Restrictions  Weight Bearing Restrictions No  Pain Assessment  Pain Assessment Faces  Faces Pain Scale 4  Pain Location head  Pain Descriptors / Indicators Headache;Discomfort  Pain Intervention(s) Monitored during session;Patient requesting pain meds-RN notified  Cognition  Arousal/Alertness Awake/alert;Lethargic  Behavior During Therapy WFL for tasks assessed/performed  Overall Cognitive Status Impaired/Different from baseline  Area of Impairment Memory;Attention;Following commands  Current Attention Level Selective  Memory Decreased short-term memory  Following Commands Follows one step commands consistently  Safety/Judgement Decreased awareness of deficits  Problem Solving Slow processing  PT - End of Session  Equipment Utilized During Treatment Oxygen  Activity Tolerance Patient tolerated treatment well  Patient left in bed;with call bell/phone within reach   PT - Assessment/Plan  PT Plan Current plan  remains appropriate  PT Visit Diagnosis Other abnormalities of gait and mobility (R26.89);Difficulty in walking, not elsewhere classified (R26.2);Muscle weakness (generalized) (M62.81)  PT Frequency (ACUTE ONLY) Min 3X/week  Follow Up Recommendations Skilled nursing-short term rehab (<3 hours/day)  Can patient physically be transported by private vehicle No  Assistance recommended at discharge Frequent or constant Supervision/Assistance  Patient can return home with the following Two people to help with walking and/or transfers;Direct supervision/assist for medications management;Two people to help with bathing/dressing/bathroom;Assist for transportation;Direct supervision/assist for financial management  PT equipment Other (comment);Hospital bed  AM-PAC PT "6 Clicks" Mobility Outcome Measure (Version 2)  Help needed turning from your back to your side while in a flat bed without using bedrails? 2  Help needed moving from lying on your back to sitting on the side of a flat bed without using bedrails? 1  Help needed moving to and from a bed to a chair (including a wheelchair)? 1  Help needed standing up from a chair using your arms (e.g., wheelchair or bedside chair)? 1  Help needed to walk in hospital room? 1  Help needed climbing 3-5 steps with a railing?  1  6 Click Score 7  Consider Recommendation of Discharge To: CIR/SNF/LTACH  Progressive Mobility  What is the highest level of mobility based on the progressive mobility assessment? Level 1 (Bedfast) - Unable to balance while sitting on edge of bed  PT Goal Progression  Progress towards PT goals  Progressing toward goals  PT Time Calculation  PT Start Time (ACUTE ONLY) 1405  PT Stop Time (ACUTE ONLY) 1436  PT Time Calculation (min) (ACUTE ONLY) 31 min  PT General Charges  $$ ACUTE PT VISIT 1 Visit  PT Treatments  $Therapeutic Activity 23-37 mins   Magda Kiel, PT Acute Rehabilitation Services Office:[phone] 01/29/2022

## 2022-01-29 NOTE — Progress Notes (Signed)
   Palliative Medicine Inpatient Follow Up Note HPI: [name]  is a 76 yo female with a PMHx: COPD, HLD, HTN, PE, hypothyroidism, OA and depression. Admitted 8/14 to [facility] with vomiting and diarrhea with partial SBO. 8/17 intubated and ex lap. 8/19 pt with worsening renal function, transfer to [facility] and CRRT started. 8/21 extubation with CRRT off.  Palliative care has been asked to get involved to have goals of care conversations in the setting of a prolonged and complicated hospitalization.  Today's Discussion 01/29/2022  *Please note that this is a verbal dictation therefore any spelling or grammatical errors are due to the "[place]" system interpretation.  Chart reviewed inclusive of vital signs, progress notes, laboratory results, and diagnostic images. PO's remain overall quite poor. Outside food is encouraged.   I met with [name] at bedside this morning. She is in good spirits. She shares that she is feeling very much improved and has been tolerating iHD well. She vocalizes that the swelling in her legs is improving with treatment of her lymphedema. She shares that her pain has gotten vastly better in her abdomen and is now only incremental. The Buspar has helped her mood significantly. We discussed that she is going to go to rehabilitation for strengthening.   Continue with goals of improvement.   No family at bedside during assessment.  Palliative support provided to the patient.  Objective Assessment: Vital Signs Vitals:   01/28/22 2311 01/29/22 0600  BP: 137/82 132/74  Pulse: 71 73  Resp:  18  Temp: 98.1 F (36.7 C) 98 F (36.7 C)  SpO2: 98% 96%    Intake/Output Summary (Last 24 hours) at 01/29/2022 7741 Last data filed at 01/29/2022 2878 Gross per 24 hour  Intake 1156.89 ml  Output 450 ml  Net 706.89 ml    Last Weight  Most recent update: 01/29/2022  6:02 AM    Weight  106.3 kg (234 lb 5.6 oz)            Gen: Elderly Caucasian female HEENT: moist mucous  membranes CV: Regular rate and rhythm  PULM:  On 2LPM [place], breathing is even and nonlabored ABD: soft/nontender  EXT: (+) LE edema  Neuro: Alert and oriented x2  SUMMARY OF RECOMMENDATIONS   DNAR/DNI    Watchful waiting --> For improvements  Generalized weakness --> PT/OT, Needs daily mobility. Has lymphedema machine  Delirium Precautions    Anxiety --> Continue standing Buspar $RemoveBeforeD'5mg'[id number]$  PO BID can titrate up to symptoms  FTT --> Dietary is involved   Patient's goals are for improvement   TOC - OP Palliative support on discharge   Ongoing palliative care support  Billing based on MDM: High ______________________________________________________________________________________ [place] Team Team Cell Phone: [phone] Please utilize secure chat with additional questions, if there is no response within 30 minutes please call the above phone number  Palliative Medicine Team providers are available by phone from 7am to 7pm daily and can be reached through the team cell phone.  Should this patient require assistance outside of these hours, please call the patient's attending physician.

## 2022-01-29 NOTE — Progress Notes (Signed)
Nutrition Follow-up  DOCUMENTATION CODES:   Non-severe (moderate) malnutrition in context of acute illness/injury  INTERVENTION:  Discontinue calorie count TPN management per pharmacy Continue Nepro Shake po BID, each supplement provides 425 kcal and 19 grams protein Continue Multivitamin w/ minerals daily Once Cortrak tube advanced and placement confirmed, initiate tube feeds. Start Osmolite 1.5 at 25 mL/hr and advance by 10 mL every 8 hour to goal rate of 45 mL/hr. (1080 mL/day) 60 mL ProSource TF20 - BID Provides 1780 kcal, 108 gm of protein, and 823 mL total free water daily  NUTRITION DIAGNOSIS:   Moderate Malnutrition related to acute illness (SBO) as evidenced by energy intake < 75% for > 7 days, mild muscle depletion, moderate fat depletion.  Ongoing  GOAL:   Patient will meet greater than or equal to 90% of their needs  Goal met via TPN, transitioning to tube feeding via Cortrak  MONITOR:   PO intake, Supplement acceptance, I & O's, Labs, Weight trends  REASON FOR ASSESSMENT:   Consult Calorie Count  ASSESSMENT:   76 yo female with septic shock, high-grade small bowel obstruction. 8/17- status post exploratory laparotomy with lysis of adhesions. Acute kidney injury. Per Nephrology-pt anuric and is scheduled for transfer to [facility] for CRRT. 8/17-Right IJ.  8/17: s/p ex-lap, lysis of adhesion 8/21: CRRT d/c; TPN started 8/23: first iHD  8/25: diet advanced to clear liquid; s/p thoracentesis-yield 1.1L 8/28: diet advanced to dysphagia 1 8/30: diet advanced to Dysphagia 3 9/74: Cortrak placed  Cortrak placed. Per xray, tip in GE junction, needs advancement. MD placed consult for tube advancement by fluoroscopy. Hold tube feeding until gastric placement confirmed.   Per Nephology, anticipate need for HD tomorrow. Also notes if renal function worsens tomorrow and require ongoing need for dialysis, she will need TDC placement.   Spoke with pt after Cortrak  placement. ST present during visit. Her son also at bedside. He expressed concerns over an episode of coughing as well as spitting up clear fluid after abdominal xray. This could likely be r/t being laid flat for xray. No further episodes. Reached out to RN to continue to monitor.   She continues with very limited intake. RN states that she had a couple sips of Nepro but not much. Her son states that when she eats or drinks she only has 2 bites/sips and c/o early satiety and a bloating feeling. Pt pointed to the center of her stomach. Soft, non-distended on exam.   Current weight: 106.3 kg  Medications: ferrous sulfate, folvite, lasix, MVI  Labs: BUN 51, Cr 2.20, GFR 23, CBG's 75-119 x24 hours  UOP: 466m x12 hours  I/O's: +20.361msince 8/22  Diet Order:   Diet Order             DIET DYS 3 Room service appropriate? Yes; Fluid consistency: Thin  Diet effective now                   EDUCATION NEEDS:   Not appropriate for education at this time  Skin:  Skin Assessment: Skin Integrity Issues: Skin Integrity Issues:: Other (Comment) Stage II: coccyx, L side of face Other: PI lumbar, L buttock  Last BM:  9/5 (type 6)  Height:   Ht Readings from Last 1 Encounters:  01/25/22 _0  (1.626 m)    Weight:   Wt Readings from Last 1 Encounters:  01/29/22 106.3 kg    Ideal Body Weight:  54.5 kg  BMI:  Body mass index is 40.23  kg/m.  Estimated Nutritional Needs:   Kcal:  1700-1900  Protein:  100-120 gm  Fluid:  1L + UOP  Clayborne Dana, RDN, LDN Clinical Nutrition

## 2022-01-29 NOTE — Progress Notes (Addendum)
11 Days Post-Op   Subjective/Chief Complaint: On D3 diet but still low appetite - states she feels bloated quickly with eating but no nausea/emesis/worsening abdominal pain. Having bowel function and she thinks diarrhea is improving. Pain is well controlled. She had a cortrak placed this morning  Objective: Vital signs in last 24 hours: Temp:  [97.8 F (36.6 C)-98.3 F (36.8 C)] 98 F (36.7 C) (09/05 0600) Pulse Rate:  [66-94] 73 (09/05 0600) Resp:  [18] 18 (09/05 0600) BP: (95-137)/(60-82) 132/74 (09/05 0600) SpO2:  [96 %-98 %] 96 % (09/05 0600) Weight:  [106.3 kg] 106.3 kg (09/05 0600) Last BM Date : 01/27/22  Intake/Output from previous day: 09/04 0701 - 09/05 0700 In: 1156.9 [P.O.:240; I.V.:916.9] Out: 450 [Urine:450] Intake/Output this shift: No intake/output data recorded.  PE: Lungs: normal effort on [place]\ Abd: soft, ND, incision c/d/I; overall minimally tender on exam  Extremities: BUE and BLE edema  Lab Results:  Recent Labs    01/27/22 0536 01/28/22 0426  WBC 7.6  --   HGB 8.5* 8.6*  HCT 27.4* 27.3*  PLT 519*  --     BMET Recent Labs    01/28/22 0426 01/29/22 0607  NA 137 140  K 3.7 4.2  CL 101 101  CO2 29 29  GLUCOSE 116* 104*  BUN 45* 51*  CREATININE 2.00* 2.20*  CALCIUM 8.2* 8.5*    PT/INR No results for input(s): "LABPROT", "INR" in the last 72 hours. ABG No results for input(s): "PHART", "HCO3" in the last 72 hours.  Invalid input(s): "PCO2", "PO2"   Studies/Results: No results found.  Anti-infectives: Anti-infectives (From admission, onward)    Start     Dose/Rate Route Frequency Ordered Stop   01/16/22 2000  aztreonam (AZACTAM) 1 g in sodium chloride 0.9 % 100 mL IVPB  Status:  Discontinued        1 g 200 mL/hr over 30 Minutes Intravenous Every 8 hours 01/16/22 1840 01/20/22 1254   01/16/22 2000  metroNIDAZOLE (FLAGYL) IVPB 500 mg  Status:  Discontinued        500 mg 100 mL/hr over 60 Minutes Intravenous Every 8 hours 01/16/22  1840 01/20/22 1254   01/15/22 0526  aztreonam (AZACTAM) 1 g in sodium chloride 0.9 % 100 mL IVPB  Status:  Discontinued       See Hyperspace for full Linked Orders Report.   1 g 200 mL/hr over 30 Minutes Intravenous Every 24 hours 01/14/22 1529 01/14/22 1534   01/15/22 0526  aztreonam (AZACTAM) 1 g in sodium chloride 0.9 % 100 mL IVPB       See Hyperspace for full Linked Orders Report.   1 g 200 mL/hr over 30 Minutes Intravenous Every 24 hours 01/14/22 1534 01/15/22 0504   01/13/22 2100  vancomycin (VANCOCIN) IVPB 1000 mg/200 mL premix  Status:  Discontinued        1,000 mg 200 mL/hr over 60 Minutes Intravenous Every 24 hours 01/12/22 2135 01/13/22 1027   01/13/22 0500  aztreonam (AZACTAM) 2 g in sodium chloride 0.9 % 100 mL IVPB  Status:  Discontinued       See Hyperspace for full Linked Orders Report.   2 g 200 mL/hr over 30 Minutes Intravenous Every 12 hours 01/12/22 2135 01/14/22 1529   01/12/22 2015  vancomycin (VANCOREADY) IVPB 1750 mg/350 mL        1,750 mg 175 mL/hr over 120 Minutes Intravenous  Once 01/12/22 1921 01/12/22 2259   01/11/22 1800  aztreonam (AZACTAM) 1 g  in sodium chloride 0.9 % 100 mL IVPB  Status:  Discontinued       See Hyperspace for full Linked Orders Report.   1 g 200 mL/hr over 30 Minutes Intravenous Every 8 hours 01/11/22 0815 01/12/22 2135   01/11/22 1000  levofloxacin (LEVAQUIN) IVPB 500 mg  Status:  Discontinued        500 mg 100 mL/hr over 60 Minutes Intravenous Every 48 hours 01/10/22 1432 01/11/22 0744   01/11/22 0915  aztreonam (AZACTAM) 2 g in sodium chloride 0.9 % 100 mL IVPB       See Hyperspace for full Linked Orders Report.   2 g 200 mL/hr over 30 Minutes Intravenous  Once 01/11/22 0815 01/11/22 1045   01/11/22 0845  aztreonam (AZACTAM) 2 g in sodium chloride 0.9 % 100 mL IVPB  Status:  Discontinued        2 g 200 mL/hr over 30 Minutes Intravenous  Once 01/11/22 0747 01/11/22 0815   01/11/22 0845  metroNIDAZOLE (FLAGYL) IVPB 500 mg  Status:   Discontinued        500 mg 100 mL/hr over 60 Minutes Intravenous Every 12 hours 01/11/22 0747 01/15/22 0851   01/10/22 1900  levofloxacin (LEVAQUIN) IVPB 500 mg  Status:  Discontinued        500 mg 100 mL/hr over 60 Minutes Intravenous Every 24 hours 01/10/22 1426 01/10/22 1432   01/10/22 0700  ciprofloxacin (CIPRO) IVPB 400 mg        400 mg 200 mL/hr over 60 Minutes Intravenous On call to O.R. 01/10/22 0612 01/10/22 1052   01/10/22 0700  metroNIDAZOLE (FLAGYL) IVPB 500 mg  Status:  Discontinued        500 mg 100 mL/hr over 60 Minutes Intravenous On call to O.R. 01/10/22 0612 01/10/22 1123   01/08/22 0830  Levofloxacin (LEVAQUIN) IVPB 250 mg  Status:  Discontinued        250 mg 50 mL/hr over 60 Minutes Intravenous Every 24 hours 01/08/22 0724 01/08/22 0727   01/08/22 0830  Levofloxacin (LEVAQUIN) IVPB 250 mg  Status:  Discontinued        250 mg 50 mL/hr over 60 Minutes Intravenous Every 48 hours 01/08/22 0727 01/08/22 0732   01/08/22 0830  Levofloxacin (LEVAQUIN) IVPB 250 mg  Status:  Discontinued        250 mg 50 mL/hr over 60 Minutes Intravenous Every 24 hours 01/08/22 0732 01/10/22 1426       Assessment/Plan: POD#19 - status post ex lap with lysis of adhesions - Dr. Constance Haw ([facility]) 8/17 - Afebrile WBC normal.  CT scan negative for intra-abdominal infection.   -diarrhea improving -c diff neg, + for STEC -on D3 diet. Had cortrak placed this am. She does not need to be meeting 100% of her goals prior to discharge, but she does need to be drinking more protein shakes or eating more. - no further surgical intervention indicated at this time. No further recommendation. We are available as needed.   FEN: D3 diet, 1/2 rate TPN, nepro shakes ID: aztreonam 8/18 >>8/22, 8/23 restarted and flagyl, no abx needed from surgery standpoint VTE: hep gtt Foley: purewick in place     Afib - IV hep, rate control meds per cards AKI - CRRT stopped 8/21, now with IHD ABL anemia - stable Volume  overload/pleural effusions - per medicine   Data reviewed - vitals x 24 hrs, nursing note x 24hrs, CCM note, renal note, hospitalist note, and cardiology note, labs, imaging, I/Os  LOS: 22 days    Winferd Humphrey PA-C 01/29/2022

## 2022-01-29 NOTE — Care Management (Signed)
01-29-22 1035 Previously Case Manager discussed LTAC with the son and he declined [facility]. Family discussed LTAC with the CSW on 01-28-22. Case Manager reached out to [employer] with [facility] to see if the patient still meets eligibility criteria for LTAC. Patient does meet criteria and the family is touring [facility]  today. Per Liaison, family is looking at different places and they will decide what is best for the patient. Case Manager will continue to follow for additional transition of care needs as the patient progresses.

## 2022-01-29 NOTE — Progress Notes (Addendum)
[facility] Progress Note  Name: [name] MRN: [id number] DOB: [birth year]   Subjective:  strict ins/outs are not available.  She had 450 ml uop documented over 9/4 with documentation started at least mid day shift.  I called her son on rounds on her phone to update him.  No emergent need for dialysis today but I do anticipate needing to do HD tomorrow.  Wet sounding cough.  Spoke with the patient and her son about the need for a tunneled catheter should she continue to need HD - they do consent/agree  Review of systems:    States she is having some shortness of breath  Denies n/v Not really taking much po  About to work with PT    Intake/Output Summary (Last 24 hours) at 01/29/2022 1352 Last data filed at 01/29/2022 0356 Gross per 24 hour  Intake 992.18 ml  Output 450 ml  Net 542.18 ml    Vitals:  Vitals:   01/28/22 1452 01/28/22 2009 01/28/22 2311 01/29/22 0600  BP: 95/60 116/72 137/82 132/74  Pulse: 94 66 71 73  Resp: '18 18  18  '$ Temp: 97.8 F (36.6 C) 98.3 F (36.8 C) 98.1 F (36.7 C) 98 F (36.7 C)  TempSrc: Oral Oral Oral Oral  SpO2:  97% 98% 96%  Weight:    106.3 kg  Height:         Physical Exam:   General elderly female in bed in no acute distress HEENT normocephalic atraumatic extraocular movements intact sclera anicteric Neck supple trachea midline Lungs clear but reduced to auscultation bilaterally normal work of breathing at rest on oxygen; wet cough Heart S1S2 no rub Abdomen soft nontender nondistended Extremities no edema  Psych normal mood and affect Neuro very hard of hearing and has a device at bedside to help augment sounds; awake and alert and following commands  Access left IJ nontunneled catheter in place   Medications reviewed   Labs:     Latest Ref Rng & Units 01/29/2022    6:07 AM 01/28/2022    4:26 AM 01/27/2022    5:36 AM  BMP  Glucose 70 - 99 mg/dL 104  116  113   BUN 8 - 23 mg/dL 51  45  41   Creatinine 0.44 - 1.00  mg/dL 2.20  2.00  1.72   Sodium 135 - 145 mmol/L 140  137  140   Potassium 3.5 - 5.1 mmol/L 4.2  3.7  3.8   Chloride 98 - 111 mmol/L 101  101  99   CO2 22 - 32 mmol/L '29  29  29   '$ Calcium 8.9 - 10.3 mg/dL 8.5  8.2  8.4      Assessment/Plan:   Pt is a 76 y.o. yo female with history of HTN, HLD, hypothyroidism, PE, presented with SBO underwent emergent ex lap with lysis of adhesion on 8/17, developed anterior dependent respiratory failure, AKI requiring CRRT.   #Acute kidney injury, oligoanuric, dialysis dependent: Likely ischemic ATN from shock/sepsis.  She was on ARB prior to admission.  Initially required CRRT which was stopped on 8/21 and initiated IHD on 8/23.  Seen by palliative care team.   - I have re-ordered strict ins/outs  - hold HD today  - lasix 60 mg IV once now - anticipate HD tomorrow - Should renal function worsen tomorrow and should she continue to need dialysis she will need a tunneled dialysis catheter - she would also need outpatient HD for AKI arrangement.   -  would please choose an alternative to buspirone given her AKI    #Septic shock, off of pressors.  Improved blood pressure    #High-grade small bowel obstruction now status post ex lap and lysis of adhesion.  On TPN.  per primary and general surgery   #Hyponatremia, hypervolemic: resolved   #Anemia of critical illness: Received a unit of blood transfusion on 8/29 and on 9/2.  iron deficiency - s/p repletion. Start aranesp 60 mcg weekly on tuesdays. Transfuse PRBC's as needed.  Dispo - Continue inpatient monitoring   Claudia Desanctis, MD 01/29/2022 2:14 PM

## 2022-01-29 NOTE — Progress Notes (Signed)
PROGRESS NOTE  [name] IRC:[id number] DOB: 04/07/1946   PCP: Chevis Pretty, FNP  Patient is from: Home  DOA: 01/07/2022 LOS: 54  Chief complaints Chief Complaint  Patient presents with   Abdominal Pain     Brief Narrative / Interim history: 76 year old F with PMH of PE, HTN, HLD, hypothyroidism, osteoarthritis and depression returned to [facility] ED with nausea, vomiting and diarrhea and found to small bowel obstruction.  She then developed AKI and septic shock and underwent emergent ex lap with lysis of adhesion on 8/17.  Postoperatively, she remained intubated, started on CRRT and transferred to [facility] on 8/19.  Eventually extubated on 8/21, and started on TNA.  She is started intermittent HD on 8/23.   Hospital course complicated by A-fib with RVR and diarrhea.  Cardiology consulted and she was started on IV amiodarone for A-fib with RVR.  In regards to diarrhea, C. difficile negative.  GIP positive for STEC.  Diarrhea improving.  Patient had repeat CT abdomen as part of evaluation for worsening abdominal pain that was negative for abscess but moderate bilateral pleural effusion.  She underwent right thoracocentesis with removal of 1.1 L on 8/25.  General surgery and nephrology following.  SLP recommended dysphagia 3 diet after FEES on 8/30.  TPN at half rate.  However, p.o. intake remains poor.  IR consulted for cortrack placement after unsuccessful attempt by RD on 9/5.  Disposition likely SNF/LTACH once cleared by nephrology and general surgery.  Still receiving dialysis intermittently.  Subjective: Seen and examined earlier this morning.  No major events overnight of this morning.  No complaints.   Objective: Vitals:   01/28/22 1452 01/28/22 2009 01/28/22 2311 01/29/22 0600  BP: 95/60 116/72 137/82 132/74  Pulse: 94 66 71 73  Resp: '18 18  18  '$ Temp: 97.8 F (36.6 C) 98.3 F (36.8 C) 98.1 F (36.7 C) 98 F (36.7 C)  TempSrc: Oral Oral Oral Oral  SpO2:   97% 98% 96%  Weight:    106.3 kg  Height:        Examination:  GENERAL: No apparent distress.  Nontoxic. HEENT: MMM.  Vision and hearing grossly intact.  NECK: Supple.  No apparent JVD.  RESP:  No IWOB.  Fair aeration bilaterally. CVS:  RRR. Heart sounds normal.  ABD/GI/GU: BS+. Abd soft, NTND.  MSK/EXT:  Moves extremities. No apparent deformity.  Trace BLE edema.  Ace wrap of LUE. SKIN: no apparent skin lesion or wound NEURO: Sleepy but wakes to voice.  Oriented appropriately.  No apparent focal neuro deficit. PSYCH: Calm. Normal affect.   Procedures:  8/17-laparotomy with lysis of adhesion   Microbiology summarized: 8/14-urine culture with multiple species 8/17-MRSA PCR screen negative 8/17-blood cultures NGTD 8/19-MRSA PCR screen negative 8/26-C. difficile PCR negative 8/25-GIP with STEC   Assessment and plan: Principal Problem:   Septic shock ([place]) Active Problems:   Respiratory failure requiring intubation (HCC)   Small bowel obstruction (HCC)   COPD (chronic obstructive pulmonary disease) ([place])   Hyperlipidemia   Hypothyroidism (acquired)   Essential hypertension   History of pulmonary embolus (PE)   AKI (acute kidney injury) ([place])   Acute respiratory failure with hypoxia and hypercapnia (HCC)   Pressure injury of skin   Malnutrition of moderate degree  Septic shock in the setting of mall bowel obstruction and intra-abdominal infection/STEC: Resolved. -Completed antibiotic course with aztreonam and metronidazole on 8/27 -Repeat CT on 8/24 negative for abscess and decreased bowel distention  SBO status post laparotomy  8/17 -Dysphagia 3 diet on 8/30>>  -Now on 1/2 rate TPN and nepro shakes -P.o. intake remains poor. IR consulted for cortrack placement after unsuccessful attempt by RD on 9/5.   Acute Metabolic Encephalopathy: In the setting of septic shock and SBO: Resolved.  Oriented x4. -Reorientation and delirium precaution   Paroxysmal A-fib with  RVR/PVCs: Currently rate controlled. -Continue metoprolol and amiodarone -On IV heparin for anticoagulation.  Transition to p.o. agent prior to discharge -Cardiology signed off.   Acute hypoxic respiratory failure/pulmonary edema/bilateral pleural effusion: Improving. -S/p right thoracocentesis on 8/25 by PCCM -Fluid management by HD per nephrology -Asked RN to wean off oxygen.  Encourage incentive telemetry  AKI/azotemia: Likely from ATN in the setting of sepsis.  Intake and output difficult to monitor due to incontinence Recent Labs    01/21/22 0645 01/22/22 0814 01/23/22 2671 01/24/22 0500 01/24/22 1504 01/25/22 0548 01/26/22 0306 01/27/22 0536 01/28/22 0426 01/29/22 0607  BUN 77* 59* 69* 78* 80* 47* 52* 41* 45* 51*  CREATININE 2.73* 2.14* 2.48* 2.74* 2.71* 1.94* 2.16* 1.72* 2.00* 2.20*  -CRRT discontinued on 8/21.  IHD started on 8/23. -Nephrology following-recommends IHD as needed -Monitor intake and output -Avoid nephrotoxic meds   Infectious diarrhea/gastroenteritis due to STEC: Diarrhea resolved.   Iron deficiency anemia superimposed on anemia of chronic disease: Transfused on 8/21 and 9/2. Recent Labs    01/20/22 0343 01/21/22 0645 01/22/22 0429 01/23/22 0637 01/24/22 0500 01/24/22 1504 01/25/22 0548 01/26/22 0306 01/27/22 0536 01/28/22 0426  HGB 7.2* 7.3* 7.1* 7.4* 7.6* 7.6* 7.2* 7.2* 8.5* 8.6*  -Continue monitoring   Hyponatremia, hypokalemia, hypophosphatemia: Resolved. -Monitor and correct as appropriate   Leukocytosis: Likely demargination.  Resolved.  Chronic LUE lymphedema: -Continue Ace wrap.  Morbid obesity Body mass index is 40.23 kg/m.  Moderate malnutrition Nutrition Problem: Moderate Malnutrition Etiology: acute illness (SBO) Signs/Symptoms: energy intake < 75% for > 7 days, mild muscle depletion, moderate fat depletion Interventions: MVI, TPN, Nepro shake  Pressure skin injury: POA Pressure Injury 01/10/22 Coccyx Medial;Mid  Stage 2 -  Partial thickness loss of dermis presenting as a shallow open injury with a red, pink wound bed without slough. pink (Active)  01/10/22 1200  Location: Coccyx  Location Orientation: Medial;Mid  Staging: Stage 2 -  Partial thickness loss of dermis presenting as a shallow open injury with a red, pink wound bed without slough.  Wound Description (Comments): pink  Present on Admission:   Dressing Type Foam - Lift dressing to assess site every shift 01/28/22 0820     Pressure Injury 01/12/22 Face Left Stage 2 -  Partial thickness loss of dermis presenting as a shallow open injury with a red, pink wound bed without slough. red ulceration beneath ETT holder on L cheek (Active)  01/12/22 1815  Location: Face  Location Orientation: Left  Staging: Stage 2 -  Partial thickness loss of dermis presenting as a shallow open injury with a red, pink wound bed without slough.  Wound Description (Comments): red ulceration beneath ETT holder on L cheek  Present on Admission: Yes  Dressing Type Foam - Lift dressing to assess site every shift 01/26/22 0845     Pressure Injury 01/16/22 Buttocks Left (Active)  01/16/22 1230  Location: Buttocks  Location Orientation: Left  Staging:   Wound Description (Comments):   Present on Admission: Yes  Dressing Type Foam - Lift dressing to assess site every shift 01/27/22 0830   DVT prophylaxis:  Place and maintain sequential compression device Start: 01/20/22 0901  Code Status: DNR/DNI Family Communication: Updated patient's son at bedside. Level of care: Telemetry Medical Status is: Inpatient Remains inpatient appropriate because: Acute kidney failure requiring dialysis, poor p.o. intake   Final disposition: SNF or LTACH Consultants:  Pulmonology Nephrology Cardiology  Sch Meds:  Scheduled Meds:  sodium chloride   Intravenous Once   amiodarone  200 mg Oral Daily   busPIRone  5 mg Oral BID   Chlorhexidine Gluconate Cloth  6 each Topical Q0600    escitalopram  10 mg Oral Daily   feeding supplement (NEPRO CARB STEADY)  237 mL Oral BID BM   ferrous sulfate  325 mg Oral Q breakfast   folic acid  1 mg Oral Daily   guaiFENesin  600 mg Oral BID   metoprolol tartrate  12.5 mg Oral BID   multivitamin with minerals  1 tablet Oral Daily   sodium chloride flush  10-40 mL Intracatheter Q12H   Continuous Infusions:  sodium chloride Stopped (01/15/22 1255)   anticoagulant sodium citrate     heparin 1,650 Units/hr (17/00/17 4944)   TPN CYCLIC-ADULT (ION) 46 mL/hr at 96/75/91 6384   TPN CYCLIC-ADULT (ION)     PRN Meds:.acetaminophen, albuterol, alteplase, alteplase, anticoagulant sodium citrate, guaiFENesin-dextromethorphan, heparin, lidocaine (PF), lidocaine-prilocaine, lip balm, [DISCONTINUED] ondansetron **OR** ondansetron (ZOFRAN) IV, mouth rinse, oxyCODONE, pentafluoroprop-tetrafluoroeth, sodium chloride flush  Antimicrobials: Anti-infectives (From admission, onward)    Start     Dose/Rate Route Frequency Ordered Stop   01/16/22 2000  aztreonam (AZACTAM) 1 g in sodium chloride 0.9 % 100 mL IVPB  Status:  Discontinued        1 g 200 mL/hr over 30 Minutes Intravenous Every 8 hours 01/16/22 1840 01/20/22 1254   01/16/22 2000  metroNIDAZOLE (FLAGYL) IVPB 500 mg  Status:  Discontinued        500 mg 100 mL/hr over 60 Minutes Intravenous Every 8 hours 01/16/22 1840 01/20/22 1254   01/15/22 0526  aztreonam (AZACTAM) 1 g in sodium chloride 0.9 % 100 mL IVPB  Status:  Discontinued       See Hyperspace for full Linked Orders Report.   1 g 200 mL/hr over 30 Minutes Intravenous Every 24 hours 01/14/22 1529 01/14/22 1534   01/15/22 0526  aztreonam (AZACTAM) 1 g in sodium chloride 0.9 % 100 mL IVPB       See Hyperspace for full Linked Orders Report.   1 g 200 mL/hr over 30 Minutes Intravenous Every 24 hours 01/14/22 1534 01/15/22 0504   01/13/22 2100  vancomycin (VANCOCIN) IVPB 1000 mg/200 mL premix  Status:  Discontinued        1,000 mg 200  mL/hr over 60 Minutes Intravenous Every 24 hours 01/12/22 2135 01/13/22 1027   01/13/22 0500  aztreonam (AZACTAM) 2 g in sodium chloride 0.9 % 100 mL IVPB  Status:  Discontinued       See Hyperspace for full Linked Orders Report.   2 g 200 mL/hr over 30 Minutes Intravenous Every 12 hours 01/12/22 2135 01/14/22 1529   01/12/22 2015  vancomycin (VANCOREADY) IVPB 1750 mg/350 mL        1,750 mg 175 mL/hr over 120 Minutes Intravenous  Once 01/12/22 1921 01/12/22 2259   01/11/22 1800  aztreonam (AZACTAM) 1 g in sodium chloride 0.9 % 100 mL IVPB  Status:  Discontinued       See Hyperspace for full Linked Orders Report.   1 g 200 mL/hr over 30 Minutes Intravenous Every 8 hours 01/11/22 0815 01/12/22 2135  01/11/22 1000  levofloxacin (LEVAQUIN) IVPB 500 mg  Status:  Discontinued        500 mg 100 mL/hr over 60 Minutes Intravenous Every 48 hours 01/10/22 1432 01/11/22 0744   01/11/22 0915  aztreonam (AZACTAM) 2 g in sodium chloride 0.9 % 100 mL IVPB       See Hyperspace for full Linked Orders Report.   2 g 200 mL/hr over 30 Minutes Intravenous  Once 01/11/22 0815 01/11/22 1045   01/11/22 0845  aztreonam (AZACTAM) 2 g in sodium chloride 0.9 % 100 mL IVPB  Status:  Discontinued        2 g 200 mL/hr over 30 Minutes Intravenous  Once 01/11/22 0747 01/11/22 0815   01/11/22 0845  metroNIDAZOLE (FLAGYL) IVPB 500 mg  Status:  Discontinued        500 mg 100 mL/hr over 60 Minutes Intravenous Every 12 hours 01/11/22 0747 01/15/22 0851   01/10/22 1900  levofloxacin (LEVAQUIN) IVPB 500 mg  Status:  Discontinued        500 mg 100 mL/hr over 60 Minutes Intravenous Every 24 hours 01/10/22 1426 01/10/22 1432   01/10/22 0700  ciprofloxacin (CIPRO) IVPB 400 mg        400 mg 200 mL/hr over 60 Minutes Intravenous On call to O.R. 01/10/22 0612 01/10/22 1052   01/10/22 0700  metroNIDAZOLE (FLAGYL) IVPB 500 mg  Status:  Discontinued        500 mg 100 mL/hr over 60 Minutes Intravenous On call to O.R. 01/10/22 0612  01/10/22 1123   01/08/22 0830  Levofloxacin (LEVAQUIN) IVPB 250 mg  Status:  Discontinued        250 mg 50 mL/hr over 60 Minutes Intravenous Every 24 hours 01/08/22 0724 01/08/22 0727   01/08/22 0830  Levofloxacin (LEVAQUIN) IVPB 250 mg  Status:  Discontinued        250 mg 50 mL/hr over 60 Minutes Intravenous Every 48 hours 01/08/22 0727 01/08/22 0732   01/08/22 0830  Levofloxacin (LEVAQUIN) IVPB 250 mg  Status:  Discontinued        250 mg 50 mL/hr over 60 Minutes Intravenous Every 24 hours 01/08/22 0732 01/10/22 1426        I have personally reviewed the following labs and images: CBC: Recent Labs  Lab 01/24/22 0500 01/24/22 1504 01/25/22 0548 01/26/22 0306 01/27/22 0536 01/28/22 0426  WBC 11.2* 10.9* 9.1 8.4 7.6  --   HGB 7.6* 7.6* 7.2* 7.2* 8.5* 8.6*  HCT 23.8* 24.7* 22.5* 22.7* 27.4* 27.3*  MCV 89.1 90.5 89.6 90.4 89.5  --   PLT 532* 560* 541* 526* 519*  --    BMP &GFR Recent Labs  Lab 01/24/22 0500 01/24/22 1504 01/25/22 0548 01/26/22 0306 01/27/22 0536 01/28/22 0426 01/29/22 0607  NA 135 136 136 138 140 137 140  K 3.8 3.9 3.6 3.4* 3.8 3.7 4.2  CL 100 100 99 102 99 101 101  CO2 '27 28 30 29 29 29 29  '$ GLUCOSE 105* 95 105* 112* 113* 116* 104*  BUN 78* 80* 47* 52* 41* 45* 51*  CREATININE 2.74* 2.71* 1.94* 2.16* 1.72* 2.00* 2.20*  CALCIUM 8.2* 8.3* 8.0* 8.4* 8.4* 8.2* 8.5*  MG 2.3  --  2.0 2.2 1.9 2.1  --   PHOS 4.3 4.4  --   --  2.9 3.2 3.8   Estimated Creatinine Clearance: 26.3 mL/min (A) (by C-G formula based on SCr of 2.2 mg/dL (H)). Liver & Pancreas: Recent Labs  Lab 01/24/22 0500 01/24/22  1504 01/27/22 0536 01/28/22 0426 01/29/22 0607  AST 15  --   --  16  --   ALT 15  --   --  17  --   ALKPHOS 88  --   --  113  --   BILITOT 0.6  --   --  0.6  --   PROT 5.1*  --   --  5.5*  --   ALBUMIN 1.8* 1.9* 1.8* 1.9* 1.9*   No results for input(s): "LIPASE", "AMYLASE" in the last 168 hours. No results for input(s): "AMMONIA" in the last 168  hours. Diabetic: No results for input(s): "HGBA1C" in the last 72 hours. Recent Labs  Lab 01/27/22 2342 01/28/22 1116 01/28/22 1726 01/28/22 2320 01/29/22 0553  GLUCAP 118* 117* 99 112* 75   Cardiac Enzymes: No results for input(s): "CKTOTAL", "CKMB", "CKMBINDEX", "TROPONINI" in the last 168 hours. No results for input(s): "PROBNP" in the last 8760 hours. Coagulation Profile: No results for input(s): "INR", "PROTIME" in the last 168 hours. Thyroid Function Tests: No results for input(s): "TSH", "T4TOTAL", "FREET4", "T3FREE", "THYROIDAB" in the last 72 hours. Lipid Profile: Recent Labs    01/28/22 0426  TRIG 45    Anemia Panel: No results for input(s): "VITAMINB12", "FOLATE", "FERRITIN", "TIBC", "IRON", "RETICCTPCT" in the last 72 hours.  Urine analysis:    Component Value Date/Time   COLORURINE YELLOW 01/11/2022 1051   APPEARANCEUR CLOUDY (A) 01/11/2022 1051   APPEARANCEUR Clear 03/01/2021 1257   LABSPEC 1.010 01/11/2022 1051   PHURINE 5.0 01/11/2022 1051   GLUCOSEU NEGATIVE 01/11/2022 1051   HGBUR SMALL (A) 01/11/2022 1051   BILIRUBINUR NEGATIVE 01/11/2022 1051   BILIRUBINUR Negative 03/01/2021 [place] 01/11/2022 1051   PROTEINUR NEGATIVE 01/11/2022 1051   NITRITE NEGATIVE 01/11/2022 1051   LEUKOCYTESUR LARGE (A) 01/11/2022 1051   Sepsis Labs: Invalid input(s): "PROCALCITONIN", "LACTICIDVEN"  Microbiology: Recent Results (from the past 240 hour(s))  C Difficile Quick Screen (NO PCR Reflex)     Status: None   Collection Time: 01/19/22  6:32 PM   Specimen: STOOL  Result Value Ref Range Status   C Diff antigen NEGATIVE NEGATIVE Final   C Diff toxin NEGATIVE NEGATIVE Final   C Diff interpretation No C. difficile detected.  Final    Comment: Performed at [facility], [place] [address] [place], [place] [address]    Radiology Studies: DG Abd Portable 1V  Result Date: 01/29/2022 CLINICAL DATA:  Feeding tube placement EXAM: PORTABLE  ABDOMEN - 1 VIEW COMPARISON:  01/16/2022 FINDINGS: Nonobstructive pattern of included bowel gas. Non weighted enteric feeding tube is positioned with tip near the gastroesophageal junction. No obvious free air on supine radiograph. IMPRESSION: Non weighted enteric feeding tube is positioned with tip near the gastroesophageal junction. Recommend advancement to ensure subdiaphragmatic and post pyloric positioning. Electronically Signed   By: Delanna Ahmadi M.D.   On: 01/29/2022 11:21      Jahquan Klugh T. [place]  If 7PM-7AM, please contact night-coverage [email or web address] 01/29/2022, 11:40 AM

## 2022-01-29 NOTE — TOC Progression Note (Addendum)
Transition of Care [facility]) - Progression Note    Patient Details  Name: [name] MRN: [id number] Date of Birth: 1946-05-18  Transition of Care [facility]) CM/SW [place], [place] Phone Number: 01/29/2022, 3:16 PM  Clinical Narrative:     CSW followed up with patient and patients son Larkin Ina regarding patients dc plan. Patients son currently is leaning towards patient going to [facility] if she needs HD. If not he would like for her to go to a SNF facility near where they live. CSW informed MD and CM.CSW completed assessment and addressed concerns with patient that her son initially had about her returning back home with her daughter after rehab. Patient reports she felt safe with daughter at home. Patient reports her plan may change after rehab depending on her care needs.Patient reports she has a great supportive family that can help assist when needed.All questions answered. No further questions reported at this time.CSW will continue to follow and assist with patients dc planning needs.       Expected Discharge Plan: Home/Self Care Barriers to Discharge: Continued Medical Work up  Expected Discharge Plan and Services Expected Discharge Plan: Home/Self Care                                               Social Determinants of Health (SDOH) Interventions    Readmission Risk Interventions     No data to display

## 2022-01-29 NOTE — Progress Notes (Signed)
Physical Therapy Treatment Patient Details Name: [name] MRN: [id number] DOB: 13-Jan-1946 Today's Date: 01/29/2022   History of Present Illness 76 yo female admitted 8/14 to [facility] with vomiting and diarrhea with partial SBO. 8/17 intubated and ex lap. 8/19 pt with worsening renal function, transfer to [facility] and CRRT started. 8/21 extubation with CRRT off. PMHx: COPD, HLD, HTN, PE, hypothyroidism, OA and depression    PT Comments    Pt anxious today, in part because of discomfort of cortrack and in part due to going down soon for procedure in IR. Listened to pt's concerns and tried to encourage her and maintained slack on cortrack tubing throughout session. Pt worked on sit to stand within stedy from bed and from flaps of stedy as well as tolerance for maintaining crouched position on flaps of stedy and full standing. She was able to stand with UE support and mod A +2 for 60 secs. Maintained positioning OOB for >10 mins. Pt encouraged by increased tolerance today. Transport arrived end of session so did not transfer to chair. PT will continue to follow.    Recommendations for follow up therapy are one component of a multi-disciplinary discharge planning process, led by the attending physician.  Recommendations may be updated based on patient status, additional functional criteria and insurance authorization.  Follow Up Recommendations  Skilled nursing-short term rehab (<3 hours/day) Can patient physically be transported by private vehicle: No   Assistance Recommended at Discharge Frequent or constant Supervision/Assistance  Patient can return home with the following Two people to help with walking and/or transfers;Direct supervision/assist for medications management;Two people to help with bathing/dressing/bathroom;Assist for transportation;Direct supervision/assist for financial management   Equipment Recommendations  Other (comment);Hospital bed    Recommendations for Other Services        Precautions / Restrictions Precautions Precautions: Fall Restrictions Weight Bearing Restrictions: No     Mobility  Bed Mobility Overal bed mobility: Needs Assistance Bed Mobility: Rolling Rolling: Mod assist   Supine to sit: +2 for safety/equipment, Mod assist Sit to supine: +2 for physical assistance, Max assist   General bed mobility comments: rolled R and L for positioning and pad placement. Pt able to initiate roll but needs mod A +2 to come completely onto her side. Mod A at LE's and trunk for supine to sit. Pt needed max A +2 at LE's and trunk for return to supine.    Transfers Overall transfer level: Needs assistance Equipment used: Ambulation equipment used Charlaine Dalton) Transfers: Sit to/from Stand Sit to Stand: From elevated surface, Max assist, +2 physical assistance, Mod assist           General transfer comment: max A +2 with use of pad for power up to standing. Mod A +2 to stand from flaps of stedy. Worked on tolerance for maintaining crouched position on stedy flaps as well as standing tolerance. Pt stood for 60 secs at a time 2x.    Ambulation/Gait               General Gait Details: unable to step feet in standing   Stairs             Wheelchair Mobility    Modified Rankin (Stroke Patients Only)       Balance Overall balance assessment: Needs assistance Sitting-balance support: Feet supported Sitting balance-Leahy Scale: Fair Sitting balance - Comments: once pt assisted in scooting to EOB, able to maintain sitting with supervision   Standing balance support: Bilateral upper extremity supported Standing balance-Leahy Scale:  Poor Standing balance comment: achieved full standing with UE support today, mod A +2 to maintain                            Cognition Arousal/Alertness: Awake/alert Behavior During Therapy: WFL for tasks assessed/performed, Anxious Overall Cognitive Status: Impaired/Different from baseline Area of  Impairment: Memory, Attention, Following commands                   Current Attention Level: Selective Memory: Decreased short-term memory Following Commands: Follows one step commands consistently Safety/Judgement: Decreased awareness of deficits Awareness: Emergent Problem Solving: Slow processing General Comments: very HOH. anxious about IR procedure happening soon. Followed commands well during mobility        Exercises      General Comments General comments (skin integrity, edema, etc.): IR arrived to take pt to procedure so no transfer to chair. VSS on 3L [place]      Pertinent Vitals/Pain Pain Assessment Pain Assessment: Faces Faces Pain Scale: Hurts a little bit Pain Location: nose/ throat Pain Descriptors / Indicators: Grimacing, Aching Pain Intervention(s): Limited activity within patient's tolerance, Monitored during session    Home Living                          Prior Function            PT Goals (current goals can now be found in the care plan section) Acute Rehab PT Goals Patient Stated Goal: be able to go home PT Goal Formulation: With patient Time For Goal Achievement: 02/12/22 Potential to Achieve Goals: Fair Progress towards PT goals: Progressing toward goals    Frequency    Min 3X/week      PT Plan Current plan remains appropriate    Co-evaluation              AM-PAC PT "6 Clicks" Mobility   Outcome Measure  Help needed turning from your back to your side while in a flat bed without using bedrails?: A Lot Help needed moving from lying on your back to sitting on the side of a flat bed without using bedrails?: Total Help needed moving to and from a bed to a chair (including a wheelchair)?: Total Help needed standing up from a chair using your arms (e.g., wheelchair or bedside chair)?: Total Help needed to walk in hospital room?: Total Help needed climbing 3-5 steps with a railing? : Total 6 Click Score: 7    End of  Session Equipment Utilized During Treatment: Oxygen Activity Tolerance: Patient tolerated treatment well Patient left: in bed;with call bell/phone within reach Nurse Communication: Other (comment) (IR present) PT Visit Diagnosis: Other abnormalities of gait and mobility (R26.89);Difficulty in walking, not elsewhere classified (R26.2);Muscle weakness (generalized) (M62.81)     Time: 0630-1601 PT Time Calculation (min) (ACUTE ONLY): 28 min  Charges:  $Therapeutic Activity: 23-37 mins                     Leighton Roach, PT  Acute Rehab Services Secure chat preferred Office [place] 01/29/2022, 4:10 PM

## 2022-01-30 ENCOUNTER — Telehealth (HOSPITAL_COMMUNITY): Payer: Self-pay | Admitting: Pharmacy Technician

## 2022-01-30 ENCOUNTER — Other Ambulatory Visit (HOSPITAL_COMMUNITY): Payer: Self-pay

## 2022-01-30 DIAGNOSIS — A419 Sepsis, unspecified organism: Secondary | ICD-10-CM | POA: Diagnosis not present

## 2022-01-30 DIAGNOSIS — J449 Chronic obstructive pulmonary disease, unspecified: Secondary | ICD-10-CM | POA: Diagnosis not present

## 2022-01-30 DIAGNOSIS — Z7189 Other specified counseling: Secondary | ICD-10-CM | POA: Diagnosis not present

## 2022-01-30 DIAGNOSIS — Z515 Encounter for palliative care: Secondary | ICD-10-CM | POA: Diagnosis not present

## 2022-01-30 DIAGNOSIS — J9690 Respiratory failure, unspecified, unspecified whether with hypoxia or hypercapnia: Secondary | ICD-10-CM | POA: Diagnosis not present

## 2022-01-30 DIAGNOSIS — K56609 Unspecified intestinal obstruction, unspecified as to partial versus complete obstruction: Secondary | ICD-10-CM | POA: Diagnosis not present

## 2022-01-30 LAB — GLUCOSE, CAPILLARY
Glucose-Capillary: 112 mg/dL — ABNORMAL HIGH (ref 70–99)
Glucose-Capillary: 115 mg/dL — ABNORMAL HIGH (ref 70–99)
Glucose-Capillary: 130 mg/dL — ABNORMAL HIGH (ref 70–99)
Glucose-Capillary: 137 mg/dL — ABNORMAL HIGH (ref 70–99)

## 2022-01-30 LAB — RENAL FUNCTION PANEL
Albumin: 1.7 g/dL — ABNORMAL LOW (ref 3.5–5.0)
Anion gap: 6 (ref 5–15)
BUN: 60 mg/dL — ABNORMAL HIGH (ref 8–23)
CO2: 29 mmol/L (ref 22–32)
Calcium: 8.2 mg/dL — ABNORMAL LOW (ref 8.9–10.3)
Chloride: 104 mmol/L (ref 98–111)
Creatinine, Ser: 2.43 mg/dL — ABNORMAL HIGH (ref 0.44–1.00)
GFR, Estimated: 20 mL/min — ABNORMAL LOW (ref >60)
Glucose, Bld: 119 mg/dL — ABNORMAL HIGH (ref 70–99)
Phosphorus: 3.6 mg/dL (ref 2.5–4.6)
Potassium: 3.9 mmol/L (ref 3.5–5.1)
Sodium: 139 mmol/L (ref 135–145)

## 2022-01-30 LAB — HEPARIN LEVEL (UNFRACTIONATED): Heparin Unfractionated: 0.45 IU/mL (ref 0.30–0.70)

## 2022-01-30 MED ORDER — ALPRAZOLAM 0.25 MG PO TABS
0.2500 mg | ORAL_TABLET | Freq: Four times a day (QID) | ORAL | Status: DC | PRN
Start: 2022-01-30 — End: 2022-02-04
  Administered 2022-02-04 (×2): 0.25 mg via ORAL
  Filled 2022-01-30 (×2): qty 1

## 2022-01-30 MED ORDER — STERILE WATER FOR INJECTION IV SOLN
INTRAVENOUS | Status: AC
  Filled 2022-01-30: qty 737.33

## 2022-01-30 MED ORDER — VANCOMYCIN HCL IN DEXTROSE 1-5 GM/200ML-% IV SOLN
1000.0000 mg | INTRAVENOUS | Status: AC
Start: 2022-01-31 — End: 2022-02-01
  Filled 2022-01-30: qty 200

## 2022-01-30 MED ORDER — PROSOURCE TF20 ENFIT COMPATIBL EN LIQD
60.0000 mL | Freq: Two times a day (BID) | ENTERAL | Status: DC
  Administered 2022-01-30 – 2022-02-03 (×10): 60 mL
  Filled 2022-01-30 (×10): qty 60

## 2022-01-30 MED ORDER — ORAL CARE MOUTH RINSE: 15.0000 mL | OROMUCOSAL | Status: DC | PRN

## 2022-01-30 MED ORDER — ORAL CARE MOUTH RINSE
15.0000 mL | OROMUCOSAL | Status: DC
  Administered 2022-01-30 – 2022-02-04 (×15): 15 mL via OROMUCOSAL

## 2022-01-30 MED ORDER — OSMOLITE 1.5 CAL PO LIQD
1000.0000 mL | ORAL | Status: DC
  Administered 2022-01-30 – 2022-02-03 (×5): 1000 mL
  Filled 2022-01-30 (×7): qty 1000

## 2022-01-30 MED ORDER — LOPERAMIDE HCL 2 MG PO CAPS
2.0000 mg | ORAL_CAPSULE | ORAL | Status: DC | PRN
Start: 2022-01-30 — End: 2022-02-04
  Administered 2022-01-31 – 2022-02-04 (×4): 2 mg via ORAL
  Filled 2022-01-30 (×5): qty 1

## 2022-01-30 NOTE — Progress Notes (Signed)
Occupational Therapy Treatment Patient Details Name: [name] MRN: [id number] DOB: [birth year] Today's Date: 01/30/2022   History of present illness 76 yo female admitted 8/14 to [facility] with vomiting and diarrhea with partial SBO. 8/17 intubated and ex lap. 8/19 pt with worsening renal function, transfer to [facility] and CRRT started. 8/21 extubation with CRRT off. PMHx: COPD, HLD, HTN, PE, hypothyroidism, OA and depression   OT comments  Patient continues to make steady progress towards goals in skilled OT session. Patient's session encompassed  bed level exercises in order to promote increased activity tolerance OOB. Patient required AAROM for majority of exercises, and elongated breaks due to fatigue. OT recommendation remains appropriate, therapy will continue to follow.    Recommendations for follow up therapy are one component of a multi-disciplinary discharge planning process, led by the attending physician.  Recommendations may be updated based on patient status, additional functional criteria and insurance authorization.    Follow Up Recommendations  Skilled nursing-short term rehab (<3 hours/day)    Assistance Recommended at Discharge Frequent or constant Supervision/Assistance  Patient can return home with the following  Help with stairs or ramp for entrance;Assistance with feeding;Assistance with cooking/housework;Assist for transportation;Two people to help with walking and/or transfers;Direct supervision/assist for financial management;Two people to help with bathing/dressing/bathroom;Direct supervision/assist for medications management   Equipment Recommendations  Wheelchair cushion (measurements OT);Wheelchair (measurements OT)    Recommendations for Other Services      Precautions / Restrictions Precautions Precautions: Fall Restrictions Weight Bearing Restrictions: No       Mobility Bed Mobility                    Transfers                          Balance                                           ADL either performed or assessed with clinical judgement   ADL Overall ADL's : Needs assistance/impaired                                       General ADL Comments: Patient session focus on increasing activity tolerance with bed level exercises    Extremity/Trunk Assessment              Vision       Perception     Praxis      Cognition Arousal/Alertness: Awake/alert Behavior During Therapy: WFL for tasks assessed/performed Overall Cognitive Status: Within Functional Limits for tasks assessed                                 General Comments: very HOH. anxious about dialysis, followed commands well during bed level exercises        Exercises General Exercises - Lower Extremity Ankle Circles/Pumps: AROM, Both, Supine, 15 reps Quad Sets: AROM, Both, Supine, 10 reps Heel Slides: AAROM, Both, Supine, 10 reps Hip ABduction/ADduction: AROM, Both, 10 reps, Supine Straight Leg Raises: AAROM, Both, 10 reps, Supine    Shoulder Instructions       General Comments      Pertinent Vitals/ Pain       Pain  Assessment Pain Assessment: Faces Faces Pain Scale: Hurts little more Pain Location: abdominal discomfort, headache Pain Descriptors / Indicators: Headache, Discomfort, Grimacing, Guarding Pain Intervention(s): Limited activity within patient's tolerance, Monitored during session, Repositioned  Home Living                                          Prior Functioning/Environment              Frequency  Min 2X/week        Progress Toward Goals  OT Goals(current goals can now be found in the care plan section)  Progress towards OT goals: Progressing toward goals  Acute Rehab OT Goals Patient Stated Goal: to feel better OT Goal Formulation: With patient Time For Goal Achievement: 01/31/22 Potential to Achieve Goals: [place] Discharge  plan remains appropriate    Co-evaluation                 AM-PAC OT "6 Clicks" Daily Activity     Outcome Measure   Help from another person eating meals?: Total Help from another person taking care of personal grooming?: A Lot Help from another person toileting, which includes using toliet, bedpan, or urinal?: Total Help from another person bathing (including washing, rinsing, drying)?: A Lot Help from another person to put on and taking off regular upper body clothing?: A Lot Help from another person to put on and taking off regular lower body clothing?: Total 6 Click Score: 9    End of Session    OT Visit Diagnosis: Muscle weakness (generalized) (M62.81)   Activity Tolerance Patient tolerated treatment well   Patient Left in bed;with call bell/phone within reach;with bed alarm set   Nurse Communication Mobility status        Time: 0300-9233 OT Time Calculation (min): 23 min  Charges: OT General Charges $OT Visit: 1 Visit OT Treatments $Therapeutic Exercise: 23-37 mins  Corinne Ports E. Ahmarion Saraceno, OTR/L Acute Rehabilitation Services [phone]   Ascencion Dike 01/30/2022, 3:07 PM

## 2022-01-30 NOTE — Progress Notes (Signed)
Speech Language Pathology Treatment: Dysphagia  Patient Details Name: [name] MRN: [id number] DOB: 12/19/1945 Today's Date: 01/30/2022 Time: 0867-6195 SLP Time Calculation (min) (ACUTE ONLY): 30 min  Assessment / Plan / Recommendation Clinical Impression  Pt seen at bedside to assess diet tolerance and readiness to advance from Dys3/thin liquid diet. RN reports pt is not eating well, and Cortrak feeds are likely to begin soon. Pt was awake and alert, cooperative. No family present at this time. Pt indicated to SLP that aside from the Cortrak placement, she feels "blown up" whenever she eats. Additionally, meats cause nausea that decreases desire to eat. Pt appears to tolerate thin liquids and solid textures without overt s/s aspiration.   Recommend a trial of a vegetarian diet, to eliminate meats temporarily, and hopefully facilitate PO intake. RN and MD were informed of pt's repeated report of abdominal discomfort with PO intake. Pt is in agreement with changing to vegetarian trays, and was encouraged that changing the diet is very easy when/if she decides to try meats again. SLP will continue to follow to assess tolerance.   HPI HPI: Patient is a 76 y.o. female with PMH: COPD, HLD, essential HTN, hypothyroidism, PE, osteoarthritis, depression, GERD. She presented initially to urgent care on 01/04/22 with acute onset abdominal pain, had CT which was unremarkable and she received IV fluids and antiemetics and was discharged hom. She presented to the hospital from home on 01/07/22 with vomiting and diarrhea since 01/04/22. In ED CT scan showed partial SBO. Surgery consulted and NG ordered. 8/15, had worsening renal function; 8/17 she went into shock and required emergent Exploratory laparotomy performed in OR and patient remained on vent on pressors post op. She arrived at [facility] on 8/19 and CRRT started; 8/21 she was extubated, TNA started and CRRT discontinued.      SLP Plan  Continue with current plan  of care      Recommendations for follow up therapy are one component of a multi-disciplinary discharge planning process, led by the attending physician.  Recommendations may be updated based on patient status, additional functional criteria and insurance authorization.    Recommendations  Diet recommendations: Other(comment);Thin liquid (trial of vegetarian diet - pt reports meats cause abdominal issues) Liquids provided via: Cup;Straw Medication Administration: Other (Comment) (as tolerated) Supervision: Full supervision/cueing for compensatory strategies;Staff to assist with self feeding Compensations: Slow rate;Small sips/bites;Minimize environmental distractions Postural Changes and/or Swallow Maneuvers: Seated upright 90 degrees;Upright 30-60 min after meal                Oral Care Recommendations: Oral care BID Follow Up Recommendations: No SLP follow up Assistance recommended at discharge: Frequent or constant Supervision/Assistance SLP Visit Diagnosis: Dysphagia, oropharyngeal phase (R13.12) Plan: Continue with current plan of care          Read Bonelli B. Quentin Ore, [facility], [place] Speech Language Pathologist Office: [phone]  Shonna Chock 01/30/2022, 11:24 AM

## 2022-01-30 NOTE — Progress Notes (Signed)
Received patient in bed to unit.  Alert and oriented.  Informed consent signed and in chart.   Treatment initiated: 1626 Treatment completed: 2000  Patient tolerated well.  Transported back to the room  Alert, without acute distress.  Hand-off given to patient's nurse.   Access used: catheter Access issues: none  Total UF removed: 2.5 L Medication(s) given: oxycodone 5 mg Po Post HD VS: 127/74. P88 R 29  Post HD weight: 105.6kg   [facility]yl[facility]

## 2022-01-30 NOTE — Progress Notes (Signed)
Chief Complaint: Patient was seen in consultation today for durable HD access   Referring Physician(s): Dr. Harrie Jeans  Supervising Physician: Corrie Mckusick  Patient Status: [facility] - In-pt  History of Present Illness: [name] is a 76 y.o. female with acute renal failure/ATN from sepsis/shock. She is now HD dependent and has (L)IJ trialysis. It appears she will need HD for an extended duration and will need more durable HD access. IR is asked to place tunneled HD catheter. PMHx, meds, labs, imaging, allergies reviewed. Feels some better. Sepsis/shock now resolved. Son at bedside.   Past Medical History:  Diagnosis Date   Allergy    Bronchitis, chronic (HCC)    Chronic anxiety    Complication of anesthesia    hard to wake up   COPD (chronic obstructive pulmonary disease) (HCC)    Depression    Esophageal reflux    Headache(784.0)    Hyperlipidemia    Hypertension    Hypothyroidism    Obesity    Osteoporosis    Overactive bladder    PVC's (premature ventricular contractions)    Thyroid disease    Vitamin D deficiency disease     Past Surgical History:  Procedure Laterality Date   ABDOMINAL HYSTERECTOMY     CENTRAL LINE INSERTION Right 01/10/2022   Procedure: CENTRAL LINE INSERTION;  Surgeon: Virl Cagey, MD;  Location: [facility];  Service: General;  Laterality: Right;   CHOLECYSTECTOMY N/A 11/04/2013   Procedure: LAPAROSCOPIC CHOLECYSTECTOMY WITH INTRAOPERATIVE CHOLANGIOGRAM;  Surgeon: Imogene Burn. Georgette Dover, MD;  Location: [place];  Service: General;  Laterality: N/A;   COLONOSCOPY     FRACTURE SURGERY     pins removed from Hip surgery   HIP FRACTURE SURGERY  1990    pins removed in [place] 01/10/2022   Procedure: EXPLORATORY LAPAROTOMY,;  Surgeon: Virl Cagey, MD;  Location: [facility];  Service: General;  Laterality: N/A;   LYSIS OF ADHESION N/A 01/10/2022   Procedure: LYSIS OF ADHESION;  Surgeon: Virl Cagey, MD;  Location: [facility];   Service: General;  Laterality: N/A;   [place] N/A 01/18/2022   Procedure: THORACENTESIS;  Surgeon: Candee Furbish, MD;  Location: [facility];  Service: Pulmonary;  Laterality: N/A;   UPPER GASTROINTESTINAL ENDOSCOPY      Allergies: Celebrex [celecoxib], Keflet [cephalexin], Penicillins, Sulfa antibiotics, Kenalog [triamcinolone acetonide], Latex, Lisinopril, and Mobic [meloxicam]  Medications:  Current Facility-Administered Medications:    0.9 %  sodium chloride infusion (Manually program via Guardrails IV Fluids), , Intravenous, Once, Gonfa, Taye T, MD   0.9 %  sodium chloride infusion, 250 mL, Intravenous, Continuous, Shahmehdi, Seyed A, MD, Stopped at 01/15/22 1255   acetaminophen (TYLENOL) tablet 650 mg, 650 mg, Oral, Q6H PRN, Regalado, Belkys A, MD, 650 mg at 01/29/22 1508   albuterol (PROVENTIL) (2.5 MG/3ML) 0.083% nebulizer solution 2.5 mg, 2.5 mg, Nebulization, Q6H PRN, Masters, Katie, DO, 2.5 mg at 01/19/22 2256   ALPRAZolam (XANAX) tablet 0.25 mg, 0.25 mg, Oral, Q6H PRN, Cyndia Skeeters, Taye T, MD   alteplase (CATHFLO ACTIVASE) injection 2 mg, 2 mg, Intracatheter, Once PRN, Masters, Katie, DO   alteplase (CATHFLO ACTIVASE) injection 2 mg, 2 mg, Intracatheter, Once PRN, Rosita Fire, MD   [COMPLETED] amiodarone (PACERONE) tablet 400 mg, 400 mg, Oral, BID, 400 mg at 01/23/22 0003 **FOLLOWED BY** [COMPLETED] amiodarone (PACERONE) tablet 400 mg, 400 mg, Oral, Daily, 400 mg at 01/26/22 1100 **  FOLLOWED BY** amiodarone (PACERONE) tablet 200 mg, 200 mg, Oral, Daily, Lars Mage T, MD, 200 mg at 01/30/22 0950   anticoagulant sodium citrate solution 5 mL, 5 mL, Intracatheter, PRN, Rosita Fire, MD   Chlorhexidine Gluconate Cloth 2 % PADS 6 each, 6 each, Topical, Q0600, Rexene Agent, MD, 6 each at 01/29/22 1200   Chlorhexidine Gluconate Cloth 2 % PADS 6 each, 6 each, Topical, Q0600, Claudia Desanctis, MD, 6 each at 01/30/22  0421   Darbepoetin Alfa (ARANESP) injection 60 mcg, 60 mcg, Subcutaneous, Q Tue-1800, Harrie Jeans C, MD, 60 mcg at 01/29/22 1828   escitalopram (LEXAPRO) tablet 10 mg, 10 mg, Oral, Daily, Regalado, Belkys A, MD, 10 mg at 01/30/22 0951   feeding supplement (NEPRO CARB STEADY) liquid 237 mL, 237 mL, Oral, BID BM, Regalado, Belkys A, MD, 237 mL at 01/29/22 0913   feeding supplement (OSMOLITE 1.5 CAL) liquid 1,000 mL, 1,000 mL, Per Tube, Continuous, Gonfa, Taye T, MD, Last Rate: 25 mL/hr at 01/30/22 1440, 1,000 mL at 01/30/22 1440   feeding supplement (PROSource TF20) liquid 60 mL, 60 mL, Per Tube, BID, Gonfa, Taye T, MD, 60 mL at 01/30/22 1443   ferrous sulfate tablet 325 mg, 325 mg, Oral, Q breakfast, Regalado, Belkys A, MD, 325 mg at 29/92/42 6834   folic acid (FOLVITE) tablet 1 mg, 1 mg, Oral, Daily, Regalado, Belkys A, MD, 1 mg at 01/30/22 0951   guaiFENesin (MUCINEX) 12 hr tablet 600 mg, 600 mg, Oral, BID, Regalado, Belkys A, MD, 600 mg at 01/30/22 0951   guaiFENesin-dextromethorphan (ROBITUSSIN DM) 100-10 MG/5ML syrup 5 mL, 5 mL, Oral, Q4H PRN, Florencia Reasons, MD, 5 mL at 01/28/22 2304   heparin ADULT infusion 100 units/mL (25000 units/229m), 1,650 Units/hr, Intravenous, Continuous, WSandford Craze RPH, Last Rate: 16.5 mL/hr at 01/30/22 0728, 1,650 Units/hr at 01/30/22 0728   heparin injection 1,000 Units, 1,000 Units, Intracatheter, PRN, BRosita Fire MD   lidocaine (PF) (XYLOCAINE) 1 % injection 5 mL, 5 mL, Intradermal, PRN, BRosita Fire MD   lidocaine-prilocaine (EMLA) cream 1 Application, 1 Application, Topical, PRN, BRosita Fire MD   lip balm (CARMEX) ointment, , Topical, PRN, Masters, Katie, DO   loperamide (IMODIUM) capsule 2 mg, 2 mg, Oral, PRN, GCyndia Skeeters Taye T, MD   metoprolol tartrate (LOPRESSOR) tablet 12.5 mg, 12.5 mg, Oral, BID, Chandrasekhar, Mahesh A, MD, 12.5 mg at 01/30/22 0951   multivitamin with minerals tablet 1 tablet, 1 tablet, Oral, Daily, Bell, Lorin  C, RPH, 1 tablet at 01/30/22 0950   [DISCONTINUED] ondansetron (ZOFRAN) tablet 4 mg, 4 mg, Oral, Q6H PRN **OR** ondansetron (ZOFRAN) injection 4 mg, 4 mg, Intravenous, Q6H PRN, Shahmehdi, Seyed A, MD, 4 mg at 01/29/22 1150   Oral care mouth rinse, 15 mL, Mouth Rinse, PRN, Masters, Katie, DO   Oral care mouth rinse, 15 mL, Mouth Rinse, 4 times per day, Gonfa, Taye T, MD   Oral care mouth rinse, 15 mL, Mouth Rinse, PRN, GCyndia Skeeters Taye T, MD   oxyCODONE (Oxy IR/ROXICODONE) immediate release tablet 5 mg, 5 mg, Oral, Q6H PRN, Regalado, Belkys A, MD, 5 mg at 01/28/22 2311   pentafluoroprop-tetrafluoroeth (GEBAUERS) aerosol 1 Application, 1 Application, Topical, PRN, BRosita Fire MD   sodium chloride flush (NS) 0.9 % injection 10-40 mL, 10-40 mL, Intracatheter, Q12H, Masters, Katie, DO, 10 mL at 01/30/22 0955   sodium chloride flush (NS) 0.9 % injection 10-40 mL, 10-40 mL, Intracatheter, PRN, Masters, Katie, DO   TPN CYCLIC-ADULT (ION), ,  Intravenous, Cyclic-See Admin Instructions, Carolin Gu[facility], Stopped at 01/30/22 1330    Family History  Problem Relation Age of Onset   Arthritis Other    Heart disease Other    Cancer Other    Diabetes Other    OCD Other     Social History   Socioeconomic History   Marital status: Single    Spouse name: Not on file   Number of children: 2   Years of education: Not on file   Highest education level: Not on file  Occupational History   Not on file  Tobacco Use   Smoking status: Never   Smokeless tobacco: Never  Vaping Use   Vaping Use: Never used  Substance and Sexual Activity   Alcohol use: No   Drug use: No   Sexual activity: Not on file  Other Topics Concern   Not on file  Social History Narrative   Not on file   Social Determinants of Health   Financial Resource Strain: Low Risk  (11/28/2021)   Overall Financial Resource Strain (CARDIA)    Difficulty of Paying Living Expenses: Not hard at all  Food Insecurity: No Food  Insecurity (11/28/2021)   Hunger Vital Sign    Worried About Running Out of Food in the Last Year: Never true    Ward in the Last Year: Never true  Transportation Needs: No Transportation Needs (11/28/2021)   PRAPARE - Hydrologist (Medical): No    Lack of Transportation (Non-Medical): No  Physical Activity: Inactive (11/28/2021)   Exercise Vital Sign    Days of Exercise per Week: 0 days    Minutes of Exercise per Session: 0 min  Stress: Stress Concern Present (11/28/2021)   Josi    Feeling of Stress : To some extent  Social Connections: Socially Integrated (11/28/2021)   Social Connection and Isolation Panel [NHANES]    Frequency of Communication with Friends and Family: More than three times a week    Frequency of Social Gatherings with Friends and Family: More than three times a week    Attends Religious Services: More than 4 times per year    Active Member of [employer] or Organizations: Yes    Attends Music therapist: More than 4 times per year    Marital Status: Married    Review of Systems: A 12 point ROS discussed and pertinent positives are indicated in the HPI above.  All other systems are negative.  Review of Systems  Vital Signs: BP 122/72 (BP Location: Right Arm)   Pulse 74   Temp 98.1 F (36.7 C) (Oral)   Resp 20   Ht '5\' 4"'$  (1.626 m)   Wt 236 lb 8.9 oz (107.3 kg)   SpO2 96%   BMI 40.60 kg/m   Physical Exam Constitutional:      Appearance: She is ill-appearing. She is not toxic-appearing.  HENT:     Mouth/Throat:     Mouth: Mucous membranes are moist.     Pharynx: Oropharynx is clear.  Neck:     Comments: (R)IJ CVC intact (L)IJ Trialysis intact Cardiovascular:     Rate and Rhythm: Normal rate and regular rhythm.     Heart sounds: Normal heart sounds.  Neurological:     General: No focal deficit present.  Psychiatric:        Mood and  Affect: Mood normal.  Thought Content: Thought content normal.      Imaging: DG Abd 1 View  Result Date: 01/29/2022 CLINICAL DATA:  Feeding tube placement EXAM: ABDOMEN - 1 VIEW COMPARISON:  01/29/2022, CT 01/17/2022 FINDINGS: Two low resolution intraoperative spot views of the abdomen are submitted. Images were acquired during feeding tube placement. Total fluoroscopy time was 48 seconds, dose of 8.7 mGy. The images demonstrate tip of esophageal tube to overlie the stomach. Contrast injection opacifies the stomach and proximal duodenum. Patient is noted to have moderate hiatal hernia on CT IMPRESSION: Fluoroscopic assistance provided during feeding tube placement with tip of tube in the stomach. Electronically Signed   By: Donavan Foil M.D.   On: 01/29/2022 18:20   DG Abd Portable 1V  Result Date: 01/29/2022 CLINICAL DATA:  Feeding tube placement EXAM: PORTABLE ABDOMEN - 1 VIEW COMPARISON:  01/16/2022 FINDINGS: Nonobstructive pattern of included bowel gas. Non weighted enteric feeding tube is positioned with tip near the gastroesophageal junction. No obvious free air on supine radiograph. IMPRESSION: Non weighted enteric feeding tube is positioned with tip near the gastroesophageal junction. Recommend advancement to ensure subdiaphragmatic and post pyloric positioning. Electronically Signed   By: Delanna Ahmadi M.D.   On: 01/29/2022 11:21   DG Chest 2 View  Result Date: 01/22/2022 CLINICAL DATA:  Follow-up pleural effusion EXAM: CHEST - 2 VIEW COMPARISON:  Radiographs 01/20/2022 FINDINGS: Decreased size of the layering left pleural effusion since 01/20/2022. Remainder not substantially changed. Left-greater-than-right basilar airspace opacities. Bilateral interstitial opacities. No pneumothorax. Cardiomegaly. Aortic calcification. Right CVC tip in the right atrium.  Left IJ CVC tip in the low SVC. IMPRESSION: 1. Decreased size of the left pleural effusion. 2. Cardiomegaly and mild edema.  Electronically Signed   By: Placido Sou M.D.   On: 01/22/2022 08:02   DG CHEST PORT 1 VIEW  Result Date: 01/20/2022 CLINICAL DATA:  Pleural effusion EXAM: PORTABLE CHEST 1 VIEW COMPARISON:  January 18, 2022 FINDINGS: The right and left PICC lines are stable. No pneumothorax. Interstitial opacities remain in the lungs, mildly improved. Stable cardiomegaly. The hila and mediastinum are unchanged. There is a left-sided pleural effusion with underlying opacity. No other changes. IMPRESSION: 1. Cardiomegaly and mild edema.  The edema appears mildly improved. 2. Probable tiny right effusion. Moderate left effusion with underlying opacity, likely atelectasis. 3. No other change. Electronically Signed   By: Dorise Bullion III M.D.   On: 01/20/2022 08:26   DG CHEST PORT 1 VIEW  Result Date: 01/18/2022 CLINICAL DATA:  [id number]; status post thoracentesis EXAM: PORTABLE CHEST 1 VIEW COMPARISON:  Chest x-ray dated January 16, 2022 FINDINGS: Again seen is left transjugular central venous catheter with its tip at the upper-mid SVC region and the right IJ central venous catheter with its tip at the proximal right atrium, stable. Cardiomegaly. There has been interval right thoracentesis. No pneumothorax. Left pleural effusion. Pulmonary edema. Likely superimposed left basilar atelectasis. Moderate thoracic spondylosis. IMPRESSION: There has been interval right thoracentesis. No pneumothorax. Cardiomegaly, pulmonary edema and left pleural effusion. Electronically Signed   By: Frazier Richards M.D.   On: 01/18/2022 17:55   CT ABDOMEN PELVIS WO CONTRAST  Result Date: 01/17/2022 CLINICAL DATA:  Abdominal pain, post-op s/p ex lap with LOA, leukocytosis. Postop for small-bowel obstruction EXAM: CT ABDOMEN AND PELVIS WITHOUT CONTRAST TECHNIQUE: Multidetector CT imaging of the abdomen and pelvis was performed following the standard protocol without IV contrast. RADIATION DOSE REDUCTION: This exam was performed according to the  departmental dose-optimization program which  includes automated exposure control, adjustment of the mA and/or kV according to patient size and/or use of iterative reconstruction technique. COMPARISON:  01/09/2022 FINDINGS: Lower chest: Moderate to large bilateral pleural effusions, increasing since prior study. Compressive atelectasis in the lower lobes. Coronary artery and aortic calcifications. Small to moderate-sized hiatal hernia. Hepatobiliary: No focal liver abnormality is seen. Status post cholecystectomy. No biliary dilatation. Pancreas: No focal abnormality or ductal dilatation. Spleen: No focal abnormality.  Normal size. Adrenals/Urinary Tract: No adrenal abnormality. No focal renal abnormality. No stones or hydronephrosis. Urinary bladder is unremarkable. Stomach/Bowel: Oral contrast material seen within the distal small bowel and colon. Decreasing gaseous distension of small bowel loops. Continued mild gaseous distention of pelvic small bowel loops measuring up to 3.3 cm in diameter. Stomach and large bowel unremarkable. Vascular/Lymphatic: Scattered aortic atherosclerosis. No evidence of aneurysm or adenopathy. Reproductive: Prior hysterectomy.  No adnexal masses. Other: No free fluid or free air. Edema throughout the subcutaneous soft tissues of the abdominal and pelvic wall compatible with anasarca. Musculoskeletal: No acute bony abnormality. Postoperative changes in the lower lumbar spine. IMPRESSION: Decreasing gaseous distension of small bowel loops with improving bowel gas pattern. Continued mild distention of a few lower abdominal/pelvic small bowel loops measuring up to 3.3 cm. Oral contrast material has passed into the distal small bowel and colon. Moderate to large bilateral pleural effusions, increasing since prior study. Compressive atelectasis in the lower lobes. Small to moderate hiatal hernia. Aortic atherosclerosis. Anasarca like edema throughout the abdominal wall. Electronically Signed    By: Rolm Baptise M.D.   On: 01/17/2022 21:29   DG Abd 2 Views  Result Date: 01/17/2022 CLINICAL DATA:  Abdominal pain EXAM: ABDOMEN - 2 VIEW COMPARISON:  CT 01/09/2022 FINDINGS: Laparotomy skin staples are noted. Multiple gas-filled dilated loops of small bowel are again seen within the mid abdomen suggesting a postoperative ileus or persistent small bowel obstruction. No gas is seen within the large bowel. No gross free intraperitoneal gas. There is central clumping of bowel loops suggesting underlying ascites. Small bilateral pleural effusions are present. Central venous catheter tip noted within the low right atrium. Cholecystectomy clips in the right upper quadrant. IMPRESSION: 1. Persistent small bowel obstruction versus postoperative ileus. 2. Suspected ascites. 3. Small bilateral pleural effusions. Electronically Signed   By: Fidela Salisbury M.D.   On: 01/17/2022 00:13   CT HEAD WO CONTRAST (5MM)  Result Date: 01/16/2022 CLINICAL DATA:  Provided history: Altered mental status, nontraumatic. EXAM: CT HEAD WITHOUT CONTRAST TECHNIQUE: Contiguous axial images were obtained from the base of the skull through the vertex without intravenous contrast. RADIATION DOSE REDUCTION: This exam was performed according to the departmental dose-optimization program which includes automated exposure control, adjustment of the mA and/or kV according to patient size and/or use of iterative reconstruction technique. COMPARISON:  No pertinent prior exams available for comparison. FINDINGS: Brain: Mild generalized parenchymal atrophy. There is no acute intracranial hemorrhage. No demarcated cortical infarct. No extra-axial fluid collection. No evidence of an intracranial mass. No midline shift. Vascular: No hyperdense vessel. Atherosclerotic calcifications. Skull: No fracture or aggressive osseous lesion. Sinuses/Orbits: No mass or acute finding within the imaged orbits. 18 mm polyp versus mucous retention cyst, and minimal  background mucosal thickening, within the left maxillary sinus. 16 mm mucous retention cyst versus polyp within the left frontal sinus. Other: Small-volume fluid within the bilateral mastoid air cells. IMPRESSION: 1. No evidence of acute intracranial abnormality. 2. Mild generalized parenchymal atrophy. 3. Paranasal sinus disease, as described. 4.  Small bilateral mastoid effusions. Electronically Signed   By: Kellie Simmering D.O.   On: 01/16/2022 16:30   DG CHEST PORT 1 VIEW  Result Date: 01/16/2022 CLINICAL DATA:  Leukocytosis. EXAM: PORTABLE CHEST 1 VIEW COMPARISON:  Chest 01/12/2022 FINDINGS: Endotracheal tube removed.  NG removed. Left jugular dual lumen catheter tip in the SVC unchanged. Right sided central venous catheter extends into the right atrium with the tip not visualized but appears unchanged from the prior study Cardiac enlargement with vascular congestion and bilateral airspace disease most compatible with edema. Bilateral pleural effusions and bibasilar atelectasis/edema unchanged. No pneumothorax IMPRESSION: Endotracheal tube removed. Diffuse bilateral airspace disease and bilateral effusions most likely pulmonary edema unchanged. Underlying pneumonia not excluded. Electronically Signed   By: Franchot Gallo M.D.   On: 01/16/2022 15:35   ECHOCARDIOGRAM COMPLETE  Result Date: 01/14/2022    ECHOCARDIOGRAM REPORT   Patient Name:   TALISHIA Kindle Date of Exam: 01/14/2022 Medical Rec #:  [id number]    Height:       64.0 in Accession #:    [id number]   Weight:       199.3 lb Date of Birth:  Jul 14, 1945     BSA:          1.953 m Patient Age:    36 years     BP:           121/65 mmHg Patient Gender: F            HR:           98 bpm. Exam Location:  Inpatient Procedure: 2D Echo, Cardiac Doppler and Color Doppler Indications:    Ectopy  History:        Patient has prior history of Echocardiogram examinations, most                 recent 10/22/2017. COPD; Risk Factors:Hypertension and                  Dyslipidemia.  Sonographer:    Jefferey Pica Referring Phys: [id number] [place]  1. Left ventricular ejection fraction, by estimation, is 60 to 65%. The left ventricle has normal function. The left ventricle has no regional wall motion abnormalities. Left ventricular diastolic parameters were normal.  2. Right ventricular systolic function is normal. The right ventricular size is normal. There is normal pulmonary artery systolic pressure.  3. Left atrial size was mildly dilated.  4. Right atrial size was mildly dilated.  5. Calcified subchordal apparatus. The mitral valve is abnormal. Trivial mitral valve regurgitation. No evidence of mitral stenosis.  6. The aortic valve is tricuspid. There is mild calcification of the aortic valve. Aortic valve regurgitation is not visualized. Aortic valve sclerosis is present, with no evidence of aortic valve stenosis.  7. The inferior vena cava is normal in size with greater than 50% respiratory variability, suggesting right atrial pressure of 3 mmHg. FINDINGS  Left Ventricle: Left ventricular ejection fraction, by estimation, is 60 to 65%. The left ventricle has normal function. The left ventricle has no regional wall motion abnormalities. The left ventricular internal cavity size was normal in size. There is  no left ventricular hypertrophy. Left ventricular diastolic parameters were normal. Right Ventricle: The right ventricular size is normal. No increase in right ventricular wall thickness. Right ventricular systolic function is normal. There is normal pulmonary artery systolic pressure. The tricuspid regurgitant velocity is 2.61 m/s, and  with an assumed right atrial pressure of 5 mmHg, the  estimated right ventricular systolic pressure is 22.0 mmHg. Left Atrium: Left atrial size was mildly dilated. Right Atrium: Right atrial size was mildly dilated. Pericardium: There is no evidence of pericardial effusion. Mitral Valve: Calcified subchordal apparatus.  The mitral valve is abnormal. There is mild thickening of the mitral valve leaflet(s). There is mild calcification of the mitral valve leaflet(s). Trivial mitral valve regurgitation. No evidence of mitral valve stenosis. Tricuspid Valve: The tricuspid valve is normal in structure. Tricuspid valve regurgitation is mild . No evidence of tricuspid stenosis. Aortic Valve: The aortic valve is tricuspid. There is mild calcification of the aortic valve. Aortic valve regurgitation is not visualized. Aortic valve sclerosis is present, with no evidence of aortic valve stenosis. Aortic valve peak gradient measures 15.9 mmHg. Pulmonic Valve: The pulmonic valve was normal in structure. Pulmonic valve regurgitation is not visualized. No evidence of pulmonic stenosis. Aorta: The aortic root is normal in size and structure. Venous: The inferior vena cava is normal in size with greater than 50% respiratory variability, suggesting right atrial pressure of 3 mmHg. IAS/Shunts: No atrial level shunt detected by color flow Doppler.  LEFT VENTRICLE PLAX 2D LVIDd:         4.40 cm LVIDs:         3.00 cm LV PW:         0.90 cm LV IVS:        0.90 cm LVOT diam:     1.80 cm LV SV:         65 LV SV Index:   33 LVOT Area:     2.54 cm  RIGHT VENTRICLE             IVC RV S prime:     14.90 cm/s  IVC diam: 2.05 cm LEFT ATRIUM             Index        RIGHT ATRIUM           Index LA diam:        3.90 cm 2.00 cm/m   RA Area:     16.40 cm LA Vol (A2C):   62.6 ml 32.05 ml/m  RA Volume:   49.60 ml  25.39 ml/m LA Vol (A4C):   44.9 ml 22.99 ml/m LA Biplane Vol: 53.5 ml 27.39 ml/m  AORTIC VALVE                 PULMONIC VALVE AV Area (Vmax): 1.98 cm     PV Vmax:       1.23 m/s AV Vmax:        199.50 cm/s  PV Peak grad:  6.1 mmHg AV Peak Grad:   15.9 mmHg LVOT Vmax:      155.00 cm/s LVOT Vmean:     97.300 cm/s LVOT VTI:       0.257 m  AORTA Ao Root diam: 2.80 cm Ao Asc diam:  3.10 cm TRICUSPID VALVE TR Peak grad:   27.2 mmHg TR Vmax:        261.00  cm/s  SHUNTS Systemic VTI:  0.26 m Systemic Diam: 1.80 cm Jenkins Rouge MD Electronically signed by Jenkins Rouge MD Signature Date/Time: 01/14/2022/3:20:12 PM    Final    DG CHEST PORT 1 VIEW  Result Date: 01/12/2022 CLINICAL DATA:  Central line placement. EXAM: PORTABLE CHEST 1 VIEW COMPARISON:  Chest radiograph 01/10/2022 FINDINGS: New left internal jugular dialysis catheter tip overlies the upper SVC. No pneumothorax. The endotracheal tube tip is  now 2.8 cm from the carina. Right internal jugular central line and enteric tube remain in place. Unchanged bilateral pleural effusions and bilateral airspace disease. Stable cardiomegaly. IMPRESSION: 1. New left internal jugular dialysis catheter with tip overlying the upper SVC. No pneumothorax. 2. Endotracheal tube tip now 2.8 cm from the carina. Stable right central line and enteric tube. 3. Unchanged bilateral pleural effusions and bilateral airspace disease. Electronically Signed   By: Keith Rake M.D.   On: 01/12/2022 19:06   DG CHEST PORT 1 VIEW  Result Date: 01/10/2022 CLINICAL DATA:  Status post endotracheal tube placement. EXAM: PORTABLE CHEST 1 VIEW COMPARISON:  January 10, 2022 (1:03 p.m.) FINDINGS: An endotracheal tube is seen with its distal tip approximately 6.3 cm from the carina. Stable nasogastric tube and right internal jugular venous catheter positioning is noted. The heart size and mediastinal contours are within normal limits. Mild, stable patchy airspace disease is seen within the mid to upper right lung with predominant stable bilateral pleural effusions. No pneumothorax is identified. The visualized skeletal structures are unremarkable. IMPRESSION: 1. Endotracheal tube, nasogastric tube and right internal jugular venous catheter positioning, as described above. 2. Stable bilateral pleural effusions with stable mid to upper right lung airspace disease. Electronically Signed   By: Virgina Norfolk M.D.   On: 01/10/2022 23:52   DG  Chest Port 1V same Day  Result Date: 01/10/2022 CLINICAL DATA:  Line adjustment EXAM: PORTABLE CHEST 1 VIEW COMPARISON:  Same day chest radiograph at 1156 hours FINDINGS: Interval retraction of the endotracheal tube which is now within the midthoracic trachea at the level of the aortic knob approximally 4.5 cm from the carina. Right IJ central venous catheter with tip in the right atrium. Nasogastric tube courses below the diaphragm with tip obscured by collimation. The heart size and mediastinal contours unchanged. Aortic atherosclerosis. Small bilateral pleural effusions with adjacent airspace opacities. IMPRESSION: Interval retraction of the endotracheal tube which is now in the midthoracic trachea 4.5 cm from the carina. Electronically Signed   By: Dahlia Bailiff M.D.   On: 01/10/2022 13:24   DG Chest Port 1 View  Result Date: 01/10/2022 CLINICAL DATA:  Evaluate ET tube and NG tube placement. EXAM: PORTABLE CHEST 1 VIEW COMPARISON:  01/09/22 FINDINGS: The tip of the endotracheal tube is in the proximal right mainstem bronchus. Consider withdrawing by approximately 2 cm. The enteric tube tip and side port are below the GE junction. The enteric tube tip projects over the distal stomach. Right IJ catheter tip is in the inferior right atrium. No pneumothorax identified after catheter placement. There are small bilateral pleural effusions. The lung volumes are low and there is progressive atelectasis and/or consolidation involving the left lower lung. IMPRESSION: 1. ET tube tip is in the proximal right mainstem bronchus. Satisfactory position of the right IJ catheter and enteric tube. 2. Low lung volumes with progressive atelectasis and/or consolidation involving the left lower lung. 3. Small bilateral pleural effusions. These results will be called to the ordering clinician or representative by the Radiologist Assistant, and communication documented in the PACS or [employer]. Electronically Signed   By:  Kerby Moors M.D.   On: 01/10/2022 12:07   CT ABDOMEN PELVIS WO CONTRAST  Result Date: 01/09/2022 CLINICAL DATA:  Acute abdominal pain, concern for thickened bowel EXAM: CT ABDOMEN AND PELVIS WITHOUT CONTRAST TECHNIQUE: Multidetector CT imaging of the abdomen and pelvis was performed following the standard protocol without IV contrast. RADIATION DOSE REDUCTION: This exam was performed according  to the departmental dose-optimization program which includes automated exposure control, adjustment of the mA and/or kV according to patient size and/or use of iterative reconstruction technique. COMPARISON:  CT abdomen and pelvis 01/07/2022 FINDINGS: Lower chest: Small right-greater-than-left pleural effusions and associated atelectasis/consolidation. Patchy infiltrates in both lower lung fields. Mild bronchial wall thickening. Hepatobiliary: No focal liver abnormality is seen. Status post cholecystectomy. No biliary dilatation. Mixed pancreatic atrophy. No peripancreatic inflammatory stranding or fluid. Pancreas: Unremarkable. Spleen: Normal in size without focal abnormality. Adrenals/Urinary Tract: Unremarkable adrenal glands. No hydronephrosis or urinary calculi. Nondistended bladder. Stomach/Bowel: Moderate to large fixed hiatal hernia. The stomach is distended with fluid mixed with oral contrast. Fluid within the lower thoracic esophagus. Dilation of the duodenum and jejunum with abrupt transition point in the right lower quadrant (circa series 2/image 55 and series 5/image 55). The small bowel is dilated measuring up to 4.6 cm in diameter. Similar to increased diffuse small bowel wall thickening with engorgement of the mesenteric vasculature. Mesenteric edema and small amount of free fluid in the abdomen and pelvis. No definite pneumatosis. Vascular/Lymphatic: Mild aortic atherosclerotic calcification. No suspicious abdominopelvic lymphadenopathy. Reproductive: Status post hysterectomy. No adnexal masses. Other:  No free intraperitoneal air. Musculoskeletal: Posterior lumbar fusion at L4-S1. No acute osseous abnormality. Body wall anasarca. IMPRESSION: Abnormal dilation of the small bowel with transition point in the right lower quadrant compatible with high-grade bowel obstruction. The bowel wall is markedly thickened with engorged mesenteric vasculature. Evaluation for ischemia is limited without IV contrast. No free intraperitoneal air. No pneumatosis. Small right-greater-than-left pleural effusions and associated atelectasis/consolidation. Pneumonia is difficult to exclude. Moderate to large fixed hiatal hernia. Electronically Signed   By: Placido Sou M.D.   On: 01/09/2022 21:53   DG Chest 1 View  Result Date: 01/09/2022 CLINICAL DATA:  Check nasogastric catheter EXAM: PORTABLE CHEST 1 VIEW COMPARISON:  Film from earlier in the same day. FINDINGS: Gastric catheter has been advanced but remains coiled within the hiatal hernia. This should be withdrawn completely and readvanced in attempt to gain access to majority of the body of the stomach within the abdomen. Cardiac shadow is stable. Patchy airspace opacities are again seen stable from the prior exam. IMPRESSION: Gastric catheter coiled within hiatal hernia. This should be withdrawn and readvanced as described. Electronically Signed   By: Inez Catalina M.D.   On: 01/09/2022 03:18   DG CHEST PORT 1 VIEW  Result Date: 01/09/2022 CLINICAL DATA:  NG tube placement EXAM: PORTABLE CHEST 1 VIEW COMPARISON:  Radiographs 01/08/2022 FINDINGS: The enteric tube has been readjusted and is now looped in the lower chest with tip oriented superiorly likely within the intrathoracic portion of the stomach. Remainder unchanged. Hazy airspace opacities in the right lower lung. Left basilar atelectasis/consolidation. Prominent interstitial markings. Probable small right-greater-than-left pleural effusions. Stable cardiomediastinal silhouette. Aortic calcifications. IMPRESSION: 1.  The NG tube is coiled in the lower chest with tip oriented superiorly likely within the intrathoracic portion of the stomach. 2. Opacities in the lower lungs suspicious for pneumonia. 3. Probable small right-greater-than-left pleural effusions. These results will be called to the ordering clinician or representative by the Radiologist Assistant, and communication documented in the PACS or [employer]. Electronically Signed   By: Placido Sou M.D.   On: 01/09/2022 02:10   Acute Abdominal Series  Result Date: 01/08/2022 CLINICAL DATA: Nausea vomiting with abdominal pain. EXAM: DG ABDOMEN ACUTE WITH 1 VIEW CHEST COMPARISON:  CT scan 01/07/2022 FINDINGS: Low lung volumes. The cardio pericardial silhouette is  enlarged. Interstitial markings are diffusely coarsened with chronic features. Nodular and patchy opacities identified at the right base with associated retrocardiac atelectasis or infiltrate. NG tube tip is in the distal stomach. Diffuse gaseous distention of small bowel persists but appears decreased when comparing to scout image from yesterday's CT scan. Lumbar fusion hardware evident. Surgical clips in the right upper quadrant suggest prior cholecystectomy. IMPRESSION: 1. NG tube tip is in the distal stomach. 2. Diffuse gaseous distention of small bowel persists but appears decreased since yesterday's CT scan. 3. Nodular and patchy opacities at the right base with associated retrocardiac atelectasis or infiltrate. Given nodular appearance of the disease in the right base, CT chest recommended to exclude mass lesion. Electronically Signed   By: Misty Stanley M.D.   On: 01/08/2022 06:59   DG Abdomen 1 View  Result Date: 01/07/2022 CLINICAL DATA:  check NG tube placement EXAM: ABDOMEN - 1 VIEW COMPARISON:  CT from earlier the same day FINDINGS: Gastric tube has been advanced into the stomach. Dilated small bowel loops in the mid abdomen as before. Lumbosacral fixation hardware noted. IMPRESSION:  Gastric tube to the stomach Electronically Signed   By: Lucrezia Europe M.D.   On: 01/07/2022 17:39   CT ABDOMEN PELVIS WO CONTRAST  Result Date: 01/07/2022 CLINICAL DATA:  Abdominal pain, nausea, vomiting, diarrhea x5 days EXAM: CT ABDOMEN AND PELVIS WITHOUT CONTRAST TECHNIQUE: Multidetector CT imaging of the abdomen and pelvis was performed following the standard protocol without IV contrast. RADIATION DOSE REDUCTION: This exam was performed according to the departmental dose-optimization program which includes automated exposure control, adjustment of the mA and/or kV according to patient size and/or use of iterative reconstruction technique. COMPARISON:  01/04/2022 FINDINGS: Lower chest: Small patchy infiltrates are seen lower lung fields, more so in the right middle lobe. There is mild ectasia of bronchi. There is minimal right pleural effusion. Hepatobiliary: No focal abnormalities are seen in liver. There is no dilation of bile ducts. Surgical clips are seen in gallbladder fossa. Pancreas: There is atrophy.  No focal abnormalities are seen. Spleen: Unremarkable. Adrenals/Urinary Tract: Adrenals are unremarkable. There is no hydronephrosis. There are no renal or ureteral stones. Urinary bladder is not distended. High density in the lumen of the bladder may suggest residual contrast from previous CT. Stomach/Bowel: There is moderate to large fixed hiatal hernia. There is distention of stomach with fluid. There is fluid in the lower thoracic esophagus. There is interval worsening of proximal measuring up to 4.4 cm in diameter. Distal small bowel loops are decompressed. Exact level of transition is difficult to identify. It is possibly in right lower quadrant or right side of pelvis. Colon is not distended. Vascular/Lymphatic: Scattered arterial calcifications are seen. Reproductive: Uterus is not seen. Other: Small amount of ascites is present. There is no pneumoperitoneum. Musculoskeletal: There is lumbar fusion  at L4-L5 and L5-S1 levels. IMPRESSION: There is abnormal dilation of proximal small bowel loops with decompression of distal small bowel loops consistent with high-grade partial small bowel obstruction, possibly in right lower quadrant or right pelvic cavity. This may be caused by adhesions or internal hernia or neoplastic process. There is significant interval worsening of small bowel dilation since 01/04/2022. There is small ascites. There is no pneumoperitoneum. Moderate to large fixed hiatal hernia. There is fluid in the lumen of stomach within the hiatal hernia and fluid in the lumen of lower thoracic esophagus, most likely due to high-grade small bowel obstruction. There is no hydronephrosis. There are patchy infiltrates  in lower lung fields, more so in right middle lobe suggesting atelectasis/pneumonia. There is minimal right pleural effusion. Electronically Signed   By: Elmer Picker M.D.   On: 01/07/2022 16:02   CT Abdomen Pelvis W Contrast  Result Date: 01/04/2022 CLINICAL DATA:  Acute abdominal pain. EXAM: CT ABDOMEN AND PELVIS WITH CONTRAST TECHNIQUE: Multidetector CT imaging of the abdomen and pelvis was performed using the standard protocol following bolus administration of intravenous contrast. RADIATION DOSE REDUCTION: This exam was performed according to the departmental dose-optimization program which includes automated exposure control, adjustment of the mA and/or kV according to patient size and/or use of iterative reconstruction technique. CONTRAST:  115m OMNIPAQUE IOHEXOL 300 MG/ML  SOLN COMPARISON:  None Available. FINDINGS: Lower chest: No acute abnormality. Hepatobiliary: No focal liver abnormality is seen. Status post cholecystectomy. No biliary dilatation. Pancreas: Unremarkable. No pancreatic ductal dilatation or surrounding inflammatory changes. Spleen: Normal in size without focal abnormality. Adrenals/Urinary Tract: Adrenal glands are unremarkable. Kidneys are normal,  without renal calculi, focal lesion, or hydronephrosis. Bladder is unremarkable. Stomach/Bowel: Small bowel loops in the right lower quadrant demonstrate wall thickening with mesenteric edema, but are nondilated. There is no pneumatosis. Small bowel loops proximal to this level are distended and filled with fluid. The colon is nondilated. The appendix is not seen. Moderate-sized hiatal hernia is present. The stomach is otherwise within normal limits. Vascular/Lymphatic: Aortic atherosclerosis. No enlarged abdominal or pelvic lymph nodes. Reproductive: Status post hysterectomy. No adnexal masses. Other: There is a small amount of free fluid throughout the abdomen and pelvis. No free intraperitoneal air. No focal abdominal wall hernia. Musculoskeletal: L4-S1 posterior fusion hardware is present. IMPRESSION: 1. Small bowel loops in the right lower quadrant with wall thickening and mesenteric edema worrisome for nonspecific colitis. Findings may be infectious, inflammatory or ischemic. There is no bowel obstruction, pneumatosis or free air. 2. Small volume ascites throughout the abdomen and pelvis. 3.  Aortic Atherosclerosis (ICD10-I70.0). 4. Moderate-sized hiatal hernia. Electronically Signed   By: ARonney AstersM.D.   On: 01/04/2022 19:35    Labs:  CBC: Recent Labs    01/24/22 1504 01/25/22 0548 01/26/22 0306 01/27/22 0536 01/28/22 0426  WBC 10.9* 9.1 8.4 7.6  --   HGB 7.6* 7.2* 7.2* 8.5* 8.6*  HCT 24.7* 22.5* 22.7* 27.4* 27.3*  PLT 560* 541* 526* 519*  --     COAGS: Recent Labs    01/13/22 0448 01/14/22 0400  APTT 50* 197*    BMP: Recent Labs    01/27/22 0536 01/28/22 0426 01/29/22 0607 01/30/22 0852  NA 140 137 140 139  K 3.8 3.7 4.2 3.9  CL 99 101 101 104  CO2 '29 29 29 29  '$ GLUCOSE 113* 116* 104* 119*  BUN 41* 45* 51* 60*  CALCIUM 8.4* 8.2* 8.5* 8.2*  CREATININE 1.72* 2.00* 2.20* 2.43*  GFRNONAA 31* 26* 23* 20*    LIVER FUNCTION TESTS: Recent Labs    01/16/22 0627  01/21/22 0645 01/22/22 0814 01/24/22 0500 01/24/22 1504 01/27/22 0536 01/28/22 0426 01/29/22 0607 01/30/22 0852  BILITOT 0.6 0.3  --  0.6  --   --  0.6  --   --   AST 25 16  --  15  --   --  16  --   --   ALT 17 16  --  15  --   --  17  --   --   ALKPHOS 128* 93  --  88  --   --  113  --   --  PROT 4.1* 4.7*  --  5.1*  --   --  5.5*  --   --   ALBUMIN 1.8* 1.7*   < > 1.8*   < > 1.8* 1.9* 1.9* 1.7*   < > = values in this interval not displayed.     Assessment and Plan: Renal failure in need extended duration of hemodialysis Plan for tunneled HD catheter placement tomorrow. Labs reviewed, afebrile. Will stop TF and NPO at [place] Risks and benefits of image guided hemodialysis catheter placement was discussed with the patient including, but not limited to bleeding, infection, pneumothorax, or fibrin sheath development and need for additional procedures.  All of the patient's questions were answered, patient is agreeable to proceed. Consent signed and in chart.    Electronically Signed: Ascencion Dike, PA-C 01/30/2022, 3:30 PM   I spent a total of 20 in face to face in clinical consultation, greater than 50% of which was counseling/coordinating care for HD catheter

## 2022-01-30 NOTE — Progress Notes (Signed)
Palliative Medicine Inpatient Follow Up Note HPI: [name]  is a 76 yo female with a PMHx: COPD, HLD, HTN, PE, hypothyroidism, OA and depression. Admitted 8/14 to [facility] with vomiting and diarrhea with partial SBO. 8/17 intubated and ex lap. 8/19 pt with worsening renal function, transfer to [facility] and CRRT started. 8/21 extubation with CRRT off.  Palliative care has been asked to get involved to have goals of care conversations in the setting of a prolonged and complicated hospitalization.  Today's Discussion 01/30/2022  *Please note that this is a verbal dictation therefore any spelling or grammatical errors are due to the "[employer]" system interpretation.  Chart reviewed inclusive of vital signs, progress notes, laboratory results, and diagnostic images. PO's remain overall quite poor. Outside food is encouraged.   I met with [name] at bedside this afternoon, she was joined by her son, [name]. Discussed patients present state. She expresses some "fullness" and often nausea after a few bites of food which is what is leading to her overall poor PO intake. Discussed considering low dose antiemetics 30 minutes prior to meals.   We reviewed the reason for coretrack placement. I shared that it is important for [name] and [name] to consider in the future if she would desire a long term feeding tube or not. She expresses understanding.   Patient vocalizes continued improvement in her BLE swelling. [name] shares that [name] took a few steps yesterday which was very impressive. Continue with goals of improvement.   Discussed the plan for placement at [facility] once medically optimized. Reviewed that the primary medical team would deem when this was appropriate per conversation with son.   Palliative support provided to the patient.  Objective Assessment: Vital Signs Vitals:   01/30/22 0358 01/30/22 1415  BP: 129/85 122/72  Pulse: 71 76  Resp: 15 18  Temp: 97.9 F (36.6 C) 98.1 F  (36.7 C)  SpO2: 96% 97%    Intake/Output Summary (Last 24 hours) at 01/30/2022 1424 Last data filed at 01/30/2022 0800 Gross per 24 hour  Intake 1293.37 ml  Output 375 ml  Net 918.37 ml    Last Weight  Most recent update: 01/30/2022  3:59 AM    Weight  107.3 kg (236 lb 8.9 oz)            Gen: Elderly Caucasian female HEENT: moist mucous membranes CV: Regular rate and rhythm  PULM:  On 2LPM Felida, breathing is even and nonlabored ABD: soft/nontender  EXT: (+) LE edema  Neuro: Alert and oriented x2-3  SUMMARY OF RECOMMENDATIONS   DNAR/DNI    Watchful waiting --> For improvements  Generalized weakness --> PT/OT, Needs daily mobility. Has lymphedema machine which has helped with swelling and allowed mor mobility  Anxiety --> Buspar stopped in the setting of AKI, on alprazolam Q6H PRN  FTT --> Dietary is involved and patient had a coretrack placed today   Patient's goals are for improvement   TOC - OP Palliative support on discharge --> Will transition to [facility] once optimized   Incremental palliative care support  Billing based on MDM: High ______________________________________________________________________________________ [name] Palliative Medicine Team Team Cell Phone: [phone] Please utilize secure chat with additional questions, if there is no response within 30 minutes please call the above phone number  Palliative Medicine Team providers are available by phone from 7am to 7pm daily and can be reached through the team cell phone.  Should this patient require assistance outside of these  hours, please call the patient's attending physician.

## 2022-01-30 NOTE — Progress Notes (Signed)
Nutrition Brief Note   Pt with Cortrak placement yesterday; tube requiring advancement by fluoroscopy. This RD assessed tube and placement is at 52 cm. X-ray confirming placement in stomach. Plan to start enteral nutrition via Cortrak today.  Tube Feeds via Cortrak: - Start Osmolite 1.5 at 25 mL/hr and advance by 10 mL every 8 hour to goal rate of 45 mL/hr. (1080 mL/day) - 60 mL ProSource TF20 - BID - Provides 1780 kcal, 108 gm of protein, and 823 mL total free water daily   RD to continue to follow.   Hermina Barters RD, LDN Clinical Dietitian See Shea Evans for contact information.

## 2022-01-30 NOTE — Progress Notes (Signed)
Patient off floor to dialysis

## 2022-01-30 NOTE — Progress Notes (Addendum)
PHARMACY - TOTAL PARENTERAL NUTRITION CONSULT NOTE   Indication: Prolonged ileus  Patient Measurements: Height: '5\' 4"'$  (162.6 cm) Weight: 107.3 kg (236 lb 8.9 oz) IBW/kg (Calculated) : 54.7 TPN AdjBW (KG): 67.8 Body mass index is 40.6 kg/m.  Assessment: 76 yo female with septic shock, high-grade small bowel obstruction. Underwent surgery on 8/17 with ex lap and lysis of adhesions for SBO. Reported vomiting and diarrhea since 8/11 up until surgery on 8/17. Pharmacy consulted for TPN due to prolonged ileus.   Glucose / Insulin: no hx DM - CBGs < 140, off SSI Electrolytes:  CoCa 10, others wnl Renal: 8/19-8/21 CRRT >> 8/23 HD (last 9/2, hold 9/5 per nephro), Scr 2.43, BUN 60 up Hepatic: AST / ALT / tbili /TG wnl, albumin 1.7 Intake / Output; MIVF: UOP 361m (despite lasix 60 IVx1). LBM 9/6 GI Imaging:  8/16 KUB: high grade bowel obstruction  8/24 CT abd: decr gas distention, anasarca throughout abd wall, distention of lower abdominal/pelvic small bowel loops  8/24 KUB: persistent SBO vs. post-op ileus, suspected ascites   9/6 ab xray: confirm cortrak placement  GI Surgeries / Procedures:  8/17 with ex lap and LOA for SBO  Central access: triple lumen CVC placed 01/10/22 TPN start date: 01/14/22  Nutritional Goals:  RD Estimated Needs Total Energy Estimated Needs: 1700-1900 Total Protein Estimated Needs: 100-120 gm Total Fluid Estimated Needs: 1L + UOP  Current Nutrition:  TPN + cortrak (without TF orders yet)  Diet: 9/2: Encouraging po intake as much as possible - pt taking some of Nepro shakes, cream of wheat, jello, [place]. 9/3: Po intake remains poor - cream of wheat, 1/2 Nephro, few bites of peach cobbler 9/4: Pt still reports not feeling hungry and losing appetite - she says she is trying. Eating 10-15% of meals, same as yesterday basically. 9/5 planning to start TF  9/6: Pt has Cortrak placed and ab xray confirms placement into tip of stomach. No TF orders. Back to  full TPN rate  Plan:  Continue cycle concentrated TPN at full goal (rate 41-82 ml/hr, GIR 1.05-2.1 mg/kg/min) which provides 110 g AA and 1745 Kcal, meeting ~100% estimated needs with goal to wean. Electrolytes in TPN: Remove K 0 mEq/L, Ca 0 mEq/L, Phos 0 mmol/L; Continue Na 150 mEq/L (Max),  Decrease Mg 3 mEq/L with advance to full, CHB:[id number]:1.  Continue MVI and trace elements enterally, none in TPN Monitor TPN labs on Mon/Thurs F/u PO intake/diet advancement, enteral feed tolerance/bloating F/u nephrology plans - possible placement of tunneled dialysis catheter.  MWilson Singer PharmD Clinical Pharmacist 01/30/2022 11:03 AM

## 2022-01-30 NOTE — Progress Notes (Signed)
[place] for Heparin Indication: atrial fibrillation  Allergies  Allergen Reactions   Celebrex [Celecoxib] Swelling   Keflet [Cephalexin] Swelling   Penicillins Hives and Swelling    DID THE REACTION INVOLVE: Swelling of the face/tongue/throat, SOB, or low BP? Yes-swelling-hives Sudden or severe rash/hives, skin peeling, or the inside of the mouth or nose? Unknown Did it require medical treatment? Unknown When did it last happen?    over 10 years   If all above answers are "NO", may proceed with cephalosporin use.    Sulfa Antibiotics Swelling   Kenalog [Triamcinolone Acetonide]     unknown   Latex    Lisinopril Other (See Comments)    unknown   Mobic [Meloxicam]     SWELLING    Patient Measurements: Height: '5\' 4"'$  (162.6 cm) Weight: 107.3 kg (236 lb 8.9 oz) IBW/kg (Calculated) : 54.7  Heparin Dosing Weight: 72 kg  Vital Signs: Temp: 97.9 F (36.6 C) (09/06 0358) Temp Source: Oral (09/06 0358) BP: 129/85 (09/06 0358) Pulse Rate: 71 (09/06 0358)  Labs: Recent Labs    01/28/22 0426 01/29/22 0607 01/30/22 0443  HGB 8.6*  --   --   HCT 27.3*  --   --   HEPARINUNFRC 0.31 0.42 0.45  CREATININE 2.00* 2.20*  --     Estimated Creatinine Clearance: 26.4 mL/min (A) (by C-G formula based on SCr of 2.2 mg/dL (H)).   Assessment: Pharmacy consulted to dose heparin infusion for post-op afib. Patient was not on any AC PTA.  She is noted s/p ex lap and on TPN.  Cortrack placed 9/5 and plans to start tube feed soon  Heparin level therapeutic at 0.45 on 1650 units/hr. Hgb 8.6  Goal of Therapy:  Heparin level 0.3-0.7 units/ml Monitor platelets by anticoagulation protocol: Yes   Plan:  Continue heparin infusion at 1650 units/hr Check daily heparin level and CBC  Hildred Laser, PharmD Clinical Pharmacist **Pharmacist phone directory can now be found on [email or web address] ([email or web address]).  Listed under [place].

## 2022-01-30 NOTE — Care Management (Signed)
01-30-22 1622 Case Manager is awaiting to hear back from nephrology and triad hospitalist for disposition plan of care. [facility] is following the patient to see if she will need outpatient HD. [facility] has one HD bed at this time. Case Manager will continue to follow for additional transition of care needs.

## 2022-01-30 NOTE — Telephone Encounter (Signed)
Pharmacy Patient Advocate Encounter  Insurance verification completed.    The patient is insured through [facility]   The patient is currently admitted and ran test claims for the following: Eliquis.  Copays and coinsurance results were relayed to Inpatient clinical team.

## 2022-01-30 NOTE — TOC Benefit Eligibility Note (Signed)
Patient Teacher, English as a foreign language completed.    The patient is currently admitted and upon discharge could be taking Eliquis 5 mg.  The current 30 day co-pay is $141.92.   The patient is insured through [place], [place] Patient Advocate Specialist [place] Patient Advocate Team Direct Number: [phone]  Fax: [phone]

## 2022-01-30 NOTE — Progress Notes (Signed)
PROGRESS NOTE  [name] ZOX:[id number] DOB: 01-Nov-1945   PCP: Chevis Pretty, FNP  Patient is from: Home  DOA: 01/07/2022 LOS: 65  Chief complaints Chief Complaint  Patient presents with   Abdominal Pain     Brief Narrative / Interim history: 76 year old F with PMH of PE, HTN, HLD, hypothyroidism, osteoarthritis and depression returned to [facility] ED with nausea, vomiting and diarrhea and found to small bowel obstruction.  She then developed AKI and septic shock and underwent emergent ex lap with lysis of adhesion on 8/17.  Postoperatively, she remained intubated, started on CRRT and transferred to [facility] on 8/19.  Eventually extubated on 8/21, and started on TNA.  She is started intermittent HD on 8/23.   Hospital course complicated by A-fib with RVR and diarrhea.  Cardiology consulted and she was started on IV amiodarone for A-fib with RVR.  In regards to diarrhea, C. difficile negative.  GIP positive for STEC.  Diarrhea improving.  Patient had repeat CT abdomen as part of evaluation for worsening abdominal pain that was negative for abscess but moderate bilateral pleural effusion.  She underwent right thoracocentesis with removal of 1.1 L on 8/25.  General surgery and nephrology following.  SLP recommended dysphagia 3 diet after FEES on 8/30.  TPN at half rate.  However, p.o. intake remains poor.  IR placed cortrack after unsuccessful attempt by RD on 9/5.  Tube feeding started on 9/6.    Disposition likely SNF/LTACH once cleared by nephrology and general surgery.  Still receiving dialysis intermittently.  Subjective: Seen and examined earlier this morning.  No major events overnight of this morning.  Some discomfort across lower abdomen.  She has no UTI symptoms.  Objective: Vitals:   01/29/22 0600 01/29/22 1713 01/29/22 1947 01/30/22 0358  BP: 132/74 97/63 115/74 129/85  Pulse: 73 76 74 71  Resp: '18 13 16 15  '$ Temp: 98 F (36.7 C)  97.9 F (36.6 C) 97.9 F  (36.6 C)  TempSrc: Oral  Oral Oral  SpO2: 96% 97% 98% 96%  Weight: 106.3 kg   107.3 kg  Height:        Examination:  GENERAL: No apparent distress.  Nontoxic. HEENT: MMM.  Vision and hearing grossly intact.  NECK: Supple.  No apparent JVD.  RESP:  No IWOB.  Fair aeration bilaterally. CVS:  RRR. Heart sounds normal.  ABD/GI/GU: BS+. Abd soft, NTND.  MSK/EXT:  Moves extremities. No apparent deformity.  Trace BLE edema.  Ace wrap over LUE. SKIN: no apparent skin lesion or wound NEURO: Awake and alert. Oriented appropriately.  No apparent focal neuro deficit. PSYCH: Calm. Normal affect.   Procedures:  8/17-laparotomy with lysis of adhesion   Microbiology summarized: 8/14-urine culture with multiple species 8/17-MRSA PCR screen negative 8/17-blood cultures NGTD 8/19-MRSA PCR screen negative 8/26-C. difficile PCR negative 8/25-GIP with STEC   Assessment and plan: Principal Problem:   Septic shock ([place]) Active Problems:   Respiratory failure requiring intubation (HCC)   Small bowel obstruction (HCC)   COPD (chronic obstructive pulmonary disease) ([place])   Hyperlipidemia   Hypothyroidism (acquired)   Essential hypertension   History of pulmonary embolus (PE)   AKI (acute kidney injury) ([place])   Acute respiratory failure with hypoxia and hypercapnia (HCC)   Pressure injury of skin   Malnutrition of moderate degree  Septic shock in the setting of mall bowel obstruction and intra-abdominal infection/STEC: Resolved. -Completed antibiotic course with aztreonam and metronidazole on 8/27 -Repeat CT on 8/24 negative for abscess  and decreased bowel distention  SBO status post laparotomy 8/17 Dysphagia-SLP recommended dysphagia 3 diet. Decreased oral intake -IR placed cortrack after unsuccessful attempt by RD on 9/5.  Tube feeding started on 9/6.   -We will discontinue TPN once she tolerate tube feed.   Acute Metabolic Encephalopathy: In the setting of septic shock and SBO:  Resolved.  Oriented x4. -Reorientation and delirium precaution   Paroxysmal A-fib with RVR/PVCs: Currently rate controlled. -Continue metoprolol and amiodarone -On IV heparin for anticoagulation.  Transition to p.o. agent prior to discharge -Cardiology signed off.   Acute hypoxic respiratory failure/pulmonary edema/bilateral pleural effusion: Improving. -S/p right thoracocentesis on 8/25 by PCCM -Fluid management by HD per nephrology -Asked RN to wean off oxygen.  Encourage incentive telemetry  AKI/azotemia: Likely from ATN in the setting of sepsis.  I and O difficult to monitor due to incontinence Recent Labs    01/22/22 0814 01/23/22 8127 01/24/22 0500 01/24/22 1504 01/25/22 0548 01/26/22 0306 01/27/22 0536 01/28/22 0426 01/29/22 0607 01/30/22 0852  BUN 59* 69* 78* 80* 47* 52* 41* 45* 51* 60*  CREATININE 2.14* 2.48* 2.74* 2.71* 1.94* 2.16* 1.72* 2.00* 2.20* 2.43*  -CRRT discontinued on 8/21.  IHD started on 8/23. -Nephrology following-recommends IHD as needed -Monitor intake and output -Avoid nephrotoxic meds   Infectious diarrhea/gastroenteritis due to STEC: Diarrhea resolved.   Iron deficiency anemia superimposed on anemia of chronic disease: Transfused on 8/21 and 9/2. Recent Labs    01/20/22 0343 01/21/22 0645 01/22/22 0429 01/23/22 0637 01/24/22 0500 01/24/22 1504 01/25/22 0548 01/26/22 0306 01/27/22 0536 01/28/22 0426  HGB 7.2* 7.3* 7.1* 7.4* 7.6* 7.6* 7.2* 7.2* 8.5* 8.6*  -Continue monitoring   Hyponatremia, hypokalemia, hypophosphatemia: Resolved. -Monitor and correct as appropriate   Leukocytosis: Likely demargination.  Resolved.  Chronic LUE lymphedema: -Ace wrap per PT.  Morbid obesity Body mass index is 40.6 kg/m.  Moderate malnutrition Nutrition Problem: Moderate Malnutrition Etiology: acute illness (SBO) Signs/Symptoms: energy intake < 75% for > 7 days, mild muscle depletion, moderate fat depletion Interventions: MVI, TPN, Nepro  shake, Tube feeding  Pressure skin injury: POA Pressure Injury 01/10/22 Coccyx Medial;Mid Stage 2 -  Partial thickness loss of dermis presenting as a shallow open injury with a red, pink wound bed without slough. pink (Active)  01/10/22 1200  Location: Coccyx  Location Orientation: Medial;Mid  Staging: Stage 2 -  Partial thickness loss of dermis presenting as a shallow open injury with a red, pink wound bed without slough.  Wound Description (Comments): pink  Present on Admission:   Dressing Type Foam - Lift dressing to assess site every shift 01/30/22 0800     Pressure Injury 01/12/22 Face Left Stage 2 -  Partial thickness loss of dermis presenting as a shallow open injury with a red, pink wound bed without slough. red ulceration beneath ETT holder on L cheek (Active)  01/12/22 1815  Location: Face  Location Orientation: Left  Staging: Stage 2 -  Partial thickness loss of dermis presenting as a shallow open injury with a red, pink wound bed without slough.  Wound Description (Comments): red ulceration beneath ETT holder on L cheek  Present on Admission: Yes     Pressure Injury 01/16/22 Buttocks Left (Active)  01/16/22 1230  Location: Buttocks  Location Orientation: Left  Staging:   Wound Description (Comments):   Present on Admission: Yes  Dressing Type Foam - Lift dressing to assess site every shift 01/30/22 0800   DVT prophylaxis:  Place and maintain sequential compression device Start:  01/20/22 0901  Code Status: DNR/DNI Family Communication: Updated patient's son at bedside on 9/5 Level of care: Telemetry Medical Status is: Inpatient Remains inpatient appropriate because: Acute kidney failure requiring dialysis, poor p.o. intake   Final disposition: SNF or LTACH Consultants:  Pulmonology Nephrology Cardiology  Sch Meds:  Scheduled Meds:  sodium chloride   Intravenous Once   amiodarone  200 mg Oral Daily   Chlorhexidine Gluconate Cloth  6 each Topical Q0600    Chlorhexidine Gluconate Cloth  6 each Topical Q0600   darbepoetin (ARANESP) injection - NON-DIALYSIS  60 mcg Subcutaneous Q Tue-1800   escitalopram  10 mg Oral Daily   feeding supplement (NEPRO CARB STEADY)  237 mL Oral BID BM   feeding supplement (PROSource TF20)  60 mL Per Tube BID   ferrous sulfate  325 mg Oral Q breakfast   folic acid  1 mg Oral Daily   guaiFENesin  600 mg Oral BID   metoprolol tartrate  12.5 mg Oral BID   multivitamin with minerals  1 tablet Oral Daily   mouth rinse  15 mL Mouth Rinse 4 times per day   sodium chloride flush  10-40 mL Intracatheter Q12H   Continuous Infusions:  sodium chloride Stopped (01/15/22 1255)   anticoagulant sodium citrate     feeding supplement (OSMOLITE 1.5 CAL)     heparin 1,650 Units/hr (54/00/86 7619)   TPN CYCLIC-ADULT (ION) Stopped (01/30/22 1330)   PRN Meds:.acetaminophen, albuterol, ALPRAZolam, alteplase, alteplase, anticoagulant sodium citrate, guaiFENesin-dextromethorphan, heparin, lidocaine (PF), lidocaine-prilocaine, lip balm, loperamide, [DISCONTINUED] ondansetron **OR** ondansetron (ZOFRAN) IV, mouth rinse, mouth rinse, oxyCODONE, pentafluoroprop-tetrafluoroeth, sodium chloride flush  Antimicrobials: Anti-infectives (From admission, onward)    Start     Dose/Rate Route Frequency Ordered Stop   01/16/22 2000  aztreonam (AZACTAM) 1 g in sodium chloride 0.9 % 100 mL IVPB  Status:  Discontinued        1 g 200 mL/hr over 30 Minutes Intravenous Every 8 hours 01/16/22 1840 01/20/22 1254   01/16/22 2000  metroNIDAZOLE (FLAGYL) IVPB 500 mg  Status:  Discontinued        500 mg 100 mL/hr over 60 Minutes Intravenous Every 8 hours 01/16/22 1840 01/20/22 1254   01/15/22 0526  aztreonam (AZACTAM) 1 g in sodium chloride 0.9 % 100 mL IVPB  Status:  Discontinued       See Hyperspace for full Linked Orders Report.   1 g 200 mL/hr over 30 Minutes Intravenous Every 24 hours 01/14/22 1529 01/14/22 1534   01/15/22 0526  aztreonam (AZACTAM) 1 g  in sodium chloride 0.9 % 100 mL IVPB       See Hyperspace for full Linked Orders Report.   1 g 200 mL/hr over 30 Minutes Intravenous Every 24 hours 01/14/22 1534 01/15/22 0504   01/13/22 2100  vancomycin (VANCOCIN) IVPB 1000 mg/200 mL premix  Status:  Discontinued        1,000 mg 200 mL/hr over 60 Minutes Intravenous Every 24 hours 01/12/22 2135 01/13/22 1027   01/13/22 0500  aztreonam (AZACTAM) 2 g in sodium chloride 0.9 % 100 mL IVPB  Status:  Discontinued       See Hyperspace for full Linked Orders Report.   2 g 200 mL/hr over 30 Minutes Intravenous Every 12 hours 01/12/22 2135 01/14/22 1529   01/12/22 2015  vancomycin (VANCOREADY) IVPB 1750 mg/350 mL        1,750 mg 175 mL/hr over 120 Minutes Intravenous  Once 01/12/22 1921 01/12/22 2259   01/11/22  1800  aztreonam (AZACTAM) 1 g in sodium chloride 0.9 % 100 mL IVPB  Status:  Discontinued       See Hyperspace for full Linked Orders Report.   1 g 200 mL/hr over 30 Minutes Intravenous Every 8 hours 01/11/22 0815 01/12/22 2135   01/11/22 1000  levofloxacin (LEVAQUIN) IVPB 500 mg  Status:  Discontinued        500 mg 100 mL/hr over 60 Minutes Intravenous Every 48 hours 01/10/22 1432 01/11/22 0744   01/11/22 0915  aztreonam (AZACTAM) 2 g in sodium chloride 0.9 % 100 mL IVPB       See Hyperspace for full Linked Orders Report.   2 g 200 mL/hr over 30 Minutes Intravenous  Once 01/11/22 0815 01/11/22 1045   01/11/22 0845  aztreonam (AZACTAM) 2 g in sodium chloride 0.9 % 100 mL IVPB  Status:  Discontinued        2 g 200 mL/hr over 30 Minutes Intravenous  Once 01/11/22 0747 01/11/22 0815   01/11/22 0845  metroNIDAZOLE (FLAGYL) IVPB 500 mg  Status:  Discontinued        500 mg 100 mL/hr over 60 Minutes Intravenous Every 12 hours 01/11/22 0747 01/15/22 0851   01/10/22 1900  levofloxacin (LEVAQUIN) IVPB 500 mg  Status:  Discontinued        500 mg 100 mL/hr over 60 Minutes Intravenous Every 24 hours 01/10/22 1426 01/10/22 1432   01/10/22 0700   ciprofloxacin (CIPRO) IVPB 400 mg        400 mg 200 mL/hr over 60 Minutes Intravenous On call to O.R. 01/10/22 0612 01/10/22 1052   01/10/22 0700  metroNIDAZOLE (FLAGYL) IVPB 500 mg  Status:  Discontinued        500 mg 100 mL/hr over 60 Minutes Intravenous On call to O.R. 01/10/22 0612 01/10/22 1123   01/08/22 0830  Levofloxacin (LEVAQUIN) IVPB 250 mg  Status:  Discontinued        250 mg 50 mL/hr over 60 Minutes Intravenous Every 24 hours 01/08/22 0724 01/08/22 0727   01/08/22 0830  Levofloxacin (LEVAQUIN) IVPB 250 mg  Status:  Discontinued        250 mg 50 mL/hr over 60 Minutes Intravenous Every 48 hours 01/08/22 0727 01/08/22 0732   01/08/22 0830  Levofloxacin (LEVAQUIN) IVPB 250 mg  Status:  Discontinued        250 mg 50 mL/hr over 60 Minutes Intravenous Every 24 hours 01/08/22 0732 01/10/22 1426        I have personally reviewed the following labs and images: CBC: Recent Labs  Lab 01/24/22 0500 01/24/22 1504 01/25/22 0548 01/26/22 0306 01/27/22 0536 01/28/22 0426  WBC 11.2* 10.9* 9.1 8.4 7.6  --   HGB 7.6* 7.6* 7.2* 7.2* 8.5* 8.6*  HCT 23.8* 24.7* 22.5* 22.7* 27.4* 27.3*  MCV 89.1 90.5 89.6 90.4 89.5  --   PLT 532* 560* 541* 526* 519*  --    BMP &GFR Recent Labs  Lab 01/24/22 0500 01/24/22 1504 01/25/22 0548 01/26/22 0306 01/27/22 0536 01/28/22 0426 01/29/22 0607 01/30/22 0852  NA 135 136 136 138 140 137 140 139  K 3.8 3.9 3.6 3.4* 3.8 3.7 4.2 3.9  CL 100 100 99 102 99 101 101 104  CO2 '27 28 30 29 29 29 29 29  '$ GLUCOSE 105* 95 105* 112* 113* 116* 104* 119*  BUN 78* 80* 47* 52* 41* 45* 51* 60*  CREATININE 2.74* 2.71* 1.94* 2.16* 1.72* 2.00* 2.20* 2.43*  CALCIUM 8.2* 8.3*  8.0* 8.4* 8.4* 8.2* 8.5* 8.2*  MG 2.3  --  2.0 2.2 1.9 2.1  --   --   PHOS 4.3 4.4  --   --  2.9 3.2 3.8 3.6   Estimated Creatinine Clearance: 23.9 mL/min (A) (by C-G formula based on SCr of 2.43 mg/dL (H)). Liver & Pancreas: Recent Labs  Lab 01/24/22 0500 01/24/22 1504 01/27/22 0536  01/28/22 0426 01/29/22 0607 01/30/22 0852  AST 15  --   --  16  --   --   ALT 15  --   --  17  --   --   ALKPHOS 88  --   --  113  --   --   BILITOT 0.6  --   --  0.6  --   --   PROT 5.1*  --   --  5.5*  --   --   ALBUMIN 1.8* 1.9* 1.8* 1.9* 1.9* 1.7*   No results for input(s): "LIPASE", "AMYLASE" in the last 168 hours. No results for input(s): "AMMONIA" in the last 168 hours. Diabetic: No results for input(s): "HGBA1C" in the last 72 hours. Recent Labs  Lab 01/29/22 1205 01/29/22 1912 01/30/22 0008 01/30/22 0648 01/30/22 1202  GLUCAP 119* 103* 130* 115* 137*   Cardiac Enzymes: No results for input(s): "CKTOTAL", "CKMB", "CKMBINDEX", "TROPONINI" in the last 168 hours. No results for input(s): "PROBNP" in the last 8760 hours. Coagulation Profile: No results for input(s): "INR", "PROTIME" in the last 168 hours. Thyroid Function Tests: No results for input(s): "TSH", "T4TOTAL", "FREET4", "T3FREE", "THYROIDAB" in the last 72 hours. Lipid Profile: Recent Labs    01/28/22 0426  TRIG 45    Anemia Panel: No results for input(s): "VITAMINB12", "FOLATE", "FERRITIN", "TIBC", "IRON", "RETICCTPCT" in the last 72 hours.  Urine analysis:    Component Value Date/Time   COLORURINE YELLOW 01/11/2022 1051   APPEARANCEUR CLOUDY (A) 01/11/2022 1051   APPEARANCEUR Clear 03/01/2021 1257   LABSPEC 1.010 01/11/2022 1051   PHURINE 5.0 01/11/2022 1051   GLUCOSEU NEGATIVE 01/11/2022 1051   HGBUR SMALL (A) 01/11/2022 1051   BILIRUBINUR NEGATIVE 01/11/2022 1051   BILIRUBINUR Negative 03/01/2021 [place] 01/11/2022 1051   PROTEINUR NEGATIVE 01/11/2022 1051   NITRITE NEGATIVE 01/11/2022 1051   LEUKOCYTESUR LARGE (A) 01/11/2022 1051   Sepsis Labs: Invalid input(s): "PROCALCITONIN", "LACTICIDVEN"  Microbiology: No results found for this or any previous visit (from the past 240 hour(s)).   Radiology Studies: DG Abd 1 View  Result Date: 01/29/2022 CLINICAL DATA:   Feeding tube placement EXAM: ABDOMEN - 1 VIEW COMPARISON:  01/29/2022, CT 01/17/2022 FINDINGS: Two low resolution intraoperative spot views of the abdomen are submitted. Images were acquired during feeding tube placement. Total fluoroscopy time was 48 seconds, dose of 8.7 mGy. The images demonstrate tip of esophageal tube to overlie the stomach. Contrast injection opacifies the stomach and proximal duodenum. Patient is noted to have moderate hiatal hernia on CT IMPRESSION: Fluoroscopic assistance provided during feeding tube placement with tip of tube in the stomach. Electronically Signed   By: Donavan Foil M.D.   On: 01/29/2022 18:20      Carrie Usery T. [place]  If 7PM-7AM, please contact night-coverage [email or web address] 01/30/2022, 2:17 PM

## 2022-01-30 NOTE — Discharge Instructions (Signed)

## 2022-01-30 NOTE — Progress Notes (Signed)
[facility] Progress Note  Name: [name] MRN: [id number] DOB: [birth year]   Subjective:  strict ins/outs are still not available.  She had 375 ml uop documented over 9/5 with one unmeasured urine void.  Spoke with RN as well.  Patient is for HD today - hasn't had it yet.  Discussed given the need for HD again would still recommend the tunneled catheter placement as we discussed yesterday.   Review of systems:     States she is still having some shortness of breath  Some nausea Not really taking much po  Really hard of hearing but is baseline    Intake/Output Summary (Last 24 hours) at 01/30/2022 1401 Last data filed at 01/30/2022 0800 Gross per 24 hour  Intake 1293.37 ml  Output 375 ml  Net 918.37 ml    Vitals:  Vitals:   01/29/22 0600 01/29/22 1713 01/29/22 1947 01/30/22 0358  BP: 132/74 97/63 115/74 129/85  Pulse: 73 76 74 71  Resp: '18 13 16 15  '$ Temp: 98 F (36.7 C)  97.9 F (36.6 C) 97.9 F (36.6 C)  TempSrc: Oral  Oral Oral  SpO2: 96% 97% 98% 96%  Weight: 106.3 kg   107.3 kg  Height:         Physical Exam:    General elderly female in bed in no acute distress HEENT normocephalic atraumatic extraocular movements intact sclera anicteric Neck supple trachea midline Lungs clear but reduced to auscultation bilaterally normal work of breathing at rest on oxygen; wet cough Heart S1S2 no rub Abdomen soft nontender nondistended Extremities no edema  Psych normal mood and affect Neuro very hard of hearing; awake and alert and following commands  Access left IJ nontunneled catheter in place   Medications reviewed   Labs:     Latest Ref Rng & Units 01/30/2022    8:52 AM 01/29/2022    6:07 AM 01/28/2022    4:26 AM  BMP  Glucose 70 - 99 mg/dL 119  104  116   BUN 8 - 23 mg/dL 60  51  45   Creatinine 0.44 - 1.00 mg/dL 2.43  2.20  2.00   Sodium 135 - 145 mmol/L 139  140  137   Potassium 3.5 - 5.1 mmol/L 3.9  4.2  3.7   Chloride 98 - 111 mmol/L 104  101   101   CO2 22 - 32 mmol/L '29  29  29   '$ Calcium 8.9 - 10.3 mg/dL 8.2  8.5  8.2      Assessment/Plan:   Pt is a 76 y.o. yo female with history of HTN, HLD, hypothyroidism, PE, presented with SBO underwent emergent ex lap with lysis of adhesion on 8/17, developed anterior dependent respiratory failure, AKI requiring CRRT.   #Acute kidney injury, oligoanuric, dialysis dependent: Likely ischemic ATN from shock/sepsis.  She was on ARB prior to admission.  Initially required CRRT which was stopped on 8/21 and initiated IHD on 8/23.  Seen by palliative care team.   - I have re-ordered strict ins/outs  - HD today  - Will keep NPO after midnight for tunneled HD catheter on 9/7 - consulted IR for tunneled HD catheter placement  - she would also need outpatient HD for AKI arrangement.  - would please avoid buspirone given her AKI    #Septic shock, off of pressors.  Improved blood pressure    #High-grade small bowel obstruction now status post ex lap and lysis of adhesion.  On TPN.  per  primary and general surgery   #Hyponatremia, hypervolemic: resolved   #Anemia of critical illness: Received a unit of blood transfusion on 8/29 and on 9/2.  iron deficiency - s/p repletion. Started aranesp 60 mcg weekly on tuesdays. Transfuse PRBC's as needed.  Dispo - Continue inpatient monitoring.  She has still intermittently needed HD.  Contacted HD SW to update.    Claudia Desanctis, MD 01/30/2022 2:01 PM

## 2022-01-31 ENCOUNTER — Inpatient Hospital Stay (HOSPITAL_COMMUNITY): Payer: Medicare Other

## 2022-01-31 DIAGNOSIS — J449 Chronic obstructive pulmonary disease, unspecified: Secondary | ICD-10-CM | POA: Diagnosis not present

## 2022-01-31 DIAGNOSIS — J9690 Respiratory failure, unspecified, unspecified whether with hypoxia or hypercapnia: Secondary | ICD-10-CM | POA: Diagnosis not present

## 2022-01-31 DIAGNOSIS — R197 Diarrhea, unspecified: Secondary | ICD-10-CM

## 2022-01-31 DIAGNOSIS — A419 Sepsis, unspecified organism: Secondary | ICD-10-CM | POA: Diagnosis not present

## 2022-01-31 DIAGNOSIS — K56609 Unspecified intestinal obstruction, unspecified as to partial versus complete obstruction: Secondary | ICD-10-CM | POA: Diagnosis not present

## 2022-01-31 DIAGNOSIS — R1030 Lower abdominal pain, unspecified: Secondary | ICD-10-CM

## 2022-01-31 HISTORY — PX: IR FLUORO GUIDE CV LINE LEFT: IMG2282

## 2022-01-31 HISTORY — PX: IR US GUIDE VASC ACCESS LEFT: IMG2389

## 2022-01-31 LAB — TRIGLYCERIDES: Triglycerides: 51 mg/dL (ref <150)

## 2022-01-31 LAB — COMPREHENSIVE METABOLIC PANEL
ALT: 16 U/L (ref 0–44)
AST: 20 U/L (ref 15–41)
Albumin: 1.8 g/dL — ABNORMAL LOW (ref 3.5–5.0)
Alkaline Phosphatase: 160 U/L — ABNORMAL HIGH (ref 38–126)
Anion gap: 5 (ref 5–15)
BUN: 41 mg/dL — ABNORMAL HIGH (ref 8–23)
CO2: 30 mmol/L (ref 22–32)
Calcium: 8.1 mg/dL — ABNORMAL LOW (ref 8.9–10.3)
Chloride: 102 mmol/L (ref 98–111)
Creatinine, Ser: 1.82 mg/dL — ABNORMAL HIGH (ref 0.44–1.00)
GFR, Estimated: 29 mL/min — ABNORMAL LOW (ref >60)
Glucose, Bld: 106 mg/dL — ABNORMAL HIGH (ref 70–99)
Potassium: 3.7 mmol/L (ref 3.5–5.1)
Sodium: 137 mmol/L (ref 135–145)
Total Bilirubin: 0.4 mg/dL (ref 0.3–1.2)
Total Protein: 5.4 g/dL — ABNORMAL LOW (ref 6.5–8.1)

## 2022-01-31 LAB — CBC
HCT: 27.2 % — ABNORMAL LOW (ref 36.0–46.0)
Hemoglobin: 8.3 g/dL — ABNORMAL LOW (ref 12.0–15.0)
MCH: 28.1 pg (ref 26.0–34.0)
MCHC: 30.5 g/dL (ref 30.0–36.0)
MCV: 92.2 fL (ref 80.0–100.0)
Platelets: 368 10*3/uL (ref 150–400)
RBC: 2.95 MIL/uL — ABNORMAL LOW (ref 3.87–5.11)
RDW: 17.8 % — ABNORMAL HIGH (ref 11.5–15.5)
WBC: 6.3 10*3/uL (ref 4.0–10.5)
nRBC: 0 % (ref 0.0–0.2)

## 2022-01-31 LAB — GLUCOSE, CAPILLARY
Glucose-Capillary: 112 mg/dL — ABNORMAL HIGH (ref 70–99)
Glucose-Capillary: 132 mg/dL — ABNORMAL HIGH (ref 70–99)
Glucose-Capillary: 82 mg/dL (ref 70–99)

## 2022-01-31 LAB — HEPARIN LEVEL (UNFRACTIONATED): Heparin Unfractionated: 0.36 IU/mL (ref 0.30–0.70)

## 2022-01-31 LAB — PHOSPHORUS: Phosphorus: 2.4 mg/dL — ABNORMAL LOW (ref 2.5–4.6)

## 2022-01-31 LAB — MAGNESIUM: Magnesium: 1.8 mg/dL (ref 1.7–2.4)

## 2022-01-31 MED ORDER — GELATIN ABSORBABLE 12-7 MM EX MISC
CUTANEOUS | Status: AC | PRN
  Administered 2022-01-31: 1 via TOPICAL

## 2022-01-31 MED ORDER — LIDOCAINE-EPINEPHRINE 1 %-1:100000 IJ SOLN
INTRAMUSCULAR | Status: AC | PRN
  Administered 2022-01-31: 10 mL via INTRADERMAL

## 2022-01-31 MED ORDER — MIDAZOLAM HCL 2 MG/2ML IJ SOLN
INTRAMUSCULAR | Status: AC | PRN
  Administered 2022-01-31: 1 mg via INTRAVENOUS

## 2022-01-31 MED ORDER — DICYCLOMINE HCL 10 MG PO CAPS
10.0000 mg | ORAL_CAPSULE | Freq: Three times a day (TID) | ORAL | Status: DC
  Administered 2022-01-31 – 2022-02-04 (×8): 10 mg via ORAL
  Filled 2022-01-31 (×9): qty 1

## 2022-01-31 MED ORDER — STERILE WATER FOR INJECTION IV SOLN
INTRAVENOUS | Status: AC
  Filled 2022-01-31: qty 368.67

## 2022-01-31 MED ORDER — VANCOMYCIN HCL IN DEXTROSE 1-5 GM/200ML-% IV SOLN
INTRAVENOUS | Status: AC
  Filled 2022-01-31: qty 200

## 2022-01-31 MED ORDER — HEPARIN SODIUM (PORCINE) 1000 UNIT/ML IJ SOLN
INTRAMUSCULAR | Status: AC | PRN
  Administered 2022-01-31: 2800 [IU] via INTRAVENOUS

## 2022-01-31 MED ORDER — FENTANYL CITRATE (PF) 100 MCG/2ML IJ SOLN
INTRAMUSCULAR | Status: AC
  Filled 2022-01-31: qty 2

## 2022-01-31 MED ORDER — FUROSEMIDE 10 MG/ML IJ SOLN
60.0000 mg | Freq: Once | INTRAMUSCULAR | Status: AC
  Administered 2022-01-31: 60 mg via INTRAVENOUS
  Filled 2022-01-31: qty 6

## 2022-01-31 MED ORDER — LIDOCAINE-EPINEPHRINE 1 %-1:100000 IJ SOLN
INTRAMUSCULAR | Status: AC
  Filled 2022-01-31: qty 1

## 2022-01-31 MED ORDER — HEPARIN SODIUM (PORCINE) 1000 UNIT/ML IJ SOLN
INTRAMUSCULAR | Status: AC
  Filled 2022-01-31: qty 10

## 2022-01-31 MED ORDER — STERILE WATER FOR INJECTION IV SOLN: INTRAVENOUS | Status: DC

## 2022-01-31 MED ORDER — MIDAZOLAM HCL 2 MG/2ML IJ SOLN
INTRAMUSCULAR | Status: AC
  Filled 2022-01-31: qty 2

## 2022-01-31 MED ORDER — GELATIN ABSORBABLE 12-7 MM EX MISC
CUTANEOUS | Status: AC
  Filled 2022-01-31: qty 1

## 2022-01-31 MED ORDER — FENTANYL CITRATE (PF) 100 MCG/2ML IJ SOLN
INTRAMUSCULAR | Status: AC | PRN
  Administered 2022-01-31: 50 ug via INTRAVENOUS

## 2022-01-31 MED ORDER — VANCOMYCIN HCL IN DEXTROSE 1-5 GM/200ML-% IV SOLN
INTRAVENOUS | Status: AC | PRN
  Administered 2022-01-31: 1000 mg via INTRAVENOUS

## 2022-01-31 NOTE — Progress Notes (Signed)
PROGRESS NOTE  [name] XNT:[id number] DOB: Oct 28, 1945   PCP: Chevis Pretty, FNP  Patient is from: Home  DOA: 01/07/2022 LOS: 18  Chief complaints Chief Complaint  Patient presents with   Abdominal Pain     Brief Narrative / Interim history: 76 year old F with PMH of PE, HTN, HLD, hypothyroidism, osteoarthritis and depression returned to [facility] ED with nausea, vomiting and diarrhea and found to small bowel obstruction.  She then developed AKI and septic shock and underwent emergent ex lap with lysis of adhesion on 8/17.  Postoperatively, she remained intubated, started on CRRT and transferred to [facility] on 8/19.  Eventually extubated on 8/21, and started on TNA.  She is started intermittent HD on 8/23.   Hospital course complicated by A-fib with RVR and diarrhea.  Cardiology consulted and she was started on IV amiodarone for A-fib with RVR.  In regards to diarrhea, C. difficile negative.  GIP positive for STEC.   Patient had repeat CT abdomen as part of evaluation for worsening abdominal pain that was negative for abscess but moderate bilateral pleural effusion.  She underwent right thoracocentesis with removal of 1.1 L on 8/25.  General surgery and nephrology following.  SLP recommended dysphagia 3 diet after FEES on 8/30.  TPN at half rate.  However, p.o. intake remains poor.  IR placed cortrack after unsuccessful attempt by RD on 9/5.  Tube feeding started on 9/6.  Orient placed on 9/7.  Disposition likely LTACH once we have final recommendation by nephrology about HD, and she tolerates tube feed.  Subjective: Seen and examined earlier this morning.  No major events overnight of this morning.  Continues to endorse pain across lower abdomen.  She is not able to prescribe but she rates her pain 8/10 although she does not seem to be in that much distress.  She denies nausea or vomiting.  RN reports loose stools.  Objective: Vitals:   01/31/22 0935 01/31/22 0940 01/31/22  0955 01/31/22 1026  BP: 124/72 (!) 140/66 (!) 150/81 (!) 140/77  Pulse: 83 83  79  Resp: $Remo'13 15  16  'MBueS$ Temp:      TempSrc:      SpO2: 98% 94%  98%  Weight:      Height:        Examination:   GENERAL: No apparent distress.  Nontoxic. HEENT: MMM.  Vision and hearing grossly intact.  NECK: Supple.  No apparent JVD.  RESP:  No IWOB.  Fair aeration bilaterally. CVS:  RRR. Heart sounds normal.  ABD/GI/GU: BS+. Abd soft, NTND.  Steri-Strips over surgical wound. MSK/EXT:  Moves extremities.  No apparent deformity.  Trace BLE edema.  Compression wear over LUE SKIN: no apparent skin lesion or wound NEURO: Awake and alert. Oriented appropriately.  No apparent focal neuro deficit. PSYCH: Calm. Normal affect.   Procedures:  8/17-laparotomy with lysis of adhesion   Microbiology summarized: 8/14-urine culture with multiple species 8/17-MRSA PCR screen negative 8/17-blood cultures NGTD 8/19-MRSA PCR screen negative 8/26-C. difficile PCR negative 8/25-GIP with STEC   Assessment and plan: Principal Problem:   Septic shock ([place]) Active Problems:   Respiratory failure requiring intubation (HCC)   Small bowel obstruction (HCC)   COPD (chronic obstructive pulmonary disease) ([place])   Hyperlipidemia   Hypothyroidism (acquired)   Essential hypertension   History of pulmonary embolus (PE)   AKI (acute kidney injury) ([place])   Acute respiratory failure with hypoxia and hypercapnia (HCC)   Pressure injury of skin   Malnutrition of  moderate degree  Septic shock in the setting of mall bowel obstruction and intra-abdominal infection/STEC: Resolved. -Completed antibiotic course with aztreonam and metronidazole on 8/27 -Repeat CT on 8/24 negative for abscess and decreased bowel distention  SBO status post laparotomy 8/17 Dysphagia-SLP recommended dysphagia 3 diet. Decreased oral intake -IR placed cortrack after unsuccessful attempt by RD on 9/5.  Tube feeding started on 9/6.   -Discontinue TPN  once she tolerate tube feed at goal rate.   Acute Metabolic Encephalopathy: In the setting of septic shock and SBO: Resolved.  Oriented x4. -Reorientation and delirium precaution   Paroxysmal A-fib with RVR/PVCs: Currently rate controlled. -Continue metoprolol and amiodarone -On IV heparin for anticoagulation.  Transition to p.o. Eliquis on discharge. -Cardiology signed off.   Acute hypoxic respiratory failure/pulmonary edema/bilateral pleural effusion: Improving. -S/p right thoracocentesis on 8/25 by PCCM -Fluid management by HD per nephrology -Weaned off oxygen this morning.  Encourage incentive  AKI/azotemia: Likely from ATN in the setting of sepsis.  I and O difficult to monitor due to incontinence Recent Labs    01/23/22 0637 01/24/22 0500 01/24/22 1504 01/25/22 0548 01/26/22 0306 01/27/22 0536 01/28/22 0426 01/29/22 0607 01/30/22 0852 01/31/22 0500  BUN 69* 78* 80* 47* 52* 41* 45* 51* 60* 41*  CREATININE 2.48* 2.74* 2.71* 1.94* 2.16* 1.72* 2.00* 2.20* 2.43* 1.82*  -CRRT discontinued on 8/21.  IHD started on 8/23. -Nephrology following-getting IHD as needed. -TDC placed on 9/7. -Monitor intake and output -Avoid nephrotoxic meds   Iron deficiency anemia superimposed on anemia of chronic disease: Transfused on 8/21 and 9/2. Recent Labs    01/21/22 0645 01/22/22 0429 01/23/22 0637 01/24/22 0500 01/24/22 1504 01/25/22 0548 01/26/22 0306 01/27/22 0536 01/28/22 0426 01/31/22 0500  HGB 7.3* 7.1* 7.4* 7.6* 7.6* 7.2* 7.2* 8.5* 8.6* 8.3*  -Continue monitoring   Hyponatremia, hypokalemia, hypophosphatemia: Resolved. -Monitor and correct as appropriate  Abdominal pain/diarrhea/loose stool: Abdominal exam benign.  No fever or leukocytosis to suggest infection. -Continue Imodium as needed -Add Bentyl 10 mg 3 times daily AC   Leukocytosis: Likely demargination.  Resolved.  Chronic LUE lymphedema: -Ace wrap per PT.  Morbid obesity Body mass index is 40.49  kg/m.  Moderate malnutrition Nutrition Problem: Moderate Malnutrition Etiology: acute illness (SBO) Signs/Symptoms: energy intake < 75% for > 7 days, mild muscle depletion, moderate fat depletion Interventions: MVI, TPN, Nepro shake, Tube feeding  Pressure skin injury: POA Pressure Injury 01/10/22 Coccyx Medial;Mid Stage 2 -  Partial thickness loss of dermis presenting as a shallow open injury with a red, pink wound bed without slough. pink (Active)  01/10/22 1200  Location: Coccyx  Location Orientation: Medial;Mid  Staging: Stage 2 -  Partial thickness loss of dermis presenting as a shallow open injury with a red, pink wound bed without slough.  Wound Description (Comments): pink  Present on Admission:   Dressing Type Foam - Lift dressing to assess site every shift 01/30/22 0800     Pressure Injury 01/12/22 Face Left Stage 2 -  Partial thickness loss of dermis presenting as a shallow open injury with a red, pink wound bed without slough. red ulceration beneath ETT holder on L cheek (Active)  01/12/22 1815  Location: Face  Location Orientation: Left  Staging: Stage 2 -  Partial thickness loss of dermis presenting as a shallow open injury with a red, pink wound bed without slough.  Wound Description (Comments): red ulceration beneath ETT holder on L cheek  Present on Admission: Yes     Pressure Injury 01/16/22  Buttocks Left (Active)  01/16/22 1230  Location: Buttocks  Location Orientation: Left  Staging:   Wound Description (Comments):   Present on Admission: Yes  Dressing Type Foam - Lift dressing to assess site every shift 01/30/22 0800   DVT prophylaxis:  Place and maintain sequential compression device Start: 01/20/22 0901  Code Status: DNR/DNI Family Communication: Updated patient's son at bedside on 9/5 Level of care: Telemetry Medical Status is: Inpatient Remains inpatient appropriate because: Acute kidney failure requiring dialysis, poor p.o. intake requiring tube  feed   Final disposition: LTAC once cleared by nephrology and she tolerates tube feed at goal rate. Consultants:  Pulmonology-signed off Nephrology Cardiology-signed off  Sch Meds:  Scheduled Meds:  sodium chloride   Intravenous Once   amiodarone  200 mg Oral Daily   Chlorhexidine Gluconate Cloth  6 each Topical Q0600   Chlorhexidine Gluconate Cloth  6 each Topical Q0600   darbepoetin (ARANESP) injection - NON-DIALYSIS  60 mcg Subcutaneous Q Tue-1800   dicyclomine  10 mg Oral TID AC   escitalopram  10 mg Oral Daily   feeding supplement (NEPRO CARB STEADY)  237 mL Oral BID BM   feeding supplement (PROSource TF20)  60 mL Per Tube BID   ferrous sulfate  325 mg Oral Q breakfast   folic acid  1 mg Oral Daily   guaiFENesin  600 mg Oral BID   metoprolol tartrate  12.5 mg Oral BID   multivitamin with minerals  1 tablet Oral Daily   mouth rinse  15 mL Mouth Rinse 4 times per day   sodium chloride flush  10-40 mL Intracatheter Q12H   Continuous Infusions:  sodium chloride Stopped (01/15/22 1255)   feeding supplement (OSMOLITE 1.5 CAL) 25 mL/hr at 01/31/22 0326   heparin Stopped (32/67/12 4580)   TPN CYCLIC-ADULT (ION)     vancomycin     PRN Meds:.acetaminophen, albuterol, ALPRAZolam, alteplase, guaiFENesin-dextromethorphan, lip balm, loperamide, [DISCONTINUED] ondansetron **OR** ondansetron (ZOFRAN) IV, mouth rinse, mouth rinse, oxyCODONE, sodium chloride flush  Antimicrobials: Anti-infectives (From admission, onward)    Start     Dose/Rate Route Frequency Ordered Stop   01/31/22 0917  vancomycin (VANCOCIN) IVPB 1000 mg/200 mL premix        over 60 Minutes Intravenous Continuous PRN 01/31/22 0918 01/31/22 0917   01/31/22 0600  vancomycin (VANCOCIN) IVPB 1000 mg/200 mL premix        1,000 mg 200 mL/hr over 60 Minutes Intravenous On call 01/30/22 1720 02/01/22 0600   01/16/22 2000  aztreonam (AZACTAM) 1 g in sodium chloride 0.9 % 100 mL IVPB  Status:  Discontinued        1 g 200  mL/hr over 30 Minutes Intravenous Every 8 hours 01/16/22 1840 01/20/22 1254   01/16/22 2000  metroNIDAZOLE (FLAGYL) IVPB 500 mg  Status:  Discontinued        500 mg 100 mL/hr over 60 Minutes Intravenous Every 8 hours 01/16/22 1840 01/20/22 1254   01/15/22 0526  aztreonam (AZACTAM) 1 g in sodium chloride 0.9 % 100 mL IVPB  Status:  Discontinued       See Hyperspace for full Linked Orders Report.   1 g 200 mL/hr over 30 Minutes Intravenous Every 24 hours 01/14/22 1529 01/14/22 1534   01/15/22 0526  aztreonam (AZACTAM) 1 g in sodium chloride 0.9 % 100 mL IVPB       See Hyperspace for full Linked Orders Report.   1 g 200 mL/hr over 30 Minutes Intravenous Every 24 hours  01/14/22 1534 01/15/22 0504   01/13/22 2100  vancomycin (VANCOCIN) IVPB 1000 mg/200 mL premix  Status:  Discontinued        1,000 mg 200 mL/hr over 60 Minutes Intravenous Every 24 hours 01/12/22 2135 01/13/22 1027   01/13/22 0500  aztreonam (AZACTAM) 2 g in sodium chloride 0.9 % 100 mL IVPB  Status:  Discontinued       See Hyperspace for full Linked Orders Report.   2 g 200 mL/hr over 30 Minutes Intravenous Every 12 hours 01/12/22 2135 01/14/22 1529   01/12/22 2015  vancomycin (VANCOREADY) IVPB 1750 mg/350 mL        1,750 mg 175 mL/hr over 120 Minutes Intravenous  Once 01/12/22 1921 01/12/22 2259   01/11/22 1800  aztreonam (AZACTAM) 1 g in sodium chloride 0.9 % 100 mL IVPB  Status:  Discontinued       See Hyperspace for full Linked Orders Report.   1 g 200 mL/hr over 30 Minutes Intravenous Every 8 hours 01/11/22 0815 01/12/22 2135   01/11/22 1000  levofloxacin (LEVAQUIN) IVPB 500 mg  Status:  Discontinued        500 mg 100 mL/hr over 60 Minutes Intravenous Every 48 hours 01/10/22 1432 01/11/22 0744   01/11/22 0915  aztreonam (AZACTAM) 2 g in sodium chloride 0.9 % 100 mL IVPB       See Hyperspace for full Linked Orders Report.   2 g 200 mL/hr over 30 Minutes Intravenous  Once 01/11/22 0815 01/11/22 1045   01/11/22 0845   aztreonam (AZACTAM) 2 g in sodium chloride 0.9 % 100 mL IVPB  Status:  Discontinued        2 g 200 mL/hr over 30 Minutes Intravenous  Once 01/11/22 0747 01/11/22 0815   01/11/22 0845  metroNIDAZOLE (FLAGYL) IVPB 500 mg  Status:  Discontinued        500 mg 100 mL/hr over 60 Minutes Intravenous Every 12 hours 01/11/22 0747 01/15/22 0851   01/10/22 1900  levofloxacin (LEVAQUIN) IVPB 500 mg  Status:  Discontinued        500 mg 100 mL/hr over 60 Minutes Intravenous Every 24 hours 01/10/22 1426 01/10/22 1432   01/10/22 0700  ciprofloxacin (CIPRO) IVPB 400 mg        400 mg 200 mL/hr over 60 Minutes Intravenous On call to O.R. 01/10/22 0612 01/10/22 1052   01/10/22 0700  metroNIDAZOLE (FLAGYL) IVPB 500 mg  Status:  Discontinued        500 mg 100 mL/hr over 60 Minutes Intravenous On call to O.R. 01/10/22 0612 01/10/22 1123   01/08/22 0830  Levofloxacin (LEVAQUIN) IVPB 250 mg  Status:  Discontinued        250 mg 50 mL/hr over 60 Minutes Intravenous Every 24 hours 01/08/22 0724 01/08/22 0727   01/08/22 0830  Levofloxacin (LEVAQUIN) IVPB 250 mg  Status:  Discontinued        250 mg 50 mL/hr over 60 Minutes Intravenous Every 48 hours 01/08/22 0727 01/08/22 0732   01/08/22 0830  Levofloxacin (LEVAQUIN) IVPB 250 mg  Status:  Discontinued        250 mg 50 mL/hr over 60 Minutes Intravenous Every 24 hours 01/08/22 0732 01/10/22 1426        I have personally reviewed the following labs and images: CBC: Recent Labs  Lab 01/24/22 1504 01/25/22 0548 01/26/22 0306 01/27/22 0536 01/28/22 0426 01/31/22 0500  WBC 10.9* 9.1 8.4 7.6  --  6.3  HGB 7.6* 7.2* 7.2* 8.5*  8.6* 8.3*  HCT 24.7* 22.5* 22.7* 27.4* 27.3* 27.2*  MCV 90.5 89.6 90.4 89.5  --  92.2  PLT 560* 541* 526* 519*  --  368   BMP &GFR Recent Labs  Lab 01/25/22 0548 01/26/22 0306 01/27/22 0536 01/28/22 0426 01/29/22 0607 01/30/22 0852 01/31/22 0500  NA 136 138 140 137 140 139 137  K 3.6 3.4* 3.8 3.7 4.2 3.9 3.7  CL 99 102 99 101  101 104 102  CO2 $Re'30 29 29 29 29 29 30  'qBw$ GLUCOSE 105* 112* 113* 116* 104* 119* 106*  BUN 47* 52* 41* 45* 51* 60* 41*  CREATININE 1.94* 2.16* 1.72* 2.00* 2.20* 2.43* 1.82*  CALCIUM 8.0* 8.4* 8.4* 8.2* 8.5* 8.2* 8.1*  MG 2.0 2.2 1.9 2.1  --   --  1.8  PHOS  --   --  2.9 3.2 3.8 3.6 2.4*   Estimated Creatinine Clearance: 31.9 mL/min (A) (by C-G formula based on SCr of 1.82 mg/dL (H)). Liver & Pancreas: Recent Labs  Lab 01/27/22 0536 01/28/22 0426 01/29/22 0607 01/30/22 0852 01/31/22 0500  AST  --  16  --   --  20  ALT  --  17  --   --  16  ALKPHOS  --  113  --   --  160*  BILITOT  --  0.6  --   --  0.4  PROT  --  5.5*  --   --  5.4*  ALBUMIN 1.8* 1.9* 1.9* 1.7* 1.8*   No results for input(s): "LIPASE", "AMYLASE" in the last 168 hours. No results for input(s): "AMMONIA" in the last 168 hours. Diabetic: No results for input(s): "HGBA1C" in the last 72 hours. Recent Labs  Lab 01/30/22 0648 01/30/22 1202 01/30/22 2241 01/31/22 0824 01/31/22 1126  GLUCAP 115* 137* 112* 112* 132*   Cardiac Enzymes: No results for input(s): "CKTOTAL", "CKMB", "CKMBINDEX", "TROPONINI" in the last 168 hours. No results for input(s): "PROBNP" in the last 8760 hours. Coagulation Profile: No results for input(s): "INR", "PROTIME" in the last 168 hours. Thyroid Function Tests: No results for input(s): "TSH", "T4TOTAL", "FREET4", "T3FREE", "THYROIDAB" in the last 72 hours. Lipid Profile: Recent Labs    01/31/22 0500  TRIG 51    Anemia Panel: No results for input(s): "VITAMINB12", "FOLATE", "FERRITIN", "TIBC", "IRON", "RETICCTPCT" in the last 72 hours.  Urine analysis:    Component Value Date/Time   COLORURINE YELLOW 01/11/2022 1051   APPEARANCEUR CLOUDY (A) 01/11/2022 1051   APPEARANCEUR Clear 03/01/2021 1257   LABSPEC 1.010 01/11/2022 1051   PHURINE 5.0 01/11/2022 1051   GLUCOSEU NEGATIVE 01/11/2022 1051   HGBUR SMALL (A) 01/11/2022 1051   BILIRUBINUR NEGATIVE 01/11/2022 1051    BILIRUBINUR Negative 03/01/2021 [place] 01/11/2022 1051   PROTEINUR NEGATIVE 01/11/2022 1051   NITRITE NEGATIVE 01/11/2022 1051   LEUKOCYTESUR LARGE (A) 01/11/2022 1051   Sepsis Labs: Invalid input(s): "PROCALCITONIN", "LACTICIDVEN"  Microbiology: No results found for this or any previous visit (from the past 240 hour(s)).   Radiology Studies: IR Fluoro Guide CV Line Left  Result Date: 01/31/2022 INDICATION: 76 year old female referred for tunneled hemodialysis catheter EXAM: IMAGE GUIDED PLACEMENT OF TUNNELED HEMODIALYSIS CATHETER MEDICATIONS: 1 g vancomycin. The antibiotic was given in an appropriate time interval prior to skin puncture. ANESTHESIA/SEDATION: Moderate (conscious) sedation was employed during this procedure. A total of Versed 1.0 mg and Fentanyl 50 mcg was administered intravenously. Moderate Sedation Time: 14 minutes. The patient's level of consciousness and vital signs were  monitored continuously by radiology nursing throughout the procedure under my direct supervision. FLUOROSCOPY TIME:  Fluoroscopy Time: 0 minutes 36 seconds (14 mGy). COMPLICATIONS: None PROCEDURE: Informed written consent was obtained from the patient after a discussion of the risks, benefits, and alternatives to treatment. Questions regarding the procedure were encouraged and answered. The temporary left-sided IJ hemodialysis catheter was removed with manual pressure for hemostasis. The left neck and chest were then prepped with chlorhexidine in a sterile fashion, and a sterile drape was applied covering the operative field. Maximum barrier sterile technique with sterile gowns and gloves were used for the procedure. A timeout was performed prior to the initiation of the procedure. Ultrasound survey was performed. The left internal jugular vein was confirmed to be patent, with images stored and sent to PACS. Micropuncture kit was utilized to access the left internal jugular vein under direct,  real-time ultrasound guidance after the overlying soft tissues were anesthetized with 1% lidocaine with epinephrine. Stab incision was made with 11 blade scalpel. Microwire was passed centrally. The microwire was then marked to measure appropriate internal catheter length. External tunneled length was estimated. A total tip to cuff length of 23 cm was selected. 035 guidewire was advanced to the level of the IVC. Skin and subcutaneous tissues of chest wall below the clavicle were generously infiltrated with 1% lidocaine for local anesthesia. A small stab incision was made with 11 blade scalpel. The selected hemodialysis catheter was tunneled in a retrograde fashion from the anterior chest wall to the venotomy incision. Serial dilation was performed and then a peel-away sheath was placed. The catheter was then placed through the peel-away sheath with tips ultimately positioned within the superior aspect of the right atrium. Final catheter positioning was confirmed and documented with a spot radiographic image. The catheter aspirates and flushes normally. The catheter was flushed with appropriate volume heparin dwells. The catheter exit site was secured with a 0-Prolene retention suture. Gel-Foam slurry was infused into the soft tissue tract. The venotomy incision was closed Derma bond and sterile dressing. Dressings were applied at the chest wall. Patient tolerated the procedure well and remained hemodynamically stable throughout. No complications were encountered and no significant blood loss encountered. IMPRESSION: Status post image guided left-sided IJ tunneled hemodialysis catheter. Signed, Dulcy Fanny. Nadene Rubins, RPVI [facility] Radiology Electronically Signed   By: Corrie Mckusick D.O.   On: 01/31/2022 10:07   IR US Guide Vasc Access Left  Result Date: 01/31/2022 INDICATION: 76 year old female referred for tunneled hemodialysis catheter EXAM: IMAGE GUIDED  PLACEMENT OF TUNNELED HEMODIALYSIS CATHETER MEDICATIONS: 1 g vancomycin. The antibiotic was given in an appropriate time interval prior to skin puncture. ANESTHESIA/SEDATION: Moderate (conscious) sedation was employed during this procedure. A total of Versed 1.0 mg and Fentanyl 50 mcg was administered intravenously. Moderate Sedation Time: 14 minutes. The patient's level of consciousness and vital signs were monitored continuously by radiology nursing throughout the procedure under my direct supervision. FLUOROSCOPY TIME:  Fluoroscopy Time: 0 minutes 36 seconds (14 mGy). COMPLICATIONS: None PROCEDURE: Informed written consent was obtained from the patient after a discussion of the risks, benefits, and alternatives to treatment. Questions regarding the procedure were encouraged and answered. The temporary left-sided IJ hemodialysis catheter was removed with manual pressure for hemostasis. The left neck and chest were then prepped with chlorhexidine in a sterile fashion, and a sterile drape was applied covering the operative field. Maximum barrier sterile technique with sterile gowns and gloves were used for  the procedure. A timeout was performed prior to the initiation of the procedure. Ultrasound survey was performed. The left internal jugular vein was confirmed to be patent, with images stored and sent to PACS. Micropuncture kit was utilized to access the left internal jugular vein under direct, real-time ultrasound guidance after the overlying soft tissues were anesthetized with 1% lidocaine with epinephrine. Stab incision was made with 11 blade scalpel. Microwire was passed centrally. The microwire was then marked to measure appropriate internal catheter length. External tunneled length was estimated. A total tip to cuff length of 23 cm was selected. 035 guidewire was advanced to the level of the IVC. Skin and subcutaneous tissues of chest wall below the clavicle were generously infiltrated with 1% lidocaine for  local anesthesia. A small stab incision was made with 11 blade scalpel. The selected hemodialysis catheter was tunneled in a retrograde fashion from the anterior chest wall to the venotomy incision. Serial dilation was performed and then a peel-away sheath was placed. The catheter was then placed through the peel-away sheath with tips ultimately positioned within the superior aspect of the right atrium. Final catheter positioning was confirmed and documented with a spot radiographic image. The catheter aspirates and flushes normally. The catheter was flushed with appropriate volume heparin dwells. The catheter exit site was secured with a 0-Prolene retention suture. Gel-Foam slurry was infused into the soft tissue tract. The venotomy incision was closed Derma bond and sterile dressing. Dressings were applied at the chest wall. Patient tolerated the procedure well and remained hemodynamically stable throughout. No complications were encountered and no significant blood loss encountered. IMPRESSION: Status post image guided left-sided IJ tunneled hemodialysis catheter. Signed, Dulcy Fanny. Nadene Rubins, RPVI [facility] Radiology Electronically Signed   By: Corrie Mckusick D.O.   On: 01/31/2022 10:07      Kamorah Nevils T. Floyd  If 7PM-7AM, please contact night-coverage [email or web address] 01/31/2022, 2:02 PM

## 2022-01-31 NOTE — Procedures (Signed)
Interventional Radiology Procedure Note  Procedure: Placement of a left IJ approach Tunneled HD.  23cm tip to cuff.   Tip is positioned at the superior cavoatrial junction and catheter is ready for immediate use.  Prior temp HD removed.  Complications: None  Recommendations:  - Ok to shower tomorrow - Do not submerge - Routine line care  - OK to advance diet per primary order  Signed,  Dulcy Fanny. Earleen Newport, DO

## 2022-01-31 NOTE — Progress Notes (Signed)
PT Lymphedema Note:  Seen for check on management of UE lymphedema.  Noted L sleeve slid down on arm so repositioned and noted little tighter fit at upper arm, but still fitting well.  Noted increased edema R UE and measurements as noted below.  Did not see another sleeve in room for trial on R UE, but might need wrapping, but need to check on wrapping arm with PICC.  Patient educated on same and follow up.   R UE (in cm)  MCP  22.0 Wrist  21.4 10cm prox 31.7 10 cm prox 39.3 Elbow crease 34.5 10 cm prox 33.3    01/31/22 1500  PT Visit Information  Last PT Received On 01/31/22  History of Present Illness 76 yo female admitted 8/14 to [facility] with vomiting and diarrhea with partial SBO. 8/17 intubated and ex lap. 8/19 pt with worsening renal function, transfer to [facility] and CRRT started. 8/21 extubation with CRRT off. PMHx: COPD, HLD, HTN, PE, hypothyroidism, OA and depression  Precautions  Precautions Fall  Required Braces or Orthoses Other Brace (L UE compression sleeve)  Restrictions  Weight Bearing Restrictions No  Pain Assessment  Pain Assessment Faces  Faces Pain Scale 4  Pain Location B UE with movement and generalized  Pain Descriptors / Indicators Discomfort;Grimacing  Pain Intervention(s) Limited activity within patient's tolerance  Cognition  Arousal/Alertness Lethargic  Behavior During Therapy Flat affect  Overall Cognitive Status Impaired/Different from baseline  Area of Impairment Memory;Attention;Following commands  Current Attention Level Sustained  Memory Decreased short-term memory  Following Commands Follows one step commands with increased time;Follows one step commands consistently  Problem Solving Slow processing  PT - End of Session  Activity Tolerance Patient tolerated treatment well  Patient left in bed;with call bell/phone within reach   PT - Assessment/Plan  PT Plan Current plan remains appropriate  PT Visit Diagnosis Other abnormalities of gait and  mobility (R26.89);Difficulty in walking, not elsewhere classified (R26.2);Muscle weakness (generalized) (M62.81)  PT Frequency (ACUTE ONLY) Min 3X/week  Follow Up Recommendations Skilled nursing-short term rehab (<3 hours/day)  Can patient physically be transported by private vehicle No  Assistance recommended at discharge Frequent or constant Supervision/Assistance  Patient can return home with the following Two people to help with walking and/or transfers;Direct supervision/assist for medications management;Two people to help with bathing/dressing/bathroom;Assist for transportation;Direct supervision/assist for financial management  PT equipment Other (comment);Hospital bed  AM-PAC PT "6 Clicks" Mobility Outcome Measure (Version 2)  Help needed turning from your back to your side while in a flat bed without using bedrails? 2  Help needed moving from lying on your back to sitting on the side of a flat bed without using bedrails? 1  Help needed moving to and from a bed to a chair (including a wheelchair)? 1  Help needed standing up from a chair using your arms (e.g., wheelchair or bedside chair)? 1  Help needed to walk in hospital room? 1  Help needed climbing 3-5 steps with a railing?  1  6 Click Score 7  Consider Recommendation of Discharge To: CIR/SNF/LTACH  Progressive Mobility  What is the highest level of mobility based on the progressive mobility assessment? Level 1 (Bedfast) - Unable to balance while sitting on edge of bed  PT Goal Progression  Progress towards PT goals Not progressing toward goals - comment  PT Time Calculation  PT Start Time (ACUTE ONLY) 1510  PT Stop Time (ACUTE ONLY) 1530  PT Time Calculation (min) (ACUTE ONLY) 20 min  PT  General Charges  $$ ACUTE PT VISIT 1 Visit  PT Treatments  $Therapeutic Activity 8-22 mins   Magda Kiel, PT Acute Rehabilitation Services Office:[phone] 01/31/2022

## 2022-01-31 NOTE — Progress Notes (Signed)
[place] for Heparin Indication: atrial fibrillation  Allergies  Allergen Reactions   Celebrex [Celecoxib] Swelling   Keflet [Cephalexin] Swelling   Penicillins Hives and Swelling    DID THE REACTION INVOLVE: Swelling of the face/tongue/throat, SOB, or low BP? Yes-swelling-hives Sudden or severe rash/hives, skin peeling, or the inside of the mouth or nose? Unknown Did it require medical treatment? Unknown When did it last happen?    over 10 years   If all above answers are "NO", may proceed with cephalosporin use.    Sulfa Antibiotics Swelling   Kenalog [Triamcinolone Acetonide]     unknown   Latex    Lisinopril Other (See Comments)    unknown   Mobic [Meloxicam]     SWELLING    Patient Measurements: Height: '5\' 4"'$  (162.6 cm) Weight: 107 kg (235 lb 14.3 oz) IBW/kg (Calculated) : 54.7  Heparin Dosing Weight: 72 kg  Vital Signs: Temp: 98.7 F (37.1 C) (09/07 0831) Temp Source: Oral (09/07 0831) BP: 140/66 (09/07 0940) Pulse Rate: 83 (09/07 0940)  Labs: Recent Labs    01/29/22 0607 01/30/22 0443 01/30/22 0852 01/31/22 0500  HGB  --   --   --  8.3*  HCT  --   --   --  27.2*  PLT  --   --   --  368  HEPARINUNFRC 0.42 0.45  --  0.36  CREATININE 2.20*  --  2.43* 1.82*    Estimated Creatinine Clearance: 31.9 mL/min (A) (by C-G formula based on SCr of 1.82 mg/dL (H)).   Assessment: Pharmacy consulted to dose heparin infusion for post-op afib. Patient was not on any AC PTA.  She is noted s/p ex lap and on TPN.  Cortrack placed 9/5 and tube feed started -Heparin level therapeutic at 0.36 on 1650 units/hr. Hgb 8.3  Cost of apixaban $141.92 per month through [employer]. The cost is free when mail order is used ([employer]). I discussed this with the patient and her son and gave them a brochure about the program. Medications can be sent electronically at discharge but will take 14-21 days to get to the patient.   Goal of  Therapy:  Heparin level 0.3-0.7 units/ml Monitor platelets by anticoagulation protocol: Yes   Plan:  Continue heparin infusion at 1650 units/hr Check daily heparin level and CBC  Hildred Laser, PharmD Clinical Pharmacist **Pharmacist phone directory can now be found on [email or web address] ([email or web address]).  Listed under [place].

## 2022-01-31 NOTE — Progress Notes (Signed)
Physical Therapy Treatment Patient Details Name: [name] MRN: [id number] DOB: Mar 14, 1946 Today's Date: 01/31/2022   History of Present Illness 76 yo female admitted 8/14 to [facility] with vomiting and diarrhea with partial SBO. 8/17 intubated and ex lap. 8/19 pt with worsening renal function, transfer to [facility] and CRRT started. 8/21 extubation with CRRT off. Pt undergoing intermittent HD. PMHx: COPD, HLD, HTN, PE, hypothyroidism, OA and depression    PT Comments    Pt lethargic today and very difficult to mobilize. Sat EOB but unable to fully stand on 2 attempts with 2 person assist and use of Stedy. Will continue to work toward improved mobility and updated dc recommendation to [facility].    Recommendations for follow up therapy are one component of a multi-disciplinary discharge planning process, led by the attending physician.  Recommendations may be updated based on patient status, additional functional criteria and insurance authorization.  Follow Up Recommendations  PT at Long-term acute care hospital Can patient physically be transported by private vehicle: No   Assistance Recommended at Discharge Frequent or constant Supervision/Assistance  Patient can return home with the following Two people to help with walking and/or transfers;Direct supervision/assist for medications management;Two people to help with bathing/dressing/bathroom;Assist for transportation;Direct supervision/assist for financial management   Equipment Recommendations  Other (comment);Hospital bed;Wheelchair (measurements PT);Wheelchair cushion (measurements PT) (hoyer lift)    Recommendations for Other Services       Precautions / Restrictions Precautions Precautions: Fall Required Braces or Orthoses: Other Brace (LUE compression sleeve) Restrictions Weight Bearing Restrictions: No     Mobility  Bed Mobility Overal bed mobility: Needs Assistance Bed Mobility: Rolling Rolling: +2 for physical assistance, Max  assist   Supine to sit: +2 for safety/equipment, Max assist Sit to supine: +2 for physical assistance, Total assist   General bed mobility comments: Assist to bring hips/shoulders over to roll. Assist to bring legs off EOB, elevate trunk into sitting and bring hips to EOB. Assist for all aspect returning to supine.    Transfers Overall transfer level: Needs assistance Equipment used: Ambulation equipment used Charlaine Dalton) Transfers: Sit to/from Stand Sit to Stand: From elevated surface, Max assist, +2 physical assistance           General transfer comment: Used bed pad under hips but pt unable to fully rise on 2 attempts with pt's feet sliding forward on Stedy and hips/trunk remaining flexed.    Ambulation/Gait                   Stairs             Wheelchair Mobility    Modified Rankin (Stroke Patients Only)       Balance Overall balance assessment: Needs assistance Sitting-balance support: Feet supported Sitting balance-Leahy Scale: Fair Sitting balance - Comments: once pt assisted in scooting to EOB, able to maintain sitting with supervision   Standing balance support: Bilateral upper extremity supported Standing balance-Leahy Scale: Zero Standing balance comment: Unable to fully stand with +2 max assist and use of Stedy                            Cognition Arousal/Alertness: Lethargic Behavior During Therapy: Flat affect Overall Cognitive Status: Impaired/Different from baseline Area of Impairment: Memory, Attention, Following commands, Problem solving                   Current Attention Level: Sustained Memory: Decreased short-term memory Following Commands: Follows one step  commands consistently, Follows one step commands with increased time     Problem Solving: Slow processing General Comments: Pt sleeping but able to arouse with verbal/tactile stimuli. Slowed throughout treatment.        Exercises      General Comments         Pertinent Vitals/Pain Pain Assessment Pain Assessment: Faces Faces Pain Scale: Hurts little more Pain Location: generalized Pain Descriptors / Indicators: Grimacing Pain Intervention(s): Limited activity within patient's tolerance, Monitored during session, Repositioned    Home Living                          Prior Function            PT Goals (current goals can now be found in the care plan section) Acute Rehab PT Goals Patient Stated Goal: be able to go home Progress towards PT goals: Not progressing toward goals - comment (lethargic)    Frequency    Min 2X/week      PT Plan Discharge plan needs to be updated;Frequency needs to be updated    Co-evaluation              AM-PAC PT "6 Clicks" Mobility   Outcome Measure  Help needed turning from your back to your side while in a flat bed without using bedrails?: Total Help needed moving from lying on your back to sitting on the side of a flat bed without using bedrails?: Total Help needed moving to and from a bed to a chair (including a wheelchair)?: Total Help needed standing up from a chair using your arms (e.g., wheelchair or bedside chair)?: Total Help needed to walk in hospital room?: Total Help needed climbing 3-5 steps with a railing? : Total 6 Click Score: 6    End of Session Equipment Utilized During Treatment: Oxygen Activity Tolerance: Patient limited by fatigue;Patient limited by lethargy Patient left: in bed;with call bell/phone within reach;with bed alarm set Nurse Communication: Mobility status PT Visit Diagnosis: Other abnormalities of gait and mobility (R26.89);Difficulty in walking, not elsewhere classified (R26.2);Muscle weakness (generalized) (M62.81)     Time: 2376-2831 PT Time Calculation (min) (ACUTE ONLY): 30 min  Charges:  $Therapeutic Activity: 23-37 mins                     [place] 01/31/2022, 4:46 PM

## 2022-01-31 NOTE — Progress Notes (Signed)
[facility] Progress Note  Name: Maryan Sivak MRN: [id number] DOB: Sep 30, 1945   Subjective:  She had 450 mL uop over 9/6.  Last HD on 9/6 with 2.5 kg UF.   She had a left IJ tunneled catheter placed with IR earlier this am (they also removed her temporary HD catheter) . RN thinks oxygen can be weaned - she's 97% on 1 liter.  On tube feeds and TPN and she states the TPN is being resumed later today.   Review of systems:     Breathing is ok Limited PO Got sedation with procedure so has been sleepy per RN  Really hard of hearing but is baseline    Intake/Output Summary (Last 24 hours) at 01/31/2022 1501 Last data filed at 01/31/2022 0326 Gross per 24 hour  Intake 1157.09 ml  Output 2950 ml  Net -1792.91 ml    Vitals:  Vitals:   01/31/22 0935 01/31/22 0940 01/31/22 0955 01/31/22 1026  BP: 124/72 (!) 140/66 (!) 150/81 (!) 140/77  Pulse: 83 83  79  Resp: '13 15  16  '$ Temp:      TempSrc:      SpO2: 98% 94%  98%  Weight:      Height:         Physical Exam:     General elderly female in bed in no acute distress HEENT normocephalic atraumatic extraocular movements intact sclera anicteric Neck supple trachea midline Lungs clear but reduced to auscultation bilaterally normal work of breathing at rest on oxygen; wet cough Heart S1S2 no rub Abdomen soft nontender nondistended Extremities no edema  Psych normal mood and affect  Neuro very hard of hearing; awakens with exam but sleepy  Access left IJ tunneled catheter in place   Medications reviewed   Labs:     Latest Ref Rng & Units 01/31/2022    5:00 AM 01/30/2022    8:52 AM 01/29/2022    6:07 AM  BMP  Glucose 70 - 99 mg/dL 106  119  104   BUN 8 - 23 mg/dL 41  60  51   Creatinine 0.44 - 1.00 mg/dL 1.82  2.43  2.20   Sodium 135 - 145 mmol/L 137  139  140   Potassium 3.5 - 5.1 mmol/L 3.7  3.9  4.2   Chloride 98 - 111 mmol/L 102  104  101   CO2 22 - 32 mmol/L '30  29  29   '$ Calcium 8.9 - 10.3 mg/dL 8.1  8.2  8.5       Assessment/Plan:   Pt is a 76 y.o. yo female with history of HTN, HLD, hypothyroidism, PE, presented with SBO underwent emergent ex lap with lysis of adhesion on 8/17, developed anterior dependent respiratory failure, AKI requiring CRRT.   #Acute kidney injury, oligoanuric, dialysis dependent: Likely ischemic ATN from shock/sepsis.  She was on ARB prior to admission.  Initially required CRRT which was stopped on 8/21 and initiated IHD on 8/23.  Seen by palliative care team.  Tunneled catheter LIJ with IR on 9/7 - Anticipate HD on 9/9 - sooner if needed urgently  - monitoring for renal recovery - she has continued to intermittently require dialysis - lasix 60 mg IV once now    #Septic shock, off of pressors.  Improved blood pressure    #High-grade small bowel obstruction now status post ex lap and lysis of adhesion.  On TPN.  per primary and general surgery   #Hyponatremia, hypervolemic: resolved   #Anemia  of critical illness: Received a unit of blood transfusion on 8/29 and on 9/2.  iron deficiency - s/p repletion. Started aranesp 60 mcg weekly on tuesdays. Transfuse PRBC's as needed.  Dispo - Continue inpatient monitoring.  She has still intermittently needed HD.    Claudia Desanctis, MD 01/31/2022 3:18 PM

## 2022-01-31 NOTE — Progress Notes (Addendum)
PHARMACY - TOTAL PARENTERAL NUTRITION CONSULT NOTE   Indication: Prolonged ileus  Patient Measurements: Height: '5\' 4"'$  (162.6 cm) Weight: 107 kg (235 lb 14.3 oz) IBW/kg (Calculated) : 54.7 TPN AdjBW (KG): 67.8 Body mass index is 40.49 kg/m.  Assessment: 76 yo female with septic shock, high-grade small bowel obstruction. Underwent surgery on 8/17 with ex lap and lysis of adhesions for SBO. Reported vomiting and diarrhea since 8/11 up until surgery on 8/17. Pharmacy consulted for TPN due to prolonged ileus.  Glucose / Insulin: no hx DM - CBGs < 140, off SSI Electrolytes:  CoCa 10, Phos 2.4, others wnl Renal: 8/19-8/21 CRRT >> 8/23 HD (last 9/6 x3.5h), Scr 1.82, BUN 41 Hepatic: AST / ALT / tbili /TG wnl, albumin 1.8 Intake / Output; MIVF: UOP 32m (despite lasix 60 IVx1). LBM 9/6 GI Imaging:  8/16 KUB: high grade bowel obstruction  8/24 CT abd: decr gas distention, anasarca throughout abd wall, distention of lower abdominal/pelvic small bowel loops  8/24 KUB: persistent SBO vs. post-op ileus, suspected ascites   9/6 ab xray: confirm cortrak placement  GI Surgeries / Procedures:  8/17 with ex lap and LOA for SBO 9/6: placement of TDC w IR   Central access: triple lumen CVC placed 01/10/22 TPN start date: 01/14/22  Nutritional Goals:  RD Estimated Needs Total Energy Estimated Needs: 1700-1900 Total Protein Estimated Needs: 100-120 gm Total Fluid Estimated Needs: 1L + UOP  Current Nutrition:  TPN + cortrak (without TF orders yet)  Diet: 9/2: Encouraging po intake as much as possible - pt taking some of Nepro shakes, cream of wheat, jello, [place]. 9/3: Po intake remains poor - cream of wheat, 1/2 Nephro, few bites of peach cobbler 9/4: Pt still reports not feeling hungry and losing appetite - she says she is trying. Eating 10-15% of meals, same as yesterday basically. 9/5 planning to start TF  9/6: Pt has Cortrak placed and ab xray confirms placement into tip of stomach. No  TF orders. Back to full TPN rate 9/6 PM: TF orders placed - Osmolite start at 25/hr and advance by 10/hr q8h to goal of 45/hr - TF ran from 15465-[address]12(last charted) was then NPO for procedure w/ IR 9/7: TF to be advanced as tolerated to goal   Plan:  Reduce cycle concentrated TPN to half goal over 12hours (rate 32-680mhr, GIR 0.82-1.64 mg/kg/min) which provides 55 g AA and 874 Kcal, meeting ~50% estimated needs with goal to wean. Electrolytes in TPN: K 0 mEq/L, Ca 0 mEq/L, Phos 0 mmol/L; Continue Na 150 mEq/L (Max),  Increase Mg 7 mEq/L with half TPN, ClXN:TZGY:1.  Continue MVI and trace elements enterally, none in TPN Monitor TPN labs on Mon/Thurs F/u PO intake/diet advancement, enteral feed tolerance/bloating   MaWilson SingerPharmD Clinical Pharmacist 01/31/2022 10:00 AM

## 2022-01-31 NOTE — TOC Progression Note (Addendum)
Transition of Care [facility]) - Progression Note    Patient Details  Name: [name] MRN: [id number] Date of Birth: [birth year]  [facility] CM/SW Contact  Graves-Bigelow, Ocie Cornfield, RN Phone Number: 01/31/2022, 2:59 PM  Clinical Narrative:  Case Manager reached out to [facility] with [facility] this morning to see if the LTAC HD bed can be held. Case Manager spoke with the provider to see if the plan was to be for transition to [place] on Friday. We discussed the patient in progression. MD states that is the hope for LTAC once there is a clear plan by nephrology about the patients dialysis and if she tolerates TF at goal rate. Case Manager unable to reach [place] son and Case Manager called Daughter-in-law Estill Bamberg regarding disposition needs. Case Manager attempted to clarify some of the daughter-in-laws questions and concerns based on the patients care received in the hospital and the disposition plan. Unsure when patient will be medically stable for transition. Case Manager will continue to follow for transition of care needs.     Patient will need outpatient palliative arranged prior to transition.   Expected Discharge Plan: Long Term Acute Care (LTAC) Barriers to Discharge: Continued Medical Work up  Expected Discharge Plan and Services Expected Discharge Plan: Long Term Acute Care (LTAC) In-house Referral: Clinical Social Work Discharge Planning Services: CM Consult Post Acute Care Choice: Long Term Acute Care (LTAC) Living arrangements for the past 2 months: Single Family Home                   DME Agency: NA     Readmission Risk Interventions     No data to display

## 2022-02-01 DIAGNOSIS — J9601 Acute respiratory failure with hypoxia: Secondary | ICD-10-CM | POA: Diagnosis not present

## 2022-02-01 DIAGNOSIS — Z515 Encounter for palliative care: Secondary | ICD-10-CM | POA: Diagnosis not present

## 2022-02-01 DIAGNOSIS — A419 Sepsis, unspecified organism: Secondary | ICD-10-CM | POA: Diagnosis not present

## 2022-02-01 DIAGNOSIS — N179 Acute kidney failure, unspecified: Secondary | ICD-10-CM | POA: Diagnosis not present

## 2022-02-01 DIAGNOSIS — K56609 Unspecified intestinal obstruction, unspecified as to partial versus complete obstruction: Secondary | ICD-10-CM | POA: Diagnosis not present

## 2022-02-01 LAB — RENAL FUNCTION PANEL
Albumin: 1.7 g/dL — ABNORMAL LOW (ref 3.5–5.0)
Anion gap: 6 (ref 5–15)
BUN: 55 mg/dL — ABNORMAL HIGH (ref 8–23)
CO2: 30 mmol/L (ref 22–32)
Calcium: 8.1 mg/dL — ABNORMAL LOW (ref 8.9–10.3)
Chloride: 103 mmol/L (ref 98–111)
Creatinine, Ser: 2.2 mg/dL — ABNORMAL HIGH (ref 0.44–1.00)
GFR, Estimated: 23 mL/min — ABNORMAL LOW (ref >60)
Glucose, Bld: 123 mg/dL — ABNORMAL HIGH (ref 70–99)
Phosphorus: 2.3 mg/dL — ABNORMAL LOW (ref 2.5–4.6)
Potassium: 3.8 mmol/L (ref 3.5–5.1)
Sodium: 139 mmol/L (ref 135–145)

## 2022-02-01 LAB — GLUCOSE, CAPILLARY
Glucose-Capillary: 107 mg/dL — ABNORMAL HIGH (ref 70–99)
Glucose-Capillary: 112 mg/dL — ABNORMAL HIGH (ref 70–99)
Glucose-Capillary: 129 mg/dL — ABNORMAL HIGH (ref 70–99)
Glucose-Capillary: 134 mg/dL — ABNORMAL HIGH (ref 70–99)
Glucose-Capillary: 136 mg/dL — ABNORMAL HIGH (ref 70–99)
Glucose-Capillary: 99 mg/dL (ref 70–99)

## 2022-02-01 LAB — CBC
HCT: 26.8 % — ABNORMAL LOW (ref 36.0–46.0)
Hemoglobin: 8.2 g/dL — ABNORMAL LOW (ref 12.0–15.0)
MCH: 28.3 pg (ref 26.0–34.0)
MCHC: 30.6 g/dL (ref 30.0–36.0)
MCV: 92.4 fL (ref 80.0–100.0)
Platelets: 320 10*3/uL (ref 150–400)
RBC: 2.9 MIL/uL — ABNORMAL LOW (ref 3.87–5.11)
RDW: 17.9 % — ABNORMAL HIGH (ref 11.5–15.5)
WBC: 6.3 10*3/uL (ref 4.0–10.5)
nRBC: 0 % (ref 0.0–0.2)

## 2022-02-01 LAB — HEPARIN LEVEL (UNFRACTIONATED)
Heparin Unfractionated: 0.21 IU/mL — ABNORMAL LOW (ref 0.30–0.70)
Heparin Unfractionated: 0.36 IU/mL (ref 0.30–0.70)

## 2022-02-01 MED ORDER — HEPARIN (PORCINE) 25000 UT/250ML-% IV SOLN
1800.0000 [IU]/h | INTRAVENOUS | Status: DC
  Administered 2022-02-01: 1750 [IU]/h via INTRAVENOUS
  Administered 2022-02-01: 1650 [IU]/h via INTRAVENOUS
  Administered 2022-02-02: 1750 [IU]/h via INTRAVENOUS
  Administered 2022-02-03: 1800 [IU]/h via INTRAVENOUS
  Administered 2022-02-03: 1750 [IU]/h via INTRAVENOUS
  Administered 2022-02-04: 1800 [IU]/h via INTRAVENOUS
  Filled 2022-02-01 (×7): qty 250

## 2022-02-01 MED ORDER — HEPARIN BOLUS VIA INFUSION
1000.0000 [IU] | Freq: Once | INTRAVENOUS | Status: AC
  Administered 2022-02-01: 1000 [IU] via INTRAVENOUS
  Filled 2022-02-01: qty 1000

## 2022-02-01 MED ORDER — FUROSEMIDE 10 MG/ML IJ SOLN
80.0000 mg | Freq: Once | INTRAMUSCULAR | Status: AC
  Administered 2022-02-01: 80 mg via INTRAVENOUS
  Filled 2022-02-01: qty 8

## 2022-02-01 MED ORDER — CHLORHEXIDINE GLUCONATE CLOTH 2 % EX PADS
6.0000 | MEDICATED_PAD | Freq: Every day | CUTANEOUS | Status: DC
  Administered 2022-02-02 – 2022-02-04 (×3): 6 via TOPICAL

## 2022-02-01 MED ORDER — STERILE WATER FOR INJECTION IV SOLN
INTRAVENOUS | Status: AC
  Filled 2022-02-01: qty 368.67

## 2022-02-01 NOTE — Progress Notes (Signed)
[facility] Progress Note  Name: [name] MRN: [id number] DOB: 30-Jul-1945   Subjective:  She had 800 mL uop over 9/7 charted.  Last HD on 9/6 with 2.5 kg UF.  Her nurse is at bedside as she has been titrating tube feeds - per RN patient hasn't tolerated this well.  I called her son while at bedside.   Review of systems:     Sometimes short of breath; coughs Not taking much PO Really hard of hearing but is baseline    Intake/Output Summary (Last 24 hours) at 02/01/2022 1444 Last data filed at 02/01/2022 0451 Gross per 24 hour  Intake 1770.44 ml  Output 700 ml  Net 1070.44 ml    Vitals:  Vitals:   01/31/22 2209 02/01/22 0008 02/01/22 0422 02/01/22 1128  BP: 128/74 119/69 122/62 120/76  Pulse: 89 73 79 83  Resp:  '17 17 18  '$ Temp:  99.1 F (37.3 C) 99.3 F (37.4 C) 98.2 F (36.8 C)  TempSrc:  Axillary Axillary Oral  SpO2:  98% 98% 96%  Weight:   108 kg   Height:         Physical Exam:      General elderly female in bed in no acute distress HEENT normocephalic atraumatic extraocular movements intact sclera anicteric Neck supple trachea midline Lungs clear but reduced to auscultation bilaterally normal work of breathing at rest on oxygen 1 liter; wet cough Heart S1S2 no rub Abdomen soft nontender nondistended Extremities no edema  Psych normal mood and affect  Neuro very hard of hearing; awake on arrival and conversant  Access left IJ tunneled catheter in place   Medications reviewed   Labs:     Latest Ref Rng & Units 02/01/2022   10:47 AM 01/31/2022    5:00 AM 01/30/2022    8:52 AM  BMP  Glucose 70 - 99 mg/dL 123  106  119   BUN 8 - 23 mg/dL 55  41  60   Creatinine 0.44 - 1.00 mg/dL 2.20  1.82  2.43   Sodium 135 - 145 mmol/L 139  137  139   Potassium 3.5 - 5.1 mmol/L 3.8  3.7  3.9   Chloride 98 - 111 mmol/L 103  102  104   CO2 22 - 32 mmol/L '30  30  29   '$ Calcium 8.9 - 10.3 mg/dL 8.1  8.1  8.2      Assessment/Plan:   Pt is a 76 y.o. yo female  with history of HTN, HLD, hypothyroidism, PE, presented with SBO underwent emergent ex lap with lysis of adhesion on 8/17, developed anterior dependent respiratory failure, AKI requiring CRRT.   #Acute kidney injury, oligoanuric, dialysis dependent: Likely ischemic ATN from shock/sepsis.  She was on ARB prior to admission.  Initially required CRRT which was stopped on 8/21 and initiated IHD on 8/23.  Seen by palliative care team.  Tunneled catheter LIJ with IR on 9/7 - Plan for HD on 9/9   - monitoring for renal recovery - she has continued to intermittently require dialysis - lasix 80 mg IV once now  - would plan on dialysis MWF at [facility] if she is to be transferred there  #Septic shock resolved.    #High-grade small bowel obstruction now status post ex lap and lysis of adhesion.  On TPN.  per primary and general surgery   #Hyponatremia, hypervolemic: resolved   #Anemia of critical illness: Received a unit of blood transfusion on 8/29 and on 9/2.  iron deficiency - s/p repletion. Started aranesp 60 mcg weekly on Tuesdays (started 9/5). Transfuse PRBC's as needed.  Dispo - Continue inpatient monitoring.  She has still intermittently needed HD.    Claudia Desanctis, MD 02/01/2022 3:10 PM

## 2022-02-01 NOTE — Progress Notes (Signed)
Progress Note  Patient: [name] WNI:[id number] DOB: 01-29-1946  DOA: 01/07/2022  DOS: 02/01/2022    Brief hospital course: 76 year old F with PMH of PE, HTN, HLD, hypothyroidism, osteoarthritis and depression returned to [facility] ED with nausea, vomiting and diarrhea and found to small bowel obstruction.  She then developed AKI and septic shock and underwent emergent ex lap with lysis of adhesion on 8/17.  Postoperatively, she remained intubated, started on CRRT and transferred to [facility] on 8/19.  Eventually extubated on 8/21, and started on TNA.  She is started intermittent HD on 8/23.    Hospital course complicated by A-fib with RVR and diarrhea.  Cardiology consulted and she was started on IV amiodarone for A-fib with RVR.  In regards to diarrhea, C. difficile negative.  GIP positive for STEC.    Patient had repeat CT abdomen as part of evaluation for worsening abdominal pain that was negative for abscess but moderate bilateral pleural effusion.  She underwent right thoracocentesis with removal of 1.1 L on 8/25.   General surgery and nephrology following.  SLP recommended dysphagia 3 diet after FEES on 8/30. IR placed cortrack after unsuccessful attempt by RD on 9/5.  Tube feeding started on 9/6 though patient having difficulty advancing to goal rate. Mills placed on 9/7 with next session planned 9/9   Assessment and Plan: Septic shock in the setting of mall bowel obstruction and intra-abdominal infection/STEC: Resolved. - Completed antibiotic course with aztreonam and metronidazole on 8/27 - Repeat CT on 8/24 negative for abscess and decreased bowel distention   SBO status post laparotomy 8/17 Dysphagia-SLP recommended dysphagia 3 diet. Decreased oral intake - IR placed cortrack after unsuccessful attempt by RD on 9/5.  Tube feeding started on 9/6. Rate lowered today due to increasing nausea and abdominal pain with advancing rate. It is unclear whether she'll tolerate this.   -Discontinue TPN once she tolerate tube feed at goal rate.   Acute Metabolic Encephalopathy: In the setting of septic shock and SBO: Resolved.  - Monitor with repeat HD 9/9. - If persistent with mild suprapubic tenderness, consider UA.   Paroxysmal A-fib with RVR/PVCs: Currently rate controlled. -Continue metoprolol and amiodarone -On IV heparin for anticoagulation.  Transition to p.o. Eliquis on discharge. -Cardiology signed off.   Acute hypoxic respiratory failure/pulmonary edema/bilateral pleural effusion: Improving. - S/p right thoracocentesis on 8/25 by PCCM - Fluid management by HD per nephrology - Back on oxygen today. ?if related to volume overload from renal failure.   Oliguric AKI: Likely from ATN in the setting of sepsis. CRRT discontinued on 8/21. IHD started on 8/23. - Planning HD again 9/9. ?if lethargy relatively is currently due to azotemia. Thru TDC placed on 9/7. - Made [id number], metabolic parameters worse, with lasix. Repeat IV lasix today per nephrology. Peripheral volume status assessment complicated by lymphedema.  - Avoid nephrotoxic meds   Iron deficiency anemia superimposed on anemia of chronic disease: Transfused on 8/21 and 9/2. - ESA qTuesday per nephrology - Monitor for bleeding   Hyponatremia, hypokalemia, hypophosphatemia: Resolved. - Monitor and correct as appropriate   Abdominal pain/diarrhea/loose stool: Abdominal exam benign.  No fever or leukocytosis to suggest infection. - Continue Imodium as needed - Added Bentyl 10 mg 3 times daily AC   Leukocytosis: Likely demargination.  Resolved.   Chronic lymphedema: - Wrapped per PT - Lower extremity pumps daily x1 hour.   Morbid obesity: Body mass index is 40.49 kg/m.  Pressure injury: POA. Treat by offloading and optimizing nutritional  status. Pressure Injury 01/10/22 Coccyx Medial;Mid Stage 2 -  Partial thickness loss of dermis presenting as a shallow open injury with a red, pink wound bed  without slough. pink (Active)  01/10/22 1200  Location: Coccyx  Location Orientation: Medial;Mid  Staging: Stage 2 -  Partial thickness loss of dermis presenting as a shallow open injury with a red, pink wound bed without slough.  Wound Description (Comments): pink  Present on Admission:   Dressing Type Foam - Lift dressing to assess site every shift 01/31/22 0920     Pressure Injury 01/12/22 Face Left Stage 2 -  Partial thickness loss of dermis presenting as a shallow open injury with a red, pink wound bed without slough. red ulceration beneath ETT holder on L cheek (Active)  01/12/22 1815  Location: Face  Location Orientation: Left  Staging: Stage 2 -  Partial thickness loss of dermis presenting as a shallow open injury with a red, pink wound bed without slough.  Wound Description (Comments): red ulceration beneath ETT holder on L cheek  Present on Admission: Yes  Dressing Type Foam - Lift dressing to assess site every shift 01/26/22 0845     Pressure Injury 01/16/22 Buttocks Left (Active)  01/16/22 1230  Location: Buttocks  Location Orientation: Left  Staging:   Wound Description (Comments):   Present on Admission: Yes  Dressing Type Foam - Lift dressing to assess site every shift 01/31/22 0920   Subjective: Pt HOH but alert and interactive today. Son and therapy at bedside report she's less interactive/drowsier today than previously. She feels in general miserable, dislikes NGT, very deconditioned when attempting to work with therapy today.   Objective: Vitals:   02/01/22 1301 02/01/22 1331 02/01/22 1431 02/01/22 1501  BP: 131/65 116/65 135/74 135/72  Pulse: 83 80 71 77  Resp: 17 19 (!) 21 20  Temp:      TempSrc:      SpO2: 90% 95% 96% 96%  Weight:      Height:       Gen: Frail elderly obese female in no distress Pulm: Nonlabored breathing supplemental oxygen, SpO2 into 80%'s when taken off O2. Diminished. CV: Irreg without murmur, rub, or gallop. No JVD, +bilateral  lymphedema GI: Abdomen soft, midline incision c/d/I, appropriately tender. +suprapubic tenderness. +BS. Ext: Warm, no deformities Skin: No acute appearing rashes, lesions or ulcers on visualized skin. Neuro: Rousable and interactive, not lethargic. No asterixis or focal neurological deficits though is diffusely very weak. Psych: Judgement and insight appear fair. Mood euthymic & affect congruent. Behavior is appropriate.    Data Personally reviewed: CBC: Recent Labs  Lab 01/26/22 0306 01/27/22 0536 01/28/22 0426 01/31/22 0500 02/01/22 0500  WBC 8.4 7.6  --  6.3 6.3  HGB 7.2* 8.5* 8.6* 8.3* 8.2*  HCT 22.7* 27.4* 27.3* 27.2* 26.8*  MCV 90.4 89.5  --  92.2 92.4  PLT 526* 519*  --  368 235   Basic Metabolic Panel: Recent Labs  Lab 01/26/22 0306 01/26/22 0306 01/27/22 0536 01/28/22 0426 01/29/22 0607 01/30/22 0852 01/31/22 0500 02/01/22 1047  NA 138  --  140 137 140 139 137 139  K 3.4*  --  3.8 3.7 4.2 3.9 3.7 3.8  CL 102  --  99 101 101 104 102 103  CO2 29  --  $R'29 29 29 29 30 30  'qf$ GLUCOSE 112*  --  113* 116* 104* 119* 106* 123*  BUN 52*  --  41* 45* 51* 60* 41* 55*  CREATININE 2.16*  --  1.72* 2.00* 2.20* 2.43* 1.82* 2.20*  CALCIUM 8.4*  --  8.4* 8.2* 8.5* 8.2* 8.1* 8.1*  MG 2.2  --  1.9 2.1  --   --  1.8  --   PHOS  --    < > 2.9 3.2 3.8 3.6 2.4* 2.3*   < > = values in this interval not displayed.   GFR: Estimated Creatinine Clearance: 26.5 mL/min (A) (by C-G formula based on SCr of 2.2 mg/dL (H)). Liver Function Tests: Recent Labs  Lab 01/28/22 0426 01/29/22 0607 01/30/22 0852 01/31/22 0500 02/01/22 1047  AST 16  --   --  20  --   ALT 17  --   --  16  --   ALKPHOS 113  --   --  160*  --   BILITOT 0.6  --   --  0.4  --   PROT 5.5*  --   --  5.4*  --   ALBUMIN 1.9* 1.9* 1.7* 1.8* 1.7*   No results for input(s): "LIPASE", "AMYLASE" in the last 168 hours. No results for input(s): "AMMONIA" in the last 168 hours. Coagulation Profile: No results for input(s):  "INR", "PROTIME" in the last 168 hours. Cardiac Enzymes: No results for input(s): "CKTOTAL", "CKMB", "CKMBINDEX", "TROPONINI" in the last 168 hours. BNP (last 3 results) No results for input(s): "PROBNP" in the last 8760 hours. HbA1C: No results for input(s): "HGBA1C" in the last 72 hours. CBG: Recent Labs  Lab 01/31/22 2357 02/01/22 0428 02/01/22 0726 02/01/22 1128 02/01/22 1646  GLUCAP 134* 136* 99 107* 112*   Lipid Profile: Recent Labs    01/31/22 0500  TRIG 51   Thyroid Function Tests: No results for input(s): "TSH", "T4TOTAL", "FREET4", "T3FREE", "THYROIDAB" in the last 72 hours. Anemia Panel: No results for input(s): "VITAMINB12", "FOLATE", "FERRITIN", "TIBC", "IRON", "RETICCTPCT" in the last 72 hours. Urine analysis:    Component Value Date/Time   COLORURINE YELLOW 01/11/2022 1051   APPEARANCEUR CLOUDY (A) 01/11/2022 1051   APPEARANCEUR Clear 03/01/2021 1257   LABSPEC 1.010 01/11/2022 1051   PHURINE 5.0 01/11/2022 1051   GLUCOSEU NEGATIVE 01/11/2022 1051   HGBUR SMALL (A) 01/11/2022 1051   BILIRUBINUR NEGATIVE 01/11/2022 1051   BILIRUBINUR Negative 03/01/2021 [place] 01/11/2022 1051   PROTEINUR NEGATIVE 01/11/2022 1051   NITRITE NEGATIVE 01/11/2022 1051   LEUKOCYTESUR LARGE (A) 01/11/2022 1051   No results found for this or any previous visit (from the past 240 hour(s)).   IR Fluoro Guide CV Line Left  Result Date: 01/31/2022 INDICATION: 77 year old female referred for tunneled hemodialysis catheter EXAM: IMAGE GUIDED PLACEMENT OF TUNNELED HEMODIALYSIS CATHETER MEDICATIONS: 1 g vancomycin. The antibiotic was given in an appropriate time interval prior to skin puncture. ANESTHESIA/SEDATION: Moderate (conscious) sedation was employed during this procedure. A total of Versed 1.0 mg and Fentanyl 50 mcg was administered intravenously. Moderate Sedation Time: 14 minutes. The patient's level of consciousness and vital signs were monitored continuously  by radiology nursing throughout the procedure under my direct supervision. FLUOROSCOPY TIME:  Fluoroscopy Time: 0 minutes 36 seconds (14 mGy). COMPLICATIONS: None PROCEDURE: Informed written consent was obtained from the patient after a discussion of the risks, benefits, and alternatives to treatment. Questions regarding the procedure were encouraged and answered. The temporary left-sided IJ hemodialysis catheter was removed with manual pressure for hemostasis. The left neck and chest were then prepped with chlorhexidine in a sterile fashion, and a sterile drape was applied covering the operative field. Maximum barrier sterile technique  with sterile gowns and gloves were used for the procedure. A timeout was performed prior to the initiation of the procedure. Ultrasound survey was performed. The left internal jugular vein was confirmed to be patent, with images stored and sent to PACS. Micropuncture kit was utilized to access the left internal jugular vein under direct, real-time ultrasound guidance after the overlying soft tissues were anesthetized with 1% lidocaine with epinephrine. Stab incision was made with 11 blade scalpel. Microwire was passed centrally. The microwire was then marked to measure appropriate internal catheter length. External tunneled length was estimated. A total tip to cuff length of 23 cm was selected. 035 guidewire was advanced to the level of the IVC. Skin and subcutaneous tissues of chest wall below the clavicle were generously infiltrated with 1% lidocaine for local anesthesia. A small stab incision was made with 11 blade scalpel. The selected hemodialysis catheter was tunneled in a retrograde fashion from the anterior chest wall to the venotomy incision. Serial dilation was performed and then a peel-away sheath was placed. The catheter was then placed through the peel-away sheath with tips ultimately positioned within the superior aspect of the right atrium. Final catheter positioning  was confirmed and documented with a spot radiographic image. The catheter aspirates and flushes normally. The catheter was flushed with appropriate volume heparin dwells. The catheter exit site was secured with a 0-Prolene retention suture. Gel-Foam slurry was infused into the soft tissue tract. The venotomy incision was closed Derma bond and sterile dressing. Dressings were applied at the chest wall. Patient tolerated the procedure well and remained hemodynamically stable throughout. No complications were encountered and no significant blood loss encountered. IMPRESSION: Status post image guided left-sided IJ tunneled hemodialysis catheter. Signed, Dulcy Fanny. Nadene Rubins, RPVI V[facility] and Interventional Radiology Specialists [facility] Radiology Electronically Signed   By: Corrie Mckusick D.O.   On: 01/31/2022 10:07   IR US Guide Vasc Access Left  Result Date: 01/31/2022 INDICATION: 76 year old female referred for tunneled hemodialysis catheter EXAM: IMAGE GUIDED PLACEMENT OF TUNNELED HEMODIALYSIS CATHETER MEDICATIONS: 1 g vancomycin. The antibiotic was given in an appropriate time interval prior to skin puncture. ANESTHESIA/SEDATION: Moderate (conscious) sedation was employed during this procedure. A total of Versed 1.0 mg and Fentanyl 50 mcg was administered intravenously. Moderate Sedation Time: 14 minutes. The patient's level of consciousness and vital signs were monitored continuously by radiology nursing throughout the procedure under my direct supervision. FLUOROSCOPY TIME:  Fluoroscopy Time: 0 minutes 36 seconds (14 mGy). COMPLICATIONS: None PROCEDURE: Informed written consent was obtained from the patient after a discussion of the risks, benefits, and alternatives to treatment. Questions regarding the procedure were encouraged and answered. The temporary left-sided IJ hemodialysis catheter was removed with manual pressure for hemostasis. The left neck and chest were then prepped with chlorhexidine in  a sterile fashion, and a sterile drape was applied covering the operative field. Maximum barrier sterile technique with sterile gowns and gloves were used for the procedure. A timeout was performed prior to the initiation of the procedure. Ultrasound survey was performed. The left internal jugular vein was confirmed to be patent, with images stored and sent to PACS. Micropuncture kit was utilized to access the left internal jugular vein under direct, real-time ultrasound guidance after the overlying soft tissues were anesthetized with 1% lidocaine with epinephrine. Stab incision was made with 11 blade scalpel. Microwire was passed centrally. The microwire was then marked to measure appropriate internal catheter length. External tunneled length was estimated. A total tip to  cuff length of 23 cm was selected. 035 guidewire was advanced to the level of the IVC. Skin and subcutaneous tissues of chest wall below the clavicle were generously infiltrated with 1% lidocaine for local anesthesia. A small stab incision was made with 11 blade scalpel. The selected hemodialysis catheter was tunneled in a retrograde fashion from the anterior chest wall to the venotomy incision. Serial dilation was performed and then a peel-away sheath was placed. The catheter was then placed through the peel-away sheath with tips ultimately positioned within the superior aspect of the right atrium. Final catheter positioning was confirmed and documented with a spot radiographic image. The catheter aspirates and flushes normally. The catheter was flushed with appropriate volume heparin dwells. The catheter exit site was secured with a 0-Prolene retention suture. Gel-Foam slurry was infused into the soft tissue tract. The venotomy incision was closed Derma bond and sterile dressing. Dressings were applied at the chest wall. Patient tolerated the procedure well and remained hemodynamically stable throughout. No complications were encountered and no  significant blood loss encountered. IMPRESSION: Status post image guided left-sided IJ tunneled hemodialysis catheter. Signed, Dulcy Fanny. Nadene Rubins, RPVI Vascular and Interventional Radiology Specialists [facility] Radiology Electronically Signed   By: Corrie Mckusick D.O.   On: 01/31/2022 10:07    Family Communication: Son at bedside on 2nd rounds  Disposition: Status is: Inpatient Remains inpatient appropriate because: Await renal recovery, monitor for tube feed tolerance, work up and treat hypoxia Planned Discharge Destination:  TBD  Patrecia Pour, MD 02/01/2022 5:23 PM Page by [email or web address]

## 2022-02-01 NOTE — TOC Progression Note (Signed)
Transition of Care [facility]) - Progression Note    Patient Details  Name: [name] MRN: [id number] Date of Birth: 08-01-1945  [facility] CM/SW Contact  Graves-Bigelow, Ocie Cornfield, RN Phone Number: 02/01/2022, 4:15 PM  Clinical Narrative: Patient was discussed in progression rounds with the provider regarding plan of care. Plan will be for the MD to monitor the patient over the weekend and reevaluate the patient on Monday based on tube feeds reaching goal and her HD status. Next HD is for 02-02-22. Provider did speak with the son regarding plan of care and he is appreciative of the communication and agreeable to plan. Case Manager did make [facility] Liaison aware that the patient will remain in the hospital over the weekend. Case Manager will continue to follow for transition of care needs.   Expected Discharge Plan: TBD  Barriers to Discharge: Continued Medical Work up  Expected Discharge Plan and Services Expected Discharge Plan: Long Term Acute Care (LTAC) In-house Referral: Clinical Social Work Discharge Planning Services: CM Consult Post Acute Care Choice: Long Term Acute Care (LTAC) Living arrangements for the past 2 months: Single Family Home                   DME Agency: NA      Readmission Risk Interventions     No data to display

## 2022-02-01 NOTE — Progress Notes (Signed)
PT Lymphedema Note:  Patient with extensive edema R UE so wrapped today for management with Artiflex and 2- 6cm Comprilan and 1- 10 cm Comprilan bandage.  Patient c/o wrist pain and assisted with AAROM and encouraged ROM for improved tolerance.  RN also aware to watch for signs of intolerance and to remove if needed.  PT will follow up 9/11 to measure and re-wrap.  Of note also placed all bands from R to L wrist.     02/01/22 1600  PT Visit Information  Last PT Received On 02/01/22  Assistance Needed +2  History of Present Illness 76 yo female admitted 8/14 to [facility] with vomiting and diarrhea with partial SBO. 8/17 intubated and ex lap. 8/19 pt with worsening renal function, transfer to [facility] and CRRT started. 8/21 extubation with CRRT off. Pt undergoing intermittent HD. PMHx: COPD, HLD, HTN, PE, hypothyroidism, OA and depression  Precautions  Precautions Fall  Required Braces or Orthoses Other Brace  Other Brace LUE compression sleeve  Pain Assessment  Pain Assessment Faces  Faces Pain Scale 4  Pain Location generalized  Pain Descriptors / Indicators Grimacing  Pain Intervention(s) Monitored during session;Relaxation  Cognition  Arousal/Alertness Lethargic  Behavior During Therapy Flat affect  Overall Cognitive Status Impaired/Different from baseline  Area of Impairment Memory;Attention;Following commands;Problem solving  Current Attention Level Sustained  Memory Decreased short-term memory  Following Commands Follows one step commands with increased time  Problem Solving Slow processing;Decreased initiation;Requires verbal cues  General Exercises - Upper Extremity  Shoulder Flexion AROM;5 reps;Supine;Right  Elbow Extension AROM;5 reps;Supine;Right  Wrist Flexion AAROM;5 reps;Right;Supine  Wrist Extension AAROM;5 reps;Right;Supine  PT - End of Session  Equipment Utilized During Treatment Oxygen  Activity Tolerance Patient tolerated treatment well  Patient left in bed;with call  bell/phone within reach;with bed alarm set  Nurse Communication Other (comment) (monitor wraps and remove if not tolerated, but keep them please)   PT - Assessment/Plan  PT Plan Current plan remains appropriate  PT Visit Diagnosis Other abnormalities of gait and mobility (R26.89);Difficulty in walking, not elsewhere classified (R26.2);Muscle weakness (generalized) (M62.81)  PT Frequency (ACUTE ONLY) Min 2X/week  Follow Up Recommendations PT at Long-term acute care hospital  Can patient physically be transported by private vehicle No  Assistance recommended at discharge Frequent or constant Supervision/Assistance  Patient can return home with the following Two people to help with walking and/or transfers;Direct supervision/assist for medications management;Two people to help with bathing/dressing/bathroom;Assist for transportation;Direct supervision/assist for financial management  PT equipment Hospital bed;Wheelchair (measurements PT)  AM-PAC PT "6 Clicks" Mobility Outcome Measure (Version 2)  Help needed turning from your back to your side while in a flat bed without using bedrails? 1  Help needed moving from lying on your back to sitting on the side of a flat bed without using bedrails? 1  Help needed moving to and from a bed to a chair (including a wheelchair)? 1  Help needed standing up from a chair using your arms (e.g., wheelchair or bedside chair)? 1  Help needed to walk in hospital room? 1  Help needed climbing 3-5 steps with a railing?  1  6 Click Score 6  Consider Recommendation of Discharge To: CIR/SNF/LTACH  Progressive Mobility  What is the highest level of mobility based on the progressive mobility assessment? Level 1 (Bedfast) - Unable to balance while sitting on edge of bed  PT Time Calculation  PT Start Time (ACUTE ONLY) 1625  PT Stop Time (ACUTE ONLY) 1645  PT Time Calculation (  min) (ACUTE ONLY) 20 min  PT General Charges  $$ ACUTE PT VISIT 1 Visit  PT Treatments   $Therapeutic Activity 8-22 mins   Magda Kiel, PT Acute Rehabilitation Services Office:[phone] 02/01/2022

## 2022-02-01 NOTE — Progress Notes (Signed)
Palliative Medicine Inpatient Follow Up Note HPI: [name]  is a 76 yo female with a PMHx: COPD, HLD, HTN, PE, hypothyroidism, OA and depression. Admitted 8/14 to [facility] with vomiting and diarrhea with partial SBO. 8/17 intubated and ex lap. 8/19 pt with worsening renal function, transfer to [facility] and CRRT started. 8/21 extubation with CRRT off.  Palliative care has been asked to get involved to have goals of care conversations in the setting of a prolonged and complicated hospitalization.  Today's Discussion 02/01/2022  *Please note that this is a verbal dictation therefore any spelling or grammatical errors are due to the "Dragon Medical One" system interpretation.  Chart reviewed inclusive of vital signs, progress notes, laboratory results, and diagnostic images.    I met with Geraline and her son, [name] at bedside this afternoon.   Cordell herself is more somnolent today though she will arise to vocal and answer questions. She is quite hard of hearing. Patients pain & nausea have improved.  [name] expresses a great deal of worry over the lack of consistent and clear communication, He shares that he was told this morning if patient does not go to [facility] she will "lose" her bed. He expresses feeling overwhelmed by this. We reviewed that per the MD the plan will be for additional support throughout the weekend and to see how the patient is on Monday. If at that time she appears stable to transfer then she can transition to [facility].   I provided time and space for [name] to express himself. He vocalizes a variety of worries in the future inclusive of his sister who has severe mental health ailments. Provided a safe space for communication. We reviewed that everything going on now is overwhelming. I shared that so much remains unknown about how much or how little progress [place] will make moving forward which also does not help present matters. Discussed some of the "what ifs" of patients present  clinical state.   Plan to monitor patient throughout the weekend.   Palliative support provided to the patient and her son.   Objective Assessment: Vital Signs Vitals:   02/01/22 0008 02/01/22 0422  BP: 119/69 122/62  Pulse: 73 79  Resp: 17 17  Temp: 99.1 F (37.3 C) 99.3 F (37.4 C)  SpO2: 98% 98%    Intake/Output Summary (Last 24 hours) at 02/01/2022 0944 Last data filed at 02/01/2022 0451 Gross per 24 hour  Intake 1770.44 ml  Output 700 ml  Net 1070.44 ml    Last Weight  Most recent update: 02/01/2022  4:25 AM    Weight  108 kg (238 lb 1.6 oz)            Gen: Elderly Caucasian female HEENT: moist mucous membranes CV: Regular rate and rhythm  PULM:  On 2LPM [place], breathing is even and nonlabored ABD: soft/nontender  EXT: (+) LE edema  Neuro: Alert and oriented x2-3  SUMMARY OF RECOMMENDATIONS   DNAR/DNI    Watchful waiting --> For improvements  Generalized weakness --> PT/OT, Needs daily mobility even via lift. Has lymphedema machine which has helped with swelling and allowed more mobility  Anxiety --> on alprazolam Q6H PRN  FTT --> Dietary is involved and patient had a coretrack  in place   Patient's goals are for improvement   TOC - OP Palliative support on discharge --> Will transition to [facility] once optimized   Incremental palliative care support  Billing based on MDM: High ______________________________________________________________________________________ Lamarr Lulas [place]  Palliative Medicine Team Team Cell Phone: [phone] Please utilize secure chat with additional questions, if there is no response within 30 minutes please call the above phone number  Palliative Medicine Team providers are available by phone from 7am to 7pm daily and can be reached through the team cell phone.  Should this patient require assistance outside of these hours, please call the patient's attending physician.

## 2022-02-01 NOTE — Progress Notes (Signed)
Physical Therapy Treatment Patient Details Name: [name] MRN: [id number] DOB: 1945/07/01 Today's Date: 02/01/2022   History of Present Illness 76 yo female admitted 8/14 to [facility] with vomiting and diarrhea with partial SBO. 8/17 intubated and ex lap. 8/19 pt with worsening renal function, transfer to [facility] and CRRT started. 8/21 extubation with CRRT off. Pt undergoing intermittent HD. PMHx: COPD, HLD, HTN, PE, hypothyroidism, OA and depression    PT Comments    Pt is limited by fatigue and weakness at this time, needing significant physical assistance of 2 persons in an effort to get to the edge of bed. Pt with strong posterior lean initially, improving with verbal and visual cues. Pt fatigues rapidly, with limited tolerance for LE exercise. PT continues to recommend LTACH at this time. Pt may benefit from establishing a lifting schedule to get out of bed daily in an effort to improve endurance and activity tolerance.  Recommendations for follow up therapy are one component of a multi-disciplinary discharge planning process, led by the attending physician.  Recommendations may be updated based on patient status, additional functional criteria and insurance authorization.  Follow Up Recommendations  PT at Long-term acute care hospital Can patient physically be transported by private vehicle: No   Assistance Recommended at Discharge Frequent or constant Supervision/Assistance  Patient can return home with the following Two people to help with walking and/or transfers;Direct supervision/assist for medications management;Two people to help with bathing/dressing/bathroom;Assist for transportation;Direct supervision/assist for financial management   Equipment Recommendations  Hospital bed;Wheelchair (measurements PT) (hoyer lift)    Recommendations for Other Services       Precautions / Restrictions Precautions Precautions: Fall Required Braces or Orthoses: Other Brace Other Brace: LUE  compression sleeve Restrictions Weight Bearing Restrictions: No     Mobility  Bed Mobility Overal bed mobility: Needs Assistance Bed Mobility: Supine to Sit, Sit to Supine     Supine to sit: Total assist, +2 for physical assistance, HOB elevated Sit to supine: Total assist, +2 for physical assistance, HOB elevated        Transfers Overall transfer level:  (deferred 2/2 weakness and impaired sitting balance)                      Ambulation/Gait                   Stairs             Wheelchair Mobility    Modified Rankin (Stroke Patients Only)       Balance Overall balance assessment: Needs assistance Sitting-balance support: Single extremity supported, Bilateral upper extremity supported, Feet supported Sitting balance-Leahy Scale: Poor Sitting balance - Comments: ranging from maxA with strong posterior lean to minG with cues for anterior trunk lean Postural control: Posterior lean                                  Cognition Arousal/Alertness: Lethargic (awakens with mobility) Behavior During Therapy: Anxious Overall Cognitive Status: Impaired/Different from baseline Area of Impairment: Memory, Attention, Following commands, Problem solving                 Orientation Level: Disoriented to, Time, Situation Current Attention Level: Sustained Memory: Decreased short-term memory Following Commands: Follows one step commands with increased time Safety/Judgement: Decreased awareness of safety, Decreased awareness of deficits Awareness: Intellectual Problem Solving: Slow processing, Decreased initiation, Requires verbal cues  Exercises General Exercises - Lower Extremity Ankle Circles/Pumps: AROM, Both, 10 reps Long Arc Quad: AROM, Both, 10 reps Hip ABduction/ADduction: Both, 5 reps (isometric)    General Comments General comments (skin integrity, edema, etc.): VSS on 2L [place], brief periods of desaturation to  high 80s but recovers with rest.      Pertinent Vitals/Pain Pain Assessment Pain Assessment: Faces Faces Pain Scale: Hurts little more Pain Location: generalized Pain Descriptors / Indicators: Grimacing Pain Intervention(s): Monitored during session    Home Living                          Prior Function            PT Goals (current goals can now be found in the care plan section) Acute Rehab PT Goals Patient Stated Goal: be able to go home Progress towards PT goals: Not progressing toward goals - comment (increased confusion and weakness)    Frequency    Min 2X/week      PT Plan Current plan remains appropriate    Co-evaluation PT/OT/SLP Co-Evaluation/Treatment: Yes Reason for Co-Treatment: Complexity of the patient's impairments (multi-system involvement);Necessary to address cognition/behavior during functional activity;For patient/therapist safety PT goals addressed during session: Mobility/safety with mobility;Balance;Strengthening/ROM        AM-PAC PT "6 Clicks" Mobility   Outcome Measure  Help needed turning from your back to your side while in a flat bed without using bedrails?: Total Help needed moving from lying on your back to sitting on the side of a flat bed without using bedrails?: Total Help needed moving to and from a bed to a chair (including a wheelchair)?: Total Help needed standing up from a chair using your arms (e.g., wheelchair or bedside chair)?: Total Help needed to walk in hospital room?: Total Help needed climbing 3-5 steps with a railing? : Total 6 Click Score: 6    End of Session Equipment Utilized During Treatment: Oxygen   Patient left: in bed;with call bell/phone within reach;with bed alarm set Nurse Communication: Mobility status;Need for lift equipment PT Visit Diagnosis: Other abnormalities of gait and mobility (R26.89);Difficulty in walking, not elsewhere classified (R26.2);Muscle weakness (generalized) (M62.81)      Time: 1950-9326 PT Time Calculation (min) (ACUTE ONLY): 38 min  Charges:  $Therapeutic Exercise: 8-22 mins $Therapeutic Activity: 8-22 mins                     Zenaida Niece, PT, DPT Acute Rehabilitation Office [place] Macaria Bias 02/01/2022, 11:12 AM

## 2022-02-01 NOTE — Progress Notes (Addendum)
[place] for Heparin Indication: atrial fibrillation  Allergies  Allergen Reactions   Celebrex [Celecoxib] Swelling   Keflet [Cephalexin] Swelling   Penicillins Hives and Swelling    DID THE REACTION INVOLVE: Swelling of the face/tongue/throat, SOB, or low BP? Yes-swelling-hives Sudden or severe rash/hives, skin peeling, or the inside of the mouth or nose? Unknown Did it require medical treatment? Unknown When did it last happen?    over 10 years   If all above answers are "NO", may proceed with cephalosporin use.    Sulfa Antibiotics Swelling   Kenalog [Triamcinolone Acetonide]     unknown   Latex    Lisinopril Other (See Comments)    unknown   Mobic [Meloxicam]     SWELLING    Patient Measurements: Height: '5\' 4"'$  (162.6 cm) Weight: 108 kg (238 lb 1.6 oz) IBW/kg (Calculated) : 54.7 HEPARIN DW (KG): 80   Heparin Dosing Weight: 72 kg  Vital Signs: Temp: 99.3 F (37.4 C) (09/08 0422) Temp Source: Axillary (09/08 0422) BP: 122/62 (09/08 0422) Pulse Rate: 79 (09/08 0422)  Labs: Recent Labs    01/30/22 0443 01/30/22 0852 01/31/22 0500 02/01/22 0500 02/01/22 1048  HGB  --   --  8.3* 8.2*  --   HCT  --   --  27.2* 26.8*  --   PLT  --   --  368 320  --   HEPARINUNFRC 0.45  --  0.36  --  0.21*  CREATININE  --  2.43* 1.82*  --   --     Estimated Creatinine Clearance: 32 mL/min (A) (by C-G formula based on SCr of 1.82 mg/dL (H)).   Assessment: Pharmacy consulted to dose heparin infusion for post-op afib. Patient was not on any AC PTA.  She is noted s/p ex lap and on TPN.  Cortrack placed 9/5 and tube feed started -Heparin level subtherapeutic at 0.21 on 1650 units/hr. Hgb 8.2 -tunneled HD catheter 9/6; heparin was paused 9/7 and restarted ~ 2am on 9/8 -plans are for apixaban as long as she tolerates tube feed  Goal of Therapy:  Heparin level 0.3-0.7 units/ml Monitor platelets by anticoagulation protocol: Yes   Plan:   -Heparin bolus 1000 units x1 and increase to 1750 units/hr -Heparin level in 8 hours and daily wth CBC daily   Hildred Laser, PharmD Clinical Pharmacist **Pharmacist phone directory can now be found on [email or web address] ([email or web address]).  Listed under [place].

## 2022-02-01 NOTE — Progress Notes (Signed)
PHARMACY - TOTAL PARENTERAL NUTRITION CONSULT NOTE   Indication: Prolonged ileus  Patient Measurements: Height: '5\' 4"'$  (162.6 cm) Weight: 108 kg (238 lb 1.6 oz) IBW/kg (Calculated) : 54.7 TPN AdjBW (KG): 67.8 Body mass index is 40.87 kg/m.  Assessment: 76 yo female with septic shock, high-grade small bowel obstruction. Underwent surgery on 8/17 with ex lap and lysis of adhesions for SBO. Reported vomiting and diarrhea since 8/11 up until surgery on 8/17. Pharmacy consulted for TPN due to prolonged ileus.  Glucose / Insulin: no hx DM - CBGs < 140, off SSI Electrolytes:  CoCa 10, Phos 2.3, others wnl Renal: 8/19-8/21 CRRT >> 8/23 HD (last 9/6 x3.5h)- next HD planned for 9/9 per nephro, Scr 1.82, BUN 55 Hepatic: AST / ALT / tbili /TG wnl, albumin 1.8 Intake / Output; MIVF: UOP 412m. NGT out 300. LBM 9/7 GI Imaging:  8/16 KUB: high grade bowel obstruction  8/24 CT abd: decr gas distention, anasarca throughout abd wall, distention of lower abdominal/pelvic small bowel loops  8/24 KUB: persistent SBO vs. post-op ileus, suspected ascites   9/6 ab xray: confirm cortrak placement  GI Surgeries / Procedures:  8/17 with ex lap and LOA for SBO 9/6: placement of TDC w IR   Central access: triple lumen CVC placed 01/10/22 TPN start date: 01/14/22  Nutritional Goals:  RD Estimated Needs Total Energy Estimated Needs: 1700-1900 Total Protein Estimated Needs: 100-120 gm Total Fluid Estimated Needs: 1L + UOP  Current Nutrition:  TPN + cortrak with osmolite   Diet: 9/2: Encouraging po intake as much as possible - pt taking some of Nepro shakes, cream of wheat, jello, [place]. 9/3: Po intake remains poor - cream of wheat, 1/2 Nephro, few bites of peach cobbler 9/4: Pt still reports not feeling hungry and losing appetite - she says she is trying. Eating 10-15% of meals, same as yesterday basically. 9/5 planning to start TF  9/6: Pt has Cortrak placed and ab xray confirms placement into tip  of stomach. No TF orders. Back to full TPN rate 9/6 PM: TF orders placed - Osmolite start at 25/hr and advance by 10/hr q8h to goal of 45/hr - TF ran from 11610-9604(last charted) was then NPO for procedure w/ IR 9/7: TF to be advanced as tolerated to goal  9/8: pt complains of nausea/bloating - TF previously running at 35/hr but now off per MD report.   Plan:  Cycle concentrated TPN at half goal over 12hours (rate 32-650mhr, GIR 0.82-1.64 mg/kg/min) which provides 55 g AA and 874 Kcal, meeting ~50% estimated needs with goal to wean Electrolytes in TPN: K 0 mEq/L, Ca 0 mEq/L, Phos 0 mmol/L; Continue Na 150 mEq/L (Max), Mg 7 mEq/L, ClVW:UJWJ:1.  Continue MVI and trace elements enterally, none in TPN Monitor TPN labs on Mon/Thurs F/u PO intake/diet advancement, enteral feed tolerance/bloating   MaWilson SingerPharmD Clinical Pharmacist 02/01/2022 11:50 AM

## 2022-02-01 NOTE — Progress Notes (Signed)
SLP Cancellation Note  Patient Details Name: [name] MRN: [id number] DOB: 09-Aug-1945   Cancelled treatment:       Reason Eval/Treat Not Completed: Patient at procedure or test/unavailable - pt being bathed. Will continue efforts.  Zoriyah Scheidegger L. Tivis Ringer, MA CCC/SLP Clinical Specialist - Acute Care SLP Acute Rehabilitation Services Office number [phone]   Juan Quam Laurice 02/01/2022, 3:27 PM

## 2022-02-01 NOTE — Progress Notes (Signed)
[place] for Heparin Indication: atrial fibrillation  Allergies  Allergen Reactions   Celebrex [Celecoxib] Swelling   Keflet [Cephalexin] Swelling   Penicillins Hives and Swelling    DID THE REACTION INVOLVE: Swelling of the face/tongue/throat, SOB, or low BP? Yes-swelling-hives Sudden or severe rash/hives, skin peeling, or the inside of the mouth or nose? Unknown Did it require medical treatment? Unknown When did it last happen?    over 10 years   If all above answers are "NO", may proceed with cephalosporin use.    Sulfa Antibiotics Swelling   Kenalog [Triamcinolone Acetonide]     unknown   Latex    Lisinopril Other (See Comments)    unknown   Mobic [Meloxicam]     SWELLING    Patient Measurements: Height: '5\' 4"'$  (162.6 cm) Weight: 108 kg (238 lb 1.6 oz) IBW/kg (Calculated) : 54.7 HEPARIN DW (KG): 80   Heparin Dosing Weight: 72 kg  Vital Signs: Temp: 98.2 F (36.8 C) (09/08 2036) Temp Source: Oral (09/08 2036) BP: 137/71 (09/08 2036) Pulse Rate: 76 (09/08 2036)  Labs: Recent Labs    01/30/22 0852 01/31/22 0500 02/01/22 0500 02/01/22 1047 02/01/22 1048 02/01/22 1935  HGB  --  8.3* 8.2*  --   --   --   HCT  --  27.2* 26.8*  --   --   --   PLT  --  368 320  --   --   --   HEPARINUNFRC  --  0.36  --   --  0.21* 0.36  CREATININE 2.43* 1.82*  --  2.20*  --   --     Estimated Creatinine Clearance: 26.5 mL/min (A) (by C-G formula based on SCr of 2.2 mg/dL (H)).   Assessment: Pharmacy consulted to dose heparin infusion for post-op afib. Patient was not on any AC PTA.  She is noted s/p ex lap and on TPN.  Cortrack placed 9/5 and tube feed started -Heparin level subtherapeutic at 0.21 on 1650 units/hr. Hgb 8.2 -tunneled HD catheter 9/6; heparin was paused 9/7 and restarted ~ 2am on 9/8 -plans are for apixaban as long as she tolerates tube feed  Heparin came back therapeutic tonight. We will continue with current rate.    Goal of Therapy:  Heparin level 0.3-0.7 units/ml Monitor platelets by anticoagulation protocol: Yes   Plan:  -Heparin 1750 units/hr -Heparin level daily wth CBC daily   Onnie Boer, PharmD, [place], AAHIVP, CPP Infectious Disease Pharmacist 02/01/2022 8:54 PM

## 2022-02-01 NOTE — Progress Notes (Signed)
Occupational Therapy Treatment Patient Details Name: [name] MRN: [id number] DOB: June 24, 1945 Today's Date: 02/01/2022   History of present illness 76 yo female admitted 8/14 to [facility] with vomiting and diarrhea with partial SBO. 8/17 intubated and ex lap. 8/19 pt with worsening renal function, transfer to [facility] and CRRT started. 8/21 extubation with CRRT off. Pt undergoing intermittent HD. PMHx: COPD, HLD, HTN, PE, hypothyroidism, OA and depression   OT comments  Patient continues to make steady progress towards goals in skilled OT session. Patient's session encompassed  co-treat with PT in order to progress mobility with two sets of skilled hands. Patient more lethargic and confused in session, remaining a total A of 2 for all bed mobility. Patient session focus on increasing activity tolerance by sitting EOB and working on increasing sitting balance, ADLs, and leg exercises. Patient with need for increased encouragement to participate and fatiguing quickly with minimal activity. Recommendation updated to [facility] due to patient's prognosis, with goals updated. OT will continue to follow.    Recommendations for follow up therapy are one component of a multi-disciplinary discharge planning process, led by the attending physician.  Recommendations may be updated based on patient status, additional functional criteria and insurance authorization.    Follow Up Recommendations  OT at Long-term acute care hospital    Assistance Recommended at Discharge Frequent or constant Supervision/Assistance  Patient can return home with the following  Help with stairs or ramp for entrance;Assistance with feeding;Assistance with cooking/housework;Assist for transportation;Two people to help with walking and/or transfers;Direct supervision/assist for financial management;Two people to help with bathing/dressing/bathroom;Direct supervision/assist for medications management   Equipment Recommendations  Wheelchair cushion  (measurements OT);Wheelchair (measurements OT)    Recommendations for Other Services      Precautions / Restrictions Precautions Precautions: Fall Required Braces or Orthoses: Other Brace Other Brace: LUE compression sleeve Restrictions Weight Bearing Restrictions: No       Mobility Bed Mobility Overal bed mobility: Needs Assistance Bed Mobility: Supine to Sit, Sit to Supine     Supine to sit: Total assist, +2 for physical assistance, HOB elevated Sit to supine: Total assist, +2 for physical assistance, HOB elevated        Transfers                         Balance Overall balance assessment: Needs assistance Sitting-balance support: Single extremity supported, Bilateral upper extremity supported, Feet supported Sitting balance-Leahy Scale: Poor Sitting balance - Comments: ranging from maxA with strong posterior lean to minG with cues for anterior trunk lean Postural control: Posterior lean Standing balance support: Bilateral upper extremity supported Standing balance-Leahy Scale: Zero                             ADL either performed or assessed with clinical judgement   ADL Overall ADL's : Needs assistance/impaired     Grooming: Wash/dry hands;Wash/dry face;Set up Grooming Details (indicate cue type and reason): sitting EOB cues for thoroughness                             Functional mobility during ADLs: Total assistance;+2 for physical assistance;+2 for safety/equipment General ADL Comments: Patient session focus on increasing activity tolerance by sitting EOB and working on increasing sitting balance, ADLs, and leg exercises    Extremity/Trunk Assessment  Vision       Perception     Praxis      Cognition Arousal/Alertness: [employer] fast streamer (awakens with mobility) Behavior During Therapy: Anxious Overall Cognitive Status: Impaired/Different from baseline Area of Impairment: Memory, Attention, Following  commands, Problem solving                 Orientation Level: Disoriented to, Time, Situation Current Attention Level: Sustained Memory: Decreased short-term memory Following Commands: Follows one step commands with increased time Safety/Judgement: Decreased awareness of safety, Decreased awareness of deficits Awareness: Intellectual Problem Solving: Slow processing, Decreased initiation, Requires verbal cues          Exercises      Shoulder Instructions       General Comments VSS on 2L [place], brief periods of desaturation to high 80s but recovers with rest.    Pertinent Vitals/ Pain       Pain Assessment Pain Assessment: Faces Faces Pain Scale: Hurts little more Pain Location: generalized Pain Descriptors / Indicators: Grimacing Pain Intervention(s): Limited activity within patient's tolerance, Monitored during session, Repositioned  Home Living                                          Prior Functioning/Environment              Frequency  Min 2X/week        Progress Toward Goals  OT Goals(current goals can now be found in the care plan section)  Progress towards OT goals: Progressing toward goals  Acute Rehab OT Goals Patient Stated Goal: to get this over with OT Goal Formulation: With patient Time For Goal Achievement: 02/15/22 Potential to Achieve Goals: [place] Discharge plan needs to be updated    Co-evaluation      Reason for Co-Treatment: Complexity of the patient's impairments (multi-system involvement);Necessary to address cognition/behavior during functional activity PT goals addressed during session: Mobility/safety with mobility;Balance;Strengthening/ROM OT goals addressed during session: ADL's and self-care;Strengthening/ROM      AM-PAC OT "6 Clicks" Daily Activity     Outcome Measure   Help from another person eating meals?: A Lot Help from another person taking care of personal grooming?: A Lot Help from  another person toileting, which includes using toliet, bedpan, or urinal?: Total Help from another person bathing (including washing, rinsing, drying)?: A Lot Help from another person to put on and taking off regular upper body clothing?: A Lot Help from another person to put on and taking off regular lower body clothing?: Total 6 Click Score: 10    End of Session    OT Visit Diagnosis: Muscle weakness (generalized) (M62.81)   Activity Tolerance Patient limited by fatigue;Patient limited by lethargy;Patient limited by pain   Patient Left in bed;with call bell/phone within reach;with bed alarm set   Nurse Communication Mobility status        Time: 4287-6811 OT Time Calculation (min): 41 min  Charges: OT General Charges $OT Visit: 1 Visit OT Treatments $Self Care/Home Management : 8-22 mins  Corinne Ports E. Emmali Karow, OTR/L Acute Rehabilitation Services [phone]   Ascencion Dike 02/01/2022, 1:23 PM

## 2022-02-02 DIAGNOSIS — A419 Sepsis, unspecified organism: Secondary | ICD-10-CM | POA: Diagnosis not present

## 2022-02-02 DIAGNOSIS — K56609 Unspecified intestinal obstruction, unspecified as to partial versus complete obstruction: Secondary | ICD-10-CM | POA: Diagnosis not present

## 2022-02-02 DIAGNOSIS — J9601 Acute respiratory failure with hypoxia: Secondary | ICD-10-CM | POA: Diagnosis not present

## 2022-02-02 DIAGNOSIS — N179 Acute kidney failure, unspecified: Secondary | ICD-10-CM | POA: Diagnosis not present

## 2022-02-02 LAB — GLUCOSE, CAPILLARY
Glucose-Capillary: 112 mg/dL — ABNORMAL HIGH (ref 70–99)
Glucose-Capillary: 128 mg/dL — ABNORMAL HIGH (ref 70–99)
Glucose-Capillary: 140 mg/dL — ABNORMAL HIGH (ref 70–99)
Glucose-Capillary: 146 mg/dL — ABNORMAL HIGH (ref 70–99)

## 2022-02-02 LAB — RENAL FUNCTION PANEL
Albumin: 1.8 g/dL — ABNORMAL LOW (ref 3.5–5.0)
Anion gap: 6 (ref 5–15)
BUN: 66 mg/dL — ABNORMAL HIGH (ref 8–23)
CO2: 29 mmol/L (ref 22–32)
Calcium: 8.1 mg/dL — ABNORMAL LOW (ref 8.9–10.3)
Chloride: 103 mmol/L (ref 98–111)
Creatinine, Ser: 2.23 mg/dL — ABNORMAL HIGH (ref 0.44–1.00)
GFR, Estimated: 22 mL/min — ABNORMAL LOW (ref >60)
Glucose, Bld: 141 mg/dL — ABNORMAL HIGH (ref 70–99)
Phosphorus: 2.1 mg/dL — ABNORMAL LOW (ref 2.5–4.6)
Potassium: 3.7 mmol/L (ref 3.5–5.1)
Sodium: 138 mmol/L (ref 135–145)

## 2022-02-02 LAB — CBC
HCT: 25.9 % — ABNORMAL LOW (ref 36.0–46.0)
Hemoglobin: 8.2 g/dL — ABNORMAL LOW (ref 12.0–15.0)
MCH: 29 pg (ref 26.0–34.0)
MCHC: 31.7 g/dL (ref 30.0–36.0)
MCV: 91.5 fL (ref 80.0–100.0)
Platelets: 313 10*3/uL (ref 150–400)
RBC: 2.83 MIL/uL — ABNORMAL LOW (ref 3.87–5.11)
RDW: 17.9 % — ABNORMAL HIGH (ref 11.5–15.5)
WBC: 6.6 10*3/uL (ref 4.0–10.5)
nRBC: 0 % (ref 0.0–0.2)

## 2022-02-02 LAB — HEPARIN LEVEL (UNFRACTIONATED): Heparin Unfractionated: 0.35 IU/mL (ref 0.30–0.70)

## 2022-02-02 MED ORDER — SODIUM PHOSPHATES 45 MMOLE/15ML IV SOLN
15.0000 mmol | Freq: Once | INTRAVENOUS | Status: AC
  Administered 2022-02-02: 15 mmol via INTRAVENOUS
  Filled 2022-02-02: qty 5

## 2022-02-02 MED ORDER — FUROSEMIDE 10 MG/ML IJ SOLN
80.0000 mg | Freq: Once | INTRAMUSCULAR | Status: AC
  Administered 2022-02-03: 80 mg via INTRAVENOUS
  Filled 2022-02-02 (×2): qty 8

## 2022-02-02 MED ORDER — HEPARIN SODIUM (PORCINE) 1000 UNIT/ML IJ SOLN
INTRAMUSCULAR | Status: AC
  Filled 2022-02-02: qty 4

## 2022-02-02 NOTE — Progress Notes (Signed)
[facility] Progress Note  Name: Corinne Goucher MRN: [id number] DOB: 1945/11/29   Subjective:  She had 300 mL uop over 9/8 charted. Seen and examined on dialysis.  Blood pressure 132/77 and HR 77.  Tunn catheter in use.  Tolerating goal.   Review of systems:     Sometimes short of breath; coughs frequently  Really hard of hearing but this is baseline - her device is in room and doesn't have batteries   No intake or output data in the 24 hours ending 02/02/22 1118   Vitals:  Vitals:   02/02/22 0940 02/02/22 1000 02/02/22 1030 02/02/22 1100  BP: 118/72 124/67 119/75 109/68  Pulse:  90 72 69  Resp: '15 17 14 15  '$ Temp:      TempSrc:      SpO2:  100% 99% 98%  Weight:      Height:         Physical Exam:       General elderly female in bed in no acute distress HEENT normocephalic atraumatic extraocular movements intact sclera anicteric Neck supple trachea midline Lungs clear but reduced to auscultation bilaterally normal work of breathing at rest on oxygen 1 liter; wet cough Heart S1S2 no rub Abdomen soft nontender nondistended Extremities 1+ edema  Psych normal mood and affect  Neuro very hard of hearing; awake on arrival and conversant  Access left IJ tunneled catheter in place   Medications reviewed   Labs:     Latest Ref Rng & Units 02/02/2022    5:21 AM 02/01/2022   10:47 AM 01/31/2022    5:00 AM  BMP  Glucose 70 - 99 mg/dL 141  123  106   BUN 8 - 23 mg/dL 66  55  41   Creatinine 0.44 - 1.00 mg/dL 2.23  2.20  1.82   Sodium 135 - 145 mmol/L 138  139  137   Potassium 3.5 - 5.1 mmol/L 3.7  3.8  3.7   Chloride 98 - 111 mmol/L 103  103  102   CO2 22 - 32 mmol/L '29  30  30   '$ Calcium 8.9 - 10.3 mg/dL 8.1  8.1  8.1      Assessment/Plan:   Pt is a 76 y.o. yo female with history of HTN, HLD, hypothyroidism, PE, presented with SBO underwent emergent ex lap with lysis of adhesion on 8/17, developed anterior dependent respiratory failure, AKI requiring CRRT.    #Acute kidney injury, oligoanuric, dialysis dependent: Likely ischemic ATN from shock/sepsis.  She was on ARB prior to admission.  Initially required CRRT which was stopped on 8/21 and initiated IHD on 8/23.  Seen by palliative care team.  Tunneled catheter LIJ with IR on 9/7 - HD today.  Then would plan on dialysis MWF at [facility] if she is to be transferred there - repeat lasix tomorrow  - monitoring for renal recovery  #Septic shock resolved.    #High-grade small bowel obstruction now status post ex lap and lysis of adhesion.  On feeds and off of TPN.  per primary and general surgery   #Hyponatremia, hypervolemic: resolved   #Anemia of critical illness: Received a unit of blood transfusion on 8/29 and on 9/2.  iron deficiency - s/p repletion. Started aranesp 60 mcg weekly on Tuesdays (started 9/5). Transfuse PRBC's as needed.  # Hypophosphatemia - replete   Dispo - acceptable for transition to [facility] per primary team discretion from a strictly renal standpoint.  She has dialysis-dependent AKI  Claudia Desanctis, MD 02/02/2022 11:43 AM

## 2022-02-02 NOTE — Progress Notes (Signed)
[place] for Heparin Indication: atrial fibrillation  Allergies  Allergen Reactions   Celebrex [Celecoxib] Swelling   Keflet [Cephalexin] Swelling   Penicillins Hives and Swelling    DID THE REACTION INVOLVE: Swelling of the face/tongue/throat, SOB, or low BP? Yes-swelling-hives Sudden or severe rash/hives, skin peeling, or the inside of the mouth or nose? Unknown Did it require medical treatment? Unknown When did it last happen?    over 10 years   If all above answers are "NO", may proceed with cephalosporin use.    Sulfa Antibiotics Swelling   Kenalog [Triamcinolone Acetonide]     unknown   Latex    Lisinopril Other (See Comments)    unknown   Mobic [Meloxicam]     SWELLING    Patient Measurements: Height: '5\' 4"'$  (162.6 cm) Weight: 106.2 kg (234 lb 2.1 oz) IBW/kg (Calculated) : 54.7 HEPARIN DW (KG): 80   Heparin Dosing Weight: 72 kg  Vital Signs: Temp: 98 F (36.7 C) (09/09 0545) Temp Source: Oral (09/09 0545) BP: 124/75 (09/09 0545) Pulse Rate: 92 (09/09 0545)  Labs: Recent Labs    01/31/22 0500 02/01/22 0500 02/01/22 1047 02/01/22 1048 02/01/22 1935 02/02/22 0521  HGB 8.3* 8.2*  --   --   --  8.2*  HCT 27.2* 26.8*  --   --   --  25.9*  PLT 368 320  --   --   --  313  HEPARINUNFRC 0.36  --   --  0.21* 0.36 0.35  CREATININE 1.82*  --  2.20*  --   --  2.23*    Estimated Creatinine Clearance: 25.9 mL/min (A) (by C-G formula based on SCr of 2.23 mg/dL (H)).   Assessment: Pharmacy consulted to dose heparin infusion for post-op afib. Patient was not on any AC PTA.  She is noted s/p ex lap and on TPN.  Cortrack placed 9/5 and tube feed started -Heparin level subtherapeutic at 0.21 on 1650 units/hr. Hgb 8.2 -tunneled HD catheter 9/6; heparin was paused 9/7 and restarted ~ 2am on 9/8 -plans are for apixaban as long as she tolerates tube feed  Heparin level therapeutic at 0.35. No issues with infusion reported, CBC  stable, no signs or symptoms of bleeding noted.  Goal of Therapy:  Heparin level 0.3-0.7 units/ml Monitor platelets by anticoagulation protocol: Yes   Plan:  Continue heparin gtt at 1750 units/hr Monitor daily heparin level and CBC Monitor for sign and symptoms of bleeding  Louanne Belton, PharmD, [facility] PGY1 Pharmacy Resident 02/02/2022 7:06 AM

## 2022-02-02 NOTE — Progress Notes (Signed)
Progress Note  Patient: [name] GYF:[id number] DOB: 1945/06/24  DOA: 01/07/2022  DOS: 02/02/2022    Brief hospital course: 76 year old F with PMH of PE, HTN, HLD, hypothyroidism, osteoarthritis and depression returned to [facility] ED with nausea, vomiting and diarrhea and found to small bowel obstruction.  She then developed AKI and septic shock and underwent emergent ex lap with lysis of adhesion on 8/17.  Postoperatively, she remained intubated, started on CRRT and transferred to [facility] on 8/19.  Eventually extubated on 8/21, and started on TNA.  She is started intermittent HD on 8/23.    Hospital course complicated by A-fib with RVR and diarrhea.  Cardiology consulted and she was started on IV amiodarone for A-fib with RVR.  In regards to diarrhea, C. difficile negative.  GIP positive for STEC.    Patient had repeat CT abdomen as part of evaluation for worsening abdominal pain that was negative for abscess but moderate bilateral pleural effusion.  She underwent right thoracocentesis with removal of 1.1 L on 8/25.   General surgery and nephrology following.  SLP recommended dysphagia 3 diet after FEES on 8/30. IR placed cortrack after unsuccessful attempt by RD on 9/5.  Tube feeding started on 9/6 though patient having difficulty advancing to goal rate. [place] placed on 9/7 with next session planned 9/9   Assessment and Plan: Septic shock in the setting of mall bowel obstruction and intra-abdominal infection/STEC: Resolved. - Completed antibiotic course with aztreonam and metronidazole on 8/27 - Repeat CT on 8/24 negative for abscess and decreased bowel distention   SBO status post laparotomy 8/17: Having regular BMs. Dysphagia-SLP recommended dysphagia 3 diet. Decreased oral intake - IR placed cortrack after unsuccessful attempt by RD on 9/5.  Tube feeding started on 9/6.  - Hold TPN today since she's tolerating tube feeds. Hopeful her ability/willingness to take po will improve with  more recovery time since laparotomy. If not, could have PEG placed at [facility] as well.   Acute Metabolic Encephalopathy: In the setting of septic shock and SBO: Resolved.  - Monitor with repeat HD 9/9. She is oriented.   Paroxysmal A-fib with RVR/PVCs: Currently rate controlled. -Continue metoprolol and amiodarone -On IV heparin for anticoagulation.  Transition to enteral Eliquis on discharge. -Cardiology signed off.   Acute hypoxic respiratory failure/pulmonary edema/bilateral pleural effusion: Improving. - S/p right thoracocentesis on 8/25 by PCCM - Fluid management by HD per nephrology - Encourage IS. Monitor exam and hypoxia after HD today.    Oliguric AKI: Likely from ATN in the setting of sepsis. CRRT discontinued on 8/21. IHD started on 8/23. - Repeating HD 9/9 thru [place] placed on 9/7. - Made [id number], metabolic parameters stable to mildly worse. D/w nephrology, Dr. Royce Macadamia at bedside. Patient can be rounded on by nephrology at [facility] regularly to monitor for renal recovery there.  - Avoid nephrotoxic meds   Iron deficiency anemia superimposed on anemia of chronic disease: Transfused on 8/21 and 9/2. - ESA qTuesday per nephrology - Monitor for bleeding   Hyponatremia, hypokalemia, hypophosphatemia: Resolved. - Monitor and correct as appropriate   Abdominal pain/diarrhea/loose stool: Abdominal exam benign.  No fever or leukocytosis to suggest infection. - Continue Imodium as needed - Added Bentyl 10 mg 3 times daily AC   Leukocytosis: Likely demargination.  Resolved.   Chronic lymphedema: - Wrapped per PT - Lower extremity pumps daily x1 hour.   Morbid obesity: Body mass index is 40.49 kg/m.  Pressure injury: POA. Treat by offloading and optimizing nutritional status.  Pressure Injury 01/10/22 Coccyx Medial;Mid Stage 2 -  Partial thickness loss of dermis presenting as a shallow open injury with a red, pink wound bed without slough. pink (Active)  01/10/22 1200   Location: Coccyx  Location Orientation: Medial;Mid  Staging: Stage 2 -  Partial thickness loss of dermis presenting as a shallow open injury with a red, pink wound bed without slough.  Wound Description (Comments): pink  Present on Admission:   Dressing Type Foam - Lift dressing to assess site every shift 02/01/22 2036     Pressure Injury 01/12/22 Face Left Stage 2 -  Partial thickness loss of dermis presenting as a shallow open injury with a red, pink wound bed without slough. red ulceration beneath ETT holder on L cheek (Active)  01/12/22 1815  Location: Face  Location Orientation: Left  Staging: Stage 2 -  Partial thickness loss of dermis presenting as a shallow open injury with a red, pink wound bed without slough.  Wound Description (Comments): red ulceration beneath ETT holder on L cheek  Present on Admission: Yes  Dressing Type Foam - Lift dressing to assess site every shift 01/26/22 0845     Pressure Injury 01/16/22 Buttocks Left (Active)  01/16/22 1230  Location: Buttocks  Location Orientation: Left  Staging:   Wound Description (Comments):   Present on Admission: Yes  Dressing Type Foam - Lift dressing to assess site every shift 02/01/22 2036   Subjective: Seen in HD, able to communicate. Feels reassured we are watching her over the weekend, still has feeling of abdominal "swelling" when she eats anything and doesn't want to try due to that. Having nausea currently, but has tolerated goal rate TF's x12 hours. Denies any urinary symptoms at all inclusive of dysuria, urinary frequency, urgency, hesitancy.   Objective: Vitals:   02/02/22 1130 02/02/22 1144 02/02/22 1255 02/02/22 1340  BP: 117/67 126/83 100/83 112/62  Pulse: 80 77 77 61  Resp: '17 19 18   '$ Temp:  (!) 97.2 F (36.2 C) 98.1 F (36.7 C)   TempSrc:  Oral Oral   SpO2: 100% 95% 96%   Weight:  105.4 kg    Height:       Gen: 76 y.o. female in no distress, tired-appearing. Pulm: Nonlabored breathing at rest, no  crackles. CV: Regular rate and rhythm. No murmur, rub, or gallop. No JVD, stable lymphedema. GI: Abdomen soft, not significantly tender at all, non-distended, with normoactive bowel sounds. No suprapubic fullness or tenderness. Ext: Warm, no deformities Skin: Left TDC in use, nontender without discharge. No new rashes, lesions or ulcers on visualized skin. Neuro: Alert and oriented. HOH stable without any other focal neurological deficits. Psych: Judgement and insight appear fair. Mood euthymic & affect congruent. Behavior is appropriate.    Data Personally reviewed: CBC: Recent Labs  Lab 01/27/22 0536 01/28/22 0426 01/31/22 0500 02/01/22 0500 02/02/22 0521  WBC 7.6  --  6.3 6.3 6.6  HGB 8.5* 8.6* 8.3* 8.2* 8.2*  HCT 27.4* 27.3* 27.2* 26.8* 25.9*  MCV 89.5  --  92.2 92.4 91.5  PLT 519*  --  368 320 008   Basic Metabolic Panel: Recent Labs  Lab 01/27/22 0536 01/28/22 0426 01/29/22 0607 01/30/22 0852 01/31/22 0500 02/01/22 1047 02/02/22 0521  NA 140 137 140 139 137 139 138  K 3.8 3.7 4.2 3.9 3.7 3.8 3.7  CL 99 101 101 104 102 103 103  CO2 '29 29 29 29 30 30 29  '$ GLUCOSE 113* 116* 104* 119* 106* 123*  141*  BUN 41* 45* 51* 60* 41* 55* 66*  CREATININE 1.72* 2.00* 2.20* 2.43* 1.82* 2.20* 2.23*  CALCIUM 8.4* 8.2* 8.5* 8.2* 8.1* 8.1* 8.1*  MG 1.9 2.1  --   --  1.8  --   --   PHOS 2.9 3.2 3.8 3.6 2.4* 2.3* 2.1*   GFR: Estimated Creatinine Clearance: 25.8 mL/min (A) (by C-G formula based on SCr of 2.23 mg/dL (H)). Liver Function Tests: Recent Labs  Lab 01/28/22 0426 01/29/22 0607 01/30/22 0852 01/31/22 0500 02/01/22 1047 02/02/22 0521  AST 16  --   --  20  --   --   ALT 17  --   --  16  --   --   ALKPHOS 113  --   --  160*  --   --   BILITOT 0.6  --   --  0.4  --   --   PROT 5.5*  --   --  5.4*  --   --   ALBUMIN 1.9* 1.9* 1.7* 1.8* 1.7* 1.8*   No results for input(s): "LIPASE", "AMYLASE" in the last 168 hours. No results for input(s): "AMMONIA" in the last 168  hours. Coagulation Profile: No results for input(s): "INR", "PROTIME" in the last 168 hours. Cardiac Enzymes: No results for input(s): "CKTOTAL", "CKMB", "CKMBINDEX", "TROPONINI" in the last 168 hours. BNP (last 3 results) No results for input(s): "PROBNP" in the last 8760 hours. HbA1C: No results for input(s): "HGBA1C" in the last 72 hours. CBG: Recent Labs  Lab 02/01/22 1128 02/01/22 1646 02/01/22 2044 02/02/22 0019 02/02/22 0522  GLUCAP 107* 112* 129* 146* 140*   Lipid Profile: Recent Labs    01/31/22 0500  TRIG 51   Thyroid Function Tests: No results for input(s): "TSH", "T4TOTAL", "FREET4", "T3FREE", "THYROIDAB" in the last 72 hours. Anemia Panel: No results for input(s): "VITAMINB12", "FOLATE", "FERRITIN", "TIBC", "IRON", "RETICCTPCT" in the last 72 hours. Urine analysis:    Component Value Date/Time   COLORURINE YELLOW 01/11/2022 1051   APPEARANCEUR CLOUDY (A) 01/11/2022 1051   APPEARANCEUR Clear 03/01/2021 1257   LABSPEC 1.010 01/11/2022 1051   PHURINE 5.0 01/11/2022 1051   GLUCOSEU NEGATIVE 01/11/2022 1051   HGBUR SMALL (A) 01/11/2022 1051   BILIRUBINUR NEGATIVE 01/11/2022 1051   BILIRUBINUR Negative 03/01/2021 [place] 01/11/2022 1051   PROTEINUR NEGATIVE 01/11/2022 1051   NITRITE NEGATIVE 01/11/2022 1051   LEUKOCYTESUR LARGE (A) 01/11/2022 1051   No results found for this or any previous visit (from the past 240 hour(s)).   No results found.  Family Communication: Son in room  Disposition: Status is: Inpatient Remains inpatient appropriate because: We need to monitor for tube feed tolerance to develop nutrition/hydration plan going forward. Once plan in place, could discharge to [facility] 9/11.  Planned Discharge Destination: LTAC  Patrecia Pour, MD 02/02/2022 3:59 PM Page by [email or web address]

## 2022-02-02 NOTE — Progress Notes (Signed)
To note, patient had 2 very large quantity mushy/liquid foul smelling diarrheas today despite taking Imodium.

## 2022-02-03 DIAGNOSIS — A419 Sepsis, unspecified organism: Secondary | ICD-10-CM | POA: Diagnosis not present

## 2022-02-03 DIAGNOSIS — Z515 Encounter for palliative care: Secondary | ICD-10-CM | POA: Diagnosis not present

## 2022-02-03 DIAGNOSIS — N179 Acute kidney failure, unspecified: Secondary | ICD-10-CM | POA: Diagnosis not present

## 2022-02-03 DIAGNOSIS — K56609 Unspecified intestinal obstruction, unspecified as to partial versus complete obstruction: Secondary | ICD-10-CM | POA: Diagnosis not present

## 2022-02-03 DIAGNOSIS — J9601 Acute respiratory failure with hypoxia: Secondary | ICD-10-CM | POA: Diagnosis not present

## 2022-02-03 LAB — HEPARIN LEVEL (UNFRACTIONATED): Heparin Unfractionated: 0.33 IU/mL (ref 0.30–0.70)

## 2022-02-03 LAB — CBC
HCT: 26.9 % — ABNORMAL LOW (ref 36.0–46.0)
Hemoglobin: 8.1 g/dL — ABNORMAL LOW (ref 12.0–15.0)
MCH: 28.4 pg (ref 26.0–34.0)
MCHC: 30.1 g/dL (ref 30.0–36.0)
MCV: 94.4 fL (ref 80.0–100.0)
Platelets: 302 10*3/uL (ref 150–400)
RBC: 2.85 MIL/uL — ABNORMAL LOW (ref 3.87–5.11)
RDW: 18.1 % — ABNORMAL HIGH (ref 11.5–15.5)
WBC: 6.3 10*3/uL (ref 4.0–10.5)
nRBC: 0 % (ref 0.0–0.2)

## 2022-02-03 LAB — RENAL FUNCTION PANEL
Albumin: 1.7 g/dL — ABNORMAL LOW (ref 3.5–5.0)
Anion gap: 5 (ref 5–15)
BUN: 45 mg/dL — ABNORMAL HIGH (ref 8–23)
CO2: 30 mmol/L (ref 22–32)
Calcium: 7.8 mg/dL — ABNORMAL LOW (ref 8.9–10.3)
Chloride: 101 mmol/L (ref 98–111)
Creatinine, Ser: 1.69 mg/dL — ABNORMAL HIGH (ref 0.44–1.00)
GFR, Estimated: 31 mL/min — ABNORMAL LOW (ref >60)
Glucose, Bld: 110 mg/dL — ABNORMAL HIGH (ref 70–99)
Phosphorus: 2.5 mg/dL (ref 2.5–4.6)
Potassium: 3.8 mmol/L (ref 3.5–5.1)
Sodium: 136 mmol/L (ref 135–145)

## 2022-02-03 LAB — GLUCOSE, CAPILLARY
Glucose-Capillary: 104 mg/dL — ABNORMAL HIGH (ref 70–99)
Glucose-Capillary: 111 mg/dL — ABNORMAL HIGH (ref 70–99)
Glucose-Capillary: 121 mg/dL — ABNORMAL HIGH (ref 70–99)
Glucose-Capillary: 122 mg/dL — ABNORMAL HIGH (ref 70–99)
Glucose-Capillary: 95 mg/dL (ref 70–99)

## 2022-02-03 MED ORDER — DARBEPOETIN ALFA 100 MCG/0.5ML IJ SOSY: 100.0000 ug | PREFILLED_SYRINGE | INTRAMUSCULAR | Status: DC

## 2022-02-03 MED ORDER — CHLORHEXIDINE GLUCONATE CLOTH 2 % EX PADS: 6.0000 | MEDICATED_PAD | Freq: Every day | CUTANEOUS | Status: DC

## 2022-02-03 MED ORDER — LEVOTHYROXINE SODIUM 50 MCG PO TABS
50.0000 ug | ORAL_TABLET | Freq: Every day | ORAL | Status: DC
  Administered 2022-02-03 – 2022-02-04 (×2): 50 ug via ORAL
  Filled 2022-02-03 (×2): qty 1

## 2022-02-03 NOTE — Progress Notes (Signed)
[place] for Heparin Indication: atrial fibrillation  Allergies  Allergen Reactions   Celebrex [Celecoxib] Swelling   Keflet [Cephalexin] Swelling   Penicillins Hives and Swelling    DID THE REACTION INVOLVE: Swelling of the face/tongue/throat, SOB, or low BP? Yes-swelling-hives Sudden or severe rash/hives, skin peeling, or the inside of the mouth or nose? Unknown Did it require medical treatment? Unknown When did it last happen?    over 10 years   If all above answers are "NO", may proceed with cephalosporin use.    Sulfa Antibiotics Swelling   Kenalog [Triamcinolone Acetonide]     unknown   Latex    Lisinopril Other (See Comments)    unknown   Mobic [Meloxicam]     SWELLING    Patient Measurements: Height: '5\' 4"'$  (162.6 cm) Weight: 106.3 kg (234 lb 5.6 oz) IBW/kg (Calculated) : 54.7 HEPARIN DW (KG): 80   Heparin Dosing Weight: 72 kg  Vital Signs: Temp: 98.7 F (37.1 C) (09/10 0426) Temp Source: Oral (09/10 0426) BP: 117/60 (09/10 0426) Pulse Rate: 70 (09/10 0426)  Labs: Recent Labs    02/01/22 0500 02/01/22 1047 02/01/22 1048 02/01/22 1935 02/02/22 0521 02/03/22 0604  HGB 8.2*  --   --   --  8.2* 8.1*  HCT 26.8*  --   --   --  25.9* 26.9*  PLT 320  --   --   --  313 302  HEPARINUNFRC  --   --    < > 0.36 0.35 0.33  CREATININE  --  2.20*  --   --  2.23* 1.69*   < > = values in this interval not displayed.    Estimated Creatinine Clearance: 34.2 mL/min (A) (by C-G formula based on SCr of 1.69 mg/dL (H)).   Assessment: Pharmacy consulted to dose heparin infusion for post-op afib. Patient was not on any AC PTA.  She is noted s/p ex lap and on TPN.  Cortrack placed 9/5 and tube feed started -plans are for apixaban as long as she tolerates tube feed  Heparin level therapeutic at 0.33. No issues with infusion reported, CBC stable, no signs or symptoms of bleeding noted.  Goal of Therapy:  Heparin level 0.3-0.7  units/ml Monitor platelets by anticoagulation protocol: Yes   Plan:  Increase heparin gtt to 1800 units/hr Monitor daily heparin level and CBC Monitor for sign and symptoms of bleeding  Louanne Belton, PharmD, [facility] PGY1 Pharmacy Resident 02/03/2022 8:31 AM

## 2022-02-03 NOTE — Progress Notes (Signed)
Progress Note  Patient: [name] CMK:[id number] DOB: 24-Dec-1945  DOA: 01/07/2022  DOS: 02/03/2022    Brief hospital course: 76 year old F with PMH of PE, HTN, HLD, hypothyroidism, osteoarthritis and depression returned to [facility] ED with nausea, vomiting and diarrhea and found to small bowel obstruction.  She then developed AKI and septic shock and underwent emergent ex lap with lysis of adhesion on 8/17.  Postoperatively, she remained intubated, started on CRRT and transferred to [facility] on 8/19.  Eventually extubated on 8/21, and started on TNA.  She is started intermittent HD on 8/23.    Hospital course complicated by A-fib with RVR and diarrhea.  Cardiology consulted and she was started on IV amiodarone for A-fib with RVR.  In regards to diarrhea, C. difficile negative.  GIP positive for STEC.    Patient had repeat CT abdomen as part of evaluation for worsening abdominal pain that was negative for abscess but moderate bilateral pleural effusion.  She underwent right thoracocentesis with removal of 1.1 L on 8/25.   General surgery and nephrology following.  SLP recommended dysphagia 3 diet after FEES on 8/30. IR placed cortrack after unsuccessful attempt by RD on 9/5.  Tube feeding started on 9/6 though patient having difficulty advancing to goal rate. [place] placed on 9/7, HD 9/9.   Assessment and Plan: Septic shock in the setting of mall bowel obstruction and intra-abdominal infection/STEC: Resolved. - Completed antibiotic course with aztreonam and metronidazole on 8/27 - Repeat CT on 8/24 negative for abscess and decreased bowel distention   SBO status post laparotomy 8/17: Having regular BMs. Dysphagia-SLP recommended dysphagia 3 diet. Decreased oral intake - IR placed cortrack after unsuccessful attempt by RD on 9/5.  Tube feeding started on 9/6.  - Hold TPN. Hopeful her ability/willingness to take po will improve with more recovery time since laparotomy. If not, could have PEG  placed at [facility] as well. Palliative care has been involved, though discussions clearly worsen patient's anxiety.    Acute Metabolic Encephalopathy: In the setting of septic shock and SBO: Resolved.  - Monitor with repeat HD 9/9. She is oriented.   Paroxysmal A-fib with RVR/PVCs: Currently rate controlled. -Continue metoprolol and amiodarone -On IV heparin for anticoagulation.  Transition to enteral eliquis on discharge. -Cardiology signed off.   Acute hypoxic respiratory failure/pulmonary edema/bilateral pleural effusion: Improving. - S/p right thoracocentesis on 8/25 by PCCM - Fluid management by HD per nephrology, improving with UF - Encourage IS.    Oliguric AKI: Likely from ATN in the setting of sepsis. CRRT discontinued on 8/21. IHD started on 8/23. - Repeating HD 9/9 thru [place] placed on 9/7. - Made 300cc/24hrs, 2L UF yesterday. Planning MWF schedule for now, repeating lasix today. D/w Dr. Royce Macadamia who confirms patient can be rounded on by nephrology at [facility] regularly to monitor for renal recovery there.  - Avoid nephrotoxic meds   Iron deficiency anemia superimposed on anemia of chronic disease: Transfused on 8/21 and 9/2. - ESA weekly per nephrology, increasing dose - Monitor for bleeding   Hyponatremia, hypokalemia, hypophosphatemia: Resolved. - Monitor and correct as appropriate   Abdominal pain/diarrhea/loose stool: Abdominal exam benign.  No fever or leukocytosis to suggest infection. - Added Bentyl 10 mg 3 times daily AC   Leukocytosis: Likely demargination.  Resolved.   Chronic lymphedema: - Wrapped per PT - Lower extremity pumps daily x1 hour.   Morbid obesity: Body mass index is 40.23 kg/m.  Pressure injury: POA. Treat by offloading and optimizing nutritional status.  Pressure Injury 01/10/22 Coccyx Medial;Mid Stage 2 -  Partial thickness loss of dermis presenting as a shallow open injury with a red, pink wound bed without slough. pink (Active)  01/10/22 1200   Location: Coccyx  Location Orientation: Medial;Mid  Staging: Stage 2 -  Partial thickness loss of dermis presenting as a shallow open injury with a red, pink wound bed without slough.  Wound Description (Comments): pink  Present on Admission:   Dressing Type Foam - Lift dressing to assess site every shift 02/02/22 2108     Pressure Injury 01/12/22 Face Left Stage 2 -  Partial thickness loss of dermis presenting as a shallow open injury with a red, pink wound bed without slough. red ulceration beneath ETT holder on L cheek (Active)  01/12/22 1815  Location: Face  Location Orientation: Left  Staging: Stage 2 -  Partial thickness loss of dermis presenting as a shallow open injury with a red, pink wound bed without slough.  Wound Description (Comments): red ulceration beneath ETT holder on L cheek  Present on Admission: Yes  Dressing Type Foam - Lift dressing to assess site every shift 01/26/22 0845     Pressure Injury 01/16/22 Buttocks Left (Active)  01/16/22 1230  Location: Buttocks  Location Orientation: Left  Staging:   Wound Description (Comments):   Present on Admission: Yes  Dressing Type Foam - Lift dressing to assess site every shift 02/01/22 2036   Subjective: Anxious about leaving the hospital, still has nausea and feels it worsens with abdominal bloating sensation when she eats. Has been like that, very minimally improved since she was admitted. No other complaints at this time.   Objective: Vitals:   02/02/22 1340 02/02/22 2108 02/03/22 0426 02/03/22 1117  BP: 112/62 120/75 117/60 (!) 143/92  Pulse: 61 82 70 92  Resp:  '19 19 18  '$ Temp:  98.2 F (36.8 C) 98.7 F (37.1 C) 98 F (36.7 C)  TempSrc:  Oral Oral Oral  SpO2:  96% 100% 98%  Weight:   106.3 kg   Height:       Gen: No distress  Pulm: Nonlabored breathing room air. Clear. CV: Regular rate and rhythm. No murmur, rub, or gallop. No JVD, stable lymphedema. GI: Abdomen soft, non-tender, non-distended, with  normoactive bowel sounds. Very minimal appropriate tenderness in midline incision without exudate or bleeding. Ext: Warm, dry Skin: No new rashes, lesions or ulcers on visualized skin. Neuro: Alert and oriented. HOH. No new focal neurological deficits. Psych: Judgement and insight appear fair. Mood anxious & affect congruent. Behavior is appropriate.    Data Personally reviewed: CBC: Recent Labs  Lab 01/28/22 0426 01/31/22 0500 02/01/22 0500 02/02/22 0521 02/03/22 0604  WBC  --  6.3 6.3 6.6 6.3  HGB 8.6* 8.3* 8.2* 8.2* 8.1*  HCT 27.3* 27.2* 26.8* 25.9* 26.9*  MCV  --  92.2 92.4 91.5 94.4  PLT  --  368 320 313 270   Basic Metabolic Panel: Recent Labs  Lab 01/28/22 0426 01/29/22 0607 01/30/22 0852 01/31/22 0500 02/01/22 1047 02/02/22 0521 02/03/22 0604  NA 137   < > 139 137 139 138 136  K 3.7   < > 3.9 3.7 3.8 3.7 3.8  CL 101   < > 104 102 103 103 101  CO2 29   < > '29 30 30 29 30  '$ GLUCOSE 116*   < > 119* 106* 123* 141* 110*  BUN 45*   < > 60* 41* 55* 66* 45*  CREATININE 2.00*   < >  2.43* 1.82* 2.20* 2.23* 1.69*  CALCIUM 8.2*   < > 8.2* 8.1* 8.1* 8.1* 7.8*  MG 2.1  --   --  1.8  --   --   --   PHOS 3.2   < > 3.6 2.4* 2.3* 2.1* 2.5   < > = values in this interval not displayed.   GFR: Estimated Creatinine Clearance: 34.2 mL/min (A) (by C-G formula based on SCr of 1.69 mg/dL (H)). Liver Function Tests: Recent Labs  Lab 01/28/22 0426 01/29/22 0607 01/30/22 0852 01/31/22 0500 02/01/22 1047 02/02/22 0521 02/03/22 0604  AST 16  --   --  20  --   --   --   ALT 17  --   --  16  --   --   --   ALKPHOS 113  --   --  160*  --   --   --   BILITOT 0.6  --   --  0.4  --   --   --   PROT 5.5*  --   --  5.4*  --   --   --   ALBUMIN 1.9*   < > 1.7* 1.8* 1.7* 1.8* 1.7*   < > = values in this interval not displayed.   Urine analysis:    Component Value Date/Time   COLORURINE YELLOW 01/11/2022 1051   APPEARANCEUR CLOUDY (A) 01/11/2022 1051   APPEARANCEUR Clear 03/01/2021  1257   LABSPEC 1.010 01/11/2022 1051   PHURINE 5.0 01/11/2022 1051   GLUCOSEU NEGATIVE 01/11/2022 1051   HGBUR SMALL (A) 01/11/2022 1051   BILIRUBINUR NEGATIVE 01/11/2022 1051   BILIRUBINUR Negative 03/01/2021 [place] 01/11/2022 1051   PROTEINUR NEGATIVE 01/11/2022 1051   NITRITE NEGATIVE 01/11/2022 1051   LEUKOCYTESUR LARGE (A) 01/11/2022 1051   No results found for this or any previous visit (from the past 240 hour(s)).   No results found.  Family Communication: None in room this AM.  Disposition: Status is: Inpatient Remains inpatient appropriate because: We need to monitor for tube feed tolerance to develop nutrition/hydration plan going forward. Once plan in place, could discharge to [facility] 9/11.  Planned Discharge Destination: LTAC  Patrecia Pour, MD 02/03/2022 1:47 PM Page by [email or web address]

## 2022-02-03 NOTE — Progress Notes (Signed)
   Palliative Medicine Inpatient Follow Up Note HPI: [name]  is a 76 yo female with a PMHx: COPD, HLD, HTN, PE, hypothyroidism, OA and depression. Admitted 8/14 to [facility] with vomiting and diarrhea with partial SBO. 8/17 intubated and ex lap. 8/19 pt with worsening renal function, transfer to [facility] and CRRT started. 8/21 extubation with CRRT off.  Palliative care has been asked to get involved to have goals of care conversations in the setting of a prolonged and complicated hospitalization.  Today's Discussion 02/03/2022  *Please note that this is a verbal dictation therefore any spelling or grammatical errors are due to the "[place] One" system interpretation.  Chart reviewed inclusive of vital signs, progress notes, laboratory results, and diagnostic images.    I met with [name] at bedside this morning. She still experiences incremental nausea. She remains to have very little appetite which has been ongoing since she has been hospitalized. I continue to ask patient to consider if she would desire a long term feeding tube for nutrition. Discussed concerns moving forward if patient should not improve the way she hopes, this conversation appeared to increase anxiety therefore was stopped prematurely.   Discussed plan for transition to [facility] possibly tomorrow. Patient vocalizes awareness.   Patient family was not at bedside this morning though I plan to call son, [name] later in the day to update him.  Palliative support provided.  Objective Assessment: Vital Signs Vitals:   02/03/22 0426 02/03/22 1117  BP: 117/60 (!) 143/92  Pulse: 70 92  Resp: 19 18  Temp: 98.7 F (37.1 C) 98 F (36.7 C)  SpO2: 100% 98%    Intake/Output Summary (Last 24 hours) at 02/03/2022 1312 Last data filed at 02/03/2022 0428 Gross per 24 hour  Intake 1576.49 ml  Output 350 ml  Net 1226.49 ml    Last Weight  Most recent update: 02/03/2022  4:31 AM    Weight  106.3 kg (234 lb 5.6 oz)             Gen: Elderly Caucasian female HEENT: moist mucous membranes CV: Regular rate and rhythm  PULM:  On 2LPM [place], breathing is even and nonlabored ABD: soft/nontender  EXT: (+) LE edema  Neuro: Alert and oriented x2-3  SUMMARY OF RECOMMENDATIONS   DNAR/DNI  Generalized weakness --> PT/OT, Needs daily mobility even via lift. Has lymphedema machine which has helped with swelling and allowed more mobility  Anxiety --> on alprazolam Q6H PRN  FTT --> Dietary is involved and patient has a coretrack  in place   Patient's goals are for improvement --> Best case and worst case conversation have been had in great detail  TOC - OP Palliative support on discharge --> Will transition to [facility] once optimized, possibly Monday 9/11   Incremental palliative care support  Billing based on MDM: High ______________________________________________________________________________________ [place] Team Team Cell Phone: [phone] Please utilize secure chat with additional questions, if there is no response within 30 minutes please call the above phone number  Palliative Medicine Team providers are available by phone from 7am to 7pm daily and can be reached through the team cell phone.  Should this patient require assistance outside of these hours, please call the patient's attending physician.

## 2022-02-03 NOTE — Progress Notes (Signed)
[facility] Progress Note  Name: [name] MRN: [id number] DOB: 05-28-1945   Subjective:  She had 350 mL uop over 9/9 charted.  She last had HD on 9/9 with 2 kg UF.  Still feels tired.  Spoke with her son at bedside.  She may be going to [employer].      Review of systems:    Not taking much po; still has nasal feeding tube  Breathing "about the same" - sometimes short of breath  Hearing device working again so easier for her to understand      Intake/Output Summary (Last 24 hours) at 02/03/2022 1148 Last data filed at 02/03/2022 0428 Gross per 24 hour  Intake 1576.49 ml  Output 350 ml  Net 1226.49 ml     Vitals:  Vitals:   02/02/22 1340 02/02/22 2108 02/03/22 0426 02/03/22 1117  BP: 112/62 120/75 117/60 (!) 143/92  Pulse: 61 82 70 92  Resp:  '19 19 18  '$ Temp:  98.2 F (36.8 C) 98.7 F (37.1 C) 98 F (36.7 C)  TempSrc:  Oral Oral Oral  SpO2:  96% 100% 98%  Weight:   106.3 kg   Height:         Physical Exam:        General elderly female in bed in no acute distress HEENT normocephalic atraumatic extraocular movements intact sclera anicteric Neck supple trachea midline Lungs clear but reduced to auscultation bilaterally normal work of breathing at rest on oxygen 1 liter Heart S1S2 no rub Abdomen soft nontender nondistended Extremities 1+ edema; one leg is in a compression device and one leg is out; arms are wrapped and per son's assessment much better than they once were   Psych normal mood and affect  Neuro very hard of hearing; awake on arrival and conversant  Access left IJ tunneled catheter in place   Medications reviewed   Labs:     Latest Ref Rng & Units 02/03/2022    6:04 AM 02/02/2022    5:21 AM 02/01/2022   10:47 AM  BMP  Glucose 70 - 99 mg/dL 110  141  123   BUN 8 - 23 mg/dL 45  66  55   Creatinine 0.44 - 1.00 mg/dL 1.69  2.23  2.20   Sodium 135 - 145 mmol/L 136  138  139   Potassium 3.5 - 5.1 mmol/L 3.8  3.7  3.8   Chloride 98  - 111 mmol/L 101  103  103   CO2 22 - 32 mmol/L '30  29  30   '$ Calcium 8.9 - 10.3 mg/dL 7.8  8.1  8.1      Assessment/Plan:   Pt is a 76 y.o. yo female with history of HTN, HLD, hypothyroidism, PE, presented with SBO underwent emergent ex lap with lysis of adhesion on 8/17, developed anterior dependent respiratory failure, AKI requiring CRRT.   #Acute kidney injury, oligoanuric, dialysis dependent:  - Likely ischemic ATN from shock/sepsis.  She was on ARB prior to admission.  Baseline Cr less than 1.  Initially required CRRT which was stopped on 8/21 and initiated IHD on 8/23.  Seen by palliative care team.  She had a tunneled catheter LIJ with IR on 9/7 - HD per MWF schedule, to continue at [facility] if she is to be transferred there.  In the event that she is discharged there tomorrow I will do HD first shift - She is getting lasix today  - monitoring for renal recovery  #Septic  shock resolved.   #High-grade small bowel obstruction now status post ex lap and lysis of adhesion.  On feeds and off of TPN.  per primary and general surgery  # Acute hypoxic respiratory failure - optimize volume status with HD    #Hyponatremia, hypervolemic: resolved   #Anemia of critical illness: Received a unit of blood transfusion on 8/29 and on 9/2.  iron deficiency - s/p repletion.  Started aranesp 60 mcg weekly on Tuesdays (started 9/5). Will increase to 100 mcg on 9/12 dosing.  Transfuse PRBC's as needed.  # Hypophosphatemia - repleted   Dispo - acceptable for transition to [facility] per primary team discretion from a strictly renal standpoint.  She has dialysis-dependent AKI (we have not been able to wean HD)    Claudia Desanctis, MD 02/03/2022 12:17 PM

## 2022-02-04 DIAGNOSIS — J9691 Respiratory failure, unspecified with hypoxia: Secondary | ICD-10-CM | POA: Diagnosis not present

## 2022-02-04 DIAGNOSIS — I480 Paroxysmal atrial fibrillation: Secondary | ICD-10-CM | POA: Diagnosis not present

## 2022-02-04 DIAGNOSIS — K9171 Accidental puncture and laceration of a digestive system organ or structure during a digestive system procedure: Secondary | ICD-10-CM | POA: Diagnosis not present

## 2022-02-04 DIAGNOSIS — E785 Hyperlipidemia, unspecified: Secondary | ICD-10-CM | POA: Diagnosis not present

## 2022-02-04 DIAGNOSIS — J90 Pleural effusion, not elsewhere classified: Secondary | ICD-10-CM | POA: Diagnosis not present

## 2022-02-04 DIAGNOSIS — F331 Major depressive disorder, recurrent, moderate: Secondary | ICD-10-CM | POA: Diagnosis not present

## 2022-02-04 DIAGNOSIS — R112 Nausea with vomiting, unspecified: Secondary | ICD-10-CM | POA: Diagnosis not present

## 2022-02-04 DIAGNOSIS — I5032 Chronic diastolic (congestive) heart failure: Secondary | ICD-10-CM | POA: Diagnosis not present

## 2022-02-04 DIAGNOSIS — Z515 Encounter for palliative care: Secondary | ICD-10-CM | POA: Diagnosis not present

## 2022-02-04 DIAGNOSIS — M810 Age-related osteoporosis without current pathological fracture: Secondary | ICD-10-CM | POA: Diagnosis not present

## 2022-02-04 DIAGNOSIS — J95821 Acute postprocedural respiratory failure: Secondary | ICD-10-CM | POA: Diagnosis not present

## 2022-02-04 DIAGNOSIS — I890 Lymphedema, not elsewhere classified: Secondary | ICD-10-CM | POA: Diagnosis not present

## 2022-02-04 DIAGNOSIS — R1310 Dysphagia, unspecified: Secondary | ICD-10-CM | POA: Diagnosis not present

## 2022-02-04 DIAGNOSIS — E1122 Type 2 diabetes mellitus with diabetic chronic kidney disease: Secondary | ICD-10-CM | POA: Diagnosis not present

## 2022-02-04 DIAGNOSIS — K219 Gastro-esophageal reflux disease without esophagitis: Secondary | ICD-10-CM | POA: Diagnosis not present

## 2022-02-04 DIAGNOSIS — J690 Pneumonitis due to inhalation of food and vomit: Secondary | ICD-10-CM | POA: Diagnosis not present

## 2022-02-04 DIAGNOSIS — N170 Acute kidney failure with tubular necrosis: Secondary | ICD-10-CM | POA: Diagnosis not present

## 2022-02-04 DIAGNOSIS — N390 Urinary tract infection, site not specified: Secondary | ICD-10-CM | POA: Diagnosis not present

## 2022-02-04 DIAGNOSIS — I10 Essential (primary) hypertension: Secondary | ICD-10-CM | POA: Diagnosis not present

## 2022-02-04 DIAGNOSIS — D631 Anemia in chronic kidney disease: Secondary | ICD-10-CM | POA: Diagnosis not present

## 2022-02-04 DIAGNOSIS — E039 Hypothyroidism, unspecified: Secondary | ICD-10-CM | POA: Diagnosis not present

## 2022-02-04 DIAGNOSIS — Z743 Need for continuous supervision: Secondary | ICD-10-CM | POA: Diagnosis not present

## 2022-02-04 DIAGNOSIS — R627 Adult failure to thrive: Secondary | ICD-10-CM | POA: Diagnosis not present

## 2022-02-04 DIAGNOSIS — N185 Chronic kidney disease, stage 5: Secondary | ICD-10-CM | POA: Diagnosis not present

## 2022-02-04 DIAGNOSIS — Z86711 Personal history of pulmonary embolism: Secondary | ICD-10-CM | POA: Diagnosis not present

## 2022-02-04 DIAGNOSIS — R531 Weakness: Secondary | ICD-10-CM | POA: Diagnosis not present

## 2022-02-04 DIAGNOSIS — T82598A Other mechanical complication of other cardiac and vascular devices and implants, initial encounter: Secondary | ICD-10-CM | POA: Diagnosis not present

## 2022-02-04 DIAGNOSIS — N179 Acute kidney failure, unspecified: Secondary | ICD-10-CM | POA: Diagnosis not present

## 2022-02-04 DIAGNOSIS — R1311 Dysphagia, oral phase: Secondary | ICD-10-CM | POA: Diagnosis not present

## 2022-02-04 DIAGNOSIS — G9341 Metabolic encephalopathy: Secondary | ICD-10-CM | POA: Diagnosis not present

## 2022-02-04 DIAGNOSIS — I4819 Other persistent atrial fibrillation: Secondary | ICD-10-CM | POA: Diagnosis not present

## 2022-02-04 DIAGNOSIS — J9811 Atelectasis: Secondary | ICD-10-CM | POA: Diagnosis not present

## 2022-02-04 DIAGNOSIS — R1312 Dysphagia, oropharyngeal phase: Secondary | ICD-10-CM | POA: Diagnosis not present

## 2022-02-04 DIAGNOSIS — A419 Sepsis, unspecified organism: Secondary | ICD-10-CM | POA: Diagnosis not present

## 2022-02-04 DIAGNOSIS — J449 Chronic obstructive pulmonary disease, unspecified: Secondary | ICD-10-CM | POA: Diagnosis not present

## 2022-02-04 DIAGNOSIS — I132 Hypertensive heart and chronic kidney disease with heart failure and with stage 5 chronic kidney disease, or end stage renal disease: Secondary | ICD-10-CM | POA: Diagnosis not present

## 2022-02-04 DIAGNOSIS — I110 Hypertensive heart disease with heart failure: Secondary | ICD-10-CM | POA: Diagnosis not present

## 2022-02-04 DIAGNOSIS — E440 Moderate protein-calorie malnutrition: Secondary | ICD-10-CM | POA: Diagnosis not present

## 2022-02-04 DIAGNOSIS — R41841 Cognitive communication deficit: Secondary | ICD-10-CM | POA: Diagnosis not present

## 2022-02-04 DIAGNOSIS — Y658 Other specified misadventures during surgical and medical care: Secondary | ICD-10-CM | POA: Diagnosis not present

## 2022-02-04 DIAGNOSIS — Z4659 Encounter for fitting and adjustment of other gastrointestinal appliance and device: Secondary | ICD-10-CM | POA: Diagnosis not present

## 2022-02-04 DIAGNOSIS — E559 Vitamin D deficiency, unspecified: Secondary | ICD-10-CM | POA: Diagnosis not present

## 2022-02-04 DIAGNOSIS — Z992 Dependence on renal dialysis: Secondary | ICD-10-CM | POA: Diagnosis not present

## 2022-02-04 DIAGNOSIS — Z66 Do not resuscitate: Secondary | ICD-10-CM | POA: Diagnosis not present

## 2022-02-04 DIAGNOSIS — R109 Unspecified abdominal pain: Secondary | ICD-10-CM | POA: Diagnosis not present

## 2022-02-04 DIAGNOSIS — R6521 Severe sepsis with septic shock: Secondary | ICD-10-CM | POA: Diagnosis not present

## 2022-02-04 DIAGNOSIS — M6281 Muscle weakness (generalized): Secondary | ICD-10-CM | POA: Diagnosis not present

## 2022-02-04 DIAGNOSIS — K5651 Intestinal adhesions [bands], with partial obstruction: Secondary | ICD-10-CM | POA: Diagnosis not present

## 2022-02-04 DIAGNOSIS — J9690 Respiratory failure, unspecified, unspecified whether with hypoxia or hypercapnia: Secondary | ICD-10-CM | POA: Diagnosis not present

## 2022-02-04 DIAGNOSIS — L89156 Pressure-induced deep tissue damage of sacral region: Secondary | ICD-10-CM | POA: Diagnosis not present

## 2022-02-04 DIAGNOSIS — I509 Heart failure, unspecified: Secondary | ICD-10-CM | POA: Diagnosis not present

## 2022-02-04 DIAGNOSIS — K56600 Partial intestinal obstruction, unspecified as to cause: Secondary | ICD-10-CM | POA: Diagnosis present

## 2022-02-04 DIAGNOSIS — M9907 Segmental and somatic dysfunction of upper extremity: Secondary | ICD-10-CM | POA: Diagnosis not present

## 2022-02-04 DIAGNOSIS — Z48815 Encounter for surgical aftercare following surgery on the digestive system: Secondary | ICD-10-CM | POA: Diagnosis not present

## 2022-02-04 DIAGNOSIS — F411 Generalized anxiety disorder: Secondary | ICD-10-CM | POA: Diagnosis not present

## 2022-02-04 DIAGNOSIS — R2689 Other abnormalities of gait and mobility: Secondary | ICD-10-CM | POA: Diagnosis not present

## 2022-02-04 DIAGNOSIS — N189 Chronic kidney disease, unspecified: Secondary | ICD-10-CM | POA: Diagnosis not present

## 2022-02-04 DIAGNOSIS — K56609 Unspecified intestinal obstruction, unspecified as to partial versus complete obstruction: Secondary | ICD-10-CM | POA: Diagnosis not present

## 2022-02-04 DIAGNOSIS — R5381 Other malaise: Secondary | ICD-10-CM | POA: Diagnosis not present

## 2022-02-04 DIAGNOSIS — Z6841 Body Mass Index (BMI) 40.0 and over, adult: Secondary | ICD-10-CM | POA: Diagnosis not present

## 2022-02-04 DIAGNOSIS — E7800 Pure hypercholesterolemia, unspecified: Secondary | ICD-10-CM | POA: Diagnosis not present

## 2022-02-04 DIAGNOSIS — J9601 Acute respiratory failure with hypoxia: Secondary | ICD-10-CM | POA: Diagnosis not present

## 2022-02-04 DIAGNOSIS — R262 Difficulty in walking, not elsewhere classified: Secondary | ICD-10-CM | POA: Diagnosis not present

## 2022-02-04 DIAGNOSIS — D509 Iron deficiency anemia, unspecified: Secondary | ICD-10-CM | POA: Diagnosis not present

## 2022-02-04 DIAGNOSIS — F32A Depression, unspecified: Secondary | ICD-10-CM | POA: Diagnosis not present

## 2022-02-04 DIAGNOSIS — R410 Disorientation, unspecified: Secondary | ICD-10-CM | POA: Diagnosis not present

## 2022-02-04 DIAGNOSIS — G2581 Restless legs syndrome: Secondary | ICD-10-CM | POA: Diagnosis not present

## 2022-02-04 LAB — CBC
HCT: 28 % — ABNORMAL LOW (ref 36.0–46.0)
Hemoglobin: 8.5 g/dL — ABNORMAL LOW (ref 12.0–15.0)
MCH: 28.4 pg (ref 26.0–34.0)
MCHC: 30.4 g/dL (ref 30.0–36.0)
MCV: 93.6 fL (ref 80.0–100.0)
Platelets: 318 10*3/uL (ref 150–400)
RBC: 2.99 MIL/uL — ABNORMAL LOW (ref 3.87–5.11)
RDW: 18.2 % — ABNORMAL HIGH (ref 11.5–15.5)
WBC: 5.9 10*3/uL (ref 4.0–10.5)
nRBC: 0 % (ref 0.0–0.2)

## 2022-02-04 LAB — RENAL FUNCTION PANEL
Albumin: 1.7 g/dL — ABNORMAL LOW (ref 3.5–5.0)
Anion gap: 6 (ref 5–15)
BUN: 49 mg/dL — ABNORMAL HIGH (ref 8–23)
CO2: 29 mmol/L (ref 22–32)
Calcium: 8.1 mg/dL — ABNORMAL LOW (ref 8.9–10.3)
Chloride: 101 mmol/L (ref 98–111)
Creatinine, Ser: 1.83 mg/dL — ABNORMAL HIGH (ref 0.44–1.00)
GFR, Estimated: 28 mL/min — ABNORMAL LOW (ref >60)
Glucose, Bld: 126 mg/dL — ABNORMAL HIGH (ref 70–99)
Phosphorus: 2.8 mg/dL (ref 2.5–4.6)
Potassium: 4.1 mmol/L (ref 3.5–5.1)
Sodium: 136 mmol/L (ref 135–145)

## 2022-02-04 LAB — GLUCOSE, CAPILLARY
Glucose-Capillary: 108 mg/dL — ABNORMAL HIGH (ref 70–99)
Glucose-Capillary: 122 mg/dL — ABNORMAL HIGH (ref 70–99)

## 2022-02-04 LAB — HEPARIN LEVEL (UNFRACTIONATED): Heparin Unfractionated: 0.51 IU/mL (ref 0.30–0.70)

## 2022-02-04 MED ORDER — NEPRO/CARBSTEADY PO LIQD: 237.0000 mL | Freq: Two times a day (BID) | ORAL | Status: DC

## 2022-02-04 MED ORDER — DICYCLOMINE HCL 10 MG PO CAPS: 10.0000 mg | ORAL_CAPSULE | Freq: Three times a day (TID) | ORAL | Status: DC

## 2022-02-04 MED ORDER — ALBUTEROL SULFATE (2.5 MG/3ML) 0.083% IN NEBU: 2.5000 mg | INHALATION_SOLUTION | Freq: Four times a day (QID) | RESPIRATORY_TRACT | Status: DC | PRN

## 2022-02-04 MED ORDER — GUAIFENESIN-DM 100-10 MG/5ML PO SYRP: 5.0000 mL | ORAL_SOLUTION | ORAL | Status: DC | PRN

## 2022-02-04 MED ORDER — PROSOURCE TF20 ENFIT COMPATIBL EN LIQD: 60.0000 mL | Freq: Two times a day (BID) | ENTERAL | Status: DC

## 2022-02-04 MED ORDER — FOLIC ACID 1 MG PO TABS: 1.0000 mg | ORAL_TABLET | Freq: Every day | ORAL | Status: DC

## 2022-02-04 MED ORDER — OSMOLITE 1.5 CAL PO LIQD: 1000.0000 mL | ORAL | 0 refills | Status: DC

## 2022-02-04 MED ORDER — AMIODARONE HCL 200 MG PO TABS: 200.0000 mg | ORAL_TABLET | Freq: Every day | ORAL | Status: DC

## 2022-02-04 MED ORDER — APIXABAN 5 MG PO TABS: 5.0000 mg | ORAL_TABLET | Freq: Two times a day (BID) | ORAL | Status: DC

## 2022-02-04 MED ORDER — LOPERAMIDE HCL 2 MG PO CAPS: 2.0000 mg | ORAL_CAPSULE | ORAL | Status: DC | PRN

## 2022-02-04 MED ORDER — HEPARIN SODIUM (PORCINE) 1000 UNIT/ML IJ SOLN
INTRAMUSCULAR | Status: AC
  Filled 2022-02-04: qty 4

## 2022-02-04 MED ORDER — APIXABAN 5 MG PO TABS
5.0000 mg | ORAL_TABLET | Freq: Once | ORAL | Status: AC
Start: 2022-02-04 — End: 2022-02-04
  Administered 2022-02-04: 5 mg via ORAL
  Filled 2022-02-04: qty 1

## 2022-02-04 MED ORDER — ALPRAZOLAM 0.25 MG PO TABS: 0.2500 mg | ORAL_TABLET | Freq: Four times a day (QID) | ORAL | 0 refills | Status: DC | PRN

## 2022-02-04 MED ORDER — METOPROLOL TARTRATE 25 MG PO TABS: 12.5000 mg | ORAL_TABLET | Freq: Two times a day (BID) | ORAL | Status: DC

## 2022-02-04 MED ORDER — OXYCODONE HCL 5 MG PO TABS
5.0000 mg | ORAL_TABLET | Freq: Four times a day (QID) | ORAL | 0 refills | Status: DC | PRN
Start: 2022-02-04 — End: 2022-04-12

## 2022-02-04 MED ORDER — FERROUS SULFATE 325 (65 FE) MG PO TABS: 325.0000 mg | ORAL_TABLET | Freq: Every day | ORAL | Status: DC

## 2022-02-04 NOTE — TOC Transition Note (Addendum)
Transition of Care [facility]) - CM/SW Discharge Note   Patient Details  Name: [name] MRN: [id number] Date of Birth: [birth year]  Transition of [facility] CM/SW Contact:  Bethena Roys, RN Phone Number: 02/04/2022, 11:13 AM   Clinical Narrative: Case Manager received notification from the MD that the plan will be for discharge to [place] today. Provider has spoken with son in regards to transition-son is agreeable. Case Manager also spoke with the son regarding outpatient palliative services. Son is agreeable to [facility] if the office can accept the patient. Case Manager is awaiting call from the agency for clarification. Patient will go to Room 316, Accepting MD is Dr. Manson Allan, Staff RN can call report to [phone] or [phone]. Patient will need to transition to [place] via [place]. Staff RN to make Case Manager aware once patient has completed HD.   02-04-22 1244 Awaiting determination from [facility] to see if they can service the patient for outpatient palliative services.   02-04-22 1405 Case Manager received call from the [facility] Liaison and the room has changed to # 409- Report to be called to [phone] or [phone].  1453 02-04-22 Case Manager just received notification that [facility] will discuss outpatient palliative services with the family. Family is wanting [facility] for outpatient palliative services to have the continuity of care: however, unsure if they have a contract with the agency. [place] NP is following up with the Director Rob at the facility and the Case Freight forwarder at [facility] will continue to follow- Information relayed to son, MD, and Diplomatic Services operational officer. All agreeable for the plan of care to have patient transported to [facility] and the [place] will follow for outpatient palliative needs.   1643 02-04-22 Staff RN to call [facility] for ambulance transport to [facility]. No further needs from Case  Manager at this time.   Final next level of care: Long Term Acute Care (LTAC) Barriers to Discharge: No Barriers Identified   Patient Goals and CMS Choice Patient states their goals for this hospitalization and ongoing recovery are:: plan will be for LTAC vs SNF   Discharge Plan and Services In-house Referral: Clinical Social Work Discharge Planning Services: CM Consult Post Acute Care Choice: Long Term Acute Care (LTAC)            DME Agency: NA     Readmission Risk Interventions     No data to display

## 2022-02-04 NOTE — Progress Notes (Signed)
   Palliative Medicine Inpatient Follow Up Note HPI: [name]  is a 76 yo female with a PMHx: COPD, HLD, HTN, PE, hypothyroidism, OA and depression. Admitted 8/14 to [facility] with vomiting and diarrhea with partial SBO. 8/17 intubated and ex lap. 8/19 pt with worsening renal function, transfer to [facility] and CRRT started. 8/21 extubation with CRRT off.  Palliative care has been asked to get involved to have goals of care conversations in the setting of a prolonged and complicated hospitalization.  Today's Discussion 02/04/2022  *Please note that this is a verbal dictation therefore any spelling or grammatical errors are due to the "[employer] One" system interpretation.  Chart reviewed inclusive of vital signs, progress notes, laboratory results, and diagnostic images.    I met with Jeilani at bedside this morning.  Patient's nursing staff were cleaning her up from a loose bowel movement.  Per patient's RN the patient had vocalized she was not sure she could do this any longer.  After patient was cleaned I was able to sit next to her and have a formalized conversation again about her goals moving forward.  [name] states again that she does want to continue present interventions to see if further improvements can be made.  We discussed if for any reason she should be experiencing more deficits than try amps than certainly stopping present interventions would absolutely be reasonable.  Furthermore we discussed if it anytime [name] felt the burdens of the present health care interventions were too great that we can stop more of the aggressive treatments.  [name] vocalized awareness and states that she does want to see if she can make any meaningful recovery given her very long almost 1 month hospital stay.  We reviewed further that she will likely be discharging today to [facility] where she will continue to get treatment and rehabilitation.  Palliative support  provided.  Objective Assessment: Vital Signs Vitals:   02/04/22 1100 02/04/22 1130  BP: 125/85 126/77  Pulse: 82 74  Resp: 17 (!) 22  Temp:    SpO2: 95% 94%    Intake/Output Summary (Last 24 hours) at 02/04/2022 1144 Last data filed at 02/04/2022 0400 Gross per 24 hour  Intake 1508.17 ml  Output 600 ml  Net 908.17 ml    Last Weight  Most recent update: 02/04/2022  8:25 AM    Weight  106.7 kg (235 lb 3.7 oz)            Gen: Elderly Caucasian female HEENT: moist mucous membranes CV: Regular rate and rhythm  PULM:  On 2LPM [place], breathing is even and nonlabored ABD: soft/nontender  EXT: (+) LE edema  Neuro: Alert and oriented x2-3  SUMMARY OF RECOMMENDATIONS   DNAR/DNI  TOC - OP Palliative support on discharge    Plan for transition to [facility] today  Billing based on MDM: High ______________________________________________________________________________________   [place] Palliative Medicine Team Team Cell Phone: [phone] Please utilize secure chat with additional questions, if there is no response within 30 minutes please call the above phone number  Palliative Medicine Team providers are available by phone from 7am to 7pm daily and can be reached through the team cell phone.  Should this patient require assistance outside of these hours, please call the patient's attending physician.     

## 2022-02-04 NOTE — Progress Notes (Signed)
PT Lymphedema Note  Patient with improved measurements after wrapped Friday.  She is supposed to d/c to [facility] this pm.  Spoke by phone with son about bringing her compression sleeve for the R UE to [facility] when arrives in the mail to place instead of wraps.  He plans to follow up and meet her there upon arrival.  RN also made aware need to communicate with accepting RN about wraps and plan for placing sleeve upon arrival.  All wrappings not used today placed in bag for Ms. Dysart to take with her.  PT to continue in [facility] setting.  Also encouraged son to continue to place leg pumps daily. Measurements as below.    R UE (in cm) MCP  20.3 Wrist  18.6 10 cm prox 27.5 10 cm prox  29.5 Elbow crease 28.0 10 cm prox  29.5    02/04/22 1700  PT Visit Information  Last PT Received On 02/04/22  Assistance Needed +2  History of Present Illness 76 yo female admitted 8/14 to [facility] with vomiting and diarrhea with partial SBO. 8/17 intubated and ex lap. 8/19 pt with worsening renal function, transfer to [facility] and CRRT started. 8/21 extubation with CRRT off. Pt undergoing intermittent HD. PMHx: COPD, HLD, HTN, PE, hypothyroidism, OA and depression  Precautions  Precautions Fall  Precaution Comments coretrack  Required Braces or Orthoses Other Brace  Other Brace LUE compression sleeve  Pain Assessment  Pain Assessment Faces  Faces Pain Scale 4  Pain Location generalized  Pain Descriptors / Indicators Grimacing  Pain Intervention(s) Limited activity within patient's tolerance;Monitored during session  Cognition  Arousal/Alertness Awake/alert  Behavior During Therapy Anxious  Overall Cognitive Status Impaired/Different from baseline  Area of Impairment Memory;Attention;Following commands;Problem solving  Memory Decreased short-term memory  Following Commands Follows one step commands consistently;Follows one step commands with increased time  Safety/Judgement Decreased awareness of deficits  Problem  Solving Slow processing;Requires verbal cues;Difficulty sequencing  General Comments  General comments (skin integrity, edema, etc.) completed measurements and wrapping R UE.  Spoke with son via phone regarding follow up at [facility] with sleeve when arrives in mail  Other Exercises  Other Exercises Encouraged hand, wrist and elbow movement after wrapping for improved lymphatic drainage with wraps.  PT - End of Session  Equipment Utilized During Treatment Oxygen  Activity Tolerance Patient tolerated treatment well  Patient left in bed;with call bell/phone within reach;with bed alarm set  Nurse Communication Other (comment) (need to communicate with RN at [place] about wraps)   PT - Assessment/Plan  PT Plan Current plan remains appropriate  PT Visit Diagnosis Other abnormalities of gait and mobility (R26.89);Difficulty in walking, not elsewhere classified (R26.2);Muscle weakness (generalized) (M62.81)  PT Frequency (ACUTE ONLY) Min 2X/week  Follow Up Recommendations PT at Long-term acute care hospital  Can patient physically be transported by private vehicle No  Assistance recommended at discharge Frequent or constant Supervision/Assistance  Patient can return home with the following Two people to help with walking and/or transfers;Direct supervision/assist for medications management;Two people to help with bathing/dressing/bathroom;Assist for transportation;Direct supervision/assist for financial management  PT equipment Other (comment) (TBA)  AM-PAC PT "6 Clicks" Mobility Outcome Measure (Version 2)  Help needed turning from your back to your side while in a flat bed without using bedrails? 1  Help needed moving from lying on your back to sitting on the side of a flat bed without using bedrails? 1  Help needed moving to and from a bed to a chair (including a  wheelchair)? 1  Help needed standing up from a chair using your arms (e.g., wheelchair or bedside chair)? 1  Help needed to walk in  hospital room? 1  Help needed climbing 3-5 steps with a railing?  1  6 Click Score 6  Consider Recommendation of Discharge To: CIR/SNF/LTACH  Progressive Mobility  What is the highest level of mobility based on the progressive mobility assessment? Level 1 (Bedfast) - Unable to balance while sitting on edge of bed  PT Goal Progression  Progress towards PT goals Progressing toward goals  PT Time Calculation  PT Start Time (ACUTE ONLY) 1315  PT Stop Time (ACUTE ONLY) 1350  PT Time Calculation (min) (ACUTE ONLY) 35 min  PT General Charges  $$ ACUTE PT VISIT 1 Visit  PT Treatments  $Therapeutic Activity 23-37 mins   Magda Kiel, PT Acute Rehabilitation Services Office:[phone] 02/04/2022

## 2022-02-04 NOTE — Progress Notes (Signed)
[facility] Progress Note  Name: [name] MRN: [id number] DOB: Mar 13, 1946   Subjective:  She had 618m uop charted yesterday, 350 mL uop  day prior charted.  She is currently on dialysis, tolerating well.  Plan for d/c to [facility] today.    Review of systems:    Not taking much po; still has nasal feeding tube  Breathing "about the same" - sometimes short of breath    Intake/Output Summary (Last 24 hours) at 02/04/2022 0854 Last data filed at 02/04/2022 0400 Gross per 24 hour  Intake 1508.17 ml  Output 600 ml  Net 908.17 ml      Vitals:  Vitals:   02/03/22 2102 02/04/22 0418 02/04/22 0820 02/04/22 0826  BP: 131/87 115/71 131/73 131/87  Pulse: 79 82 75 79  Resp:  18 19 (!) 21  Temp: 98.2 F (36.8 C) 97.8 F (36.6 C) 98.9 F (37.2 C)   TempSrc: Oral Oral Oral   SpO2: 98% 97% 97% 97%  Weight:  104.5 kg 106.7 kg   Height:         Physical Exam:        General elderly female in bed in no acute distress HEENT normocephalic atraumatic extraocular movements intact sclera anicteric Lungs clear but reduced to auscultation bilaterally normal work of breathing at rest on oxygen 1 liter Heart S1S2 no rub Abdomen soft nontender nondistended Extremities trace LE edema Psych normal mood and affect  Neuro very hard of hearing; awake on arrival and conversant  Access left IJ tunneled catheter in place   Medications reviewed   Labs:     Latest Ref Rng & Units 02/04/2022    3:42 AM 02/03/2022    6:04 AM 02/02/2022    5:21 AM  BMP  Glucose 70 - 99 mg/dL 126  110  141   BUN 8 - 23 mg/dL 49  45  66   Creatinine 0.44 - 1.00 mg/dL 1.83  1.69  2.23   Sodium 135 - 145 mmol/L 136  136  138   Potassium 3.5 - 5.1 mmol/L 4.1  3.8  3.7   Chloride 98 - 111 mmol/L 101  101  103   CO2 22 - 32 mmol/L '29  30  29   '$ Calcium 8.9 - 10.3 mg/dL 8.1  7.8  8.1      Assessment/Plan:   Pt is a 76y.o. yo female with history of HTN, HLD, hypothyroidism, PE, presented with SBO  underwent emergent ex lap with lysis of adhesion on 8/17, developed anterior dependent respiratory failure, AKI requiring CRRT.   #Acute kidney injury, oligoanuric, dialysis dependent:  - Likely ischemic ATN from shock/sepsis.  She was on ARB prior to admission.  Baseline Cr less than 1.  Initially required CRRT which was stopped on 8/21 and initiated IHD on 8/23.  Seen by palliative care team.  She had a tunneled catheter LIJ with IR on 9/7 - HD per MWF schedule today but UOP appears to be improving - would monitor labs daily/UOP and made decision re: further dialysis if needed --> can be done at [facility] - monitoring for renal recovery  #Septic shock resolved.   #High-grade small bowel obstruction now status post ex lap and lysis of adhesion.  On feeds and off of TPN.  per primary and general surgery  # Acute hypoxic respiratory failure - optimize volume status with HD and diuretics PRN   #Hyponatremia, hypervolemic: resolved   #Anemia of critical illness: Received a unit of  blood transfusion on 8/29 and on 9/2.  iron deficiency - s/p repletion.  Started aranesp 60 mcg weekly on Tuesdays (started 9/5). Will increase to 100 mcg on 9/12 dosing.  Transfuse PRBC's as needed.  # Hypophosphatemia - repleted   Dispo - acceptable for transition to [facility] per primary team discretion from a strictly renal standpoint.  She has dialysis-dependent AKI and needs to be monitored for recovery, have HD prn  Justin Mend, MD 02/04/2022 8:54 AM

## 2022-02-04 NOTE — Care Management Important Message (Signed)
Important Message  Patient Details  Name: [name] MRN: [id number] Date of Birth: 12-24-1945   Medicare Important Message Given:  Yes     Shelda Altes 02/04/2022, 10:01 AM

## 2022-02-04 NOTE — Progress Notes (Signed)
Report given to Isatu Sesay at [place]. Transportation arranged with [facility]. Patient will be transported in around 1 hour. Contacted son Larkin Ina to update on transportation and estimated time of transfer to [facility].

## 2022-02-04 NOTE — Discharge Summary (Signed)
Physician Discharge Summary   Patient: [name] MRN: [id number] DOB: September 13, 1945  Admit date:     01/07/2022  Discharge date: 02/04/22  Discharge Physician: Patrecia Pour   PCP: Chevis Pretty, FNP   Recommendations at discharge:  Discharging to [place] after protracted hospitalization for septic shock and SBO requiring ex lap. Dependent on cor track tube feeds at this time as well as intermittent hemodialysis.  Recommend advancing diet as tolerated, consideration of PEG tube if unable to improve per oral nutritional intake in the coming weeks.  Nephrology will continue following, monitoring metabolic parameters and urine output to monitor for renal recovery.  Recommend palliative care to be involved in her care going forward to maintain concordance of care with her goals/wishes.   Discharge Diagnoses: Principal Problem:   Septic shock ([place]) Active Problems:   Respiratory failure requiring intubation (HCC)   Small bowel obstruction (HCC)   COPD (chronic obstructive pulmonary disease) (HCC)   Hyperlipidemia   Hypothyroidism (acquired)   Essential hypertension   History of pulmonary embolus (PE)   AKI (acute kidney injury) ([place])   Acute respiratory failure with hypoxia and hypercapnia (HCC)   Pressure injury of skin   Malnutrition of moderate degree  Hospital Course: [name] is a 76 year old female with PMH of PE, HTN, HLD, hypothyroidism, osteoarthritis and depression who returned to [facility] ED with nausea, vomiting and diarrhea and found to small bowel obstruction.  She then developed AKI and septic shock and underwent emergent ex lap with lysis of adhesion on 8/17.  Postoperatively, she remained intubated, started on CRRT and transferred to [facility] on 8/19.  Eventually extubated on 8/21, and started on TNA.  She is started intermittent HD on 8/23.    Hospital course complicated by A-fib with RVR and diarrhea.  Cardiology consulted and she was started on IV amiodarone  for A-fib with RVR.  In regards to diarrhea, C. difficile negative.  GIP positive for STEC and symptoms have improved.   Patient had repeat CT abdomen as part of evaluation for worsening abdominal pain that was negative for abscess, showed decreasing bowel distention, contrast traversing to colon. There were moderate bilateral pleural effusion with compressive atelectasis. She underwent right thoracocentesis with removal of 1.1 L on 8/25. Lasix has been given and volume otherwise managed with HD with improving respiratory status. [place] placed on 9/7, and the patient is tolerating intermittent HD on 9/9 and 9/11, the day of discharge.    General surgery was following.  SLP recommended dysphagia 3 diet after FEES on 8/30. IR placed cortrack after unsuccessful attempt by RD on 9/5.  Tube feeding started on 9/6 and ultimately has advanced to goal rate for the past several days. The patient has a benign abdominal exam, is passing stools, having nausea without vomiting, and struggling to increase oral intake at this time. Plan is to continue monitoring for recovery overall and improved ability to take po while providing nutrition with tube feeds. Would need palliative care discussion Re: gastrostomy tube placement vs. discontinuation of cor track alone if does not continue slow clinical improvement.   Assessment and Plan: Septic shock in the setting of mall bowel obstruction and intra-abdominal infection/STEC: Resolved. - Completed antibiotic course with aztreonam and metronidazole on 8/27 - Repeat CT on 8/24 negative for abscess, confirming decreased bowel distention   SBO status post laparotomy 8/17: Having regular BMs. Dysphagia-SLP recommended dysphagia 3 diet. Decreased oral intake - IR placed cortrack after unsuccessful attempt by RD on 9/5.  Tube feeding started on 9/6.  - Hopeful her ability/willingness to take po will improve with more recovery time since laparotomy. If not, could have PEG placed at  [facility] as well. Palliative care has been involved, though discussions clearly worsen patient's anxiety.    Acute metabolic encephalopathy: In the setting of septic shock and SBO: Resolved.  - Monitor with repeat HD. She is oriented.   Paroxysmal A-fib with RVR/PVCs: Currently rate controlled. - Continue metoprolol and amiodarone - Transition to enteral eliquis on discharge. - Cardiology follow up recommended.   Acute hypoxic respiratory failure/pulmonary edema/bilateral pleural effusion: Improving. - S/p right thoracocentesis on 8/25 by PCCM - Fluid management by HD per nephrology, improving with UF - Encourage IS.    Oliguric AKI: Likely from ATN in the setting of sepsis. CRRT discontinued on 8/21. IHD started on 8/23. - Continue nephrology following, UOP and BMP monitoring, tolerating iHD thru [facility] placed on 9/7. Last treatment on day of discharge, 9/11. - Made 850cc/24hrs, BUN/Cr still slowly rising between HD. Planning MWF schedule for now, monitor for renal recovery. - Avoid nephrotoxic meds. Stopped home ARB. Consider lasix dosing on nondialysis days, though this is deferred to nephrology.   Iron deficiency anemia superimposed on anemia of chronic disease: Transfused on 8/21 and 9/2. - ESA weekly per nephrology, increased dose - Monitor for bleeding. Hgb stable at 8.5g/dl on day of discharge.   Hyponatremia, hypokalemia, hypophosphatemia: Resolved. - Monitor and correct as appropriate   Abdominal pain/diarrhea/loose stool: Abdominal exam benign.  No fever or leukocytosis to suggest infection. - Added Bentyl 10 mg 3 times daily AC - STEC diagnosed 8/27. Stools improved from that time, now loose most likely due to tube feeds.   Leukocytosis: Likely demargination.  Resolved.   Chronic lymphedema: Continue pressure therapy   Morbid obesity: Body mass index is 40.23 kg/m.  Moderate protein calorie malnutrition:  - Tube feeds as above  DNR status   Pressure injury: POA.  Treat by offloading and optimizing nutritional status.     Pressure Injury 01/10/22 Coccyx Medial;Mid Stage 2 -  Partial thickness loss of dermis presenting as a shallow open injury with a red, pink wound bed without slough. pink (Active)  01/10/22 1200  Location: Coccyx  Location Orientation: Medial;Mid  Staging: Stage 2 -  Partial thickness loss of dermis presenting as a shallow open injury with a red, pink wound bed without slough.  Wound Description (Comments): pink  Present on Admission:   Dressing Type Foam - Lift dressing to assess site every shift 02/02/22 2108     Pressure Injury 01/12/22 Face Left Stage 2 -  Partial thickness loss of dermis presenting as a shallow open injury with a red, pink wound bed without slough. red ulceration beneath ETT holder on L cheek (Active)  01/12/22 1815  Location: Face  Location Orientation: Left  Staging: Stage 2 -  Partial thickness loss of dermis presenting as a shallow open injury with a red, pink wound bed without slough.  Wound Description (Comments): red ulceration beneath ETT holder on L cheek  Present on Admission: Yes  Dressing Type Foam - Lift dressing to assess site every shift 01/26/22 0845     Pressure Injury 01/16/22 Buttocks Left (Active)  01/16/22 1230  Location: Buttocks  Location Orientation: Left  Staging:   Wound Description (Comments):   Present on Admission: Yes  Dressing Type Foam - Lift dressing to assess site every shift 02/01/22 2036    Consultants: Surgery, nephrology, PCCM, cardiology, palliative  care Procedures performed:  01/10/22 EXPLORATORY LAPAROTOMY, Virl Cagey, MD  01/18/22 Debby Freiberg, MD   Disposition:  LTAC Diet recommendation: Dysphagia 3, renal, thin liquids. DISCHARGE MEDICATION: Allergies as of 02/04/2022       Reactions   Celebrex [celecoxib] Swelling   Keflet [cephalexin] Swelling   Penicillins Hives, Swelling   DID THE REACTION INVOLVE: Swelling of the  face/tongue/throat, SOB, or low BP? Yes-swelling-hives Sudden or severe rash/hives, skin peeling, or the inside of the mouth or nose? Unknown Did it require medical treatment? Unknown When did it last happen?    over 10 years   If all above answers are "NO", may proceed with cephalosporin use.   Sulfa Antibiotics Swelling   Kenalog [triamcinolone Acetonide]    unknown   Latex    Lisinopril Other (See Comments)   unknown   Mobic [meloxicam]    SWELLING        Medication List     STOP taking these medications    furosemide 20 MG tablet Commonly known as: LASIX   omeprazole-sodium bicarbonate 40-1100 MG capsule Commonly known as: Zegerid   telmisartan 80 MG tablet Commonly known as: Micardis       TAKE these medications    acetaminophen 650 MG CR tablet Commonly known as: TYLENOL Take 650 mg by mouth every 8 (eight) hours as needed for pain.   albuterol (2.5 MG/3ML) 0.083% nebulizer solution Commonly known as: PROVENTIL Take 3 mLs (2.5 mg total) by nebulization every 6 (six) hours as needed for wheezing or shortness of breath.   ALPRAZolam 0.25 MG tablet Commonly known as: XANAX Take 1 tablet (0.25 mg total) by mouth every 6 (six) hours as needed for anxiety. What changed:  medication strength how much to take when to take this reasons to take this   amiodarone 200 MG tablet Commonly known as: PACERONE Take 1 tablet (200 mg total) by mouth daily.   apixaban 5 MG Tabs tablet Commonly known as: ELIQUIS Take 1 tablet (5 mg total) by mouth 2 (two) times daily.   calcium carbonate 1250 (500 Ca) MG tablet Commonly known as: OS-CAL - dosed in mg of elemental calcium Take 1 tablet by mouth once a week.   dicyclomine 10 MG capsule Commonly known as: BENTYL Take 1 capsule (10 mg total) by mouth 3 (three) times daily before meals.   escitalopram 10 MG tablet Commonly known as: Lexapro Take 1 tablet (10 mg total) by mouth daily.   feeding supplement (NEPRO  CARB STEADY) Liqd Take 237 mLs by mouth 2 (two) times daily between meals.   feeding supplement (OSMOLITE 1.5 CAL) Liqd Place 1,000 mLs into feeding tube continuous. 1,000 mL, Per Tube, at 45 mL/hr, Continuous   feeding supplement (PROSource TF20) liquid Place 60 mLs into feeding tube 2 (two) times daily.   ferrous sulfate 325 (65 FE) MG tablet Take 1 tablet (325 mg total) by mouth daily with breakfast.   folic acid 1 MG tablet Commonly known as: FOLVITE Take 1 tablet (1 mg total) by mouth daily.   guaiFENesin-dextromethorphan 100-10 MG/5ML syrup Commonly known as: ROBITUSSIN DM Take 5 mLs by mouth every 4 (four) hours as needed for cough (chest congestion).   levothyroxine 50 MCG tablet Commonly known as: SYNTHROID TAKE ONE (1) TABLET EACH DAY What changed:  how much to take how to take this when to take this additional instructions   loperamide 2 MG capsule Commonly known as: IMODIUM Take 1 capsule (2 mg total)  by mouth as needed for diarrhea or loose stools.   metoprolol tartrate 25 MG tablet Commonly known as: LOPRESSOR Take 0.5 tablets (12.5 mg total) by mouth 2 (two) times daily.   multivitamin tablet Take 1 tablet by mouth 2 (two) times a week.   ondansetron 4 MG disintegrating tablet Commonly known as: ZOFRAN-ODT Take 1 tablet (4 mg total) by mouth every 8 (eight) hours as needed for nausea or vomiting.   oxyCODONE 5 MG immediate release tablet Commonly known as: Oxy IR/ROXICODONE Take 1 tablet (5 mg total) by mouth every 6 (six) hours as needed for moderate pain or severe pain.   Vitamin D3 10 MCG (400 UNIT) Caps Take 1 capsule by mouth daily.        Follow-up Information     Branch, Alphonse Guild, MD Follow up.   Specialty: Cardiology Why: The office should call you within 1-2 business days to arrange follow-up. If you do not hear from them, please call the number provided. Contact information: [address] [place]  [address] [phone]         Virl Cagey, MD. Schedule an appointment as soon as possible for a visit.   Specialty: General Surgery Why: For follow up Contact information: [address] Linna Hoff [facility] [address] Dewey, Mary-Margaret, FNP Follow up.   Specialty: Family Medicine Contact information: [place] [address] [phone]         [place], [place] Follow up.   Contact information: [place] [address] [phone]                Discharge Exam: BP 109/75 (BP Location: Left Wrist)   Pulse 73   Temp 98.9 F (37.2 C) (Oral)   Resp 17   Ht $R'5\' 4"'oB$  (1.626 m)   Wt 106.7 kg   SpO2 100%   BMI 40.38 kg/m   Frail elderly female in no distress. She denies abdominal pain this morning while at HD, anticipates being tired for the rest of the day after HD as usual.  Clear, nonlabored, diminished at bases Irreg irreg, rate in 70's, no MRG, trace LE edema Soft, NT, ND, +BS. Incision healing well without erythema/induration/discharge Alert, very HOH but interactive and oriented Anxious with congruent affect. Calm, pleasant.  Condition at discharge: stable  The results of significant diagnostics from this hospitalization (including imaging, microbiology, ancillary and laboratory) are listed below for reference.   Imaging Studies: IR Fluoro Guide CV Line Left  Result Date: 01/31/2022 INDICATION: 76 year old female referred for tunneled hemodialysis catheter EXAM: IMAGE GUIDED PLACEMENT OF TUNNELED HEMODIALYSIS CATHETER MEDICATIONS: 1 g vancomycin. The antibiotic was given in an appropriate time interval prior to skin puncture. ANESTHESIA/SEDATION: Moderate (conscious) sedation was employed during this procedure. A total of Versed 1.0 mg and Fentanyl 50 mcg was administered intravenously. Moderate Sedation Time: 14 minutes. The patient's level of consciousness and vital signs were monitored continuously by  radiology nursing throughout the procedure under my direct supervision. FLUOROSCOPY TIME:  Fluoroscopy Time: 0 minutes 36 seconds (14 mGy). COMPLICATIONS: None PROCEDURE: Informed written consent was obtained from the patient after a discussion of the risks, benefits, and alternatives to treatment. Questions regarding the procedure were encouraged and answered. The temporary left-sided IJ hemodialysis catheter was removed with manual pressure for hemostasis. The left neck and chest were then prepped with chlorhexidine in a sterile fashion, and a sterile drape was applied covering the operative field.  Maximum barrier sterile technique with sterile gowns and gloves were used for the procedure. A timeout was performed prior to the initiation of the procedure. Ultrasound survey was performed. The left internal jugular vein was confirmed to be patent, with images stored and sent to PACS. Micropuncture kit was utilized to access the left internal jugular vein under direct, real-time ultrasound guidance after the overlying soft tissues were anesthetized with 1% lidocaine with epinephrine. Stab incision was made with 11 blade scalpel. Microwire was passed centrally. The microwire was then marked to measure appropriate internal catheter length. External tunneled length was estimated. A total tip to cuff length of 23 cm was selected. 035 guidewire was advanced to the level of the IVC. Skin and subcutaneous tissues of chest wall below the clavicle were generously infiltrated with 1% lidocaine for local anesthesia. A small stab incision was made with 11 blade scalpel. The selected hemodialysis catheter was tunneled in a retrograde fashion from the anterior chest wall to the venotomy incision. Serial dilation was performed and then a peel-away sheath was placed. The catheter was then placed through the peel-away sheath with tips ultimately positioned within the superior aspect of the right atrium. Final catheter positioning was  confirmed and documented with a spot radiographic image. The catheter aspirates and flushes normally. The catheter was flushed with appropriate volume heparin dwells. The catheter exit site was secured with a 0-Prolene retention suture. Gel-Foam slurry was infused into the soft tissue tract. The venotomy incision was closed Derma bond and sterile dressing. Dressings were applied at the chest wall. Patient tolerated the procedure well and remained hemodynamically stable throughout. No complications were encountered and no significant blood loss encountered. IMPRESSION: Status post image guided left-sided IJ tunneled hemodialysis catheter. Signed, Dulcy Fanny. Nadene Rubins, RPVI [facility] Radiology Electronically Signed   By: Corrie Mckusick D.O.   On: 01/31/2022 10:07   IR US Guide Vasc Access Left  Result Date: 01/31/2022 INDICATION: 76 year old female referred for tunneled hemodialysis catheter EXAM: IMAGE GUIDED PLACEMENT OF TUNNELED HEMODIALYSIS CATHETER MEDICATIONS: 1 g vancomycin. The antibiotic was given in an appropriate time interval prior to skin puncture. ANESTHESIA/SEDATION: Moderate (conscious) sedation was employed during this procedure. A total of Versed 1.0 mg and Fentanyl 50 mcg was administered intravenously. Moderate Sedation Time: 14 minutes. The patient's level of consciousness and vital signs were monitored continuously by radiology nursing throughout the procedure under my direct supervision. FLUOROSCOPY TIME:  Fluoroscopy Time: 0 minutes 36 seconds (14 mGy). COMPLICATIONS: None PROCEDURE: Informed written consent was obtained from the patient after a discussion of the risks, benefits, and alternatives to treatment. Questions regarding the procedure were encouraged and answered. The temporary left-sided IJ hemodialysis catheter was removed with manual pressure for hemostasis. The left neck and chest were then prepped with chlorhexidine in a  sterile fashion, and a sterile drape was applied covering the operative field. Maximum barrier sterile technique with sterile gowns and gloves were used for the procedure. A timeout was performed prior to the initiation of the procedure. Ultrasound survey was performed. The left internal jugular vein was confirmed to be patent, with images stored and sent to PACS. Micropuncture kit was utilized to access the left internal jugular vein under direct, real-time ultrasound guidance after the overlying soft tissues were anesthetized with 1% lidocaine with epinephrine. Stab incision was made with 11 blade scalpel. Microwire was passed centrally. The microwire was then marked to measure appropriate internal catheter length. External tunneled length was estimated.  A total tip to cuff length of 23 cm was selected. 035 guidewire was advanced to the level of the IVC. Skin and subcutaneous tissues of chest wall below the clavicle were generously infiltrated with 1% lidocaine for local anesthesia. A small stab incision was made with 11 blade scalpel. The selected hemodialysis catheter was tunneled in a retrograde fashion from the anterior chest wall to the venotomy incision. Serial dilation was performed and then a peel-away sheath was placed. The catheter was then placed through the peel-away sheath with tips ultimately positioned within the superior aspect of the right atrium. Final catheter positioning was confirmed and documented with a spot radiographic image. The catheter aspirates and flushes normally. The catheter was flushed with appropriate volume heparin dwells. The catheter exit site was secured with a 0-Prolene retention suture. Gel-Foam slurry was infused into the soft tissue tract. The venotomy incision was closed Derma bond and sterile dressing. Dressings were applied at the chest wall. Patient tolerated the procedure well and remained hemodynamically stable throughout. No complications were encountered and no  significant blood loss encountered. IMPRESSION: Status post image guided left-sided IJ tunneled hemodialysis catheter. Signed, Dulcy Fanny. Nadene Rubins, RPVI [facility] Radiology Electronically Signed   By: Corrie Mckusick D.O.   On: 01/31/2022 10:07   DG Abd 1 View  Result Date: 01/29/2022 CLINICAL DATA:  Feeding tube placement EXAM: ABDOMEN - 1 VIEW COMPARISON:  01/29/2022, CT 01/17/2022 FINDINGS: Two low resolution intraoperative spot views of the abdomen are submitted. Images were acquired during feeding tube placement. Total fluoroscopy time was 48 seconds, dose of 8.7 mGy. The images demonstrate tip of esophageal tube to overlie the stomach. Contrast injection opacifies the stomach and proximal duodenum. Patient is noted to have moderate hiatal hernia on CT IMPRESSION: Fluoroscopic assistance provided during feeding tube placement with tip of tube in the stomach. Electronically Signed   By: Donavan Foil M.D.   On: 01/29/2022 18:20   DG Abd Portable 1V  Result Date: 01/29/2022 CLINICAL DATA:  Feeding tube placement EXAM: PORTABLE ABDOMEN - 1 VIEW COMPARISON:  01/16/2022 FINDINGS: Nonobstructive pattern of included bowel gas. Non weighted enteric feeding tube is positioned with tip near the gastroesophageal junction. No obvious free air on supine radiograph. IMPRESSION: Non weighted enteric feeding tube is positioned with tip near the gastroesophageal junction. Recommend advancement to ensure subdiaphragmatic and post pyloric positioning. Electronically Signed   By: Delanna Ahmadi M.D.   On: 01/29/2022 11:21   DG Chest 2 View  Result Date: 01/22/2022 CLINICAL DATA:  Follow-up pleural effusion EXAM: CHEST - 2 VIEW COMPARISON:  Radiographs 01/20/2022 FINDINGS: Decreased size of the layering left pleural effusion since 01/20/2022. Remainder not substantially changed. Left-greater-than-right basilar airspace opacities. Bilateral interstitial opacities. No  pneumothorax. Cardiomegaly. Aortic calcification. Right CVC tip in the right atrium.  Left IJ CVC tip in the low SVC. IMPRESSION: 1. Decreased size of the left pleural effusion. 2. Cardiomegaly and mild edema. Electronically Signed   By: Placido Sou M.D.   On: 01/22/2022 08:02   DG CHEST PORT 1 VIEW  Result Date: 01/20/2022 CLINICAL DATA:  Pleural effusion EXAM: PORTABLE CHEST 1 VIEW COMPARISON:  January 18, 2022 FINDINGS: The right and left PICC lines are stable. No pneumothorax. Interstitial opacities remain in the lungs, mildly improved. Stable cardiomegaly. The hila and mediastinum are unchanged. There is a left-sided pleural effusion with underlying opacity. No other changes. IMPRESSION: 1. Cardiomegaly and mild edema.  The edema appears mildly improved.  2. Probable tiny right effusion. Moderate left effusion with underlying opacity, likely atelectasis. 3. No other change. Electronically Signed   By: Dorise Bullion III M.D.   On: 01/20/2022 08:26   DG CHEST PORT 1 VIEW  Result Date: 01/18/2022 CLINICAL DATA:  [id number]; status post thoracentesis EXAM: PORTABLE CHEST 1 VIEW COMPARISON:  Chest x-ray dated January 16, 2022 FINDINGS: Again seen is left transjugular central venous catheter with its tip at the upper-mid SVC region and the right IJ central venous catheter with its tip at the proximal right atrium, stable. Cardiomegaly. There has been interval right thoracentesis. No pneumothorax. Left pleural effusion. Pulmonary edema. Likely superimposed left basilar atelectasis. Moderate thoracic spondylosis. IMPRESSION: There has been interval right thoracentesis. No pneumothorax. Cardiomegaly, pulmonary edema and left pleural effusion. Electronically Signed   By: Frazier Richards M.D.   On: 01/18/2022 17:55   CT ABDOMEN PELVIS WO CONTRAST  Result Date: 01/17/2022 CLINICAL DATA:  Abdominal pain, post-op s/p ex lap with LOA, leukocytosis. Postop for small-bowel obstruction EXAM: CT ABDOMEN AND PELVIS  WITHOUT CONTRAST TECHNIQUE: Multidetector CT imaging of the abdomen and pelvis was performed following the standard protocol without IV contrast. RADIATION DOSE REDUCTION: This exam was performed according to the departmental dose-optimization program which includes automated exposure control, adjustment of the mA and/or kV according to patient size and/or use of iterative reconstruction technique. COMPARISON:  01/09/2022 FINDINGS: Lower chest: Moderate to large bilateral pleural effusions, increasing since prior study. Compressive atelectasis in the lower lobes. Coronary artery and aortic calcifications. Small to moderate-sized hiatal hernia. Hepatobiliary: No focal liver abnormality is seen. Status post cholecystectomy. No biliary dilatation. Pancreas: No focal abnormality or ductal dilatation. Spleen: No focal abnormality.  Normal size. Adrenals/Urinary Tract: No adrenal abnormality. No focal renal abnormality. No stones or hydronephrosis. Urinary bladder is unremarkable. Stomach/Bowel: Oral contrast material seen within the distal small bowel and colon. Decreasing gaseous distension of small bowel loops. Continued mild gaseous distention of pelvic small bowel loops measuring up to 3.3 cm in diameter. Stomach and large bowel unremarkable. Vascular/Lymphatic: Scattered aortic atherosclerosis. No evidence of aneurysm or adenopathy. Reproductive: Prior hysterectomy.  No adnexal masses. Other: No free fluid or free air. Edema throughout the subcutaneous soft tissues of the abdominal and pelvic wall compatible with anasarca. Musculoskeletal: No acute bony abnormality. Postoperative changes in the lower lumbar spine. IMPRESSION: Decreasing gaseous distension of small bowel loops with improving bowel gas pattern. Continued mild distention of a few lower abdominal/pelvic small bowel loops measuring up to 3.3 cm. Oral contrast material has passed into the distal small bowel and colon. Moderate to large bilateral pleural  effusions, increasing since prior study. Compressive atelectasis in the lower lobes. Small to moderate hiatal hernia. Aortic atherosclerosis. Anasarca like edema throughout the abdominal wall. Electronically Signed   By: Rolm Baptise M.D.   On: 01/17/2022 21:29   DG Abd 2 Views  Result Date: 01/17/2022 CLINICAL DATA:  Abdominal pain EXAM: ABDOMEN - 2 VIEW COMPARISON:  CT 01/09/2022 FINDINGS: Laparotomy skin staples are noted. Multiple gas-filled dilated loops of small bowel are again seen within the mid abdomen suggesting a postoperative ileus or persistent small bowel obstruction. No gas is seen within the large bowel. No gross free intraperitoneal gas. There is central clumping of bowel loops suggesting underlying ascites. Small bilateral pleural effusions are present. Central venous catheter tip noted within the low right atrium. Cholecystectomy clips in the right upper quadrant. IMPRESSION: 1. Persistent small bowel obstruction versus postoperative ileus. 2. Suspected ascites. 3.  Small bilateral pleural effusions. Electronically Signed   By: Fidela Salisbury M.D.   On: 01/17/2022 00:13   CT HEAD WO CONTRAST (5MM)  Result Date: 01/16/2022 CLINICAL DATA:  Provided history: Altered mental status, nontraumatic. EXAM: CT HEAD WITHOUT CONTRAST TECHNIQUE: Contiguous axial images were obtained from the base of the skull through the vertex without intravenous contrast. RADIATION DOSE REDUCTION: This exam was performed according to the departmental dose-optimization program which includes automated exposure control, adjustment of the mA and/or kV according to patient size and/or use of iterative reconstruction technique. COMPARISON:  No pertinent prior exams available for comparison. FINDINGS: Brain: Mild generalized parenchymal atrophy. There is no acute intracranial hemorrhage. No demarcated cortical infarct. No extra-axial fluid collection. No evidence of an intracranial mass. No midline shift. Vascular: No  hyperdense vessel. Atherosclerotic calcifications. Skull: No fracture or aggressive osseous lesion. Sinuses/Orbits: No mass or acute finding within the imaged orbits. 18 mm polyp versus mucous retention cyst, and minimal background mucosal thickening, within the left maxillary sinus. 16 mm mucous retention cyst versus polyp within the left frontal sinus. Other: Small-volume fluid within the bilateral mastoid air cells. IMPRESSION: 1. No evidence of acute intracranial abnormality. 2. Mild generalized parenchymal atrophy. 3. Paranasal sinus disease, as described. 4. Small bilateral mastoid effusions. Electronically Signed   By: Kellie Simmering D.O.   On: 01/16/2022 16:30   DG CHEST PORT 1 VIEW  Result Date: 01/16/2022 CLINICAL DATA:  Leukocytosis. EXAM: PORTABLE CHEST 1 VIEW COMPARISON:  Chest 01/12/2022 FINDINGS: Endotracheal tube removed.  NG removed. Left jugular dual lumen catheter tip in the SVC unchanged. Right sided central venous catheter extends into the right atrium with the tip not visualized but appears unchanged from the prior study Cardiac enlargement with vascular congestion and bilateral airspace disease most compatible with edema. Bilateral pleural effusions and bibasilar atelectasis/edema unchanged. No pneumothorax IMPRESSION: Endotracheal tube removed. Diffuse bilateral airspace disease and bilateral effusions most likely pulmonary edema unchanged. Underlying pneumonia not excluded. Electronically Signed   By: Franchot Gallo M.D.   On: 01/16/2022 15:35   ECHOCARDIOGRAM COMPLETE  Result Date: 01/14/2022    ECHOCARDIOGRAM REPORT   Patient Name:   ALEKHYA Ruybal Date of Exam: 01/14/2022 Medical Rec #:  [id number]    Height:       64.0 in Accession #:    [id number]   Weight:       199.3 lb Date of Birth:  08-16-1945     BSA:          1.953 m Patient Age:    13 years     BP:           121/65 mmHg Patient Gender: F            HR:           98 bpm. Exam Location:  Inpatient Procedure: 2D Echo, Cardiac  Doppler and Color Doppler Indications:    Ectopy  History:        Patient has prior history of Echocardiogram examinations, most                 recent 10/22/2017. COPD; Risk Factors:Hypertension and                 Dyslipidemia.  Sonographer:    Jefferey Pica Referring Phys: [id number] Weiner  1. Left ventricular ejection fraction, by estimation, is 60 to 65%. The left ventricle has normal function. The left ventricle has no regional wall motion abnormalities. Left ventricular  diastolic parameters were normal.  2. Right ventricular systolic function is normal. The right ventricular size is normal. There is normal pulmonary artery systolic pressure.  3. Left atrial size was mildly dilated.  4. Right atrial size was mildly dilated.  5. Calcified subchordal apparatus. The mitral valve is abnormal. Trivial mitral valve regurgitation. No evidence of mitral stenosis.  6. The aortic valve is tricuspid. There is mild calcification of the aortic valve. Aortic valve regurgitation is not visualized. Aortic valve sclerosis is present, with no evidence of aortic valve stenosis.  7. The inferior vena cava is normal in size with greater than 50% respiratory variability, suggesting right atrial pressure of 3 mmHg. FINDINGS  Left Ventricle: Left ventricular ejection fraction, by estimation, is 60 to 65%. The left ventricle has normal function. The left ventricle has no regional wall motion abnormalities. The left ventricular internal cavity size was normal in size. There is  no left ventricular hypertrophy. Left ventricular diastolic parameters were normal. Right Ventricle: The right ventricular size is normal. No increase in right ventricular wall thickness. Right ventricular systolic function is normal. There is normal pulmonary artery systolic pressure. The tricuspid regurgitant velocity is 2.61 m/s, and  with an assumed right atrial pressure of 5 mmHg, the estimated right ventricular systolic pressure is 47.8  mmHg. Left Atrium: Left atrial size was mildly dilated. Right Atrium: Right atrial size was mildly dilated. Pericardium: There is no evidence of pericardial effusion. Mitral Valve: Calcified subchordal apparatus. The mitral valve is abnormal. There is mild thickening of the mitral valve leaflet(s). There is mild calcification of the mitral valve leaflet(s). Trivial mitral valve regurgitation. No evidence of mitral valve stenosis. Tricuspid Valve: The tricuspid valve is normal in structure. Tricuspid valve regurgitation is mild . No evidence of tricuspid stenosis. Aortic Valve: The aortic valve is tricuspid. There is mild calcification of the aortic valve. Aortic valve regurgitation is not visualized. Aortic valve sclerosis is present, with no evidence of aortic valve stenosis. Aortic valve peak gradient measures 15.9 mmHg. Pulmonic Valve: The pulmonic valve was normal in structure. Pulmonic valve regurgitation is not visualized. No evidence of pulmonic stenosis. Aorta: The aortic root is normal in size and structure. Venous: The inferior vena cava is normal in size with greater than 50% respiratory variability, suggesting right atrial pressure of 3 mmHg. IAS/Shunts: No atrial level shunt detected by color flow Doppler.  LEFT VENTRICLE PLAX 2D LVIDd:         4.40 cm LVIDs:         3.00 cm LV PW:         0.90 cm LV IVS:        0.90 cm LVOT diam:     1.80 cm LV SV:         65 LV SV Index:   33 LVOT Area:     2.54 cm  RIGHT VENTRICLE             IVC RV S prime:     14.90 cm/s  IVC diam: 2.05 cm LEFT ATRIUM             Index        RIGHT ATRIUM           Index LA diam:        3.90 cm 2.00 cm/m   RA Area:     16.40 cm LA Vol (A2C):   62.6 ml 32.05 ml/m  RA Volume:   49.60 ml  25.39 ml/m LA Vol (  A4C):   44.9 ml 22.99 ml/m LA Biplane Vol: 53.5 ml 27.39 ml/m  AORTIC VALVE                 PULMONIC VALVE AV Area (Vmax): 1.98 cm     PV Vmax:       1.23 m/s AV Vmax:        199.50 cm/s  PV Peak grad:  6.1 mmHg AV Peak  Grad:   15.9 mmHg LVOT Vmax:      155.00 cm/s LVOT Vmean:     97.300 cm/s LVOT VTI:       0.257 m  AORTA Ao Root diam: 2.80 cm Ao Asc diam:  3.10 cm TRICUSPID VALVE TR Peak grad:   27.2 mmHg TR Vmax:        261.00 cm/s  SHUNTS Systemic VTI:  0.26 m Systemic Diam: 1.80 cm Jenkins Rouge MD Electronically signed by Jenkins Rouge MD Signature Date/Time: 01/14/2022/3:20:12 PM    Final    DG CHEST PORT 1 VIEW  Result Date: 01/12/2022 CLINICAL DATA:  Central line placement. EXAM: PORTABLE CHEST 1 VIEW COMPARISON:  Chest radiograph 01/10/2022 FINDINGS: New left internal jugular dialysis catheter tip overlies the upper SVC. No pneumothorax. The endotracheal tube tip is now 2.8 cm from the carina. Right internal jugular central line and enteric tube remain in place. Unchanged bilateral pleural effusions and bilateral airspace disease. Stable cardiomegaly. IMPRESSION: 1. New left internal jugular dialysis catheter with tip overlying the upper SVC. No pneumothorax. 2. Endotracheal tube tip now 2.8 cm from the carina. Stable right central line and enteric tube. 3. Unchanged bilateral pleural effusions and bilateral airspace disease. Electronically Signed   By: Keith Rake M.D.   On: 01/12/2022 19:06   DG CHEST PORT 1 VIEW  Result Date: 01/10/2022 CLINICAL DATA:  Status post endotracheal tube placement. EXAM: PORTABLE CHEST 1 VIEW COMPARISON:  January 10, 2022 (1:03 p.m.) FINDINGS: An endotracheal tube is seen with its distal tip approximately 6.3 cm from the carina. Stable nasogastric tube and right internal jugular venous catheter positioning is noted. The heart size and mediastinal contours are within normal limits. Mild, stable patchy airspace disease is seen within the mid to upper right lung with predominant stable bilateral pleural effusions. No pneumothorax is identified. The visualized skeletal structures are unremarkable. IMPRESSION: 1. Endotracheal tube, nasogastric tube and right internal jugular venous  catheter positioning, as described above. 2. Stable bilateral pleural effusions with stable mid to upper right lung airspace disease. Electronically Signed   By: Virgina Norfolk M.D.   On: 01/10/2022 23:52   DG Chest Port 1V same Day  Result Date: 01/10/2022 CLINICAL DATA:  Line adjustment EXAM: PORTABLE CHEST 1 VIEW COMPARISON:  Same day chest radiograph at 1156 hours FINDINGS: Interval retraction of the endotracheal tube which is now within the midthoracic trachea at the level of the aortic knob approximally 4.5 cm from the carina. Right IJ central venous catheter with tip in the right atrium. Nasogastric tube courses below the diaphragm with tip obscured by collimation. The heart size and mediastinal contours unchanged. Aortic atherosclerosis. Small bilateral pleural effusions with adjacent airspace opacities. IMPRESSION: Interval retraction of the endotracheal tube which is now in the midthoracic trachea 4.5 cm from the carina. Electronically Signed   By: Dahlia Bailiff M.D.   On: 01/10/2022 13:24   DG Chest Port 1 View  Result Date: 01/10/2022 CLINICAL DATA:  Evaluate ET tube and NG tube placement. EXAM: PORTABLE CHEST 1 VIEW COMPARISON:  01/09/22 FINDINGS: The tip of the endotracheal tube is in the proximal right mainstem bronchus. Consider withdrawing by approximately 2 cm. The enteric tube tip and side port are below the GE junction. The enteric tube tip projects over the distal stomach. Right IJ catheter tip is in the inferior right atrium. No pneumothorax identified after catheter placement. There are small bilateral pleural effusions. The lung volumes are low and there is progressive atelectasis and/or consolidation involving the left lower lung. IMPRESSION: 1. ET tube tip is in the proximal right mainstem bronchus. Satisfactory position of the right IJ catheter and enteric tube. 2. Low lung volumes with progressive atelectasis and/or consolidation involving the left lower lung. 3. Small  bilateral pleural effusions. These results will be called to the ordering clinician or representative by the Radiologist Assistant, and communication documented in the PACS or [employer]. Electronically Signed   By: Kerby Moors M.D.   On: 01/10/2022 12:07   CT ABDOMEN PELVIS WO CONTRAST  Result Date: 01/09/2022 CLINICAL DATA:  Acute abdominal pain, concern for thickened bowel EXAM: CT ABDOMEN AND PELVIS WITHOUT CONTRAST TECHNIQUE: Multidetector CT imaging of the abdomen and pelvis was performed following the standard protocol without IV contrast. RADIATION DOSE REDUCTION: This exam was performed according to the departmental dose-optimization program which includes automated exposure control, adjustment of the mA and/or kV according to patient size and/or use of iterative reconstruction technique. COMPARISON:  CT abdomen and pelvis 01/07/2022 FINDINGS: Lower chest: Small right-greater-than-left pleural effusions and associated atelectasis/consolidation. Patchy infiltrates in both lower lung fields. Mild bronchial wall thickening. Hepatobiliary: No focal liver abnormality is seen. Status post cholecystectomy. No biliary dilatation. Mixed pancreatic atrophy. No peripancreatic inflammatory stranding or fluid. Pancreas: Unremarkable. Spleen: Normal in size without focal abnormality. Adrenals/Urinary Tract: Unremarkable adrenal glands. No hydronephrosis or urinary calculi. Nondistended bladder. Stomach/Bowel: Moderate to large fixed hiatal hernia. The stomach is distended with fluid mixed with oral contrast. Fluid within the lower thoracic esophagus. Dilation of the duodenum and jejunum with abrupt transition point in the right lower quadrant (circa series 2/image 55 and series 5/image 55). The small bowel is dilated measuring up to 4.6 cm in diameter. Similar to increased diffuse small bowel wall thickening with engorgement of the mesenteric vasculature. Mesenteric edema and small amount of free fluid in  the abdomen and pelvis. No definite pneumatosis. Vascular/Lymphatic: Mild aortic atherosclerotic calcification. No suspicious abdominopelvic lymphadenopathy. Reproductive: Status post hysterectomy. No adnexal masses. Other: No free intraperitoneal air. Musculoskeletal: Posterior lumbar fusion at L4-S1. No acute osseous abnormality. Body wall anasarca. IMPRESSION: Abnormal dilation of the small bowel with transition point in the right lower quadrant compatible with high-grade bowel obstruction. The bowel wall is markedly thickened with engorged mesenteric vasculature. Evaluation for ischemia is limited without IV contrast. No free intraperitoneal air. No pneumatosis. Small right-greater-than-left pleural effusions and associated atelectasis/consolidation. Pneumonia is difficult to exclude. Moderate to large fixed hiatal hernia. Electronically Signed   By: Placido Sou M.D.   On: 01/09/2022 21:53   DG Chest 1 View  Result Date: 01/09/2022 CLINICAL DATA:  Check nasogastric catheter EXAM: PORTABLE CHEST 1 VIEW COMPARISON:  Film from earlier in the same day. FINDINGS: Gastric catheter has been advanced but remains coiled within the hiatal hernia. This should be withdrawn completely and readvanced in attempt to gain access to majority of the body of the stomach within the abdomen. Cardiac shadow is stable. Patchy airspace opacities are again seen stable from the prior exam. IMPRESSION: Gastric catheter coiled within hiatal hernia. This  should be withdrawn and readvanced as described. Electronically Signed   By: Inez Catalina M.D.   On: 01/09/2022 03:18   DG CHEST PORT 1 VIEW  Result Date: 01/09/2022 CLINICAL DATA:  NG tube placement EXAM: PORTABLE CHEST 1 VIEW COMPARISON:  Radiographs 01/08/2022 FINDINGS: The enteric tube has been readjusted and is now looped in the lower chest with tip oriented superiorly likely within the intrathoracic portion of the stomach. Remainder unchanged. Hazy airspace opacities in the  right lower lung. Left basilar atelectasis/consolidation. Prominent interstitial markings. Probable small right-greater-than-left pleural effusions. Stable cardiomediastinal silhouette. Aortic calcifications. IMPRESSION: 1. The NG tube is coiled in the lower chest with tip oriented superiorly likely within the intrathoracic portion of the stomach. 2. Opacities in the lower lungs suspicious for pneumonia. 3. Probable small right-greater-than-left pleural effusions. These results will be called to the ordering clinician or representative by the Radiologist Assistant, and communication documented in the PACS or [employer]. Electronically Signed   By: Placido Sou M.D.   On: 01/09/2022 02:10   Acute Abdominal Series  Result Date: 01/08/2022 CLINICAL DATA: Nausea vomiting with abdominal pain. EXAM: DG ABDOMEN ACUTE WITH 1 VIEW CHEST COMPARISON:  CT scan 01/07/2022 FINDINGS: Low lung volumes. The cardio pericardial silhouette is enlarged. Interstitial markings are diffusely coarsened with chronic features. Nodular and patchy opacities identified at the right base with associated retrocardiac atelectasis or infiltrate. NG tube tip is in the distal stomach. Diffuse gaseous distention of small bowel persists but appears decreased when comparing to scout image from yesterday's CT scan. Lumbar fusion hardware evident. Surgical clips in the right upper quadrant suggest prior cholecystectomy. IMPRESSION: 1. NG tube tip is in the distal stomach. 2. Diffuse gaseous distention of small bowel persists but appears decreased since yesterday's CT scan. 3. Nodular and patchy opacities at the right base with associated retrocardiac atelectasis or infiltrate. Given nodular appearance of the disease in the right base, CT chest recommended to exclude mass lesion. Electronically Signed   By: Misty Stanley M.D.   On: 01/08/2022 06:59   DG Abdomen 1 View  Result Date: 01/07/2022 CLINICAL DATA:  check NG tube placement EXAM:  ABDOMEN - 1 VIEW COMPARISON:  CT from earlier the same day FINDINGS: Gastric tube has been advanced into the stomach. Dilated small bowel loops in the mid abdomen as before. Lumbosacral fixation hardware noted. IMPRESSION: Gastric tube to the stomach Electronically Signed   By: Lucrezia Europe M.D.   On: 01/07/2022 17:39   CT ABDOMEN PELVIS WO CONTRAST  Result Date: 01/07/2022 CLINICAL DATA:  Abdominal pain, nausea, vomiting, diarrhea x5 days EXAM: CT ABDOMEN AND PELVIS WITHOUT CONTRAST TECHNIQUE: Multidetector CT imaging of the abdomen and pelvis was performed following the standard protocol without IV contrast. RADIATION DOSE REDUCTION: This exam was performed according to the departmental dose-optimization program which includes automated exposure control, adjustment of the mA and/or kV according to patient size and/or use of iterative reconstruction technique. COMPARISON:  01/04/2022 FINDINGS: Lower chest: Small patchy infiltrates are seen lower lung fields, more so in the right middle lobe. There is mild ectasia of bronchi. There is minimal right pleural effusion. Hepatobiliary: No focal abnormalities are seen in liver. There is no dilation of bile ducts. Surgical clips are seen in gallbladder fossa. Pancreas: There is atrophy.  No focal abnormalities are seen. Spleen: Unremarkable. Adrenals/Urinary Tract: Adrenals are unremarkable. There is no hydronephrosis. There are no renal or ureteral stones. Urinary bladder is not distended. High density in the lumen of the  bladder may suggest residual contrast from previous CT. Stomach/Bowel: There is moderate to large fixed hiatal hernia. There is distention of stomach with fluid. There is fluid in the lower thoracic esophagus. There is interval worsening of proximal measuring up to 4.4 cm in diameter. Distal small bowel loops are decompressed. Exact level of transition is difficult to identify. It is possibly in right lower quadrant or right side of pelvis. Colon is  not distended. Vascular/Lymphatic: Scattered arterial calcifications are seen. Reproductive: Uterus is not seen. Other: Small amount of ascites is present. There is no pneumoperitoneum. Musculoskeletal: There is lumbar fusion at L4-L5 and L5-S1 levels. IMPRESSION: There is abnormal dilation of proximal small bowel loops with decompression of distal small bowel loops consistent with high-grade partial small bowel obstruction, possibly in right lower quadrant or right pelvic cavity. This may be caused by adhesions or internal hernia or neoplastic process. There is significant interval worsening of small bowel dilation since 01/04/2022. There is small ascites. There is no pneumoperitoneum. Moderate to large fixed hiatal hernia. There is fluid in the lumen of stomach within the hiatal hernia and fluid in the lumen of lower thoracic esophagus, most likely due to high-grade small bowel obstruction. There is no hydronephrosis. There are patchy infiltrates in lower lung fields, more so in right middle lobe suggesting atelectasis/pneumonia. There is minimal right pleural effusion. Electronically Signed   By: Ernie Avena M.D.   On: 01/07/2022 16:02    Microbiology: Results for orders placed or performed during the hospital encounter of 01/07/22  Urine Culture     Status: Abnormal   Collection Time: 01/07/22  2:55 PM   Specimen: Urine, Clean Catch  Result Value Ref Range Status   Specimen Description   Final    URINE, CLEAN CATCH Performed at [facility], [address], [place], [place] [address]    Special Requests   Final    NONE Performed at [facility], [address] [place], [place] [address]    Culture (A)  Final    >=100,000 COLONIES/mL MULTIPLE SPECIES PRESENT, SUGGEST RECOLLECTION   Report Status 01/09/2022 FINAL  Final  Surgical pcr screen     Status: None   Collection Time: 01/10/22  6:20 AM   Specimen: Nasal Mucosa; Nasal Swab  Result Value Ref Range Status   MRSA, PCR NEGATIVE  NEGATIVE Final   Staphylococcus aureus NEGATIVE NEGATIVE Final    Comment: (NOTE) The Xpert SA Assay (FDA approved for NASAL specimens in patients 28 years of age and older), is one component of a comprehensive surveillance program. It is not intended to diagnose infection nor to guide or monitor treatment. Performed at [facility], [address] [place], [place] [address]   Culture, blood (Routine X 2) w Reflex to ID Panel     Status: None   Collection Time: 01/10/22 12:55 PM   Specimen: BLOOD  Result Value Ref Range Status   Specimen Description BLOOD LEFT ANTECUBITAL  Final   Special Requests   Final    BOTTLES DRAWN AEROBIC AND ANAEROBIC Blood Culture results may not be optimal due to an excessive volume of blood received in culture bottles   Culture   Final    NO GROWTH 5 DAYS Performed at [facility], [address] [place], [place] [address]    Report Status 01/15/2022 FINAL  Final  Culture, blood (Routine X 2) w Reflex to ID Panel     Status: None   Collection Time: 01/10/22 12:55 PM  Specimen: BLOOD  Result Value Ref Range Status   Specimen Description BLOOD BLOOD RIGHT HAND  Final   Special Requests   Final    BOTTLES DRAWN AEROBIC ONLY Blood Culture adequate volume   Culture   Final    NO GROWTH 5 DAYS Performed at [facility], [address], [place], [place] [address]    Report Status 01/15/2022 FINAL  Final  MRSA Next Gen by PCR, Nasal     Status: None   Collection Time: 01/10/22  1:11 PM   Specimen: Nasal Mucosa; Nasal Swab  Result Value Ref Range Status   MRSA by PCR Next Gen NOT DETECTED NOT DETECTED Final    Comment: (NOTE) The GeneXpert MRSA Assay (FDA approved for NASAL specimens only), is one component of a comprehensive MRSA colonization surveillance program. It is not intended to diagnose MRSA infection nor to guide or monitor treatment for MRSA infections. Test performance is not FDA approved in patients less than 53 years old. Performed at  [facility], [address], [place], [place] [address]   Culture, Respiratory w Gram Stain     Status: None   Collection Time: 01/12/22  6:14 PM   Specimen: Tracheal Aspirate; Respiratory  Result Value Ref Range Status   Specimen Description TRACHEAL ASPIRATE  Final   Special Requests NONE  Final   Gram Stain NO WBC SEEN NO ORGANISMS SEEN   Final   Culture   Final    RARE Normal respiratory flora-no Staph aureus or Pseudomonas seen Performed at [facility], [address] [place], [place] [address]    Report Status 01/14/2022 FINAL  Final  MRSA Next Gen by PCR, Nasal     Status: None   Collection Time: 01/12/22  6:50 PM   Specimen: Nasal Mucosa; Nasal Swab  Result Value Ref Range Status   MRSA by PCR Next Gen NOT DETECTED NOT DETECTED Final    Comment: (NOTE) The GeneXpert MRSA Assay (FDA approved for NASAL specimens only), is one component of a comprehensive MRSA colonization surveillance program. It is not intended to diagnose MRSA infection nor to guide or monitor treatment for MRSA infections. Test performance is not FDA approved in patients less than 69 years old. Performed at [facility], [place] [address] [place], [place] [address]   Gastrointestinal Panel by PCR , Stool     Status: Abnormal   Collection Time: 01/18/22  3:42 PM   Specimen: Stool  Result Value Ref Range Status   Campylobacter species NOT DETECTED NOT DETECTED Final   Plesimonas shigelloides NOT DETECTED NOT DETECTED Final   Salmonella species NOT DETECTED NOT DETECTED Final   Yersinia enterocolitica NOT DETECTED NOT DETECTED Final   Vibrio species NOT DETECTED NOT DETECTED Final   Vibrio cholerae NOT DETECTED NOT DETECTED Final   Enteroaggregative E coli (EAEC) NOT DETECTED NOT DETECTED Final   Enterotoxigenic E coli (ETEC) NOT DETECTED NOT DETECTED Final   Shiga like toxin producing E coli (STEC) DETECTED (A) NOT DETECTED Final    Comment: RESULT CALLED TO, READ BACK BY AND VERIFIED  WITH: NAA DORMON 01/20/22 1417 AMK    E. coli O157 NOT DETECTED NOT DETECTED Final   Shigella/Enteroinvasive E coli (EIEC) NOT DETECTED NOT DETECTED Final   Cryptosporidium NOT DETECTED NOT DETECTED Final   Cyclospora cayetanensis NOT DETECTED NOT DETECTED Final   Entamoeba histolytica NOT DETECTED NOT DETECTED Final   Giardia lamblia NOT DETECTED NOT DETECTED Final   Adenovirus F40/41 NOT  DETECTED NOT DETECTED Final   Astrovirus NOT DETECTED NOT DETECTED Final   Norovirus GI/GII NOT DETECTED NOT DETECTED Final   Rotavirus A NOT DETECTED NOT DETECTED Final   Sapovirus (I, II, IV, and V) NOT DETECTED NOT DETECTED Final    Comment: Performed at [facility], [address] [place], [place] [address]  C Difficile Quick Screen (NO PCR Reflex)     Status: None   Collection Time: 01/19/22  6:32 PM   Specimen: STOOL  Result Value Ref Range Status   C Diff antigen NEGATIVE NEGATIVE Final   C Diff toxin NEGATIVE NEGATIVE Final   C Diff interpretation No C. difficile detected.  Final    Comment: Performed at [facility], [place] [address] [place], [place] [address]    Labs: CBC: Recent Labs  Lab 01/31/22 0500 02/01/22 0500 02/02/22 0521 02/03/22 0604 02/04/22 0342  WBC 6.3 6.3 6.6 6.3 5.9  HGB 8.3* 8.2* 8.2* 8.1* 8.5*  HCT 27.2* 26.8* 25.9* 26.9* 28.0*  MCV 92.2 92.4 91.5 94.4 93.6  PLT 368 320 313 302 071   Basic Metabolic Panel: Recent Labs  Lab 01/31/22 0500 02/01/22 1047 02/02/22 0521 02/03/22 0604 02/04/22 0342  NA 137 139 138 136 136  K 3.7 3.8 3.7 3.8 4.1  CL 102 103 103 101 101  CO2 $Re'30 30 29 30 29  'lHd$ GLUCOSE 106* 123* 141* 110* 126*  BUN 41* 55* 66* 45* 49*  CREATININE 1.82* 2.20* 2.23* 1.69* 1.83*  CALCIUM 8.1* 8.1* 8.1* 7.8* 8.1*  MG 1.8  --   --   --   --   PHOS 2.4* 2.3* 2.1* 2.5 2.8   Liver Function Tests: Recent Labs  Lab 01/31/22 0500 02/01/22 1047 02/02/22 0521 02/03/22 0604 02/04/22 0342  AST 20  --   --   --   --   ALT 16  --    --   --   --   ALKPHOS 160*  --   --   --   --   BILITOT 0.4  --   --   --   --   PROT 5.4*  --   --   --   --   ALBUMIN 1.8* 1.7* 1.8* 1.7* 1.7*   CBG: Recent Labs  Lab 02/03/22 0417 02/03/22 0758 02/03/22 1134 02/03/22 2109 02/04/22 0019  GLUCAP 104* 122* 111* 95 108*    Discharge time spent: greater than 30 minutes.  Signed: Patrecia Pour, MD [facility] 02/04/2022

## 2022-02-04 NOTE — Progress Notes (Signed)
Transferred from HD via bed. VS taken and stable. Sleeping quietly. No complaints at this time

## 2022-02-04 NOTE — Progress Notes (Signed)
[place] for Heparin Indication: atrial fibrillation  Allergies  Allergen Reactions   Celebrex [Celecoxib] Swelling   Keflet [Cephalexin] Swelling   Penicillins Hives and Swelling    DID THE REACTION INVOLVE: Swelling of the face/tongue/throat, SOB, or low BP? Yes-swelling-hives Sudden or severe rash/hives, skin peeling, or the inside of the mouth or nose? Unknown Did it require medical treatment? Unknown When did it last happen?    over 10 years   If all above answers are "NO", may proceed with cephalosporin use.    Sulfa Antibiotics Swelling   Kenalog [Triamcinolone Acetonide]     unknown   Latex    Lisinopril Other (See Comments)    unknown   Mobic [Meloxicam]     SWELLING    Patient Measurements: Height: '5\' 4"'$  (162.6 cm) Weight: 106.7 kg (235 lb 3.7 oz) IBW/kg (Calculated) : 54.7 HEPARIN DW (KG): 80   Heparin Dosing Weight: 72 kg  Vital Signs: Temp: 98.9 F (37.2 C) (09/11 0820) Temp Source: Oral (09/11 0820) BP: 118/73 (09/11 1030) Pulse Rate: 71 (09/11 1030)  Labs: Recent Labs    02/02/22 0521 02/03/22 0604 02/04/22 0342  HGB 8.2* 8.1* 8.5*  HCT 25.9* 26.9* 28.0*  PLT 313 302 318  HEPARINUNFRC 0.35 0.33 0.51  CREATININE 2.23* 1.69* 1.83*    Estimated Creatinine Clearance: 31.7 mL/min (A) (by C-G formula based on SCr of 1.83 mg/dL (H)).   Assessment: Pharmacy consulted to dose heparin infusion for post-op afib. Patient was not on any AC PTA.  She is noted s/p ex lap and on TPN.  Cortrack placed 9/5 and tube feed started -plans are for apixaban as long as she tolerates tube feed  Heparin level therapeutic at 0.51. No issues with infusion reported, CBC stable, no signs or symptoms of bleeding noted.  Goal of Therapy:  Heparin level 0.3-0.7 units/ml Monitor platelets by anticoagulation protocol: Yes   Plan:  Continue heparin gtt at 1800 units/hr Monitor daily heparin level and CBC Monitor for sign and  symptoms of bleeding  Avarey Yaeger A. Levada Dy, PharmD, BCPS, FNKF Clinical Pharmacist [place] Please utilize Amion for appropriate phone number to reach the unit pharmacist ([place])  02/04/2022 10:50 AM

## 2022-02-04 NOTE — Progress Notes (Signed)
Received patient in bed to unit.  Alert and oriented.  Informed consent signed and in chart.   Treatment initiated: 0826 Treatment completed: 1203  Patient tolerated well.  Transported back to the room  Alert, without acute distress.  Hand-off given to patient's nurse.   Access used: Cath Access issues: None  Total UF removed: 3.5 Medication(s) given: none Post HD VS: 107/94,13,99%,97.6 Post HD weight: 104.6kg   [facility]

## 2022-02-04 NOTE — Progress Notes (Signed)
Report given to Sara Chu at [facility] [phone]. Report given via phone, questions answered. Will update on transport when arranged after hemodialysis.

## 2022-02-05 DIAGNOSIS — N170 Acute kidney failure with tubular necrosis: Secondary | ICD-10-CM | POA: Diagnosis not present

## 2022-02-05 DIAGNOSIS — I10 Essential (primary) hypertension: Secondary | ICD-10-CM | POA: Diagnosis not present

## 2022-02-05 DIAGNOSIS — K56609 Unspecified intestinal obstruction, unspecified as to partial versus complete obstruction: Secondary | ICD-10-CM | POA: Diagnosis not present

## 2022-02-05 DIAGNOSIS — R112 Nausea with vomiting, unspecified: Secondary | ICD-10-CM | POA: Diagnosis not present

## 2022-02-05 DIAGNOSIS — R109 Unspecified abdominal pain: Secondary | ICD-10-CM | POA: Diagnosis not present

## 2022-02-06 DIAGNOSIS — I10 Essential (primary) hypertension: Secondary | ICD-10-CM | POA: Diagnosis not present

## 2022-02-06 DIAGNOSIS — K56609 Unspecified intestinal obstruction, unspecified as to partial versus complete obstruction: Secondary | ICD-10-CM | POA: Diagnosis not present

## 2022-02-06 DIAGNOSIS — N170 Acute kidney failure with tubular necrosis: Secondary | ICD-10-CM | POA: Diagnosis not present

## 2022-02-06 DIAGNOSIS — R112 Nausea with vomiting, unspecified: Secondary | ICD-10-CM | POA: Diagnosis not present

## 2022-02-06 DIAGNOSIS — R109 Unspecified abdominal pain: Secondary | ICD-10-CM | POA: Diagnosis not present

## 2022-02-14 ENCOUNTER — Ambulatory Visit: Payer: Medicare Other | Admitting: Cardiology

## 2022-02-26 DIAGNOSIS — I480 Paroxysmal atrial fibrillation: Secondary | ICD-10-CM | POA: Diagnosis not present

## 2022-02-26 DIAGNOSIS — R5381 Other malaise: Secondary | ICD-10-CM | POA: Diagnosis not present

## 2022-02-26 DIAGNOSIS — J9691 Respiratory failure, unspecified with hypoxia: Secondary | ICD-10-CM | POA: Diagnosis not present

## 2022-02-26 DIAGNOSIS — N170 Acute kidney failure with tubular necrosis: Secondary | ICD-10-CM | POA: Diagnosis not present

## 2022-02-26 DIAGNOSIS — J449 Chronic obstructive pulmonary disease, unspecified: Secondary | ICD-10-CM | POA: Diagnosis not present

## 2022-02-26 DIAGNOSIS — M9907 Segmental and somatic dysfunction of upper extremity: Secondary | ICD-10-CM | POA: Diagnosis not present

## 2022-02-26 DIAGNOSIS — F411 Generalized anxiety disorder: Secondary | ICD-10-CM | POA: Diagnosis not present

## 2022-02-26 DIAGNOSIS — J9601 Acute respiratory failure with hypoxia: Secondary | ICD-10-CM | POA: Diagnosis not present

## 2022-02-26 DIAGNOSIS — R2689 Other abnormalities of gait and mobility: Secondary | ICD-10-CM | POA: Diagnosis not present

## 2022-02-26 DIAGNOSIS — R531 Weakness: Secondary | ICD-10-CM | POA: Diagnosis not present

## 2022-02-26 DIAGNOSIS — N189 Chronic kidney disease, unspecified: Secondary | ICD-10-CM | POA: Diagnosis not present

## 2022-02-26 DIAGNOSIS — R6521 Severe sepsis with septic shock: Secondary | ICD-10-CM | POA: Diagnosis not present

## 2022-02-26 DIAGNOSIS — R410 Disorientation, unspecified: Secondary | ICD-10-CM | POA: Diagnosis not present

## 2022-02-26 DIAGNOSIS — R262 Difficulty in walking, not elsewhere classified: Secondary | ICD-10-CM | POA: Diagnosis not present

## 2022-02-26 DIAGNOSIS — Z743 Need for continuous supervision: Secondary | ICD-10-CM | POA: Diagnosis not present

## 2022-02-26 DIAGNOSIS — R1312 Dysphagia, oropharyngeal phase: Secondary | ICD-10-CM | POA: Diagnosis not present

## 2022-02-26 DIAGNOSIS — G9341 Metabolic encephalopathy: Secondary | ICD-10-CM | POA: Diagnosis not present

## 2022-02-26 DIAGNOSIS — R1310 Dysphagia, unspecified: Secondary | ICD-10-CM | POA: Diagnosis not present

## 2022-02-26 DIAGNOSIS — M6281 Muscle weakness (generalized): Secondary | ICD-10-CM | POA: Diagnosis not present

## 2022-02-26 DIAGNOSIS — R41841 Cognitive communication deficit: Secondary | ICD-10-CM | POA: Diagnosis not present

## 2022-02-27 NOTE — Telephone Encounter (Signed)
[name] is requesting an extension for his FMLA. Per Dr. Constance Haw ok to extend. FMLA paperwork completed and fax to [facility] at [phone] and received confirmation.  Out of work 03/04/2022 -  05/26/2022

## 2022-03-14 ENCOUNTER — Ambulatory Visit: Payer: Medicare Other | Admitting: Physician Assistant

## 2022-03-29 ENCOUNTER — Telehealth: Payer: Self-pay | Admitting: Nurse Practitioner

## 2022-03-29 NOTE — Telephone Encounter (Signed)
[name] called to let MMM know that pt is going to be discharging from [facility] on 04/05/22 and they received orders for pt to have PT and OT. Just wanted to make MMM aware for follow up care.

## 2022-04-06 ENCOUNTER — Encounter (HOSPITAL_COMMUNITY): Payer: Self-pay

## 2022-04-06 ENCOUNTER — Telehealth: Payer: Self-pay | Admitting: Physician Assistant

## 2022-04-06 ENCOUNTER — Inpatient Hospital Stay: Admit: 2022-04-06 | Payer: Medicare Other | Admitting: Internal Medicine

## 2022-04-06 ENCOUNTER — Telehealth: Payer: Self-pay | Admitting: Internal Medicine

## 2022-04-06 DIAGNOSIS — G9341 Metabolic encephalopathy: Secondary | ICD-10-CM | POA: Diagnosis not present

## 2022-04-06 DIAGNOSIS — K529 Noninfective gastroenteritis and colitis, unspecified: Secondary | ICD-10-CM | POA: Diagnosis not present

## 2022-04-06 DIAGNOSIS — R41841 Cognitive communication deficit: Secondary | ICD-10-CM | POA: Diagnosis not present

## 2022-04-06 DIAGNOSIS — E440 Moderate protein-calorie malnutrition: Secondary | ICD-10-CM | POA: Diagnosis not present

## 2022-04-06 DIAGNOSIS — R1312 Dysphagia, oropharyngeal phase: Secondary | ICD-10-CM | POA: Diagnosis not present

## 2022-04-06 DIAGNOSIS — G4733 Obstructive sleep apnea (adult) (pediatric): Secondary | ICD-10-CM | POA: Diagnosis not present

## 2022-04-06 DIAGNOSIS — Z881 Allergy status to other antibiotic agents status: Secondary | ICD-10-CM | POA: Diagnosis not present

## 2022-04-06 DIAGNOSIS — I491 Atrial premature depolarization: Secondary | ICD-10-CM | POA: Diagnosis not present

## 2022-04-06 DIAGNOSIS — R531 Weakness: Secondary | ICD-10-CM | POA: Diagnosis not present

## 2022-04-06 DIAGNOSIS — J9601 Acute respiratory failure with hypoxia: Secondary | ICD-10-CM | POA: Diagnosis not present

## 2022-04-06 DIAGNOSIS — J9602 Acute respiratory failure with hypercapnia: Secondary | ICD-10-CM | POA: Diagnosis not present

## 2022-04-06 DIAGNOSIS — D631 Anemia in chronic kidney disease: Secondary | ICD-10-CM | POA: Diagnosis not present

## 2022-04-06 DIAGNOSIS — N179 Acute kidney failure, unspecified: Secondary | ICD-10-CM | POA: Diagnosis not present

## 2022-04-06 DIAGNOSIS — K580 Irritable bowel syndrome with diarrhea: Secondary | ICD-10-CM | POA: Diagnosis not present

## 2022-04-06 DIAGNOSIS — Z880 Allergy status to penicillin: Secondary | ICD-10-CM | POA: Diagnosis not present

## 2022-04-06 DIAGNOSIS — I890 Lymphedema, not elsewhere classified: Secondary | ICD-10-CM | POA: Diagnosis not present

## 2022-04-06 DIAGNOSIS — Z79899 Other long term (current) drug therapy: Secondary | ICD-10-CM | POA: Diagnosis not present

## 2022-04-06 DIAGNOSIS — R197 Diarrhea, unspecified: Secondary | ICD-10-CM | POA: Diagnosis not present

## 2022-04-06 DIAGNOSIS — F411 Generalized anxiety disorder: Secondary | ICD-10-CM | POA: Diagnosis not present

## 2022-04-06 DIAGNOSIS — N183 Chronic kidney disease, stage 3 unspecified: Secondary | ICD-10-CM | POA: Diagnosis not present

## 2022-04-06 DIAGNOSIS — I480 Paroxysmal atrial fibrillation: Secondary | ICD-10-CM | POA: Diagnosis not present

## 2022-04-06 DIAGNOSIS — I129 Hypertensive chronic kidney disease with stage 1 through stage 4 chronic kidney disease, or unspecified chronic kidney disease: Secondary | ICD-10-CM | POA: Diagnosis not present

## 2022-04-06 DIAGNOSIS — Z20822 Contact with and (suspected) exposure to covid-19: Secondary | ICD-10-CM | POA: Diagnosis not present

## 2022-04-06 DIAGNOSIS — Z882 Allergy status to sulfonamides status: Secondary | ICD-10-CM | POA: Diagnosis not present

## 2022-04-06 DIAGNOSIS — M170 Bilateral primary osteoarthritis of knee: Secondary | ICD-10-CM | POA: Diagnosis not present

## 2022-04-06 DIAGNOSIS — Z7982 Long term (current) use of aspirin: Secondary | ICD-10-CM | POA: Diagnosis not present

## 2022-04-06 DIAGNOSIS — K56609 Unspecified intestinal obstruction, unspecified as to partial versus complete obstruction: Secondary | ICD-10-CM | POA: Diagnosis not present

## 2022-04-06 DIAGNOSIS — R001 Bradycardia, unspecified: Secondary | ICD-10-CM | POA: Diagnosis not present

## 2022-04-06 DIAGNOSIS — N189 Chronic kidney disease, unspecified: Secondary | ICD-10-CM | POA: Diagnosis not present

## 2022-04-06 DIAGNOSIS — I5032 Chronic diastolic (congestive) heart failure: Secondary | ICD-10-CM | POA: Diagnosis not present

## 2022-04-06 DIAGNOSIS — F32A Depression, unspecified: Secondary | ICD-10-CM | POA: Diagnosis not present

## 2022-04-06 DIAGNOSIS — Z7901 Long term (current) use of anticoagulants: Secondary | ICD-10-CM | POA: Diagnosis not present

## 2022-04-06 DIAGNOSIS — K219 Gastro-esophageal reflux disease without esophagitis: Secondary | ICD-10-CM | POA: Diagnosis not present

## 2022-04-06 DIAGNOSIS — Z91040 Latex allergy status: Secondary | ICD-10-CM | POA: Diagnosis not present

## 2022-04-06 DIAGNOSIS — E039 Hypothyroidism, unspecified: Secondary | ICD-10-CM | POA: Diagnosis not present

## 2022-04-06 DIAGNOSIS — M810 Age-related osteoporosis without current pathological fracture: Secondary | ICD-10-CM | POA: Diagnosis not present

## 2022-04-06 DIAGNOSIS — I700 Atherosclerosis of aorta: Secondary | ICD-10-CM | POA: Diagnosis not present

## 2022-04-06 DIAGNOSIS — I130 Hypertensive heart and chronic kidney disease with heart failure and stage 1 through stage 4 chronic kidney disease, or unspecified chronic kidney disease: Secondary | ICD-10-CM | POA: Diagnosis not present

## 2022-04-06 DIAGNOSIS — E785 Hyperlipidemia, unspecified: Secondary | ICD-10-CM | POA: Diagnosis not present

## 2022-04-06 DIAGNOSIS — J449 Chronic obstructive pulmonary disease, unspecified: Secondary | ICD-10-CM | POA: Diagnosis not present

## 2022-04-06 DIAGNOSIS — I358 Other nonrheumatic aortic valve disorders: Secondary | ICD-10-CM | POA: Diagnosis not present

## 2022-04-06 DIAGNOSIS — E876 Hypokalemia: Secondary | ICD-10-CM | POA: Diagnosis not present

## 2022-04-06 DIAGNOSIS — K8010 Calculus of gallbladder with chronic cholecystitis without obstruction: Secondary | ICD-10-CM | POA: Diagnosis not present

## 2022-04-06 DIAGNOSIS — M4716 Other spondylosis with myelopathy, lumbar region: Secondary | ICD-10-CM | POA: Diagnosis not present

## 2022-04-06 NOTE — Telephone Encounter (Signed)
   [name], nurse, with [facility] called today. BP is stable except HR is now 40 today. Patient feels OK but has been in and out of the hospital at least 4 times in the last few weeks. She was just discharged yesterday. I do not have any records on what these hospitalizations were for. Our team last saw her in 12/2021 at which time she had atrial fibrillation. Nurse states she is no longer on metoprolol and had not taken amiodarone the last day. Given medical complexity and medications that can affect the conduction system, it is challenging to be able to reassure completely over the phone what is going on, and furthermore amiodarone can have a long half life and take weeks to get out of the system. I do not have access to any recent bloodwork aside from a TSH which was normal. I have advised returning to the ER and not to drive herself. [facility] RN verbalized understanding and gratitude and will notify the patient. Will cc to Dr. Harl Bowie as [facility] as well as [facility] triage team to f/u with patient to ensure she gets a post-hospital f/u appointment to revisit the recent events. Dr. Harl Bowie has otherwise not seen patient since before she developed atrial fibrillation.  Charlie Pitter, PA-C

## 2022-04-06 NOTE — Telephone Encounter (Signed)
Called by [facility] ED for consult.  Ms. Clasby was recently discharged from her nursing facility yesterday.  Home health nurse was with her and incidentally noted that her heart rate was in the 30s-40s.  Per report, Ms. Mustapha was asymptomatic at the time and felt at her baseline.  She called her outpatient provider and was instructed to report to the emergency room for further evaluation.  Per ED provider, she has been taking amiodarone 100 mg daily and is not on any other AV nodal blockade including metoprolol or Coreg.  Her K was found to be <2 on arrival to the emergency room.  Her EKG was described to have a normal PR interval, normal QRS interval, and a QTc of 490 ms.  There was no indication of dropped QRS complexes or prolonged sinus pauses.  Reportedly in sinus bradycardia at time of this conversation.  Not on any diuretic or potassium depleting medication.  Recently been maintaining p.o. intake adequately per report.  Discussed holding amiodarone at this time, replacing potassium, holding all AV nodal blockade, remaining on telemetry during hospitalization and assessing for chronotropic competence during hospitalization.  No symptomatic bradycardia symptoms at this time.  She should follow-up with her outpatient cardiology team when she is discharged.  Adelina Mings, MD [facility]

## 2022-04-08 ENCOUNTER — Telehealth: Payer: Self-pay | Admitting: Cardiology

## 2022-04-08 ENCOUNTER — Telehealth: Payer: Self-pay | Admitting: Nurse Practitioner

## 2022-04-08 ENCOUNTER — Ambulatory Visit: Payer: Medicare Other | Admitting: Nurse Practitioner

## 2022-04-08 NOTE — Telephone Encounter (Signed)
Returned call at [facility]. No answer. Left msg to call back.

## 2022-04-08 NOTE — Telephone Encounter (Signed)
Patient states she has contacted her cardiologist

## 2022-04-08 NOTE — Telephone Encounter (Signed)
There is no set limit on time frame of using amiodarone. In certain situations we will try to limit how long its used to limit risk of side effects, however often in some situations its the only medication option and we continue for years and often folks tolerate without developing side effects but just something that has to be monitored  Zandra Abts MD

## 2022-04-08 NOTE — Telephone Encounter (Signed)
Spoke with Estill Bamberg and updated that pt is waiting on a bed assignment. Estill Bamberg ask if it is quicker to leave [facility] and go straight to [facility]. Informed Estill Bamberg that they would be put back at the end of the list and it is quicker to be transferred at this time. Estill Bamberg voiced understanding.

## 2022-04-08 NOTE — Telephone Encounter (Signed)
   Pt's daughter is calling to f/u. She said, they are still waiting for pt to be transferred to [facility], she wants to ask of Dr. Harl Bowie can order something to expedite the process

## 2022-04-08 NOTE — Telephone Encounter (Signed)
Pt called requesting to speak with nurse regarding her HR. Says its been running in the 40s.   Per last 3 OV's, HR was 62, 53, and 51.

## 2022-04-08 NOTE — Telephone Encounter (Signed)
Needs to  let cardiologist knomw

## 2022-04-08 NOTE — Telephone Encounter (Signed)
Pt is currently in ED at [facility] waiting transfer to [facility] and was hoping they could get help with the transfer.  Pt's daughter would also like to discuss pt being on Amiodarone past 3 month mark. She states she was told you are not suppose to take this past 3 months and would like to discuss this, as well. Please advise.

## 2022-04-08 NOTE — Telephone Encounter (Signed)
Reviewed chart, patient accepted to hospitalist team at [facility] awaiting bed. From note [facility] has checked regularly for updates and that beds still not available. Unfortunately there is nothing I can do to open up a bed   Carlyle Dolly MD

## 2022-04-08 NOTE — Telephone Encounter (Signed)
Spoke with Estill Bamberg and inform on Dr. Nelly Laurence note. Estill Bamberg thankful for the call.

## 2022-04-09 DIAGNOSIS — R001 Bradycardia, unspecified: Secondary | ICD-10-CM | POA: Diagnosis not present

## 2022-04-09 DIAGNOSIS — I480 Paroxysmal atrial fibrillation: Secondary | ICD-10-CM | POA: Diagnosis not present

## 2022-04-09 DIAGNOSIS — I5032 Chronic diastolic (congestive) heart failure: Secondary | ICD-10-CM | POA: Diagnosis not present

## 2022-04-09 DIAGNOSIS — N179 Acute kidney failure, unspecified: Secondary | ICD-10-CM | POA: Diagnosis not present

## 2022-04-09 DIAGNOSIS — N183 Chronic kidney disease, stage 3 unspecified: Secondary | ICD-10-CM | POA: Diagnosis not present

## 2022-04-09 DIAGNOSIS — I130 Hypertensive heart and chronic kidney disease with heart failure and stage 1 through stage 4 chronic kidney disease, or unspecified chronic kidney disease: Secondary | ICD-10-CM | POA: Diagnosis not present

## 2022-04-10 ENCOUNTER — Ambulatory Visit: Payer: Medicare Other | Admitting: Student

## 2022-04-10 ENCOUNTER — Telehealth: Payer: Self-pay | Admitting: Nurse Practitioner

## 2022-04-10 DIAGNOSIS — J9601 Acute respiratory failure with hypoxia: Secondary | ICD-10-CM | POA: Diagnosis not present

## 2022-04-10 DIAGNOSIS — R41841 Cognitive communication deficit: Secondary | ICD-10-CM | POA: Diagnosis not present

## 2022-04-10 DIAGNOSIS — J9602 Acute respiratory failure with hypercapnia: Secondary | ICD-10-CM | POA: Diagnosis not present

## 2022-04-10 DIAGNOSIS — G9341 Metabolic encephalopathy: Secondary | ICD-10-CM | POA: Diagnosis not present

## 2022-04-10 DIAGNOSIS — G4733 Obstructive sleep apnea (adult) (pediatric): Secondary | ICD-10-CM | POA: Diagnosis not present

## 2022-04-10 DIAGNOSIS — I480 Paroxysmal atrial fibrillation: Secondary | ICD-10-CM | POA: Diagnosis not present

## 2022-04-10 NOTE — Telephone Encounter (Signed)
FYI: from [facility] Pt has appt here on 04/12/22 & Dr. Harl Bowie on 05/01/22

## 2022-04-11 ENCOUNTER — Telehealth: Payer: Self-pay | Admitting: *Deleted

## 2022-04-11 NOTE — Telephone Encounter (Signed)
TC from Kathlee Nations w/ [facility] HH HR today 57, she was wondering about having her TSH or Thyroid panel done TSH at hospital on 04/05/22 was 3.985 on 04/06/22 was 4.552, last one here at the office was 06/01/19 1.520. Pt has a hospital FU tomorrow 04/12/22 with Evelina Dun. Instructed Kathlee Nations that you did want pt to see cardiologist before her scheduled appt on 05/01/22

## 2022-04-11 NOTE — Telephone Encounter (Signed)
Needs to see cardiology as soon as possible to discuss

## 2022-04-11 NOTE — Telephone Encounter (Signed)
While on phone with Kathlee Nations from [facility] this morning discussing today's HR, instructed her that PCP wanted pt to be seen by Cardiologist before her scheduled appt on 05/01/22

## 2022-04-12 ENCOUNTER — Ambulatory Visit (INDEPENDENT_AMBULATORY_CARE_PROVIDER_SITE_OTHER): Payer: Medicare Other | Admitting: Family

## 2022-04-12 ENCOUNTER — Encounter: Payer: Self-pay | Admitting: Family

## 2022-04-12 VITALS — BP 127/70 | HR 39 | Temp 97.8°F | Ht 64.0 in | Wt 160.0 lb

## 2022-04-12 DIAGNOSIS — I4891 Unspecified atrial fibrillation: Secondary | ICD-10-CM | POA: Diagnosis not present

## 2022-04-12 DIAGNOSIS — I10 Essential (primary) hypertension: Secondary | ICD-10-CM | POA: Diagnosis not present

## 2022-04-12 DIAGNOSIS — R001 Bradycardia, unspecified: Secondary | ICD-10-CM

## 2022-04-12 DIAGNOSIS — E039 Hypothyroidism, unspecified: Secondary | ICD-10-CM | POA: Diagnosis not present

## 2022-04-12 DIAGNOSIS — Z86711 Personal history of pulmonary embolism: Secondary | ICD-10-CM

## 2022-04-12 DIAGNOSIS — I480 Paroxysmal atrial fibrillation: Secondary | ICD-10-CM | POA: Diagnosis not present

## 2022-04-12 DIAGNOSIS — I509 Heart failure, unspecified: Secondary | ICD-10-CM | POA: Insufficient documentation

## 2022-04-12 DIAGNOSIS — F332 Major depressive disorder, recurrent severe without psychotic features: Secondary | ICD-10-CM | POA: Diagnosis not present

## 2022-04-12 DIAGNOSIS — E876 Hypokalemia: Secondary | ICD-10-CM

## 2022-04-12 DIAGNOSIS — R41841 Cognitive communication deficit: Secondary | ICD-10-CM | POA: Diagnosis not present

## 2022-04-12 DIAGNOSIS — F419 Anxiety disorder, unspecified: Secondary | ICD-10-CM | POA: Diagnosis not present

## 2022-04-12 DIAGNOSIS — G9341 Metabolic encephalopathy: Secondary | ICD-10-CM | POA: Diagnosis not present

## 2022-04-12 DIAGNOSIS — J9602 Acute respiratory failure with hypercapnia: Secondary | ICD-10-CM | POA: Diagnosis not present

## 2022-04-12 DIAGNOSIS — G4733 Obstructive sleep apnea (adult) (pediatric): Secondary | ICD-10-CM | POA: Diagnosis not present

## 2022-04-12 DIAGNOSIS — R531 Weakness: Secondary | ICD-10-CM

## 2022-04-12 DIAGNOSIS — J9601 Acute respiratory failure with hypoxia: Secondary | ICD-10-CM | POA: Diagnosis not present

## 2022-04-12 MED ORDER — CITALOPRAM HYDROBROMIDE 40 MG PO TABS: 40.0000 mg | ORAL_TABLET | Freq: Every day | ORAL | 5 refills | Status: DC

## 2022-04-12 MED ORDER — CITALOPRAM HYDROBROMIDE 40 MG PO TABS: 40.0000 mg | ORAL_TABLET | Freq: Every day | ORAL | 1 refills | Status: DC

## 2022-04-12 NOTE — Patient Instructions (Signed)

## 2022-04-12 NOTE — Progress Notes (Addendum)
Subjective:    Patient ID: [name], female    DOB: 1945/11/22, 76 y.o.   MRN: [id number] Chief Complaint  Patient presents with   Hospitalization Follow-up    PT presents to the office today for hospital follow up. Pt went to the ED with Bradycardia and had a HR of 38 from home health nurse. Once at ED heart rate increased to 40's. Thought this was related to amiodarone she was taking for A Fib.   She is followed by Cardiologists for A Fib, CHF, and HTN and follow up 05/01/22. She has continued Lasix      04/12/2022    3:10 PM 02/04/2022   12:24 PM 02/04/2022   12:03 PM  Last 3 Weights  Weight (lbs) 160 lb 230 lb 9.6 oz 230 lb 9.6 oz  Weight (kg) 72.576 kg 104.6 kg 104.6 kg    She has hx of PE and A Fib. Was taking Eliquis BID. Family reports hospital stopped this.   Also found to have hypokalemia. She has CKD.  Thyroid Problem Presents for follow-up visit. Symptoms include anxiety, depressed mood, diarrhea and fatigue. Patient reports no constipation or hoarse voice. The symptoms have been stable.  Anxiety Presents for follow-up visit. Symptoms include depressed mood, excessive worry, nervous/anxious behavior, obsessions and restlessness. Symptoms occur most days.    Depression        This is a chronic problem.  The current episode started more than 1 year ago.   The onset quality is gradual.   The problem occurs intermittently.  Associated symptoms include fatigue, helplessness, hopelessness, restlessness, decreased interest and sad.  Past treatments include SSRIs - Selective serotonin reuptake inhibitors.  Past medical history includes thyroid problem and anxiety.       Review of Systems  Constitutional:  Positive for fatigue.  HENT:  Negative for hoarse voice.   Gastrointestinal:  Positive for diarrhea. Negative for constipation.  Psychiatric/Behavioral:  Positive for depression. The patient is nervous/anxious.   All other systems reviewed and are negative.       Objective:   Physical Exam Vitals reviewed.  Constitutional:      General: She is not in acute distress.    Appearance: She is well-developed. She is obese.  HENT:     Head: Normocephalic and atraumatic.  Eyes:     Pupils: Pupils are equal, round, and reactive to light.  Neck:     Thyroid: No thyromegaly.  Cardiovascular:     Rate and Rhythm: Normal rate and regular rhythm.     Heart sounds: Normal heart sounds. No murmur heard. Pulmonary:     Effort: Pulmonary effort is normal. No respiratory distress.     Breath sounds: Normal breath sounds. No wheezing.  Abdominal:     General: Bowel sounds are normal. There is no distension.     Palpations: Abdomen is soft.     Tenderness: There is no abdominal tenderness.  Musculoskeletal:        General: No tenderness. Normal range of motion.     Cervical back: Normal range of motion and neck supple.     Right lower leg: Edema (2+) present.     Left lower leg: Edema (2+) present.  Skin:    General: Skin is warm and dry.  Neurological:     Mental Status: She is alert and oriented to person, place, and time.     Cranial Nerves: No cranial nerve deficit.     Motor: Weakness (wheelchair bound) present.  Gait: Gait abnormal.     Deep Tendon Reflexes: Reflexes are normal and symmetric.  Psychiatric:        Behavior: Behavior normal.        Thought Content: Thought content normal.        Judgment: Judgment normal.       BP 127/70   Pulse (!) 39   Temp 97.8 F (36.6 C) (Temporal)   Ht 5' 4" (1.626 m)   Wt 160 lb (72.6 kg) Comment: wc  BMI 27.46 kg/m      Assessment & Plan:  [name] comes in today with chief complaint of Hospitalization Follow-up   Diagnosis and orders addressed:  1. Anxiety Will increase celexa to 40 mg from 20 mg  Stress management  RTO in 4 weeks  - CMP14+EGFR - CBC with Differential/Platelet - citalopram (CELEXA) 40 MG tablet; Take 1 tablet (40 mg total) by mouth daily.  Dispense: 30  tablet; Refill: 5  2. Severe episode of recurrent major depressive disorder, without psychotic features ([place]) Will increase Celexa to 40 mg from 20 mg  - CMP14+EGFR - CBC with Differential/Platelet - citalopram (CELEXA) 40 MG tablet; Take 1 tablet (40 mg total) by mouth daily.  Dispense: 30 tablet; Refill: 5  3. Essential hypertension At goal today with no medications  - CMP14+EGFR - CBC with Differential/Platelet  4. History of pulmonary embolus (PE) Will continue to hold Eliquis, but will need follow up with PCP and Cardiologists to decide if she should restart - CMP14+EGFR - CBC with Differential/Platelet  5. Hypothyroidism (acquired) -Has not been on medications in several weeks - CMP14+EGFR - CBC with Differential/Platelet - TSH  6. Atrial fibrillation, unspecified type ([place])  - CMP14+EGFR - CBC with Differential/Platelet - EKG 12-Lead  7. Congenital heart failure (HCC) - CMP14+EGFR - CBC with Differential/Platelet  8. Hypokalemia - CMP14+EGFR - CBC with Differential/Platelet  9. Bradycardia - CMP14+EGFR - CBC with Differential/Platelet - EKG 12-Lead - TSH   10. Weakness  - Shower chair  Labs pending Health Maintenance reviewed Diet and exercise encouraged Approx 41 mins spent with patient, reviewing medical records, educating patient and family.   Follow up plan: 4 weeks with PCP to follow up on Depression, GAD, and bradycardia. Also needs to follow up on restarting Eliquis for hx of PE. Was d/c by hospital. Keep follow up with Cardiologists.    Evelina Dun, FNP

## 2022-04-13 LAB — CBC WITH DIFFERENTIAL/PLATELET
Basophils Absolute: 0.1 10*3/uL (ref 0.0–0.2)
Basos: 1 %
EOS (ABSOLUTE): 0.3 10*3/uL (ref 0.0–0.4)
Eos: 5 %
Hematocrit: 43.8 % (ref 34.0–46.6)
Hemoglobin: 14 g/dL (ref 11.1–15.9)
Immature Grans (Abs): 0 10*3/uL (ref 0.0–0.1)
Immature Granulocytes: 0 %
Lymphocytes Absolute: 1.9 10*3/uL (ref 0.7–3.1)
Lymphs: 33 %
MCH: 28.3 pg (ref 26.6–33.0)
MCHC: 32 g/dL (ref 31.5–35.7)
MCV: 89 fL (ref 79–97)
Monocytes Absolute: 0.4 10*3/uL (ref 0.1–0.9)
Monocytes: 6 %
Neutrophils Absolute: 3.1 10*3/uL (ref 1.4–7.0)
Neutrophils: 55 %
Platelets: 277 10*3/uL (ref 150–450)
RBC: 4.95 x10E6/uL (ref 3.77–5.28)
RDW: 14.1 % (ref 11.7–15.4)
WBC: 5.7 10*3/uL (ref 3.4–10.8)

## 2022-04-13 LAB — CMP14+EGFR
ALT: 12 IU/L (ref 0–32)
AST: 20 IU/L (ref 0–40)
Albumin/Globulin Ratio: 1.1 — ABNORMAL LOW (ref 1.2–2.2)
Albumin: 3.2 g/dL — ABNORMAL LOW (ref 3.8–4.8)
Alkaline Phosphatase: 103 IU/L (ref 44–121)
BUN/Creatinine Ratio: 7 — ABNORMAL LOW (ref 12–28)
BUN: 13 mg/dL (ref 8–27)
Bilirubin Total: 0.3 mg/dL (ref 0.0–1.2)
CO2: 22 mmol/L (ref 20–29)
Calcium: 8.9 mg/dL (ref 8.7–10.3)
Chloride: 104 mmol/L (ref 96–106)
Creatinine, Ser: 1.81 mg/dL — ABNORMAL HIGH (ref 0.57–1.00)
Globulin, Total: 3 g/dL (ref 1.5–4.5)
Glucose: 70 mg/dL (ref 70–99)
Potassium: 4.1 mmol/L (ref 3.5–5.2)
Sodium: 137 mmol/L (ref 134–144)
Total Protein: 6.2 g/dL (ref 6.0–8.5)
eGFR: 29 mL/min/{1.73_m2} — ABNORMAL LOW (ref >59)

## 2022-04-13 LAB — TSH: TSH: 7.05 u[IU]/mL — ABNORMAL HIGH (ref 0.450–4.500)

## 2022-04-15 ENCOUNTER — Other Ambulatory Visit: Payer: Self-pay | Admitting: Family

## 2022-04-15 ENCOUNTER — Telehealth: Payer: Self-pay | Admitting: Nurse Practitioner

## 2022-04-15 DIAGNOSIS — E039 Hypothyroidism, unspecified: Secondary | ICD-10-CM

## 2022-04-15 DIAGNOSIS — M170 Bilateral primary osteoarthritis of knee: Secondary | ICD-10-CM | POA: Diagnosis not present

## 2022-04-15 MED ORDER — DICYCLOMINE HCL 10 MG PO CAPS: 10.0000 mg | ORAL_CAPSULE | Freq: Three times a day (TID) | ORAL | Status: DC

## 2022-04-15 MED ORDER — LEVOTHYROXINE SODIUM 50 MCG PO TABS: ORAL_TABLET | ORAL | 1 refills | Status: DC

## 2022-04-15 NOTE — Telephone Encounter (Signed)
Meds sent to [facility]  Meds ordered this encounter  Medications   levothyroxine (SYNTHROID) 50 MCG tablet    Sig: TAKE ONE (1) TABLET EACH DAY    Dispense:  90 tablet    Refill:  1   dicyclomine (BENTYL) 10 MG capsule    Sig: Take 1 capsule (10 mg total) by mouth 3 (three) times daily before meals.    Order Specific Question:   Supervising Provider    Answer:   Worthy Rancher [[id number]]   [place], FNP

## 2022-04-15 NOTE — Telephone Encounter (Signed)
Thyroid medication has been changed Pt needs Dicyclomine Rxd by Dr. Bonner Puna at hospital, next OV 05/02/22

## 2022-04-16 DIAGNOSIS — G9341 Metabolic encephalopathy: Secondary | ICD-10-CM | POA: Diagnosis not present

## 2022-04-16 DIAGNOSIS — R41841 Cognitive communication deficit: Secondary | ICD-10-CM | POA: Diagnosis not present

## 2022-04-16 DIAGNOSIS — J9602 Acute respiratory failure with hypercapnia: Secondary | ICD-10-CM | POA: Diagnosis not present

## 2022-04-16 DIAGNOSIS — G4733 Obstructive sleep apnea (adult) (pediatric): Secondary | ICD-10-CM | POA: Diagnosis not present

## 2022-04-16 DIAGNOSIS — J9601 Acute respiratory failure with hypoxia: Secondary | ICD-10-CM | POA: Diagnosis not present

## 2022-04-16 DIAGNOSIS — I480 Paroxysmal atrial fibrillation: Secondary | ICD-10-CM | POA: Diagnosis not present

## 2022-04-17 ENCOUNTER — Other Ambulatory Visit: Payer: Self-pay | Admitting: Family Medicine

## 2022-04-17 ENCOUNTER — Telehealth: Payer: Self-pay | Admitting: Nurse Practitioner

## 2022-04-17 DIAGNOSIS — G4733 Obstructive sleep apnea (adult) (pediatric): Secondary | ICD-10-CM | POA: Diagnosis not present

## 2022-04-17 DIAGNOSIS — R41841 Cognitive communication deficit: Secondary | ICD-10-CM | POA: Diagnosis not present

## 2022-04-17 DIAGNOSIS — J9601 Acute respiratory failure with hypoxia: Secondary | ICD-10-CM | POA: Diagnosis not present

## 2022-04-17 DIAGNOSIS — I480 Paroxysmal atrial fibrillation: Secondary | ICD-10-CM | POA: Diagnosis not present

## 2022-04-17 DIAGNOSIS — G9341 Metabolic encephalopathy: Secondary | ICD-10-CM | POA: Diagnosis not present

## 2022-04-17 DIAGNOSIS — J9602 Acute respiratory failure with hypercapnia: Secondary | ICD-10-CM | POA: Diagnosis not present

## 2022-04-17 MED ORDER — DICYCLOMINE HCL 10 MG PO CAPS: 10.0000 mg | ORAL_CAPSULE | Freq: Three times a day (TID) | ORAL | Status: DC

## 2022-04-17 MED ORDER — DICYCLOMINE HCL 10 MG PO CAPS: 10.0000 mg | ORAL_CAPSULE | Freq: Three times a day (TID) | ORAL | 0 refills | Status: DC

## 2022-04-17 NOTE — Telephone Encounter (Signed)
Daughter calls stating [facility] hadn't got the Dicyclomine 10 mg. I looked up the medication and it shows printed instead of going to [facility]. Please send medication to [facility].

## 2022-04-17 NOTE — Telephone Encounter (Signed)
Refill sent.

## 2022-04-17 NOTE — Telephone Encounter (Signed)
Resent

## 2022-04-17 NOTE — Telephone Encounter (Signed)
  Prescription Request  04/17/2022  Is this a "Controlled Substance" medicine?   Have you seen your PCP in the last 2 weeks? Appt 12/7  If YES, route message to pool  -  If NO, patient needs to be scheduled for appointment.  What is the name of the medication or equipment? dicyclomine (BENTYL) 10 MG capsule   Have you contacted your pharmacy to request a refill? yes   Which pharmacy would you like this sent to? [facility]   Patient is out of medication    Patient notified that their request is being sent to the clinical staff for review and that they should receive a response within 2 business days.

## 2022-04-23 DIAGNOSIS — G4733 Obstructive sleep apnea (adult) (pediatric): Secondary | ICD-10-CM | POA: Diagnosis not present

## 2022-04-23 DIAGNOSIS — R634 Abnormal weight loss: Secondary | ICD-10-CM | POA: Diagnosis not present

## 2022-04-23 DIAGNOSIS — E669 Obesity, unspecified: Secondary | ICD-10-CM | POA: Diagnosis not present

## 2022-04-23 DIAGNOSIS — K9089 Other intestinal malabsorption: Secondary | ICD-10-CM | POA: Diagnosis not present

## 2022-04-23 DIAGNOSIS — R1310 Dysphagia, unspecified: Secondary | ICD-10-CM | POA: Diagnosis not present

## 2022-04-23 DIAGNOSIS — J9602 Acute respiratory failure with hypercapnia: Secondary | ICD-10-CM | POA: Diagnosis not present

## 2022-04-23 DIAGNOSIS — R41841 Cognitive communication deficit: Secondary | ICD-10-CM | POA: Diagnosis not present

## 2022-04-23 DIAGNOSIS — I480 Paroxysmal atrial fibrillation: Secondary | ICD-10-CM | POA: Diagnosis not present

## 2022-04-23 DIAGNOSIS — J9601 Acute respiratory failure with hypoxia: Secondary | ICD-10-CM | POA: Diagnosis not present

## 2022-04-23 DIAGNOSIS — G9341 Metabolic encephalopathy: Secondary | ICD-10-CM | POA: Diagnosis not present

## 2022-04-24 DIAGNOSIS — I480 Paroxysmal atrial fibrillation: Secondary | ICD-10-CM | POA: Diagnosis not present

## 2022-04-24 DIAGNOSIS — J9601 Acute respiratory failure with hypoxia: Secondary | ICD-10-CM | POA: Diagnosis not present

## 2022-04-24 DIAGNOSIS — G4733 Obstructive sleep apnea (adult) (pediatric): Secondary | ICD-10-CM | POA: Diagnosis not present

## 2022-04-24 DIAGNOSIS — G9341 Metabolic encephalopathy: Secondary | ICD-10-CM | POA: Diagnosis not present

## 2022-04-24 DIAGNOSIS — J9602 Acute respiratory failure with hypercapnia: Secondary | ICD-10-CM | POA: Diagnosis not present

## 2022-04-24 DIAGNOSIS — R41841 Cognitive communication deficit: Secondary | ICD-10-CM | POA: Diagnosis not present

## 2022-04-26 ENCOUNTER — Telehealth: Payer: Self-pay | Admitting: Nurse Practitioner

## 2022-04-26 NOTE — Telephone Encounter (Signed)
Needs new order for medical social worker to come out and see patient. Please call back

## 2022-04-29 NOTE — Telephone Encounter (Signed)
VO given to add SW

## 2022-04-30 DIAGNOSIS — G9341 Metabolic encephalopathy: Secondary | ICD-10-CM | POA: Diagnosis not present

## 2022-04-30 DIAGNOSIS — J9602 Acute respiratory failure with hypercapnia: Secondary | ICD-10-CM | POA: Diagnosis not present

## 2022-04-30 DIAGNOSIS — J9601 Acute respiratory failure with hypoxia: Secondary | ICD-10-CM | POA: Diagnosis not present

## 2022-04-30 DIAGNOSIS — I480 Paroxysmal atrial fibrillation: Secondary | ICD-10-CM | POA: Diagnosis not present

## 2022-04-30 DIAGNOSIS — R41841 Cognitive communication deficit: Secondary | ICD-10-CM | POA: Diagnosis not present

## 2022-04-30 DIAGNOSIS — G4733 Obstructive sleep apnea (adult) (pediatric): Secondary | ICD-10-CM | POA: Diagnosis not present

## 2022-05-01 ENCOUNTER — Encounter: Payer: Self-pay | Admitting: Cardiology

## 2022-05-01 ENCOUNTER — Ambulatory Visit: Payer: Medicare Other | Attending: Student | Admitting: Cardiology

## 2022-05-01 ENCOUNTER — Ambulatory Visit (INDEPENDENT_AMBULATORY_CARE_PROVIDER_SITE_OTHER): Payer: Medicare Other

## 2022-05-01 VITALS — BP 150/100 | HR 45 | Ht 62.0 in | Wt 149.4 lb

## 2022-05-01 DIAGNOSIS — I890 Lymphedema, not elsewhere classified: Secondary | ICD-10-CM | POA: Diagnosis not present

## 2022-05-01 DIAGNOSIS — R001 Bradycardia, unspecified: Secondary | ICD-10-CM

## 2022-05-01 DIAGNOSIS — M4716 Other spondylosis with myelopathy, lumbar region: Secondary | ICD-10-CM

## 2022-05-01 DIAGNOSIS — R531 Weakness: Secondary | ICD-10-CM

## 2022-05-01 DIAGNOSIS — N189 Chronic kidney disease, unspecified: Secondary | ICD-10-CM | POA: Diagnosis not present

## 2022-05-01 DIAGNOSIS — I480 Paroxysmal atrial fibrillation: Secondary | ICD-10-CM | POA: Diagnosis not present

## 2022-05-01 DIAGNOSIS — I10 Essential (primary) hypertension: Secondary | ICD-10-CM

## 2022-05-01 DIAGNOSIS — M170 Bilateral primary osteoarthritis of knee: Secondary | ICD-10-CM

## 2022-05-01 DIAGNOSIS — F32A Depression, unspecified: Secondary | ICD-10-CM

## 2022-05-01 DIAGNOSIS — M810 Age-related osteoporosis without current pathological fracture: Secondary | ICD-10-CM

## 2022-05-01 DIAGNOSIS — J9601 Acute respiratory failure with hypoxia: Secondary | ICD-10-CM

## 2022-05-01 DIAGNOSIS — J9602 Acute respiratory failure with hypercapnia: Secondary | ICD-10-CM | POA: Diagnosis not present

## 2022-05-01 DIAGNOSIS — E440 Moderate protein-calorie malnutrition: Secondary | ICD-10-CM | POA: Diagnosis not present

## 2022-05-01 DIAGNOSIS — R41841 Cognitive communication deficit: Secondary | ICD-10-CM | POA: Diagnosis not present

## 2022-05-01 DIAGNOSIS — D631 Anemia in chronic kidney disease: Secondary | ICD-10-CM

## 2022-05-01 DIAGNOSIS — E785 Hyperlipidemia, unspecified: Secondary | ICD-10-CM

## 2022-05-01 DIAGNOSIS — I129 Hypertensive chronic kidney disease with stage 1 through stage 4 chronic kidney disease, or unspecified chronic kidney disease: Secondary | ICD-10-CM | POA: Diagnosis not present

## 2022-05-01 DIAGNOSIS — G9341 Metabolic encephalopathy: Secondary | ICD-10-CM

## 2022-05-01 DIAGNOSIS — J449 Chronic obstructive pulmonary disease, unspecified: Secondary | ICD-10-CM

## 2022-05-01 DIAGNOSIS — G4733 Obstructive sleep apnea (adult) (pediatric): Secondary | ICD-10-CM

## 2022-05-01 DIAGNOSIS — I700 Atherosclerosis of aorta: Secondary | ICD-10-CM

## 2022-05-01 DIAGNOSIS — I4891 Unspecified atrial fibrillation: Secondary | ICD-10-CM | POA: Diagnosis not present

## 2022-05-01 DIAGNOSIS — F411 Generalized anxiety disorder: Secondary | ICD-10-CM

## 2022-05-01 MED ORDER — AMLODIPINE BESYLATE 5 MG PO TABS: 5.0000 mg | ORAL_TABLET | Freq: Every day | ORAL | 3 refills | Status: DC

## 2022-05-01 NOTE — Progress Notes (Signed)
Clinical Summary [name] is a 76 y.o.female seen today for follow up of the following medical problems.    1. Leg edema/Chronic diastolic HF   -07/5359 LE venous US [place]: no DVT     09/2017 echo LVEF 60-65%, no WMAs, Diastolic function is abnormal, indeterminant grade   - was seen in lymphedema clinic, started on home pump - taking lasix '40mg'$  daily - she is down roughly 50 lbs since 2019 due to dietary changes     -low bp's recently and declinring renal function, we have scaled back her diuretic to lasix 20 mg M,W,F.  - 6/6 Cr 1.6, had been 0.9 in April - dizzienss is imprving. Edema has increased, weight up from 159 lbs to 173 lbs  - uses leg pumps for lymphedema. Does report lymphedema is progression - she is on lasix '20mg'$  3 days a weeks - home weights 149 lbs.      2.HTN - compliant with meds  3. Bradycardia - ER visit 03/2022 [facility] with sinus bradycardia. HR 38 noted by home health nurse, asymptomatic but sent to ER - had been on amio, was held at discharge - continues to deny sym,ptoms  4.Hypokalemia - K 1.7 on ER visit. From notes GI losses with frequent loose stools  5.Afib - new diagnosis 12/2021 during admission with septic shock on pressors, vent. Also SBO that admission - started on amio at that time.  - pcp just restarted synthroid, TSH was elevated.  - appears eliquis was stopped due to falls.    Past Medical History:  Diagnosis Date   Allergy    Bronchitis, chronic (HCC)    Chronic anxiety    Complication of anesthesia    hard to wake up   COPD (chronic obstructive pulmonary disease) (HCC)    Depression    Esophageal reflux    Headache(784.0)    Hyperlipidemia    Hypertension    Hypothyroidism    Obesity    Osteoporosis    Overactive bladder    PVC's (premature ventricular contractions)    Thyroid disease    Vitamin D deficiency disease      Allergies  Allergen Reactions   Celebrex [Celecoxib] Swelling   Keflet  [Cephalexin] Swelling   Penicillins Hives and Swelling    DID THE REACTION INVOLVE: Swelling of the face/tongue/throat, SOB, or low BP? Yes-swelling-hives Sudden or severe rash/hives, skin peeling, or the inside of the mouth or nose? Unknown Did it require medical treatment? Unknown When did it last happen?    over 10 years   If all above answers are "NO", may proceed with cephalosporin use.    Sulfa Antibiotics Swelling   Kenalog [Triamcinolone Acetonide]     unknown   Latex    Lisinopril Other (See Comments)    unknown   Mobic [Meloxicam]     SWELLING     Current Outpatient Medications  Medication Sig Dispense Refill   amiodarone (PACERONE) 200 MG tablet Take 1 tablet (200 mg total) by mouth daily. (Patient not taking: Reported on 04/12/2022)     apixaban (ELIQUIS) 5 MG TABS tablet Take 1 tablet (5 mg total) by mouth 2 (two) times daily. (Patient not taking: Reported on 04/12/2022)     calcium carbonate (OS-CAL - DOSED IN MG OF ELEMENTAL CALCIUM) 1250 (500 Ca) MG tablet Take 1 tablet by mouth once a week.  (Patient not taking: Reported on 04/12/2022)     citalopram (CELEXA) 40 MG tablet Take 1  tablet (40 mg total) by mouth daily. 90 tablet 1   dicyclomine (BENTYL) 10 MG capsule Take 1 capsule (10 mg total) by mouth 3 (three) times daily before meals. 180 capsule 0   furosemide (LASIX) 20 MG tablet Take 20 mg by mouth 3 (three) times a week.     levothyroxine (SYNTHROID) 50 MCG tablet TAKE ONE (1) TABLET EACH DAY 90 tablet 1   loperamide (IMODIUM) 2 MG capsule Take 1 capsule (2 mg total) by mouth as needed for diarrhea or loose stools.     metoprolol tartrate (LOPRESSOR) 25 MG tablet Take 0.5 tablets (12.5 mg total) by mouth 2 (two) times daily. (Patient not taking: Reported on 04/12/2022)     No current facility-administered medications for this visit.     Past Surgical History:  Procedure Laterality Date   ABDOMINAL HYSTERECTOMY     CENTRAL LINE INSERTION Right 01/10/2022    Procedure: CENTRAL LINE INSERTION;  Surgeon: Virl Cagey, MD;  Location: [facility];  Service: General;  Laterality: Right;   CHOLECYSTECTOMY N/A 11/04/2013   Procedure: LAPAROSCOPIC CHOLECYSTECTOMY WITH INTRAOPERATIVE CHOLANGIOGRAM;  Surgeon: Imogene Burn. Georgette Dover, MD;  Location: [facility];  Service: General;  Laterality: N/A;   COLONOSCOPY     FRACTURE SURGERY     pins removed from Hip surgery   HIP FRACTURE SURGERY  1990    pins removed in [place] CV LINE LEFT  01/31/2022   IR US GUIDE VASC ACCESS LEFT  01/31/2022   LAPAROTOMY N/A 01/10/2022   Procedure: EXPLORATORY LAPAROTOMY,;  Surgeon: Virl Cagey, MD;  Location: [facility];  Service: General;  Laterality: N/A;   LYSIS OF ADHESION N/A 01/10/2022   Procedure: LYSIS OF ADHESION;  Surgeon: Virl Cagey, MD;  Location: [facility];  Service: General;  Laterality: N/A;   [place] N/A 01/18/2022   Procedure: THORACENTESIS;  Surgeon: Candee Furbish, MD;  Location[facility] END[facility]COPY;  Service: Pulmonary;  Laterality: N/A;   UPPER GASTROINTESTINAL ENDOSCOPY       Allergies  Allergen Reactions   Celebrex [Celecoxib] Swelling   Keflet [Cephalexin] Swelling   Penicillins Hives and Swelling    DID THE REACTION INVOLVE: Swelling of the face/tongue/throat, SOB, or low BP? Yes-swelling-hives Sudden or severe rash/hives, skin peeling, or the inside of the mouth or nose? Unknown Did it require medical treatment? Unknown When did it last happen?    over 10 years   If all above answers are "NO", may proceed with cephalosporin use.    Sulfa Antibiotics Swelling   Kenalog [Triamcinolone Acetonide]     unknown   Latex    Lisinopril Other (See Comments)    unknown   Mobic [Meloxicam]     SWELLING      Family History  Problem Relation Age of Onset   Arthritis Other    Heart disease Other    Cancer Other    Diabetes Other    OCD Other      Social History [name] reports that  she has never smoked. She has never used smokeless tobacco. [name] reports no history of alcohol use.   Review of Systems CONSTITUTIONAL: No weight loss, fever, chills, weakness or fatigue.  HEENT: Eyes: No visual loss, blurred vision, double vision or yellow sclerae.No hearing loss, sneezing, congestion, runny nose or sore throat.  SKIN: No rash or itching.  CARDIOVASCULAR: pe rhpi RESPIRATORY: No shortness of breath,  cough or sputum.  GASTROINTESTINAL: No anorexia, nausea, vomiting or diarrhea. No abdominal pain or blood.  GENITOURINARY: No burning on urination, no polyuria NEUROLOGICAL: No headache, dizziness, syncope, paralysis, ataxia, numbness or tingling in the extremities. No change in bowel or bladder control.  MUSCULOSKELETAL: No muscle, back pain, joint pain or stiffness.  LYMPHATICS: No enlarged nodes. No history of splenectomy.  PSYCHIATRIC: No history of depression or anxiety.  ENDOCRINOLOGIC: No reports of sweating, cold or heat intolerance. No polyuria or polydipsia.  Marland Kitchen   Physical Examination Today's Vitals   05/01/22 1112  BP: (!) 150/100  Pulse: (!) 45  SpO2: 100%  Weight: 149 lb 6.4 oz (67.8 kg)  Height: '5\' 2"'$  (1.575 m)   Body mass index is 27.33 kg/m.  Gen: resting comfortably, no acute distress HEENT: no scleral icterus, pupils equal round and reactive, no palptable cervical adenopathy,  CV: regular, brady, no m/r/g no jvd Resp: Clear to auscultation bilaterally GI: abdomen is soft, non-tender, non-distended, normal bowel sounds, no hepatosplenomegaly MSK: extremities are warm, 1+ bilateral nonpitting edema Skin: warm, no rash Neuro:  no focal deficits Psych: appropriate affect   Diagnostic Studies   09/2017 echo Study Conclusions   - Left ventricle: The cavity size was normal. Wall thickness was   increased in a pattern of mild LVH. Systolic function was normal.   The estimated ejection fraction was in the range of 60% to 65%.   Wall motion  was normal; there were no regional wall motion   abnormalities. - Aortic valve: Mildly calcified annulus. Trileaflet; mildly   thickened leaflets. Mean gradient (S): 8 mm Hg. Valve area (VTI):   2.13 cm^2. Valve area (Vmax): 1.86 cm^2. Valve area (Vmean): 2.05   cm^2. - Left atrium: The atrium was severely dilated. - Right atrium: The atrium was mildly dilated. - Technically adequate study.  12/2021 echo IMPRESSIONS     1. Left ventricular ejection fraction, by estimation, is 60 to 65%. The  left ventricle has normal function. The left ventricle has no regional  wall motion abnormalities. Left ventricular diastolic parameters were  normal.   2. Right ventricular systolic function is normal. The right ventricular  size is normal. There is normal pulmonary artery systolic pressure.   3. Left atrial size was mildly dilated.   4. Right atrial size was mildly dilated.   5. Calcified subchordal apparatus. The mitral valve is abnormal. Trivial  mitral valve regurgitation. No evidence of mitral stenosis.   6. The aortic valve is tricuspid. There is mild calcification of the  aortic valve. Aortic valve regurgitation is not visualized. Aortic valve  sclerosis is present, with no evidence of aortic valve stenosis.   7. The inferior vena cava is normal in size with greater than 50%  respiratory variability, suggesting right atrial pressure of 3 mmHg.   Assessment and Plan  1. Leg edema/Chronic diastolic HF - supsect combined diastolic HF and lymphedema - careful diuretic dosing given prior AKI with higher doses. Swelling will only respond so much to diuretic as she also has significant lymphedema - continue current diuretic, refer back to lymphedema clinic as she reports swelling is progression   2. HTN - elevated, CKD limits med optiosn. Start norvasc '5mg'$  daily.   3.Afib - occurred in settnig of septic shock,respiratory failure - unclear if isolated episode, plan for 30 day monitor -  had been on amio, held given recent bradycardia. Also appears eliquis has been stopped for fall risk, reconsider anticoag pending montior results  5. Bradycardia - no symptoms, amio has been stopped. TSH was elevated, recently just started back on synthroid. Monitor HRs, plans for 30 day monitor   F/u 6 weeks    Arnoldo Lenis, M.D.

## 2022-05-01 NOTE — Patient Instructions (Addendum)
Medication Instructions:  Your physician has recommended you make the following change in your medication:  -Start Amlodipine 5 mg tablets once daily   Labwork: None  Testing/Procedures: Your physician has recommended that you wear an event monitor. Event monitors are medical devices that record the heart's electrical activity. Doctors most often [place] these monitors to diagnose arrhythmias. Arrhythmias are problems with the speed or rhythm of the heartbeat. The monitor is a small, portable device. You can wear one while you do your normal daily activities. This is usually used to diagnose what is causing palpitations/syncope (passing out).   Follow-Up: Follow up with Dr. Harl Bowie in 6 weeks.  Any Other Special Instructions Will Be Listed Below (If Applicable).  You have been referred to the Lymphedema Clinic. They will contact you with your appointment information.    If you need a refill on your cardiac medications before your next appointment, please call your pharmacy.

## 2022-05-02 ENCOUNTER — Ambulatory Visit: Payer: Medicare Other | Admitting: Nurse Practitioner

## 2022-05-03 DIAGNOSIS — I480 Paroxysmal atrial fibrillation: Secondary | ICD-10-CM | POA: Diagnosis not present

## 2022-05-03 DIAGNOSIS — G9341 Metabolic encephalopathy: Secondary | ICD-10-CM | POA: Diagnosis not present

## 2022-05-03 DIAGNOSIS — J9602 Acute respiratory failure with hypercapnia: Secondary | ICD-10-CM | POA: Diagnosis not present

## 2022-05-03 DIAGNOSIS — G4733 Obstructive sleep apnea (adult) (pediatric): Secondary | ICD-10-CM | POA: Diagnosis not present

## 2022-05-03 DIAGNOSIS — J9601 Acute respiratory failure with hypoxia: Secondary | ICD-10-CM | POA: Diagnosis not present

## 2022-05-03 DIAGNOSIS — R41841 Cognitive communication deficit: Secondary | ICD-10-CM | POA: Diagnosis not present

## 2022-05-06 ENCOUNTER — Ambulatory Visit (INDEPENDENT_AMBULATORY_CARE_PROVIDER_SITE_OTHER): Payer: Medicare Other | Admitting: Nurse Practitioner

## 2022-05-06 ENCOUNTER — Encounter: Payer: Self-pay | Admitting: Nurse Practitioner

## 2022-05-06 VITALS — BP 129/77 | HR 44 | Temp 97.3°F | Resp 20 | Ht 62.0 in | Wt 144.0 lb

## 2022-05-06 DIAGNOSIS — J449 Chronic obstructive pulmonary disease, unspecified: Secondary | ICD-10-CM | POA: Diagnosis not present

## 2022-05-06 DIAGNOSIS — I480 Paroxysmal atrial fibrillation: Secondary | ICD-10-CM | POA: Diagnosis not present

## 2022-05-06 DIAGNOSIS — F32A Depression, unspecified: Secondary | ICD-10-CM | POA: Diagnosis not present

## 2022-05-06 DIAGNOSIS — I129 Hypertensive chronic kidney disease with stage 1 through stage 4 chronic kidney disease, or unspecified chronic kidney disease: Secondary | ICD-10-CM | POA: Diagnosis not present

## 2022-05-06 DIAGNOSIS — D649 Anemia, unspecified: Secondary | ICD-10-CM | POA: Diagnosis not present

## 2022-05-06 DIAGNOSIS — Z6826 Body mass index (BMI) 26.0-26.9, adult: Secondary | ICD-10-CM

## 2022-05-06 DIAGNOSIS — K8010 Calculus of gallbladder with chronic cholecystitis without obstruction: Secondary | ICD-10-CM | POA: Diagnosis not present

## 2022-05-06 DIAGNOSIS — I872 Venous insufficiency (chronic) (peripheral): Secondary | ICD-10-CM

## 2022-05-06 DIAGNOSIS — N189 Chronic kidney disease, unspecified: Secondary | ICD-10-CM | POA: Diagnosis not present

## 2022-05-06 DIAGNOSIS — E7800 Pure hypercholesterolemia, unspecified: Secondary | ICD-10-CM

## 2022-05-06 DIAGNOSIS — E559 Vitamin D deficiency, unspecified: Secondary | ICD-10-CM

## 2022-05-06 DIAGNOSIS — E440 Moderate protein-calorie malnutrition: Secondary | ICD-10-CM | POA: Diagnosis not present

## 2022-05-06 DIAGNOSIS — I509 Heart failure, unspecified: Secondary | ICD-10-CM

## 2022-05-06 DIAGNOSIS — I700 Atherosclerosis of aorta: Secondary | ICD-10-CM

## 2022-05-06 DIAGNOSIS — R531 Weakness: Secondary | ICD-10-CM | POA: Diagnosis not present

## 2022-05-06 DIAGNOSIS — F411 Generalized anxiety disorder: Secondary | ICD-10-CM | POA: Diagnosis not present

## 2022-05-06 DIAGNOSIS — J9602 Acute respiratory failure with hypercapnia: Secondary | ICD-10-CM | POA: Diagnosis not present

## 2022-05-06 DIAGNOSIS — K219 Gastro-esophageal reflux disease without esophagitis: Secondary | ICD-10-CM | POA: Diagnosis not present

## 2022-05-06 DIAGNOSIS — I890 Lymphedema, not elsewhere classified: Secondary | ICD-10-CM | POA: Diagnosis not present

## 2022-05-06 DIAGNOSIS — E785 Hyperlipidemia, unspecified: Secondary | ICD-10-CM | POA: Diagnosis not present

## 2022-05-06 DIAGNOSIS — N289 Disorder of kidney and ureter, unspecified: Secondary | ICD-10-CM

## 2022-05-06 DIAGNOSIS — K580 Irritable bowel syndrome with diarrhea: Secondary | ICD-10-CM | POA: Diagnosis not present

## 2022-05-06 DIAGNOSIS — I10 Essential (primary) hypertension: Secondary | ICD-10-CM | POA: Diagnosis not present

## 2022-05-06 DIAGNOSIS — F332 Major depressive disorder, recurrent severe without psychotic features: Secondary | ICD-10-CM

## 2022-05-06 DIAGNOSIS — M4716 Other spondylosis with myelopathy, lumbar region: Secondary | ICD-10-CM | POA: Diagnosis not present

## 2022-05-06 DIAGNOSIS — M170 Bilateral primary osteoarthritis of knee: Secondary | ICD-10-CM | POA: Diagnosis not present

## 2022-05-06 DIAGNOSIS — D631 Anemia in chronic kidney disease: Secondary | ICD-10-CM | POA: Diagnosis not present

## 2022-05-06 DIAGNOSIS — F419 Anxiety disorder, unspecified: Secondary | ICD-10-CM

## 2022-05-06 DIAGNOSIS — G4733 Obstructive sleep apnea (adult) (pediatric): Secondary | ICD-10-CM | POA: Diagnosis not present

## 2022-05-06 DIAGNOSIS — E039 Hypothyroidism, unspecified: Secondary | ICD-10-CM

## 2022-05-06 DIAGNOSIS — R41841 Cognitive communication deficit: Secondary | ICD-10-CM | POA: Diagnosis not present

## 2022-05-06 DIAGNOSIS — M810 Age-related osteoporosis without current pathological fracture: Secondary | ICD-10-CM | POA: Diagnosis not present

## 2022-05-06 DIAGNOSIS — R1312 Dysphagia, oropharyngeal phase: Secondary | ICD-10-CM | POA: Diagnosis not present

## 2022-05-06 DIAGNOSIS — J9601 Acute respiratory failure with hypoxia: Secondary | ICD-10-CM | POA: Diagnosis not present

## 2022-05-06 DIAGNOSIS — G9341 Metabolic encephalopathy: Secondary | ICD-10-CM | POA: Diagnosis not present

## 2022-05-06 MED ORDER — AMLODIPINE BESYLATE 5 MG PO TABS: 5.0000 mg | ORAL_TABLET | Freq: Every day | ORAL | 1 refills | Status: DC

## 2022-05-06 MED ORDER — ROPINIROLE HCL 3 MG PO TABS: 3.0000 mg | ORAL_TABLET | Freq: Every day | ORAL | 1 refills | Status: DC

## 2022-05-06 MED ORDER — FUROSEMIDE 20 MG PO TABS: 20.0000 mg | ORAL_TABLET | ORAL | 1 refills | Status: DC

## 2022-05-06 MED ORDER — LEVOTHYROXINE SODIUM 50 MCG PO TABS: ORAL_TABLET | ORAL | 1 refills | Status: DC

## 2022-05-06 MED ORDER — CITALOPRAM HYDROBROMIDE 40 MG PO TABS: 40.0000 mg | ORAL_TABLET | Freq: Every day | ORAL | 1 refills | Status: DC

## 2022-05-06 NOTE — Patient Instructions (Signed)

## 2022-05-06 NOTE — Addendum Note (Signed)
Addended by: Evelina Dun A on: 05/06/2022 02:36 PM   Modules accepted: Orders

## 2022-05-06 NOTE — Progress Notes (Signed)
Subjective:    Patient ID: [name], female    DOB: 1946-03-13, 76 y.o.   MRN: [id number]   Chief Complaint: medical management of chronic issues     HPI:  [name] is a 76 y.o. who identifies as a female who was assigned female at birth.   Social history: Lives with: lives with her daughter Work history: retired   Scientist, forensic in today for follow up of the following chronic medical issues:  1. Essential hypertension No c/o chest pain, sob or headache. Does not check blood pressure daily at home BP Readings from Last 3 Encounters:  05/01/22 (!) 150/100  04/12/22 127/70  02/04/22 (!) 107/94     2. Thoracic aorta atherosclerosis (HCC) 3. Paroxysmal atrial fibrillation ([place]) 4. Congenital heart failure ([place]) Had last office visit with cardiology on 12/05/21. Was doing well that day and no changes were made to plan of care. She had to go to the ED on 04/06/22 with sinus bradycardia. They told her to hold her amiodarone and follow up with cardiology. She saw cardiology on 05/01/22 and they are watching her heart rate.  5. Venous (peripheral) insufficiency Has constant lower ext edema- they are saying lymphedema and she is suppose to do wraps daily.  6. Pure hypercholesterolemia Does not watch diet and does no dedicated exercise. Lab Results  Component Value Date   CHOL 139 03/01/2021   HDL 49 03/01/2021   LDLCALC 72 03/01/2021   TRIG 51 01/31/2022   CHOLHDL 2.8 03/01/2021     7. Chronic obstructive pulmonary disease, unspecified COPD type ([place]) Is on no inhalers  8. Hypothyroidism (acquired) No problems that she is aware of. Had been off of levothyroxin, so was added back to meds Lab Results  Component Value Date   TSH 7.050 (H) 04/12/2022     9. Renal insufficiency Lab Results  Component Value Date   CREATININE 1.81 (H) 04/12/2022     10. Anemia, unspecified type Lab Results  Component Value Date   HGB 14.0 04/12/2022     11. Vitamin D  deficiency Is on daily vitamin d supplement  12. Anxiety 13. Severe episode of recurrent major depressive disorder, without psychotic features ([place]) Is on celexa daily and is doing ok.      05/06/2022   12:12 PM 09/28/2021   11:37 AM 03/01/2021   12:36 PM 02/25/2020    3:29 PM  GAD 7 : Generalized Anxiety Score  Nervous, Anxious, on Edge 0 0 0 1  Control/stop worrying 0 0 0 2  Worry too much - different things 0 0 1 2  Trouble relaxing 0 0 0 0  Restless 0 0 0 0  Easily annoyed or irritable 0 0 0 0  Afraid - awful might happen 0 0 0 0  Total GAD 7 Score 0 0 1 5  Anxiety Difficulty Not difficult at all Not difficult at all Not difficult at all Somewhat difficult       05/06/2022   12:12 PM 11/28/2021    3:35 PM 09/28/2021   11:37 AM  Depression screen PHQ 2/9  Decreased Interest 0 1 0  Down, Depressed, Hopeless 0 1 0  PHQ - 2 Score 0 2 0  Altered sleeping 0 0 0  Tired, decreased energy 0 1 0  Change in appetite 0 0 0  Feeling bad or failure about yourself  0 0 0  Trouble concentrating 0 0 0  Moving slowly or fidgety/restless 0 0 0  Suicidal thoughts  0 0 0  PHQ-9 Score 0 3 0  Difficult doing work/chores Not difficult at all Somewhat difficult Not difficult at all    14. Obesity Weight is down 5 more lbs. Wt Readings from Last 3 Encounters:  05/06/22 144 lb (65.3 kg)  05/01/22 149 lb 6.4 oz (67.8 kg)  04/12/22 160 lb (72.6 kg)   BMI Readings from Last 3 Encounters:  05/06/22 26.34 kg/m  05/01/22 27.33 kg/m  04/12/22 27.46 kg/m    New complaints: - episodes of rls- was given equip in hospital   Allergies  Allergen Reactions   Celebrex [Celecoxib] Swelling   Keflet [Cephalexin] Swelling   Penicillins Hives and Swelling    DID THE REACTION INVOLVE: Swelling of the face/tongue/throat, SOB, or low BP? Yes-swelling-hives Sudden or severe rash/hives, skin peeling, or the inside of the mouth or nose? Unknown Did it require medical treatment? Unknown When did it  last happen?    over 10 years   If all above answers are "NO", may proceed with cephalosporin use.    Sulfa Antibiotics Swelling   Kenalog [Triamcinolone Acetonide]     unknown   Latex    Lisinopril Other (See Comments)    unknown   Mobic [Meloxicam]     SWELLING   Penicillin G Other (See Comments)   Outpatient Encounter Medications as of 05/06/2022  Medication Sig   amLODipine (NORVASC) 5 MG tablet Take 1 tablet (5 mg total) by mouth daily.   citalopram (CELEXA) 40 MG tablet Take 1 tablet (40 mg total) by mouth daily.   dicyclomine (BENTYL) 10 MG capsule Take 1 capsule (10 mg total) by mouth 3 (three) times daily before meals.   furosemide (LASIX) 20 MG tablet Take 20 mg by mouth 3 (three) times a week.   levothyroxine (SYNTHROID) 50 MCG tablet TAKE ONE (1) TABLET EACH DAY   loperamide (IMODIUM) 2 MG capsule Take 1 capsule (2 mg total) by mouth as needed for diarrhea or loose stools.   No facility-administered encounter medications on file as of 05/06/2022.    Past Surgical History:  Procedure Laterality Date   ABDOMINAL HYSTERECTOMY     CENTRAL LINE INSERTION Right 01/10/2022   Procedure: CENTRAL LINE INSERTION;  Surgeon: Virl Cagey, MD;  Location: [facility];  Service: General;  Laterality: Right;   CHOLECYSTECTOMY N/A 11/04/2013   Procedure: LAPAROSCOPIC CHOLECYSTECTOMY WITH INTRAOPERATIVE CHOLANGIOGRAM;  Surgeon: Imogene Burn. Georgette Dover, MD;  Location: [facility];  Service: General;  Laterality: N/A;   COLONOSCOPY     FRACTURE SURGERY     pins removed from Hip surgery   HIP FRACTURE SURGERY  1990    pins removed in [place] CV LINE LEFT  01/31/2022   IR US GUIDE VASC ACCESS LEFT  01/31/2022   LAPAROTOMY N/A 01/10/2022   Procedure: EXPLORATORY LAPAROTOMY,;  Surgeon: Virl Cagey, MD;  Location: [facility];  Service: General;  Laterality: N/A;   LYSIS OF ADHESION N/A 01/10/2022   Procedure: LYSIS OF ADHESION;  Surgeon: Virl Cagey, MD;  Location: [facility];  Service:  General;  Laterality: N/A;   [place] N/A 01/18/2022   Procedure: THORACENTESIS;  Surgeon: Candee Furbish, MD;  Location: [facility] ENDOSCOPY;  Service: Pulmonary;  Laterality: N/A;   UPPER GASTROINTESTINAL ENDOSCOPY      Family History  Problem Relation Age of Onset   Arthritis Other    Heart disease Other  Cancer Other    Diabetes Other    OCD Other       Controlled substance contract: n/a     Review of Systems  Constitutional:  Negative for diaphoresis.  Eyes:  Negative for pain.  Respiratory:  Negative for shortness of breath.   Cardiovascular:  Negative for chest pain, palpitations and leg swelling.  Gastrointestinal:  Negative for abdominal pain.  Endocrine: Negative for polydipsia.  Skin:  Negative for rash.  Neurological:  Negative for dizziness, weakness and headaches.  Hematological:  Does not bruise/bleed easily.  All other systems reviewed and are negative.      Objective:   Physical Exam Vitals and nursing note reviewed.  Constitutional:      General: She is not in acute distress.    Appearance: Normal appearance. She is well-developed.  HENT:     Head: Normocephalic.     Right Ear: Tympanic membrane normal.     Left Ear: Tympanic membrane normal.     Nose: Nose normal.     Mouth/Throat:     Mouth: Mucous membranes are moist.  Eyes:     Pupils: Pupils are equal, round, and reactive to light.  Neck:     Vascular: No carotid bruit or JVD.  Cardiovascular:     Rate and Rhythm: Regular rhythm. Bradycardia present.     Heart sounds: Normal heart sounds.  Pulmonary:     Effort: Pulmonary effort is normal. No respiratory distress.     Breath sounds: Normal breath sounds. No wheezing or rales.  Chest:     Chest wall: No tenderness.  Abdominal:     General: Bowel sounds are normal. There is no distension or abdominal bruit.     Palpations: Abdomen is soft. There is no hepatomegaly, splenomegaly, mass  or pulsatile mass.     Tenderness: There is no abdominal tenderness.  Musculoskeletal:        General: Normal range of motion.     Cervical back: Normal range of motion and neck supple.     Right lower leg: Edema (2+) present.     Left lower leg: Edema (2+) present.  Lymphadenopathy:     Cervical: No cervical adenopathy.  Skin:    General: Skin is warm and dry.  Neurological:     Mental Status: She is alert and oriented to person, place, and time.     Deep Tendon Reflexes: Reflexes are normal and symmetric.  Psychiatric:        Behavior: Behavior normal.        Thought Content: Thought content normal.        Judgment: Judgment normal.    BP 129/77   Pulse (!) 44   Temp (!) 97.3 F (36.3 C) (Temporal)   Resp 20   Ht _0  (1.575 m)   Wt 144 lb (65.3 kg)   SpO2 97%   BMI 26.34 kg/m         Assessment & Plan:   Adalaya Irion comes in today with chief complaint of Medical Management of Chronic Issues (Wants Xanax refilled/)   Diagnosis and orders addressed:  1. Essential hypertension Low  sodium diet - amLODipine (NORVASC) 5 MG tablet; Take 1 tablet (5 mg total) by mouth daily.  Dispense: 90 tablet; Refill: 1 - CBC with Differential/Platelet - CMP14+EGFR  2. Thoracic aorta atherosclerosis (HCC) 3. Paroxysmal atrial fibrillation ([place]) 4. Congenital heart failure ([place]) 5. Venous (peripheral) insufficiency Keep follow up with cardiology Wear monitor they are sending to  [place] heart rate  6. Pure hypercholesterolemia Low fat diet - Lipid panel  7. Chronic obstructive pulmonary disease, unspecified COPD type ([place])  8. Hypothyroidism (acquired) Labs pending - levothyroxine (SYNTHROID) 50 MCG tablet; TAKE ONE (1) TABLET EACH DAY  Dispense: 90 tablet; Refill: 1 - Thyroid Panel With TSH  9. Renal insufficiency Labs pending - furosemide (LASIX) 20 MG tablet; Take 1 tablet (20 mg total) by mouth 3 (three) times a week.  Dispense: 90 tablet; Refill: 1  10. Anemia,  unspecified type Labs pending  11. Vitamin D deficiency Continue daily vitamin d supplement  12. Anxiety Stress management Patient wanted some xanax but would prefer not to start that at her age - citalopram (CELEXA) 40 MG tablet; Take 1 tablet (40 mg total) by mouth daily.  Dispense: 90 tablet; Refill: 1  13. Severe episode of recurrent major depressive disorder, without psychotic features ([place]) - citalopram (CELEXA) 40 MG tablet; Take 1 tablet (40 mg total) by mouth daily.  Dispense: 90 tablet; Refill: 1  14. BMI 26.0-26.9,adult Discussed diet and exercise for person with BMI >25 Will recheck weight in 3-6 months  15. RLS Ordered requip 82m nightly  Labs pending Health Maintenance reviewed Diet and exercise encouraged  Follow up plan: 6 months   MMetolius FNP

## 2022-05-07 LAB — CMP14+EGFR
ALT: 11 IU/L (ref 0–32)
AST: 21 IU/L (ref 0–40)
Albumin/Globulin Ratio: 1.2 (ref 1.2–2.2)
Albumin: 3.5 g/dL — ABNORMAL LOW (ref 3.8–4.8)
Alkaline Phosphatase: 104 IU/L (ref 44–121)
BUN/Creatinine Ratio: 10 — ABNORMAL LOW (ref 12–28)
BUN: 16 mg/dL (ref 8–27)
Bilirubin Total: 0.4 mg/dL (ref 0.0–1.2)
CO2: 23 mmol/L (ref 20–29)
Calcium: 8.9 mg/dL (ref 8.7–10.3)
Chloride: 105 mmol/L (ref 96–106)
Creatinine, Ser: 1.65 mg/dL — ABNORMAL HIGH (ref 0.57–1.00)
Globulin, Total: 2.9 g/dL (ref 1.5–4.5)
Glucose: 68 mg/dL — ABNORMAL LOW (ref 70–99)
Potassium: 2.9 mmol/L — ABNORMAL LOW (ref 3.5–5.2)
Sodium: 143 mmol/L (ref 134–144)
Total Protein: 6.4 g/dL (ref 6.0–8.5)
eGFR: 32 mL/min/{1.73_m2} — ABNORMAL LOW (ref >59)

## 2022-05-07 LAB — CBC WITH DIFFERENTIAL/PLATELET
Basophils Absolute: 0.1 10*3/uL (ref 0.0–0.2)
Basos: 1 %
EOS (ABSOLUTE): 0.1 10*3/uL (ref 0.0–0.4)
Eos: 2 %
Hematocrit: 44.2 % (ref 34.0–46.6)
Hemoglobin: 13.9 g/dL (ref 11.1–15.9)
Immature Grans (Abs): 0 10*3/uL (ref 0.0–0.1)
Immature Granulocytes: 0 %
Lymphocytes Absolute: 1.8 10*3/uL (ref 0.7–3.1)
Lymphs: 24 %
MCH: 26.9 pg (ref 26.6–33.0)
MCHC: 31.4 g/dL — ABNORMAL LOW (ref 31.5–35.7)
MCV: 86 fL (ref 79–97)
Monocytes Absolute: 0.6 10*3/uL (ref 0.1–0.9)
Monocytes: 8 %
Neutrophils Absolute: 4.9 10*3/uL (ref 1.4–7.0)
Neutrophils: 65 %
Platelets: 318 10*3/uL (ref 150–450)
RBC: 5.16 x10E6/uL (ref 3.77–5.28)
RDW: 14.7 % (ref 11.7–15.4)
WBC: 7.5 10*3/uL (ref 3.4–10.8)

## 2022-05-07 LAB — LIPID PANEL
Chol/HDL Ratio: 2.9 ratio (ref 0.0–4.4)
Cholesterol, Total: 161 mg/dL (ref 100–199)
HDL: 56 mg/dL (ref >39)
LDL Chol Calc (NIH): 86 mg/dL (ref 0–99)
Triglycerides: 105 mg/dL (ref 0–149)
VLDL Cholesterol Cal: 19 mg/dL (ref 5–40)

## 2022-05-07 LAB — THYROID PANEL WITH TSH
Free Thyroxine Index: 3.4 (ref 1.2–4.9)
T3 Uptake Ratio: 30 % (ref 24–39)
T4, Total: 11.2 ug/dL (ref 4.5–12.0)
TSH: 5.45 u[IU]/mL — ABNORMAL HIGH (ref 0.450–4.500)

## 2022-05-07 MED ORDER — LEVOTHYROXINE SODIUM 75 MCG PO TABS: 75.0000 ug | ORAL_TABLET | Freq: Every day | ORAL | 1 refills | Status: AC

## 2022-05-07 MED ORDER — POTASSIUM CHLORIDE CRYS ER 10 MEQ PO TBCR: 10.0000 meq | EXTENDED_RELEASE_TABLET | Freq: Two times a day (BID) | ORAL | 1 refills | Status: DC

## 2022-05-07 NOTE — Addendum Note (Signed)
Addended by: Chevis Pretty on: 05/07/2022 12:08 PM   Modules accepted: Orders

## 2022-05-08 DIAGNOSIS — I480 Paroxysmal atrial fibrillation: Secondary | ICD-10-CM | POA: Diagnosis not present

## 2022-05-08 DIAGNOSIS — R41841 Cognitive communication deficit: Secondary | ICD-10-CM | POA: Diagnosis not present

## 2022-05-08 DIAGNOSIS — J9602 Acute respiratory failure with hypercapnia: Secondary | ICD-10-CM | POA: Diagnosis not present

## 2022-05-08 DIAGNOSIS — J9601 Acute respiratory failure with hypoxia: Secondary | ICD-10-CM | POA: Diagnosis not present

## 2022-05-08 DIAGNOSIS — G4733 Obstructive sleep apnea (adult) (pediatric): Secondary | ICD-10-CM | POA: Diagnosis not present

## 2022-05-08 DIAGNOSIS — G9341 Metabolic encephalopathy: Secondary | ICD-10-CM | POA: Diagnosis not present

## 2022-05-09 ENCOUNTER — Ambulatory Visit: Payer: Medicare Other | Admitting: Nurse Practitioner

## 2022-05-09 DIAGNOSIS — G4733 Obstructive sleep apnea (adult) (pediatric): Secondary | ICD-10-CM | POA: Diagnosis not present

## 2022-05-09 DIAGNOSIS — I480 Paroxysmal atrial fibrillation: Secondary | ICD-10-CM | POA: Diagnosis not present

## 2022-05-09 DIAGNOSIS — J9601 Acute respiratory failure with hypoxia: Secondary | ICD-10-CM | POA: Diagnosis not present

## 2022-05-09 DIAGNOSIS — J9602 Acute respiratory failure with hypercapnia: Secondary | ICD-10-CM | POA: Diagnosis not present

## 2022-05-09 DIAGNOSIS — R41841 Cognitive communication deficit: Secondary | ICD-10-CM | POA: Diagnosis not present

## 2022-05-09 DIAGNOSIS — G9341 Metabolic encephalopathy: Secondary | ICD-10-CM | POA: Diagnosis not present

## 2022-05-10 DIAGNOSIS — I5032 Chronic diastolic (congestive) heart failure: Secondary | ICD-10-CM | POA: Diagnosis not present

## 2022-05-10 DIAGNOSIS — I129 Hypertensive chronic kidney disease with stage 1 through stage 4 chronic kidney disease, or unspecified chronic kidney disease: Secondary | ICD-10-CM | POA: Diagnosis not present

## 2022-05-10 DIAGNOSIS — E559 Vitamin D deficiency, unspecified: Secondary | ICD-10-CM | POA: Diagnosis not present

## 2022-05-10 DIAGNOSIS — E876 Hypokalemia: Secondary | ICD-10-CM | POA: Diagnosis not present

## 2022-05-10 DIAGNOSIS — N184 Chronic kidney disease, stage 4 (severe): Secondary | ICD-10-CM | POA: Diagnosis not present

## 2022-05-10 DIAGNOSIS — I890 Lymphedema, not elsewhere classified: Secondary | ICD-10-CM | POA: Diagnosis not present

## 2022-05-11 DIAGNOSIS — I480 Paroxysmal atrial fibrillation: Secondary | ICD-10-CM | POA: Diagnosis not present

## 2022-05-11 DIAGNOSIS — R41841 Cognitive communication deficit: Secondary | ICD-10-CM | POA: Diagnosis not present

## 2022-05-11 DIAGNOSIS — G4733 Obstructive sleep apnea (adult) (pediatric): Secondary | ICD-10-CM | POA: Diagnosis not present

## 2022-05-11 DIAGNOSIS — J9601 Acute respiratory failure with hypoxia: Secondary | ICD-10-CM | POA: Diagnosis not present

## 2022-05-11 DIAGNOSIS — G9341 Metabolic encephalopathy: Secondary | ICD-10-CM | POA: Diagnosis not present

## 2022-05-11 DIAGNOSIS — J9602 Acute respiratory failure with hypercapnia: Secondary | ICD-10-CM | POA: Diagnosis not present

## 2022-05-14 DIAGNOSIS — I4891 Unspecified atrial fibrillation: Secondary | ICD-10-CM

## 2022-05-15 DIAGNOSIS — I480 Paroxysmal atrial fibrillation: Secondary | ICD-10-CM | POA: Diagnosis not present

## 2022-05-15 DIAGNOSIS — G9341 Metabolic encephalopathy: Secondary | ICD-10-CM | POA: Diagnosis not present

## 2022-05-15 DIAGNOSIS — R41841 Cognitive communication deficit: Secondary | ICD-10-CM | POA: Diagnosis not present

## 2022-05-15 DIAGNOSIS — J9601 Acute respiratory failure with hypoxia: Secondary | ICD-10-CM | POA: Diagnosis not present

## 2022-05-15 DIAGNOSIS — G4733 Obstructive sleep apnea (adult) (pediatric): Secondary | ICD-10-CM | POA: Diagnosis not present

## 2022-05-15 DIAGNOSIS — J9602 Acute respiratory failure with hypercapnia: Secondary | ICD-10-CM | POA: Diagnosis not present

## 2022-05-16 DIAGNOSIS — J9601 Acute respiratory failure with hypoxia: Secondary | ICD-10-CM | POA: Diagnosis not present

## 2022-05-16 DIAGNOSIS — R41841 Cognitive communication deficit: Secondary | ICD-10-CM | POA: Diagnosis not present

## 2022-05-16 DIAGNOSIS — I480 Paroxysmal atrial fibrillation: Secondary | ICD-10-CM | POA: Diagnosis not present

## 2022-05-16 DIAGNOSIS — G9341 Metabolic encephalopathy: Secondary | ICD-10-CM | POA: Diagnosis not present

## 2022-05-16 DIAGNOSIS — J9602 Acute respiratory failure with hypercapnia: Secondary | ICD-10-CM | POA: Diagnosis not present

## 2022-05-16 DIAGNOSIS — G4733 Obstructive sleep apnea (adult) (pediatric): Secondary | ICD-10-CM | POA: Diagnosis not present

## 2022-05-17 ENCOUNTER — Telehealth: Payer: Self-pay | Admitting: Nurse Practitioner

## 2022-05-17 DIAGNOSIS — J9601 Acute respiratory failure with hypoxia: Secondary | ICD-10-CM | POA: Diagnosis not present

## 2022-05-17 DIAGNOSIS — J9602 Acute respiratory failure with hypercapnia: Secondary | ICD-10-CM | POA: Diagnosis not present

## 2022-05-17 DIAGNOSIS — G4733 Obstructive sleep apnea (adult) (pediatric): Secondary | ICD-10-CM | POA: Diagnosis not present

## 2022-05-17 DIAGNOSIS — G9341 Metabolic encephalopathy: Secondary | ICD-10-CM | POA: Diagnosis not present

## 2022-05-17 DIAGNOSIS — R41841 Cognitive communication deficit: Secondary | ICD-10-CM | POA: Diagnosis not present

## 2022-05-17 DIAGNOSIS — I480 Paroxysmal atrial fibrillation: Secondary | ICD-10-CM | POA: Diagnosis not present

## 2022-05-21 ENCOUNTER — Ambulatory Visit: Payer: Medicare Other | Attending: Cardiology

## 2022-05-21 DIAGNOSIS — I4891 Unspecified atrial fibrillation: Secondary | ICD-10-CM

## 2022-05-24 DIAGNOSIS — J9601 Acute respiratory failure with hypoxia: Secondary | ICD-10-CM | POA: Diagnosis not present

## 2022-05-24 DIAGNOSIS — G9341 Metabolic encephalopathy: Secondary | ICD-10-CM | POA: Diagnosis not present

## 2022-05-24 DIAGNOSIS — I480 Paroxysmal atrial fibrillation: Secondary | ICD-10-CM | POA: Diagnosis not present

## 2022-05-24 DIAGNOSIS — G4733 Obstructive sleep apnea (adult) (pediatric): Secondary | ICD-10-CM | POA: Diagnosis not present

## 2022-05-24 DIAGNOSIS — R41841 Cognitive communication deficit: Secondary | ICD-10-CM | POA: Diagnosis not present

## 2022-05-24 DIAGNOSIS — J9602 Acute respiratory failure with hypercapnia: Secondary | ICD-10-CM | POA: Diagnosis not present

## 2022-05-28 DIAGNOSIS — I480 Paroxysmal atrial fibrillation: Secondary | ICD-10-CM | POA: Diagnosis not present

## 2022-05-28 DIAGNOSIS — G9341 Metabolic encephalopathy: Secondary | ICD-10-CM | POA: Diagnosis not present

## 2022-05-28 DIAGNOSIS — J9602 Acute respiratory failure with hypercapnia: Secondary | ICD-10-CM | POA: Diagnosis not present

## 2022-05-28 DIAGNOSIS — G4733 Obstructive sleep apnea (adult) (pediatric): Secondary | ICD-10-CM | POA: Diagnosis not present

## 2022-05-28 DIAGNOSIS — R41841 Cognitive communication deficit: Secondary | ICD-10-CM | POA: Diagnosis not present

## 2022-05-28 DIAGNOSIS — J9601 Acute respiratory failure with hypoxia: Secondary | ICD-10-CM | POA: Diagnosis not present

## 2022-05-29 DIAGNOSIS — G9341 Metabolic encephalopathy: Secondary | ICD-10-CM | POA: Diagnosis not present

## 2022-05-29 DIAGNOSIS — J9602 Acute respiratory failure with hypercapnia: Secondary | ICD-10-CM | POA: Diagnosis not present

## 2022-05-29 DIAGNOSIS — J9601 Acute respiratory failure with hypoxia: Secondary | ICD-10-CM | POA: Diagnosis not present

## 2022-05-29 DIAGNOSIS — G4733 Obstructive sleep apnea (adult) (pediatric): Secondary | ICD-10-CM | POA: Diagnosis not present

## 2022-05-29 DIAGNOSIS — R41841 Cognitive communication deficit: Secondary | ICD-10-CM | POA: Diagnosis not present

## 2022-05-29 DIAGNOSIS — I480 Paroxysmal atrial fibrillation: Secondary | ICD-10-CM | POA: Diagnosis not present

## 2022-05-30 ENCOUNTER — Telehealth: Payer: Self-pay | Admitting: Nurse Practitioner

## 2022-05-30 MED ORDER — CHOLESTYRAMINE 4 G PO PACK: 4.0000 g | PACK | Freq: Three times a day (TID) | ORAL | 12 refills | Status: DC

## 2022-05-30 MED ORDER — COLESTIPOL HCL 5 G PO GRAN: 5.0000 g | GRANULES | Freq: Two times a day (BID) | ORAL | 12 refills | Status: DC

## 2022-05-30 NOTE — Telephone Encounter (Signed)
Laurey Arrow calling to ask for colestipol (COLESTID) 5 g granules to be switched to cholestyramine packets due insurance. Please call back

## 2022-05-30 NOTE — Telephone Encounter (Signed)
Left detailed message on pharmacy voicemail ok to change

## 2022-06-03 DIAGNOSIS — I480 Paroxysmal atrial fibrillation: Secondary | ICD-10-CM | POA: Diagnosis not present

## 2022-06-03 DIAGNOSIS — G4733 Obstructive sleep apnea (adult) (pediatric): Secondary | ICD-10-CM | POA: Diagnosis not present

## 2022-06-03 DIAGNOSIS — J9602 Acute respiratory failure with hypercapnia: Secondary | ICD-10-CM | POA: Diagnosis not present

## 2022-06-03 DIAGNOSIS — G9341 Metabolic encephalopathy: Secondary | ICD-10-CM | POA: Diagnosis not present

## 2022-06-03 DIAGNOSIS — J9601 Acute respiratory failure with hypoxia: Secondary | ICD-10-CM | POA: Diagnosis not present

## 2022-06-03 DIAGNOSIS — R41841 Cognitive communication deficit: Secondary | ICD-10-CM | POA: Diagnosis not present

## 2022-06-05 DIAGNOSIS — F411 Generalized anxiety disorder: Secondary | ICD-10-CM | POA: Diagnosis not present

## 2022-06-05 DIAGNOSIS — J449 Chronic obstructive pulmonary disease, unspecified: Secondary | ICD-10-CM | POA: Diagnosis not present

## 2022-06-05 DIAGNOSIS — M4716 Other spondylosis with myelopathy, lumbar region: Secondary | ICD-10-CM | POA: Diagnosis not present

## 2022-06-05 DIAGNOSIS — N189 Chronic kidney disease, unspecified: Secondary | ICD-10-CM | POA: Diagnosis not present

## 2022-06-05 DIAGNOSIS — K219 Gastro-esophageal reflux disease without esophagitis: Secondary | ICD-10-CM | POA: Diagnosis not present

## 2022-06-05 DIAGNOSIS — R531 Weakness: Secondary | ICD-10-CM | POA: Diagnosis not present

## 2022-06-05 DIAGNOSIS — D631 Anemia in chronic kidney disease: Secondary | ICD-10-CM | POA: Diagnosis not present

## 2022-06-05 DIAGNOSIS — R1312 Dysphagia, oropharyngeal phase: Secondary | ICD-10-CM | POA: Diagnosis not present

## 2022-06-05 DIAGNOSIS — I480 Paroxysmal atrial fibrillation: Secondary | ICD-10-CM | POA: Diagnosis not present

## 2022-06-05 DIAGNOSIS — I129 Hypertensive chronic kidney disease with stage 1 through stage 4 chronic kidney disease, or unspecified chronic kidney disease: Secondary | ICD-10-CM | POA: Diagnosis not present

## 2022-06-05 DIAGNOSIS — I890 Lymphedema, not elsewhere classified: Secondary | ICD-10-CM | POA: Diagnosis not present

## 2022-06-05 DIAGNOSIS — F32A Depression, unspecified: Secondary | ICD-10-CM | POA: Diagnosis not present

## 2022-06-05 DIAGNOSIS — E039 Hypothyroidism, unspecified: Secondary | ICD-10-CM | POA: Diagnosis not present

## 2022-06-05 DIAGNOSIS — G4733 Obstructive sleep apnea (adult) (pediatric): Secondary | ICD-10-CM | POA: Diagnosis not present

## 2022-06-05 DIAGNOSIS — E440 Moderate protein-calorie malnutrition: Secondary | ICD-10-CM | POA: Diagnosis not present

## 2022-06-05 DIAGNOSIS — K580 Irritable bowel syndrome with diarrhea: Secondary | ICD-10-CM | POA: Diagnosis not present

## 2022-06-05 DIAGNOSIS — G9341 Metabolic encephalopathy: Secondary | ICD-10-CM | POA: Diagnosis not present

## 2022-06-05 DIAGNOSIS — R41841 Cognitive communication deficit: Secondary | ICD-10-CM | POA: Diagnosis not present

## 2022-06-05 DIAGNOSIS — M810 Age-related osteoporosis without current pathological fracture: Secondary | ICD-10-CM | POA: Diagnosis not present

## 2022-06-05 DIAGNOSIS — I700 Atherosclerosis of aorta: Secondary | ICD-10-CM | POA: Diagnosis not present

## 2022-06-05 DIAGNOSIS — J9601 Acute respiratory failure with hypoxia: Secondary | ICD-10-CM | POA: Diagnosis not present

## 2022-06-05 DIAGNOSIS — J9602 Acute respiratory failure with hypercapnia: Secondary | ICD-10-CM | POA: Diagnosis not present

## 2022-06-05 DIAGNOSIS — E785 Hyperlipidemia, unspecified: Secondary | ICD-10-CM | POA: Diagnosis not present

## 2022-06-05 DIAGNOSIS — K8010 Calculus of gallbladder with chronic cholecystitis without obstruction: Secondary | ICD-10-CM | POA: Diagnosis not present

## 2022-06-05 DIAGNOSIS — M170 Bilateral primary osteoarthritis of knee: Secondary | ICD-10-CM | POA: Diagnosis not present

## 2022-06-18 DIAGNOSIS — Z800 Family history of malignant neoplasm of digestive organs: Secondary | ICD-10-CM | POA: Diagnosis not present

## 2022-06-18 DIAGNOSIS — R1310 Dysphagia, unspecified: Secondary | ICD-10-CM | POA: Diagnosis not present

## 2022-06-18 DIAGNOSIS — K9089 Other intestinal malabsorption: Secondary | ICD-10-CM | POA: Diagnosis not present

## 2022-06-18 DIAGNOSIS — K219 Gastro-esophageal reflux disease without esophagitis: Secondary | ICD-10-CM | POA: Diagnosis not present

## 2022-06-19 ENCOUNTER — Other Ambulatory Visit: Payer: Self-pay | Admitting: Nurse Practitioner

## 2022-06-19 ENCOUNTER — Other Ambulatory Visit: Payer: Self-pay | Admitting: Gastroenterology

## 2022-06-19 DIAGNOSIS — R1310 Dysphagia, unspecified: Secondary | ICD-10-CM

## 2022-06-19 MED ORDER — DICYCLOMINE HCL 10 MG PO CAPS: ORAL_CAPSULE | ORAL | 1 refills | Status: DC

## 2022-06-24 ENCOUNTER — Ambulatory Visit: Payer: Medicare Other | Attending: Cardiology | Admitting: Cardiology

## 2022-06-24 ENCOUNTER — Encounter: Payer: Self-pay | Admitting: Cardiology

## 2022-06-24 VITALS — BP 115/66 | HR 57 | Ht 62.0 in | Wt 136.0 lb

## 2022-06-24 DIAGNOSIS — I4891 Unspecified atrial fibrillation: Secondary | ICD-10-CM

## 2022-06-24 DIAGNOSIS — R600 Localized edema: Secondary | ICD-10-CM | POA: Diagnosis not present

## 2022-06-24 DIAGNOSIS — I10 Essential (primary) hypertension: Secondary | ICD-10-CM | POA: Diagnosis not present

## 2022-06-24 DIAGNOSIS — R001 Bradycardia, unspecified: Secondary | ICD-10-CM | POA: Diagnosis not present

## 2022-06-24 NOTE — Patient Instructions (Signed)
Medication Instructions:  Continue all current medications.  Labwork: none  Testing/Procedures: none  Follow-Up: 6 months   Any Other Special Instructions Will Be Listed Below (If Applicable).  If you need a refill on your cardiac medications before your next appointment, please call your pharmacy.  

## 2022-06-24 NOTE — Progress Notes (Signed)
Clinical Summary [name] is a 77 y.o.female seen today for follow up of the following medical problems.    1. Leg edema/Chronic diastolic HF   -12/4164 LE venous US [place]: no DVT     09/2017 echo LVEF 60-65%, no WMAs, Diastolic function is abnormal, indeterminant grade   - was seen in lymphedema clinic, started on home pump - taking lasix '40mg'$  daily - she is down roughly 50 lbs since 2019 due to dietary changes     -low bp's recently and declinring renal function, we have scaled back her diuretic to lasix 20 mg M,W,F.  - 6/6 Cr 1.6, had been 0.9 in April - dizzienss is imprving. Edema has increased, weight up from 159 lbs to 173 lbs   - uses leg pumps for lymphedema. Does report lymphedema is progression - she is on lasix '20mg'$  3 days a weeks - home weights 149 lbs.    Swelling is improving.  -using lymphedema pumps at home - taking lasix 3 times a week.      2.HTN - compliant with meds   3. Bradycardia - ER visit 03/2022 [facility] with sinus bradycardia. HR 38 noted by home health nurse, asymptomatic but sent to ER - had been on amio, was held at discharge - continues to deny sym,ptoms   4.Hypokalemia - K 1.7 on ER visit. From notes GI losses with frequent loose stools   5.Afib - new diagnosis 12/2021 during admission with septic shock on pressors, vent. Also SBO that admission - started on amio at that time.  - pcp just restarted synthroid, TSH was elevated.  - appears eliquis was stopped due to falls.     - 30 day monitor without recurrent afib  Past Medical History:  Diagnosis Date   Allergy    Bronchitis, chronic (HCC)    Chronic anxiety    Complication of anesthesia    hard to wake up   COPD (chronic obstructive pulmonary disease) (HCC)    Depression    Esophageal reflux    Headache(784.0)    Hyperlipidemia    Hypertension    Hypothyroidism    Obesity    Osteoporosis    Overactive bladder    PVC's (premature ventricular contractions)     Thyroid disease    Vitamin D deficiency disease      Allergies  Allergen Reactions   Celebrex [Celecoxib] Swelling   Keflet [Cephalexin] Swelling   Penicillins Hives and Swelling    DID THE REACTION INVOLVE: Swelling of the face/tongue/throat, SOB, or low BP? Yes-swelling-hives Sudden or severe rash/hives, skin peeling, or the inside of the mouth or nose? Unknown Did it require medical treatment? Unknown When did it last happen?    over 10 years   If all above answers are "NO", may proceed with cephalosporin use.    Sulfa Antibiotics Swelling   Kenalog [Triamcinolone Acetonide]     unknown   Latex    Lisinopril Other (See Comments)    unknown   Mobic [Meloxicam]     SWELLING   Penicillin G Other (See Comments)     Current Outpatient Medications  Medication Sig Dispense Refill   amLODipine (NORVASC) 5 MG tablet Take 1 tablet (5 mg total) by mouth daily. 90 tablet 1   cholestyramine (QUESTRAN) 4 g packet Take 1 packet (4 g total) by mouth 3 (three) times daily with meals. 60 each 12   citalopram (CELEXA) 40 MG tablet Take 1 tablet (40 mg total)  by mouth daily. 90 tablet 1   colestipol (COLESTID) 5 g granules Take 5 g by mouth 2 (two) times daily. 500 g 12   dicyclomine (BENTYL) 10 MG capsule TAKE ONE CAPSULE BY MOUTH THREE TIMES DAILY BEFORE meals 270 capsule 1   furosemide (LASIX) 20 MG tablet Take 1 tablet (20 mg total) by mouth 3 (three) times a week. 90 tablet 1   levothyroxine (SYNTHROID) 75 MCG tablet Take 1 tablet (75 mcg total) by mouth daily. 90 tablet 1   loperamide (IMODIUM) 2 MG capsule Take 1 capsule (2 mg total) by mouth as needed for diarrhea or loose stools.     potassium chloride (KLOR-CON M) 10 MEQ tablet Take 1 tablet (10 mEq total) by mouth 2 (two) times daily. 90 tablet 1   rOPINIRole (REQUIP) 3 MG tablet Take 1 tablet (3 mg total) by mouth at bedtime. 90 tablet 1   No current facility-administered medications for this visit.     Past Surgical  History:  Procedure Laterality Date   ABDOMINAL HYSTERECTOMY     CENTRAL LINE INSERTION Right 01/10/2022   Procedure: CENTRAL LINE INSERTION;  Surgeon: Virl Cagey, MD;  Location: [facility];  Service: General;  Laterality: Right;   CHOLECYSTECTOMY N/A 11/04/2013   Procedure: LAPAROSCOPIC CHOLECYSTECTOMY WITH INTRAOPERATIVE CHOLANGIOGRAM;  Surgeon: Imogene Burn. Georgette Dover, MD;  Location: [facility];  Service: General;  Laterality: N/A;   COLONOSCOPY     FRACTURE SURGERY     pins removed from Hip surgery   HIP FRACTURE SURGERY  1990    pins removed in [place] CV LINE LEFT  01/31/2022   IR US GUIDE VASC ACCESS LEFT  01/31/2022   LAPAROTOMY N/A 01/10/2022   Procedure: EXPLORATORY LAPAROTOMY,;  Surgeon: Virl Cagey, MD;  Location: [facility];  Service: General;  Laterality: N/A;   LYSIS OF ADHESION N/A 01/10/2022   Procedure: LYSIS OF ADHESION;  Surgeon: Virl Cagey, MD;  Location: [facility];  Service: General;  Laterality: N/A;   [place] N/A 01/18/2022   Procedure: THORACENTESIS;  Surgeon: Candee Furbish, MD;  Location: [facility]COPY;  Service: Pulmonary;  Laterality: N/A;   UPPER GASTROINTESTINAL ENDOSCOPY       Allergies  Allergen Reactions   Celebrex [Celecoxib] Swelling   Keflet [Cephalexin] Swelling   Penicillins Hives and Swelling    DID THE REACTION INVOLVE: Swelling of the face/tongue/throat, SOB, or low BP? Yes-swelling-hives Sudden or severe rash/hives, skin peeling, or the inside of the mouth or nose? Unknown Did it require medical treatment? Unknown When did it last happen?    over 10 years   If all above answers are "NO", may proceed with cephalosporin use.    Sulfa Antibiotics Swelling   Kenalog [Triamcinolone Acetonide]     unknown   Latex    Lisinopril Other (See Comments)    unknown   Mobic [Meloxicam]     SWELLING   Penicillin G Other (See Comments)      Family History  Problem Relation Age of  Onset   Arthritis Other    Heart disease Other    Cancer Other    Diabetes Other    OCD Other      Social History [name] reports that she has never smoked. She has never used smokeless tobacco. [name] reports no history of alcohol use.   Review of Systems CONSTITUTIONAL: No weight loss, fever,  chills, weakness or fatigue.  HEENT: Eyes: No visual loss, blurred vision, double vision or yellow sclerae.No hearing loss, sneezing, congestion, runny nose or sore throat.  SKIN: No rash or itching.  CARDIOVASCULAR: per hpi RESPIRATORY: No shortness of breath, cough or sputum.  GASTROINTESTINAL: No anorexia, nausea, vomiting or diarrhea. No abdominal pain or blood.  GENITOURINARY: No burning on urination, no polyuria NEUROLOGICAL: No headache, dizziness, syncope, paralysis, ataxia, numbness or tingling in the extremities. No change in bowel or bladder control.  MUSCULOSKELETAL: No muscle, back pain, joint pain or stiffness.  LYMPHATICS: No enlarged nodes. No history of splenectomy.  PSYCHIATRIC: No history of depression or anxiety.  ENDOCRINOLOGIC: No reports of sweating, cold or heat intolerance. No polyuria or polydipsia.  Marland Kitchen   Physical Examination Today's Vitals   06/24/22 1312  BP: 115/66  Pulse: (!) 57  SpO2: 99%  Weight: 136 lb (61.7 kg)  Height: '5\' 2"'$  (1.575 m)   Body mass index is 24.87 kg/m.  Gen: resting comfortably, no acute distress HEENT: no scleral icterus, pupils equal round and reactive, no palptable cervical adenopathy,  CV: RRR, no m/rg, no jvd Resp: Clear to auscultation bilaterally GI: abdomen is soft, non-tender, non-distended, normal bowel sounds, no hepatosplenomegaly MSK: extremities are warm, no edema.  Skin: warm, no rash Neuro:  no focal deficits Psych: appropriate affect   Diagnostic Studies  09/2017 echo Study Conclusions   - Left ventricle: The cavity size was normal. Wall thickness was   increased in a pattern of mild LVH. Systolic  function was normal.   The estimated ejection fraction was in the range of 60% to 65%.   Wall motion was normal; there were no regional wall motion   abnormalities. - Aortic valve: Mildly calcified annulus. Trileaflet; mildly   thickened leaflets. Mean gradient (S): 8 mm Hg. Valve area (VTI):   2.13 cm^2. Valve area (Vmax): 1.86 cm^2. Valve area (Vmean): 2.05   cm^2. - Left atrium: The atrium was severely dilated. - Right atrium: The atrium was mildly dilated. - Technically adequate study.   12/2021 echo IMPRESSIONS     1. Left ventricular ejection fraction, by estimation, is 60 to 65%. The  left ventricle has normal function. The left ventricle has no regional  wall motion abnormalities. Left ventricular diastolic parameters were  normal.   2. Right ventricular systolic function is normal. The right ventricular  size is normal. There is normal pulmonary artery systolic pressure.   3. Left atrial size was mildly dilated.   4. Right atrial size was mildly dilated.   5. Calcified subchordal apparatus. The mitral valve is abnormal. Trivial  mitral valve regurgitation. No evidence of mitral stenosis.   6. The aortic valve is tricuspid. There is mild calcification of the  aortic valve. Aortic valve regurgitation is not visualized. Aortic valve  sclerosis is present, with no evidence of aortic valve stenosis.   7. The inferior vena cava is normal in size with greater than 50%  respiratory variability, suggesting right atrial pressure of 3 mmHg.    Assessment and Plan   1. Leg edema/Chronic diastolic HF - supsect combined diastolic HF and lymphedema - careful diuretic dosing given prior AKI with higher doses. Swelling will only respond so much to diuretic as she also has significant lymphedema - overall stable swelling, continue current therapy   2. HTN - at goal, continue current meds   3.Afib - occurred in settnig of septic shock,respiratory failure - had been on amio, held  given recent  bradycardia. Also appears eliquis has been stopped for fall risk, reconsider anticoag pending montior results - no recurrent afib by 30 day monitor,appears to have been limited to admission with septic shock - continue to monitor at this time, no indication to reconsider anticoag at this time.    5. Bradycardia - no symptoms, avg HR on monitor 53.  - continue to monitor       Arnoldo Lenis, M.D.

## 2022-06-25 DIAGNOSIS — J9601 Acute respiratory failure with hypoxia: Secondary | ICD-10-CM | POA: Diagnosis not present

## 2022-06-25 DIAGNOSIS — G9341 Metabolic encephalopathy: Secondary | ICD-10-CM | POA: Diagnosis not present

## 2022-06-25 DIAGNOSIS — I480 Paroxysmal atrial fibrillation: Secondary | ICD-10-CM | POA: Diagnosis not present

## 2022-06-25 DIAGNOSIS — G4733 Obstructive sleep apnea (adult) (pediatric): Secondary | ICD-10-CM | POA: Diagnosis not present

## 2022-06-25 DIAGNOSIS — J9602 Acute respiratory failure with hypercapnia: Secondary | ICD-10-CM | POA: Diagnosis not present

## 2022-06-25 DIAGNOSIS — J449 Chronic obstructive pulmonary disease, unspecified: Secondary | ICD-10-CM | POA: Diagnosis not present

## 2022-07-04 ENCOUNTER — Ambulatory Visit (INDEPENDENT_AMBULATORY_CARE_PROVIDER_SITE_OTHER): Payer: Medicare Other

## 2022-07-04 DIAGNOSIS — J9601 Acute respiratory failure with hypoxia: Secondary | ICD-10-CM | POA: Diagnosis not present

## 2022-07-04 DIAGNOSIS — M4716 Other spondylosis with myelopathy, lumbar region: Secondary | ICD-10-CM

## 2022-07-04 DIAGNOSIS — I480 Paroxysmal atrial fibrillation: Secondary | ICD-10-CM | POA: Diagnosis not present

## 2022-07-04 DIAGNOSIS — N189 Chronic kidney disease, unspecified: Secondary | ICD-10-CM | POA: Diagnosis not present

## 2022-07-04 DIAGNOSIS — D631 Anemia in chronic kidney disease: Secondary | ICD-10-CM | POA: Diagnosis not present

## 2022-07-04 DIAGNOSIS — M810 Age-related osteoporosis without current pathological fracture: Secondary | ICD-10-CM

## 2022-07-04 DIAGNOSIS — E440 Moderate protein-calorie malnutrition: Secondary | ICD-10-CM

## 2022-07-04 DIAGNOSIS — G4733 Obstructive sleep apnea (adult) (pediatric): Secondary | ICD-10-CM

## 2022-07-04 DIAGNOSIS — J449 Chronic obstructive pulmonary disease, unspecified: Secondary | ICD-10-CM | POA: Diagnosis not present

## 2022-07-04 DIAGNOSIS — I890 Lymphedema, not elsewhere classified: Secondary | ICD-10-CM

## 2022-07-04 DIAGNOSIS — J9602 Acute respiratory failure with hypercapnia: Secondary | ICD-10-CM

## 2022-07-04 DIAGNOSIS — R41841 Cognitive communication deficit: Secondary | ICD-10-CM | POA: Diagnosis not present

## 2022-07-04 DIAGNOSIS — I129 Hypertensive chronic kidney disease with stage 1 through stage 4 chronic kidney disease, or unspecified chronic kidney disease: Secondary | ICD-10-CM | POA: Diagnosis not present

## 2022-07-04 DIAGNOSIS — G9341 Metabolic encephalopathy: Secondary | ICD-10-CM | POA: Diagnosis not present

## 2022-07-04 DIAGNOSIS — R531 Weakness: Secondary | ICD-10-CM | POA: Diagnosis not present

## 2022-07-05 ENCOUNTER — Other Ambulatory Visit: Payer: Medicare Other

## 2022-07-06 ENCOUNTER — Telehealth: Payer: Medicare Other | Admitting: Nurse Practitioner

## 2022-07-06 ENCOUNTER — Emergency Department (HOSPITAL_COMMUNITY): Payer: Medicare Other

## 2022-07-06 ENCOUNTER — Inpatient Hospital Stay (HOSPITAL_COMMUNITY)
Admission: EM | Admit: 2022-07-06 | Discharge: 2022-07-10 | DRG: 070 | Disposition: A | Discharge status: Skilled Nursing Facility | Payer: Medicare Other | Attending: Internal Medicine | Admitting: Internal Medicine

## 2022-07-06 ENCOUNTER — Encounter (HOSPITAL_COMMUNITY): Payer: Self-pay | Admitting: Internal Medicine

## 2022-07-06 DIAGNOSIS — Z881 Allergy status to other antibiotic agents status: Secondary | ICD-10-CM

## 2022-07-06 DIAGNOSIS — Z880 Allergy status to penicillin: Secondary | ICD-10-CM

## 2022-07-06 DIAGNOSIS — K219 Gastro-esophageal reflux disease without esophagitis: Secondary | ICD-10-CM | POA: Diagnosis present

## 2022-07-06 DIAGNOSIS — I130 Hypertensive heart and chronic kidney disease with heart failure and stage 1 through stage 4 chronic kidney disease, or unspecified chronic kidney disease: Secondary | ICD-10-CM | POA: Diagnosis present

## 2022-07-06 DIAGNOSIS — R609 Edema, unspecified: Secondary | ICD-10-CM | POA: Diagnosis not present

## 2022-07-06 DIAGNOSIS — R5381 Other malaise: Secondary | ICD-10-CM | POA: Diagnosis not present

## 2022-07-06 DIAGNOSIS — G9341 Metabolic encephalopathy: Secondary | ICD-10-CM | POA: Diagnosis present

## 2022-07-06 DIAGNOSIS — Z66 Do not resuscitate: Secondary | ICD-10-CM | POA: Diagnosis present

## 2022-07-06 DIAGNOSIS — F411 Generalized anxiety disorder: Secondary | ICD-10-CM | POA: Diagnosis not present

## 2022-07-06 DIAGNOSIS — I5032 Chronic diastolic (congestive) heart failure: Secondary | ICD-10-CM | POA: Diagnosis present

## 2022-07-06 DIAGNOSIS — I340 Nonrheumatic mitral (valve) insufficiency: Secondary | ICD-10-CM | POA: Diagnosis present

## 2022-07-06 DIAGNOSIS — K449 Diaphragmatic hernia without obstruction or gangrene: Secondary | ICD-10-CM | POA: Diagnosis not present

## 2022-07-06 DIAGNOSIS — I959 Hypotension, unspecified: Secondary | ICD-10-CM | POA: Diagnosis not present

## 2022-07-06 DIAGNOSIS — R197 Diarrhea, unspecified: Secondary | ICD-10-CM | POA: Diagnosis not present

## 2022-07-06 DIAGNOSIS — R680 Hypothermia, not associated with low environmental temperature: Secondary | ICD-10-CM | POA: Diagnosis not present

## 2022-07-06 DIAGNOSIS — R531 Weakness: Secondary | ICD-10-CM | POA: Diagnosis not present

## 2022-07-06 DIAGNOSIS — E876 Hypokalemia: Secondary | ICD-10-CM | POA: Diagnosis present

## 2022-07-06 DIAGNOSIS — J449 Chronic obstructive pulmonary disease, unspecified: Secondary | ICD-10-CM | POA: Diagnosis present

## 2022-07-06 DIAGNOSIS — N1832 Chronic kidney disease, stage 3b: Secondary | ICD-10-CM | POA: Diagnosis present

## 2022-07-06 DIAGNOSIS — E785 Hyperlipidemia, unspecified: Secondary | ICD-10-CM | POA: Diagnosis present

## 2022-07-06 DIAGNOSIS — E8809 Other disorders of plasma-protein metabolism, not elsewhere classified: Secondary | ICD-10-CM | POA: Diagnosis present

## 2022-07-06 DIAGNOSIS — I700 Atherosclerosis of aorta: Secondary | ICD-10-CM | POA: Diagnosis not present

## 2022-07-06 DIAGNOSIS — R001 Bradycardia, unspecified: Secondary | ICD-10-CM | POA: Diagnosis present

## 2022-07-06 DIAGNOSIS — E872 Acidosis, unspecified: Secondary | ICD-10-CM | POA: Diagnosis not present

## 2022-07-06 DIAGNOSIS — Z888 Allergy status to other drugs, medicaments and biological substances status: Secondary | ICD-10-CM

## 2022-07-06 DIAGNOSIS — R279 Unspecified lack of coordination: Secondary | ICD-10-CM | POA: Diagnosis not present

## 2022-07-06 DIAGNOSIS — R6510 Systemic inflammatory response syndrome (SIRS) of non-infectious origin without acute organ dysfunction: Secondary | ICD-10-CM | POA: Diagnosis present

## 2022-07-06 DIAGNOSIS — R7401 Elevation of levels of liver transaminase levels: Secondary | ICD-10-CM | POA: Diagnosis not present

## 2022-07-06 DIAGNOSIS — I4891 Unspecified atrial fibrillation: Secondary | ICD-10-CM | POA: Diagnosis present

## 2022-07-06 DIAGNOSIS — F32A Depression, unspecified: Secondary | ICD-10-CM | POA: Diagnosis present

## 2022-07-06 DIAGNOSIS — Z79890 Hormone replacement therapy (postmenopausal): Secondary | ICD-10-CM

## 2022-07-06 DIAGNOSIS — E860 Dehydration: Secondary | ICD-10-CM | POA: Diagnosis present

## 2022-07-06 DIAGNOSIS — R571 Hypovolemic shock: Secondary | ICD-10-CM | POA: Diagnosis present

## 2022-07-06 DIAGNOSIS — M810 Age-related osteoporosis without current pathological fracture: Secondary | ICD-10-CM | POA: Diagnosis not present

## 2022-07-06 DIAGNOSIS — G9340 Encephalopathy, unspecified: Secondary | ICD-10-CM | POA: Diagnosis not present

## 2022-07-06 DIAGNOSIS — Z7401 Bed confinement status: Secondary | ICD-10-CM | POA: Diagnosis not present

## 2022-07-06 DIAGNOSIS — Z882 Allergy status to sulfonamides status: Secondary | ICD-10-CM

## 2022-07-06 DIAGNOSIS — H9190 Unspecified hearing loss, unspecified ear: Secondary | ICD-10-CM | POA: Diagnosis present

## 2022-07-06 DIAGNOSIS — M6282 Rhabdomyolysis: Secondary | ICD-10-CM | POA: Diagnosis present

## 2022-07-06 DIAGNOSIS — M545 Low back pain, unspecified: Secondary | ICD-10-CM

## 2022-07-06 DIAGNOSIS — E039 Hypothyroidism, unspecified: Secondary | ICD-10-CM | POA: Diagnosis present

## 2022-07-06 DIAGNOSIS — Z9049 Acquired absence of other specified parts of digestive tract: Secondary | ICD-10-CM

## 2022-07-06 DIAGNOSIS — R296 Repeated falls: Secondary | ICD-10-CM | POA: Diagnosis present

## 2022-07-06 DIAGNOSIS — R748 Abnormal levels of other serum enzymes: Secondary | ICD-10-CM | POA: Diagnosis not present

## 2022-07-06 DIAGNOSIS — Z79899 Other long term (current) drug therapy: Secondary | ICD-10-CM

## 2022-07-06 DIAGNOSIS — Z981 Arthrodesis status: Secondary | ICD-10-CM

## 2022-07-06 DIAGNOSIS — K529 Noninfective gastroenteritis and colitis, unspecified: Secondary | ICD-10-CM | POA: Diagnosis present

## 2022-07-06 DIAGNOSIS — T68XXXA Hypothermia, initial encounter: Secondary | ICD-10-CM | POA: Diagnosis not present

## 2022-07-06 DIAGNOSIS — R399 Unspecified symptoms and signs involving the genitourinary system: Secondary | ICD-10-CM

## 2022-07-06 DIAGNOSIS — Z90710 Acquired absence of both cervix and uterus: Secondary | ICD-10-CM

## 2022-07-06 DIAGNOSIS — N289 Disorder of kidney and ureter, unspecified: Secondary | ICD-10-CM

## 2022-07-06 DIAGNOSIS — Z91040 Latex allergy status: Secondary | ICD-10-CM

## 2022-07-06 DIAGNOSIS — R4182 Altered mental status, unspecified: Secondary | ICD-10-CM | POA: Diagnosis not present

## 2022-07-06 DIAGNOSIS — R410 Disorientation, unspecified: Secondary | ICD-10-CM

## 2022-07-06 HISTORY — DX: Bradycardia, unspecified: R00.1

## 2022-07-06 LAB — CBC WITH DIFFERENTIAL/PLATELET
Abs Immature Granulocytes: 0.03 10*3/uL (ref 0.00–0.07)
Basophils Absolute: 0 10*3/uL (ref 0.0–0.1)
Basophils Relative: 0 %
Eosinophils Absolute: 0 10*3/uL (ref 0.0–0.5)
Eosinophils Relative: 0 %
HCT: 32.7 % — ABNORMAL LOW (ref 36.0–46.0)
Hemoglobin: 11.2 g/dL — ABNORMAL LOW (ref 12.0–15.0)
Immature Granulocytes: 0 %
Lymphocytes Relative: 6 %
Lymphs Abs: 0.8 10*3/uL (ref 0.7–4.0)
MCH: 29.9 pg (ref 26.0–34.0)
MCHC: 34.3 g/dL (ref 30.0–36.0)
MCV: 87.4 fL (ref 80.0–100.0)
Monocytes Absolute: 0.5 10*3/uL (ref 0.1–1.0)
Monocytes Relative: 4 %
Neutro Abs: 11.3 10*3/uL — ABNORMAL HIGH (ref 1.7–7.7)
Neutrophils Relative %: 90 %
Platelets: 268 10*3/uL (ref 150–400)
RBC: 3.74 MIL/uL — ABNORMAL LOW (ref 3.87–5.11)
RDW: 19.1 % — ABNORMAL HIGH (ref 11.5–15.5)
WBC: 12.6 10*3/uL — ABNORMAL HIGH (ref 4.0–10.5)
nRBC: 0 % (ref 0.0–0.2)

## 2022-07-06 LAB — LACTIC ACID, PLASMA
Lactic Acid, Venous: 1.6 mmol/L (ref 0.5–1.9)
Lactic Acid, Venous: 1.3 mmol/L (ref 0.5–1.9)

## 2022-07-06 LAB — URINALYSIS, W/ REFLEX TO CULTURE (INFECTION SUSPECTED)
Bilirubin Urine: NEGATIVE
Glucose, UA: NEGATIVE mg/dL
Ketones, ur: NEGATIVE mg/dL
Leukocytes,Ua: NEGATIVE
Nitrite: NEGATIVE
Protein, ur: 30 mg/dL — AB
Specific Gravity, Urine: 1.013 (ref 1.005–1.030)
pH: 5 (ref 5.0–8.0)

## 2022-07-06 LAB — BLOOD GAS, ARTERIAL
Acid-base deficit: 11.1 mmol/L — ABNORMAL HIGH (ref 0.0–2.0)
Bicarbonate: 16 mmol/L — ABNORMAL LOW (ref 20.0–28.0)
FIO2: 21 %
O2 Saturation: 99.3 %
Patient temperature: 30.3
pCO2 arterial: 29 mmHg — ABNORMAL LOW (ref 32–48)
pH, Arterial: 7.31 — ABNORMAL LOW (ref 7.35–7.45)
pO2, Arterial: 100 mmHg (ref 83–108)

## 2022-07-06 LAB — COMPREHENSIVE METABOLIC PANEL
ALT: 55 U/L — ABNORMAL HIGH (ref 0–44)
AST: 119 U/L — ABNORMAL HIGH (ref 15–41)
Albumin: 3.1 g/dL — ABNORMAL LOW (ref 3.5–5.0)
Alkaline Phosphatase: 63 U/L (ref 38–126)
Anion gap: 14 (ref 5–15)
BUN: 40 mg/dL — ABNORMAL HIGH (ref 8–23)
CO2: 17 mmol/L — ABNORMAL LOW (ref 22–32)
Calcium: 8.8 mg/dL — ABNORMAL LOW (ref 8.9–10.3)
Chloride: 109 mmol/L (ref 98–111)
Creatinine, Ser: 1.9 mg/dL — ABNORMAL HIGH (ref 0.44–1.00)
GFR, Estimated: 27 mL/min — ABNORMAL LOW (ref >60)
Glucose, Bld: 134 mg/dL — ABNORMAL HIGH (ref 70–99)
Potassium: 2.5 mmol/L — CL (ref 3.5–5.1)
Sodium: 140 mmol/L (ref 135–145)
Total Bilirubin: 1 mg/dL (ref 0.3–1.2)
Total Protein: 5.7 g/dL — ABNORMAL LOW (ref 6.5–8.1)

## 2022-07-06 LAB — BLOOD GAS, VENOUS
Acid-base deficit: 9.1 mmol/L — ABNORMAL HIGH (ref 0.0–2.0)
Bicarbonate: 18.4 mmol/L — ABNORMAL LOW (ref 20.0–28.0)
Drawn by: 51519
FIO2: 21 %
O2 Saturation: 75.6 %
Patient temperature: 30.3
pCO2, Ven: 34 mmHg — ABNORMAL LOW (ref 44–60)
pH, Ven: 7.31 (ref 7.25–7.43)
pO2, Ven: <31 mmHg — CL (ref 32–45)

## 2022-07-06 LAB — TSH: TSH: 1.07 u[IU]/mL (ref 0.350–4.500)

## 2022-07-06 LAB — PROCALCITONIN: Procalcitonin: 0.13 ng/mL

## 2022-07-06 LAB — ACETAMINOPHEN LEVEL: Acetaminophen (Tylenol), Serum: <10 ug/mL — ABNORMAL LOW (ref 10–30)

## 2022-07-06 LAB — CBG MONITORING, ED: Glucose-Capillary: 156 mg/dL — ABNORMAL HIGH (ref 70–99)

## 2022-07-06 LAB — PROTIME-INR
INR: 1.2 (ref 0.8–1.2)
Prothrombin Time: 14.7 seconds (ref 11.4–15.2)

## 2022-07-06 LAB — MAGNESIUM: Magnesium: 2 mg/dL (ref 1.7–2.4)

## 2022-07-06 LAB — BRAIN NATRIURETIC PEPTIDE: B Natriuretic Peptide: 264 pg/mL — ABNORMAL HIGH (ref 0.0–100.0)

## 2022-07-06 LAB — SALICYLATE LEVEL: Salicylate Lvl: <7 mg/dL — ABNORMAL LOW (ref 7.0–30.0)

## 2022-07-06 LAB — APTT: aPTT: 40 seconds — ABNORMAL HIGH (ref 24–36)

## 2022-07-06 LAB — CK: Total CK: 1514 U/L — ABNORMAL HIGH (ref 38–234)

## 2022-07-06 MED ORDER — LACTATED RINGERS IV SOLN: INTRAVENOUS | Status: DC

## 2022-07-06 MED ORDER — LEVOFLOXACIN IN D5W 750 MG/150ML IV SOLN
750.0000 mg | INTRAVENOUS | Status: DC
  Administered 2022-07-06: 750 mg via INTRAVENOUS
  Filled 2022-07-06: qty 150

## 2022-07-06 MED ORDER — LACTATED RINGERS IV BOLUS
1000.0000 mL | Freq: Once | INTRAVENOUS | Status: AC
  Administered 2022-07-06: 1000 mL via INTRAVENOUS

## 2022-07-06 MED ORDER — CHLORHEXIDINE GLUCONATE CLOTH 2 % EX PADS
6.0000 | MEDICATED_PAD | Freq: Every day | CUTANEOUS | Status: DC
  Administered 2022-07-06 – 2022-07-09 (×4): 6 via TOPICAL

## 2022-07-06 MED ORDER — NOREPINEPHRINE 4 MG/250ML-% IV SOLN
0.0000 ug/min | INTRAVENOUS | Status: DC
  Administered 2022-07-06: 2 ug/min via INTRAVENOUS
  Filled 2022-07-06: qty 250

## 2022-07-06 MED ORDER — ACETAMINOPHEN 325 MG PO TABS: 650.0000 mg | ORAL_TABLET | Freq: Four times a day (QID) | ORAL | Status: DC | PRN

## 2022-07-06 MED ORDER — SODIUM CHLORIDE 0.9 % IV BOLUS
1000.0000 mL | Freq: Once | INTRAVENOUS | Status: AC
  Administered 2022-07-06: 1000 mL via INTRAVENOUS

## 2022-07-06 MED ORDER — POTASSIUM CHLORIDE 10 MEQ/100ML IV SOLN
10.0000 meq | Freq: Once | INTRAVENOUS | Status: AC
  Administered 2022-07-06: 10 meq via INTRAVENOUS
  Filled 2022-07-06: qty 100

## 2022-07-06 MED ORDER — MELATONIN 3 MG PO TABS: 3.0000 mg | ORAL_TABLET | Freq: Every evening | ORAL | Status: DC | PRN

## 2022-07-06 MED ORDER — LEVOTHYROXINE SODIUM 75 MCG PO TABS
75.0000 ug | ORAL_TABLET | Freq: Every day | ORAL | Status: DC
  Administered 2022-07-07 – 2022-07-10 (×4): 75 ug via ORAL
  Filled 2022-07-06 (×4): qty 1

## 2022-07-06 MED ORDER — MAGNESIUM SULFATE 2 GM/50ML IV SOLN
2.0000 g | INTRAVENOUS | Status: AC
  Administered 2022-07-06: 2 g via INTRAVENOUS
  Filled 2022-07-06: qty 50

## 2022-07-06 MED ORDER — POTASSIUM CHLORIDE CRYS ER 10 MEQ PO TBCR
10.0000 meq | EXTENDED_RELEASE_TABLET | Freq: Two times a day (BID) | ORAL | Status: DC
  Administered 2022-07-07: 10 meq via ORAL
  Filled 2022-07-06: qty 1

## 2022-07-06 MED ORDER — ACETAMINOPHEN 650 MG RE SUPP: 650.0000 mg | Freq: Four times a day (QID) | RECTAL | Status: DC | PRN

## 2022-07-06 MED ORDER — POTASSIUM CHLORIDE 10 MEQ/100ML IV SOLN
10.0000 meq | INTRAVENOUS | Status: AC
  Administered 2022-07-06 – 2022-07-07 (×4): 10 meq via INTRAVENOUS
  Filled 2022-07-06 (×4): qty 100

## 2022-07-06 MED ORDER — LEVOFLOXACIN IN D5W 500 MG/100ML IV SOLN: 500.0000 mg | INTRAVENOUS | Status: DC

## 2022-07-06 NOTE — ED Notes (Signed)
Talked  with adult social service  paged to [place]

## 2022-07-06 NOTE — ED Notes (Signed)
Pts brother [name] brought home Pts personal belongings prior to Pt transport upstairs to the [employer].

## 2022-07-06 NOTE — H&P (Signed)
History and Physical      [name] [id number] DOB: Dec 23, 1945 DOA: 07/06/2022  PCP: Chevis Pretty, FNP  Patient coming from: home   I have personally briefly reviewed patient's old medical records in [place]  Chief Complaint: Altered mental status  HPI: [name] is a 77 y.o. female with medical history significant for CKD 3B with baseline creatinine 1.7-1.9, essential hypertension, cortical thyroidism, bradycardia, who is admitted to [facility] on 07/06/2022 with acute metabolic encephalopathy after presenting from home to [facility] ED for evaluation of altered mental status.  The patient, who had recently been living with her son, moved in with her daughter 1 week ago.  With the exception of the patient's daughter, the remainder the family had not seen him in the patient over the course of the interval week. However, upon visiting the patient today, she was found to be on the floor at the daughter's home, covered in feces, lethargic, and confused relative to her baseline mental status, prompting EMS to be contacted.  EMS reportedly noted the patient to be hypothermic, with low blood pressure, and simply brought the patient to independent urgency department for further evaluation and management thereof.  Medical history notable for CKD 3B associated baseline creatinine range of 1.7-1.9, with most recent prior serum creatinine to point noted to be 1.65 on 05/06/22.  Additionally, she has a history of sinus bradycardia, for which she follows with cardiology, reportedly having most recently followed up with outpatient cardiology approximately 9 days ago, who noted asymptomatic sinus bradycardia.  She is not currently on any AV nodal blocking agents.  Per chart review, most recent echocardiogram was performed on 01/14/2022, it was notable for LVEF 60 to 65%, no focal motion maladies, normal diastolic parameters, normal right ventricular systolic function, mildly dilated  bilateral atria and trivial mitral regurgitation.  She also has a history of essentially retention, for which she is on amlodipine at home.  Per chart review, baseline hemoglobin range appears to be 10-14, with most recent prior hemoglobin value of 13.9 on 05/06/2022.    ED Course:  Vital signs in the ED were notable for the following: Initial temperature 96.6 F, subsequently increasing to 88.4 with interval Bair hugger; heart rates in the 50s to 70s; initial blood pressure 79/59, subsequently increasing to 91/72 following interval administration of 2 L normal saline; respiratory rate 16-19, oxygen saturation 100% on room air.  Labs were notable for the following: ABG demonstrated 7.3 1/29/100/16.  CMP notable for sodium 140, potassium 2.5, bicarbonate 17, anion gap 14, creatinine 1.90, BUN/creatinine ratio 21, glucose 134, calcium, adjusted for mild hypoalbuminemia noted to be 9.6, abdomen 3.1, alkaline phosphatase 63, total bilirubin 1.0, AST 119 compared to 21 on 05/06/2022, ALT 55 compared to 11 on 05/06/2022.  BNP 264 compared to 161 on December 2019.  CPK 1500.  Initial lactic acid 1.6, with repeat value trending down to 1.3.  CBC notable for white cell count 12,600 with 90% neutrophils, hemoglobin 11.26 with normocytic/normochromic properties, platelet count 268.  INR 1.2, PTT 40.  Urinalysis shows no white blood cells, leukocyte esterase/nitrate negative, moderate hemoglobin with no RBCs, was positive for hyaline casts and also demonstrated 30 protein.  Blood cultures x 2 were collected prior to initiation of IV antibiotics.  Per my interpretation, EKG in ED demonstrated the following: Interpretation of EKG was limited by the presence of artifact, but within these confines, appears to show sinus bradycardia with PVC, heart rate 49, and no evidence of  T wave or ST changes, including no overt ST elevation.  Imaging and additional notable ED work-up: Chest x-ray shows no evidence of acute  cardiopulmonary process, including no evidence of infiltrate, edema, effusion, or pneumothorax.  Noncontrast CT head showed no evidence of acute intracranial process, including no evidence of intracranial hemorrhage or any evidence of acute infarct.  While in the ED, the following were administered: 2 L NS bolus; Levaquin 750 mg IV x 1, 2 g of IV magnesium potassium chloride 10 mill colons IV over 1 hour x 1 dose.  Subsequently, the patient was admitted for further evaluation and management of acute metabolic encephalopathy in the setting of rhabdomyolysis, with evidence of dehydration, mild acute transaminitis and hypokalemia.   ***red    Review of Systems: As per HPI otherwise 10 point review of systems negative.   Past Medical History:  Diagnosis Date   Allergy    Bradycardia    Bronchitis, chronic (HCC)    Chronic anxiety    Complication of anesthesia    hard to wake up   COPD (chronic obstructive pulmonary disease) (HCC)    Depression    Esophageal reflux    Headache(784.0)    Hyperlipidemia    Hypertension    Hypothyroidism    Obesity    Osteoporosis    Overactive bladder    PVC's (premature ventricular contractions)    Thyroid disease    Vitamin D deficiency disease     Past Surgical History:  Procedure Laterality Date   ABDOMINAL HYSTERECTOMY     CENTRAL LINE INSERTION Right 01/10/2022   Procedure: CENTRAL LINE INSERTION;  Surgeon: Virl Cagey, MD;  Location: [facility];  Service: General;  Laterality: Right;   CHOLECYSTECTOMY N/A 11/04/2013   Procedure: LAPAROSCOPIC CHOLECYSTECTOMY WITH INTRAOPERATIVE CHOLANGIOGRAM;  Surgeon: Imogene Burn. Georgette Dover, MD;  Location: [facility];  Service: General;  Laterality: N/A;   COLONOSCOPY     FRACTURE SURGERY     pins removed from Hip surgery   HIP FRACTURE SURGERY  1990    pins removed in [place] CV LINE LEFT  01/31/2022   IR US GUIDE VASC ACCESS LEFT  01/31/2022   LAPAROTOMY N/A 01/10/2022   Procedure: EXPLORATORY  LAPAROTOMY,;  Surgeon: Virl Cagey, MD;  Location: [facility];  Service: General;  Laterality: N/A;   LYSIS OF ADHESION N/A 01/10/2022   Procedure: LYSIS OF ADHESION;  Surgeon: Virl Cagey, MD;  Location: [facility];  Service: General;  Laterality: N/A;   [place] N/A 01/18/2022   Procedure: THORACENTESIS;  Surgeon: Candee Furbish, MD;  Location: [facility];  Service: Pulmonary;  Laterality: N/A;   UPPER GASTROINTESTINAL ENDOSCOPY      Social History:  reports that she has never smoked. She has never used smokeless tobacco. She reports that she does not drink alcohol and does not use drugs.   Allergies  Allergen Reactions   Celebrex [Celecoxib] Swelling   Keflet [Cephalexin] Swelling   Penicillins Hives and Swelling    DID THE REACTION INVOLVE: Swelling of the face/tongue/throat, SOB, or low BP? Yes-swelling-hives Sudden or severe rash/hives, skin peeling, or the inside of the mouth or nose? Unknown Did it require medical treatment? Unknown When did it last happen?    over 10 years   If all above answers are "NO", may proceed with cephalosporin use.    Sulfa Antibiotics Swelling   Kenalog [Triamcinolone Acetonide]  unknown   Latex    Lisinopril Other (See Comments)    unknown   Mobic [Meloxicam]     SWELLING   Penicillin G Other (See Comments)    Family History  Problem Relation Age of Onset   Arthritis Other    Heart disease Other    Cancer Other    Diabetes Other    OCD Other     Family history reviewed and not pertinent    Prior to Admission medications   Medication Sig Start Date End Date Taking? Authorizing Provider  amLODipine (NORVASC) 5 MG tablet Take 1 tablet (5 mg total) by mouth daily. 05/06/22 05/01/23  Hassell Done, Mary-Margaret, FNP  cholestyramine Lucrezia Starch) 4 g packet Take 1 packet (4 g total) by mouth 3 (three) times daily with meals. 05/30/22   Hassell Done, Mary-Margaret, FNP  citalopram (CELEXA) 40  MG tablet Take 1 tablet (40 mg total) by mouth daily. 05/06/22   Hassell Done, Mary-Margaret, FNP  colestipol (COLESTID) 5 g granules Take 5 g by mouth 2 (two) times daily. 05/30/22   Hassell Done, Mary-Margaret, FNP  dicyclomine (BENTYL) 10 MG capsule TAKE ONE CAPSULE BY MOUTH THREE TIMES DAILY BEFORE meals 06/19/22   Hassell Done, Mary-Margaret, FNP  furosemide (LASIX) 20 MG tablet Take 1 tablet (20 mg total) by mouth 3 (three) times a week. 05/06/22   Hassell Done Mary-Margaret, FNP  levothyroxine (SYNTHROID) 75 MCG tablet Take 1 tablet (75 mcg total) by mouth daily. 05/07/22   Hassell Done, Mary-Margaret, FNP  loperamide (IMODIUM) 2 MG capsule Take 1 capsule (2 mg total) by mouth as needed for diarrhea or loose stools. 02/04/22   Patrecia Pour, MD  pantoprazole (PROTONIX) 40 MG tablet Take 40 mg by mouth every morning. 06/18/22   [provider]  potassium chloride (KLOR-CON M) 10 MEQ tablet Take 1 tablet (10 mEq total) by mouth 2 (two) times daily. 05/07/22   Hassell Done, Mary-Margaret, FNP  rOPINIRole (REQUIP) 3 MG tablet Take 1 tablet (3 mg total) by mouth at bedtime. 05/06/22   Chevis Pretty, FNP     Objective    Physical Exam: Vitals:   07/06/22 2005 07/06/22 2037 07/06/22 2100 07/06/22 2200  BP: 91/72  (!) 84/47 (!) 81/49  Pulse: (!) 50 (!) 44 (!) 52 (!) 51  Resp: 16 12 16 16  $ Temp:    (!) 92.9 F (33.8 C)  TempSrc:    Rectal  SpO2: 100% 100% 100% 100%  Weight: 61.7 kg     Height: 5' 2"$  (1.575 m)       General: appears to be stated age; lethargic, confused Skin: warm, dry, no rash Head:  AT/[place] Mouth:  Oral mucosa membranes appear dry, normal dentition Neck: supple; trachea midline Heart:  RRR; did not appreciate any M/R/G Lungs: CTAB, did not appreciate any wheezes, rales, or rhonchi Abdomen: + BS; soft, ND, NT Vascular: 2+ pedal pulses b/l; 2+ radial pulses b/l Extremities: no peripheral edema, no muscle wasting Neuro: In the setting of the patient's current mental status and associated  inability to follow instructions, unable to perform full neurologic exam at this time.  As such, assessment of strength, sensation, and cranial nerves is limited at this time. Patient noted to spontaneously move all 4 extremities. No tremors.      Labs on Admission: I have personally reviewed following labs and imaging studies  CBC: Recent Labs  Lab 07/06/22 1850  WBC 12.6*  NEUTROABS 11.3*  HGB 11.2*  HCT 32.7*  MCV 87.4  PLT 268   Basic  Metabolic Panel: Recent Labs  Lab 07/06/22 1850  NA 140  K 2.5*  CL 109  CO2 17*  GLUCOSE 134*  BUN 40*  CREATININE 1.90*  CALCIUM 8.8*  MG 2.0   GFR: Estimated Creatinine Clearance: 22.1 mL/min (A) (by C-G formula based on SCr of 1.9 mg/dL (H)). Liver Function Tests: Recent Labs  Lab 07/06/22 1850  AST 119*  ALT 55*  ALKPHOS 63  BILITOT 1.0  PROT 5.7*  ALBUMIN 3.1*   No results for input(s): "LIPASE", "AMYLASE" in the last 168 hours. No results for input(s): "AMMONIA" in the last 168 hours. Coagulation Profile: Recent Labs  Lab 07/06/22 1850  INR 1.2   Cardiac Enzymes: Recent Labs  Lab 07/06/22 1850  CKTOTAL 1,514*   BNP (last 3 results) No results for input(s): "PROBNP" in the last 8760 hours. HbA1C: No results for input(s): "HGBA1C" in the last 72 hours. CBG: Recent Labs  Lab 07/06/22 1953  GLUCAP 156*   Lipid Profile: No results for input(s): "CHOL", "HDL", "LDLCALC", "TRIG", "CHOLHDL", "LDLDIRECT" in the last 72 hours. Thyroid Function Tests: No results for input(s): "TSH", "T4TOTAL", "FREET4", "T3FREE", "THYROIDAB" in the last 72 hours. Anemia Panel: No results for input(s): "VITAMINB12", "FOLATE", "FERRITIN", "TIBC", "IRON", "RETICCTPCT" in the last 72 hours. Urine analysis:    Component Value Date/Time   COLORURINE AMBER (A) 07/06/2022 2003   APPEARANCEUR HAZY (A) 07/06/2022 2003   APPEARANCEUR Clear 03/01/2021 1257   LABSPEC 1.013 07/06/2022 2003   PHURINE 5.0 07/06/2022 2003   GLUCOSEU  NEGATIVE 07/06/2022 2003   HGBUR MODERATE (A) 07/06/2022 2003   [place] NEGATIVE 07/06/2022 2003   BILIRUBINUR Negative 03/01/2021 [place] 07/06/2022 2003   PROTEINUR 30 (A) 07/06/2022 2003   NITRITE NEGATIVE 07/06/2022 2003   LEUKOCYTESUR NEGATIVE 07/06/2022 2003    Radiological Exams on Admission: CT Head Wo Contrast  Result Date: 07/06/2022 CLINICAL DATA:  Altered mental status.  No history of trauma. EXAM: CT HEAD WITHOUT CONTRAST TECHNIQUE: Contiguous axial images were obtained from the base of the skull through the vertex without intravenous contrast. RADIATION DOSE REDUCTION: This exam was performed according to the departmental dose-optimization program which includes automated exposure control, adjustment of the mA and/or kV according to patient size and/or use of iterative reconstruction technique. COMPARISON:  Head CT 01/16/2022 FINDINGS: Brain: Again noted are mild cerebral atrophy, small-vessel disease and atrophic ventriculomegaly. There is no midline shift. Cerebellum and brainstem are unremarkable. No new asymmetry is seen concerning for an acute cortical based infarct, hemorrhage or mass. Basal cisterns are clear. Vascular: The carotid siphons are heavily calcified. There are patchy calcifications of the V4 distal vertebral arteries. No hyperdense central vessel. Skull: Negative orbits and orbital contents. There is mild membrane thickening in the ethmoid air cells and left maxillary sinus. Stable 1.9 cm retention cyst in the left maxillary sinus. 1.3 cm retention cyst left frontal sinus, was previously 1.7 cm. Other paranasal sinuses are clear. Nasal septum is midline. Sinuses/Orbits: Mild patchy fluid is again noted in the lower mastoid air cells on the right-greater-than-left, seen previously and unchanged. Both middle ear cavities are clear. A wax impaction again noted in the right external auditory canal. Other: None. IMPRESSION: 1. No acute intracranial CT  findings or interval changes. Chronic change. 2. Sinus membrane disease and chronic small mastoid effusions. 3. Chronic wax impaction in the right external auditory canal. Electronically Signed   By: Telford Nab M.D.   On: 07/06/2022 21:01   DG Chest [facility]  1 View  Result Date: 07/06/2022 CLINICAL DATA:  Questionable sepsis - evaluate for abnormality EXAM: PORTABLE CHEST 1 VIEW COMPARISON:  Chest x-ray 01/22/2022 FINDINGS: The heart and mediastinal contours are within normal limits. Aortic calcification. At least moderate sized hiatal hernia. No focal consolidation. Coarsened interstitial markings with no overt pulmonary edema. Nonspecific blunting of bilateral costophrenic angles. No pleural effusion. No pneumothorax. No acute osseous abnormality. IMPRESSION: 1. No active disease. 2. At least moderate sized hiatal hernia. 3.  Aortic Atherosclerosis (ICD10-I70.0). Electronically Signed   By: Iven Finn M.D.   On: 07/06/2022 19:02      Assessment/Plan   Principal Problem:   Acute metabolic encephalopathy   ***      ***     #) Acute metabolic encephalopathy: Patient presents to the ED for evaluation of lethargy, confusion, after being found down on the floor at her daughter's home for a unclear duration of time.  Appears most consistent with acute metabolic encephalopathy, with multifactorial contributions that include suspected rhabdomyolysis in the setting of elevated CPK and evidence of myoglobinuria on urinalysis, with evidence of resolving metabolic acidosis on CMP as well as presenting ABG, as quantified above, in addition to evidence of dehydration.   Of note, there is no overt evidence of underlying infectious process at this time, including urinalysis that was inconsistent with UTI, while chest x-ray shows no evidence of acute cardiopulmonary process, including no evidence of infiltrate.  However, given the increased risk for a false radiographic negative on chest x-ray in the  setting of presenting dehydration, will also add on procalcitonin level to further assess.  Mild transaminitis noted on presenting labs, although this appears consistent with patient's rhabdomyolysis and relative decline in hepatic perfusion in the context of presenting soft blood pressures.  Overall, will continue infectious workup as outlined below, and continue the IV Levaquin that was initiated in the emergency department today, noting that the patient has documentation of allergies to a number of antibiotic classes.   No overt significant pharmacologic contribution, although will hold home Protonix and Celexa for now. No overt acute focal neurologic deficits to suggest a contribution from an underlying acute CVA. Seizures are also felt to be less likely.    Plan: fall precautions. Repeat CMP/CBC in the AM. Check magnesium level. check TSH.  Hold outpatient Celexa and PPI, as above.  Further evaluation management presenting rhabdomyolysis, as below, including IV fluids as outlined below.  Add on procalcitonin level.  Check ammonia level.  Repeat INR, PTT in the morning.  Check urinary drug screen.  For now, continue Levaquin, monitor for results of blood cultures x 2.               #) Rhabdomyolysis: In the setting of presenting confusion, lethargy, and after being found down on the floor of her daughters for an unclear duration of time, with elevated CPK, and UA suggestive of myoglobinuria.  Mild acute transaminitis also noted, and the patient does not appear to be on a statin at home.  Close monitoring of ensuing electrolytes, including serum potassium, calcium, and phosphorus levels. Without overt injury, compartment syndrome.  Presenting hypokalemia is noted, which could provide exacerbation of underlying rhabdomyolysis via insulin vasoconstriction.  Status post 2 L IV normal saline bolus in the ED.   Plan: LR x 1/3 L bolus at this time, followed by IVF's in the form of LR running at  150 cc/h. Close monitoring of ensuing renal function, including repeat CMP in the morning. Monitor  strict I&O's.  Check phosphorus level.  Repeat CPK in the morning. Tele. Check UDS. Check TSH.  Further evaluation and management of presenting hypokalemia, as further detailed below.                  #) Dehydration: Clinical appearance of such, including the appearance of dry oral mucous membranes as well as laboratory findings notable for acute prerenal azotemia and UA demonstrating  presence of hyaline casts.  Appears to be in the setting of   likely relative decline in oral intake in the setting of unclear duration of downtime on the floor of her daughter's home.    Plan: Monitor strict I's and O's.  Daily weights.  Repeat CMP in the morning. IVF's, as above.Marland Kitchen              #) Acute transaminitis ***            ***            ***             ***           ***           ***   ***  DVT prophylaxis: SCD's ***  Code Status: Full code*** Family Communication: none*** Disposition Plan: Per Rounding Team Consults called: none***;  Admission status: ***     I SPENT GREATER THAN 75 *** MINUTES IN CLINICAL CARE TIME/MEDICAL DECISION-MAKING IN COMPLETING THIS ADMISSION.      [place] DO [facility]  From 7PM - 7AM   07/06/2022, 10:27 PM   ***

## 2022-07-06 NOTE — ED Triage Notes (Signed)
Pt BIB [facility] from home, called out for sick person, upon arrival EMS states home was unkept, lives with daughter who says that pt has not been acting herself since this morning. (Refused to get up, walk, go to bathroom, etc)  Pt had recent surgery, may not have been taking medications X 2 days and not eating. Pt has multiple bruises in various stages on arms, hands, legs.   Hx of anxiety, COPD, heart failure.   Pt c/o dizziness and abd pain since this morning, per daughter.

## 2022-07-06 NOTE — Progress Notes (Signed)
Based on what you shared with me it looks like you have uti symptoms with back pain,that should be evaluated in a face to face office visit. Due to the associating back pain you will need a urinalysis and urine culture for proper treatment. NOTE: There will be NO CHARGE for this eVisit   If you are having a true medical emergency please call 911.      For an urgent face to face visit, [place] has six urgent care centers for your convenience:     [facility] [place] Get Driving Directions [phone] [address] [place], [place] [address]    [facility] ([place]) Get Driving Directions [phone] [address] [place], [place] [address]  [facility] ([place]) Get Driving Directions [phone] [address] [place],  [place]  [address]  [facility] Get Driving Directions [phone] [address], [address] [place], [place] [address]   [facility] [place] Get Driving Directions  [phone] [address] [place], [place] [address]   [facility] [place] Get Driving Directions [phone] [address] [place], [place] [address]  Your MyChart E-visit questionnaire answers were reviewed by a board certified advanced clinical practitioner to complete your personal care plan based on your specific symptoms.  Thank you for using e-Visits.         

## 2022-07-06 NOTE — ED Provider Notes (Signed)
[facility] Provider Note   CSN: [id number] Arrival date & time: 07/06/22  1755     History  Chief Complaint  Patient presents with   Altered Mental Status    hypotension    Sairah Gamez is a 77 y.o. female.   Altered Mental Status  This patient is a 77 year old female, according to the medical history she has a history of hypertension on amlodipine, hypothyroidism on Synthroid, she takes Celexa and a potassium supplement and has acid reflux on pantoprazole.  Unfortunately the patient is not able to give much in the way of information.  Paramedics report that they were called to the house because of "a sick person", when they arrived they found that she was unkept, she had been living with a child who states that she has not been acting like herself this morning, she was refusing to get up and walk, the patient had some type of recent surgery though it is not clear what it was and has not been eating or drinking for the last couple of days and not taking her medications.  They did notice multiple bruises in different stages of healing.  The patient does have a history of COPD and some type of heart failure.  The patient states "I am dizzy" and does not state anything else.  She was found to be severely hypothermic on arrival at 86.6 degrees by rectal route.  Notes from a cardiology visit from January 29 approximately 11 or 12 days ago showed that the patient had some recent renal insufficiency, she had been scaled back on her Lasix to 20 mg Monday Wednesdays and Fridays.  She had a history of intermittent bradycardia and was actually seen at an outside hospital in November with a heart rate in the upper 30s, amiodarone was held from that time.  She also had a history of hypokalemia with a very low potassium of 1.7 because of frequent loose stools.  She has known atrial fibrillation as of August 2023.  She was admitted with septic shock and a  small bowel obstruction at that time.  That was when she was started on amiodarone.  She had been on Eliquis but because of frequent falls this was discontinued.    Home Medications Prior to Admission medications   Medication Sig Start Date End Date Taking? Authorizing Provider  amLODipine (NORVASC) 5 MG tablet Take 1 tablet (5 mg total) by mouth daily. 05/06/22 05/01/23  Hassell Done, Mary-Margaret, FNP  cholestyramine Lucrezia Starch) 4 g packet Take 1 packet (4 g total) by mouth 3 (three) times daily with meals. 05/30/22   Hassell Done, Mary-Margaret, FNP  citalopram (CELEXA) 40 MG tablet Take 1 tablet (40 mg total) by mouth daily. 05/06/22   Hassell Done, Mary-Margaret, FNP  colestipol (COLESTID) 5 g granules Take 5 g by mouth 2 (two) times daily. 05/30/22   Hassell Done, Mary-Margaret, FNP  dicyclomine (BENTYL) 10 MG capsule TAKE ONE CAPSULE BY MOUTH THREE TIMES DAILY BEFORE meals 06/19/22   Hassell Done, Mary-Margaret, FNP  furosemide (LASIX) 20 MG tablet Take 1 tablet (20 mg total) by mouth 3 (three) times a week. 05/06/22   Hassell Done Mary-Margaret, FNP  levothyroxine (SYNTHROID) 75 MCG tablet Take 1 tablet (75 mcg total) by mouth daily. 05/07/22   Hassell Done, Mary-Margaret, FNP  loperamide (IMODIUM) 2 MG capsule Take 1 capsule (2 mg total) by mouth as needed for diarrhea or loose stools. 02/04/22   Patrecia Pour, MD  pantoprazole (PROTONIX) 40 MG tablet Take  40 mg by mouth every morning. 06/18/22   [provider]  potassium chloride (KLOR-CON M) 10 MEQ tablet Take 1 tablet (10 mEq total) by mouth 2 (two) times daily. 05/07/22   Hassell Done, Mary-Margaret, FNP  rOPINIRole (REQUIP) 3 MG tablet Take 1 tablet (3 mg total) by mouth at bedtime. 05/06/22   Chevis Pretty, FNP      Allergies    Celebrex [celecoxib], Keflet [cephalexin], Penicillins, Sulfa antibiotics, Kenalog [triamcinolone acetonide], Latex, Lisinopril, Mobic [meloxicam], and Penicillin g    Review of Systems   Review of Systems  Unable to perform ROS: Mental  status change    Physical Exam Updated Vital Signs BP 91/72   Pulse (!) 44   Temp (!) 88.4 F (31.3 C) (Rectal)   Resp 12   Ht 1.575 m (5' 2"$ )   Wt 61.7 kg   SpO2 100%   BMI 24.87 kg/m  Physical Exam Vitals and nursing note reviewed.  Constitutional:      General: She is in acute distress.     Appearance: She is well-developed. She is ill-appearing.  HENT:     Head: Normocephalic and atraumatic.     Nose: Nose normal.     Mouth/Throat:     Mouth: Mucous membranes are dry.     Pharynx: No oropharyngeal exudate.  Eyes:     General: No scleral icterus.       Right eye: No discharge.        Left eye: No discharge.     Conjunctiva/sclera: Conjunctivae normal.     Pupils: Pupils are equal, round, and reactive to light.  Neck:     Thyroid: No thyromegaly.     Vascular: No JVD.  Cardiovascular:     Rate and Rhythm: Regular rhythm. Bradycardia present.     Heart sounds: Normal heart sounds. No murmur heard.    No friction rub. No gallop.  Pulmonary:     Effort: Pulmonary effort is normal. No respiratory distress.     Breath sounds: Normal breath sounds. No wheezing or rales.  Abdominal:     General: Bowel sounds are normal. There is no distension.     Palpations: Abdomen is soft. There is no mass.     Tenderness: There is no abdominal tenderness.  Musculoskeletal:        General: Swelling and tenderness present. No deformity.     Cervical back: Normal range of motion and neck supple.     Right lower leg: Edema present.     Left lower leg: Edema present.     Comments: Soft obese legs, there is no pitting edema, no peau d'orange, able to move both of her legs but is severely weak in all 4 extremities has a diffuse tremor and is cold to the touch, there is bruising on her bilateral upper extremities and lower extremities as well as over her back in the thoracic and lumbar areas.  Lymphadenopathy:     Cervical: No cervical adenopathy.  Skin:    General: Skin is warm and dry.      Findings: Bruising present. No erythema or rash.  Neurological:     Mental Status: She is alert.     Coordination: Coordination normal.     Comments: The patient is able to answer questions albeit quietly, she does not give detailed answers mostly 1 or 2 words  Psychiatric:        Behavior: Behavior normal.     ED Results / Procedures / Treatments  Labs (all labs ordered are listed, but only abnormal results are displayed) Labs Reviewed  CK - Abnormal; Notable for the following components:      Result Value   Total CK 1,514 (*)    All other components within normal limits  COMPREHENSIVE METABOLIC PANEL - Abnormal; Notable for the following components:   Potassium 2.5 (*)    CO2 17 (*)    Glucose, Bld 134 (*)    BUN 40 (*)    Creatinine, Ser 1.90 (*)    Calcium 8.8 (*)    Total Protein 5.7 (*)    Albumin 3.1 (*)    AST 119 (*)    ALT 55 (*)    GFR, Estimated 27 (*)    All other components within normal limits  CBC WITH DIFFERENTIAL/PLATELET - Abnormal; Notable for the following components:   WBC 12.6 (*)    RBC 3.74 (*)    Hemoglobin 11.2 (*)    HCT 32.7 (*)    RDW 19.1 (*)    Neutro Abs 11.3 (*)    All other components within normal limits  APTT - Abnormal; Notable for the following components:   aPTT 40 (*)    All other components within normal limits  BLOOD GAS, VENOUS - Abnormal; Notable for the following components:   pCO2, Ven 34 (*)    pO2, Ven <31 (*)    Bicarbonate 18.4 (*)    Acid-base deficit 9.1 (*)    All other components within normal limits  BRAIN NATRIURETIC PEPTIDE - Abnormal; Notable for the following components:   B Natriuretic Peptide 264.0 (*)    All other components within normal limits  URINALYSIS, W/ REFLEX TO CULTURE (INFECTION SUSPECTED) - Abnormal; Notable for the following components:   Color, Urine AMBER (*)    APPearance HAZY (*)    Hgb urine dipstick MODERATE (*)    Protein, ur 30 (*)    Bacteria, UA MANY (*)    All other  components within normal limits  BLOOD GAS, ARTERIAL - Abnormal; Notable for the following components:   pH, Arterial 7.31 (*)    pCO2 arterial 29 (*)    Bicarbonate 16.0 (*)    Acid-base deficit 11.1 (*)    All other components within normal limits  CBG MONITORING, ED - Abnormal; Notable for the following components:   Glucose-Capillary 156 (*)    All other components within normal limits  CULTURE, BLOOD (ROUTINE X 2)  CULTURE, BLOOD (ROUTINE X 2)  C DIFFICILE QUICK SCREEN W PCR REFLEX    LACTIC ACID, PLASMA  LACTIC ACID, PLASMA  PROTIME-INR    EKG EKG Interpretation  Date/Time:  Saturday July 06 2022 18:20:09 EST Ventricular Rate:  109 PR Interval:    QRS Duration: 180 QT Interval:  441 QTC Calculation: 531 R Axis:   78 Text Interpretation: Atrial fibrillation Nonspecific intraventricular conduction delay Borderline abnrm T, anterolateral leads Artifact in lead(s) I II III aVR aVL aVF V1 Since last tracing rate slower Confirmed by Noemi Chapel [phone]) on 07/06/2022 6:33:28 PM  Radiology CT Head Wo Contrast  Result Date: 07/06/2022 CLINICAL DATA:  Altered mental status.  No history of trauma. EXAM: CT HEAD WITHOUT CONTRAST TECHNIQUE: Contiguous axial images were obtained from the base of the skull through the vertex without intravenous contrast. RADIATION DOSE REDUCTION: This exam was performed according to the departmental dose-optimization program which includes automated exposure control, adjustment of the mA and/or kV according to patient size and/or use of iterative  reconstruction technique. COMPARISON:  Head CT 01/16/2022 FINDINGS: Brain: Again noted are mild cerebral atrophy, small-vessel disease and atrophic ventriculomegaly. There is no midline shift. Cerebellum and brainstem are unremarkable. No new asymmetry is seen concerning for an acute cortical based infarct, hemorrhage or mass. Basal cisterns are clear. Vascular: The carotid siphons are heavily calcified. There  are patchy calcifications of the V4 distal vertebral arteries. No hyperdense central vessel. Skull: Negative orbits and orbital contents. There is mild membrane thickening in the ethmoid air cells and left maxillary sinus. Stable 1.9 cm retention cyst in the left maxillary sinus. 1.3 cm retention cyst left frontal sinus, was previously 1.7 cm. Other paranasal sinuses are clear. Nasal septum is midline. Sinuses/Orbits: Mild patchy fluid is again noted in the lower mastoid air cells on the right-greater-than-left, seen previously and unchanged. Both middle ear cavities are clear. A wax impaction again noted in the right external auditory canal. Other: None. IMPRESSION: 1. No acute intracranial CT findings or interval changes. Chronic change. 2. Sinus membrane disease and chronic small mastoid effusions. 3. Chronic wax impaction in the right external auditory canal. Electronically Signed   By: Telford Nab M.D.   On: 07/06/2022 21:01   DG Chest Port 1 View  Result Date: 07/06/2022 CLINICAL DATA:  Questionable sepsis - evaluate for abnormality EXAM: PORTABLE CHEST 1 VIEW COMPARISON:  Chest x-ray 01/22/2022 FINDINGS: The heart and mediastinal contours are within normal limits. Aortic calcification. At least moderate sized hiatal hernia. No focal consolidation. Coarsened interstitial markings with no overt pulmonary edema. Nonspecific blunting of bilateral costophrenic angles. No pleural effusion. No pneumothorax. No acute osseous abnormality. IMPRESSION: 1. No active disease. 2. At least moderate sized hiatal hernia. 3.  Aortic Atherosclerosis (ICD10-I70.0). Electronically Signed   By: Iven Finn M.D.   On: 07/06/2022 19:02    Procedures .Critical Care  Performed by: Noemi Chapel, MD Authorized by: Noemi Chapel, MD   Critical care provider statement:    Critical care time (minutes):  45   Critical care time was exclusive of:  Separately billable procedures and treating other patients   Critical  care was necessary to treat or prevent imminent or life-threatening deterioration of the following conditions:  Renal failure, shock and dehydration   Critical care was time spent personally by me on the following activities:  Development of treatment plan with patient or surrogate, discussions with consultants, evaluation of patient's response to treatment, examination of patient, obtaining history from patient or surrogate, review of old charts, re-evaluation of patient's condition, pulse oximetry, ordering and review of radiographic studies, ordering and review of laboratory studies and ordering and performing treatments and interventions   I assumed direction of critical care for this patient from another provider in my specialty: no     Care discussed with: admitting provider   Comments:           Medications Ordered in ED Medications  magnesium sulfate IVPB 2 g 50 mL (2 g Intravenous New Bag/Given 07/06/22 2104)  potassium chloride 10 mEq in 100 mL IVPB (10 mEq Intravenous New Bag/Given 07/06/22 2103)  levofloxacin (LEVAQUIN) IVPB 500 mg (has no administration in time range)  sodium chloride 0.9 % bolus 1,000 mL (1,000 mLs Intravenous New Bag/Given 07/06/22 1953)  sodium chloride 0.9 % bolus 1,000 mL (1,000 mLs Intravenous New Bag/Given 07/06/22 1953)    ED Course/ Medical Decision Making/ A&P Clinical Course as of 07/06/22 2107  Sat Jul 06, 2022  1912 I discussed the care with the employee  of the Department of Social Services who will pass this information on to adult protective services [BM]    Clinical Course User Index [BM] Noemi Chapel, MD                             Medical Decision Making Amount and/or Complexity of Data Reviewed Labs: ordered. Radiology: ordered. ECG/medicine tests: ordered.  Risk Prescription drug management. Decision regarding hospitalization.   This patient presents to the ED for concern of altered mental status hypotension and bradycardia., this  involves an extensive number of treatment options, and is a complaint that carries with it a high risk of complications and morbidity.  The differential diagnosis includes sepsis, trauma, hypokalemia, hypoglycemia, environmental hypothermia, less likely to be stroke   Co morbidities that complicate the patient evaluation  As above the patient has COPD, some degree of heart failure, atrial fibrillation   Additional history obtained:  Additional history obtained from electronic medical record External records from outside source obtained and reviewed including prior cardiology notes Spoke with Daughter in law - states she was abnormal mental status today.  Has had UTIs in the past.   Currently living with daughter Threasa Beards.  Has been living there for approximately decades.  Was living with other kids where she was well cared for - but moved back to [place] (6 days ago - reportedly daughter has mental health illness).   No recent procedures   Lab Tests:  I Ordered, and personally interpreted labs.  The pertinent results include: Renal insufficiency gradually worsening, hypoalbuminemia, hypokalemia, rhabdomyolysis, lactic acid was normal   Imaging Studies ordered:  I ordered imaging studies including chest x-ray without acute findings, CT scan of the head without acute infiltrates I independently visualized and interpreted imaging which showed no acute findings I agree with the radiologist interpretation   Cardiac Monitoring: / EKG:  The patient was maintained on a cardiac monitor.  I personally viewed and interpreted the cardiac monitored which showed an underlying rhythm of: The patient is bradycardic in the 40s, blood pressure is soft at 90/70, getting better with IV fluids   Consultations Obtained:  I requested consultation with the hospitalist,  and discussed lab and imaging findings as well as pertinent plan - they recommend: Admit to higher level of care   Problem List / ED  Course / Critical interventions / Medication management  Hypothermia, the patient was treated aggressively with a Bair hugger, she gradually improved, rectal temperature slowly rose. Hypotension and dehydration treated with IV fluids, multiple boluses, patient responded and gradually improved. Renal insufficiency also treated with IV fluids, rhabdomyolysis also treated with IV fluids and likely related to immobility Hypokalemia treated with IV magnesium and potassium replacements I have reviewed the patients home medicines and have made adjustments as needed   Social Determinants of Health:  Elderly, critically ill   Test / Admission - Considered:  Admit to higher level of care         Final Clinical Impression(s) / ED Diagnoses Final diagnoses:  Hypothermia, initial encounter  Non-traumatic rhabdomyolysis  Hypokalemia  Hypovolemic shock ([place])  Bradycardia  Renal insufficiency    Rx / DC Orders ED Discharge Orders     None         Noemi Chapel, MD 07/06/22 2107

## 2022-07-07 ENCOUNTER — Inpatient Hospital Stay (HOSPITAL_COMMUNITY): Payer: Medicare Other

## 2022-07-07 ENCOUNTER — Other Ambulatory Visit: Payer: Self-pay

## 2022-07-07 DIAGNOSIS — E860 Dehydration: Secondary | ICD-10-CM | POA: Diagnosis present

## 2022-07-07 DIAGNOSIS — T68XXXA Hypothermia, initial encounter: Secondary | ICD-10-CM

## 2022-07-07 DIAGNOSIS — R7401 Elevation of levels of liver transaminase levels: Secondary | ICD-10-CM | POA: Diagnosis present

## 2022-07-07 DIAGNOSIS — R6510 Systemic inflammatory response syndrome (SIRS) of non-infectious origin without acute organ dysfunction: Secondary | ICD-10-CM | POA: Diagnosis present

## 2022-07-07 DIAGNOSIS — N1832 Chronic kidney disease, stage 3b: Secondary | ICD-10-CM | POA: Diagnosis present

## 2022-07-07 DIAGNOSIS — I959 Hypotension, unspecified: Secondary | ICD-10-CM | POA: Diagnosis present

## 2022-07-07 DIAGNOSIS — E876 Hypokalemia: Secondary | ICD-10-CM | POA: Diagnosis present

## 2022-07-07 DIAGNOSIS — M6282 Rhabdomyolysis: Secondary | ICD-10-CM | POA: Diagnosis present

## 2022-07-07 DIAGNOSIS — G9341 Metabolic encephalopathy: Secondary | ICD-10-CM | POA: Diagnosis not present

## 2022-07-07 LAB — CBC WITH DIFFERENTIAL/PLATELET
Abs Immature Granulocytes: 0.06 10*3/uL (ref 0.00–0.07)
Basophils Absolute: 0 10*3/uL (ref 0.0–0.1)
Basophils Relative: 0 %
Eosinophils Absolute: 0 10*3/uL (ref 0.0–0.5)
Eosinophils Relative: 0 %
HCT: 27.2 % — ABNORMAL LOW (ref 36.0–46.0)
Hemoglobin: 9.3 g/dL — ABNORMAL LOW (ref 12.0–15.0)
Immature Granulocytes: 0 %
Lymphocytes Relative: 10 %
Lymphs Abs: 1.4 10*3/uL (ref 0.7–4.0)
MCH: 30.1 pg (ref 26.0–34.0)
MCHC: 34.2 g/dL (ref 30.0–36.0)
MCV: 88 fL (ref 80.0–100.0)
Monocytes Absolute: 0.6 10*3/uL (ref 0.1–1.0)
Monocytes Relative: 5 %
Neutro Abs: 12.3 10*3/uL — ABNORMAL HIGH (ref 1.7–7.7)
Neutrophils Relative %: 85 %
Platelets: 284 10*3/uL (ref 150–400)
RBC: 3.09 MIL/uL — ABNORMAL LOW (ref 3.87–5.11)
RDW: 19 % — ABNORMAL HIGH (ref 11.5–15.5)
WBC: 14.4 10*3/uL — ABNORMAL HIGH (ref 4.0–10.5)
nRBC: 0 % (ref 0.0–0.2)

## 2022-07-07 LAB — RAPID URINE DRUG SCREEN, HOSP PERFORMED
Amphetamines: NOT DETECTED
Barbiturates: NOT DETECTED
Benzodiazepines: NOT DETECTED
Cocaine: NOT DETECTED
Opiates: NOT DETECTED
Tetrahydrocannabinol: NOT DETECTED

## 2022-07-07 LAB — COMPREHENSIVE METABOLIC PANEL
ALT: 46 U/L — ABNORMAL HIGH (ref 0–44)
AST: 93 U/L — ABNORMAL HIGH (ref 15–41)
Albumin: 2.6 g/dL — ABNORMAL LOW (ref 3.5–5.0)
Alkaline Phosphatase: 52 U/L (ref 38–126)
Anion gap: 11 (ref 5–15)
BUN: 36 mg/dL — ABNORMAL HIGH (ref 8–23)
CO2: 15 mmol/L — ABNORMAL LOW (ref 22–32)
Calcium: 8.1 mg/dL — ABNORMAL LOW (ref 8.9–10.3)
Chloride: 113 mmol/L — ABNORMAL HIGH (ref 98–111)
Creatinine, Ser: 1.79 mg/dL — ABNORMAL HIGH (ref 0.44–1.00)
GFR, Estimated: 29 mL/min — ABNORMAL LOW (ref >60)
Glucose, Bld: 56 mg/dL — ABNORMAL LOW (ref 70–99)
Potassium: 2.5 mmol/L — CL (ref 3.5–5.1)
Sodium: 139 mmol/L (ref 135–145)
Total Bilirubin: 0.9 mg/dL (ref 0.3–1.2)
Total Protein: 4.9 g/dL — ABNORMAL LOW (ref 6.5–8.1)

## 2022-07-07 LAB — PHOSPHORUS: Phosphorus: 3.1 mg/dL (ref 2.5–4.6)

## 2022-07-07 LAB — MAGNESIUM: Magnesium: 2.1 mg/dL (ref 1.7–2.4)

## 2022-07-07 LAB — APTT: aPTT: 42 seconds — ABNORMAL HIGH (ref 24–36)

## 2022-07-07 LAB — MRSA NEXT GEN BY PCR, NASAL: MRSA by PCR Next Gen: NOT DETECTED

## 2022-07-07 LAB — C DIFFICILE QUICK SCREEN W PCR REFLEX
C Diff antigen: NEGATIVE
C Diff interpretation: NOT DETECTED
C Diff toxin: NEGATIVE

## 2022-07-07 LAB — PROTIME-INR
INR: 1.3 — ABNORMAL HIGH (ref 0.8–1.2)
Prothrombin Time: 15.8 seconds — ABNORMAL HIGH (ref 11.4–15.2)

## 2022-07-07 LAB — HEPATITIS PANEL, ACUTE
HCV Ab: NONREACTIVE
Hep A IgM: NONREACTIVE
Hep B C IgM: NONREACTIVE
Hepatitis B Surface Ag: NONREACTIVE

## 2022-07-07 LAB — AMMONIA: Ammonia: 21 umol/L (ref 9–35)

## 2022-07-07 LAB — CK: Total CK: 789 U/L — ABNORMAL HIGH (ref 38–234)

## 2022-07-07 LAB — POTASSIUM: Potassium: 3.4 mmol/L — ABNORMAL LOW (ref 3.5–5.1)

## 2022-07-07 MED ORDER — NOREPINEPHRINE 4 MG/250ML-% IV SOLN
2.0000 ug/min | INTRAVENOUS | Status: DC
  Administered 2022-07-07 (×2): 9 ug/min via INTRAVENOUS
  Administered 2022-07-07: 3 ug/min via INTRAVENOUS
  Filled 2022-07-07 (×2): qty 250

## 2022-07-07 MED ORDER — LACTATED RINGERS IV BOLUS
500.0000 mL | Freq: Once | INTRAVENOUS | Status: AC
  Administered 2022-07-07: 500 mL via INTRAVENOUS

## 2022-07-07 MED ORDER — SODIUM CHLORIDE 0.9 % IV SOLN: 250.0000 mL | INTRAVENOUS | Status: DC

## 2022-07-07 MED ORDER — POTASSIUM CHLORIDE 10 MEQ/100ML IV SOLN
10.0000 meq | INTRAVENOUS | Status: AC
  Administered 2022-07-07 (×4): 10 meq via INTRAVENOUS
  Filled 2022-07-07 (×4): qty 100

## 2022-07-07 MED ORDER — POTASSIUM CHLORIDE CRYS ER 20 MEQ PO TBCR
40.0000 meq | EXTENDED_RELEASE_TABLET | Freq: Two times a day (BID) | ORAL | Status: DC
  Administered 2022-07-07 – 2022-07-10 (×6): 40 meq via ORAL
  Filled 2022-07-07 (×7): qty 2

## 2022-07-07 NOTE — TOC Progression Note (Signed)
Transition of Care [facility]) - Progression Note    Patient Details  Name: [name] MRN: [id number] Date of Birth: 02-Jun-1945  Transition of Care [facility]) CM/SW Contact  Salome Arnt, [place] Phone Number: 07/07/2022, 12:44 PM  Clinical Narrative:  LCSW confirmed with after hours APS that APS report was made yesterday to [place] and they will follow up on case within 72 hours.           Expected Discharge Plan and Services                                               Social Determinants of Health (SDOH) Interventions SDOH Screenings   Food Insecurity: No Food Insecurity (07/07/2022)  Housing: Low Risk  (07/07/2022)  Transportation Needs: No Transportation Needs (07/07/2022)  Utilities: Not At Risk (07/07/2022)  Alcohol Screen: Low Risk  (11/28/2021)  Depression (PHQ2-9): Low Risk  (05/06/2022)  Financial Resource Strain: Low Risk  (11/28/2021)  Physical Activity: Inactive (11/28/2021)  Social Connections: Socially Integrated (11/28/2021)  Stress: Stress Concern Present (11/28/2021)  Tobacco Use: Low Risk  (07/06/2022)    Readmission Risk Interventions     No data to display

## 2022-07-07 NOTE — Progress Notes (Signed)
PROGRESS NOTE    [name]  [id number] DOB: 1946-02-05 DOA: 07/06/2022 PCP: Chevis Pretty, FNP   Brief Narrative:  [name] is a 77 y.o. female with medical history significant for CKD 3B with baseline creatinine 1.7-1.9, essential hypertension, hypothyroidism, bradycardia, who is admitted to [facility] on 07/06/2022 with acute metabolic encephalopathy after presenting from home to [facility] for evaluation of altered mental status.  She was admitted with acute metabolic encephalopathy in the setting of rhabdomyolysis with dehydration and hypokalemia.  She appears to have chronic diarrhea.  She apparently was not being well taken care of at home and APS has been involved to investigate her home situation.  Assessment & Plan:   Principal Problem:   Acute metabolic encephalopathy Active Problems:   Acquired hypothyroidism   Rhabdomyolysis   Dehydration   Transaminitis   Hypotension   SIRS (systemic inflammatory response syndrome) (HCC)   Hypokalemia   Stage 3b chronic kidney disease (CKD) (HCC)   Hypothermia  Assessment and Plan:   Acute metabolic encephalopathy-multifactorial In the setting of hypokalemia with dehydration/hypotension Continue to correct as noted below Ammonia and TSH within normal limits  Hypotension in the setting of essential hypertension Hold home antihypertensives Likely in the setting of dehydration Does not appear to be cardiogenic or related to PE Wean norepinephrine as tolerated with ongoing aggressive IV fluid hydration  Severe hypokalemia Likely in the setting of chronic diarrhea Continue to aggressively replete and monitor closely on telemetry  Rhabdomyolysis without AKI Continue IV fluid CK levels are downtrending Monitor in a.m.  Dehydration Continue aggressive IV fluid replenishment Patient has chronic diarrhea  Acute transaminitis Currently downtrending, continue to monitor Likely due to  rhabdomyolysis Acetaminophen level low Ultrasound abdomen pending  Generalized anxiety disorder Holding home Celexa for now until mentation fully improves  Hypothyroidism TSH within normal limits Continue home Synthroid  CKD stage IIIb Baseline creatinine 1.7-1.9 Continue close monitoring  Unsafe home environment APS investigation TOC notified  DVT prophylaxis: SCDs Code Status: DNR Family Communication: Discussed with son at bedside 2/11 Disposition Plan:  Status is: Inpatient Remains inpatient appropriate because: Continued need for IV fluid and medications  Consultants:  None  Procedures:  None  Antimicrobials:  None   Subjective: Patient seen and evaluated today with no new acute complaints or concerns. No acute concerns or events noted overnight.  She appears to have ongoing confusion as well as low blood pressure readings.  Objective: Vitals:   07/07/22 1130 07/07/22 1133 07/07/22 1145 07/07/22 1200  BP: (!) 94/45  (!) 86/43 (!) 93/43  Pulse: (!) 52 (!) 51 (!) 49 (!) 49  Resp: 14 15 13 14  $ Temp:  97.7 F (36.5 C)    TempSrc:  Oral    SpO2: 100% 99% 100% 99%  Weight:      Height:        Intake/Output Summary (Last 24 hours) at 07/07/2022 1235 Last data filed at 07/07/2022 1125 Gross per 24 hour  Intake 5829.16 ml  Output 350 ml  Net 5479.16 ml   Filed Weights   07/06/22 2005 07/06/22 2325 07/07/22 0500  Weight: 61.7 kg 60.3 kg 60.3 kg    Examination:  General exam: Appears calm and comfortable, confused and hard of hearing Respiratory system: Clear to auscultation. Respiratory effort normal. Cardiovascular system: S1 & S2 heard, RRR.  Gastrointestinal system: Abdomen is soft Central nervous system: Alert and awake Extremities: No edema Skin: No significant lesions noted Psychiatry: Flat affect.    Data  Reviewed: I have personally reviewed following labs and imaging studies  CBC: Recent Labs  Lab 07/06/22 1850 07/07/22 0359  WBC  12.6* 14.4*  NEUTROABS 11.3* 12.3*  HGB 11.2* 9.3*  HCT 32.7* 27.2*  MCV 87.4 88.0  PLT 268 [id number]   Basic Metabolic Panel: Recent Labs  Lab 07/06/22 1850 07/07/22 0359  NA 140 139  K 2.5* 2.5*  CL 109 113*  CO2 17* 15*  GLUCOSE 134* 56*  BUN 40* 36*  CREATININE 1.90* 1.79*  CALCIUM 8.8* 8.1*  MG 2.0 2.1  PHOS  --  3.1   GFR: Estimated Creatinine Clearance: 23.2 mL/min (A) (by C-G formula based on SCr of 1.79 mg/dL (H)). Liver Function Tests: Recent Labs  Lab 07/06/22 1850 07/07/22 0359  AST 119* 93*  ALT 55* 46*  ALKPHOS 63 52  BILITOT 1.0 0.9  PROT 5.7* 4.9*  ALBUMIN 3.1* 2.6*   No results for input(s): "LIPASE", "AMYLASE" in the last 168 hours. Recent Labs  Lab 07/07/22 0359  AMMONIA 21   Coagulation Profile: Recent Labs  Lab 07/06/22 1850 07/07/22 0359  INR 1.2 1.3*   Cardiac Enzymes: Recent Labs  Lab 07/06/22 1850 07/07/22 0359  CKTOTAL 1,514* 789*   BNP (last 3 results) No results for input(s): "PROBNP" in the last 8760 hours. HbA1C: No results for input(s): "HGBA1C" in the last 72 hours. CBG: Recent Labs  Lab 07/06/22 1953  GLUCAP 156*   Lipid Profile: No results for input(s): "CHOL", "HDL", "LDLCALC", "TRIG", "CHOLHDL", "LDLDIRECT" in the last 72 hours. Thyroid Function Tests: Recent Labs    07/06/22 1850  TSH 1.070   Anemia Panel: No results for input(s): "VITAMINB12", "FOLATE", "FERRITIN", "TIBC", "IRON", "RETICCTPCT" in the last 72 hours. Sepsis Labs: Recent Labs  Lab 07/06/22 1850 07/06/22 2019  PROCALCITON  --  0.13  LATICACIDVEN 1.6 1.3    Recent Results (from the past 240 hour(s))  Blood Culture (routine x 2)     Status: None (Preliminary result)   Collection Time: 07/06/22  6:50 PM   Specimen: Right Antecubital; Blood  Result Value Ref Range Status   Specimen Description   Final    RIGHT ANTECUBITAL BOTTLES DRAWN AEROBIC AND ANAEROBIC   Special Requests Blood Culture adequate volume  Final   Culture   Final     NO GROWTH < 12 HOURS Performed at [facility], [address] [place], [place] [address]    Report Status PENDING  Incomplete  Blood Culture (routine x 2)     Status: None (Preliminary result)   Collection Time: 07/06/22  7:13 PM   Specimen: Site Not Specified; Blood  Result Value Ref Range Status   Specimen Description   Final    SITE NOT SPECIFIED BOTTLES DRAWN AEROBIC AND ANAEROBIC   Special Requests Blood Culture adequate volume  Final   Culture   Final    NO GROWTH < 12 HOURS Performed at [facility], [address], [place], [place] [address]    Report Status PENDING  Incomplete         Radiology Studies: CT Head Wo Contrast  Result Date: 07/06/2022 CLINICAL DATA:  Altered mental status.  No history of trauma. EXAM: CT HEAD WITHOUT CONTRAST TECHNIQUE: Contiguous axial images were obtained from the base of the skull through the vertex without intravenous contrast. RADIATION DOSE REDUCTION: This exam was performed according to the departmental dose-optimization program which includes automated exposure control, adjustment of the mA and/or kV according to patient size and/or use of  iterative reconstruction technique. COMPARISON:  Head CT 01/16/2022 FINDINGS: Brain: Again noted are mild cerebral atrophy, small-vessel disease and atrophic ventriculomegaly. There is no midline shift. Cerebellum and brainstem are unremarkable. No new asymmetry is seen concerning for an acute cortical based infarct, hemorrhage or mass. Basal cisterns are clear. Vascular: The carotid siphons are heavily calcified. There are patchy calcifications of the V4 distal vertebral arteries. No hyperdense central vessel. Skull: Negative orbits and orbital contents. There is mild membrane thickening in the ethmoid air cells and left maxillary sinus. Stable 1.9 cm retention cyst in the left maxillary sinus. 1.3 cm retention cyst left frontal sinus, was previously 1.7 cm. Other paranasal sinuses are clear. Nasal septum is  midline. Sinuses/Orbits: Mild patchy fluid is again noted in the lower mastoid air cells on the right-greater-than-left, seen previously and unchanged. Both middle ear cavities are clear. A wax impaction again noted in the right external auditory canal. Other: None. IMPRESSION: 1. No acute intracranial CT findings or interval changes. Chronic change. 2. Sinus membrane disease and chronic small mastoid effusions. 3. Chronic wax impaction in the right external auditory canal. Electronically Signed   By: Telford Nab M.D.   On: 07/06/2022 21:01   DG Chest Port 1 View  Result Date: 07/06/2022 CLINICAL DATA:  Questionable sepsis - evaluate for abnormality EXAM: PORTABLE CHEST 1 VIEW COMPARISON:  Chest x-ray 01/22/2022 FINDINGS: The heart and mediastinal contours are within normal limits. Aortic calcification. At least moderate sized hiatal hernia. No focal consolidation. Coarsened interstitial markings with no overt pulmonary edema. Nonspecific blunting of bilateral costophrenic angles. No pleural effusion. No pneumothorax. No acute osseous abnormality. IMPRESSION: 1. No active disease. 2. At least moderate sized hiatal hernia. 3.  Aortic Atherosclerosis (ICD10-I70.0). Electronically Signed   By: Iven Finn M.D.   On: 07/06/2022 19:02        Scheduled Meds:  Chlorhexidine Gluconate Cloth  6 each Topical Daily   levothyroxine  75 mcg Oral Q0600   potassium chloride  40 mEq Oral BID   Continuous Infusions:  sodium chloride Stopped (07/07/22 0412)   lactated ringers 150 mL/hr at 07/07/22 1125   [START ON 07/08/2022] levofloxacin (LEVAQUIN) IV     norepinephrine (LEVOPHED) Adult infusion 5 mcg/min (07/07/22 1125)     LOS: 1 day    Time spent: 35 minutes    [name] Darleen Crocker, DO [facility]  If 7PM-7AM, please contact night-coverage [email or web address] 07/07/2022, 12:35 PM

## 2022-07-07 NOTE — Progress Notes (Signed)
eLink Physician-Brief Progress Note Patient Name: [name] DOB: 1945-10-06 MRN: [id number]   Date of Service  07/07/2022  HPI/Events of Note  77 year old female that presents to the emergency department in the setting of CKD stage IIIb with acute rhabdomyolysis, transaminitis, hypotension now requiring norepinephrine.  Received 3.1 L crystalloid infusion with additional 150 cc/h infusing.  Mild nongap metabolic acidosis with uremia.  Unremarkable CT head.  No recent abdominal imaging.  LVEF 60-65% in August 23; sinus tachycardia on EKG  eICU Interventions  For completeness, complete a right upper quadrant ultrasound and repeat hepatitis panel.  Blood cultures already sent pending results  May benefit from additional crystalloid infusion. 500cc bolus at a time.      Intervention Category Evaluation Type: New Patient Evaluation  [name] 07/07/2022, 4:43 AM

## 2022-07-08 ENCOUNTER — Other Ambulatory Visit: Payer: Medicare Other

## 2022-07-08 DIAGNOSIS — G9341 Metabolic encephalopathy: Secondary | ICD-10-CM | POA: Diagnosis not present

## 2022-07-08 LAB — COMPREHENSIVE METABOLIC PANEL
ALT: 41 U/L (ref 0–44)
AST: 63 U/L — ABNORMAL HIGH (ref 15–41)
Albumin: 2.3 g/dL — ABNORMAL LOW (ref 3.5–5.0)
Alkaline Phosphatase: 50 U/L (ref 38–126)
Anion gap: 7 (ref 5–15)
BUN: 26 mg/dL — ABNORMAL HIGH (ref 8–23)
CO2: 16 mmol/L — ABNORMAL LOW (ref 22–32)
Calcium: 8 mg/dL — ABNORMAL LOW (ref 8.9–10.3)
Chloride: 114 mmol/L — ABNORMAL HIGH (ref 98–111)
Creatinine, Ser: 1.62 mg/dL — ABNORMAL HIGH (ref 0.44–1.00)
GFR, Estimated: 33 mL/min — ABNORMAL LOW (ref >60)
Glucose, Bld: 90 mg/dL (ref 70–99)
Potassium: 3.2 mmol/L — ABNORMAL LOW (ref 3.5–5.1)
Sodium: 137 mmol/L (ref 135–145)
Total Bilirubin: 0.8 mg/dL (ref 0.3–1.2)
Total Protein: 4.5 g/dL — ABNORMAL LOW (ref 6.5–8.1)

## 2022-07-08 LAB — CBC
HCT: 26.6 % — ABNORMAL LOW (ref 36.0–46.0)
Hemoglobin: 9.1 g/dL — ABNORMAL LOW (ref 12.0–15.0)
MCH: 30.1 pg (ref 26.0–34.0)
MCHC: 34.2 g/dL (ref 30.0–36.0)
MCV: 88.1 fL (ref 80.0–100.0)
Platelets: 245 10*3/uL (ref 150–400)
RBC: 3.02 MIL/uL — ABNORMAL LOW (ref 3.87–5.11)
RDW: 20.5 % — ABNORMAL HIGH (ref 11.5–15.5)
WBC: 10.5 10*3/uL (ref 4.0–10.5)
nRBC: 0.3 % — ABNORMAL HIGH (ref 0.0–0.2)

## 2022-07-08 LAB — CK: Total CK: 295 U/L — ABNORMAL HIGH (ref 38–234)

## 2022-07-08 MED ORDER — DICYCLOMINE HCL 10 MG PO CAPS
10.0000 mg | ORAL_CAPSULE | Freq: Three times a day (TID) | ORAL | Status: DC
  Administered 2022-07-08 – 2022-07-10 (×8): 10 mg via ORAL
  Filled 2022-07-08 (×8): qty 1

## 2022-07-08 MED ORDER — OCUVITE-LUTEIN PO CAPS
1.0000 | ORAL_CAPSULE | Freq: Every day | ORAL | Status: DC
  Administered 2022-07-08 – 2022-07-10 (×3): 1 via ORAL
  Filled 2022-07-08 (×3): qty 1

## 2022-07-08 MED ORDER — CITALOPRAM HYDROBROMIDE 20 MG PO TABS
40.0000 mg | ORAL_TABLET | Freq: Every day | ORAL | Status: DC
  Administered 2022-07-08 – 2022-07-10 (×3): 40 mg via ORAL
  Filled 2022-07-08 (×3): qty 2

## 2022-07-08 MED ORDER — ENSURE ENLIVE PO LIQD: 237.0000 mL | Freq: Two times a day (BID) | ORAL | Status: DC

## 2022-07-08 MED ORDER — ROPINIROLE HCL 1 MG PO TABS
3.0000 mg | ORAL_TABLET | Freq: Every day | ORAL | Status: DC
  Administered 2022-07-08 – 2022-07-09 (×2): 3 mg via ORAL
  Filled 2022-07-08 (×2): qty 3

## 2022-07-08 MED ORDER — PANTOPRAZOLE SODIUM 40 MG PO TBEC
40.0000 mg | DELAYED_RELEASE_TABLET | Freq: Every morning | ORAL | Status: DC
  Administered 2022-07-08 – 2022-07-10 (×3): 40 mg via ORAL
  Filled 2022-07-08 (×3): qty 1

## 2022-07-08 NOTE — Progress Notes (Signed)
PROGRESS NOTE    Rella Misek  [id number] DOB: 1946/02/15 DOA: 07/06/2022 PCP: Chevis Pretty, FNP   Brief Narrative:  [name] is a 77 y.o. female with medical history significant for CKD 3B with baseline creatinine 1.7-1.9, essential hypertension, hypothyroidism, bradycardia, who is admitted to [facility] on 07/06/2022 with acute metabolic encephalopathy after presenting from home to [facility] for evaluation of altered mental status.  She was admitted with acute metabolic encephalopathy in the setting of rhabdomyolysis with dehydration and hypokalemia.  She appears to have chronic diarrhea.  She apparently was not being well taken care of at home and APS has been involved to investigate her home situation.  Diarrhea appears to be improving as well as blood pressure.  Assessment & Plan:   Principal Problem:   Acute metabolic encephalopathy Active Problems:   Acquired hypothyroidism   Rhabdomyolysis   Dehydration   Transaminitis   Hypotension   SIRS (systemic inflammatory response syndrome) (HCC)   Hypokalemia   Stage 3b chronic kidney disease (CKD) (HCC)   Hypothermia  Assessment and Plan:   Acute metabolic encephalopathy-multifactorial-improving In the setting of hypokalemia with dehydration/hypotension Continue to correct as noted below Ammonia and TSH within normal limits PT recommending SNF  Hypotension in the setting of essential hypertension Hold home antihypertensives Likely in the setting of dehydration Does not appear to be cardiogenic or related to PE Wean norepinephrine as tolerated with ongoing aggressive IV fluid hydration  Severe hypokalemia-improving Likely in the setting of chronic diarrhea Continue to aggressively replete and monitor closely on telemetry  Rhabdomyolysis without AKI-improving Continue IV fluid CK levels are downtrending Monitor in a.m.  Dehydration-improving Continue aggressive IV fluid replenishment Patient has  chronic diarrhea  Acute transaminitis-improving Currently downtrending, continue to monitor Likely due to rhabdomyolysis Acetaminophen level low Ultrasound abdomen pending  Generalized anxiety disorder Holding home Celexa for now until mentation fully improves  Hypothyroidism TSH within normal limits Continue home Synthroid  CKD stage IIIb Baseline creatinine 1.7-1.9 Continue close monitoring  Unsafe home environment APS investigation TOC notified  DVT prophylaxis: SCDs Code Status: DNR Family Communication: Discussed with son at bedside 2/11 Disposition Plan:  Status is: Inpatient Remains inpatient appropriate because: Continued need for IV fluid and medications  Consultants:  None  Procedures:  None  Antimicrobials:  None   Subjective: Patient seen and evaluated today with no new acute complaints or concerns. No acute concerns or events noted overnight.  She appears less confused and diarrhea is improving.  Objective: Vitals:   07/08/22 0930 07/08/22 0945 07/08/22 1000 07/08/22 1015  BP: (!) 96/53 107/65 119/65 114/70  Pulse: (!) 50 (!) 55 (!) 55 (!) 56  Resp: 11 14 15 17  $ Temp:      TempSrc:      SpO2: 99% 98% 98% 97%  Weight:      Height:        Intake/Output Summary (Last 24 hours) at 07/08/2022 1031 Last data filed at 07/08/2022 1030 Gross per 24 hour  Intake 3346.87 ml  Output 625 ml  Net 2721.87 ml   Filed Weights   07/06/22 2325 07/07/22 0500 07/08/22 0600  Weight: 60.3 kg 60.3 kg 60.3 kg    Examination:  General exam: Appears calm and comfortable, confused and hard of hearing Respiratory system: Clear to auscultation. Respiratory effort normal. Cardiovascular system: S1 & S2 heard, RRR.  Gastrointestinal system: Abdomen is soft Central nervous system: Alert and awake Extremities: No edema Skin: No significant lesions noted Psychiatry: Flat affect.  Data Reviewed: I have personally reviewed following labs and imaging  studies  CBC: Recent Labs  Lab 07/06/22 1850 07/07/22 0359 07/08/22 0354  WBC 12.6* 14.4* 10.5  NEUTROABS 11.3* 12.3*  --   HGB 11.2* 9.3* 9.1*  HCT 32.7* 27.2* 26.6*  MCV 87.4 88.0 88.1  PLT 268 284 [id number]   Basic Metabolic Panel: Recent Labs  Lab 07/06/22 1850 07/07/22 0359 07/07/22 1305 07/08/22 0354  NA 140 139  --  137  K 2.5* 2.5* 3.4* 3.2*  CL 109 113*  --  114*  CO2 17* 15*  --  16*  GLUCOSE 134* 56*  --  90  BUN 40* 36*  --  26*  CREATININE 1.90* 1.79*  --  1.62*  CALCIUM 8.8* 8.1*  --  8.0*  MG 2.0 2.1  --   --   PHOS  --  3.1  --   --    GFR: Estimated Creatinine Clearance: 25.7 mL/min (A) (by C-G formula based on SCr of 1.62 mg/dL (H)). Liver Function Tests: Recent Labs  Lab 07/06/22 1850 07/07/22 0359 07/08/22 0354  AST 119* 93* 63*  ALT 55* 46* 41  ALKPHOS 63 52 50  BILITOT 1.0 0.9 0.8  PROT 5.7* 4.9* 4.5*  ALBUMIN 3.1* 2.6* 2.3*   No results for input(s): "LIPASE", "AMYLASE" in the last 168 hours. Recent Labs  Lab 07/07/22 0359  AMMONIA 21   Coagulation Profile: Recent Labs  Lab 07/06/22 1850 07/07/22 0359  INR 1.2 1.3*   Cardiac Enzymes: Recent Labs  Lab 07/06/22 1850 07/07/22 0359 07/08/22 0354  CKTOTAL 1,514* 789* 295*   BNP (last 3 results) No results for input(s): "PROBNP" in the last 8760 hours. HbA1C: No results for input(s): "HGBA1C" in the last 72 hours. CBG: Recent Labs  Lab 07/06/22 1953  GLUCAP 156*   Lipid Profile: No results for input(s): "CHOL", "HDL", "LDLCALC", "TRIG", "CHOLHDL", "LDLDIRECT" in the last 72 hours. Thyroid Function Tests: Recent Labs    07/06/22 1850  TSH 1.070   Anemia Panel: No results for input(s): "VITAMINB12", "FOLATE", "FERRITIN", "TIBC", "IRON", "RETICCTPCT" in the last 72 hours. Sepsis Labs: Recent Labs  Lab 07/06/22 1850 07/06/22 2019  PROCALCITON  --  0.13  LATICACIDVEN 1.6 1.3    Recent Results (from the past 240 hour(s))  C Difficile Quick Screen w PCR reflex      Status: None   Collection Time: 07/06/22  6:41 PM   Specimen: STOOL  Result Value Ref Range Status   C Diff antigen NEGATIVE NEGATIVE Final   C Diff toxin NEGATIVE NEGATIVE Final   C Diff interpretation No C. difficile detected.  Final    Comment: Performed at [facility], [address], [place], [place] [address]  Blood Culture (routine x 2)     Status: None (Preliminary result)   Collection Time: 07/06/22  6:50 PM   Specimen: Right Antecubital; Blood  Result Value Ref Range Status   Specimen Description   Final    RIGHT ANTECUBITAL BOTTLES DRAWN AEROBIC AND ANAEROBIC   Special Requests Blood Culture adequate volume  Final   Culture   Final    NO GROWTH 2 DAYS Performed at [facility], [address], [place], [place] [address]    Report Status PENDING  Incomplete  Blood Culture (routine x 2)     Status: None (Preliminary result)   Collection Time: 07/06/22  7:13 PM   Specimen: Site Not Specified; Blood  Result Value Ref Range Status   Specimen  Description   Final    SITE NOT SPECIFIED BOTTLES DRAWN AEROBIC AND ANAEROBIC   Special Requests Blood Culture adequate volume  Final   Culture   Final    NO GROWTH 2 DAYS Performed at [facility], [address], [place], [place] [address]    Report Status PENDING  Incomplete  MRSA Next Gen by PCR, Nasal     Status: None   Collection Time: 07/06/22  9:53 PM   Specimen: Nasal Mucosa; Nasal Swab  Result Value Ref Range Status   MRSA by PCR Next Gen NOT DETECTED NOT DETECTED Final    Comment: (NOTE) The GeneXpert MRSA Assay (FDA approved for NASAL specimens only), is one component of a comprehensive MRSA colonization surveillance program. It is not intended to diagnose MRSA infection nor to guide or monitor treatment for MRSA infections. Test performance is not FDA approved in patients less than 35 years old. Performed at [facility], [address], [place], [place] [address]          Radiology Studies: US Abdomen Limited  RUQ (LIVER/GB)  Result Date: 07/07/2022 CLINICAL DATA:  Transaminitis. EXAM: ULTRASOUND ABDOMEN LIMITED RIGHT UPPER QUADRANT COMPARISON:  Abdomen and pelvis CT 01/17/2022 FINDINGS: Gallbladder: Surgically absent. Common bile duct: Diameter: 4 mm Liver: Coarsening of hepatic echotexture without focal abnormality evident. Portal vein is patent on color Doppler imaging with normal direction of blood flow towards the liver. Other: None. IMPRESSION: 1. Coarsening of hepatic echotexture without focal abnormality evident. 2. No biliary dilatation. Electronically Signed   By: Misty Stanley M.D.   On: 07/07/2022 13:21   CT Head Wo Contrast  Result Date: 07/06/2022 CLINICAL DATA:  Altered mental status.  No history of trauma. EXAM: CT HEAD WITHOUT CONTRAST TECHNIQUE: Contiguous axial images were obtained from the base of the skull through the vertex without intravenous contrast. RADIATION DOSE REDUCTION: This exam was performed according to the departmental dose-optimization program which includes automated exposure control, adjustment of the mA and/or kV according to patient size and/or use of iterative reconstruction technique. COMPARISON:  Head CT 01/16/2022 FINDINGS: Brain: Again noted are mild cerebral atrophy, small-vessel disease and atrophic ventriculomegaly. There is no midline shift. Cerebellum and brainstem are unremarkable. No new asymmetry is seen concerning for an acute cortical based infarct, hemorrhage or mass. Basal cisterns are clear. Vascular: The carotid siphons are heavily calcified. There are patchy calcifications of the V4 distal vertebral arteries. No hyperdense central vessel. Skull: Negative orbits and orbital contents. There is mild membrane thickening in the ethmoid air cells and left maxillary sinus. Stable 1.9 cm retention cyst in the left maxillary sinus. 1.3 cm retention cyst left frontal sinus, was previously 1.7 cm. Other paranasal sinuses are clear. Nasal septum is midline.  Sinuses/Orbits: Mild patchy fluid is again noted in the lower mastoid air cells on the right-greater-than-left, seen previously and unchanged. Both middle ear cavities are clear. A wax impaction again noted in the right external auditory canal. Other: None. IMPRESSION: 1. No acute intracranial CT findings or interval changes. Chronic change. 2. Sinus membrane disease and chronic small mastoid effusions. 3. Chronic wax impaction in the right external auditory canal. Electronically Signed   By: Telford Nab M.D.   On: 07/06/2022 21:01   DG Chest Port 1 View  Result Date: 07/06/2022 CLINICAL DATA:  Questionable sepsis - evaluate for abnormality EXAM: PORTABLE CHEST 1 VIEW COMPARISON:  Chest x-ray 01/22/2022 FINDINGS: The heart and mediastinal contours are within normal limits. Aortic calcification. At least moderate  sized hiatal hernia. No focal consolidation. Coarsened interstitial markings with no overt pulmonary edema. Nonspecific blunting of bilateral costophrenic angles. No pleural effusion. No pneumothorax. No acute osseous abnormality. IMPRESSION: 1. No active disease. 2. At least moderate sized hiatal hernia. 3.  Aortic Atherosclerosis (ICD10-I70.0). Electronically Signed   By: Iven Finn M.D.   On: 07/06/2022 19:02        Scheduled Meds:  Chlorhexidine Gluconate Cloth  6 each Topical Daily   citalopram  40 mg Oral Daily   dicyclomine  10 mg Oral TID AC & HS   levothyroxine  75 mcg Oral Q0600   pantoprazole  40 mg Oral q morning   potassium chloride  40 mEq Oral BID   rOPINIRole  3 mg Oral QHS   Continuous Infusions:  sodium chloride Stopped (07/07/22 0412)   lactated ringers 75 mL/hr at 07/08/22 1020   norepinephrine (LEVOPHED) Adult infusion 2 mcg/min (07/08/22 0745)     LOS: 2 days    Time spent: 35 minutes    Tekoa Hamor D Manuella Ghazi, DO [facility]  If 7PM-7AM, please contact night-coverage [email or web address] 07/08/2022, 10:31 AM

## 2022-07-08 NOTE — Plan of Care (Signed)
  Problem: Acute Rehab PT Goals(only PT should resolve) Goal: Pt Will Go Supine/Side To Sit Outcome: Progressing Flowsheets (Taken 07/08/2022 1413) Pt will go Supine/Side to Sit: with minimal assist Goal: Patient Will Transfer Sit To/From Stand Outcome: Progressing Flowsheets (Taken 07/08/2022 1413) Patient will transfer sit to/from stand:  with moderate assist  with minimal assist Goal: Pt Will Transfer Bed To Chair/Chair To Bed Outcome: Progressing Flowsheets (Taken 07/08/2022 1413) Pt will Transfer Bed to Chair/Chair to Bed: with mod assist Goal: Pt Will Ambulate Outcome: Progressing Flowsheets (Taken 07/08/2022 1413) Pt will Ambulate:  10 feet  with moderate assist  with rolling walker   2:14 PM, 07/08/22 Lonell Grandchild, MPT Physical Therapist with [facility] [phone] office [phone] mobile phone

## 2022-07-08 NOTE — Progress Notes (Signed)
Initial Nutrition Assessment  DOCUMENTATION CODES:      INTERVENTION:  Ensure Enlive po BID, each supplement provides 350 kcal and 20 grams of protein.   Likes /dislikes obtained  NUTRITION DIAGNOSIS:   Inadequate oral intake related to acute illness as evidenced by per patient/family report.   GOAL:  Patient will meet greater than or equal to 90% of their needs   MONITOR:  PO intake, Supplement acceptance, Labs, Weight trends  REASON FOR ASSESSMENT:   Malnutrition Screening Tool    ASSESSMENT: Patient is a 77 yo female with acute metabolic encephalopathy (hypokalemia, dehydration, hypotension) per MD. Concern for patient care at home. APS contacted  per chart.   PMH: CKD3b, chronic diarrhea.   Patient meal intake 50-75% today. Able to feed herself.   Patient eats regular diet at home. Likes hot or cold cereal for breakfast, Nabs (peanut butter crackers) mid day and usually a microwave type meal in the evening. Prefers unsweet tea to drink. Patient says she used to cook but not much anymore. Reports difficulty chewing. She is agreeable downgrade meats to chopped. She walks with a walker at baseline.    Weight loss noted since November ~ 12 kg (17%) per hospital records. Significant for timeframe. Expect patient is malnourished - unable to complete NFPE today during visit- deferred to follow up.   Medications: Klor-Con, Protonix, synthroid. IVF and Levophed.      Latest Ref Rng & Units 07/08/2022    3:54 AM 07/07/2022    1:05 PM 07/07/2022    3:59 AM  BMP  Glucose 70 - 99 mg/dL 90   56   BUN 8 - 23 mg/dL 26   36   Creatinine 0.44 - 1.00 mg/dL 1.62   1.79   Sodium 135 - 145 mmol/L 137   139   Potassium 3.5 - 5.1 mmol/L 3.2  3.4  2.5   Chloride 98 - 111 mmol/L 114   113   CO2 22 - 32 mmol/L 16   15   Calcium 8.9 - 10.3 mg/dL 8.0   8.1      Diet Order:   Diet Order             Diet regular Room service appropriate? Yes; Fluid consistency: Thin  Diet effective now                    Nutrition Focused Physical Exam:   EDUCATION NEEDS:  Education needs have been addressed  Skin:  Skin Assessment: Reviewed RN Assessment  Last BM:  2/12 small- type 7   Height:   Ht Readings from Last 1 Encounters:  07/06/22 5' 2"$  (1.575 m)    Weight:   Wt Readings from Last 1 Encounters:  07/08/22 60.3 kg    Ideal Body Weight:   50 kg  BMI:  Body mass index is 24.31 kg/m.  Estimated Nutritional Needs:   Kcal:  1600-1800  Protein:  80-88 gr  Fluid:  < 2 liters daily   Colman Cater MS,RD,CSG,LDN Contact:

## 2022-07-08 NOTE — TOC Initial Note (Addendum)
Transition of Care [facility]) - Initial/Assessment Note    Patient Details  Name: [name] MRN: [id number] Date of Birth: 30-Aug-1945  Transition of Care [facility]) CM/SW Contact:    Ihor Gully, LCSW Phone Number: 07/08/2022, 12:49 PM  Clinical Narrative:                 Patient from home with daughter. Uses a walker. Has wheelchair. She and daughter do not drive. Sister and daughter in law take to appointments. PT recommends SNF. Patient is agreeable. Referred to SNFs of choice.  Tyron Russell with [facility] APS confirms that a report was screened in on 2/10. Theadora Rama is the APS Education officer, museum with [facility].  Expected Discharge Plan: Skilled Nursing Facility Barriers to Discharge: Continued Medical Work up   Patient Goals and CMS Choice Patient states their goals for this hospitalization and ongoing recovery are:: Patient wants to go to rehab then home          Expected Discharge Plan and Services       Living arrangements for the past 2 months: Single Family Home                                      Prior Living Arrangements/Services Living arrangements for the past 2 months: Single Family Home Lives with:: Adult Children Patient language and need for interpreter reviewed:: Yes Do you feel safe going back to the place where you live?: Yes      Need for Family Participation in Patient Care: Yes (Comment) Care giver support system in place?: Yes (comment) Current home services: DME Criminal Activity/Legal Involvement Pertinent to Current Situation/Hospitalization: No - Comment as needed  Activities of Daily Living Home Assistive Devices/Equipment: Walker (specify type) ADL Screening (condition at time of admission) Patient's cognitive ability adequate to safely complete daily activities?: Yes Is the patient deaf or have difficulty hearing?: Yes Does the patient have difficulty seeing, even when wearing glasses/contacts?: No Does the patient have difficulty  concentrating, remembering, or making decisions?: Yes Patient able to express need for assistance with ADLs?: Yes Does the patient have difficulty dressing or bathing?: Yes Independently performs ADLs?: No Communication: Needs assistance Is this a change from baseline?: Change from baseline, expected to last <3 days Dressing (OT): Needs assistance Is this a change from baseline?: Change from baseline, expected to last <3days Grooming: Needs assistance Is this a change from baseline?: Change from baseline, expected to last <3 days Feeding: Needs assistance Is this a change from baseline?: Change from baseline, expected to last <3 days Bathing: Needs assistance Is this a change from baseline?: Change from baseline, expected to last <3 days Toileting: Needs assistance Is this a change from baseline?: Change from baseline, expected to last <3 days In/Out Bed: Needs assistance Is this a change from baseline?: Change from baseline, expected to last <3 days Walks in Home: Independent with device (comment) Does the patient have difficulty walking or climbing stairs?: Yes Weakness of Legs: Both Weakness of Arms/Hands: None  Permission Sought/Granted Permission sought to share information with : Other (comment)    Share Information with NAME: Antonietta Breach, [facility] APS           Emotional Assessment     Affect (typically observed): Appropriate Orientation: : Oriented to Self, Oriented to Place, Oriented to Situation Alcohol / Substance Use: Not Applicable Psych Involvement: No (comment)  Admission diagnosis:  Hypovolemic shock (  [place]) [R57.1] Hypokalemia [E87.6] Bradycardia [R00.1] Renal insufficiency [N28.9] Hypothermia, initial encounter [T68.XXXA] Non-traumatic rhabdomyolysis [id number] Acute metabolic encephalopathy [id number] Patient Active Problem List   Diagnosis Date Noted   Rhabdomyolysis 07/07/2022   Dehydration 07/07/2022   Transaminitis 07/07/2022   Hypotension 07/07/2022    SIRS (systemic inflammatory response syndrome) (HCC) 07/07/2022   Hypokalemia 07/07/2022   Stage 3b chronic kidney disease (CKD) ([place]) 07/07/2022   Hypothermia 99991111   Acute metabolic encephalopathy [id number]   BMI 26.0-26.9,adult 05/06/2022   Atrial fibrillation ([place]) 04/12/2022   Congenital heart failure ([place]) 04/12/2022   Malnutrition of moderate degree 01/14/2022   Respiratory failure requiring intubation ([place]) 01/11/2022    Class: Acute   Septic shock ([place]) 01/10/2022   Pressure injury of skin 01/10/2022   Small bowel obstruction ([place]) 01/07/2022   Abnormal weight loss 11/28/2021   Family history of malignant neoplasm of digestive organs 11/28/2021   Intestinal malabsorption 11/28/2021   Renal insufficiency 12/08/2020   Degeneration of lumbar intervertebral disc 01/07/2020   Venous (peripheral) insufficiency 09/30/2019   Anemia 08/12/2017   Vitamin D deficiency 08/12/2017   Lumbar spondylosis with myelopathy 06/03/2017   Primary osteoarthritis of both knees 03/19/2017   Anxiety 02/17/2017   History of pulmonary embolus (PE) 07/09/2016   COPD (chronic obstructive pulmonary disease) ([place]) 01/18/2016   Hyperlipidemia 01/18/2016   Depression 01/18/2016   Essential hypertension 01/18/2016   H/O spinal fusion 01/18/2016   Acquired hypothyroidism 01/18/2016   Thoracic aorta atherosclerosis ([place]) 01/18/2016   Chronic cholecystitis with calculus 10/04/2013   PCP:  Chevis Pretty, FNP Pharmacy:   [facility], [place] [id number] [place] [id number] Phone: [phone] Fax: [phone]     Social Determinants of Health (SDOH) Social History: SDOH Screenings   Food Insecurity: No Food Insecurity (07/07/2022)  Housing: Low Risk  (07/07/2022)  Transportation Needs: No Transportation Needs (07/07/2022)  Utilities: Not At Risk (07/07/2022)  Alcohol Screen: Low Risk  (11/28/2021)  Depression (PHQ2-9): Low Risk  (05/06/2022)   Financial Resource Strain: Low Risk  (11/28/2021)  Physical Activity: Inactive (11/28/2021)  Social Connections: Socially Integrated (11/28/2021)  Stress: Stress Concern Present (11/28/2021)  Tobacco Use: Low Risk  (07/06/2022)   SDOH Interventions:     Readmission Risk Interventions     No data to display

## 2022-07-08 NOTE — NC FL2 (Signed)
[place] MEDICAID FL2 LEVEL OF CARE FORM     IDENTIFICATION  Patient Name: [name] Birthdate: June 12, 1945 Sex: female Admission Date (Current Location): 07/06/2022  [facility] [place] Number:  [employer] and Address:  [facility] [address], [place]      Provider Number: [id number]  Attending Physician Name and Address:  Rodena Goldmann, DO  Relative Name and Phone Number:  Arnold, Goldy  [phone]    Current Level of Care: Hospital Recommended Level of Care: [place] Prior Approval Number:    Date Approved/Denied:   PASRR Number: [id number]  Discharge Plan: SNF    Current Diagnoses: Patient Active Problem List   Diagnosis Date Noted   Rhabdomyolysis 07/07/2022   Dehydration 07/07/2022   Transaminitis 07/07/2022   Hypotension 07/07/2022   SIRS (systemic inflammatory response syndrome) ([place]) 07/07/2022   Hypokalemia 07/07/2022   Stage 3b chronic kidney disease (CKD) ([place]) 07/07/2022   Hypothermia [id number]   Acute metabolic encephalopathy [id number]   BMI 26.0-26.9,adult 05/06/2022   Atrial fibrillation ([place]) 04/12/2022   Congenital heart failure ([place]) 04/12/2022   Malnutrition of moderate degree 01/14/2022   Respiratory failure requiring intubation ([place]) 01/11/2022   Septic shock ([place]) 01/10/2022   Pressure injury of skin 01/10/2022   Small bowel obstruction ([place]) 01/07/2022   Abnormal weight loss 11/28/2021   Family history of malignant neoplasm of digestive organs 11/28/2021   Intestinal malabsorption 11/28/2021   Renal insufficiency 12/08/2020   Degeneration of lumbar intervertebral disc 01/07/2020   Venous (peripheral) insufficiency 09/30/2019   Anemia 08/12/2017   Vitamin D deficiency 08/12/2017   Lumbar spondylosis with myelopathy 06/03/2017   Primary osteoarthritis of both knees 03/19/2017   Anxiety 02/17/2017   History of pulmonary embolus (PE) 07/09/2016   COPD (chronic  obstructive pulmonary disease) ([place]) 01/18/2016   Hyperlipidemia 01/18/2016   Depression 01/18/2016   Essential hypertension 01/18/2016   H/O spinal fusion 01/18/2016   Acquired hypothyroidism 01/18/2016   Thoracic aorta atherosclerosis ([place]) 01/18/2016   Chronic cholecystitis with calculus 10/04/2013    Orientation RESPIRATION BLADDER Height & Weight     Self, Situation, Place  Normal Incontinent Weight: 132 lb 15 oz (60.3 kg) Height:  5' 2"$  (157.5 cm)  BEHAVIORAL SYMPTOMS/MOOD NEUROLOGICAL BOWEL NUTRITION STATUS      Incontinent Diet (regular)  AMBULATORY STATUS COMMUNICATION OF NEEDS Skin   Limited Assist Verbally Normal                       Personal Care Assistance Level of Assistance  Bathing, Dressing, Feeding Bathing Assistance: Limited assistance Feeding assistance: Independent Dressing Assistance: Limited assistance     Functional Limitations Info             SPECIAL CARE FACTORS FREQUENCY  PT (By licensed PT)     PT Frequency: 5x/week              Contractures Contractures Info: Not present    Additional Factors Info  Code Status, Allergies, Psychotropic Code Status Info: DNR Allergies Info: Celebrex (Celecoxib), Keflet (Cephalexin), Penicillins, Sulfa Antibiotics, Kenalog (Triamcinolone Acetonide), Latex, Lisinopril, Mobic (Meloxicam), Penicillin G           Current Medications (07/08/2022):  This is the current hospital active medication list Current Facility-Administered Medications  Medication Dose Route Frequency Provider Last Rate Last Admin   0.9 %  sodium chloride infusion  250 mL Intravenous Continuous Howerter, Larkin Ina B, DO   Held at  07/07/22 0412   acetaminophen (TYLENOL) tablet 650 mg  650 mg Oral Q6H PRN Howerter, Justin B, DO       Or   acetaminophen (TYLENOL) suppository 650 mg  650 mg Rectal Q6H PRN Howerter, Justin B, DO       Chlorhexidine Gluconate Cloth 2 % PADS 6 each  6 each Topical Daily Howerter, Justin B, DO   6  each at 07/08/22 0939   citalopram (CELEXA) tablet 40 mg  40 mg Oral Daily Manuella Ghazi, Pratik D, DO   40 mg at 07/08/22 1023   dicyclomine (BENTYL) capsule 10 mg  10 mg Oral TID AC & HS Manuella Ghazi, Pratik D, DO   10 mg at 07/08/22 1023   lactated ringers infusion   Intravenous Continuous Heath Lark D, DO 75 mL/hr at 07/08/22 1106 Infusion Verify at 07/08/22 1106   levothyroxine (SYNTHROID) tablet 75 mcg  75 mcg Oral Q0600 Howerter, Larkin Ina B, DO   75 mcg at 07/08/22 [id number]   melatonin tablet 3 mg  3 mg Oral QHS PRN Howerter, Justin B, DO       norepinephrine (LEVOPHED) 81m in 2532m(0.016 mg/mL) premix infusion  2-10 mcg/min Intravenous Titrated Howerter, Justin B, DO   Stopped at 07/08/22 1059   pantoprazole (PROTONIX) EC tablet 40 mg  40 mg Oral q morning ShManuella GhaziPratik D, DO   40 mg at 07/08/22 1023   potassium chloride SA (KLOR-CON M) CR tablet 40 mEq  40 mEq Oral BID ShHeath Lark, DO   40 mEq at 07/08/22 [id number] rOPINIRole (REQUIP) tablet 3 mg  3 mg Oral QHS ShManuella GhaziPratik D, DO         Discharge Medications: Please see discharge summary for a list of discharge medications.  Relevant Imaging Results:  Relevant Lab Results:   Additional Information [phone] GullyLCSW

## 2022-07-08 NOTE — Evaluation (Signed)
Physical Therapy Evaluation Patient Details Name: [name] MRN: [id number] DOB: [birth year] Today's Date: 07/08/2022  History of Present Illness  Alexya Beldin is a 77 y.o. female with medical history significant for CKD 3B with baseline creatinine 1.7-1.9, essential hypertension, cortical thyroidism, bradycardia, who is admitted to [facility] on 07/06/2022 with acute metabolic encephalopathy after presenting from home to [facility] ED for evaluation of altered mental status.     The patient, who had recently been living with her son, moved in with her daughter 1 week ago.  With the exception of the patient's daughter, the remainder the family had not seen him in the patient over the course of the interval week. However, upon visiting the patient today, she was found to be on the floor at the daughter's home, covered in feces, lethargic, and confused relative to her baseline mental status, prompting EMS to be contacted.  EMS reportedly noted the patient to be hypothermic, with low blood pressure, and simply brought the patient to independent urgency department for further evaluation and management thereof.   Clinical Impression  Patient demonstrates slow labored movement for sitting up at bedside, has difficulty flexing knees during sit to stands, requiring feet blocked to avoid sliding forward, limit to a few shuffling side steps with narrow base of support and tolerated sitting up in chair after therapy - nursing staff aware.  Patient will benefit from continued skilled physical therapy in hospital and recommended venue below to increase strength, balance, endurance for safe ADLs and gait.         Recommendations for follow up therapy are one component of a multi-disciplinary discharge planning process, led by the attending physician.  Recommendations may be updated based on patient status, additional functional criteria and insurance authorization.  Follow Up Recommendations Skilled nursing-short  term rehab (<3 hours/day) Can patient physically be transported by private vehicle: No    Assistance Recommended at Discharge Intermittent Supervision/Assistance  Patient can return home with the following  A lot of help with bathing/dressing/bathroom;A lot of help with walking and/or transfers;Help with stairs or ramp for entrance;Assistance with cooking/housework    Equipment Recommendations None recommended by PT  Recommendations for Other Services       Functional Status Assessment Patient has had a recent decline in their functional status and demonstrates the ability to make significant improvements in function in a reasonable and predictable amount of time.     Precautions / Restrictions Precautions Precautions: Fall Restrictions Weight Bearing Restrictions: No      Mobility  Bed Mobility Overal bed mobility: Needs Assistance Bed Mobility: Supine to Sit     Supine to sit: Mod assist     General bed mobility comments: increased time, labored movement    Transfers Overall transfer level: Needs assistance Equipment used: Rolling walker (2 wheels) Transfers: Sit to/from Stand, Bed to chair/wheelchair/BSC Sit to Stand: Mod assist   Step pivot transfers: Mod assist, Max assist       General transfer comment: difficulty flexing knees due to stiffness, frequent scissoring of legs with narrow base of support    Ambulation/Gait Ambulation/Gait assistance: Max assist Gait Distance (Feet): 3 Feet Assistive device: Rolling walker (2 wheels) Gait Pattern/deviations: Decreased step length - right, Decreased step length - left, Decreased stride length, Narrow base of support, Scissoring, Shuffle Gait velocity: slow     General Gait Details: limited to a few slow labored slide steps with mostly shuffling of feet due weakness and narrow base of support requiring tactile assistance  to slide feet  Stairs            Wheelchair Mobility    Modified Rankin (Stroke  Patients Only)       Balance Overall balance assessment: Needs assistance Sitting-balance support: Feet supported, No upper extremity supported Sitting balance-Leahy Scale: Fair Sitting balance - Comments: seated at EOB   Standing balance support: Reliant on assistive device for balance, During functional activity, Bilateral upper extremity supported Standing balance-Leahy Scale: Poor Standing balance comment: using RW                             Pertinent Vitals/Pain Pain Assessment Pain Assessment: Faces Faces Pain Scale: Hurts a little bit Pain Location: BLE due to restlesss leg syndrome, "per patient" Pain Descriptors / Indicators: Discomfort, Restless Pain Intervention(s): Limited activity within patient's tolerance, Monitored during session, Repositioned    Home Living Family/patient expects to be discharged to:: Private residence Living Arrangements: Children Available Help at Discharge: Family;Available 24 hours/day Type of Home: House Home Access: Ramped entrance     Alternate Level Stairs-Number of Steps: Patient states she does not go upstairs Home Layout: Two level;Able to live on main level with bedroom/bathroom Home Equipment: Rolling Walker (2 wheels);Wheelchair - manual;Shower seat;BSC/3in1;Grab bars - tub/shower      Prior Function Prior Level of Function : Needs assist       Physical Assist : Mobility (physical);ADLs (physical) Mobility (physical): Bed mobility;Transfers;Gait;Stairs   Mobility Comments: Household ambulator using RW, "per patient" ADLs Comments: assisted by family     Hand Dominance   Dominant Hand: Right    Extremity/Trunk Assessment   Upper Extremity Assessment Upper Extremity Assessment: Generalized weakness    Lower Extremity Assessment Lower Extremity Assessment: Generalized weakness    Cervical / Trunk Assessment Cervical / Trunk Assessment: Kyphotic  Communication   Communication: HOH  Cognition  Arousal/Alertness: Awake/alert Behavior During Therapy: WFL for tasks assessed/performed Overall Cognitive Status: Within Functional Limits for tasks assessed                                          General Comments      Exercises     Assessment/Plan    PT Assessment Patient needs continued PT services  PT Problem List Decreased strength;Decreased activity tolerance;Decreased balance;Decreased mobility       PT Treatment Interventions DME instruction;Gait training;Functional mobility training;Therapeutic activities;Therapeutic exercise;Patient/family education;Balance training    PT Goals (Current goals can be found in the Care Plan section)  Acute Rehab PT Goals Patient Stated Goal: return home with family to assist PT Goal Formulation: With patient Time For Goal Achievement: 07/22/22 Potential to Achieve Goals: Good    Frequency Min 3X/week     Co-evaluation               AM-PAC PT "6 Clicks" Mobility  Outcome Measure Help needed turning from your back to your side while in a flat bed without using bedrails?: A Lot Help needed moving from lying on your back to sitting on the side of a flat bed without using bedrails?: A Lot Help needed moving to and from a bed to a chair (including a wheelchair)?: A Lot Help needed standing up from a chair using your arms (e.g., wheelchair or bedside chair)?: A Lot Help needed to walk in hospital room?: A Lot Help needed climbing  3-5 steps with a railing? : Total 6 Click Score: 11    End of Session   Activity Tolerance: Patient tolerated treatment well;Patient limited by fatigue Patient left: in chair;with call bell/phone within reach Nurse Communication: Mobility status PT Visit Diagnosis: Unsteadiness on feet (R26.81);Other abnormalities of gait and mobility (R26.89);Muscle weakness (generalized) (M62.81)    Time: [id number] PT Time Calculation (min) (ACUTE ONLY): 26 min   Charges:   PT  Evaluation $PT Eval Moderate Complexity: 1 Mod PT Treatments $Therapeutic Activity: 23-37 mins        2:12 PM, 07/08/22 Lonell Grandchild, MPT Physical Therapist with [facility] 336 [phone] office [phone] mobile phone

## 2022-07-09 DIAGNOSIS — G9341 Metabolic encephalopathy: Secondary | ICD-10-CM | POA: Diagnosis not present

## 2022-07-09 LAB — COMPREHENSIVE METABOLIC PANEL
ALT: 37 U/L (ref 0–44)
AST: 43 U/L — ABNORMAL HIGH (ref 15–41)
Albumin: 2.1 g/dL — ABNORMAL LOW (ref 3.5–5.0)
Alkaline Phosphatase: 49 U/L (ref 38–126)
Anion gap: 5 (ref 5–15)
BUN: 21 mg/dL (ref 8–23)
CO2: 18 mmol/L — ABNORMAL LOW (ref 22–32)
Calcium: 8 mg/dL — ABNORMAL LOW (ref 8.9–10.3)
Chloride: 112 mmol/L — ABNORMAL HIGH (ref 98–111)
Creatinine, Ser: 1.47 mg/dL — ABNORMAL HIGH (ref 0.44–1.00)
GFR, Estimated: 37 mL/min — ABNORMAL LOW (ref >60)
Glucose, Bld: 87 mg/dL (ref 70–99)
Potassium: 3.4 mmol/L — ABNORMAL LOW (ref 3.5–5.1)
Sodium: 135 mmol/L (ref 135–145)
Total Bilirubin: 0.8 mg/dL (ref 0.3–1.2)
Total Protein: 4.2 g/dL — ABNORMAL LOW (ref 6.5–8.1)

## 2022-07-09 LAB — CBC
HCT: 25.4 % — ABNORMAL LOW (ref 36.0–46.0)
Hemoglobin: 8.4 g/dL — ABNORMAL LOW (ref 12.0–15.0)
MCH: 29.6 pg (ref 26.0–34.0)
MCHC: 33.1 g/dL (ref 30.0–36.0)
MCV: 89.4 fL (ref 80.0–100.0)
Platelets: 197 10*3/uL (ref 150–400)
RBC: 2.84 MIL/uL — ABNORMAL LOW (ref 3.87–5.11)
RDW: 21.2 % — ABNORMAL HIGH (ref 11.5–15.5)
WBC: 7.8 10*3/uL (ref 4.0–10.5)
nRBC: 0 % (ref 0.0–0.2)

## 2022-07-09 LAB — MAGNESIUM: Magnesium: 1.7 mg/dL (ref 1.7–2.4)

## 2022-07-09 LAB — CK: Total CK: 138 U/L (ref 38–234)

## 2022-07-09 NOTE — Progress Notes (Signed)
PROGRESS NOTE    [name]  [id number] DOB: [birth year] DOA: 07/06/2022 PCP: Chevis Pretty, FNP   Brief Narrative:  [name] is a 77 y.o. female with medical history significant for CKD 3B with baseline creatinine 1.7-1.9, essential hypertension, hypothyroidism, bradycardia, who is admitted to [facility] on 07/06/2022 with acute metabolic encephalopathy after presenting from home to [facility] for evaluation of altered mental status.  She was admitted with acute metabolic encephalopathy in the setting of rhabdomyolysis with dehydration and hypokalemia.  She appears to have chronic diarrhea.  She apparently was not being well taken care of at home and APS has been involved to investigate her home situation.  Diarrhea appears to be improving as well as blood pressure.  Assessment & Plan:   Principal Problem:   Acute metabolic encephalopathy Active Problems:   Acquired hypothyroidism   Rhabdomyolysis   Dehydration   Transaminitis   Hypotension   SIRS (systemic inflammatory response syndrome) (HCC)   Hypokalemia   Stage 3b chronic kidney disease (CKD) (HCC)   Hypothermia  Assessment and Plan:   Acute metabolic encephalopathy-multifactorial-improving In the setting of hypokalemia with dehydration/hypotension Continue to correct as noted below Ammonia and TSH within normal limits PT recommending SNF  Hypotension in the setting of essential hypertension Hold home antihypertensives Likely in the setting of dehydration Does not appear to be cardiogenic or related to PE Wean norepinephrine as tolerated with ongoing aggressive IV fluid hydration  Severe hypokalemia-improving Likely in the setting of chronic diarrhea Continue to aggressively replete and monitor closely on telemetry  Rhabdomyolysis without AKI-resolved Discontinue IV fluid No further need for CK monitoring  Dehydration-improving Continue aggressive IV fluid replenishment Patient has chronic  diarrhea  Acute transaminitis-improving Currently downtrending, continue to monitor Likely due to rhabdomyolysis Acetaminophen level low Ultrasound abdomen pending  Generalized anxiety disorder Holding home Celexa for now until mentation fully improves  Hypothyroidism TSH within normal limits Continue home Synthroid  CKD stage IIIb Baseline creatinine 1.7-1.9 Continue close monitoring  Unsafe home environment APS investigation ongoing TOC notified  DVT prophylaxis: SCDs Code Status: DNR Family Communication: Discussed with son at bedside 2/11 Disposition Plan:  Status is: Inpatient Remains inpatient appropriate because: Continued need for IV fluid and medications  Consultants:  None  Procedures:  None  Antimicrobials:  None   Subjective: Patient seen and evaluated today with no new acute complaints or concerns. No acute concerns or events noted overnight.  She appears less confused and blood pressures are now stable.  Objective: Vitals:   07/09/22 0700 07/09/22 0740 07/09/22 0800 07/09/22 0900  BP: 103/62  (!) 113/55 93/65  Pulse: (!) 56  (!) 51 61  Resp: 11  17 16  $ Temp:  97.9 F (36.6 C)  97.8 F (36.6 C)  TempSrc:  Oral  Oral  SpO2: 96%  99% 99%  Weight:    71 kg  Height:    5' 2"$  (1.575 m)    Intake/Output Summary (Last 24 hours) at 07/09/2022 1033 Last data filed at 07/09/2022 0646 Gross per 24 hour  Intake 2072.44 ml  Output 450 ml  Net 1622.44 ml   Filed Weights   07/08/22 0600 07/09/22 0500 07/09/22 0900  Weight: 60.3 kg 67.6 kg 71 kg    Examination:  General exam: Appears calm and comfortable, confused and hard of hearing Respiratory system: Clear to auscultation. Respiratory effort normal. Cardiovascular system: S1 & S2 heard, RRR.  Gastrointestinal system: Abdomen is soft Central nervous system: Alert and awake Extremities:  No edema Skin: No significant lesions noted Psychiatry: Flat affect.    Data Reviewed: I have  personally reviewed following labs and imaging studies  CBC: Recent Labs  Lab 07/06/22 1850 07/07/22 0359 07/08/22 0354 07/09/22 0432  WBC 12.6* 14.4* 10.5 7.8  NEUTROABS 11.3* 12.3*  --   --   HGB 11.2* 9.3* 9.1* 8.4*  HCT 32.7* 27.2* 26.6* 25.4*  MCV 87.4 88.0 88.1 89.4  PLT 268 284 245 [id number]   Basic Metabolic Panel: Recent Labs  Lab 07/06/22 1850 07/07/22 0359 07/07/22 1305 07/08/22 0354 07/09/22 0432  NA 140 139  --  137 135  K 2.5* 2.5* 3.4* 3.2* 3.4*  CL 109 113*  --  114* 112*  CO2 17* 15*  --  16* 18*  GLUCOSE 134* 56*  --  90 87  BUN 40* 36*  --  26* 21  CREATININE 1.90* 1.79*  --  1.62* 1.47*  CALCIUM 8.8* 8.1*  --  8.0* 8.0*  MG 2.0 2.1  --   --  1.7  PHOS  --  3.1  --   --   --    GFR: Estimated Creatinine Clearance: 30.5 mL/min (A) (by C-G formula based on SCr of 1.47 mg/dL (H)). Liver Function Tests: Recent Labs  Lab 07/06/22 1850 07/07/22 0359 07/08/22 0354 07/09/22 0432  AST 119* 93* 63* 43*  ALT 55* 46* 41 37  ALKPHOS 63 52 50 49  BILITOT 1.0 0.9 0.8 0.8  PROT 5.7* 4.9* 4.5* 4.2*  ALBUMIN 3.1* 2.6* 2.3* 2.1*   No results for input(s): "LIPASE", "AMYLASE" in the last 168 hours. Recent Labs  Lab 07/07/22 0359  AMMONIA 21   Coagulation Profile: Recent Labs  Lab 07/06/22 1850 07/07/22 0359  INR 1.2 1.3*   Cardiac Enzymes: Recent Labs  Lab 07/06/22 1850 07/07/22 0359 07/08/22 0354 07/09/22 0432  CKTOTAL 1,514* 789* 295* 138   BNP (last 3 results) No results for input(s): "PROBNP" in the last 8760 hours. HbA1C: No results for input(s): "HGBA1C" in the last 72 hours. CBG: Recent Labs  Lab 07/06/22 1953  GLUCAP 156*   Lipid Profile: No results for input(s): "CHOL", "HDL", "LDLCALC", "TRIG", "CHOLHDL", "LDLDIRECT" in the last 72 hours. Thyroid Function Tests: Recent Labs    07/06/22 1850  TSH 1.070   Anemia Panel: No results for input(s): "VITAMINB12", "FOLATE", "FERRITIN", "TIBC", "IRON", "RETICCTPCT" in the last 72  hours. Sepsis Labs: Recent Labs  Lab 07/06/22 1850 07/06/22 2019  PROCALCITON  --  0.13  LATICACIDVEN 1.6 1.3    Recent Results (from the past 240 hour(s))  C Difficile Quick Screen w PCR reflex     Status: None   Collection Time: 07/06/22  6:41 PM   Specimen: STOOL  Result Value Ref Range Status   C Diff antigen NEGATIVE NEGATIVE Final   C Diff toxin NEGATIVE NEGATIVE Final   C Diff interpretation No C. difficile detected.  Final    Comment: Performed at [facility], [address] [place], [place] [address]  Blood Culture (routine x 2)     Status: None (Preliminary result)   Collection Time: 07/06/22  6:50 PM   Specimen: Right Antecubital; Blood  Result Value Ref Range Status   Specimen Description   Final    RIGHT ANTECUBITAL BOTTLES DRAWN AEROBIC AND ANAEROBIC   Special Requests Blood Culture adequate volume  Final   Culture   Final    NO GROWTH 2 DAYS Performed at [facility], [address] [place],  [place] [address]    Report Status PENDING  Incomplete  Blood Culture (routine x 2)     Status: None (Preliminary result)   Collection Time: 07/06/22  7:13 PM   Specimen: Site Not Specified; Blood  Result Value Ref Range Status   Specimen Description   Final    SITE NOT SPECIFIED BOTTLES DRAWN AEROBIC AND ANAEROBIC   Special Requests Blood Culture adequate volume  Final   Culture   Final    NO GROWTH 2 DAYS Performed at [facility], [address], [place], [place] [address]    Report Status PENDING  Incomplete  MRSA Next Gen by PCR, Nasal     Status: None   Collection Time: 07/06/22  9:53 PM   Specimen: Nasal Mucosa; Nasal Swab  Result Value Ref Range Status   MRSA by PCR Next Gen NOT DETECTED NOT DETECTED Final    Comment: (NOTE) The GeneXpert MRSA Assay (FDA approved for NASAL specimens only), is one component of a comprehensive MRSA colonization surveillance program. It is not intended to diagnose MRSA infection nor to guide or monitor treatment for MRSA  infections. Test performance is not FDA approved in patients less than 75 years old. Performed at [facility], [address], [place], [place] [address]          Radiology Studies: US Abdomen Limited RUQ (LIVER/GB)  Result Date: 07/07/2022 CLINICAL DATA:  Transaminitis. EXAM: ULTRASOUND ABDOMEN LIMITED RIGHT UPPER QUADRANT COMPARISON:  Abdomen and pelvis CT 01/17/2022 FINDINGS: Gallbladder: Surgically absent. Common bile duct: Diameter: 4 mm Liver: Coarsening of hepatic echotexture without focal abnormality evident. Portal vein is patent on color Doppler imaging with normal direction of blood flow towards the liver. Other: None. IMPRESSION: 1. Coarsening of hepatic echotexture without focal abnormality evident. 2. No biliary dilatation. Electronically Signed   By: Misty Stanley M.D.   On: 07/07/2022 13:21        Scheduled Meds:  Chlorhexidine Gluconate Cloth  6 each Topical Daily   citalopram  40 mg Oral Daily   dicyclomine  10 mg Oral TID AC & HS   feeding supplement  237 mL Oral BID BM   levothyroxine  75 mcg Oral Q0600   multivitamin-lutein  1 capsule Oral Daily   pantoprazole  40 mg Oral q morning   potassium chloride  40 mEq Oral BID   rOPINIRole  3 mg Oral QHS   Continuous Infusions:  sodium chloride Stopped (07/07/22 0412)   norepinephrine (LEVOPHED) Adult infusion Stopped (07/08/22 2115)     LOS: 3 days    Time spent: 35 minutes    Ronnel Zuercher Darleen Crocker, DO [facility]  If 7PM-7AM, please contact night-coverage [email or web address] 07/09/2022, 10:33 AM

## 2022-07-09 NOTE — TOC Progression Note (Addendum)
Transition of Care [facility]) - Progression Note    Patient Details  Name: [name] MRN: [id number] Date of Birth: 1946/04/23  Transition of Care [facility]) CM/SW [place], [place] Phone Number: 07/09/2022, 12:09 PM  Clinical Narrative:    CSW spoke with pt to review bed offers, pt requested that CSW reach out to her son. CSW spoke with pts son who requested CSW send referral out further to [place] facilities and [employer]. CSW sent referral out as requested. TOC to follow.   Addendum 3pm: CSW updated son of additional bed offers. Son inquiring as to if [facility] has a private room. CSW spoke to [facility] in admissions who states they have a private room if pt would prefer that. CSW updated pts son who states family would like to accept private room at [facility] for SNF. CSW updated Melissa of this. TOC to follow.   Expected Discharge Plan: [place] Barriers to Discharge: Continued Medical Work up  Expected Discharge Plan and Services       Living arrangements for the past 2 months: Single Family Home                                       Social Determinants of Health (SDOH) Interventions SDOH Screenings   Food Insecurity: No Food Insecurity (07/07/2022)  Housing: Low Risk  (07/07/2022)  Transportation Needs: No Transportation Needs (07/07/2022)  Utilities: Not At Risk (07/07/2022)  Alcohol Screen: Low Risk  (11/28/2021)  Depression (PHQ2-9): Low Risk  (05/06/2022)  Financial Resource Strain: Low Risk  (11/28/2021)  Physical Activity: Inactive (11/28/2021)  Social Connections: Socially Integrated (11/28/2021)  Stress: Stress Concern Present (11/28/2021)  Tobacco Use: Low Risk  (07/06/2022)    Readmission Risk Interventions     No data to display

## 2022-07-09 NOTE — Progress Notes (Signed)
Patient assessed. Sacral wound remains as previously documented. Very red, with small open area to direct center. Patient also has left hip area that is red, appears to be trauma however. Groin is very red. Heels red but blanche and have protective dressing to them. Patients hands are slightly swollen with bruising.

## 2022-07-09 NOTE — Progress Notes (Signed)
Was on Levo for a short time this shift, a few soft BP's after it was off but rebounded well, pt slightly confused at times and forgetful be pleasant, MULTIPLE loose stools, may consider adding imodium.

## 2022-07-09 NOTE — Progress Notes (Signed)
Report called and given to Deanna, LPN on Dept. [id number]. Pt to be transferred to room 328 via bed.

## 2022-07-10 DIAGNOSIS — R197 Diarrhea, unspecified: Secondary | ICD-10-CM | POA: Diagnosis not present

## 2022-07-10 DIAGNOSIS — I700 Atherosclerosis of aorta: Secondary | ICD-10-CM | POA: Diagnosis not present

## 2022-07-10 DIAGNOSIS — G9340 Encephalopathy, unspecified: Secondary | ICD-10-CM | POA: Diagnosis not present

## 2022-07-10 DIAGNOSIS — I129 Hypertensive chronic kidney disease with stage 1 through stage 4 chronic kidney disease, or unspecified chronic kidney disease: Secondary | ICD-10-CM | POA: Diagnosis not present

## 2022-07-10 DIAGNOSIS — E785 Hyperlipidemia, unspecified: Secondary | ICD-10-CM | POA: Diagnosis not present

## 2022-07-10 DIAGNOSIS — I5032 Chronic diastolic (congestive) heart failure: Secondary | ICD-10-CM | POA: Diagnosis not present

## 2022-07-10 DIAGNOSIS — I890 Lymphedema, not elsewhere classified: Secondary | ICD-10-CM | POA: Diagnosis not present

## 2022-07-10 DIAGNOSIS — R279 Unspecified lack of coordination: Secondary | ICD-10-CM | POA: Diagnosis not present

## 2022-07-10 DIAGNOSIS — R5381 Other malaise: Secondary | ICD-10-CM | POA: Diagnosis not present

## 2022-07-10 DIAGNOSIS — R001 Bradycardia, unspecified: Secondary | ICD-10-CM | POA: Diagnosis not present

## 2022-07-10 DIAGNOSIS — Z7401 Bed confinement status: Secondary | ICD-10-CM | POA: Diagnosis not present

## 2022-07-10 DIAGNOSIS — M170 Bilateral primary osteoarthritis of knee: Secondary | ICD-10-CM | POA: Diagnosis not present

## 2022-07-10 DIAGNOSIS — G9341 Metabolic encephalopathy: Secondary | ICD-10-CM | POA: Diagnosis not present

## 2022-07-10 DIAGNOSIS — Z79899 Other long term (current) drug therapy: Secondary | ICD-10-CM | POA: Diagnosis not present

## 2022-07-10 DIAGNOSIS — R600 Localized edema: Secondary | ICD-10-CM | POA: Diagnosis not present

## 2022-07-10 DIAGNOSIS — T796XXD Traumatic ischemia of muscle, subsequent encounter: Secondary | ICD-10-CM | POA: Diagnosis not present

## 2022-07-10 DIAGNOSIS — E860 Dehydration: Secondary | ICD-10-CM | POA: Diagnosis not present

## 2022-07-10 DIAGNOSIS — F411 Generalized anxiety disorder: Secondary | ICD-10-CM | POA: Diagnosis not present

## 2022-07-10 DIAGNOSIS — E876 Hypokalemia: Secondary | ICD-10-CM | POA: Diagnosis not present

## 2022-07-10 DIAGNOSIS — K219 Gastro-esophageal reflux disease without esophagitis: Secondary | ICD-10-CM | POA: Diagnosis not present

## 2022-07-10 DIAGNOSIS — N1832 Chronic kidney disease, stage 3b: Secondary | ICD-10-CM | POA: Diagnosis not present

## 2022-07-10 DIAGNOSIS — F32A Depression, unspecified: Secondary | ICD-10-CM | POA: Diagnosis not present

## 2022-07-10 DIAGNOSIS — E559 Vitamin D deficiency, unspecified: Secondary | ICD-10-CM | POA: Diagnosis not present

## 2022-07-10 DIAGNOSIS — E039 Hypothyroidism, unspecified: Secondary | ICD-10-CM | POA: Diagnosis not present

## 2022-07-10 DIAGNOSIS — Z7189 Other specified counseling: Secondary | ICD-10-CM | POA: Diagnosis not present

## 2022-07-10 DIAGNOSIS — I959 Hypotension, unspecified: Secondary | ICD-10-CM | POA: Diagnosis not present

## 2022-07-10 LAB — CBC
HCT: 27.8 % — ABNORMAL LOW (ref 36.0–46.0)
Hemoglobin: 9.1 g/dL — ABNORMAL LOW (ref 12.0–15.0)
MCH: 30.1 pg (ref 26.0–34.0)
MCHC: 32.7 g/dL (ref 30.0–36.0)
MCV: 92.1 fL (ref 80.0–100.0)
Platelets: 160 10*3/uL (ref 150–400)
RBC: 3.02 MIL/uL — ABNORMAL LOW (ref 3.87–5.11)
RDW: 21.2 % — ABNORMAL HIGH (ref 11.5–15.5)
WBC: 8 10*3/uL (ref 4.0–10.5)
nRBC: 0 % (ref 0.0–0.2)

## 2022-07-10 LAB — COMPREHENSIVE METABOLIC PANEL
ALT: 31 U/L (ref 0–44)
AST: 32 U/L (ref 15–41)
Albumin: 2.1 g/dL — ABNORMAL LOW (ref 3.5–5.0)
Alkaline Phosphatase: 50 U/L (ref 38–126)
Anion gap: 4 — ABNORMAL LOW (ref 5–15)
BUN: 19 mg/dL (ref 8–23)
CO2: 19 mmol/L — ABNORMAL LOW (ref 22–32)
Calcium: 8.2 mg/dL — ABNORMAL LOW (ref 8.9–10.3)
Chloride: 116 mmol/L — ABNORMAL HIGH (ref 98–111)
Creatinine, Ser: 1.48 mg/dL — ABNORMAL HIGH (ref 0.44–1.00)
GFR, Estimated: 37 mL/min — ABNORMAL LOW (ref >60)
Glucose, Bld: 79 mg/dL (ref 70–99)
Potassium: 4 mmol/L (ref 3.5–5.1)
Sodium: 139 mmol/L (ref 135–145)
Total Bilirubin: 0.8 mg/dL (ref 0.3–1.2)
Total Protein: 4.3 g/dL — ABNORMAL LOW (ref 6.5–8.1)

## 2022-07-10 LAB — MAGNESIUM: Magnesium: 1.7 mg/dL (ref 1.7–2.4)

## 2022-07-10 MED ORDER — ENSURE ENLIVE PO LIQD: 237.0000 mL | Freq: Two times a day (BID) | ORAL | 12 refills | Status: DC

## 2022-07-10 NOTE — Progress Notes (Signed)
Attempted to call report to [facility], no answer.

## 2022-07-10 NOTE — Progress Notes (Signed)
Pt assisted up into recliner by staff x2. Pt able to stand but very weak, only shuffling steps to recliner. Pt having small "oozing" type 6 bowel movements. Perineal and buttocks reddened and sore to wipe. Pt cleaned and barrier cream applied. Pt aware of pending discharge to [facility], states agreement.

## 2022-07-10 NOTE — Progress Notes (Signed)
Patient had no complaint of pain this shift. Medications taken with out any issue. Patient had 1 lose bowel movement this shift. Continued to monitor patient.

## 2022-07-10 NOTE — Discharge Summary (Signed)
Physician Discharge Summary  [name] [id number] DOB: 1946/03/02 DOA: 07/06/2022  PCP: Chevis Pretty, FNP  Admit date: 07/06/2022  Discharge date: 07/10/2022  Admitted From:Home  Disposition:  SNF  Recommendations for Outpatient Follow-up:  Follow up with PCP in 1-2 weeks APS to continue ongoing investigation Continue medications as noted below and resume Lasix 3 times per week once blood pressures stabilize  Home Health: None  Equipment/Devices: None  Discharge Condition:Stable  CODE STATUS: DNR  Diet recommendation: Heart Healthy  Brief/Interim Summary:  [name] is a 77 y.o. female with medical history significant for CKD 3B with baseline creatinine 1.7-1.9, essential hypertension, hypothyroidism, bradycardia, who is admitted to [facility] on 07/06/2022 with acute metabolic encephalopathy after presenting from home to [facility] ED for evaluation of altered mental status.  She was admitted with acute metabolic encephalopathy in the setting of rhabdomyolysis with dehydration and hypokalemia.  She appears to have chronic diarrhea.  She apparently was not being well taken care of at home and APS has been involved to investigate her home situation.  Diarrhea has improved, but she does have 1-2 loose bowel movements per day.  Blood pressures are stabilized and she has been seen by physical therapy with recommendations for inpatient rehabilitation.  APS investigation was already underway and will continue while she is at facility.  Rhabdomyolysis has resolved.  Continue to follow labs and maintain on medications as noted below.  Discharge Diagnoses:  Principal Problem:   Acute metabolic encephalopathy Active Problems:   Acquired hypothyroidism   Rhabdomyolysis   Dehydration   Transaminitis   Hypotension   SIRS (systemic inflammatory response syndrome) (HCC)   Hypokalemia   Stage 3b chronic kidney disease (CKD) ([place])   Hypothermia  Principal discharge  diagnosis: Acute metabolic encephalopathy-multifactorial in the setting of dehydration with hypokalemia.  Rhabdomyolysis with AKI.  Discharge Instructions  Discharge Instructions     Diet - low sodium heart healthy   Complete by: As directed    Increase activity slowly   Complete by: As directed       Allergies as of 07/10/2022       Reactions   Celebrex [celecoxib] Swelling   Keflet [cephalexin] Swelling   Penicillins Hives, Swelling   DID THE REACTION INVOLVE: Swelling of the face/tongue/throat, SOB, or low BP? Yes-swelling-hives Sudden or severe rash/hives, skin peeling, or the inside of the mouth or nose? Unknown Did it require medical treatment? Unknown When did it last happen?    over 10 years   If all above answers are "NO", may proceed with cephalosporin use.   Sulfa Antibiotics Swelling   Kenalog [triamcinolone Acetonide]    unknown   Latex    Lisinopril Other (See Comments)   unknown   Mobic [meloxicam]    SWELLING   Penicillin G Other (See Comments)        Medication List     STOP taking these medications    amLODipine 5 MG tablet Commonly known as: NORVASC   colestipol 5 g granules Commonly known as: Colestid   furosemide 20 MG tablet Commonly known as: LASIX       TAKE these medications    cholestyramine 4 g packet Commonly known as: Questran Take 1 packet (4 g total) by mouth 3 (three) times daily with meals.   citalopram 40 MG tablet Commonly known as: CeleXA Take 1 tablet (40 mg total) by mouth daily.   dicyclomine 10 MG capsule Commonly known as: BENTYL TAKE ONE CAPSULE BY MOUTH THREE  TIMES DAILY BEFORE meals   feeding supplement Liqd Take 237 mLs by mouth 2 (two) times daily between meals.   levothyroxine 75 MCG tablet Commonly known as: SYNTHROID Take 1 tablet (75 mcg total) by mouth daily.   loperamide 2 MG capsule Commonly known as: IMODIUM Take 1 capsule (2 mg total) by mouth as needed for diarrhea or loose stools.    pantoprazole 40 MG tablet Commonly known as: PROTONIX Take 40 mg by mouth every morning.   potassium chloride 10 MEQ tablet Commonly known as: KLOR-CON M Take 1 tablet (10 mEq total) by mouth 2 (two) times daily.   rOPINIRole 3 MG tablet Commonly known as: REQUIP Take 1 tablet (3 mg total) by mouth at bedtime.        Follow-up Information     Chevis Pretty, FNP. Schedule an appointment as soon as possible for a visit in 1 week(s).   Specialty: Family Medicine Contact information: [address] [place] [address] [phone]                Allergies  Allergen Reactions   Celebrex [Celecoxib] Swelling   Keflet [Cephalexin] Swelling   Penicillins Hives and Swelling    DID THE REACTION INVOLVE: Swelling of the face/tongue/throat, SOB, or low BP? Yes-swelling-hives Sudden or severe rash/hives, skin peeling, or the inside of the mouth or nose? Unknown Did it require medical treatment? Unknown When did it last happen?    over 10 years   If all above answers are "NO", may proceed with cephalosporin use.    Sulfa Antibiotics Swelling   Kenalog [Triamcinolone Acetonide]     unknown   Latex    Lisinopril Other (See Comments)    unknown   Mobic [Meloxicam]     SWELLING   Penicillin G Other (See Comments)    Consultations: None   Procedures/Studies: US Abdomen Limited RUQ (LIVER/GB)  Result Date: 07/07/2022 CLINICAL DATA:  Transaminitis. EXAM: ULTRASOUND ABDOMEN LIMITED RIGHT UPPER QUADRANT COMPARISON:  Abdomen and pelvis CT 01/17/2022 FINDINGS: Gallbladder: Surgically absent. Common bile duct: Diameter: 4 mm Liver: Coarsening of hepatic echotexture without focal abnormality evident. Portal vein is patent on color Doppler imaging with normal direction of blood flow towards the liver. Other: None. IMPRESSION: 1. Coarsening of hepatic echotexture without focal abnormality evident. 2. No biliary dilatation. Electronically Signed   By: Misty Stanley  M.D.   On: 07/07/2022 13:21   CT Head Wo Contrast  Result Date: 07/06/2022 CLINICAL DATA:  Altered mental status.  No history of trauma. EXAM: CT HEAD WITHOUT CONTRAST TECHNIQUE: Contiguous axial images were obtained from the base of the skull through the vertex without intravenous contrast. RADIATION DOSE REDUCTION: This exam was performed according to the departmental dose-optimization program which includes automated exposure control, adjustment of the mA and/or kV according to patient size and/or use of iterative reconstruction technique. COMPARISON:  Head CT 01/16/2022 FINDINGS: Brain: Again noted are mild cerebral atrophy, small-vessel disease and atrophic ventriculomegaly. There is no midline shift. Cerebellum and brainstem are unremarkable. No new asymmetry is seen concerning for an acute cortical based infarct, hemorrhage or mass. Basal cisterns are clear. Vascular: The carotid siphons are heavily calcified. There are patchy calcifications of the V4 distal vertebral arteries. No hyperdense central vessel. Skull: Negative orbits and orbital contents. There is mild membrane thickening in the ethmoid air cells and left maxillary sinus. Stable 1.9 cm retention cyst in the left maxillary sinus. 1.3 cm retention cyst left frontal sinus, was previously 1.7  cm. Other paranasal sinuses are clear. Nasal septum is midline. Sinuses/Orbits: Mild patchy fluid is again noted in the lower mastoid air cells on the right-greater-than-left, seen previously and unchanged. Both middle ear cavities are clear. A wax impaction again noted in the right external auditory canal. Other: None. IMPRESSION: 1. No acute intracranial CT findings or interval changes. Chronic change. 2. Sinus membrane disease and chronic small mastoid effusions. 3. Chronic wax impaction in the right external auditory canal. Electronically Signed   By: Telford Nab M.D.   On: 07/06/2022 21:01   DG Chest Port 1 View  Result Date: 07/06/2022 CLINICAL  DATA:  Questionable sepsis - evaluate for abnormality EXAM: PORTABLE CHEST 1 VIEW COMPARISON:  Chest x-ray 01/22/2022 FINDINGS: The heart and mediastinal contours are within normal limits. Aortic calcification. At least moderate sized hiatal hernia. No focal consolidation. Coarsened interstitial markings with no overt pulmonary edema. Nonspecific blunting of bilateral costophrenic angles. No pleural effusion. No pneumothorax. No acute osseous abnormality. IMPRESSION: 1. No active disease. 2. At least moderate sized hiatal hernia. 3.  Aortic Atherosclerosis (ICD10-I70.0). Electronically Signed   By: Iven Finn M.D.   On: 07/06/2022 19:02     Discharge Exam: Vitals:   07/09/22 2222 07/10/22 0334  BP: 94/66 103/64  Pulse: (!) 55 (!) 53  Resp: 20 18  Temp: 98 F (36.7 C) 97.9 F (36.6 C)  SpO2: 99% 97%   Vitals:   07/09/22 2211 07/09/22 2222 07/10/22 0334 07/10/22 0500  BP: 92/79 94/66 103/64   Pulse: (!) 57 (!) 55 (!) 53   Resp: 18 20 18   $ Temp: 97.8 F (36.6 C) 98 F (36.7 C) 97.9 F (36.6 C)   TempSrc:  Oral Oral   SpO2: 100% 99% 97%   Weight:    72.6 kg  Height:        General: Pt is alert, awake, not in acute distress Cardiovascular: RRR, S1/S2 +, no rubs, no gallops Respiratory: CTA bilaterally, no wheezing, no rhonchi Abdominal: Soft, NT, ND, bowel sounds + Extremities: no edema, no cyanosis    The results of significant diagnostics from this hospitalization (including imaging, microbiology, ancillary and laboratory) are listed below for reference.     Microbiology: Recent Results (from the past 240 hour(s))  C Difficile Quick Screen w PCR reflex     Status: None   Collection Time: 07/06/22  6:41 PM   Specimen: STOOL  Result Value Ref Range Status   C Diff antigen NEGATIVE NEGATIVE Final   C Diff toxin NEGATIVE NEGATIVE Final   C Diff interpretation No C. difficile detected.  Final    Comment: Performed at [facility], [address] [place], [place]  [address]  Blood Culture (routine x 2)     Status: None (Preliminary result)   Collection Time: 07/06/22  6:50 PM   Specimen: Right Antecubital; Blood  Result Value Ref Range Status   Specimen Description   Final    RIGHT ANTECUBITAL BOTTLES DRAWN AEROBIC AND ANAEROBIC   Special Requests Blood Culture adequate volume  Final   Culture   Final    NO GROWTH 4 DAYS Performed at [facility], [address] [place], [place] [address]    Report Status PENDING  Incomplete  Blood Culture (routine x 2)     Status: None (Preliminary result)   Collection Time: 07/06/22  7:13 PM   Specimen: Site Not Specified; Blood  Result Value Ref Range Status   Specimen Description   Final  SITE NOT SPECIFIED BOTTLES DRAWN AEROBIC AND ANAEROBIC   Special Requests Blood Culture adequate volume  Final   Culture   Final    NO GROWTH 4 DAYS Performed at [facility], [address] [place], [place] [address]    Report Status PENDING  Incomplete  MRSA Next Gen by PCR, Nasal     Status: None   Collection Time: 07/06/22  9:53 PM   Specimen: Nasal Mucosa; Nasal Swab  Result Value Ref Range Status   MRSA by PCR Next Gen NOT DETECTED NOT DETECTED Final    Comment: (NOTE) The GeneXpert MRSA Assay (FDA approved for NASAL specimens only), is one component of a comprehensive MRSA colonization surveillance program. It is not intended to diagnose MRSA infection nor to guide or monitor treatment for MRSA infections. Test performance is not FDA approved in patients less than 55 years old. Performed at [facility], [address] [place],  [address]      Labs: BNP (last 3 results) Recent Labs    07/06/22 1850  BNP [id number]*   Basic Metabolic Panel: Recent Labs  Lab 07/06/22 1850 07/07/22 0359 07/07/22 1305 07/08/22 0354 07/09/22 0432 07/10/22 0508  NA 140 139  --  137 135 139  K 2.5* 2.5* 3.4* 3.2* 3.4* 4.0  CL 109 113*  --  114* 112* 116*  CO2 17* 15*  --  16* 18* 19*  GLUCOSE 134* 56*  --  90 87  79  BUN 40* 36*  --  26* 21 19  CREATININE 1.90* 1.79*  --  1.62* 1.47* 1.48*  CALCIUM 8.8* 8.1*  --  8.0* 8.0* 8.2*  MG 2.0 2.1  --   --  1.7 1.7  PHOS  --  3.1  --   --   --   --    Liver Function Tests: Recent Labs  Lab 07/06/22 1850 07/07/22 0359 07/08/22 0354 07/09/22 0432 07/10/22 0508  AST 119* 93* 63* 43* 32  ALT 55* 46* 41 37 31  ALKPHOS 63 52 50 49 50  BILITOT 1.0 0.9 0.8 0.8 0.8  PROT 5.7* 4.9* 4.5* 4.2* 4.3*  ALBUMIN 3.1* 2.6* 2.3* 2.1* 2.1*   No results for input(s): "LIPASE", "AMYLASE" in the last 168 hours. Recent Labs  Lab 07/07/22 0359  AMMONIA 21   CBC: Recent Labs  Lab 07/06/22 1850 07/07/22 0359 07/08/22 0354 07/09/22 0432 07/10/22 0508  WBC 12.6* 14.4* 10.5 7.8 8.0  NEUTROABS 11.3* 12.3*  --   --   --   HGB 11.2* 9.3* 9.1* 8.4* 9.1*  HCT 32.7* 27.2* 26.6* 25.4* 27.8*  MCV 87.4 88.0 88.1 89.4 92.1  PLT 268 284 245 197 160   Cardiac Enzymes: Recent Labs  Lab 07/06/22 1850 07/07/22 0359 07/08/22 0354 07/09/22 0432  CKTOTAL 1,514* 789* 295* 138   BNP: Invalid input(s): "POCBNP" CBG: Recent Labs  Lab 07/06/22 1953  GLUCAP 156*   D-Dimer No results for input(s): "DDIMER" in the last 72 hours. Hgb A1c No results for input(s): "HGBA1C" in the last 72 hours. Lipid Profile No results for input(s): "CHOL", "HDL", "LDLCALC", "TRIG", "CHOLHDL", "LDLDIRECT" in the last 72 hours. Thyroid function studies No results for input(s): "TSH", "T4TOTAL", "T3FREE", "THYROIDAB" in the last 72 hours.  Invalid input(s): "FREET3" Anemia work up No results for input(s): "VITAMINB12", "FOLATE", "FERRITIN", "TIBC", "IRON", "RETICCTPCT" in the last 72 hours. Urinalysis    Component Value Date/Time   COLORURINE AMBER (A) 07/06/2022 2003   APPEARANCEUR HAZY (A) 07/06/2022 2003  APPEARANCEUR Clear 03/01/2021 1257   LABSPEC 1.013 07/06/2022 2003   PHURINE 5.0 07/06/2022 2003   GLUCOSEU NEGATIVE 07/06/2022 2003   HGBUR MODERATE (A) 07/06/2022 2003    BILIRUBINUR NEGATIVE 07/06/2022 2003   BILIRUBINUR Negative 03/01/2021 [place] NEGATIVE 07/06/2022 2003   PROTEINUR 30 (A) 07/06/2022 2003   NITRITE NEGATIVE 07/06/2022 2003   LEUKOCYTESUR NEGATIVE 07/06/2022 2003   Sepsis Labs Recent Labs  Lab 07/07/22 0359 07/08/22 0354 07/09/22 0432 07/10/22 0508  WBC 14.4* 10.5 7.8 8.0   Microbiology Recent Results (from the past 240 hour(s))  C Difficile Quick Screen w PCR reflex     Status: None   Collection Time: 07/06/22  6:41 PM   Specimen: STOOL  Result Value Ref Range Status   C Diff antigen NEGATIVE NEGATIVE Final   C Diff toxin NEGATIVE NEGATIVE Final   C Diff interpretation No C. difficile detected.  Final    Comment: Performed at [facility], [address], [place], [place] [address]  Blood Culture (routine x 2)     Status: None (Preliminary result)   Collection Time: 07/06/22  6:50 PM   Specimen: Right Antecubital; Blood  Result Value Ref Range Status   Specimen Description   Final    RIGHT ANTECUBITAL BOTTLES DRAWN AEROBIC AND ANAEROBIC   Special Requests Blood Culture adequate volume  Final   Culture   Final    NO GROWTH 4 DAYS Performed at [facility], [address] [place], [place] [address]    Report Status PENDING  Incomplete  Blood Culture (routine x 2)     Status: None (Preliminary result)   Collection Time: 07/06/22  7:13 PM   Specimen: Site Not Specified; Blood  Result Value Ref Range Status   Specimen Description   Final    SITE NOT SPECIFIED BOTTLES DRAWN AEROBIC AND ANAEROBIC   Special Requests Blood Culture adequate volume  Final   Culture   Final    NO GROWTH 4 DAYS Performed at [facility], [address] [place], [place] [address]    Report Status PENDING  Incomplete  MRSA Next Gen by PCR, Nasal     Status: None   Collection Time: 07/06/22  9:53 PM   Specimen: Nasal Mucosa; Nasal Swab  Result Value Ref Range Status   MRSA by PCR Next Gen NOT DETECTED NOT DETECTED Final    Comment:  (NOTE) The GeneXpert MRSA Assay (FDA approved for NASAL specimens only), is one component of a comprehensive MRSA colonization surveillance program. It is not intended to diagnose MRSA infection nor to guide or monitor treatment for MRSA infections. Test performance is not FDA approved in patients less than 43 years old. Performed at [facility], [address], [place], [place] [address]      Time coordinating discharge: 35 minutes  SIGNED:   Rodena Goldmann, DO [facility] 07/10/2022, 10:33 AM  If 7PM-7AM, please contact night-coverage [email or web address]

## 2022-07-10 NOTE — TOC Transition Note (Signed)
Transition of Care [facility]) - CM/SW Discharge Note   Patient Details  Name: [name] MRN: [id number] Date of Birth: May 16, 1946  Transition of Care [facility]) CM/SW Contact:  Shade Flood, LCSW Phone Number: 07/10/2022, 11:10 AM   Clinical Narrative:     Pt stable for dc to SNF today per MD. Updated Lenna Sciara at [facility] and they can accept pt today.  DC clinical sent electronically. RN to call report. EMS arranged.  No other TOC needs for dc.  Final next level of care: Skilled Nursing Facility Barriers to Discharge: Barriers Resolved   Patient Goals and CMS Choice CMS Medicare.gov Compare Post Acute Care list provided to:: Patient Represenative (must comment) Choice offered to / list presented to : Adult Children  Discharge Placement                Patient chooses bed at: [facility] Patient to be transferred to facility by: EMS Name of family member notified: Maragret Bohlken Patient and family notified of of transfer: 07/10/22  Discharge Plan and Services Additional resources added to the After Visit Summary for                                       Social Determinants of Health (SDOH) Interventions SDOH Screenings   Food Insecurity: No Food Insecurity (07/07/2022)  Housing: Low Risk  (07/07/2022)  Transportation Needs: No Transportation Needs (07/07/2022)  Utilities: Not At Risk (07/07/2022)  Alcohol Screen: Low Risk  (11/28/2021)  Depression (PHQ2-9): Low Risk  (05/06/2022)  Financial Resource Strain: Low Risk  (11/28/2021)  Physical Activity: Inactive (11/28/2021)  Social Connections: Socially Integrated (11/28/2021)  Stress: Stress Concern Present (11/28/2021)  Tobacco Use: Low Risk  (07/06/2022)     Readmission Risk Interventions     No data to display

## 2022-07-10 NOTE — Care Management Important Message (Signed)
Important Message  Patient Details  Name: [name] MRN: [id number] Date of Birth: 06/21/1945   Medicare Important Message Given:  Yes     [name] 07/10/2022, 2:45 PM

## 2022-07-11 DIAGNOSIS — E876 Hypokalemia: Secondary | ICD-10-CM | POA: Diagnosis not present

## 2022-07-11 DIAGNOSIS — R001 Bradycardia, unspecified: Secondary | ICD-10-CM | POA: Diagnosis not present

## 2022-07-11 DIAGNOSIS — G9341 Metabolic encephalopathy: Secondary | ICD-10-CM | POA: Diagnosis not present

## 2022-07-11 DIAGNOSIS — T796XXD Traumatic ischemia of muscle, subsequent encounter: Secondary | ICD-10-CM | POA: Diagnosis not present

## 2022-07-11 DIAGNOSIS — Z79899 Other long term (current) drug therapy: Secondary | ICD-10-CM | POA: Diagnosis not present

## 2022-07-11 DIAGNOSIS — E860 Dehydration: Secondary | ICD-10-CM | POA: Diagnosis not present

## 2022-07-11 DIAGNOSIS — Z7189 Other specified counseling: Secondary | ICD-10-CM | POA: Diagnosis not present

## 2022-07-11 LAB — CULTURE, BLOOD (ROUTINE X 2)
Culture: NO GROWTH
Culture: NO GROWTH
Special Requests: ADEQUATE
Special Requests: ADEQUATE

## 2022-07-15 DIAGNOSIS — K219 Gastro-esophageal reflux disease without esophagitis: Secondary | ICD-10-CM | POA: Diagnosis not present

## 2022-07-15 DIAGNOSIS — Z79899 Other long term (current) drug therapy: Secondary | ICD-10-CM | POA: Diagnosis not present

## 2022-07-15 DIAGNOSIS — R197 Diarrhea, unspecified: Secondary | ICD-10-CM | POA: Diagnosis not present

## 2022-07-15 DIAGNOSIS — I959 Hypotension, unspecified: Secondary | ICD-10-CM | POA: Diagnosis not present

## 2022-07-16 DIAGNOSIS — N1832 Chronic kidney disease, stage 3b: Secondary | ICD-10-CM | POA: Diagnosis not present

## 2022-07-16 DIAGNOSIS — Z79899 Other long term (current) drug therapy: Secondary | ICD-10-CM | POA: Diagnosis not present

## 2022-07-16 DIAGNOSIS — F411 Generalized anxiety disorder: Secondary | ICD-10-CM | POA: Diagnosis not present

## 2022-07-16 DIAGNOSIS — I700 Atherosclerosis of aorta: Secondary | ICD-10-CM | POA: Diagnosis not present

## 2022-07-17 DIAGNOSIS — N1832 Chronic kidney disease, stage 3b: Secondary | ICD-10-CM | POA: Diagnosis not present

## 2022-07-17 DIAGNOSIS — F411 Generalized anxiety disorder: Secondary | ICD-10-CM | POA: Diagnosis not present

## 2022-07-17 DIAGNOSIS — E876 Hypokalemia: Secondary | ICD-10-CM | POA: Diagnosis not present

## 2022-07-17 DIAGNOSIS — I700 Atherosclerosis of aorta: Secondary | ICD-10-CM | POA: Diagnosis not present

## 2022-07-17 DIAGNOSIS — T796XXD Traumatic ischemia of muscle, subsequent encounter: Secondary | ICD-10-CM | POA: Diagnosis not present

## 2022-07-17 DIAGNOSIS — G9341 Metabolic encephalopathy: Secondary | ICD-10-CM | POA: Diagnosis not present

## 2022-07-17 DIAGNOSIS — I959 Hypotension, unspecified: Secondary | ICD-10-CM | POA: Diagnosis not present

## 2022-07-17 DIAGNOSIS — R197 Diarrhea, unspecified: Secondary | ICD-10-CM | POA: Diagnosis not present

## 2022-07-17 DIAGNOSIS — E860 Dehydration: Secondary | ICD-10-CM | POA: Diagnosis not present

## 2022-07-17 DIAGNOSIS — E039 Hypothyroidism, unspecified: Secondary | ICD-10-CM | POA: Diagnosis not present

## 2022-07-18 DIAGNOSIS — N1832 Chronic kidney disease, stage 3b: Secondary | ICD-10-CM | POA: Diagnosis not present

## 2022-07-18 DIAGNOSIS — E039 Hypothyroidism, unspecified: Secondary | ICD-10-CM | POA: Diagnosis not present

## 2022-07-18 DIAGNOSIS — K219 Gastro-esophageal reflux disease without esophagitis: Secondary | ICD-10-CM | POA: Diagnosis not present

## 2022-07-18 DIAGNOSIS — E559 Vitamin D deficiency, unspecified: Secondary | ICD-10-CM | POA: Diagnosis not present

## 2022-07-18 DIAGNOSIS — I5032 Chronic diastolic (congestive) heart failure: Secondary | ICD-10-CM | POA: Diagnosis not present

## 2022-07-18 DIAGNOSIS — I129 Hypertensive chronic kidney disease with stage 1 through stage 4 chronic kidney disease, or unspecified chronic kidney disease: Secondary | ICD-10-CM | POA: Diagnosis not present

## 2022-07-18 DIAGNOSIS — E876 Hypokalemia: Secondary | ICD-10-CM | POA: Diagnosis not present

## 2022-07-18 DIAGNOSIS — I700 Atherosclerosis of aorta: Secondary | ICD-10-CM | POA: Diagnosis not present

## 2022-07-18 DIAGNOSIS — Z79899 Other long term (current) drug therapy: Secondary | ICD-10-CM | POA: Diagnosis not present

## 2022-07-22 DIAGNOSIS — N1832 Chronic kidney disease, stage 3b: Secondary | ICD-10-CM | POA: Diagnosis not present

## 2022-07-22 DIAGNOSIS — Z79899 Other long term (current) drug therapy: Secondary | ICD-10-CM | POA: Diagnosis not present

## 2022-07-22 DIAGNOSIS — E876 Hypokalemia: Secondary | ICD-10-CM | POA: Diagnosis not present

## 2022-08-01 DIAGNOSIS — Z79899 Other long term (current) drug therapy: Secondary | ICD-10-CM | POA: Diagnosis not present

## 2022-08-01 DIAGNOSIS — N1832 Chronic kidney disease, stage 3b: Secondary | ICD-10-CM | POA: Diagnosis not present

## 2022-08-01 DIAGNOSIS — E039 Hypothyroidism, unspecified: Secondary | ICD-10-CM | POA: Diagnosis not present

## 2022-08-06 DIAGNOSIS — Z79899 Other long term (current) drug therapy: Secondary | ICD-10-CM | POA: Diagnosis not present

## 2022-08-06 DIAGNOSIS — R600 Localized edema: Secondary | ICD-10-CM | POA: Diagnosis not present

## 2022-08-06 DIAGNOSIS — I890 Lymphedema, not elsewhere classified: Secondary | ICD-10-CM | POA: Diagnosis not present

## 2022-08-07 DIAGNOSIS — M170 Bilateral primary osteoarthritis of knee: Secondary | ICD-10-CM | POA: Diagnosis not present

## 2022-08-13 DIAGNOSIS — Z79899 Other long term (current) drug therapy: Secondary | ICD-10-CM | POA: Diagnosis not present

## 2022-08-13 DIAGNOSIS — I890 Lymphedema, not elsewhere classified: Secondary | ICD-10-CM | POA: Diagnosis not present

## 2022-08-13 DIAGNOSIS — R600 Localized edema: Secondary | ICD-10-CM | POA: Diagnosis not present

## 2022-08-14 DIAGNOSIS — T796XXD Traumatic ischemia of muscle, subsequent encounter: Secondary | ICD-10-CM | POA: Diagnosis not present

## 2022-08-14 DIAGNOSIS — I890 Lymphedema, not elsewhere classified: Secondary | ICD-10-CM | POA: Diagnosis not present

## 2022-08-14 DIAGNOSIS — E559 Vitamin D deficiency, unspecified: Secondary | ICD-10-CM | POA: Diagnosis not present

## 2022-08-14 DIAGNOSIS — F411 Generalized anxiety disorder: Secondary | ICD-10-CM | POA: Diagnosis not present

## 2022-08-14 DIAGNOSIS — E039 Hypothyroidism, unspecified: Secondary | ICD-10-CM | POA: Diagnosis not present

## 2022-08-14 DIAGNOSIS — I700 Atherosclerosis of aorta: Secondary | ICD-10-CM | POA: Diagnosis not present

## 2022-08-14 DIAGNOSIS — N1832 Chronic kidney disease, stage 3b: Secondary | ICD-10-CM | POA: Diagnosis not present

## 2022-08-14 DIAGNOSIS — K219 Gastro-esophageal reflux disease without esophagitis: Secondary | ICD-10-CM | POA: Diagnosis not present

## 2022-08-22 DIAGNOSIS — E039 Hypothyroidism, unspecified: Secondary | ICD-10-CM | POA: Diagnosis not present

## 2022-08-22 DIAGNOSIS — R600 Localized edema: Secondary | ICD-10-CM | POA: Diagnosis not present

## 2022-08-22 DIAGNOSIS — K219 Gastro-esophageal reflux disease without esophagitis: Secondary | ICD-10-CM | POA: Diagnosis not present

## 2022-08-22 DIAGNOSIS — I700 Atherosclerosis of aorta: Secondary | ICD-10-CM | POA: Diagnosis not present

## 2022-08-22 DIAGNOSIS — G9341 Metabolic encephalopathy: Secondary | ICD-10-CM | POA: Diagnosis not present

## 2022-08-22 DIAGNOSIS — Z79899 Other long term (current) drug therapy: Secondary | ICD-10-CM | POA: Diagnosis not present

## 2022-08-22 DIAGNOSIS — F411 Generalized anxiety disorder: Secondary | ICD-10-CM | POA: Diagnosis not present

## 2022-08-22 DIAGNOSIS — I890 Lymphedema, not elsewhere classified: Secondary | ICD-10-CM | POA: Diagnosis not present

## 2022-08-22 DIAGNOSIS — R197 Diarrhea, unspecified: Secondary | ICD-10-CM | POA: Diagnosis not present

## 2022-08-22 DIAGNOSIS — E876 Hypokalemia: Secondary | ICD-10-CM | POA: Diagnosis not present

## 2022-08-22 DIAGNOSIS — E860 Dehydration: Secondary | ICD-10-CM | POA: Diagnosis not present

## 2022-08-22 DIAGNOSIS — N1832 Chronic kidney disease, stage 3b: Secondary | ICD-10-CM | POA: Diagnosis not present

## 2022-08-29 ENCOUNTER — Inpatient Hospital Stay (HOSPITAL_COMMUNITY)
Admission: EM | Admit: 2022-08-29 | Discharge: 2022-09-04 | DRG: 543 | Disposition: A | Discharge status: Skilled Nursing Facility | Payer: Medicare Other | Attending: Internal Medicine | Admitting: Internal Medicine

## 2022-08-29 ENCOUNTER — Other Ambulatory Visit: Payer: Self-pay

## 2022-08-29 ENCOUNTER — Ambulatory Visit: Payer: Medicare Other | Admitting: Nurse Practitioner

## 2022-08-29 ENCOUNTER — Emergency Department (HOSPITAL_COMMUNITY): Payer: Medicare Other

## 2022-08-29 ENCOUNTER — Encounter (HOSPITAL_COMMUNITY): Payer: Self-pay | Admitting: Emergency Medicine

## 2022-08-29 DIAGNOSIS — N1832 Chronic kidney disease, stage 3b: Secondary | ICD-10-CM | POA: Diagnosis present

## 2022-08-29 DIAGNOSIS — L8940 Pressure ulcer of contiguous site of back, buttock and hip, unspecified stage: Secondary | ICD-10-CM | POA: Diagnosis not present

## 2022-08-29 DIAGNOSIS — Z66 Do not resuscitate: Secondary | ICD-10-CM | POA: Diagnosis present

## 2022-08-29 DIAGNOSIS — F32A Depression, unspecified: Secondary | ICD-10-CM | POA: Diagnosis not present

## 2022-08-29 DIAGNOSIS — E46 Unspecified protein-calorie malnutrition: Secondary | ICD-10-CM | POA: Diagnosis not present

## 2022-08-29 DIAGNOSIS — E872 Acidosis, unspecified: Secondary | ICD-10-CM | POA: Diagnosis present

## 2022-08-29 DIAGNOSIS — M545 Low back pain, unspecified: Secondary | ICD-10-CM | POA: Insufficient documentation

## 2022-08-29 DIAGNOSIS — Z886 Allergy status to analgesic agent status: Secondary | ICD-10-CM | POA: Diagnosis not present

## 2022-08-29 DIAGNOSIS — M810 Age-related osteoporosis without current pathological fracture: Secondary | ICD-10-CM | POA: Diagnosis present

## 2022-08-29 DIAGNOSIS — Z882 Allergy status to sulfonamides status: Secondary | ICD-10-CM

## 2022-08-29 DIAGNOSIS — I129 Hypertensive chronic kidney disease with stage 1 through stage 4 chronic kidney disease, or unspecified chronic kidney disease: Secondary | ICD-10-CM | POA: Diagnosis present

## 2022-08-29 DIAGNOSIS — E782 Mixed hyperlipidemia: Secondary | ICD-10-CM | POA: Diagnosis not present

## 2022-08-29 DIAGNOSIS — R5381 Other malaise: Secondary | ICD-10-CM | POA: Diagnosis not present

## 2022-08-29 DIAGNOSIS — L89159 Pressure ulcer of sacral region, unspecified stage: Secondary | ICD-10-CM | POA: Diagnosis not present

## 2022-08-29 DIAGNOSIS — E440 Moderate protein-calorie malnutrition: Secondary | ICD-10-CM | POA: Diagnosis present

## 2022-08-29 DIAGNOSIS — M6282 Rhabdomyolysis: Secondary | ICD-10-CM | POA: Diagnosis present

## 2022-08-29 DIAGNOSIS — Z8249 Family history of ischemic heart disease and other diseases of the circulatory system: Secondary | ICD-10-CM

## 2022-08-29 DIAGNOSIS — L89150 Pressure ulcer of sacral region, unstageable: Secondary | ICD-10-CM | POA: Diagnosis present

## 2022-08-29 DIAGNOSIS — A0472 Enterocolitis due to Clostridium difficile, not specified as recurrent: Secondary | ICD-10-CM | POA: Diagnosis present

## 2022-08-29 DIAGNOSIS — L89622 Pressure ulcer of left heel, stage 2: Secondary | ICD-10-CM | POA: Diagnosis not present

## 2022-08-29 DIAGNOSIS — Z888 Allergy status to other drugs, medicaments and biological substances status: Secondary | ICD-10-CM

## 2022-08-29 DIAGNOSIS — R627 Adult failure to thrive: Secondary | ICD-10-CM | POA: Diagnosis present

## 2022-08-29 DIAGNOSIS — G2581 Restless legs syndrome: Secondary | ICD-10-CM | POA: Diagnosis present

## 2022-08-29 DIAGNOSIS — W19XXXA Unspecified fall, initial encounter: Secondary | ICD-10-CM

## 2022-08-29 DIAGNOSIS — R296 Repeated falls: Secondary | ICD-10-CM | POA: Diagnosis present

## 2022-08-29 DIAGNOSIS — Z818 Family history of other mental and behavioral disorders: Secondary | ICD-10-CM

## 2022-08-29 DIAGNOSIS — Z6827 Body mass index (BMI) 27.0-27.9, adult: Secondary | ICD-10-CM | POA: Diagnosis not present

## 2022-08-29 DIAGNOSIS — J449 Chronic obstructive pulmonary disease, unspecified: Secondary | ICD-10-CM | POA: Diagnosis present

## 2022-08-29 DIAGNOSIS — E860 Dehydration: Secondary | ICD-10-CM | POA: Diagnosis present

## 2022-08-29 DIAGNOSIS — Y92009 Unspecified place in unspecified non-institutional (private) residence as the place of occurrence of the external cause: Secondary | ICD-10-CM

## 2022-08-29 DIAGNOSIS — I1310 Hypertensive heart and chronic kidney disease without heart failure, with stage 1 through stage 4 chronic kidney disease, or unspecified chronic kidney disease: Secondary | ICD-10-CM | POA: Diagnosis not present

## 2022-08-29 DIAGNOSIS — Z881 Allergy status to other antibiotic agents status: Secondary | ICD-10-CM

## 2022-08-29 DIAGNOSIS — Z880 Allergy status to penicillin: Secondary | ICD-10-CM

## 2022-08-29 DIAGNOSIS — R748 Abnormal levels of other serum enzymes: Secondary | ICD-10-CM | POA: Diagnosis not present

## 2022-08-29 DIAGNOSIS — Z7401 Bed confinement status: Secondary | ICD-10-CM | POA: Diagnosis not present

## 2022-08-29 DIAGNOSIS — E876 Hypokalemia: Secondary | ICD-10-CM | POA: Diagnosis present

## 2022-08-29 DIAGNOSIS — Z91040 Latex allergy status: Secondary | ICD-10-CM

## 2022-08-29 DIAGNOSIS — S0990XA Unspecified injury of head, initial encounter: Secondary | ICD-10-CM | POA: Diagnosis not present

## 2022-08-29 DIAGNOSIS — Z809 Family history of malignant neoplasm, unspecified: Secondary | ICD-10-CM

## 2022-08-29 DIAGNOSIS — R52 Pain, unspecified: Secondary | ICD-10-CM | POA: Diagnosis not present

## 2022-08-29 DIAGNOSIS — R531 Weakness: Secondary | ICD-10-CM | POA: Diagnosis not present

## 2022-08-29 DIAGNOSIS — Z79899 Other long term (current) drug therapy: Secondary | ICD-10-CM

## 2022-08-29 DIAGNOSIS — L89626 Pressure-induced deep tissue damage of left heel: Secondary | ICD-10-CM | POA: Diagnosis present

## 2022-08-29 DIAGNOSIS — M4856XA Collapsed vertebra, not elsewhere classified, lumbar region, initial encounter for fracture: Principal | ICD-10-CM | POA: Diagnosis present

## 2022-08-29 DIAGNOSIS — Z981 Arthrodesis status: Secondary | ICD-10-CM

## 2022-08-29 DIAGNOSIS — Z79890 Hormone replacement therapy (postmenopausal): Secondary | ICD-10-CM

## 2022-08-29 DIAGNOSIS — K219 Gastro-esophageal reflux disease without esophagitis: Secondary | ICD-10-CM | POA: Insufficient documentation

## 2022-08-29 DIAGNOSIS — E162 Hypoglycemia, unspecified: Secondary | ICD-10-CM | POA: Diagnosis present

## 2022-08-29 DIAGNOSIS — Z043 Encounter for examination and observation following other accident: Secondary | ICD-10-CM | POA: Diagnosis not present

## 2022-08-29 DIAGNOSIS — E8809 Other disorders of plasma-protein metabolism, not elsewhere classified: Secondary | ICD-10-CM

## 2022-08-29 DIAGNOSIS — T796XXD Traumatic ischemia of muscle, subsequent encounter: Secondary | ICD-10-CM | POA: Diagnosis not present

## 2022-08-29 DIAGNOSIS — E039 Hypothyroidism, unspecified: Secondary | ICD-10-CM | POA: Diagnosis present

## 2022-08-29 DIAGNOSIS — Z833 Family history of diabetes mellitus: Secondary | ICD-10-CM

## 2022-08-29 DIAGNOSIS — M47816 Spondylosis without myelopathy or radiculopathy, lumbar region: Secondary | ICD-10-CM | POA: Diagnosis not present

## 2022-08-29 DIAGNOSIS — Z8261 Family history of arthritis: Secondary | ICD-10-CM

## 2022-08-29 DIAGNOSIS — N3281 Overactive bladder: Secondary | ICD-10-CM | POA: Diagnosis present

## 2022-08-29 DIAGNOSIS — L8990 Pressure ulcer of unspecified site, unspecified stage: Secondary | ICD-10-CM | POA: Insufficient documentation

## 2022-08-29 LAB — CBC WITH DIFFERENTIAL/PLATELET
Abs Immature Granulocytes: 0.03 10*3/uL (ref 0.00–0.07)
Basophils Absolute: 0 10*3/uL (ref 0.0–0.1)
Basophils Relative: 0 %
Eosinophils Absolute: 0 10*3/uL (ref 0.0–0.5)
Eosinophils Relative: 0 %
HCT: 36.1 % (ref 36.0–46.0)
Hemoglobin: 11.6 g/dL — ABNORMAL LOW (ref 12.0–15.0)
Immature Granulocytes: 0 %
Lymphocytes Relative: 8 %
Lymphs Abs: 0.8 10*3/uL (ref 0.7–4.0)
MCH: 31.8 pg (ref 26.0–34.0)
MCHC: 32.1 g/dL (ref 30.0–36.0)
MCV: 98.9 fL (ref 80.0–100.0)
Monocytes Absolute: 1 10*3/uL (ref 0.1–1.0)
Monocytes Relative: 9 %
Neutro Abs: 8.9 10*3/uL — ABNORMAL HIGH (ref 1.7–7.7)
Neutrophils Relative %: 83 %
Platelets: 345 10*3/uL (ref 150–400)
RBC: 3.65 MIL/uL — ABNORMAL LOW (ref 3.87–5.11)
RDW: 14.2 % (ref 11.5–15.5)
WBC: 10.8 10*3/uL — ABNORMAL HIGH (ref 4.0–10.5)
nRBC: 0 % (ref 0.0–0.2)

## 2022-08-29 LAB — BLOOD GAS, ARTERIAL
Acid-base deficit: 7.5 mmol/L — ABNORMAL HIGH (ref 0.0–2.0)
Bicarbonate: 18.1 mmol/L — ABNORMAL LOW (ref 20.0–28.0)
Drawn by: 38235
FIO2: 21 %
O2 Saturation: 98.5 %
Patient temperature: 37
pCO2 arterial: 36 mmHg (ref 32–48)
pH, Arterial: 7.31 — ABNORMAL LOW (ref 7.35–7.45)
pO2, Arterial: 93 mmHg (ref 83–108)

## 2022-08-29 LAB — COMPREHENSIVE METABOLIC PANEL
ALT: 32 U/L (ref 0–44)
AST: 38 U/L (ref 15–41)
Albumin: 3.3 g/dL — ABNORMAL LOW (ref 3.5–5.0)
Alkaline Phosphatase: 141 U/L — ABNORMAL HIGH (ref 38–126)
Anion gap: 8 (ref 5–15)
BUN: 35 mg/dL — ABNORMAL HIGH (ref 8–23)
CO2: 18 mmol/L — ABNORMAL LOW (ref 22–32)
Calcium: 8.8 mg/dL — ABNORMAL LOW (ref 8.9–10.3)
Chloride: 109 mmol/L (ref 98–111)
Creatinine, Ser: 1.6 mg/dL — ABNORMAL HIGH (ref 0.44–1.00)
GFR, Estimated: 33 mL/min — ABNORMAL LOW (ref >60)
Glucose, Bld: 86 mg/dL (ref 70–99)
Potassium: 4.5 mmol/L (ref 3.5–5.1)
Sodium: 135 mmol/L (ref 135–145)
Total Bilirubin: 1 mg/dL (ref 0.3–1.2)
Total Protein: 6.4 g/dL — ABNORMAL LOW (ref 6.5–8.1)

## 2022-08-29 LAB — CK: Total CK: 408 U/L — ABNORMAL HIGH (ref 38–234)

## 2022-08-29 MED ORDER — SODIUM CHLORIDE 0.9 % IV BOLUS
1000.0000 mL | Freq: Once | INTRAVENOUS | Status: AC
  Administered 2022-08-29: 1000 mL via INTRAVENOUS

## 2022-08-29 MED ORDER — ACETAMINOPHEN 650 MG RE SUPP: 650.0000 mg | Freq: Four times a day (QID) | RECTAL | Status: DC | PRN

## 2022-08-29 MED ORDER — ROPINIROLE HCL 1 MG PO TABS
3.0000 mg | ORAL_TABLET | Freq: Every day | ORAL | Status: DC
  Administered 2022-08-29 – 2022-09-03 (×6): 3 mg via ORAL
  Filled 2022-08-29 (×6): qty 3

## 2022-08-29 MED ORDER — CHOLESTYRAMINE LIGHT 4 G PO PACK
4.0000 g | PACK | Freq: Two times a day (BID) | ORAL | Status: DC
  Administered 2022-08-30 – 2022-09-03 (×10): 4 g via ORAL
  Filled 2022-08-29 (×15): qty 1

## 2022-08-29 MED ORDER — ONDANSETRON HCL 4 MG/2ML IJ SOLN: 4.0000 mg | Freq: Four times a day (QID) | INTRAMUSCULAR | Status: DC | PRN

## 2022-08-29 MED ORDER — OXYCODONE-ACETAMINOPHEN 5-325 MG PO TABS
1.0000 | ORAL_TABLET | Freq: Once | ORAL | Status: AC
  Administered 2022-08-29: 1 via ORAL
  Filled 2022-08-29: qty 1

## 2022-08-29 MED ORDER — CHOLESTYRAMINE 4 G PO PACK
4.0000 g | PACK | Freq: Two times a day (BID) | ORAL | Status: DC
  Filled 2022-08-29 (×2): qty 1

## 2022-08-29 MED ORDER — SODIUM CHLORIDE 0.9 % IV SOLN: INTRAVENOUS | Status: DC

## 2022-08-29 MED ORDER — OXYCODONE-ACETAMINOPHEN 5-325 MG PO TABS
1.0000 | ORAL_TABLET | ORAL | Status: DC | PRN
  Administered 2022-08-30 – 2022-08-31 (×2): 1 via ORAL
  Filled 2022-08-29 (×2): qty 1

## 2022-08-29 MED ORDER — ROPINIROLE HCL 1 MG PO TABS
3.0000 mg | ORAL_TABLET | Freq: Once | ORAL | Status: AC
  Administered 2022-08-29: 3 mg via ORAL
  Filled 2022-08-29: qty 3

## 2022-08-29 MED ORDER — LOPERAMIDE HCL 2 MG PO CAPS
2.0000 mg | ORAL_CAPSULE | ORAL | Status: DC | PRN
  Administered 2022-09-02: 2 mg via ORAL
  Filled 2022-08-29: qty 1

## 2022-08-29 MED ORDER — PANTOPRAZOLE SODIUM 40 MG PO TBEC
40.0000 mg | DELAYED_RELEASE_TABLET | Freq: Every morning | ORAL | Status: DC
  Administered 2022-08-30 – 2022-09-04 (×6): 40 mg via ORAL
  Filled 2022-08-29 (×6): qty 1

## 2022-08-29 MED ORDER — ENSURE ENLIVE PO LIQD
237.0000 mL | Freq: Two times a day (BID) | ORAL | Status: DC
  Administered 2022-08-30 – 2022-09-01 (×4): 237 mL via ORAL
  Filled 2022-08-29 (×2): qty 237

## 2022-08-29 MED ORDER — ENOXAPARIN SODIUM 30 MG/0.3ML IJ SOSY
30.0000 mg | PREFILLED_SYRINGE | INTRAMUSCULAR | Status: DC
  Administered 2022-08-29 – 2022-09-02 (×5): 30 mg via SUBCUTANEOUS
  Filled 2022-08-29 (×5): qty 0.3

## 2022-08-29 MED ORDER — LEVOTHYROXINE SODIUM 75 MCG PO TABS
75.0000 ug | ORAL_TABLET | Freq: Every day | ORAL | Status: DC
  Administered 2022-08-30 – 2022-09-04 (×6): 75 ug via ORAL
  Filled 2022-08-29 (×6): qty 1

## 2022-08-29 MED ORDER — ONDANSETRON HCL 4 MG PO TABS: 4.0000 mg | ORAL_TABLET | Freq: Four times a day (QID) | ORAL | Status: DC | PRN

## 2022-08-29 MED ORDER — ACETAMINOPHEN 325 MG PO TABS
650.0000 mg | ORAL_TABLET | Freq: Four times a day (QID) | ORAL | Status: DC | PRN
  Administered 2022-08-31 – 2022-09-04 (×6): 650 mg via ORAL
  Filled 2022-08-29 (×6): qty 2

## 2022-08-29 NOTE — ED Provider Notes (Addendum)
[facility] Provider Note   CSN: [id number] Arrival date & time: 08/29/22  1359     History  Chief Complaint  Patient presents with   [name]    Prescilla Saunders is a 77 y.o. female.  Patient reports that she fell at home last p.m.  Patient reports her care provider was unable to get her off the floor.  Patient's son was able to get her up today and bring her to the emergency department.  Patient complains of pain in her low back and pelvic area.  Patient reports that she has restless leg syndrome and is currently having discomfort in both of her legs.  Patient's son reports patient's back and bottom are red and swollen.  Patient was recently discharged from a nursing rehab facility to home.  The history is provided by the patient. No language interpreter was used.  Fall This is a new problem. The problem occurs constantly. The problem has not changed since onset.Pertinent negatives include no chest pain and no abdominal pain. Nothing relieves the symptoms. She has tried nothing for the symptoms.       Home Medications Prior to Admission medications   Medication Sig Start Date End Date Taking? Authorizing Provider  cholestyramine (QUESTRAN) 4 g packet Take 1 packet (4 g total) by mouth 3 (three) times daily with meals. 05/30/22   Hassell Done, Mary-Margaret, FNP  citalopram (CELEXA) 40 MG tablet Take 1 tablet (40 mg total) by mouth daily. 05/06/22   Hassell Done, Mary-Margaret, FNP  dicyclomine (BENTYL) 10 MG capsule TAKE ONE CAPSULE BY MOUTH THREE TIMES DAILY BEFORE meals 06/19/22   Hassell Done, Mary-Margaret, FNP  feeding supplement (ENSURE ENLIVE / ENSURE PLUS) LIQD Take 237 mLs by mouth 2 (two) times daily between meals. 07/10/22   Manuella Ghazi, Pratik D, DO  levothyroxine (SYNTHROID) 75 MCG tablet Take 1 tablet (75 mcg total) by mouth daily. 05/07/22   Hassell Done, Mary-Margaret, FNP  loperamide (IMODIUM) 2 MG capsule Take 1 capsule (2 mg total) by mouth as needed for  diarrhea or loose stools. 02/04/22   Patrecia Pour, MD  pantoprazole (PROTONIX) 40 MG tablet Take 40 mg by mouth every morning. 06/18/22   [provider]  potassium chloride (KLOR-CON M) 10 MEQ tablet Take 1 tablet (10 mEq total) by mouth 2 (two) times daily. 05/07/22   Hassell Done, Mary-Margaret, FNP  rOPINIRole (REQUIP) 3 MG tablet Take 1 tablet (3 mg total) by mouth at bedtime. 05/06/22   Hassell Done Mary-Margaret, FNP      Allergies    Celebrex [celecoxib], Keflet [cephalexin], Penicillins, Sulfa antibiotics, Kenalog [triamcinolone acetonide], Latex, Lisinopril, Mobic [meloxicam], and Penicillin g    Review of Systems   Review of Systems  Cardiovascular:  Negative for chest pain.  Gastrointestinal:  Negative for abdominal pain.  All other systems reviewed and are negative.   Physical Exam Updated Vital Signs BP 131/79   Pulse 76   Temp 98 F (36.7 C) (Oral)   Resp 14   Ht 5\' 2"  (1.575 m)   Wt 72.6 kg   SpO2 98%   BMI 29.27 kg/m  Physical Exam Vitals and nursing note reviewed.  Constitutional:      Appearance: She is well-developed.  HENT:     Head: Normocephalic.  Cardiovascular:     Rate and Rhythm: Normal rate and regular rhythm.  Pulmonary:     Effort: Pulmonary effort is normal.  Abdominal:     General: Abdomen is flat. There is no distension.  Musculoskeletal:        General: Normal range of motion.     Cervical back: Normal range of motion.     Comments: 10 cm dark area sacral, appears chronic, full buttocks erythematous  Skin:    General: Skin is warm.  Neurological:     General: No focal deficit present.     Mental Status: She is alert and oriented to person, place, and time.  Psychiatric:        Mood and Affect: Mood normal.     ED Results / Procedures / Treatments   Labs (all labs ordered are listed, but only abnormal results are displayed) Labs Reviewed  CBC WITH DIFFERENTIAL/PLATELET - Abnormal; Notable for the following components:       Result Value   WBC 10.8 (*)    RBC 3.65 (*)    Hemoglobin 11.6 (*)    Neutro Abs 8.9 (*)    All other components within normal limits  COMPREHENSIVE METABOLIC PANEL - Abnormal; Notable for the following components:   CO2 18 (*)    BUN 35 (*)    Creatinine, Ser 1.60 (*)    Calcium 8.8 (*)    Total Protein 6.4 (*)    Albumin 3.3 (*)    Alkaline Phosphatase 141 (*)    GFR, Estimated 33 (*)    All other components within normal limits  CK - Abnormal; Notable for the following components:   Total CK 408 (*)    All other components within normal limits  URINALYSIS, ROUTINE W REFLEX MICROSCOPIC    EKG None  Radiology CT Lumbar Spine Wo Contrast  Result Date: 08/29/2022 CLINICAL DATA:  Patient fell last night at home found by brother on floor today. Lower back pain. EXAM: CT LUMBAR SPINE WITHOUT CONTRAST TECHNIQUE: Multidetector CT imaging of the lumbar spine was performed without intravenous contrast administration. Multiplanar CT image reconstructions were also generated. RADIATION DOSE REDUCTION: This exam was performed according to the departmental dose-optimization program which includes automated exposure control, adjustment of the mA and/or kV according to patient size and/or use of iterative reconstruction technique. COMPARISON:  Radiographs 08/29/2022 and CT abdomen and pelvis 01/09/2022 FINDINGS: Segmentation: 5 lumbar type vertebrae. Alignment: Grade 1 anterolisthesis L4. No evidence of traumatic malalignment. Vertebrae: No acute fracture or focal pathologic process. The L1 vertebral body height loss seen on radiograph earlier today was likely artifactual due to overlapping structures as the L1 vertebral body demonstrates normal height and appears similar to 01/09/2022. Paraspinal and other soft tissues: No acute abnormality. Aortic calcification. Disc levels: Posterior fusion L4-S1 with interbody spacers at L4-L5 and L5-S1. Multilevel spondylosis, disc space height loss, and  degenerative endplate changes greatest at T12-L1 where it is moderate. Lower lumbar facet arthropathy. Streak artifact from the lumbar fusion degrades evaluation of the spinal canal and neural foramina at L4-S1. No high-grade spinal canal narrowing. IMPRESSION: No acute fracture or evidence of traumatic malalignment in the lumbar spine. Electronically Signed   By: Placido Sou M.D.   On: 08/29/2022 18:56   CT Head Wo Contrast  Result Date: 08/29/2022 CLINICAL DATA:  Fall.  Head trauma. EXAM: CT HEAD WITHOUT CONTRAST TECHNIQUE: Contiguous axial images were obtained from the base of the skull through the vertex without intravenous contrast. RADIATION DOSE REDUCTION: This exam was performed according to the departmental dose-optimization program which includes automated exposure control, adjustment of the mA and/or kV according to patient size and/or use of iterative reconstruction technique. COMPARISON:  Head CT 07/06/2022. FINDINGS:  Brain: No acute hemorrhage. Unchanged chronic small-vessel disease. Cortical gray-white differentiation is otherwise preserved. Prominence of the ventricles and sulci within normal limits for age. No extra-axial collection. Basilar cisterns are patent. Vascular: No hyperdense vessel or unexpected calcification. Skull: No calvarial fracture or suspicious bone lesion. Skull base is unremarkable. Sinuses/Orbits: Mucous retention cysts in the left maxillary sinus. Orbits are unremarkable. Mastoids are well aerated. Other: None. IMPRESSION: No acute intracranial abnormality. Electronically Signed   By: Emmit Alexanders M.D.   On: 08/29/2022 15:42   DG Thoracic Spine 2 View  Result Date: 08/29/2022 CLINICAL DATA:  Fall. EXAM: THORACIC SPINE 2 VIEWS COMPARISON:  Chest radiograph 01/22/2022. FINDINGS: Two views. There is no evidence of thoracic spine fracture. Alignment is normal. Multilevel degenerative changes with calcified discs and flowing anterior osteophytes. IMPRESSION: No  evidence of thoracic spine fracture or traumatic malalignment. Electronically Signed   By: Emmit Alexanders M.D.   On: 08/29/2022 15:29   DG Lumbar Spine Complete  Result Date: 08/29/2022 CLINICAL DATA:  Fall. EXAM: LUMBAR SPINE - COMPLETE 4+ VIEW COMPARISON:  MRI lumbar spine 12/27/2019. FINDINGS: Five views. Postoperative changes of L4-S1 posterior spinal and interbody fusion. Hardware is intact. New mild compression deformity of the L1 vertebral body. Unchanged degenerative endplate sclerosis along the superior L2 endplate. IMPRESSION: New mild compression deformity of the L1 vertebral body. Electronically Signed   By: Emmit Alexanders M.D.   On: 08/29/2022 15:26   DG Pelvis 1-2 Views  Result Date: 08/29/2022 CLINICAL DATA:  [place] last night.  Found down. EXAM: PELVIS - 1-2 VIEW COMPARISON:  Abdominal film 01/29/2022 FINDINGS: Both hips are normally located. No acute hip fracture. The pubic symphysis and SI joints are intact. No definite pelvic fractures. Lumbosacral fusion hardware noted. IMPRESSION: No acute bony findings. Electronically Signed   By: Marijo Sanes M.D.   On: 08/29/2022 15:26    Procedures Procedures    Medications Ordered in ED Medications  sodium chloride 0.9 % bolus 1,000 mL (1,000 mLs Intravenous New Bag/Given 08/29/22 1905)  rOPINIRole (REQUIP) tablet 3 mg (3 mg Oral Given 08/29/22 1848)  oxyCODONE-acetaminophen (PERCOCET/ROXICET) 5-325 MG per tablet 1 tablet (1 tablet Oral Given 08/29/22 1848)    ED Course/ Medical Decision Making/ A&P                             Medical Decision Making Patient reports she fell last p.m. and she was unable to get up.  Patient's son was able to get her up today  Amount and/or Complexity of Data Reviewed Independent Historian: caregiver    Details: Patient's son states patient's bottom is very red from fall External Data Reviewed: notes.    Details: This notes reviewed patient was hospitalized discharged from here to go to a rehab  facility. Labs: ordered. Decision-making details documented in ED Course.    Details: Labs ordered reviewed and interpreted patient is white blood cell count of 10.8 BUN is 35 creatinine is 1.60 patient has a total CK of 408 Radiology: ordered.    Details: X-ray LS spine showed a possible L1 vertebral body compression fracture.  CT LS spine shows no evidence of fracture. Discussion of management or test interpretation with external provider(s): Hospitalist is consulted for admission  Risk Prescription drug management. Decision regarding hospitalization. Risk Details: Patient given IV fluids x 1 L.  Patient is given Percocet 1 tablet.  Patient is given a dose of her Requip to help with  her restless leg.                  Final Clinical Impression(s) / ED Diagnoses Final diagnoses:  Fall, initial encounter  Pressure injury of skin of sacral region, unspecified injury stage  Dehydration    Rx / DC Orders ED Discharge Orders     None         Osie Cheeks 08/29/22 1946    Osie Cheeks 08/29/22 1959    Rondel Baton, MD 08/31/22 1626

## 2022-08-29 NOTE — ED Triage Notes (Signed)
Pt fell last night at home. Her brother states he found her on the floor today. Pt c/o lower back pain.

## 2022-08-29 NOTE — ED Provider Triage Note (Signed)
Emergency Medicine Provider Triage Evaluation Note  Ellyette Engblom , a 77 y.o. female  was evaluated in triage.  Pt complains of low back pain.  Pt fell at home and could not get up. Pt in the floor for 14-15 hours   Review of Systems  Positive: Low back pain Negative: fever  Physical Exam  BP (!) 145/84 (BP Location: Right Arm)   Pulse 71   Temp 98 F (36.7 C) (Oral)   Resp 16   Ht 5\' 2"  (1.575 m)   Wt 72.6 kg   SpO2 100%   BMI 29.27 kg/m  Gen:   Awake, no distress   Resp:  Normal effort  MSK:   Moves extremities without difficulty  Other:    Medical Decision Making  Medically screening exam initiated at 2:56 PM.  Appropriate orders placed.  Kylla Lewkowicz was informed that the remainder of the evaluation will be completed by another provider, this initial triage assessment does not replace that evaluation, and the importance of remaining in the ED until their evaluation is complete.     Fransico Meadow, [place] 08/29/22 1457

## 2022-08-29 NOTE — H&P (Addendum)
History and Physical    Patient: [name] [id number] DOB: August 29, 1945 DOA: 08/29/2022 DOS: the patient was seen and examined on 08/29/2022 PCP: Chevis Pretty, FNP  Patient coming from: Home  Chief Complaint:  Chief Complaint  Patient presents with   Fall   HPI: [name] is a 77 y.o. female with medical history significant of  hypothyroidism, GERD, RLS, CKD 3B (baseline creatinine of 1.7-1.9) who presents to the emergency department after sustaining a fall at home yesterday.  Patient was discharged 2 days ago from a nursing rehab facility to home after being admitted from 2/10 to 2/14 due to acute metabolic encephalopathy, rhabdomyolysis with dehydration, hypokalemia and diarrhea. Patient fell yesterday at night and was unable to get up from the floor, her care provider was unable to get her off the floor, so patient ended up spending the night on the floor, son was able to get off the floor today and she was brought to the ED for further evaluation and management.  She complained of low back and pelvic pain and also complained of discomfort in her legs due to history of restless leg syndrome.  Son reported that patient's bottom was swollen and red, she denies chest pain, shortness of breath, fever, chills, palpitation, nausea or vomiting.  ED Course:  In the emergency department, BP was 145/84 and all other vital signs were within normal range.  Workup in the ED showed normal CBC except for WBC of 10.8 and hemoglobin of 11.6.  BMP showed sodium 135, potassium 4.5, chloride 109, bicarb 18, blood glucose 86, BUN 35, creatinine 1.60, albumin 3.3,, phosphatase 141, total CK 408. Several imaging studies done including CT lumbar spine without contrast, CT head without contrast, chest x-ray, pelvic x-ray showed no fracture. Lumbar spine x-ray showed new mild compression deformity of the L1 vertebral body. Patient was treated with Percocet, ropinirole was given and IV hydration was  provided.  Hospitalist was asked to admit patient for further evaluation and management.  Review of Systems: Review of systems as noted in the HPI. All other systems reviewed and are negative.   Past Medical History:  Diagnosis Date   Allergy    Bradycardia    Bronchitis, chronic    Chronic anxiety    Complication of anesthesia    hard to wake up   COPD (chronic obstructive pulmonary disease)    Depression    Esophageal reflux    Headache(784.0)    Hyperlipidemia    Hypertension    Hypothyroidism    Obesity    Osteoporosis    Overactive bladder    PVC's (premature ventricular contractions)    Thyroid disease    Vitamin D deficiency disease    Past Surgical History:  Procedure Laterality Date   ABDOMINAL HYSTERECTOMY     CENTRAL LINE INSERTION Right 01/10/2022   Procedure: CENTRAL LINE INSERTION;  Surgeon: Virl Cagey, MD;  Location: [facility];  Service: General;  Laterality: Right;   CHOLECYSTECTOMY N/A 11/04/2013   Procedure: LAPAROSCOPIC CHOLECYSTECTOMY WITH INTRAOPERATIVE CHOLANGIOGRAM;  Surgeon: Imogene Burn. Georgette Dover, MD;  Location: [facility];  Service: General;  Laterality: N/A;   COLONOSCOPY     FRACTURE SURGERY     pins removed from Hip surgery   HIP FRACTURE SURGERY  1990    pins removed in [place] CV LINE LEFT  01/31/2022   IR US GUIDE VASC ACCESS LEFT  01/31/2022   LAPAROTOMY N/A 01/10/2022   Procedure: EXPLORATORY LAPAROTOMY,;  Surgeon: Constance Haw,  Lanell Matar, MD;  Location: [facility];  Service: General;  Laterality: N/A;   LYSIS OF ADHESION N/A 01/10/2022   Procedure: LYSIS OF ADHESION;  Surgeon: Virl Cagey, MD;  Location: [facility];  Service: General;  Laterality: N/A;   Marquez N/A 01/18/2022   Procedure: THORACENTESIS;  Surgeon: Candee Furbish, MD;  Location: [facility];  Service: Pulmonary;  Laterality: N/A;   UPPER GASTROINTESTINAL ENDOSCOPY      Social History:  reports that she has never  smoked. She has never used smokeless tobacco. She reports that she does not drink alcohol and does not use drugs.   Allergies  Allergen Reactions   Celebrex [Celecoxib] Swelling   Keflet [Cephalexin] Swelling   Penicillins Hives and Swelling    DID THE REACTION INVOLVE: Swelling of the face/tongue/throat, SOB, or low BP? Yes-swelling-hives Sudden or severe rash/hives, skin peeling, or the inside of the mouth or nose? Unknown Did it require medical treatment? Unknown When did it last happen?    over 10 years   If all above answers are "NO", may proceed with cephalosporin use.    Sulfa Antibiotics Swelling   Kenalog [Triamcinolone Acetonide]     unknown   Latex    Lisinopril Other (See Comments)    unknown   Mobic [Meloxicam]     SWELLING   Penicillin G Other (See Comments)    Family History  Problem Relation Age of Onset   Arthritis Other    Heart disease Other    Cancer Other    Diabetes Other    OCD Other      Prior to Admission medications   Medication Sig Start Date End Date Taking? Authorizing Provider  cholestyramine (QUESTRAN) 4 g packet Take 1 packet (4 g total) by mouth 3 (three) times daily with meals. 05/30/22   Hassell Done, Mary-Margaret, FNP  citalopram (CELEXA) 40 MG tablet Take 1 tablet (40 mg total) by mouth daily. 05/06/22   Hassell Done, Mary-Margaret, FNP  dicyclomine (BENTYL) 10 MG capsule TAKE ONE CAPSULE BY MOUTH THREE TIMES DAILY BEFORE meals 06/19/22   Hassell Done, Mary-Margaret, FNP  feeding supplement (ENSURE ENLIVE / ENSURE PLUS) LIQD Take 237 mLs by mouth 2 (two) times daily between meals. 07/10/22   Manuella Ghazi, Pratik D, DO  levothyroxine (SYNTHROID) 75 MCG tablet Take 1 tablet (75 mcg total) by mouth daily. 05/07/22   Hassell Done, Mary-Margaret, FNP  loperamide (IMODIUM) 2 MG capsule Take 1 capsule (2 mg total) by mouth as needed for diarrhea or loose stools. 02/04/22   Patrecia Pour, MD  pantoprazole (PROTONIX) 40 MG tablet Take 40 mg by mouth every morning. 06/18/22    [provider]  potassium chloride (KLOR-CON M) 10 MEQ tablet Take 1 tablet (10 mEq total) by mouth 2 (two) times daily. 05/07/22   Hassell Done, Mary-Margaret, FNP  rOPINIRole (REQUIP) 3 MG tablet Take 1 tablet (3 mg total) by mouth at bedtime. 05/06/22   Chevis Pretty, FNP    Physical Exam: BP 107/61   Pulse 75   Temp 98.1 F (36.7 C)   Resp 12   Ht 5\' 2"  (1.575 m)   Wt 72.6 kg   SpO2 99%   BMI 29.27 kg/m   General: 77 y.o. year-old female somnolent, but easily arousable and in no acute distress.   HEENT: NCAT, EOMI, dry mucous membrane. Neck: Supple, trachea medial Cardiovascular: Regular rate and rhythm with no rubs or gallops.  No thyromegaly  or JVD noted.  No lower extremity edema. 2/4 pulses in all 4 extremities. Respiratory: Clear to auscultation with no wheezes or rales. Good inspiratory effort. Abdomen: Soft, nontender nondistended with normal bowel sounds x4 quadrants. Muskuloskeletal: Venous stasis noted in right lower extremity, left lower extremity noted with an Ace bandage.   Neuro: CN II-XII intact, finger-to-nose test intact, strength 5/5 x 4, sensation, reflexes intact Skin: Buttocks with erythema and decubitus ulcer ( Pictures below) Psychiatry: Judgement and insight appear normal. Mood is appropriate for condition and setting                   Labs on Admission:  Basic Metabolic Panel: Recent Labs  Lab 08/29/22 1548  NA 135  K 4.5  CL 109  CO2 18*  GLUCOSE 86  BUN 35*  CREATININE 1.60*  CALCIUM 8.8*   Liver Function Tests: Recent Labs  Lab 08/29/22 1548  AST 38  ALT 32  ALKPHOS 141*  BILITOT 1.0  PROT 6.4*  ALBUMIN 3.3*   No results for input(s): "LIPASE", "AMYLASE" in the last 168 hours. No results for input(s): "AMMONIA" in the last 168 hours. CBC: Recent Labs  Lab 08/29/22 1548  WBC 10.8*  NEUTROABS 8.9*  HGB 11.6*  HCT 36.1  MCV 98.9  PLT 345   Cardiac Enzymes: Recent Labs  Lab 08/29/22 1548  CKTOTAL  408*    BNP (last 3 results) Recent Labs    07/06/22 1850  BNP 264.0*    ProBNP (last 3 results) No results for input(s): "PROBNP" in the last 8760 hours.  CBG: No results for input(s): "GLUCAP" in the last 168 hours.  Radiological Exams on Admission: CT Lumbar Spine Wo Contrast  Result Date: 08/29/2022 CLINICAL DATA:  Patient fell last night at home found by brother on floor today. Lower back pain. EXAM: CT LUMBAR SPINE WITHOUT CONTRAST TECHNIQUE: Multidetector CT imaging of the lumbar spine was performed without intravenous contrast administration. Multiplanar CT image reconstructions were also generated. RADIATION DOSE REDUCTION: This exam was performed according to the departmental dose-optimization program which includes automated exposure control, adjustment of the mA and/or kV according to patient size and/or use of iterative reconstruction technique. COMPARISON:  Radiographs 08/29/2022 and CT abdomen and pelvis 01/09/2022 FINDINGS: Segmentation: 5 lumbar type vertebrae. Alignment: Grade 1 anterolisthesis L4. No evidence of traumatic malalignment. Vertebrae: No acute fracture or focal pathologic process. The L1 vertebral body height loss seen on radiograph earlier today was likely artifactual due to overlapping structures as the L1 vertebral body demonstrates normal height and appears similar to 01/09/2022. Paraspinal and other soft tissues: No acute abnormality. Aortic calcification. Disc levels: Posterior fusion L4-S1 with interbody spacers at L4-L5 and L5-S1. Multilevel spondylosis, disc space height loss, and degenerative endplate changes greatest at T12-L1 where it is moderate. Lower lumbar facet arthropathy. Streak artifact from the lumbar fusion degrades evaluation of the spinal canal and neural foramina at L4-S1. No high-grade spinal canal narrowing. IMPRESSION: No acute fracture or evidence of traumatic malalignment in the lumbar spine. Electronically Signed   By: Minerva Fester  M.D.   On: 08/29/2022 18:56   CT Head Wo Contrast  Result Date: 08/29/2022 CLINICAL DATA:  Fall.  Head trauma. EXAM: CT HEAD WITHOUT CONTRAST TECHNIQUE: Contiguous axial images were obtained from the base of the skull through the vertex without intravenous contrast. RADIATION DOSE REDUCTION: This exam was performed according to the departmental dose-optimization program which includes automated exposure control, adjustment of the mA and/or kV according to  patient size and/or use of iterative reconstruction technique. COMPARISON:  Head CT 07/06/2022. FINDINGS: Brain: No acute hemorrhage. Unchanged chronic small-vessel disease. Cortical gray-white differentiation is otherwise preserved. Prominence of the ventricles and sulci within normal limits for age. No extra-axial collection. Basilar cisterns are patent. Vascular: No hyperdense vessel or unexpected calcification. Skull: No calvarial fracture or suspicious bone lesion. Skull base is unremarkable. Sinuses/Orbits: Mucous retention cysts in the left maxillary sinus. Orbits are unremarkable. Mastoids are well aerated. Other: None. IMPRESSION: No acute intracranial abnormality. Electronically Signed   By: Orvan FalconerWalter  Wiggins M.D.   On: 08/29/2022 15:42   DG Thoracic Spine 2 View  Result Date: 08/29/2022 CLINICAL DATA:  Fall. EXAM: THORACIC SPINE 2 VIEWS COMPARISON:  Chest radiograph 01/22/2022. FINDINGS: Two views. There is no evidence of thoracic spine fracture. Alignment is normal. Multilevel degenerative changes with calcified discs and flowing anterior osteophytes. IMPRESSION: No evidence of thoracic spine fracture or traumatic malalignment. Electronically Signed   By: Orvan FalconerWalter  Wiggins M.D.   On: 08/29/2022 15:29   DG Lumbar Spine Complete  Result Date: 08/29/2022 CLINICAL DATA:  Fall. EXAM: LUMBAR SPINE - COMPLETE 4+ VIEW COMPARISON:  MRI lumbar spine 12/27/2019. FINDINGS: Five views. Postoperative changes of L4-S1 posterior spinal and interbody fusion.  Hardware is intact. New mild compression deformity of the L1 vertebral body. Unchanged degenerative endplate sclerosis along the superior L2 endplate. IMPRESSION: New mild compression deformity of the L1 vertebral body. Electronically Signed   By: Orvan FalconerWalter  Wiggins M.D.   On: 08/29/2022 15:26   DG Pelvis 1-2 Views  Result Date: 08/29/2022 CLINICAL DATA:  Larey SeatFell last night.  Found down. EXAM: PELVIS - 1-2 VIEW COMPARISON:  Abdominal film 01/29/2022 FINDINGS: Both hips are normally located. No acute hip fracture. The pubic symphysis and SI joints are intact. No definite pelvic fractures. Lumbosacral fusion hardware noted. IMPRESSION: No acute bony findings. Electronically Signed   By: Rudie MeyerP.  Gallerani M.D.   On: 08/29/2022 15:26    EKG: I independently viewed the EKG done and my findings are as followed: Normal sinus rhythm at a rate of 67 bpm  Assessment/Plan Present on Admission:  Dehydration  Acquired hypothyroidism  Mixed hyperlipidemia  Principal Problem:   Fall at home, initial encounter Active Problems:   Mixed hyperlipidemia   Acquired hypothyroidism   Dehydration   Low back pain   Failure to thrive in adult   Physical deconditioning   Elevated CK   Decubitus ulcer   GERD (gastroesophageal reflux disease)   Restless leg syndrome  Fall at home Low back pain in the setting of L1 compression deformity Failure to thrive in adult/deconditioning Continue fall precaution Continue Percocet as needed Continue PT/OT eval and treat Dietitian will be consulted TOC will be consulted  Elevated CK CK 408, continue IV hydration  Dehydration Continue IV hydration  Hypoalbuminemia possibly secondary to mild protein calorie malnutrition Albumin 3.3, protein supplement will be provided  Decubitus ulcer/pressure injury of skin Wound nurse will be consulted  ?? Diarrhea (? Chronic) Patient was reported to have 2-3 loose bowel movements in the ED, onset unknown at this time.  However, she  appears to have chronic diarrhea per discharge summary from last admission and home meds include Imodium. C.Diff and GI stool panel pending Continue home meds  Acquired hypothyroidism Continue Synthroid  GERD Continue Protonix  Mixed hyperlipidemia Continue cholestyramine  RLS Continue Requip  CKD 3B Stable, Renally adjust medications, avoid nephrotoxic agents/dehydration/hypotension   DVT prophylaxis: Lovenox  Code Status: DNR  Consults: None  Family Communication: None at bedside  Severity of Illness: The appropriate patient status for this patient is OBSERVATION. Observation status is judged to be reasonable and necessary in order to provide the required intensity of service to ensure the patient's safety. The patient's presenting symptoms, physical exam findings, and initial radiographic and laboratory data in the context of their medical condition is felt to place them at decreased risk for further clinical deterioration. Furthermore, it is anticipated that the patient will be medically stable for discharge from the hospital within 2 midnights of admission.   Author: Frankey Shown, DO 08/29/2022 8:47 PM  For on call review [email or web address].

## 2022-08-30 DIAGNOSIS — Y92009 Unspecified place in unspecified non-institutional (private) residence as the place of occurrence of the external cause: Secondary | ICD-10-CM | POA: Diagnosis not present

## 2022-08-30 DIAGNOSIS — M6282 Rhabdomyolysis: Secondary | ICD-10-CM | POA: Diagnosis present

## 2022-08-30 DIAGNOSIS — A0472 Enterocolitis due to Clostridium difficile, not specified as recurrent: Secondary | ICD-10-CM | POA: Diagnosis present

## 2022-08-30 DIAGNOSIS — R296 Repeated falls: Secondary | ICD-10-CM

## 2022-08-30 DIAGNOSIS — L8940 Pressure ulcer of contiguous site of back, buttock and hip, unspecified stage: Secondary | ICD-10-CM | POA: Diagnosis not present

## 2022-08-30 DIAGNOSIS — T796XXD Traumatic ischemia of muscle, subsequent encounter: Secondary | ICD-10-CM | POA: Diagnosis not present

## 2022-08-30 DIAGNOSIS — Z886 Allergy status to analgesic agent status: Secondary | ICD-10-CM | POA: Diagnosis not present

## 2022-08-30 DIAGNOSIS — R627 Adult failure to thrive: Secondary | ICD-10-CM | POA: Diagnosis present

## 2022-08-30 DIAGNOSIS — L89626 Pressure-induced deep tissue damage of left heel: Secondary | ICD-10-CM | POA: Diagnosis present

## 2022-08-30 DIAGNOSIS — R748 Abnormal levels of other serum enzymes: Secondary | ICD-10-CM | POA: Diagnosis not present

## 2022-08-30 DIAGNOSIS — L89622 Pressure ulcer of left heel, stage 2: Secondary | ICD-10-CM | POA: Diagnosis not present

## 2022-08-30 DIAGNOSIS — R5381 Other malaise: Secondary | ICD-10-CM | POA: Diagnosis not present

## 2022-08-30 DIAGNOSIS — K219 Gastro-esophageal reflux disease without esophagitis: Secondary | ICD-10-CM | POA: Diagnosis present

## 2022-08-30 DIAGNOSIS — R52 Pain, unspecified: Secondary | ICD-10-CM | POA: Diagnosis not present

## 2022-08-30 DIAGNOSIS — L89150 Pressure ulcer of sacral region, unstageable: Secondary | ICD-10-CM | POA: Diagnosis present

## 2022-08-30 DIAGNOSIS — Z881 Allergy status to other antibiotic agents status: Secondary | ICD-10-CM | POA: Diagnosis not present

## 2022-08-30 DIAGNOSIS — W19XXXA Unspecified fall, initial encounter: Secondary | ICD-10-CM | POA: Diagnosis present

## 2022-08-30 DIAGNOSIS — Z6827 Body mass index (BMI) 27.0-27.9, adult: Secondary | ICD-10-CM | POA: Diagnosis not present

## 2022-08-30 DIAGNOSIS — N1832 Chronic kidney disease, stage 3b: Secondary | ICD-10-CM | POA: Diagnosis present

## 2022-08-30 DIAGNOSIS — E876 Hypokalemia: Secondary | ICD-10-CM | POA: Diagnosis present

## 2022-08-30 DIAGNOSIS — R531 Weakness: Secondary | ICD-10-CM | POA: Diagnosis not present

## 2022-08-30 DIAGNOSIS — M4856XA Collapsed vertebra, not elsewhere classified, lumbar region, initial encounter for fracture: Secondary | ICD-10-CM | POA: Diagnosis present

## 2022-08-30 DIAGNOSIS — E440 Moderate protein-calorie malnutrition: Secondary | ICD-10-CM | POA: Diagnosis present

## 2022-08-30 DIAGNOSIS — Z66 Do not resuscitate: Secondary | ICD-10-CM | POA: Diagnosis present

## 2022-08-30 DIAGNOSIS — F32A Depression, unspecified: Secondary | ICD-10-CM | POA: Diagnosis not present

## 2022-08-30 DIAGNOSIS — I1310 Hypertensive heart and chronic kidney disease without heart failure, with stage 1 through stage 4 chronic kidney disease, or unspecified chronic kidney disease: Secondary | ICD-10-CM | POA: Diagnosis not present

## 2022-08-30 DIAGNOSIS — E8809 Other disorders of plasma-protein metabolism, not elsewhere classified: Secondary | ICD-10-CM | POA: Diagnosis present

## 2022-08-30 DIAGNOSIS — E782 Mixed hyperlipidemia: Secondary | ICD-10-CM | POA: Diagnosis present

## 2022-08-30 DIAGNOSIS — E039 Hypothyroidism, unspecified: Secondary | ICD-10-CM | POA: Diagnosis present

## 2022-08-30 DIAGNOSIS — Z7401 Bed confinement status: Secondary | ICD-10-CM | POA: Diagnosis not present

## 2022-08-30 DIAGNOSIS — I129 Hypertensive chronic kidney disease with stage 1 through stage 4 chronic kidney disease, or unspecified chronic kidney disease: Secondary | ICD-10-CM | POA: Diagnosis present

## 2022-08-30 DIAGNOSIS — E872 Acidosis, unspecified: Secondary | ICD-10-CM | POA: Diagnosis present

## 2022-08-30 DIAGNOSIS — G2581 Restless legs syndrome: Secondary | ICD-10-CM | POA: Diagnosis present

## 2022-08-30 DIAGNOSIS — E860 Dehydration: Secondary | ICD-10-CM | POA: Diagnosis present

## 2022-08-30 DIAGNOSIS — J449 Chronic obstructive pulmonary disease, unspecified: Secondary | ICD-10-CM | POA: Diagnosis present

## 2022-08-30 LAB — COMPREHENSIVE METABOLIC PANEL
ALT: 26 U/L (ref 0–44)
AST: 30 U/L (ref 15–41)
Albumin: 2.6 g/dL — ABNORMAL LOW (ref 3.5–5.0)
Alkaline Phosphatase: 114 U/L (ref 38–126)
Anion gap: 7 (ref 5–15)
BUN: 36 mg/dL — ABNORMAL HIGH (ref 8–23)
CO2: 18 mmol/L — ABNORMAL LOW (ref 22–32)
Calcium: 8.5 mg/dL — ABNORMAL LOW (ref 8.9–10.3)
Chloride: 114 mmol/L — ABNORMAL HIGH (ref 98–111)
Creatinine, Ser: 1.5 mg/dL — ABNORMAL HIGH (ref 0.44–1.00)
GFR, Estimated: 36 mL/min — ABNORMAL LOW (ref >60)
Glucose, Bld: 69 mg/dL — ABNORMAL LOW (ref 70–99)
Potassium: 4 mmol/L (ref 3.5–5.1)
Sodium: 139 mmol/L (ref 135–145)
Total Bilirubin: 0.8 mg/dL (ref 0.3–1.2)
Total Protein: 5.3 g/dL — ABNORMAL LOW (ref 6.5–8.1)

## 2022-08-30 LAB — C DIFFICILE QUICK SCREEN W PCR REFLEX
C Diff antigen: POSITIVE — AB
C Diff toxin: NEGATIVE

## 2022-08-30 LAB — CBC
HCT: 30.5 % — ABNORMAL LOW (ref 36.0–46.0)
Hemoglobin: 9.5 g/dL — ABNORMAL LOW (ref 12.0–15.0)
MCH: 31.8 pg (ref 26.0–34.0)
MCHC: 31.1 g/dL (ref 30.0–36.0)
MCV: 102 fL — ABNORMAL HIGH (ref 80.0–100.0)
Platelets: 281 10*3/uL (ref 150–400)
RBC: 2.99 MIL/uL — ABNORMAL LOW (ref 3.87–5.11)
RDW: 14.4 % (ref 11.5–15.5)
WBC: 8.5 10*3/uL (ref 4.0–10.5)
nRBC: 0 % (ref 0.0–0.2)

## 2022-08-30 LAB — PHOSPHORUS: Phosphorus: 3.9 mg/dL (ref 2.5–4.6)

## 2022-08-30 LAB — MAGNESIUM: Magnesium: 1.7 mg/dL (ref 1.7–2.4)

## 2022-08-30 LAB — GLUCOSE, CAPILLARY
Glucose-Capillary: 198 mg/dL — ABNORMAL HIGH (ref 70–99)
Glucose-Capillary: 119 mg/dL — ABNORMAL HIGH (ref 70–99)

## 2022-08-30 LAB — CLOSTRIDIUM DIFFICILE BY PCR, REFLEXED: Toxigenic C. Difficile by PCR: POSITIVE — AB

## 2022-08-30 MED ORDER — DEXTROSE-NACL 5-0.9 % IV SOLN: INTRAVENOUS | Status: DC

## 2022-08-30 MED ORDER — JUVEN PO PACK
1.0000 | PACK | Freq: Two times a day (BID) | ORAL | Status: DC
  Administered 2022-08-31 – 2022-09-04 (×8): 1 via ORAL
  Filled 2022-08-30 (×8): qty 1

## 2022-08-30 MED ORDER — SODIUM CHLORIDE 0.9 % IV SOLN: INTRAVENOUS | Status: DC

## 2022-08-30 MED ORDER — MEDIHONEY WOUND/BURN DRESSING EX PSTE
1.0000 | PASTE | Freq: Every day | CUTANEOUS | Status: DC
  Administered 2022-08-30 – 2022-09-04 (×6): 1 via TOPICAL
  Filled 2022-08-30: qty 44

## 2022-08-30 MED ORDER — SODIUM BICARBONATE 650 MG PO TABS
650.0000 mg | ORAL_TABLET | Freq: Two times a day (BID) | ORAL | Status: DC
  Administered 2022-08-30 – 2022-09-02 (×7): 650 mg via ORAL
  Filled 2022-08-30 (×7): qty 1

## 2022-08-30 MED ORDER — ZINC OXIDE 40 % EX OINT
TOPICAL_OINTMENT | Freq: Three times a day (TID) | CUTANEOUS | Status: DC
  Filled 2022-08-30 (×2): qty 57

## 2022-08-30 NOTE — [place] FL2 (Deleted)
[place] MEDICAID FL2 LEVEL OF CARE FORM     IDENTIFICATION  Patient Name: [name] Birthdate: [birth year] Sex: female Admission Date (Current Location): 08/29/2022  [facility] and [place] Number:  [employer] and Address:  [facility],  [address] [place] [address]      Provider Number: [id number]  Attending Physician Name and Address:  Burnadette Pop, MD  Relative Name and Phone Number:       Current Level of Care: Hospital Recommended Level of Care: Skilled Nursing Facility Prior Approval Number:    Date Approved/Denied:   PASRR Number: [id number]  Discharge Plan: SNF    Current Diagnoses: Patient Active Problem List   Diagnosis Date Noted   Frequent falls 08/30/2022   Fall at home, initial encounter 08/29/2022   Low back pain 08/29/2022   Failure to thrive in adult 08/29/2022   Physical deconditioning 08/29/2022   Elevated CK 08/29/2022   Decubitus ulcer 08/29/2022   GERD (gastroesophageal reflux disease) 08/29/2022   Restless leg syndrome 08/29/2022   Rhabdomyolysis 07/07/2022   Dehydration 07/07/2022   Transaminitis 07/07/2022   Hypotension 07/07/2022   SIRS (systemic inflammatory response syndrome) 07/07/2022   Hypokalemia 07/07/2022   Stage 3b chronic kidney disease (CKD) 07/07/2022   Hypothermia 07/07/2022   Acute metabolic encephalopathy 07/06/2022   BMI 26.0-26.9,adult 05/06/2022   Atrial fibrillation 04/12/2022   Congenital heart failure 04/12/2022   Malnutrition of moderate degree 01/14/2022   Respiratory failure requiring intubation 01/11/2022   Septic shock 01/10/2022   Pressure injury of skin 01/10/2022   Small bowel obstruction 01/07/2022   Abnormal weight loss 11/28/2021   Family history of malignant neoplasm of digestive organs 11/28/2021   Intestinal malabsorption 11/28/2021   Renal insufficiency 12/08/2020   Degeneration of lumbar intervertebral disc 01/07/2020   Venous (peripheral) insufficiency  09/30/2019   Anemia 08/12/2017   Vitamin D deficiency 08/12/2017   Lumbar spondylosis with myelopathy 06/03/2017   Primary osteoarthritis of both knees 03/19/2017   Anxiety 02/17/2017   History of pulmonary embolus (PE) 07/09/2016   COPD (chronic obstructive pulmonary disease) 01/18/2016   Mixed hyperlipidemia 01/18/2016   Depression 01/18/2016   Essential hypertension 01/18/2016   H/O spinal fusion 01/18/2016   Acquired hypothyroidism 01/18/2016   Thoracic aorta atherosclerosis 01/18/2016   Chronic cholecystitis with calculus 10/04/2013    Orientation RESPIRATION BLADDER Height & Weight     Self, Time, Situation, Place    Incontinent Weight: 128 lb 15.5 oz (58.5 kg) Height:  5\' 2"  (157.5 cm)  BEHAVIORAL SYMPTOMS/MOOD NEUROLOGICAL BOWEL NUTRITION STATUS      Incontinent Diet (see dc summary)  AMBULATORY STATUS COMMUNICATION OF NEEDS Skin   Extensive Assist Verbally                         Personal Care Assistance Level of Assistance  Bathing, Feeding, Dressing Bathing Assistance: Limited assistance Feeding assistance: Independent Dressing Assistance: Limited assistance     Functional Limitations Info  Sight, Hearing, Speech Sight Info: Adequate Hearing Info: Adequate Speech Info: Adequate    SPECIAL CARE FACTORS FREQUENCY  PT (By licensed PT), OT (By licensed OT)     PT Frequency: 5x week OT Frequency: 3x week            Contractures Contractures Info: Not present    Additional Factors Info  Code Status, Allergies Code Status Info: DNR Allergies Info: Celebrex (Celecoxib), Keflet (Cephalexin), Penicillins, Sulfa Antibiotics, Kenalog (Triamcinolone Acetonide), Latex, Lisinopril, Mobic (Meloxicam),  Penicillin G           Current Medications (08/30/2022):  This is the current hospital active medication list Current Facility-Administered Medications  Medication Dose Route Frequency Provider Last Rate Last Admin   0.9 %  sodium chloride infusion    Intravenous Continuous Burnadette PopAdhikari, Amrit, MD 100 mL/hr at 08/30/22 1131 New Bag at 08/30/22 1131   acetaminophen (TYLENOL) tablet 650 mg  650 mg Oral Q6H PRN Adefeso, Oladapo, DO       Or   acetaminophen (TYLENOL) suppository 650 mg  650 mg Rectal Q6H PRN Adefeso, Oladapo, DO       cholestyramine light (PREVALITE) packet 4 g  4 g Oral BID Adefeso, Oladapo, DO   4 g at 08/30/22 1038   enoxaparin (LOVENOX) injection 30 mg  30 mg Subcutaneous Q24H Adefeso, Oladapo, DO   30 mg at 08/29/22 2159   feeding supplement (ENSURE ENLIVE / ENSURE PLUS) liquid 237 mL  237 mL Oral BID BM Adefeso, Oladapo, DO   237 mL at 08/30/22 1129   leptospermum manuka honey (MEDIHONEY) paste 1 Application  1 Application Topical Daily Burnadette PopAdhikari, Amrit, MD   1 Application at 08/30/22 1438   levothyroxine (SYNTHROID) tablet 75 mcg  75 mcg Oral Q0600 Adefeso, Oladapo, DO   75 mcg at 08/30/22 0510   liver oil-zinc oxide (DESITIN) 40 % ointment   Topical TID Burnadette PopAdhikari, Amrit, MD   Given at 08/30/22 1439   loperamide (IMODIUM) capsule 2 mg  2 mg Oral PRN Adefeso, Oladapo, DO       [START ON 08/31/2022] nutrition supplement (JUVEN) (JUVEN) powder packet 1 packet  1 packet Oral BID BM Adhikari, Amrit, MD       ondansetron (ZOFRAN) tablet 4 mg  4 mg Oral Q6H PRN Adefeso, Oladapo, DO       Or   ondansetron (ZOFRAN) injection 4 mg  4 mg Intravenous Q6H PRN Adefeso, Oladapo, DO       oxyCODONE-acetaminophen (PERCOCET/ROXICET) 5-325 MG per tablet 1 tablet  1 tablet Oral Q4H PRN Adefeso, Oladapo, DO   1 tablet at 08/30/22 1126   pantoprazole (PROTONIX) EC tablet 40 mg  40 mg Oral q morning Adefeso, Oladapo, DO   40 mg at 08/30/22 1038   rOPINIRole (REQUIP) tablet 3 mg  3 mg Oral QHS Adefeso, Oladapo, DO   3 mg at 08/29/22 2156   sodium bicarbonate tablet 650 mg  650 mg Oral BID Burnadette PopAdhikari, Amrit, MD   650 mg at 08/30/22 1453     Discharge Medications: Please see discharge summary for a list of discharge medications.  Relevant Imaging  Results:  Relevant Lab Results:   Additional Information SSN: [id number]  Jidenna Figgs, [place]

## 2022-08-30 NOTE — Progress Notes (Signed)
Initial Nutrition Assessment  DOCUMENTATION CODES:   Non-severe (moderate) malnutrition in context of chronic illness  INTERVENTION:   -Downgrade diet -Dysphagia 2 (ground textures)  -1 packet Juven BID, each packet provides 95 calories, 2.5 grams of protein (collagen), 300 mg vitamin C, 15 mg vitamin E, 1.2 mcg vitamin B-12, 9.5 mg zinc, 200 mg calcium, and 1.5 g  Calcium Beta-hydroxy-Beta-methylbutyrate to support wound healing   Ensure Enlive TID  Yogurt with meals  Request current weight to verify severe wt change   Check zinc level  NUTRITION DIAGNOSIS:   Moderate Malnutrition related to altered GI function (chronic diarrhea) as evidenced by per patient/family report, moderate fat depletion, moderate muscle depletion.   GOAL:  Patient will meet greater than or equal to 90% of their needs   MONITOR:   Po intake, labs and wt trends   REASON FOR ASSESSMENT:   Consult Assessment of nutrition requirement/status  ASSESSMENT: Patient is a 77 yo female with hx of COPD, HTN, GERD, CKD-3b, Depression, anxiety, Vitamin D deficiency, chronic diarrhea who presents following a fall at home. Lives with her daughter per chart.   Rectal tube placed overnight and patient also has foley per nursing. Sacral wound. Patient has hx of coccyx wound in August 2023.  Patient ate bites for breakfast and drank ~25% of an Ensure (but required encouragement). Her daughter in law is bedside encouraging her to eat lunch. Patient has poor dentition and complains of chewing difficulty.   Patient has only taken some fruit and a bite of potatoes and beef. Patient is followed by Dr Loreta Ave for due to her chronic diarrhea.   At home she eats oatmeal, mashed potatoes, yogurt. Limits protein due to chewing problem and poor appetite. Discussed alternate sources of protein and emphasized the importance.   Patient weights reviewed.   Medications reviewed.   IVF-NS @ 100 ml/hr      Latest Ref Rng & Units  08/30/2022    4:02 AM 08/29/2022    3:48 PM 07/10/2022    5:08 AM  BMP  Glucose 70 - 99 mg/dL 69  86  79   BUN 8 - 23 mg/dL 36  35  19   Creatinine 0.44 - 1.00 mg/dL 1.18  8.67  7.37   Sodium 135 - 145 mmol/L 139  135  139   Potassium 3.5 - 5.1 mmol/L 4.0  4.5  4.0   Chloride 98 - 111 mmol/L 114  109  116   CO2 22 - 32 mmol/L 18  18  19    Calcium 8.9 - 10.3 mg/dL 8.5  8.8  8.2      NUTRITION - FOCUSED PHYSICAL EXAM:  Flowsheet Row Most Recent Value  Orbital Region No depletion  Upper Arm Region Severe depletion  Thoracic and Lumbar Region Moderate depletion  Buccal Region Moderate depletion  Temple Region Mild depletion  Clavicle Bone Region Severe depletion  Clavicle and Acromion Bone Region Severe depletion  Scapular Bone Region Moderate depletion  Dorsal Hand Mild depletion  Patellar Region Moderate depletion  Anterior Thigh Region Moderate depletion  Posterior Calf Region Unable to assess  Edema (RD Assessment) Mild  Hair Reviewed  Eyes Reviewed  Mouth Reviewed  Skin Reviewed  Nails Reviewed       Diet Order:   Diet Order             Diet Heart Room service appropriate? Yes; Fluid consistency: Thin  Diet effective now  EDUCATION NEEDS:  Education needs have been addressed  Skin:  Skin Assessment: Reviewed RN Assessment-redness to buttocks, unstageable sacral wound (WOC consulted). See current picture- from 4/4 ED -Dr Keenan Bachelor.   Last BM:  4/5-multiple type 7 stools overnight. Flexiseal placed per nursing   Height:   Ht Readings from Last 1 Encounters:  08/29/22 5\' 2"  (1.575 m)    Weight:   Wt Readings from Last 1 Encounters:  08/29/22 58.5 kg    Ideal Body Weight:   50 kg  BMI:  Body mass index is 23.59 kg/m.  Estimated Nutritional Needs:   Kcal:  1700-1800  Protein:  75-80 gr  Fluid:  >1500 ml daily  Royann Shivers MS,RD,CSG,LDN Contact: Loretha Stapler

## 2022-08-30 NOTE — [place] FL2 (Signed)
[place] MEDICAID FL2 LEVEL OF CARE FORM     IDENTIFICATION  Patient Name: [name] Birthdate: 1945/06/06 Sex: female Admission Date (Current Location): 08/29/2022  [facility] and [place] Number:  [employer] and Address:  [facility],  [address] [place] [address]      Provider Number: [id number]  Attending Physician Name and Address:  Burnadette Pop, MD  Relative Name and Phone Number:       Current Level of Care: Hospital Recommended Level of Care: Skilled Nursing Facility Prior Approval Number:    Date Approved/Denied:   PASRR Number: [id number]  Discharge Plan: SNF    Current Diagnoses: Patient Active Problem List   Diagnosis Date Noted   Frequent falls 08/30/2022   Fall at home, initial encounter 08/29/2022   Low back pain 08/29/2022   Failure to thrive in adult 08/29/2022   Physical deconditioning 08/29/2022   Elevated CK 08/29/2022   Decubitus ulcer 08/29/2022   GERD (gastroesophageal reflux disease) 08/29/2022   Restless leg syndrome 08/29/2022   Rhabdomyolysis 07/07/2022   Dehydration 07/07/2022   Transaminitis 07/07/2022   Hypotension 07/07/2022   SIRS (systemic inflammatory response syndrome) 07/07/2022   Hypokalemia 07/07/2022   Stage 3b chronic kidney disease (CKD) 07/07/2022   Hypothermia 07/07/2022   Acute metabolic encephalopathy 07/06/2022   BMI 26.0-26.9,adult 05/06/2022   Atrial fibrillation 04/12/2022   Congenital heart failure 04/12/2022   Malnutrition of moderate degree 01/14/2022   Respiratory failure requiring intubation 01/11/2022   Septic shock 01/10/2022   Pressure injury of skin 01/10/2022   Small bowel obstruction 01/07/2022   Abnormal weight loss 11/28/2021   Family history of malignant neoplasm of digestive organs 11/28/2021   Intestinal malabsorption 11/28/2021   Renal insufficiency 12/08/2020   Degeneration of lumbar intervertebral disc 01/07/2020   Venous (peripheral) insufficiency  09/30/2019   Anemia 08/12/2017   Vitamin D deficiency 08/12/2017   Lumbar spondylosis with myelopathy 06/03/2017   Primary osteoarthritis of both knees 03/19/2017   Anxiety 02/17/2017   History of pulmonary embolus (PE) 07/09/2016   COPD (chronic obstructive pulmonary disease) 01/18/2016   Mixed hyperlipidemia 01/18/2016   Depression 01/18/2016   Essential hypertension 01/18/2016   H/O spinal fusion 01/18/2016   Acquired hypothyroidism 01/18/2016   Thoracic aorta atherosclerosis 01/18/2016   Chronic cholecystitis with calculus 10/04/2013    Orientation RESPIRATION BLADDER Height & Weight     Self, Time, Situation, Place  Normal Incontinent Weight: 128 lb 15.5 oz (58.5 kg) Height:  5\' 2"  (157.5 cm)  BEHAVIORAL SYMPTOMS/MOOD NEUROLOGICAL BOWEL NUTRITION STATUS      Incontinent Diet (see dc summary)  AMBULATORY STATUS COMMUNICATION OF NEEDS Skin   Extensive Assist Verbally Other (Comment), PU Stage and Appropriate Care (L Heel deep tissue pressure injury, medial coccyx unstageable, Buttock irritant dermatitis)                       Personal Care Assistance Level of Assistance  Bathing, Feeding, Dressing Bathing Assistance: Limited assistance Feeding assistance: Independent Dressing Assistance: Limited assistance     Functional Limitations Info  Sight, Hearing, Speech Sight Info: Adequate Hearing Info: Adequate Speech Info: Adequate    SPECIAL CARE FACTORS FREQUENCY  PT (By licensed PT), OT (By licensed OT)     PT Frequency: 5x week OT Frequency: 3x week            Contractures Contractures Info: Not present    Additional Factors Info  Code Status, Allergies Code Status Info: DNR  Allergies Info: Celebrex (Celecoxib), Keflet (Cephalexin), Penicillins, Sulfa Antibiotics, Kenalog (Triamcinolone Acetonide), Latex, Lisinopril, Mobic (Meloxicam), Penicillin G           Current Medications (08/30/2022):  This is the current hospital active medication  list Current Facility-Administered Medications  Medication Dose Route Frequency Provider Last Rate Last Admin   0.9 %  sodium chloride infusion   Intravenous Continuous Burnadette PopAdhikari, Amrit, MD 100 mL/hr at 08/30/22 1131 New Bag at 08/30/22 1131   acetaminophen (TYLENOL) tablet 650 mg  650 mg Oral Q6H PRN Adefeso, Oladapo, DO       Or   acetaminophen (TYLENOL) suppository 650 mg  650 mg Rectal Q6H PRN Adefeso, Oladapo, DO       cholestyramine light (PREVALITE) packet 4 g  4 g Oral BID Adefeso, Oladapo, DO   4 g at 08/30/22 1038   enoxaparin (LOVENOX) injection 30 mg  30 mg Subcutaneous Q24H Adefeso, Oladapo, DO   30 mg at 08/29/22 2159   feeding supplement (ENSURE ENLIVE / ENSURE PLUS) liquid 237 mL  237 mL Oral BID BM Adefeso, Oladapo, DO   237 mL at 08/30/22 1129   leptospermum manuka honey (MEDIHONEY) paste 1 Application  1 Application Topical Daily Burnadette PopAdhikari, Amrit, MD   1 Application at 08/30/22 1438   levothyroxine (SYNTHROID) tablet 75 mcg  75 mcg Oral Q0600 Adefeso, Oladapo, DO   75 mcg at 08/30/22 0510   liver oil-zinc oxide (DESITIN) 40 % ointment   Topical TID Burnadette PopAdhikari, Amrit, MD   Given at 08/30/22 1439   loperamide (IMODIUM) capsule 2 mg  2 mg Oral PRN Adefeso, Oladapo, DO       [START ON 08/31/2022] nutrition supplement (JUVEN) (JUVEN) powder packet 1 packet  1 packet Oral BID BM Adhikari, Amrit, MD       ondansetron (ZOFRAN) tablet 4 mg  4 mg Oral Q6H PRN Adefeso, Oladapo, DO       Or   ondansetron (ZOFRAN) injection 4 mg  4 mg Intravenous Q6H PRN Adefeso, Oladapo, DO       oxyCODONE-acetaminophen (PERCOCET/ROXICET) 5-325 MG per tablet 1 tablet  1 tablet Oral Q4H PRN Adefeso, Oladapo, DO   1 tablet at 08/30/22 1126   pantoprazole (PROTONIX) EC tablet 40 mg  40 mg Oral q morning Adefeso, Oladapo, DO   40 mg at 08/30/22 1038   rOPINIRole (REQUIP) tablet 3 mg  3 mg Oral QHS Adefeso, Oladapo, DO   3 mg at 08/29/22 2156   sodium bicarbonate tablet 650 mg  650 mg Oral BID Burnadette PopAdhikari, Amrit, MD    650 mg at 08/30/22 1453     Discharge Medications: Please see discharge summary for a list of discharge medications.  Relevant Imaging Results:  Relevant Lab Results:   Additional Information SSN: [id number]  Lainey Nelson, [place]

## 2022-08-30 NOTE — Progress Notes (Signed)
Pt A&O x 2. Pt more alert this a.m. Pt was responding to voice on arrival to unit. Pt had multiple type 7 stools during the night. MD notified, rectal tube placed per order. Moisture barrier cream placed several times to sacrum during the night. Vitals stable.

## 2022-08-30 NOTE — Consult Note (Addendum)
WOC Nurse Consult Note: This consult performed remotely utilizing EMR, photos and communication with bedside staff  Reason for Consult: sacral wound Wound type:Unstageable Pressure Injury  Pressure Injury POA: Yes Measurement: see nursing flowsheet  Wound bed:100% brown black tan devitalized tissue  Drainage (amount, consistency, odor) see nursing flowsheet  Periwound: buttocks and sacrum have appearance of chronic tissue damage red purple discoloration .  Also Moisture Associated Skin Damage per note patient moist with fecal incontinence, flexiseal in place  ICD-10 CM Codes for Irritant Dermatitis LL24A2 - Due to fecal, urinary or dual incontinence Dressing procedure/placement/frequency:  Clean sacral wound with NS, apply Medihoney to wound bed daily, cover with dry gauze and secure with foam dressing. May lift foam dressing to reapply Medihoney daily.  Change foam dressing q3 days and prn soiling.  Add Desitin three times daily for buttocks and perineal area.   Patient should remain on low air loss mattress throughout hospitalization for moisture management and pressure redistribution.    Discussed POC with bedside nurse.  The WOC team will not follow patient at this time.  Re-consult if further needs arise.   Thank you,    Priscella Mann MSN, RN-BC, [employer] [phone]

## 2022-08-30 NOTE — Plan of Care (Signed)
  Problem: Acute Rehab PT Goals(only PT should resolve) Goal: Pt Will Go Supine/Side To Sit Outcome: Progressing Flowsheets (Taken 08/30/2022 1443) Pt will go Supine/Side to Sit: with minimal assist Goal: Pt Will Go Sit To Supine/Side Outcome: Progressing Flowsheets (Taken 08/30/2022 1443) Pt will go Sit to Supine/Side:  with min guard assist  with minimal assist Goal: Patient Will Transfer Sit To/From Stand Outcome: Progressing Flowsheets (Taken 08/30/2022 1443) Patient will transfer sit to/from stand: with moderate assist Goal: Pt Will Transfer Bed To Chair/Chair To Bed Outcome: Progressing Flowsheets (Taken 08/30/2022 1443) Pt will Transfer Bed to Chair/Chair to Bed: with mod assist Goal: Pt Will Ambulate Outcome: Progressing Flowsheets (Taken 08/30/2022 1443) Pt will Ambulate:  15 feet  with minimal assist  with moderate assist  with rolling walker Goal: Pt/caregiver will Perform Home Exercise Program Outcome: Progressing Flowsheets (Taken 08/30/2022 1443) Pt/caregiver will Perform Home Exercise Program:  For increased strengthening  For improved balance  With Supervision, verbal cues required/provided    2:44 PM, 08/30/22 Wyman Songster PT, DPT Physical Therapist at [facility]

## 2022-08-30 NOTE — Progress Notes (Signed)
PROGRESS NOTE  Nyanza Kindschi  UIV:[id number] DOB: 02-06-1946 DOA: 08/29/2022 PCP: [name], FNP   Brief Narrative:  Patient is a 77 year old female with history of hypothyroidism, GERD, restless leg syndrome, CKD stage IIIb with baseline creatinine of 1.7-1.9 who presents to the emergency department for the evaluation of fall at home.  She was just discharged from skilled nursing facility to home. She was admitted from 2/10 to 2/14 for acute metabolic encephalopathy, rhabdomyolysis, dehydration, hypokalemia and diarrhea.  Patient fell and was unable to get up from the floor. Lives with daughter. She also complained of low back pain, pelvic pain .  On presentation, she was hemodynamically stable.  Lab work showed WBC count of 10.8, creatinine 1.6, CK of 408.  CT lumbar spine without contrast, CT head without contrast, chest x-ray, pelvic x-ray did not show any acute findings.  Lumbar spine x-ray showed new mild compression deformity of L1 vertebral body.  Patient was started on IV fluids.  Patient also found to have sacral ulcer.  Plan for wound care, PT/OT assessment  Assessment & Plan:  Principal Problem:   Fall at home, initial encounter Active Problems:   Mixed hyperlipidemia   Acquired hypothyroidism   Dehydration   Low back pain   Failure to thrive in adult   Physical deconditioning   Elevated CK   Decubitus ulcer   GERD (gastroesophageal reflux disease)   Restless leg syndrome  Fall: Fell at home.  Recently discharged from skilled nursing facility.  Chronic ambulatory dysfunction.  PT/OT evaluation pending.  Might need SNF on discharge  Elevated CK: Likely from rhabdomyolysis from fall.  Continue gentle IV fluids.  Sacral decubitus ulcer: Wound care consulted.Doesnot appear to be infected  Mild compression fracture of L1 vertebral body: Continue supportive care.  Currently she denies any back pain.  Will order TLSO brace if pain worsens  Diarrhea: Reported to have 2-3  loose bowel movements in the ED.  Chronic diarrhea as per previous notes.  She takes Imodium at home as needed.  Continue the same.  Also on cholestyramine C. difficile/GI pathogen panel will be obtained.  Rectal tube placed.  Non-anion gap metabolic acidosis: CO2 is 18.  Likely associated with diarrhea.  Will start on sodium bicarb tablets  Hypothyroidism: Continue Synthyroid  GERD: Continue Protonix  RLS: Continue Requip  Moderate protein calorie moderation: Albumin of 2.6.  Nutritionist consulted and following  CKD stage IIIb: Baseline creatinine ranges from 1.7-1.9.  Currently kidney function at baseline      Pressure Injury 01/10/22 Coccyx Medial;Mid Unstageable - Full thickness tissue loss in which the base of the injury is covered by slough (yellow, tan, gray, green or brown) and/or eschar (tan, brown or black) in the wound bed. dark brown, black in color (Active)  01/10/22 1200  Location: Coccyx  Location Orientation: Medial;Mid  Staging: Unstageable - Full thickness tissue loss in which the base of the injury is covered by slough (yellow, tan, gray, green or brown) and/or eschar (tan, brown or black) in the wound bed.  Wound Description (Comments): dark brown, black in color  Present on Admission: Yes  Dressing Type Foam - Lift dressing to assess site every shift 08/29/22 2102    DVT prophylaxis:enoxaparin (LOVENOX) injection 30 mg Start: 08/29/22 2200 SCDs Start: 08/29/22 2010     Code Status: DNR  Family Communication: Called and discussed with brother on phone on 4/5  Patient status: Observation  Patient is from : Inpatient  Anticipated discharge to: Home  Estimated  DC date: SNF   Consultants: None  Procedures: None  Antimicrobials:  Anti-infectives (From admission, onward)    None       Subjective: Patient seen and examined at bedside today.  Hemodynamically stable.  Lying on bed, appears very deconditioned, weak.  Denies any specific complaints.   Has some loose stools going on.  Denies any abdominal pain, nausea or vomiting.  Denies any back pain.  Objective: Vitals:   08/29/22 2000 08/29/22 2102 08/30/22 0107 08/30/22 0506  BP: 107/61 131/69 127/74 139/70  Pulse: 75 83 66 69  Resp: 12 14 14 14   Temp:  98.4 F (36.9 C) 97.7 F (36.5 C) 98.6 F (37 C)  TempSrc:  Axillary Oral Axillary  SpO2: 99% 97% 98% 98%  Weight:  58.5 kg    Height:  5\' 2"  (1.575 m)      Intake/Output Summary (Last 24 hours) at 08/30/2022 0736 Last data filed at 08/30/2022 0540 Gross per 24 hour  Intake 1677.77 ml  Output 1 ml  Net 1676.77 ml   Filed Weights   08/29/22 1411 08/29/22 2102  Weight: 72.6 kg 58.5 kg    Examination:  General exam: Overall comfortable, not in distress, very deconditioned HEENT: PERRL Respiratory system:  no wheezes or crackles  Cardiovascular system: S1 & S2 heard, RRR.  Gastrointestinal system: Abdomen is nondistended, soft and nontender. Central nervous system: Alert and oriented Extremities: No edema, no clubbing ,no cyanosis Skin: Scattered ecchymosis, pressure ulcer, rectal tube   Data Reviewed: I have personally reviewed following labs and imaging studies  CBC: Recent Labs  Lab 08/29/22 1548 08/30/22 0402  WBC 10.8* 8.5  NEUTROABS 8.9*  --   HGB 11.6* 9.5*  HCT 36.1 30.5*  MCV 98.9 102.0*  PLT 345 281   Basic Metabolic Panel: Recent Labs  Lab 08/29/22 1548 08/30/22 0402  NA 135 139  K 4.5 4.0  CL 109 114*  CO2 18* 18*  GLUCOSE 86 69*  BUN 35* 36*  CREATININE 1.60* 1.50*  CALCIUM 8.8* 8.5*  MG  --  1.7  PHOS  --  3.9     No results found for this or any previous visit (from the past 240 hour(s)).   Radiology Studies: CT Lumbar Spine Wo Contrast  Result Date: 08/29/2022 CLINICAL DATA:  Patient fell last night at home found by brother on floor today. Lower back pain. EXAM: CT LUMBAR SPINE WITHOUT CONTRAST TECHNIQUE: Multidetector CT imaging of the lumbar spine was performed without  intravenous contrast administration. Multiplanar CT image reconstructions were also generated. RADIATION DOSE REDUCTION: This exam was performed according to the departmental dose-optimization program which includes automated exposure control, adjustment of the mA and/or kV according to patient size and/or use of iterative reconstruction technique. COMPARISON:  Radiographs 08/29/2022 and CT abdomen and pelvis 01/09/2022 FINDINGS: Segmentation: 5 lumbar type vertebrae. Alignment: Grade 1 anterolisthesis L4. No evidence of traumatic malalignment. Vertebrae: No acute fracture or focal pathologic process. The L1 vertebral body height loss seen on radiograph earlier today was likely artifactual due to overlapping structures as the L1 vertebral body demonstrates normal height and appears similar to 01/09/2022. Paraspinal and other soft tissues: No acute abnormality. Aortic calcification. Disc levels: Posterior fusion L4-S1 with interbody spacers at L4-L5 and L5-S1. Multilevel spondylosis, disc space height loss, and degenerative endplate changes greatest at T12-L1 where it is moderate. Lower lumbar facet arthropathy. Streak artifact from the lumbar fusion degrades evaluation of the spinal canal and neural foramina at L4-S1. No high-grade spinal  canal narrowing. IMPRESSION: No acute fracture or evidence of traumatic malalignment in the lumbar spine. Electronically Signed   By: Minerva Fester M.D.   On: 08/29/2022 18:56   CT Head Wo Contrast  Result Date: 08/29/2022 CLINICAL DATA:  Fall.  Head trauma. EXAM: CT HEAD WITHOUT CONTRAST TECHNIQUE: Contiguous axial images were obtained from the base of the skull through the vertex without intravenous contrast. RADIATION DOSE REDUCTION: This exam was performed according to the departmental dose-optimization program which includes automated exposure control, adjustment of the mA and/or kV according to patient size and/or use of iterative reconstruction technique. COMPARISON:   Head CT 07/06/2022. FINDINGS: Brain: No acute hemorrhage. Unchanged chronic small-vessel disease. Cortical gray-white differentiation is otherwise preserved. Prominence of the ventricles and sulci within normal limits for age. No extra-axial collection. Basilar cisterns are patent. Vascular: No hyperdense vessel or unexpected calcification. Skull: No calvarial fracture or suspicious bone lesion. Skull base is unremarkable. Sinuses/Orbits: Mucous retention cysts in the left maxillary sinus. Orbits are unremarkable. Mastoids are well aerated. Other: None. IMPRESSION: No acute intracranial abnormality. Electronically Signed   By: Orvan Falconer M.D.   On: 08/29/2022 15:42   DG Thoracic Spine 2 View  Result Date: 08/29/2022 CLINICAL DATA:  Fall. EXAM: THORACIC SPINE 2 VIEWS COMPARISON:  Chest radiograph 01/22/2022. FINDINGS: Two views. There is no evidence of thoracic spine fracture. Alignment is normal. Multilevel degenerative changes with calcified discs and flowing anterior osteophytes. IMPRESSION: No evidence of thoracic spine fracture or traumatic malalignment. Electronically Signed   By: Orvan Falconer M.D.   On: 08/29/2022 15:29   DG Lumbar Spine Complete  Result Date: 08/29/2022 CLINICAL DATA:  Fall. EXAM: LUMBAR SPINE - COMPLETE 4+ VIEW COMPARISON:  MRI lumbar spine 12/27/2019. FINDINGS: Five views. Postoperative changes of L4-S1 posterior spinal and interbody fusion. Hardware is intact. New mild compression deformity of the L1 vertebral body. Unchanged degenerative endplate sclerosis along the superior L2 endplate. IMPRESSION: New mild compression deformity of the L1 vertebral body. Electronically Signed   By: Orvan Falconer M.D.   On: 08/29/2022 15:26   DG Pelvis 1-2 Views  Result Date: 08/29/2022 CLINICAL DATA:  Larey Seat last night.  Found down. EXAM: PELVIS - 1-2 VIEW COMPARISON:  Abdominal film 01/29/2022 FINDINGS: Both hips are normally located. No acute hip fracture. The pubic symphysis and SI  joints are intact. No definite pelvic fractures. Lumbosacral fusion hardware noted. IMPRESSION: No acute bony findings. Electronically Signed   By: Rudie Meyer M.D.   On: 08/29/2022 15:26    Scheduled Meds:  cholestyramine light  4 g Oral BID   enoxaparin (LOVENOX) injection  30 mg Subcutaneous Q24H   feeding supplement  237 mL Oral BID BM   levothyroxine  75 mcg Oral Q0600   pantoprazole  40 mg Oral q morning   rOPINIRole  3 mg Oral QHS   Continuous Infusions:  dextrose 5 % and 0.9% NaCl       LOS: 0 days   Burnadette Pop, MD [facility] P4/09/2022, 7:36 AM

## 2022-08-30 NOTE — Evaluation (Signed)
Physical Therapy Evaluation Patient Details Name: [name] MRN: [id number] DOB: May 18, 1946 Today's Date: 08/30/2022  History of Present Illness  [name] is a 77 y.o. female with medical history significant of  hypothyroidism, GERD, RLS, CKD 3B (baseline creatinine of 1.7-1.9) who presents to the emergency department after sustaining a fall at home yesterday.  Patient was discharged 2 days ago from a nursing rehab facility to home after being admitted from 2/10 to 2/14 due to acute metabolic encephalopathy, rhabdomyolysis with dehydration, hypokalemia and diarrhea.  Patient fell yesterday at night and was unable to get up from the floor, her care provider was unable to get her off the floor, so patient ended up spending the night on the floor, son was able to get off the floor today and she was brought to the ED for further evaluation and management.  She complained of low back and pelvic pain and also complained of discomfort in her legs due to history of restless leg syndrome.  Son reported that patient's bottom was swollen and red, she denies chest pain, shortness of breath, fever, chills, palpitation, nausea or vomiting.    Clinical Impression  Patient limited for functional mobility as stated below secondary to BLE weakness, fatigue and poor standing balance. Patient requires assist to transition to seated EOB due to generalized weakness. Patient demonstrates good sitting balance and sitting tolerance at EOB. She requires mod to max assist to transfer to standing with RW and to remain standing secondary to strength and balance deficits. Patient again demonstrates good sitting tolerance while waiting at EOB for RN to assess flexiseal due to leaking. Assisted patient back to laying to get patient cleaned up. Patient left with nursing. Patient will benefit from continued physical therapy in hospital and recommended venue below to increase strength, balance, endurance for safe ADLs and gait.         Recommendations for follow up therapy are one component of a multi-disciplinary discharge planning process, led by the attending physician.  Recommendations may be updated based on patient status, additional functional criteria and insurance authorization.  Follow Up Recommendations Can patient physically be transported by private vehicle: No     Assistance Recommended at Discharge Frequent or constant Supervision/Assistance  Patient can return home with the following  A lot of help with walking and/or transfers;A lot of help with bathing/dressing/bathroom;Assistance with cooking/housework    Equipment Recommendations None recommended by PT  Recommendations for Other Services       Functional Status Assessment Patient has had a recent decline in their functional status and demonstrates the ability to make significant improvements in function in a reasonable and predictable amount of time.     Precautions / Restrictions Precautions Precautions: Fall Restrictions Weight Bearing Restrictions: No      Mobility  Bed Mobility Overal bed mobility: Needs Assistance Bed Mobility: Rolling, Supine to Sit, Sit to Supine Rolling: Min guard   Supine to sit: Mod assist, HOB elevated Sit to supine: Mod assist   General bed mobility comments: slow, labored, assist for BLE and to upright trunk    Transfers Overall transfer level: Needs assistance Equipment used: Rolling walker (2 wheels) Transfers: Sit to/from Stand Sit to Stand: Mod assist, Max assist                Ambulation/Gait                  Careers information officer  Modified Rankin (Stroke Patients Only)       Balance Overall balance assessment: Needs assistance Sitting-balance support: Feet supported, No upper extremity supported Sitting balance-Leahy Scale: Good     Standing balance support: Bilateral upper extremity supported, Reliant on assistive device for  balance Standing balance-Leahy Scale: Poor Standing balance comment: poor/zero with RW                             Pertinent Vitals/Pain Pain Assessment Pain Assessment: Faces Faces Pain Scale: Hurts even more Pain Location: buttock wounds Pain Descriptors / Indicators: Sore Pain Intervention(s): Limited activity within patient's tolerance, Monitored during session, Repositioned    Home Living Family/patient expects to be discharged to:: Private residence Living Arrangements: Children Available Help at Discharge: Family;Available 24 hours/day Type of Home: House       Alternate Level Stairs-Number of Steps: Patient states she does not go upstairs Home Layout: Two level;Able to live on main level with bedroom/bathroom Home Equipment: Rolling Walker (2 wheels);Wheelchair - Arboriculturist Comments: Risk analyst RW ADLs Comments: assisted by family     Hand Dominance   Dominant Hand: Right    Extremity/Trunk Assessment   Upper Extremity Assessment Upper Extremity Assessment: Generalized weakness    Lower Extremity Assessment Lower Extremity Assessment: Generalized weakness    Cervical / Trunk Assessment Cervical / Trunk Assessment: Normal  Communication   Communication: HOH  Cognition Arousal/Alertness: Awake/alert Behavior During Therapy: WFL for tasks assessed/performed Overall Cognitive Status: Within Functional Limits for tasks assessed                                          General Comments      Exercises     Assessment/Plan    PT Assessment Patient needs continued PT services  PT Problem List Decreased strength;Decreased activity tolerance;Decreased balance;Decreased mobility       PT Treatment Interventions DME instruction;Therapeutic exercise;Gait training;Balance training;Stair training;Neuromuscular  re-education;Functional mobility training;Therapeutic activities;Patient/family education    PT Goals (Current goals can be found in the Care Plan section)  Acute Rehab PT Goals Patient Stated Goal: return home PT Goal Formulation: With patient Time For Goal Achievement: 09/13/22 Potential to Achieve Goals: Good    Frequency Min 3X/week     Co-evaluation               AM-PAC PT "6 Clicks" Mobility  Outcome Measure Help needed turning from your back to your side while in a flat bed without using bedrails?: A Little Help needed moving from lying on your back to sitting on the side of a flat bed without using bedrails?: A Lot Help needed moving to and from a bed to a chair (including a wheelchair)?: A Lot Help needed standing up from a chair using your arms (e.g., wheelchair or bedside chair)?: A Lot Help needed to walk in hospital room?: A Lot Help needed climbing 3-5 steps with a railing? : A Lot 6 Click Score: 13    End of Session Equipment Utilized During Treatment: Gait belt Activity Tolerance: Patient limited by fatigue Patient left: in bed;with nursing/sitter in room;with call bell/phone within reach Nurse Communication: Mobility status PT Visit Diagnosis: Unsteadiness on feet (R26.81);Other abnormalities  of gait and mobility (R26.89);Muscle weakness (generalized) (M62.81)    Time: [id number] PT Time Calculation (min) (ACUTE ONLY): 58 min   Charges:   PT Evaluation $PT Eval Moderate Complexity: 1 Mod PT Treatments $Therapeutic Activity: 23-37 mins $Self Care/Home Management: 8-22        2:43 PM, 08/30/22 Wyman SongsterAndrew S. Linda Biehn PT, DPT [facility]

## 2022-08-30 NOTE — TOC Initial Note (Signed)
Transition of Care [facility]) - Initial/Assessment Note    Patient Details  Name: [name] MRN: [id number] Date of Birth: 03-May-1946  [facility] CM/SW Contact:    Elliot Gault, LCSW Phone Number: 08/30/2022, 3:42 PM  Clinical Narrative:                  Pt admitted from home. PT recommending SNF rehab. Pt was recently at [facility] for STR from mid February to end of March. Pt is eligible to return to [facility] under her previous three midnight stay as she has not been out of the SNF for more than 30 days.  Spoke with pt to review dc planning and PT recommendation. Pt reports that she would like to return to [facility] for more care.   Updated Heather at [facility] and referral sent. DC date not yet known.  TOC will follow  Expected Discharge Plan: Skilled Nursing Facility Barriers to Discharge: Continued Medical Work up   Patient Goals and CMS Choice Patient states their goals for this hospitalization and ongoing recovery are:: rehab again CMS Medicare.gov Compare Post Acute Care list provided to:: Patient Choice offered to / list presented to : Patient [place] ownership interest in [facility].provided to:: Patient    Expected Discharge Plan and Services In-house Referral: Clinical Social Work   Post Acute Care Choice: Skilled Nursing Facility Living arrangements for the past 2 months: Single Family Home                                      Prior Living Arrangements/Services Living arrangements for the past 2 months: Single Family Home Lives with:: Self Patient language and need for interpreter reviewed:: Yes Do you feel safe going back to the place where you live?: Yes      Need for Family Participation in Patient Care: Yes (Comment) Care giver support system in place?: No (comment) Current home services: DME Criminal Activity/Legal Involvement Pertinent to Current Situation/Hospitalization: No - Comment as  needed  Activities of Daily Living      Permission Sought/Granted Permission sought to share information with : Oceanographer granted to share information with : Yes, Verbal Permission Granted     Permission granted to share info w AGENCY: snfs        Emotional Assessment   Attitude/Demeanor/Rapport: Engaged Affect (typically observed): Pleasant Orientation: : Oriented to Self, Oriented to Place, Oriented to  Time, Oriented to Situation Alcohol / Substance Use: Not Applicable Psych Involvement: No (comment)  Admission diagnosis:  Dehydration [E86.0] Fall, initial encounter [W19.XXXA] Fall at home, initial encounter [phone].[id number], Y92.009] Pressure injury of skin of sacral region, unspecified injury stage [L89.159] Frequent falls [R29.6] Patient Active Problem List   Diagnosis Date Noted   Frequent falls 08/30/2022   Fall at home, initial encounter 08/29/2022   Low back pain 08/29/2022   Failure to thrive in adult 08/29/2022   Physical deconditioning 08/29/2022   Elevated CK 08/29/2022   Decubitus ulcer 08/29/2022   GERD (gastroesophageal reflux disease) 08/29/2022   Restless leg syndrome 08/29/2022   Rhabdomyolysis 07/07/2022   Dehydration 07/07/2022   Transaminitis 07/07/2022   Hypotension 07/07/2022   SIRS (systemic inflammatory response syndrome) 07/07/2022   Hypokalemia 07/07/2022   Stage 3b chronic kidney disease (CKD) 07/07/2022   Hypothermia 07/07/2022   Acute metabolic encephalopathy 07/06/2022   BMI 26.0-26.9,adult 05/06/2022   Atrial  fibrillation 04/12/2022   Congenital heart failure 04/12/2022   Malnutrition of moderate degree 01/14/2022   Respiratory failure requiring intubation 01/11/2022    Class: Acute   Septic shock 01/10/2022   Pressure injury of skin 01/10/2022   Small bowel obstruction 01/07/2022   Abnormal weight loss 11/28/2021   Family history of malignant neoplasm of digestive organs 11/28/2021   Intestinal  malabsorption 11/28/2021   Renal insufficiency 12/08/2020   Degeneration of lumbar intervertebral disc 01/07/2020   Venous (peripheral) insufficiency 09/30/2019   Anemia 08/12/2017   Vitamin D deficiency 08/12/2017   Lumbar spondylosis with myelopathy 06/03/2017   Primary osteoarthritis of both knees 03/19/2017   Anxiety 02/17/2017   History of pulmonary embolus (PE) 07/09/2016   COPD (chronic obstructive pulmonary disease) 01/18/2016   Mixed hyperlipidemia 01/18/2016   Depression 01/18/2016   Essential hypertension 01/18/2016   H/O spinal fusion 01/18/2016   Acquired hypothyroidism 01/18/2016   Thoracic aorta atherosclerosis 01/18/2016   Chronic cholecystitis with calculus 10/04/2013   PCP:  [name], FNP Pharmacy:   [facility] [place] - [address] [place] [address] Phone: [phone] Fax: [phone]     Social Determinants of Health (SDOH) Social History: SDOH Screenings   Food Insecurity: No Food Insecurity (07/07/2022)  Housing: Low Risk  (07/07/2022)  Transportation Needs: No Transportation Needs (07/07/2022)  Utilities: Not At Risk (07/07/2022)  Alcohol Screen: Low Risk  (11/28/2021)  Depression (PHQ2-9): Low Risk  (05/06/2022)  Financial Resource Strain: Low Risk  (11/28/2021)  Physical Activity: Inactive (11/28/2021)  Social Connections: Socially Integrated (11/28/2021)  Stress: Stress Concern Present (11/28/2021)  Tobacco Use: Low Risk  (08/29/2022)   SDOH Interventions:     Readmission Risk Interventions    08/30/2022    3:36 PM  Readmission Risk Prevention Plan  Transportation Screening Complete  PCP or Specialist Appt within 3-5 Days Complete  Social Work Consult for Recovery Care Planning/Counseling Complete  Palliative Care Screening Not Applicable  Medication Review Oceanographer) Complete

## 2022-08-31 DIAGNOSIS — Y92009 Unspecified place in unspecified non-institutional (private) residence as the place of occurrence of the external cause: Secondary | ICD-10-CM | POA: Diagnosis not present

## 2022-08-31 DIAGNOSIS — R748 Abnormal levels of other serum enzymes: Secondary | ICD-10-CM | POA: Diagnosis not present

## 2022-08-31 DIAGNOSIS — W19XXXA Unspecified fall, initial encounter: Secondary | ICD-10-CM | POA: Diagnosis not present

## 2022-08-31 LAB — URINALYSIS, ROUTINE W REFLEX MICROSCOPIC
Bilirubin Urine: NEGATIVE
Glucose, UA: NEGATIVE mg/dL
Hgb urine dipstick: NEGATIVE
Ketones, ur: NEGATIVE mg/dL
Nitrite: NEGATIVE
Protein, ur: NEGATIVE mg/dL
Specific Gravity, Urine: 1.012 (ref 1.005–1.030)
pH: 5 (ref 5.0–8.0)

## 2022-08-31 LAB — GASTROINTESTINAL PANEL BY PCR, STOOL (REPLACES STOOL CULTURE)

## 2022-08-31 LAB — BASIC METABOLIC PANEL WITH GFR
Anion gap: 5 (ref 5–15)
BUN: 29 mg/dL — ABNORMAL HIGH (ref 8–23)
CO2: 18 mmol/L — ABNORMAL LOW (ref 22–32)
Calcium: 8 mg/dL — ABNORMAL LOW (ref 8.9–10.3)
Chloride: 113 mmol/L — ABNORMAL HIGH (ref 98–111)
Creatinine, Ser: 1.35 mg/dL — ABNORMAL HIGH (ref 0.44–1.00)
GFR, Estimated: 41 mL/min — ABNORMAL LOW
Glucose, Bld: 84 mg/dL (ref 70–99)
Potassium: 3.5 mmol/L (ref 3.5–5.1)
Sodium: 136 mmol/L (ref 135–145)

## 2022-08-31 LAB — GLUCOSE, CAPILLARY: Glucose-Capillary: 82 mg/dL (ref 70–99)

## 2022-08-31 MED ORDER — VANCOMYCIN 50 MG/ML ORAL SOLUTION: 125.0000 mg | Freq: Four times a day (QID) | ORAL | Status: DC

## 2022-08-31 MED ORDER — VANCOMYCIN HCL 125 MG PO CAPS
125.0000 mg | ORAL_CAPSULE | Freq: Four times a day (QID) | ORAL | Status: DC
  Administered 2022-08-31 – 2022-09-04 (×17): 125 mg via ORAL
  Filled 2022-08-31 (×26): qty 1

## 2022-08-31 MED ORDER — CHLORHEXIDINE GLUCONATE CLOTH 2 % EX PADS
6.0000 | MEDICATED_PAD | Freq: Every day | CUTANEOUS | Status: DC
  Administered 2022-08-31 – 2022-09-03 (×4): 6 via TOPICAL

## 2022-08-31 NOTE — Progress Notes (Signed)
Patient with a lot of stool this shift, leaking around flexiseal but also quite a bit in the flexiseal bag. Patient sinz oxide placed on patient's reddened skin for protection. Sacral dressing dry/intact. Med honey in place. Heel dressings applied.

## 2022-08-31 NOTE — Progress Notes (Addendum)
PROGRESS NOTE  [name]  WJX:[id number] DOB: [birth year] DOA: 08/29/2022 PCP: [name], FNP   Brief Narrative:  Patient is a 77 year old female with history of hypothyroidism, GERD, restless leg syndrome, CKD stage IIIb with baseline creatinine of 1.7-1.9 who presents to the emergency department for the evaluation of fall at home.  She was just discharged from skilled nursing facility to home. She was admitted from 2/10 to 2/14 for acute metabolic encephalopathy, rhabdomyolysis, dehydration, hypokalemia and diarrhea.  Patient fell and was unable to get up from the floor. Lives with daughter. She also complained of low back pain, pelvic pain .  On presentation, she was hemodynamically stable.  Lab work showed WBC count of 10.8, creatinine 1.6, CK of 408.  CT lumbar spine without contrast, CT head without contrast, chest x-ray, pelvic x-ray did not show any acute findings.  Lumbar spine x-ray showed new mild compression deformity of L1 vertebral body. C diff came out to be positive, started on oral vancomycin.  PT/OT recommending SNF on discharge.  Assessment & Plan:  Principal Problem:   Fall at home, initial encounter Active Problems:   Mixed hyperlipidemia   Acquired hypothyroidism   Dehydration   Low back pain   Failure to thrive in adult   Physical deconditioning   Elevated CK   Decubitus ulcer   GERD (gastroesophageal reflux disease)   Restless leg syndrome   Frequent falls  Fall: Fell at home.  Recently discharged from skilled nursing facility.  Chronic ambulatory dysfunction.  PT/OT evaluation done,recommended SNF  C diff diarrhea: Reported to have  loose bowel movements in the ED.  Rectal tube placed.  C. difficile came out to be positive.  Started on oral vancomycin.  Her abdomen is benign: soft and nondistended.  Denies any abdomen pain.  Can use imodium for severe diarrhea  Elevated CK: Likely from rhabdomyolysis from fall.  Continue gentle IV fluids.  Sacral  decubitus ulcer: Wound care consulted.Doesnot appear to be infected  Mild compression fracture of L1 vertebral body: Continue supportive care.  Currently she denies any back pain.  Will order TLSO brace if pain worsens  Non-anion gap metabolic acidosis: CO2 is 18.  Likely associated with diarrhea.  Started on sodium bicarb tablets  Hypothyroidism: Continue Synthyroid  GERD: Continue Protonix  RLS: Continue Requip  Moderate protein calorie moderation: Albumin of 2.6.  Nutritionist consulted and following  CKD stage IIIb: Baseline creatinine ranges from 1.7-1.9.  Currently kidney function at baseline    Nutrition Problem: Moderate Malnutrition Etiology: altered GI function (chronic diarrhea) Pressure Injury 01/10/22 Coccyx Medial;Mid Unstageable - Full thickness tissue loss in which the base of the injury is covered by slough (yellow, tan, gray, green or brown) and/or eschar (tan, brown or black) in the wound bed. dark brown, black in color (Active)  01/10/22 1200  Location: Coccyx  Location Orientation: Medial;Mid  Staging: Unstageable - Full thickness tissue loss in which the base of the injury is covered by slough (yellow, tan, gray, green or brown) and/or eschar (tan, brown or black) in the wound bed.  Wound Description (Comments): dark brown, black in color  Present on Admission: Yes  Dressing Type Foam - Lift dressing to assess site every shift 08/30/22 2329     Pressure Injury 08/30/22 Heel Left Deep Tissue Pressure Injury - Purple or maroon localized area of discolored intact skin or blood-filled blister due to damage of underlying soft tissue from pressure and/or shear. (Active)  08/30/22 1441  Location: Heel  Location  Orientation: Left  Staging: Deep Tissue Pressure Injury - Purple or maroon localized area of discolored intact skin or blood-filled blister due to damage of underlying soft tissue from pressure and/or shear.  Wound Description (Comments):   Present on  Admission:   Dressing Type Foam - Lift dressing to assess site every shift 08/30/22 2329    DVT prophylaxis:enoxaparin (LOVENOX) injection 30 mg Start: 08/29/22 2200 SCDs Start: 08/29/22 2010     Code Status: DNR  Family Communication: Called and discussed with son on phone on 4/6  Patient status: Inpatient  Patient is from : Inpatient  Anticipated discharge to: Home  Estimated DC date: SNF   Consultants: None  Procedures: None  Antimicrobials:  Anti-infectives (From admission, onward)    Start     Dose/Rate Route Frequency Ordered Stop   08/31/22 1000  vancomycin (VANCOCIN) capsule 125 mg        125 mg Oral 4 times daily 08/31/22 0041 09/10/22 0959   08/31/22 0830  vancomycin (VANCOCIN) 50 mg/mL oral solution SOLN 125 mg  Status:  Discontinued        125 mg Oral Every 6 hours 08/31/22 0744 08/31/22 0744       Subjective: Patient seen and examined at bedside today.  Her diarrhea might have been little better today.  She denies any abdomen pain, nausea or vomiting.  Back pain is well-controlled.  She is agreeable to be discharged to SNF when she is ready.  Denies new complaints today.  Objective: Vitals:   08/30/22 0506 08/30/22 1249 08/30/22 2011 08/31/22 0517  BP: 139/70 130/68 109/65 123/68  Pulse: 69 71 (!) 58 71  Resp: 14 16 16 16   Temp: 98.6 F (37 C) 97.6 F (36.4 C) 97.6 F (36.4 C) 97.9 F (36.6 C)  TempSrc: Axillary Axillary Oral Oral  SpO2: 98% 99% 100% 99%  Weight:    61.2 kg  Height:        Intake/Output Summary (Last 24 hours) at 08/31/2022 1113 Last data filed at 08/31/2022 0518 Gross per 24 hour  Intake 309.54 ml  Output 1000 ml  Net -690.46 ml   Filed Weights   08/29/22 1411 08/29/22 2102 08/31/22 0517  Weight: 72.6 kg 58.5 kg 61.2 kg    Examination:  General exam: Overall comfortable, not in distress, deconditioned HEENT: PERRL Respiratory system:  no wheezes or crackles  Cardiovascular system: S1 & S2 heard, RRR.   Gastrointestinal system: Abdomen is nondistended, soft and nontender. Central nervous system: Alert and oriented Extremities: No edema, no clubbing ,no cyanosis Skin: Scattered ecchymosis, sacral pressure ulcer GU: Rectal tube  Data Reviewed: I have personally reviewed following labs and imaging studies  CBC: Recent Labs  Lab 08/29/22 1548 08/30/22 0402  WBC 10.8* 8.5  NEUTROABS 8.9*  --   HGB 11.6* 9.5*  HCT 36.1 30.5*  MCV 98.9 102.0*  PLT 345 281   Basic Metabolic Panel: Recent Labs  Lab 08/29/22 1548 08/30/22 0402 08/31/22 0757  NA 135 139 136  K 4.5 4.0 3.5  CL 109 114* 113*  CO2 18* 18* 18*  GLUCOSE 86 69* 84  BUN 35* 36* 29*  CREATININE 1.60* 1.50* 1.35*  CALCIUM 8.8* 8.5* 8.0*  MG  --  1.7  --   PHOS  --  3.9  --      Recent Results (from the past 240 hour(s))  C Difficile Quick Screen w PCR reflex     Status: Abnormal   Collection Time: 08/30/22 11:00 AM  Specimen: Stool  Result Value Ref Range Status   C Diff antigen POSITIVE (A) NEGATIVE Final   C Diff toxin NEGATIVE NEGATIVE Final   C Diff interpretation Results are indeterminate. See PCR results.  Final    Comment: Performed at [facility], [address], [place], [place] [address]  C. Diff by PCR, Reflexed     Status: Abnormal   Collection Time: 08/30/22 11:00 AM  Result Value Ref Range Status   Toxigenic C. Difficile by PCR POSITIVE (A) NEGATIVE Final    Comment: Positive for toxigenic C. difficile with little to no toxin production. Only treat if clinical presentation suggests symptomatic illness. Performed at [facility], [address], [place], [place] [address]      Radiology Studies: CT Lumbar Spine Wo Contrast  Result Date: 08/29/2022 CLINICAL DATA:  Patient fell last night at home found by brother on floor today. Lower back pain. EXAM: CT LUMBAR SPINE WITHOUT CONTRAST TECHNIQUE: Multidetector CT imaging of the lumbar spine was performed without intravenous contrast  administration. Multiplanar CT image reconstructions were also generated. RADIATION DOSE REDUCTION: This exam was performed according to the departmental dose-optimization program which includes automated exposure control, adjustment of the mA and/or kV according to patient size and/or use of iterative reconstruction technique. COMPARISON:  Radiographs 08/29/2022 and CT abdomen and pelvis 01/09/2022 FINDINGS: Segmentation: 5 lumbar type vertebrae. Alignment: Grade 1 anterolisthesis L4. No evidence of traumatic malalignment. Vertebrae: No acute fracture or focal pathologic process. The L1 vertebral body height loss seen on radiograph earlier today was likely artifactual due to overlapping structures as the L1 vertebral body demonstrates normal height and appears similar to 01/09/2022. Paraspinal and other soft tissues: No acute abnormality. Aortic calcification. Disc levels: Posterior fusion L4-S1 with interbody spacers at L4-L5 and L5-S1. Multilevel spondylosis, disc space height loss, and degenerative endplate changes greatest at T12-L1 where it is moderate. Lower lumbar facet arthropathy. Streak artifact from the lumbar fusion degrades evaluation of the spinal canal and neural foramina at L4-S1. No high-grade spinal canal narrowing. IMPRESSION: No acute fracture or evidence of traumatic malalignment in the lumbar spine. Electronically Signed   By: Minerva Fester M.D.   On: 08/29/2022 18:56   CT Head Wo Contrast  Result Date: 08/29/2022 CLINICAL DATA:  Fall.  Head trauma. EXAM: CT HEAD WITHOUT CONTRAST TECHNIQUE: Contiguous axial images were obtained from the base of the skull through the vertex without intravenous contrast. RADIATION DOSE REDUCTION: This exam was performed according to the departmental dose-optimization program which includes automated exposure control, adjustment of the mA and/or kV according to patient size and/or use of iterative reconstruction technique. COMPARISON:  Head CT 07/06/2022.  FINDINGS: Brain: No acute hemorrhage. Unchanged chronic small-vessel disease. Cortical gray-white differentiation is otherwise preserved. Prominence of the ventricles and sulci within normal limits for age. No extra-axial collection. Basilar cisterns are patent. Vascular: No hyperdense vessel or unexpected calcification. Skull: No calvarial fracture or suspicious bone lesion. Skull base is unremarkable. Sinuses/Orbits: Mucous retention cysts in the left maxillary sinus. Orbits are unremarkable. Mastoids are well aerated. Other: None. IMPRESSION: No acute intracranial abnormality. Electronically Signed   By: Orvan Falconer M.D.   On: 08/29/2022 15:42   DG Thoracic Spine 2 View  Result Date: 08/29/2022 CLINICAL DATA:  Fall. EXAM: THORACIC SPINE 2 VIEWS COMPARISON:  Chest radiograph 01/22/2022. FINDINGS: Two views. There is no evidence of thoracic spine fracture. Alignment is normal. Multilevel degenerative changes with calcified discs and flowing anterior osteophytes. IMPRESSION: No evidence of  thoracic spine fracture or traumatic malalignment. Electronically Signed   By: Orvan FalconerWalter  Wiggins M.D.   On: 08/29/2022 15:29   DG Lumbar Spine Complete  Result Date: 08/29/2022 CLINICAL DATA:  Fall. EXAM: LUMBAR SPINE - COMPLETE 4+ VIEW COMPARISON:  MRI lumbar spine 12/27/2019. FINDINGS: Five views. Postoperative changes of L4-S1 posterior spinal and interbody fusion. Hardware is intact. New mild compression deformity of the L1 vertebral body. Unchanged degenerative endplate sclerosis along the superior L2 endplate. IMPRESSION: New mild compression deformity of the L1 vertebral body. Electronically Signed   By: Orvan FalconerWalter  Wiggins M.D.   On: 08/29/2022 15:26   DG Pelvis 1-2 Views  Result Date: 08/29/2022 CLINICAL DATA:  Larey SeatFell last night.  Found down. EXAM: PELVIS - 1-2 VIEW COMPARISON:  Abdominal film 01/29/2022 FINDINGS: Both hips are normally located. No acute hip fracture. The pubic symphysis and SI joints are intact. No  definite pelvic fractures. Lumbosacral fusion hardware noted. IMPRESSION: No acute bony findings. Electronically Signed   By: Rudie MeyerP.  Gallerani M.D.   On: 08/29/2022 15:26    Scheduled Meds:  Chlorhexidine Gluconate Cloth  6 each Topical Daily   cholestyramine light  4 g Oral BID   enoxaparin (LOVENOX) injection  30 mg Subcutaneous Q24H   feeding supplement  237 mL Oral BID BM   leptospermum manuka honey  1 Application Topical Daily   levothyroxine  75 mcg Oral Q0600   liver oil-zinc oxide   Topical TID   nutrition supplement (JUVEN)  1 packet Oral BID BM   pantoprazole  40 mg Oral q morning   rOPINIRole  3 mg Oral QHS   sodium bicarbonate  650 mg Oral BID   vancomycin  125 mg Oral QID   Continuous Infusions:  sodium chloride 100 mL/hr at 08/31/22 0922     LOS: 1 day   Burnadette PopAmrit Quin Mcpherson, MD [facility] P4/10/2022, 11:13 AM

## 2022-08-31 NOTE — Progress Notes (Signed)
Patient's C.Diff was positive.  She was started on oral vancomycin

## 2022-09-01 DIAGNOSIS — Y92009 Unspecified place in unspecified non-institutional (private) residence as the place of occurrence of the external cause: Secondary | ICD-10-CM | POA: Diagnosis not present

## 2022-09-01 DIAGNOSIS — R748 Abnormal levels of other serum enzymes: Secondary | ICD-10-CM | POA: Diagnosis not present

## 2022-09-01 DIAGNOSIS — W19XXXA Unspecified fall, initial encounter: Secondary | ICD-10-CM | POA: Diagnosis not present

## 2022-09-01 LAB — BASIC METABOLIC PANEL
Anion gap: 3 — ABNORMAL LOW (ref 5–15)
BUN: 32 mg/dL — ABNORMAL HIGH (ref 8–23)
CO2: 19 mmol/L — ABNORMAL LOW (ref 22–32)
Calcium: 8 mg/dL — ABNORMAL LOW (ref 8.9–10.3)
Chloride: 114 mmol/L — ABNORMAL HIGH (ref 98–111)
Creatinine, Ser: 1.32 mg/dL — ABNORMAL HIGH (ref 0.44–1.00)
GFR, Estimated: 42 mL/min — ABNORMAL LOW (ref >60)
Glucose, Bld: 80 mg/dL (ref 70–99)
Potassium: 2.8 mmol/L — ABNORMAL LOW (ref 3.5–5.1)
Sodium: 136 mmol/L (ref 135–145)

## 2022-09-01 LAB — POTASSIUM: Potassium: 4.1 mmol/L (ref 3.5–5.1)

## 2022-09-01 LAB — GLUCOSE, CAPILLARY: Glucose-Capillary: 81 mg/dL (ref 70–99)

## 2022-09-01 MED ORDER — POTASSIUM CHLORIDE 20 MEQ PO PACK
40.0000 meq | PACK | ORAL | Status: AC
  Administered 2022-09-01 (×2): 40 meq via ORAL
  Filled 2022-09-01 (×2): qty 2

## 2022-09-01 NOTE — Progress Notes (Signed)
Flexiseal removed @ 1300 Pt tolerated well. Bed pads changed. Pericare given. Tylenol given. Barrier creme applied to sacral area.

## 2022-09-01 NOTE — Progress Notes (Signed)
PROGRESS NOTE  [name]  BZJ:[id number] DOB: [birth year] DOA: 08/29/2022 PCP: [name], FNP   Brief Narrative:  Patient is a 77 year old female with history of hypothyroidism, GERD, restless leg syndrome, CKD stage IIIb with baseline creatinine of 1.7-1.9 who presents to the emergency department for the evaluation of fall at home.  She was just discharged from skilled nursing facility to home. She was admitted from 2/10 to 2/14 for acute metabolic encephalopathy, rhabdomyolysis, dehydration, hypokalemia and diarrhea.  Patient fell and was unable to get up from the floor. Lives with daughter. She also complained of low back pain, pelvic pain .  On presentation, she was hemodynamically stable.  Lab work showed WBC count of 10.8, creatinine 1.6, CK of 408.  CT lumbar spine without contrast, CT head without contrast, chest x-ray, pelvic x-ray did not show any acute findings.  Lumbar spine x-ray showed new mild compression deformity of L1 vertebral body. C diff came out to be positive, started on oral vancomycin.  PT/OT recommending SNF on discharge.  Still having significant diarrhea.  Assessment & Plan:  Principal Problem:   Fall at home, initial encounter Active Problems:   Mixed hyperlipidemia   Acquired hypothyroidism   Dehydration   Low back pain   Failure to thrive in adult   Physical deconditioning   Elevated CK   Decubitus ulcer   GERD (gastroesophageal reflux disease)   Restless leg syndrome   Frequent falls  Fall: Fell at home.  Recently discharged from skilled nursing facility.  Chronic ambulatory dysfunction.  PT/OT evaluation done,recommended SNF  C diff diarrhea: Reported to have  loose bowel movements in the ED.  Rectal tube placed.  C. difficile came out to be positive.  Started on oral vancomycin.  Her abdomen is benign: soft and nondistended.  Denies any abdomen pain.  Can use imodium for severe diarrhea.  Diarrhea still consistent.  Continue gentle IV  fluids  Elevated CK: Likely from rhabdomyolysis from fall.  Continue gentle IV fluids.  Sacral decubitus ulcer: Wound care consulted.Doesnot appear to be infected  Mild compression fracture of L1 vertebral body: Continue supportive care.  Currently she denies any back pain.  Will order TLSO brace if pain worsens  Non-anion gap metabolic acidosis: CO2 is 18.  Likely associated with diarrhea.  Started on sodium bicarb tablets  Hypothyroidism: Continue Synthyroid  GERD: Continue Protonix  RLS: Continue Requip  Moderate protein calorie moderation: Albumin of 2.6.  Nutritionist consulted and following  CKD stage IIIb: Baseline creatinine ranges from 1.7-1.9.  Currently kidney function at baseline    Nutrition Problem: Moderate Malnutrition Etiology: altered GI function (chronic diarrhea) Pressure Injury 01/10/22 Coccyx Medial;Mid Unstageable - Full thickness tissue loss in which the base of the injury is covered by slough (yellow, tan, gray, green or brown) and/or eschar (tan, brown or black) in the wound bed. dark brown, black in color (Active)  01/10/22 1200  Location: Coccyx  Location Orientation: Medial;Mid  Staging: Unstageable - Full thickness tissue loss in which the base of the injury is covered by slough (yellow, tan, gray, green or brown) and/or eschar (tan, brown or black) in the wound bed.  Wound Description (Comments): dark brown, black in color  Present on Admission: Yes  Dressing Type Foam - Lift dressing to assess site every shift 09/01/22 0903     Pressure Injury 08/30/22 Heel Left Deep Tissue Pressure Injury - Purple or maroon localized area of discolored intact skin or blood-filled blister due to damage of underlying soft  tissue from pressure and/or shear. (Active)  08/30/22 1441  Location: Heel  Location Orientation: Left  Staging: Deep Tissue Pressure Injury - Purple or maroon localized area of discolored intact skin or blood-filled blister due to damage of  underlying soft tissue from pressure and/or shear.  Wound Description (Comments):   Present on Admission:   Dressing Type Foam - Lift dressing to assess site every shift 09/01/22 0903    DVT prophylaxis:enoxaparin (LOVENOX) injection 30 mg Start: 08/29/22 2200 SCDs Start: 08/29/22 2010     Code Status: DNR  Family Communication: Called and discussed with son on phone on 4/6  Patient status: Inpatient  Patient is from : Home  Anticipated discharge to: SNF  Estimated DC date: 1 to 2 days, need to wait for improvement of diarrhea before discharge   Consultants: None  Procedures: None  Antimicrobials:  Anti-infectives (From admission, onward)    Start     Dose/Rate Route Frequency Ordered Stop   08/31/22 1000  vancomycin (VANCOCIN) capsule 125 mg        125 mg Oral 4 times daily 08/31/22 0041 09/10/22 0959   08/31/22 0830  vancomycin (VANCOCIN) 50 mg/mL oral solution SOLN 125 mg  Status:  Discontinued        125 mg Oral Every 6 hours 08/31/22 0744 08/31/22 0744       Subjective: Patient seen and examined at bedside today.  Hemodynamically stable.  Still having significant diarrhea on rectal tube.  Denies abdominal pain, nausea or vomiting.  Objective: Vitals:   08/31/22 0517 08/31/22 1409 08/31/22 2158 09/01/22 0605  BP: 123/68 (!) 110/55 115/68 130/86  Pulse: 71 75 63 68  Resp: 16  18 14   Temp: 97.9 F (36.6 C) 99.1 F (37.3 C) 98.8 F (37.1 C) 98.4 F (36.9 C)  TempSrc: Oral Oral    SpO2: 99% 100% 99% 100%  Weight: 61.2 kg   66.2 kg  Height:        Intake/Output Summary (Last 24 hours) at 09/01/2022 1150 Last data filed at 09/01/2022 6659 Gross per 24 hour  Intake 240 ml  Output 785 ml  Net -545 ml   Filed Weights   08/29/22 2102 08/31/22 0517 09/01/22 0605  Weight: 58.5 kg 61.2 kg 66.2 kg    Examination:  General exam: Overall comfortable, not in distress, weak, deconditioned HEENT: PERRL Respiratory system:  no wheezes or crackles   Cardiovascular system: S1 & S2 heard, RRR.  Gastrointestinal system: Abdomen is nondistended, soft and nontender. Central nervous system: Alert and oriented Extremities: No edema, no clubbing ,no cyanosis Skin: Scattered ecchymosis, sacral pressure ulcer GU: Rectal tube  Data Reviewed: I have personally reviewed following labs and imaging studies  CBC: Recent Labs  Lab 08/29/22 1548 08/30/22 0402  WBC 10.8* 8.5  NEUTROABS 8.9*  --   HGB 11.6* 9.5*  HCT 36.1 30.5*  MCV 98.9 102.0*  PLT 345 281   Basic Metabolic Panel: Recent Labs  Lab 08/29/22 1548 08/30/22 0402 08/31/22 0757 09/01/22 0352  NA 135 139 136 136  K 4.5 4.0 3.5 2.8*  CL 109 114* 113* 114*  CO2 18* 18* 18* 19*  GLUCOSE 86 69* 84 80  BUN 35* 36* 29* 32*  CREATININE 1.60* 1.50* 1.35* 1.32*  CALCIUM 8.8* 8.5* 8.0* 8.0*  MG  --  1.7  --   --   PHOS  --  3.9  --   --      Recent Results (from the past 240 hour(s))  C Difficile Quick Screen w PCR reflex     Status: Abnormal   Collection Time: 08/30/22 11:00 AM   Specimen: Stool  Result Value Ref Range Status   C Diff antigen POSITIVE (A) NEGATIVE Final   C Diff toxin NEGATIVE NEGATIVE Final   C Diff interpretation Results are indeterminate. See PCR results.  Final    Comment: Performed at [facility], [address] [place], [place] [address]  Gastrointestinal Panel by PCR , Stool     Status: None   Collection Time: 08/30/22 11:00 AM   Specimen: Stool  Result Value Ref Range Status   Campylobacter species NOT DETECTED NOT DETECTED Final   Plesimonas shigelloides NOT DETECTED NOT DETECTED Final   Salmonella species NOT DETECTED NOT DETECTED Final   Yersinia enterocolitica NOT DETECTED NOT DETECTED Final   Vibrio species NOT DETECTED NOT DETECTED Final   Vibrio cholerae NOT DETECTED NOT DETECTED Final   Enteroaggregative E coli (EAEC) NOT DETECTED NOT DETECTED Final   Enteropathogenic E coli (EPEC) NOT DETECTED NOT DETECTED Final   Enterotoxigenic E  coli (ETEC) NOT DETECTED NOT DETECTED Final   Shiga like toxin producing E coli (STEC) NOT DETECTED NOT DETECTED Final   Shigella/Enteroinvasive E coli (EIEC) NOT DETECTED NOT DETECTED Final   Cryptosporidium NOT DETECTED NOT DETECTED Final   Cyclospora cayetanensis NOT DETECTED NOT DETECTED Final   Entamoeba histolytica NOT DETECTED NOT DETECTED Final   Giardia lamblia NOT DETECTED NOT DETECTED Final   Adenovirus F40/41 NOT DETECTED NOT DETECTED Final   Astrovirus NOT DETECTED NOT DETECTED Final   Norovirus GI/GII NOT DETECTED NOT DETECTED Final   Rotavirus A NOT DETECTED NOT DETECTED Final   Sapovirus (I, II, IV, and V) NOT DETECTED NOT DETECTED Final    Comment: Performed at [facility], [address] [place], [place] [address]  C. Diff by PCR, Reflexed     Status: Abnormal   Collection Time: 08/30/22 11:00 AM  Result Value Ref Range Status   Toxigenic C. Difficile by PCR POSITIVE (A) NEGATIVE Final    Comment: Positive for toxigenic C. difficile with little to no toxin production. Only treat if clinical presentation suggests symptomatic illness. Performed at [facility], [address] [place], [place] [address]      Radiology Studies: No results found.  Scheduled Meds:  Chlorhexidine Gluconate Cloth  6 each Topical Daily   cholestyramine light  4 g Oral BID   enoxaparin (LOVENOX) injection  30 mg Subcutaneous Q24H   leptospermum manuka honey  1 Application Topical Daily   levothyroxine  75 mcg Oral Q0600   liver oil-zinc oxide   Topical TID   nutrition supplement (JUVEN)  1 packet Oral BID BM   pantoprazole  40 mg Oral q morning   potassium chloride  40 mEq Oral Q3H   rOPINIRole  3 mg Oral QHS   sodium bicarbonate  650 mg Oral BID   vancomycin  125 mg Oral QID   Continuous Infusions:  sodium chloride 75 mL/hr at 08/31/22 2334     LOS: 2 days   Burnadette PopAmrit Algie Westry, MD [facility] P4/11/2022, 11:50 AM

## 2022-09-02 LAB — CBC
HCT: 29.8 % — ABNORMAL LOW (ref 36.0–46.0)
Hemoglobin: 9.1 g/dL — ABNORMAL LOW (ref 12.0–15.0)
MCH: 31.2 pg (ref 26.0–34.0)
MCHC: 30.5 g/dL (ref 30.0–36.0)
MCV: 102.1 fL — ABNORMAL HIGH (ref 80.0–100.0)
Platelets: 247 10*3/uL (ref 150–400)
RBC: 2.92 MIL/uL — ABNORMAL LOW (ref 3.87–5.11)
RDW: 14.4 % (ref 11.5–15.5)
WBC: 6.9 10*3/uL (ref 4.0–10.5)
nRBC: 0 % (ref 0.0–0.2)

## 2022-09-02 LAB — BASIC METABOLIC PANEL
Anion gap: 3 — ABNORMAL LOW (ref 5–15)
BUN: 32 mg/dL — ABNORMAL HIGH (ref 8–23)
CO2: 18 mmol/L — ABNORMAL LOW (ref 22–32)
Calcium: 7.9 mg/dL — ABNORMAL LOW (ref 8.9–10.3)
Chloride: 113 mmol/L — ABNORMAL HIGH (ref 98–111)
Creatinine, Ser: 1.3 mg/dL — ABNORMAL HIGH (ref 0.44–1.00)
GFR, Estimated: 43 mL/min — ABNORMAL LOW (ref >60)
Glucose, Bld: 73 mg/dL (ref 70–99)
Potassium: 3.4 mmol/L — ABNORMAL LOW (ref 3.5–5.1)
Sodium: 134 mmol/L — ABNORMAL LOW (ref 135–145)

## 2022-09-02 LAB — GLUCOSE, CAPILLARY: Glucose-Capillary: 80 mg/dL (ref 70–99)

## 2022-09-02 MED ORDER — POTASSIUM CHLORIDE 20 MEQ PO PACK
40.0000 meq | PACK | Freq: Once | ORAL | Status: AC
  Administered 2022-09-02: 40 meq via ORAL
  Filled 2022-09-02: qty 2

## 2022-09-02 MED ORDER — DIPHENOXYLATE-ATROPINE 2.5-0.025 MG PO TABS
1.0000 | ORAL_TABLET | Freq: Four times a day (QID) | ORAL | Status: DC | PRN
  Administered 2022-09-02: 1 via ORAL
  Filled 2022-09-02: qty 1

## 2022-09-02 MED ORDER — NYSTATIN 100000 UNIT/GM EX POWD
Freq: Three times a day (TID) | CUTANEOUS | Status: DC
  Filled 2022-09-02 (×3): qty 15

## 2022-09-02 MED ORDER — SODIUM BICARBONATE 650 MG PO TABS
650.0000 mg | ORAL_TABLET | Freq: Three times a day (TID) | ORAL | Status: DC
  Administered 2022-09-02 – 2022-09-04 (×6): 650 mg via ORAL
  Filled 2022-09-02 (×6): qty 1

## 2022-09-02 MED ORDER — LOPERAMIDE HCL 2 MG PO CAPS
2.0000 mg | ORAL_CAPSULE | Freq: Four times a day (QID) | ORAL | Status: DC | PRN
  Administered 2022-09-02: 2 mg via ORAL
  Filled 2022-09-02: qty 1

## 2022-09-02 NOTE — TOC Progression Note (Addendum)
Transition of Care [facility]) - Progression Note    Patient Details  Name: [name] MRN: [id number] Date of Birth: [birth year]  Transition of Care [facility]) CM/SW Contact  Leitha Bleak, RN Phone Number: 09/02/2022, 11:15 AM  Clinical Narrative:   Katherina Right at [facility], patient will discharge tomorrow, they do have a private room. They do have a private room. No TLSO needed.  Expected Discharge Plan: Skilled Nursing Facility Barriers to Discharge: Continued Medical Work up  Expected Discharge Plan and Services In-house Referral: Clinical Social Work   Post Acute Care Choice: Skilled Nursing Facility Living arrangements for the past 2 months: Single Family Home

## 2022-09-02 NOTE — Plan of Care (Signed)
Patient AOX4, VSS throughout shift.  All meds given on time as ordered.  Pt denied pain and SOB.  Diminished lungs, IS encouraged.  Pt had 3 Bms o/n.  Foley care provided with CHG bath.  Full linen changed.  Fluids on-going.  POC maintained, will continue to monitor.  Problem: Education: Goal: Knowledge of General Education information will improve Description: Including pain rating scale, medication(s)/side effects and non-pharmacologic comfort measures Outcome: Progressing   Problem: Health Behavior/Discharge Planning: Goal: Ability to manage health-related needs will improve Outcome: Progressing   Problem: Clinical Measurements: Goal: Ability to maintain clinical measurements within normal limits will improve Outcome: Progressing Goal: Will remain free from infection Outcome: Progressing Goal: Diagnostic test results will improve Outcome: Progressing Goal: Respiratory complications will improve Outcome: Progressing Goal: Cardiovascular complication will be avoided Outcome: Progressing   Problem: Activity: Goal: Risk for activity intolerance will decrease Outcome: Progressing   Problem: Nutrition: Goal: Adequate nutrition will be maintained Outcome: Progressing   Problem: Coping: Goal: Level of anxiety will decrease Outcome: Progressing   Problem: Elimination: Goal: Will not experience complications related to bowel motility Outcome: Progressing Goal: Will not experience complications related to urinary retention Outcome: Progressing   Problem: Pain Managment: Goal: General experience of comfort will improve Outcome: Progressing   Problem: Safety: Goal: Ability to remain free from injury will improve Outcome: Progressing   Problem: Skin Integrity: Goal: Risk for impaired skin integrity will decrease Outcome: Progressing

## 2022-09-02 NOTE — Progress Notes (Addendum)
PROGRESS NOTE  [name]  SEG:[id number] DOB: Feb 02, 1946 DOA: 08/29/2022 PCP: [name], FNP   Brief Narrative:  Patient is a 77 year old female with history of hypothyroidism, GERD, restless leg syndrome, CKD stage IIIb with baseline creatinine of 1.7-1.9 who presents to the emergency department for the evaluation of fall at home.  She was just discharged from skilled nursing facility to home. She was admitted from 2/10 to 2/14 for acute metabolic encephalopathy, rhabdomyolysis, dehydration, hypokalemia and diarrhea.  Patient fell and was unable to get up from the floor. Lives with daughter. She also complained of low back pain, pelvic pain .  On presentation, she was hemodynamically stable.  Lab work showed WBC count of 10.8, creatinine 1.6, CK of 408.  CT lumbar spine without contrast, CT head without contrast, chest x-ray, pelvic x-ray did not show any acute findings.  Lumbar spine x-ray showed new mild compression deformity of L1 vertebral body. C diff came out to be positive, started on oral vancomycin.  PT/OT recommending SNF on discharge.  Diarrhea now slowing down, plan for discharge tomorrow to SNF if further improvement in diarrhea  Assessment & Plan:  Principal Problem:   Fall at home, initial encounter Active Problems:   Mixed hyperlipidemia   Acquired hypothyroidism   Dehydration   Low back pain   Failure to thrive in adult   Physical deconditioning   Elevated CK   Decubitus ulcer   GERD (gastroesophageal reflux disease)   Restless leg syndrome   Frequent falls  Fall: Fell at home.  Recently discharged from skilled nursing facility.  Chronic ambulatory dysfunction.  PT/OT evaluation done,recommended SNF  C diff diarrhea: Reported to have  loose bowel movements in the ED.  Rectal tube placed.  C. difficile came out to be positive.  Started on oral vancomycin.  Her abdomen is benign: soft and nondistended.  Denies any abdomen pain.  Can use imodium for severe  diarrhea.  Diarrhea still ongoing but still becoming more firm now.  Continue gentle IV fluids for today  Hypokalemia: Being monitored and supplemented  Elevated CK: Likely from rhabdomyolysis from fall.  Continue gentle IV fluids.  Sacral decubitus ulcer: Wound care consulted.Doesnot appear to be infected  Mild compression fracture of L1 vertebral body: Continue supportive care.  Currently she denies any back pain.  Will order TLSO brace if pain worsens  Non-anion gap metabolic acidosis: CO2 is 18.  Likely associated with diarrhea.  Started on sodium bicarb tablets  Hypothyroidism: Continue Synthyroid  GERD: Continue Protonix  RLS: Continue Requip  Moderate protein calorie moderation: Albumin of 2.6.  Nutritionist consulted and following  CKD stage IIIb: Baseline creatinine ranges from 1.7-1.9.  Currently kidney function at baseline    Nutrition Problem: Moderate Malnutrition Etiology: altered GI function (chronic diarrhea) Pressure Injury 01/10/22 Coccyx Medial;Mid Unstageable - Full thickness tissue loss in which the base of the injury is covered by slough (yellow, tan, gray, green or brown) and/or eschar (tan, brown or black) in the wound bed. dark brown, black in color (Active)  01/10/22 1200  Location: Coccyx  Location Orientation: Medial;Mid  Staging: Unstageable - Full thickness tissue loss in which the base of the injury is covered by slough (yellow, tan, gray, green or brown) and/or eschar (tan, brown or black) in the wound bed.  Wound Description (Comments): dark brown, black in color  Present on Admission: Yes  Dressing Type Foam - Lift dressing to assess site every shift;Honey 09/02/22 0900     Pressure Injury 08/30/22  Heel Left Deep Tissue Pressure Injury - Purple or maroon localized area of discolored intact skin or blood-filled blister due to damage of underlying soft tissue from pressure and/or shear. (Active)  08/30/22 1441  Location: Heel  Location  Orientation: Left  Staging: Deep Tissue Pressure Injury - Purple or maroon localized area of discolored intact skin or blood-filled blister due to damage of underlying soft tissue from pressure and/or shear.  Wound Description (Comments):   Present on Admission:   Dressing Type Foam - Lift dressing to assess site every shift 09/02/22 0900    DVT prophylaxis:enoxaparin (LOVENOX) injection 30 mg Start: 08/29/22 2200 SCDs Start: 08/29/22 2010     Code Status: DNR  Family Communication: Called and discussed with son on phone on 4/8  Patient status: Inpatient  Patient is from : Home  Anticipated discharge to: SNF  Estimated DC date:tomorrow. need to wait for improvement of diarrhea before discharge   Consultants: None  Procedures: None  Antimicrobials:  Anti-infectives (From admission, onward)    Start     Dose/Rate Route Frequency Ordered Stop   08/31/22 1000  vancomycin (VANCOCIN) capsule 125 mg        125 mg Oral 4 times daily 08/31/22 0041 09/10/22 0959   08/31/22 0830  vancomycin (VANCOCIN) 50 mg/mL oral solution SOLN 125 mg  Status:  Discontinued        125 mg Oral Every 6 hours 08/31/22 0744 08/31/22 0744       Subjective: Patient seen and examined at bedside today.  Hemodynamically stable.  She just spilled her breakfast on her chest.  She says her stool is looking little firmer than yesterday.  Denies abdominal pain.  Objective: Vitals:   09/01/22 0605 09/01/22 1455 09/01/22 2017 09/02/22 0606  BP: 130/86 114/69 117/71 111/68  Pulse: 68 (!) 58 61 61  Resp: 14 17 16 18   Temp: 98.4 F (36.9 C) 97.7 F (36.5 C) 97.8 F (36.6 C) 98.8 F (37.1 C)  TempSrc:  Oral Oral   SpO2: 100% 100% 100% 100%  Weight: 66.2 kg   63.5 kg  Height:        Intake/Output Summary (Last 24 hours) at 09/02/2022 1126 Last data filed at 09/02/2022 0900 Gross per 24 hour  Intake 1060 ml  Output 1100 ml  Net -40 ml   Filed Weights   08/31/22 0517 09/01/22 0605 09/02/22 0606   Weight: 61.2 kg 66.2 kg 63.5 kg    Examination:   General exam: Overall comfortable, not in distress,weak,deconditioned HEENT: PERRL Respiratory system:  no wheezes or crackles  Cardiovascular system: S1 & S2 heard, RRR.  Gastrointestinal system: Abdomen is nondistended, soft and nontender. Central nervous system: Alert and oriented Extremities: No edema, no clubbing ,no cyanosis Skin: Sacral pressure ulcer,scattered bruises ZO:[id number]  Data Reviewed: I have personally reviewed following labs and imaging studies  CBC: Recent Labs  Lab 08/29/22 1548 08/30/22 0402 09/02/22 0453  WBC 10.8* 8.5 6.9  NEUTROABS 8.9*  --   --   HGB 11.6* 9.5* 9.1*  HCT 36.1 30.5* 29.8*  MCV 98.9 102.0* 102.1*  PLT 345 281 247   Basic Metabolic Panel: Recent Labs  Lab 08/29/22 1548 08/30/22 0402 08/31/22 0757 09/01/22 0352 09/01/22 1529 09/02/22 0453  NA 135 139 136 136  --  134*  K 4.5 4.0 3.5 2.8* 4.1 3.4*  CL 109 114* 113* 114*  --  113*  CO2 18* 18* 18* 19*  --  18*  GLUCOSE 86 69* 84 80  --  73  BUN 35* 36* 29* 32*  --  32*  CREATININE 1.60* 1.50* 1.35* 1.32*  --  1.30*  CALCIUM 8.8* 8.5* 8.0* 8.0*  --  7.9*  MG  --  1.7  --   --   --   --   PHOS  --  3.9  --   --   --   --      Recent Results (from the past 240 hour(s))  C Difficile Quick Screen w PCR reflex     Status: Abnormal   Collection Time: 08/30/22 11:00 AM   Specimen: Stool  Result Value Ref Range Status   C Diff antigen POSITIVE (A) NEGATIVE Final   C Diff toxin NEGATIVE NEGATIVE Final   C Diff interpretation Results are indeterminate. See PCR results.  Final    Comment: Performed at [facility], [address] [place], [place] [address]  Gastrointestinal Panel by PCR , Stool     Status: None   Collection Time: 08/30/22 11:00 AM   Specimen: Stool  Result Value Ref Range Status   Campylobacter species NOT DETECTED NOT DETECTED Final   Plesimonas shigelloides NOT DETECTED NOT DETECTED Final   Salmonella  species NOT DETECTED NOT DETECTED Final   Yersinia enterocolitica NOT DETECTED NOT DETECTED Final   Vibrio species NOT DETECTED NOT DETECTED Final   Vibrio cholerae NOT DETECTED NOT DETECTED Final   Enteroaggregative E coli (EAEC) NOT DETECTED NOT DETECTED Final   Enteropathogenic E coli (EPEC) NOT DETECTED NOT DETECTED Final   Enterotoxigenic E coli (ETEC) NOT DETECTED NOT DETECTED Final   Shiga like toxin producing E coli (STEC) NOT DETECTED NOT DETECTED Final   Shigella/Enteroinvasive E coli (EIEC) NOT DETECTED NOT DETECTED Final   Cryptosporidium NOT DETECTED NOT DETECTED Final   Cyclospora cayetanensis NOT DETECTED NOT DETECTED Final   Entamoeba histolytica NOT DETECTED NOT DETECTED Final   Giardia lamblia NOT DETECTED NOT DETECTED Final   Adenovirus F40/41 NOT DETECTED NOT DETECTED Final   Astrovirus NOT DETECTED NOT DETECTED Final   Norovirus GI/GII NOT DETECTED NOT DETECTED Final   Rotavirus A NOT DETECTED NOT DETECTED Final   Sapovirus (I, II, IV, and V) NOT DETECTED NOT DETECTED Final    Comment: Performed at [facility], [address] [place], [place] [address]  C. Diff by PCR, Reflexed     Status: Abnormal   Collection Time: 08/30/22 11:00 AM  Result Value Ref Range Status   Toxigenic C. Difficile by PCR POSITIVE (A) NEGATIVE Final    Comment: Positive for toxigenic C. difficile with little to no toxin production. Only treat if clinical presentation suggests symptomatic illness. Performed at [facility], [address] [place], [place] [address]      Radiology Studies: No results found.  Scheduled Meds:  Chlorhexidine Gluconate Cloth  6 each Topical Daily   cholestyramine light  4 g Oral BID   enoxaparin (LOVENOX) injection  30 mg Subcutaneous Q24H   leptospermum manuka honey  1 Application Topical Daily   levothyroxine  75 mcg Oral Q0600   liver oil-zinc oxide   Topical TID   nutrition supplement (JUVEN)  1 packet Oral BID BM   nystatin    Topical TID   pantoprazole  40 mg Oral q morning   rOPINIRole  3 mg Oral QHS   sodium bicarbonate  650 mg Oral BID   vancomycin  125 mg Oral QID   Continuous Infusions:  sodium chloride 75 mL/hr at 08/31/22  2334     LOS: 3 days   Burnadette Pop, MD [facility] P4/12/2022, 11:26 AM

## 2022-09-02 NOTE — Progress Notes (Signed)
Patients foley catheter removed at 1405 per MD order, patient reported no complaints of discomfort after removal. Pericare given and purewick placed.

## 2022-09-02 NOTE — Progress Notes (Signed)
Bladder scanned patient after foley removed, noted 82 in bladder. Reported to on-coming nurse Paulino Rily.

## 2022-09-03 DIAGNOSIS — W19XXXA Unspecified fall, initial encounter: Secondary | ICD-10-CM | POA: Diagnosis not present

## 2022-09-03 DIAGNOSIS — Y92009 Unspecified place in unspecified non-institutional (private) residence as the place of occurrence of the external cause: Secondary | ICD-10-CM | POA: Diagnosis not present

## 2022-09-03 DIAGNOSIS — R748 Abnormal levels of other serum enzymes: Secondary | ICD-10-CM | POA: Diagnosis not present

## 2022-09-03 LAB — BASIC METABOLIC PANEL
Anion gap: 2 — ABNORMAL LOW (ref 5–15)
BUN: 33 mg/dL — ABNORMAL HIGH (ref 8–23)
CO2: 18 mmol/L — ABNORMAL LOW (ref 22–32)
Calcium: 7.7 mg/dL — ABNORMAL LOW (ref 8.9–10.3)
Chloride: 114 mmol/L — ABNORMAL HIGH (ref 98–111)
Creatinine, Ser: 1.34 mg/dL — ABNORMAL HIGH (ref 0.44–1.00)
GFR, Estimated: 41 mL/min — ABNORMAL LOW (ref >60)
Glucose, Bld: 65 mg/dL — ABNORMAL LOW (ref 70–99)
Potassium: 3.4 mmol/L — ABNORMAL LOW (ref 3.5–5.1)
Sodium: 134 mmol/L — ABNORMAL LOW (ref 135–145)

## 2022-09-03 LAB — MAGNESIUM: Magnesium: 1.4 mg/dL — ABNORMAL LOW (ref 1.7–2.4)

## 2022-09-03 MED ORDER — DEXTROSE-NACL 5-0.9 % IV SOLN: INTRAVENOUS | Status: DC

## 2022-09-03 MED ORDER — MAGNESIUM SULFATE 2 GM/50ML IV SOLN
2.0000 g | Freq: Once | INTRAVENOUS | Status: AC
  Administered 2022-09-03: 2 g via INTRAVENOUS
  Filled 2022-09-03: qty 50

## 2022-09-03 MED ORDER — ENOXAPARIN SODIUM 40 MG/0.4ML IJ SOSY
40.0000 mg | PREFILLED_SYRINGE | INTRAMUSCULAR | Status: DC
  Administered 2022-09-03: 40 mg via SUBCUTANEOUS
  Filled 2022-09-03: qty 0.4

## 2022-09-03 MED ORDER — POTASSIUM CHLORIDE 20 MEQ PO PACK
40.0000 meq | PACK | Freq: Once | ORAL | Status: AC
  Administered 2022-09-03: 40 meq via ORAL
  Filled 2022-09-03: qty 2

## 2022-09-03 NOTE — Evaluation (Signed)
Occupational Therapy Evaluation Patient Details Name: [name] MRN: [id number] DOB: [birth year] Today's Date: 09/03/2022   History of Present Illness [name] is a 77 y.o. female with medical history significant of  hypothyroidism, GERD, RLS, CKD 3B (baseline creatinine of 1.7-1.9) who presents to the emergency department after sustaining a fall at home yesterday.  Patient was discharged 2 days ago from a nursing rehab facility to home after being admitted from 2/10 to 2/14 due to acute metabolic encephalopathy, rhabdomyolysis with dehydration, hypokalemia and diarrhea.  Patient fell yesterday at night and was unable to get up from the floor, her care provider was unable to get her off the floor, so patient ended up spending the night on the floor, son was able to get off the floor today and she was brought to the ED for further evaluation and management.  She complained of low back and pelvic pain and also complained of discomfort in her legs due to history of restless leg syndrome.  Son reported that patient's bottom was swollen and red, she denies chest pain, shortness of breath, fever, chills, palpitation, nausea or vomiting.   Clinical Impression   Pt agreeable to OT evaluation. Pt is generally weak with slow labored movement. Mod A to sit in bed and mod to max to return to supine. Mod A for sit to stand with inability to take steps laterally. Pt  is limited by poor endurance and strength. Pt will benefit from continued OT in the hospital and recommended venue below to increase strength, balance, and endurance for safe ADL's.         Recommendations for follow up therapy are one component of a multi-disciplinary discharge planning process, led by the attending physician.  Recommendations may be updated based on patient status, additional functional criteria and insurance authorization.   Assistance Recommended at Discharge Intermittent Supervision/Assistance  Patient can return home with  the following A lot of help with walking and/or transfers;A lot of help with bathing/dressing/bathroom;Assistance with cooking/housework;Assist for transportation;Help with stairs or ramp for entrance    Functional Status Assessment  Patient has had a recent decline in their functional status and demonstrates the ability to make significant improvements in function in a reasonable and predictable amount of time.  Equipment Recommendations  None recommended by OT    Recommendations for Other Services       Precautions / Restrictions Precautions Precautions: Fall Restrictions Weight Bearing Restrictions: No      Mobility Bed Mobility Overal bed mobility: Needs Assistance Bed Mobility: Supine to Sit, Sit to Supine     Supine to sit: Mod assist, HOB elevated Sit to supine: Mod assist, Max assist, HOB elevated   General bed mobility comments: slow, labored, assist for BLE and to upright trunk    Transfers Overall transfer level: Needs assistance Equipment used: Rolling walker (2 wheels) Transfers: Sit to/from Stand Sit to Stand: Mod assist           General transfer comment: Sit to stand from EOB with RW. Able to slide L LE a couple inches to L side.      Balance Overall balance assessment: Needs assistance Sitting-balance support: Feet supported, No upper extremity supported Sitting balance-Leahy Scale: Good Sitting balance - Comments: seated at EOB   Standing balance support: Bilateral upper extremity supported, Reliant on assistive device for balance Standing balance-Leahy Scale: Poor Standing balance comment: poor with RW  ADL either performed or assessed with clinical judgement   ADL Overall ADL's : Needs assistance/impaired     Grooming: Minimal assistance;Moderate assistance;Sitting   Upper Body Bathing: Minimal assistance;Moderate assistance;Sitting   Lower Body Bathing: Maximal assistance;Total  assistance;Sitting/lateral leans   Upper Body Dressing : Minimal assistance;Moderate assistance;Sitting   Lower Body Dressing: Maximal assistance;Total assistance;Sitting/lateral leans   Toilet Transfer: Moderate assistance;Rolling walker (2 wheels) Toilet Transfer Details (indicate cue type and reason): Partially simulated via sit to stand from EOB. Toileting- Clothing Manipulation and Hygiene: Maximal assistance;Total assistance;Bed level               Vision Baseline Vision/History: 1 Wears glasses Ability to See in Adequate Light: 0 Adequate Patient Visual Report: No change from baseline Vision Assessment?: No apparent visual deficits                Pertinent Vitals/Pain Pain Assessment Pain Assessment: Faces Faces Pain Scale: Hurts little more Pain Location: stomach and low back Pain Descriptors / Indicators: Guarding Pain Intervention(s): Limited activity within patient's tolerance, Monitored during session, Repositioned     Hand Dominance Right   Extremity/Trunk Assessment Upper Extremity Assessment Upper Extremity Assessment: Generalized weakness (Limited B A/ROM for shoulder flexion to 50% or less.)   Lower Extremity Assessment Lower Extremity Assessment: Defer to PT evaluation   Cervical / Trunk Assessment Cervical / Trunk Assessment: Kyphotic   Communication Communication Communication: HOH   Cognition Arousal/Alertness: Awake/alert Behavior During Therapy: WFL for tasks assessed/performed Overall Cognitive Status: Within Functional Limits for tasks assessed                                                        Home Living Family/patient expects to be discharged to:: Private residence Living Arrangements: Children Available Help at Discharge: Family;Available 24 hours/day Type of Home: House Home Access: Ramped entrance     Home Layout: Two level;Able to live on main level with bedroom/bathroom Alternate Level  Stairs-Number of Steps: Patient states she does not go upstairs   Bathroom Shower/Tub: Chief Strategy Officer: Standard Bathroom Accessibility: Yes   Home Equipment: Agricultural consultant (2 wheels);Wheelchair - manual;Shower seat;BSC/3in1;Grab bars - tub/shower          Prior Functioning/Environment Prior Level of Function : Needs assist       Physical Assist : Mobility (physical);ADLs (physical) Mobility (physical): Bed mobility;Transfers;Gait;Stairs ADLs (physical): IADLs Mobility Comments: Household ambulator using RW ADLs Comments: Pt reports independence for ADL's and IADL's.        OT Problem List: Decreased strength;Decreased range of motion;Decreased activity tolerance;Impaired balance (sitting and/or standing)      OT Treatment/Interventions: Self-care/ADL training;Therapeutic exercise;DME and/or AE instruction;Therapeutic activities;Balance training;Patient/family education    OT Goals(Current goals can be found in the care plan section) Acute Rehab OT Goals Patient Stated Goal: Improve function. OT Goal Formulation: With patient Time For Goal Achievement: 09/17/22 Potential to Achieve Goals: Good  OT Frequency: Min 2X/week                                   End of Session Equipment Utilized During Treatment: Rolling walker (2 wheels);Gait belt  Activity Tolerance: Patient tolerated treatment well Patient left: in bed;with call bell/phone within reach  OT Visit Diagnosis:  Unsteadiness on feet (R26.81);Other abnormalities of gait and mobility (R26.89);Muscle weakness (generalized) (M62.81);History of falling (Z91.81)                Time: [id number] OT Time Calculation (min): 19 min Charges:  OT General Charges $OT Visit: 1 Visit OT Evaluation $OT Eval Low Complexity: 1 Low  Digby Groeneveld OT, MOT  Danie ChandlerSamuel  Kirsten Mckone 09/03/2022, 10:08 AM

## 2022-09-03 NOTE — Plan of Care (Signed)
  Problem: Acute Rehab OT Goals (only OT should resolve) Goal: Pt. Will Perform Grooming Flowsheets (Taken 09/03/2022 1010) Pt Will Perform Grooming:  with modified independence  sitting Goal: Pt. Will Perform Upper Body Bathing Flowsheets (Taken 09/03/2022 1010) Pt Will Perform Upper Body Bathing:  with modified independence  sitting Goal: Pt. Will Perform Upper Body Dressing Flowsheets (Taken 09/03/2022 1010) Pt Will Perform Upper Body Dressing:  with modified independence  sitting Goal: Pt. Will Perform Lower Body Dressing Flowsheets (Taken 09/03/2022 1010) Pt Will Perform Lower Body Dressing:  with mod assist  sitting/lateral leans  with adaptive equipment Goal: Pt. Will Transfer To Toilet Flowsheets (Taken 09/03/2022 1010) Pt Will Transfer to Toilet:  with min guard assist  stand pivot transfer Goal: Pt. Will Perform Toileting-Clothing Manipulation Flowsheets (Taken 09/03/2022 1010) Pt Will Perform Toileting - Clothing Manipulation and hygiene:  with min guard assist  sitting/lateral leans Goal: Pt/Caregiver Will Perform Home Exercise Program Flowsheets (Taken 09/03/2022 1010) Pt/caregiver will Perform Home Exercise Program:  Increased ROM  Increased strength  Both right and left upper extremity  Independently  [place] Sylvan OT, MOT

## 2022-09-03 NOTE — Progress Notes (Signed)
PROGRESS NOTE  Cindy BeetsWinona Saunders  ZOX:[id number] DOB: 08/12/1945 DOA: 08/29/2022 PCP: Cindy PieriniMartin, Mary-Margaret, FNP   Brief Narrative:  Patient is a 77 year old female with history of hypothyroidism, GERD, restless leg syndrome, CKD stage IIIb with baseline creatinine of 1.7-1.9 who presents to the emergency department for the evaluation of fall at home.  She was just discharged from skilled nursing facility to home. She was admitted from 2/10 to 2/14 for acute metabolic encephalopathy, rhabdomyolysis, dehydration, hypokalemia and diarrhea.  Patient fell and was unable to get up from the floor. Lives with daughter. She also complained of low back pain, pelvic pain .  On presentation, she was hemodynamically stable.  Lab work showed WBC count of 10.8, creatinine 1.6, CK of 408.  CT lumbar spine without contrast, CT head without contrast, chest x-ray, pelvic x-ray did not show any acute findings.  Lumbar spine x-ray showed new mild compression deformity of L1 vertebral body. C diff came out to be positive, started on oral vancomycin.  PT/OT recommending SNF on discharge.  Diarrhea now slowing down.  Has hypomagnesemia,hypokalemia, hypoglycemia today.plan for discharge tomorrow to SNF if further improvement   Assessment & Plan:  Principal Problem:   Fall at home, initial encounter Active Problems:   Mixed hyperlipidemia   Acquired hypothyroidism   Dehydration   Low back pain   Failure to thrive in adult   Physical deconditioning   Elevated CK   Decubitus ulcer   GERD (gastroesophageal reflux disease)   Restless leg syndrome   Frequent falls  Fall: Fell at home.  Recently discharged from skilled nursing facility.  Chronic ambulatory dysfunction.  PT/OT evaluation done,recommended SNF  C diff diarrhea: Reported to have  loose bowel movements in the ED.  Rectal tube placed.  C. difficile came out to be positive.  Started on oral vancomycin.  Her abdomen is benign: soft and nondistended.  Denies any  abdomen pain.  Can use imodium for severe diarrhea.  Diarrhea still ongoing but slowing down. Continue gentle IV fluids for today  Hypokalemia/hypomagnesemia: Being monitored and supplemented  Elevated CK: Likely from rhabdomyolysis from fall.  On iv fluid  Sacral decubitus ulcer: Wound care consulted.Doesnot appear to be infected  Mild compression fracture of L1 vertebral body: Lumbar x-ray showed compression fracture but CT lumbar spine did not show any fracture .no back pain  Non-anion gap metabolic acidosis: CO2 is 18.  Likely associated with diarrhea.  Started on sodium bicarb tablets  Hypothyroidism: Continue Synthyroid  GERD: Continue Protonix  RLS: Continue Requip  Moderate protein calorie moderation: Albumin of 2.6.  Nutritionist consulted and following  CKD stage IIIb: Baseline creatinine ranges from 1.7-1.9.  Currently kidney function at baseline    Nutrition Problem: Moderate Malnutrition Etiology: altered GI function (chronic diarrhea) Pressure Injury 01/10/22 Coccyx Medial;Mid Unstageable - Full thickness tissue loss in which the base of the injury is covered by slough (yellow, tan, gray, green or brown) and/or eschar (tan, brown or black) in the wound bed. dark brown, black in color (Active)  01/10/22 1200  Location: Coccyx  Location Orientation: Medial;Mid  Staging: Unstageable - Full thickness tissue loss in which the base of the injury is covered by slough (yellow, tan, gray, green or brown) and/or eschar (tan, brown or black) in the wound bed.  Wound Description (Comments): dark brown, black in color  Present on Admission: Yes  Dressing Type Foam - Lift dressing to assess site every shift 09/02/22 2000     Pressure Injury 08/30/22 Heel Left Deep  Tissue Pressure Injury - Purple or maroon localized area of discolored intact skin or blood-filled blister due to damage of underlying soft tissue from pressure and/or shear. (Active)  08/30/22 1441  Location: Heel   Location Orientation: Left  Staging: Deep Tissue Pressure Injury - Purple or maroon localized area of discolored intact skin or blood-filled blister due to damage of underlying soft tissue from pressure and/or shear.  Wound Description (Comments):   Present on Admission:   Dressing Type Foam - Lift dressing to assess site every shift 09/02/22 2000    DVT prophylaxis:enoxaparin (LOVENOX) injection 40 mg Start: 09/03/22 2200 SCDs Start: 08/29/22 2010     Code Status: DNR  Family Communication: Called and discussed with son on phone on 4/8  Patient status: Inpatient  Patient is from : Home  Anticipated discharge to: SNF  Estimated DC date:tomorrow.  Awaiting improvement in the electrolytes and blood sugar  Consultants: None  Procedures: None  Antimicrobials:  Anti-infectives (From admission, onward)    Start     Dose/Rate Route Frequency Ordered Stop   08/31/22 1000  vancomycin (VANCOCIN) capsule 125 mg        125 mg Oral 4 times daily 08/31/22 0041 09/10/22 0959   08/31/22 0830  vancomycin (VANCOCIN) 50 mg/mL oral solution SOLN 125 mg  Status:  Discontinued        125 mg Oral Every 6 hours 08/31/22 0744 08/31/22 0744       Subjective: Patient seen and examined at bedside today.  Appears overall comfortable.  Denies abdomen pain, nausea or vomiting.  Diarrhea happened last night not this morning.  [birth year]  Objective: Vitals:   09/02/22 0606 09/02/22 1421 09/02/22 2059 09/03/22 0511  BP: 111/68 116/60 109/62 (!) 108/59  Pulse: 61 62 (!) 57 64  Resp: 18  18 16   Temp: 98.8 F (37.1 C) 98.5 F (36.9 C) 97.9 F (36.6 C) 99.1 F (37.3 C)  TempSrc:  Oral Oral Oral  SpO2: 100% 100% 99% 98%  Weight: 63.5 kg   67.6 kg  Height:        Intake/Output Summary (Last 24 hours) at 09/03/2022 1130 Last data filed at 09/03/2022 0900 Gross per 24 hour  Intake 4390.46 ml  Output 400 ml  Net 3990.46 ml   Filed Weights   09/01/22 0605 09/02/22 0606 09/03/22 0511   Weight: 66.2 kg 63.5 kg 67.6 kg    Examination:  General exam: Overall comfortable, not in distress, very deconditioned, chronically looking HEENT: PERRL Respiratory system:  no wheezes or crackles  Cardiovascular system: S1 & S2 heard, RRR.  Gastrointestinal system: Abdomen is nondistended, soft and nontender. Central nervous system: Alert and oriented Extremities: No edema, no clubbing ,no cyanosis Skin: Sacral pressure ulcer, scattered bruises  Data Reviewed: I have personally reviewed following labs and imaging studies  CBC: Recent Labs  Lab 08/29/22 1548 08/30/22 0402 09/02/22 0453  WBC 10.8* 8.5 6.9  NEUTROABS 8.9*  --   --   HGB 11.6* 9.5* 9.1*  HCT 36.1 30.5* 29.8*  MCV 98.9 102.0* 102.1*  PLT 345 281 247   Basic Metabolic Panel: Recent Labs  Lab 08/30/22 0402 08/31/22 0757 09/01/22 0352 09/01/22 1529 09/02/22 0453 09/03/22 0403  NA 139 136 136  --  134* 134*  K 4.0 3.5 2.8* 4.1 3.4* 3.4*  CL 114* 113* 114*  --  113* 114*  CO2 18* 18* 19*  --  18* 18*  GLUCOSE 69* 84 80  --  73 65*  BUN 36* 29* 32*  --  32* 33*  CREATININE 1.50* 1.35* 1.32*  --  1.30* 1.34*  CALCIUM 8.5* 8.0* 8.0*  --  7.9* 7.7*  MG 1.7  --   --   --   --  1.4*  PHOS 3.9  --   --   --   --   --      Recent Results (from the past 240 hour(s))  C Difficile Quick Screen w PCR reflex     Status: Abnormal   Collection Time: 08/30/22 11:00 AM   Specimen: Stool  Result Value Ref Range Status   C Diff antigen POSITIVE (A) NEGATIVE Final   C Diff toxin NEGATIVE NEGATIVE Final   C Diff interpretation Results are indeterminate. See PCR results.  Final    Comment: Performed at [facility], [address] [place], [place] [address]  Gastrointestinal Panel by PCR , Stool     Status: None   Collection Time: 08/30/22 11:00 AM   Specimen: Stool  Result Value Ref Range Status   Campylobacter species NOT DETECTED NOT DETECTED Final   Plesimonas shigelloides NOT DETECTED NOT DETECTED Final    Salmonella species NOT DETECTED NOT DETECTED Final   Yersinia enterocolitica NOT DETECTED NOT DETECTED Final   Vibrio species NOT DETECTED NOT DETECTED Final   Vibrio cholerae NOT DETECTED NOT DETECTED Final   Enteroaggregative E coli (EAEC) NOT DETECTED NOT DETECTED Final   Enteropathogenic E coli (EPEC) NOT DETECTED NOT DETECTED Final   Enterotoxigenic E coli (ETEC) NOT DETECTED NOT DETECTED Final   Shiga like toxin producing E coli (STEC) NOT DETECTED NOT DETECTED Final   Shigella/Enteroinvasive E coli (EIEC) NOT DETECTED NOT DETECTED Final   Cryptosporidium NOT DETECTED NOT DETECTED Final   Cyclospora cayetanensis NOT DETECTED NOT DETECTED Final   Entamoeba histolytica NOT DETECTED NOT DETECTED Final   Giardia lamblia NOT DETECTED NOT DETECTED Final   Adenovirus F40/41 NOT DETECTED NOT DETECTED Final   Astrovirus NOT DETECTED NOT DETECTED Final   Norovirus GI/GII NOT DETECTED NOT DETECTED Final   Rotavirus A NOT DETECTED NOT DETECTED Final   Sapovirus (I, II, IV, and V) NOT DETECTED NOT DETECTED Final    Comment: Performed at [facility], [address] [place], [place] [address]  C. Diff by PCR, Reflexed     Status: Abnormal   Collection Time: 08/30/22 11:00 AM  Result Value Ref Range Status   Toxigenic C. Difficile by PCR POSITIVE (A) NEGATIVE Final    Comment: Positive for toxigenic C. difficile with little to no toxin production. Only treat if clinical presentation suggests symptomatic illness. Performed at [facility], [address] [place], [place] [address]      Radiology Studies: No results found.  Scheduled Meds:  Chlorhexidine Gluconate Cloth  6 each Topical Daily   cholestyramine light  4 g Oral BID   enoxaparin (LOVENOX) injection  40 mg Subcutaneous Q24H   leptospermum manuka honey  1 Application Topical Daily   levothyroxine  75 mcg Oral Q0600   liver oil-zinc oxide   Topical TID   nutrition supplement (JUVEN)  1 packet Oral BID BM    nystatin   Topical TID   pantoprazole  40 mg Oral q morning   rOPINIRole  3 mg Oral QHS   sodium bicarbonate  650 mg Oral TID   vancomycin  125 mg Oral QID   Continuous Infusions:  dextrose 5 % and 0.9% NaCl  LOS: 4 days   Burnadette Pop, MD [facility] P4/01/2023, 11:30 AM

## 2022-09-03 NOTE — Consult Note (Signed)
   [facility] CM Inpatient Consult   09/03/2022  [name] 1945/08/23 [id number]     Location: [facility] RN Hospital Liaison Screened remotely [facility]).   [facility] Customer service manager [facility]) Accountable Care Organization [ACO] Patient: Insurance [facility] ACO)    Primary Care Provider:  [name], FNP [facility]   Patient screened for readmission hospitalization with noted high risk score for unplanned readmission risk with 2 IP in 6 months. [facility]/Population Health RN liaison will assess for potential [facility]) Care Management service needs for post hospital transition for care coordination. Pt discharging to a facility that is not followed by [facility] PAC-RN.                                  Plan. Pt discharging to SNF that is not followed by PAC-RN.   [facility] Care Management/Population Health does not replace or interfere with any arrangements made by the Inpatient Transition of Care team.   For questions contact:     Elliot Cousin, RN, BSN [facility] Liaison [place]   [facility]  Population Health Office Hours MTWF 7:30 am to 6 pm [phone] mobile [phone] [Office toll free line][facility] Office Hours are M-F 8:30 - 5 pm 24 hour nurse advise line [phone] Conceirge  [email or web address]

## 2022-09-03 NOTE — Care Management Important Message (Signed)
Important Message  Patient Details  Name: [name] MRN: [id number] Date of Birth: May 27, 1946   Medicare Important Message Given:  Yes (spoke with daughter in law [place], no additional copy needed)     Corey Harold 09/03/2022, 10:59 AM

## 2022-09-03 NOTE — Plan of Care (Signed)
Patient AOX4, VSS throughout shift. All meds given on time as ordered. Pt denied pain and SOB. Diminished lungs, IS encouraged. Pt had multiple Bms o/n. Pt voided in bed, bladder scan 27ml.  Full linen changed. Fluids on-going. POC maintained, will continue to monitor.   Problem: Education: Goal: Knowledge of General Education information will improve Description: Including pain rating scale, medication(s)/side effects and non-pharmacologic comfort measures Outcome: Progressing   Problem: Health Behavior/Discharge Planning: Goal: Ability to manage health-related needs will improve Outcome: Progressing   Problem: Clinical Measurements: Goal: Ability to maintain clinical measurements within normal limits will improve Outcome: Progressing Goal: Will remain free from infection Outcome: Progressing Goal: Diagnostic test results will improve Outcome: Progressing Goal: Respiratory complications will improve Outcome: Progressing Goal: Cardiovascular complication will be avoided Outcome: Progressing   Problem: Activity: Goal: Risk for activity intolerance will decrease Outcome: Progressing   Problem: Nutrition: Goal: Adequate nutrition will be maintained Outcome: Progressing   Problem: Coping: Goal: Level of anxiety will decrease Outcome: Progressing   Problem: Elimination: Goal: Will not experience complications related to bowel motility Outcome: Progressing Goal: Will not experience complications related to urinary retention Outcome: Progressing   Problem: Pain Managment: Goal: General experience of comfort will improve Outcome: Progressing   Problem: Safety: Goal: Ability to remain free from injury will improve Outcome: Progressing   Problem: Skin Integrity: Goal: Risk for impaired skin integrity will decrease Outcome: Progressing

## 2022-09-04 DIAGNOSIS — Y92009 Unspecified place in unspecified non-institutional (private) residence as the place of occurrence of the external cause: Secondary | ICD-10-CM | POA: Diagnosis not present

## 2022-09-04 DIAGNOSIS — L89622 Pressure ulcer of left heel, stage 2: Secondary | ICD-10-CM | POA: Diagnosis not present

## 2022-09-04 DIAGNOSIS — G2581 Restless legs syndrome: Secondary | ICD-10-CM | POA: Diagnosis not present

## 2022-09-04 DIAGNOSIS — F32A Depression, unspecified: Secondary | ICD-10-CM | POA: Diagnosis not present

## 2022-09-04 DIAGNOSIS — R404 Transient alteration of awareness: Secondary | ICD-10-CM | POA: Diagnosis not present

## 2022-09-04 DIAGNOSIS — M25551 Pain in right hip: Secondary | ICD-10-CM | POA: Diagnosis not present

## 2022-09-04 DIAGNOSIS — I1310 Hypertensive heart and chronic kidney disease without heart failure, with stage 1 through stage 4 chronic kidney disease, or unspecified chronic kidney disease: Secondary | ICD-10-CM | POA: Diagnosis not present

## 2022-09-04 DIAGNOSIS — I890 Lymphedema, not elsewhere classified: Secondary | ICD-10-CM | POA: Diagnosis not present

## 2022-09-04 DIAGNOSIS — E785 Hyperlipidemia, unspecified: Secondary | ICD-10-CM | POA: Diagnosis present

## 2022-09-04 DIAGNOSIS — R197 Diarrhea, unspecified: Secondary | ICD-10-CM | POA: Diagnosis not present

## 2022-09-04 DIAGNOSIS — R627 Adult failure to thrive: Secondary | ICD-10-CM | POA: Diagnosis not present

## 2022-09-04 DIAGNOSIS — L89324 Pressure ulcer of left buttock, stage 4: Secondary | ICD-10-CM | POA: Diagnosis present

## 2022-09-04 DIAGNOSIS — W19XXXD Unspecified fall, subsequent encounter: Secondary | ICD-10-CM | POA: Diagnosis not present

## 2022-09-04 DIAGNOSIS — K219 Gastro-esophageal reflux disease without esophagitis: Secondary | ICD-10-CM | POA: Diagnosis not present

## 2022-09-04 DIAGNOSIS — R4182 Altered mental status, unspecified: Secondary | ICD-10-CM | POA: Diagnosis not present

## 2022-09-04 DIAGNOSIS — I10 Essential (primary) hypertension: Secondary | ICD-10-CM | POA: Diagnosis not present

## 2022-09-04 DIAGNOSIS — Z7189 Other specified counseling: Secondary | ICD-10-CM | POA: Diagnosis not present

## 2022-09-04 DIAGNOSIS — F411 Generalized anxiety disorder: Secondary | ICD-10-CM | POA: Diagnosis not present

## 2022-09-04 DIAGNOSIS — L089 Local infection of the skin and subcutaneous tissue, unspecified: Secondary | ICD-10-CM | POA: Diagnosis not present

## 2022-09-04 DIAGNOSIS — R410 Disorientation, unspecified: Secondary | ICD-10-CM | POA: Diagnosis not present

## 2022-09-04 DIAGNOSIS — T796XXD Traumatic ischemia of muscle, subsequent encounter: Secondary | ICD-10-CM | POA: Diagnosis not present

## 2022-09-04 DIAGNOSIS — S31000D Unspecified open wound of lower back and pelvis without penetration into retroperitoneum, subsequent encounter: Secondary | ICD-10-CM | POA: Diagnosis not present

## 2022-09-04 DIAGNOSIS — Z90710 Acquired absence of both cervix and uterus: Secondary | ICD-10-CM | POA: Diagnosis not present

## 2022-09-04 DIAGNOSIS — D631 Anemia in chronic kidney disease: Secondary | ICD-10-CM | POA: Diagnosis present

## 2022-09-04 DIAGNOSIS — E161 Other hypoglycemia: Secondary | ICD-10-CM | POA: Diagnosis not present

## 2022-09-04 DIAGNOSIS — E860 Dehydration: Secondary | ICD-10-CM | POA: Diagnosis present

## 2022-09-04 DIAGNOSIS — W19XXXA Unspecified fall, initial encounter: Secondary | ICD-10-CM | POA: Diagnosis not present

## 2022-09-04 DIAGNOSIS — L8995 Pressure ulcer of unspecified site, unstageable: Secondary | ICD-10-CM | POA: Diagnosis not present

## 2022-09-04 DIAGNOSIS — R001 Bradycardia, unspecified: Secondary | ICD-10-CM | POA: Diagnosis not present

## 2022-09-04 DIAGNOSIS — Z515 Encounter for palliative care: Secondary | ICD-10-CM | POA: Diagnosis not present

## 2022-09-04 DIAGNOSIS — Z79890 Hormone replacement therapy (postmenopausal): Secondary | ICD-10-CM | POA: Diagnosis not present

## 2022-09-04 DIAGNOSIS — Z9181 History of falling: Secondary | ICD-10-CM | POA: Diagnosis not present

## 2022-09-04 DIAGNOSIS — I129 Hypertensive chronic kidney disease with stage 1 through stage 4 chronic kidney disease, or unspecified chronic kidney disease: Secondary | ICD-10-CM | POA: Diagnosis present

## 2022-09-04 DIAGNOSIS — R748 Abnormal levels of other serum enzymes: Secondary | ICD-10-CM | POA: Diagnosis not present

## 2022-09-04 DIAGNOSIS — R531 Weakness: Secondary | ICD-10-CM | POA: Diagnosis not present

## 2022-09-04 DIAGNOSIS — M25511 Pain in right shoulder: Secondary | ICD-10-CM | POA: Diagnosis not present

## 2022-09-04 DIAGNOSIS — J4489 Other specified chronic obstructive pulmonary disease: Secondary | ICD-10-CM | POA: Diagnosis present

## 2022-09-04 DIAGNOSIS — D649 Anemia, unspecified: Secondary | ICD-10-CM | POA: Diagnosis not present

## 2022-09-04 DIAGNOSIS — S32010D Wedge compression fracture of first lumbar vertebra, subsequent encounter for fracture with routine healing: Secondary | ICD-10-CM | POA: Diagnosis not present

## 2022-09-04 DIAGNOSIS — A0472 Enterocolitis due to Clostridium difficile, not specified as recurrent: Secondary | ICD-10-CM | POA: Diagnosis not present

## 2022-09-04 DIAGNOSIS — S0990XA Unspecified injury of head, initial encounter: Secondary | ICD-10-CM | POA: Diagnosis not present

## 2022-09-04 DIAGNOSIS — E559 Vitamin D deficiency, unspecified: Secondary | ICD-10-CM | POA: Diagnosis not present

## 2022-09-04 DIAGNOSIS — N390 Urinary tract infection, site not specified: Secondary | ICD-10-CM | POA: Diagnosis not present

## 2022-09-04 DIAGNOSIS — E876 Hypokalemia: Secondary | ICD-10-CM | POA: Diagnosis not present

## 2022-09-04 DIAGNOSIS — F419 Anxiety disorder, unspecified: Secondary | ICD-10-CM | POA: Diagnosis present

## 2022-09-04 DIAGNOSIS — S161XXA Strain of muscle, fascia and tendon at neck level, initial encounter: Secondary | ICD-10-CM | POA: Diagnosis not present

## 2022-09-04 DIAGNOSIS — E11649 Type 2 diabetes mellitus with hypoglycemia without coma: Secondary | ICD-10-CM | POA: Diagnosis not present

## 2022-09-04 DIAGNOSIS — R5381 Other malaise: Secondary | ICD-10-CM | POA: Diagnosis not present

## 2022-09-04 DIAGNOSIS — I4891 Unspecified atrial fibrillation: Secondary | ICD-10-CM | POA: Diagnosis present

## 2022-09-04 DIAGNOSIS — E8809 Other disorders of plasma-protein metabolism, not elsewhere classified: Secondary | ICD-10-CM | POA: Diagnosis not present

## 2022-09-04 DIAGNOSIS — F0390 Unspecified dementia without behavioral disturbance: Secondary | ICD-10-CM | POA: Diagnosis present

## 2022-09-04 DIAGNOSIS — E871 Hypo-osmolality and hyponatremia: Secondary | ICD-10-CM | POA: Diagnosis present

## 2022-09-04 DIAGNOSIS — E782 Mixed hyperlipidemia: Secondary | ICD-10-CM | POA: Diagnosis not present

## 2022-09-04 DIAGNOSIS — Z66 Do not resuscitate: Secondary | ICD-10-CM | POA: Diagnosis present

## 2022-09-04 DIAGNOSIS — M810 Age-related osteoporosis without current pathological fracture: Secondary | ICD-10-CM | POA: Diagnosis present

## 2022-09-04 DIAGNOSIS — N1832 Chronic kidney disease, stage 3b: Secondary | ICD-10-CM | POA: Diagnosis not present

## 2022-09-04 DIAGNOSIS — Z79899 Other long term (current) drug therapy: Secondary | ICD-10-CM | POA: Diagnosis not present

## 2022-09-04 DIAGNOSIS — E162 Hypoglycemia, unspecified: Secondary | ICD-10-CM | POA: Diagnosis not present

## 2022-09-04 DIAGNOSIS — Z7401 Bed confinement status: Secondary | ICD-10-CM | POA: Diagnosis not present

## 2022-09-04 DIAGNOSIS — L89150 Pressure ulcer of sacral region, unstageable: Secondary | ICD-10-CM | POA: Diagnosis not present

## 2022-09-04 DIAGNOSIS — E440 Moderate protein-calorie malnutrition: Secondary | ICD-10-CM | POA: Diagnosis not present

## 2022-09-04 DIAGNOSIS — R296 Repeated falls: Secondary | ICD-10-CM | POA: Diagnosis present

## 2022-09-04 DIAGNOSIS — E039 Hypothyroidism, unspecified: Secondary | ICD-10-CM | POA: Diagnosis not present

## 2022-09-04 DIAGNOSIS — Z91040 Latex allergy status: Secondary | ICD-10-CM | POA: Diagnosis not present

## 2022-09-04 DIAGNOSIS — G9341 Metabolic encephalopathy: Secondary | ICD-10-CM | POA: Diagnosis present

## 2022-09-04 DIAGNOSIS — R52 Pain, unspecified: Secondary | ICD-10-CM | POA: Diagnosis not present

## 2022-09-04 LAB — GLUCOSE, CAPILLARY
Glucose-Capillary: 86 mg/dL (ref 70–99)
Glucose-Capillary: 97 mg/dL (ref 70–99)

## 2022-09-04 LAB — BASIC METABOLIC PANEL
Anion gap: 3 — ABNORMAL LOW (ref 5–15)
BUN: 34 mg/dL — ABNORMAL HIGH (ref 8–23)
CO2: 19 mmol/L — ABNORMAL LOW (ref 22–32)
Calcium: 7.6 mg/dL — ABNORMAL LOW (ref 8.9–10.3)
Chloride: 113 mmol/L — ABNORMAL HIGH (ref 98–111)
Creatinine, Ser: 1.32 mg/dL — ABNORMAL HIGH (ref 0.44–1.00)
GFR, Estimated: 42 mL/min — ABNORMAL LOW (ref >60)
Glucose, Bld: 82 mg/dL (ref 70–99)
Potassium: 3.2 mmol/L — ABNORMAL LOW (ref 3.5–5.1)
Sodium: 135 mmol/L (ref 135–145)

## 2022-09-04 LAB — MAGNESIUM: Magnesium: 1.7 mg/dL (ref 1.7–2.4)

## 2022-09-04 MED ORDER — POTASSIUM CHLORIDE ER 10 MEQ PO CPCR: 10.0000 meq | ORAL_CAPSULE | Freq: Two times a day (BID) | ORAL | Status: DC

## 2022-09-04 MED ORDER — VANCOMYCIN HCL 125 MG PO CAPS: 125.0000 mg | ORAL_CAPSULE | Freq: Four times a day (QID) | ORAL | 0 refills | Status: AC

## 2022-09-04 MED ORDER — LOPERAMIDE HCL 2 MG PO CAPS: 2.0000 mg | ORAL_CAPSULE | Freq: Four times a day (QID) | ORAL | 0 refills | Status: DC | PRN

## 2022-09-04 MED ORDER — POTASSIUM CHLORIDE 20 MEQ PO PACK
40.0000 meq | PACK | ORAL | Status: AC
  Administered 2022-09-04: 40 meq via ORAL
  Filled 2022-09-04: qty 2

## 2022-09-04 MED ORDER — SODIUM BICARBONATE 650 MG PO TABS: 650.0000 mg | ORAL_TABLET | Freq: Three times a day (TID) | ORAL | 0 refills | Status: DC

## 2022-09-04 MED ORDER — MAGNESIUM OXIDE 400 MG PO CAPS: 400.0000 mg | ORAL_CAPSULE | Freq: Every day | ORAL | 0 refills | Status: DC

## 2022-09-04 MED ORDER — SACCHAROMYCES BOULARDII 250 MG PO CAPS: 250.0000 mg | ORAL_CAPSULE | Freq: Two times a day (BID) | ORAL | 0 refills | Status: AC

## 2022-09-04 MED ORDER — NYSTATIN 100000 UNIT/GM EX POWD: Freq: Three times a day (TID) | CUTANEOUS | 0 refills | Status: DC

## 2022-09-04 MED ORDER — MEDIHONEY WOUND/BURN DRESSING EX PSTE: 1.0000 | PASTE | Freq: Every day | CUTANEOUS | Status: DC

## 2022-09-04 MED ORDER — POTASSIUM CHLORIDE 20 MEQ PO PACK: 40.0000 meq | PACK | Freq: Every day | ORAL | 0 refills | Status: DC

## 2022-09-04 MED ORDER — DICYCLOMINE HCL 10 MG PO CAPS: 10.0000 mg | ORAL_CAPSULE | Freq: Three times a day (TID) | ORAL | 1 refills | Status: DC | PRN

## 2022-09-04 NOTE — Progress Notes (Signed)
Physical Therapy Treatment Patient Details Name: [name] MRN: [id number] DOB: April 24, 1946 Today's Date: 09/04/2022   History of Present Illness [name] is a 77 y.o. female with medical history significant of  hypothyroidism, GERD, RLS, CKD 3B (baseline creatinine of 1.7-1.9) who presents to the emergency department after sustaining a fall at home yesterday.  Patient was discharged 2 days ago from a nursing rehab facility to home after being admitted from 2/10 to 2/14 due to acute metabolic encephalopathy, rhabdomyolysis with dehydration, hypokalemia and diarrhea.  Patient fell yesterday at night and was unable to get up from the floor, her care provider was unable to get her off the floor, so patient ended up spending the night on the floor, son was able to get off the floor today and she was brought to the ED for further evaluation and management.  She complained of low back and pelvic pain and also complained of discomfort in her legs due to history of restless leg syndrome.  Son reported that patient's bottom was swollen and red, she denies chest pain, shortness of breath, fever, chills, palpitation, nausea or vomiting.    PT Comments    Patient agreeable for therapy.  Patient demonstrates slow labored movement for sitting up at bedside with c/o pain in left hip, after seated demonstrates fair/good return for completing BLE ROM/strengthening exercises with verbal cueing and demonstration mostly due to [facility]. Patient very unsteady on feet and had difficulty locking knees due to weakness and limited to a few side steps before having to sit due to fall risk.  Patient tolerated sitting up in chair after therapy - nursing staff notified.  Patient will benefit from continued skilled physical therapy in hospital and recommended venue below to increase strength, balance, endurance for safe ADLs and gait.       Recommendations for follow up therapy are one component of a multi-disciplinary discharge  planning process, led by the attending physician.  Recommendations may be updated based on patient status, additional functional criteria and insurance authorization.  Follow Up Recommendations  Can patient physically be transported by private vehicle: No    Assistance Recommended at Discharge Set up Supervision/Assistance  Patient can return home with the following A lot of help with walking and/or transfers;A lot of help with bathing/dressing/bathroom;Assistance with cooking/housework;Help with stairs or ramp for entrance   Equipment Recommendations  None recommended by PT    Recommendations for Other Services       Precautions / Restrictions Precautions Precautions: Fall Restrictions Weight Bearing Restrictions: No     Mobility  Bed Mobility Overal bed mobility: Needs Assistance Bed Mobility: Supine to Sit     Supine to sit: Mod assist     General bed mobility comments: increased time, labored movement    Transfers Overall transfer level: Needs assistance Equipment used: Rolling walker (2 wheels) Transfers: Sit to/from Stand, Bed to chair/wheelchair/BSC Sit to Stand: Mod assist, Max assist   Step pivot transfers: Mod assist, Max assist       General transfer comment: unsteady labored movement with buckling of knees    Ambulation/Gait Ambulation/Gait assistance: Max assist Gait Distance (Feet): 3 Feet Assistive device: Rolling walker (2 wheels) Gait Pattern/deviations: Decreased step length - right, Decreased step length - left, Decreased stride length, Trunk flexed, Shuffle, Knees buckling Gait velocity: slow     General Gait Details: limited to a few slow labored unsteady shuffling side steps with buckling of knees due to weakness   Stairs  Wheelchair Mobility    Modified Rankin (Stroke Patients Only)       Balance Overall balance assessment: Needs assistance Sitting-balance support: Feet supported, No upper extremity  supported Sitting balance-Leahy Scale: Fair Sitting balance - Comments: fair/good seated at EOB   Standing balance support: Reliant on assistive device for balance, During functional activity, Bilateral upper extremity supported Standing balance-Leahy Scale: Poor Standing balance comment: poor with RW                            Cognition Arousal/Alertness: Awake/alert Behavior During Therapy: WFL for tasks assessed/performed Overall Cognitive Status: Within Functional Limits for tasks assessed                                          Exercises General Exercises - Lower Extremity Long Arc Quad: Seated, AROM, Strengthening, Both, 10 reps Hip Flexion/Marching: Seated, AROM, Strengthening, Both, 10 reps Toe Raises: Seated, AROM, Strengthening, Both, 10 reps Heel Raises: Seated, AROM, Strengthening, Both, 10 reps    General Comments        Pertinent Vitals/Pain Pain Assessment Pain Assessment: Faces Faces Pain Scale: Hurts little more Pain Location: low back and left hip Pain Descriptors / Indicators: Guarding, Grimacing, Sore Pain Intervention(s): Limited activity within patient's tolerance, Monitored during session, Repositioned    Home Living                          Prior Function            PT Goals (current goals can now be found in the care plan section) Acute Rehab PT Goals Patient Stated Goal: return home PT Goal Formulation: With patient Time For Goal Achievement: 09/13/22 Potential to Achieve Goals: Good Progress towards PT goals: Progressing toward goals    Frequency    Min 3X/week      PT Plan Current plan remains appropriate    Co-evaluation              AM-PAC PT "6 Clicks" Mobility   Outcome Measure  Help needed turning from your back to your side while in a flat bed without using bedrails?: A Little Help needed moving from lying on your back to sitting on the side of a flat bed without using  bedrails?: A Lot Help needed moving to and from a bed to a chair (including a wheelchair)?: A Lot Help needed standing up from a chair using your arms (e.g., wheelchair or bedside chair)?: A Lot Help needed to walk in hospital room?: A Lot Help needed climbing 3-5 steps with a railing? : Total 6 Click Score: 12    End of Session   Activity Tolerance: Patient tolerated treatment well;Patient limited by fatigue Patient left: in chair;with call bell/phone within reach Nurse Communication: Mobility status PT Visit Diagnosis: Unsteadiness on feet (R26.81);Other abnormalities of gait and mobility (R26.89);Muscle weakness (generalized) (M62.81)     Time: 9432-7614 PT Time Calculation (min) (ACUTE ONLY): 30 min  Charges:  $Therapeutic Exercise: 8-22 mins $Therapeutic Activity: 8-22 mins                     12:12 PM, 09/04/22 Ocie Bob, MPT Physical Therapist with [facility] [phone] office [phone] mobile phone

## 2022-09-04 NOTE — Progress Notes (Signed)
Patient slept this shift. Wound care done this am. Patient had one bowel movement this shift. Continued to monitor.

## 2022-09-04 NOTE — TOC Transition Note (Signed)
Transition of Care [facility]) - CM/SW Discharge Note   Patient Details  Name: [name] MRN: [id number] Date of Birth: [birth year]  Transition of Care [facility]) CM/SW Contact:  Elliot Gault, LCSW Phone Number: 09/04/2022, 11:41 AM   Clinical Narrative:     Pt stable for dc to SNF today per MD. Sherron Monday with pt to review dc plan and she remains in agreement with transfer to [facility] for [facility]. Updated pt's son at pt request.  DC clinical sent electronically. RN to call report. EMS arranged.  No further TOC needs for dc.  Final next level of care: Skilled Nursing Facility Barriers to Discharge: Barriers Resolved   Patient Goals and CMS Choice CMS Medicare.gov Compare Post Acute Care list provided to:: Patient Choice offered to / list presented to : Patient  Discharge Placement                Patient chooses bed at: [facility] Patient to be transferred to facility by: EMS Name of family member notified: Shizuko Dake Patient and family notified of of transfer: 09/04/22  Discharge Plan and Services Additional resources added to the After Visit Summary for   In-house Referral: Clinical Social Work   Post Acute Care Choice: Skilled Nursing Facility                               Social Determinants of Health (SDOH) Interventions SDOH Screenings   Food Insecurity: No Food Insecurity (07/07/2022)  Housing: Low Risk  (07/07/2022)  Transportation Needs: No Transportation Needs (07/07/2022)  Utilities: Not At Risk (07/07/2022)  Alcohol Screen: Low Risk  (11/28/2021)  Depression (PHQ2-9): Low Risk  (05/06/2022)  Financial Resource Strain: Low Risk  (11/28/2021)  Physical Activity: Inactive (11/28/2021)  Social Connections: Socially Integrated (11/28/2021)  Stress: Stress Concern Present (11/28/2021)  Tobacco Use: Low Risk  (08/29/2022)     Readmission Risk Interventions    08/30/2022    3:36 PM  Readmission Risk Prevention Plan  Transportation Screening Complete  PCP or  Specialist Appt within 3-5 Days Complete  Social Work Consult for Recovery Care Planning/Counseling Complete  Palliative Care Screening Not Applicable  Medication Review [employer]) Complete

## 2022-09-04 NOTE — Discharge Summary (Signed)
Physician Discharge Summary  [name] HYQ:[id number] DOB: 09/04/1945 DOA: 08/29/2022  PCP: [name], FNP  Admit date: 08/29/2022 Discharge date: 09/04/2022  Admitted From: Home Disposition:  Home  Discharge Condition:Stable CODE STATUS: DNR Diet recommendation: Dysphagia 2  Brief/Interim Summary: Patient is a 77 year old female with history of hypothyroidism, GERD, restless leg syndrome, CKD stage IIIb with baseline creatinine of 1.7-1.9 who presents to the emergency department for the evaluation of fall at home.  She was just discharged from skilled nursing facility to home. She was admitted from 2/10 to 2/14 for acute metabolic encephalopathy, rhabdomyolysis, dehydration, hypokalemia and diarrhea.  Patient fell and was unable to get up from the floor. Lives with daughter. She also complained of low back pain, pelvic pain .  On presentation, she was hemodynamically stable.  Lab work showed WBC count of 10.8, creatinine 1.6, CK of 408.  CT lumbar spine without contrast, CT head without contrast, chest x-ray, pelvic x-ray did not show any acute findings.  Lumbar spine x-ray showed new mild compression deformity of L1 vertebral body. C diff came out to be positive, started on oral vancomycin.  PT/OT recommending SNF on discharge.  Diarrhea has slowed  down. Plan for discharge today to SNF  Following problems were addressed during the hospitalization:  Fall: Fell at home.  Recently discharged from skilled nursing facility.  Chronic ambulatory dysfunction.  PT/OT evaluation done,recommended SNF   C diff diarrhea: Reported to have  loose bowel movements in the ED.  Rectal tube placed.  C. difficile came out to be positive.  Started on oral vancomycin.  Her abdomen is benign: soft and nondistended.  Denies any abdomen pain.  Can use imodium for severe diarrhea.  Diarrhea is slowing down.  Hypokalemia/hypomagnesemia: Continue supplementation.  Check potassium and magnesium level in a  week  Elevated CK: Likely from rhabdomyolysis from fall.  Treated with iv fluid   Sacral decubitus ulcer: Wound care consulted.Doesnot appear to be infected.  Follow wound care instructions   Mild compression fracture of L1 vertebral body: Lumbar x-ray showed compression fracture but CT lumbar spine did not show any fracture .no back pain   Non-anion gap metabolic acidosis: CO2 is 19.  Likely associated with diarrhea.  Started on sodium bicarb tablets.  Check BMP in a week   Hypothyroidism: Continue Synthyroid   GERD: She was on Protonix, will discontinue because of C. difficile   RLS: Continue Requip   Moderate protein calorie moderation: Albumin of 2.6.  Nutritionist was consulted and following   CKD stage IIIb: Baseline creatinine ranges from 1.7-1.9.  Currently kidney function at baseline.  Check BMP in a week  History of hypertension: Was taking amlodipine, ARB at home.  Currently blood pressure stable without any medications.  Blood pressure medications stopped  Discharge Diagnoses:  Principal Problem:   Fall at home, initial encounter Active Problems:   Mixed hyperlipidemia   Acquired hypothyroidism   Dehydration   Low back pain   Failure to thrive in adult   Physical deconditioning   Elevated CK   Decubitus ulcer   GERD (gastroesophageal reflux disease)   Restless leg syndrome   Frequent falls    Discharge Instructions  Discharge Instructions     Diet general   Complete by: As directed    Dysphagia 2 diet   Discharge instructions   Complete by: As directed    1)Please take prescribed medications as instructed 2) Do a CBC, BMP and magnesium test in 1 week   Discharge  wound care:   Complete by: As directed    As per wound care nurse   Increase activity slowly   Complete by: As directed       Allergies as of 09/04/2022       Reactions   Celebrex [celecoxib] Swelling   Keflet [cephalexin] Swelling   Penicillins Hives, Swelling   DID THE REACTION  INVOLVE: Swelling of the face/tongue/throat, SOB, or low BP? Yes-swelling-hives Sudden or severe rash/hives, skin peeling, or the inside of the mouth or nose? Unknown Did it require medical treatment? Unknown When did it last happen?    over 10 years   If all above answers are "NO", may proceed with cephalosporin use.   Sulfa Antibiotics Swelling   Kenalog [triamcinolone Acetonide]    unknown   Latex    Lisinopril Other (See Comments)   unknown   Mobic [meloxicam]    SWELLING   Penicillin G Other (See Comments)        Medication List     STOP taking these medications    amLODipine 5 MG tablet Commonly known as: NORVASC   furosemide 20 MG tablet Commonly known as: LASIX   pantoprazole 40 MG tablet Commonly known as: PROTONIX   telmisartan 20 MG tablet Commonly known as: MICARDIS       TAKE these medications    cholestyramine 4 g packet Commonly known as: Questran Take 1 packet (4 g total) by mouth 3 (three) times daily with meals.   citalopram 40 MG tablet Commonly known as: CeleXA Take 1 tablet (40 mg total) by mouth daily.   dicyclomine 10 MG capsule Commonly known as: BENTYL Take 1 capsule (10 mg total) by mouth every 8 (eight) hours as needed for spasms. TAKE ONE CAPSULE BY MOUTH THREE TIMES DAILY BEFORE meals What changed:  how much to take how to take this when to take this reasons to take this   leptospermum manuka honey Pste paste Apply 1 Application topically daily. Start taking on: September 05, 2022   levothyroxine 75 MCG tablet Commonly known as: SYNTHROID Take 1 tablet (75 mcg total) by mouth daily.   loperamide 2 MG capsule Commonly known as: IMODIUM Take 1 capsule (2 mg total) by mouth every 6 (six) hours as needed for diarrhea or loose stools. What changed: when to take this   Magnesium Oxide 400 MG Caps Take 1 capsule (400 mg total) by mouth daily.   nystatin powder Commonly known as: MYCOSTATIN/NYSTOP Apply topically 3 (three)  times daily. On groins   potassium chloride 20 MEQ packet Commonly known as: KLOR-CON Take 40 mEq by mouth daily for 7 days. Start taking on: September 05, 2022 What changed: You were already taking a medication with the same name, and this prescription was added. Make sure you understand how and when to take each.   potassium chloride 10 MEQ CR capsule Commonly known as: MICRO-K Take 1 capsule (10 mEq total) by mouth 2 (two) times daily. Start taking on: September 12, 2022 What changed: These instructions start on September 12, 2022. If you are unsure what to do until then, ask your doctor or other care provider.   rOPINIRole 3 MG tablet Commonly known as: REQUIP Take 1 tablet (3 mg total) by mouth at bedtime.   saccharomyces boulardii 250 MG capsule Commonly known as: Saccharomycin DF Take 1 capsule (250 mg total) by mouth 2 (two) times daily for 10 days.   sodium bicarbonate 650 MG tablet Take 1 tablet (650 mg  total) by mouth 3 (three) times daily.   vancomycin 125 MG capsule Commonly known as: VANCOCIN Take 1 capsule (125 mg total) by mouth 4 (four) times daily for 6 days.               Discharge Care Instructions  (From admission, onward)           Start     Ordered   09/04/22 0000  Discharge wound care:       Comments: As per wound care nurse   09/04/22 1040            Contact information for follow-up providers     Bennie PieriniMartin, Mary-Margaret, FNP .   Specialty: Family Medicine Contact information: [address] [place] [phone]              Contact information for after-discharge care     Destination     [facility] .   Service: Skilled Nursing Contact information: [address] [place] [id number] [phone]                    Allergies  Allergen Reactions   Celebrex [Celecoxib] Swelling   Keflet [Cephalexin] Swelling   Penicillins Hives and Swelling    DID THE REACTION INVOLVE:  Swelling of the face/tongue/throat, SOB, or low BP? Yes-swelling-hives Sudden or severe rash/hives, skin peeling, or the inside of the mouth or nose? Unknown Did it require medical treatment? Unknown When did it last happen?    over 10 years   If all above answers are "NO", may proceed with cephalosporin use.    Sulfa Antibiotics Swelling   Kenalog [Triamcinolone Acetonide]     unknown   Latex    Lisinopril Other (See Comments)    unknown   Mobic [Meloxicam]     SWELLING   Penicillin G Other (See Comments)    Consultations: None   Procedures/Studies: CT Lumbar Spine Wo Contrast  Result Date: 08/29/2022 CLINICAL DATA:  Patient fell last night at home found by brother on floor today. Lower back pain. EXAM: CT LUMBAR SPINE WITHOUT CONTRAST TECHNIQUE: Multidetector CT imaging of the lumbar spine was performed without intravenous contrast administration. Multiplanar CT image reconstructions were also generated. RADIATION DOSE REDUCTION: This exam was performed according to the departmental dose-optimization program which includes automated exposure control, adjustment of the mA and/or kV according to patient size and/or use of iterative reconstruction technique. COMPARISON:  Radiographs 08/29/2022 and CT abdomen and pelvis 01/09/2022 FINDINGS: Segmentation: 5 lumbar type vertebrae. Alignment: Grade 1 anterolisthesis L4. No evidence of traumatic malalignment. Vertebrae: No acute fracture or focal pathologic process. The L1 vertebral body height loss seen on radiograph earlier today was likely artifactual due to overlapping structures as the L1 vertebral body demonstrates normal height and appears similar to 01/09/2022. Paraspinal and other soft tissues: No acute abnormality. Aortic calcification. Disc levels: Posterior fusion L4-S1 with interbody spacers at L4-L5 and L5-S1. Multilevel spondylosis, disc space height loss, and degenerative endplate changes greatest at T12-L1 where it is moderate.  Lower lumbar facet arthropathy. Streak artifact from the lumbar fusion degrades evaluation of the spinal canal and neural foramina at L4-S1. No high-grade spinal canal narrowing. IMPRESSION: No acute fracture or evidence of traumatic malalignment in the lumbar spine. Electronically Signed   By: Minerva Festeryler  Stutzman M.D.   On: 08/29/2022 18:56   CT Head Wo Contrast  Result Date: 08/29/2022 CLINICAL DATA:  Fall.  Head trauma. EXAM: CT HEAD  WITHOUT CONTRAST TECHNIQUE: Contiguous axial images were obtained from the base of the skull through the vertex without intravenous contrast. RADIATION DOSE REDUCTION: This exam was performed according to the departmental dose-optimization program which includes automated exposure control, adjustment of the mA and/or kV according to patient size and/or use of iterative reconstruction technique. COMPARISON:  Head CT 07/06/2022. FINDINGS: Brain: No acute hemorrhage. Unchanged chronic small-vessel disease. Cortical gray-white differentiation is otherwise preserved. Prominence of the ventricles and sulci within normal limits for age. No extra-axial collection. Basilar cisterns are patent. Vascular: No hyperdense vessel or unexpected calcification. Skull: No calvarial fracture or suspicious bone lesion. Skull base is unremarkable. Sinuses/Orbits: Mucous retention cysts in the left maxillary sinus. Orbits are unremarkable. Mastoids are well aerated. Other: None. IMPRESSION: No acute intracranial abnormality. Electronically Signed   By: Orvan Falconer M.D.   On: 08/29/2022 15:42   DG Thoracic Spine 2 View  Result Date: 08/29/2022 CLINICAL DATA:  Fall. EXAM: THORACIC SPINE 2 VIEWS COMPARISON:  Chest radiograph 01/22/2022. FINDINGS: Two views. There is no evidence of thoracic spine fracture. Alignment is normal. Multilevel degenerative changes with calcified discs and flowing anterior osteophytes. IMPRESSION: No evidence of thoracic spine fracture or traumatic malalignment. Electronically  Signed   By: Orvan Falconer M.D.   On: 08/29/2022 15:29   DG Lumbar Spine Complete  Result Date: 08/29/2022 CLINICAL DATA:  Fall. EXAM: LUMBAR SPINE - COMPLETE 4+ VIEW COMPARISON:  MRI lumbar spine 12/27/2019. FINDINGS: Five views. Postoperative changes of L4-S1 posterior spinal and interbody fusion. Hardware is intact. New mild compression deformity of the L1 vertebral body. Unchanged degenerative endplate sclerosis along the superior L2 endplate. IMPRESSION: New mild compression deformity of the L1 vertebral body. Electronically Signed   By: Orvan Falconer M.D.   On: 08/29/2022 15:26   DG Pelvis 1-2 Views  Result Date: 08/29/2022 CLINICAL DATA:  Larey Seat last night.  Found down. EXAM: PELVIS - 1-2 VIEW COMPARISON:  Abdominal film 01/29/2022 FINDINGS: Both hips are normally located. No acute hip fracture. The pubic symphysis and SI joints are intact. No definite pelvic fractures. Lumbosacral fusion hardware noted. IMPRESSION: No acute bony findings. Electronically Signed   By: Rudie Meyer M.D.   On: 08/29/2022 15:26      Subjective: Patient seen and examined at bedside today.  Hemodynamically stable.  Eating her breakfast.  Denies abdomen pain.  Diarrhea is slowing down.  No nausea or vomiting.  Medically stable for discharge to SNF today.  I called her son and discussed about discharge planning  Discharge Exam: Vitals:   09/03/22 2026 09/04/22 0326  BP: 120/67 (!) 111/55  Pulse: 62 (!) 56  Resp: 16 16  Temp: 98.2 F (36.8 C) 98.2 F (36.8 C)  SpO2: 100% 98%   Vitals:   09/03/22 1403 09/03/22 2026 09/04/22 0326 09/04/22 0500  BP: 127/68 120/67 (!) 111/55   Pulse: 60 62 (!) 56   Resp: 18 16 16    Temp: 97.6 F (36.4 C) 98.2 F (36.8 C) 98.2 F (36.8 C)   TempSrc: Oral Oral Oral   SpO2: 100% 100% 98%   Weight:    68.5 kg  Height:        General: Pt is alert, awake, not in acute distress, appears deconditioned, weak Cardiovascular: RRR, S1/S2 +, no rubs, no gallops Respiratory:  CTA bilaterally, no wheezing, no rhonchi Abdominal: Soft, NT, ND, bowel sounds + Extremities: no edema, no cyanosis    The results of significant diagnostics from this hospitalization (including imaging, microbiology,  ancillary and laboratory) are listed below for reference.     Microbiology: Recent Results (from the past 240 hour(s))  C Difficile Quick Screen w PCR reflex     Status: Abnormal   Collection Time: 08/30/22 11:00 AM   Specimen: Stool  Result Value Ref Range Status   C Diff antigen POSITIVE (A) NEGATIVE Final   C Diff toxin NEGATIVE NEGATIVE Final   C Diff interpretation Results are indeterminate. See PCR results.  Final    Comment: Performed at [facility], [address] [place], [place] [address]  Gastrointestinal Panel by PCR , Stool     Status: None   Collection Time: 08/30/22 11:00 AM   Specimen: Stool  Result Value Ref Range Status   Campylobacter species NOT DETECTED NOT DETECTED Final   Plesimonas shigelloides NOT DETECTED NOT DETECTED Final   Salmonella species NOT DETECTED NOT DETECTED Final   Yersinia enterocolitica NOT DETECTED NOT DETECTED Final   Vibrio species NOT DETECTED NOT DETECTED Final   Vibrio cholerae NOT DETECTED NOT DETECTED Final   Enteroaggregative E coli (EAEC) NOT DETECTED NOT DETECTED Final   Enteropathogenic E coli (EPEC) NOT DETECTED NOT DETECTED Final   Enterotoxigenic E coli (ETEC) NOT DETECTED NOT DETECTED Final   Shiga like toxin producing E coli (STEC) NOT DETECTED NOT DETECTED Final   Shigella/Enteroinvasive E coli (EIEC) NOT DETECTED NOT DETECTED Final   Cryptosporidium NOT DETECTED NOT DETECTED Final   Cyclospora cayetanensis NOT DETECTED NOT DETECTED Final   Entamoeba histolytica NOT DETECTED NOT DETECTED Final   Giardia lamblia NOT DETECTED NOT DETECTED Final   Adenovirus F40/41 NOT DETECTED NOT DETECTED Final   Astrovirus NOT DETECTED NOT DETECTED Final   Norovirus GI/GII NOT DETECTED NOT DETECTED Final   Rotavirus A  NOT DETECTED NOT DETECTED Final   Sapovirus (I, II, IV, and V) NOT DETECTED NOT DETECTED Final    Comment: Performed at [facility], [address] [place], [place] [address]  C. Diff by PCR, Reflexed     Status: Abnormal   Collection Time: 08/30/22 11:00 AM  Result Value Ref Range Status   Toxigenic C. Difficile by PCR POSITIVE (A) NEGATIVE Final    Comment: Positive for toxigenic C. difficile with little to no toxin production. Only treat if clinical presentation suggests symptomatic illness. Performed at [facility], [address], [place], [place] [address]      Labs: BNP (last 3 results) Recent Labs    07/06/22 1850  BNP 264.0*   Basic Metabolic Panel: Recent Labs  Lab 08/30/22 0402 08/31/22 0757 09/01/22 0352 09/01/22 1529 09/02/22 0453 09/03/22 0403 09/04/22 0436  NA 139 136 136  --  134* 134* 135  K 4.0 3.5 2.8* 4.1 3.4* 3.4* 3.2*  CL 114* 113* 114*  --  113* 114* 113*  CO2 18* 18* 19*  --  18* 18* 19*  GLUCOSE 69* 84 80  --  73 65* 82  BUN 36* 29* 32*  --  32* 33* 34*  CREATININE 1.50* 1.35* 1.32*  --  1.30* 1.34* 1.32*  CALCIUM 8.5* 8.0* 8.0*  --  7.9* 7.7* 7.6*  MG 1.7  --   --   --   --  1.4* 1.7  PHOS 3.9  --   --   --   --   --   --    Liver Function Tests: Recent Labs  Lab 08/29/22 1548 08/30/22 0402  AST 38 30  ALT 32 26  ALKPHOS  141* 114  BILITOT 1.0 0.8  PROT 6.4* 5.3*  ALBUMIN 3.3* 2.6*   No results for input(s): "LIPASE", "AMYLASE" in the last 168 hours. No results for input(s): "AMMONIA" in the last 168 hours. CBC: Recent Labs  Lab 08/29/22 1548 08/30/22 0402 09/02/22 0453  WBC 10.8* 8.5 6.9  NEUTROABS 8.9*  --   --   HGB 11.6* 9.5* 9.1*  HCT 36.1 30.5* 29.8*  MCV 98.9 102.0* 102.1*  PLT 345 281 247   Cardiac Enzymes: Recent Labs  Lab 08/29/22 1548  CKTOTAL 408*   BNP: Invalid input(s): "POCBNP" CBG: Recent Labs  Lab 08/30/22 1726 08/31/22 0602 09/01/22 0736 09/02/22 0737 09/04/22 0803  GLUCAP  119* 82 81 80 86   D-Dimer No results for input(s): "DDIMER" in the last 72 hours. Hgb A1c No results for input(s): "HGBA1C" in the last 72 hours. Lipid Profile No results for input(s): "CHOL", "HDL", "LDLCALC", "TRIG", "CHOLHDL", "LDLDIRECT" in the last 72 hours. Thyroid function studies No results for input(s): "TSH", "T4TOTAL", "T3FREE", "THYROIDAB" in the last 72 hours.  Invalid input(s): "FREET3" Anemia work up No results for input(s): "VITAMINB12", "FOLATE", "FERRITIN", "TIBC", "IRON", "RETICCTPCT" in the last 72 hours. Urinalysis    Component Value Date/Time   COLORURINE YELLOW 08/31/2022 0102   APPEARANCEUR CLEAR 08/31/2022 0102   APPEARANCEUR Clear 03/01/2021 1257   LABSPEC 1.012 08/31/2022 0102   PHURINE 5.0 08/31/2022 0102   GLUCOSEU NEGATIVE 08/31/2022 0102   HGBUR NEGATIVE 08/31/2022 0102   BILIRUBINUR NEGATIVE 08/31/2022 0102   BILIRUBINUR Negative 03/01/2021 1257   KETONESUR NEGATIVE 08/31/2022 0102   PROTEINUR NEGATIVE 08/31/2022 0102   NITRITE NEGATIVE 08/31/2022 0102   LEUKOCYTESUR TRACE (A) 08/31/2022 0102   Sepsis Labs Recent Labs  Lab 08/29/22 1548 08/30/22 0402 09/02/22 0453  WBC 10.8* 8.5 6.9   Microbiology Recent Results (from the past 240 hour(s))  C Difficile Quick Screen w PCR reflex     Status: Abnormal   Collection Time: 08/30/22 11:00 AM   Specimen: Stool  Result Value Ref Range Status   C Diff antigen POSITIVE (A) NEGATIVE Final   C Diff toxin NEGATIVE NEGATIVE Final   C Diff interpretation Results are indeterminate. See PCR results.  Final    Comment: Performed at [facility], [address] [place], [place] [address]  Gastrointestinal Panel by PCR , Stool     Status: None   Collection Time: 08/30/22 11:00 AM   Specimen: Stool  Result Value Ref Range Status   Campylobacter species NOT DETECTED NOT DETECTED Final   Plesimonas shigelloides NOT DETECTED NOT DETECTED Final   Salmonella species NOT DETECTED NOT DETECTED Final    Yersinia enterocolitica NOT DETECTED NOT DETECTED Final   Vibrio species NOT DETECTED NOT DETECTED Final   Vibrio cholerae NOT DETECTED NOT DETECTED Final   Enteroaggregative E coli (EAEC) NOT DETECTED NOT DETECTED Final   Enteropathogenic E coli (EPEC) NOT DETECTED NOT DETECTED Final   Enterotoxigenic E coli (ETEC) NOT DETECTED NOT DETECTED Final   Shiga like toxin producing E coli (STEC) NOT DETECTED NOT DETECTED Final   Shigella/Enteroinvasive E coli (EIEC) NOT DETECTED NOT DETECTED Final   Cryptosporidium NOT DETECTED NOT DETECTED Final   Cyclospora cayetanensis NOT DETECTED NOT DETECTED Final   Entamoeba histolytica NOT DETECTED NOT DETECTED Final   Giardia lamblia NOT DETECTED NOT DETECTED Final   Adenovirus F40/41 NOT DETECTED NOT DETECTED Final   Astrovirus NOT DETECTED NOT DETECTED Final   Norovirus GI/GII NOT DETECTED NOT DETECTED Final  Rotavirus A NOT DETECTED NOT DETECTED Final   Sapovirus (I, II, IV, and V) NOT DETECTED NOT DETECTED Final    Comment: Performed at [facility], [address] [place], [place] [address]  C. Diff by PCR, Reflexed     Status: Abnormal   Collection Time: 08/30/22 11:00 AM  Result Value Ref Range Status   Toxigenic C. Difficile by PCR POSITIVE (A) NEGATIVE Final    Comment: Positive for toxigenic C. difficile with little to no toxin production. Only treat if clinical presentation suggests symptomatic illness. Performed at [facility], [address], [place], [place] [address]     Please note: You were cared for by a hospitalist during your hospital stay. Once you are discharged, your primary care physician will handle any further medical issues. Please note that NO REFILLS for any discharge medications will be authorized once you are discharged, as it is imperative that you return to your primary care physician (or establish a relationship with a primary care physician if you do not have one) for your post hospital discharge  needs so that they can reassess your need for medications and monitor your lab values.    Time coordinating discharge: 40 minutes  SIGNED:   Burnadette Pop, MD  [facility] 09/04/2022, 10:40 AM Pager [phone]  If 7PM-7AM, please contact night-coverage [email or web address] Password [email or web address]

## 2022-09-06 DIAGNOSIS — W19XXXD Unspecified fall, subsequent encounter: Secondary | ICD-10-CM | POA: Diagnosis not present

## 2022-09-06 DIAGNOSIS — R531 Weakness: Secondary | ICD-10-CM | POA: Diagnosis not present

## 2022-09-06 DIAGNOSIS — Y92009 Unspecified place in unspecified non-institutional (private) residence as the place of occurrence of the external cause: Secondary | ICD-10-CM | POA: Diagnosis not present

## 2022-09-06 DIAGNOSIS — E876 Hypokalemia: Secondary | ICD-10-CM | POA: Diagnosis not present

## 2022-09-06 DIAGNOSIS — A0472 Enterocolitis due to Clostridium difficile, not specified as recurrent: Secondary | ICD-10-CM | POA: Diagnosis not present

## 2022-09-06 DIAGNOSIS — G2581 Restless legs syndrome: Secondary | ICD-10-CM | POA: Diagnosis not present

## 2022-09-06 DIAGNOSIS — S32010D Wedge compression fracture of first lumbar vertebra, subsequent encounter for fracture with routine healing: Secondary | ICD-10-CM | POA: Diagnosis not present

## 2022-09-06 DIAGNOSIS — R52 Pain, unspecified: Secondary | ICD-10-CM | POA: Diagnosis not present

## 2022-09-06 DIAGNOSIS — T796XXD Traumatic ischemia of muscle, subsequent encounter: Secondary | ICD-10-CM | POA: Diagnosis not present

## 2022-09-06 DIAGNOSIS — Z79899 Other long term (current) drug therapy: Secondary | ICD-10-CM | POA: Diagnosis not present

## 2022-09-09 DIAGNOSIS — K219 Gastro-esophageal reflux disease without esophagitis: Secondary | ICD-10-CM | POA: Diagnosis not present

## 2022-09-09 DIAGNOSIS — Z79899 Other long term (current) drug therapy: Secondary | ICD-10-CM | POA: Diagnosis not present

## 2022-09-09 DIAGNOSIS — R627 Adult failure to thrive: Secondary | ICD-10-CM | POA: Diagnosis not present

## 2022-09-09 DIAGNOSIS — A0472 Enterocolitis due to Clostridium difficile, not specified as recurrent: Secondary | ICD-10-CM | POA: Diagnosis not present

## 2022-09-09 DIAGNOSIS — I890 Lymphedema, not elsewhere classified: Secondary | ICD-10-CM | POA: Diagnosis not present

## 2022-09-09 DIAGNOSIS — R197 Diarrhea, unspecified: Secondary | ICD-10-CM | POA: Diagnosis not present

## 2022-09-10 DIAGNOSIS — E039 Hypothyroidism, unspecified: Secondary | ICD-10-CM | POA: Diagnosis not present

## 2022-09-10 DIAGNOSIS — R52 Pain, unspecified: Secondary | ICD-10-CM | POA: Diagnosis not present

## 2022-09-10 DIAGNOSIS — E559 Vitamin D deficiency, unspecified: Secondary | ICD-10-CM | POA: Diagnosis not present

## 2022-09-10 DIAGNOSIS — N1832 Chronic kidney disease, stage 3b: Secondary | ICD-10-CM | POA: Diagnosis not present

## 2022-09-10 DIAGNOSIS — E8809 Other disorders of plasma-protein metabolism, not elsewhere classified: Secondary | ICD-10-CM | POA: Diagnosis not present

## 2022-09-10 DIAGNOSIS — S31000D Unspecified open wound of lower back and pelvis without penetration into retroperitoneum, subsequent encounter: Secondary | ICD-10-CM | POA: Diagnosis not present

## 2022-09-10 DIAGNOSIS — D649 Anemia, unspecified: Secondary | ICD-10-CM | POA: Diagnosis not present

## 2022-09-11 DIAGNOSIS — F411 Generalized anxiety disorder: Secondary | ICD-10-CM | POA: Diagnosis not present

## 2022-09-11 DIAGNOSIS — N1832 Chronic kidney disease, stage 3b: Secondary | ICD-10-CM | POA: Diagnosis not present

## 2022-09-11 DIAGNOSIS — K219 Gastro-esophageal reflux disease without esophagitis: Secondary | ICD-10-CM | POA: Diagnosis not present

## 2022-09-11 DIAGNOSIS — S31000D Unspecified open wound of lower back and pelvis without penetration into retroperitoneum, subsequent encounter: Secondary | ICD-10-CM | POA: Diagnosis not present

## 2022-09-11 DIAGNOSIS — S32010D Wedge compression fracture of first lumbar vertebra, subsequent encounter for fracture with routine healing: Secondary | ICD-10-CM | POA: Diagnosis not present

## 2022-09-11 DIAGNOSIS — T796XXD Traumatic ischemia of muscle, subsequent encounter: Secondary | ICD-10-CM | POA: Diagnosis not present

## 2022-09-11 DIAGNOSIS — A0472 Enterocolitis due to Clostridium difficile, not specified as recurrent: Secondary | ICD-10-CM | POA: Diagnosis not present

## 2022-09-11 DIAGNOSIS — R531 Weakness: Secondary | ICD-10-CM | POA: Diagnosis not present

## 2022-09-11 DIAGNOSIS — E559 Vitamin D deficiency, unspecified: Secondary | ICD-10-CM | POA: Diagnosis not present

## 2022-09-11 DIAGNOSIS — E8809 Other disorders of plasma-protein metabolism, not elsewhere classified: Secondary | ICD-10-CM | POA: Diagnosis not present

## 2022-09-11 DIAGNOSIS — W19XXXD Unspecified fall, subsequent encounter: Secondary | ICD-10-CM | POA: Diagnosis not present

## 2022-09-11 DIAGNOSIS — E039 Hypothyroidism, unspecified: Secondary | ICD-10-CM | POA: Diagnosis not present

## 2022-09-16 DIAGNOSIS — L089 Local infection of the skin and subcutaneous tissue, unspecified: Secondary | ICD-10-CM | POA: Diagnosis not present

## 2022-09-16 DIAGNOSIS — S31000D Unspecified open wound of lower back and pelvis without penetration into retroperitoneum, subsequent encounter: Secondary | ICD-10-CM | POA: Diagnosis not present

## 2022-09-23 ENCOUNTER — Emergency Department (HOSPITAL_COMMUNITY)
Admission: EM | Admit: 2022-09-23 | Discharge: 2022-09-24 | Disposition: A | Discharge status: Skilled Nursing Facility | Payer: Medicare Other | Attending: Emergency Medicine | Admitting: Emergency Medicine

## 2022-09-23 ENCOUNTER — Other Ambulatory Visit: Payer: Self-pay

## 2022-09-23 DIAGNOSIS — S161XXA Strain of muscle, fascia and tendon at neck level, initial encounter: Secondary | ICD-10-CM

## 2022-09-23 DIAGNOSIS — E039 Hypothyroidism, unspecified: Secondary | ICD-10-CM | POA: Diagnosis not present

## 2022-09-23 DIAGNOSIS — E162 Hypoglycemia, unspecified: Secondary | ICD-10-CM

## 2022-09-23 DIAGNOSIS — Z79890 Hormone replacement therapy (postmenopausal): Secondary | ICD-10-CM | POA: Insufficient documentation

## 2022-09-23 DIAGNOSIS — S0990XA Unspecified injury of head, initial encounter: Secondary | ICD-10-CM | POA: Diagnosis not present

## 2022-09-23 DIAGNOSIS — Z91040 Latex allergy status: Secondary | ICD-10-CM | POA: Insufficient documentation

## 2022-09-23 DIAGNOSIS — I10 Essential (primary) hypertension: Secondary | ICD-10-CM | POA: Diagnosis not present

## 2022-09-23 DIAGNOSIS — R4182 Altered mental status, unspecified: Secondary | ICD-10-CM | POA: Insufficient documentation

## 2022-09-23 DIAGNOSIS — E11649 Type 2 diabetes mellitus with hypoglycemia without coma: Secondary | ICD-10-CM | POA: Diagnosis not present

## 2022-09-23 LAB — CBG MONITORING, ED: Glucose-Capillary: 63 mg/dL — ABNORMAL LOW (ref 70–99)

## 2022-09-23 NOTE — ED Triage Notes (Signed)
Pt BIB [facility] from [facility] c/o AMS per ems facility states " pt keeps moving and its getting on their nerves". EMS reports they received conflicting stories from two different RN's one stating she found pt in floor around 2100, the other stating pt did not fall. EMS placed pt in C-collar. CBG in route was 46 PTA, two tubes oral glucose given PTA. CBG 63 in triage.  Pt has pressure ulcer on sacrum, per EMS pt finished IV antibiotics today.

## 2022-09-24 ENCOUNTER — Emergency Department (HOSPITAL_COMMUNITY): Payer: Medicare Other

## 2022-09-24 DIAGNOSIS — M25511 Pain in right shoulder: Secondary | ICD-10-CM | POA: Diagnosis not present

## 2022-09-24 DIAGNOSIS — Z79899 Other long term (current) drug therapy: Secondary | ICD-10-CM | POA: Diagnosis not present

## 2022-09-24 DIAGNOSIS — Z9181 History of falling: Secondary | ICD-10-CM | POA: Diagnosis not present

## 2022-09-24 DIAGNOSIS — R4182 Altered mental status, unspecified: Secondary | ICD-10-CM | POA: Diagnosis not present

## 2022-09-24 DIAGNOSIS — M25551 Pain in right hip: Secondary | ICD-10-CM | POA: Diagnosis not present

## 2022-09-24 LAB — CBC
HCT: 32.6 % — ABNORMAL LOW (ref 36.0–46.0)
Hemoglobin: 10.3 g/dL — ABNORMAL LOW (ref 12.0–15.0)
MCH: 30.9 pg (ref 26.0–34.0)
MCHC: 31.6 g/dL (ref 30.0–36.0)
MCV: 97.9 fL (ref 80.0–100.0)
Platelets: 316 10*3/uL (ref 150–400)
RBC: 3.33 MIL/uL — ABNORMAL LOW (ref 3.87–5.11)
RDW: 12.5 % (ref 11.5–15.5)
WBC: 9.4 10*3/uL (ref 4.0–10.5)
nRBC: 0 % (ref 0.0–0.2)

## 2022-09-24 LAB — COMPREHENSIVE METABOLIC PANEL
ALT: 14 U/L (ref 0–44)
AST: 23 U/L (ref 15–41)
Albumin: 2.6 g/dL — ABNORMAL LOW (ref 3.5–5.0)
Alkaline Phosphatase: 175 U/L — ABNORMAL HIGH (ref 38–126)
Anion gap: 8 (ref 5–15)
BUN: 21 mg/dL (ref 8–23)
CO2: 32 mmol/L (ref 22–32)
Calcium: 8.1 mg/dL — ABNORMAL LOW (ref 8.9–10.3)
Chloride: 96 mmol/L — ABNORMAL LOW (ref 98–111)
Creatinine, Ser: 1.42 mg/dL — ABNORMAL HIGH (ref 0.44–1.00)
GFR, Estimated: 38 mL/min — ABNORMAL LOW (ref >60)
Glucose, Bld: 101 mg/dL — ABNORMAL HIGH (ref 70–99)
Potassium: 4.1 mmol/L (ref 3.5–5.1)
Sodium: 136 mmol/L (ref 135–145)
Total Bilirubin: 0.7 mg/dL (ref 0.3–1.2)
Total Protein: 6.3 g/dL — ABNORMAL LOW (ref 6.5–8.1)

## 2022-09-24 LAB — CBG MONITORING, ED
Glucose-Capillary: 173 mg/dL — ABNORMAL HIGH (ref 70–99)
Glucose-Capillary: 103 mg/dL — ABNORMAL HIGH (ref 70–99)
Glucose-Capillary: 61 mg/dL — ABNORMAL LOW (ref 70–99)
Glucose-Capillary: 66 mg/dL — ABNORMAL LOW (ref 70–99)
Glucose-Capillary: 140 mg/dL — ABNORMAL HIGH (ref 70–99)
Glucose-Capillary: 147 mg/dL — ABNORMAL HIGH (ref 70–99)
Glucose-Capillary: 125 mg/dL — ABNORMAL HIGH (ref 70–99)

## 2022-09-24 MED ORDER — DEXTROSE 50 % IV SOLN
1.0000 | Freq: Once | INTRAVENOUS | Status: AC
  Administered 2022-09-24: 50 mL via INTRAVENOUS
  Filled 2022-09-24: qty 50

## 2022-09-24 MED ORDER — DEXTROSE 50 % IV SOLN: 50.0000 mL | Freq: Once | INTRAVENOUS | 0 refills | Status: DC

## 2022-09-24 NOTE — ED Notes (Signed)
ED Provider at bedside. 

## 2022-09-24 NOTE — Discharge Instructions (Signed)
Continue medications as previously prescribed.  Return to the emergency department for any new or concerning issues.

## 2022-09-24 NOTE — ED Notes (Signed)
Notified [facility] needing transportation back to [facility] nursing facility.

## 2022-09-24 NOTE — ED Provider Notes (Signed)
[place] EMERGENCY DEPARTMENT AT [facility] Provider Note   CSN: [id number] Arrival date & time: 09/23/22  2342     History  Chief Complaint  Patient presents with   Altered Mental Status    [name] is a 77 y.o. female.  Patient is a 77 year old female with past medical history of hypertension, hypothyroidism, restless legs, and recent admission for rhabdomyolysis and metabolic encephalopathy.  Patient was discharged to an extended care facility 2 weeks ago.  She was sent here today for evaluation of altered mental status.  The details of what transpired at the [facility] are somewhat vague, but from what I am told the patient was found on the floor and was altered.  Her blood sugar was reported to have been 44.  She was given oral glucose and upon repeat was 56.  Patient appears somewhat confused and disoriented and history is otherwise somewhat limited.  The history is provided by the patient.       Home Medications Prior to Admission medications   Medication Sig Start Date End Date Taking? Authorizing Provider  cholestyramine (QUESTRAN) 4 g packet Take 1 packet (4 g total) by mouth 3 (three) times daily with meals. 05/30/22   Daphine Deutscher, Mary-Margaret, FNP  citalopram (CELEXA) 40 MG tablet Take 1 tablet (40 mg total) by mouth daily. 05/06/22   Daphine Deutscher, Mary-Margaret, FNP  dicyclomine (BENTYL) 10 MG capsule Take 1 capsule (10 mg total) by mouth every 8 (eight) hours as needed for spasms. TAKE ONE CAPSULE BY MOUTH THREE TIMES DAILY BEFORE meals 09/04/22   Burnadette Pop, MD  leptospermum manuka honey (MEDIHONEY) PSTE paste Apply 1 Application topically daily. 09/05/22   Burnadette Pop, MD  levothyroxine (SYNTHROID) 75 MCG tablet Take 1 tablet (75 mcg total) by mouth daily. 05/07/22   Daphine Deutscher, Mary-Margaret, FNP  loperamide (IMODIUM) 2 MG capsule Take 1 capsule (2 mg total) by mouth every 6 (six) hours as needed for diarrhea or loose stools. 09/04/22   Burnadette Pop, MD   Magnesium Oxide 400 MG CAPS Take 1 capsule (400 mg total) by mouth daily. 09/04/22   Burnadette Pop, MD  nystatin (MYCOSTATIN/NYSTOP) powder Apply topically 3 (three) times daily. On groins 09/04/22   Burnadette Pop, MD  potassium chloride (KLOR-CON) 20 MEQ packet Take 40 mEq by mouth daily for 7 days. 09/05/22 09/12/22  Burnadette Pop, MD  potassium chloride (MICRO-K) 10 MEQ CR capsule Take 1 capsule (10 mEq total) by mouth 2 (two) times daily. 09/12/22   Burnadette Pop, MD  rOPINIRole (REQUIP) 3 MG tablet Take 1 tablet (3 mg total) by mouth at bedtime. 05/06/22   Daphine Deutscher, Mary-Margaret, FNP  sodium bicarbonate 650 MG tablet Take 1 tablet (650 mg total) by mouth 3 (three) times daily. 09/04/22 10/04/22  Burnadette Pop, MD      Allergies    Celebrex [celecoxib], Keflet [cephalexin], Penicillins, Sulfa antibiotics, Kenalog [triamcinolone acetonide], Latex, Lisinopril, Mobic [meloxicam], and Penicillin g    Review of Systems   Review of Systems  Unable to perform ROS: Mental status change    Physical Exam Updated Vital Signs BP 138/68   Pulse 78   Temp 98.1 F (36.7 C) (Oral)   Resp 19   Ht 5\' 2"  (1.575 m)   Wt 68 kg   SpO2 100%   BMI 27.42 kg/m  Physical Exam Vitals and nursing note reviewed.  Constitutional:      General: She is not in acute distress.    Appearance: She is well-developed. She is  not diaphoretic.     Comments: Patient is awake and alert.  She seems somewhat confused and disoriented.  HENT:     Head: Normocephalic and atraumatic.  Eyes:     Extraocular Movements: Extraocular movements intact.     Pupils: Pupils are equal, round, and reactive to light.  Cardiovascular:     Rate and Rhythm: Normal rate and regular rhythm.     Heart sounds: No murmur heard.    No friction rub. No gallop.  Pulmonary:     Effort: Pulmonary effort is normal. No respiratory distress.     Breath sounds: Normal breath sounds. No wheezing.  Abdominal:     General: Bowel sounds are  normal. There is no distension.     Palpations: Abdomen is soft.     Tenderness: There is no abdominal tenderness.  Musculoskeletal:        General: Normal range of motion.     Cervical back: Normal range of motion and neck supple.     Comments: She has good range of motion of both hips with no tenderness to palpation.  Skin:    General: Skin is warm and dry.  Neurological:     General: No focal deficit present.     Comments: Patient moving all extremities.  She appears somewhat confused but no other obvious focal deficits.  Exam is somewhat limited due to AMS.     ED Results / Procedures / Treatments   Labs (all labs ordered are listed, but only abnormal results are displayed) Labs Reviewed  COMPREHENSIVE METABOLIC PANEL - Abnormal; Notable for the following components:      Result Value   Chloride 96 (*)    Glucose, Bld 101 (*)    Creatinine, Ser 1.42 (*)    Calcium 8.1 (*)    Total Protein 6.3 (*)    Albumin 2.6 (*)    Alkaline Phosphatase 175 (*)    GFR, Estimated 38 (*)    All other components within normal limits  CBC - Abnormal; Notable for the following components:   RBC 3.33 (*)    Hemoglobin 10.3 (*)    HCT 32.6 (*)    All other components within normal limits  CBG MONITORING, ED - Abnormal; Notable for the following components:   Glucose-Capillary 63 (*)    All other components within normal limits  CBG MONITORING, ED    EKG None  Radiology No results found.  Procedures Procedures    Medications Ordered in ED Medications  dextrose 50 % solution 50 mL (50 mLs Intravenous Given 09/24/22 0021)    ED Course/ Medical Decision Making/ A&P  Patient is a 77 year old female sent from her extended care facility for evaluation of a fall.  She was apparently found on the floor and seemed confused.  She was hypoglycemic upon EMS arrival.  Patient denies to me she is having any significant discomfort.  She arrives with stable vital signs and appears  neurologically intact.  Workup initiated including CBC, metabolic panel, both of which were unremarkable.  I obtained a CT scan of the head and CT scan of the cervical spine, both of which are free of traumatic pathology.  At this point, patient's blood sugar seems to be maintaining and workup unremarkable.  I feel as though she can safely be discharged back to her extended care facility.  Final Clinical Impression(s) / ED Diagnoses Final diagnoses:  None    Rx / DC Orders ED Discharge Orders  None         Geoffery Lyons, MD 09/24/22 [phone]

## 2022-09-26 ENCOUNTER — Other Ambulatory Visit: Payer: Self-pay

## 2022-09-26 ENCOUNTER — Inpatient Hospital Stay (HOSPITAL_COMMUNITY)
Admission: EM | Admit: 2022-09-26 | Discharge: 2022-10-01 | DRG: 640 | Disposition: A | Discharge status: Skilled Nursing Facility | Payer: Medicare Other | Source: Skilled Nursing Facility | Attending: Family Medicine | Admitting: Family Medicine

## 2022-09-26 ENCOUNTER — Encounter (HOSPITAL_COMMUNITY): Payer: Self-pay | Admitting: Family Medicine

## 2022-09-26 ENCOUNTER — Emergency Department (HOSPITAL_COMMUNITY): Payer: Medicare Other

## 2022-09-26 DIAGNOSIS — E039 Hypothyroidism, unspecified: Secondary | ICD-10-CM | POA: Diagnosis present

## 2022-09-26 DIAGNOSIS — R410 Disorientation, unspecified: Secondary | ICD-10-CM | POA: Diagnosis not present

## 2022-09-26 DIAGNOSIS — E785 Hyperlipidemia, unspecified: Secondary | ICD-10-CM | POA: Diagnosis present

## 2022-09-26 DIAGNOSIS — F419 Anxiety disorder, unspecified: Secondary | ICD-10-CM | POA: Diagnosis present

## 2022-09-26 DIAGNOSIS — K219 Gastro-esophageal reflux disease without esophagitis: Secondary | ICD-10-CM | POA: Diagnosis present

## 2022-09-26 DIAGNOSIS — E162 Hypoglycemia, unspecified: Secondary | ICD-10-CM | POA: Diagnosis not present

## 2022-09-26 DIAGNOSIS — J90 Pleural effusion, not elsewhere classified: Secondary | ICD-10-CM | POA: Diagnosis not present

## 2022-09-26 DIAGNOSIS — M810 Age-related osteoporosis without current pathological fracture: Secondary | ICD-10-CM | POA: Diagnosis present

## 2022-09-26 DIAGNOSIS — Z6827 Body mass index (BMI) 27.0-27.9, adult: Secondary | ICD-10-CM

## 2022-09-26 DIAGNOSIS — G9341 Metabolic encephalopathy: Secondary | ICD-10-CM | POA: Diagnosis present

## 2022-09-26 DIAGNOSIS — G2581 Restless legs syndrome: Secondary | ICD-10-CM | POA: Diagnosis present

## 2022-09-26 DIAGNOSIS — I4891 Unspecified atrial fibrillation: Secondary | ICD-10-CM | POA: Diagnosis present

## 2022-09-26 DIAGNOSIS — J449 Chronic obstructive pulmonary disease, unspecified: Secondary | ICD-10-CM | POA: Diagnosis present

## 2022-09-26 DIAGNOSIS — R4182 Altered mental status, unspecified: Secondary | ICD-10-CM | POA: Diagnosis not present

## 2022-09-26 DIAGNOSIS — F32A Depression, unspecified: Secondary | ICD-10-CM | POA: Diagnosis not present

## 2022-09-26 DIAGNOSIS — Z515 Encounter for palliative care: Secondary | ICD-10-CM | POA: Diagnosis not present

## 2022-09-26 DIAGNOSIS — L89622 Pressure ulcer of left heel, stage 2: Secondary | ICD-10-CM | POA: Diagnosis present

## 2022-09-26 DIAGNOSIS — D631 Anemia in chronic kidney disease: Secondary | ICD-10-CM | POA: Diagnosis present

## 2022-09-26 DIAGNOSIS — I130 Hypertensive heart and chronic kidney disease with heart failure and stage 1 through stage 4 chronic kidney disease, or unspecified chronic kidney disease: Secondary | ICD-10-CM | POA: Diagnosis not present

## 2022-09-26 DIAGNOSIS — Z90710 Acquired absence of both cervix and uterus: Secondary | ICD-10-CM

## 2022-09-26 DIAGNOSIS — R627 Adult failure to thrive: Secondary | ICD-10-CM | POA: Diagnosis not present

## 2022-09-26 DIAGNOSIS — N1832 Chronic kidney disease, stage 3b: Secondary | ICD-10-CM | POA: Diagnosis present

## 2022-09-26 DIAGNOSIS — Z79890 Hormone replacement therapy (postmenopausal): Secondary | ICD-10-CM

## 2022-09-26 DIAGNOSIS — R279 Unspecified lack of coordination: Secondary | ICD-10-CM | POA: Diagnosis not present

## 2022-09-26 DIAGNOSIS — J4489 Other specified chronic obstructive pulmonary disease: Secondary | ICD-10-CM | POA: Diagnosis present

## 2022-09-26 DIAGNOSIS — A0472 Enterocolitis due to Clostridium difficile, not specified as recurrent: Secondary | ICD-10-CM | POA: Diagnosis present

## 2022-09-26 DIAGNOSIS — L89324 Pressure ulcer of left buttock, stage 4: Secondary | ICD-10-CM | POA: Diagnosis present

## 2022-09-26 DIAGNOSIS — Z9049 Acquired absence of other specified parts of digestive tract: Secondary | ICD-10-CM

## 2022-09-26 DIAGNOSIS — I129 Hypertensive chronic kidney disease with stage 1 through stage 4 chronic kidney disease, or unspecified chronic kidney disease: Secondary | ICD-10-CM | POA: Diagnosis present

## 2022-09-26 DIAGNOSIS — Z66 Do not resuscitate: Secondary | ICD-10-CM | POA: Diagnosis present

## 2022-09-26 DIAGNOSIS — F03918 Unspecified dementia, unspecified severity, with other behavioral disturbance: Secondary | ICD-10-CM | POA: Diagnosis not present

## 2022-09-26 DIAGNOSIS — I82402 Acute embolism and thrombosis of unspecified deep veins of left lower extremity: Secondary | ICD-10-CM | POA: Diagnosis not present

## 2022-09-26 DIAGNOSIS — Z91040 Latex allergy status: Secondary | ICD-10-CM

## 2022-09-26 DIAGNOSIS — R001 Bradycardia, unspecified: Secondary | ICD-10-CM | POA: Diagnosis not present

## 2022-09-26 DIAGNOSIS — R296 Repeated falls: Secondary | ICD-10-CM | POA: Diagnosis present

## 2022-09-26 DIAGNOSIS — R404 Transient alteration of awareness: Secondary | ICD-10-CM | POA: Diagnosis not present

## 2022-09-26 DIAGNOSIS — Z880 Allergy status to penicillin: Secondary | ICD-10-CM

## 2022-09-26 DIAGNOSIS — I10 Essential (primary) hypertension: Secondary | ICD-10-CM | POA: Diagnosis not present

## 2022-09-26 DIAGNOSIS — Z743 Need for continuous supervision: Secondary | ICD-10-CM | POA: Diagnosis not present

## 2022-09-26 DIAGNOSIS — F0390 Unspecified dementia without behavioral disturbance: Secondary | ICD-10-CM | POA: Diagnosis present

## 2022-09-26 DIAGNOSIS — E11649 Type 2 diabetes mellitus with hypoglycemia without coma: Secondary | ICD-10-CM | POA: Diagnosis not present

## 2022-09-26 DIAGNOSIS — L89620 Pressure ulcer of left heel, unstageable: Secondary | ICD-10-CM | POA: Diagnosis not present

## 2022-09-26 DIAGNOSIS — L8990 Pressure ulcer of unspecified site, unspecified stage: Secondary | ICD-10-CM | POA: Diagnosis present

## 2022-09-26 DIAGNOSIS — W19XXXA Unspecified fall, initial encounter: Secondary | ICD-10-CM | POA: Diagnosis present

## 2022-09-26 DIAGNOSIS — R52 Pain, unspecified: Secondary | ICD-10-CM | POA: Diagnosis not present

## 2022-09-26 DIAGNOSIS — R633 Feeding difficulties, unspecified: Secondary | ICD-10-CM | POA: Diagnosis present

## 2022-09-26 DIAGNOSIS — I1310 Hypertensive heart and chronic kidney disease without heart failure, with stage 1 through stage 4 chronic kidney disease, or unspecified chronic kidney disease: Secondary | ICD-10-CM | POA: Diagnosis not present

## 2022-09-26 DIAGNOSIS — K449 Diaphragmatic hernia without obstruction or gangrene: Secondary | ICD-10-CM | POA: Diagnosis not present

## 2022-09-26 DIAGNOSIS — L89322 Pressure ulcer of left buttock, stage 2: Secondary | ICD-10-CM | POA: Diagnosis not present

## 2022-09-26 DIAGNOSIS — S40011A Contusion of right shoulder, initial encounter: Secondary | ICD-10-CM | POA: Diagnosis present

## 2022-09-26 DIAGNOSIS — Z981 Arthrodesis status: Secondary | ICD-10-CM

## 2022-09-26 DIAGNOSIS — S42001D Fracture of unspecified part of right clavicle, subsequent encounter for fracture with routine healing: Secondary | ICD-10-CM | POA: Diagnosis not present

## 2022-09-26 DIAGNOSIS — Z833 Family history of diabetes mellitus: Secondary | ICD-10-CM

## 2022-09-26 DIAGNOSIS — Z7189 Other specified counseling: Secondary | ICD-10-CM | POA: Diagnosis not present

## 2022-09-26 DIAGNOSIS — E871 Hypo-osmolality and hyponatremia: Secondary | ICD-10-CM | POA: Diagnosis present

## 2022-09-26 DIAGNOSIS — L89154 Pressure ulcer of sacral region, stage 4: Secondary | ICD-10-CM | POA: Diagnosis not present

## 2022-09-26 DIAGNOSIS — E860 Dehydration: Secondary | ICD-10-CM | POA: Diagnosis present

## 2022-09-26 DIAGNOSIS — E440 Moderate protein-calorie malnutrition: Secondary | ICD-10-CM | POA: Diagnosis present

## 2022-09-26 DIAGNOSIS — Z882 Allergy status to sulfonamides status: Secondary | ICD-10-CM

## 2022-09-26 DIAGNOSIS — I5032 Chronic diastolic (congestive) heart failure: Secondary | ICD-10-CM | POA: Diagnosis not present

## 2022-09-26 DIAGNOSIS — Z79899 Other long term (current) drug therapy: Secondary | ICD-10-CM

## 2022-09-26 DIAGNOSIS — E161 Other hypoglycemia: Secondary | ICD-10-CM | POA: Diagnosis not present

## 2022-09-26 DIAGNOSIS — D649 Anemia, unspecified: Secondary | ICD-10-CM | POA: Diagnosis not present

## 2022-09-26 LAB — COMPREHENSIVE METABOLIC PANEL
ALT: 14 U/L (ref 0–44)
AST: 21 U/L (ref 15–41)
Albumin: 2.3 g/dL — ABNORMAL LOW (ref 3.5–5.0)
Alkaline Phosphatase: 138 U/L — ABNORMAL HIGH (ref 38–126)
Anion gap: 8 (ref 5–15)
BUN: 25 mg/dL — ABNORMAL HIGH (ref 8–23)
CO2: 30 mmol/L (ref 22–32)
Calcium: 7.9 mg/dL — ABNORMAL LOW (ref 8.9–10.3)
Chloride: 96 mmol/L — ABNORMAL LOW (ref 98–111)
Creatinine, Ser: 1.35 mg/dL — ABNORMAL HIGH (ref 0.44–1.00)
GFR, Estimated: 41 mL/min — ABNORMAL LOW (ref >60)
Glucose, Bld: 147 mg/dL — ABNORMAL HIGH (ref 70–99)
Potassium: 4 mmol/L (ref 3.5–5.1)
Sodium: 134 mmol/L — ABNORMAL LOW (ref 135–145)
Total Bilirubin: 0.6 mg/dL (ref 0.3–1.2)
Total Protein: 5.3 g/dL — ABNORMAL LOW (ref 6.5–8.1)

## 2022-09-26 LAB — CBG MONITORING, ED
Glucose-Capillary: 146 mg/dL — ABNORMAL HIGH (ref 70–99)
Glucose-Capillary: 43 mg/dL — CL (ref 70–99)
Glucose-Capillary: 110 mg/dL — ABNORMAL HIGH (ref 70–99)
Glucose-Capillary: 123 mg/dL — ABNORMAL HIGH (ref 70–99)
Glucose-Capillary: 85 mg/dL (ref 70–99)

## 2022-09-26 LAB — CBC WITH DIFFERENTIAL/PLATELET
Abs Immature Granulocytes: 0.05 10*3/uL (ref 0.00–0.07)
Basophils Absolute: 0 10*3/uL (ref 0.0–0.1)
Basophils Relative: 0 %
Eosinophils Absolute: 0.1 10*3/uL (ref 0.0–0.5)
Eosinophils Relative: 1 %
HCT: 30.5 % — ABNORMAL LOW (ref 36.0–46.0)
Hemoglobin: 9.4 g/dL — ABNORMAL LOW (ref 12.0–15.0)
Immature Granulocytes: 1 %
Lymphocytes Relative: 9 %
Lymphs Abs: 0.9 10*3/uL (ref 0.7–4.0)
MCH: 30.5 pg (ref 26.0–34.0)
MCHC: 30.8 g/dL (ref 30.0–36.0)
MCV: 99 fL (ref 80.0–100.0)
Monocytes Absolute: 0.5 10*3/uL (ref 0.1–1.0)
Monocytes Relative: 5 %
Neutro Abs: 7.8 10*3/uL — ABNORMAL HIGH (ref 1.7–7.7)
Neutrophils Relative %: 84 %
Platelets: 254 10*3/uL (ref 150–400)
RBC: 3.08 MIL/uL — ABNORMAL LOW (ref 3.87–5.11)
RDW: 12.4 % (ref 11.5–15.5)
WBC: 9.2 10*3/uL (ref 4.0–10.5)
nRBC: 0 % (ref 0.0–0.2)

## 2022-09-26 LAB — GLUCOSE, CAPILLARY
Glucose-Capillary: 87 mg/dL (ref 70–99)
Glucose-Capillary: 111 mg/dL — ABNORMAL HIGH (ref 70–99)
Glucose-Capillary: 199 mg/dL — ABNORMAL HIGH (ref 70–99)

## 2022-09-26 LAB — CK: Total CK: 44 U/L (ref 38–234)

## 2022-09-26 LAB — MRSA NEXT GEN BY PCR, NASAL: MRSA by PCR Next Gen: DETECTED — AB

## 2022-09-26 MED ORDER — CITALOPRAM HYDROBROMIDE 20 MG PO TABS
40.0000 mg | ORAL_TABLET | Freq: Every day | ORAL | Status: DC
  Administered 2022-09-27 – 2022-10-01 (×5): 40 mg via ORAL
  Filled 2022-09-26 (×5): qty 2

## 2022-09-26 MED ORDER — SODIUM CHLORIDE 0.9 % IV SOLN: INTRAVENOUS | Status: DC | PRN

## 2022-09-26 MED ORDER — SODIUM CHLORIDE 0.9% FLUSH
3.0000 mL | Freq: Two times a day (BID) | INTRAVENOUS | Status: DC
  Administered 2022-09-28 – 2022-10-01 (×2): 3 mL via INTRAVENOUS

## 2022-09-26 MED ORDER — POLYETHYLENE GLYCOL 3350 17 G PO PACK: 17.0000 g | PACK | Freq: Every day | ORAL | Status: DC | PRN

## 2022-09-26 MED ORDER — VANCOMYCIN HCL 125 MG PO CAPS
125.0000 mg | ORAL_CAPSULE | Freq: Four times a day (QID) | ORAL | Status: DC
  Administered 2022-09-26 – 2022-10-01 (×16): 125 mg via ORAL
  Filled 2022-09-26 (×12): qty 1

## 2022-09-26 MED ORDER — ENSURE ENLIVE PO LIQD
237.0000 mL | Freq: Three times a day (TID) | ORAL | Status: DC
  Administered 2022-09-26 – 2022-09-30 (×13): 237 mL via ORAL

## 2022-09-26 MED ORDER — ACETAMINOPHEN 650 MG RE SUPP: 650.0000 mg | Freq: Four times a day (QID) | RECTAL | Status: DC | PRN

## 2022-09-26 MED ORDER — SODIUM BICARBONATE 650 MG PO TABS
650.0000 mg | ORAL_TABLET | Freq: Three times a day (TID) | ORAL | Status: DC
  Administered 2022-09-27 – 2022-10-01 (×14): 650 mg via ORAL
  Filled 2022-09-26 (×14): qty 1

## 2022-09-26 MED ORDER — LOPERAMIDE HCL 2 MG PO CAPS: 2.0000 mg | ORAL_CAPSULE | Freq: Four times a day (QID) | ORAL | Status: DC | PRN

## 2022-09-26 MED ORDER — LOPERAMIDE HCL 2 MG PO CAPS: 2.0000 mg | ORAL_CAPSULE | Freq: Two times a day (BID) | ORAL | Status: DC

## 2022-09-26 MED ORDER — ONDANSETRON HCL 4 MG PO TABS: 4.0000 mg | ORAL_TABLET | Freq: Four times a day (QID) | ORAL | Status: DC | PRN

## 2022-09-26 MED ORDER — MAGNESIUM OXIDE -MG SUPPLEMENT 400 (240 MG) MG PO TABS: 400.0000 mg | ORAL_TABLET | Freq: Every day | ORAL | Status: DC

## 2022-09-26 MED ORDER — LEVOTHYROXINE SODIUM 75 MCG PO TABS
75.0000 ug | ORAL_TABLET | Freq: Every morning | ORAL | Status: DC
  Administered 2022-09-27 – 2022-10-01 (×4): 75 ug via ORAL
  Filled 2022-09-26 (×5): qty 1

## 2022-09-26 MED ORDER — NYSTATIN 100000 UNIT/GM EX POWD
Freq: Three times a day (TID) | CUTANEOUS | Status: DC
  Filled 2022-09-26 (×2): qty 15

## 2022-09-26 MED ORDER — SODIUM CHLORIDE 0.9% FLUSH: 3.0000 mL | INTRAVENOUS | Status: DC | PRN

## 2022-09-26 MED ORDER — SODIUM CHLORIDE 0.9% FLUSH
3.0000 mL | Freq: Two times a day (BID) | INTRAVENOUS | Status: DC
  Administered 2022-09-26 – 2022-10-01 (×7): 3 mL via INTRAVENOUS

## 2022-09-26 MED ORDER — BISACODYL 10 MG RE SUPP: 10.0000 mg | Freq: Every day | RECTAL | Status: DC | PRN

## 2022-09-26 MED ORDER — CHLORHEXIDINE GLUCONATE CLOTH 2 % EX PADS
6.0000 | MEDICATED_PAD | Freq: Every day | CUTANEOUS | Status: AC
  Administered 2022-09-27 – 2022-10-01 (×5): 6 via TOPICAL

## 2022-09-26 MED ORDER — TRAZODONE HCL 50 MG PO TABS
50.0000 mg | ORAL_TABLET | Freq: Every evening | ORAL | Status: DC | PRN
  Administered 2022-09-29 – 2022-09-30 (×2): 50 mg via ORAL
  Filled 2022-09-26 (×3): qty 1

## 2022-09-26 MED ORDER — DEXTROSE 50 % IV SOLN
INTRAVENOUS | Status: AC
  Administered 2022-09-26: 50 mL via INTRAVENOUS
  Filled 2022-09-26: qty 50

## 2022-09-26 MED ORDER — MEDIHONEY WOUND/BURN DRESSING EX PSTE
1.0000 | PASTE | Freq: Every day | CUTANEOUS | Status: DC
  Administered 2022-09-30 – 2022-10-01 (×2): 1 via TOPICAL
  Filled 2022-09-26 (×2): qty 44

## 2022-09-26 MED ORDER — HEPARIN SODIUM (PORCINE) 5000 UNIT/ML IJ SOLN
5000.0000 [IU] | Freq: Three times a day (TID) | INTRAMUSCULAR | Status: DC
  Administered 2022-09-26 – 2022-10-01 (×13): 5000 [IU] via SUBCUTANEOUS
  Filled 2022-09-26 (×14): qty 1

## 2022-09-26 MED ORDER — ONDANSETRON HCL 4 MG/2ML IJ SOLN: 4.0000 mg | Freq: Four times a day (QID) | INTRAMUSCULAR | Status: DC | PRN

## 2022-09-26 MED ORDER — ROPINIROLE HCL 1 MG PO TABS
3.0000 mg | ORAL_TABLET | Freq: Every day | ORAL | Status: DC
  Administered 2022-09-26 – 2022-09-30 (×5): 3 mg via ORAL
  Filled 2022-09-26 (×6): qty 3

## 2022-09-26 MED ORDER — MUPIROCIN 2 % EX OINT
1.0000 | TOPICAL_OINTMENT | Freq: Two times a day (BID) | CUTANEOUS | Status: AC
  Administered 2022-09-27 – 2022-10-01 (×10): 1 via NASAL
  Filled 2022-09-26 (×4): qty 22

## 2022-09-26 MED ORDER — DEXTROSE-NACL 5-0.9 % IV SOLN: INTRAVENOUS | Status: DC

## 2022-09-26 MED ORDER — DEXTROSE 50 % IV SOLN: 1.0000 | Freq: Once | INTRAVENOUS | Status: AC

## 2022-09-26 MED ORDER — PANCRELIPASE (LIP-PROT-AMYL) 12000-38000 UNITS PO CPEP: 24000.0000 [IU] | ORAL_CAPSULE | Freq: Three times a day (TID) | ORAL | Status: DC

## 2022-09-26 MED ORDER — DEXTROSE 10 % IV SOLN: Freq: Once | INTRAVENOUS | Status: AC

## 2022-09-26 MED ORDER — CHOLESTYRAMINE 4 G PO PACK: 4.0000 g | PACK | Freq: Three times a day (TID) | ORAL | Status: DC

## 2022-09-26 MED ORDER — ACETAMINOPHEN 325 MG PO TABS
650.0000 mg | ORAL_TABLET | Freq: Four times a day (QID) | ORAL | Status: DC | PRN
  Administered 2022-09-27 – 2022-09-30 (×3): 650 mg via ORAL
  Filled 2022-09-26 (×3): qty 2

## 2022-09-26 NOTE — ED Notes (Signed)
Unable to get blood return on R PICC line; However, the line does flush. Obtained IV access in L arm and will give any ordered medications through IV line in L arm

## 2022-09-26 NOTE — ED Notes (Signed)
MD made aware of cbg of 43, amp of D50 given

## 2022-09-26 NOTE — H&P (Signed)
Patient Demographics:    [name], is a 77 y.o. female  MRN: [id number]   DOB - [birth year]  Admit Date - 09/26/2022  Outpatient Primary MD for the patient is [name], FNP   Assessment & Plan:   Assessment and Plan:  1) recurrent hypoglycemia--multifactorial in the setting of poor oral intake and diarrhea  -No emesis -patient has chronic diarrhea -Also had poor appetite -Due to right upper extremity fracture she has been unable to feed himself----resulting in episodes of low sugars -Despite replacements in the ED by EMS and at SNF facility hypoglycemia persisted -Continue IV dextrose solution -Encourage better oral intake -Check proinsulin/insulin  2)Acute metabolic encephalopathy--- secondary to #1 above -Should improve with improvement in glycemic numbers  3) hyponatremia----most likely due to dehydration and poor oral intake -IV fluids as above  4)Sacral decubitus ulcer--POA --Left heel pressure injury noted -Large left buttock decubitus ulcer deep to the bone- please see photos in epic see photos in epic -Wound care consult requested  5)Hypothyroidism: Continue Synthyroid   6)RLS: Continue Requip   7)Moderate protein calorie moderation: Albumin of 2.6.  Nutritionist was consulted and following   8)CKD stage IIIb:- renally adjust medications, avoid nephrotoxic agents / dehydration  / hypotension  9)Social/Ethics--plan of care and advanced directive discussed with patient and his brother when further at bedside -Patient is a DNR/DNI without limitations to treatment otherwise   Dispo: The patient is from: Home              Anticipated d/c is to: Home              Anticipated d/c date is: 1 day              Patient currently is not medically stable to d/c. Barriers: Not  Clinically Stable-    With History of - Reviewed by me  Past Medical History:  Diagnosis Date   Allergy    Bradycardia    Bronchitis, chronic (HCC)    Chronic anxiety    Complication of anesthesia    hard to wake up   COPD (chronic obstructive pulmonary disease) (HCC)    Depression    Esophageal reflux    Headache(784.0)    Hyperlipidemia    Hypertension    Hypothyroidism    Obesity    Osteoporosis    Overactive bladder    PVC's (premature ventricular contractions)    Thyroid disease    Vitamin D deficiency disease       Past Surgical History:  Procedure Laterality Date   ABDOMINAL HYSTERECTOMY     CENTRAL LINE INSERTION Right 01/10/2022   Procedure: CENTRAL LINE INSERTION;  Surgeon: Lucretia Roers, MD;  Location: [facility];  Service: General;  Laterality: Right;   CHOLECYSTECTOMY N/A 11/04/2013   Procedure: LAPAROSCOPIC CHOLECYSTECTOMY WITH INTRAOPERATIVE CHOLANGIOGRAM;  Surgeon: Wilmon Arms. Corliss Skains, MD;  Location: [facility] OR;  Service: General;  Laterality: N/A;   COLONOSCOPY  FRACTURE SURGERY     pins removed from Hip surgery   HIP FRACTURE SURGERY  1990    pins removed in 1991   IR FLUORO GUIDE CV LINE LEFT  01/31/2022   IR US GUIDE VASC ACCESS LEFT  01/31/2022   LAPAROTOMY N/A 01/10/2022   Procedure: EXPLORATORY LAPAROTOMY,;  Surgeon: Lucretia Roers, MD;  Location: [facility];  Service: General;  Laterality: N/A;   LYSIS OF ADHESION N/A 01/10/2022   Procedure: LYSIS OF ADHESION;  Surgeon: Lucretia Roers, MD;  Location: [facility];  Service: General;  Laterality: N/A;   PARTIAL HYSTERECTOMY  1987   SPINAL FUSION     THORACENTESIS N/A 01/18/2022   Procedure: THORACENTESIS;  Surgeon: Lorin Glass, MD;  Location: [facility] ENDOSCOPY;  Service: Pulmonary;  Laterality: N/A;   UPPER GASTROINTESTINAL ENDOSCOPY     Chief Complaint  Patient presents with   Hypoglycemia      HPI:    [name]  is a 77 y.o. female with medical history significant of  hypothyroidism, GERD, RLS,  CKD 3B presenting from [facility] SNF facility with episodes of recurrent hypoglycemia resulting in altered mentation -Additional history obtained from patient's brother [name] at bedside -Patient has chronic diarrhea -Also had poor appetite -Due to right upper extremity fracture she has been unable to feed himself----resulting in episodes of low sugars -Despite replacements in the ED by EMS and at SNF facility hypoglycemia persisted so she was started on continuous dextrose fluids was requested No fever  Or chills , -No vomiting -CT head without acute findings -Creatinine is 1.25 which is close to patient's baseline -Glucose was down to 43 in the ED despite replacement earlier by EMS when it was 55 at facility -Sodium is 134 -Hgb 9.4, WBC 9.2   Review of systems:    In addition to the HPI above,   A full Review of  Systems was done, all other systems reviewed are negative except as noted above in HPI , .   Social History:  Reviewed by me    Social History   Tobacco Use   Smoking status: Never   Smokeless tobacco: Never  Substance Use Topics   Alcohol use: No     Family History :  Reviewed by me    Family History  Problem Relation Age of Onset   Arthritis Other    Heart disease Other    Cancer Other    Diabetes Other    OCD Other      Home Medications:   Prior to Admission medications   Medication Sig Start Date End Date Taking? Authorizing Provider  cholestyramine (QUESTRAN) 4 g packet Take 1 packet (4 g total) by mouth 3 (three) times daily with meals. 05/30/22   Daphine Deutscher, Mary-Margaret, FNP  citalopram (CELEXA) 40 MG tablet Take 1 tablet (40 mg total) by mouth daily. 05/06/22   Daphine Deutscher, Mary-Margaret, FNP  dicyclomine (BENTYL) 10 MG capsule Take 1 capsule (10 mg total) by mouth every 8 (eight) hours as needed for spasms. TAKE ONE CAPSULE BY MOUTH THREE TIMES DAILY BEFORE meals 09/04/22   Burnadette Pop, MD  leptospermum manuka honey (MEDIHONEY) PSTE paste Apply 1  Application topically daily. 09/05/22   Burnadette Pop, MD  levothyroxine (SYNTHROID) 75 MCG tablet Take 1 tablet (75 mcg total) by mouth daily. 05/07/22   Daphine Deutscher, Mary-Margaret, FNP  loperamide (IMODIUM) 2 MG capsule Take 1 capsule (2 mg total) by mouth every 6 (six) hours as needed for diarrhea or loose stools.  09/04/22   Burnadette Pop, MD  Magnesium Oxide 400 MG CAPS Take 1 capsule (400 mg total) by mouth daily. 09/04/22   Burnadette Pop, MD  nystatin (MYCOSTATIN/NYSTOP) powder Apply topically 3 (three) times daily. On groins 09/04/22   Burnadette Pop, MD  potassium chloride (KLOR-CON) 20 MEQ packet Take 40 mEq by mouth daily for 7 days. 09/05/22 09/12/22  Burnadette Pop, MD  potassium chloride (MICRO-K) 10 MEQ CR capsule Take 1 capsule (10 mEq total) by mouth 2 (two) times daily. 09/12/22   Burnadette Pop, MD  rOPINIRole (REQUIP) 3 MG tablet Take 1 tablet (3 mg total) by mouth at bedtime. 05/06/22   Daphine Deutscher, Mary-Margaret, FNP  sodium bicarbonate 650 MG tablet Take 1 tablet (650 mg total) by mouth 3 (three) times daily. 09/04/22 10/04/22  Burnadette Pop, MD     Allergies:     Allergies  Allergen Reactions   Celebrex [Celecoxib] Swelling   Keflet [Cephalexin] Swelling   Penicillins Hives and Swelling    DID THE REACTION INVOLVE: Swelling of the face/tongue/throat, SOB, or low BP? Yes-swelling-hives Sudden or severe rash/hives, skin peeling, or the inside of the mouth or nose? Unknown Did it require medical treatment? Unknown When did it last happen?    over 10 years   If all above answers are "NO", may proceed with cephalosporin use.    Sulfa Antibiotics Swelling   Kenalog [Triamcinolone Acetonide]     unknown   Latex    Lisinopril Other (See Comments)    unknown   Mobic [Meloxicam]     SWELLING   Penicillin G Other (See Comments)     Physical Exam:   Vitals  Blood pressure 93/81, pulse 65, temperature 98 F (36.7 C), temperature source Oral, resp. rate 14, height 5\' 2"   (1.575 m), weight 67.6 kg, SpO2 97 %.  Physical Examination: General appearance - alert, frail and elderly appearing  mental status - alert, oriented to person, place, and time,  Eyes - sclera anicteric Neck - supple, no JVD elevation , Chest - clear  to auscultation bilaterally, symmetrical air movement,  Heart - S1 and S2 normal, regular  Abdomen - soft, nontender, nondistended, +BS Neurological - screening mental status exam normal, neck supple without rigidity, cranial nerves II through XII intact, DTR's normal and symmetric Extremities - no pedal edema noted, intact peripheral pulses  MSK-ecchymosis and bruising over right shoulder area Limited range of motion of right upper extremity -Left heel pressure injury noted Skin - Large left buttock decubitus ulcer deep to the bone- please see photos in epic -Inframammary area with erythematous rash with satellite lesions consistent with Candida infection -  Media Information  Document Information  Photos  Lt Buttocks  09/26/2022 16:42  Attached To:  Hospital Encounter on 09/26/22  Source Information  Shon Hale, MD  [facility]   -   Media Information  Document Information  Photos  Left buttocks  09/26/2022 16:41  Attached To:  Hospital Encounter on 09/26/22  Source Information  Shon Hale, MD  [facility]     Media Information  Document Information  Photos  Lt Heel  09/26/2022 16:43  Attached To:  Hospital Encounter on 09/26/22  Source Information  Shon Hale, MD  [facility]     Data Review:    CBC Recent Labs  Lab 09/24/22 0017 09/26/22 1033  WBC 9.4 9.2  HGB 10.3* 9.4*  HCT 32.6* 30.5*  PLT 316 254  MCV 97.9 99.0  MCH 30.9 30.5  MCHC 31.6  30.8  RDW 12.5 12.4  LYMPHSABS  --  0.9  MONOABS  --  0.5  EOSABS  --  0.1  BASOSABS  --  0.0   ------------------------------------------------------------------------------------------------------------------  Chemistries  Recent  Labs  Lab 09/24/22 0017 09/26/22 1033  NA 136 134*  K 4.1 4.0  CL 96* 96*  CO2 32 30  GLUCOSE 101* 147*  BUN 21 25*  CREATININE 1.42* 1.35*  CALCIUM 8.1* 7.9*  AST 23 21  ALT 14 14  ALKPHOS 175* 138*  BILITOT 0.7 0.6   ------------------------------------------------------------------------------------------------------------------ estimated creatinine clearance is 32 mL/min (A) (by C-G formula based on SCr of 1.35 mg/dL (H)). ------------------------------------------------------------------------------------------------------------------ ------------------------------------------------------------------------------------------------------------------    Component Value Date/Time   BNP 264.0 (H) 07/06/2022 1850   Urinalysis    Component Value Date/Time   COLORURINE YELLOW 08/31/2022 0102   APPEARANCEUR CLEAR 08/31/2022 0102   APPEARANCEUR Clear 03/01/2021 1257   LABSPEC 1.012 08/31/2022 0102   PHURINE 5.0 08/31/2022 0102   GLUCOSEU NEGATIVE 08/31/2022 0102   HGBUR NEGATIVE 08/31/2022 0102   BILIRUBINUR NEGATIVE 08/31/2022 0102   BILIRUBINUR Negative 03/01/2021 1257   KETONESUR NEGATIVE 08/31/2022 0102   PROTEINUR NEGATIVE 08/31/2022 0102   NITRITE NEGATIVE 08/31/2022 0102   LEUKOCYTESUR TRACE (A) 08/31/2022 0102    Imaging Results:    CT Head Wo Contrast  Result Date: 09/26/2022 CLINICAL DATA:  Delirium.  Altered mental status. EXAM: CT HEAD WITHOUT CONTRAST TECHNIQUE: Contiguous axial images were obtained from the base of the skull through the vertex without intravenous contrast. RADIATION DOSE REDUCTION: This exam was performed according to the departmental dose-optimization program which includes automated exposure control, adjustment of the mA and/or kV according to patient size and/or use of iterative reconstruction technique. COMPARISON:  Head CT 09/24/2022. FINDINGS: Brain: No acute hemorrhage. Unchanged mild chronic small-vessel disease. Cortical gray-white  differentiation is otherwise preserved. No hydrocephalus or extra-axial collection. No mass effect or midline shift. Vascular: No hyperdense vessel or unexpected calcification. Skull: No calvarial fracture or suspicious bone lesion. Skull base is unremarkable. Sinuses/Orbits: Mucous retention cysts in the left maxillary and left inferior frontal sinus. Orbits are unremarkable. Other: None. IMPRESSION: 1. No acute intracranial abnormality. 2. Unchanged mild chronic small-vessel disease. Electronically Signed   By: Orvan Falconer M.D.   On: 09/26/2022 12:15    Radiological Exams on Admission: CT Head Wo Contrast  Result Date: 09/26/2022 CLINICAL DATA:  Delirium.  Altered mental status. EXAM: CT HEAD WITHOUT CONTRAST TECHNIQUE: Contiguous axial images were obtained from the base of the skull through the vertex without intravenous contrast. RADIATION DOSE REDUCTION: This exam was performed according to the departmental dose-optimization program which includes automated exposure control, adjustment of the mA and/or kV according to patient size and/or use of iterative reconstruction technique. COMPARISON:  Head CT 09/24/2022. FINDINGS: Brain: No acute hemorrhage. Unchanged mild chronic small-vessel disease. Cortical gray-white differentiation is otherwise preserved. No hydrocephalus or extra-axial collection. No mass effect or midline shift. Vascular: No hyperdense vessel or unexpected calcification. Skull: No calvarial fracture or suspicious bone lesion. Skull base is unremarkable. Sinuses/Orbits: Mucous retention cysts in the left maxillary and left inferior frontal sinus. Orbits are unremarkable. Other: None. IMPRESSION: 1. No acute intracranial abnormality. 2. Unchanged mild chronic small-vessel disease. Electronically Signed   By: Orvan Falconer M.D.   On: 09/26/2022 12:15    DVT Prophylaxis -SCD/heparin  AM Labs Ordered, also please review Full Orders  Family Communication: Admission, patients condition  and plan of care including tests being ordered have been discussed  with the patient and brother [name] who indicate understanding and agree with the plan   Condition  -stable  Shon Hale M.D on 09/26/2022 at 7:01 PM Go to [email or web address] -  for contact info  [facility] - Office  [phone]

## 2022-09-26 NOTE — ED Notes (Signed)
Pt arrived with a PICC line on R arm in place, pt was here 2 days ago for R arm fracture. Large areas of bruising noted on R shoulder, bruising noted throughout body

## 2022-09-26 NOTE — ED Notes (Signed)
Cbg 10-15 minutes after amp of D50 was 146--MD made aware

## 2022-09-26 NOTE — ED Triage Notes (Signed)
Pt arrive via [facility] from [facility] c/o hypoglycemia. Per facility,cbg was 50 and pt was given 2 of glucagon and cbg went up to 62. EMS cbg was 95. Cbg here 79 MD made aware

## 2022-09-26 NOTE — ED Notes (Signed)
Cbg 110

## 2022-09-26 NOTE — ED Notes (Signed)
EKG given to MD, MD made aware of HR of high 40s-50s and cbg of 123

## 2022-09-26 NOTE — ED Notes (Signed)
When pt arrived back to room from CT, pt appeared more alert as she had eyes open and was talking

## 2022-09-26 NOTE — ED Provider Notes (Signed)
[facility] MEDICAL SURGICAL UNIT Provider Note  CSN: [id number] Arrival date & time: 09/26/22 4098  Chief Complaint(s) Hypoglycemia  HPI [name] is a 77 y.o. female with PMH hypothyroidism, GERD, restless leg syndrome, CKD 3, recent hospital discharge on 09/04/2022 for acute metabolic encephalopathy and rhabdomyolysis with a fall who presents emergency department for evaluation of altered mental status and hypoglycemia.  Patient arrives from [facility] where she was found to be altered with a sugar of 50.  She was given 2 rounds of intramuscular glucagon and recheck was 62.  Here in the emergency department, patient arrives with a glucose of 43 and is altered unable to provide further history.  Strangely, patient had an almost identical presentation on 09/24/2022 but ultimately improved after sugars improved.   Past Medical History Past Medical History:  Diagnosis Date   Allergy    Bradycardia    Bronchitis, chronic (HCC)    Chronic anxiety    Complication of anesthesia    hard to wake up   COPD (chronic obstructive pulmonary disease) (HCC)    Depression    Esophageal reflux    Headache(784.0)    Hyperlipidemia    Hypertension    Hypothyroidism    Obesity    Osteoporosis    Overactive bladder    PVC's (premature ventricular contractions)    Thyroid disease    Vitamin D deficiency disease    Patient Active Problem List   Diagnosis Date Noted   Hypoglycemia 09/26/2022   Frequent falls 08/30/2022   Fall at home, initial encounter 08/29/2022   Low back pain 08/29/2022   Failure to thrive in adult 08/29/2022   Physical deconditioning 08/29/2022   Elevated CK 08/29/2022   Decubitus ulcer 08/29/2022   GERD (gastroesophageal reflux disease) 08/29/2022   Restless leg syndrome 08/29/2022   Rhabdomyolysis 07/07/2022   Dehydration 07/07/2022   Transaminitis 07/07/2022   Hypotension 07/07/2022   SIRS (systemic inflammatory response syndrome) (HCC) 07/07/2022   Hypokalemia  07/07/2022   Stage 3b chronic kidney disease (CKD) (HCC) 07/07/2022   Hypothermia 07/07/2022   Acute metabolic encephalopathy 07/06/2022   BMI 26.0-26.9,adult 05/06/2022   Atrial fibrillation (HCC) 04/12/2022   Congenital heart failure (HCC) 04/12/2022   Malnutrition of moderate degree 01/14/2022   Respiratory failure requiring intubation (HCC) 01/11/2022    Class: Acute   Septic shock (HCC) 01/10/2022   Pressure injury of skin 01/10/2022   Small bowel obstruction (HCC) 01/07/2022   Abnormal weight loss 11/28/2021   Family history of malignant neoplasm of digestive organs 11/28/2021   Intestinal malabsorption 11/28/2021   Renal insufficiency 12/08/2020   Degeneration of lumbar intervertebral disc 01/07/2020   Venous (peripheral) insufficiency 09/30/2019   Anemia 08/12/2017   Vitamin D deficiency 08/12/2017   Lumbar spondylosis with myelopathy 06/03/2017   Primary osteoarthritis of both knees 03/19/2017   Anxiety 02/17/2017   History of pulmonary embolus (PE) 07/09/2016   COPD (chronic obstructive pulmonary disease) (HCC) 01/18/2016   Mixed hyperlipidemia 01/18/2016   Depression 01/18/2016   Essential hypertension 01/18/2016   H/O spinal fusion 01/18/2016   Acquired hypothyroidism 01/18/2016   Thoracic aorta atherosclerosis (HCC) 01/18/2016   Chronic cholecystitis with calculus 10/04/2013   Home Medication(s) Prior to Admission medications   Medication Sig Start Date End Date Taking? Authorizing Provider  cholestyramine (QUESTRAN) 4 g packet Take 1 packet (4 g total) by mouth 3 (three) times daily with meals. 05/30/22   Daphine Deutscher Mary-Margaret, FNP  citalopram (CELEXA) 40 MG tablet Take 1 tablet (40 mg  total) by mouth daily. 05/06/22   Daphine Deutscher, Mary-Margaret, FNP  dicyclomine (BENTYL) 10 MG capsule Take 1 capsule (10 mg total) by mouth every 8 (eight) hours as needed for spasms. TAKE ONE CAPSULE BY MOUTH THREE TIMES DAILY BEFORE meals 09/04/22   Burnadette Pop, MD  leptospermum  manuka honey (MEDIHONEY) PSTE paste Apply 1 Application topically daily. 09/05/22   Burnadette Pop, MD  levothyroxine (SYNTHROID) 75 MCG tablet Take 1 tablet (75 mcg total) by mouth daily. 05/07/22   Daphine Deutscher, Mary-Margaret, FNP  loperamide (IMODIUM) 2 MG capsule Take 1 capsule (2 mg total) by mouth every 6 (six) hours as needed for diarrhea or loose stools. 09/04/22   Burnadette Pop, MD  Magnesium Oxide 400 MG CAPS Take 1 capsule (400 mg total) by mouth daily. 09/04/22   Burnadette Pop, MD  nystatin (MYCOSTATIN/NYSTOP) powder Apply topically 3 (three) times daily. On groins 09/04/22   Burnadette Pop, MD  potassium chloride (KLOR-CON) 20 MEQ packet Take 40 mEq by mouth daily for 7 days. 09/05/22 09/12/22  Burnadette Pop, MD  potassium chloride (MICRO-K) 10 MEQ CR capsule Take 1 capsule (10 mEq total) by mouth 2 (two) times daily. 09/12/22   Burnadette Pop, MD  rOPINIRole (REQUIP) 3 MG tablet Take 1 tablet (3 mg total) by mouth at bedtime. 05/06/22   Daphine Deutscher, Mary-Margaret, FNP  sodium bicarbonate 650 MG tablet Take 1 tablet (650 mg total) by mouth 3 (three) times daily. 09/04/22 10/04/22  Burnadette Pop, MD                                                                                                                                    Past Surgical History Past Surgical History:  Procedure Laterality Date   ABDOMINAL HYSTERECTOMY     CENTRAL LINE INSERTION Right 01/10/2022   Procedure: CENTRAL LINE INSERTION;  Surgeon: Lucretia Roers, MD;  Location: [facility]S;  Service: General;  Laterality: Right;   CHOLECYSTECTOMY N/A 11/04/2013   Procedure: LAPAROSCOPIC CHOLECYSTECTOMY WITH INTRAOPERATIVE CHOLANGIOGRAM;  Surgeon: Wilmon Arms. Corliss Skains, MD;  Location: [facility] OR;  Service: General;  Laterality: N/A;   COLONOSCOPY     FRACTURE SURGERY     pins removed from Hip surgery   HIP FRACTURE SURGERY  1990    pins removed in 1991   IR FLUORO GUIDE CV LINE LEFT  01/31/2022   IR US GUIDE VASC ACCESS LEFT  01/31/2022    LAPAROTOMY N/A 01/10/2022   Procedure: EXPLORATORY LAPAROTOMY,;  Surgeon: Lucretia Roers, MD;  Location: [facility];  Service: General;  Laterality: N/A;   LYSIS OF ADHESION N/A 01/10/2022   Procedure: LYSIS OF ADHESION;  Surgeon: Lucretia Roers, MD;  Location: [facility];  Service: General;  Laterality: N/A;   PARTIAL HYSTERECTOMY  1987   SPINAL FUSION     THORACENTESIS N/A 01/18/2022   Procedure: THORACENTESIS;  Surgeon: Lorin Glass, MD;  Location: [facility] ENDOSCOPY;  Service: Pulmonary;  Laterality: N/A;   UPPER GASTROINTESTINAL ENDOSCOPY     Family History Family History  Problem Relation Age of Onset   Arthritis Other    Heart disease Other    Cancer Other    Diabetes Other    OCD Other     Social History Social History   Tobacco Use   Smoking status: Never   Smokeless tobacco: Never  Vaping Use   Vaping Use: Never used  Substance Use Topics   Alcohol use: No   Drug use: No   Allergies Celebrex [celecoxib], Keflet [cephalexin], Penicillins, Sulfa antibiotics, Kenalog [triamcinolone acetonide], Latex, Lisinopril, Mobic [meloxicam], and Penicillin g  Review of Systems Review of Systems  Unable to perform ROS: Mental status change    Physical Exam Vital Signs  I have reviewed the triage vital signs BP 93/81 (BP Location: Left Arm)   Pulse 65   Temp 98 F (36.7 C) (Oral)   Resp 14   Ht 5\' 2"  (1.575 m)   Wt 67.6 kg   SpO2 97%   BMI 27.25 kg/m   Physical Exam Vitals and nursing note reviewed.  Constitutional:      General: She is not in acute distress.    Appearance: She is well-developed. She is ill-appearing.  HENT:     Head: Normocephalic and atraumatic.  Eyes:     Conjunctiva/sclera: Conjunctivae normal.  Cardiovascular:     Rate and Rhythm: Normal rate and regular rhythm.     Heart sounds: No murmur heard. Pulmonary:     Effort: Pulmonary effort is normal. No respiratory distress.     Breath sounds: Normal breath sounds.  Abdominal:      Palpations: Abdomen is soft.     Tenderness: There is no abdominal tenderness.  Musculoskeletal:        General: No swelling.     Cervical back: Neck supple.  Skin:    General: Skin is warm and dry.     Capillary Refill: Capillary refill takes less than 2 seconds.     Findings: Bruising present.  Neurological:     Mental Status: She is alert. She is disoriented.  Psychiatric:        Mood and Affect: Mood normal.     ED Results and Treatments Labs (all labs ordered are listed, but only abnormal results are displayed) Labs Reviewed  COMPREHENSIVE METABOLIC PANEL - Abnormal; Notable for the following components:      Result Value   Sodium 134 (*)    Chloride 96 (*)    Glucose, Bld 147 (*)    BUN 25 (*)    Creatinine, Ser 1.35 (*)    Calcium 7.9 (*)    Total Protein 5.3 (*)    Albumin 2.3 (*)    Alkaline Phosphatase 138 (*)    GFR, Estimated 41 (*)    All other components within normal limits  CBC WITH DIFFERENTIAL/PLATELET - Abnormal; Notable for the following components:   RBC 3.08 (*)    Hemoglobin 9.4 (*)    HCT 30.5 (*)    Neutro Abs 7.8 (*)    All other components within normal limits  GLUCOSE, CAPILLARY - Abnormal; Notable for the following components:   Glucose-Capillary 111 (*)    All other components within normal limits  CBG MONITORING, ED - Abnormal; Notable for the following components:   Glucose-Capillary 43 (*)    All other components within normal limits  CBG MONITORING, ED - Abnormal; Notable for the following components:  Glucose-Capillary 146 (*)    All other components within normal limits  CBG MONITORING, ED - Abnormal; Notable for the following components:   Glucose-Capillary 123 (*)    All other components within normal limits  CBG MONITORING, ED - Abnormal; Notable for the following components:   Glucose-Capillary 110 (*)    All other components within normal limits  MRSA NEXT GEN BY PCR, NASAL  CK  GLUCOSE, CAPILLARY  PROINSULIN/INSULIN  RATIO  CORTISOL-AM, BLOOD  RENAL FUNCTION PANEL  CBG MONITORING, ED                                                                                                                          Radiology CT Head Wo Contrast  Result Date: 09/26/2022 CLINICAL DATA:  Delirium.  Altered mental status. EXAM: CT HEAD WITHOUT CONTRAST TECHNIQUE: Contiguous axial images were obtained from the base of the skull through the vertex without intravenous contrast. RADIATION DOSE REDUCTION: This exam was performed according to the departmental dose-optimization program which includes automated exposure control, adjustment of the mA and/or kV according to patient size and/or use of iterative reconstruction technique. COMPARISON:  Head CT 09/24/2022. FINDINGS: Brain: No acute hemorrhage. Unchanged mild chronic small-vessel disease. Cortical gray-white differentiation is otherwise preserved. No hydrocephalus or extra-axial collection. No mass effect or midline shift. Vascular: No hyperdense vessel or unexpected calcification. Skull: No calvarial fracture or suspicious bone lesion. Skull base is unremarkable. Sinuses/Orbits: Mucous retention cysts in the left maxillary and left inferior frontal sinus. Orbits are unremarkable. Other: None. IMPRESSION: 1. No acute intracranial abnormality. 2. Unchanged mild chronic small-vessel disease. Electronically Signed   By: Orvan Falconer M.D.   On: 09/26/2022 12:15    Pertinent labs & imaging results that were available during my care of the patient were reviewed by me and considered in my medical decision making (see MDM for details).  Medications Ordered in ED Medications  dextrose 5 %-0.9 % sodium chloride infusion ( Intravenous New Bag/Given 09/26/22 1723)  cholestyramine (QUESTRAN) packet 4 g (has no administration in time range)  citalopram (CELEXA) tablet 40 mg (has no administration in time range)  levothyroxine (SYNTHROID) tablet 75 mcg (has no administration in time range)   magnesium oxide (MAG-OX) tablet 400 mg (has no administration in time range)  sodium bicarbonate tablet 650 mg (has no administration in time range)  rOPINIRole (REQUIP) tablet 3 mg (has no administration in time range)  leptospermum manuka honey (MEDIHONEY) paste 1 Application (has no administration in time range)  nystatin (MYCOSTATIN/NYSTOP) topical powder (has no administration in time range)  sodium chloride flush (NS) 0.9 % injection 3 mL (has no administration in time range)  sodium chloride flush (NS) 0.9 % injection 3 mL (has no administration in time range)  sodium chloride flush (NS) 0.9 % injection 3 mL (has no administration in time range)  0.9 %  sodium chloride infusion (has no administration in time range)  acetaminophen (TYLENOL) tablet 650 mg (has no administration  in time range)    Or  acetaminophen (TYLENOL) suppository 650 mg (has no administration in time range)  traZODone (DESYREL) tablet 50 mg (has no administration in time range)  polyethylene glycol (MIRALAX / GLYCOLAX) packet 17 g (has no administration in time range)  bisacodyl (DULCOLAX) suppository 10 mg (has no administration in time range)  ondansetron (ZOFRAN) tablet 4 mg (has no administration in time range)    Or  ondansetron (ZOFRAN) injection 4 mg (has no administration in time range)  heparin injection 5,000 Units (has no administration in time range)  lipase/protease/amylase (CREON) capsule 24,000 Units (has no administration in time range)  loperamide (IMODIUM) capsule 2 mg (has no administration in time range)  dextrose 50 % solution 50 mL (50 mLs Intravenous Given 09/26/22 1004)  dextrose 10 % infusion ( Intravenous New Bag/Given 09/26/22 1257)                                                                                                                                     Procedures .Critical Care  Performed by: Glendora Score, MD Authorized by: Glendora Score, MD   Critical care provider  statement:    Critical care time (minutes):  30   Critical care was necessary to treat or prevent imminent or life-threatening deterioration of the following conditions:  Endocrine crisis   Critical care was time spent personally by me on the following activities:  Development of treatment plan with patient or surrogate, discussions with consultants, evaluation of patient's response to treatment, examination of patient, ordering and review of laboratory studies, ordering and review of radiographic studies, ordering and performing treatments and interventions, pulse oximetry, re-evaluation of patient's condition and review of old charts   (including critical care time)  Medical Decision Making / ED Course   This patient presents to the ED for concern of altered mental status, hypoglycemia, this involves an extensive number of treatment options, and is a complaint that carries with it a high risk of complications and morbidity.  The differential diagnosis includes malnutrition, insulin overdose, insulinoma, adrenal insufficiency  MDM: Patient seen emergency room for evaluation of altered mental status and hypoglycemia.  Physical exam reveals an altered ill-appearing patient with bruising over the right shoulder from an old fall dry tacky mucous membranes.  Initial blood sugar is 43 and 1 amp of glucose given.  Sugar improved to 146 and additional laboratory evaluation showing hemoglobin of 9.4 with an MCV of 99, calcium 7.9, albumin 2.3.  However, despite improvement of glucose, patient's mental status did not improve and thus a CT head was obtained that was reassuringly negative.  Unfortunately, patient's glucose values continue to drop while being observed in the emergency department and with mental status not improving, patient placed on a D10 drip and will require hospital admission for hypoglycemia of unknown origin.  Suspect an element of malnutrition.  Patient then admitted   Additional history  obtained:  -  External records from outside source obtained and reviewed including: Chart review including previous notes, labs, imaging, consultation notes   Lab Tests: -I ordered, reviewed, and interpreted labs.   The pertinent results include:   Labs Reviewed  COMPREHENSIVE METABOLIC PANEL - Abnormal; Notable for the following components:      Result Value   Sodium 134 (*)    Chloride 96 (*)    Glucose, Bld 147 (*)    BUN 25 (*)    Creatinine, Ser 1.35 (*)    Calcium 7.9 (*)    Total Protein 5.3 (*)    Albumin 2.3 (*)    Alkaline Phosphatase 138 (*)    GFR, Estimated 41 (*)    All other components within normal limits  CBC WITH DIFFERENTIAL/PLATELET - Abnormal; Notable for the following components:   RBC 3.08 (*)    Hemoglobin 9.4 (*)    HCT 30.5 (*)    Neutro Abs 7.8 (*)    All other components within normal limits  GLUCOSE, CAPILLARY - Abnormal; Notable for the following components:   Glucose-Capillary 111 (*)    All other components within normal limits  CBG MONITORING, ED - Abnormal; Notable for the following components:   Glucose-Capillary 43 (*)    All other components within normal limits  CBG MONITORING, ED - Abnormal; Notable for the following components:   Glucose-Capillary 146 (*)    All other components within normal limits  CBG MONITORING, ED - Abnormal; Notable for the following components:   Glucose-Capillary 123 (*)    All other components within normal limits  CBG MONITORING, ED - Abnormal; Notable for the following components:   Glucose-Capillary 110 (*)    All other components within normal limits  MRSA NEXT GEN BY PCR, NASAL  CK  GLUCOSE, CAPILLARY  PROINSULIN/INSULIN RATIO  CORTISOL-AM, BLOOD  RENAL FUNCTION PANEL  CBG MONITORING, ED      EKG   EKG Interpretation  Date/Time:  Thursday Sep 26 2022 10:57:44 EDT Ventricular Rate:  48 PR Interval:  172 QRS Duration: 115 QT Interval:  498 QTC Calculation: 445 R Axis:   76 Text  Interpretation: Sinus bradycardia Confirmed by Shaneca Orne ([id number]) on 09/26/2022 6:28:36 PM         Imaging Studies ordered: I ordered imaging studies including CT head I independently visualized and interpreted imaging. I agree with the radiologist interpretation   Medicines ordered and prescription drug management: Meds ordered this encounter  Medications   dextrose 50 % solution 50 mL   dextrose 50 % solution    Ether Griffins, Deanna N: cabinet override   dextrose 10 % infusion   dextrose 5 %-0.9 % sodium chloride infusion   cholestyramine (QUESTRAN) packet 4 g   citalopram (CELEXA) tablet 40 mg   levothyroxine (SYNTHROID) tablet 75 mcg   DISCONTD: loperamide (IMODIUM) capsule 2 mg   magnesium oxide (MAG-OX) tablet 400 mg   sodium bicarbonate tablet 650 mg   rOPINIRole (REQUIP) tablet 3 mg   leptospermum manuka honey (MEDIHONEY) paste 1 Application   nystatin (MYCOSTATIN/NYSTOP) topical powder   sodium chloride flush (NS) 0.9 % injection 3 mL   sodium chloride flush (NS) 0.9 % injection 3 mL   sodium chloride flush (NS) 0.9 % injection 3 mL   0.9 %  sodium chloride infusion   OR Linked Order Group    acetaminophen (TYLENOL) tablet 650 mg    acetaminophen (TYLENOL) suppository 650 mg   traZODone (DESYREL) tablet 50 mg   polyethylene glycol (  MIRALAX / GLYCOLAX) packet 17 g   bisacodyl (DULCOLAX) suppository 10 mg   OR Linked Order Group    ondansetron (ZOFRAN) tablet 4 mg    ondansetron (ZOFRAN) injection 4 mg   heparin injection 5,000 Units   lipase/protease/amylase (CREON) capsule 24,000 Units   loperamide (IMODIUM) capsule 2 mg    -I have reviewed the patients home medicines and have made adjustments as needed  Critical interventions Glucose administration, D10 drip     Cardiac Monitoring: The patient was maintained on a cardiac monitor.  I personally viewed and interpreted the cardiac monitored which showed an underlying rhythm of: Sinus bradycardia  Social  Determinants of Health:  Factors impacting patients care include: Lives in skilled nursing facility   Reevaluation: After the interventions noted above, I reevaluated the patient and found that they have :stayed the same  Co morbidities that complicate the patient evaluation  Past Medical History:  Diagnosis Date   Allergy    Bradycardia    Bronchitis, chronic (HCC)    Chronic anxiety    Complication of anesthesia    hard to wake up   COPD (chronic obstructive pulmonary disease) (HCC)    Depression    Esophageal reflux    Headache(784.0)    Hyperlipidemia    Hypertension    Hypothyroidism    Obesity    Osteoporosis    Overactive bladder    PVC's (premature ventricular contractions)    Thyroid disease    Vitamin D deficiency disease       Dispostion: I considered admission for this patient, and due to persistent altered mental status and hypoglycemia of unknown origin patient require hospital admission     Final Clinical Impression(s) / ED Diagnoses Final diagnoses:  None     @PCDICTATION @    Glendora Score, MD 09/26/22 [phone]

## 2022-09-26 NOTE — Progress Notes (Signed)
1600: Wound care provided. MD Courage at bedside to evaluate pt and wounds. Stage 4 left buttocks wounds irrigated with sterile Normal Saline, then packed with saline wet to dry guaze dressing and secured with foam dressing per MD order. (Awaiting wound consult). Stage 2 left heel wound cleaned with soap and water, dried and foam dressing applied. Pt's pressure relieving boot reapplied.   1730: Pt ate applesauce and drank one ensure for supper, refused other food or fluid. Oral care provided.  Shoulder immobilizer obtained and placed on right arm for fx clavicle.

## 2022-09-27 ENCOUNTER — Telehealth (HOSPITAL_COMMUNITY): Payer: Self-pay | Admitting: Pharmacy Technician

## 2022-09-27 ENCOUNTER — Other Ambulatory Visit (HOSPITAL_COMMUNITY): Payer: Self-pay

## 2022-09-27 DIAGNOSIS — E162 Hypoglycemia, unspecified: Secondary | ICD-10-CM | POA: Diagnosis not present

## 2022-09-27 LAB — C DIFFICILE QUICK SCREEN W PCR REFLEX
C Diff antigen: POSITIVE — AB
C Diff interpretation: DETECTED
C Diff toxin: POSITIVE — AB

## 2022-09-27 LAB — GLUCOSE, CAPILLARY
Glucose-Capillary: 104 mg/dL — ABNORMAL HIGH (ref 70–99)
Glucose-Capillary: 105 mg/dL — ABNORMAL HIGH (ref 70–99)
Glucose-Capillary: 111 mg/dL — ABNORMAL HIGH (ref 70–99)
Glucose-Capillary: 117 mg/dL — ABNORMAL HIGH (ref 70–99)
Glucose-Capillary: 131 mg/dL — ABNORMAL HIGH (ref 70–99)
Glucose-Capillary: 170 mg/dL — ABNORMAL HIGH (ref 70–99)

## 2022-09-27 LAB — RENAL FUNCTION PANEL
Albumin: 2.1 g/dL — ABNORMAL LOW (ref 3.5–5.0)
Anion gap: 6 (ref 5–15)
BUN: 24 mg/dL — ABNORMAL HIGH (ref 8–23)
CO2: 30 mmol/L (ref 22–32)
Calcium: 7.9 mg/dL — ABNORMAL LOW (ref 8.9–10.3)
Chloride: 101 mmol/L (ref 98–111)
Creatinine, Ser: 1.23 mg/dL — ABNORMAL HIGH (ref 0.44–1.00)
GFR, Estimated: 46 mL/min — ABNORMAL LOW (ref >60)
Glucose, Bld: 104 mg/dL — ABNORMAL HIGH (ref 70–99)
Phosphorus: 2.5 mg/dL (ref 2.5–4.6)
Potassium: 3.2 mmol/L — ABNORMAL LOW (ref 3.5–5.1)
Sodium: 137 mmol/L (ref 135–145)

## 2022-09-27 LAB — CORTISOL-AM, BLOOD: Cortisol - AM: 9.5 ug/dL (ref 6.7–22.6)

## 2022-09-27 MED ORDER — HYDROXYZINE HCL 10 MG PO TABS
10.0000 mg | ORAL_TABLET | Freq: Three times a day (TID) | ORAL | Status: DC | PRN
  Administered 2022-09-28 – 2022-10-01 (×3): 10 mg via ORAL
  Filled 2022-09-27 (×3): qty 1

## 2022-09-27 MED ORDER — DAKINS (1/4 STRENGTH) 0.125 % EX SOLN
Freq: Two times a day (BID) | CUTANEOUS | Status: AC
  Filled 2022-09-27: qty 473

## 2022-09-27 MED ORDER — POTASSIUM CHLORIDE CRYS ER 20 MEQ PO TBCR
40.0000 meq | EXTENDED_RELEASE_TABLET | ORAL | Status: AC
  Administered 2022-09-27 (×2): 40 meq via ORAL
  Filled 2022-09-27 (×2): qty 2

## 2022-09-27 MED ORDER — ZINC SULFATE 220 (50 ZN) MG PO CAPS
220.0000 mg | ORAL_CAPSULE | Freq: Every day | ORAL | Status: DC
  Administered 2022-09-27 – 2022-10-01 (×5): 220 mg via ORAL
  Filled 2022-09-27 (×5): qty 1

## 2022-09-27 MED ORDER — HYDROXYZINE HCL 10 MG/5ML PO SYRP: 10.0000 mg | ORAL_SOLUTION | Freq: Three times a day (TID) | ORAL | Status: DC | PRN

## 2022-09-27 MED ORDER — K PHOS MONO-SOD PHOS DI & MONO 155-852-130 MG PO TABS
250.0000 mg | ORAL_TABLET | Freq: Three times a day (TID) | ORAL | Status: AC
  Administered 2022-09-27 – 2022-09-28 (×5): 250 mg via ORAL
  Filled 2022-09-27 (×5): qty 1

## 2022-09-27 MED ORDER — ZINC OXIDE 40 % EX OINT
TOPICAL_OINTMENT | Freq: Three times a day (TID) | CUTANEOUS | Status: DC
  Filled 2022-09-27: qty 57

## 2022-09-27 MED ORDER — VITAMIN C 500 MG PO TABS
500.0000 mg | ORAL_TABLET | Freq: Every day | ORAL | Status: DC
  Administered 2022-09-27 – 2022-10-01 (×5): 500 mg via ORAL
  Filled 2022-09-27 (×5): qty 1

## 2022-09-27 NOTE — Progress Notes (Addendum)
Initial Nutrition Assessment  DOCUMENTATION CODES:      INTERVENTION:  Ensure Enlive po BID- (prefers strawberry)   Assist with feeding all meals   Vitamin C 500 mg daily  Zinc 220 mg x 14 days  Protein Energy Malnutrition- education attached to AVS  NUTRITION DIAGNOSIS:   Increased nutrient needs related to wound healing as evidenced by estimated needs.   GOAL:  Patient will meet greater than or equal to 90% of their needs   MONITOR:  PO intake, Supplement acceptance, Labs, Skin, Weight trends  REASON FOR ASSESSMENT:   Consult Wound healing  ASSESSMENT: Patient is a pleasant 77 yo from SNF with hypoglycemia, right upper extremity fracture (impeding her ability to self feed). Patient has a left buttock stage 4 and left heel PI (stage 2). C.diff positive. PMH: CKD-3b   Nursing is bedside during RD visit placing new IV.   Patient ate <25% of breakfast. She requires assistance with feeding as noted above. Obtained food preferences. She doesn't like eggs and bacon exacerbates diarrhea for her. She likes oatmeal, cheerios or greek yogurt  for breakfast and will also eat cottage cheese and fruit. Prefers soft foods. Asked for tomato sandwich for lunch. She likes the high protein shake (strawberry). Encouraged protein with each meal.   Medications reviewed and include: K phos, sodium bicarbonate, vancomycin.   Weight history 67.8 kg in December 2023. Currently 67.6 kg. Fluctuations noted.   IVF- D5/NS@ 100 ml/hr.   CBG (last 3)  Recent Labs    09/27/22 0023 09/27/22 0448 09/27/22 0720  GLUCAP 105* 117* 111*       Latest Ref Rng & Units 09/27/2022    4:06 AM 09/26/2022   10:33 AM 09/24/2022   12:17 AM  BMP  Glucose 70 - 99 mg/dL 161  096  045   BUN 8 - 23 mg/dL 24  25  21    Creatinine 0.44 - 1.00 mg/dL 4.09  8.11  9.14   Sodium 135 - 145 mmol/L 137  134  136   Potassium 3.5 - 5.1 mmol/L 3.2  4.0  4.1   Chloride 98 - 111 mmol/L 101  96  96   CO2 22 - 32 mmol/L 30   30  32   Calcium 8.9 - 10.3 mg/dL 7.9  7.9  8.1       NUTRITION - FOCUSED PHYSICAL EXAM:  Flowsheet Row Most Recent Value  Orbital Region Mild depletion  Upper Arm Region Mild depletion  Thoracic and Lumbar Region No depletion  Buccal Region Mild depletion  Temple Region Mild depletion  Clavicle Bone Region Moderate depletion  Clavicle and Acromion Bone Region Severe depletion  Dorsal Hand Mild depletion  Patellar Region Moderate depletion  Anterior Thigh Region Severe depletion  Posterior Calf Region Moderate depletion  Edema (RD Assessment) None  Hair Reviewed  Eyes Reviewed  Mouth Reviewed  Skin Reviewed  [dry- forearms]  Nails Reviewed       Diet Order:   Diet Order             Diet regular Room service appropriate? Yes; Fluid consistency: Thin  Diet effective now                   EDUCATION NEEDS:  Education needs have been addressed  Skin:  Skin Assessment: Skin Integrity Issues: Skin Integrity Issues:: Stage IV, Stage II Stage II: left heel Stage IV: left buttock  Last BM:  5/3 flexiseal present (45 ml)  Height:  Ht Readings from Last 1 Encounters:  09/26/22 5\' 2"  (1.575 m)    Weight:   Wt Readings from Last 1 Encounters:  09/26/22 67.6 kg    Ideal Body Weight:   50 kg  BMI:  Body mass index is 27.25 kg/m.  Estimated Nutritional Needs:   Kcal:  1700-1800  Protein:  85-90 gr  Fluid:  >1500 ml daily   Royann Shivers MS,RD,CSG,LDN Contact: Loretha Stapler

## 2022-09-27 NOTE — TOC Benefit Eligibility Note (Signed)
Patient Product/process development scientist completed.    The patient is currently admitted and upon discharge could be taking vancomycin 125 mg.  The current 10 day co-pay is $78.70.   The patient is currently admitted and upon discharge could be taking Dificid 200 mg.  The current 10 day co-pay is $1,257.09   The patient is insured through [employer]   This test claim was processed through [facility]- copay amounts may vary at other pharmacies due to pharmacy/plan contracts, or as the patient moves through the different stages of their insurance plan.  Roland Earl, CPHT Pharmacy Patient Advocate Specialist [facility] Patient Advocate Team Direct Number: [phone]  Fax: [phone]

## 2022-09-27 NOTE — NC FL2 (Deleted)
[place] MEDICAID FL2 LEVEL OF CARE FORM     IDENTIFICATION  Patient Name: [name] Birthdate: Nov 08, 1945 Sex: female Admission Date (Current Location): 09/26/2022  [facility] and [place] Number:  [employer] and Address:  [facility],  [address], [place] [address]      Provider Number: [id number]  Attending Physician Name and Address:  Shon Hale, MD  Relative Name and Phone Number:       Current Level of Care: Hospital Recommended Level of Care: Skilled Nursing Facility Prior Approval Number:    Date Approved/Denied:   PASRR Number:    Discharge Plan: SNF    Current Diagnoses: Patient Active Problem List   Diagnosis Date Noted   Hypoglycemia 09/26/2022   Frequent falls 08/30/2022   Fall at home, initial encounter 08/29/2022   Low back pain 08/29/2022   Failure to thrive in adult 08/29/2022   Physical deconditioning 08/29/2022   Elevated CK 08/29/2022   Decubitus ulcer 08/29/2022   GERD (gastroesophageal reflux disease) 08/29/2022   Restless leg syndrome 08/29/2022   Rhabdomyolysis 07/07/2022   Dehydration 07/07/2022   Transaminitis 07/07/2022   Hypotension 07/07/2022   SIRS (systemic inflammatory response syndrome) (HCC) 07/07/2022   Hypokalemia 07/07/2022   Stage 3b chronic kidney disease (CKD) (HCC) 07/07/2022   Hypothermia 07/07/2022   Acute metabolic encephalopathy 07/06/2022   BMI 26.0-26.9,adult 05/06/2022   Atrial fibrillation (HCC) 04/12/2022   Congenital heart failure (HCC) 04/12/2022   Malnutrition of moderate degree 01/14/2022   Respiratory failure requiring intubation (HCC) 01/11/2022   Septic shock (HCC) 01/10/2022   Pressure injury of skin 01/10/2022   Small bowel obstruction (HCC) 01/07/2022   Abnormal weight loss 11/28/2021   Family history of malignant neoplasm of digestive organs 11/28/2021   Intestinal malabsorption 11/28/2021   Renal insufficiency 12/08/2020   Degeneration of lumbar intervertebral  disc 01/07/2020   Venous (peripheral) insufficiency 09/30/2019   Anemia 08/12/2017   Vitamin D deficiency 08/12/2017   Lumbar spondylosis with myelopathy 06/03/2017   Primary osteoarthritis of both knees 03/19/2017   Anxiety 02/17/2017   History of pulmonary embolus (PE) 07/09/2016   COPD (chronic obstructive pulmonary disease) (HCC) 01/18/2016   Mixed hyperlipidemia 01/18/2016   Depression 01/18/2016   Essential hypertension 01/18/2016   H/O spinal fusion 01/18/2016   Acquired hypothyroidism 01/18/2016   Thoracic aorta atherosclerosis (HCC) 01/18/2016   Chronic cholecystitis with calculus 10/04/2013    Orientation RESPIRATION BLADDER Height & Weight     Self, Time, Situation, Place  Normal Incontinent Weight: 149 lb (67.6 kg) Height:  5\' 2"  (157.5 cm)  BEHAVIORAL SYMPTOMS/MOOD NEUROLOGICAL BOWEL NUTRITION STATUS      Continent Diet (see dc summary)  AMBULATORY STATUS COMMUNICATION OF NEEDS Skin   Extensive Assist Verbally PU Stage and Appropriate Care (L buttock stage 4, L heel stage 2)       PU Stage 4 Dressing: Daily               Personal Care Assistance Level of Assistance  Bathing, Feeding, Dressing Bathing Assistance: Maximum assistance Feeding assistance: Limited assistance Dressing Assistance: Maximum assistance     Functional Limitations Info  Sight, Hearing, Speech Sight Info: Impaired Hearing Info: Impaired Speech Info: Adequate    SPECIAL CARE FACTORS FREQUENCY  PT (By licensed PT), OT (By licensed OT)     PT Frequency: 5x week OT Frequency: 3x week            Contractures Contractures Info: Not present    Additional  Factors Info  Code Status, Allergies Code Status Info: DNR Allergies Info: Celebrex (Celecoxib), Keflet (Cephalexin), Penicillins, Sulfa Antibiotics, Kenalog (Triamcinolone Acetonide), Latex, Lisinopril, Mobic (Meloxicam), Penicillin G           Current Medications (09/27/2022):  This is the current hospital active  medication list Current Facility-Administered Medications  Medication Dose Route Frequency Provider Last Rate Last Admin   0.9 %  sodium chloride infusion   Intravenous PRN [name], Courage, MD       acetaminophen (TYLENOL) tablet 650 mg  650 mg Oral Q6H PRN Emokpae, Courage, MD       Or   acetaminophen (TYLENOL) suppository 650 mg  650 mg Rectal Q6H PRN Emokpae, Courage, MD       ascorbic acid (VITAMIN C) tablet 500 mg  500 mg Oral Daily Emokpae, Courage, MD       Chlorhexidine Gluconate Cloth 2 % PADS 6 each  6 each Topical Q0600 Emokpae, Courage, MD   6 each at 09/27/22 0501   citalopram (CELEXA) tablet 40 mg  40 mg Oral Daily Emokpae, Courage, MD   40 mg at 09/27/22 1119   dextrose 5 %-0.9 % sodium chloride infusion   Intravenous Continuous Emokpae, Courage, MD 100 mL/hr at 09/27/22 0544 Started During Downtime at 09/27/22 0544   feeding supplement (ENSURE ENLIVE / ENSURE PLUS) liquid 237 mL  237 mL Oral TID Shon Hale, MD   237 mL at 09/26/22 2232   heparin injection 5,000 Units  5,000 Units Subcutaneous Q8H Emokpae, Courage, MD   5,000 Units at 09/27/22 0527   leptospermum manuka honey (MEDIHONEY) paste 1 Application  1 Application Topical Daily Emokpae, Courage, MD       levothyroxine (SYNTHROID) tablet 75 mcg  75 mcg Oral q AM [name], Courage, MD   75 mcg at 09/27/22 0527   liver oil-zinc oxide (DESITIN) 40 % ointment   Topical TID Shon Hale, MD       loperamide (IMODIUM) capsule 2 mg  2 mg Oral BID Emokpae, Courage, MD       mupirocin ointment (BACTROBAN) 2 % 1 Application  1 Application Nasal BID Shon Hale, MD   1 Application at 09/27/22 1112   nystatin (MYCOSTATIN/NYSTOP) topical powder   Topical TID Shon Hale, MD   Given at 09/26/22 2229   ondansetron (ZOFRAN) tablet 4 mg  4 mg Oral Q6H PRN Emokpae, Courage, MD       Or   ondansetron (ZOFRAN) injection 4 mg  4 mg Intravenous Q6H PRN Emokpae, Courage, MD       phosphorus (K PHOS NEUTRAL) tablet 250 mg  250  mg Oral TID [name], Courage, MD   250 mg at 09/27/22 1120   potassium chloride SA (KLOR-CON M) CR tablet 40 mEq  40 mEq Oral Q3H Emokpae, Courage, MD   40 mEq at 09/27/22 1121   rOPINIRole (REQUIP) tablet 3 mg  3 mg Oral QHS Emokpae, Courage, MD   3 mg at 09/26/22 2229   sodium bicarbonate tablet 650 mg  650 mg Oral TID Shon Hale, MD   650 mg at 09/27/22 1120   sodium chloride flush (NS) 0.9 % injection 3 mL  3 mL Intravenous Q12H Emokpae, Courage, MD       sodium chloride flush (NS) 0.9 % injection 3 mL  3 mL Intravenous Q12H Emokpae, Courage, MD   3 mL at 09/27/22 1121   sodium chloride flush (NS) 0.9 % injection 3 mL  3 mL Intravenous PRN Emokpae, Courage,  MD       sodium hypochlorite (DAKIN'S 1/4 STRENGTH) topical solution   Irrigation BID Emokpae, Courage, MD       traZODone (DESYREL) tablet 50 mg  50 mg Oral QHS PRN Emokpae, Courage, MD       vancomycin (VANCOCIN) capsule 125 mg  125 mg Oral QID [name], Courage, MD   125 mg at 09/27/22 1114   zinc sulfate capsule 220 mg  220 mg Oral Daily Shon Hale, MD         Discharge Medications: Please see discharge summary for a list of discharge medications.  Relevant Imaging Results:  Relevant Lab Results:   Additional Information    Elliot Gault, LCSW

## 2022-09-27 NOTE — Progress Notes (Signed)
PROGRESS NOTE     [name], is a 77 y.o. female, DOB - 02-18-1946, DGL:[id number]  Admit date - 09/26/2022   Admitting Physician [name] [name], MD  Outpatient Primary MD for the patient is [name], FNP  LOS - 0  Chief Complaint  Patient presents with   Hypoglycemia        Brief Narrative:  77 y.o. female with medical history significant of  hypothyroidism, GERD, RLS, CKD 3B presenting from [facility] SNF facility with episodes of recurrent hypoglycemia resulting in altered mentation admitted on 09/26/2022 and found to have C. difficile colitis and hypoglycemia    -Assessment and Plan: 1) recurrent hypoglycemia--multifactorial in the setting of poor oral intake and diarrhea  -No emesis -patient has chronic diarrhea -Also had poor appetite -Due to right upper extremity fracture she has been unable to feed himself----resulting in episodes of low sugars -Despite replacements in the ED by EMS and at SNF facility hypoglycemia persisted -Continue IV dextrose solution -Encourage better oral intake - proinsulin/insulin level pending   2) C. difficile colitis with diarrhea--oral Vanco as ordered  3) hyponatremia----most likely due to dehydration and poor oral intake -IV fluids as above   4)Sacral decubitus ulcer--POA --Left heel pressure injury noted -Large left buttock decubitus ulcer deep to the bone- please see photos in epic see photos in epic -Wound care consult appreciated   5)Hypothyroidism: Continue Synthyroid   6)RLS: Continue Requip   7)Moderate protein calorie moderation: Albumin of 2.6.  Nutritionist was consulted and following   8)CKD stage IIIb:- renally adjust medications, avoid nephrotoxic agents / dehydration  / hypotension   9)Social/Ethics--plan of care and advanced directive discussed with patient and his brother when further at bedside -Patient is a DNR/DNI without limitations to treatment otherwise   10)Acute metabolic  encephalopathy--- secondary to hypoglycemia and C. difficile infection -Should improve with treatment of above conditions    Status is: Inpatient   Disposition: The patient is from: SNF              Anticipated d/c is to: SNF              Anticipated d/c date is: 3 days              Patient currently is not medically stable to d/c. Barriers: Not Clinically Stable-   Code Status :  -  Code Status: DNR   Family Communication:   Previously discussed with patient's brother  DVT Prophylaxis  :   - SCDs   heparin injection 5,000 Units Start: 09/26/22 2200 SCDs Start: 09/26/22 1558 Place TED hose Start: 09/26/22 1558   Lab Results  Component Value Date   PLT 254 09/26/2022    Inpatient Medications  Scheduled Meds:  ascorbic acid  500 mg Oral Daily   Chlorhexidine Gluconate Cloth  6 each Topical Q0600   citalopram  40 mg Oral Daily   feeding supplement  237 mL Oral TID   heparin  5,000 Units Subcutaneous Q8H   leptospermum manuka honey  1 Application Topical Daily   levothyroxine  75 mcg Oral q AM   liver oil-zinc oxide   Topical TID   mupirocin ointment  1 Application Nasal BID   nystatin   Topical TID   phosphorus  250 mg Oral TID   rOPINIRole  3 mg Oral QHS   sodium bicarbonate  650 mg Oral TID   sodium chloride flush  3 mL Intravenous Q12H   sodium chloride flush  3 mL  Intravenous Q12H   sodium hypochlorite   Irrigation BID   vancomycin  125 mg Oral QID   zinc sulfate  220 mg Oral Daily   Continuous Infusions:  sodium chloride     dextrose 5 % and 0.9% NaCl 100 mL/hr at 09/27/22 1903   PRN Meds:.sodium chloride, acetaminophen **OR** acetaminophen, ondansetron **OR** ondansetron (ZOFRAN) IV, sodium chloride flush, traZODone   Anti-infectives (From admission, onward)    Start     Dose/Rate Route Frequency Ordered Stop   09/26/22 2215  vancomycin (VANCOCIN) capsule 125 mg        125 mg Oral 4 times daily 09/26/22 2129 10/06/22 2159          Subjective: [name] Steuck today has no fevers, no emesis,  No chest pain,   -Oral intake remains challenging -Diarrhea persist   Objective: Vitals:   09/26/22 2013 09/27/22 0208 09/27/22 0449 09/27/22 1426  BP: 123/61 (!) 115/99 134/72 (!) 137/107  Pulse: (!) 55 (!) 58 (!) 58 (!) 55  Resp: 20 18 16 18   Temp: 97.8 F (36.6 C) 98.4 F (36.9 C) 98.1 F (36.7 C) 97.6 F (36.4 C)  TempSrc:  Oral Axillary Oral  SpO2: 95% 97% 92% 98%  Weight:      Height:        Intake/Output Summary (Last 24 hours) at 09/27/2022 1957 Last data filed at 09/27/2022 1300 Gross per 24 hour  Intake 1234.34 ml  Output --  Net 1234.34 ml   Filed Weights   09/26/22 1001  Weight: 67.6 kg    Physical Exam  Gen:- Awake Alert, frail and chronically ill-appearing, HEENT:- [place].AT, No sclera icterus Neck-Supple Neck,No JVD,.  Lungs-  CTAB , fair symmetrical air movement CV- S1, S2 normal, regular  Abd-  +ve B.Sounds, Abd Soft, No tenderness,    Extremity/Skin:- No  edema, pedal pulses present , left buttock and left heel decubitus ulcers as noted Psych-affect is flat,, oriented x3 Neuro-no new focal deficits, no tremors Rectum----Flexi-Seal with liquid stool  Data Reviewed: I have personally reviewed following labs and imaging studies  CBC: Recent Labs  Lab 09/24/22 0017 09/26/22 1033  WBC 9.4 9.2  NEUTROABS  --  7.8*  HGB 10.3* 9.4*  HCT 32.6* 30.5*  MCV 97.9 99.0  PLT 316 254   Basic Metabolic Panel: Recent Labs  Lab 09/24/22 0017 09/26/22 1033 09/27/22 0406  NA 136 134* 137  K 4.1 4.0 3.2*  CL 96* 96* 101  CO2 32 30 30  GLUCOSE 101* 147* 104*  BUN 21 25* 24*  CREATININE 1.42* 1.35* 1.23*  CALCIUM 8.1* 7.9* 7.9*  PHOS  --   --  2.5   GFR: Estimated Creatinine Clearance: 35.1 mL/min (A) (by C-G formula based on SCr of 1.23 mg/dL (H)). Liver Function Tests: Recent Labs  Lab 09/24/22 0017 09/26/22 1033 09/27/22 0406  AST 23 21  --   ALT 14 14  --   ALKPHOS 175* 138*  --    BILITOT 0.7 0.6  --   PROT 6.3* 5.3*  --   ALBUMIN 2.6* 2.3* 2.1*   Cardiac Enzymes: Recent Labs  Lab 09/26/22 1033  CKTOTAL 44   BNP (last 3 results) No results for input(s): "PROBNP" in the last 8760 hours. HbA1C: No results for input(s): "HGBA1C" in the last 72 hours. Sepsis Labs: @LABRCNTIP (procalcitonin:4,lacticidven:4) ) Recent Results (from the past 240 hour(s))  MRSA Next Gen by PCR, Nasal     Status: Abnormal   Collection Time: 09/26/22  5:25 PM   Specimen: Nasal Mucosa; Nasal Swab  Result Value Ref Range Status   MRSA by PCR Next Gen DETECTED (A) NOT DETECTED Final    Comment: RESULT CALLED TO, READ BACK BY AND VERIFIED WITH: A. BAILEY AT 2215 ON 05.02.24 BY ADGER J         The GeneXpert MRSA Assay (FDA approved for NASAL specimens only), is one component of a comprehensive MRSA colonization surveillance program. It is not intended to diagnose MRSA infection nor to guide or monitor treatment for MRSA infections. Performed at [facility], [address], [place], [place] [address]   C Difficile Quick Screen w PCR reflex     Status: Abnormal   Collection Time: 09/26/22  7:04 PM   Specimen: STOOL  Result Value Ref Range Status   C Diff antigen POSITIVE (A) NEGATIVE Final    Comment: CRITICAL RESULT CALLED TO, READ BACK BY AND VERIFIED WITH: A.BALIEY AT 2116 ON 05.02.2024 BY ADGER J     C Diff toxin POSITIVE (A) NEGATIVE Final    Comment: CRITICAL RESULT CALLED TO, READ BACK BY AND VERIFIED WITH: A. BALIEY AT 2116 ON 05.02.22024 BY ADGER J     C Diff interpretation Toxin producing C. difficile detected.  Final    Comment: Performed at [facility], [address] [place], [place] [address]      Radiology Studies: CT Head Wo Contrast  Result Date: 09/26/2022 CLINICAL DATA:  Delirium.  Altered mental status. EXAM: CT HEAD WITHOUT CONTRAST TECHNIQUE: Contiguous axial images were obtained from the base of the skull through the vertex without intravenous  contrast. RADIATION DOSE REDUCTION: This exam was performed according to the departmental dose-optimization program which includes automated exposure control, adjustment of the mA and/or kV according to patient size and/or use of iterative reconstruction technique. COMPARISON:  Head CT 09/24/2022. FINDINGS: Brain: No acute hemorrhage. Unchanged mild chronic small-vessel disease. Cortical gray-white differentiation is otherwise preserved. No hydrocephalus or extra-axial collection. No mass effect or midline shift. Vascular: No hyperdense vessel or unexpected calcification. Skull: No calvarial fracture or suspicious bone lesion. Skull base is unremarkable. Sinuses/Orbits: Mucous retention cysts in the left maxillary and left inferior frontal sinus. Orbits are unremarkable. Other: None. IMPRESSION: 1. No acute intracranial abnormality. 2. Unchanged mild chronic small-vessel disease. Electronically Signed   By: Orvan Falconer M.D.   On: 09/26/2022 12:15     Scheduled Meds:  ascorbic acid  500 mg Oral Daily   Chlorhexidine Gluconate Cloth  6 each Topical Q0600   citalopram  40 mg Oral Daily   feeding supplement  237 mL Oral TID   heparin  5,000 Units Subcutaneous Q8H   leptospermum manuka honey  1 Application Topical Daily   levothyroxine  75 mcg Oral q AM   liver oil-zinc oxide   Topical TID   mupirocin ointment  1 Application Nasal BID   nystatin   Topical TID   phosphorus  250 mg Oral TID   rOPINIRole  3 mg Oral QHS   sodium bicarbonate  650 mg Oral TID   sodium chloride flush  3 mL Intravenous Q12H   sodium chloride flush  3 mL Intravenous Q12H   sodium hypochlorite   Irrigation BID   vancomycin  125 mg Oral QID   zinc sulfate  220 mg Oral Daily   Continuous Infusions:  sodium chloride     dextrose 5 % and 0.9% NaCl 100 mL/hr at 09/27/22 1903     LOS: 0 days  Shon Hale M.D on 09/27/2022 at 7:57 PM  Go to [email or web address] - for contact info  [facility] - Office   [phone]  If 7PM-7AM, please contact night-coverage [email or web address] 09/27/2022, 7:57 PM

## 2022-09-27 NOTE — Consult Note (Signed)
WOC Nurse Consult Note: Reason for Consult:Left buttock Stage 4 pressure injury. Seen by my associate during last admission. See photodocumentation provided by Provider upon admission. Also healing stage 2 PI to left heel Wound type:pressure Pressure Injury POA: Yes Measurement:Per nursing flow sheet from 09/26/22: Left buttock Stage 4: 8.5cm x 8cm x 0.5cm with 60% red wound bed and 40% yellow fibrinous slough. Moderate exudate consistent with autolytically debriding yellow slough. Left heel with healing stage 2 PI measuring 2.5cm x 2cm, scant serous exudate Wound bed:As noted above Drainage (amount, consistency, odor) As noted above Periwound:mild erythema Dressing procedure/placement/frequency: A mattress replacement is provided as well as bilateral Prevalon boots. Turning and repositioning is in place; guidance is provided to minimize time in the supine position. Topical care to the buttock Stage 4 pressure injury will be with twice daily dressings using sodium hypochlorite solution for three days. After 3 days, twice daily dressings using normal saline is recommended. These are to be topped with dry dressings and secured with silicone foam. After this care, it is likely that use of a silver hydrofiber dressing applied once daily would be appropriate. Wound care to the left heel will be with once daily application of xeroform gauze (antimicrobial, nonadherent) topped with dry gauze and secured with Kerlix roll gauze/paper tape. The heel is to be placed into a pressure redistribution heel boot.  WOC nursing team will not follow, but will remain available to this patient, the nursing and medical teams.  Please re-consult if needed.  Thank you for inviting [place] to participate in this patient's Plan of Care.  Ladona Mow, MSN, RN, CNS, GNP, Leda Min, [employer], [employer] phone:  [phone]

## 2022-09-27 NOTE — TOC Initial Note (Signed)
Transition of Care [facility]) - Initial/Assessment Note    Patient Details  Name: [name] MRN: [id number] Date of Birth: 1945/06/11  Transition of Care [facility]) CM/SW Contact:    Elliot Gault, LCSW Phone Number: 09/27/2022, 1:10 PM  Clinical Narrative:                  Pt admitted from [facility]. Met with pt at bedside to assess. Per pt, she plans to return to her private room at [facility] at [employer]. Pt will need EMS transport.   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 TOC will follow.  Expected Discharge Plan: Skilled Nursing Facility Barriers to Discharge: Continued Medical Work up   Patient Goals and CMS Choice Patient states their goals for this hospitalization and ongoing recovery are:: return to private room at [facility]          Expected Discharge Plan and Services In-house Referral: Clinical Social Work   Post Acute Care Choice: Resumption of Svcs/PTA Provider Living arrangements for the past 2 months: Skilled Nursing Facility                                      Prior Living Arrangements/Services Living arrangements for the past 2 months: Skilled Nursing Facility Lives with:: Facility Resident Patient language and need for interpreter reviewed:: Yes Do you feel safe going back to the place where you live?: Yes      Need for Family Participation in Patient Care: No (Comment) Care giver support system in place?: Yes (comment)   Criminal Activity/Legal Involvement Pertinent to Current Situation/Hospitalization: No - Comment as needed  Activities of Daily Living Home Assistive Devices/Equipment: Eyeglasses, Environmental consultant (specify type) ADL Screening (condition at time of admission) Patient's cognitive ability adequate to safely complete daily activities?: No Is the patient deaf or have difficulty hearing?: No Does the patient have difficulty seeing, even when wearing glasses/contacts?: No Does the patient have difficulty concentrating, remembering, or  making decisions?: Yes Patient able to express need for assistance with ADLs?: No Does the patient have difficulty dressing or bathing?: Yes Independently performs ADLs?: No Communication: Independent Dressing (OT): Dependent Is this a change from baseline?: Change from baseline, expected to last >3 days Grooming: Dependent Is this a change from baseline?: Change from baseline, expected to last >3 days Feeding: Needs assistance Is this a change from baseline?: Change from baseline, expected to last >3 days Bathing: Dependent Is this a change from baseline?: Change from baseline, expected to last >3 days Toileting: Dependent Is this a change from baseline?: Change from baseline, expected to last >3days In/Out Bed: Dependent Is this a change from baseline?: Change from baseline, expected to last >3 days Walks in Home: Dependent Is this a change from baseline?: Change from baseline, expected to last >3 days Does the patient have difficulty walking or climbing stairs?: Yes Weakness of Legs: Both Weakness of Arms/Hands: Both  Permission Sought/Granted Permission sought to share information with : Oceanographer granted to share information with : Yes, Verbal Permission Granted     Permission granted to share info w AGENCY: [facility]        Emotional Assessment Appearance:: Appears stated age Attitude/Demeanor/Rapport: Engaged Affect (typically observed): Pleasant Orientation: : Oriented to Self, Oriented to Place, Oriented to  Time, Oriented to Situation Alcohol / Substance Use: Not Applicable Psych Involvement: No (comment)  Admission diagnosis:  Hypoglycemia [E16.2] Patient  Active Problem List   Diagnosis Date Noted   Hypoglycemia 09/26/2022   Frequent falls 08/30/2022   Fall at home, initial encounter 08/29/2022   Low back pain 08/29/2022   Failure to thrive in adult 08/29/2022   Physical deconditioning 08/29/2022   Elevated CK 08/29/2022    Decubitus ulcer 08/29/2022   GERD (gastroesophageal reflux disease) 08/29/2022   Restless leg syndrome 08/29/2022   Rhabdomyolysis 07/07/2022   Dehydration 07/07/2022   Transaminitis 07/07/2022   Hypotension 07/07/2022   SIRS (systemic inflammatory response syndrome) (HCC) 07/07/2022   Hypokalemia 07/07/2022   Stage 3b chronic kidney disease (CKD) (HCC) 07/07/2022   Hypothermia 07/07/2022   Acute metabolic encephalopathy 07/06/2022   BMI 26.0-26.9,adult 05/06/2022   Atrial fibrillation (HCC) 04/12/2022   Congenital heart failure (HCC) 04/12/2022   Malnutrition of moderate degree 01/14/2022   Respiratory failure requiring intubation (HCC) 01/11/2022    Class: Acute   Septic shock (HCC) 01/10/2022   Pressure injury of skin 01/10/2022   Small bowel obstruction (HCC) 01/07/2022   Abnormal weight loss 11/28/2021   Family history of malignant neoplasm of digestive organs 11/28/2021   Intestinal malabsorption 11/28/2021   Renal insufficiency 12/08/2020   Degeneration of lumbar intervertebral disc 01/07/2020   Venous (peripheral) insufficiency 09/30/2019   Anemia 08/12/2017   Vitamin D deficiency 08/12/2017   Lumbar spondylosis with myelopathy 06/03/2017   Primary osteoarthritis of both knees 03/19/2017   Anxiety 02/17/2017   History of pulmonary embolus (PE) 07/09/2016   COPD (chronic obstructive pulmonary disease) (HCC) 01/18/2016   Mixed hyperlipidemia 01/18/2016   Depression 01/18/2016   Essential hypertension 01/18/2016   H/O spinal fusion 01/18/2016   Acquired hypothyroidism 01/18/2016   Thoracic aorta atherosclerosis (HCC) 01/18/2016   Chronic cholecystitis with calculus 10/04/2013   PCP:  [name], FNP Pharmacy:   [facility] - [place], [place] - [address] [place] [address] Phone: [phone] Fax: [phone]  [facility] - [place], [place] - [address] [place] [address] Phone:  [phone] Fax: [phone]     Social Determinants of Health (SDOH) Social History: SDOH Screenings   Food Insecurity: No Food Insecurity (09/26/2022)  Housing: Low Risk  (09/26/2022)  Transportation Needs: No Transportation Needs (09/26/2022)  Utilities: Not At Risk (09/26/2022)  Alcohol Screen: Low Risk  (11/28/2021)  Depression (PHQ2-9): Low Risk  (05/06/2022)  Financial Resource Strain: Low Risk  (11/28/2021)  Physical Activity: Inactive (11/28/2021)  Social Connections: Socially Integrated (11/28/2021)  Stress: Stress Concern Present (11/28/2021)  Tobacco Use: Low Risk  (09/26/2022)   SDOH Interventions:     Readmission Risk Interventions    08/30/2022    3:36 PM  Readmission Risk Prevention Plan  Transportation Screening Complete  PCP or Specialist Appt within 3-5 Days Complete  Social Work Consult for Recovery Care Planning/Counseling Complete  Palliative Care Screening Not Applicable  Medication Review Oceanographer) Complete

## 2022-09-27 NOTE — Telephone Encounter (Signed)
Pharmacy Patient Advocate Encounter  Insurance verification completed.    The patient is insured through [facility]   The patient is currently admitted and ran test claims for the following: Dificid, Vancomycin.  Copays and coinsurance results were relayed to Inpatient clinical team.

## 2022-09-28 DIAGNOSIS — F32A Depression, unspecified: Secondary | ICD-10-CM | POA: Diagnosis not present

## 2022-09-28 DIAGNOSIS — E860 Dehydration: Secondary | ICD-10-CM | POA: Diagnosis present

## 2022-09-28 DIAGNOSIS — I130 Hypertensive heart and chronic kidney disease with heart failure and stage 1 through stage 4 chronic kidney disease, or unspecified chronic kidney disease: Secondary | ICD-10-CM | POA: Diagnosis not present

## 2022-09-28 DIAGNOSIS — F0390 Unspecified dementia without behavioral disturbance: Secondary | ICD-10-CM | POA: Diagnosis present

## 2022-09-28 DIAGNOSIS — E785 Hyperlipidemia, unspecified: Secondary | ICD-10-CM | POA: Diagnosis present

## 2022-09-28 DIAGNOSIS — I4891 Unspecified atrial fibrillation: Secondary | ICD-10-CM | POA: Diagnosis present

## 2022-09-28 DIAGNOSIS — R296 Repeated falls: Secondary | ICD-10-CM | POA: Diagnosis present

## 2022-09-28 DIAGNOSIS — Z743 Need for continuous supervision: Secondary | ICD-10-CM | POA: Diagnosis not present

## 2022-09-28 DIAGNOSIS — I5032 Chronic diastolic (congestive) heart failure: Secondary | ICD-10-CM | POA: Diagnosis not present

## 2022-09-28 DIAGNOSIS — N1832 Chronic kidney disease, stage 3b: Secondary | ICD-10-CM | POA: Diagnosis present

## 2022-09-28 DIAGNOSIS — J90 Pleural effusion, not elsewhere classified: Secondary | ICD-10-CM | POA: Diagnosis not present

## 2022-09-28 DIAGNOSIS — F419 Anxiety disorder, unspecified: Secondary | ICD-10-CM | POA: Diagnosis present

## 2022-09-28 DIAGNOSIS — K449 Diaphragmatic hernia without obstruction or gangrene: Secondary | ICD-10-CM | POA: Diagnosis not present

## 2022-09-28 DIAGNOSIS — R279 Unspecified lack of coordination: Secondary | ICD-10-CM | POA: Diagnosis not present

## 2022-09-28 DIAGNOSIS — G9341 Metabolic encephalopathy: Secondary | ICD-10-CM | POA: Diagnosis present

## 2022-09-28 DIAGNOSIS — Z7189 Other specified counseling: Secondary | ICD-10-CM | POA: Diagnosis not present

## 2022-09-28 DIAGNOSIS — G2581 Restless legs syndrome: Secondary | ICD-10-CM | POA: Diagnosis present

## 2022-09-28 DIAGNOSIS — E162 Hypoglycemia, unspecified: Secondary | ICD-10-CM | POA: Diagnosis present

## 2022-09-28 DIAGNOSIS — I129 Hypertensive chronic kidney disease with stage 1 through stage 4 chronic kidney disease, or unspecified chronic kidney disease: Secondary | ICD-10-CM | POA: Diagnosis present

## 2022-09-28 DIAGNOSIS — M810 Age-related osteoporosis without current pathological fracture: Secondary | ICD-10-CM | POA: Diagnosis present

## 2022-09-28 DIAGNOSIS — L89322 Pressure ulcer of left buttock, stage 2: Secondary | ICD-10-CM | POA: Diagnosis not present

## 2022-09-28 DIAGNOSIS — W19XXXA Unspecified fall, initial encounter: Secondary | ICD-10-CM | POA: Diagnosis present

## 2022-09-28 DIAGNOSIS — E440 Moderate protein-calorie malnutrition: Secondary | ICD-10-CM | POA: Diagnosis present

## 2022-09-28 DIAGNOSIS — J4489 Other specified chronic obstructive pulmonary disease: Secondary | ICD-10-CM | POA: Diagnosis present

## 2022-09-28 DIAGNOSIS — R627 Adult failure to thrive: Secondary | ICD-10-CM | POA: Diagnosis present

## 2022-09-28 DIAGNOSIS — Z90710 Acquired absence of both cervix and uterus: Secondary | ICD-10-CM | POA: Diagnosis not present

## 2022-09-28 DIAGNOSIS — E039 Hypothyroidism, unspecified: Secondary | ICD-10-CM | POA: Diagnosis present

## 2022-09-28 DIAGNOSIS — L89620 Pressure ulcer of left heel, unstageable: Secondary | ICD-10-CM | POA: Diagnosis not present

## 2022-09-28 DIAGNOSIS — R52 Pain, unspecified: Secondary | ICD-10-CM | POA: Diagnosis not present

## 2022-09-28 DIAGNOSIS — I1310 Hypertensive heart and chronic kidney disease without heart failure, with stage 1 through stage 4 chronic kidney disease, or unspecified chronic kidney disease: Secondary | ICD-10-CM | POA: Diagnosis not present

## 2022-09-28 DIAGNOSIS — Z515 Encounter for palliative care: Secondary | ICD-10-CM | POA: Diagnosis not present

## 2022-09-28 DIAGNOSIS — L89622 Pressure ulcer of left heel, stage 2: Secondary | ICD-10-CM | POA: Diagnosis present

## 2022-09-28 DIAGNOSIS — F03918 Unspecified dementia, unspecified severity, with other behavioral disturbance: Secondary | ICD-10-CM | POA: Diagnosis not present

## 2022-09-28 DIAGNOSIS — L89324 Pressure ulcer of left buttock, stage 4: Secondary | ICD-10-CM | POA: Diagnosis present

## 2022-09-28 DIAGNOSIS — E871 Hypo-osmolality and hyponatremia: Secondary | ICD-10-CM | POA: Diagnosis present

## 2022-09-28 DIAGNOSIS — D649 Anemia, unspecified: Secondary | ICD-10-CM | POA: Diagnosis not present

## 2022-09-28 DIAGNOSIS — L89154 Pressure ulcer of sacral region, stage 4: Secondary | ICD-10-CM | POA: Diagnosis not present

## 2022-09-28 DIAGNOSIS — I82402 Acute embolism and thrombosis of unspecified deep veins of left lower extremity: Secondary | ICD-10-CM | POA: Diagnosis not present

## 2022-09-28 DIAGNOSIS — Z66 Do not resuscitate: Secondary | ICD-10-CM | POA: Diagnosis present

## 2022-09-28 DIAGNOSIS — D631 Anemia in chronic kidney disease: Secondary | ICD-10-CM | POA: Diagnosis present

## 2022-09-28 DIAGNOSIS — A0472 Enterocolitis due to Clostridium difficile, not specified as recurrent: Secondary | ICD-10-CM | POA: Diagnosis present

## 2022-09-28 DIAGNOSIS — S42001D Fracture of unspecified part of right clavicle, subsequent encounter for fracture with routine healing: Secondary | ICD-10-CM | POA: Diagnosis not present

## 2022-09-28 LAB — BASIC METABOLIC PANEL
Anion gap: 4 — ABNORMAL LOW (ref 5–15)
BUN: 22 mg/dL (ref 8–23)
CO2: 27 mmol/L (ref 22–32)
Calcium: 7.5 mg/dL — ABNORMAL LOW (ref 8.9–10.3)
Chloride: 109 mmol/L (ref 98–111)
Creatinine, Ser: 1.12 mg/dL — ABNORMAL HIGH (ref 0.44–1.00)
GFR, Estimated: 51 mL/min — ABNORMAL LOW (ref >60)
Glucose, Bld: 165 mg/dL — ABNORMAL HIGH (ref 70–99)
Potassium: 3.5 mmol/L (ref 3.5–5.1)
Sodium: 140 mmol/L (ref 135–145)

## 2022-09-28 LAB — GLUCOSE, CAPILLARY
Glucose-Capillary: 107 mg/dL — ABNORMAL HIGH (ref 70–99)
Glucose-Capillary: 72 mg/dL (ref 70–99)
Glucose-Capillary: 79 mg/dL (ref 70–99)
Glucose-Capillary: 79 mg/dL (ref 70–99)
Glucose-Capillary: 89 mg/dL (ref 70–99)
Glucose-Capillary: 96 mg/dL (ref 70–99)
Glucose-Capillary: 98 mg/dL (ref 70–99)

## 2022-09-28 MED ORDER — MEGESTROL ACETATE 400 MG/10ML PO SUSP
400.0000 mg | Freq: Every day | ORAL | Status: DC
  Administered 2022-09-28 – 2022-10-01 (×4): 400 mg via ORAL
  Filled 2022-09-28 (×4): qty 10

## 2022-09-28 MED ORDER — POTASSIUM CHLORIDE CRYS ER 20 MEQ PO TBCR
40.0000 meq | EXTENDED_RELEASE_TABLET | Freq: Once | ORAL | Status: AC
  Administered 2022-09-28: 40 meq via ORAL
  Filled 2022-09-28: qty 2

## 2022-09-28 NOTE — Progress Notes (Signed)
PROGRESS NOTE     [name], is a 77 y.o. female, DOB - [birth year], WUJ:[id number]  Admit date - 09/26/2022   Admitting Physician Dyrell Tuccillo [name], MD  Outpatient Primary MD for the patient is [name], FNP  LOS - 0  Chief Complaint  Patient presents with   Hypoglycemia        Brief Narrative:  77 y.o. female with medical history significant of  hypothyroidism, GERD, RLS, CKD 3B presenting from [facility] SNF facility with episodes of recurrent hypoglycemia resulting in altered mentation admitted on 09/26/2022 and found to have C. difficile colitis and hypoglycemia    -Assessment and Plan: 1)Recurrent Hypoglycemia--multifactorial in the setting of poor oral intake and diarrhea  09/28/22 -No emesis --Poor appetite persist  -patient refusing to eat -Also had poor appetite -Due to right upper extremity fracture she has been unable to feed himself----resulting in episodes of low sugars --Continue IV dextrose solution -Encourage better oral intake - proinsulin/insulin level pending -Start Megace for appetite stimulation -Accu-Cheks with glucose in the 70s despite continuous dextrose infusion   2) C. difficile colitis with diarrhea--oral Vanco as ordered -Patient was treated for C. difficile infection almost 4 weeks ago -She will need a slow oral Vanco taper at this time  3)Hyponatremia----most likely due to dehydration and poor oral intake -Hyponatremia resolved with hydration  4)Acute metabolic encephalopathy--- secondary to hypoglycemia and C. difficile infection -Should improve with treatment of above conditions   5)Hypothyroidism: Continue Synthyroid   6)RLS: Continue Requip   7)Moderate protein calorie moderation: Albumin of 2.6.  Nutritionist was consulted and following   8)CKD stage IIIb:- renally adjust medications, avoid nephrotoxic agents / dehydration  / hypotension   9)Social/Ethics--plan of care and advanced directive discussed with patient and  his brother when further at bedside -Patient is a DNR/DNI without limitations to treatment otherwise  10)Sacral decubitus ulcer--POA --Left heel pressure injury noted -Large left buttock decubitus ulcer deep to the bone- please see photos in epic see photos in epic -Wound care consult appreciated Left buttock Stage 4 pressure injury --POA -Left heel with healing stage 2 PI measuring 2.5cm x 2cm, scant serous exudate  -Wound care consult appreciated please see below  Wound / Incision (Open or Dehisced) 01/23/22 (IAD) Incontinence Associated Dermatitis Thigh Anterior;Right;Left (Active)  Date First Assessed/Time First Assessed: 01/23/22 0800   Wound Type: (IAD) Incontinence Associated Dermatitis  Location: Thigh  Location Orientation: Anterior;Right;Left  Present on Admission: No    Assessments 01/23/2022  8:33 AM 02/04/2022  7:25 AM  Dressing Type None None  Dressing Status None None  Site / Wound Assessment Pink;Red Pink;Red  Peri-wound Assessment Intact Intact  Drainage Amount None None     No associated orders.     Pressure Injury 09/26/22 Buttocks Left;Medial;Mid Stage 4 - Full thickness tissue loss with exposed bone, tendon or muscle. 8.5cm x 8cm x 0.5cm -  wound with tunneling/undermining edges at 12 o'clock to 6 o'clock positions 1cm. Wound base with thick whit (Active)  Date First Assessed/Time First Assessed: 09/26/22 1500   Location: Buttocks  Location Orientation: Left;Medial;Mid  Staging: Stage 4 - Full thickness tissue loss with exposed bone, tendon or muscle.  Wound Description (Comments): 8.5cm x 8cm x 0.5cm -...    Assessments 09/26/2022  4:00 PM 09/28/2022  5:38 AM  Dressing Type Foam - Lift dressing to assess site every shift Foam - Lift dressing to assess site every shift;Dakin's-soaked gauze  Dressing Dry;Intact;Changed Clean, Dry, Intact;Changed  State of Healing Non-healing --  Site / Wound Assessment Pink;Yellow --  % Wound base Red or Granulating 0% --  % Wound base  Yellow/Fibrinous Exudate 40% --  % Wound base Black/Eschar 0% --  % Wound base Other/Granulation Tissue (Comment) 60% --  Peri-wound Assessment Erythema (blanchable) --  Wound Length (cm) 8.5 cm --  Wound Width (cm) 8 cm --  Wound Depth (cm) 1 cm --  Wound Surface Area (cm^2) 68 cm^2 --  Wound Volume (cm^3) 68 cm^3 --  Tunneling (cm) 1 --  Undermining (cm) 1 --  Margins Epibole (rolled edges) --  Drainage Amount Moderate --  Drainage Description Purulent --  Treatment Cleansed;Packing (Saline gauze);Packing (Dry gauze) --     No associated orders.     Pressure Injury 09/26/22 Heel Left Stage 2 -  Partial thickness loss of dermis presenting as a shallow open injury with a red, pink wound bed without slough. 2.5cm x 2cm x 0 cm. Wound bed pink with some yellow at very center. Periwound area blanchable wi (Active)  Date First Assessed/Time First Assessed: 09/26/22 1500   Location: Heel  Location Orientation: Left  Staging: Stage 2 -  Partial thickness loss of dermis presenting as a shallow open injury with a red, pink wound bed without slough.  Wound Description...    Assessments 09/26/2022  4:00 PM 09/28/2022  5:38 AM  Dressing Type Foam - Lift dressing to assess site every shift Gauze (Comment);Impregnated gauze (petrolatum)  Dressing Clean, Dry, Intact;Changed Clean, Dry, Intact;Changed  Dressing Change Frequency PRN --  State of Healing Early/partial granulation --  Site / Wound Assessment Clean;Dry;Painful;Pink --  Peri-wound Assessment Other (Comment) --  Wound Length (cm) 2.5 cm --  Wound Width (cm) 2.5 cm --  Wound Depth (cm) 0 cm --  Wound Surface Area (cm^2) 6.25 cm^2 --  Wound Volume (cm^3) 0 cm^3 --  Tunneling (cm) no --  Margins Unattached edges (unapproximated) --  Drainage Amount Scant --  Drainage Description Serous --     No associated orders.   Status is: Inpatient   Disposition: The patient is from: SNF              Anticipated d/c is to: SNF               Anticipated d/c date is: 3 days              Patient currently is not medically stable to d/c. Barriers: Not Clinically Stable-   Code Status :  -  Code Status: DNR   Family Communication:   Previously discussed with patient's brother I Called and updated patient's son and POA [name]  DVT Prophylaxis  :   - SCDs   heparin injection 5,000 Units Start: 09/26/22 2200 SCDs Start: 09/26/22 1558 Place TED hose Start: 09/26/22 1558   Lab Results  Component Value Date   PLT 254 09/26/2022    Inpatient Medications  Scheduled Meds:  ascorbic acid  500 mg Oral Daily   Chlorhexidine Gluconate Cloth  6 each Topical Q0600   citalopram  40 mg Oral Daily   feeding supplement  237 mL Oral TID   heparin  5,000 Units Subcutaneous Q8H   leptospermum manuka honey  1 Application Topical Daily   levothyroxine  75 mcg Oral q AM   liver oil-zinc oxide   Topical TID   megestrol  400 mg Oral Daily   mupirocin ointment  1 Application Nasal BID   nystatin   Topical TID   phosphorus  250 mg Oral TID   rOPINIRole  3 mg Oral QHS   sodium bicarbonate  650 mg Oral TID   sodium chloride flush  3 mL Intravenous Q12H   sodium chloride flush  3 mL Intravenous Q12H   sodium hypochlorite   Irrigation BID   vancomycin  125 mg Oral QID   zinc sulfate  220 mg Oral Daily   Continuous Infusions:  sodium chloride     dextrose 5 % and 0.9% NaCl 100 mL/hr at 09/28/22 1436   PRN Meds:.sodium chloride, acetaminophen **OR** acetaminophen, hydrOXYzine, ondansetron **OR** ondansetron (ZOFRAN) IV, sodium chloride flush, traZODone   Anti-infectives (From admission, onward)    Start     Dose/Rate Route Frequency Ordered Stop   09/26/22 2215  vancomycin (VANCOCIN) capsule 125 mg        125 mg Oral 4 times daily 09/26/22 2129 10/06/22 2159       Subjective: Veronia Beets today has no fevers, no emesis,  No chest pain,   -Oral intake remains challenging -Diarrhea persist--liquid stool in Flexi-Seal -Patient  continues to refuse to eat from time to time -Accu-Cheks with glucose in the 70s despite continuous dextrose infusion  Objective: Vitals:   09/27/22 1426 09/27/22 2019 09/28/22 0524 09/28/22 1451  BP: (!) 137/107 123/63 123/75 124/70  Pulse: (!) 55 (!) 55 (!) 59 (!) 53  Resp: 18 20 18 18   Temp: 97.6 F (36.4 C) 98.2 F (36.8 C) 98.1 F (36.7 C) 98.5 F (36.9 C)  TempSrc: Oral Oral Axillary Axillary  SpO2: 98% 100% 96% 100%  Weight:      Height:        Intake/Output Summary (Last 24 hours) at 09/28/2022 1505 Last data filed at 09/28/2022 1020 Gross per 24 hour  Intake 180 ml  Output 3500 ml  Net -3320 ml   Filed Weights   09/26/22 1001  Weight: 67.6 kg    Physical Exam  Gen:- Awake Alert, frail and chronically ill-appearing, HEENT:- [place].AT, No sclera icterus Neck-Supple Neck,No JVD,.  Lungs-  CTAB , fair symmetrical air movement CV- S1, S2 normal, regular  Abd-  +ve B.Sounds, Abd Soft, No tenderness,    Extremity/Skin:- No  edema, pedal pulses present , left buttock and left heel decubitus ulcers as noted Psych-affect is flat,, oriented x3 Neuro-generalized weakness no new focal deficits, no tremors Rectum----Flexi-Seal with liquid stool  Data Reviewed: I have personally reviewed following labs and imaging studies  CBC: Recent Labs  Lab 09/24/22 0017 09/26/22 1033  WBC 9.4 9.2  NEUTROABS  --  7.8*  HGB 10.3* 9.4*  HCT 32.6* 30.5*  MCV 97.9 99.0  PLT 316 254   Basic Metabolic Panel: Recent Labs  Lab 09/24/22 0017 09/26/22 1033 09/27/22 0406 09/28/22 0436  NA 136 134* 137 140  K 4.1 4.0 3.2* 3.5  CL 96* 96* 101 109  CO2 32 30 30 27   GLUCOSE 101* 147* 104* 165*  BUN 21 25* 24* 22  CREATININE 1.42* 1.35* 1.23* 1.12*  CALCIUM 8.1* 7.9* 7.9* 7.5*  PHOS  --   --  2.5  --    GFR: Estimated Creatinine Clearance: 38.5 mL/min (A) (by C-G formula based on SCr of 1.12 mg/dL (H)). Liver Function Tests: Recent Labs  Lab 09/24/22 0017 09/26/22 1033  09/27/22 0406  AST 23 21  --   ALT 14 14  --   ALKPHOS 175* 138*  --   BILITOT 0.7 0.6  --   PROT 6.3* 5.3*  --  ALBUMIN 2.6* 2.3* 2.1*   Cardiac Enzymes: Recent Labs  Lab 09/26/22 1033  CKTOTAL 44   Recent Results (from the past 240 hour(s))  MRSA Next Gen by PCR, Nasal     Status: Abnormal   Collection Time: 09/26/22  5:25 PM   Specimen: Nasal Mucosa; Nasal Swab  Result Value Ref Range Status   MRSA by PCR Next Gen DETECTED (A) NOT DETECTED Final    Comment: RESULT CALLED TO, READ BACK BY AND VERIFIED WITH: A. BAILEY AT 2215 ON 05.02.24 BY ADGER J         The GeneXpert MRSA Assay (FDA approved for NASAL specimens only), is one component of a comprehensive MRSA colonization surveillance program. It is not intended to diagnose MRSA infection nor to guide or monitor treatment for MRSA infections. Performed at [facility], [address], [place], [place] [address]   C Difficile Quick Screen w PCR reflex     Status: Abnormal   Collection Time: 09/26/22  7:04 PM   Specimen: STOOL  Result Value Ref Range Status   C Diff antigen POSITIVE (A) NEGATIVE Final    Comment: CRITICAL RESULT CALLED TO, READ BACK BY AND VERIFIED WITH: A.BALIEY AT 2116 ON 05.02.2024 BY ADGER J     C Diff toxin POSITIVE (A) NEGATIVE Final    Comment: CRITICAL RESULT CALLED TO, READ BACK BY AND VERIFIED WITH: A. BALIEY AT 2116 ON 05.02.22024 BY ADGER J     C Diff interpretation Toxin producing C. difficile detected.  Final    Comment: CRITICAL RESULT CALLED TO, READ BACK BY AND VERIFIED WITH: A. BAILEY AT 2116 ON [id number] BY ADGER J Performed at [facility], [address] [place], [place] [address]       Radiology Studies: No results found.   Scheduled Meds:  ascorbic acid  500 mg Oral Daily   Chlorhexidine Gluconate Cloth  6 each Topical Q0600   citalopram  40 mg Oral Daily   feeding supplement  237 mL Oral TID   heparin  5,000 Units Subcutaneous Q8H   leptospermum manuka honey  1  Application Topical Daily   levothyroxine  75 mcg Oral q AM   liver oil-zinc oxide   Topical TID   megestrol  400 mg Oral Daily   mupirocin ointment  1 Application Nasal BID   nystatin   Topical TID   phosphorus  250 mg Oral TID   rOPINIRole  3 mg Oral QHS   sodium bicarbonate  650 mg Oral TID   sodium chloride flush  3 mL Intravenous Q12H   sodium chloride flush  3 mL Intravenous Q12H   sodium hypochlorite   Irrigation BID   vancomycin  125 mg Oral QID   zinc sulfate  220 mg Oral Daily   Continuous Infusions:  sodium chloride     dextrose 5 % and 0.9% NaCl 100 mL/hr at 09/28/22 1436     LOS: 0 days    Shon Hale M.D on 09/28/2022 at 3:05 PM  Go to [email or web address] - for contact info  [facility] - Office  [phone]  If 7PM-7AM, please contact night-coverage [email or web address] 09/28/2022, 3:05 PM

## 2022-09-29 DIAGNOSIS — E162 Hypoglycemia, unspecified: Secondary | ICD-10-CM | POA: Diagnosis not present

## 2022-09-29 LAB — GLUCOSE, CAPILLARY
Glucose-Capillary: 106 mg/dL — ABNORMAL HIGH (ref 70–99)
Glucose-Capillary: 120 mg/dL — ABNORMAL HIGH (ref 70–99)
Glucose-Capillary: 170 mg/dL — ABNORMAL HIGH (ref 70–99)
Glucose-Capillary: 75 mg/dL (ref 70–99)
Glucose-Capillary: 82 mg/dL (ref 70–99)
Glucose-Capillary: 96 mg/dL (ref 70–99)

## 2022-09-29 NOTE — Progress Notes (Addendum)
PROGRESS NOTE     [name], is a 77 y.o. female, DOB - [birth year], ZOX:[id number]  Admit date - 09/26/2022   Admitting Physician Nazaret Chea [name], MD  Outpatient Primary MD for the patient is [name], FNP  LOS - 1  Chief Complaint  Patient presents with   Hypoglycemia        Brief Narrative:  77 y.o. female with medical history significant of  hypothyroidism, GERD, RLS, CKD 3B presenting from [facility] facility with episodes of recurrent hypoglycemia resulting in altered mentation admitted on 09/26/2022 and found to have C. difficile colitis and hypoglycemia    -Assessment and Plan: 1)Recurrent Hypoglycemia--multifactorial in the setting of poor oral intake and diarrhea  09/29/22 --Oral intake remains challenging-refuses to eat at times -Due to right upper extremity fracture she has been unable to feed himself----resulting in episodes of low sugars - proinsulin/insulin level pending -Started Megace for appetite stimulation on 09/28/22 -Accu-Cheks with glucose in the 70s despite continuous dextrose infusion -Eating [place] brought in by son--- -will decrease D5 solution to 50 ml/hr  from 100 ml/hr----if oral intake improves and sugars are stable may discontinue in a.m.   2) C. difficile colitis with diarrhea--oral Vanco as ordered -Patient was treated for C. difficile infection almost 4 weeks ago -She will n eed a slow oral Vanco taper at this time -Continue oral Vanco for now  3)Hyponatremia----most likely due to dehydration and poor oral intake -Hyponatremia resolved with hydration  4)Acute metabolic encephalopathy--- secondary to hypoglycemia and C. difficile infection 09/29/22 -Much improved, mentation pretty close to baseline according to patient's son who is at bedside   5)Hypothyroidism: TSH was 1.0 on 07/06/2022  -continue Synthyroid   6)RLS: Continue Requip   7)Moderate protein calorie moderation: Albumin of 2.6.  Nutritionist was consulted  and following   8)CKD stage IIIb:- renally adjust medications, avoid nephrotoxic agents / dehydration  / hypotension   9)Social/Ethics--plan of care and advanced directive discussed with patient, her son [name] and her brother [name] -Patient is a DNR/DNI without limitations to treatment otherwise  10)Rt Arm Picc Line In situ---POA -Indication unclear -Will remove PICC line as of 09/29/2022  11)Sacral decubitus ulcer--POA --Left heel pressure injury noted -Large left buttock decubitus ulcer deep to the bone- please see photos in epic see photos in epic -Wound care consult appreciated Left buttock Stage 4 pressure injury --POA -Left heel with healing stage 2 PI measuring 2.5cm x 2cm, scant serous exudate  -Wound care consult appreciated please see below  Wound / Incision (Open or Dehisced) 01/23/22 (IAD) Incontinence Associated Dermatitis Thigh Anterior;Right;Left (Active)  Date First Assessed/Time First Assessed: 01/23/22 0800   Wound Type: (IAD) Incontinence Associated Dermatitis  Location: Thigh  Location Orientation: Anterior;Right;Left  Present on Admission: No    Assessments 01/23/2022  8:33 AM 02/04/2022  7:25 AM  Dressing Type None None  Dressing Status None None  Site / Wound Assessment Pink;Red Pink;Red  Peri-wound Assessment Intact Intact  Drainage Amount None None     No associated orders.     Pressure Injury 09/26/22 Buttocks Left;Medial;Mid Stage 4 - Full thickness tissue loss with exposed bone, tendon or muscle. 8.5cm x 8cm x 0.5cm -  wound with tunneling/undermining edges at 12 o'clock to 6 o'clock positions 1cm. Wound base with thick whit (Active)  Date First Assessed/Time First Assessed: 09/26/22 1500   Location: Buttocks  Location Orientation: Left;Medial;Mid  Staging: Stage 4 - Full thickness tissue loss with exposed bone, tendon or muscle.  Wound Description (Comments): 8.5cm x 8cm x 0.5cm -...    Assessments 09/26/2022  4:00 PM 09/28/2022  5:38 AM   Dressing Type Foam - Lift dressing to assess site every shift Foam - Lift dressing to assess site every shift;Dakin's-soaked gauze  Dressing Dry;Intact;Changed Clean, Dry, Intact;Changed  State of Healing Non-healing --  Site / Wound Assessment Pink;Yellow --  % Wound base Red or Granulating 0% --  % Wound base Yellow/Fibrinous Exudate 40% --  % Wound base Black/Eschar 0% --  % Wound base Other/Granulation Tissue (Comment) 60% --  Peri-wound Assessment Erythema (blanchable) --  Wound Length (cm) 8.5 cm --  Wound Width (cm) 8 cm --  Wound Depth (cm) 1 cm --  Wound Surface Area (cm^2) 68 cm^2 --  Wound Volume (cm^3) 68 cm^3 --  Tunneling (cm) 1 --  Undermining (cm) 1 --  Margins Epibole (rolled edges) --  Drainage Amount Moderate --  Drainage Description Purulent --  Treatment Cleansed;Packing (Saline gauze);Packing (Dry gauze) --     No associated orders.     Pressure Injury 09/26/22 Heel Left Stage 2 -  Partial thickness loss of dermis presenting as a shallow open injury with a red, pink wound bed without slough. 2.5cm x 2cm x 0 cm. Wound bed pink with some yellow at very center. Periwound area blanchable wi (Active)  Date First Assessed/Time First Assessed: 09/26/22 1500   Location: Heel  Location Orientation: Left  Staging: Stage 2 -  Partial thickness loss of dermis presenting as a shallow open injury with a red, pink wound bed without slough.  Wound Description...    Assessments 09/26/2022  4:00 PM 09/28/2022  5:38 AM  Dressing Type Foam - Lift dressing to assess site every shift Gauze (Comment);Impregnated gauze (petrolatum)  Dressing Clean, Dry, Intact;Changed Clean, Dry, Intact;Changed  Dressing Change Frequency PRN --  State of Healing Early/partial granulation --  Site / Wound Assessment Clean;Dry;Painful;Pink --  Peri-wound Assessment Other (Comment) --  Wound Length (cm) 2.5 cm --  Wound Width (cm) 2.5 cm --  Wound Depth (cm) 0 cm --  Wound Surface Area (cm^2) 6.25 cm^2  --  Wound Volume (cm^3) 0 cm^3 --  Tunneling (cm) no --  Margins Unattached edges (unapproximated) --  Drainage Amount Scant --  Drainage Description Serous --     No associated orders.   Status is: Inpatient   Disposition: The patient is from: SNF              Anticipated d/c is to: SNF              Anticipated d/c date is: 3 days              Patient currently is not medically stable to d/c. Barriers: Not Clinically Stable-   Code Status :  -  Code Status: DNR   Family Communication:  -Discussed with patient and brother [name] and patient's son [name]  DVT Prophylaxis  :   - SCDs   heparin injection 5,000 Units Start: 09/26/22 2200 SCDs Start: 09/26/22 1558 Place TED hose Start: 09/26/22 1558   Lab Results  Component Value Date   PLT 254 09/26/2022   Inpatient Medications  Scheduled Meds:  ascorbic acid  500 mg Oral Daily   Chlorhexidine Gluconate Cloth  6 each Topical Q0600   citalopram  40 mg Oral Daily   feeding supplement  237 mL Oral TID   heparin  5,000 Units Subcutaneous Q8H   leptospermum manuka honey  1 Application Topical Daily   levothyroxine  75 mcg Oral q AM   liver oil-zinc oxide   Topical TID   megestrol  400 mg Oral Daily   mupirocin ointment  1 Application Nasal BID   nystatin   Topical TID   rOPINIRole  3 mg Oral QHS   sodium bicarbonate  650 mg Oral TID   sodium chloride flush  3 mL Intravenous Q12H   sodium chloride flush  3 mL Intravenous Q12H   sodium hypochlorite   Irrigation BID   vancomycin  125 mg Oral QID   zinc sulfate  220 mg Oral Daily   Continuous Infusions:  sodium chloride     dextrose 5 % and 0.9% NaCl 100 mL/hr at 09/29/22 1206   PRN Meds:.sodium chloride, acetaminophen **OR** acetaminophen, hydrOXYzine, ondansetron **OR** ondansetron (ZOFRAN) IV, sodium chloride flush, traZODone   Anti-infectives (From admission, onward)    Start     Dose/Rate Route Frequency Ordered Stop   09/26/22 2215  vancomycin (VANCOCIN)  capsule 125 mg        125 mg Oral 4 times daily 09/26/22 2129 10/06/22 2159       Subjective: [name] Puccio today has no fevers,  No chest pain,   -Oral intake remains challenging--son brought her some food from [employer] -Patient's son [name] and family at bedside -Frequency and volume and consistency of stools improving slowly--liquid stool in Flexi-Seal -Patient continues to refuse to eat from time to time -Accu-Cheks with glucose in the 70s despite continuous dextrose infusion -No emesis  Objective: Vitals:   09/28/22 1451 09/28/22 1956 09/29/22 0452 09/29/22 1021  BP: 124/70 130/66 139/74 120/76  Pulse: (!) 53 (!) 57 (!) 56 61  Resp: 18 19 20 18   Temp: 98.5 F (36.9 C) 98.4 F (36.9 C) 97.7 F (36.5 C)   TempSrc: Axillary Oral Oral   SpO2: 100% 98% 100% 100%  Weight:      Height:        Intake/Output Summary (Last 24 hours) at 09/29/2022 1232 Last data filed at 09/29/2022 1100 Gross per 24 hour  Intake 120 ml  Output 500 ml  Net -380 ml   Filed Weights   09/26/22 1001  Weight: 67.6 kg   Physical Exam  Gen:- Awake Alert, frail and chronically ill-appearing, HEENT:- [place].AT, No sclera icterus Neck-Supple Neck,No JVD,.  Lungs-  CTAB , fair symmetrical air movement CV- S1, S2 normal, regular  Abd-  +ve B.Sounds, Abd Soft, No tenderness,    Extremity/Skin:- No  edema, pedal pulses present , left buttock and left heel decubitus ulcers as noted--please see photos in epic Psych-affect is appropriate, oriented x3, some cognitive and memory deficits noted Neuro-generalized weakness no new focal deficits, no tremors Rectum----Flexi-Seal with liquid stool MSK-right arm PICC line--to be removed on 09/29/2022  Data Reviewed: I have personally reviewed following labs and imaging studies  CBC: Recent Labs  Lab 09/24/22 0017 09/26/22 1033  WBC 9.4 9.2  NEUTROABS  --  7.8*  HGB 10.3* 9.4*  HCT 32.6* 30.5*  MCV 97.9 99.0  PLT 316 254   Basic Metabolic Panel: Recent  Labs  Lab 09/24/22 0017 09/26/22 1033 09/27/22 0406 09/28/22 0436  NA 136 134* 137 140  K 4.1 4.0 3.2* 3.5  CL 96* 96* 101 109  CO2 32 30 30 27   GLUCOSE 101* 147* 104* 165*  BUN 21 25* 24* 22  CREATININE 1.42* 1.35* 1.23* 1.12*  CALCIUM 8.1* 7.9* 7.9* 7.5*  PHOS  --   --  2.5  --    GFR: Estimated Creatinine Clearance: 38.5 mL/min (A) (by C-G formula based on SCr of 1.12 mg/dL (H)). Liver Function Tests: Recent Labs  Lab 09/24/22 0017 09/26/22 1033 09/27/22 0406  AST 23 21  --   ALT 14 14  --   ALKPHOS 175* 138*  --   BILITOT 0.7 0.6  --   PROT 6.3* 5.3*  --   ALBUMIN 2.6* 2.3* 2.1*   Cardiac Enzymes: Recent Labs  Lab 09/26/22 1033  CKTOTAL 44   Recent Results (from the past 240 hour(s))  MRSA Next Gen by PCR, Nasal     Status: Abnormal   Collection Time: 09/26/22  5:25 PM   Specimen: Nasal Mucosa; Nasal Swab  Result Value Ref Range Status   MRSA by PCR Next Gen DETECTED (A) NOT DETECTED Final    Comment: RESULT CALLED TO, READ BACK BY AND VERIFIED WITH: A. BAILEY AT 2215 ON 05.02.24 BY ADGER J         The GeneXpert MRSA Assay (FDA approved for NASAL specimens only), is one component of a comprehensive MRSA colonization surveillance program. It is not intended to diagnose MRSA infection nor to guide or monitor treatment for MRSA infections. Performed at [facility], [address] [place], [place] [address]   C Difficile Quick Screen w PCR reflex     Status: Abnormal   Collection Time: 09/26/22  7:04 PM   Specimen: STOOL  Result Value Ref Range Status   C Diff antigen POSITIVE (A) NEGATIVE Final    Comment: CRITICAL RESULT CALLED TO, READ BACK BY AND VERIFIED WITH: A.BALIEY AT 2116 ON 05.02.2024 BY ADGER J     C Diff toxin POSITIVE (A) NEGATIVE Final    Comment: CRITICAL RESULT CALLED TO, READ BACK BY AND VERIFIED WITH: A. BALIEY AT 2116 ON 05.02.22024 BY ADGER J     C Diff interpretation Toxin producing C. difficile detected.  Final    Comment:  CRITICAL RESULT CALLED TO, READ BACK BY AND VERIFIED WITH: A. BAILEY AT 2116 ON [id number] BY ADGER J Performed at [facility], [address] [place], [place] [address]     Radiology Studies: No results found.  Scheduled Meds:  ascorbic acid  500 mg Oral Daily   Chlorhexidine Gluconate Cloth  6 each Topical Q0600   citalopram  40 mg Oral Daily   feeding supplement  237 mL Oral TID   heparin  5,000 Units Subcutaneous Q8H   leptospermum manuka honey  1 Application Topical Daily   levothyroxine  75 mcg Oral q AM   liver oil-zinc oxide   Topical TID   megestrol  400 mg Oral Daily   mupirocin ointment  1 Application Nasal BID   nystatin   Topical TID   rOPINIRole  3 mg Oral QHS   sodium bicarbonate  650 mg Oral TID   sodium chloride flush  3 mL Intravenous Q12H   sodium chloride flush  3 mL Intravenous Q12H   sodium hypochlorite   Irrigation BID   vancomycin  125 mg Oral QID   zinc sulfate  220 mg Oral Daily   Continuous Infusions:  sodium chloride     dextrose 5 % and 0.9% NaCl 100 mL/hr at 09/29/22 1206    LOS: 1 day   Shon Hale M.D on 09/29/2022 at 12:32 PM  Go to [email or web address] - for contact info  [facility] - Office  [phone]  If 7PM-7AM, please contact night-coverage [email or web address] 09/29/2022, 12:32  PM  

## 2022-09-29 NOTE — Progress Notes (Addendum)
  Interdisciplinary Goals of Care Family Meeting   Date carried out: 09/29/2022  Location of the meeting: Bedside  Member's involved: Physician and Family Member or next of kin  Durable Power of Attorney or acting medical decision maker: Son [name]    Discussion: We discussed goals of care for [employer] .   --plan of care and advanced directive discussed with patient, her son [name] and her brother [name] -Patient is a DNR/DNI without limitations to treatment otherwise  Code status:   Code Status: DNR   Disposition: Continue current acute care  Time spent for the meeting: 11   Shon Hale, MD  09/29/2022, 12:47 PM

## 2022-09-30 ENCOUNTER — Inpatient Hospital Stay (HOSPITAL_COMMUNITY): Payer: Medicare Other

## 2022-09-30 ENCOUNTER — Encounter (HOSPITAL_COMMUNITY): Payer: Self-pay | Admitting: Family Medicine

## 2022-09-30 DIAGNOSIS — E162 Hypoglycemia, unspecified: Secondary | ICD-10-CM | POA: Diagnosis not present

## 2022-09-30 DIAGNOSIS — R627 Adult failure to thrive: Secondary | ICD-10-CM

## 2022-09-30 DIAGNOSIS — Z515 Encounter for palliative care: Secondary | ICD-10-CM

## 2022-09-30 DIAGNOSIS — F03918 Unspecified dementia, unspecified severity, with other behavioral disturbance: Secondary | ICD-10-CM | POA: Diagnosis present

## 2022-09-30 DIAGNOSIS — F0390 Unspecified dementia without behavioral disturbance: Secondary | ICD-10-CM | POA: Diagnosis present

## 2022-09-30 DIAGNOSIS — Z7189 Other specified counseling: Secondary | ICD-10-CM

## 2022-09-30 LAB — RENAL FUNCTION PANEL
Albumin: 2 g/dL — ABNORMAL LOW (ref 3.5–5.0)
Anion gap: 6 (ref 5–15)
BUN: 19 mg/dL (ref 8–23)
CO2: 25 mmol/L (ref 22–32)
Calcium: 7.7 mg/dL — ABNORMAL LOW (ref 8.9–10.3)
Chloride: 107 mmol/L (ref 98–111)
Creatinine, Ser: 1.23 mg/dL — ABNORMAL HIGH (ref 0.44–1.00)
GFR, Estimated: 46 mL/min — ABNORMAL LOW (ref >60)
Glucose, Bld: 80 mg/dL (ref 70–99)
Phosphorus: 1.8 mg/dL — ABNORMAL LOW (ref 2.5–4.6)
Potassium: 3.5 mmol/L (ref 3.5–5.1)
Sodium: 138 mmol/L (ref 135–145)

## 2022-09-30 LAB — HEMOGLOBIN A1C
Hgb A1c MFr Bld: 4.5 % — ABNORMAL LOW (ref 4.8–5.6)
Mean Plasma Glucose: 82.45 mg/dL

## 2022-09-30 LAB — GLUCOSE, CAPILLARY
Glucose-Capillary: 77 mg/dL (ref 70–99)
Glucose-Capillary: 77 mg/dL (ref 70–99)
Glucose-Capillary: 72 mg/dL (ref 70–99)
Glucose-Capillary: 76 mg/dL (ref 70–99)

## 2022-09-30 MED ORDER — DAKINS (1/4 STRENGTH) 0.125 % EX SOLN
Freq: Two times a day (BID) | CUTANEOUS | Status: DC
  Filled 2022-09-30: qty 473

## 2022-09-30 MED ORDER — FERROUS SULFATE 325 (65 FE) MG PO TABS
325.0000 mg | ORAL_TABLET | Freq: Every day | ORAL | Status: DC
  Administered 2022-09-30 – 2022-10-01 (×2): 325 mg via ORAL
  Filled 2022-09-30 (×2): qty 1

## 2022-09-30 NOTE — Consult Note (Addendum)
Consultation Note Date: 09/30/2022   Patient Name: [name]  DOB: Apr 15, 1946  MRN: [id number]  Age / Sex: 77 y.o., female  PCP: [name], FNP Referring Physician: Shon Hale, MD  Reason for Consultation: Establishing goals of care  HPI/Patient Profile: 77 y.o. female  with past medical history of (although there is no listed diagnosis of dementia, it seems that [name] indeed has memory loss), she has been at [facility]/ long-term care, unable to feed self due to right upper extremity fracture, recent hospital admission for rhabdo myelosis and metabolic encephalopathy, chronic diarrhea, sacral pressure ulcer, HTN/HLD, anxiety/depression, COPD, GERD, hypothyroid, osteoporosis, thyroid disorder, history of abdominal hysterectomy, cholecystectomy 2015, hip fracture in 1990, laparotomy August 2023 with lysis of adhesion, thoracentesis August 2023, admitted on 09/26/2022 with recurrent hypoglycemia multi factorial in setting of poor by mouth intake and diarrhea continues to decline foods at times with C. difficile colitis treated almost 4 weeks ago and now on a slow oral Vanco taper.   Clinical Assessment and Goals of Care: I have reviewed medical records including EPIC notes, labs and imaging, received report from RN, assessed the patient.  [name] is sitting up in bed.  She is unaware that I am present and seems to be holding a conversation with someone who was not present.  She gives a start when she realizes that I am in the room.  She will make and somewhat keep eye contact.  She is able to tell me her name, but states that we are in a building owned by [employer].  She seems to think that her son, [name], is present along with someone else who she tells me is having a birthday.  She leans over and asks them how old they are, and she seems to hear them respond and tells me 47 years  old.  Call to son, [name], no answer, left somewhat generic voicemail message. Call to daughter-in-law, [name], no answer, left somewhat generic voicemail message.  Second call to son, [name].  No answer, left somewhat generic voicemail message as no specific person listed on voicemail greeting.  Call to brother, [name] at [phone].   We discussed a brief life review of the patient.  Mr. [name] tells me that his sister is a widow.  Her husband died about 10 years ago.  She has a daughter, [name], who has lived in [name]'s home her whole life.  Mr. [name] shares that [name] (aged 6) has OCD, and has never worked and is "dependent" upon [name] "for everything".  [name] still has her own home with [name] residing there.  Mr. [name] states that his wife is assisting [name] with getting food at this point.  Mr. [name] tells me that [name] has never been diagnosed with dementia that he is aware of.  He shares that things started to change for her after "stomach surgery" August 2023.  He shares that she has been back-and-forth for rehab at [facility], actually spending  several months at her son Saunders's home.  [name] had been independent with IADLs prior to her stomach surgery in August 2023.  After that [name] started to assist with managing her finances.  Mr. [name] shares that there is no family history of memory loss.  He is in agreement that he does not feel that his sister would ever be able to live on her own again.  We then focused on their current illness. The natural disease trajectory and expectations at EOL were discussed.  Mr. [name] states that he and his wife visited [name] yesterday.  He shares that when they arrived she was in the bed with the covers over her head crying.  She told them she was trying to put on her shirt.  He states that he was told in the past that her ammonia was high and this may be the cause of some of her confusion.  We  did talk in detail about her current labs including, but not limited to ammonia and white blood cells.  Addendum: We also talk about her sacral decubitus wound which is quite severe.  Mr. [name] states that he is aware of this wound and has seen it.   We talked about her arm fracture and poor by mouth intake.  I share that with this arm fracture she is not feeding herself and not always eating for others.  I also share that we have to eat to live.  Mr. [name] states that he has not heard any discussions of hospice care while at [facility].  Discussed the importance of continued conversation with family and the medical providers regarding overall plan of care and treatment options, ensuring decisions are within the context of the patient's values and GOCs.  Questions and concerns were addressed.   The family was encouraged to call with questions or concerns.  PMT will continue to support holistically.  Conference with attending, bedside nursing staff, transition of care team related to patient condition, needs, goals of care, disposition.   HCPOA NEXT OF KIN -son, [name].    SUMMARY OF RECOMMENDATIONS   At this point continue to treat the treatable but no CPR or intubation Time for outcomes.   Anticipate need for long-term care. Would benefit from hospice care if no meaningful improvements   Code Status/Advance Care Planning: DNR  Symptom Management:  Per hospitalist, no additional needs at this time.  Palliative Prophylaxis:  Frequent Pain Assessment, Oral Care, and Palliative Wound Care  Additional Recommendations (Limitations, Scope, Preferences): At this point continue to treat the treatable but no CPR or intubation  Psycho-social/Spiritual:  Desire for further Chaplaincy support:no Additional Recommendations: Caregiving  Support/Resources and Education on Hospice  Prognosis:  Unable to determine, based on outcomes.  6 months or less would not be surprising based on  chronic illness burden, 3 hospital stays and 1 ED visit in the last 6 months, advancing illness.  Discharge Planning: Anticipate return to memory care unit at [facility], would benefit from hospice care.        Primary Diagnoses: Present on Admission:  Hypoglycemia  Acquired hypothyroidism  Atrial fibrillation (HCC)  COPD (chronic obstructive pulmonary disease) (HCC)  Decubitus ulcer  Essential hypertension  Failure to thrive in adult  Malnutrition of moderate degree  Stage 3b chronic kidney disease (CKD) (HCC)   I have reviewed the medical record, interviewed the patient and family, and examined the patient. The following aspects are pertinent.  Past Medical History:  Diagnosis  Date   Allergy    Bradycardia    Bronchitis, chronic (HCC)    Chronic anxiety    Complication of anesthesia    hard to wake up   COPD (chronic obstructive pulmonary disease) (HCC)    Depression    Esophageal reflux    Headache(784.0)    Hyperlipidemia    Hypertension    Hypothyroidism    Obesity    Osteoporosis    Overactive bladder    PVC's (premature ventricular contractions)    Thyroid disease    Vitamin D deficiency disease    Social History   Socioeconomic History   Marital status: Single    Spouse name: Not on file   Number of children: 2   Years of education: Not on file   Highest education level: Not on file  Occupational History   Not on file  Tobacco Use   Smoking status: Never   Smokeless tobacco: Never  Vaping Use   Vaping Use: Never used  Substance and Sexual Activity   Alcohol use: No   Drug use: No   Sexual activity: Not on file  Other Topics Concern   Not on file  Social History Narrative   Not on file   Social Determinants of Health   Financial Resource Strain: Low Risk  (11/28/2021)   Overall Financial Resource Strain (CARDIA)    Difficulty of Paying Living Expenses: Not hard at all  Food Insecurity: No Food Insecurity (09/26/2022)   Hunger Vital Sign     Worried About Running Out of Food in the Last Year: Never true    Ran Out of Food in the Last Year: Never true  Transportation Needs: No Transportation Needs (09/26/2022)   PRAPARE - Administrator, Civil Service (Medical): No    Lack of Transportation (Non-Medical): No  Physical Activity: Inactive (11/28/2021)   Exercise Vital Sign    Days of Exercise per Week: 0 days    Minutes of Exercise per Session: 0 min  Stress: Stress Concern Present (11/28/2021)   [employer] of Occupational Health - Occupational Stress Questionnaire    Feeling of Stress : To some extent  Social Connections: Socially Integrated (11/28/2021)   Social Connection and Isolation Panel [NHANES]    Frequency of Communication with Friends and Family: More than three times a week    Frequency of Social Gatherings with Friends and Family: More than three times a week    Attends Religious Services: More than 4 times per year    Active Member of [employer] or Organizations: Yes    Attends Engineer, structural: More than 4 times per year    Marital Status: Married   Family History  Problem Relation Age of Onset   Arthritis Other    Heart disease Other    Cancer Other    Diabetes Other    OCD Other    Scheduled Meds:  ascorbic acid  500 mg Oral Daily   Chlorhexidine Gluconate Cloth  6 each Topical Q0600   citalopram  40 mg Oral Daily   feeding supplement  237 mL Oral TID   heparin  5,000 Units Subcutaneous Q8H   leptospermum manuka honey  1 Application Topical Daily   levothyroxine  75 mcg Oral q AM   liver oil-zinc oxide   Topical TID   megestrol  400 mg Oral Daily   mupirocin ointment  1 Application Nasal BID   nystatin   Topical TID   rOPINIRole  3  mg Oral QHS   sodium bicarbonate  650 mg Oral TID   sodium chloride flush  3 mL Intravenous Q12H   sodium chloride flush  3 mL Intravenous Q12H   vancomycin  125 mg Oral QID   zinc sulfate  220 mg Oral Daily   Continuous Infusions:  sodium  chloride     dextrose 5 % and 0.9% NaCl 10 mL/hr at 09/30/22 1000   PRN Meds:.sodium chloride, acetaminophen **OR** acetaminophen, hydrOXYzine, ondansetron **OR** ondansetron (ZOFRAN) IV, sodium chloride flush, traZODone Medications Prior to Admission:  Prior to Admission medications   Medication Sig Start Date End Date Taking? Authorizing Provider  Amino Acids-Protein Hydrolys (PRO-STAT) LIQD Take 30 mLs by mouth 2 (two) times daily.   Yes [provider]  ascorbic acid (VITAMIN C) 500 MG tablet Take 500 mg by mouth 2 (two) times daily.   Yes [provider]  Cholecalciferol (VITAMIN D3) 50 MCG (2000 UT) CAPS Take 4,000 Units by mouth every morning.   Yes [provider]  cholestyramine (QUESTRAN) 4 g packet Take 1 packet (4 g total) by mouth 3 (three) times daily with meals. 05/30/22  Yes Martin, Mary-Margaret, FNP  citalopram (CELEXA) 40 MG tablet Take 1 tablet (40 mg total) by mouth daily. 05/06/22  Yes Daphine Deutscher, Mary-Margaret, FNP  clotrimazole-betamethasone (LOTRISONE) cream Apply 1 Application topically daily. Apply to perineum/inner buttocks topically every day shift for rash for 7 days.   Yes [provider]  collagenase (SANTYL) 250 UNIT/GM ointment Apply 1 Application topically daily. Apply to wound bed topically every day shift for healing.   Yes [provider]  dicyclomine (BENTYL) 10 MG capsule Take 1 capsule (10 mg total) by mouth every 8 (eight) hours as needed for spasms. TAKE ONE CAPSULE BY MOUTH THREE TIMES DAILY BEFORE meals Patient taking differently: Take 10 mg by mouth 3 (three) times daily before meals. TAKE ONE CAPSULE BY MOUTH THREE TIMES DAILY BEFORE meals 09/04/22  Yes Adhikari, Willia Craze, MD  ferrous sulfate 325 (65 FE) MG tablet Take 325 mg by mouth daily with breakfast.   Yes [provider]  levothyroxine (SYNTHROID) 75 MCG tablet Take 1 tablet (75 mcg total) by mouth daily. 05/07/22  Yes Daphine Deutscher, Mary-Margaret, FNP   loperamide (IMODIUM A-D) 2 MG tablet Take 2 mg by mouth every 6 (six) hours as needed for diarrhea or loose stools.   Yes [provider]  Magnesium Oxide 400 MG CAPS Take 1 capsule (400 mg total) by mouth daily. 09/04/22  Yes Burnadette Pop, MD  Multiple Vitamin (MULTIVITAMIN) tablet Take 1 tablet by mouth every morning.   Yes [provider]  nutrition supplement, JUVEN, (JUVEN) PACK Take 1 packet by mouth 2 (two) times daily between meals.   Yes [provider]  OxyCODONE HCl, Abuse Deter, (OXAYDO) 5 MG TABA Take 5 mg by mouth daily as needed (severe pain).   Yes [provider]  rOPINIRole (REQUIP XL) 2 MG 24 hr tablet Take 2 mg by mouth at bedtime.   Yes [provider]  sodium bicarbonate 650 MG tablet Take 1 tablet (650 mg total) by mouth 3 (three) times daily. 09/04/22 10/04/22 Yes Burnadette Pop, MD  traMADol HCl 100 MG TABS Take 100 mg by mouth 2 (two) times daily.   Yes [provider]  Zinc 220 (50 Zn) MG CAPS Take 220 mg by mouth every morning.   Yes [provider]  leptospermum manuka honey (MEDIHONEY) PSTE paste Apply 1 Application topically daily. Patient  not taking: Reported on 09/27/2022 09/05/22   Burnadette Pop, MD  loperamide (IMODIUM) 2 MG capsule Take 1 capsule (2 mg total) by mouth every 6 (six) hours as needed for diarrhea or loose stools. Patient not taking: Reported on 09/27/2022 09/04/22   Burnadette Pop, MD  nystatin (MYCOSTATIN/NYSTOP) powder Apply topically 3 (three) times daily. On groins Patient not taking: Reported on 09/27/2022 09/04/22   Burnadette Pop, MD  potassium chloride (KLOR-CON) 20 MEQ packet Take 40 mEq by mouth daily for 7 days. Patient not taking: Reported on 09/27/2022 09/05/22 09/12/22  Burnadette Pop, MD  potassium chloride (MICRO-K) 10 MEQ CR capsule Take 1 capsule (10 mEq total) by mouth 2 (two) times daily. Patient not taking: Reported on 09/27/2022 09/12/22   Burnadette Pop, MD  rOPINIRole  (REQUIP) 3 MG tablet Take 1 tablet (3 mg total) by mouth at bedtime. Patient not taking: Reported on 09/27/2022 05/06/22   [name], FNP   Allergies  Allergen Reactions   Celebrex [Celecoxib] Swelling   Keflet [Cephalexin] Swelling   Penicillins Hives and Swelling   Sulfa Antibiotics Swelling   Kenalog [Triamcinolone Acetonide]     unknown   Latex Other (See Comments)    No reaction listed on MAR   Lisinopril Other (See Comments)    unknown   Mobic [Meloxicam]     SWELLING   Penicillin G Other (See Comments)    No reaction listed on MAR   Review of Systems  Unable to perform ROS: Dementia    Physical Exam Vitals and nursing note reviewed.  Constitutional:      General: She is not in acute distress.    Appearance: She is ill-appearing.  HENT:     Mouth/Throat:     Mouth: Mucous membranes are moist.  Cardiovascular:     Rate and Rhythm: Normal rate.  Pulmonary:     Effort: Pulmonary effort is normal. No respiratory distress.  Skin:    General: Skin is warm and dry.  Neurological:     Mental Status: She is alert.     Comments: Oriented to self only, known dementia     Vital Signs: BP 138/72 (BP Location: Left Arm)   Pulse (!) 56   Temp 98 F (36.7 C) (Oral)   Resp 18   Ht 5\' 2"  (1.575 m)   Wt 67.6 kg   SpO2 100%   BMI 27.25 kg/m  Pain Scale: 0-10   Pain Score: 4    SpO2: SpO2: 100 % O2 Device:SpO2: 100 % O2 Flow Rate: .   IO: Intake/output summary:  Intake/Output Summary (Last 24 hours) at 09/30/2022 1231 Last data filed at 09/30/2022 0900 Gross per 24 hour  Intake 6232.18 ml  Output --  Net 6232.18 ml    LBM: Last BM Date : 09/29/22 Baseline Weight: Weight: 67.6 kg Most recent weight: Weight: 67.6 kg     Palliative Assessment/Data:     Time In: 1030 Time Out: 1145 Time Total: 75 minutes Greater than 50%  of this time was spent counseling and coordinating care related to the above assessment and plan.  Signed by: Katheran Awe,  NP   Please contact Palliative Medicine Team phone at [phone] for questions and concerns.  For individual provider: See Loretha Stapler

## 2022-09-30 NOTE — Progress Notes (Addendum)
PROGRESS NOTE     [name], is a 77 y.o. female, DOB - 12-Feb-1946, WUJ:[id number]  Admit date - 09/26/2022   Admitting Physician Cindy Pitter [name], MD  Outpatient Primary MD for the patient is Cindy Pierini, [name]  LOS - 2  Chief Complaint  Patient presents with   Hypoglycemia        Brief Narrative:  77 y.o. female with medical history significant of  hypothyroidism, GERD, RLS, CKD 3B presenting from [facility] SNF facility with episodes of recurrent hypoglycemia resulting in altered mentation admitted on 09/26/2022 and found to have C. difficile colitis and hypoglycemia    -Assessment and Plan: 1)Recurrent Hypoglycemia--multifactorial in the setting of poor oral intake and diarrhea  09/30/22 --Oral intake remains challenging-refuses to eat at times -Due to right upper extremity fracture she has been unable to feed himself----resulting in episodes of low sugars - proinsulin/insulin level pending -Started Megace for appetite stimulation on 09/28/22 -Check A1c -Will Stop D5 on 09/30/22 ---if oral intake improves and sugars are stable off iv Dextrose--- will consider discharge back to SNF   2) C. difficile colitis with diarrhea--oral Vanco as ordered -Patient was treated for C. difficile infection almost 4 weeks ago -She will need a slow oral Vanco taper at this time -Continue oral Vanco for now  3)Hyponatremia----most likely due to dehydration and poor oral intake -Hyponatremia resolved with hydration  4)Acute metabolic encephalopathy--- secondary to hypoglycemia and C. difficile infection 09/30/22 -Much improved, mentation pretty close to baseline according to patient's son  -at Baseline patient has cognitive and memory deficits due to underlying dementia  5)Hypothyroidism: TSH was 1.0 on 07/06/2022  -continue Synthyroid   6)RLS: Continue Requip   7)Moderate protein calorie moderation: Albumin of 2.6.  Nutritionist was consulted and following   8)CKD stage IIIb:-  renally adjust medications, avoid nephrotoxic agents / dehydration  / hypotension   9)Social/Ethics--plan of care and advanced directive discussed with patient, her son [name] and her brother [name] -Patient is a DNR/DNI without limitations to treatment otherwise  10)Rt Arm Picc Line In situ---POA -Indication unclear -Removed PICC line on 09/29/2022  11)Dementia---Pt has Obvious cognitive and memory deficits. Patient has difficulty understanding complex issues ans has difficulty making complex decisions [name] is unable to understand (without significant language barrier) her current medical diagnosis, patient is Unable to appropriately verbalize understanding of proposed treatment options including option of no treatment, unable to understand the consequences/risk Versus benefit of each treatment option, alternatives as well as the option of No treatment. Based on my evaluation [name]  appears to Not have the Capacity to make decisions and is unable to give informed consent about her medical care.  A surrogate decision-maker is required as [name]  as she appears Not to have capacity to make her own decisions and/or give informed consent regarding her medical care Her son son  [name] is her HCPOA/surrogate decision maker  12) chronic anemia--multi factorial in the setting of poor oral intake, -Prior workup consistent with iron deficiency, B12 was WNL -No acute bleeding concerns - Hgb may have dropped due to hydration with dextrose solution  13)Sacral Decubitus Ulcer--POA --Left heel pressure injury noted -Large left buttock decubitus ulcer deep to the bone- please see photos in epic see photos in epic -Wound care consult appreciated Left buttock Stage 4 pressure injury --POA -Left heel with healing stage 2 PI measuring 2.5cm x 2cm, scant serous exudate  -Wound care consult appreciated please see below  Wound / Incision (Open or Dehisced) 01/23/22  (IAD) Incontinence Associated Dermatitis Thigh Anterior;Right;Left (Active)  Date First Assessed/Time First Assessed: 01/23/22 0800   Wound Type: (IAD) Incontinence Associated Dermatitis  Location: Thigh  Location Orientation: Anterior;Right;Left  Present on Admission: No    Assessments 01/23/2022  8:33 AM 02/04/2022  7:25 AM  Dressing Type None None  Dressing Status None None  Site / Wound Assessment Pink;Red Pink;Red  Peri-wound Assessment Intact Intact  Drainage Amount None None     No associated orders.     Pressure Injury 09/26/22 Buttocks Left;Medial;Mid Stage 4 - Full thickness tissue loss with exposed bone, tendon or muscle. 8.5cm x 8cm x 0.5cm -  wound with tunneling/undermining edges at 12 o'clock to 6 o'clock positions 1cm. Wound base with thick whit (Active)  Date First Assessed/Time First Assessed: 09/26/22 1500   Location: Buttocks  Location Orientation: Left;Medial;Mid  Staging: Stage 4 - Full thickness tissue loss with exposed bone, tendon or muscle.  Wound Description (Comments): 8.5cm x 8cm x 0.5cm -...    Assessments 09/26/2022  4:00 PM 09/29/2022 10:50 PM  Dressing Type Foam - Lift dressing to assess site every shift Foam - Lift dressing to assess site every shift  Dressing Dry;Intact;Changed Changed  State of Healing Non-healing --  Site / Wound Assessment Pink;Yellow --  % Wound base Red or Granulating 0% --  % Wound base Yellow/Fibrinous Exudate 40% --  % Wound base Black/Eschar 0% --  % Wound base Other/Granulation Tissue (Comment) 60% --  Peri-wound Assessment Erythema (blanchable) --  Wound Length (cm) 8.5 cm --  Wound Width (cm) 8 cm --  Wound Depth (cm) 1 cm --  Wound Surface Area (cm^2) 68 cm^2 --  Wound Volume (cm^3) 68 cm^3 --  Tunneling (cm) 1 --  Undermining (cm) 1 --  Margins Epibole (rolled edges) --  Drainage Amount Moderate --  Drainage Description Purulent --  Treatment Cleansed;Packing (Saline gauze);Packing (Dry gauze) --     No associated  orders.     Pressure Injury 09/26/22 Heel Left Stage 2 -  Partial thickness loss of dermis presenting as a shallow open injury with a red, pink wound bed without slough. 2.5cm x 2cm x 0 cm. Wound bed pink with some yellow at very center. Periwound area blanchable wi (Active)  Date First Assessed/Time First Assessed: 09/26/22 1500   Location: Heel  Location Orientation: Left  Staging: Stage 2 -  Partial thickness loss of dermis presenting as a shallow open injury with a red, pink wound bed without slough.  Wound Description...    Assessments 09/26/2022  4:00 PM 09/29/2022 10:50 PM  Dressing Type Foam - Lift dressing to assess site every shift Gauze (Comment)  Dressing Clean, Dry, Intact;Changed Clean, Dry, Intact  Dressing Change Frequency PRN --  State of Healing Early/partial granulation --  Site / Wound Assessment Clean;Dry;Painful;Pink Dressing in place / Unable to assess  Peri-wound Assessment Other (Comment) --  Wound Length (cm) 2.5 cm --  Wound Width (cm) 2.5 cm --  Wound Depth (cm) 0 cm --  Wound Surface Area (cm^2) 6.25 cm^2 --  Wound Volume (cm^3) 0 cm^3 --  Tunneling (cm) no --  Margins Unattached edges (unapproximated) --  Drainage Amount Scant --  Drainage Description Serous --     No associated orders.   Status is: Inpatient   Disposition: The patient is from: SNF              Anticipated d/c is to: SNF  Anticipated d/c date is: 3 days              Patient currently is not medically stable to d/c. Barriers: Not Clinically Stable-   Code Status :  -  Code Status: DNR   Family Communication:  -Discussed with patient and brother [name] and patient's son [name]  DVT Prophylaxis  :   - SCDs   heparin injection 5,000 Units Start: 09/26/22 2200 SCDs Start: 09/26/22 1558 Place TED hose Start: 09/26/22 1558  Lab Results  Component Value Date   PLT 254 09/26/2022   Inpatient Medications  Scheduled Meds:  ascorbic acid  500 mg Oral Daily   Chlorhexidine  Gluconate Cloth  6 each Topical Q0600   citalopram  40 mg Oral Daily   feeding supplement  237 mL Oral TID   heparin  5,000 Units Subcutaneous Q8H   leptospermum manuka honey  1 Application Topical Daily   levothyroxine  75 mcg Oral q AM   liver oil-zinc oxide   Topical TID   megestrol  400 mg Oral Daily   mupirocin ointment  1 Application Nasal BID   nystatin   Topical TID   rOPINIRole  3 mg Oral QHS   sodium bicarbonate  650 mg Oral TID   sodium chloride flush  3 mL Intravenous Q12H   sodium chloride flush  3 mL Intravenous Q12H   vancomycin  125 mg Oral QID   zinc sulfate  220 mg Oral Daily   Continuous Infusions:  sodium chloride     dextrose 5 % and 0.9% NaCl 10 mL/hr at 09/30/22 1000   PRN Meds:.sodium chloride, acetaminophen **OR** acetaminophen, hydrOXYzine, ondansetron **OR** ondansetron (ZOFRAN) IV, sodium chloride flush, traZODone   Anti-infectives (From admission, onward)    Start     Dose/Rate Route Frequency Ordered Stop   09/26/22 2215  vancomycin (VANCOCIN) capsule 125 mg        125 mg Oral 4 times daily 09/26/22 2129 10/06/22 2159       Subjective: [name] today has no fevers,  No chest pain,   - -Diarrhea improving -Oral intake improving slowly -Will stop IV dextrose and see if blood glucose stays up without IV dextrose -No emesis  Objective: Vitals:   09/29/22 1021 09/29/22 1933 09/30/22 0444 09/30/22 0948  BP: 120/76 123/60 136/77 138/72  Pulse: 61 65 (!) 59 (!) 56  Resp: 18 20 20 18   Temp:  98.3 F (36.8 C) 98 F (36.7 C)   TempSrc:  Oral Oral   SpO2: 100% 99% 99% 100%  Weight:      Height:        Intake/Output Summary (Last 24 hours) at 09/30/2022 1327 Last data filed at 09/30/2022 0900 Gross per 24 hour  Intake 6232.18 ml  Output --  Net 6232.18 ml   Filed Weights   09/26/22 1001  Weight: 67.6 kg   Physical Exam  Gen:- Awake Alert, frail and chronically ill-appearing, HEENT:- [place].AT, No sclera icterus Neck-Supple Neck,No  JVD,.  Lungs-  CTAB , fair symmetrical air movement CV- S1, S2 normal, regular  Abd-  +ve B.Sounds, Abd Soft, No tenderness,    Extremity/Skin:- No  edema, pedal pulses present , left buttock and left heel decubitus ulcers as noted--please see photos in epic Psych-affect is appropriate, oriented x3, some cognitive and memory deficits noted Neuro-generalized weakness no new focal deficits, no tremors Rectum----Flexi-Seal with liquid stool MSK-right arm PICC line--removed on 09/29/2022  Data Reviewed: I have personally reviewed following  labs and imaging studies  CBC: Recent Labs  Lab 09/24/22 0017 09/26/22 1033  WBC 9.4 9.2  NEUTROABS  --  7.8*  HGB 10.3* 9.4*  HCT 32.6* 30.5*  MCV 97.9 99.0  PLT 316 254   Basic Metabolic Panel: Recent Labs  Lab 09/24/22 0017 09/26/22 1033 09/27/22 0406 09/28/22 0436 09/30/22 0410  NA 136 134* 137 140 138  K 4.1 4.0 3.2* 3.5 3.5  CL 96* 96* 101 109 107  CO2 32 30 30 27 25   GLUCOSE 101* 147* 104* 165* 80  BUN 21 25* 24* 22 19  CREATININE 1.42* 1.35* 1.23* 1.12* 1.23*  CALCIUM 8.1* 7.9* 7.9* 7.5* 7.7*  PHOS  --   --  2.5  --  1.8*   GFR: Estimated Creatinine Clearance: 35.1 mL/min (A) (by C-G formula based on SCr of 1.23 mg/dL (H)). Liver Function Tests: Recent Labs  Lab 09/24/22 0017 09/26/22 1033 09/27/22 0406 09/30/22 0410  AST 23 21  --   --   ALT 14 14  --   --   ALKPHOS 175* 138*  --   --   BILITOT 0.7 0.6  --   --   PROT 6.3* 5.3*  --   --   ALBUMIN 2.6* 2.3* 2.1* 2.0*   Cardiac Enzymes: Recent Labs  Lab 09/26/22 1033  CKTOTAL 44   Recent Results (from the past 240 hour(s))  MRSA Next Gen by PCR, Nasal     Status: Abnormal   Collection Time: 09/26/22  5:25 PM   Specimen: Nasal Mucosa; Nasal Swab  Result Value Ref Range Status   MRSA by PCR Next Gen DETECTED (A) NOT DETECTED Final    Comment: RESULT CALLED TO, READ BACK BY AND VERIFIED WITH: A. BAILEY AT 2215 ON 05.02.24 BY ADGER J         The GeneXpert MRSA  Assay (FDA approved for NASAL specimens only), is one component of a comprehensive MRSA colonization surveillance program. It is not intended to diagnose MRSA infection nor to guide or monitor treatment for MRSA infections. Performed at [facility], [address], [place], [place] [address]   C Difficile Quick Screen w PCR reflex     Status: Abnormal   Collection Time: 09/26/22  7:04 PM   Specimen: STOOL  Result Value Ref Range Status   C Diff antigen POSITIVE (A) NEGATIVE Final    Comment: CRITICAL RESULT CALLED TO, READ BACK BY AND VERIFIED WITH: A.BALIEY AT 2116 ON 05.02.2024 BY ADGER J     C Diff toxin POSITIVE (A) NEGATIVE Final    Comment: CRITICAL RESULT CALLED TO, READ BACK BY AND VERIFIED WITH: A. BALIEY AT 2116 ON 05.02.22024 BY ADGER J     C Diff interpretation Toxin producing C. difficile detected.  Final    Comment: CRITICAL RESULT CALLED TO, READ BACK BY AND VERIFIED WITH: A. BAILEY AT 2116 ON [id number] BY ADGER J Performed at [facility], [address] [place], [place] [address]     Radiology Studies: No results found.  Scheduled Meds:  ascorbic acid  500 mg Oral Daily   Chlorhexidine Gluconate Cloth  6 each Topical Q0600   citalopram  40 mg Oral Daily   feeding supplement  237 mL Oral TID   heparin  5,000 Units Subcutaneous Q8H   leptospermum manuka honey  1 Application Topical Daily   levothyroxine  75 mcg Oral q AM   liver oil-zinc oxide   Topical TID   megestrol  400 mg  Oral Daily   mupirocin ointment  1 Application Nasal BID   nystatin   Topical TID   rOPINIRole  3 mg Oral QHS   sodium bicarbonate  650 mg Oral TID   sodium chloride flush  3 mL Intravenous Q12H   sodium chloride flush  3 mL Intravenous Q12H   vancomycin  125 mg Oral QID   zinc sulfate  220 mg Oral Daily   Continuous Infusions:  sodium chloride     dextrose 5 % and 0.9% NaCl 10 mL/hr at 09/30/22 1000    LOS: 2 days   Shon Hale M.D on 09/30/2022 at 1:27 PM  Go to  [email or web address] - for contact info  [facility] - Office  [phone]  If 7PM-7AM, please contact night-coverage [email or web address] 09/30/2022, 1:27 PM

## 2022-09-30 NOTE — Progress Notes (Signed)
Order placed to pull PICC line, per AC/ [place] policy you must be checked off to perform. Charge RN aware. SWOT nurse to be notified to pull PICC line this am.

## 2022-09-30 NOTE — Progress Notes (Signed)
Patient has attempted to get out of bed numerous times during the night. Patient able to be redirected at times but still persist. Dressing changed to sacrum. Tolerated fair.

## 2022-09-30 NOTE — Progress Notes (Signed)
Picc removed from right basilic and line measured 12 cm.  No documentation in chart on insertion length.  Contacted Dr. Jayme Cloud and  he ordered xray

## 2022-10-01 DIAGNOSIS — E162 Hypoglycemia, unspecified: Secondary | ICD-10-CM | POA: Diagnosis not present

## 2022-10-01 DIAGNOSIS — Z86718 Personal history of other venous thrombosis and embolism: Secondary | ICD-10-CM | POA: Diagnosis not present

## 2022-10-01 DIAGNOSIS — E440 Moderate protein-calorie malnutrition: Secondary | ICD-10-CM | POA: Diagnosis not present

## 2022-10-01 DIAGNOSIS — I1310 Hypertensive heart and chronic kidney disease without heart failure, with stage 1 through stage 4 chronic kidney disease, or unspecified chronic kidney disease: Secondary | ICD-10-CM | POA: Diagnosis not present

## 2022-10-01 DIAGNOSIS — K219 Gastro-esophageal reflux disease without esophagitis: Secondary | ICD-10-CM | POA: Diagnosis not present

## 2022-10-01 DIAGNOSIS — F32A Depression, unspecified: Secondary | ICD-10-CM | POA: Diagnosis not present

## 2022-10-01 DIAGNOSIS — I509 Heart failure, unspecified: Secondary | ICD-10-CM | POA: Diagnosis not present

## 2022-10-01 DIAGNOSIS — S42001D Fracture of unspecified part of right clavicle, subsequent encounter for fracture with routine healing: Secondary | ICD-10-CM | POA: Diagnosis not present

## 2022-10-01 DIAGNOSIS — N1832 Chronic kidney disease, stage 3b: Secondary | ICD-10-CM | POA: Diagnosis not present

## 2022-10-01 DIAGNOSIS — E079 Disorder of thyroid, unspecified: Secondary | ICD-10-CM | POA: Diagnosis not present

## 2022-10-01 DIAGNOSIS — F0390 Unspecified dementia without behavioral disturbance: Secondary | ICD-10-CM

## 2022-10-01 DIAGNOSIS — Z743 Need for continuous supervision: Secondary | ICD-10-CM | POA: Diagnosis not present

## 2022-10-01 DIAGNOSIS — E785 Hyperlipidemia, unspecified: Secondary | ICD-10-CM | POA: Diagnosis not present

## 2022-10-01 DIAGNOSIS — J90 Pleural effusion, not elsewhere classified: Secondary | ICD-10-CM | POA: Diagnosis not present

## 2022-10-01 DIAGNOSIS — I824Z2 Acute embolism and thrombosis of unspecified deep veins of left distal lower extremity: Secondary | ICD-10-CM | POA: Diagnosis not present

## 2022-10-01 DIAGNOSIS — J449 Chronic obstructive pulmonary disease, unspecified: Secondary | ICD-10-CM | POA: Diagnosis not present

## 2022-10-01 DIAGNOSIS — E860 Dehydration: Secondary | ICD-10-CM | POA: Diagnosis not present

## 2022-10-01 DIAGNOSIS — S31000D Unspecified open wound of lower back and pelvis without penetration into retroperitoneum, subsequent encounter: Secondary | ICD-10-CM | POA: Diagnosis not present

## 2022-10-01 DIAGNOSIS — I82412 Acute embolism and thrombosis of left femoral vein: Secondary | ICD-10-CM | POA: Diagnosis not present

## 2022-10-01 DIAGNOSIS — I480 Paroxysmal atrial fibrillation: Secondary | ICD-10-CM | POA: Diagnosis not present

## 2022-10-01 DIAGNOSIS — L8994 Pressure ulcer of unspecified site, stage 4: Secondary | ICD-10-CM | POA: Diagnosis not present

## 2022-10-01 DIAGNOSIS — I110 Hypertensive heart disease with heart failure: Secondary | ICD-10-CM | POA: Diagnosis not present

## 2022-10-01 DIAGNOSIS — F0150 Vascular dementia without behavioral disturbance: Secondary | ICD-10-CM | POA: Diagnosis not present

## 2022-10-01 DIAGNOSIS — G9341 Metabolic encephalopathy: Secondary | ICD-10-CM | POA: Diagnosis not present

## 2022-10-01 DIAGNOSIS — L89322 Pressure ulcer of left buttock, stage 2: Secondary | ICD-10-CM | POA: Diagnosis not present

## 2022-10-01 DIAGNOSIS — Z7189 Other specified counseling: Secondary | ICD-10-CM | POA: Diagnosis not present

## 2022-10-01 DIAGNOSIS — N179 Acute kidney failure, unspecified: Secondary | ICD-10-CM | POA: Diagnosis present

## 2022-10-01 DIAGNOSIS — R279 Unspecified lack of coordination: Secondary | ICD-10-CM | POA: Diagnosis not present

## 2022-10-01 DIAGNOSIS — F419 Anxiety disorder, unspecified: Secondary | ICD-10-CM | POA: Diagnosis not present

## 2022-10-01 DIAGNOSIS — E871 Hypo-osmolality and hyponatremia: Secondary | ICD-10-CM | POA: Diagnosis not present

## 2022-10-01 DIAGNOSIS — I82432 Acute embolism and thrombosis of left popliteal vein: Secondary | ICD-10-CM | POA: Diagnosis not present

## 2022-10-01 DIAGNOSIS — K449 Diaphragmatic hernia without obstruction or gangrene: Secondary | ICD-10-CM | POA: Diagnosis not present

## 2022-10-01 DIAGNOSIS — E8809 Other disorders of plasma-protein metabolism, not elsewhere classified: Secondary | ICD-10-CM | POA: Diagnosis not present

## 2022-10-01 DIAGNOSIS — N390 Urinary tract infection, site not specified: Secondary | ICD-10-CM | POA: Diagnosis not present

## 2022-10-01 DIAGNOSIS — I130 Hypertensive heart and chronic kidney disease with heart failure and stage 1 through stage 4 chronic kidney disease, or unspecified chronic kidney disease: Secondary | ICD-10-CM | POA: Diagnosis not present

## 2022-10-01 DIAGNOSIS — E039 Hypothyroidism, unspecified: Secondary | ICD-10-CM | POA: Diagnosis not present

## 2022-10-01 DIAGNOSIS — I5032 Chronic diastolic (congestive) heart failure: Secondary | ICD-10-CM | POA: Diagnosis not present

## 2022-10-01 DIAGNOSIS — Z515 Encounter for palliative care: Secondary | ICD-10-CM | POA: Diagnosis not present

## 2022-10-01 DIAGNOSIS — F0393 Unspecified dementia, unspecified severity, with mood disturbance: Secondary | ICD-10-CM | POA: Diagnosis not present

## 2022-10-01 DIAGNOSIS — R509 Fever, unspecified: Secondary | ICD-10-CM | POA: Diagnosis not present

## 2022-10-01 DIAGNOSIS — E872 Acidosis, unspecified: Secondary | ICD-10-CM | POA: Diagnosis not present

## 2022-10-01 DIAGNOSIS — D519 Vitamin B12 deficiency anemia, unspecified: Secondary | ICD-10-CM | POA: Diagnosis not present

## 2022-10-01 DIAGNOSIS — I959 Hypotension, unspecified: Secondary | ICD-10-CM | POA: Diagnosis not present

## 2022-10-01 DIAGNOSIS — R531 Weakness: Secondary | ICD-10-CM | POA: Diagnosis not present

## 2022-10-01 DIAGNOSIS — F0394 Unspecified dementia, unspecified severity, with anxiety: Secondary | ICD-10-CM | POA: Diagnosis not present

## 2022-10-01 DIAGNOSIS — Z833 Family history of diabetes mellitus: Secondary | ICD-10-CM | POA: Diagnosis not present

## 2022-10-01 DIAGNOSIS — F03918 Unspecified dementia, unspecified severity, with other behavioral disturbance: Secondary | ICD-10-CM | POA: Diagnosis present

## 2022-10-01 DIAGNOSIS — Z79899 Other long term (current) drug therapy: Secondary | ICD-10-CM | POA: Diagnosis not present

## 2022-10-01 DIAGNOSIS — S42031A Displaced fracture of lateral end of right clavicle, initial encounter for closed fracture: Secondary | ICD-10-CM | POA: Diagnosis not present

## 2022-10-01 DIAGNOSIS — D649 Anemia, unspecified: Secondary | ICD-10-CM | POA: Diagnosis not present

## 2022-10-01 DIAGNOSIS — R627 Adult failure to thrive: Secondary | ICD-10-CM | POA: Diagnosis not present

## 2022-10-01 DIAGNOSIS — A0472 Enterocolitis due to Clostridium difficile, not specified as recurrent: Secondary | ICD-10-CM | POA: Diagnosis present

## 2022-10-01 DIAGNOSIS — K922 Gastrointestinal hemorrhage, unspecified: Secondary | ICD-10-CM | POA: Diagnosis not present

## 2022-10-01 DIAGNOSIS — A0471 Enterocolitis due to Clostridium difficile, recurrent: Secondary | ICD-10-CM | POA: Diagnosis not present

## 2022-10-01 DIAGNOSIS — I82402 Acute embolism and thrombosis of unspecified deep veins of left lower extremity: Secondary | ICD-10-CM | POA: Diagnosis not present

## 2022-10-01 DIAGNOSIS — L03115 Cellulitis of right lower limb: Secondary | ICD-10-CM | POA: Diagnosis not present

## 2022-10-01 DIAGNOSIS — L89154 Pressure ulcer of sacral region, stage 4: Secondary | ICD-10-CM | POA: Diagnosis not present

## 2022-10-01 DIAGNOSIS — L89153 Pressure ulcer of sacral region, stage 3: Secondary | ICD-10-CM | POA: Diagnosis not present

## 2022-10-01 DIAGNOSIS — R52 Pain, unspecified: Secondary | ICD-10-CM | POA: Diagnosis not present

## 2022-10-01 DIAGNOSIS — M810 Age-related osteoporosis without current pathological fracture: Secondary | ICD-10-CM | POA: Diagnosis not present

## 2022-10-01 DIAGNOSIS — R0989 Other specified symptoms and signs involving the circulatory and respiratory systems: Secondary | ICD-10-CM | POA: Diagnosis not present

## 2022-10-01 DIAGNOSIS — N3000 Acute cystitis without hematuria: Secondary | ICD-10-CM | POA: Diagnosis not present

## 2022-10-01 DIAGNOSIS — T796XXD Traumatic ischemia of muscle, subsequent encounter: Secondary | ICD-10-CM | POA: Diagnosis not present

## 2022-10-01 DIAGNOSIS — R195 Other fecal abnormalities: Secondary | ICD-10-CM | POA: Diagnosis not present

## 2022-10-01 DIAGNOSIS — J4489 Other specified chronic obstructive pulmonary disease: Secondary | ICD-10-CM | POA: Diagnosis not present

## 2022-10-01 DIAGNOSIS — G2581 Restless legs syndrome: Secondary | ICD-10-CM | POA: Diagnosis not present

## 2022-10-01 DIAGNOSIS — N183 Chronic kidney disease, stage 3 unspecified: Secondary | ICD-10-CM | POA: Diagnosis not present

## 2022-10-01 DIAGNOSIS — F411 Generalized anxiety disorder: Secondary | ICD-10-CM | POA: Diagnosis not present

## 2022-10-01 DIAGNOSIS — L89620 Pressure ulcer of left heel, unstageable: Secondary | ICD-10-CM | POA: Diagnosis not present

## 2022-10-01 DIAGNOSIS — F332 Major depressive disorder, recurrent severe without psychotic features: Secondary | ICD-10-CM | POA: Diagnosis not present

## 2022-10-01 LAB — GLUCOSE, CAPILLARY
Glucose-Capillary: 100 mg/dL — ABNORMAL HIGH (ref 70–99)
Glucose-Capillary: 100 mg/dL — ABNORMAL HIGH (ref 70–99)
Glucose-Capillary: 102 mg/dL — ABNORMAL HIGH (ref 70–99)
Glucose-Capillary: 116 mg/dL — ABNORMAL HIGH (ref 70–99)
Glucose-Capillary: 182 mg/dL — ABNORMAL HIGH (ref 70–99)
Glucose-Capillary: 53 mg/dL — ABNORMAL LOW (ref 70–99)

## 2022-10-01 LAB — RENAL FUNCTION PANEL
Albumin: 2.2 g/dL — ABNORMAL LOW (ref 3.5–5.0)
Anion gap: 6 (ref 5–15)
BUN: 21 mg/dL (ref 8–23)
CO2: 25 mmol/L (ref 22–32)
Calcium: 8.1 mg/dL — ABNORMAL LOW (ref 8.9–10.3)
Chloride: 106 mmol/L (ref 98–111)
Creatinine, Ser: 1.19 mg/dL — ABNORMAL HIGH (ref 0.44–1.00)
GFR, Estimated: 47 mL/min — ABNORMAL LOW (ref >60)
Glucose, Bld: 81 mg/dL (ref 70–99)
Phosphorus: 2 mg/dL — ABNORMAL LOW (ref 2.5–4.6)
Potassium: 3.6 mmol/L (ref 3.5–5.1)
Sodium: 137 mmol/L (ref 135–145)

## 2022-10-01 MED ORDER — OXYCODONE HCL 5 MG PO TABS
5.0000 mg | ORAL_TABLET | ORAL | Status: DC | PRN
  Administered 2022-10-01: 5 mg via ORAL
  Filled 2022-10-01: qty 1

## 2022-10-01 MED ORDER — POTASSIUM CHLORIDE 20 MEQ PO PACK: 20.0000 meq | PACK | Freq: Every day | ORAL | 0 refills | Status: DC

## 2022-10-01 MED ORDER — TRAMADOL HCL 50 MG PO TABS: 50.0000 mg | ORAL_TABLET | Freq: Two times a day (BID) | ORAL | 0 refills | Status: DC | PRN

## 2022-10-01 MED ORDER — ZINC 220 (50 ZN) MG PO CAPS: 220.0000 mg | ORAL_CAPSULE | Freq: Every morning | ORAL | 5 refills | Status: DC

## 2022-10-01 MED ORDER — GLUCOSE 4 G PO CHEW: 1.0000 | CHEWABLE_TABLET | ORAL | 12 refills | Status: AC | PRN

## 2022-10-01 MED ORDER — VANCOMYCIN HCL 125 MG PO CAPS: 125.0000 mg | ORAL_CAPSULE | ORAL | 0 refills | Status: DC

## 2022-10-01 MED ORDER — TRAZODONE HCL 50 MG PO TABS: 50.0000 mg | ORAL_TABLET | Freq: Every day | ORAL | 5 refills | Status: DC

## 2022-10-01 MED ORDER — FERROUS SULFATE 325 (65 FE) MG PO TABS: 325.0000 mg | ORAL_TABLET | Freq: Every day | ORAL | 5 refills | Status: AC

## 2022-10-01 MED ORDER — ACETAMINOPHEN 325 MG PO TABS: 650.0000 mg | ORAL_TABLET | Freq: Four times a day (QID) | ORAL | 1 refills | Status: DC | PRN

## 2022-10-01 MED ORDER — HYDROXYZINE HCL 10 MG PO TABS: 10.0000 mg | ORAL_TABLET | Freq: Three times a day (TID) | ORAL | 3 refills | Status: DC | PRN

## 2022-10-01 MED ORDER — OXYCODONE HCL 5 MG PO TABA: 5.0000 mg | ORAL_TABLET | Freq: Every day | ORAL | 0 refills | Status: DC | PRN

## 2022-10-01 MED ORDER — ENSURE COMPLETE PO LIQD: 237.0000 mL | Freq: Two times a day (BID) | ORAL | 2 refills | Status: DC

## 2022-10-01 MED ORDER — MEGESTROL ACETATE 400 MG/10ML PO SUSP: 400.0000 mg | Freq: Two times a day (BID) | ORAL | 0 refills | Status: DC

## 2022-10-01 MED ORDER — SODIUM BICARBONATE 650 MG PO TABS: 650.0000 mg | ORAL_TABLET | Freq: Three times a day (TID) | ORAL | 2 refills | Status: AC

## 2022-10-01 NOTE — Care Management Important Message (Signed)
Important Message  Patient Details  Name: [name] MRN: [id number] Date of Birth: 10/20/1945   Medicare Important Message Given:  Yes (spoke with son Nashiyah Hofmeyer at [phone] to review letter, no additional copy needed)     Corey Harold 10/01/2022, 12:06 PM

## 2022-10-01 NOTE — TOC Transition Note (Signed)
Transition of Care [facility]) - CM/SW Discharge Note   Patient Details  Name: [name] MRN: [id number] Date of Birth: [birth year]  Transition of Care [facility]) CM/SW Contact:  Elliot Gault, LCSW Phone Number: 10/01/2022, 11:38 AM   Clinical Narrative:     Pt medically stable for dc today per MD. Updated Herbert Seta at [facility] and they can accept pt back today.  Updated pt's son who remains in agreement with dc plan.   DC clinical sent electronically. RN to call report. EMS arranged.  There are no other TOC needs for dc.  Final next level of care: Skilled Nursing Facility Barriers to Discharge: Barriers Resolved   Patient Goals and CMS Choice      Discharge Placement                         Discharge Plan and Services Additional resources added to the After Visit Summary for   In-house Referral: Clinical Social Work   Post Acute Care Choice: Resumption of Svcs/PTA Provider                               Social Determinants of Health (SDOH) Interventions SDOH Screenings   Food Insecurity: No Food Insecurity (09/26/2022)  Housing: Low Risk  (09/26/2022)  Transportation Needs: No Transportation Needs (09/26/2022)  Utilities: Not At Risk (09/26/2022)  Alcohol Screen: Low Risk  (11/28/2021)  Depression (PHQ2-9): Low Risk  (05/06/2022)  Financial Resource Strain: Low Risk  (11/28/2021)  Physical Activity: Inactive (11/28/2021)  Social Connections: Socially Integrated (11/28/2021)  Stress: Stress Concern Present (11/28/2021)  Tobacco Use: Low Risk  (09/30/2022)     Readmission Risk Interventions    08/30/2022    3:36 PM  Readmission Risk Prevention Plan  Transportation Screening Complete  PCP or Specialist Appt within 3-5 Days Complete  Social Work Consult for Recovery Care Planning/Counseling Complete  Palliative Care Screening Not Applicable  Medication Review Oceanographer) Complete

## 2022-10-01 NOTE — Progress Notes (Signed)
Palliative: Mrs. Deberry is lying quietly in bed.  She appears acutely/chronically ill and somewhat frail.  She will make an somewhat keep eye contact.  She is alert, oriented to self only.  I believe that she can make her basic needs known.  There is no family at bedside at this time.   We talk about returning to [facility] today.  No questions or concerns.  Overall, Mrs. Vanosten has been pleasant.  Conference with bedside nursing staff, transition of care team related to patient condition, needs, goals of care, disposition.  Plan: Continue to treat the treatable but no CPR or intubation.  Return to [facility] anticipate long-term care/memory care.  Would clearly benefit from palliative/hospice care services. DNR/goldenrod form completed and placed on chart.  25 minutes Lillia Carmel, NP Palliative medicine team Team phone [phone] Greater than 50% of this time was spent counseling and coordinating care related to the above assessment and plan.

## 2022-10-01 NOTE — Progress Notes (Signed)
During assessment of patient at the beginning of the shift, it was noted that pt. Has leakage around flexiseal/recta tube. Pt. Cleansed thoroughly and barrier cream/ordered creams applied per order. Pt. Complained of pain in sacral wound area and pain from rectal area due to excoriation. MD notified. Also noted that upon patient rounding checks and or vital rounding's, and medication rounding, pt. Seemed very jumpy and would squeal and yell. Nurse explained in detail and reoriented patient. Pt. Stated that she can't sleep and that she wants to go to sleep but can't. PRN bedtime medication give.   Around 1:00am: during pt. Check rounding's, pt. Was found talking on the phone to a family member. It was noted that since the beginning of the shift, pt. Had been talking on the phone multiple occasions, as family member would call pt. During this particular phone call, it was noted that pt. Became more anxious and pt. Attempted to hang up, however, the family member would not all pt. To hang up (Kept on maintaining a long conversation). Pt. Put the phone down and nures heard family member talking and talking on the phone. Nurse informed family member respectively that pt. Needs rest, to please callback during visiting hours. Family member also attempted to maintain a long conversation with nursing. Pt. Educated on the need to rest for healing and health. Pt. Stated that family member kept calling and calling, this is why she couldn't rest. Nurse tech also at bedside and noted the situation on a few occasions.   MD ordered medication for pain, meds given per pain scale. Pt. Lying in bed with eyes closed resting. Will continue to monitor and report off to oncoming shift.

## 2022-10-01 NOTE — Discharge Summary (Addendum)
[name], is a 77 y.o. female  DOB [birth year]  MRN [id number].  Admission date:  09/26/2022  Admitting Physician  Shon Hale, MD  Discharge Date:  10/01/2022   Primary MD  [name], FNP  Recommendations for primary care physician for things to follow:   1)Oral vancomycin taper for C. difficile colitis infection as follows:- take Vancomycin  125 mg--1 capsule 4 times a day for 14 days, followed by 1 capsule twice daily for 7 days, then 1 capsule once daily for 7 days, then 1 capsule every other day for 7 days, then 1 capsule every 3 days for 7 days then Stop 2)Keep Rt Upper Extremity in Sling- and  follow-up with Dr. Fuller Canada  from orthopedic surgery in 2 weeks for recheck of your right upper extremity injury 3)Patient needs help with feeding due to right upper extremity injury--please assign somebody to feed patient at every meal 4)Please give nutritional supplements like Ensure 2-3 times every day to prevent low blood sugar 5)Okay to use glucose tablets if blood sugar drops--- 6) patient's episodes of low blood sugar are related to poor oral intake--- please encourage patient to eat and please assign somebody to feed patient at every meal to prevent further episodes of low blood sugar 7)Repeat CBC and BMP blood test on Monday, 10/07/2022 advised 8)Wound care to left buttock Stage 4 pressure injury: Cleanse with Normal Saline, pat dry. Fill defect with NS moistened gauze using a cotton tipped applicator to fill areas of undermining. Top with dry gauze and secure with silicone foam dressing. Change twice daily and PRN soiling 9)Wound care to left heel healing stage 2 pressure injury: Cleanse with Normal Saline, pat dry. Cover with xeroform gauze [place] # [id number]), top with dry gauze and secure with Kerlix roll gauze/paper tape. Place foot (feet) into Prevalon boots  Admission Diagnosis   Hypoglycemia [E16.2]  Discharge Diagnosis  Hypoglycemia [E16.2]    Principal Problem:   Hypoglycemia Active Problems:   Dementia without behavioral disturbance (HCC)   Acquired hypothyroidism   COPD (chronic obstructive pulmonary disease) (HCC)   Essential hypertension   Malnutrition of moderate degree   Atrial fibrillation (HCC)   Stage 3b chronic kidney disease (CKD) (HCC)   Failure to thrive in adult   Decubitus ulcer   Frequent falls      Past Medical History:  Diagnosis Date   Allergy    Bradycardia    Bronchitis, chronic (HCC)    Chronic anxiety    Complication of anesthesia    hard to wake up   COPD (chronic obstructive pulmonary disease) (HCC)    Depression    Esophageal reflux    Headache(784.0)    Hyperlipidemia    Hypertension    Hypothyroidism    Obesity    Osteoporosis    Overactive bladder    PVC's (premature ventricular contractions)    Thyroid disease    Vitamin D deficiency disease     Past Surgical History:  Procedure Laterality Date   ABDOMINAL HYSTERECTOMY  CENTRAL LINE INSERTION Right 01/10/2022   Procedure: CENTRAL LINE INSERTION;  Surgeon: Lucretia Roers, MD;  Location: [facility];  Service: General;  Laterality: Right;   CHOLECYSTECTOMY N/A 11/04/2013   Procedure: LAPAROSCOPIC CHOLECYSTECTOMY WITH INTRAOPERATIVE CHOLANGIOGRAM;  Surgeon: Wilmon Arms. Corliss Skains, MD;  Location: [facility];  Service: General;  Laterality: N/A;   COLONOSCOPY     FRACTURE SURGERY     pins removed from Hip surgery   HIP FRACTURE SURGERY  1990    pins removed in 1991   IR FLUORO GUIDE CV LINE LEFT  01/31/2022   IR US GUIDE VASC ACCESS LEFT  01/31/2022   LAPAROTOMY N/A 01/10/2022   Procedure: EXPLORATORY LAPAROTOMY,;  Surgeon: Lucretia Roers, MD;  Location: [facility];  Service: General;  Laterality: N/A;   LYSIS OF ADHESION N/A 01/10/2022   Procedure: LYSIS OF ADHESION;  Surgeon: Lucretia Roers, MD;  Location: [facility];  Service: General;  Laterality: N/A;   PARTIAL  HYSTERECTOMY  1987   SPINAL FUSION     THORACENTESIS N/A 01/18/2022   Procedure: THORACENTESIS;  Surgeon: Lorin Glass, MD;  Location: [facility] END[facility];  Service: Pulmonary;  Laterality: N/A;   UPPER GASTROINTESTINAL ENDOSCOPY       HPI  from the history and physical done on the day of admission:   [name]  is a 77 y.o. female with medical history significant of  hypothyroidism, GERD, RLS, CKD 3B presenting from [facility] SNF facility with episodes of recurrent hypoglycemia resulting in altered mentation -Additional history obtained from patient's brother [name] at bedside -Patient has chronic diarrhea -Also had poor appetite -Due to right upper extremity fracture she has been unable to feed himself----resulting in episodes of low sugars -Despite replacements in the ED by EMS and at SNF facility hypoglycemia persisted so she was started on continuous dextrose fluids was requested No fever  Or chills , -No vomiting -CT head without acute findings -Creatinine is 1.25 which is close to patient's baseline -Glucose was down to 43 in the ED despite replacement earlier by EMS when it was 55 at facility -Sodium is 134 -Hgb 9.4, WBC 9.2   Hospital Course:     Brief Narrative:  77 y.o. female with medical history significant of  hypothyroidism, GERD, RLS, CKD 3B presenting from [facility] facility with episodes of recurrent hypoglycemia resulting in altered mentation admitted on 09/26/2022 and found to have C. difficile colitis, decubitus ulcers and recurrent hypoglycemia     -Assessment and Plan: 1)Recurrent Hypoglycemia--multifactorial in the setting of poor oral intake and diarrhea  --Oral intake improving with Megace -Due to right upper extremity fracture she has been unable to feed himself----resulting in episodes of low sugars - proinsulin/insulin level pending -Started Megace for appetite stimulation on 09/28/22 -A1c is only 4.5 -IV dextrose was reduced on 09/29/2022=- -IV  dextrose was discontinued on 09/30/2022 --Eating better glucose more stable now without IV dextrose X >  24 hours now   2) C. difficile colitis with diarrhea--oral Vanco taper as ordered -Patient was treated for C. difficile infection almost 4 weeks ago -She will need a oral Vanco taper at this time -Oral vancomycin taper for C. difficile colitis infection as follows:- take Vancomycin  125 mg--1 capsule 4 times a day for 14 days, followed by 1 capsule twice daily for 7 days, then 1 capsule once daily for 7 days, then 1 capsule every other day for 7 days, then 1 capsule every 3 days for 7 days then Stop -  Please Stop cholestyramine and also Stop Imodium due to active C. difficile infection requiring oral Vanco treatment   3)Hyponatremia----most likely due to dehydration and poor oral intake -Hyponatremia resolved with hydration   4)Acute metabolic encephalopathy--- secondary to hypoglycemia and C. difficile infection -Much improved, mentation is back to  baseline according to patient's son  -at Baseline patient has cognitive and memory deficits due to underlying dementia   5)Hypothyroidism: TSH was 1.0 on 07/06/2022  -continue Synthyroid   6)RLS: Continue Requip   7)Moderate protein calorie moderation: Albumin of 2.6.   -Ensure and other nutritional supplements including Pro-Stat advised   8)CKD stage IIIb:-  -Creatinine improved to 1.19 from 1.42 after hydration -Repeat BMP on 10/07/2022 renally adjust medications, avoid nephrotoxic agents / dehydration  / hypotension     9)Social/Ethics--plan of care and advanced directive discussed with patient, her son Dondrea Caviness and her brother [name] -Patient is a DNR/DNI without limitations to treatment otherwise   10)Rt Arm Picc Line In situ---POA -Indication unclear -Removed PICC line on 09/30/2022   11)Dementia---Pt has Obvious cognitive and memory deficits. Patient has difficulty understanding complex issues ans has difficulty  making complex decisions Elene Krumrey is unable to understand (without significant language barrier) her current medical diagnosis, patient is Unable to appropriately verbalize understanding of proposed treatment options including option of no treatment, unable to understand the consequences/risk Versus benefit of each treatment option, alternatives as well as the option of No treatment. Based on my evaluation Cuca Morefield  appears to Not have the Capacity to make decisions and is unable to give informed consent about her medical care.  A surrogate decision-maker is required as Veronia Beets  as she appears Not to have capacity to make her own decisions and/or give informed consent regarding her medical care Her son son  Mr. Dimond Rascon is her HCPOA/surrogate decision maker   12)Chronic Anemia--multi factorial in the setting of poor oral intake, -Prior workup consistent with iron deficiency, B12 was WNL -No acute bleeding concerns - Hgb may have dropped due to hydration with dextrose solution -Repeat CBC on Monday, 10/07/2022 -Continue iron supplementation   13)Rt Shoulder/Upper Extremity---keep in sling ,  follow-up with Dr. Fuller Canada  from orthopedic surgery in 2 weeks for recheck of your right upper extremity injury   14)Sacral Decubitus Ulcer--POA --Left heel pressure injury noted -Large left buttock decubitus ulcer deep to the bone- please see photos in epic see photos in epic -Wound care consult appreciated Left buttock Stage 4 pressure injury --POA -Left heel with healing stage 2 PI measuring 2.5cm x 2cm, scant serous exudate  -Wound care consult appreciated please see below  -Wound type:pressure Pressure Injury POA: Yes Measurement:Per nursing flow sheet from 09/26/22: Left buttock Stage 4: 8.5cm x 8cm x 0.5cm with 60% red wound bed and 40% yellow fibrinous slough. Moderate exudate consistent with autolytically debriding yellow slough. Left heel with healing stage 2 PI  measuring 2.5cm x 2cm, scant serous exudate Wound bed:As noted above Drainage (amount, consistency, odor) As noted above Periwound:mild erythema Dressing procedure/placement/frequency: A mattress replacement is provided as well as bilateral Prevalon boots. Turning and repositioning is in place; guidance is provided to minimize time in the supine position. Topical care to the buttock Stage 4 pressure injury will be with twice daily dressings using sodium hypochlorite solution for three days. After 3 days, twice daily dressings using normal saline is recommended. These are to be topped with dry dressings and secured with silicone foam. After  this care, it is likely that use of a silver hydrofiber dressing applied once daily would be appropriate. Wound care to the left heel will be with once daily application of xeroform gauze (antimicrobial, nonadherent) topped with dry gauze and secured with Kerlix roll gauze/paper tape. The heel is to be placed into a pressure redistribution heel boot.   Discharge Condition: stable  Follow UP   Follow-up Information     Daphine Deutscher, Mary-Margaret, FNP Follow up in 1 week(s).   Specialty: Family Medicine Why: Repeat CBC and BMP in 1 week Contact information: [address] [place] [address] [phone]         Vickki Hearing, MD. Schedule an appointment as soon as possible for a visit in 1 week(s).   Specialties: Orthopedic Surgery, Radiology Why: Rt Upper Extremity ---Needs repeat Xray Contact information: [address] [place] [address] [phone]                Diet and Activity recommendation:  As advised  Discharge Instructions    Discharge Instructions     Call MD for:  difficulty breathing, headache or visual disturbances   Complete by: As directed    Call MD for:  persistant dizziness or light-headedness   Complete by: As directed    Call MD for:  persistant nausea and vomiting   Complete by: As  directed    Call MD for:  redness, tenderness, or signs of infection (pain, swelling, redness, odor or green/yellow discharge around incision site)   Complete by: As directed    Call MD for:  severe uncontrolled pain   Complete by: As directed    Call MD for:  temperature >100.4   Complete by: As directed    Diet - low sodium heart healthy   Complete by: As directed    Discharge instructions   Complete by: As directed    1)Oral vancomycin taper for C. difficile colitis infection as follows:- take Vancomycin  125 mg--1 capsule 4 times a day for 14 days, followed by 1 capsule twice daily for 7 days, then 1 capsule once daily for 7 days, then 1 capsule every other day for 7 days, then 1 capsule every 3 days for 7 days then Stop 2) follow-up with Dr. Fuller Canada  from orthopedic surgery in 2 weeks for recheck of your right upper extremity injury 3) patient needs help with feeding due to right upper extremity injury--please assign somebody to feed patient at every meal 4) please give nutritional supplements like Ensure 2-3 times every day to prevent low blood sugar 5) okay to use glucose tablets if blood sugar drops--- 6) patient's episodes of low blood sugar are related to poor oral intake--- please encourage patient to eat and please assign somebody to feed patient at every meal to prevent further episodes of low blood sugar 7) repeat CBC and BMP blood test on Monday, 10/07/2022 advised 8)Wound care to left buttock Stage 4 pressure injury: Cleanse with Normal Saline, pat dry. Fill defect with NS moistened gauze using a cotton tipped applicator to fill areas of undermining. Top with dry gauze and secure with silicone foam dressing. Change twice daily and PRN soiling 9)Wound care to left heel healing stage 2 pressure injury: Cleanse with Normal Saline, pat dry. Cover with xeroform gauze [place] # [id number]), top with dry gauze and secure with Kerlix roll gauze/paper tape. Place foot (feet) into Prevalon  boots   Discharge wound care:   Complete by: As directed  1)Wound care to left buttock Stage 4 pressure injury: Cleanse with Normal Saline, pat dry. Fill defect with NS moistened gauze using a cotton tipped applicator to fill areas of undermining. Top with dry gauze and secure with silicone foam dressing. Change twice daily and PRN soiling 2)Wound care to left heel healing stage 2 pressure injury: Cleanse with Normal Saline, pat dry. Cover with xeroform gauze [place] # 294), top with dry gauze and secure with Kerlix roll gauze/paper tape. Place foot (feet) into Prevalon boots   Increase activity slowly   Complete by: As directed        Discharge Medications     Allergies as of 10/01/2022       Reactions   Celebrex [celecoxib] Swelling   Keflet [cephalexin] Swelling   Penicillins Hives, Swelling   Sulfa Antibiotics Swelling   Kenalog [triamcinolone Acetonide]    unknown   Latex Other (See Comments)   No reaction listed on MAR   Lisinopril Other (See Comments)   unknown   Mobic [meloxicam]    SWELLING   Penicillin G Other (See Comments)   No reaction listed on MAR        Medication List     STOP taking these medications    cholestyramine 4 g packet Commonly known as: Questran   dicyclomine 10 MG capsule Commonly known as: BENTYL   loperamide 2 MG capsule Commonly known as: IMODIUM   loperamide 2 MG tablet Commonly known as: IMODIUM A-D       TAKE these medications    acetaminophen 325 MG tablet Commonly known as: TYLENOL Take 2 tablets (650 mg total) by mouth every 6 (six) hours as needed for mild pain (or Fever >/= 101).   ascorbic acid 500 MG tablet Commonly known as: VITAMIN C Take 500 mg by mouth 2 (two) times daily.   citalopram 40 MG tablet Commonly known as: CeleXA Take 1 tablet (40 mg total) by mouth daily.   clotrimazole-betamethasone cream Commonly known as: LOTRISONE Apply 1 Application topically daily. Apply to perineum/inner buttocks  topically every day shift for rash for 7 days.   collagenase 250 UNIT/GM ointment Commonly known as: SANTYL Apply 1 Application topically daily. Apply to wound bed topically every day shift for healing.   ferrous sulfate 325 (65 FE) MG tablet Take 1 tablet (325 mg total) by mouth daily with breakfast.   glucose 4 GM chewable tablet Chew 1 tablet (4 g total) by mouth as needed for low blood sugar.   hydrOXYzine 10 MG tablet Commonly known as: ATARAX Take 1 tablet (10 mg total) by mouth 3 (three) times daily as needed for anxiety.   leptospermum manuka honey Pste paste Apply 1 Application topically daily.   levothyroxine 75 MCG tablet Commonly known as: SYNTHROID Take 1 tablet (75 mcg total) by mouth daily.   Magnesium Oxide 400 MG Caps Take 1 capsule (400 mg total) by mouth daily.   megestrol 400 MG/10ML suspension Commonly known as: MEGACE Take 10 mLs (400 mg total) by mouth 2 (two) times daily. For appetite stimulation   multivitamin tablet Take 1 tablet by mouth every morning.   nutrition supplement (JUVEN) Pack Take 1 packet by mouth 2 (two) times daily between meals. What changed: Another medication with the same name was added. Make sure you understand how and when to take each.   feeding supplement (ENSURE COMPLETE) Liqd Take 237 mLs by mouth 2 (two) times daily between meals. What changed: You were already taking a medication  with the same name, and this prescription was added. Make sure you understand how and when to take each.   nystatin powder Commonly known as: MYCOSTATIN/NYSTOP Apply topically 3 (three) times daily. On groins   OxyCODONE HCl (Abuse Deter) 5 MG Taba Commonly known as: OXAYDO Take 5 mg by mouth daily as needed (severe pain).   potassium chloride 20 MEQ packet Commonly known as: KLOR-CON Take 20 mEq by mouth daily for 14 days. What changed:  how much to take Another medication with the same name was removed. Continue taking this  medication, and follow the directions you see here.   Pro-Stat Liqd Take 30 mLs by mouth 2 (two) times daily.   rOPINIRole 2 MG 24 hr tablet Commonly known as: REQUIP XL Take 2 mg by mouth at bedtime. What changed: Another medication with the same name was removed. Continue taking this medication, and follow the directions you see here.   sodium bicarbonate 650 MG tablet Take 1 tablet (650 mg total) by mouth 3 (three) times daily.   traMADol 50 MG tablet Commonly known as: Ultram Take 1 tablet (50 mg total) by mouth every 12 (twelve) hours as needed for moderate pain. What changed:  medication strength how much to take when to take this reasons to take this   traZODone 50 MG tablet Commonly known as: DESYREL Take 1 tablet (50 mg total) by mouth at bedtime.   vancomycin 125 MG capsule Commonly known as: VANCOCIN Take 1 capsule (125 mg total) by mouth See admin instructions. Take Vancomycin  125 mg--1 capsule 4 times a day for 14 days, followed by 1 capsule twice daily for 7 days, then 1 capsule once daily for 7 days, then 1 capsule every other day for 7 days, then 1 capsule every 3 days for 7 days then Stop   vitamin D3 50 MCG (2000 UT) Caps Take 4,000 Units by mouth every morning.   Zinc 220 (50 Zn) MG Caps Take 220 mg by mouth every morning.               Discharge Care Instructions  (From admission, onward)           Start     Ordered   10/01/22 0000  Discharge wound care:       Comments: 1)Wound care to left buttock Stage 4 pressure injury: Cleanse with Normal Saline, pat dry. Fill defect with NS moistened gauze using a cotton tipped applicator to fill areas of undermining. Top with dry gauze and secure with silicone foam dressing. Change twice daily and PRN soiling 2)Wound care to left heel healing stage 2 pressure injury: Cleanse with Normal Saline, pat dry. Cover with xeroform gauze [place] # 294), top with dry gauze and secure with Kerlix roll gauze/paper  tape. Place foot (feet) into Prevalon boots   10/01/22 1055           Major procedures and Radiology Reports - PLEASE review detailed and final reports for all details, in brief -   DG CHEST PORT 1 VIEW  Result Date: 09/30/2022 CLINICAL DATA:  PICC.  Removed prior to, to the hospital EXAM: PORTABLE CHEST 1 VIEW COMPARISON:  X-ray 07/06/2022 FINDINGS: Enlarged cardiopericardial silhouette with tortuous and ectatic aorta. Film is rotated to the right. Small effusions with mild adjacent opacity. Slight central vascular congestion. No pneumothorax. Overlapping cardiac leads. Osteopenia. Moderate hiatal hernia IMPRESSION: Rotated radiograph. Bilateral small effusions with the adjacent opacities. Enlarged heart with vascular congestion Electronically Signed  By: Karen Kays M.D.   On: 09/30/2022 20:07   CT Head Wo Contrast  Result Date: 09/26/2022 CLINICAL DATA:  Delirium.  Altered mental status. EXAM: CT HEAD WITHOUT CONTRAST TECHNIQUE: Contiguous axial images were obtained from the base of the skull through the vertex without intravenous contrast. RADIATION DOSE REDUCTION: This exam was performed according to the departmental dose-optimization program which includes automated exposure control, adjustment of the mA and/or kV according to patient size and/or use of iterative reconstruction technique. COMPARISON:  Head CT 09/24/2022. FINDINGS: Brain: No acute hemorrhage. Unchanged mild chronic small-vessel disease. Cortical gray-white differentiation is otherwise preserved. No hydrocephalus or extra-axial collection. No mass effect or midline shift. Vascular: No hyperdense vessel or unexpected calcification. Skull: No calvarial fracture or suspicious bone lesion. Skull base is unremarkable. Sinuses/Orbits: Mucous retention cysts in the left maxillary and left inferior frontal sinus. Orbits are unremarkable. Other: None. IMPRESSION: 1. No acute intracranial abnormality. 2. Unchanged mild chronic small-vessel  disease. Electronically Signed   By: Orvan Falconer M.D.   On: 09/26/2022 12:15   CT Head Wo Contrast  Result Date: 09/24/2022 CLINICAL DATA:  Head trauma EXAM: CT HEAD WITHOUT CONTRAST CT CERVICAL SPINE WITHOUT CONTRAST TECHNIQUE: Multidetector CT imaging of the head and cervical spine was performed following the standard protocol without intravenous contrast. Multiplanar CT image reconstructions of the cervical spine were also generated. RADIATION DOSE REDUCTION: This exam was performed according to the departmental dose-optimization program which includes automated exposure control, adjustment of the mA and/or kV according to patient size and/or use of iterative reconstruction technique. COMPARISON:  None Available. FINDINGS: CT HEAD FINDINGS Brain: There is no mass, hemorrhage or extra-axial collection. There is generalized atrophy without lobar predilection. There is hypoattenuation of the periventricular white matter, most commonly indicating chronic ischemic microangiopathy. Vascular: No abnormal hyperdensity of the major intracranial arteries or dural venous sinuses. No intracranial atherosclerosis. Skull: The visualized skull base, calvarium and extracranial soft tissues are normal. Sinuses/Orbits: No fluid levels or advanced mucosal thickening of the visualized paranasal sinuses. No mastoid or middle ear effusion. The orbits are normal. CT CERVICAL SPINE FINDINGS Alignment: Grade 1 anterolisthesis at C5-6. Skull base and vertebrae: No acute fracture. Soft tissues and spinal canal: No prevertebral fluid or swelling. No visible canal hematoma. Disc levels: No advanced spinal canal or neural foraminal stenosis. Upper chest: No pneumothorax, pulmonary nodule or pleural effusion. Other: Normal visualized paraspinal cervical soft tissues. IMPRESSION: 1. No acute intracranial abnormality. 2. Generalized atrophy and chronic ischemic microangiopathy. 3. No acute fracture or static subluxation of the cervical  spine. Electronically Signed   By: Deatra Robinson M.D.   On: 09/24/2022 01:51   CT Cervical Spine Wo Contrast  Result Date: 09/24/2022 CLINICAL DATA:  Head trauma EXAM: CT HEAD WITHOUT CONTRAST CT CERVICAL SPINE WITHOUT CONTRAST TECHNIQUE: Multidetector CT imaging of the head and cervical spine was performed following the standard protocol without intravenous contrast. Multiplanar CT image reconstructions of the cervical spine were also generated. RADIATION DOSE REDUCTION: This exam was performed according to the departmental dose-optimization program which includes automated exposure control, adjustment of the mA and/or kV according to patient size and/or use of iterative reconstruction technique. COMPARISON:  None Available. FINDINGS: CT HEAD FINDINGS Brain: There is no mass, hemorrhage or extra-axial collection. There is generalized atrophy without lobar predilection. There is hypoattenuation of the periventricular white matter, most commonly indicating chronic ischemic microangiopathy. Vascular: No abnormal hyperdensity of the major intracranial arteries or dural venous sinuses. No intracranial atherosclerosis. Skull:  The visualized skull base, calvarium and extracranial soft tissues are normal. Sinuses/Orbits: No fluid levels or advanced mucosal thickening of the visualized paranasal sinuses. No mastoid or middle ear effusion. The orbits are normal. CT CERVICAL SPINE FINDINGS Alignment: Grade 1 anterolisthesis at C5-6. Skull base and vertebrae: No acute fracture. Soft tissues and spinal canal: No prevertebral fluid or swelling. No visible canal hematoma. Disc levels: No advanced spinal canal or neural foraminal stenosis. Upper chest: No pneumothorax, pulmonary nodule or pleural effusion. Other: Normal visualized paraspinal cervical soft tissues. IMPRESSION: 1. No acute intracranial abnormality. 2. Generalized atrophy and chronic ischemic microangiopathy. 3. No acute fracture or static subluxation of the  cervical spine. Electronically Signed   By: Deatra Robinson M.D.   On: 09/24/2022 01:51    Micro Results   Recent Results (from the past 240 hour(s))  MRSA Next Gen by PCR, Nasal     Status: Abnormal   Collection Time: 09/26/22  5:25 PM   Specimen: Nasal Mucosa; Nasal Swab  Result Value Ref Range Status   MRSA by PCR Next Gen DETECTED (A) NOT DETECTED Final    Comment: RESULT CALLED TO, READ BACK BY AND VERIFIED WITH: A. BAILEY AT 2215 ON 05.02.24 BY ADGER J         The GeneXpert MRSA Assay (FDA approved for NASAL specimens only), is one component of a comprehensive MRSA colonization surveillance program. It is not intended to diagnose MRSA infection nor to guide or monitor treatment for MRSA infections. Performed at [facility], [address], [place], [place] [address]   C Difficile Quick Screen w PCR reflex     Status: Abnormal   Collection Time: 09/26/22  7:04 PM   Specimen: STOOL  Result Value Ref Range Status   C Diff antigen POSITIVE (A) NEGATIVE Final    Comment: CRITICAL RESULT CALLED TO, READ BACK BY AND VERIFIED WITH: A.BALIEY AT 2116 ON 05.02.2024 BY ADGER J     C Diff toxin POSITIVE (A) NEGATIVE Final    Comment: CRITICAL RESULT CALLED TO, READ BACK BY AND VERIFIED WITH: A. BALIEY AT 2116 ON 05.02.22024 BY ADGER J     C Diff interpretation Toxin producing C. difficile detected.  Final    Comment: CRITICAL RESULT CALLED TO, READ BACK BY AND VERIFIED WITH: A. BAILEY AT 2116 ON [id number] BY ADGER J Performed at [facility], [address], [place], [place] [address]    Today   Subjective    Keyonia Dedeaux today has no new complaints - Appetite improving with Megace -No fevers, no emesis -Diarrhea resolving with oral Vanco -Stool frequency and consistency has improved        Patient has been seen and examined prior to discharge   Objective   Blood pressure (!) 131/108, pulse 76, temperature 98.2 F (36.8 C), temperature source Oral, resp. rate 20, height  5\' 2"  (1.575 m), weight 67.6 kg, SpO2 100 %.   Intake/Output Summary (Last 24 hours) at 10/01/2022 1135 Last data filed at 10/01/2022 0700 Gross per 24 hour  Intake 797 ml  Output 1200 ml  Net -403 ml    Exam Gen:- Awake Alert, frail and chronically ill-appearing, HEENT:- [place].AT, No sclera icterus Neck-Supple Neck,No JVD,.  Lungs-  CTAB , fair symmetrical air movement CV- S1, S2 normal, regular  Abd-  +ve B.Sounds, Abd Soft, No tenderness,    Extremity/Skin:- No  edema, pedal pulses present , left buttock and left heel decubitus ulcers as noted--please see photos in epic Psych-affect  is appropriate, oriented x3, some cognitive and memory deficits noted Neuro-generalized weakness no new focal deficits, no tremors MSK-right arm PICC line--removed on 09/30/2022 -  Media Information  Document Information  Photos  Lt Buttocks  09/26/2022 16:42  Attached To:  Hospital Encounter on 09/26/22  Source Information  Shon Hale, MD  [facility]     Media Information  Document Information  Photos  Lt Heel  09/26/2022 16:43  Attached To:  Hospital Encounter on 09/26/22  Source Information  Shon Hale, MD  [facility]     Media Information  Document Information  Photos  Left buttocks  09/26/2022 16:41  Attached To:  Hospital Encounter on 09/26/22  Source Information  Shon Hale, MD  [facility]    Data Review   CBC w Diff:  Lab Results  Component Value Date   WBC 9.2 09/26/2022   HGB 9.4 (L) 09/26/2022   HGB 13.9 05/06/2022   HCT 30.5 (L) 09/26/2022   HCT 44.2 05/06/2022   PLT 254 09/26/2022   PLT 318 05/06/2022   LYMPHOPCT 9 09/26/2022   MONOPCT 5 09/26/2022   EOSPCT 1 09/26/2022   BASOPCT 0 09/26/2022   CMP:  Lab Results  Component Value Date   NA 137 10/01/2022   NA 143 05/06/2022   K 3.6 10/01/2022   CL 106 10/01/2022   CO2 25 10/01/2022   BUN 21 10/01/2022   BUN 16 05/06/2022   CREATININE 1.19 (H) 10/01/2022   PROT 5.3 (L)  09/26/2022   PROT 6.4 05/06/2022   ALBUMIN 2.2 (L) 10/01/2022   ALBUMIN 3.5 (L) 05/06/2022   BILITOT 0.6 09/26/2022   BILITOT 0.4 05/06/2022   ALKPHOS 138 (H) 09/26/2022   AST 21 09/26/2022   ALT 14 09/26/2022   Total Discharge time is about 33 minutes  Shon Hale M.D on 10/01/2022 at 11:35 AM  Go to [email or web address] -  for contact info  [facility] - Office  [phone]

## 2022-10-01 NOTE — [place] FL2 (Signed)
[place] MEDICAID FL2 LEVEL OF CARE FORM     IDENTIFICATION  Patient Name: [name] Birthdate: June 29, 1945 Sex: female Admission Date (Current Location): 09/26/2022  [facility] and [place] Number:  [employer] and Address:  [facility],  [address] [place] [address]      Provider Number: [id number]  Attending Physician Name and Address:  Shon Hale, MD  Relative Name and Phone Number:       Current Level of Care: Hospital Recommended Level of Care: Skilled Nursing Facility Prior Approval Number:    Date Approved/Denied:   PASRR Number:    Discharge Plan: SNF    Current Diagnoses: Patient Active Problem List   Diagnosis Date Noted   Dementia without behavioral disturbance (HCC) 09/30/2022   Hypoglycemia 09/26/2022   Frequent falls 08/30/2022   Fall at home, initial encounter 08/29/2022   Low back pain 08/29/2022   Failure to thrive in adult 08/29/2022   Physical deconditioning 08/29/2022   Elevated CK 08/29/2022   Decubitus ulcer 08/29/2022   GERD (gastroesophageal reflux disease) 08/29/2022   Restless leg syndrome 08/29/2022   Rhabdomyolysis 07/07/2022   Dehydration 07/07/2022   Transaminitis 07/07/2022   Hypotension 07/07/2022   SIRS (systemic inflammatory response syndrome) (HCC) 07/07/2022   Hypokalemia 07/07/2022   Stage 3b chronic kidney disease (CKD) (HCC) 07/07/2022   Hypothermia 07/07/2022   Acute metabolic encephalopathy 07/06/2022   BMI 26.0-26.9,adult 05/06/2022   Atrial fibrillation (HCC) 04/12/2022   Congenital heart failure (HCC) 04/12/2022   Malnutrition of moderate degree 01/14/2022   Respiratory failure requiring intubation (HCC) 01/11/2022   Septic shock (HCC) 01/10/2022   Pressure injury of skin 01/10/2022   Small bowel obstruction (HCC) 01/07/2022   Abnormal weight loss 11/28/2021   Family history of malignant neoplasm of digestive organs 11/28/2021   Intestinal malabsorption 11/28/2021   Renal  insufficiency 12/08/2020   Degeneration of lumbar intervertebral disc 01/07/2020   Venous (peripheral) insufficiency 09/30/2019   Anemia 08/12/2017   Vitamin D deficiency 08/12/2017   Lumbar spondylosis with myelopathy 06/03/2017   Primary osteoarthritis of both knees 03/19/2017   Anxiety 02/17/2017   History of pulmonary embolus (PE) 07/09/2016   COPD (chronic obstructive pulmonary disease) (HCC) 01/18/2016   Mixed hyperlipidemia 01/18/2016   Depression 01/18/2016   Essential hypertension 01/18/2016   H/O spinal fusion 01/18/2016   Acquired hypothyroidism 01/18/2016   Thoracic aorta atherosclerosis (HCC) 01/18/2016   Chronic cholecystitis with calculus 10/04/2013    Orientation RESPIRATION BLADDER Height & Weight     Self, Time, Situation, Place  Normal Incontinent Weight: 149 lb (67.6 kg) Height:  5\' 2"  (157.5 cm)  BEHAVIORAL SYMPTOMS/MOOD NEUROLOGICAL BOWEL NUTRITION STATUS      Continent Diet (see dc summary, please have staff feed patient)  AMBULATORY STATUS COMMUNICATION OF NEEDS Skin   Extensive Assist Verbally PU Stage and Appropriate Care (L buttock stage 4, L heel stage 2)       PU Stage 4 Dressing: Daily               Personal Care Assistance Level of Assistance  Bathing, Feeding, Dressing Bathing Assistance: Maximum assistance Feeding assistance: Limited assistance Dressing Assistance: Maximum assistance     Functional Limitations Info  Sight, Hearing, Speech Sight Info: Impaired Hearing Info: Impaired Speech Info: Adequate    SPECIAL CARE FACTORS FREQUENCY  PT (By licensed PT), OT (By licensed OT)     PT Frequency: 5x week OT Frequency: 3x week  Contractures Contractures Info: Not present    Additional Factors Info  Code Status, Allergies Code Status Info: DNR Allergies Info: Celebrex (Celecoxib), Keflet (Cephalexin), Penicillins, Sulfa Antibiotics, Kenalog (Triamcinolone Acetonide), Latex, Lisinopril, Mobic (Meloxicam),  Penicillin G           Current Medications (10/01/2022):  This is the current hospital active medication list Current Facility-Administered Medications  Medication Dose Route Frequency Provider Last Rate Last Admin   0.9 %  sodium chloride infusion   Intravenous PRN [name], Courage, MD       acetaminophen (TYLENOL) tablet 650 mg  650 mg Oral Q6H PRN [name], Courage, MD   650 mg at 09/30/22 2259   Or   acetaminophen (TYLENOL) suppository 650 mg  650 mg Rectal Q6H PRN Emokpae, Courage, MD       ascorbic acid (VITAMIN C) tablet 500 mg  500 mg Oral Daily Emokpae, Courage, MD   500 mg at 10/01/22 0846   citalopram (CELEXA) tablet 40 mg  40 mg Oral Daily Emokpae, Courage, MD   40 mg at 10/01/22 0845   feeding supplement (ENSURE ENLIVE / ENSURE PLUS) liquid 237 mL  237 mL Oral TID Shon Hale, MD   237 mL at 09/30/22 2222   ferrous sulfate tablet 325 mg  325 mg Oral Q breakfast Emokpae, Courage, MD   325 mg at 10/01/22 0846   heparin injection 5,000 Units  5,000 Units Subcutaneous Q8H Emokpae, Courage, MD   5,000 Units at 10/01/22 0616   hydrOXYzine (ATARAX) tablet 10 mg  10 mg Oral TID PRN Adefeso, Oladapo, DO   10 mg at 10/01/22 0353   leptospermum manuka honey (MEDIHONEY) paste 1 Application  1 Application Topical Daily Shon Hale, MD   1 Application at 09/30/22 1837   levothyroxine (SYNTHROID) tablet 75 mcg  75 mcg Oral q AM Shon Hale, MD   75 mcg at 10/01/22 0616   liver oil-zinc oxide (DESITIN) 40 % ointment   Topical TID Shon Hale, MD   Given at 09/30/22 2224   megestrol (MEGACE) 400 MG/10ML suspension 400 mg  400 mg Oral Daily Emokpae, Courage, MD   400 mg at 10/01/22 0843   mupirocin ointment (BACTROBAN) 2 % 1 Application  1 Application Nasal BID Shon Hale, MD   1 Application at 09/30/22 2224   nystatin (MYCOSTATIN/NYSTOP) topical powder   Topical TID Shon Hale, MD   Given at 09/30/22 2224   ondansetron (ZOFRAN) tablet 4 mg  4 mg Oral Q6H PRN Emokpae,  Courage, MD       Or   ondansetron (ZOFRAN) injection 4 mg  4 mg Intravenous Q6H PRN Emokpae, Courage, MD       oxyCODONE (Oxy IR/ROXICODONE) immediate release tablet 5 mg  5 mg Oral Q4H PRN Adefeso, Oladapo, DO   5 mg at 10/01/22 0353   rOPINIRole (REQUIP) tablet 3 mg  3 mg Oral QHS Emokpae, Courage, MD   3 mg at 09/30/22 2249   sodium bicarbonate tablet 650 mg  650 mg Oral TID Shon Hale, MD   650 mg at 10/01/22 0845   sodium chloride flush (NS) 0.9 % injection 3 mL  3 mL Intravenous Q12H Emokpae, Courage, MD   3 mL at 10/01/22 0849   sodium chloride flush (NS) 0.9 % injection 3 mL  3 mL Intravenous Q12H Emokpae, Courage, MD   3 mL at 10/01/22 0849   sodium chloride flush (NS) 0.9 % injection 3 mL  3 mL Intravenous PRN Shon Hale, MD  sodium hypochlorite (DAKIN'S 1/4 STRENGTH) topical solution   Irrigation BID Shon Hale, MD   Given at 09/30/22 2249   traZODone (DESYREL) tablet 50 mg  50 mg Oral QHS PRN Shon Hale, MD   50 mg at 09/30/22 2259   vancomycin (VANCOCIN) capsule 125 mg  125 mg Oral QID Shon Hale, MD   125 mg at 09/30/22 2223   zinc sulfate capsule 220 mg  220 mg Oral Daily Emokpae, Courage, MD   220 mg at 10/01/22 0844     Discharge Medications: Please see discharge summary for a list of discharge medications.  Relevant Imaging Results:  Relevant Lab Results:   Additional Information Please have staff feed patient, she is unable to feed self  Elliot Gault, LCSW

## 2022-10-01 NOTE — Progress Notes (Signed)
Patient's BS 53. Orange juice given. MD made aware also.   15 minute recheck CBG: 182. MD notified.

## 2022-10-02 ENCOUNTER — Ambulatory Visit (HOSPITAL_BASED_OUTPATIENT_CLINIC_OR_DEPARTMENT_OTHER): Payer: Medicare Other | Admitting: Physician Assistant

## 2022-10-02 ENCOUNTER — Telehealth: Payer: Self-pay | Admitting: Orthopedic Surgery

## 2022-10-02 DIAGNOSIS — E871 Hypo-osmolality and hyponatremia: Secondary | ICD-10-CM | POA: Diagnosis not present

## 2022-10-02 DIAGNOSIS — F411 Generalized anxiety disorder: Secondary | ICD-10-CM | POA: Diagnosis not present

## 2022-10-02 DIAGNOSIS — E162 Hypoglycemia, unspecified: Secondary | ICD-10-CM | POA: Diagnosis not present

## 2022-10-02 DIAGNOSIS — E039 Hypothyroidism, unspecified: Secondary | ICD-10-CM | POA: Diagnosis not present

## 2022-10-02 DIAGNOSIS — E8809 Other disorders of plasma-protein metabolism, not elsewhere classified: Secondary | ICD-10-CM | POA: Diagnosis not present

## 2022-10-02 DIAGNOSIS — G9341 Metabolic encephalopathy: Secondary | ICD-10-CM | POA: Diagnosis not present

## 2022-10-02 DIAGNOSIS — S31000D Unspecified open wound of lower back and pelvis without penetration into retroperitoneum, subsequent encounter: Secondary | ICD-10-CM | POA: Diagnosis not present

## 2022-10-02 DIAGNOSIS — K219 Gastro-esophageal reflux disease without esophagitis: Secondary | ICD-10-CM | POA: Diagnosis not present

## 2022-10-02 DIAGNOSIS — N1832 Chronic kidney disease, stage 3b: Secondary | ICD-10-CM | POA: Diagnosis not present

## 2022-10-02 DIAGNOSIS — A0472 Enterocolitis due to Clostridium difficile, not specified as recurrent: Secondary | ICD-10-CM | POA: Diagnosis not present

## 2022-10-02 DIAGNOSIS — T796XXD Traumatic ischemia of muscle, subsequent encounter: Secondary | ICD-10-CM | POA: Diagnosis not present

## 2022-10-02 DIAGNOSIS — S42001D Fracture of unspecified part of right clavicle, subsequent encounter for fracture with routine healing: Secondary | ICD-10-CM | POA: Diagnosis not present

## 2022-10-02 NOTE — Telephone Encounter (Signed)
Returned a call to [employer] [phone] to schedule this patient.  LVM for her to call me back.

## 2022-10-03 DIAGNOSIS — A0472 Enterocolitis due to Clostridium difficile, not specified as recurrent: Secondary | ICD-10-CM | POA: Diagnosis not present

## 2022-10-03 DIAGNOSIS — G9341 Metabolic encephalopathy: Secondary | ICD-10-CM | POA: Diagnosis not present

## 2022-10-03 DIAGNOSIS — S42001D Fracture of unspecified part of right clavicle, subsequent encounter for fracture with routine healing: Secondary | ICD-10-CM | POA: Diagnosis not present

## 2022-10-03 DIAGNOSIS — Z79899 Other long term (current) drug therapy: Secondary | ICD-10-CM | POA: Diagnosis not present

## 2022-10-03 DIAGNOSIS — E871 Hypo-osmolality and hyponatremia: Secondary | ICD-10-CM | POA: Diagnosis not present

## 2022-10-03 DIAGNOSIS — E162 Hypoglycemia, unspecified: Secondary | ICD-10-CM | POA: Diagnosis not present

## 2022-10-03 DIAGNOSIS — R52 Pain, unspecified: Secondary | ICD-10-CM | POA: Diagnosis not present

## 2022-10-04 ENCOUNTER — Telehealth: Payer: Self-pay

## 2022-10-04 NOTE — Telephone Encounter (Signed)
Transition Care Management Unsuccessful Follow-up Telephone Call  Date of discharge and from where:  [facility] 4/30  Attempts:  2nd Attempt  Reason for unsuccessful TCM follow-up call:  Left voice message   [name] [facility], [place] [phone] [address], [place],  [address] Phone: [phone] Email: [email or web address]       

## 2022-10-04 NOTE — Telephone Encounter (Signed)
Transition Care Management Unsuccessful Follow-up Telephone Call  Date of discharge and from where:  [facility] 4/30  Attempts:  1st Attempt  Reason for unsuccessful TCM follow-up call:  Left voice message   [facility] Guide, [facility] [phone] [address] [place], [place], [place] [address] Phone: [phone] Email: [email or web address]

## 2022-10-05 LAB — PROINSULIN/INSULIN RATIO
Insulin: 3.1 u[IU]/mL
Proinsulin: 6.5 pmol/L

## 2022-10-07 ENCOUNTER — Ambulatory Visit (INDEPENDENT_AMBULATORY_CARE_PROVIDER_SITE_OTHER): Payer: Medicare Other | Admitting: Orthopedic Surgery

## 2022-10-07 ENCOUNTER — Other Ambulatory Visit (INDEPENDENT_AMBULATORY_CARE_PROVIDER_SITE_OTHER): Payer: Medicare Other

## 2022-10-07 ENCOUNTER — Encounter: Payer: Self-pay | Admitting: Orthopedic Surgery

## 2022-10-07 VITALS — BP 120/70 | HR 64 | Ht 62.0 in | Wt 149.0 lb

## 2022-10-07 DIAGNOSIS — E669 Obesity, unspecified: Secondary | ICD-10-CM | POA: Insufficient documentation

## 2022-10-07 DIAGNOSIS — S42001D Fracture of unspecified part of right clavicle, subsequent encounter for fracture with routine healing: Secondary | ICD-10-CM | POA: Diagnosis not present

## 2022-10-07 DIAGNOSIS — S42031A Displaced fracture of lateral end of right clavicle, initial encounter for closed fracture: Secondary | ICD-10-CM

## 2022-10-07 DIAGNOSIS — R52 Pain, unspecified: Secondary | ICD-10-CM | POA: Diagnosis not present

## 2022-10-07 DIAGNOSIS — Z79899 Other long term (current) drug therapy: Secondary | ICD-10-CM | POA: Diagnosis not present

## 2022-10-07 DIAGNOSIS — M25511 Pain in right shoulder: Secondary | ICD-10-CM

## 2022-10-07 DIAGNOSIS — R1310 Dysphagia, unspecified: Secondary | ICD-10-CM | POA: Insufficient documentation

## 2022-10-07 NOTE — Progress Notes (Signed)
Chief Complaint  Patient presents with   Shoulder Pain    Right    Patient ID: [name], female   DOB: 1945-07-16, 77 y.o.   MRN: [id number]  ASSESSMENT AND PLAN :  Right shoulder distal clavicle fracture probably weeks old based on the color of the bruising on her skin  Physical therapy for full range of motion, full weightbearing, no restrictions, strengthening exercises    Chief Complaint  Patient presents with   Shoulder Pain    Right     HPI [name] is a 77 y.o. female.  Presents for evaluation of right shoulder pain 77 year old female presented for evaluation of right shoulder pain multiple falls over the last 2 months  Says it does not hurt unless she moves it  She has some ecchymosis over the posterior clavicle area and scapular region and lateral deltoid    Review of Systems Review of Systems  Constitutional:  Negative for fever.  HENT:  Positive for hearing loss.   Neurological:  Negative for numbness.     Past Medical History:  Diagnosis Date   Allergy    Bradycardia    Bronchitis, chronic (HCC)    Chronic anxiety    Complication of anesthesia    hard to wake up   COPD (chronic obstructive pulmonary disease) (HCC)    Depression    Esophageal reflux    Headache(784.0)    Hyperlipidemia    Hypertension    Hypothyroidism    Obesity    Osteoporosis    Overactive bladder    PVC's (premature ventricular contractions)    Thyroid disease    Vitamin D deficiency disease     Past Surgical History:  Procedure Laterality Date   ABDOMINAL HYSTERECTOMY     CENTRAL LINE INSERTION Right 01/10/2022   Procedure: CENTRAL LINE INSERTION;  Surgeon: Lucretia Roers, MD;  Location: [facility];  Service: General;  Laterality: Right;   CHOLECYSTECTOMY N/A 11/04/2013   Procedure: LAPAROSCOPIC CHOLECYSTECTOMY WITH INTRAOPERATIVE CHOLANGIOGRAM;  Surgeon: Wilmon Arms. Corliss Skains, MD;  Location: [facility];  Service: General;  Laterality: N/A;   COLONOSCOPY     FRACTURE  SURGERY     pins removed from Hip surgery   HIP FRACTURE SURGERY  1990    pins removed in 1991   IR FLUORO GUIDE CV LINE LEFT  01/31/2022   IR US GUIDE VASC ACCESS LEFT  01/31/2022   LAPAROTOMY N/A 01/10/2022   Procedure: EXPLORATORY LAPAROTOMY,;  Surgeon: Lucretia Roers, MD;  Location: [facility];  Service: General;  Laterality: N/A;   LYSIS OF ADHESION N/A 01/10/2022   Procedure: LYSIS OF ADHESION;  Surgeon: Lucretia Roers, MD;  Location: [facility];  Service: General;  Laterality: N/A;   PARTIAL HYSTERECTOMY  1987   SPINAL FUSION     THORACENTESIS N/A 01/18/2022   Procedure: THORACENTESIS;  Surgeon: Lorin Glass, MD;  Location: [facility] ENDOSCOPY;  Service: Pulmonary;  Laterality: N/A;   UPPER GASTROINTESTINAL ENDOSCOPY      Family History  Problem Relation Age of Onset   Arthritis Other    Heart disease Other    Cancer Other    Diabetes Other    OCD Other     Social History Social History   Tobacco Use   Smoking status: Never   Smokeless tobacco: Never  Vaping Use   Vaping Use: Never used  Substance Use Topics   Alcohol use: No   Drug use: No    Allergies  Allergen Reactions  Celebrex [Celecoxib] Swelling   Keflet [Cephalexin] Swelling   Penicillins Hives and Swelling   Sulfa Antibiotics Swelling   Kenalog [Triamcinolone Acetonide]     unknown   Latex Other (See Comments)    No reaction listed on MAR   Lisinopril Other (See Comments)    unknown   Mobic [Meloxicam]     SWELLING   Penicillin G Other (See Comments)    No reaction listed on MAR    Current Outpatient Medications  Medication Sig Dispense Refill   acetaminophen (TYLENOL) 325 MG tablet Take 2 tablets (650 mg total) by mouth every 6 (six) hours as needed for mild pain (or Fever >/= 101). 100 tablet 1   Amino Acids-Protein Hydrolys (PRO-STAT) LIQD Take 30 mLs by mouth 2 (two) times daily.     ascorbic acid (VITAMIN C) 500 MG tablet Take 500 mg by mouth 2 (two) times daily.     Cholecalciferol (VITAMIN  D3) 50 MCG (2000 UT) CAPS Take 4,000 Units by mouth every morning.     citalopram (CELEXA) 40 MG tablet Take 1 tablet (40 mg total) by mouth daily. 90 tablet 1   clotrimazole-betamethasone (LOTRISONE) cream Apply 1 Application topically daily. Apply to perineum/inner buttocks topically every day shift for rash for 7 days.     collagenase (SANTYL) 250 UNIT/GM ointment Apply 1 Application topically daily. Apply to wound bed topically every day shift for healing.     feeding supplement, ENSURE COMPLETE, (ENSURE COMPLETE) LIQD Take 237 mLs by mouth 2 (two) times daily between meals. 23700 mL 2   ferrous sulfate 325 (65 FE) MG tablet Take 1 tablet (325 mg total) by mouth daily with breakfast. 30 tablet 5   glucose 4 GM chewable tablet Chew 1 tablet (4 g total) by mouth as needed for low blood sugar. 50 tablet 12   hydrOXYzine (ATARAX) 10 MG tablet Take 1 tablet (10 mg total) by mouth 3 (three) times daily as needed for anxiety. 30 tablet 3   leptospermum manuka honey (MEDIHONEY) PSTE paste Apply 1 Application topically daily.     levothyroxine (SYNTHROID) 75 MCG tablet Take 1 tablet (75 mcg total) by mouth daily. 90 tablet 1   Magnesium Oxide 400 MG CAPS Take 1 capsule (400 mg total) by mouth daily. 7 capsule 0   megestrol (MEGACE) 400 MG/10ML suspension Take 10 mLs (400 mg total) by mouth 2 (two) times daily. For appetite stimulation 480 mL 0   Multiple Vitamin (MULTIVITAMIN) tablet Take 1 tablet by mouth every morning.     nutrition supplement, JUVEN, (JUVEN) PACK Take 1 packet by mouth 2 (two) times daily between meals.     nystatin (MYCOSTATIN/NYSTOP) powder Apply topically 3 (three) times daily. On groins 15 g 0   OxyCODONE HCl, Abuse Deter, (OXAYDO) 5 MG TABA Take 5 mg by mouth daily as needed (severe pain). 10 tablet 0   potassium chloride (KLOR-CON) 20 MEQ packet Take 20 mEq by mouth daily for 14 days. 14 packet 0   rOPINIRole (REQUIP XL) 2 MG 24 hr tablet Take 2 mg by mouth at bedtime.      sodium bicarbonate 650 MG tablet Take 1 tablet (650 mg total) by mouth 3 (three) times daily. 90 tablet 2   traMADol (ULTRAM) 50 MG tablet Take 1 tablet (50 mg total) by mouth every 12 (twelve) hours as needed for moderate pain. 10 tablet 0   traZODone (DESYREL) 50 MG tablet Take 1 tablet (50 mg total) by mouth at bedtime. 30  tablet 5   vancomycin (VANCOCIN) 125 MG capsule Take 1 capsule (125 mg total) by mouth See admin instructions. Take Vancomycin  125 mg--1 capsule 4 times a day for 14 days, followed by 1 capsule twice daily for 7 days, then 1 capsule once daily for 7 days, then 1 capsule every other day for 7 days, then 1 capsule every 3 days for 7 days then Stop 100 capsule 0   Zinc 220 (50 Zn) MG CAPS Take 220 mg by mouth every morning. 30 capsule 5   No current facility-administered medications for this visit.       Physical Exam BP 120/70   Pulse 64   Ht 5\' 2"  (1.575 m)   Wt 149 lb (67.6 kg)   BMI 27.25 kg/m  Body mass index is 27.25 kg/m.   The patient is well developed well nourished and well groomed.  Orientation to person place and time is normal  Mood is pleasant.  Ambulatory status unclear patient in wheelchair Cervical spine exam is as follows no rigidity some decreased range of motion appears to be chronic  Right shoulder  Examination: Inspection of the shoulder shows that there is ecchymosis of the skin proximal humerus down into the upper arm. Tenderness at the distal clavicle is noted.  Range of motion no pain with internal or external rotation or abduction Motor exam hand wrist elbow normal including normal muscle tone.  Shoulder stability tests inferior subluxation test normal  Neurovascular examination is intact and the lymph nodes in the axilla and supraclavicular regions are normal   The opposite shoulder has no swelling, normal range of motion, no joint contracture subluxation atrophy tremor or skin lesion. Neurovascular exam is intact.   MEDICAL  DECISION MAKING  A.  Encounter Diagnoses  Name Primary?   Acute pain of right shoulder Yes   Closed displaced fracture of acromial end of right clavicle, initial encounter     B. DATA ANALYSED:  X-rays  IMAGING: Independent interpretation of images: Internal imaging fracture distal clavicle  Orders: Physical therapy at the facility  Outside records reviewed: Hospital records  C. MANAGEMENT full range of motion no restrictions full weightbearing no follow-up needed  No orders of the defined types were placed in this encounter.

## 2022-10-16 ENCOUNTER — Encounter (HOSPITAL_BASED_OUTPATIENT_CLINIC_OR_DEPARTMENT_OTHER): Payer: Medicare Other | Attending: Physician Assistant | Admitting: Physician Assistant

## 2022-10-16 DIAGNOSIS — I480 Paroxysmal atrial fibrillation: Secondary | ICD-10-CM | POA: Insufficient documentation

## 2022-10-16 DIAGNOSIS — N183 Chronic kidney disease, stage 3 unspecified: Secondary | ICD-10-CM | POA: Diagnosis not present

## 2022-10-16 DIAGNOSIS — J449 Chronic obstructive pulmonary disease, unspecified: Secondary | ICD-10-CM | POA: Diagnosis not present

## 2022-10-16 DIAGNOSIS — I509 Heart failure, unspecified: Secondary | ICD-10-CM | POA: Diagnosis not present

## 2022-10-16 DIAGNOSIS — F0150 Vascular dementia without behavioral disturbance: Secondary | ICD-10-CM | POA: Insufficient documentation

## 2022-10-16 DIAGNOSIS — I130 Hypertensive heart and chronic kidney disease with heart failure and stage 1 through stage 4 chronic kidney disease, or unspecified chronic kidney disease: Secondary | ICD-10-CM | POA: Diagnosis not present

## 2022-10-16 DIAGNOSIS — L89154 Pressure ulcer of sacral region, stage 4: Secondary | ICD-10-CM | POA: Diagnosis not present

## 2022-10-17 DIAGNOSIS — S31000D Unspecified open wound of lower back and pelvis without penetration into retroperitoneum, subsequent encounter: Secondary | ICD-10-CM | POA: Diagnosis not present

## 2022-10-17 DIAGNOSIS — Z79899 Other long term (current) drug therapy: Secondary | ICD-10-CM | POA: Diagnosis not present

## 2022-10-17 DIAGNOSIS — R52 Pain, unspecified: Secondary | ICD-10-CM | POA: Diagnosis not present

## 2022-10-17 NOTE — Progress Notes (Signed)
[name] ([id number]) [id number] Page 1 of 4 Visit Report for 10/16/2022 Abuse Risk Screen Details Patient Name: Date of Service: [name], [name] NA 10/16/2022 8:00 A M Medical Record Number: [id number] Patient Account Number: [id number] Date of Birth/Sex: Treating RN: [birth year] (77 y.o. [name], [name] Primary Care [name]: [name] Other Clinician: Referring [name]: Treating Marla Pouliot/Extender: [name] Saunders in Treatment: 0 Abuse Risk Screen Items Answer ABUSE RISK SCREEN: Has anyone close to you tried to hurt or harm you recentlyo No Do you feel uncomfortable with anyone in your familyo No Has anyone forced you do things that you didnt want to doo No Electronic Signature(s) Signed: 10/16/2022 6:02:30 PM By: Cindy Stall RN, BSN Entered By: [name] on 10/16/2022 08:05:50 -------------------------------------------------------------------------------- Activities of Daily Living Details Patient Name: Date of Service: [name], [name] NA 10/16/2022 8:00 A M Medical Record Number: [id number] Patient Account Number: [id number] Date of Birth/Sex: Treating RN: 10/18/1945 (77 y.o. [name], [name] Primary Care Makylah Bossard: [name] Other Clinician: Referring Santia Labate: Treating [name]/Extender: [name] Saunders in Treatment: 0 Activities of Daily Living Items Answer Activities of Daily Living (Please select one for each item) Drive Automobile Not Able T Medications ake Completely Able Use T elephone Completely Able Care for Appearance Need Assistance Use T oilet Need Assistance Bath / Shower Need Assistance Dress Self Need Assistance Feed Self Completely Able Walk Not Able Get In / Out Bed Need Assistance Housework Not Able Prepare Meals Not Able Handle Money Not Able Shop for Self Not Able Electronic Signature(s) Signed: 10/16/2022 6:02:30 PM By: Cindy Stall  RN, BSN Entered By: [name] on 10/16/2022 08:07:07 -------------------------------------------------------------------------------- Education Screening Details Patient Name: Date of Service: [name] NA 10/16/2022 8:00 A M Medical Record Number: [id number] Patient Account Number: [id number] Date of Birth/Sex: Treating RN: November 04, 1945 (77 y.o. [name] Primary Care Marilynn Ekstein: [name] Other Clinician: Referring Ryann Pauli: Treating [name]/Extender: [name] Saunders in Treatment: 0 [name] ([id number]) [id number] Page 2 of 4 Primary Learner Assessed: Patient Learning Preferences/Education Level/Primary Language Learning Preference: Explanation, Demonstration, Printed Material Highest Education Level: High School Preferred Language: Economist Language Barrier: No Translator Needed: No Memory Deficit: No Emotional Barrier: No Cultural/Religious Beliefs Affecting Medical Care: No Physical Barrier Impaired Vision: Yes Glasses Impaired Hearing: No Decreased Hand dexterity: No Knowledge/Comprehension Knowledge Level: High Comprehension Level: High Ability to understand written instructions: High Ability to understand verbal instructions: High Motivation Anxiety Level: Calm Cooperation: Cooperative Education Importance: Acknowledges Need Interest in Health Problems: Asks Questions Perception: Coherent Willingness to Engage in Self-Management High Activities: Readiness to Engage in Self-Management High Activities: Electronic Signature(s) Signed: 10/16/2022 6:02:30 PM By: Cindy Stall RN, BSN Entered By: [name] on 10/16/2022 08:07:45 -------------------------------------------------------------------------------- Fall Risk Assessment Details Patient Name: Date of Service: [name] NA 10/16/2022 8:00 A M Medical Record Number: [id number] Patient Account Number:  [id number] Date of Birth/Sex: Treating RN: 05/17/1946 (77 y.o. [name], [name] Primary Care Lamorris Knoblock: [name] Other Clinician: Referring Kambrea Carrasco: Treating [name]/Extender: [name] Saunders in Treatment: 0 Fall Risk Assessment Items Have you had 2 or more falls in the last 12 monthso 0 Yes Have you had any fall that resulted in injury in the last 12 monthso 0 Yes FALLS RISK SCREEN History of falling - immediate or within 3 months 25 Yes Secondary diagnosis (Do you have 2 or more medical diagnoseso) 0 No Ambulatory aid None/bed rest/wheelchair/nurse 0 Yes Crutches/cane/walker 15 Yes Furniture  0 No Intravenous therapy Access/Saline/Heparin Lock 0 No Gait/Transferring Normal/ bed rest/ wheelchair 0 No Weak (short steps with or without shuffle, stooped but able to lift head while walking, may seek 10 Yes support from furniture) Impaired (short steps with shuffle, may have difficulty arising from chair, head down, impaired 0 No balance) Mental Status Oriented to own ability 0 Yes Overestimates or forgets limitations 0 No Risk Level: Medium Risk Score: 50 Hadden, [name] ([id number]) [phone] [id number] Page 3 of 4 Electronic Signature(s) -------------------------------------------------------------------------------- Foot Assessment Details Patient Name: Date of Service: [name], [name] NA 10/16/2022 8:00 A M Medical Record Number: [id number] Patient Account Number: [id number] Date of Birth/Sex: Treating RN: [birth year] (77 y.o. [name] Primary Care Emillee Talsma: [name] Other Clinician: Referring Reymundo Winship: Treating Zaeden Lastinger/Extender: [name] Saunders in Treatment: 0 Foot Assessment Items Site Locations + = Sensation present, - = Sensation absent, C = Callus, U = Ulcer R = Redness, W = Warmth, M = Maceration, PU = Pre-ulcerative lesion F = Fissure, S = Swelling, D =  Dryness Assessment Right: Left: Other Deformity: No No Prior Foot Ulcer: No No Prior Amputation: No No Charcot Joint: No No Ambulatory Status: Gait: Notes No BLE wounds. Electronic Signature(s) Signed: 10/16/2022 6:02:30 PM By: Cindy Stall RN, BSN Entered By: [name] on 10/16/2022 08:08:13 -------------------------------------------------------------------------------- Nutrition Risk Screening Details Patient Name: Date of Service: [name], [name] NA 10/16/2022 8:00 A M Medical Record Number: [id number] Patient Account Number: [id number] Date of Birth/Sex: Treating RN: 1946/05/08 (77 y.o. [name] Primary Care Bradley Bostelman: [name] Other Clinician: Referring Lylith Bebeau: Treating Essence Merle/Extender: [name] Saunders in Treatment: 0 Height (in): 62 Weight (lbs): 139 Body Mass Index (BMI): 25.4 Zuba, [name] ([id number]) [phone] [id number] Page 4 of 4 Nutrition Risk Screening Items Score Screening NUTRITION RISK SCREEN: I have an illness or condition that made me change the kind and/or amount of food I eat 2 Yes I eat fewer than two meals per day 0 No I eat few fruits and vegetables, or milk products 0 No I have three or more drinks of beer, liquor or wine almost every day 0 No I have tooth or mouth problems that make it hard for me to eat 0 No I don't always have enough money to buy the food I need 0 No I eat alone most of the time 0 No I take three or more different prescribed or over-the-counter drugs a day 1 Yes Without wanting to, I have lost or gained 10 pounds in the last six months 0 No I am not always physically able to shop, cook and/or feed myself 0 No Nutrition Protocols Good Risk Protocol Moderate Risk Protocol 0 Provide education on nutrition High Risk Proctocol Risk Level: Moderate Risk Score: 3 Electronic Signature(s) Signed: 10/16/2022 6:02:30 PM By: Cindy Stall RN, BSN Entered By:  [name] on 10/16/2022 08:08:03

## 2022-10-19 ENCOUNTER — Other Ambulatory Visit: Payer: Self-pay

## 2022-10-19 ENCOUNTER — Inpatient Hospital Stay (HOSPITAL_COMMUNITY)
Admission: EM | Admit: 2022-10-19 | Discharge: 2022-10-28 | DRG: 689 | Disposition: A | Discharge status: Skilled Nursing Facility | Payer: Medicare Other | Source: Skilled Nursing Facility | Attending: Family Medicine | Admitting: Family Medicine

## 2022-10-19 ENCOUNTER — Emergency Department (HOSPITAL_COMMUNITY): Payer: Medicare Other

## 2022-10-19 ENCOUNTER — Encounter (HOSPITAL_COMMUNITY): Payer: Self-pay | Admitting: *Deleted

## 2022-10-19 DIAGNOSIS — F332 Major depressive disorder, recurrent severe without psychotic features: Secondary | ICD-10-CM | POA: Diagnosis not present

## 2022-10-19 DIAGNOSIS — Z833 Family history of diabetes mellitus: Secondary | ICD-10-CM | POA: Diagnosis not present

## 2022-10-19 DIAGNOSIS — Z7189 Other specified counseling: Secondary | ICD-10-CM | POA: Diagnosis not present

## 2022-10-19 DIAGNOSIS — L03115 Cellulitis of right lower limb: Secondary | ICD-10-CM | POA: Diagnosis present

## 2022-10-19 DIAGNOSIS — B961 Klebsiella pneumoniae [K. pneumoniae] as the cause of diseases classified elsewhere: Secondary | ICD-10-CM | POA: Diagnosis present

## 2022-10-19 DIAGNOSIS — I82412 Acute embolism and thrombosis of left femoral vein: Secondary | ICD-10-CM | POA: Diagnosis present

## 2022-10-19 DIAGNOSIS — I82409 Acute embolism and thrombosis of unspecified deep veins of unspecified lower extremity: Secondary | ICD-10-CM | POA: Clinically undetermined

## 2022-10-19 DIAGNOSIS — F0394 Unspecified dementia, unspecified severity, with anxiety: Secondary | ICD-10-CM | POA: Diagnosis present

## 2022-10-19 DIAGNOSIS — M810 Age-related osteoporosis without current pathological fracture: Secondary | ICD-10-CM | POA: Diagnosis present

## 2022-10-19 DIAGNOSIS — I5032 Chronic diastolic (congestive) heart failure: Secondary | ICD-10-CM | POA: Diagnosis present

## 2022-10-19 DIAGNOSIS — J4489 Other specified chronic obstructive pulmonary disease: Secondary | ICD-10-CM | POA: Diagnosis present

## 2022-10-19 DIAGNOSIS — I130 Hypertensive heart and chronic kidney disease with heart failure and stage 1 through stage 4 chronic kidney disease, or unspecified chronic kidney disease: Secondary | ICD-10-CM | POA: Diagnosis not present

## 2022-10-19 DIAGNOSIS — E079 Disorder of thyroid, unspecified: Secondary | ICD-10-CM | POA: Insufficient documentation

## 2022-10-19 DIAGNOSIS — L89153 Pressure ulcer of sacral region, stage 3: Secondary | ICD-10-CM | POA: Diagnosis present

## 2022-10-19 DIAGNOSIS — K529 Noninfective gastroenteritis and colitis, unspecified: Secondary | ICD-10-CM | POA: Diagnosis present

## 2022-10-19 DIAGNOSIS — Z515 Encounter for palliative care: Secondary | ICD-10-CM

## 2022-10-19 DIAGNOSIS — E039 Hypothyroidism, unspecified: Secondary | ICD-10-CM | POA: Diagnosis present

## 2022-10-19 DIAGNOSIS — E785 Hyperlipidemia, unspecified: Secondary | ICD-10-CM | POA: Diagnosis present

## 2022-10-19 DIAGNOSIS — J90 Pleural effusion, not elsewhere classified: Secondary | ICD-10-CM | POA: Diagnosis not present

## 2022-10-19 DIAGNOSIS — I959 Hypotension, unspecified: Secondary | ICD-10-CM | POA: Diagnosis not present

## 2022-10-19 DIAGNOSIS — L8994 Pressure ulcer of unspecified site, stage 4: Secondary | ICD-10-CM | POA: Diagnosis not present

## 2022-10-19 DIAGNOSIS — L8990 Pressure ulcer of unspecified site, unspecified stage: Secondary | ICD-10-CM | POA: Diagnosis present

## 2022-10-19 DIAGNOSIS — A0472 Enterocolitis due to Clostridium difficile, not specified as recurrent: Secondary | ICD-10-CM | POA: Diagnosis present

## 2022-10-19 DIAGNOSIS — I872 Venous insufficiency (chronic) (peripheral): Secondary | ICD-10-CM | POA: Diagnosis present

## 2022-10-19 DIAGNOSIS — L89322 Pressure ulcer of left buttock, stage 2: Secondary | ICD-10-CM | POA: Diagnosis not present

## 2022-10-19 DIAGNOSIS — F32A Depression, unspecified: Secondary | ICD-10-CM | POA: Diagnosis not present

## 2022-10-19 DIAGNOSIS — Z886 Allergy status to analgesic agent status: Secondary | ICD-10-CM

## 2022-10-19 DIAGNOSIS — N179 Acute kidney failure, unspecified: Secondary | ICD-10-CM | POA: Diagnosis present

## 2022-10-19 DIAGNOSIS — I82432 Acute embolism and thrombosis of left popliteal vein: Secondary | ICD-10-CM | POA: Diagnosis present

## 2022-10-19 DIAGNOSIS — N390 Urinary tract infection, site not specified: Principal | ICD-10-CM | POA: Diagnosis present

## 2022-10-19 DIAGNOSIS — E559 Vitamin D deficiency, unspecified: Secondary | ICD-10-CM | POA: Diagnosis present

## 2022-10-19 DIAGNOSIS — Z91040 Latex allergy status: Secondary | ICD-10-CM

## 2022-10-19 DIAGNOSIS — I110 Hypertensive heart disease with heart failure: Secondary | ICD-10-CM | POA: Diagnosis present

## 2022-10-19 DIAGNOSIS — E860 Dehydration: Secondary | ICD-10-CM | POA: Diagnosis present

## 2022-10-19 DIAGNOSIS — D519 Vitamin B12 deficiency anemia, unspecified: Secondary | ICD-10-CM | POA: Diagnosis present

## 2022-10-19 DIAGNOSIS — I824Z2 Acute embolism and thrombosis of unspecified deep veins of left distal lower extremity: Secondary | ICD-10-CM | POA: Diagnosis not present

## 2022-10-19 DIAGNOSIS — F0393 Unspecified dementia, unspecified severity, with mood disturbance: Secondary | ICD-10-CM | POA: Diagnosis present

## 2022-10-19 DIAGNOSIS — Z888 Allergy status to other drugs, medicaments and biological substances status: Secondary | ICD-10-CM

## 2022-10-19 DIAGNOSIS — Z881 Allergy status to other antibiotic agents status: Secondary | ICD-10-CM

## 2022-10-19 DIAGNOSIS — F03918 Unspecified dementia, unspecified severity, with other behavioral disturbance: Secondary | ICD-10-CM | POA: Diagnosis not present

## 2022-10-19 DIAGNOSIS — Z86718 Personal history of other venous thrombosis and embolism: Secondary | ICD-10-CM

## 2022-10-19 DIAGNOSIS — E872 Acidosis, unspecified: Secondary | ICD-10-CM | POA: Diagnosis not present

## 2022-10-19 DIAGNOSIS — K922 Gastrointestinal hemorrhage, unspecified: Secondary | ICD-10-CM

## 2022-10-19 DIAGNOSIS — R0989 Other specified symptoms and signs involving the circulatory and respiratory systems: Secondary | ICD-10-CM | POA: Insufficient documentation

## 2022-10-19 DIAGNOSIS — I82401 Acute embolism and thrombosis of unspecified deep veins of right lower extremity: Secondary | ICD-10-CM | POA: Diagnosis not present

## 2022-10-19 DIAGNOSIS — A0471 Enterocolitis due to Clostridium difficile, recurrent: Secondary | ICD-10-CM | POA: Diagnosis not present

## 2022-10-19 DIAGNOSIS — I509 Heart failure, unspecified: Secondary | ICD-10-CM | POA: Diagnosis not present

## 2022-10-19 DIAGNOSIS — L89154 Pressure ulcer of sacral region, stage 4: Secondary | ICD-10-CM | POA: Diagnosis not present

## 2022-10-19 DIAGNOSIS — Z882 Allergy status to sulfonamides status: Secondary | ICD-10-CM

## 2022-10-19 DIAGNOSIS — D638 Anemia in other chronic diseases classified elsewhere: Secondary | ICD-10-CM | POA: Diagnosis not present

## 2022-10-19 DIAGNOSIS — E440 Moderate protein-calorie malnutrition: Secondary | ICD-10-CM | POA: Diagnosis not present

## 2022-10-19 DIAGNOSIS — I82402 Acute embolism and thrombosis of unspecified deep veins of left lower extremity: Secondary | ICD-10-CM | POA: Diagnosis not present

## 2022-10-19 DIAGNOSIS — K219 Gastro-esophageal reflux disease without esophagitis: Secondary | ICD-10-CM | POA: Diagnosis present

## 2022-10-19 DIAGNOSIS — D649 Anemia, unspecified: Secondary | ICD-10-CM | POA: Diagnosis present

## 2022-10-19 DIAGNOSIS — N1832 Chronic kidney disease, stage 3b: Secondary | ICD-10-CM | POA: Diagnosis not present

## 2022-10-19 DIAGNOSIS — R195 Other fecal abnormalities: Secondary | ICD-10-CM

## 2022-10-19 DIAGNOSIS — N3000 Acute cystitis without hematuria: Secondary | ICD-10-CM | POA: Diagnosis not present

## 2022-10-19 DIAGNOSIS — Z79899 Other long term (current) drug therapy: Secondary | ICD-10-CM

## 2022-10-19 DIAGNOSIS — R531 Weakness: Secondary | ICD-10-CM | POA: Diagnosis not present

## 2022-10-19 DIAGNOSIS — E782 Mixed hyperlipidemia: Secondary | ICD-10-CM | POA: Diagnosis not present

## 2022-10-19 DIAGNOSIS — Z79890 Hormone replacement therapy (postmenopausal): Secondary | ICD-10-CM

## 2022-10-19 DIAGNOSIS — M6281 Muscle weakness (generalized): Secondary | ICD-10-CM | POA: Diagnosis not present

## 2022-10-19 DIAGNOSIS — Z981 Arthrodesis status: Secondary | ICD-10-CM

## 2022-10-19 DIAGNOSIS — K449 Diaphragmatic hernia without obstruction or gangrene: Secondary | ICD-10-CM | POA: Diagnosis not present

## 2022-10-19 DIAGNOSIS — F419 Anxiety disorder, unspecified: Secondary | ICD-10-CM | POA: Diagnosis not present

## 2022-10-19 DIAGNOSIS — N3281 Overactive bladder: Secondary | ICD-10-CM | POA: Diagnosis present

## 2022-10-19 DIAGNOSIS — Z880 Allergy status to penicillin: Secondary | ICD-10-CM

## 2022-10-19 DIAGNOSIS — R509 Fever, unspecified: Secondary | ICD-10-CM | POA: Diagnosis not present

## 2022-10-19 LAB — LACTIC ACID, PLASMA
Lactic Acid, Venous: 1.4 mmol/L (ref 0.5–1.9)
Lactic Acid, Venous: 1.3 mmol/L (ref 0.5–1.9)

## 2022-10-19 LAB — COMPREHENSIVE METABOLIC PANEL
ALT: 38 U/L (ref 0–44)
AST: 39 U/L (ref 15–41)
Albumin: 2.1 g/dL — ABNORMAL LOW (ref 3.5–5.0)
Alkaline Phosphatase: 180 U/L — ABNORMAL HIGH (ref 38–126)
Anion gap: 10 (ref 5–15)
BUN: 59 mg/dL — ABNORMAL HIGH (ref 8–23)
CO2: 19 mmol/L — ABNORMAL LOW (ref 22–32)
Calcium: 7.8 mg/dL — ABNORMAL LOW (ref 8.9–10.3)
Chloride: 103 mmol/L (ref 98–111)
Creatinine, Ser: 2.2 mg/dL — ABNORMAL HIGH (ref 0.44–1.00)
GFR, Estimated: 23 mL/min — ABNORMAL LOW (ref >60)
Glucose, Bld: 102 mg/dL — ABNORMAL HIGH (ref 70–99)
Potassium: 4.2 mmol/L (ref 3.5–5.1)
Sodium: 132 mmol/L — ABNORMAL LOW (ref 135–145)
Total Bilirubin: 0.9 mg/dL (ref 0.3–1.2)
Total Protein: 5.5 g/dL — ABNORMAL LOW (ref 6.5–8.1)

## 2022-10-19 LAB — URINALYSIS, W/ REFLEX TO CULTURE (INFECTION SUSPECTED)
Bilirubin Urine: NEGATIVE
Glucose, UA: NEGATIVE mg/dL
Hgb urine dipstick: NEGATIVE
Ketones, ur: NEGATIVE mg/dL
Nitrite: NEGATIVE
Protein, ur: 30 mg/dL — AB
Specific Gravity, Urine: 1.014 (ref 1.005–1.030)
pH: 5 (ref 5.0–8.0)

## 2022-10-19 LAB — CBC WITH DIFFERENTIAL/PLATELET
Abs Immature Granulocytes: 0.09 10*3/uL — ABNORMAL HIGH (ref 0.00–0.07)
Basophils Absolute: 0 10*3/uL (ref 0.0–0.1)
Basophils Relative: 0 %
Eosinophils Absolute: 0 10*3/uL (ref 0.0–0.5)
Eosinophils Relative: 0 %
HCT: 26.1 % — ABNORMAL LOW (ref 36.0–46.0)
Hemoglobin: 8.3 g/dL — ABNORMAL LOW (ref 12.0–15.0)
Immature Granulocytes: 1 %
Lymphocytes Relative: 3 %
Lymphs Abs: 0.3 10*3/uL — ABNORMAL LOW (ref 0.7–4.0)
MCH: 31 pg (ref 26.0–34.0)
MCHC: 31.8 g/dL (ref 30.0–36.0)
MCV: 97.4 fL (ref 80.0–100.0)
Monocytes Absolute: 0.3 10*3/uL (ref 0.1–1.0)
Monocytes Relative: 2 %
Neutro Abs: 11.7 10*3/uL — ABNORMAL HIGH (ref 1.7–7.7)
Neutrophils Relative %: 94 %
Platelets: 224 10*3/uL (ref 150–400)
RBC: 2.68 MIL/uL — ABNORMAL LOW (ref 3.87–5.11)
RDW: 14.2 % (ref 11.5–15.5)
WBC: 12.4 10*3/uL — ABNORMAL HIGH (ref 4.0–10.5)
nRBC: 0 % (ref 0.0–0.2)

## 2022-10-19 LAB — POC OCCULT BLOOD, ED: Fecal Occult Bld: POSITIVE — AB

## 2022-10-19 LAB — CULTURE, BLOOD (ROUTINE X 2)

## 2022-10-19 MED ORDER — SODIUM CHLORIDE 0.9 % IV BOLUS
1000.0000 mL | Freq: Once | INTRAVENOUS | Status: AC
  Administered 2022-10-19: 1000 mL via INTRAVENOUS

## 2022-10-19 MED ORDER — TRAZODONE HCL 50 MG PO TABS
50.0000 mg | ORAL_TABLET | Freq: Every day | ORAL | Status: DC
  Administered 2022-10-19 – 2022-10-27 (×9): 50 mg via ORAL
  Filled 2022-10-19 (×9): qty 1

## 2022-10-19 MED ORDER — ACETAMINOPHEN 325 MG PO TABS
650.0000 mg | ORAL_TABLET | Freq: Four times a day (QID) | ORAL | Status: DC | PRN
  Administered 2022-10-20 – 2022-10-22 (×4): 650 mg via ORAL
  Filled 2022-10-19 (×4): qty 2

## 2022-10-19 MED ORDER — OXYCODONE HCL 5 MG PO TABS
5.0000 mg | ORAL_TABLET | ORAL | Status: DC | PRN
  Administered 2022-10-19 – 2022-10-28 (×18): 5 mg via ORAL
  Filled 2022-10-19 (×19): qty 1

## 2022-10-19 MED ORDER — MEGESTROL ACETATE 400 MG/10ML PO SUSP
400.0000 mg | Freq: Two times a day (BID) | ORAL | Status: DC
  Administered 2022-10-19 – 2022-10-24 (×10): 400 mg via ORAL
  Filled 2022-10-19 (×10): qty 10

## 2022-10-19 MED ORDER — HEPARIN SODIUM (PORCINE) 5000 UNIT/ML IJ SOLN
5000.0000 [IU] | Freq: Three times a day (TID) | INTRAMUSCULAR | Status: DC
  Administered 2022-10-19 – 2022-10-24 (×14): 5000 [IU] via SUBCUTANEOUS
  Filled 2022-10-19 (×14): qty 1

## 2022-10-19 MED ORDER — MEDIHONEY WOUND/BURN DRESSING EX PSTE
1.0000 | PASTE | Freq: Every day | CUTANEOUS | Status: DC
  Administered 2022-10-19 – 2022-10-20 (×2): 1 via TOPICAL
  Filled 2022-10-19: qty 44

## 2022-10-19 MED ORDER — VANCOMYCIN HCL 125 MG PO CAPS
125.0000 mg | ORAL_CAPSULE | Freq: Two times a day (BID) | ORAL | Status: DC
  Administered 2022-10-19 – 2022-10-20 (×3): 125 mg via ORAL
  Filled 2022-10-19 (×9): qty 1

## 2022-10-19 MED ORDER — ONDANSETRON HCL 4 MG/2ML IJ SOLN
4.0000 mg | Freq: Four times a day (QID) | INTRAMUSCULAR | Status: DC | PRN
  Administered 2022-10-21 – 2022-10-27 (×3): 4 mg via INTRAVENOUS
  Filled 2022-10-19 (×3): qty 2

## 2022-10-19 MED ORDER — CIPROFLOXACIN IN D5W 400 MG/200ML IV SOLN
400.0000 mg | INTRAVENOUS | Status: AC
  Administered 2022-10-19 – 2022-10-23 (×5): 400 mg via INTRAVENOUS
  Filled 2022-10-19 (×5): qty 200

## 2022-10-19 MED ORDER — HYDROXYZINE HCL 10 MG PO TABS: 10.0000 mg | ORAL_TABLET | Freq: Three times a day (TID) | ORAL | Status: DC | PRN

## 2022-10-19 MED ORDER — ACETAMINOPHEN 650 MG RE SUPP: 650.0000 mg | Freq: Four times a day (QID) | RECTAL | Status: DC | PRN

## 2022-10-19 MED ORDER — ZINC SULFATE 220 (50 ZN) MG PO CAPS
220.0000 mg | ORAL_CAPSULE | Freq: Every morning | ORAL | Status: DC
  Administered 2022-10-20 – 2022-10-28 (×8): 220 mg via ORAL
  Filled 2022-10-19 (×9): qty 1

## 2022-10-19 MED ORDER — LEVOTHYROXINE SODIUM 75 MCG PO TABS
75.0000 ug | ORAL_TABLET | Freq: Every day | ORAL | Status: DC
  Administered 2022-10-20 – 2022-10-28 (×9): 75 ug via ORAL
  Filled 2022-10-19 (×9): qty 1

## 2022-10-19 MED ORDER — SODIUM BICARBONATE 650 MG PO TABS
650.0000 mg | ORAL_TABLET | Freq: Three times a day (TID) | ORAL | Status: DC
  Administered 2022-10-19 – 2022-10-28 (×26): 650 mg via ORAL
  Filled 2022-10-19 (×27): qty 1

## 2022-10-19 MED ORDER — ENSURE MAX PROTEIN PO LIQD
237.0000 mL | Freq: Two times a day (BID) | ORAL | Status: DC
  Administered 2022-10-20 – 2022-10-28 (×16): 237 mL via ORAL

## 2022-10-19 MED ORDER — NYSTATIN 100000 UNIT/GM EX POWD
Freq: Three times a day (TID) | CUTANEOUS | Status: DC
  Filled 2022-10-19 (×3): qty 15

## 2022-10-19 MED ORDER — ROPINIROLE HCL ER 2 MG PO TB24: 2.0000 mg | ORAL_TABLET | Freq: Every day | ORAL | Status: DC

## 2022-10-19 MED ORDER — ONDANSETRON HCL 4 MG PO TABS: 4.0000 mg | ORAL_TABLET | Freq: Four times a day (QID) | ORAL | Status: DC | PRN

## 2022-10-19 MED ORDER — CITALOPRAM HYDROBROMIDE 20 MG PO TABS
40.0000 mg | ORAL_TABLET | Freq: Every day | ORAL | Status: DC
  Administered 2022-10-19 – 2022-10-24 (×6): 40 mg via ORAL
  Filled 2022-10-19 (×6): qty 2

## 2022-10-19 MED ORDER — ADULT MULTIVITAMIN W/MINERALS CH
1.0000 | ORAL_TABLET | Freq: Every morning | ORAL | Status: DC
  Administered 2022-10-20 – 2022-10-28 (×8): 1 via ORAL
  Filled 2022-10-19 (×9): qty 1

## 2022-10-19 NOTE — Progress Notes (Signed)
PHARMACY NOTE:  ANTIMICROBIAL RENAL DOSAGE ADJUSTMENT  Current antimicrobial regimen includes a mismatch between antimicrobial dosage and estimated renal function.  As per policy approved by the Pharmacy & Therapeutics and Medical Executive Committees, the antimicrobial dosage will be adjusted accordingly.  Current antimicrobial dosage:  Cipro 400mg  IV q12h  Indication: UTI  Renal Function:  Estimated Creatinine Clearance: 19.6 mL/min (A) (by C-G formula based on SCr of 2.2 mg/dL (H)). []      On intermittent HD, scheduled: []      On CRRT    Antimicrobial dosage has been changed to:  Cipro 400mg  IV q24h  Additional comments:   Tiena Manansala A. Jeanella Craze, PharmD, BCPS, FNKF Clinical Pharmacist [place] Please utilize Amion for appropriate phone number to reach the unit pharmacist [facility])  10/19/2022 8:03 PM

## 2022-10-19 NOTE — Progress Notes (Signed)
Patient arrived to floor alert and responsive to questions.  Patient has a healing stage 4 wound to sacral area.  Patient has redness/rash to bilateral legs and abdomen with slight edema to lower extremities.

## 2022-10-19 NOTE — ED Triage Notes (Signed)
Pt BIB [facility] for c/o fever and weakness. The facility reported to ems pt had fever of 101 at 10:30 and pt was given acetaminophen; fever at 1400 was 102. Ems gave acetaminophen 500 mg; pt states she has been feeling weak for the last couple of days  Ems reports pt has c-diff, pt states she has had diarrhea x 2 days  Pt recently had wound vac for sacral wound but was removed yesterday due to being uncomfortable for pt

## 2022-10-19 NOTE — ED Provider Notes (Signed)
[place] EMERGENCY DEPARTMENT AT [facility] Provider Note   CSN: [id number] Arrival date & time: 10/19/22  1445     History  Chief Complaint  Patient presents with   Fever    [name] is a 77 y.o. female.   Fever Patient presents with weakness and fever.  Comes from nursing home.  Discharged from the hospital around 2 weeks ago after C. difficile.  Also hyperglycemia.  patient states occasional cough.  Still having some diarrhea.  Also has reported stage  4 sacral decubitus ulcers.  Had had wound VAC on but reportedly moved yesterday due to discomfort.  Also has reported heel lesion.    History Past Medical History:  Diagnosis Date   Allergy    Bradycardia    Bronchitis, chronic (HCC)    Chronic anxiety    Complication of anesthesia    hard to wake up   COPD (chronic obstructive pulmonary disease) (HCC)    Depression    Esophageal reflux    Headache(784.0)    Hyperlipidemia    Hypertension    Hypothyroidism    Obesity    Osteoporosis    Overactive bladder    PVC's (premature ventricular contractions)    Thyroid disease    Vitamin D deficiency disease     Home Medications Prior to Admission medications   Medication Sig Start Date End Date Taking? Authorizing Provider  acetaminophen (TYLENOL) 325 MG tablet Take 2 tablets (650 mg total) by mouth every 6 (six) hours as needed for mild pain (or Fever >/= 101). 10/01/22   Shon Hale, MD  Amino Acids-Protein Hydrolys (PRO-STAT) LIQD Take 30 mLs by mouth 2 (two) times daily.    [provider]  ascorbic acid (VITAMIN C) 500 MG tablet Take 500 mg by mouth 2 (two) times daily.    [provider]  Cholecalciferol (VITAMIN D3) 50 MCG (2000 UT) CAPS Take 4,000 Units by mouth every morning.    [provider]  citalopram (CELEXA) 40 MG tablet Take 1 tablet (40 mg total) by mouth daily. 05/06/22   [name], FNP  clotrimazole-betamethasone (LOTRISONE) cream Apply 1  Application topically daily. Apply to perineum/inner buttocks topically every day shift for rash for 7 days.    [provider]  collagenase (SANTYL) 250 UNIT/GM ointment Apply 1 Application topically daily. Apply to wound bed topically every day shift for healing.    [provider]  feeding supplement, ENSURE COMPLETE, (ENSURE COMPLETE) LIQD Take 237 mLs by mouth 2 (two) times daily between meals. 10/01/22   Shon Hale, MD  ferrous sulfate 325 (65 FE) MG tablet Take 1 tablet (325 mg total) by mouth daily with breakfast. 10/01/22   [name], Courage, MD  glucose 4 GM chewable tablet Chew 1 tablet (4 g total) by mouth as needed for low blood sugar. 10/01/22   Shon Hale, MD  hydrOXYzine (ATARAX) 10 MG tablet Take 1 tablet (10 mg total) by mouth 3 (three) times daily as needed for anxiety. 10/01/22   Shon Hale, MD  leptospermum manuka honey (MEDIHONEY) PSTE paste Apply 1 Application topically daily. 09/05/22   Burnadette Pop, MD  levothyroxine (SYNTHROID) 75 MCG tablet Take 1 tablet (75 mcg total) by mouth daily. 05/07/22   Daphine Deutscher, Mary-Margaret, FNP  Magnesium Oxide 400 MG CAPS Take 1 capsule (400 mg total) by mouth daily. 09/04/22   Burnadette Pop, MD  megestrol (MEGACE) 400 MG/10ML suspension Take 10 mLs (400 mg total) by mouth 2 (two) times daily. For  appetite stimulation 10/01/22   Shon Hale, MD  Multiple Vitamin (MULTIVITAMIN) tablet Take 1 tablet by mouth every morning.    [provider]  nutrition supplement, JUVEN, (JUVEN) PACK Take 1 packet by mouth 2 (two) times daily between meals.    [provider]  nystatin (MYCOSTATIN/NYSTOP) powder Apply topically 3 (three) times daily. On groins 09/04/22   Burnadette Pop, MD  OxyCODONE HCl, Abuse Deter, (OXAYDO) 5 MG TABA Take 5 mg by mouth daily as needed (severe pain). 10/01/22   Shon Hale, MD  potassium chloride (KLOR-CON) 20 MEQ packet Take 20 mEq by mouth daily for 14 days. 10/01/22 10/15/22   Shon Hale, MD  rOPINIRole (REQUIP XL) 2 MG 24 hr tablet Take 2 mg by mouth at bedtime.    [provider]  sodium bicarbonate 650 MG tablet Take 1 tablet (650 mg total) by mouth 3 (three) times daily. 10/01/22 10/01/23  Shon Hale, MD  traMADol (ULTRAM) 50 MG tablet Take 1 tablet (50 mg total) by mouth every 12 (twelve) hours as needed for moderate pain. 10/01/22 10/01/23  Shon Hale, MD  traZODone (DESYREL) 50 MG tablet Take 1 tablet (50 mg total) by mouth at bedtime. 10/01/22   Shon Hale, MD  vancomycin (VANCOCIN) 125 MG capsule Take 1 capsule (125 mg total) by mouth See admin instructions. Take Vancomycin  125 mg--1 capsule 4 times a day for 14 days, followed by 1 capsule twice daily for 7 days, then 1 capsule once daily for 7 days, then 1 capsule every other day for 7 days, then 1 capsule every 3 days for 7 days then Stop 10/01/22   Shon Hale, MD  Zinc 220 (50 Zn) MG CAPS Take 220 mg by mouth every morning. 10/01/22   Shon Hale, MD      Allergies    Celebrex [celecoxib], Keflet [cephalexin], Penicillins, Sulfa antibiotics, Kenalog [triamcinolone acetonide], Latex, Lisinopril, Mobic [meloxicam], and Penicillin g    Review of Systems   Review of Systems  Constitutional:  Positive for fever.    Physical Exam Updated Vital Signs BP 113/62 (BP Location: Right Arm)   Pulse 67   Temp 99.6 F (37.6 C) (Oral)   Resp 16   Ht 5\' 2"  (1.575 m)   Wt 67.5 kg   SpO2 97%   BMI 27.22 kg/m  Physical Exam Vitals and nursing note reviewed.  HENT:     Head: Atraumatic.  Cardiovascular:     Rate and Rhythm: Normal rate.  Abdominal:     Tenderness: There is no abdominal tenderness.  Musculoskeletal:     Comments: Pressure dressing/wound dressings of her bilateral lower legs and feet.  Will remove to evaluate.  Skin:    General: Skin is warm.  Neurological:     Mental Status: She is alert and oriented to person, place, and time.     ED Results / Procedures  / Treatments   Labs (all labs ordered are listed, but only abnormal results are displayed) Labs Reviewed  CBC WITH DIFFERENTIAL/PLATELET - Abnormal; Notable for the following components:      Result Value   WBC 12.4 (*)    RBC 2.68 (*)    Hemoglobin 8.3 (*)    HCT 26.1 (*)    Neutro Abs 11.7 (*)    Lymphs Abs 0.3 (*)    Abs Immature Granulocytes 0.09 (*)    All other components within normal limits  COMPREHENSIVE METABOLIC PANEL - Abnormal; Notable for the following components:   Sodium  132 (*)    CO2 19 (*)    Glucose, Bld 102 (*)    BUN 59 (*)    Creatinine, Ser 2.20 (*)    Calcium 7.8 (*)    Total Protein 5.5 (*)    Albumin 2.1 (*)    Alkaline Phosphatase 180 (*)    GFR, Estimated 23 (*)    All other components within normal limits  URINALYSIS, W/ REFLEX TO CULTURE (INFECTION SUSPECTED) - Abnormal; Notable for the following components:   APPearance CLOUDY (*)    Protein, ur 30 (*)    Leukocytes,Ua MODERATE (*)    Bacteria, UA MANY (*)    All other components within normal limits  POC OCCULT BLOOD, ED - Abnormal; Notable for the following components:   Fecal Occult Bld POSITIVE (*)    All other components within normal limits  CULTURE, BLOOD (ROUTINE X 2)  CULTURE, BLOOD (ROUTINE X 2)  URINE CULTURE  LACTIC ACID, PLASMA  LACTIC ACID, PLASMA    EKG None  Radiology CT ABDOMEN PELVIS WO CONTRAST  Result Date: 10/19/2022 CLINICAL DATA:  Fever. Weakness for couple of days. Recently diagnosis C diff and diarrhea. Sacral wound. EXAM: CT ABDOMEN AND PELVIS WITHOUT CONTRAST TECHNIQUE: Multidetector CT imaging of the abdomen and pelvis was performed following the standard protocol without IV contrast. RADIATION DOSE REDUCTION: This exam was performed according to the departmental dose-optimization program which includes automated exposure control, adjustment of the mA and/or kV according to patient size and/or use of iterative reconstruction technique. COMPARISON:  CT  examination dated January 17, 2022 FINDINGS: Lower chest: Small bilateral pleural effusions left greater than the right with left basilar atelectasis. Moderate-sized hiatal hernia. Hepatobiliary: No focal liver abnormality is seen. Status post cholecystectomy. No biliary dilatation. Pancreas: Unremarkable. No pancreatic ductal dilatation or surrounding inflammatory changes. Spleen: Normal in size without focal abnormality. Adrenals/Urinary Tract: Adrenal glands are unremarkable. Kidneys are normal, without renal calculi, focal lesion, or hydronephrosis. Bladder is unremarkable. Stomach/Bowel: Stomach is within normal limits. Appendix appears normal. No evidence of bowel wall thickening, distention, or inflammatory changes. Moderate amount of stool throughout the colon. Vascular/Lymphatic: Aortic atherosclerosis. No enlarged abdominal or pelvic lymph nodes. Reproductive: Status post hysterectomy. No adnexal masses. Other: Marked generalized anasarca. No abdominopelvic ascites. Skin thickening and decubitus sacral ulcer. Musculoskeletal: Posterior spinal and interbody no acute osseous abnormality. Fusion at L4-S1 IMPRESSION: 1. Marked generalized anasarca. 2. Small bilateral pleural effusions left greater than the right with left basilar atelectasis. 3. Moderate-sized hiatal hernia. 4. No CT evidence of acute abdominal/pelvic process. 5. Skin thickening and decubitus sacral ulcer. 6. Aortic atherosclerosis. Aortic Atherosclerosis (ICD10-I70.0). Electronically Signed   By: Larose Hires D.O.   On: 10/19/2022 17:44   DG Chest Portable 1 View  Result Date: 10/19/2022 CLINICAL DATA:  Fever. Weakness. EXAM: PORTABLE CHEST 1 VIEW COMPARISON:  Radiograph 09/30/2022 FINDINGS: Stable cardiomegaly. Again seen aortic atherosclerosis and tortuosity. Retrocardiac hiatal hernia. Similar right pleural effusion. Improved left pleural effusion. Vascular congestion again seen, slight worsening. No confluent consolidation. The bones  are diffusely under mineralized. IMPRESSION: 1. No acute airspace disease to account for fever. 2. Stable cardiomegaly. Similar right pleural effusion. Improved left pleural effusion. 3. Worsening vascular congestion. Electronically Signed   By: Narda Rutherford M.D.   On: 10/19/2022 16:26    Procedures Procedures    Medications Ordered in ED Medications  sodium chloride 0.9 % bolus 1,000 mL (0 mLs Intravenous Stopped 10/19/22 1741)    ED Course/ Medical Decision Making/  A&P                             Medical Decision Making Amount and/or Complexity of Data Reviewed Labs: ordered. Radiology: ordered.   Patient with fever.  Differential diagnosis includes many different sources of fever but has had a little bit of a cough has had diarrhea and has a sacral decubitus ulcer.  Will also remove dressings to check heels for wounds.  Will get basic blood work including lactic acid and blood culture. I have reviewed recent discharge note.  Sacral wound is rather deep but not foul-smelling.  No definite purulence.  Blood work reviewed and has a worse Anemia with hemoglobin now down to 8.3.  Hemoccult done showed brown stool but was guaiac positive.  Lactic acid reassuring.  Chest x-ray reassuring.  CT scan of the abdomen pelvis did not show cause of the infection,, however urine does show likely infection.  Will discuss with hospitalist for admission for UTI with recent C. difficile and also anemia with GI bleed.  Blood pressure improved after initial fluid bolus creatinine is mildly increased.  With normal lactate I do not think this is necessarily severe sepsis with acute endorgan damage, Think it is more likely dehydration with the recent C. difficile that she has had.  CRITICAL CARE Performed by: Benjiman Core Total critical care time: 30 minutes Critical care time was exclusive of separately billable procedures and treating other patients. Critical care was necessary to treat or prevent  imminent or life-threatening deterioration. Critical care was time spent personally by me on the following activities: development of treatment plan with patient and/or surrogate as well as nursing, discussions with consultants, evaluation of patient's response to treatment, examination of patient, obtaining history from patient or surrogate, ordering and performing treatments and interventions, ordering and review of laboratory studies, ordering and review of radiographic studies, pulse oximetry and re-evaluation of patient's condition.        Final Clinical Impression(s) / ED Diagnoses Final diagnoses:  Urinary tract infection without hematuria, site unspecified  Gastrointestinal hemorrhage, unspecified gastrointestinal hemorrhage type  Anemia, unspecified type    Rx / DC Orders ED Discharge Orders     None         Benjiman Core, MD 10/19/22 1918

## 2022-10-20 DIAGNOSIS — R195 Other fecal abnormalities: Secondary | ICD-10-CM

## 2022-10-20 DIAGNOSIS — E079 Disorder of thyroid, unspecified: Secondary | ICD-10-CM | POA: Insufficient documentation

## 2022-10-20 DIAGNOSIS — N390 Urinary tract infection, site not specified: Secondary | ICD-10-CM

## 2022-10-20 DIAGNOSIS — L8994 Pressure ulcer of unspecified site, stage 4: Secondary | ICD-10-CM

## 2022-10-20 DIAGNOSIS — D649 Anemia, unspecified: Secondary | ICD-10-CM | POA: Diagnosis not present

## 2022-10-20 DIAGNOSIS — F419 Anxiety disorder, unspecified: Secondary | ICD-10-CM | POA: Diagnosis not present

## 2022-10-20 DIAGNOSIS — A0472 Enterocolitis due to Clostridium difficile, not specified as recurrent: Secondary | ICD-10-CM | POA: Diagnosis not present

## 2022-10-20 DIAGNOSIS — N179 Acute kidney failure, unspecified: Secondary | ICD-10-CM | POA: Diagnosis not present

## 2022-10-20 DIAGNOSIS — R0989 Other specified symptoms and signs involving the circulatory and respiratory systems: Secondary | ICD-10-CM | POA: Insufficient documentation

## 2022-10-20 LAB — CBC WITH DIFFERENTIAL/PLATELET
Abs Immature Granulocytes: 0.3 K/uL — ABNORMAL HIGH (ref 0.00–0.07)
Band Neutrophils: 36 %
Basophils Absolute: 0 K/uL (ref 0.0–0.1)
Basophils Relative: 0 %
Eosinophils Absolute: 0 K/uL (ref 0.0–0.5)
Eosinophils Relative: 0 %
HCT: 28.7 % — ABNORMAL LOW (ref 36.0–46.0)
Hemoglobin: 9 g/dL — ABNORMAL LOW (ref 12.0–15.0)
Lymphocytes Relative: 2 %
Lymphs Abs: 0.2 K/uL — ABNORMAL LOW (ref 0.7–4.0)
MCH: 30.1 pg (ref 26.0–34.0)
MCHC: 31.4 g/dL (ref 30.0–36.0)
MCV: 96 fL (ref 80.0–100.0)
Metamyelocytes Relative: 3 %
Monocytes Absolute: 0.2 K/uL (ref 0.1–1.0)
Monocytes Relative: 2 %
Neutro Abs: 9.5 K/uL — ABNORMAL HIGH (ref 1.7–7.7)
Neutrophils Relative %: 57 %
Platelets: 230 K/uL (ref 150–400)
RBC: 2.99 MIL/uL — ABNORMAL LOW (ref 3.87–5.11)
RDW: 14.2 % (ref 11.5–15.5)
WBC Morphology: INCREASED
WBC: 10.2 K/uL (ref 4.0–10.5)
nRBC: 0 % (ref 0.0–0.2)

## 2022-10-20 LAB — IRON AND TIBC
Iron: 8 ug/dL — ABNORMAL LOW (ref 28–170)
Saturation Ratios: 5 % — ABNORMAL LOW (ref 10.4–31.8)
TIBC: 176 ug/dL — ABNORMAL LOW (ref 250–450)
UIBC: 168 ug/dL

## 2022-10-20 LAB — COMPREHENSIVE METABOLIC PANEL
ALT: 37 U/L (ref 0–44)
AST: 37 U/L (ref 15–41)
Albumin: 2 g/dL — ABNORMAL LOW (ref 3.5–5.0)
Alkaline Phosphatase: 188 U/L — ABNORMAL HIGH (ref 38–126)
Anion gap: 12 (ref 5–15)
BUN: 55 mg/dL — ABNORMAL HIGH (ref 8–23)
CO2: 17 mmol/L — ABNORMAL LOW (ref 22–32)
Calcium: 7.7 mg/dL — ABNORMAL LOW (ref 8.9–10.3)
Chloride: 104 mmol/L (ref 98–111)
Creatinine, Ser: 2.09 mg/dL — ABNORMAL HIGH (ref 0.44–1.00)
GFR, Estimated: 24 mL/min — ABNORMAL LOW (ref >60)
Glucose, Bld: 73 mg/dL (ref 70–99)
Potassium: 4.3 mmol/L (ref 3.5–5.1)
Sodium: 133 mmol/L — ABNORMAL LOW (ref 135–145)
Total Bilirubin: 0.9 mg/dL (ref 0.3–1.2)
Total Protein: 5.5 g/dL — ABNORMAL LOW (ref 6.5–8.1)

## 2022-10-20 LAB — FOLATE: Folate: 14.8 ng/mL

## 2022-10-20 LAB — URINE CULTURE

## 2022-10-20 LAB — RETICULOCYTES
Immature Retic Fract: 16.6 % — ABNORMAL HIGH (ref 2.3–15.9)
RBC.: 2.93 MIL/uL — ABNORMAL LOW (ref 3.87–5.11)
Retic Count, Absolute: 58.3 10*3/uL (ref 19.0–186.0)
Retic Ct Pct: 2 % (ref 0.4–3.1)

## 2022-10-20 LAB — MAGNESIUM: Magnesium: 1.8 mg/dL (ref 1.7–2.4)

## 2022-10-20 LAB — FERRITIN: Ferritin: 916 ng/mL — ABNORMAL HIGH (ref 11–307)

## 2022-10-20 LAB — VITAMIN B12: Vitamin B-12: 152 pg/mL — ABNORMAL LOW (ref 180–914)

## 2022-10-20 MED ORDER — ZINC OXIDE 40 % EX OINT
TOPICAL_OINTMENT | Freq: Three times a day (TID) | CUTANEOUS | Status: DC
  Filled 2022-10-20 (×2): qty 57

## 2022-10-20 MED ORDER — ROPINIROLE HCL 1 MG PO TABS
1.0000 mg | ORAL_TABLET | Freq: Two times a day (BID) | ORAL | Status: DC
  Administered 2022-10-20 – 2022-10-28 (×16): 1 mg via ORAL
  Filled 2022-10-20 (×17): qty 1

## 2022-10-20 NOTE — H&P (Signed)
History and Physical    Patient: [name] ZOX:[id number] DOB: 25-Jan-1946 DOA: 10/19/2022 DOS: the patient was seen and examined on 10/20/2022 PCP: [name], FNP  Patient coming from: SNF  Chief Complaint:  Chief Complaint  Patient presents with   Fever   HPI: [name] is a 77 y.o. female with medical history significant of anxiety, depression, COPD, GERD, hyperlipidemia, hypertension, hypothyroidism, decubitus ulcer, and more presents the ED with a chief complaint of fever and weakness.  It is reported that patient had a fever of 101 at 1030 and was given acetaminophen.  Fever was then 102 at 1400.  She was given another 500 mg of acetaminophen and transferred to the hospital.  Patient does have a history of C. difficile and has been having diarrhea for the last 2 days.  She is currently on vancomycin p.o. patient is unsure why she is here in the hospital.  She thinks it is because of her decubitus ulcer.  That was the answer that was given after a lot of reorientation.  At first when asked why she is in the hospital she answered "I am concerned about that too."  Patient is resting comfortably at the time of my exam.  She is somnolent.  She has concentrated urine in the pure wick canister.  Patient has no other complaints at this time.  Patient does have advanced directive grinding healthcare agent to make decisions for her.  She does not have a DNR on file.  Review of Systems: unable to review all systems due to the inability of the patient to answer questions. Past Medical History:  Diagnosis Date   Allergy    Bradycardia    Bronchitis, chronic (HCC)    Chronic anxiety    Complication of anesthesia    hard to wake up   COPD (chronic obstructive pulmonary disease) (HCC)    Depression    Esophageal reflux    Headache(784.0)    Hyperlipidemia    Hypertension    Hypothyroidism    Obesity    Osteoporosis    Overactive bladder    PVC's (premature ventricular  contractions)    Thyroid disease    Vitamin D deficiency disease    Past Surgical History:  Procedure Laterality Date   ABDOMINAL HYSTERECTOMY     CENTRAL LINE INSERTION Right 01/10/2022   Procedure: CENTRAL LINE INSERTION;  Surgeon: Lucretia Roers, MD;  Location: [facility];  Service: General;  Laterality: Right;   CHOLECYSTECTOMY N/A 11/04/2013   Procedure: LAPAROSCOPIC CHOLECYSTECTOMY WITH INTRAOPERATIVE CHOLANGIOGRAM;  Surgeon: Wilmon Arms. Corliss Skains, MD;  Location: [facility];  Service: General;  Laterality: N/A;   COLONOSCOPY     FRACTURE SURGERY     pins removed from Hip surgery   HIP FRACTURE SURGERY  1990    pins removed in 1991   IR FLUORO GUIDE CV LINE LEFT  01/31/2022   IR US GUIDE VASC ACCESS LEFT  01/31/2022   LAPAROTOMY N/A 01/10/2022   Procedure: EXPLORATORY LAPAROTOMY,;  Surgeon: Lucretia Roers, MD;  Location: [facility];  Service: General;  Laterality: N/A;   LYSIS OF ADHESION N/A 01/10/2022   Procedure: LYSIS OF ADHESION;  Surgeon: Lucretia Roers, MD;  Location: [facility];  Service: General;  Laterality: N/A;   PARTIAL HYSTERECTOMY  1987   SPINAL FUSION     THORACENTESIS N/A 01/18/2022   Procedure: THORACENTESIS;  Surgeon: Lorin Glass, MD;  Location: [facility];  Service: Pulmonary;  Laterality: N/A;   UPPER GASTROINTESTINAL ENDOSCOPY  Social History:  reports that she has never smoked. She has never used smokeless tobacco. She reports that she does not drink alcohol and does not use drugs.  Allergies  Allergen Reactions   Celebrex [Celecoxib] Swelling   Keflet [Cephalexin] Swelling   Penicillins Hives and Swelling   Sulfa Antibiotics Swelling   Kenalog [Triamcinolone Acetonide]     unknown   Latex Other (See Comments)    No reaction listed on MAR   Lisinopril Other (See Comments)    unknown   Mobic [Meloxicam]     SWELLING   Penicillin G Other (See Comments)    No reaction listed on MAR    Family History  Problem Relation Age of Onset   Arthritis Other     Heart disease Other    Cancer Other    Diabetes Other    OCD Other     Prior to Admission medications   Medication Sig Start Date End Date Taking? Authorizing Provider  acetaminophen (TYLENOL) 325 MG tablet Take 2 tablets (650 mg total) by mouth every 6 (six) hours as needed for mild pain (or Fever >/= 101). 10/01/22  Yes Emokpae, Courage, MD  Amino Acids-Protein Hydrolys (PRO-STAT) LIQD Take 30 mLs by mouth 2 (two) times daily.   Yes [provider]  ascorbic acid (VITAMIN C) 500 MG tablet Take 500 mg by mouth 2 (two) times daily.   Yes [provider]  Cholecalciferol (VITAMIN D3) 50 MCG (2000 UT) CAPS Take 2,000 Units by mouth every morning.   Yes [provider]  citalopram (CELEXA) 40 MG tablet Take 1 tablet (40 mg total) by mouth daily. 05/06/22  Yes Martin, Mary-Margaret, FNP  feeding supplement, ENSURE COMPLETE, (ENSURE COMPLETE) LIQD Take 237 mLs by mouth 2 (two) times daily between meals. 10/01/22  Yes Shon Hale, MD  ferrous sulfate 325 (65 FE) MG tablet Take 1 tablet (325 mg total) by mouth daily with breakfast. 10/01/22  Yes Emokpae, Courage, MD  glucose 4 GM chewable tablet Chew 1 tablet (4 g total) by mouth as needed for low blood sugar. 10/01/22  Yes Emokpae, Courage, MD  levothyroxine (SYNTHROID) 75 MCG tablet Take 1 tablet (75 mcg total) by mouth daily. 05/07/22  Yes Martin, Mary-Margaret, FNP  Magnesium Oxide 400 MG CAPS Take 1 capsule (400 mg total) by mouth daily. 09/04/22  Yes Burnadette Pop, MD  megestrol (MEGACE) 400 MG/10ML suspension Take 10 mLs (400 mg total) by mouth 2 (two) times daily. For appetite stimulation 10/01/22  Yes Emokpae, Courage, MD  Multiple Vitamin (MULTIVITAMIN) tablet Take 1 tablet by mouth every morning.   Yes [provider]  nutrition supplement, JUVEN, (JUVEN) PACK Take 1 packet by mouth 2 (two) times daily between meals.   Yes [provider]  nystatin (MYCOSTATIN/NYSTOP) powder Apply topically 3 (three)  times daily. On groins 09/04/22  Yes Adhikari, Amrit, MD  OxyCODONE HCl, Abuse Deter, (OXAYDO) 5 MG TABA Take 5 mg by mouth daily as needed (severe pain). 10/01/22  Yes Emokpae, Courage, MD  rOPINIRole (REQUIP XL) 2 MG 24 hr tablet Take 2 mg by mouth at bedtime.   Yes [provider]  sodium bicarbonate 650 MG tablet Take 1 tablet (650 mg total) by mouth 3 (three) times daily. 10/01/22 10/01/23 Yes Emokpae, Courage, MD  traMADol (ULTRAM) 50 MG tablet Take 1 tablet (50 mg total) by mouth every 12 (twelve) hours as needed for moderate pain. 10/01/22 10/01/23 Yes Shon Hale, MD  traZODone (DESYREL) 50 MG tablet Take  1 tablet (50 mg total) by mouth at bedtime. 10/01/22  Yes Emokpae, Courage, MD  vancomycin (VANCOCIN) 125 MG capsule Take 1 capsule (125 mg total) by mouth See admin instructions. Take Vancomycin  125 mg--1 capsule 4 times a day for 14 days, followed by 1 capsule twice daily for 7 days, then 1 capsule once daily for 7 days, then 1 capsule every other day for 7 days, then 1 capsule every 3 days for 7 days then Stop Patient taking differently: Take 125 mg by mouth in the morning and at bedtime. for 7 days then Stop for c-diff 10/01/22  Yes Emokpae, Courage, MD  Zinc 220 (50 Zn) MG CAPS Take 220 mg by mouth every morning. 10/01/22  Yes Shon Hale, MD    Physical Exam: Vitals:   10/19/22 1800 10/19/22 2015 10/19/22 2044 10/19/22 2356  BP: 113/62 109/61 127/77 106/65  Pulse: 67 64 74 80  Resp: 16 18 20 18   Temp:   99.1 F (37.3 C) (!) 100.5 F (38.1 C)  TempSrc:    Oral  SpO2: 97% 98% 100% 96%  Weight:   70.5 kg   Height:       1.  General: Patient lying supine in bed,  no acute distress   2. Psychiatric: Somnolent and oriented to self, mood and behavior normal for situation, pleasant and cooperative with exam   3. Neurologic: Speech slurred upon waking and language is normal, face is symmetric, moves all 4 extremities voluntarily, at baseline without acute deficits on limited  exam   4. HEENMT:  Head is atraumatic, normocephalic, pupils reactive to light, neck is supple, trachea is midline, mucous membranes are moist   5. Respiratory : Lungs are clear to auscultation bilaterally without wheezing, rhonchi, rales, no cyanosis, no increase in work of breathing or accessory muscle use   6. Cardiovascular : Heart rate normal, rhythm is regular, murmur present, rubs or gallops, significant peripheral edema, peripheral pulses palpated   7. Gastrointestinal:  Abdomen is soft, nondistended, nontender to palpation bowel sounds active, no masses or organomegaly palpated   8. Skin:  Erythema in the right leg compared to the left, blanchable, no definitive boundary to it, no weeping, no tenderness to palpation, continue to monitor for early cellulitis   9.Musculoskeletal:  No acute deformities or trauma, no asymmetry in tone, peripheral pulses palpated, no tenderness to palpation in the extremities  Data Reviewed: In the ED Temp 99.6, heart rate 67-74, respiratory rate 16, blood pressure 101/62-113/63, satting at 97% Leukocytosis 12.4, hemoglobin 8.3 Chemistry shows a BUN of 59 and a creatinine of 2.20 FOBT positive Blood cultures pending Chest x-ray shows cardiomegaly with small pleural effusion improved left pleural effusion and worsening vascular congestion UA is suspicious for UTI, urine culture pending Allergy to Rocephin so Cipro started at admission Admission requested for AKI, UTI Assessment and Plan: Pulmonary vascular congestion - Worsening vascular congestion - Cardiomegaly present - Pleural effusion present - Previous echo showed normal LV function - Peripheral edema present - Update echo  C. difficile colitis - Continue p.o. vancomycin PTA medication  Thyroid disease - Continue Synthroid  AKI (acute kidney injury) (HCC) - Likely due to dehydration/GI losses - Creatinine increased to 2.20 from 1.19 - Patient does have C. difficile and is  currently being treated for that - Hold nephrotoxic agents when possible - Patient was given 1 L bolus in the ED - Trend in the a.m. - Bicarb low at 19 - Continue sodium bicarb PTA medication  Decubitus ulcer - Wound VAC removed at SNF per report - Consult wound - Continue PTA medication Medihoney  Anemia - Hemoglobin drop from 10.3>> 8.3 in 6 weeks - FOBT positive - Consult GI - Anemia panel pending - Most likely sources decubitus ulcer - Continue to monitor  Anxiety - Continue Celexa and Atarax  Depression - Continue Celexa and trazodone      Advance Care Planning:   Code Status: Full Code  Consults: None at this time  Family Communication: No family at bedside  Severity of Illness: The appropriate patient status for this patient is INPATIENT. Inpatient status is judged to be reasonable and necessary in order to provide the required intensity of service to ensure the patient's safety. The patient's presenting symptoms, physical exam findings, and initial radiographic and laboratory data in the context of their chronic comorbidities is felt to place them at high risk for further clinical deterioration. Furthermore, it is not anticipated that the patient will be medically stable for discharge from the hospital within 2 midnights of admission.   * I certify that at the point of admission it is my clinical judgment that the patient will require inpatient hospital care spanning beyond 2 midnights from the point of admission due to high intensity of service, high risk for further deterioration and high frequency of surveillance required.*  Author: Lilyan Gilford, DO 10/20/2022 2:09 AM  For on call review [email or web address].

## 2022-10-20 NOTE — Assessment & Plan Note (Signed)
-   Continue as needed Atarax -Patient also chronically on Celexa. -Continue supportive care and constant reorientation.

## 2022-10-20 NOTE — Plan of Care (Signed)

## 2022-10-20 NOTE — Assessment & Plan Note (Signed)
Continue Synthroid °

## 2022-10-20 NOTE — Progress Notes (Signed)
Patient seen and examined; admitted after midnight secondary to fever and general malaise; patient is a long-term resident from skilled nursing facility, recently discharged after being admitted for C. difficile infection, dehydration and electrolyte disturbances approximately 2 weeks ago.  Now presenting with fever and workup suggestive for UTI.  Patient with advanced dementia at baseline and now contributing or evaporating March, her symptoms/complaints.  Please refer to H&P written by Dr. Carren Rang for further info/details on admission.  Plan: -Follow wound care recommendations for further management and care for decubitus pressure ulcer present at time of admission -Continue current antibiotics and follow culture result -Maintain adequate hydration and replete electrolytes as needed -Continue oral vancomycin tapering as previously prescribed for C. difficile infection. -Continue as needed antipyretics -Palliative care consultation for goals of care and advance care planning discussion requested.   Vassie Loll MD [phone]

## 2022-10-20 NOTE — Progress Notes (Signed)
Temp spike 102. Pt given PRN tylenol, covers removed and MD notified on MEWS change to yellow. Will continue to monitor. Only have 1 loose stool overnight.

## 2022-10-20 NOTE — Assessment & Plan Note (Signed)
-  Wound VAC removed at SNF per report - Appreciate assistance and recommendation by wound care service -Continue constant repositioning, wound care and preventive barriers.

## 2022-10-20 NOTE — Assessment & Plan Note (Signed)
-  Continue p.o. vancomycin; in the setting of new antibiotics requirement and is still ongoing diarrhea we will pursuit full dose of vancomycin therapy.  -Continue supportive care maintain adequate hydration.

## 2022-10-20 NOTE — Assessment & Plan Note (Signed)
-   Hemoglobin drop from 10.3>> 8.3 in 6 weeks - FOBT positive - Consult GI - Anemia panel pending - Most likely sources decubitus ulcer - Continue to monitor

## 2022-10-20 NOTE — Assessment & Plan Note (Signed)
-   Worsening vascular congestion - Cardiomegaly present - Pleural effusion present - Previous echo showed normal LV function - Peripheral edema present/chronic stasis dermatitis appreciated. - Follow echo results. -Follow daily weights/strict I's and O's -Patient reports no shortness of breath and there is no oxygen requirement at the moment.

## 2022-10-20 NOTE — Consult Note (Addendum)
Consulting  Provider: Dr. Marcello Fennel Primary Care Physician:  [name], FNP Primary Gastroenterologist:  .  Previously unassigned  Reason for Consultation: Anemia, C. difficile  HPI:  [name] is a 77 y.o. female with a past medical history of COPD, GERD, dyslipidemia, hypertension, decubitus ulcer, anxiety, depression, who presented to the [facility] ER yesterday evening with chief complaint of fever and weakness.  Patient is a poor historian.  Current resident at skilled nursing facility.  During her workup for fever, found to have hemoglobin of 8.3 with baseline around 10.  Heme positive stool.  Currently on vancomycin for diagnosis of C. difficile. Diagnosed 09/26/2022.  Has been having ongoing diarrhea.  Denies any frank melena or hematochezia.  Denies any abdominal pain for me.  Does note pain from a decubitus ulcer.  This is apparently being addressed by wound care with wound VAC placement which has since been removed.  States she has had 2 colonoscopies but cannot remember the last time this was done.  Believes it was done in [place].  I do not have access to any previous colonoscopy reports that I can find.  Denies any family history of colorectal malignancy.  Denies any chronic NSAID use.  Denies any history of H. pylori or peptic ulcer disease.  No dysphagia odynophagia.  Past Medical History:  Diagnosis Date   Allergy    Bradycardia    Bronchitis, chronic (HCC)    Chronic anxiety    Complication of anesthesia    hard to wake up   COPD (chronic obstructive pulmonary disease) (HCC)    Depression    Esophageal reflux    Headache(784.0)    Hyperlipidemia    Hypertension    Hypothyroidism    Obesity    Osteoporosis    Overactive bladder    PVC's (premature ventricular contractions)    Thyroid disease    Vitamin D deficiency disease     Past Surgical History:  Procedure Laterality Date   ABDOMINAL HYSTERECTOMY     CENTRAL LINE INSERTION Right  01/10/2022   Procedure: CENTRAL LINE INSERTION;  Surgeon: Lucretia Roers, MD;  Location: [facility];  Service: General;  Laterality: Right;   CHOLECYSTECTOMY N/A 11/04/2013   Procedure: LAPAROSCOPIC CHOLECYSTECTOMY WITH INTRAOPERATIVE CHOLANGIOGRAM;  Surgeon: Wilmon Arms. Corliss Skains, MD;  Location: [facility];  Service: General;  Laterality: N/A;   COLONOSCOPY     FRACTURE SURGERY     pins removed from Hip surgery   HIP FRACTURE SURGERY  1990    pins removed in 1991   IR FLUORO GUIDE CV LINE LEFT  01/31/2022   IR US GUIDE VASC ACCESS LEFT  01/31/2022   LAPAROTOMY N/A 01/10/2022   Procedure: EXPLORATORY LAPAROTOMY,;  Surgeon: Lucretia Roers, MD;  Location: [facility];  Service: General;  Laterality: N/A;   LYSIS OF ADHESION N/A 01/10/2022   Procedure: LYSIS OF ADHESION;  Surgeon: Lucretia Roers, MD;  Location: [facility];  Service: General;  Laterality: N/A;   PARTIAL HYSTERECTOMY  1987   SPINAL FUSION     THORACENTESIS N/A 01/18/2022   Procedure: THORACENTESIS;  Surgeon: Lorin Glass, MD;  Location: [facility] ENDOSCOPY;  Service: Pulmonary;  Laterality: N/A;   UPPER GASTROINTESTINAL ENDOSCOPY      Prior to Admission medications   Medication Sig Start Date End Date Taking? Authorizing Provider  acetaminophen (TYLENOL) 325 MG tablet Take 2 tablets (650 mg total) by mouth every 6 (six) hours as needed for mild pain (or Fever >/= 101). 10/01/22  Yes Emokpae, Courage, MD  Amino Acids-Protein Hydrolys (PRO-STAT) LIQD Take 30 mLs by mouth 2 (two) times daily.   Yes [provider]  ascorbic acid (VITAMIN C) 500 MG tablet Take 500 mg by mouth 2 (two) times daily.   Yes [provider]  Cholecalciferol (VITAMIN D3) 50 MCG (2000 UT) CAPS Take 2,000 Units by mouth every morning.   Yes [provider]  citalopram (CELEXA) 40 MG tablet Take 1 tablet (40 mg total) by mouth daily. 05/06/22  Yes Martin, Mary-Margaret, FNP  feeding supplement, ENSURE COMPLETE, (ENSURE COMPLETE) LIQD Take 237 mLs by mouth  2 (two) times daily between meals. 10/01/22  Yes Shon Hale, MD  ferrous sulfate 325 (65 FE) MG tablet Take 1 tablet (325 mg total) by mouth daily with breakfast. 10/01/22  Yes Emokpae, Courage, MD  glucose 4 GM chewable tablet Chew 1 tablet (4 g total) by mouth as needed for low blood sugar. 10/01/22  Yes Emokpae, Courage, MD  levothyroxine (SYNTHROID) 75 MCG tablet Take 1 tablet (75 mcg total) by mouth daily. 05/07/22  Yes Martin, Mary-Margaret, FNP  Magnesium Oxide 400 MG CAPS Take 1 capsule (400 mg total) by mouth daily. 09/04/22  Yes Burnadette Pop, MD  megestrol (MEGACE) 400 MG/10ML suspension Take 10 mLs (400 mg total) by mouth 2 (two) times daily. For appetite stimulation 10/01/22  Yes Emokpae, Courage, MD  Multiple Vitamin (MULTIVITAMIN) tablet Take 1 tablet by mouth every morning.   Yes [provider]  nutrition supplement, JUVEN, (JUVEN) PACK Take 1 packet by mouth 2 (two) times daily between meals.   Yes [provider]  nystatin (MYCOSTATIN/NYSTOP) powder Apply topically 3 (three) times daily. On groins 09/04/22  Yes Adhikari, Amrit, MD  OxyCODONE HCl, Abuse Deter, (OXAYDO) 5 MG TABA Take 5 mg by mouth daily as needed (severe pain). 10/01/22  Yes Emokpae, Courage, MD  rOPINIRole (REQUIP XL) 2 MG 24 hr tablet Take 2 mg by mouth at bedtime.   Yes [provider]  sodium bicarbonate 650 MG tablet Take 1 tablet (650 mg total) by mouth 3 (three) times daily. 10/01/22 10/01/23 Yes Emokpae, Courage, MD  traMADol (ULTRAM) 50 MG tablet Take 1 tablet (50 mg total) by mouth every 12 (twelve) hours as needed for moderate pain. 10/01/22 10/01/23 Yes Emokpae, Courage, MD  traZODone (DESYREL) 50 MG tablet Take 1 tablet (50 mg total) by mouth at bedtime. 10/01/22  Yes Emokpae, Courage, MD  vancomycin (VANCOCIN) 125 MG capsule Take 1 capsule (125 mg total) by mouth See admin instructions. Take Vancomycin  125 mg--1 capsule 4 times a day for 14 days, followed by 1 capsule twice daily for 7  days, then 1 capsule once daily for 7 days, then 1 capsule every other day for 7 days, then 1 capsule every 3 days for 7 days then Stop Patient taking differently: Take 125 mg by mouth in the morning and at bedtime. for 7 days then Stop for c-diff 10/01/22  Yes Emokpae, Courage, MD  Zinc 220 (50 Zn) MG CAPS Take 220 mg by mouth every morning. 10/01/22  Yes Shon Hale, MD    Current Facility-Administered Medications  Medication Dose Route Frequency Provider Last Rate Last Admin   acetaminophen (TYLENOL) tablet 650 mg  650 mg Oral Q6H PRN Zierle-Ghosh, Asia B, DO   650 mg at 10/20/22 2140   Or   acetaminophen (TYLENOL) suppository 650 mg  650 mg Rectal Q6H PRN Zierle-Ghosh, Asia B, DO       ciprofloxacin (  CIPRO) IVPB 400 mg  400 mg Intravenous Q24H Zierle-Ghosh, Asia B, DO 200 mL/hr at 10/20/22 2140 400 mg at 10/20/22 2140   citalopram (CELEXA) tablet 40 mg  40 mg Oral Daily Zierle-Ghosh, Asia B, DO   40 mg at 10/20/22 0933   heparin injection 5,000 Units  5,000 Units Subcutaneous Q8H Zierle-Ghosh, Asia B, DO   5,000 Units at 10/20/22 1503   hydrOXYzine (ATARAX) tablet 10 mg  10 mg Oral TID PRN Zierle-Ghosh, Asia B, DO       leptospermum manuka honey (MEDIHONEY) paste 1 Application  1 Application Topical Daily Zierle-Ghosh, Asia B, DO   1 Application at 10/20/22 0933   levothyroxine (SYNTHROID) tablet 75 mcg  75 mcg Oral Q0600 Zierle-Ghosh, Asia B, DO   75 mcg at 10/20/22 0535   liver oil-zinc oxide (DESITIN) 40 % ointment   Topical TID Vassie Loll, MD   Given at 10/20/22 1833   megestrol (MEGACE) 400 MG/10ML suspension 400 mg  400 mg Oral BID Zierle-Ghosh, Asia B, DO   400 mg at 10/20/22 2140   multivitamin with minerals tablet 1 tablet  1 tablet Oral q morning Zierle-Ghosh, Asia B, DO   1 tablet at 10/20/22 1610   nystatin (MYCOSTATIN/NYSTOP) topical powder   Topical TID Zierle-Ghosh, Asia B, DO   Given at 10/20/22 1503   ondansetron (ZOFRAN) tablet 4 mg  4 mg Oral Q6H PRN Zierle-Ghosh, Asia  B, DO       Or   ondansetron (ZOFRAN) injection 4 mg  4 mg Intravenous Q6H PRN Zierle-Ghosh, Asia B, DO       oxyCODONE (Oxy IR/ROXICODONE) immediate release tablet 5 mg  5 mg Oral Q4H PRN Zierle-Ghosh, Asia B, DO   5 mg at 10/20/22 0535   protein supplement (ENSURE MAX) liquid  237 mL Oral BID BM Zierle-Ghosh, Asia B, DO   237 mL at 10/20/22 1400   rOPINIRole (REQUIP) tablet 1 mg  1 mg Oral BID Vassie Loll, MD   1 mg at 10/20/22 2140   sodium bicarbonate tablet 650 mg  650 mg Oral TID Zierle-Ghosh, Asia B, DO   650 mg at 10/20/22 2140   traZODone (DESYREL) tablet 50 mg  50 mg Oral QHS Zierle-Ghosh, Asia B, DO   50 mg at 10/20/22 2140   vancomycin (VANCOCIN) capsule 125 mg  125 mg Oral BID Zierle-Ghosh, Asia B, DO   125 mg at 10/20/22 2140   zinc sulfate capsule 220 mg  220 mg Oral q morning Zierle-Ghosh, Asia B, DO   220 mg at 10/20/22 0933    Allergies as of 10/19/2022 - Review Complete 10/19/2022  Allergen Reaction Noted   Celebrex [celecoxib] Swelling 10/18/2011   Keflet [cephalexin] Swelling 10/18/2011   Penicillins Hives and Swelling 10/18/2011   Sulfa antibiotics Swelling 10/18/2011   Kenalog [triamcinolone acetonide]  10/18/2011   Latex Other (See Comments) 12/28/2018   Lisinopril Other (See Comments) 10/18/2011   Mobic [meloxicam]  03/31/2019   Penicillin g Other (See Comments) 05/01/2022    Family History  Problem Relation Age of Onset   Arthritis Other    Heart disease Other    Cancer Other    Diabetes Other    OCD Other     Social History   Socioeconomic History   Marital status: Single    Spouse name: Not on file   Number of children: 2   Years of education: Not on file   Highest education level: Not on file  Occupational History  Not on file  Tobacco Use   Smoking status: Never   Smokeless tobacco: Never  Vaping Use   Vaping Use: Never used  Substance and Sexual Activity   Alcohol use: No   Drug use: No   Sexual activity: Not on file  Other  Topics Concern   Not on file  Social History Narrative   Not on file   Social Determinants of Health   Financial Resource Strain: Low Risk  (11/28/2021)   Overall Financial Resource Strain (CARDIA)    Difficulty of Paying Living Expenses: Not hard at all  Food Insecurity: No Food Insecurity (10/19/2022)   Hunger Vital Sign    Worried About Running Out of Food in the Last Year: Never true    Ran Out of Food in the Last Year: Never true  Transportation Needs: No Transportation Needs (10/19/2022)   PRAPARE - Administrator, Civil Service (Medical): No    Lack of Transportation (Non-Medical): No  Physical Activity: Inactive (11/28/2021)   Exercise Vital Sign    Days of Exercise per Week: 0 days    Minutes of Exercise per Session: 0 min  Stress: Stress Concern Present (11/28/2021)   [employer] of Occupational Health - Occupational Stress Questionnaire    Feeling of Stress : To some extent  Social Connections: Socially Integrated (11/28/2021)   Social Connection and Isolation Panel [NHANES]    Frequency of Communication with Friends and Family: More than three times a week    Frequency of Social Gatherings with Friends and Family: More than three times a week    Attends Religious Services: More than 4 times per year    Active Member of [employer] or Organizations: Yes    Attends Engineer, structural: More than 4 times per year    Marital Status: Married  Catering manager Violence: Not At Risk (10/19/2022)   Humiliation, Afraid, Rape, and Kick questionnaire    Fear of Current or Ex-Partner: No    Emotionally Abused: No    Physically Abused: No    Sexually Abused: No    Review of Systems: General: Negative for anorexia, weight loss, fever, chills, fatigue, weakness. Eyes: Negative for vision changes.  ENT: Negative for hoarseness, difficulty swallowing , nasal congestion. CV: Negative for chest pain, angina, palpitations, dyspnea on exertion, peripheral edema.   Respiratory: Negative for dyspnea at rest, dyspnea on exertion, cough, sputum, wheezing.  GI: See history of present illness. GU:  Negative for dysuria, hematuria, urinary incontinence, urinary frequency, nocturnal urination.  MS: Negative for joint pain, low back pain.  Derm: Negative for rash or itching.  Neuro: Negative for weakness, abnormal sensation, seizure, frequent headaches, memory loss, confusion.  Psych: Negative for anxiety, depression Endo: Negative for unusual weight change.  Heme: Negative for bruising or bleeding. Allergy: Negative for rash or hives.  Physical Exam: Vital signs in last 24 hours: Temp:  [99.2 F (37.3 C)-102.6 F (39.2 C)] 99.2 F (37.3 C) (05/26 2133) Pulse Rate:  [77-92] 83 (05/26 2133) Resp:  [18-20] 20 (05/26 2133) BP: (93-120)/(59-70) 108/65 (05/26 2133) SpO2:  [95 %-99 %] 98 % (05/26 2133) Last BM Date : 10/20/22 General:   Alert,  Well-developed, well-nourished, pleasant and cooperative in NAD Head:  Normocephalic and atraumatic. Eyes:  Sclera clear, no icterus.   Conjunctiva pink. Ears:  Normal auditory acuity. Nose:  No deformity, discharge,  or lesions. Mouth:  No deformity or lesions, dentition normal. Neck:  Supple; no masses or thyromegaly. Lungs:  Clear throughout to auscultation.   No wheezes, crackles, or rhonchi. No acute distress. Heart:  Regular rate and rhythm; no murmurs, clicks, rubs,  or gallops. Abdomen:  Soft, nontender and nondistended. No masses, hepatosplenomegaly or hernias noted. Normal bowel sounds, without guarding, and without rebound.   Msk:  Symmetrical without gross deformities. Normal posture. Pulses:  Normal pulses noted. Extremities:  Without clubbing or edema. Neurologic:  Alert and  oriented x4;  grossly normal neurologically. Cervical Nodes:  No significant cervical adenopathy. Psych:  Alert and cooperative. Normal mood and affect.  Intake/Output from previous day: 05/25 0701 - 05/26 0700 In: 428.8  [P.O.:240; IV Piggyback:188.8] Out: 100 [Urine:100] Intake/Output this shift: No intake/output data recorded.  Lab Results: Recent Labs    10/19/22 1553 10/20/22 0417  WBC 12.4* 10.2  HGB 8.3* 9.0*  HCT 26.1* 28.7*  PLT 224 230   BMET Recent Labs    10/19/22 1553 10/20/22 0417  NA 132* 133*  K 4.2 4.3  CL 103 104  CO2 19* 17*  GLUCOSE 102* 73  BUN 59* 55*  CREATININE 2.20* 2.09*  CALCIUM 7.8* 7.7*   LFT Recent Labs    10/19/22 1553 10/20/22 0417  PROT 5.5* 5.5*  ALBUMIN 2.1* 2.0*  AST 39 37  ALT 38 37  ALKPHOS 180* 188*  BILITOT 0.9 0.9   PT/INR No results for input(s): "LABPROT", "INR" in the last 72 hours. Hepatitis Panel No results for input(s): "HEPBSAG", "HCVAB", "HEPAIGM", "HEPBIGM" in the last 72 hours. C-Diff No results for input(s): "CDIFFTOX" in the last 72 hours.  Studies/Results: CT ABDOMEN PELVIS WO CONTRAST  Result Date: 10/19/2022 CLINICAL DATA:  Fever. Weakness for couple of days. Recently diagnosis C diff and diarrhea. Sacral wound. EXAM: CT ABDOMEN AND PELVIS WITHOUT CONTRAST TECHNIQUE: Multidetector CT imaging of the abdomen and pelvis was performed following the standard protocol without IV contrast. RADIATION DOSE REDUCTION: This exam was performed according to the departmental dose-optimization program which includes automated exposure control, adjustment of the mA and/or kV according to patient size and/or use of iterative reconstruction technique. COMPARISON:  CT examination dated January 17, 2022 FINDINGS: Lower chest: Small bilateral pleural effusions left greater than the right with left basilar atelectasis. Moderate-sized hiatal hernia. Hepatobiliary: No focal liver abnormality is seen. Status post cholecystectomy. No biliary dilatation. Pancreas: Unremarkable. No pancreatic ductal dilatation or surrounding inflammatory changes. Spleen: Normal in size without focal abnormality. Adrenals/Urinary Tract: Adrenal glands are unremarkable.  Kidneys are normal, without renal calculi, focal lesion, or hydronephrosis. Bladder is unremarkable. Stomach/Bowel: Stomach is within normal limits. Appendix appears normal. No evidence of bowel wall thickening, distention, or inflammatory changes. Moderate amount of stool throughout the colon. Vascular/Lymphatic: Aortic atherosclerosis. No enlarged abdominal or pelvic lymph nodes. Reproductive: Status post hysterectomy. No adnexal masses. Other: Marked generalized anasarca. No abdominopelvic ascites. Skin thickening and decubitus sacral ulcer. Musculoskeletal: Posterior spinal and interbody no acute osseous abnormality. Fusion at L4-S1 IMPRESSION: 1. Marked generalized anasarca. 2. Small bilateral pleural effusions left greater than the right with left basilar atelectasis. 3. Moderate-sized hiatal hernia. 4. No CT evidence of acute abdominal/pelvic process. 5. Skin thickening and decubitus sacral ulcer. 6. Aortic atherosclerosis. Aortic Atherosclerosis (ICD10-I70.0). Electronically Signed   By: Larose Hires D.O.   On: 10/19/2022 17:44   DG Chest Portable 1 View  Result Date: 10/19/2022 CLINICAL DATA:  Fever. Weakness. EXAM: PORTABLE CHEST 1 VIEW COMPARISON:  Radiograph 09/30/2022 FINDINGS: Stable cardiomegaly. Again seen aortic atherosclerosis and tortuosity. Retrocardiac hiatal hernia. Similar right pleural effusion.  Improved left pleural effusion. Vascular congestion again seen, slight worsening. No confluent consolidation. The bones are diffusely under mineralized. IMPRESSION: 1. No acute airspace disease to account for fever. 2. Stable cardiomegaly. Similar right pleural effusion. Improved left pleural effusion. 3. Worsening vascular congestion. Electronically Signed   By: Narda Rutherford M.D.   On: 10/19/2022 16:26    Impression: *Acute on chronic anemia *C. difficile infection *Heme positive stool  Plan: Agree with full course of vancomycin for recent C. difficile.  In regards to her acute on  chronic anemia, patient denies any frank melena or hematochezia.  Would continue to monitor for now.  Consider updating EGD/colonoscopy in the outpatient setting.  If she has evidence of bleeding during her hospital stay, will consider inpatient endoscopic evaluation.  Otherwise supportive care.  Agree with palliative consultation for goals of care discussions.  Hennie Duos. Marletta Lor, D.O. Gastroenterology and Hepatology [facility]    LOS: 1 day     10/20/2022, 9:46 PM

## 2022-10-20 NOTE — Assessment & Plan Note (Signed)
-   Stable mood; no active hallucination -Continue the use of Celexa and trazodone.

## 2022-10-20 NOTE — Assessment & Plan Note (Signed)
-   Most likely due to dehydration/GI losses - After fluid resuscitation and supportive care creatinine essentially back to baseline -Continue to minimize nephrotoxic agents as much as possible, avoid the use of contrast and prevent hypotension. -Continue to follow renal function trend.   -Continue the use of sodium bicarbonate (patient with mild metabolic acidosis component).

## 2022-10-20 NOTE — Consult Note (Signed)
WOC Nurse Consult Note: Reason for Consult:stage 3 pressure injury to left sacral area. Known to our department from previous admissions Wound type:pressure Pressure Injury POA: Yes Measurement: 5.5cm x 5cm x 1cm with 3cm undermining from 3 -7 o'clock Wound bed: red, nongranulating, with 10% white clough in wound bed Drainage (amount, consistency, odor) moderate light yellow Periwound: intact Dressing procedure/placement/frequency:Patient is incontinent of liquid stool at the time of my assessment, requiring complete bed and gown change. She reports being unable to hold back the urge and call for assistance. Bilateral heels are intact, previous partial thickness pressure injury of the left heel has healed. Patient's left thigh and RLE are edematous and erythematous.  Topical care to pressure injury will be with daily cleansing and filling of defect with calcium alginate dressing. This is to be covered with dry gauze and secured with silicone foam.  Turning and repositioning is in place, Prevalon boots are provided.  WOC nursing team will not follow, but will remain available to this patient, the nursing and medical teams.  Please re-consult if needed.  Thank you for inviting [place] to participate in this patient's Plan of Care.  Ladona Mow, MSN, RN, CNS, GNP, Leda Min, [employer], [employer] phone:  [phone]

## 2022-10-21 ENCOUNTER — Encounter (HOSPITAL_COMMUNITY): Payer: Self-pay | Admitting: Family Medicine

## 2022-10-21 ENCOUNTER — Inpatient Hospital Stay (HOSPITAL_COMMUNITY): Payer: Medicare Other

## 2022-10-21 DIAGNOSIS — Z7189 Other specified counseling: Secondary | ICD-10-CM | POA: Diagnosis not present

## 2022-10-21 DIAGNOSIS — R0989 Other specified symptoms and signs involving the circulatory and respiratory systems: Secondary | ICD-10-CM

## 2022-10-21 DIAGNOSIS — F03918 Unspecified dementia, unspecified severity, with other behavioral disturbance: Secondary | ICD-10-CM

## 2022-10-21 DIAGNOSIS — E079 Disorder of thyroid, unspecified: Secondary | ICD-10-CM

## 2022-10-21 DIAGNOSIS — F419 Anxiety disorder, unspecified: Secondary | ICD-10-CM | POA: Diagnosis not present

## 2022-10-21 DIAGNOSIS — N3000 Acute cystitis without hematuria: Secondary | ICD-10-CM | POA: Diagnosis not present

## 2022-10-21 DIAGNOSIS — F332 Major depressive disorder, recurrent severe without psychotic features: Secondary | ICD-10-CM

## 2022-10-21 DIAGNOSIS — L89154 Pressure ulcer of sacral region, stage 4: Secondary | ICD-10-CM

## 2022-10-21 DIAGNOSIS — I509 Heart failure, unspecified: Secondary | ICD-10-CM

## 2022-10-21 DIAGNOSIS — D649 Anemia, unspecified: Secondary | ICD-10-CM | POA: Diagnosis not present

## 2022-10-21 DIAGNOSIS — N390 Urinary tract infection, site not specified: Secondary | ICD-10-CM | POA: Diagnosis present

## 2022-10-21 DIAGNOSIS — Z515 Encounter for palliative care: Secondary | ICD-10-CM | POA: Diagnosis not present

## 2022-10-21 DIAGNOSIS — N179 Acute kidney failure, unspecified: Secondary | ICD-10-CM

## 2022-10-21 DIAGNOSIS — A0472 Enterocolitis due to Clostridium difficile, not specified as recurrent: Secondary | ICD-10-CM

## 2022-10-21 LAB — ECHOCARDIOGRAM COMPLETE
AR max vel: 1.83 cm2
AV Area VTI: 1.96 cm2
AV Area mean vel: 1.93 cm2
AV Mean grad: 7 mmHg
AV Peak grad: 14.3 mmHg
Ao pk vel: 1.89 m/s
Area-P 1/2: 2.88 cm2
Height: 62 in
S' Lateral: 2.6 cm
Weight: 2486.79 oz

## 2022-10-21 LAB — URINE CULTURE: Culture: >=100000 — AB

## 2022-10-21 MED ORDER — CYANOCOBALAMIN 1000 MCG/ML IJ SOLN
1000.0000 ug | Freq: Every day | INTRAMUSCULAR | Status: AC
  Administered 2022-10-21 – 2022-10-27 (×7): 1000 ug via INTRAMUSCULAR
  Filled 2022-10-21 (×7): qty 1

## 2022-10-21 MED ORDER — PROSOURCE PLUS PO LIQD
30.0000 mL | Freq: Three times a day (TID) | ORAL | Status: DC
  Administered 2022-10-21 – 2022-10-28 (×19): 30 mL via ORAL
  Filled 2022-10-21 (×20): qty 30

## 2022-10-21 MED ORDER — VANCOMYCIN HCL 125 MG PO CAPS
125.0000 mg | ORAL_CAPSULE | Freq: Four times a day (QID) | ORAL | Status: DC
  Administered 2022-10-21 – 2022-10-24 (×16): 125 mg via ORAL
  Filled 2022-10-21 (×30): qty 1

## 2022-10-21 NOTE — Assessment & Plan Note (Signed)
-  Slightly associated with ongoing diarrhea from C. difficile infection -Following patient's allergy history continue treatment with ciprofloxacin. -Maintain adequate hydration -Continue supportive care -Follow urine culture results. -Still demonstrating low-grade temperature; continue as needed antipyretics.

## 2022-10-21 NOTE — Progress Notes (Signed)
Echocardiogram 2D Echocardiogram has been performed.  Warren Lacy Chanta Bauers RDCS 10/21/2022, 2:59 PM

## 2022-10-21 NOTE — TOC Initial Note (Signed)
Transition of Care [facility]) - Initial/Assessment Note    Patient Details  Name: [name] MRN: [id number] Date of Birth: 1945-07-14  Transition of Care [facility]) CM/SW Contact:    Elliot Gault, LCSW Phone Number: 10/21/2022, 10:14 AM  Clinical Narrative:                  Pt admitted from [facility]. Pt known to TOC from several previous admissions. Pt has been in rehab at the facility. Plan is for return to the SNF at dc.  Palliative consulted.  TOC will follow and continue to assess and assist.  Expected Discharge Plan: Skilled Nursing Facility Barriers to Discharge: Continued Medical Work up   Patient Goals and CMS Choice Patient states their goals for this hospitalization and ongoing recovery are:: return to private room at [facility]   Choice offered to / list presented to : Patient      Expected Discharge Plan and Services In-house Referral: Clinical Social Work   Post Acute Care Choice: Resumption of Svcs/PTA Provider Living arrangements for the past 2 months: Skilled Nursing Facility                                      Prior Living Arrangements/Services Living arrangements for the past 2 months: Skilled Nursing Facility Lives with:: Facility Resident   Do you feel safe going back to the place where you live?: Yes      Need for Family Participation in Patient Care: No (Comment) Care giver support system in place?: Yes (comment)   Criminal Activity/Legal Involvement Pertinent to Current Situation/Hospitalization: No - Comment as needed  Activities of Daily Living Home Assistive Devices/Equipment: None ADL Screening (condition at time of admission) Patient's cognitive ability adequate to safely complete daily activities?: No Is the patient deaf or have difficulty hearing?: No Does the patient have difficulty seeing, even when wearing glasses/contacts?: No Does the patient have difficulty concentrating, remembering, or making decisions?: Yes (h/o  dementia) Patient able to express need for assistance with ADLs?: No Does the patient have difficulty dressing or bathing?: No Independently performs ADLs?: No Communication: Independent Dressing (OT): Independent Is this a change from baseline?: Pre-admission baseline Is this a change from baseline?: Pre-admission baseline Is this a change from baseline?: Pre-admission baseline Bathing: Independent with device (comment) Is this a change from baseline?: Pre-admission baseline Toileting: Independent with device (comment) Is this a change from baseline?: Pre-admission baseline Is this a change from baseline?: Pre-admission baseline Is this a change from baseline?: Pre-admission baseline Does the patient have difficulty walking or climbing stairs?: No Weakness of Legs: Both Weakness of Arms/Hands: None  Permission Sought/Granted                  Emotional Assessment Appearance:: Appears stated age Attitude/Demeanor/Rapport: Engaged Affect (typically observed): Pleasant Orientation: : Oriented to Self, Oriented to Place, Oriented to  Time, Oriented to Situation Alcohol / Substance Use: Not Applicable Psych Involvement: No (comment)  Admission diagnosis:  AKI (acute kidney injury) (HCC) [N17.9] Urinary tract infection without hematuria, site unspecified [N39.0] Gastrointestinal hemorrhage, unspecified gastrointestinal hemorrhage type [K92.2] Anemia, unspecified type [D64.9] Patient Active Problem List   Diagnosis Date Noted   UTI (urinary tract infection) 10/21/2022   Thyroid disease 10/20/2022   C. difficile colitis 10/20/2022   Pulmonary vascular congestion 10/20/2022   AKI (acute kidney injury) (HCC) 10/19/2022   Dysphagia 10/07/2022   Obesity 10/07/2022  Dementia with behavioral disturbance (HCC) 09/30/2022   Hypoglycemia 09/26/2022   Frequent falls 08/30/2022   Fall at home, initial encounter 08/29/2022   Low back pain 08/29/2022   Failure to thrive in adult  08/29/2022   Physical deconditioning 08/29/2022   Elevated CK 08/29/2022   Decubitus ulcer 08/29/2022   GERD (gastroesophageal reflux disease) 08/29/2022   Restless leg syndrome 08/29/2022   Rhabdomyolysis 07/07/2022   Dehydration 07/07/2022   Transaminitis 07/07/2022   Hypotension 07/07/2022   SIRS (systemic inflammatory response syndrome) (HCC) 07/07/2022   Hypokalemia 07/07/2022   Stage 3b chronic kidney disease (CKD) (HCC) 07/07/2022   Hypothermia 07/07/2022   Acute metabolic encephalopathy 07/06/2022   BMI 26.0-26.9,adult 05/06/2022   Atrial fibrillation (HCC) 04/12/2022   Congenital heart failure (HCC) 04/12/2022   Malnutrition of moderate degree 01/14/2022   Respiratory failure requiring intubation (HCC) 01/11/2022    Class: Acute   Septic shock (HCC) 01/10/2022   Pressure injury of skin 01/10/2022   Small bowel obstruction (HCC) 01/07/2022   Abnormal weight loss 11/28/2021   Family history of malignant neoplasm of digestive organs 11/28/2021   Intestinal malabsorption 11/28/2021   Renal insufficiency 12/08/2020   Degeneration of lumbar intervertebral disc 01/07/2020   Venous (peripheral) insufficiency 09/30/2019   Anemia 08/12/2017   Vitamin D deficiency 08/12/2017   Lumbar spondylosis with myelopathy 06/03/2017   Primary osteoarthritis of both knees 03/19/2017   Anxiety 02/17/2017   History of pulmonary embolus (PE) 07/09/2016   COPD (chronic obstructive pulmonary disease) (HCC) 01/18/2016   Mixed hyperlipidemia 01/18/2016   Depression 01/18/2016   Essential hypertension 01/18/2016   H/O spinal fusion 01/18/2016   Acquired hypothyroidism 01/18/2016   Thoracic aorta atherosclerosis (HCC) 01/18/2016   Chronic cholecystitis with calculus 10/04/2013   PCP:  [name], FNP Pharmacy:   [facility] - [place] - [address] [place] [address] Phone: [phone] Fax: [phone]  [facility] - [place], [place] -  [address] [place] [address] Phone: [phone] Fax: [phone]     Social Determinants of Health (SDOH) Social History: SDOH Screenings   Food Insecurity: No Food Insecurity (10/19/2022)  Housing: Low Risk  (10/19/2022)  Transportation Needs: No Transportation Needs (10/19/2022)  Utilities: Not At Risk (10/19/2022)  Alcohol Screen: Low Risk  (11/28/2021)  Depression (PHQ2-9): Low Risk  (05/06/2022)  Financial Resource Strain: Low Risk  (11/28/2021)  Physical Activity: Inactive (11/28/2021)  Social Connections: Socially Integrated (11/28/2021)  Stress: Stress Concern Present (11/28/2021)  Tobacco Use: Low Risk  (10/19/2022)   SDOH Interventions:     Readmission Risk Interventions    10/21/2022   10:12 AM 08/30/2022    3:36 PM  Readmission Risk Prevention Plan  Transportation Screening Complete Complete  PCP or Specialist Appt within 3-5 Days  Complete  Social Work Consult for Recovery Care Planning/Counseling  Complete  Palliative Care Screening  Not Applicable  Medication Review Oceanographer) Complete Complete  HRI or Home Care Consult Complete   SW Recovery Care/Counseling Consult Complete   Palliative Care Screening Not Applicable   Skilled Nursing Facility Complete

## 2022-10-21 NOTE — [place] FL2 (Signed)
[place] MEDICAID FL2 LEVEL OF CARE FORM     IDENTIFICATION  Patient Name: [name] Birthdate: [birth year] Sex: female Admission Date (Current Location): 10/19/2022  [facility] and [place] Number:  [employer] and Address:  [facility],  [address] [place] [address]      Provider Number: [id number]  Attending Physician Name and Address:  Vassie Loll, MD  Relative Name and Phone Number:       Current Level of Care: Hospital Recommended Level of Care: Skilled Nursing Facility Prior Approval Number:    Date Approved/Denied:   PASRR Number:    Discharge Plan: SNF    Current Diagnoses: Patient Active Problem List   Diagnosis Date Noted   UTI (urinary tract infection) 10/21/2022   Thyroid disease 10/20/2022   C. difficile colitis 10/20/2022   Pulmonary vascular congestion 10/20/2022   AKI (acute kidney injury) (HCC) 10/19/2022   Dysphagia 10/07/2022   Obesity 10/07/2022   Dementia with behavioral disturbance (HCC) 09/30/2022   Hypoglycemia 09/26/2022   Frequent falls 08/30/2022   Fall at home, initial encounter 08/29/2022   Low back pain 08/29/2022   Failure to thrive in adult 08/29/2022   Physical deconditioning 08/29/2022   Elevated CK 08/29/2022   Decubitus ulcer 08/29/2022   GERD (gastroesophageal reflux disease) 08/29/2022   Restless leg syndrome 08/29/2022   Rhabdomyolysis 07/07/2022   Dehydration 07/07/2022   Transaminitis 07/07/2022   Hypotension 07/07/2022   SIRS (systemic inflammatory response syndrome) (HCC) 07/07/2022   Hypokalemia 07/07/2022   Stage 3b chronic kidney disease (CKD) (HCC) 07/07/2022   Hypothermia 07/07/2022   Acute metabolic encephalopathy 07/06/2022   BMI 26.0-26.9,adult 05/06/2022   Atrial fibrillation (HCC) 04/12/2022   Congenital heart failure (HCC) 04/12/2022   Malnutrition of moderate degree 01/14/2022   Respiratory failure requiring intubation (HCC) 01/11/2022   Septic shock (HCC) 01/10/2022    Pressure injury of skin 01/10/2022   Small bowel obstruction (HCC) 01/07/2022   Abnormal weight loss 11/28/2021   Family history of malignant neoplasm of digestive organs 11/28/2021   Intestinal malabsorption 11/28/2021   Renal insufficiency 12/08/2020   Degeneration of lumbar intervertebral disc 01/07/2020   Venous (peripheral) insufficiency 09/30/2019   Anemia 08/12/2017   Vitamin D deficiency 08/12/2017   Lumbar spondylosis with myelopathy 06/03/2017   Primary osteoarthritis of both knees 03/19/2017   Anxiety 02/17/2017   History of pulmonary embolus (PE) 07/09/2016   COPD (chronic obstructive pulmonary disease) (HCC) 01/18/2016   Mixed hyperlipidemia 01/18/2016   Depression 01/18/2016   Essential hypertension 01/18/2016   H/O spinal fusion 01/18/2016   Acquired hypothyroidism 01/18/2016   Thoracic aorta atherosclerosis (HCC) 01/18/2016   Chronic cholecystitis with calculus 10/04/2013    Orientation RESPIRATION BLADDER Height & Weight     Self, Time, Situation, Place  Normal Incontinent Weight: 155 lb 6.8 oz (70.5 kg) Height:  5\' 2"  (157.5 cm)  BEHAVIORAL SYMPTOMS/MOOD NEUROLOGICAL BOWEL NUTRITION STATUS      Incontinent Diet (see dc summary)  AMBULATORY STATUS COMMUNICATION OF NEEDS Skin   Extensive Assist Verbally PU Stage and Appropriate Care                       Personal Care Assistance Level of Assistance    Bathing Assistance: Maximum assistance Feeding assistance: Limited assistance Dressing Assistance: Maximum assistance     Functional Limitations Info    Sight Info: Impaired Hearing Info: Impaired Speech Info: Adequate    SPECIAL CARE FACTORS FREQUENCY  PT (By licensed  PT), OT (By licensed OT)     PT Frequency: 5x week OT Frequency: 3x week            Contractures Contractures Info: Not present    Additional Factors Info    Code Status Info: DNR Allergies Info: Celebrex (Celecoxib), Keflet (Cephalexin), Penicillins, Sulfa  Antibiotics, Kenalog (Triamcinolone Acetonide), Latex, Lisinopril, Mobic (Meloxicam), Penicillin G           Current Medications (10/21/2022):  This is the current hospital active medication list Current Facility-Administered Medications  Medication Dose Route Frequency Provider Last Rate Last Admin   acetaminophen (TYLENOL) tablet 650 mg  650 mg Oral Q6H PRN Zierle-Ghosh, Asia B, DO   650 mg at 10/21/22 0548   Or   acetaminophen (TYLENOL) suppository 650 mg  650 mg Rectal Q6H PRN Zierle-Ghosh, Asia B, DO       ciprofloxacin (CIPRO) IVPB 400 mg  400 mg Intravenous Q24H Zierle-Ghosh, Asia B, DO 200 mL/hr at 10/20/22 2140 400 mg at 10/20/22 2140   citalopram (CELEXA) tablet 40 mg  40 mg Oral Daily Zierle-Ghosh, Asia B, DO   40 mg at 10/21/22 0848   cyanocobalamin (VITAMIN B12) injection 1,000 mcg  1,000 mcg Intramuscular Daily Vassie Loll, MD   1,000 mcg at 10/21/22 0847   heparin injection 5,000 Units  5,000 Units Subcutaneous Q8H Zierle-Ghosh, Asia B, DO   5,000 Units at 10/21/22 0548   hydrOXYzine (ATARAX) tablet 10 mg  10 mg Oral TID PRN Zierle-Ghosh, Asia B, DO       levothyroxine (SYNTHROID) tablet 75 mcg  75 mcg Oral Q0600 Zierle-Ghosh, Asia B, DO   75 mcg at 10/21/22 0548   liver oil-zinc oxide (DESITIN) 40 % ointment   Topical TID Vassie Loll, MD   Given at 10/21/22 0849   megestrol (MEGACE) 400 MG/10ML suspension 400 mg  400 mg Oral BID Zierle-Ghosh, Asia B, DO   400 mg at 10/21/22 0848   multivitamin with minerals tablet 1 tablet  1 tablet Oral q morning Zierle-Ghosh, Asia B, DO   1 tablet at 10/21/22 0848   nystatin (MYCOSTATIN/NYSTOP) topical powder   Topical TID Zierle-Ghosh, Asia B, DO   Given at 10/21/22 0851   ondansetron (ZOFRAN) tablet 4 mg  4 mg Oral Q6H PRN Zierle-Ghosh, Asia B, DO       Or   ondansetron (ZOFRAN) injection 4 mg  4 mg Intravenous Q6H PRN Zierle-Ghosh, Asia B, DO       oxyCODONE (Oxy IR/ROXICODONE) immediate release tablet 5 mg  5 mg Oral Q4H PRN  Zierle-Ghosh, Asia B, DO   5 mg at 10/20/22 0535   protein supplement (ENSURE MAX) liquid  237 mL Oral BID BM Zierle-Ghosh, Asia B, DO   237 mL at 10/21/22 0959   rOPINIRole (REQUIP) tablet 1 mg  1 mg Oral BID Vassie Loll, MD   1 mg at 10/21/22 0848   sodium bicarbonate tablet 650 mg  650 mg Oral TID Zierle-Ghosh, Asia B, DO   650 mg at 10/21/22 0847   traZODone (DESYREL) tablet 50 mg  50 mg Oral QHS Zierle-Ghosh, Asia B, DO   50 mg at 10/20/22 2140   vancomycin (VANCOCIN) capsule 125 mg  125 mg Oral QID Vassie Loll, MD       zinc sulfate capsule 220 mg  220 mg Oral q morning Zierle-Ghosh, Asia B, DO   220 mg at 10/21/22 0848     Discharge Medications: Please see discharge summary for a list of  discharge medications.  Relevant Imaging Results:  Relevant Lab Results:   Additional Information Please have staff feed patient  Elliot Gault, LCSW

## 2022-10-21 NOTE — Assessment & Plan Note (Signed)
-  Continue supportive care and considering tension -Mood currently stable -Continue the use of Celexa -Patient demonstrating poor insight of her condition. -Follow clinical response. -Palliative care consultation requested.

## 2022-10-21 NOTE — Progress Notes (Signed)
Patient slept through the night no complaint of pain. Patient temp 99.2 at beginning of shift and 100.5 at end of shift, Tylenol given both times. Wound care done. Patient continues to have loose stools and redness to leg. Continued to monitor patient.

## 2022-10-21 NOTE — Progress Notes (Signed)
Initial Nutrition Assessment  DOCUMENTATION CODES:      INTERVENTION:  Ensure MAX BID (150 kcal, 30 gr protein) each bottle.   Please set up tray every meal  Vitamin C 500 mg daily  Zinc 220 mg x 14 days  Prosource Plus 30 ml TID with meals  NUTRITION DIAGNOSIS:   Increased nutrient needs related to wound healing as evidenced by estimated needs. -ongoing  GOAL:  Patient will meet greater than or equal to 90% of their needs -not met  MONITOR:  PO intake, Supplement acceptance, Labs, Skin, Weight trends  REASON FOR ASSESSMENT:   Low Braden Wound healing  ASSESSMENT: Patient is a 77 yo from SNF. Patient has a left sacral area stage 3 and left heel PI resolved per WOC. C.diff positive earlier this month.Presents with UTI, AKI, ongoing C diff colitis, B-12 deficiency.  PMH: CKD-3b, dementia, depression, chronic anxiety.  Patient ate 1-50% of four meals. She doesn't like eggs and bacon exacerbates diarrhea for her. She likes oatmeal, cheerios or greek yogurt  for breakfast and will also eat cottage cheese and fruit. Prefers soft foods, likes tomato sandwiches. Assisted patient with adding condiments to her hamburger and set up for her watermelon. Cut sandwiches in half to facilitate ease of managing.  Encouraged protein with each meal.   Medications reviewed and include: B-12, Megace, MVI.   Weight history 67.8 kg in December 2023. 09/27/22- 67.6 kg. Fluctuations noted. Current wt 70.5 kg.   CBG (last 3)  No results for input(s): "GLUCAP" in the last 72 hours.      Latest Ref Rng & Units 10/20/2022    4:17 AM 10/19/2022    3:53 PM 10/01/2022    4:47 AM  BMP  Glucose 70 - 99 mg/dL 73  161  81   BUN 8 - 23 mg/dL 55  59  21   Creatinine 0.44 - 1.00 mg/dL 0.96  0.45  4.09   Sodium 135 - 145 mmol/L 133  132  137   Potassium 3.5 - 5.1 mmol/L 4.3  4.2  3.6   Chloride 98 - 111 mmol/L 104  103  106   CO2 22 - 32 mmol/L 17  19  25    Calcium 8.9 - 10.3 mg/dL 7.7  7.8  8.1        NUTRITION - FOCUSED PHYSICAL EXAM:  Flowsheet Row Most Recent Value  Orbital Region Mild depletion  Upper Arm Region Mild depletion  Thoracic and Lumbar Region No depletion  Buccal Region Mild depletion  Temple Region Mild depletion  Clavicle Bone Region Moderate depletion  Clavicle and Acromion Bone Region Severe depletion  Dorsal Hand Mild depletion  Patellar Region Moderate depletion  Anterior Thigh Region Severe depletion  Posterior Calf Region Moderate depletion  Edema (RD Assessment) None  Hair Reviewed  Eyes Reviewed  Mouth Reviewed  Skin Reviewed  [dry- forearms]  Nails Reviewed       Diet Order:   Diet Order             Diet Heart Room service appropriate? Yes; Fluid consistency: Thin  Diet effective now                   EDUCATION NEEDS:  Education needs have been addressed  Skin:  Skin Assessment: Skin Integrity Issues: Skin Integrity Issues:: Stage III Stage III: left sacral area ( see WOC note)  Last BM:  5/26 type 6  Height:   Ht Readings from Last 1 Encounters:  10/19/22  5\' 2"  (1.575 m)    Weight:   Wt Readings from Last 1 Encounters:  10/19/22 70.5 kg    Ideal Body Weight:   50 kg  BMI:  Body mass index is 28.43 kg/m.  Estimated Nutritional Needs:   Kcal:  1700-1870  Protein:  90-99 gr  Fluid:  1700 ml daily   Royann Shivers MS,RD,CSG,LDN Contact: Loretha Stapler

## 2022-10-21 NOTE — Consult Note (Signed)
Consultation Note Date: 10/21/2022   Patient Name: [name]  DOB: November 25, 1945  MRN: [id number]  Age / Sex: 77 y.o., female  PCP: [name], FNP Referring Physician: Vassie Loll, MD  Reason for Consultation: Establishing goals of care  HPI/Patient Profile: 77 y.o. female  with past medical history of anxiety/depression, COPD, GERD, HTN/HLD, hypothyroid, decubitus ulcer, resident of [facility] under long-term care, osteoporosis, hard of hearing admitted on 10/19/2022 with UTI slightly associated with ongoing diarrhea from C. difficile, dementia.   Clinical Assessment and Goals of Care: I have reviewed medical records including EPIC notes, labs and imaging, received report from RN, assessed the patient.  [name] is lying quietly in bed.  She appears acutely/chronically ill and quite frail.  She is resting comfortably, she acknowledges my entrance, making and somewhat keeping eye contact.  She is alert and oriented x 3, able to make her needs known.  There is no family at bedside at this time.   We meet at the bedside to discuss diagnosis prognosis, GOC, EOL wishes, disposition and options.  I introduced Palliative Medicine as specialized medical care for people living with serious illness. It focuses on providing relief from the symptoms and stress of a serious illness. The goal is to improve quality of life for both the patient and the family.  We focused on their current illness.  We talk about her acute health conditions including, but not limited to, UTI.  We talk about her loose stool and the treatment plan.  We talk about returning to [facility].  I ask why she was first admitted to [facility] and she tells me that she fell and hurt her hip and has has issues since that time.  The natural disease trajectory and expectations at EOL were discussed.  Advanced directives, concepts specific to  code status, artifical feeding and hydration, and rehospitalization were considered and discussed.  We talk about the concept of "treat the treatable, but allowing natural passing".  [name] tells me that she has not considered these choices.   Discussed the importance of continued conversation with family and the medical providers regarding overall plan of care and treatment options, ensuring decisions are within the context of the patient's values and GOCs.  Questions and concerns were addressed.  The patient was encouraged to call with questions or concerns.  PMT will continue to support holistically.  Conference with attending, bedside nursing staff, transition of care team related to patient condition, needs, goals of care, disposition.    HCPOA HCPOA -[name] is named her son, [name] as her healthcare surrogate.  HCPOA paperwork noted in ACP tab of epic chart.    SUMMARY OF RECOMMENDATIONS   At this point continue full scope/full code. Return to long-term care at [facility]. Would benefit from [facility] inpatient palliative care services.    Code Status/Advance Care Planning: Full code  Symptom Management:  Per hospitalist, no additional needs at this time.  Palliative Prophylaxis:  Frequent Pain Assessment, Palliative Wound Care,  and Turn Reposition  Additional Recommendations (Limitations, Scope, Preferences): Full Scope Treatment  Psycho-social/Spiritual:  Desire for further Chaplaincy support:no Additional Recommendations: Caregiving  Support/Resources  Prognosis:  Unable to determine, guarded based on 4 hospital admits in the last 6 months, decreasing functional status, long-term care resident.  Discharge Planning: Skilled Nursing Facility for rehab with Palliative care service follow-up      Primary Diagnoses: Present on Admission:  AKI (acute kidney injury) (HCC)  Anemia  Anxiety  Decubitus ulcer  Depression  Dementia with behavioral  disturbance (HCC)  UTI (urinary tract infection)   I have reviewed the medical record, interviewed the patient and family, and examined the patient. The following aspects are pertinent.  Past Medical History:  Diagnosis Date   Allergy    Bradycardia    Bronchitis, chronic (HCC)    Chronic anxiety    Complication of anesthesia    hard to wake up   COPD (chronic obstructive pulmonary disease) (HCC)    Depression    Esophageal reflux    Headache(784.0)    Hyperlipidemia    Hypertension    Hypothyroidism    Obesity    Osteoporosis    Overactive bladder    PVC's (premature ventricular contractions)    Thyroid disease    Vitamin D deficiency disease    Social History   Socioeconomic History   Marital status: Single    Spouse name: Not on file   Number of children: 2   Years of education: Not on file   Highest education level: Not on file  Occupational History   Not on file  Tobacco Use   Smoking status: Never   Smokeless tobacco: Never  Vaping Use   Vaping Use: Never used  Substance and Sexual Activity   Alcohol use: No   Drug use: No   Sexual activity: Not on file  Other Topics Concern   Not on file  Social History Narrative   Not on file   Social Determinants of Health   Financial Resource Strain: Low Risk  (11/28/2021)   Overall Financial Resource Strain (CARDIA)    Difficulty of Paying Living Expenses: Not hard at all  Food Insecurity: No Food Insecurity (10/19/2022)   Hunger Vital Sign    Worried About Running Out of Food in the Last Year: Never true    Ran Out of Food in the Last Year: Never true  Transportation Needs: No Transportation Needs (10/19/2022)   PRAPARE - Administrator, Civil Service (Medical): No    Lack of Transportation (Non-Medical): No  Physical Activity: Inactive (11/28/2021)   Exercise Vital Sign    Days of Exercise per Week: 0 days    Minutes of Exercise per Session: 0 min  Stress: Stress Concern Present (11/28/2021)    [employer] of Occupational Health - Occupational Stress Questionnaire    Feeling of Stress : To some extent  Social Connections: Socially Integrated (11/28/2021)   Social Connection and Isolation Panel [NHANES]    Frequency of Communication with Friends and Family: More than three times a week    Frequency of Social Gatherings with Friends and Family: More than three times a week    Attends Religious Services: More than 4 times per year    Active Member of [employer] or Organizations: Yes    Attends Engineer, structural: More than 4 times per year    Marital Status: Married   Family History  Problem Relation Age of Onset   Arthritis Other  Heart disease Other    Cancer Other    Diabetes Other    OCD Other    Scheduled Meds:  citalopram  40 mg Oral Daily   cyanocobalamin  1,000 mcg Intramuscular Daily   heparin  5,000 Units Subcutaneous Q8H   levothyroxine  75 mcg Oral Q0600   liver oil-zinc oxide   Topical TID   megestrol  400 mg Oral BID   multivitamin with minerals  1 tablet Oral q morning   nystatin   Topical TID   Ensure Max Protein  237 mL Oral BID BM   rOPINIRole  1 mg Oral BID   sodium bicarbonate  650 mg Oral TID   traZODone  50 mg Oral QHS   vancomycin  125 mg Oral QID   zinc sulfate  220 mg Oral q morning   Continuous Infusions:  ciprofloxacin 400 mg (10/20/22 2140)   PRN Meds:.acetaminophen **OR** acetaminophen, hydrOXYzine, ondansetron **OR** ondansetron (ZOFRAN) IV, oxyCODONE Medications Prior to Admission:  Prior to Admission medications   Medication Sig Start Date End Date Taking? Authorizing Provider  acetaminophen (TYLENOL) 325 MG tablet Take 2 tablets (650 mg total) by mouth every 6 (six) hours as needed for mild pain (or Fever >/= 101). 10/01/22  Yes Emokpae, Courage, MD  Amino Acids-Protein Hydrolys (PRO-STAT) LIQD Take 30 mLs by mouth 2 (two) times daily.   Yes [provider]  ascorbic acid (VITAMIN C) 500 MG tablet Take 500 mg by  mouth 2 (two) times daily.   Yes [provider]  Cholecalciferol (VITAMIN D3) 50 MCG (2000 UT) CAPS Take 2,000 Units by mouth every morning.   Yes [provider]  citalopram (CELEXA) 40 MG tablet Take 1 tablet (40 mg total) by mouth daily. 05/06/22  Yes Martin, Mary-Margaret, FNP  feeding supplement, ENSURE COMPLETE, (ENSURE COMPLETE) LIQD Take 237 mLs by mouth 2 (two) times daily between meals. 10/01/22  Yes Shon Hale, MD  ferrous sulfate 325 (65 FE) MG tablet Take 1 tablet (325 mg total) by mouth daily with breakfast. 10/01/22  Yes Emokpae, Courage, MD  glucose 4 GM chewable tablet Chew 1 tablet (4 g total) by mouth as needed for low blood sugar. 10/01/22  Yes Emokpae, Courage, MD  levothyroxine (SYNTHROID) 75 MCG tablet Take 1 tablet (75 mcg total) by mouth daily. 05/07/22  Yes Martin, Mary-Margaret, FNP  Magnesium Oxide 400 MG CAPS Take 1 capsule (400 mg total) by mouth daily. 09/04/22  Yes Burnadette Pop, MD  megestrol (MEGACE) 400 MG/10ML suspension Take 10 mLs (400 mg total) by mouth 2 (two) times daily. For appetite stimulation 10/01/22  Yes Emokpae, Courage, MD  Multiple Vitamin (MULTIVITAMIN) tablet Take 1 tablet by mouth every morning.   Yes [provider]  nutrition supplement, JUVEN, (JUVEN) PACK Take 1 packet by mouth 2 (two) times daily between meals.   Yes [provider]  nystatin (MYCOSTATIN/NYSTOP) powder Apply topically 3 (three) times daily. On groins 09/04/22  Yes Adhikari, Amrit, MD  OxyCODONE HCl, Abuse Deter, (OXAYDO) 5 MG TABA Take 5 mg by mouth daily as needed (severe pain). 10/01/22  Yes Emokpae, Courage, MD  rOPINIRole (REQUIP XL) 2 MG 24 hr tablet Take 2 mg by mouth at bedtime.   Yes [provider]  sodium bicarbonate 650 MG tablet Take 1 tablet (650 mg total) by mouth 3 (three) times daily. 10/01/22 10/01/23 Yes Emokpae, Courage, MD  traMADol (ULTRAM) 50 MG tablet Take 1 tablet (50 mg total) by mouth every 12 (twelve) hours  as  needed for moderate pain. 10/01/22 10/01/23 Yes Emokpae, Courage, MD  traZODone (DESYREL) 50 MG tablet Take 1 tablet (50 mg total) by mouth at bedtime. 10/01/22  Yes Emokpae, Courage, MD  vancomycin (VANCOCIN) 125 MG capsule Take 1 capsule (125 mg total) by mouth See admin instructions. Take Vancomycin  125 mg--1 capsule 4 times a day for 14 days, followed by 1 capsule twice daily for 7 days, then 1 capsule once daily for 7 days, then 1 capsule every other day for 7 days, then 1 capsule every 3 days for 7 days then Stop Patient taking differently: Take 125 mg by mouth in the morning and at bedtime. for 7 days then Stop for c-diff 10/01/22  Yes Emokpae, Courage, MD  Zinc 220 (50 Zn) MG CAPS Take 220 mg by mouth every morning. 10/01/22  Yes Shon Hale, MD   Allergies  Allergen Reactions   Celebrex [Celecoxib] Swelling   Keflet [Cephalexin] Swelling   Penicillins Hives and Swelling   Sulfa Antibiotics Swelling   Kenalog [Triamcinolone Acetonide]     unknown   Latex Other (See Comments)    No reaction listed on MAR   Lisinopril Other (See Comments)    unknown   Mobic [Meloxicam]     SWELLING   Penicillin G Other (See Comments)    No reaction listed on MAR   Review of Systems  Unable to perform ROS: Age    Physical Exam Vitals and nursing note reviewed.  Constitutional:      General: She is not in acute distress.    Appearance: She is normal weight. She is ill-appearing.  Pulmonary:     Effort: Pulmonary effort is normal. No respiratory distress.  Skin:    General: Skin is warm and dry.     Comments: Stage IV sacral decub  Neurological:     Mental Status: She is alert and oriented to person, place, and time.  Psychiatric:        Mood and Affect: Mood normal.        Behavior: Behavior normal.     Vital Signs: BP 106/68 (BP Location: Left Arm)   Pulse 86   Temp (!) 100.5 F (38.1 C)   Resp 20   Ht 5\' 2"  (1.575 m)   Wt 70.5 kg   SpO2 97%   BMI 28.43 kg/m  Pain Scale: 0-10    Pain Score: 9    SpO2: SpO2: 97 % O2 Device:SpO2: 97 % O2 Flow Rate: .   IO: Intake/output summary:  Intake/Output Summary (Last 24 hours) at 10/21/2022 1322 Last data filed at 10/21/2022 1610 Gross per 24 hour  Intake 720 ml  Output 400 ml  Net 320 ml    LBM: Last BM Date : 10/20/22 Baseline Weight: Weight: 67.5 kg Most recent weight: Weight: 70.5 kg     Palliative Assessment/Data:     Time In: 1230 Time Out: 1345 Time Total: 75 minutes  Greater than 50%  of this time was spent counseling and coordinating care related to the above assessment and plan.  Signed by: Katheran Awe, NP   Please contact Palliative Medicine Team phone at [phone] for questions and concerns.  For individual provider: See Loretha Stapler

## 2022-10-21 NOTE — Progress Notes (Signed)
Progress Note   Patient: [name] HYQ:[id number] DOB: [birth year] DOA: 10/19/2022     2 DOS: the patient was seen and examined on 10/21/2022   Brief hospital admission narrative : Patient admitted from the skilled nursing facility secondary to general malaise and fever.  Recent admission approximately 2 weeks ago secondary to C. difficile infection.  Patient actively taking vancomycin tapering.  Still experiencing diarrhea.  Workup during this admission suggesting the presence of UTI.  Admitted for IV antibiotics and further evaluation/management.  Assessment and Plan: * UTI (urinary tract infection) -Slightly associated with ongoing diarrhea from C. difficile infection -Following patient's allergy history continue treatment with ciprofloxacin. -Maintain adequate hydration -Continue supportive care -Follow urine culture results. -Still demonstrating low-grade temperature; continue as needed antipyretics.  Dementia with behavioral disturbance (HCC) -Continue supportive care and considering tension -Mood currently stable -Continue the use of Celexa -Patient demonstrating poor insight of her condition. -Follow clinical response. -Palliative care consultation requested.  Pulmonary vascular congestion - Worsening vascular congestion - Cardiomegaly present - Pleural effusion present - Previous echo showed normal LV function - Peripheral edema present/chronic stasis dermatitis appreciated. - Follow echo results. -Follow daily weights/strict I's and O's -Patient reports no shortness of breath and there is no oxygen requirement at the moment.  C. difficile colitis -Continue p.o. vancomycin; in the setting of new antibiotics requirement and is still ongoing diarrhea we will pursuit full dose of vancomycin therapy.  -Continue supportive care maintain adequate hydration.  Thyroid disease -Continue Synthroid  AKI (acute kidney injury) (HCC) - Most likely due to dehydration/GI losses -  After fluid resuscitation and supportive care creatinine essentially back to baseline -Continue to minimize nephrotoxic agents as much as possible, avoid the use of contrast and prevent hypotension. -Continue to follow renal function trend.   -Continue the use of sodium bicarbonate (patient with mild metabolic acidosis component).  Decubitus ulcer -Wound VAC removed at SNF per report - Appreciate assistance and recommendation by wound care service -Continue constant repositioning, wound care and preventive barriers.  Anemia - Anemia of chronic disease as prior anemia panel evaluation -Fecal occult blood test was positive; most likely in the setting of acute C. difficile colitis. -Current hemoglobin 9.0 -Appreciate assistance and recommendation by gastroenterology service; for now no endoscopic evaluation acutely will be pursued. -Continue to follow hemoglobin trend.  Anxiety - Continue as needed Atarax -Patient also chronically on Celexa. -Continue supportive care and constant reorientation.  Depression - Stable mood; no active hallucination -Continue the use of Celexa and trazodone.   B12 deficiency anemia -B12 152 -will start supplementation.  Subjective:  Low-grade fever overnight; no nausea, no vomiting, no chest pain.  Good saturation on room air.  Reports feeling better.  Physical Exam: Vitals:   10/20/22 1200 10/20/22 1600 10/20/22 2133 10/21/22 0550  BP: 120/68 119/70 108/65 106/68  Pulse: 81 77 83 86  Resp: 18 18 20 20   Temp: 99.4 F (37.4 C) 99.7 F (37.6 C) 99.2 F (37.3 C) (!) 100.5 F (38.1 C)  TempSrc: Oral Oral    SpO2: 99% 99% 98% 97%  Weight:      Height:       General exam: Alert, awake, oriented x 2; reports feeling better today; no CP, nausea or vomiting. Continue to have diarrhea. Respiratory system: Good air movement bilaterally; no using accessory muscles.  Good saturation Cardiovascular system:RRR. No rubs or gallops; no  JVD. Gastrointestinal system: Abdomen is nondistended, soft and nontender. No organomegaly or masses felt. Normal bowel sounds  heard. Central nervous system: Alert and following commands appropriately; no focal neurological deficits.  Generally weak and deconditioned. Extremities: No cyanosis or clubbing; bilateral stasis dermatitis appreciated; positive left heel stage 2 wound without active drainage. Skin: No petechiae; stage III-IV sacral and buttocks pressure ulcer in place, without significant surrounded erythema and with no active drainage currently. Psychiatry: Judgement and insight appear impaired secondary to underlying dementia.  Data Reviewed: CBC: White blood cell 7.2, hemoglobin 9.0, platelet count 230 K Magnesium: 1.8 Comprehensive metabolic panel: Sodium 133, potassium 4.3, chloride 104, bicarb 17, BUN 55, creatinine 2.09, AST/ALT 37, alkaline phosphatase 188 and anion gap 12. B12: 152 Anemia panel: Demonstrating anemia of chronic disease; ferritin 916.   Family Communication: no family at bedside.  Disposition: Status is: Inpatient Remains inpatient appropriate because: continue IV antibiotics.   Planned Discharge Destination: Skilled nursing facility  Time spent: 40 minutes  Author: Vassie Loll, MD 10/21/2022 8:06 AM  For on call review [email or web address].

## 2022-10-22 DIAGNOSIS — D649 Anemia, unspecified: Secondary | ICD-10-CM | POA: Diagnosis not present

## 2022-10-22 DIAGNOSIS — Z515 Encounter for palliative care: Secondary | ICD-10-CM | POA: Diagnosis not present

## 2022-10-22 DIAGNOSIS — A0471 Enterocolitis due to Clostridium difficile, recurrent: Secondary | ICD-10-CM

## 2022-10-22 DIAGNOSIS — N3000 Acute cystitis without hematuria: Secondary | ICD-10-CM | POA: Diagnosis not present

## 2022-10-22 DIAGNOSIS — E079 Disorder of thyroid, unspecified: Secondary | ICD-10-CM | POA: Diagnosis not present

## 2022-10-22 DIAGNOSIS — Z7189 Other specified counseling: Secondary | ICD-10-CM | POA: Diagnosis not present

## 2022-10-22 DIAGNOSIS — F419 Anxiety disorder, unspecified: Secondary | ICD-10-CM | POA: Diagnosis not present

## 2022-10-22 LAB — CBC
HCT: 25.1 % — ABNORMAL LOW (ref 36.0–46.0)
Hemoglobin: 7.8 g/dL — ABNORMAL LOW (ref 12.0–15.0)
MCH: 29.5 pg (ref 26.0–34.0)
MCHC: 31.1 g/dL (ref 30.0–36.0)
MCV: 95.1 fL (ref 80.0–100.0)
Platelets: 186 10*3/uL (ref 150–400)
RBC: 2.64 MIL/uL — ABNORMAL LOW (ref 3.87–5.11)
RDW: 14.6 % (ref 11.5–15.5)
WBC: 10.4 10*3/uL (ref 4.0–10.5)
nRBC: 0 % (ref 0.0–0.2)

## 2022-10-22 LAB — BASIC METABOLIC PANEL
Anion gap: 11 (ref 5–15)
BUN: 56 mg/dL — ABNORMAL HIGH (ref 8–23)
CO2: 17 mmol/L — ABNORMAL LOW (ref 22–32)
Calcium: 7.4 mg/dL — ABNORMAL LOW (ref 8.9–10.3)
Chloride: 102 mmol/L (ref 98–111)
Creatinine, Ser: 2.17 mg/dL — ABNORMAL HIGH (ref 0.44–1.00)
GFR, Estimated: 23 mL/min — ABNORMAL LOW (ref >60)
Glucose, Bld: 82 mg/dL (ref 70–99)
Potassium: 4 mmol/L (ref 3.5–5.1)
Sodium: 130 mmol/L — ABNORMAL LOW (ref 135–145)

## 2022-10-22 LAB — CULTURE, BLOOD (ROUTINE X 2)

## 2022-10-22 LAB — URINE CULTURE

## 2022-10-22 MED ORDER — FUROSEMIDE 10 MG/ML IJ SOLN
20.0000 mg | Freq: Once | INTRAMUSCULAR | Status: AC
  Administered 2022-10-22: 20 mg via INTRAVENOUS
  Filled 2022-10-22: qty 2

## 2022-10-22 MED ORDER — CHLORHEXIDINE GLUCONATE CLOTH 2 % EX PADS
6.0000 | MEDICATED_PAD | Freq: Every day | CUTANEOUS | Status: DC
  Administered 2022-10-22 – 2022-10-27 (×6): 6 via TOPICAL

## 2022-10-22 NOTE — Progress Notes (Signed)
Progress Note   Patient: [name] ZOX:[id number] DOB: 01/12/1946 DOA: 10/19/2022     3 DOS: the patient was seen and examined on 10/22/2022   Brief hospital admission narrative : Patient admitted from the skilled nursing facility secondary to general malaise and fever.  Recent admission approximately 2 weeks ago secondary to C. difficile infection.  Patient actively taking vancomycin tapering.  Still experiencing diarrhea.  Workup during this admission suggesting the presence of UTI.  Admitted for IV antibiotics and further evaluation/management.  Assessment and Plan: * UTI (urinary tract infection) -Slightly associated with ongoing diarrhea from C. difficile infection -Following patient's allergy history continue treatment with ciprofloxacin. -Maintain adequate hydration -Continue supportive care -Follow urine culture results. -Still demonstrating low-grade temperature; continue as needed antipyretics.  Dementia with behavioral disturbance (HCC) -Continue supportive care and considering tension -Mood currently stable -Continue the use of Celexa -Patient demonstrating poor insight of her condition. -Follow clinical response. -Palliative care consultation requested.  Pulmonary vascular congestion - Worsening vascular congestion - Cardiomegaly present - Pleural effusion present - Previous echo showed normal LV function - Peripheral edema present/chronic stasis dermatitis appreciated. - Follow echo results. -Follow daily weights/strict I's and O's -Patient reports no shortness of breath and there is no oxygen requirement at the moment.  C. difficile colitis -Continue p.o. vancomycin; in the setting of new antibiotics requirement and is still ongoing diarrhea we will pursuit full dose of vancomycin therapy.  -Continue supportive care maintain adequate hydration.  Thyroid disease -Continue Synthroid  AKI (acute kidney injury) (HCC) - Most likely due to dehydration/GI losses -  After fluid resuscitation and supportive care creatinine essentially back to baseline -Continue to minimize nephrotoxic agents as much as possible, avoid the use of contrast and prevent hypotension. -Continue to follow renal function trend.   -Continue the use of sodium bicarbonate (patient with mild metabolic acidosis component).  Decubitus ulcer -Wound VAC removed at SNF per report - Appreciate assistance and recommendation by wound care service -Continue constant repositioning, wound care and preventive barriers.  Anemia - Anemia of chronic disease as prior anemia panel evaluation -Fecal occult blood test was positive; most likely in the setting of acute C. difficile colitis. -Current hemoglobin 9.0 -Appreciate assistance and recommendation by gastroenterology service; for now no endoscopic evaluation acutely will be pursued. -Continue to follow hemoglobin trend.  Anxiety - Continue as needed Atarax -Patient also chronically on Celexa. -Continue supportive care and constant reorientation.  Depression - Stable mood; no active hallucination -Continue the use of Celexa and trazodone.   B12 deficiency anemia -B12 152 -will continue supplementation (intramuscular injection daily x 7 days; then weekly x 4; then monthly). -Repeat B12 level in 4-6 months.  Lower extremity lymphedema/stasis dermatitis -No frank appreciation for cellulitic process or breakdown in her lower extremity skin. -Continue leg elevation, low-sodium diet and when amenable to use of TED hoses/Unna boot therapy.  Subjective:  Positive low-grade temperature overnight; no nausea, no vomiting.  Significant lower extremity swelling and lymphedema/stasis dermatitis appreciated bilaterally (right more than left).  Patient reporting improvement in her diarrhea.  No dysuria currently.  Physical Exam: Vitals:   10/21/22 1455 10/21/22 2044 10/22/22 0500 10/22/22 1337  BP: 95/60 (!) 109/56 98/64 104/69  Pulse: 75 80 80  74  Resp: 16 16 16 17   Temp: 99.7 F (37.6 C) 99.7 F (37.6 C) 100.3 F (37.9 C) 98.6 F (37 C)  TempSrc: Oral Oral Oral Oral  SpO2: 97% 96% 97% 100%  Weight:  Height:       General exam: Alert, awake, oriented x 2, reporting bilateral lower extremity swelling; no chest pain, no nausea, no vomiting.  Feeling better and no major distress.  Low-grade temperature appreciated overnight. Respiratory system: Clear to auscultation. Respiratory effort normal.  Good saturation on room air.  No using accessory muscle. Cardiovascular system: No rubs or gallops. Gastrointestinal system: Abdomen is nondistended, soft and nontender. No organomegaly or masses felt. Normal bowel sounds heard. Central nervous system:  No focal neurological deficits. Extremities: Bilateral lower extremity edema with venous stasis dermatitis; right lower extremity and erythematosus than her left lower extremity. Skin: No petechiae; stage III-IV sacral ulcer without significant drainage.  Present at time of admission. Psychiatry: Judgement and insight appear normal. Mood & affect appropriate.   Data Reviewed: CBC: White blood cell 10.4, hemoglobin 7.8 and platelet count 186 K Basic metabolic panel: Sodium 130, potassium 4.0, chloride 102, bicarb 17, BUN 56, creatinine 2.17 and GFR 23.  Family Communication: no family at bedside.  Disposition: Status is: Inpatient Remains inpatient appropriate because: continue IV antibiotics.   Planned Discharge Destination: Skilled nursing facility  Time spent: 40 minutes  Author: Vassie Loll, MD 10/22/2022 4:57 PM  For on call review [email or web address].

## 2022-10-22 NOTE — TOC Progression Note (Signed)
Transition of Care [facility]) - Progression Note    Patient Details  Name: [name] MRN: [id number] Date of Birth: 07/11/1945  Transition of Care [facility]) CM/SW Contact  Elliot Gault, LCSW Phone Number: 10/22/2022, 11:58 AM  Clinical Narrative:     TOC following. Per MD, anticipated dc date is tomorrow.   Spoke with pt and her son at bedside to review dc planning. They are both agreeable to return to [facility] at [employer]. Pt will need EMS transport.  Son asking about getting a copy of pt's HCPOA on the chart. TOC copied the document and placed in hard chart at request of Chaplain who stated she would get it scanned in this afternoon.  Updated Heather at [facility] of likely [employer] and she states they are able to re-admit pt at dc.  TOC will follow.  Expected Discharge Plan: Skilled Nursing Facility Barriers to Discharge: Continued Medical Work up  Expected Discharge Plan and Services In-house Referral: Clinical Social Work   Post Acute Care Choice: Resumption of Svcs/PTA Provider Living arrangements for the past 2 months: Skilled Nursing Facility                                       Social Determinants of Health (SDOH) Interventions SDOH Screenings   Food Insecurity: No Food Insecurity (10/19/2022)  Housing: Low Risk  (10/19/2022)  Transportation Needs: No Transportation Needs (10/19/2022)  Utilities: Not At Risk (10/19/2022)  Alcohol Screen: Low Risk  (11/28/2021)  Depression (PHQ2-9): Low Risk  (05/06/2022)  Financial Resource Strain: Low Risk  (11/28/2021)  Physical Activity: Inactive (11/28/2021)  Social Connections: Socially Integrated (11/28/2021)  Stress: Stress Concern Present (11/28/2021)  Tobacco Use: Low Risk  (10/21/2022)    Readmission Risk Interventions    10/21/2022   10:12 AM 08/30/2022    3:36 PM  Readmission Risk Prevention Plan  Transportation Screening Complete Complete  PCP or Specialist Appt within 3-5 Days  Complete  Social Work Consult  for Recovery Care Planning/Counseling  Complete  Palliative Care Screening  Not Applicable  Medication Review Oceanographer) Complete Complete  HRI or Home Care Consult Complete   SW Recovery Care/Counseling Consult Complete   Palliative Care Screening Not Applicable   Skilled Nursing Facility Complete

## 2022-10-22 NOTE — Progress Notes (Signed)
Gastroenterology Progress Note    Primary Care Physician:  [name], FNP Primary Gastroenterologist:  Dr. Marletta Lor, previously unassigned.   Patient ID: [name]; [id number]; 09-Mar-1946    Subjective   No abdominal pain. Eating breakfast this morning. No N/V. No overt GI bleeding. No diarrhea this morning. One stool overnight. Bristol scale #6.    Objective   Vital signs in last 24 hours Temp:  [99.7 F (37.6 C)-100.3 F (37.9 C)] 100.3 F (37.9 C) (05/28 0500) Pulse Rate:  [75-80] 80 (05/28 0500) Resp:  [16] 16 (05/28 0500) BP: (95-109)/(56-64) 98/64 (05/28 0500) SpO2:  [96 %-97 %] 97 % (05/28 0500) Last BM Date : 10/21/22  Physical Exam General:   Alert and oriented, pleasant Head:  Normocephalic and atraumatic. Eyes:  No icterus, sclera clear. Conjuctiva pink.  Abdomen:  Bowel sounds present, soft, non-tender, non-distended. No HSM or hernias noted. No rebound or guarding. No masses appreciated  Neurologic:  Alert and  oriented x4    Intake/Output from previous day: 05/27 0701 - 05/28 0700 In: 800 [P.O.:600; IV Piggyback:200] Out: -  Intake/Output this shift: No intake/output data recorded.  Lab Results  Recent Labs    10/19/22 1553 10/20/22 0417 10/22/22 0408  WBC 12.4* 10.2 10.4  HGB 8.3* 9.0* 7.8*  HCT 26.1* 28.7* 25.1*  PLT 224 230 186   BMET Recent Labs    10/19/22 1553 10/20/22 0417 10/22/22 0408  NA 132* 133* 130*  K 4.2 4.3 4.0  CL 103 104 102  CO2 19* 17* 17*  GLUCOSE 102* 73 82  BUN 59* 55* 56*  CREATININE 2.20* 2.09* 2.17*  CALCIUM 7.8* 7.7* 7.4*   LFT Recent Labs    10/19/22 1553 10/20/22 0417  PROT 5.5* 5.5*  ALBUMIN 2.1* 2.0*  AST 39 37  ALT 38 37  ALKPHOS 180* 188*  BILITOT 0.9 0.9   Lab Results  Component Value Date   FERRITIN 916 (H) 10/20/2022   Lab Results  Component Value Date   IRON 8 (L) 10/20/2022   TIBC 176 (L) 10/20/2022   FERRITIN 916 (H) 10/20/2022      Studies/Results ECHOCARDIOGRAM COMPLETE  Result Date: 10/21/2022    ECHOCARDIOGRAM REPORT   Patient Name:   [name] Date of Exam: 10/21/2022 Medical Rec #:  [id number]    Height:       62.0 in Accession #:    [id number]   Weight:       155.4 lb Date of Birth:  Feb 23, 1946     BSA:          1.717 m Patient Age:    77 years     BP:           106/68 mmHg Patient Gender: F            HR:           80 bpm. [facility] Procedure: 2D Echo, Color Doppler and Cardiac Doppler Indications:    I50.9* Heart failure (unspecified)  History:        Patient has prior history of Echocardiogram examinations, most                 recent 05/21/2022. COPD; Risk Factors:Hypertension and                 Dyslipidemia.  Sonographer:    Irving Burton Senior RDCS Referring Phys: [id number] ASIA B ZIERLE-GHOSH IMPRESSIONS  1. Left ventricular ejection fraction, by estimation, is 65 to 70%.  The left ventricle has normal function. The left ventricle has no regional wall motion abnormalities. Left ventricular diastolic parameters are consistent with Grade I diastolic dysfunction (impaired relaxation).  2. Right ventricular systolic function is normal. The right ventricular size is normal. There is mildly elevated pulmonary artery systolic pressure. The estimated right ventricular systolic pressure is 40.0 mmHg.  3. Left atrial size was moderately dilated.  4. The mitral valve is normal in structure. Trivial mitral valve regurgitation.  5. The aortic valve is tricuspid. Aortic valve regurgitation is trivial. Aortic valve sclerosis/calcification is present, without any evidence of aortic stenosis.  6. The inferior vena cava is dilated in size with <50% respiratory variability, suggesting right atrial pressure of 15 mmHg.  7. Cannot exclude a small PFO. Comparison(s): Changes from prior study are noted. 05/21/2022: LVEF 60-65%, normal diastolic function. FINDINGS  Left Ventricle: Left ventricular ejection fraction, by estimation, is  65 to 70%. The left ventricle has normal function. The left ventricle has no regional wall motion abnormalities. The left ventricular internal cavity size was normal in size. There is  no left ventricular hypertrophy. Left ventricular diastolic parameters are consistent with Grade I diastolic dysfunction (impaired relaxation). Indeterminate filling pressures. Right Ventricle: The right ventricular size is normal. No increase in right ventricular wall thickness. Right ventricular systolic function is normal. There is mildly elevated pulmonary artery systolic pressure. The tricuspid regurgitant velocity is 2.50  m/s, and with an assumed right atrial pressure of 15 mmHg, the estimated right ventricular systolic pressure is 40.0 mmHg. Left Atrium: Left atrial size was moderately dilated. Right Atrium: Right atrial size was normal in size. Pericardium: Trivial pericardial effusion is present. Mitral Valve: The mitral valve is normal in structure. Trivial mitral valve regurgitation. Tricuspid Valve: The tricuspid valve is normal in structure. Tricuspid valve regurgitation is mild. Aortic Valve: The aortic valve is tricuspid. Aortic valve regurgitation is trivial. Aortic valve sclerosis/calcification is present, without any evidence of aortic stenosis. Aortic valve mean gradient measures 7.0 mmHg. Aortic valve peak gradient measures 14.3 mmHg. Aortic valve area, by VTI measures 1.96 cm. Pulmonic Valve: The pulmonic valve was normal in structure. Pulmonic valve regurgitation is not visualized. Aorta: The aortic root and ascending aorta are structurally normal, with no evidence of dilitation. Venous: The inferior vena cava is dilated in size with less than 50% respiratory variability, suggesting right atrial pressure of 15 mmHg. IAS/Shunts: Cannot exclude a small PFO.  LEFT VENTRICLE PLAX 2D LVIDd:         4.20 cm   Diastology LVIDs:         2.60 cm   LV e' medial:    6.85 cm/s LV PW:         0.90 cm   LV E/e' medial:  11.6  LV IVS:        0.80 cm   LV e' lateral:   6.85 cm/s LVOT diam:     1.80 cm   LV E/e' lateral: 11.6 LV SV:         65 LV SV Index:   38 LVOT Area:     2.54 cm  RIGHT VENTRICLE RV S prime:     15.60 cm/s TAPSE (M-mode): 2.3 cm LEFT ATRIUM             Index        RIGHT ATRIUM           Index LA diam:        5.30 cm 3.09 cm/m  RA Area:     16.50 cm LA Vol (A2C):   71.6 ml 41.69 ml/m  RA Volume:   39.40 ml  22.94 ml/m LA Vol (A4C):   75.2 ml 43.79 ml/m LA Biplane Vol: 74.3 ml 43.26 ml/m  AORTIC VALVE AV Area (Vmax):    1.83 cm AV Area (Vmean):   1.93 cm AV Area (VTI):     1.96 cm AV Vmax:           189.00 cm/s AV Vmean:          124.000 cm/s AV VTI:            0.333 m AV Peak Grad:      14.3 mmHg AV Mean Grad:      7.0 mmHg LVOT Vmax:         136.00 cm/s LVOT Vmean:        94.200 cm/s LVOT VTI:          0.257 m LVOT/AV VTI ratio: 0.77  AORTA Ao Root diam: 2.60 cm Ao Asc diam:  3.30 cm MITRAL VALVE               TRICUSPID VALVE MV Area (PHT): 2.88 cm    TR Peak grad:   25.0 mmHg MV Decel Time: 263 msec    TR Vmax:        250.00 cm/s MV E velocity: 79.70 cm/s MV A velocity: 66.00 cm/s  SHUNTS MV E/A ratio:  1.21        Systemic VTI:  0.26 m                            Systemic Diam: 1.80 cm Zoila Shutter MD Electronically signed by Zoila Shutter MD Signature Date/Time: 10/21/2022/5:22:30 PM    Final    CT ABDOMEN PELVIS WO CONTRAST  Result Date: 10/19/2022 CLINICAL DATA:  Fever. Weakness for couple of days. Recently diagnosis C diff and diarrhea. Sacral wound. EXAM: CT ABDOMEN AND PELVIS WITHOUT CONTRAST TECHNIQUE: Multidetector CT imaging of the abdomen and pelvis was performed following the standard protocol without IV contrast. RADIATION DOSE REDUCTION: This exam was performed according to the departmental dose-optimization program which includes automated exposure control, adjustment of the mA and/or kV according to patient size and/or use of iterative reconstruction technique. COMPARISON:  CT  examination dated January 17, 2022 FINDINGS: Lower chest: Small bilateral pleural effusions left greater than the right with left basilar atelectasis. Moderate-sized hiatal hernia. Hepatobiliary: No focal liver abnormality is seen. Status post cholecystectomy. No biliary dilatation. Pancreas: Unremarkable. No pancreatic ductal dilatation or surrounding inflammatory changes. Spleen: Normal in size without focal abnormality. Adrenals/Urinary Tract: Adrenal glands are unremarkable. Kidneys are normal, without renal calculi, focal lesion, or hydronephrosis. Bladder is unremarkable. Stomach/Bowel: Stomach is within normal limits. Appendix appears normal. No evidence of bowel wall thickening, distention, or inflammatory changes. Moderate amount of stool throughout the colon. Vascular/Lymphatic: Aortic atherosclerosis. No enlarged abdominal or pelvic lymph nodes. Reproductive: Status post hysterectomy. No adnexal masses. Other: Marked generalized anasarca. No abdominopelvic ascites. Skin thickening and decubitus sacral ulcer. Musculoskeletal: Posterior spinal and interbody no acute osseous abnormality. Fusion at L4-S1 IMPRESSION: 1. Marked generalized anasarca. 2. Small bilateral pleural effusions left greater than the right with left basilar atelectasis. 3. Moderate-sized hiatal hernia. 4. No CT evidence of acute abdominal/pelvic process. 5. Skin thickening and decubitus sacral ulcer. 6. Aortic atherosclerosis. Aortic Atherosclerosis (ICD10-I70.0). Electronically Signed   By: Miles Costain.O.  On: 10/19/2022 17:44   DG Chest Portable 1 View  Result Date: 10/19/2022 CLINICAL DATA:  Fever. Weakness. EXAM: PORTABLE CHEST 1 VIEW COMPARISON:  Radiograph 09/30/2022 FINDINGS: Stable cardiomegaly. Again seen aortic atherosclerosis and tortuosity. Retrocardiac hiatal hernia. Similar right pleural effusion. Improved left pleural effusion. Vascular congestion again seen, slight worsening. No confluent consolidation. The bones  are diffusely under mineralized. IMPRESSION: 1. No acute airspace disease to account for fever. 2. Stable cardiomegaly. Similar right pleural effusion. Improved left pleural effusion. 3. Worsening vascular congestion. Electronically Signed   By: Narda Rutherford M.D.   On: 10/19/2022 16:26   DG Shoulder Right  Result Date: 10/07/2022 Right shoulder Patient presents with traumatic onset of right shoulder pain with ecchymosis around the shoulder along the lateral arm Trauma history is remote perhaps fell on 6 May could have been 2 May April 30 she was evaluated as well is April 4 The glenohumeral joint remains intact as does Shenton's line of the shoulder there is osteopenia There appears to be a fracture of the distal clavicle it appears to be probably a type I or arthritis is seen in the [facility] joint area there is prominence of the distal clavicle in relation to the acromion On the lateral view of the shoulder i.e. scapular Y there is a fracture of the distal clavicle it is probably grade 1 distal clavicle fracture   DG CHEST PORT 1 VIEW  Result Date: 09/30/2022 CLINICAL DATA:  PICC.  Removed prior to, to the hospital EXAM: PORTABLE CHEST 1 VIEW COMPARISON:  X-ray 07/06/2022 FINDINGS: Enlarged cardiopericardial silhouette with tortuous and ectatic aorta. Film is rotated to the right. Small effusions with mild adjacent opacity. Slight central vascular congestion. No pneumothorax. Overlapping cardiac leads. Osteopenia. Moderate hiatal hernia IMPRESSION: Rotated radiograph. Bilateral small effusions with the adjacent opacities. Enlarged heart with vascular congestion Electronically Signed   By: Karen Kays M.D.   On: 09/30/2022 20:07   CT Head Wo Contrast  Result Date: 09/26/2022 CLINICAL DATA:  Delirium.  Altered mental status. EXAM: CT HEAD WITHOUT CONTRAST TECHNIQUE: Contiguous axial images were obtained from the base of the skull through the vertex without intravenous contrast. RADIATION DOSE REDUCTION: This  exam was performed according to the departmental dose-optimization program which includes automated exposure control, adjustment of the mA and/or kV according to patient size and/or use of iterative reconstruction technique. COMPARISON:  Head CT 09/24/2022. FINDINGS: Brain: No acute hemorrhage. Unchanged mild chronic small-vessel disease. Cortical gray-white differentiation is otherwise preserved. No hydrocephalus or extra-axial collection. No mass effect or midline shift. Vascular: No hyperdense vessel or unexpected calcification. Skull: No calvarial fracture or suspicious bone lesion. Skull base is unremarkable. Sinuses/Orbits: Mucous retention cysts in the left maxillary and left inferior frontal sinus. Orbits are unremarkable. Other: None. IMPRESSION: 1. No acute intracranial abnormality. 2. Unchanged mild chronic small-vessel disease. Electronically Signed   By: Orvan Falconer M.D.   On: 09/26/2022 12:15   CT Head Wo Contrast  Result Date: 09/24/2022 CLINICAL DATA:  Head trauma EXAM: CT HEAD WITHOUT CONTRAST CT CERVICAL SPINE WITHOUT CONTRAST TECHNIQUE: Multidetector CT imaging of the head and cervical spine was performed following the standard protocol without intravenous contrast. Multiplanar CT image reconstructions of the cervical spine were also generated. RADIATION DOSE REDUCTION: This exam was performed according to the departmental dose-optimization program which includes automated exposure control, adjustment of the mA and/or kV according to patient size and/or use of iterative reconstruction technique. COMPARISON:  None Available. FINDINGS: CT HEAD FINDINGS Brain: There  is no mass, hemorrhage or extra-axial collection. There is generalized atrophy without lobar predilection. There is hypoattenuation of the periventricular white matter, most commonly indicating chronic ischemic microangiopathy. Vascular: No abnormal hyperdensity of the major intracranial arteries or dural venous sinuses. No  intracranial atherosclerosis. Skull: The visualized skull base, calvarium and extracranial soft tissues are normal. Sinuses/Orbits: No fluid levels or advanced mucosal thickening of the visualized paranasal sinuses. No mastoid or middle ear effusion. The orbits are normal. CT CERVICAL SPINE FINDINGS Alignment: Grade 1 anterolisthesis at C5-6. Skull base and vertebrae: No acute fracture. Soft tissues and spinal canal: No prevertebral fluid or swelling. No visible canal hematoma. Disc levels: No advanced spinal canal or neural foraminal stenosis. Upper chest: No pneumothorax, pulmonary nodule or pleural effusion. Other: Normal visualized paraspinal cervical soft tissues. IMPRESSION: 1. No acute intracranial abnormality. 2. Generalized atrophy and chronic ischemic microangiopathy. 3. No acute fracture or static subluxation of the cervical spine. Electronically Signed   By: Deatra Robinson M.D.   On: 09/24/2022 01:51   CT Cervical Spine Wo Contrast  Result Date: 09/24/2022 CLINICAL DATA:  Head trauma EXAM: CT HEAD WITHOUT CONTRAST CT CERVICAL SPINE WITHOUT CONTRAST TECHNIQUE: Multidetector CT imaging of the head and cervical spine was performed following the standard protocol without intravenous contrast. Multiplanar CT image reconstructions of the cervical spine were also generated. RADIATION DOSE REDUCTION: This exam was performed according to the departmental dose-optimization program which includes automated exposure control, adjustment of the mA and/or kV according to patient size and/or use of iterative reconstruction technique. COMPARISON:  None Available. FINDINGS: CT HEAD FINDINGS Brain: There is no mass, hemorrhage or extra-axial collection. There is generalized atrophy without lobar predilection. There is hypoattenuation of the periventricular white matter, most commonly indicating chronic ischemic microangiopathy. Vascular: No abnormal hyperdensity of the major intracranial arteries or dural venous  sinuses. No intracranial atherosclerosis. Skull: The visualized skull base, calvarium and extracranial soft tissues are normal. Sinuses/Orbits: No fluid levels or advanced mucosal thickening of the visualized paranasal sinuses. No mastoid or middle ear effusion. The orbits are normal. CT CERVICAL SPINE FINDINGS Alignment: Grade 1 anterolisthesis at C5-6. Skull base and vertebrae: No acute fracture. Soft tissues and spinal canal: No prevertebral fluid or swelling. No visible canal hematoma. Disc levels: No advanced spinal canal or neural foraminal stenosis. Upper chest: No pneumothorax, pulmonary nodule or pleural effusion. Other: Normal visualized paraspinal cervical soft tissues. IMPRESSION: 1. No acute intracranial abnormality. 2. Generalized atrophy and chronic ischemic microangiopathy. 3. No acute fracture or static subluxation of the cervical spine. Electronically Signed   By: Deatra Robinson M.D.   On: 09/24/2022 01:51    Assessment  77 y.o. female with a history of COPD, GERD, dyslipidemia, hypertension, decubitus ulcer, anxiety, depression, presenting with fever and weakness; she was found to have a UTI and worsening anemia from baseline. Currently receiving oral vanc due to recurrent Cdiff.   Cdiff: initially positive 4/5 and received vanc. 5/2 positive antigen and toxin. Prescribed course of vanc with taper at discharge on 5/7 (Vanc QID for 14 days, then BID for 7 days, then daily every other day for 7 days, then once every 3 days for 7 days). At this time in treatment course, taper would be vanc every other day. However, with need for abx due to UTI, QID regimen has been continued.  Recommend continuing QID during hospitalization and for one week following completion of UTI therapy, then decrease to BID for 7 days, then continue taper as previously outlined. Encouragingly,  stool frequency is decreased with one stool overnight and none thus far today.   Acute on chronic anemia: found to have Hgb 8.3  from baseline around 10 and heme positive. Remote colonoscopy per patient. Hgb 7.8 this morning but without overt GI bleeding. Suspect multifactorial and likely equilibrating. Can continue to monitor for now.     Plan / Recommendations  Colonoscopy/EGD as outpatient unless overt GI bleeding Supportive care Continue vanc at QID     LOS: 3 days    10/22/2022, 8:48 AM  Gelene Mink, PhD, ANP-BC [facility]

## 2022-10-22 NOTE — Progress Notes (Signed)
Palliative: [name] is sitting up in bed.  She appears acutely/chronically ill and quite frail.  She is alert and oriented, able to make her basic needs known.  There is no family at bedside at this time but GI nurse practitioner is present.  We talk about [name]'s acute and chronic health concerns.  We talk about the plan for returning to [facility] with rehab if needed.  We also talk about the benefits of outpatient palliative services.  At this point [name] is agreeable to have [facility] palliative team follow along.  We briefly talk about CODE STATUS again today, "treat the treatable, but allowing natural passing".  [name] states that she does not think that she would want to be on life support.  I encouraged her to continue these discussions when she returns to [facility].  Conference with attending, GI nurse practitioner, bedside nursing staff, transition of care team related to patient condition, needs, goals of care, disposition.  Plan: At this point continue to treat the treatable.  Return to [facility] under long-term care with their palliative services following.  35 minutes  Cindy Carmel, NP Palliative medicine team Team phone [phone] Greater than 50% of this time was spent counseling and coordinating care related to the above assessment and plan.

## 2022-10-23 ENCOUNTER — Inpatient Hospital Stay (HOSPITAL_COMMUNITY): Payer: Medicare Other

## 2022-10-23 DIAGNOSIS — F03918 Unspecified dementia, unspecified severity, with other behavioral disturbance: Secondary | ICD-10-CM | POA: Diagnosis not present

## 2022-10-23 DIAGNOSIS — A0472 Enterocolitis due to Clostridium difficile, not specified as recurrent: Secondary | ICD-10-CM | POA: Diagnosis not present

## 2022-10-23 DIAGNOSIS — N179 Acute kidney failure, unspecified: Secondary | ICD-10-CM | POA: Diagnosis not present

## 2022-10-23 LAB — CULTURE, BLOOD (ROUTINE X 2): Culture: NO GROWTH

## 2022-10-23 MED ORDER — DOXYCYCLINE HYCLATE 100 MG PO TABS
100.0000 mg | ORAL_TABLET | Freq: Two times a day (BID) | ORAL | Status: DC
  Administered 2022-10-23 – 2022-10-24 (×2): 100 mg via ORAL
  Filled 2022-10-23 (×2): qty 1

## 2022-10-23 NOTE — Progress Notes (Signed)
Patient slept most of the night during this shift. Dressing change completed this am. Patient is AOx3. Prn medication given x1. Preavlon boots on. Plan of care ongoing.

## 2022-10-23 NOTE — Progress Notes (Signed)
Progress Note   Patient: [name] MVH:[id number] DOB: 09/10/1945 DOA: 10/19/2022     4 DOS: the patient was seen and examined on 10/23/2022   Brief hospital admission narrative : Patient admitted from the skilled nursing facility secondary to general malaise and fever.  Recent admission approximately 2 weeks ago secondary to C. difficile infection.  Patient actively taking vancomycin tapering.  Still experiencing diarrhea.  Workup during this admission suggesting the presence of UTI.  Admitted for IV antibiotics and further evaluation/management.  Assessment and Plan: *Klebsiella UTI (urinary tract infection) --Multiple antibiotic allergies -Ongoing C. difficile infection -Pharmacist to see if she can come off Cipro  Dementia with behavioral disturbance (HCC) -Continue supportive care and considering tension -Mood currently stable -Continue the use of Celexa -Patient demonstrating poor insight of her condition. -Follow clinical response. -Palliative care consultation requested.  Pulmonary vascular congestion - Worsening vascular congestion - Cardiomegaly present - Pleural effusion present - Previous echo showed normal LV function - Peripheral edema present/chronic stasis dermatitis appreciated. - Follow echo results. -Follow daily weights/strict I's and O's -Patient reports no shortness of breath and there is no oxygen requirement at the moment.  C. difficile colitis -Continue p.o. vancomycin; in the setting of new antibiotics requirement and is still ongoing diarrhea we will pursuit full dose of vancomycin therapy.  -Continue supportive care maintain adequate hydration.  Thyroid disease -Continue Synthroid  AKI (acute kidney injury) (HCC) - Most likely due to dehydration/GI losses - After fluid resuscitation and supportive care creatinine essentially back to baseline -Continue to minimize nephrotoxic agents as much as possible, avoid the use of contrast and prevent  hypotension. -Continue to follow renal function trend.   -Continue the use of sodium bicarbonate (patient with mild metabolic acidosis component).  Decubitus ulcer -Wound VAC removed at SNF per report - Appreciate assistance and recommendation by wound care service -Continue constant repositioning, wound care and preventive barriers.  Anemia - Anemia of chronic disease as prior anemia panel evaluation -Fecal occult blood test was positive; most likely in the setting of acute C. difficile colitis. -Current hemoglobin 9.0 -Appreciate assistance and recommendation by gastroenterology service; for now no endoscopic evaluation acutely will be pursued. -Continue to follow hemoglobin trend.  Anxiety - Continue as needed Atarax -Patient also chronically on Celexa. -Continue supportive care and constant reorientation.  Depression - Stable mood; no active hallucination -Continue the use of Celexa and trazodone.   B12 deficiency anemia -B12 152 -will continue supplementation (intramuscular injection daily x 7 days; then weekly x 4; then monthly). -Repeat B12 level in 4-6 months.  -Right leg swelling and erythema noted--suspect cellulitis -Please see photos in epic -Get venous Dopplers -Discussion with pharmacist to see if we can use Keflex along with Doxy  Subjective:  -Right leg swelling and erythema noted No fever  Or chills  -Foley with clear urine at this time  Physical Exam: Vitals:   10/22/22 1337 10/22/22 2014 10/23/22 0309 10/23/22 1447  BP: 104/69 102/62 96/61 101/62  Pulse: 74 63 65 74  Resp: 17 18 18 18   Temp: 98.6 F (37 C) 98.7 F (37.1 C) 97.8 F (36.6 C) 98.6 F (37 C)  TempSrc: Oral Oral Oral Oral  SpO2: 100% 100% 99% 96%  Weight:      Height:       Physical Exam  Gen:- Awake Alert, in no acute distress  HEENT:- [place].AT, No sclera icterus Neck-Supple Neck,No JVD,.  Lungs-  CTAB , fair air movement bilaterally  CV- S1, S2  normal, RRR Abd-  +ve  B.Sounds, Abd Soft, No tenderness,    Extremity--- Rt leg swelling and erythema--- see photos below -good pedal pulses  Psych-affect is appropriate, oriented x3 Neuro-no new focal deficits, no tremors GU-Foley in situ with clear urine--apparently Foley was placed on 10/22/2022 to allow for wound healing Skin--sacral decub appears to be improving--see photo below  Media Information  Document Information  Photos  Sacral decub  10/23/2022 14:38  Attached To:  Hospital Encounter on 10/19/22  Source Information  Shon Hale, MD  Ap-Dept 300     Media Information  Document Information  Photos  Rt Leg redness and swelling  10/23/2022 14:37  Attached To:  Hospital Encounter on 10/19/22  Source Information  Shon Hale, MD  Ap-Dept 300   Family Communication: no family at bedside.  Disposition: SNF facility Status is: Inpatient Remains inpatient appropriate because: continue IV antibiotics.   Planned Discharge Destination: Skilled nursing facility  Author: Shon Hale, MD 10/23/2022 4:34 PM  For on call review [email or web address].

## 2022-10-23 NOTE — Progress Notes (Signed)
Referred to patient by [name] that patient son was not able to locate his mothers' HCPOA in Mychart and wanted to insure that it was indeed updated correctly. He has the paperwork present at this time. Spoke with son and updated her chart in EPIC to reflect what her HCPOA says as Health Care agent and first alternate. Her son [name] is her HCPOA and her sister Marily Memos is her first alternate HCPOA. Made sure a copy of this document was in her paper chart to be scanned in upon discharge and provided support to [place] today. Will remain available in order to provide spiritual support and to assess for spiritual need.   Rev. Jolyn Lent, M.Div Chaplain

## 2022-10-24 ENCOUNTER — Other Ambulatory Visit (HOSPITAL_COMMUNITY): Payer: Self-pay

## 2022-10-24 DIAGNOSIS — A0472 Enterocolitis due to Clostridium difficile, not specified as recurrent: Secondary | ICD-10-CM | POA: Diagnosis not present

## 2022-10-24 DIAGNOSIS — I82409 Acute embolism and thrombosis of unspecified deep veins of unspecified lower extremity: Secondary | ICD-10-CM | POA: Clinically undetermined

## 2022-10-24 DIAGNOSIS — I82402 Acute embolism and thrombosis of unspecified deep veins of left lower extremity: Secondary | ICD-10-CM | POA: Diagnosis not present

## 2022-10-24 DIAGNOSIS — N179 Acute kidney failure, unspecified: Secondary | ICD-10-CM | POA: Diagnosis not present

## 2022-10-24 LAB — CBC
HCT: 26.9 % — ABNORMAL LOW (ref 36.0–46.0)
Hemoglobin: 8.5 g/dL — ABNORMAL LOW (ref 12.0–15.0)
MCH: 29.8 pg (ref 26.0–34.0)
MCHC: 31.6 g/dL (ref 30.0–36.0)
MCV: 94.4 fL (ref 80.0–100.0)
Platelets: 266 10*3/uL (ref 150–400)
RBC: 2.85 MIL/uL — ABNORMAL LOW (ref 3.87–5.11)
RDW: 14.7 % (ref 11.5–15.5)
WBC: 12.4 10*3/uL — ABNORMAL HIGH (ref 4.0–10.5)
nRBC: 0 % (ref 0.0–0.2)

## 2022-10-24 LAB — BASIC METABOLIC PANEL
Anion gap: 8 (ref 5–15)
BUN: 55 mg/dL — ABNORMAL HIGH (ref 8–23)
CO2: 23 mmol/L (ref 22–32)
Calcium: 7.8 mg/dL — ABNORMAL LOW (ref 8.9–10.3)
Chloride: 100 mmol/L (ref 98–111)
Creatinine, Ser: 2.17 mg/dL — ABNORMAL HIGH (ref 0.44–1.00)
GFR, Estimated: 23 mL/min — ABNORMAL LOW (ref >60)
Glucose, Bld: 83 mg/dL (ref 70–99)
Potassium: 4.2 mmol/L (ref 3.5–5.1)
Sodium: 131 mmol/L — ABNORMAL LOW (ref 135–145)

## 2022-10-24 LAB — CULTURE, BLOOD (ROUTINE X 2): Culture: NO GROWTH

## 2022-10-24 LAB — HEPARIN LEVEL (UNFRACTIONATED): Heparin Unfractionated: <0.1 IU/mL — ABNORMAL LOW (ref 0.30–0.70)

## 2022-10-24 MED ORDER — HEPARIN (PORCINE) 25000 UT/250ML-% IV SOLN
1000.0000 [IU]/h | INTRAVENOUS | Status: DC
  Administered 2022-10-24: 1000 [IU]/h via INTRAVENOUS
  Filled 2022-10-24: qty 250

## 2022-10-24 MED ORDER — LINEZOLID 600 MG PO TABS
600.0000 mg | ORAL_TABLET | Freq: Two times a day (BID) | ORAL | Status: DC
  Administered 2022-10-24 – 2022-10-28 (×8): 600 mg via ORAL
  Filled 2022-10-24 (×13): qty 1

## 2022-10-24 MED ORDER — HEPARIN (PORCINE) 25000 UT/250ML-% IV SOLN
1350.0000 [IU]/h | INTRAVENOUS | Status: DC
  Administered 2022-10-24 – 2022-10-25 (×2): 1200 [IU]/h via INTRAVENOUS
  Filled 2022-10-24: qty 250

## 2022-10-24 MED ORDER — HEPARIN BOLUS VIA INFUSION
3000.0000 [IU] | Freq: Once | INTRAVENOUS | Status: AC
  Administered 2022-10-24: 3000 [IU] via INTRAVENOUS
  Filled 2022-10-24: qty 3000

## 2022-10-24 MED ORDER — CITALOPRAM HYDROBROMIDE 20 MG PO TABS: 20.0000 mg | ORAL_TABLET | Freq: Every day | ORAL | Status: DC

## 2022-10-24 NOTE — TOC Benefit Eligibility Note (Signed)
Patient Product/process development scientist completed.    The patient is currently admitted and upon discharge could be taking Eliquis 5 mg.  The current 30 day co-pay is $150.41.   The patient is insured through [employer]   This test claim was processed through [facility]- copay amounts may vary at other pharmacies due to pharmacy/plan contracts, or as the patient moves through the different stages of their insurance plan.  Roland Earl, CPHT Pharmacy Patient Advocate Specialist [facility] Patient Advocate Team Direct Number: [phone]  Fax: [phone]

## 2022-10-24 NOTE — Progress Notes (Signed)
Patient is alert to self. Patient has been turned throughout the night. Foley care provided. Wound care performed. Patient has been incontinent of stool times 2 this shift.

## 2022-10-24 NOTE — Progress Notes (Signed)
Progress Note   Patient: [name] QMV:[id number] DOB: 05-Sep-1945 DOA: 10/19/2022     5 DOS: the patient was seen and examined on 10/24/2022   Brief hospital admission narrative : Patient admitted from the skilled nursing facility secondary to general malaise and fever.  Recent admission approximately 2 weeks ago secondary to C. difficile infection.  Patient actively taking vancomycin tapering.  Still experiencing diarrhea.  Workup during this admission suggesting the presence of UTI.  Admitted for IV antibiotics and further evaluation/management.  Assessment and Plan: 1) left lower extremity DVT--- discussed findings with reading radiologist Dr. Karle Starch -No DVT on the right despite clinical findings acute DVT as noted is on the left -Start IV heparin for now -Given patient's anemia and bleeding concerns get IR consult for possible IVC filter placement  2)Klebsiella UTI (urinary tract infection) --Multiple antibiotic allergies -Ongoing C. difficile infection -ID pharmacist recommends stopping Cipro and using the linezolid for right leg cellulitis and Klebsiella UTI  3)Right leg cellulitis --with significant swelling and erythema -Please see photos in epic -Antibiotics as above #2  Dementia with behavioral disturbance (HCC) -Continue supportive care and considering tension -Mood currently stable -Continue the use of Celexa -Patient demonstrating poor insight of her condition. -Follow clinical response. -Palliative care consultation requested.  Pulmonary vascular congestion - Worsening vascular congestion - Cardiomegaly present - Pleural effusion present - Previous echo showed normal LV function - Peripheral edema present/chronic stasis dermatitis appreciated. - Follow echo results. -Follow daily weights/strict I's and O's -Patient reports no shortness of breath and there is no oxygen requirement at the moment.  C. difficile colitis -Continue p.o. vancomycin; in the setting of  new antibiotics requirement and is still ongoing diarrhea we will pursuit full dose of vancomycin therapy.  -Continue supportive care maintain adequate hydration.  Thyroid disease -Continue Synthroid  AKI (acute kidney injury) (HCC) - Most likely due to dehydration/GI losses - After fluid resuscitation and supportive care creatinine essentially back to baseline -Continue to minimize nephrotoxic agents as much as possible, avoid the use of contrast and prevent hypotension. -Continue to follow renal function trend.   -Continue the use of sodium bicarbonate (patient with mild metabolic acidosis component).  Decubitus ulcer -Wound VAC removed at SNF per report - Appreciate assistance and recommendation by wound care service -Continue constant repositioning, wound care and preventive barriers.  Anemia - Anemia of chronic disease as prior anemia panel evaluation -Fecal occult blood test was positive; most likely in the setting of acute C. difficile colitis. -Current hemoglobin 9.0 -Appreciate assistance and recommendation by gastroenterology service; for now no endoscopic evaluation acutely will be pursued. -Continue to follow hemoglobin trend.  Anxiety - Continue as needed Atarax -Patient also chronically on Celexa. -Continue supportive care and constant reorientation.  Depression - Stable mood; no active hallucination -Continue the use of Celexa and trazodone.   B12 deficiency anemia -B12 152 -will continue supplementation (intramuscular injection daily x 7 days; then weekly x 4; then monthly). -Repeat B12 level in 4-6 months.  -  Subjective:  - Right leg redness and swelling persist -Denies fevers or chills -Oral intake is fair -  Physical Exam: Vitals:   10/23/22 0309 10/23/22 1447 10/23/22 2052 10/24/22 0437  BP: 96/61 101/62 112/64 (!) 106/56  Pulse: 65 74 69 61  Resp: 18 18 18 18   Temp: 97.8 F (36.6 C) 98.6 F (37 C) 98.5 F (36.9 C) 98.2 F (36.8 C)   TempSrc: Oral Oral Oral   SpO2: 99% 96% 97% 96%  Weight:      Height:       Physical Exam  Gen:- Awake Alert, in no acute distress  HEENT:- [place].AT, No sclera icterus Neck-Supple Neck,No JVD,.  Lungs-  CTAB , fair air movement bilaterally  CV- S1, S2 normal, RRR Abd-  +ve B.Sounds, Abd Soft, No tenderness,    Extremity--- Rt leg swelling and erythema--- see photos below -good pedal pulses  Psych-affect is appropriate, oriented x3 Neuro-no new focal deficits, no tremors GU-Foley in situ with clear urine--apparently Foley was placed on 10/22/2022 to allow for wound healing Skin--sacral decub appears to be improving--see photo below  Media Information  Document Information  Photos  Sacral decub  10/23/2022 14:38  Attached To:  Hospital Encounter on 10/19/22  Source Information  Shon Hale, MD  Ap-Dept 300     Media Information  Document Information  Photos  Rt Leg redness and swelling  10/23/2022 14:37  Attached To:  Hospital Encounter on 10/19/22  Source Information  Shon Hale, MD  Ap-Dept 300   Family Communication: no family at bedside.  Disposition: SNF facility Status is: Inpatient Remains inpatient appropriate because: continue IV antibiotics.   Planned Discharge Destination: Skilled nursing facility  Author: Shon Hale, MD 10/24/2022 6:59 PM  For on call review [email or web address].

## 2022-10-24 NOTE — Progress Notes (Signed)
ANTICOAGULATION CONSULT NOTE - Initial Consult  Pharmacy Consult for heparin Indication:  LLE DVT  Allergies  Allergen Reactions   Celebrex [Celecoxib] Swelling   Keflet [Cephalexin] Swelling   Penicillins Hives and Swelling   Sulfa Antibiotics Swelling   Kenalog [Triamcinolone Acetonide]     unknown   Latex Other (See Comments)    No reaction listed on MAR   Lisinopril Other (See Comments)    unknown   Mobic [Meloxicam]     SWELLING   Penicillin G Other (See Comments)    No reaction listed on [facility]    Patient Measurements: Height: 5\' 2"  (157.5 cm) Weight: 70.5 kg (155 lb 6.8 oz) IBW/kg (Calculated) : 50.1 Heparin Dosing Weight: 70kg  Vital Signs: Temp: 98.2 F (36.8 C) (05/30 0437) BP: 106/56 (05/30 0437) Pulse Rate: 61 (05/30 0437)  Labs: Recent Labs    10/22/22 0408 10/24/22 0401  HGB 7.8* 8.5*  HCT 25.1* 26.9*  PLT 186 266  CREATININE 2.17* 2.17*    Estimated Creatinine Clearance: 20.3 mL/min (A) (by C-G formula based on SCr of 2.17 mg/dL (H)).   Medical History: Past Medical History:  Diagnosis Date   Allergy    Bradycardia    Bronchitis, chronic (HCC)    Chronic anxiety    Complication of anesthesia    hard to wake up   COPD (chronic obstructive pulmonary disease) (HCC)    Depression    Esophageal reflux    Headache(784.0)    Hyperlipidemia    Hypertension    Hypothyroidism    Obesity    Osteoporosis    Overactive bladder    PVC's (premature ventricular contractions)    Thyroid disease    Vitamin D deficiency disease     Assessment: 77 year old female admitted with general malaise and fever, recent cdiff infection on po vancomycin taper. Patient with new cellulitis and now dopplers positive for LLE dvt. Patient with ongoing anemia, hgb up to 8.5 this morning. New orders to start IV heparin, will need to watch cbc closely.   Goal of Therapy:  Heparin level 0.3-0.7 units/ml Monitor platelets by anticoagulation protocol: Yes   Plan:   Give 3000 units bolus x 1 Start heparin infusion at 1000 units/hr Check anti-Xa level in 8 hours and daily while on heparin Continue to monitor H&H and platelets  Sheppard Coil PharmD., BCPS Clinical Pharmacist 10/24/2022 9:28 AM

## 2022-10-24 NOTE — Progress Notes (Signed)
ANTICOAGULATION CONSULT NOTE - Follow Up Consult  Pharmacy Consult for heparin Indication:  LLE DVT  Allergies  Allergen Reactions   Celebrex [Celecoxib] Swelling   Keflet [Cephalexin] Swelling   Penicillins Hives and Swelling   Sulfa Antibiotics Swelling   Kenalog [Triamcinolone Acetonide]     unknown   Latex Other (See Comments)    No reaction listed on MAR   Lisinopril Other (See Comments)    unknown   Mobic [Meloxicam]     SWELLING   Penicillin G Other (See Comments)    No reaction listed on [facility]    Patient Measurements: Height: 5\' 2"  (157.5 cm) Weight: 70.5 kg (155 lb 6.8 oz) IBW/kg (Calculated) : 50.1 Heparin Dosing Weight: 70kg  Vital Signs:    Labs: Recent Labs    10/22/22 0408 10/24/22 0401 10/24/22 1810  HGB 7.8* 8.5*  --   HCT 25.1* 26.9*  --   PLT 186 266  --   HEPARINUNFRC  --   --  <0.10*  CREATININE 2.17* 2.17*  --      Estimated Creatinine Clearance: 20.3 mL/min (A) (by C-G formula based on SCr of 2.17 mg/dL (H)).   Medical History: Past Medical History:  Diagnosis Date   Allergy    Bradycardia    Bronchitis, chronic (HCC)    Chronic anxiety    Complication of anesthesia    hard to wake up   COPD (chronic obstructive pulmonary disease) (HCC)    Depression    Esophageal reflux    Headache(784.0)    Hyperlipidemia    Hypertension    Hypothyroidism    Obesity    Osteoporosis    Overactive bladder    PVC's (premature ventricular contractions)    Thyroid disease    Vitamin D deficiency disease     Assessment: 77 year old female admitted with general malaise and fever, recent cdiff infection on po vancomycin taper. Patient with new cellulitis and now dopplers positive for LLE dvt. Patient with ongoing anemia, hgb up to 8.5 this morning. New orders to start IV heparin, will need to watch cbc closely.   PM Update: first heparin level is undetectable. No pauses or interruption to infusion or bleeding issues reported.  Goal of  Therapy:  Heparin level 0.3-0.7 units/ml Monitor platelets by anticoagulation protocol: Yes   Plan:  Repeat heparin 3000 units IV bolus Increase heparin infusion 1200 units/hr Check heparin level in ~8hrs and daily Check CBC daily, monitor closely for signs/symptoms of bleeding  Loralee Pacas, PharmD, BCPS 10/24/2022 7:22 PM

## 2022-10-24 NOTE — TOC Progression Note (Signed)
Transition of Care [facility]) - Progression Note    Patient Details  Name: [name] MRN: [id number] Date of Birth: 1945-06-23  [facility] CM/SW Contact  Elliot Gault, LCSW Phone Number: 10/24/2022, 9:58 AM  Clinical Narrative:     TOC following. Per MD, pt with newly discovered DVT in L leg and not stable for dc yet. Updated [address] [place].  TOC will follow.  Expected Discharge Plan: Skilled Nursing Facility Barriers to Discharge: Continued Medical Work up  Expected Discharge Plan and Services In-house Referral: Clinical Social Work   Post Acute Care Choice: Resumption of Svcs/PTA Provider Living arrangements for the past 2 months: Skilled Nursing Facility                                       Social Determinants of Health (SDOH) Interventions SDOH Screenings   Food Insecurity: No Food Insecurity (10/19/2022)  Housing: Low Risk  (10/19/2022)  Transportation Needs: No Transportation Needs (10/19/2022)  Utilities: Not At Risk (10/19/2022)  Alcohol Screen: Low Risk  (11/28/2021)  Depression (PHQ2-9): Low Risk  (05/06/2022)  Financial Resource Strain: Low Risk  (11/28/2021)  Physical Activity: Inactive (11/28/2021)  Social Connections: Socially Integrated (11/28/2021)  Stress: Stress Concern Present (11/28/2021)  Tobacco Use: Low Risk  (10/21/2022)    Readmission Risk Interventions    10/21/2022   10:12 AM 08/30/2022    3:36 PM  Readmission Risk Prevention Plan  Transportation Screening Complete Complete  PCP or Specialist Appt within 3-5 Days  Complete  Social Work Consult for Recovery Care Planning/Counseling  Complete  Palliative Care Screening  Not Applicable  Medication Review Oceanographer) Complete Complete  HRI or Home Care Consult Complete   SW Recovery Care/Counseling Consult Complete   Palliative Care Screening Not Applicable   Skilled Nursing Facility Complete

## 2022-10-25 ENCOUNTER — Ambulatory Visit (HOSPITAL_COMMUNITY)
Admit: 2022-10-25 | Discharge: 2022-10-25 | Disposition: A | Discharge status: Home / Self Care | Payer: Medicare Other | Attending: Family Medicine | Admitting: Family Medicine

## 2022-10-25 DIAGNOSIS — N179 Acute kidney failure, unspecified: Secondary | ICD-10-CM | POA: Diagnosis not present

## 2022-10-25 DIAGNOSIS — I82409 Acute embolism and thrombosis of unspecified deep veins of unspecified lower extremity: Secondary | ICD-10-CM | POA: Diagnosis not present

## 2022-10-25 DIAGNOSIS — I82402 Acute embolism and thrombosis of unspecified deep veins of left lower extremity: Secondary | ICD-10-CM | POA: Diagnosis not present

## 2022-10-25 DIAGNOSIS — D638 Anemia in other chronic diseases classified elsewhere: Secondary | ICD-10-CM | POA: Diagnosis not present

## 2022-10-25 DIAGNOSIS — A0472 Enterocolitis due to Clostridium difficile, not specified as recurrent: Secondary | ICD-10-CM | POA: Diagnosis not present

## 2022-10-25 HISTORY — PX: IR IVC FILTER PLMT / S&I /IMG GUID/MOD SED: IMG701

## 2022-10-25 LAB — CBC
HCT: 27.4 % — ABNORMAL LOW (ref 36.0–46.0)
Hemoglobin: 8.8 g/dL — ABNORMAL LOW (ref 12.0–15.0)
MCH: 29.8 pg (ref 26.0–34.0)
MCHC: 32.1 g/dL (ref 30.0–36.0)
MCV: 92.9 fL (ref 80.0–100.0)
Platelets: 370 10*3/uL (ref 150–400)
RBC: 2.95 MIL/uL — ABNORMAL LOW (ref 3.87–5.11)
RDW: 15 % (ref 11.5–15.5)
WBC: 12.7 10*3/uL — ABNORMAL HIGH (ref 4.0–10.5)
nRBC: 0 % (ref 0.0–0.2)

## 2022-10-25 LAB — GLUCOSE, CAPILLARY
Glucose-Capillary: 75 mg/dL (ref 70–99)
Glucose-Capillary: 137 mg/dL — ABNORMAL HIGH (ref 70–99)

## 2022-10-25 LAB — HEPARIN LEVEL (UNFRACTIONATED): Heparin Unfractionated: 0.1 IU/mL — ABNORMAL LOW (ref 0.30–0.70)

## 2022-10-25 MED ORDER — VANCOMYCIN HCL 125 MG PO CAPS
125.0000 mg | ORAL_CAPSULE | Freq: Two times a day (BID) | ORAL | Status: DC
  Administered 2022-10-25 – 2022-10-28 (×7): 125 mg via ORAL
  Filled 2022-10-25 (×11): qty 1

## 2022-10-25 MED ORDER — HEPARIN BOLUS VIA INFUSION
2000.0000 [IU] | Freq: Once | INTRAVENOUS | Status: AC
  Administered 2022-10-25: 2000 [IU] via INTRAVENOUS
  Filled 2022-10-25: qty 2000

## 2022-10-25 MED ORDER — ALPRAZOLAM 1 MG PO TABS
1.0000 mg | ORAL_TABLET | Freq: Once | ORAL | Status: AC
  Administered 2022-10-25: 1 mg via ORAL
  Filled 2022-10-25: qty 1

## 2022-10-25 MED ORDER — VANCOMYCIN HCL 125 MG PO CAPS: 125.0000 mg | ORAL_CAPSULE | Freq: Every day | ORAL | Status: DC

## 2022-10-25 MED ORDER — LIDOCAINE HCL 1 % IJ SOLN
INTRAMUSCULAR | Status: AC
  Filled 2022-10-25: qty 20

## 2022-10-25 MED ORDER — FENTANYL CITRATE (PF) 100 MCG/2ML IJ SOLN
INTRAMUSCULAR | Status: AC
  Filled 2022-10-25: qty 2

## 2022-10-25 MED ORDER — VANCOMYCIN HCL 125 MG PO CAPS: 125.0000 mg | ORAL_CAPSULE | ORAL | Status: DC

## 2022-10-25 MED ORDER — FENTANYL CITRATE (PF) 100 MCG/2ML IJ SOLN
INTRAMUSCULAR | Status: AC | PRN
  Administered 2022-10-25: 25 ug via INTRAVENOUS

## 2022-10-25 MED ORDER — HEPARIN (PORCINE) 25000 UT/250ML-% IV SOLN
1600.0000 [IU]/h | INTRAVENOUS | Status: DC
  Administered 2022-10-25: 1350 [IU]/h via INTRAVENOUS
  Administered 2022-10-26: 1450 [IU]/h via INTRAVENOUS
  Administered 2022-10-27 (×2): 1600 [IU]/h via INTRAVENOUS
  Filled 2022-10-25 (×3): qty 250

## 2022-10-25 MED ORDER — DEXTROSE-SODIUM CHLORIDE 5-0.9 % IV SOLN: INTRAVENOUS | Status: AC

## 2022-10-25 NOTE — Progress Notes (Signed)
Progress Note   Patient: [name] ZOX:[id number] DOB: [birth year] DOA: 10/19/2022     6 DOS: the patient was seen and examined on 10/25/2022   Brief hospital admission narrative : Patient admitted from the skilled nursing facility secondary to general malaise and fever.  Recent admission approximately 2 weeks ago secondary to C. difficile infection.  Patient actively taking vancomycin tapering.  Still experiencing diarrhea.  Workup during this admission suggesting the presence of UTI.  Admitted for IV antibiotics and further evaluation/management.  Assessment and Plan: 1)Left Lower Extremity DVT--- discussed findings with reading radiologist Dr. Karle Starch -No DVT on the right despite clinical findings acute DVT as noted is on the left -c/n  IV heparin (started 10/24/22)---heparin briefly on hold to allow for IVC filter placement on 10/25/2022 -Given patient's anemia and bleeding concerns get IR consult for possible IVC filter placement -Discussed with Dr. Miles Costain (IR) and patient's brother [name] (his wife [name]) and pt's son [name]--- plan is for IVC filter placement at [facility] on 10/25/2022  2)Klebsiella UTI (urinary tract infection) --Multiple antibiotic allergies -Ongoing C. difficile infection -ID pharmacist recommends stopping Cipro and using the linezolid for right leg cellulitis and Klebsiella UTI  3)Right leg cellulitis --with significant swelling and erythema -Please see photos in epic -Antibiotics as above #2  Dementia with behavioral disturbance (HCC) -Continue supportive care and considering tension -Mood currently stable -Continue the use of Celexa -Patient demonstrating poor insight of her condition. -Follow clinical response. -Palliative care consultation requested.  Pulmonary vascular congestion - Worsening vascular congestion - Cardiomegaly present - Pleural effusion present - Previous echo showed normal LV function - Peripheral edema present/chronic  stasis dermatitis appreciated. - Follow echo results. -Follow daily weights/strict I's and O's -Patient reports no shortness of breath and there is no oxygen requirement at the moment.  C. difficile colitis -Continue p.o. vancomycin; in the setting of new antibiotics requirement and is still ongoing diarrhea we will pursuit full dose of vancomycin therapy.  -Continue supportive care maintain adequate hydration.  Thyroid disease -Continue Synthroid  AKI (acute kidney injury) (HCC) - Most likely due to dehydration/GI losses - After fluid resuscitation and supportive care creatinine essentially back to baseline -Continue to minimize nephrotoxic agents as much as possible, avoid the use of contrast and prevent hypotension. -Continue to follow renal function trend.   -Continue the use of sodium bicarbonate (patient with mild metabolic acidosis component). 10/25/22 -Creatinine staying above 2, give gentle hydration  Decubitus ulcer----actually appears to be improving -Wound VAC removed at SNF per report - Appreciate assistance and recommendation by wound care service -Continue constant repositioning, wound care and preventive barriers.  Anemia - Anemia of chronic disease as prior anemia panel evaluation -Fecal occult blood test was positive; most likely in the setting of acute C. difficile colitis. -Current hemoglobin > 8 -Appreciate assistance and recommendation by gastroenterology service; for now no endoscopic evaluation acutely will be pursued. -Continue to follow hemoglobin trend.  Anxiety - Continue as needed Atarax -Patient also chronically on Celexa. -Continue supportive care and constant reorientation.  Depression - Stable mood; no active hallucination -Continue the use of Celexa and trazodone.   B12 deficiency anemia -B12 152 - continue supplementation (intramuscular injection daily x 7 days; then weekly x 4; then monthly). -Repeat B12 level in 4-6  months.  - Subjective:  -Right leg redness and swelling improving, denies significant pain -Denies fevers or chills -On the phone speaking with her brother [name] and sister-in-law --Discussed with Dr. Miles Costain (IR)  and patient's brother [name] (his wife [name]) and pt's son [name]--- plan is for IVC filter placement at [facility] on 10/25/2022  Physical Exam: Vitals:   10/23/22 2052 10/24/22 0437 10/24/22 2052 10/25/22 0507  BP: 112/64 (!) 106/56 (!) 107/53 (!) 111/59  Pulse: 69 61 67 60  Resp: 18 18 18 18   Temp: 98.5 F (36.9 C) 98.2 F (36.8 C) 98.3 F (36.8 C) 98.2 F (36.8 C)  TempSrc: Oral     SpO2: 97% 96% 99% 98%  Weight:      Height:       Physical Exam  Gen:- Awake Alert, in no acute distress  HEENT:- [place].AT, No sclera icterus Neck-Supple Neck,No JVD,.  Lungs-  CTAB , fair air movement bilaterally  CV- S1, S2 normal, RRR Abd-  +ve B.Sounds, Abd Soft, No tenderness,    Extremity--- Rt leg swelling and erythema is actually improving --- see photos below -good pedal pulses  -Despite Doppler finding of extensive deep to the left lower extremity,  left LE exam is largely unremarkable (no left leg or thigh swelling, no tenderness, no warmth, Psych-affect is appropriate, oriented x3, forgetful at times Neuro-no new focal deficits, no tremors GU-Foley in situ with clear urine--apparently Foley was placed on 10/22/2022 to allow for wound healing Skin--sacral decub appears to be improving--see photo below  Media Information  Document Information  Photos  Sacral decub  10/23/2022 14:38  Attached To:  Hospital Encounter on 10/19/22  Source Information  Shon Hale, MD  [facility]     Media Information  Document Information  Photos  Rt Leg redness and swelling  10/23/2022 14:37  Attached To:  Hospital Encounter on 10/19/22  Source Information  Shon Hale, MD  [facility]   Family Communication: no family at bedside.  Disposition:  SNF facility Status is: Inpatient Remains inpatient appropriate because: continue IV antibiotics.   Planned Discharge Destination: Skilled nursing facility  Author: Shon Hale, MD 10/25/2022 10:25 AM  For on call review [email or web address].

## 2022-10-25 NOTE — Progress Notes (Signed)
ANTICOAGULATION CONSULT NOTE - Follow Up Consult  Pharmacy Consult for heparin Indication:  LLE DVT  Allergies  Allergen Reactions   Celebrex [Celecoxib] Swelling   Keflet [Cephalexin] Swelling   Penicillins Hives and Swelling   Sulfa Antibiotics Swelling   Kenalog [Triamcinolone Acetonide]     unknown   Latex Other (See Comments)    No reaction listed on MAR   Lisinopril Other (See Comments)    unknown   Mobic [Meloxicam]     SWELLING   Penicillin G Other (See Comments)    No reaction listed on [facility]    Patient Measurements: Height: 5\' 2"  (157.5 cm) Weight: 70.5 kg (155 lb 6.8 oz) IBW/kg (Calculated) : 50.1 Heparin Dosing Weight: 70kg  Vital Signs: Temp: 98.2 F (36.8 C) (05/31 0507) BP: 111/59 (05/31 0507) Pulse Rate: 60 (05/31 0507)  Labs: Recent Labs    10/24/22 0401 10/24/22 1810 10/25/22 0413  HGB 8.5*  --  8.8*  HCT 26.9*  --  27.4*  PLT 266  --  370  HEPARINUNFRC  --  <0.10* 0.10*  CREATININE 2.17*  --   --      Estimated Creatinine Clearance: 20.3 mL/min (A) (by C-G formula based on SCr of 2.17 mg/dL (H)).   Medical History: Past Medical History:  Diagnosis Date   Allergy    Bradycardia    Bronchitis, chronic (HCC)    Chronic anxiety    Complication of anesthesia    hard to wake up   COPD (chronic obstructive pulmonary disease) (HCC)    Depression    Esophageal reflux    Headache(784.0)    Hyperlipidemia    Hypertension    Hypothyroidism    Obesity    Osteoporosis    Overactive bladder    PVC's (premature ventricular contractions)    Thyroid disease    Vitamin D deficiency disease     Assessment: 77 year old female admitted with general malaise and fever, recent cdiff infection on po vancomycin taper. Patient with new cellulitis and now dopplers positive for LLE dvt. Patient with ongoing anemia, hgb up to 8.5 this morning. New orders to start IV heparin, will need to watch cbc closely.   PM Update: first heparin level is  undetectable. No pauses or interruption to infusion or bleeding issues reported.  5/31 AM update:  Heparin level sub-therapeutic   Goal of Therapy:  Heparin level 0.3-0.7 units/ml Monitor platelets by anticoagulation protocol: Yes   Plan:  Repeat heparin 2000 units IV bolus Increase heparin infusion 1350 units/hr Check heparin level in ~8hrs and daily Check CBC daily, monitor closely for signs/symptoms of bleeding  Abran Duke, PharmD, BCPS Clinical Pharmacist Phone: [phone]

## 2022-10-25 NOTE — Progress Notes (Signed)
ANTICOAGULATION CONSULT NOTE - Follow Up Consult  Pharmacy Consult for heparin Indication:  LLE DVT  Allergies  Allergen Reactions   Celebrex [Celecoxib] Swelling   Keflet [Cephalexin] Swelling   Penicillins Hives and Swelling   Sulfa Antibiotics Swelling   Kenalog [Triamcinolone Acetonide]     unknown   Latex Other (See Comments)    No reaction listed on MAR   Lisinopril Other (See Comments)    unknown   Mobic [Meloxicam]     SWELLING   Penicillin G Other (See Comments)    No reaction listed on [facility]    Patient Measurements: Height: 5\' 2"  (157.5 cm) Weight: 70.5 kg (155 lb 6.8 oz) IBW/kg (Calculated) : 50.1 Heparin Dosing Weight: 70kg  Vital Signs: Temp: 97.8 F (36.6 C) (05/31 1630) Temp Source: Oral (05/31 1630) BP: 103/65 (05/31 1630) Pulse Rate: 63 (05/31 1630)  Labs: Recent Labs    10/24/22 0401 10/24/22 1810 10/25/22 0413  HGB 8.5*  --  8.8*  HCT 26.9*  --  27.4*  PLT 266  --  370  HEPARINUNFRC  --  <0.10* 0.10*  CREATININE 2.17*  --   --      Estimated Creatinine Clearance: 20.3 mL/min (A) (by C-G formula based on SCr of 2.17 mg/dL (H)).   Medical History: Past Medical History:  Diagnosis Date   Allergy    Bradycardia    Bronchitis, chronic (HCC)    Chronic anxiety    Complication of anesthesia    hard to wake up   COPD (chronic obstructive pulmonary disease) (HCC)    Depression    Esophageal reflux    Headache(784.0)    Hyperlipidemia    Hypertension    Hypothyroidism    Obesity    Osteoporosis    Overactive bladder    PVC's (premature ventricular contractions)    Thyroid disease    Vitamin D deficiency disease     Assessment: 77 year old female admitted with general malaise and fever, recent cdiff infection on po vancomycin taper. Patient with new cellulitis and now dopplers positive for LLE dvt. Patient with ongoing anemia, hgb up to 8.5 this morning. New orders to start IV heparin, will need to watch cbc closely.   5/31 PM:  restarting heparin infusion about 4 hours after IVC filter procedure w/out bolus. Previous heparin level subtherapeutic.   Goal of Therapy:  Heparin level 0.3-0.7 units/ml Monitor platelets by anticoagulation protocol: Yes   Plan:  Restart heparin infusion at 1350 units/hr Check heparin level in ~8hrs and daily Check CBC daily, monitor closely for signs/symptoms of bleeding  Judeth Cornfield, PharmD Clinical Pharmacist 10/25/2022 5:15 PM

## 2022-10-25 NOTE — Progress Notes (Signed)
[facility] here for pt transport to [facility] for procedure. IR notified that pt is on her way.

## 2022-10-25 NOTE — Progress Notes (Signed)
Pt returned to room 301 via stretcher by [facility] staff s/p IVS filter placement. DDI to right neck, pt A&O x4 at this time. VSS.

## 2022-10-25 NOTE — Consult Note (Signed)
Chief Complaint: DVT, anemia. Request is for IVC filter placement  Referring Physician(s): Dr. Bea Laura Courage  Supervising Physician: Ruel Favors  Patient Status: [facility] - Inpatient  History of Present Illness: [name] is a 77 y.o. female inpatient ([facility]). History of HTN, hypothyroidism, HLD, GERD. Recenlty diagnosed with c diff and a stage 4 sacral decubitus ulcers with wound vac. Presented to the ED at [facility] on 5.25.24 with fever and generalized weakness.  Admitted due to concern for UTI. Found to have a left lower extremity DVT and anemia. Team is requesting an IVC filter placement.   Past Medical History:  Diagnosis Date   Allergy    Bradycardia    Bronchitis, chronic (HCC)    Chronic anxiety    Complication of anesthesia    hard to wake up   COPD (chronic obstructive pulmonary disease) (HCC)    Depression    Esophageal reflux    Headache(784.0)    Hyperlipidemia    Hypertension    Hypothyroidism    Obesity    Osteoporosis    Overactive bladder    PVC's (premature ventricular contractions)    Thyroid disease    Vitamin D deficiency disease     Past Surgical History:  Procedure Laterality Date   ABDOMINAL HYSTERECTOMY     CENTRAL LINE INSERTION Right 01/10/2022   Procedure: CENTRAL LINE INSERTION;  Surgeon: Lucretia Roers, MD;  Location: [facility] ORS;  Service: General;  Laterality: Right;   CHOLECYSTECTOMY N/A 11/04/2013   Procedure: LAPAROSCOPIC CHOLECYSTECTOMY WITH INTRAOPERATIVE CHOLANGIOGRAM;  Surgeon: Wilmon Arms. Corliss Skains, MD;  Location: [facility] OR;  Service: General;  Laterality: N/A;   COLONOSCOPY     FRACTURE SURGERY     pins removed from Hip surgery   HIP FRACTURE SURGERY  1990    pins removed in 1991   IR FLUORO GUIDE CV LINE LEFT  01/31/2022   IR US GUIDE VASC ACCESS LEFT  01/31/2022   LAPAROTOMY N/A 01/10/2022   Procedure: EXPLORATORY LAPAROTOMY,;  Surgeon: Lucretia Roers, MD;  Location: [facility] ORS;  Service: General;  Laterality: N/A;   LYSIS OF ADHESION N/A  01/10/2022   Procedure: LYSIS OF ADHESION;  Surgeon: Lucretia Roers, MD;  Location: [facility];  Service: General;  Laterality: N/A;   PARTIAL HYSTERECTOMY  1987   SPINAL FUSION     THORACENTESIS N/A 01/18/2022   Procedure: THORACENTESIS;  Surgeon: Lorin Glass, MD;  Location: [facility] ENDOSCOPY;  Service: Pulmonary;  Laterality: N/A;   UPPER GASTROINTESTINAL ENDOSCOPY      Allergies: Celebrex [celecoxib], Keflet [cephalexin], Penicillins, Sulfa antibiotics, Kenalog [triamcinolone acetonide], Latex, Lisinopril, Mobic [meloxicam], and Penicillin g  Medications: Prior to Admission medications   Medication Sig Start Date End Date Taking? Authorizing Provider  acetaminophen (TYLENOL) 325 MG tablet Take 2 tablets (650 mg total) by mouth every 6 (six) hours as needed for mild pain (or Fever >/= 101). 10/01/22  Yes Emokpae, Courage, MD  Amino Acids-Protein Hydrolys (PRO-STAT) LIQD Take 30 mLs by mouth 2 (two) times daily.   Yes [provider]  ascorbic acid (VITAMIN C) 500 MG tablet Take 500 mg by mouth 2 (two) times daily.   Yes [provider]  Cholecalciferol (VITAMIN D3) 50 MCG (2000 UT) CAPS Take 2,000 Units by mouth every morning.   Yes [provider]  citalopram (CELEXA) 40 MG tablet Take 1 tablet (40 mg total) by mouth daily. 05/06/22  Yes Daphine Deutscher, Mary-Margaret, FNP  feeding supplement, ENSURE COMPLETE, (ENSURE COMPLETE) LIQD Take 237 mLs  by mouth 2 (two) times daily between meals. 10/01/22  Yes Shon Hale, MD  ferrous sulfate 325 (65 FE) MG tablet Take 1 tablet (325 mg total) by mouth daily with breakfast. 10/01/22  Yes Emokpae, Courage, MD  glucose 4 GM chewable tablet Chew 1 tablet (4 g total) by mouth as needed for low blood sugar. 10/01/22  Yes Emokpae, Courage, MD  levothyroxine (SYNTHROID) 75 MCG tablet Take 1 tablet (75 mcg total) by mouth daily. 05/07/22  Yes Martin, Mary-Margaret, FNP  Magnesium Oxide 400 MG CAPS Take 1 capsule (400 mg total) by mouth daily.  09/04/22  Yes Burnadette Pop, MD  megestrol (MEGACE) 400 MG/10ML suspension Take 10 mLs (400 mg total) by mouth 2 (two) times daily. For appetite stimulation 10/01/22  Yes Emokpae, Courage, MD  Multiple Vitamin (MULTIVITAMIN) tablet Take 1 tablet by mouth every morning.   Yes [provider]  nutrition supplement, JUVEN, (JUVEN) PACK Take 1 packet by mouth 2 (two) times daily between meals.   Yes [provider]  nystatin (MYCOSTATIN/NYSTOP) powder Apply topically 3 (three) times daily. On groins 09/04/22  Yes Adhikari, Amrit, MD  OxyCODONE HCl, Abuse Deter, (OXAYDO) 5 MG TABA Take 5 mg by mouth daily as needed (severe pain). 10/01/22  Yes Emokpae, Courage, MD  rOPINIRole (REQUIP XL) 2 MG 24 hr tablet Take 2 mg by mouth at bedtime.   Yes [provider]  sodium bicarbonate 650 MG tablet Take 1 tablet (650 mg total) by mouth 3 (three) times daily. 10/01/22 10/01/23 Yes Emokpae, Courage, MD  traMADol (ULTRAM) 50 MG tablet Take 1 tablet (50 mg total) by mouth every 12 (twelve) hours as needed for moderate pain. 10/01/22 10/01/23 Yes Emokpae, Courage, MD  traZODone (DESYREL) 50 MG tablet Take 1 tablet (50 mg total) by mouth at bedtime. 10/01/22  Yes Emokpae, Courage, MD  vancomycin (VANCOCIN) 125 MG capsule Take 1 capsule (125 mg total) by mouth See admin instructions. Take Vancomycin  125 mg--1 capsule 4 times a day for 14 days, followed by 1 capsule twice daily for 7 days, then 1 capsule once daily for 7 days, then 1 capsule every other day for 7 days, then 1 capsule every 3 days for 7 days then Stop Patient taking differently: Take 125 mg by mouth in the morning and at bedtime. for 7 days then Stop for c-diff 10/01/22  Yes Emokpae, Courage, MD  Zinc 220 (50 Zn) MG CAPS Take 220 mg by mouth every morning. 10/01/22  Yes Shon Hale, MD     Family History  Problem Relation Age of Onset   Arthritis Other    Heart disease Other    Cancer Other    Diabetes Other    OCD Other     Social  History   Socioeconomic History   Marital status: Single    Spouse name: Not on file   Number of children: 2   Years of education: Not on file   Highest education level: Not on file  Occupational History   Not on file  Tobacco Use   Smoking status: Never   Smokeless tobacco: Never  Vaping Use   Vaping Use: Never used  Substance and Sexual Activity   Alcohol use: No   Drug use: No   Sexual activity: Not on file  Other Topics Concern   Not on file  Social History Narrative   Not on file   Social Determinants of Health   Financial Resource Strain: Low Risk  (11/28/2021)  Overall Financial Resource Strain (CARDIA)    Difficulty of Paying Living Expenses: Not hard at all  Food Insecurity: No Food Insecurity (10/19/2022)   Hunger Vital Sign    Worried About Running Out of Food in the Last Year: Never true    Ran Out of Food in the Last Year: Never true  Transportation Needs: No Transportation Needs (10/19/2022)   PRAPARE - Administrator, Civil Service (Medical): No    Lack of Transportation (Non-Medical): No  Physical Activity: Inactive (11/28/2021)   Exercise Vital Sign    Days of Exercise per Week: 0 days    Minutes of Exercise per Session: 0 min  Stress: Stress Concern Present (11/28/2021)   [employer] of Occupational Health - Occupational Stress Questionnaire    Feeling of Stress : To some extent  Social Connections: Socially Integrated (11/28/2021)   Social Connection and Isolation Panel [NHANES]    Frequency of Communication with Friends and Family: More than three times a week    Frequency of Social Gatherings with Friends and Family: More than three times a week    Attends Religious Services: More than 4 times per year    Active Member of [employer] or Organizations: Yes    Attends Engineer, structural: More than 4 times per year    Marital Status: Married     Review of Systems: A 12 point ROS discussed and pertinent positives are indicated in  the HPI above.  All other systems are negative.  Review of Systems  Vital Signs: BP (!) 111/59   Pulse 60   Temp 98.2 F (36.8 C)   Resp 18   Ht 5\' 2"  (1.575 m)   Wt 155 lb 6.8 oz (70.5 kg)   SpO2 98%   BMI 28.43 kg/m   Advance Care Plan: The advanced care plan/surrogate decision maker was discussed at the time of visit and the patient did not wish to discuss or was not able to name a surrogate decision maker or provide an advance care plan.    Physical Exam Vitals reviewed.  Constitutional:      Appearance: Normal appearance.  Cardiovascular:     Rate and Rhythm: Normal rate and regular rhythm.  Pulmonary:     Effort: Pulmonary effort is normal. No respiratory distress.     Breath sounds: Normal breath sounds.  Neurological:     General: No focal deficit present.     Mental Status: She is alert and oriented to person, place, and time.  Psychiatric:        Mood and Affect: Mood normal.        Behavior: Behavior normal.        Thought Content: Thought content normal.        Judgment: Judgment normal.     Labs:  CBC: Recent Labs    10/20/22 0417 10/22/22 0408 10/24/22 0401 10/25/22 0413  WBC 10.2 10.4 12.4* 12.7*  HGB 9.0* 7.8* 8.5* 8.8*  HCT 28.7* 25.1* 26.9* 27.4*  PLT 230 186 266 370    COAGS: Recent Labs    01/13/22 0448 01/14/22 0400 07/06/22 1850 07/07/22 0359  INR  --   --  1.2 1.3*  APTT 50* 197* 40* 42*    BMP: Recent Labs    10/19/22 1553 10/20/22 0417 10/22/22 0408 10/24/22 0401  NA 132* 133* 130* 131*  K 4.2 4.3 4.0 4.2  CL 103 104 102 100  CO2 19* 17* 17* 23  GLUCOSE 102* 73  82 83  BUN 59* 55* 56* 55*  CALCIUM 7.8* 7.7* 7.4* 7.8*  CREATININE 2.20* 2.09* 2.17* 2.17*  GFRNONAA 23* 24* 23* 23*    LIVER FUNCTION TESTS: Recent Labs    09/24/22 0017 09/26/22 1033 09/27/22 0406 09/30/22 0410 10/01/22 0447 10/19/22 1553 10/20/22 0417  BILITOT 0.7 0.6  --   --   --  0.9 0.9  AST 23 21  --   --   --  39 37  ALT 14 14  --    --   --  38 37  ALKPHOS 175* 138*  --   --   --  180* 188*  PROT 6.3* 5.3*  --   --   --  5.5* 5.5*  ALBUMIN 2.6* 2.3*   < > 2.0* 2.2* 2.1* 2.0*   < > = values in this interval not displayed.    Assessment and Plan:  77 y.o female inpatient ([facility]). History of HTN, hypothyroidism, HLD, GERD. Recenlty diagnosed with c diff and a stage 4 sacral decubitus ulcers with wound vac. Presented to the ED at [facility] on 5.25.24 with fever and generalized weakness.  Admitted due to concern for UTI. Found to have a left lower extremity DVT and anemia. Team is requesting an IVC filter placement.   Venous doppler from 5.29.24 reads Deep venous thrombosis on the left involving the calf veins, popliteal vein and peripheral aspect of the superficial femoral vein. Hgb 8.8 Chest X-ray from 5.25.24 shows no lines or drains. Patient is on heparin gtt.   Risks and benefits discussed with the patient's son  including, but not limited to bleeding, infection, contrast induced renal failure, filter fracture or migration which can lead to emergency surgery or even death, strut penetration with damage or irritation to adjacent structures and caval thrombosis. All of the patient's son's questions were answered, patient is agreeable to proceed. Consent signed and in IR RN box   Thank you for this interesting consult.  I greatly enjoyed meeting Charleta Neiheisel and look forward to participating in their care.  A copy of this report was sent to the requesting provider on this date.  Electronically Signed:    Gwynneth Macleod PA-C 10/25/2022 12:27 PM     I spent a total of 20 Minutes    in face to face in clinical consultation, greater than 50% of which was counseling/coordinating care for IVC filter placement.

## 2022-10-25 NOTE — Progress Notes (Signed)
EOS summary: Pt had IVC filter placed at [facility] today, tolerated procedure well. Pt alert this shift, responding appropriately to questions and situation. Tolerated supper meal and nutritional supplement without difficulty swallowing. Has required no pain medication today. Prevalon boots remain on pt's feet bilaterally. DDI to sacrum.

## 2022-10-25 NOTE — Procedures (Signed)
Interventional Radiology Procedure Note  Procedure: IVC FILTER    Complications: None  Estimated Blood Loss:  MIN  Findings: BARD DENALI FILTER PLACED FULL REPORT IN PACS     Sharen Counter, MD

## 2022-10-25 NOTE — Care Management Important Message (Signed)
Important Message  Patient Details  Name: [name] MRN: [id number] Date of Birth: [birth year]   Medicare Important Message Given:  Yes (spoke with son [name]  to review letter, no additional copy needed)     Corey Harold 10/25/2022, 10:25 AM

## 2022-10-26 DIAGNOSIS — N179 Acute kidney failure, unspecified: Secondary | ICD-10-CM | POA: Diagnosis not present

## 2022-10-26 DIAGNOSIS — E079 Disorder of thyroid, unspecified: Secondary | ICD-10-CM | POA: Diagnosis not present

## 2022-10-26 DIAGNOSIS — I82402 Acute embolism and thrombosis of unspecified deep veins of left lower extremity: Secondary | ICD-10-CM | POA: Diagnosis not present

## 2022-10-26 DIAGNOSIS — F03918 Unspecified dementia, unspecified severity, with other behavioral disturbance: Secondary | ICD-10-CM | POA: Diagnosis not present

## 2022-10-26 LAB — RENAL FUNCTION PANEL
Albumin: 1.6 g/dL — ABNORMAL LOW (ref 3.5–5.0)
Anion gap: 7 (ref 5–15)
BUN: 53 mg/dL — ABNORMAL HIGH (ref 8–23)
CO2: 22 mmol/L (ref 22–32)
Calcium: 7.5 mg/dL — ABNORMAL LOW (ref 8.9–10.3)
Chloride: 103 mmol/L (ref 98–111)
Creatinine, Ser: 1.96 mg/dL — ABNORMAL HIGH (ref 0.44–1.00)
GFR, Estimated: 26 mL/min — ABNORMAL LOW (ref >60)
Glucose, Bld: 145 mg/dL — ABNORMAL HIGH (ref 70–99)
Phosphorus: 3.9 mg/dL (ref 2.5–4.6)
Potassium: 4.5 mmol/L (ref 3.5–5.1)
Sodium: 132 mmol/L — ABNORMAL LOW (ref 135–145)

## 2022-10-26 LAB — GLUCOSE, CAPILLARY
Glucose-Capillary: 103 mg/dL — ABNORMAL HIGH (ref 70–99)
Glucose-Capillary: 104 mg/dL — ABNORMAL HIGH (ref 70–99)
Glucose-Capillary: 111 mg/dL — ABNORMAL HIGH (ref 70–99)
Glucose-Capillary: 152 mg/dL — ABNORMAL HIGH (ref 70–99)

## 2022-10-26 LAB — CBC
HCT: 25.6 % — ABNORMAL LOW (ref 36.0–46.0)
Hemoglobin: 8.3 g/dL — ABNORMAL LOW (ref 12.0–15.0)
MCH: 30.3 pg (ref 26.0–34.0)
MCHC: 32.4 g/dL (ref 30.0–36.0)
MCV: 93.4 fL (ref 80.0–100.0)
Platelets: 367 10*3/uL (ref 150–400)
RBC: 2.74 MIL/uL — ABNORMAL LOW (ref 3.87–5.11)
RDW: 15.1 % (ref 11.5–15.5)
WBC: 11.7 10*3/uL — ABNORMAL HIGH (ref 4.0–10.5)
nRBC: 0 % (ref 0.0–0.2)

## 2022-10-26 LAB — HEPARIN LEVEL (UNFRACTIONATED)
Heparin Unfractionated: 0.17 IU/mL — ABNORMAL LOW (ref 0.30–0.70)
Heparin Unfractionated: 0.16 IU/mL — ABNORMAL LOW (ref 0.30–0.70)
Heparin Unfractionated: 0.4 IU/mL (ref 0.30–0.70)

## 2022-10-26 MED ORDER — HEPARIN BOLUS VIA INFUSION
1500.0000 [IU] | Freq: Once | INTRAVENOUS | Status: AC
  Administered 2022-10-26: 1500 [IU] via INTRAVENOUS
  Filled 2022-10-26: qty 1500

## 2022-10-26 MED ORDER — FUROSEMIDE 40 MG PO TABS
40.0000 mg | ORAL_TABLET | Freq: Every day | ORAL | Status: DC
  Administered 2022-10-26 – 2022-10-28 (×3): 40 mg via ORAL
  Filled 2022-10-26 (×3): qty 1

## 2022-10-26 NOTE — Progress Notes (Signed)
ANTICOAGULATION CONSULT NOTE - Follow Up Consult  Pharmacy Consult for heparin Indication:  LLE DVT  Allergies  Allergen Reactions   Celebrex [Celecoxib] Swelling   Keflet [Cephalexin] Swelling   Penicillins Hives and Swelling   Sulfa Antibiotics Swelling   Kenalog [Triamcinolone Acetonide]     unknown   Latex Other (See Comments)    No reaction listed on MAR   Lisinopril Other (See Comments)    unknown   Mobic [Meloxicam]     SWELLING   Penicillin G Other (See Comments)    No reaction listed on [facility]    Patient Measurements: Height: 5\' 2"  (157.5 cm) Weight: 70.5 kg (155 lb 6.8 oz) IBW/kg (Calculated) : 50.1 Heparin Dosing Weight: 70kg  Vital Signs: Temp: 97.9 F (36.6 C) (06/01 0555) Temp Source: Oral (06/01 0555) BP: 102/55 (06/01 0555) Pulse Rate: 57 (06/01 0555)  Labs: Recent Labs    10/24/22 0401 10/24/22 1810 10/25/22 0413 10/26/22 0150 10/26/22 1058  HGB 8.5*  --  8.8* 8.3*  --   HCT 26.9*  --  27.4* 25.6*  --   PLT 266  --  370 367  --   HEPARINUNFRC  --    < > 0.10* 0.17* 0.16*  CREATININE 2.17*  --   --  1.96*  --    < > = values in this interval not displayed.     Estimated Creatinine Clearance: 22.5 mL/min (A) (by C-G formula based on SCr of 1.96 mg/dL (H)).   Medical History: Past Medical History:  Diagnosis Date   Allergy    Bradycardia    Bronchitis, chronic (HCC)    Chronic anxiety    Complication of anesthesia    hard to wake up   COPD (chronic obstructive pulmonary disease) (HCC)    Depression    Esophageal reflux    Headache(784.0)    Hyperlipidemia    Hypertension    Hypothyroidism    Obesity    Osteoporosis    Overactive bladder    PVC's (premature ventricular contractions)    Thyroid disease    Vitamin D deficiency disease     Assessment: 77 year old female admitted with general malaise and fever, recent cdiff infection on po vancomycin taper. Patient with new cellulitis and now dopplers positive for LLE dvt. Patient  with ongoing anemia, hgb up to 8.5 this morning. Heparin restarted after IVC filter placement on 5/31.  HL 0.16- subtherapeutic Hgb 8.3   Goal of Therapy:  Heparin level 0.3-0.7 units/ml Monitor platelets by anticoagulation protocol: Yes   Plan:  Rebolus 1500 units IV x 1 Increase heparin infusion to 1600 units/hr Check heparin level in ~8hrs and daily Check CBC daily, monitor closely for signs/symptoms of bleeding  Judeth Cornfield, PharmD Clinical Pharmacist 10/26/2022 12:25 PM

## 2022-10-26 NOTE — Progress Notes (Signed)
ANTICOAGULATION CONSULT NOTE - Follow Up Consult  Pharmacy Consult for heparin Indication:  LLE DVT  Allergies  Allergen Reactions   Celebrex [Celecoxib] Swelling   Keflet [Cephalexin] Swelling   Penicillins Hives and Swelling   Sulfa Antibiotics Swelling   Kenalog [Triamcinolone Acetonide]     unknown   Latex Other (See Comments)    No reaction listed on MAR   Lisinopril Other (See Comments)    unknown   Mobic [Meloxicam]     SWELLING   Penicillin G Other (See Comments)    No reaction listed on [facility]    Patient Measurements: Height: 5\' 2"  (157.5 cm) Weight: 70.5 kg (155 lb 6.8 oz) IBW/kg (Calculated) : 50.1 Heparin Dosing Weight: 70kg  Vital Signs: Temp: 98.4 F (36.9 C) (06/01 1438) Temp Source: Oral (06/01 1438) BP: 112/65 (06/01 1438) Pulse Rate: 72 (06/01 1438)  Labs: Recent Labs    10/24/22 0401 10/24/22 1810 10/25/22 0413 10/26/22 0150 10/26/22 1058 10/26/22 1959  HGB 8.5*  --  8.8* 8.3*  --   --   HCT 26.9*  --  27.4* 25.6*  --   --   PLT 266  --  370 367  --   --   HEPARINUNFRC  --    < > 0.10* 0.17* 0.16* 0.40  CREATININE 2.17*  --   --  1.96*  --   --    < > = values in this interval not displayed.     Estimated Creatinine Clearance: 22.5 mL/min (A) (by C-G formula based on SCr of 1.96 mg/dL (H)).   Medical History: Past Medical History:  Diagnosis Date   Allergy    Bradycardia    Bronchitis, chronic (HCC)    Chronic anxiety    Complication of anesthesia    hard to wake up   COPD (chronic obstructive pulmonary disease) (HCC)    Depression    Esophageal reflux    Headache(784.0)    Hyperlipidemia    Hypertension    Hypothyroidism    Obesity    Osteoporosis    Overactive bladder    PVC's (premature ventricular contractions)    Thyroid disease    Vitamin D deficiency disease     Assessment: 77 year old female admitted with general malaise and fever, recent cdiff infection on po vancomycin taper. Patient with new cellulitis and  now dopplers positive for LLE dvt. Patient with ongoing anemia, hgb up to 8.5 this morning. Heparin restarted after IVC filter placement on 5/31.  Heparin level therapeutic at 0.4 this evening.  Goal of Therapy:  Heparin level 0.3-0.7 units/ml Monitor platelets by anticoagulation protocol: Yes   Plan:  Continue heparin 1600 units/h Confirmatory heparin level with am labs  Fredonia Highland, PharmD, BCPS, [facility] Clinical Pharmacist [phone] Please check AMION for all [facility] Pharmacy numbers 10/26/2022

## 2022-10-26 NOTE — Progress Notes (Signed)
Patient slept most of the night. 1 prn medication given. Dressing changed this am. Patient tolerated well.

## 2022-10-26 NOTE — Progress Notes (Signed)
ANTICOAGULATION CONSULT NOTE - Follow Up Consult  Pharmacy Consult for heparin Indication:  LLE DVT  Allergies  Allergen Reactions   Celebrex [Celecoxib] Swelling   Keflet [Cephalexin] Swelling   Penicillins Hives and Swelling   Sulfa Antibiotics Swelling   Kenalog [Triamcinolone Acetonide]     unknown   Latex Other (See Comments)    No reaction listed on MAR   Lisinopril Other (See Comments)    unknown   Mobic [Meloxicam]     SWELLING   Penicillin G Other (See Comments)    No reaction listed on [facility]    Patient Measurements: Height: 5\' 2"  (157.5 cm) Weight: 70.5 kg (155 lb 6.8 oz) IBW/kg (Calculated) : 50.1 Heparin Dosing Weight: 70kg  Vital Signs: Temp: 97.9 F (36.6 C) (06/01 0013) Temp Source: Oral (05/31 2030) BP: 109/66 (06/01 0013) Pulse Rate: 61 (06/01 0013)  Labs: Recent Labs    10/24/22 0401 10/24/22 1810 10/25/22 0413 10/26/22 0150  HGB 8.5*  --  8.8* 8.3*  HCT 26.9*  --  27.4* 25.6*  PLT 266  --  370 367  HEPARINUNFRC  --  <0.10* 0.10* 0.17*  CREATININE 2.17*  --   --  1.96*     Estimated Creatinine Clearance: 22.5 mL/min (A) (by C-G formula based on SCr of 1.96 mg/dL (H)).   Medical History: Past Medical History:  Diagnosis Date   Allergy    Bradycardia    Bronchitis, chronic (HCC)    Chronic anxiety    Complication of anesthesia    hard to wake up   COPD (chronic obstructive pulmonary disease) (HCC)    Depression    Esophageal reflux    Headache(784.0)    Hyperlipidemia    Hypertension    Hypothyroidism    Obesity    Osteoporosis    Overactive bladder    PVC's (premature ventricular contractions)    Thyroid disease    Vitamin D deficiency disease     Assessment: 77 year old female admitted with general malaise and fever, recent cdiff infection on po vancomycin taper. Patient with new cellulitis and now dopplers positive for LLE dvt. Patient with ongoing anemia, hgb up to 8.5 this morning. New orders to start IV heparin, will  need to watch cbc closely.   5/31 PM: restarting heparin infusion about 4 hours after IVC filter procedure w/out bolus. Previous heparin level subtherapeutic.  6/1 AM update:  Heparin level sub-therapeutic after re-start Hgb low but stable   Goal of Therapy:  Heparin level 0.3-0.7 units/ml Monitor platelets by anticoagulation protocol: Yes   Plan:  Inc heparin to 1450 units/hr 1100 heparin level Check CBC daily, monitor closely for signs/symptoms of bleeding  Abran Duke, PharmD, BCPS Clinical Pharmacist Phone: [phone]

## 2022-10-26 NOTE — Progress Notes (Signed)
PROGRESS NOTE     [name], is a 77 y.o. female, DOB - [birth year], ZOX:[id number]  Admit date - 10/19/2022   Admitting Physician Lilyan Gilford, DO  Outpatient Primary MD for the patient is [name], FNP  LOS - 7  Chief Complaint  Patient presents with   Fever        Brief Narrative:   Brief hospital admission narrative : Patient admitted from the skilled nursing facility secondary to general malaise and fever.  Recent admission approximately 2 weeks ago secondary to C. difficile infection.  Patient actively taking vancomycin tapering.  Still experiencing diarrhea.  Workup during this admission suggesting the presence of UTI.  Admitted for IV antibiotics and further evaluation/management.  Assessment and Plan: 1)Left Lower Extremity DVT--- discussed findings with reading radiologist Dr. Karle Starch -No DVT on the right despite clinical findings of erythema swelling of the right leg , surprisingly patient actually has an acute DVT as noted is on the left-even though left lower extremity exam is unremarkable -c/n  IV heparin (started 10/24/22)--- -Given patient's anemia and bleeding concerns get IR consult for possible IVC filter placement -Discussed with Dr. Miles Costain (IR) and patient's brother [name] (his wife [name]) and pt's son [name]---  --status post IVC filter placement on 10/25/2022 -If no bleeding on IV heparin with transition to DOAC and discharge back to SNF on Monday, 10/28/2022-- --she will need full anticoagulation for at least a month or so to prevent premature IVC filter failure  2)Klebsiella UTI (urinary tract infection) --Multiple antibiotic allergies -Ongoing C. difficile infection -ID pharmacist recommends stopping Cipro and using the linezolid for right leg cellulitis and Klebsiella UTI  3)Right leg cellulitis --with significant swelling and erythema -Please see photos in epic -Improving on Zyvox -Antibiotics as above #2  4)Dementia  with behavioral disturbance (HCC) -Continue supportive care and considering tension -Mood currently stable -Continue the use of Celexa -Palliative care consultation requested.  5)HFpEF/chronic diastolic dysfunction CHF  -echo with EF of 65 to 70% and grade 1 diastolic dysfunction  -Lasix as ordered- -Follow daily weights/strict I's and O's -Patient reports no shortness of breath and there is no oxygen requirement at the moment.  6)C. difficile colitis -Continue p.o. vancomycin taper protocol; -Continue supportive care maintain adequate hydration.  Thyroid disease -Continue Synthroid  AKI (acute kidney injury) (HCC) - Most likely due to dehydration/GI losses - After fluid resuscitation and supportive care creatinine essentially back to baseline -Continue to minimize nephrotoxic agents as much as possible, avoid the use of contrast and prevent hypotension. -Continue to follow renal function trend.   -Continue the use of sodium bicarbonate (patient with mild metabolic acidosis component). 10/26/22 -Continue currently around 2  Decubitus ulcer----actually appears to be improving -Wound VAC removed at SNF per report - Appreciate assistance and recommendation by wound care service -Continue constant repositioning, wound care and preventive barriers.  Chronic anemia - Anemia of chronic disease as prior anemia panel evaluation -Fecal occult blood test was positive; most likely in the setting of acute C. difficile colitis. -Current hemoglobin > 8 -Appreciate assistance and recommendation by gastroenterology service; for now no endoscopic evaluation acutely will be pursued. -Monitor Hgb closely with initiation of full anticoagulation  Anxiety - Continue as needed Atarax -Patient also chronically on Celexa. -Continue supportive care and constant reorientation.  Depression - Stable mood; no active hallucination -Continue the use of Celexa and trazodone.   B12 deficiency  anemia -B12 152 - continue supplementation (intramuscular injection daily x 7 days;  then weekly x 4; then monthly). -Repeat B12 level in 4-6 months.  Status is: Inpatient   Disposition: The patient is from: SNF              Anticipated d/c is to: SNF              Anticipated d/c date is: 2 days              Patient currently is not medically stable to d/c. Barriers: Not Clinically Stable-   Code Status :  -  Code Status: Full Code   Family Communication:    Previously discussed with patient's son [name] and patient's brother [name]  DVT Prophylaxis  :   - SCDs   SCDs Start: 10/19/22 2041   Lab Results  Component Value Date   PLT 367 10/26/2022   Inpatient Medications  Scheduled Meds:  (feeding supplement) PROSource Plus  30 mL Oral TID with meals   Chlorhexidine Gluconate Cloth  6 each Topical Q0600   [START ON 10/31/2022] citalopram  20 mg Oral Daily   cyanocobalamin  1,000 mcg Intramuscular Daily   levothyroxine  75 mcg Oral Q0600   linezolid  600 mg Oral Q12H   liver oil-zinc oxide   Topical TID   multivitamin with minerals  1 tablet Oral q morning   nystatin   Topical TID   Ensure Max Protein  237 mL Oral BID BM   rOPINIRole  1 mg Oral BID   sodium bicarbonate  650 mg Oral TID   traZODone  50 mg Oral QHS   vancomycin  125 mg Oral BID   Followed by   Melene Muller ON 11/01/2022] vancomycin  125 mg Oral Daily   Followed by   Melene Muller ON 11/08/2022] vancomycin  125 mg Oral QODAY   Followed by   Melene Muller ON 11/16/2022] vancomycin  125 mg Oral Q3 days   zinc sulfate  220 mg Oral q morning   Continuous Infusions:  heparin 1,600 Units/hr (10/26/22 1313)   PRN Meds:.acetaminophen **OR** acetaminophen, hydrOXYzine, ondansetron **OR** ondansetron (ZOFRAN) IV, oxyCODONE   Anti-infectives (From admission, onward)    Start     Dose/Rate Route Frequency Ordered Stop   11/16/22 1000  vancomycin (VANCOCIN) capsule 125 mg       See Hyperspace for full Linked Orders Report.   125 mg  Oral Every 3 DAYS 10/25/22 1021 11/25/22 0959   11/08/22 1000  vancomycin (VANCOCIN) capsule 125 mg       See Hyperspace for full Linked Orders Report.   125 mg Oral Every other day 10/25/22 1021 11/16/22 0959   11/01/22 1000  vancomycin (VANCOCIN) capsule 125 mg       See Hyperspace for full Linked Orders Report.   125 mg Oral Daily 10/25/22 1021 11/08/22 0959   10/25/22 1115  vancomycin (VANCOCIN) capsule 125 mg       See Hyperspace for full Linked Orders Report.   125 mg Oral 2 times daily 10/25/22 1021 11/01/22 0959   10/24/22 1330  linezolid (ZYVOX) tablet 600 mg        600 mg Oral Every 12 hours 10/24/22 1239 10/31/22 0959   10/23/22 2200  doxycycline (VIBRA-TABS) tablet 100 mg  Status:  Discontinued        100 mg Oral Every 12 hours 10/23/22 1637 10/24/22 1239   10/21/22 1000  vancomycin (VANCOCIN) capsule 125 mg  Status:  Discontinued        125 mg Oral 4 times daily  10/21/22 0805 10/25/22 1021   10/19/22 2200  vancomycin (VANCOCIN) capsule 125 mg  Status:  Discontinued       Note to Pharmacy: Take Vancomycin  125 mg--1 capsule 4 times a day for 14 days, followed by 1 capsule twice daily for 7 days, then 1 capsule once daily for 7 days, then 1 capsule every other day for 7 days, then 1 capsule every 3 days for 7 days then Stop     125 mg Oral 2 times daily 10/19/22 2040 10/21/22 0844   10/19/22 2015  ciprofloxacin (CIPRO) IVPB 400 mg        400 mg 200 mL/hr over 60 Minutes Intravenous Every 24 hours 10/19/22 1959 10/23/22 2227       Subjective: [name] today has no fevers, no emesis,  No chest pain,   - Resting comfortably -No bleeding concerns -Oral intake is fair  Objective: Vitals:   10/25/22 2030 10/26/22 0013 10/26/22 0555 10/26/22 1438  BP: (!) 100/54 109/66 (!) 102/55 112/65  Pulse: 63 61 (!) 57 72  Resp: 16 16 16 16   Temp: 97.8 F (36.6 C) 97.9 F (36.6 C) 97.9 F (36.6 C) 98.4 F (36.9 C)  TempSrc: Oral  Oral Oral  SpO2: 100% 99% 97% 97%  Weight:       Height:        Intake/Output Summary (Last 24 hours) at 10/26/2022 1630 Last data filed at 10/26/2022 1500 Gross per 24 hour  Intake 3048.52 ml  Output 2100 ml  Net 948.52 ml   Filed Weights   10/19/22 1508 10/19/22 2044  Weight: 67.5 kg 70.5 kg    Physical Exam Gen:- Awake Alert, in no acute distress  HEENT:- [place].AT, No sclera icterus Neck-Supple Neck,No JVD,.  Lungs-  CTAB , fair air movement bilaterally  CV- S1, S2 normal, RRR Abd-  +ve B.Sounds, Abd Soft, No tenderness,    Extremity--- Rt leg swelling and erythema is improving --- -good pedal pulses  -Despite Doppler finding of extensive deep to the left lower extremity,  left LE exam is largely unremarkable (no left leg or thigh swelling, no tenderness, no warmth, Psych-affect is appropriate, oriented x3, forgetful at times Neuro-no new focal deficits, no tremors GU-Foley in situ with clear urine--apparently Foley was placed on 10/22/2022 to allow for wound healing Skin--sacral decub appears to be improving--  Data Reviewed: I have personally reviewed following labs and imaging studies  CBC: Recent Labs  Lab 10/20/22 0417 10/22/22 0408 10/24/22 0401 10/25/22 0413 10/26/22 0150  WBC 10.2 10.4 12.4* 12.7* 11.7*  NEUTROABS 9.5*  --   --   --   --   HGB 9.0* 7.8* 8.5* 8.8* 8.3*  HCT 28.7* 25.1* 26.9* 27.4* 25.6*  MCV 96.0 95.1 94.4 92.9 93.4  PLT 230 186 266 370 367   Basic Metabolic Panel: Recent Labs  Lab 10/20/22 0417 10/22/22 0408 10/24/22 0401 10/26/22 0150  NA 133* 130* 131* 132*  K 4.3 4.0 4.2 4.5  CL 104 102 100 103  CO2 17* 17* 23 22  GLUCOSE 73 82 83 145*  BUN 55* 56* 55* 53*  CREATININE 2.09* 2.17* 2.17* 1.96*  CALCIUM 7.7* 7.4* 7.8* 7.5*  MG 1.8  --   --   --   PHOS  --   --   --  3.9   GFR: Estimated Creatinine Clearance: 22.5 mL/min (A) (by C-G formula based on SCr of 1.96 mg/dL (H)). Liver Function Tests: Recent Labs  Lab 10/20/22 0417 10/26/22  0150  AST 37  --   ALT 37  --    ALKPHOS 188*  --   BILITOT 0.9  --   PROT 5.5*  --   ALBUMIN 2.0* 1.6*   Recent Results (from the past 240 hour(s))  Culture, blood (routine x 2)     Status: None   Collection Time: 10/19/22  3:53 PM   Specimen: Right Antecubital; Blood  Result Value Ref Range Status   Specimen Description   Final    RIGHT ANTECUBITAL BOTTLES DRAWN AEROBIC AND ANAEROBIC   Special Requests   Final    Blood Culture results may not be optimal due to an excessive volume of blood received in culture bottles   Culture   Final    NO GROWTH 5 DAYS Performed at [facility], [address] [place], [place] [address]    Report Status 10/24/2022 FINAL  Final  Culture, blood (routine x 2)     Status: None   Collection Time: 10/19/22  4:06 PM   Specimen: Left Antecubital; Blood  Result Value Ref Range Status   Specimen Description   Final    LEFT ANTECUBITAL BOTTLES DRAWN AEROBIC AND ANAEROBIC   Special Requests   Final    Blood Culture results may not be optimal due to an excessive volume of blood received in culture bottles   Culture   Final    NO GROWTH 5 DAYS Performed at [facility], [address], [place], [place] [address]    Report Status 10/24/2022 FINAL  Final  Urine Culture     Status: Abnormal   Collection Time: 10/19/22  6:55 PM   Specimen: Urine, Random  Result Value Ref Range Status   Specimen Description   Final    URINE, RANDOM Performed at [facility], [address], [place], [place] [address]    Special Requests   Final    URINE, RANDOM Performed at [facility], [address], [place], [place] [address]    Culture >=100,000 COLONIES/mL KLEBSIELLA PNEUMONIAE (A)  Final   Report Status 10/22/2022 FINAL  Final   Organism ID, Bacteria KLEBSIELLA PNEUMONIAE (A)  Final      Susceptibility   Klebsiella pneumoniae - MIC*    AMPICILLIN >=32 RESISTANT Resistant     CEFAZOLIN <=4 SENSITIVE Sensitive     CEFEPIME <=0.12 SENSITIVE Sensitive     CEFTRIAXONE <=0.25 SENSITIVE  Sensitive     CIPROFLOXACIN <=0.25 SENSITIVE Sensitive     GENTAMICIN <=1 SENSITIVE Sensitive     IMIPENEM <=0.25 SENSITIVE Sensitive     NITROFURANTOIN 32 SENSITIVE Sensitive     TRIMETH/SULFA <=20 SENSITIVE Sensitive     AMPICILLIN/SULBACTAM 4 SENSITIVE Sensitive     PIP/TAZO <=4 SENSITIVE Sensitive     * >=100,000 COLONIES/mL KLEBSIELLA PNEUMONIAE     Radiology Studies: IR IVC FILTER PLMT / S&I /IMG GUID/MOD SED  Result Date: 10/25/2022 INDICATION: Extensive multiple comorbidities including sacral decubitus ulcers, sedentary patient, chronic anemia, acute lower extremity DVT EXAM: ULTRASOUND GUIDANCE FOR VASCULAR ACCESS IVC CATHETERIZATION AND VENOGRAM IVC FILTER INSERTION Date:  10/25/2022 10/25/2022 1:13 pm Radiologist:  Judie Petit. Ruel Favors, MD Guidance:  Ultrasound and fluoroscopic CONTRAST:  CO2 utilized because of an elevated creatinine MEDICATIONS: 1% lidocaine low ANESTHESIA/SEDATION: Moderate Sedation Time: None. The patient was continuously monitored during the procedure by the interventional radiology nurse under my direct supervision. FLUOROSCOPY: Fluoroscopy Time: 1 minutes 6 seconds (37 mGy). COMPLICATIONS: None immediate. PROCEDURE: Informed consent was obtained from the patient  following explanation of the procedure, risks, benefits and alternatives. The patient understands, agrees and consents for the procedure. All questions were addressed. A time out was performed. Maximal barrier sterile technique utilized including caps, mask, sterile gowns, sterile gloves, large sterile drape, hand hygiene, and betadine prep. Under sterile condition and local anesthesia, right internal jugular venous access was performed with ultrasound. Images obtained for documentation of the patent right internal jugular vein. Over a guide wire, the IVC filter delivery sheath and inner dilator were advanced into the IVC just above the IVC bifurcation. Contrast injection was performed for an IVC venogram. IVC  VENOGRAM: The IVC is patent. No evidence of thrombus, stenosis, or occlusion. No variant venous anatomy. The renal veins are identified at L1-2. IVC FILTER INSERTION: Through the delivery sheath, the Bard Denali IVC filter was deployed in the infrarenal IVC at the L3 level just below the renal veins and above the IVC bifurcation. Contrast injection confirmed position. There is good apposition of the filter against the IVC. The delivery sheath was removed and hemostasis was obtained with compression for 5 minutes. The patient tolerated the procedure well. No immediate complications. IMPRESSION: Ultrasound and fluoroscopically guided infrarenal IVC filter insertion. PLAN: Due to patient related comorbidities and/or clinical necessity, this IVC filter should be considered a permanent device. This patient will not be actively followed for future filter retrieval. Electronically Signed   By: Judie Petit.  Shick M.D.   On: 10/25/2022 13:45    Scheduled Meds:  (feeding supplement) PROSource Plus  30 mL Oral TID with meals   Chlorhexidine Gluconate Cloth  6 each Topical Q0600   [START ON 10/31/2022] citalopram  20 mg Oral Daily   cyanocobalamin  1,000 mcg Intramuscular Daily   levothyroxine  75 mcg Oral Q0600   linezolid  600 mg Oral Q12H   liver oil-zinc oxide   Topical TID   multivitamin with minerals  1 tablet Oral q morning   nystatin   Topical TID   Ensure Max Protein  237 mL Oral BID BM   rOPINIRole  1 mg Oral BID   sodium bicarbonate  650 mg Oral TID   traZODone  50 mg Oral QHS   vancomycin  125 mg Oral BID   Followed by   Melene Muller ON 11/01/2022] vancomycin  125 mg Oral Daily   Followed by   Melene Muller ON 11/08/2022] vancomycin  125 mg Oral QODAY   Followed by   Melene Muller ON 11/16/2022] vancomycin  125 mg Oral Q3 days   zinc sulfate  220 mg Oral q morning   Continuous Infusions:  heparin 1,600 Units/hr (10/26/22 1313)    LOS: 7 days   Shon Hale M.D on 10/26/2022 at 4:30 PM  Go to [email or web address] - for  contact info  [facility] - Office  [phone]  If 7PM-7AM, please contact night-coverage [email or web address] 10/26/2022, 4:30 PM

## 2022-10-27 DIAGNOSIS — F03918 Unspecified dementia, unspecified severity, with other behavioral disturbance: Secondary | ICD-10-CM | POA: Diagnosis not present

## 2022-10-27 DIAGNOSIS — N179 Acute kidney failure, unspecified: Secondary | ICD-10-CM | POA: Diagnosis not present

## 2022-10-27 DIAGNOSIS — I82402 Acute embolism and thrombosis of unspecified deep veins of left lower extremity: Secondary | ICD-10-CM | POA: Diagnosis not present

## 2022-10-27 LAB — GLUCOSE, CAPILLARY
Glucose-Capillary: 72 mg/dL (ref 70–99)
Glucose-Capillary: 79 mg/dL (ref 70–99)
Glucose-Capillary: 81 mg/dL (ref 70–99)

## 2022-10-27 LAB — CBC
HCT: 26.7 % — ABNORMAL LOW (ref 36.0–46.0)
Hemoglobin: 8.3 g/dL — ABNORMAL LOW (ref 12.0–15.0)
MCH: 29.4 pg (ref 26.0–34.0)
MCHC: 31.1 g/dL (ref 30.0–36.0)
MCV: 94.7 fL (ref 80.0–100.0)
Platelets: 415 10*3/uL — ABNORMAL HIGH (ref 150–400)
RBC: 2.82 MIL/uL — ABNORMAL LOW (ref 3.87–5.11)
RDW: 15.5 % (ref 11.5–15.5)
WBC: 13.7 10*3/uL — ABNORMAL HIGH (ref 4.0–10.5)
nRBC: 0 % (ref 0.0–0.2)

## 2022-10-27 LAB — HEPARIN LEVEL (UNFRACTIONATED): Heparin Unfractionated: 0.39 IU/mL (ref 0.30–0.70)

## 2022-10-27 NOTE — Progress Notes (Signed)
PROGRESS NOTE     [name], is a 77 y.o. female, DOB - 1945-06-13, ZOX:[id number]  Admit date - 10/19/2022   Admitting Physician Cindy Gilford, DO  Outpatient Primary MD for the patient is Cindy Pierini, FNP  LOS - 8  Chief Complaint  Patient presents with   Fever       -Brief summary Patient admitted from the skilled nursing facility secondary to general malaise and fever.  Recent admission approximately 2 weeks ago secondary to C. difficile infection.  Patient actively taking vancomycin tapering.  Still experiencing diarrhea.  Workup during this admission suggesting the presence of UTI.  Admitted for IV antibiotics and further evaluation/management. -Found to have left lower extremity DVT, had IVC filter placed on 10/25/2022, awaiting transition from IV heparin to DOAC if no bleeding and then discharged home  Assessment and Plan: 1)Left Lower Extremity DVT--- discussed findings with reading radiologist [name] -No DVT on the right despite clinical findings of erythema swelling of the right leg , surprisingly patient actually has an acute DVT as noted is on the left-even though left lower extremity exam is unremarkable -c/n  IV heparin (started 10/24/22)--- -Given patient's anemia and bleeding concerns get IR consult for possible IVC filter placement -Discussed with [name] (IR) and patient's brother [name] (his wife [name]) and pt's son [name]---  --status post IVC filter placement on 10/25/2022 -If no bleeding on IV heparin with transition to DOAC and discharge back to SNF on Monday, 10/28/2022-- --she will need full anticoagulation for at least a month or so to prevent premature IVC filter failure 10/27/22 -Hgb stable above 8, no bleeding concerns at this time  2)Klebsiella UTI (urinary tract infection) --Multiple antibiotic allergies -Ongoing C. difficile infection -ID pharmacist recommends stopping Cipro and using the linezolid for right leg  cellulitis and Klebsiella UTI -Anticipate lotion and treatment for total of 5 to 7 days--mostly for the cellulitis rather than the UTI  3)Right leg cellulitis --with significant swelling and erythema -Please see photos in epic -Improving on Zyvox -Antibiotics as above #2  4)Dementia with behavioral disturbance (HCC) -Continue supportive care and considering tension -Mood currently stable -Continue the use of Celexa -Palliative care consultation requested.  5)HFpEF/chronic diastolic dysfunction CHF  -echo with EF of 65 to 70% and grade 1 diastolic dysfunction  -Lasix as ordered- -Follow daily weights/strict I's and O's -Patient reports no shortness of breath and there is no oxygen requirement at the moment.  6)C. difficile colitis -Continue p.o. vancomycin taper protocol; -Continue supportive care maintain adequate hydration.  Thyroid disease -Continue Synthroid  AKI (acute kidney injury) (HCC) - Most likely due to dehydration/GI losses - After fluid resuscitation and supportive care creatinine essentially back to baseline -Continue to minimize nephrotoxic agents as much as possible, avoid the use of contrast and prevent hypotension. -Continue to follow renal function trend.   -Continue the use of sodium bicarbonate (patient with mild metabolic acidosis component). 10/27/22 -Creatinine currently around 2  Decubitus ulcer----actually appears to be improving -Wound VAC removed at SNF per report - Appreciate assistance and recommendation by wound care service -Continue constant repositioning, wound care and preventive barriers.  Chronic anemia - Anemia of chronic disease as prior anemia panel evaluation -Fecal occult blood test was positive; most likely in the setting of acute C. difficile colitis. -Current hemoglobin > 8 -Appreciate assistance and recommendation by gastroenterology service; for now no endoscopic evaluation acutely will be pursued. -Monitor Hgb closely with  initiation of full anticoagulation  Anxiety - Continue as needed Atarax -Patient also chronically on Celexa. -Continue supportive care and constant reorientation.  Depression - Stable mood; no active hallucination -Continue the use of Celexa and trazodone.   B12 deficiency anemia -B12 152 - continue supplementation (intramuscular injection daily x 7 days; then weekly x 4; then monthly). -Repeat B12 level in 4-6 months.  Status is: Inpatient   Disposition: The patient is from: SNF              Anticipated d/c is to: SNF              Anticipated d/c date is: 1 day              Patient currently is not medically stable to d/c. Barriers: Not Clinically Stable-   Code Status :  -  Code Status: Full Code   Family Communication:    Previously discussed with patient's son [name] and patient's brother [name]  DVT Prophylaxis  :   - SCDs   SCDs Start: 10/19/22 2041   Lab Results  Component Value Date   PLT 415 (H) 10/27/2022   Inpatient Medications  Scheduled Meds:  (feeding supplement) PROSource Plus  30 mL Oral TID with meals   Chlorhexidine Gluconate Cloth  6 each Topical Q0600   [START ON 10/31/2022] citalopram  20 mg Oral Daily   cyanocobalamin  1,000 mcg Intramuscular Daily   furosemide  40 mg Oral Daily   levothyroxine  75 mcg Oral Q0600   linezolid  600 mg Oral Q12H   liver oil-zinc oxide   Topical TID   multivitamin with minerals  1 tablet Oral q morning   nystatin   Topical TID   Ensure Max Protein  237 mL Oral BID BM   rOPINIRole  1 mg Oral BID   sodium bicarbonate  650 mg Oral TID   traZODone  50 mg Oral QHS   vancomycin  125 mg Oral BID   Followed by   Melene Muller ON 11/01/2022] vancomycin  125 mg Oral Daily   Followed by   Melene Muller ON 11/08/2022] vancomycin  125 mg Oral QODAY   Followed by   Melene Muller ON 11/16/2022] vancomycin  125 mg Oral Q3 days   zinc sulfate  220 mg Oral q morning   Continuous Infusions:  heparin 1,600 Units/hr (10/27/22 0015)   PRN  Meds:.acetaminophen **OR** acetaminophen, hydrOXYzine, ondansetron **OR** ondansetron (ZOFRAN) IV, oxyCODONE   Anti-infectives (From admission, onward)    Start     Dose/Rate Route Frequency Ordered Stop   11/16/22 1000  vancomycin (VANCOCIN) capsule 125 mg       See Hyperspace for full Linked Orders Report.   125 mg Oral Every 3 DAYS 10/25/22 1021 11/25/22 0959   11/08/22 1000  vancomycin (VANCOCIN) capsule 125 mg       See Hyperspace for full Linked Orders Report.   125 mg Oral Every other day 10/25/22 1021 11/16/22 0959   11/01/22 1000  vancomycin (VANCOCIN) capsule 125 mg       See Hyperspace for full Linked Orders Report.   125 mg Oral Daily 10/25/22 1021 11/08/22 0959   10/25/22 1115  vancomycin (VANCOCIN) capsule 125 mg       See Hyperspace for full Linked Orders Report.   125 mg Oral 2 times daily 10/25/22 1021 11/01/22 0959   10/24/22 1330  linezolid (ZYVOX) tablet 600 mg        600 mg Oral Every 12 hours 10/24/22 1239 10/31/22 0959  10/23/22 2200  doxycycline (VIBRA-TABS) tablet 100 mg  Status:  Discontinued        100 mg Oral Every 12 hours 10/23/22 1637 10/24/22 1239   10/21/22 1000  vancomycin (VANCOCIN) capsule 125 mg  Status:  Discontinued        125 mg Oral 4 times daily 10/21/22 0805 10/25/22 1021   10/19/22 2200  vancomycin (VANCOCIN) capsule 125 mg  Status:  Discontinued       Note to Pharmacy: Take Vancomycin  125 mg--1 capsule 4 times a day for 14 days, followed by 1 capsule twice daily for 7 days, then 1 capsule once daily for 7 days, then 1 capsule every other day for 7 days, then 1 capsule every 3 days for 7 days then Stop     125 mg Oral 2 times daily 10/19/22 2040 10/21/22 0844   10/19/22 2015  ciprofloxacin (CIPRO) IVPB 400 mg        400 mg 200 mL/hr over 60 Minutes Intravenous Every 24 hours 10/19/22 1959 10/23/22 2227       Subjective: [name] Sidell today has no fevers, no emesis,  No chest pain,   - -Continues to complain from time to time of sacral  area pain/discomfort,  consistent with her sacral decub location -No bleeding concerns -Oral intake is fair  Objective: Vitals:   10/26/22 0555 10/26/22 1438 10/26/22 2059 10/27/22 0542  BP: (!) 102/55 112/65 (!) 104/58 (!) 115/59  Pulse: (!) 57 72 (!) 59 (!) 54  Resp: 16 16  12   Temp: 97.9 F (36.6 C) 98.4 F (36.9 C) 99 F (37.2 C) 98 F (36.7 C)  TempSrc: Oral Oral Oral Oral  SpO2: 97% 97% 100% 99%  Weight:      Height:        Intake/Output Summary (Last 24 hours) at 10/27/2022 0839 Last data filed at 10/27/2022 0527 Gross per 24 hour  Intake 2808.52 ml  Output 1900 ml  Net 908.52 ml   Filed Weights   10/19/22 1508 10/19/22 2044  Weight: 67.5 kg 70.5 kg    Physical Exam Gen:- Awake Alert, in no acute distress  HEENT:- [place].AT, No sclera icterus Neck-Supple Neck,No JVD,.  Lungs-  CTAB , fair air movement bilaterally  CV- S1, S2 normal, RRR Abd-  +ve B.Sounds, Abd Soft, No tenderness,    Extremity--- Rt leg swelling and erythema is improving --- -good pedal pulses  -Despite Doppler finding of extensive deep to the left lower extremity,  left LE exam is largely unremarkable (no left leg or thigh swelling, no tenderness, no warmth, Psych-affect is appropriate, oriented x3, forgetful at times Neuro-no new focal deficits, no tremors GU-Foley in situ with clear urine--apparently Foley was placed on 10/22/2022 to allow for wound healing Skin--sacral decub appears to be improving/healing slowly-  Data Reviewed: I have personally reviewed following labs and imaging studies  CBC: Recent Labs  Lab 10/22/22 0408 10/24/22 0401 10/25/22 0413 10/26/22 0150 10/27/22 0423  WBC 10.4 12.4* 12.7* 11.7* 13.7*  HGB 7.8* 8.5* 8.8* 8.3* 8.3*  HCT 25.1* 26.9* 27.4* 25.6* 26.7*  MCV 95.1 94.4 92.9 93.4 94.7  PLT 186 266 370 367 415*   Basic Metabolic Panel: Recent Labs  Lab 10/22/22 0408 10/24/22 0401 10/26/22 0150  NA 130* 131* 132*  K 4.0 4.2 4.5  CL 102 100 103  CO2 17*  23 22  GLUCOSE 82 83 145*  BUN 56* 55* 53*  CREATININE 2.17* 2.17* 1.96*  CALCIUM 7.4* 7.8* 7.5*  PHOS  --   --  3.9   GFR: Estimated Creatinine Clearance: 22.5 mL/min (A) (by C-G formula based on SCr of 1.96 mg/dL (H)). Liver Function Tests: Recent Labs  Lab 10/26/22 0150  ALBUMIN 1.6*   Recent Results (from the past 240 hour(s))  Culture, blood (routine x 2)     Status: None   Collection Time: 10/19/22  3:53 PM   Specimen: Right Antecubital; Blood  Result Value Ref Range Status   Specimen Description   Final    RIGHT ANTECUBITAL BOTTLES DRAWN AEROBIC AND ANAEROBIC   Special Requests   Final    Blood Culture results may not be optimal due to an excessive volume of blood received in culture bottles   Culture   Final    NO GROWTH 5 DAYS Performed at [facility], [address], [place], [place] [address]    Report Status 10/24/2022 FINAL  Final  Culture, blood (routine x 2)     Status: None   Collection Time: 10/19/22  4:06 PM   Specimen: Left Antecubital; Blood  Result Value Ref Range Status   Specimen Description   Final    LEFT ANTECUBITAL BOTTLES DRAWN AEROBIC AND ANAEROBIC   Special Requests   Final    Blood Culture results may not be optimal due to an excessive volume of blood received in culture bottles   Culture   Final    NO GROWTH 5 DAYS Performed at [facility], [address] [place], [place] [address]    Report Status 10/24/2022 FINAL  Final  Urine Culture     Status: Abnormal   Collection Time: 10/19/22  6:55 PM   Specimen: Urine, Random  Result Value Ref Range Status   Specimen Description   Final    URINE, RANDOM Performed at [facility], [address] [place], [place] [address]    Special Requests   Final    URINE, RANDOM Performed at [facility], [address], [place], [place] [address]    Culture >=100,000 COLONIES/mL KLEBSIELLA PNEUMONIAE (A)  Final   Report Status 10/22/2022 FINAL  Final   Organism ID, Bacteria KLEBSIELLA  PNEUMONIAE (A)  Final      Susceptibility   Klebsiella pneumoniae - MIC*    AMPICILLIN >=32 RESISTANT Resistant     CEFAZOLIN <=4 SENSITIVE Sensitive     CEFEPIME <=0.12 SENSITIVE Sensitive     CEFTRIAXONE <=0.25 SENSITIVE Sensitive     CIPROFLOXACIN <=0.25 SENSITIVE Sensitive     GENTAMICIN <=1 SENSITIVE Sensitive     IMIPENEM <=0.25 SENSITIVE Sensitive     NITROFURANTOIN 32 SENSITIVE Sensitive     TRIMETH/SULFA <=20 SENSITIVE Sensitive     AMPICILLIN/SULBACTAM 4 SENSITIVE Sensitive     PIP/TAZO <=4 SENSITIVE Sensitive     * >=100,000 COLONIES/mL KLEBSIELLA PNEUMONIAE     Radiology Studies: IR IVC FILTER PLMT / S&I /IMG GUID/MOD SED  Result Date: 10/25/2022 INDICATION: Extensive multiple comorbidities including sacral decubitus ulcers, sedentary patient, chronic anemia, acute lower extremity DVT EXAM: ULTRASOUND GUIDANCE FOR VASCULAR ACCESS IVC CATHETERIZATION AND VENOGRAM IVC FILTER INSERTION Date:  10/25/2022 10/25/2022 1:13 pm Radiologist:  Judie Petit. Ruel Favors, MD Guidance:  Ultrasound and fluoroscopic CONTRAST:  CO2 utilized because of an elevated creatinine MEDICATIONS: 1% lidocaine low ANESTHESIA/SEDATION: Moderate Sedation Time: None. The patient was continuously monitored during the procedure by the interventional radiology nurse under my direct supervision. FLUOROSCOPY: Fluoroscopy Time: 1 minutes 6 seconds (37 mGy). COMPLICATIONS: None immediate. PROCEDURE: Informed consent was obtained  from the patient following explanation of the procedure, risks, benefits and alternatives. The patient understands, agrees and consents for the procedure. All questions were addressed. A time out was performed. Maximal barrier sterile technique utilized including caps, mask, sterile gowns, sterile gloves, large sterile drape, hand hygiene, and betadine prep. Under sterile condition and local anesthesia, right internal jugular venous access was performed with ultrasound. Images obtained for documentation of  the patent right internal jugular vein. Over a guide wire, the IVC filter delivery sheath and inner dilator were advanced into the IVC just above the IVC bifurcation. Contrast injection was performed for an IVC venogram. IVC VENOGRAM: The IVC is patent. No evidence of thrombus, stenosis, or occlusion. No variant venous anatomy. The renal veins are identified at L1-2. IVC FILTER INSERTION: Through the delivery sheath, the Bard Denali IVC filter was deployed in the infrarenal IVC at the L3 level just below the renal veins and above the IVC bifurcation. Contrast injection confirmed position. There is good apposition of the filter against the IVC. The delivery sheath was removed and hemostasis was obtained with compression for 5 minutes. The patient tolerated the procedure well. No immediate complications. IMPRESSION: Ultrasound and fluoroscopically guided infrarenal IVC filter insertion. PLAN: Due to patient related comorbidities and/or clinical necessity, this IVC filter should be considered a permanent device. This patient will not be actively followed for future filter retrieval. Electronically Signed   By: Judie Petit.  Shick M.D.   On: 10/25/2022 13:45    Scheduled Meds:  (feeding supplement) PROSource Plus  30 mL Oral TID with meals   Chlorhexidine Gluconate Cloth  6 each Topical Q0600   [START ON 10/31/2022] citalopram  20 mg Oral Daily   cyanocobalamin  1,000 mcg Intramuscular Daily   furosemide  40 mg Oral Daily   levothyroxine  75 mcg Oral Q0600   linezolid  600 mg Oral Q12H   liver oil-zinc oxide   Topical TID   multivitamin with minerals  1 tablet Oral q morning   nystatin   Topical TID   Ensure Max Protein  237 mL Oral BID BM   rOPINIRole  1 mg Oral BID   sodium bicarbonate  650 mg Oral TID   traZODone  50 mg Oral QHS   vancomycin  125 mg Oral BID   Followed by   Melene Muller ON 11/01/2022] vancomycin  125 mg Oral Daily   Followed by   Melene Muller ON 11/08/2022] vancomycin  125 mg Oral QODAY   Followed by    Melene Muller ON 11/16/2022] vancomycin  125 mg Oral Q3 days   zinc sulfate  220 mg Oral q morning   Continuous Infusions:  heparin 1,600 Units/hr (10/27/22 0015)    LOS: 8 days   Shon Hale M.D on 10/27/2022 at 8:39 AM  Go to [email or web address] - for contact info  [facility] - Office  [phone]  If 7PM-7AM, please contact night-coverage [email or web address] 10/27/2022, 8:39 AM

## 2022-10-27 NOTE — Evaluation (Signed)
Physical Therapy Evaluation Patient Details Name: [name] MRN: [id number] DOB: Dec 18, 1945 Today's Date: 10/27/2022  History of Present Illness  [name] is a 77 y.o. female with medical history significant of anxiety, depression, COPD, GERD, hyperlipidemia, hypertension, hypothyroidism, decubitus ulcer, and more presents the ED with a chief complaint of fever and weakness.  It is reported that patient had a fever of 101 at 1030 and was given acetaminophen.  Fever was then 102 at 1400.  She was given another 500 mg of acetaminophen and transferred to the hospital.  Patient does have a history of C. difficile and has been having diarrhea for the last 2 days.  She is currently on vancomycin p.o. patient is unsure why she is here in the hospital.  She thinks it is because of her decubitus ulcer.  That was the answer that was given after a lot of reorientation.  At first when asked why she is in the hospital she answered "I am concerned about that too."  Patient is resting comfortably at the time of my exam.  She is somnolent.  She has concentrated urine in the pure wick canister.  Patient has no other complaints at this time.   Clinical Impression  Patient demonstrates slow labored movement for sitting up at bedside with c/o discomfort over bottom due to sacral wound, very unsteady on feet and limited to a few side steps, unable to take steps forward/backwards mostly due to c/o fatigue and tolerated sitting up in chair after therapy - nurse aware. Patient will benefit from continued skilled physical therapy in hospital and recommended venue below to increase strength, balance, endurance for safe ADLs and gait.         Recommendations for follow up therapy are one component of a multi-disciplinary discharge planning process, led by the attending physician.  Recommendations may be updated based on patient status, additional functional criteria and insurance authorization.  Follow Up Recommendations  Can patient physically be transported by private vehicle: No     Assistance Recommended at Discharge Set up Supervision/Assistance  Patient can return home with the following  A lot of help with bathing/dressing/bathroom;A lot of help with walking and/or transfers;Help with stairs or ramp for entrance;Assistance with cooking/housework    Equipment Recommendations None recommended by PT  Recommendations for Other Services       Functional Status Assessment Patient has had a recent decline in their functional status and demonstrates the ability to make significant improvements in function in a reasonable and predictable amount of time.     Precautions / Restrictions Precautions Precautions: Fall Restrictions Weight Bearing Restrictions: No      Mobility  Bed Mobility Overal bed mobility: Needs Assistance Bed Mobility: Supine to Sit     Supine to sit: Min assist, Mod assist     General bed mobility comments: increased time, labored movement    Transfers Overall transfer level: Needs assistance Equipment used: Rolling walker (2 wheels) Transfers: Sit to/from Stand, Bed to chair/wheelchair/BSC Sit to Stand: Min assist, Mod assist   Step pivot transfers: Mod assist       General transfer comment: unsteady labored movement    Ambulation/Gait Ambulation/Gait assistance: Mod assist, Max assist Gait Distance (Feet): 4 Feet Assistive device: Rolling walker (2 wheels) Gait Pattern/deviations: Decreased step length - right, Decreased step length - left, Decreased stride length, Shuffle Gait velocity: slow     General Gait Details: limited to a few slow labored unsteady side steps before having to sit due to  c/o fatigue and BLE weakness  Stairs            Wheelchair Mobility    Modified Rankin (Stroke Patients Only)       Balance Overall balance assessment: Needs assistance Sitting-balance support: Feet supported, No upper extremity supported Sitting  balance-Leahy Scale: Fair Sitting balance - Comments: fair/good seated at EOB   Standing balance support: During functional activity, Bilateral upper extremity supported, Reliant on assistive device for balance Standing balance-Leahy Scale: Poor Standing balance comment: using RW                             Pertinent Vitals/Pain Pain Assessment Pain Assessment: Faces Faces Pain Scale: Hurts a little bit Pain Location: sacral area a site of pressure sore Pain Descriptors / Indicators: Sore, Discomfort    Home Living Family/patient expects to be discharged to:: Private residence Living Arrangements: Children Available Help at Discharge: Family;Available 24 hours/day Type of Home: House Home Access: Ramped entrance     Alternate Level Stairs-Number of Steps: Patient states she does not go upstairs Home Layout: Two level;Able to live on main level with bedroom/bathroom Home Equipment: Rolling Walker (2 wheels);Wheelchair - manual;Shower seat;BSC/3in1;Grab bars - tub/shower      Prior Function Prior Level of Function : Needs assist       Physical Assist : Mobility (physical);ADLs (physical) Mobility (physical): Bed mobility;Transfers;Gait;Stairs   Mobility Comments: Household ambulator using RW ADLs Comments: was Independent prior to SNF admission, since assisted by SNF staff     Hand Dominance   Dominant Hand: Right    Extremity/Trunk Assessment   Upper Extremity Assessment Upper Extremity Assessment: Generalized weakness    Lower Extremity Assessment Lower Extremity Assessment: Generalized weakness    Cervical / Trunk Assessment Cervical / Trunk Assessment: Kyphotic  Communication   Communication: HOH  Cognition Arousal/Alertness: Awake/alert Behavior During Therapy: WFL for tasks assessed/performed Overall Cognitive Status: Within Functional Limits for tasks assessed                                          General Comments       Exercises     Assessment/Plan    PT Assessment Patient needs continued PT services  PT Problem List Decreased strength;Decreased activity tolerance;Decreased balance;Decreased mobility       PT Treatment Interventions DME instruction;Gait training;Stair training;Functional mobility training;Therapeutic activities;Therapeutic exercise;Patient/family education;Balance training    PT Goals (Current goals can be found in the Care Plan section)  Acute Rehab PT Goals Patient Stated Goal: return home after rehab PT Goal Formulation: With patient Time For Goal Achievement: 11/10/22 Potential to Achieve Goals: Fair    Frequency Min 3X/week     Co-evaluation               AM-PAC PT "6 Clicks" Mobility  Outcome Measure Help needed turning from your back to your side while in a flat bed without using bedrails?: A Little Help needed moving from lying on your back to sitting on the side of a flat bed without using bedrails?: A Lot Help needed moving to and from a bed to a chair (including a wheelchair)?: A Lot Help needed standing up from a chair using your arms (e.g., wheelchair or bedside chair)?: A Lot Help needed to walk in hospital room?: A Lot Help needed climbing 3-5 steps with a  railing? : Total 6 Click Score: 12    End of Session   Activity Tolerance: Patient tolerated treatment well;Patient limited by fatigue Patient left: in chair;with call bell/phone within reach Nurse Communication: Mobility status PT Visit Diagnosis: Unsteadiness on feet (R26.81);Other abnormalities of gait and mobility (R26.89);Muscle weakness (generalized) (M62.81)    Time: 4098-1191 PT Time Calculation (min) (ACUTE ONLY): 25 min   Charges:   PT Evaluation $PT Eval Moderate Complexity: 1 Mod PT Treatments $Therapeutic Activity: 23-37 mins        12:17 PM, 10/27/22 Ocie Bob, MPT Physical Therapist with [facility] [phone] office [phone] mobile  phone

## 2022-10-27 NOTE — Plan of Care (Signed)
  Problem: Acute Rehab PT Goals(only PT should resolve) Goal: Pt Will Go Supine/Side To Sit Outcome: Progressing Flowsheets (Taken 10/27/2022 1220) Pt will go Supine/Side to Sit: with minimal assist Goal: Patient Will Transfer Sit To/From Stand Outcome: Progressing Flowsheets (Taken 10/27/2022 1220) Patient will transfer sit to/from stand: with minimal assist Goal: Pt Will Transfer Bed To Chair/Chair To Bed Outcome: Progressing Flowsheets (Taken 10/27/2022 1220) Pt will Transfer Bed to Chair/Chair to Bed:  with min assist  with mod assist Goal: Pt Will Ambulate Outcome: Progressing Flowsheets (Taken 10/27/2022 1220) Pt will Ambulate:  15 feet  with minimal assist  with moderate assist  with rolling walker   12:20 PM, 10/27/22 Ocie Bob, MPT Physical Therapist with [facility] [phone] office [phone] mobile phone

## 2022-10-27 NOTE — TOC Progression Note (Signed)
Transition of Care [facility]) - Progression Note    Patient Details  Name: [name] MRN: [id number] Date of Birth: 1945/07/29  Transition of Care [facility]) CM/SW Contact  Catalina Gravel, [place] Phone Number: 10/27/2022, 12:05 PM  Clinical Narrative:    MD states pt ready for DC Monday.  Will be on Eliquis for 1 month. MD will be requesting PT evaluation, pending. Pt to return to [facility]. DC Monday.   Expected Discharge Plan: Skilled Nursing Facility Barriers to Discharge: Continued Medical Work up  Expected Discharge Plan and Services In-house Referral: Clinical Social Work   Post Acute Care Choice: Resumption of Svcs/PTA Provider Living arrangements for the past 2 months: Skilled Nursing Facility                                       Social Determinants of Health (SDOH) Interventions SDOH Screenings   Food Insecurity: No Food Insecurity (10/19/2022)  Housing: Low Risk  (10/19/2022)  Transportation Needs: No Transportation Needs (10/19/2022)  Utilities: Not At Risk (10/19/2022)  Alcohol Screen: Low Risk  (11/28/2021)  Depression (PHQ2-9): Low Risk  (05/06/2022)  Financial Resource Strain: Low Risk  (11/28/2021)  Physical Activity: Inactive (11/28/2021)  Social Connections: Socially Integrated (11/28/2021)  Stress: Stress Concern Present (11/28/2021)  Tobacco Use: Low Risk  (10/25/2022)    Readmission Risk Interventions    10/21/2022   10:12 AM 08/30/2022    3:36 PM  Readmission Risk Prevention Plan  Transportation Screening Complete Complete  PCP or Specialist Appt within 3-5 Days  Complete  Social Work Consult for Recovery Care Planning/Counseling  Complete  Palliative Care Screening  Not Applicable  Medication Review Oceanographer) Complete Complete  HRI or Home Care Consult Complete   SW Recovery Care/Counseling Consult Complete   Palliative Care Screening Not Applicable   Skilled Nursing Facility Complete

## 2022-10-27 NOTE — Progress Notes (Signed)
ANTICOAGULATION CONSULT NOTE - Follow Up Consult  Pharmacy Consult for heparin Indication:  LLE DVT  Allergies  Allergen Reactions   Celebrex [Celecoxib] Swelling   Keflet [Cephalexin] Swelling   Penicillins Hives and Swelling   Sulfa Antibiotics Swelling   Kenalog [Triamcinolone Acetonide]     unknown   Latex Other (See Comments)    No reaction listed on MAR   Lisinopril Other (See Comments)    unknown   Mobic [Meloxicam]     SWELLING   Penicillin G Other (See Comments)    No reaction listed on [facility]    Patient Measurements: Height: 5\' 2"  (157.5 cm) Weight: 70.5 kg (155 lb 6.8 oz) IBW/kg (Calculated) : 50.1 Heparin Dosing Weight: 70kg  Vital Signs: Temp: 98 F (36.7 C) (06/02 0542) Temp Source: Oral (06/02 0542) BP: 115/59 (06/02 0542) Pulse Rate: 54 (06/02 0542)  Labs: Recent Labs    10/25/22 0413 10/26/22 0150 10/26/22 1058 10/26/22 1959 10/27/22 0423  HGB 8.8* 8.3*  --   --  8.3*  HCT 27.4* 25.6*  --   --  26.7*  PLT 370 367  --   --  415*  HEPARINUNFRC 0.10* 0.17* 0.16* 0.40 0.39  CREATININE  --  1.96*  --   --   --      Estimated Creatinine Clearance: 22.5 mL/min (A) (by C-G formula based on SCr of 1.96 mg/dL (H)).   Medical History: Past Medical History:  Diagnosis Date   Allergy    Bradycardia    Bronchitis, chronic (HCC)    Chronic anxiety    Complication of anesthesia    hard to wake up   COPD (chronic obstructive pulmonary disease) (HCC)    Depression    Esophageal reflux    Headache(784.0)    Hyperlipidemia    Hypertension    Hypothyroidism    Obesity    Osteoporosis    Overactive bladder    PVC's (premature ventricular contractions)    Thyroid disease    Vitamin D deficiency disease     Assessment: 77 year old female admitted with general malaise and fever, recent cdiff infection on po vancomycin taper. Patient with new cellulitis and now dopplers positive for LLE dvt. Patient with ongoing anemia, hgb up to 8.5 this morning.  Heparin restarted after IVC filter placement on 5/31.  Heparin level therapeutic at 0.39  Goal of Therapy:  Heparin level 0.3-0.7 units/ml Monitor platelets by anticoagulation protocol: Yes   Plan:  Continue heparin 1600 units/h Heparin level daily  Judeth Cornfield, PharmD Clinical Pharmacist 10/27/2022 8:56 AM

## 2022-10-27 NOTE — Progress Notes (Signed)
Patient presented with pain through the night, PRN meds given and effective. Heparin drip continued, wound dressing change and tolerated well. Patient resting in bed at this time.

## 2022-10-28 DIAGNOSIS — I890 Lymphedema, not elsewhere classified: Secondary | ICD-10-CM | POA: Diagnosis not present

## 2022-10-28 DIAGNOSIS — M6281 Muscle weakness (generalized): Secondary | ICD-10-CM | POA: Diagnosis not present

## 2022-10-28 DIAGNOSIS — E039 Hypothyroidism, unspecified: Secondary | ICD-10-CM | POA: Diagnosis not present

## 2022-10-28 DIAGNOSIS — Z86718 Personal history of other venous thrombosis and embolism: Secondary | ICD-10-CM | POA: Diagnosis not present

## 2022-10-28 DIAGNOSIS — I509 Heart failure, unspecified: Secondary | ICD-10-CM | POA: Diagnosis not present

## 2022-10-28 DIAGNOSIS — N183 Chronic kidney disease, stage 3 unspecified: Secondary | ICD-10-CM | POA: Diagnosis not present

## 2022-10-28 DIAGNOSIS — L03311 Cellulitis of abdominal wall: Secondary | ICD-10-CM | POA: Diagnosis present

## 2022-10-28 DIAGNOSIS — E669 Obesity, unspecified: Secondary | ICD-10-CM | POA: Diagnosis present

## 2022-10-28 DIAGNOSIS — M7989 Other specified soft tissue disorders: Secondary | ICD-10-CM | POA: Diagnosis present

## 2022-10-28 DIAGNOSIS — Z90711 Acquired absence of uterus with remaining cervical stump: Secondary | ICD-10-CM | POA: Diagnosis not present

## 2022-10-28 DIAGNOSIS — Z7901 Long term (current) use of anticoagulants: Secondary | ICD-10-CM | POA: Diagnosis not present

## 2022-10-28 DIAGNOSIS — R531 Weakness: Secondary | ICD-10-CM | POA: Diagnosis not present

## 2022-10-28 DIAGNOSIS — L89322 Pressure ulcer of left buttock, stage 2: Secondary | ICD-10-CM | POA: Diagnosis not present

## 2022-10-28 DIAGNOSIS — F0150 Vascular dementia without behavioral disturbance: Secondary | ICD-10-CM | POA: Diagnosis not present

## 2022-10-28 DIAGNOSIS — N3000 Acute cystitis without hematuria: Secondary | ICD-10-CM | POA: Diagnosis not present

## 2022-10-28 DIAGNOSIS — J9601 Acute respiratory failure with hypoxia: Secondary | ICD-10-CM | POA: Diagnosis not present

## 2022-10-28 DIAGNOSIS — K922 Gastrointestinal hemorrhage, unspecified: Secondary | ICD-10-CM

## 2022-10-28 DIAGNOSIS — E785 Hyperlipidemia, unspecified: Secondary | ICD-10-CM | POA: Diagnosis present

## 2022-10-28 DIAGNOSIS — R0602 Shortness of breath: Secondary | ICD-10-CM | POA: Diagnosis not present

## 2022-10-28 DIAGNOSIS — J4489 Other specified chronic obstructive pulmonary disease: Secondary | ICD-10-CM | POA: Diagnosis present

## 2022-10-28 DIAGNOSIS — M170 Bilateral primary osteoarthritis of knee: Secondary | ICD-10-CM | POA: Diagnosis not present

## 2022-10-28 DIAGNOSIS — I2510 Atherosclerotic heart disease of native coronary artery without angina pectoris: Secondary | ICD-10-CM | POA: Diagnosis not present

## 2022-10-28 DIAGNOSIS — A0472 Enterocolitis due to Clostridium difficile, not specified as recurrent: Secondary | ICD-10-CM | POA: Diagnosis not present

## 2022-10-28 DIAGNOSIS — E559 Vitamin D deficiency, unspecified: Secondary | ICD-10-CM | POA: Diagnosis not present

## 2022-10-28 DIAGNOSIS — K219 Gastro-esophageal reflux disease without esophagitis: Secondary | ICD-10-CM | POA: Diagnosis not present

## 2022-10-28 DIAGNOSIS — I10 Essential (primary) hypertension: Secondary | ICD-10-CM | POA: Diagnosis not present

## 2022-10-28 DIAGNOSIS — L89153 Pressure ulcer of sacral region, stage 3: Secondary | ICD-10-CM | POA: Diagnosis present

## 2022-10-28 DIAGNOSIS — N1832 Chronic kidney disease, stage 3b: Secondary | ICD-10-CM | POA: Diagnosis not present

## 2022-10-28 DIAGNOSIS — J449 Chronic obstructive pulmonary disease, unspecified: Secondary | ICD-10-CM | POA: Diagnosis not present

## 2022-10-28 DIAGNOSIS — L89154 Pressure ulcer of sacral region, stage 4: Secondary | ICD-10-CM | POA: Diagnosis not present

## 2022-10-28 DIAGNOSIS — A419 Sepsis, unspecified organism: Secondary | ICD-10-CM | POA: Diagnosis not present

## 2022-10-28 DIAGNOSIS — J811 Chronic pulmonary edema: Secondary | ICD-10-CM | POA: Diagnosis not present

## 2022-10-28 DIAGNOSIS — F0394 Unspecified dementia, unspecified severity, with anxiety: Secondary | ICD-10-CM | POA: Diagnosis present

## 2022-10-28 DIAGNOSIS — I82402 Acute embolism and thrombosis of unspecified deep veins of left lower extremity: Secondary | ICD-10-CM | POA: Diagnosis not present

## 2022-10-28 DIAGNOSIS — I700 Atherosclerosis of aorta: Secondary | ICD-10-CM | POA: Diagnosis not present

## 2022-10-28 DIAGNOSIS — F32A Depression, unspecified: Secondary | ICD-10-CM | POA: Diagnosis present

## 2022-10-28 DIAGNOSIS — N179 Acute kidney failure, unspecified: Secondary | ICD-10-CM | POA: Diagnosis not present

## 2022-10-28 DIAGNOSIS — I959 Hypotension, unspecified: Secondary | ICD-10-CM | POA: Diagnosis not present

## 2022-10-28 DIAGNOSIS — E8809 Other disorders of plasma-protein metabolism, not elsewhere classified: Secondary | ICD-10-CM | POA: Diagnosis not present

## 2022-10-28 DIAGNOSIS — Z515 Encounter for palliative care: Secondary | ICD-10-CM | POA: Diagnosis not present

## 2022-10-28 DIAGNOSIS — R0989 Other specified symptoms and signs involving the circulatory and respiratory systems: Secondary | ICD-10-CM | POA: Diagnosis not present

## 2022-10-28 DIAGNOSIS — R609 Edema, unspecified: Secondary | ICD-10-CM | POA: Diagnosis not present

## 2022-10-28 DIAGNOSIS — M810 Age-related osteoporosis without current pathological fracture: Secondary | ICD-10-CM | POA: Diagnosis present

## 2022-10-28 DIAGNOSIS — E782 Mixed hyperlipidemia: Secondary | ICD-10-CM | POA: Diagnosis not present

## 2022-10-28 DIAGNOSIS — F0393 Unspecified dementia, unspecified severity, with mood disturbance: Secondary | ICD-10-CM | POA: Diagnosis present

## 2022-10-28 DIAGNOSIS — R52 Pain, unspecified: Secondary | ICD-10-CM | POA: Diagnosis not present

## 2022-10-28 DIAGNOSIS — Z981 Arthrodesis status: Secondary | ICD-10-CM | POA: Diagnosis not present

## 2022-10-28 DIAGNOSIS — I5033 Acute on chronic diastolic (congestive) heart failure: Secondary | ICD-10-CM | POA: Diagnosis present

## 2022-10-28 DIAGNOSIS — I872 Venous insufficiency (chronic) (peripheral): Secondary | ICD-10-CM | POA: Diagnosis not present

## 2022-10-28 DIAGNOSIS — E440 Moderate protein-calorie malnutrition: Secondary | ICD-10-CM | POA: Diagnosis not present

## 2022-10-28 DIAGNOSIS — R600 Localized edema: Secondary | ICD-10-CM | POA: Diagnosis not present

## 2022-10-28 DIAGNOSIS — N390 Urinary tract infection, site not specified: Secondary | ICD-10-CM | POA: Diagnosis not present

## 2022-10-28 DIAGNOSIS — N184 Chronic kidney disease, stage 4 (severe): Secondary | ICD-10-CM | POA: Diagnosis present

## 2022-10-28 DIAGNOSIS — Z7189 Other specified counseling: Secondary | ICD-10-CM | POA: Diagnosis not present

## 2022-10-28 DIAGNOSIS — Z833 Family history of diabetes mellitus: Secondary | ICD-10-CM | POA: Diagnosis not present

## 2022-10-28 DIAGNOSIS — Z0000 Encounter for general adult medical examination without abnormal findings: Secondary | ICD-10-CM | POA: Diagnosis not present

## 2022-10-28 DIAGNOSIS — L03115 Cellulitis of right lower limb: Secondary | ICD-10-CM | POA: Diagnosis not present

## 2022-10-28 DIAGNOSIS — Z7401 Bed confinement status: Secondary | ICD-10-CM | POA: Diagnosis not present

## 2022-10-28 DIAGNOSIS — Z1152 Encounter for screening for COVID-19: Secondary | ICD-10-CM | POA: Diagnosis not present

## 2022-10-28 DIAGNOSIS — I130 Hypertensive heart and chronic kidney disease with heart failure and stage 1 through stage 4 chronic kidney disease, or unspecified chronic kidney disease: Secondary | ICD-10-CM | POA: Diagnosis not present

## 2022-10-28 DIAGNOSIS — D631 Anemia in chronic kidney disease: Secondary | ICD-10-CM | POA: Diagnosis present

## 2022-10-28 DIAGNOSIS — F419 Anxiety disorder, unspecified: Secondary | ICD-10-CM | POA: Diagnosis not present

## 2022-10-28 DIAGNOSIS — Z79899 Other long term (current) drug therapy: Secondary | ICD-10-CM | POA: Diagnosis not present

## 2022-10-28 DIAGNOSIS — I5032 Chronic diastolic (congestive) heart failure: Secondary | ICD-10-CM | POA: Diagnosis not present

## 2022-10-28 DIAGNOSIS — I129 Hypertensive chronic kidney disease with stage 1 through stage 4 chronic kidney disease, or unspecified chronic kidney disease: Secondary | ICD-10-CM | POA: Diagnosis not present

## 2022-10-28 DIAGNOSIS — F03918 Unspecified dementia, unspecified severity, with other behavioral disturbance: Secondary | ICD-10-CM | POA: Diagnosis not present

## 2022-10-28 DIAGNOSIS — I480 Paroxysmal atrial fibrillation: Secondary | ICD-10-CM | POA: Diagnosis not present

## 2022-10-28 DIAGNOSIS — F411 Generalized anxiety disorder: Secondary | ICD-10-CM | POA: Diagnosis not present

## 2022-10-28 DIAGNOSIS — E538 Deficiency of other specified B group vitamins: Secondary | ICD-10-CM | POA: Diagnosis present

## 2022-10-28 DIAGNOSIS — R509 Fever, unspecified: Secondary | ICD-10-CM | POA: Diagnosis not present

## 2022-10-28 DIAGNOSIS — D649 Anemia, unspecified: Secondary | ICD-10-CM | POA: Diagnosis not present

## 2022-10-28 DIAGNOSIS — I517 Cardiomegaly: Secondary | ICD-10-CM | POA: Diagnosis not present

## 2022-10-28 DIAGNOSIS — M79606 Pain in leg, unspecified: Secondary | ICD-10-CM | POA: Diagnosis not present

## 2022-10-28 DIAGNOSIS — S31000D Unspecified open wound of lower back and pelvis without penetration into retroperitoneum, subsequent encounter: Secondary | ICD-10-CM | POA: Diagnosis not present

## 2022-10-28 DIAGNOSIS — Z91040 Latex allergy status: Secondary | ICD-10-CM | POA: Diagnosis not present

## 2022-10-28 DIAGNOSIS — I824Y2 Acute embolism and thrombosis of unspecified deep veins of left proximal lower extremity: Secondary | ICD-10-CM | POA: Diagnosis not present

## 2022-10-28 LAB — GLUCOSE, CAPILLARY
Glucose-Capillary: 79 mg/dL (ref 70–99)
Glucose-Capillary: 64 mg/dL — ABNORMAL LOW (ref 70–99)
Glucose-Capillary: 79 mg/dL (ref 70–99)
Glucose-Capillary: 87 mg/dL (ref 70–99)
Glucose-Capillary: 92 mg/dL (ref 70–99)

## 2022-10-28 LAB — CBC
HCT: 28.8 % — ABNORMAL LOW (ref 36.0–46.0)
Hemoglobin: 8.9 g/dL — ABNORMAL LOW (ref 12.0–15.0)
MCH: 29.7 pg (ref 26.0–34.0)
MCHC: 30.9 g/dL (ref 30.0–36.0)
MCV: 96 fL (ref 80.0–100.0)
Platelets: 452 10*3/uL — ABNORMAL HIGH (ref 150–400)
RBC: 3 MIL/uL — ABNORMAL LOW (ref 3.87–5.11)
RDW: 15.2 % (ref 11.5–15.5)
WBC: 12.8 10*3/uL — ABNORMAL HIGH (ref 4.0–10.5)
nRBC: 0 % (ref 0.0–0.2)

## 2022-10-28 LAB — BASIC METABOLIC PANEL
Anion gap: 7 (ref 5–15)
BUN: 45 mg/dL — ABNORMAL HIGH (ref 8–23)
CO2: 26 mmol/L (ref 22–32)
Calcium: 7.7 mg/dL — ABNORMAL LOW (ref 8.9–10.3)
Chloride: 101 mmol/L (ref 98–111)
Creatinine, Ser: 1.96 mg/dL — ABNORMAL HIGH (ref 0.44–1.00)
GFR, Estimated: 26 mL/min — ABNORMAL LOW (ref >60)
Glucose, Bld: 81 mg/dL (ref 70–99)
Potassium: 4.5 mmol/L (ref 3.5–5.1)
Sodium: 134 mmol/L — ABNORMAL LOW (ref 135–145)

## 2022-10-28 LAB — HEPARIN LEVEL (UNFRACTIONATED): Heparin Unfractionated: 0.46 IU/mL (ref 0.30–0.70)

## 2022-10-28 MED ORDER — VANCOMYCIN HCL 125 MG PO CAPS: 125.0000 mg | ORAL_CAPSULE | ORAL | 0 refills | Status: DC

## 2022-10-28 MED ORDER — OXYCODONE HCL 5 MG PO TABA: 5.0000 mg | ORAL_TABLET | Freq: Every day | ORAL | 0 refills | Status: DC | PRN

## 2022-10-28 MED ORDER — LINEZOLID 600 MG PO TABS: 600.0000 mg | ORAL_TABLET | Freq: Two times a day (BID) | ORAL | 0 refills | Status: AC

## 2022-10-28 MED ORDER — FUROSEMIDE 20 MG PO TABS: 20.0000 mg | ORAL_TABLET | Freq: Every day | ORAL | 0 refills | Status: DC

## 2022-10-28 MED ORDER — APIXABAN 5 MG PO TABS
10.0000 mg | ORAL_TABLET | Freq: Two times a day (BID) | ORAL | Status: DC
  Administered 2022-10-28: 10 mg via ORAL
  Filled 2022-10-28: qty 2

## 2022-10-28 MED ORDER — MIRTAZAPINE 7.5 MG PO TABS: 7.5000 mg | ORAL_TABLET | Freq: Every day | ORAL | 1 refills | Status: AC

## 2022-10-28 MED ORDER — APIXABAN 5 MG PO TABS: 5.0000 mg | ORAL_TABLET | Freq: Two times a day (BID) | ORAL | Status: DC

## 2022-10-28 MED ORDER — ELIQUIS DVT/PE STARTER PACK 5 MG PO TBPK: ORAL_TABLET | ORAL | 0 refills | Status: DC

## 2022-10-28 NOTE — Progress Notes (Addendum)
ANTICOAGULATION CONSULT NOTE - Follow Up Consult  Pharmacy Consult for heparin >> apixaban Indication:  LLE DVT  Allergies  Allergen Reactions   Celebrex [Celecoxib] Swelling   Keflet [Cephalexin] Swelling   Penicillins Hives and Swelling   Sulfa Antibiotics Swelling   Kenalog [Triamcinolone Acetonide]     unknown   Latex Other (See Comments)    No reaction listed on MAR   Lisinopril Other (See Comments)    unknown   Mobic [Meloxicam]     SWELLING   Penicillin G Other (See Comments)    No reaction listed on [facility]    Patient Measurements: Height: 5\' 2"  (157.5 cm) Weight: 70.5 kg (155 lb 6.8 oz) IBW/kg (Calculated) : 50.1 Heparin Dosing Weight: 70kg  Vital Signs: Temp: 98.7 F (37.1 C) (06/02 2040) BP: 115/64 (06/02 2040) Pulse Rate: 71 (06/02 2040)  Labs: Recent Labs    10/26/22 0150 10/26/22 1058 10/26/22 1959 10/27/22 0423 10/28/22 0410  HGB 8.3*  --   --  8.3* 8.9*  HCT 25.6*  --   --  26.7* 28.8*  PLT 367  --   --  415* 452*  HEPARINUNFRC 0.17*   < > 0.40 0.39 0.46  CREATININE 1.96*  --   --   --  1.96*   < > = values in this interval not displayed.     Estimated Creatinine Clearance: 22.5 mL/min (A) (by C-G formula based on SCr of 1.96 mg/dL (H)).   Medical History: Past Medical History:  Diagnosis Date   Allergy    Bradycardia    Bronchitis, chronic (HCC)    Chronic anxiety    Complication of anesthesia    hard to wake up   COPD (chronic obstructive pulmonary disease) (HCC)    Depression    Esophageal reflux    Headache(784.0)    Hyperlipidemia    Hypertension    Hypothyroidism    Obesity    Osteoporosis    Overactive bladder    PVC's (premature ventricular contractions)    Thyroid disease    Vitamin D deficiency disease     Assessment: 77 year old female admitted with general malaise and fever, recent cdiff infection on po vancomycin taper. Patient with new cellulitis and now dopplers positive for LLE dvt. Patient with ongoing  anemia, hgb up to 8.5 this morning. Heparin restarted after IVC filter placement on 5/31.  Heparin level therapeutic at 0.46  Goal of Therapy:  Heparin level 0.3-0.7 units/ml Monitor platelets by anticoagulation protocol: Yes   Plan:  Stop heparin infusion Start apixaban 10 mg twice daily x 7 days followed by apixaban 5 mg twice daily.   Judeth Cornfield, PharmD Clinical Pharmacist 10/28/2022 8:00 AM

## 2022-10-28 NOTE — Progress Notes (Addendum)
Patient's daughter "Shawna Orleans" has called patient 30+ times on patient's personal cell phone and continues to call. Daughter is very loud and aggressive on the phone to patient, patient tearfully verbalized she wished she could get some rest.

## 2022-10-28 NOTE — Discharge Summary (Addendum)
[name], is a 77 y.o. female  DOB 1946-02-11  MRN [id number].  Admission date:  10/19/2022  Admitting Physician  Lilyan Gilford, DO  Discharge Date:  10/28/2022   Primary MD  [name], FNP  Recommendations for primary care physician for things to follow:   1)Avoid ibuprofen/Advil/Aleve/Motrin/Goody Powders/Naproxen/BC powders/Meloxicam/Diclofenac/Indomethacin and other Nonsteroidal anti-inflammatory medications as these will make you more likely to bleed and can cause stomach ulcers, can also cause Kidney problems.   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 Fill defect with calcium alginate [place] # [phone]), using cotton tipped applicator to fill undermined areas. Top with dry gauze, cover with silicone foam. Turn side to side  Admission Diagnosis  AKI (acute kidney injury) (HCC) [N17.9] Urinary tract infection without hematuria, site unspecified [N39.0] Gastrointestinal hemorrhage, unspecified gastrointestinal hemorrhage type [K92.2] Anemia, unspecified type [D64.9]   Discharge Diagnosis  AKI (acute kidney injury) (HCC) [N17.9] Urinary tract infection without hematuria, site unspecified [N39.0] Gastrointestinal hemorrhage, unspecified gastrointestinal hemorrhage type [K92.2] Anemia, unspecified type [D64.9]   Principal Problem:   Left Leg Extensive DVT (deep venous thrombosis) (HCC) Active Problems:   Dementia with behavioral disturbance (HCC)   Depression   Anxiety   Anemia   Decubitus ulcer   AKI (acute kidney injury) (HCC)   Thyroid disease    C. difficile colitis   Pulmonary vascular congestion   UTI (urinary tract infection)      Past Medical History:  Diagnosis Date   Allergy    Bradycardia    Bronchitis, chronic (HCC)    Chronic anxiety    Complication of anesthesia    hard to wake up   COPD (chronic obstructive pulmonary disease) (HCC)    Depression    Esophageal reflux    Headache(784.0)    Hyperlipidemia    Hypertension    Hypothyroidism    Obesity    Osteoporosis    Overactive bladder    PVC's (premature ventricular contractions)    Thyroid disease    Vitamin D deficiency disease     Past Surgical History:  Procedure Laterality Date   ABDOMINAL HYSTERECTOMY     CENTRAL LINE INSERTION Right 01/10/2022   Procedure: CENTRAL LINE INSERTION;  Surgeon: Lucretia Roers, MD;  Location: [facility];  Service: General;  Laterality: Right;   CHOLECYSTECTOMY N/A 11/04/2013   Procedure: LAPAROSCOPIC CHOLECYSTECTOMY WITH INTRAOPERATIVE CHOLANGIOGRAM;  Surgeon: Wilmon Arms. Corliss Skains, MD;  Location: [facility];  Service: General;  Laterality: N/A;   COLONOSCOPY     FRACTURE SURGERY     pins removed from Hip surgery   HIP FRACTURE SURGERY  1990    pins removed in 1991  IR FLUORO GUIDE CV LINE LEFT  01/31/2022   IR IVC FILTER PLMT / S&I /IMG GUID/MOD SED  10/25/2022   IR US GUIDE VASC ACCESS LEFT  01/31/2022   LAPAROTOMY N/A 01/10/2022   Procedure: EXPLORATORY LAPAROTOMY,;  Surgeon: Lucretia Roers, MD;  Location: [facility];  Service: General;  Laterality: N/A;   LYSIS OF ADHESION N/A 01/10/2022   Procedure: LYSIS OF ADHESION;  Surgeon: Lucretia Roers, MD;  Location: [facility];  Service: General;  Laterality: N/A;   PARTIAL HYSTERECTOMY  1987   SPINAL FUSION     THORACENTESIS N/A 01/18/2022   Procedure: THORACENTESIS;  Surgeon: Lorin Glass, MD;  Location[facility];  Service: Pulmonary;  Laterality: N/A;   UPPER GASTROINTESTINAL ENDOSCOPY      HPI  from the history and physical done on the day of admission:   HPI: [name] is a 77 y.o. female with medical history significant of anxiety, depression, COPD, GERD, hyperlipidemia, hypertension, hypothyroidism, decubitus ulcer, and more presents the ED with a chief complaint of fever and weakness.  It is reported that patient had a fever of 101 at 1030 and was given acetaminophen.  Fever was then 102 at 1400.  She was given another 500 mg of acetaminophen and transferred to the hospital.  Patient does have a history of C. difficile and has been having diarrhea for the last 2 days.  She is currently on vancomycin p.o. patient is unsure why she is here in the hospital.  She thinks it is because of her decubitus ulcer.  That was the answer that was given after a lot of reorientation.  At first when asked why she is in the hospital she answered "I am concerned about that too."  Patient is resting comfortably at the time of my exam.  She is somnolent.  She has concentrated urine in the pure wick canister.  Patient has no other complaints at this time.   Patient does have advanced directive grinding healthcare agent to make decisions for her.  She does not have a DNR on file.   Review of Systems: unable to review all systems due to the inability of the patient to answer questions.   Hospital Course:   -Brief summary Patient admitted from the skilled nursing facility secondary to general malaise and fever.  Recent admission approximately 2 weeks ago secondary to C. difficile infection.  Patient actively taking vancomycin tapering.  Still experiencing diarrhea.  Workup during this admission suggesting the presence of UTI.  Admitted for IV antibiotics and further evaluation/management. -Found to have left lower extremity DVT, had IVC filter placed on 10/25/2022, awaiting transition from IV heparin to DOAC if no bleeding and then discharged home   Assessment and Plan: 1)Left Lower Extremity DVT--- discussed findings with reading radiologist Dr. Karle Starch -No DVT on the right despite  clinical findings of erythema swelling of the right leg , surprisingly patient actually has an acute DVT as noted is on the left-even though left lower extremity exam is unremarkable -Given patient's anemia and bleeding concerns get IR consult for possible IVC filter placement -Discussed with Dr. Miles Costain (IR) and patient's brother [name] (his wife [name]) and pt's son [name]---  --Status post IVC filter placement on 10/25/2022 -Treated with IV heparin since 10/24/2022, transitioned to Eliquis on 10/28/2022 --she will need full anticoagulation for at least a month or so to prevent premature IVC filter failure---discharge on Eliquis Started pack, plan is to discontinue Eliquis around July 3rd,  2024 -Hgb stable above 8, no bleeding concerns at this time -Repeat CBC on 11/04/2022   2)Klebsiella UTI (urinary tract infection) --Multiple antibiotic allergies -Ongoing C. difficile infection -ID pharmacist recommends stopping Cipro and using the linezolid for right leg cellulitis and Klebsiella UTI   3)Right Leg Cellulitis --with significant swelling and erythema -Please see photos in epic -Much improved on Zyvox -Discharge on additional 3 days of Zyvox---   4)Dementia with behavioral disturbance (HCC) -Continue supportive care and considering tension -Mood currently stable -Continue the use of Celexa -Palliative care consultation requested.   5)HFpEF/chronic diastolic dysfunction CHF  -echo with EF of 65 to 70% and grade 1 diastolic dysfunction  -Lasix as ordered- -Patient reports no shortness of breath and there is no oxygen requirement at the moment. -Repeat BMP on 11/04/2022   6)C. difficile colitis -Continue p.o. vancomycin taper protocol; -Take oral Vancomycin  125 mg-- 1 capsule twice daily for 7 days, then 1 capsule once daily for 7 days, then 1 capsule every other day for 7 days, then 1 capsule every 3 days for 7 days then Stop---Last dose is 11/25/22   Thyroid  disease -Continue Synthroid   AKI (acute kidney injury) (HCC) - Most likely due to dehydration/GI losses - After fluid resuscitation and supportive care creatinine essentially back to baseline -Continue to minimize nephrotoxic agents as much as possible, avoid the use of contrast and prevent hypotension. -Creatinine currently stable around 2 -Repeat BMP on November 04, 2022   Decubitus ulcer----actually appears to be improving -Wound VAC removed at SNF per report -Sacral wound---Wound care to left sacral stage 3 pressure injury:  Cleanse with NS, pat dry. Fill defect with calcium alginate [place] # [phone]), using cotton tipped applicator to fill undermined areas. Top with dry gauze, cover with silicone foam. Turn side to side -Continue constant repositioning, wound care and preventive barriers.   Chronic anemia - Anemia of chronic disease as prior anemia panel evaluation -Fecal occult blood test was positive; most likely in the setting of acute C. difficile colitis. -Current hemoglobin > 8 -Appreciate assistance and recommendation by gastroenterology service; for now no endoscopic evaluation acutely will be pursued. -Monitor Hgb closely with initiation of full anticoagulation   Anxiety - Continue as needed Atarax -Patient also chronically on Celexa. -Continue supportive care and constant reorientation.   Depression - Stable mood; no active hallucination -Continue the use of Celexa and trazodone.    B12 deficiency anemia -B12 152 - continue supplementation (intramuscular injection daily x 7 days; then weekly x 4; then monthly). -Repeat B12 level in 4-6 months.  Social/Ethics----I called and left Voicemail for son/HCPOA---Justin Simms at [phone] and I also called and left message for her Brother [name]---[phone] -Patient remains a full code   Disposition: The patient is from: SNF              Anticipated d/c is to: SNF  Discharge Condition: stable  Follow UP    Contact information for follow-up providers     [name], FNP. Schedule an appointment as soon as possible for a visit in 1 week(s).   Specialty: Family Medicine Why: Repeat CBC and BMP--- Contact information: [address] [place] [address] [phone]              Contact information for after-discharge care     Destination     [facility] .   Service: Skilled Nursing Contact information: [address] [place] [address] [phone]  Consults obtained - IR/ID  Diet and Activity recommendation:  As advised  Discharge Instructions    Discharge Instructions     Call MD for:  difficulty breathing, headache or visual disturbances   Complete by: As directed    Call MD for:  persistant dizziness or light-headedness   Complete by: As directed    Call MD for:  persistant nausea and vomiting   Complete by: As directed    Call MD for:  temperature >100.4   Complete by: As directed    Diet - low sodium heart healthy   Complete by: As directed    Discharge instructions   Complete by: As directed    1)Avoid ibuprofen/Advil/Aleve/Motrin/Goody Powders/Naproxen/BC powders/Meloxicam/Diclofenac/Indomethacin and other Nonsteroidal anti-inflammatory medications as these will make you more likely to bleed and can cause stomach ulcers, can also cause Kidney problems.   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 Fill defect with calcium alginate [place] # [phone]), using cotton tipped applicator to fill undermined areas. Top with dry gauze, cover with silicone foam. Turn side to  side   Discharge wound care:   Complete by: As directed    -Sacral wound---Wound care to left sacral stage 3 pressure injury:  Cleanse with NS, pat dry. Fill defect with calcium alginate [place] # [phone]), using cotton tipped applicator to fill undermined areas. Top with dry gauze, cover with silicone foam. Turn side to side   Increase activity slowly   Complete by: As directed        Discharge Medications     Allergies as of 10/28/2022       Reactions   Celebrex [celecoxib] Swelling   Keflet [cephalexin] Swelling   Penicillins Hives, Swelling   Sulfa Antibiotics Swelling   Kenalog [triamcinolone Acetonide]    unknown   Latex Other (See Comments)   No reaction listed on MAR   Lisinopril Other (See Comments)   unknown   Mobic [meloxicam]    SWELLING   Penicillin G Other (See Comments)   No reaction listed on MAR        Medication List     STOP taking these medications    megestrol 400 MG/10ML suspension Commonly known as: MEGACE   traMADol 50 MG tablet Commonly known as: Ultram   traZODone 50 MG tablet Commonly known as: DESYREL       TAKE these medications    acetaminophen 325 MG tablet Commonly known as: TYLENOL Take 2 tablets (650 mg total) by mouth every 6 (six) hours as needed for mild pain (or Fever >/= 101).   ascorbic acid 500 MG tablet Commonly known as: VITAMIN C Take 500 mg by mouth 2 (two) times daily.   citalopram 40 MG tablet Commonly known as: CeleXA Take 1 tablet (40 mg total) by mouth daily.   Eliquis DVT/PE Starter Pack Generic drug: Apixaban Starter Pack (10mg  and 5mg ) Take as directed on package: start with two-5mg  tablets twice daily for 7 days. On day 8, switch to one-5mg  tablet twice daily.   ferrous sulfate 325 (65 FE) MG tablet Take 1 tablet (325 mg total) by mouth daily with breakfast.  furosemide 20 MG tablet Commonly known as: LASIX Take 1 tablet (20 mg total) by mouth daily. Start taking on: October 29, 2022   glucose  4 GM chewable tablet Chew 1 tablet (4 g total) by mouth as needed for low blood sugar.   levothyroxine 75 MCG tablet Commonly known as: SYNTHROID Take 1 tablet (75 mcg total) by mouth daily.   linezolid 600 MG tablet Commonly known as: Zyvox Take 1 tablet (600 mg total) by mouth 2 (two) times daily for 3 days.   Magnesium Oxide 400 MG Caps Take 1 capsule (400 mg total) by mouth daily.   mirtazapine 7.5 MG tablet Commonly known as: REMERON Take 1 tablet (7.5 mg total) by mouth at bedtime. For appetite stimulation   multivitamin tablet Take 1 tablet by mouth every morning.   nutrition supplement (JUVEN) Pack Take 1 packet by mouth 2 (two) times daily between meals.   feeding supplement (ENSURE COMPLETE) Liqd Take 237 mLs by mouth 2 (two) times daily between meals.   nystatin powder Commonly known as: MYCOSTATIN/NYSTOP Apply topically 3 (three) times daily. On groins   OxyCODONE HCl (Abuse Deter) 5 MG Taba Commonly known as: OXAYDO Take 5 mg by mouth daily as needed (severe pain).   Pro-Stat Liqd Take 30 mLs by mouth 2 (two) times daily.   rOPINIRole 2 MG 24 hr tablet Commonly known as: REQUIP XL Take 2 mg by mouth at bedtime.   sodium bicarbonate 650 MG tablet Take 1 tablet (650 mg total) by mouth 3 (three) times daily.   vancomycin 125 MG capsule Commonly known as: VANCOCIN Take 1 capsule (125 mg total) by mouth See admin instructions. Take Vancomycin  125 mg-- 1 capsule twice daily for 7 days, then 1 capsule once daily for 7 days, then 1 capsule every other day for 7 days, then 1 capsule every 3 days for 7 days then Stop---Last dose is 11/25/22 What changed: additional instructions   vitamin D3 50 MCG (2000 UT) Caps Take 2,000 Units by mouth every morning.   Zinc 220 (50 Zn) MG Caps Take 220 mg by mouth every morning.               Discharge Care Instructions  (From admission, onward)           Start     Ordered   10/28/22 0000  Discharge wound  care:       Comments: -Sacral wound---Wound care to left sacral stage 3 pressure injury:  Cleanse with NS, pat dry. Fill defect with calcium alginate [place] # [phone]), using cotton tipped applicator to fill undermined areas. Top with dry gauze, cover with silicone foam. Turn side to side   10/28/22 1102            Major procedures and Radiology Reports - PLEASE review detailed and final reports for all details, in brief -   IR IVC FILTER PLMT / S&I Lenise Arena GUID/MOD SED  Result Date: 10/25/2022 INDICATION: Extensive multiple comorbidities including sacral decubitus ulcers, sedentary patient, chronic anemia, acute lower extremity DVT EXAM: ULTRASOUND GUIDANCE FOR VASCULAR ACCESS IVC CATHETERIZATION AND VENOGRAM IVC FILTER INSERTION Date:  10/25/2022 10/25/2022 1:13 pm Radiologist:  Judie Petit. Ruel Favors, MD Guidance:  Ultrasound and fluoroscopic CONTRAST:  CO2 utilized because of an elevated creatinine MEDICATIONS: 1% lidocaine low ANESTHESIA/SEDATION: Moderate Sedation Time: None. The patient was continuously monitored during the procedure by the interventional radiology nurse under my direct supervision. FLUOROSCOPY: Fluoroscopy Time: 1 minutes 6 seconds (37 mGy). COMPLICATIONS:  None immediate. PROCEDURE: Informed consent was obtained from the patient following explanation of the procedure, risks, benefits and alternatives. The patient understands, agrees and consents for the procedure. All questions were addressed. A time out was performed. Maximal barrier sterile technique utilized including caps, mask, sterile gowns, sterile gloves, large sterile drape, hand hygiene, and betadine prep. Under sterile condition and local anesthesia, right internal jugular venous access was performed with ultrasound. Images obtained for documentation of the patent right internal jugular vein. Over a guide wire, the IVC filter delivery sheath and inner dilator were advanced into the IVC just above the IVC bifurcation. Contrast  injection was performed for an IVC venogram. IVC VENOGRAM: The IVC is patent. No evidence of thrombus, stenosis, or occlusion. No variant venous anatomy. The renal veins are identified at L1-2. IVC FILTER INSERTION: Through the delivery sheath, the Bard Denali IVC filter was deployed in the infrarenal IVC at the L3 level just below the renal veins and above the IVC bifurcation. Contrast injection confirmed position. There is good apposition of the filter against the IVC. The delivery sheath was removed and hemostasis was obtained with compression for 5 minutes. The patient tolerated the procedure well. No immediate complications. IMPRESSION: Ultrasound and fluoroscopically guided infrarenal IVC filter insertion. PLAN: Due to patient related comorbidities and/or clinical necessity, this IVC filter should be considered a permanent device. This patient will not be actively followed for future filter retrieval. Electronically Signed   By: Judie Petit.  Shick M.D.   On: 10/25/2022 13:45   US Venous Img Lower Bilateral (DVT)  Result Date: 10/23/2022 CLINICAL DATA:  Bilateral leg swelling EXAM: BILATERAL LOWER EXTREMITY VENOUS DOPPLER ULTRASOUND TECHNIQUE: Gray-scale sonography with graded compression, as well as color Doppler and duplex ultrasound were performed to evaluate the lower extremity deep venous systems from the level of the common femoral vein and including the common femoral, femoral, profunda femoral, popliteal and calf veins including the posterior tibial, peroneal and gastrocnemius veins when visible. The superficial great saphenous vein was also interrogated. Spectral Doppler was utilized to evaluate flow at rest and with distal augmentation maneuvers in the common femoral, femoral and popliteal veins. COMPARISON:  None Available. FINDINGS: RIGHT LOWER EXTREMITY Common Femoral Vein: No evidence of thrombus. Normal compressibility, respiratory phasicity and response to augmentation. Saphenofemoral Junction: No  evidence of thrombus. Normal compressibility and flow on color Doppler imaging. Profunda Femoral Vein: No evidence of thrombus. Normal compressibility and flow on color Doppler imaging. Femoral Vein: No evidence of thrombus. Normal compressibility, respiratory phasicity and response to augmentation. Popliteal Vein: No evidence of thrombus. Normal compressibility, respiratory phasicity and response to augmentation. Calf Veins: Calf veins are not well visualized. Superficial Great Saphenous Vein: No evidence of thrombus. Normal compressibility. Venous Reflux:  None. Other Findings:  None. LEFT LOWER EXTREMITY Common Femoral Vein: No evidence of thrombus. Normal compressibility, respiratory phasicity and response to augmentation. Saphenofemoral Junction: No evidence of thrombus. Normal compressibility and flow on color Doppler imaging. Profunda Femoral Vein: No evidence of thrombus. Normal compressibility and flow on color Doppler imaging. Femoral Vein: Occlusive thrombus is noted within the peripheral aspect of the superficial femoral vein with decreased compressibility. Popliteal Vein: Thrombus is noted within the popliteal vein with decreased compressibility. Calf Veins: Thrombus is noted within the posterior tibial and peroneal veins with decreased compressibility. Superficial Great Saphenous Vein: No evidence of thrombus. Normal compressibility. Venous Reflux:  None. Other Findings:  None. IMPRESSION: Deep venous thrombosis on the left involving the calf veins, popliteal vein and peripheral  aspect of the superficial femoral vein. No definitive deep venous thrombosis on the right. Electronically Signed   By: Alcide Clever M.D.   On: 10/23/2022 22:45   ECHOCARDIOGRAM COMPLETE  Result Date: 10/21/2022    ECHOCARDIOGRAM REPORT   Patient Name:   JAISA Eslick Date of Exam: 10/21/2022 Medical Rec #:  [id number]    Height:       62.0 in Accession #:    [id number]   Weight:       155.4 lb Date of Birth:  Jul 13, 1945     BSA:           1.717 m Patient Age:    77 years     BP:           106/68 mmHg Patient Gender: F            HR:           80 bpm. [facility] Procedure: 2D Echo, Color Doppler and Cardiac Doppler Indications:    I50.9* Heart failure (unspecified)  History:        Patient has prior history of Echocardiogram examinations, most                 recent 05/21/2022. COPD; Risk Factors:Hypertension and                 Dyslipidemia.  Sonographer:    Irving Burton Senior RDCS Referring Phys: [id number] ASIA B ZIERLE-GHOSH IMPRESSIONS  1. Left ventricular ejection fraction, by estimation, is 65 to 70%. The left ventricle has normal function. The left ventricle has no regional wall motion abnormalities. Left ventricular diastolic parameters are consistent with Grade I diastolic dysfunction (impaired relaxation).  2. Right ventricular systolic function is normal. The right ventricular size is normal. There is mildly elevated pulmonary artery systolic pressure. The estimated right ventricular systolic pressure is 40.0 mmHg.  3. Left atrial size was moderately dilated.  4. The mitral valve is normal in structure. Trivial mitral valve regurgitation.  5. The aortic valve is tricuspid. Aortic valve regurgitation is trivial. Aortic valve sclerosis/calcification is present, without any evidence of aortic stenosis.  6. The inferior vena cava is dilated in size with <50% respiratory variability, suggesting right atrial pressure of 15 mmHg.  7. Cannot exclude a small PFO. Comparison(s): Changes from prior study are noted. 05/21/2022: LVEF 60-65%, normal diastolic function. FINDINGS  Left Ventricle: Left ventricular ejection fraction, by estimation, is 65 to 70%. The left ventricle has normal function. The left ventricle has no regional wall motion abnormalities. The left ventricular internal cavity size was normal in size. There is  no left ventricular hypertrophy. Left ventricular diastolic parameters are consistent with Grade I diastolic  dysfunction (impaired relaxation). Indeterminate filling pressures. Right Ventricle: The right ventricular size is normal. No increase in right ventricular wall thickness. Right ventricular systolic function is normal. There is mildly elevated pulmonary artery systolic pressure. The tricuspid regurgitant velocity is 2.50  m/s, and with an assumed right atrial pressure of 15 mmHg, the estimated right ventricular systolic pressure is 40.0 mmHg. Left Atrium: Left atrial size was moderately dilated. Right Atrium: Right atrial size was normal in size. Pericardium: Trivial pericardial effusion is present. Mitral Valve: The mitral valve is normal in structure. Trivial mitral valve regurgitation. Tricuspid Valve: The tricuspid valve is normal in structure. Tricuspid valve regurgitation is mild. Aortic Valve: The aortic valve is tricuspid. Aortic valve regurgitation is trivial. Aortic valve sclerosis/calcification is present, without any evidence of aortic stenosis.  Aortic valve mean gradient measures 7.0 mmHg. Aortic valve peak gradient measures 14.3 mmHg. Aortic valve area, by VTI measures 1.96 cm. Pulmonic Valve: The pulmonic valve was normal in structure. Pulmonic valve regurgitation is not visualized. Aorta: The aortic root and ascending aorta are structurally normal, with no evidence of dilitation. Venous: The inferior vena cava is dilated in size with less than 50% respiratory variability, suggesting right atrial pressure of 15 mmHg. IAS/Shunts: Cannot exclude a small PFO.  LEFT VENTRICLE PLAX 2D LVIDd:         4.20 cm   Diastology LVIDs:         2.60 cm   LV e' medial:    6.85 cm/s LV PW:         0.90 cm   LV E/e' medial:  11.6 LV IVS:        0.80 cm   LV e' lateral:   6.85 cm/s LVOT diam:     1.80 cm   LV E/e' lateral: 11.6 LV SV:         65 LV SV Index:   38 LVOT Area:     2.54 cm  RIGHT VENTRICLE RV S prime:     15.60 cm/s TAPSE (M-mode): 2.3 cm LEFT ATRIUM             Index        RIGHT ATRIUM           Index  LA diam:        5.30 cm 3.09 cm/m   RA Area:     16.50 cm LA Vol (A2C):   71.6 ml 41.69 ml/m  RA Volume:   39.40 ml  22.94 ml/m LA Vol (A4C):   75.2 ml 43.79 ml/m LA Biplane Vol: 74.3 ml 43.26 ml/m  AORTIC VALVE AV Area (Vmax):    1.83 cm AV Area (Vmean):   1.93 cm AV Area (VTI):     1.96 cm AV Vmax:           189.00 cm/s AV Vmean:          124.000 cm/s AV VTI:            0.333 m AV Peak Grad:      14.3 mmHg AV Mean Grad:      7.0 mmHg LVOT Vmax:         136.00 cm/s LVOT Vmean:        94.200 cm/s LVOT VTI:          0.257 m LVOT/AV VTI ratio: 0.77  AORTA Ao Root diam: 2.60 cm Ao Asc diam:  3.30 cm MITRAL VALVE               TRICUSPID VALVE MV Area (PHT): 2.88 cm    TR Peak grad:   25.0 mmHg MV Decel Time: 263 msec    TR Vmax:        250.00 cm/s MV E velocity: 79.70 cm/s MV A velocity: 66.00 cm/s  SHUNTS MV E/A ratio:  1.21        Systemic VTI:  0.26 m                            Systemic Diam: 1.80 cm Zoila Shutter MD Electronically signed by Zoila Shutter MD Signature Date/Time: 10/21/2022/5:22:30 PM    Final    CT ABDOMEN PELVIS WO CONTRAST  Result Date: 10/19/2022 CLINICAL DATA:  Fever. Weakness for couple of days.  Recently diagnosis C diff and diarrhea. Sacral wound. EXAM: CT ABDOMEN AND PELVIS WITHOUT CONTRAST TECHNIQUE: Multidetector CT imaging of the abdomen and pelvis was performed following the standard protocol without IV contrast. RADIATION DOSE REDUCTION: This exam was performed according to the departmental dose-optimization program which includes automated exposure control, adjustment of the mA and/or kV according to patient size and/or use of iterative reconstruction technique. COMPARISON:  CT examination dated January 17, 2022 FINDINGS: Lower chest: Small bilateral pleural effusions left greater than the right with left basilar atelectasis. Moderate-sized hiatal hernia. Hepatobiliary: No focal liver abnormality is seen. Status post cholecystectomy. No biliary dilatation. Pancreas:  Unremarkable. No pancreatic ductal dilatation or surrounding inflammatory changes. Spleen: Normal in size without focal abnormality. Adrenals/Urinary Tract: Adrenal glands are unremarkable. Kidneys are normal, without renal calculi, focal lesion, or hydronephrosis. Bladder is unremarkable. Stomach/Bowel: Stomach is within normal limits. Appendix appears normal. No evidence of bowel wall thickening, distention, or inflammatory changes. Moderate amount of stool throughout the colon. Vascular/Lymphatic: Aortic atherosclerosis. No enlarged abdominal or pelvic lymph nodes. Reproductive: Status post hysterectomy. No adnexal masses. Other: Marked generalized anasarca. No abdominopelvic ascites. Skin thickening and decubitus sacral ulcer. Musculoskeletal: Posterior spinal and interbody no acute osseous abnormality. Fusion at L4-S1 IMPRESSION: 1. Marked generalized anasarca. 2. Small bilateral pleural effusions left greater than the right with left basilar atelectasis. 3. Moderate-sized hiatal hernia. 4. No CT evidence of acute abdominal/pelvic process. 5. Skin thickening and decubitus sacral ulcer. 6. Aortic atherosclerosis. Aortic Atherosclerosis (ICD10-I70.0). Electronically Signed   By: Larose Hires D.O.   On: 10/19/2022 17:44   DG Chest Portable 1 View  Result Date: 10/19/2022 CLINICAL DATA:  Fever. Weakness. EXAM: PORTABLE CHEST 1 VIEW COMPARISON:  Radiograph 09/30/2022 FINDINGS: Stable cardiomegaly. Again seen aortic atherosclerosis and tortuosity. Retrocardiac hiatal hernia. Similar right pleural effusion. Improved left pleural effusion. Vascular congestion again seen, slight worsening. No confluent consolidation. The bones are diffusely under mineralized. IMPRESSION: 1. No acute airspace disease to account for fever. 2. Stable cardiomegaly. Similar right pleural effusion. Improved left pleural effusion. 3. Worsening vascular congestion. Electronically Signed   By: Narda Rutherford M.D.   On: 10/19/2022 16:26    DG Shoulder Right  Result Date: 10/07/2022 Right shoulder Patient presents with traumatic onset of right shoulder pain with ecchymosis around the shoulder along the lateral arm Trauma history is remote perhaps fell on 6 May could have been 2 May April 30 she was evaluated as well is April 4 The glenohumeral joint remains intact as does Shenton's line of the shoulder there is osteopenia There appears to be a fracture of the distal clavicle it appears to be probably a type I or arthritis is seen in the [facility] joint area there is prominence of the distal clavicle in relation to the acromion On the lateral view of the shoulder i.e. scapular Y there is a fracture of the distal clavicle it is probably grade 1 distal clavicle fracture   DG CHEST PORT 1 VIEW  Result Date: 09/30/2022 CLINICAL DATA:  PICC.  Removed prior to, to the hospital EXAM: PORTABLE CHEST 1 VIEW COMPARISON:  X-ray 07/06/2022 FINDINGS: Enlarged cardiopericardial silhouette with tortuous and ectatic aorta. Film is rotated to the right. Small effusions with mild adjacent opacity. Slight central vascular congestion. No pneumothorax. Overlapping cardiac leads. Osteopenia. Moderate hiatal hernia IMPRESSION: Rotated radiograph. Bilateral small effusions with the adjacent opacities. Enlarged heart with vascular congestion Electronically Signed   By: Karen Kays M.D.   On: 09/30/2022 20:07  Micro Results   Recent Results (from the past 240 hour(s))  Culture, blood (routine x 2)     Status: None   Collection Time: 10/19/22  3:53 PM   Specimen: Right Antecubital; Blood  Result Value Ref Range Status   Specimen Description   Final    RIGHT ANTECUBITAL BOTTLES DRAWN AEROBIC AND ANAEROBIC   Special Requests   Final    Blood Culture results may not be optimal due to an excessive volume of blood received in culture bottles   Culture   Final    NO GROWTH 5 DAYS Performed at [facility], [address] [place], [place] [address]    Report Status  10/24/2022 FINAL  Final  Culture, blood (routine x 2)     Status: None   Collection Time: 10/19/22  4:06 PM   Specimen: Left Antecubital; Blood  Result Value Ref Range Status   Specimen Description   Final    LEFT ANTECUBITAL BOTTLES DRAWN AEROBIC AND ANAEROBIC   Special Requests   Final    Blood Culture results may not be optimal due to an excessive volume of blood received in culture bottles   Culture   Final    NO GROWTH 5 DAYS Performed at [facility], [address] [place], [place] [address]    Report Status 10/24/2022 FINAL  Final  Urine Culture     Status: Abnormal   Collection Time: 10/19/22  6:55 PM   Specimen: Urine, Random  Result Value Ref Range Status   Specimen Description   Final    URINE, RANDOM Performed at [facility], [address] [place], [place] [address]    Special Requests   Final    URINE, RANDOM Performed at [facility], [address] [place], [place] [address]    Culture >=100,000 COLONIES/mL KLEBSIELLA PNEUMONIAE (A)  Final   Report Status 10/22/2022 FINAL  Final   Organism ID, Bacteria KLEBSIELLA PNEUMONIAE (A)  Final      Susceptibility   Klebsiella pneumoniae - MIC*    AMPICILLIN >=32 RESISTANT Resistant     CEFAZOLIN <=4 SENSITIVE Sensitive     CEFEPIME <=0.12 SENSITIVE Sensitive     CEFTRIAXONE <=0.25 SENSITIVE Sensitive     CIPROFLOXACIN <=0.25 SENSITIVE Sensitive     GENTAMICIN <=1 SENSITIVE Sensitive     IMIPENEM <=0.25 SENSITIVE Sensitive     NITROFURANTOIN 32 SENSITIVE Sensitive     TRIMETH/SULFA <=20 SENSITIVE Sensitive     AMPICILLIN/SULBACTAM 4 SENSITIVE Sensitive     PIP/TAZO <=4 SENSITIVE Sensitive     * >=100,000 COLONIES/mL KLEBSIELLA PNEUMONIAE   Today   Subjective    Latha Graziosi today has no new complaints  No fever  Or chills   No Nausea, Vomiting or Diarrhea - No bleeding concerns  I called and left Voicemail for son/HCPOA---Justin Boonstra at [phone] and I also called and left message for her  Brother [name]---[phone]        Patient has been seen and examined prior to discharge   Objective   Blood pressure 115/64, pulse 71, temperature 98.7 F (37.1 C), resp. rate 18, height 5\' 2"  (1.575 m), weight 70.5 kg, SpO2 100 %.   Intake/Output Summary (Last 24 hours) at 10/28/2022 1250 Last data filed at 10/28/2022 1100 Gross per 24 hour  Intake 1800.11 ml  Output 1350 ml  Net 450.11 ml   Exam Gen:- Awake Alert, in no acute distress  HEENT:- [place].AT, No sclera icterus Neck-Supple Neck,No JVD,.  Lungs-  CTAB , fair air movement bilaterally  CV- S1, S2 normal, RRR Abd-  +ve B.Sounds, Abd Soft, No tenderness,    Extremity--- Rt leg swelling and erythema is much improved--- -good pedal pulses  -Despite Doppler finding of extensive deep to the left lower extremity,  left LE exam is largely unremarkable (no left leg or thigh swelling, no tenderness, no warmth, Psych-affect is appropriate, oriented x3, forgetful at times Neuro-generalized weakness, no new focal deficits, no tremors Skin--sacral decub appears to be improving/healing slowly- -please see before and after photos (below) of right lower extremity cellulitis with significant improvement--   Media Information  Document Information  Photos  Rt Leg redness and swelling  10/24/2022 05:00  Attached To:  Hospital Encounter on 10/19/22  Source Information  Shon Hale, MD  [facility]     Media Information  Document Information  Photos  Improved Rt leg swelling  10/28/2022 11:54  Attached To:  Hospital Encounter on 10/19/22  Source Information  Shon Hale, MD  [facility]      Media Information  Document Information  Photos  Healing sacral decub  10/28/2022 11:59  Attached To:  Hospital Encounter on 10/19/22  Source Information  Shon Hale, MD  [facility]     Data Review   CBC w Diff:  Lab Results  Component Value Date   WBC 12.8 (H) 10/28/2022   HGB 8.9 (L) 10/28/2022    HGB 13.9 05/06/2022   HCT 28.8 (L) 10/28/2022   HCT 44.2 05/06/2022   PLT 452 (H) 10/28/2022   PLT 318 05/06/2022   LYMPHOPCT 2 10/20/2022   BANDSPCT 36 10/20/2022   MONOPCT 2 10/20/2022   EOSPCT 0 10/20/2022   BASOPCT 0 10/20/2022   CMP:  Lab Results  Component Value Date   NA 134 (L) 10/28/2022   NA 143 05/06/2022   K 4.5 10/28/2022   CL 101 10/28/2022   CO2 26 10/28/2022   BUN 45 (H) 10/28/2022   BUN 16 05/06/2022   CREATININE 1.96 (H) 10/28/2022   PROT 5.5 (L) 10/20/2022   PROT 6.4 05/06/2022   ALBUMIN 1.6 (L) 10/26/2022   ALBUMIN 3.5 (L) 05/06/2022   BILITOT 0.9 10/20/2022   BILITOT 0.4 05/06/2022   ALKPHOS 188 (H) 10/20/2022   AST 37 10/20/2022   ALT 37 10/20/2022   Total Discharge time is about 33 minutes  Shon Hale M.D on 10/28/2022 at 12:50 PM  Go to [email or web address] -  for contact info  [facility] - Office  [phone]

## 2022-10-28 NOTE — Discharge Instructions (Addendum)
1)Avoid ibuprofen/Advil/Aleve/Motrin/Goody Powders/Naproxen/BC powders/Meloxicam/Diclofenac/Indomethacin and other Nonsteroidal anti-inflammatory medications as these will make you more likely to bleed and can cause stomach ulcers, can also cause Kidney problems.   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 Fill defect with calcium alginate [place] # [phone]), using cotton tipped applicator to fill undermined areas. Top with dry gauze, cover with silicone foam. Turn side to side      Information on my medicine - ELIQUIS (apixaban)  This medication education was reviewed with me or my healthcare representative as part of my discharge preparation.    Why was Eliquis prescribed for you? Eliquis was prescribed to treat blood clots that may have been found in the veins of your legs (deep vein thrombosis) or in your lungs (pulmonary embolism) and to reduce the risk of them occurring again.  What do You need to know about Eliquis ? The starting dose is 10 mg (two 5 mg tablets) taken TWICE daily for the FIRST SEVEN (7) DAYS, then on 11/04/2022 the dose is reduced to ONE 5 mg tablet taken TWICE daily.  Eliquis may be taken with or without food.   Try to take the dose about the same time in the morning and in the evening. If you have difficulty swallowing the tablet whole please discuss with your pharmacist how to take the medication safely.  Take Eliquis exactly as prescribed and DO NOT stop taking Eliquis without talking to the doctor who prescribed the medication.  Stopping may increase your risk of developing a new blood clot.  Refill your prescription before you  run out.  After discharge, you should have regular check-up appointments with your healthcare provider that is prescribing your Eliquis.    What do you do if you miss a dose? If a dose of ELIQUIS is not taken at the scheduled time, take it as soon as possible on the same day and twice-daily administration should be resumed. The dose should not be doubled to make up for a missed dose.  Important Safety Information A possible side effect of Eliquis is bleeding. You should call your healthcare provider right away if you experience any of the following: Bleeding from an injury or your nose that does not stop. Unusual colored urine (red or dark brown) or unusual colored stools (red or black). Unusual bruising for unknown reasons. A serious fall or if you hit your head (even if there is no bleeding).  Some medicines may interact with Eliquis and might increase your risk of bleeding or clotting while on Eliquis. To help avoid this, consult your healthcare provider or pharmacist prior to using any new prescription or non-prescription medications, including herbals, vitamins, non-steroidal anti-inflammatory drugs (NSAIDs) and supplements.  This website has more information on Eliquis (apixaban): http://www.eliquis.com/eliquis/home

## 2022-10-28 NOTE — Consult Note (Signed)
[facility] Customer service manager [facility]) Accountable Care Organization (ACO) [facility] Liaison Note  10/28/2022  [name] 1945-10-28 [id number]  Location: [facility] Liaison screened the patient remotely at [facility].  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) The patient was screened for 30 readmission hospitalization with noted extreme risk score for unplanned readmission risk with 4 IP/1 ED  in 6 months.  The patient was assessed for potential [facility]) Care Management service needs for post hospital transition for care coordination. Review of patient's electronic medical record reveals patient was admitted for DVT. Pt  returning to [facility] (SNF) for ongoing rehab. Facility will address any ongoing needs for this pt [name]).   [facility] Care Management/Population Health does not replace or interfere with any arrangements made by the Inpatient Transition of Care team.   For questions contact:   Elliot Cousin, RN, BSN [facility] Liaison [place]   [facility]  Population Health Office Hours MTWF  8:00 am-6:00 pm Off on Thursday [phone] mobile [phone] [Office toll free line][facility] Office Hours are M-F 8:30 - 5 pm 24 hour nurse advise line [phone] Concierge  [name]

## 2022-10-28 NOTE — Progress Notes (Signed)
Palliative: [name] is lying quietly in bed.  She appears acutely/chronically ill and somewhat frail.  She greets me, making and mostly keeping eye contact.  She is alert and oriented, able to make her needs known.  There is no family at bedside at this time.  [name] tells me that she had a rough night due to leg pain.  We talked about her DVT and the treatment plan.  We talk about the plan for returning to [facility] today.  She states that she is aware.  She tells me that she would eventually like to return to her own home.  We talked about the benefits of continued palliative care services.  Conference with bedside nurse and transition of care team related to patient condition, needs, goals of care, disposition.  Plan: At this point continue full scope/full code.  Return to [facility] for rehab, possible long-term care.  Would ultimately like to return home.  Would benefit from palliative services at [facility]  35 minutes  Lillia Carmel, NP Palliative medicine team Team phone [phone] Greater than 50% of this time was spent counseling and coordinating care related to the above assessment and plan.

## 2022-10-28 NOTE — TOC Transition Note (Addendum)
Transition of Care [facility]) - CM/SW Discharge Note   Patient Details  Name: [name] MRN: [id number] Date of Birth: Jul 28, 1945  Transition of Care [facility]) CM/SW Contact:  Annice Needy, LCSW Phone Number: 10/28/2022, 1:15 PM   Clinical Narrative:    D/c clinicals sent to facility. Nurse to call report. [facility] to provide transport. TOC Signing off.      Barriers to Discharge: Continued Medical Work up   Patient Goals and CMS Choice   Choice offered to / list presented to : Patient  Discharge Placement                [facility] Patient to be transferred to facility by: [facility] Name of family member notified: message left for son [name] Patient and family notified of of transfer: 10/28/22  Discharge Plan and Services Additional resources added to the After Visit Summary for   In-house Referral: Clinical Social Work   Post Acute Care Choice: Resumption of Svcs/PTA Provider                               Social Determinants of Health (SDOH) Interventions SDOH Screenings   Food Insecurity: No Food Insecurity (10/19/2022)  Housing: Low Risk  (10/19/2022)  Transportation Needs: No Transportation Needs (10/19/2022)  Utilities: Not At Risk (10/19/2022)  Alcohol Screen: Low Risk  (11/28/2021)  Depression (PHQ2-9): Low Risk  (05/06/2022)  Financial Resource Strain: Low Risk  (11/28/2021)  Physical Activity: Inactive (11/28/2021)  Social Connections: Socially Integrated (11/28/2021)  Stress: Stress Concern Present (11/28/2021)  Tobacco Use: Low Risk  (10/25/2022)     Readmission Risk Interventions    10/21/2022   10:12 AM 08/30/2022    3:36 PM  Readmission Risk Prevention Plan  Transportation Screening Complete Complete  PCP or Specialist Appt within 3-5 Days  Complete  Social Work Consult for Recovery Care Planning/Counseling  Complete  Palliative Care Screening  Not Applicable  Medication Review Oceanographer) Complete Complete  HRI or Home  Care Consult Complete   SW Recovery Care/Counseling Consult Complete   Palliative Care Screening Not Applicable   Skilled Nursing Facility Complete

## 2022-10-29 DIAGNOSIS — I82402 Acute embolism and thrombosis of unspecified deep veins of left lower extremity: Secondary | ICD-10-CM | POA: Diagnosis not present

## 2022-10-29 DIAGNOSIS — A0472 Enterocolitis due to Clostridium difficile, not specified as recurrent: Secondary | ICD-10-CM | POA: Diagnosis not present

## 2022-10-29 DIAGNOSIS — N390 Urinary tract infection, site not specified: Secondary | ICD-10-CM | POA: Diagnosis not present

## 2022-10-29 DIAGNOSIS — L03115 Cellulitis of right lower limb: Secondary | ICD-10-CM | POA: Diagnosis not present

## 2022-10-30 ENCOUNTER — Encounter (HOSPITAL_BASED_OUTPATIENT_CLINIC_OR_DEPARTMENT_OTHER): Payer: Medicare Other | Attending: Physician Assistant | Admitting: Physician Assistant

## 2022-10-30 DIAGNOSIS — K219 Gastro-esophageal reflux disease without esophagitis: Secondary | ICD-10-CM | POA: Diagnosis not present

## 2022-10-30 DIAGNOSIS — N183 Chronic kidney disease, stage 3 unspecified: Secondary | ICD-10-CM | POA: Insufficient documentation

## 2022-10-30 DIAGNOSIS — J449 Chronic obstructive pulmonary disease, unspecified: Secondary | ICD-10-CM | POA: Insufficient documentation

## 2022-10-30 DIAGNOSIS — I2510 Atherosclerotic heart disease of native coronary artery without angina pectoris: Secondary | ICD-10-CM | POA: Diagnosis not present

## 2022-10-30 DIAGNOSIS — L89154 Pressure ulcer of sacral region, stage 4: Secondary | ICD-10-CM | POA: Diagnosis not present

## 2022-10-30 DIAGNOSIS — N1832 Chronic kidney disease, stage 3b: Secondary | ICD-10-CM | POA: Diagnosis not present

## 2022-10-30 DIAGNOSIS — D649 Anemia, unspecified: Secondary | ICD-10-CM | POA: Diagnosis not present

## 2022-10-30 DIAGNOSIS — I130 Hypertensive heart and chronic kidney disease with heart failure and stage 1 through stage 4 chronic kidney disease, or unspecified chronic kidney disease: Secondary | ICD-10-CM | POA: Insufficient documentation

## 2022-10-30 DIAGNOSIS — L03115 Cellulitis of right lower limb: Secondary | ICD-10-CM | POA: Diagnosis not present

## 2022-10-30 DIAGNOSIS — E559 Vitamin D deficiency, unspecified: Secondary | ICD-10-CM | POA: Diagnosis not present

## 2022-10-30 DIAGNOSIS — N390 Urinary tract infection, site not specified: Secondary | ICD-10-CM | POA: Diagnosis not present

## 2022-10-30 DIAGNOSIS — S31000D Unspecified open wound of lower back and pelvis without penetration into retroperitoneum, subsequent encounter: Secondary | ICD-10-CM | POA: Diagnosis not present

## 2022-10-30 DIAGNOSIS — I480 Paroxysmal atrial fibrillation: Secondary | ICD-10-CM | POA: Diagnosis not present

## 2022-10-30 DIAGNOSIS — E039 Hypothyroidism, unspecified: Secondary | ICD-10-CM | POA: Diagnosis not present

## 2022-10-30 DIAGNOSIS — A0472 Enterocolitis due to Clostridium difficile, not specified as recurrent: Secondary | ICD-10-CM | POA: Diagnosis not present

## 2022-10-30 DIAGNOSIS — F411 Generalized anxiety disorder: Secondary | ICD-10-CM | POA: Diagnosis not present

## 2022-10-30 DIAGNOSIS — I509 Heart failure, unspecified: Secondary | ICD-10-CM | POA: Insufficient documentation

## 2022-10-30 DIAGNOSIS — I82402 Acute embolism and thrombosis of unspecified deep veins of left lower extremity: Secondary | ICD-10-CM | POA: Diagnosis not present

## 2022-10-30 DIAGNOSIS — E8809 Other disorders of plasma-protein metabolism, not elsewhere classified: Secondary | ICD-10-CM | POA: Diagnosis not present

## 2022-11-05 ENCOUNTER — Ambulatory Visit: Payer: Medicare Other | Admitting: Nurse Practitioner

## 2022-11-06 ENCOUNTER — Encounter (HOSPITAL_BASED_OUTPATIENT_CLINIC_OR_DEPARTMENT_OTHER): Payer: Medicare Other | Admitting: Physician Assistant

## 2022-11-07 DIAGNOSIS — R600 Localized edema: Secondary | ICD-10-CM | POA: Diagnosis not present

## 2022-11-07 DIAGNOSIS — L03115 Cellulitis of right lower limb: Secondary | ICD-10-CM | POA: Diagnosis not present

## 2022-11-07 DIAGNOSIS — I82402 Acute embolism and thrombosis of unspecified deep veins of left lower extremity: Secondary | ICD-10-CM | POA: Diagnosis not present

## 2022-11-12 ENCOUNTER — Emergency Department (HOSPITAL_COMMUNITY)
Admission: EM | Admit: 2022-11-12 | Discharge: 2022-11-13 | Disposition: A | Discharge status: Home / Self Care | Payer: Medicare Other | Attending: Emergency Medicine | Admitting: Emergency Medicine

## 2022-11-12 ENCOUNTER — Encounter (HOSPITAL_COMMUNITY): Payer: Self-pay | Admitting: *Deleted

## 2022-11-12 ENCOUNTER — Other Ambulatory Visit: Payer: Self-pay

## 2022-11-12 DIAGNOSIS — L03115 Cellulitis of right lower limb: Secondary | ICD-10-CM | POA: Diagnosis not present

## 2022-11-12 DIAGNOSIS — Z91040 Latex allergy status: Secondary | ICD-10-CM | POA: Insufficient documentation

## 2022-11-12 DIAGNOSIS — M7989 Other specified soft tissue disorders: Secondary | ICD-10-CM | POA: Diagnosis present

## 2022-11-12 DIAGNOSIS — Z7901 Long term (current) use of anticoagulants: Secondary | ICD-10-CM | POA: Diagnosis not present

## 2022-11-12 DIAGNOSIS — I82402 Acute embolism and thrombosis of unspecified deep veins of left lower extremity: Secondary | ICD-10-CM | POA: Diagnosis not present

## 2022-11-12 DIAGNOSIS — I872 Venous insufficiency (chronic) (peripheral): Secondary | ICD-10-CM | POA: Diagnosis not present

## 2022-11-12 DIAGNOSIS — R600 Localized edema: Secondary | ICD-10-CM | POA: Diagnosis not present

## 2022-11-12 LAB — CBC WITH DIFFERENTIAL/PLATELET
Abs Immature Granulocytes: 0.05 10*3/uL (ref 0.00–0.07)
Basophils Absolute: 0 10*3/uL (ref 0.0–0.1)
Basophils Relative: 0 %
Eosinophils Absolute: 0.3 10*3/uL (ref 0.0–0.5)
Eosinophils Relative: 3 %
HCT: 27 % — ABNORMAL LOW (ref 36.0–46.0)
Hemoglobin: 8.4 g/dL — ABNORMAL LOW (ref 12.0–15.0)
Immature Granulocytes: 1 %
Lymphocytes Relative: 20 %
Lymphs Abs: 1.8 10*3/uL (ref 0.7–4.0)
MCH: 30 pg (ref 26.0–34.0)
MCHC: 31.1 g/dL (ref 30.0–36.0)
MCV: 96.4 fL (ref 80.0–100.0)
Monocytes Absolute: 0.8 10*3/uL (ref 0.1–1.0)
Monocytes Relative: 9 %
Neutro Abs: 6.1 10*3/uL (ref 1.7–7.7)
Neutrophils Relative %: 67 %
Platelets: 423 10*3/uL — ABNORMAL HIGH (ref 150–400)
RBC: 2.8 MIL/uL — ABNORMAL LOW (ref 3.87–5.11)
RDW: 15.7 % — ABNORMAL HIGH (ref 11.5–15.5)
WBC: 9 10*3/uL (ref 4.0–10.5)
nRBC: 0 % (ref 0.0–0.2)

## 2022-11-12 LAB — COMPREHENSIVE METABOLIC PANEL
ALT: 15 U/L (ref 0–44)
AST: 15 U/L (ref 15–41)
Albumin: 2.5 g/dL — ABNORMAL LOW (ref 3.5–5.0)
Alkaline Phosphatase: 209 U/L — ABNORMAL HIGH (ref 38–126)
Anion gap: 8 (ref 5–15)
BUN: 41 mg/dL — ABNORMAL HIGH (ref 8–23)
CO2: 22 mmol/L (ref 22–32)
Calcium: 8.2 mg/dL — ABNORMAL LOW (ref 8.9–10.3)
Chloride: 104 mmol/L (ref 98–111)
Creatinine, Ser: 2 mg/dL — ABNORMAL HIGH (ref 0.44–1.00)
GFR, Estimated: 25 mL/min — ABNORMAL LOW (ref >60)
Glucose, Bld: 77 mg/dL (ref 70–99)
Potassium: 4 mmol/L (ref 3.5–5.1)
Sodium: 134 mmol/L — ABNORMAL LOW (ref 135–145)
Total Bilirubin: 0.8 mg/dL (ref 0.3–1.2)
Total Protein: 6.6 g/dL (ref 6.5–8.1)

## 2022-11-12 LAB — URINALYSIS, W/ REFLEX TO CULTURE (INFECTION SUSPECTED)
Bilirubin Urine: NEGATIVE
Glucose, UA: NEGATIVE mg/dL
Hgb urine dipstick: NEGATIVE
Ketones, ur: NEGATIVE mg/dL
Leukocytes,Ua: NEGATIVE
Nitrite: NEGATIVE
Protein, ur: NEGATIVE mg/dL
Specific Gravity, Urine: 1.008 (ref 1.005–1.030)
pH: 5 (ref 5.0–8.0)

## 2022-11-12 NOTE — ED Triage Notes (Signed)
Pt brought in by [facility] for c/o bilateral leg swelling; pt was diagnosed and treated for a DVT to her left leg a couple of weeks ago and is now c/o pain, redness and swelling to right leg

## 2022-11-12 NOTE — ED Provider Notes (Signed)
[facility] Provider Note   CSN: [id number] Arrival date & time: 11/12/22  1236     History  Chief Complaint  Patient presents with   Leg Swelling    [name] is a 77 y.o. female sent in from her skilled nursing facility for evaluation of 6 peripheral edema with concern for cellulitis.  She has some baseline mild confusion, history of DVT currently on Eliquis, and chronic decubitus ulcer with wound VAC in place.  Patient is unable to provide history and states "I am not really sure why I am here."  History is gathered by [facility] which was sent over as well as nursing staff who took report from EMS.  States that her right leg is more red and swollen than the left and they were concerned for potential infection.  She has no worsening in her baseline mental status.  Previous DVT diagnosis on 10/23/2022 showed DVT on the left.  No DVT on the right.  HPI     Home Medications Prior to Admission medications   Medication Sig Start Date End Date Taking? Authorizing Provider  acetaminophen (TYLENOL) 325 MG tablet Take 2 tablets (650 mg total) by mouth every 6 (six) hours as needed for mild pain (or Fever >/= 101). 10/01/22   Shon Hale, MD  Amino Acids-Protein Hydrolys (PRO-STAT) LIQD Take 30 mLs by mouth 2 (two) times daily.    [provider]  Apixaban Starter Pack, 10mg  and 5mg , (ELIQUIS DVT/PE STARTER PACK) Take as directed on package: start with two-5mg  tablets twice daily for 7 days. On day 8, switch to one-5mg  tablet twice daily. 10/28/22   Shon Hale, MD  ascorbic acid (VITAMIN C) 500 MG tablet Take 500 mg by mouth 2 (two) times daily.    [provider]  Cholecalciferol (VITAMIN D3) 50 MCG (2000 UT) CAPS Take 2,000 Units by mouth every morning.    [provider]  citalopram (CELEXA) 40 MG tablet Take 1 tablet (40 mg total) by mouth daily. 05/06/22   Daphine Deutscher Mary-Margaret, FNP  feeding supplement, ENSURE  COMPLETE, (ENSURE COMPLETE) LIQD Take 237 mLs by mouth 2 (two) times daily between meals. 10/01/22   Shon Hale, MD  ferrous sulfate 325 (65 FE) MG tablet Take 1 tablet (325 mg total) by mouth daily with breakfast. 10/01/22   [name], Courage, MD  furosemide (LASIX) 20 MG tablet Take 1 tablet (20 mg total) by mouth daily. 10/29/22   Shon Hale, MD  glucose 4 GM chewable tablet Chew 1 tablet (4 g total) by mouth as needed for low blood sugar. 10/01/22   Shon Hale, MD  levothyroxine (SYNTHROID) 75 MCG tablet Take 1 tablet (75 mcg total) by mouth daily. 05/07/22   Daphine Deutscher, Mary-Margaret, FNP  Magnesium Oxide 400 MG CAPS Take 1 capsule (400 mg total) by mouth daily. 09/04/22   Burnadette Pop, MD  mirtazapine (REMERON) 7.5 MG tablet Take 1 tablet (7.5 mg total) by mouth at bedtime. For appetite stimulation 10/28/22   Shon Hale, MD  Multiple Vitamin (MULTIVITAMIN) tablet Take 1 tablet by mouth every morning.    [provider]  nutrition supplement, JUVEN, (JUVEN) PACK Take 1 packet by mouth 2 (two) times daily between meals.    [provider]  nystatin (MYCOSTATIN/NYSTOP) powder Apply topically 3 (three) times daily. On groins 09/04/22   Burnadette Pop, MD  OxyCODONE HCl, Abuse Deter, (OXAYDO) 5 MG TABA Take 5 mg by mouth daily as needed (severe pain). 10/28/22  Shon Hale, MD  rOPINIRole (REQUIP XL) 2 MG 24 hr tablet Take 2 mg by mouth at bedtime.    [provider]  sodium bicarbonate 650 MG tablet Take 1 tablet (650 mg total) by mouth 3 (three) times daily. 10/01/22 10/01/23  Shon Hale, MD  vancomycin (VANCOCIN) 125 MG capsule Take 1 capsule (125 mg total) by mouth See admin instructions. Take Vancomycin  125 mg-- 1 capsule twice daily for 7 days, then 1 capsule once daily for 7 days, then 1 capsule every other day for 7 days, then 1 capsule every 3 days for 7 days then Stop---Last dose is 11/25/22 10/28/22   Shon Hale, MD  Zinc 220 (50 Zn) MG CAPS  Take 220 mg by mouth every morning. 10/01/22   Shon Hale, MD      Allergies    Celebrex [celecoxib], Keflet [cephalexin], Penicillins, Sulfa antibiotics, Kenalog [triamcinolone acetonide], Latex, Lisinopril, Mobic [meloxicam], and Penicillin g    Review of Systems   Review of Systems  Physical Exam Updated Vital Signs BP 139/72 (BP Location: Right Arm)   Pulse 63   Temp 97.8 F (36.6 C)   Resp 16   Ht 5\' 2"  (1.575 m)   Wt 70.5 kg   SpO2 100%   BMI 28.43 kg/m  Physical Exam Vitals and nursing note reviewed.  Constitutional:      General: She is not in acute distress.    Appearance: She is well-developed. She is not diaphoretic.  HENT:     Head: Normocephalic and atraumatic.     Right Ear: External ear normal.     Left Ear: External ear normal.     Nose: Nose normal.     Mouth/Throat:     Mouth: Mucous membranes are moist.  Eyes:     General: No scleral icterus.    Conjunctiva/sclera: Conjunctivae normal.  Cardiovascular:     Rate and Rhythm: Normal rate and regular rhythm.     Heart sounds: Normal heart sounds. No murmur heard.    No friction rub. No gallop.  Pulmonary:     Effort: Pulmonary effort is normal. No respiratory distress.     Breath sounds: Normal breath sounds.  Abdominal:     General: Bowel sounds are normal. There is no distension.     Palpations: Abdomen is soft. There is no mass.     Tenderness: There is no abdominal tenderness. There is no guarding.  Musculoskeletal:     Cervical back: Normal range of motion.  Skin:    General: Skin is warm and dry.  Neurological:     Mental Status: She is alert and oriented to person, place, and time.  Psychiatric:        Behavior: Behavior normal.     ED Results / Procedures / Treatments   Labs (all labs ordered are listed, but only abnormal results are displayed) Labs Reviewed - No data to display  EKG None  Radiology No results found.  Procedures Procedures    Medications Ordered in  ED Medications - No data to display  ED Course/ Medical Decision Making/ A&P Clinical Course as of 11/12/22 1612  Tue Nov 12, 2022  1554 WBC: 9.0 [AH]  1558 Hemoglobin(!): 8.4 [AH]  1558 Sodium(!): 134 [AH]  1558 Creatinine(!): 2.00 [AH]  1558 BUN(!): 41 [AH]    Clinical Course User Index [AH] Arthor Captain, PA-C  Medical Decision Making 77 year old female sent in from her skilled nursing facility for evaluation of swelling in the lower extremity, known DVT on the contralateral leg.  She has chronic edema, she also stays in the same position in bed due to a known decubitus ulcer with a wound VAC on.  Differential diagnosis for leg swelling clinical includes lymphedema, CHF, hypoalbuminemia and liver disease, renal failure, venous insufficiency.  I ordered labs which showed no acute findings.  In particular patient does not have an elevated white blood cell count.  She also does not have any significant pain in the lower extremity.  I saw the patient and shared visit with Dr. Deretha Emory both of [place] agree that this does not appear to be consistent with a diagnosis of cellulitis.  This does appear of significant for stasis dermatitis and feels she would be best served with Unna boots or compression and conservative management.  Patient appears otherwise appropriate for discharge.  I considered antibiotics however patient does have also a history of C. difficile and in the absence of real evidence of cellulitis to feel that this would be more risk to the patient than benefit.  Amount and/or Complexity of Data Reviewed Labs: ordered. Decision-making details documented in ED Course.           Final Clinical Impression(s) / ED Diagnoses Final diagnoses:  Stasis dermatitis    Rx / DC Orders ED Discharge Orders     None         Arthor Captain, PA-C 11/12/22 1750    Vanetta Mulders, MD 11/14/22 1410

## 2022-11-12 NOTE — ED Provider Notes (Signed)
I provided a substantive portion of the care of this patient.  I personally made/approved the management plan for this patient and take responsibility for the patient management.      Patient seen by me along with physician assistant.  Patient sent in from [facility] nursing facility for concerns for cellulitis of her right lower extremity.  Patient has had chronic swelling of both lower extremities.  Patient denies any increased pain.  Clinically this erythema seems to be secondary to edema fluid in the legs.  There is a component of it in her left lower extremity below the calf as well.  No increased warmth.  Does not have a white count.  No significant pain.  I feel that this is chronic skin changes secondary to the edema and not consistent with cellulitis.  Patient is on Eliquis already for a DVT.  She does have swelling in both legs.   Vanetta Mulders, MD 11/12/22 [phone]

## 2022-11-12 NOTE — ED Notes (Signed)
Waiting on transport

## 2022-11-12 NOTE — ED Notes (Signed)
Report given to and accepted by nurse, Martie Lee at receiving facility.

## 2022-11-12 NOTE — Discharge Instructions (Signed)
Patient needs gradient compression vs. Unna boots.  Normal white blood cells. Does not appear to have a cellulitis per our workup.
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
## 2022-11-13 ENCOUNTER — Encounter (HOSPITAL_BASED_OUTPATIENT_CLINIC_OR_DEPARTMENT_OTHER): Payer: Medicare Other | Admitting: Physician Assistant

## 2022-11-13 DIAGNOSIS — L89154 Pressure ulcer of sacral region, stage 4: Secondary | ICD-10-CM | POA: Diagnosis not present

## 2022-11-13 DIAGNOSIS — N183 Chronic kidney disease, stage 3 unspecified: Secondary | ICD-10-CM | POA: Diagnosis not present

## 2022-11-13 DIAGNOSIS — I2510 Atherosclerotic heart disease of native coronary artery without angina pectoris: Secondary | ICD-10-CM | POA: Diagnosis not present

## 2022-11-13 DIAGNOSIS — I130 Hypertensive heart and chronic kidney disease with heart failure and stage 1 through stage 4 chronic kidney disease, or unspecified chronic kidney disease: Secondary | ICD-10-CM | POA: Diagnosis not present

## 2022-11-13 DIAGNOSIS — I509 Heart failure, unspecified: Secondary | ICD-10-CM | POA: Diagnosis not present

## 2022-11-13 DIAGNOSIS — I480 Paroxysmal atrial fibrillation: Secondary | ICD-10-CM | POA: Diagnosis not present

## 2022-11-13 NOTE — Progress Notes (Addendum)
DELMA, DRONE ([id number]) [id number] Page 1 of 7 Visit Report for 11/13/2022 Chief Complaint Document Details Patient Name: Date of Service: [name], [name] NA 11/13/2022 9:30 A M Medical Record Number: [id number] Patient Account Number: [id number] Date of Birth/Sex: Treating RN: [birth year] (77 y.o. F) Primary Care Provider: Daphine Deutscher, Mary-Margaret Other Clinician: Referring Provider: Treating Provider/Extender: Lauretta Grill, Mary-Margaret Weeks in Treatment: 4 Information Obtained from: Patient Chief Complaint Sacral pressure ulcer Electronic Signature(s) Signed: 11/13/2022 10:14:29 AM By: Allen Derry PA-C Entered By: Allen Derry on 11/13/2022 10:14:29 -------------------------------------------------------------------------------- Debridement Details Patient Name: Date of Service: [name], [name] NA 11/13/2022 9:30 A M Medical Record Number: [id number] Patient Account Number: [id number] Date of Birth/Sex: Treating RN: 06-Apr-1946 (77 y.o. [name], Millard.Loa Primary Care Provider: Daphine Deutscher, Mary-Margaret Other Clinician: Referring Provider: Treating Provider/Extender: Lauretta Grill, Mary-Margaret Weeks in Treatment: 4 Debridement Performed for Assessment: Wound #1 Sacrum Performed By: Physician Cindy Kelp, PA Debridement Type: Debridement Level of Consciousness (Pre-procedure): Awake and Alert Pre-procedure Verification/Time Out Yes - 10:15 Taken: Start Time: 10:16 Pain Control: Lidocaine 5% topical ointment Percent of Wound Bed Debrided: 100% T Area Debrided (cm): otal 7.47 Tissue and other material debrided: Viable, Non-Viable, Slough, Subcutaneous, Skin: Dermis , Skin: Epidermis, Biofilm, Slough Level: Skin/Subcutaneous Tissue Debridement Description: Excisional Instrument: Curette Bleeding: Minimum Hemostasis Achieved: Pressure End Time: 10:21 Procedural Pain: 0 Post Procedural Pain: 0 Response to Treatment: Procedure was tolerated  well Level of Consciousness (Post- Awake and Alert procedure): Post Debridement Measurements of Total Wound Length: (cm) 3.4 Stage: Category/Stage IV Width: (cm) 2.8 Depth: (cm) 1.3 Volume: (cm) 9.72 Character of Wound/Ulcer Post Debridement: Improved [name] ([id number]) [phone].pdf Page 2 of 7 Post Procedure Diagnosis Same as Pre-procedure Electronic Signature(s) Signed: 11/13/2022 3:27:24 PM By: Shawn Stall RN, BSN Signed: 11/14/2022 5:52:49 PM By: Allen Derry PA-C Entered By: Shawn Stall on 11/13/2022 10:21:31 -------------------------------------------------------------------------------- HPI Details Patient Name: Date of Service: [name], [name] NA 11/13/2022 9:30 A M Medical Record Number: [id number] Patient Account Number: [id number] Date of Birth/Sex: Treating RN: 04-Aug-1945 (77 y.o. F) Primary Care Provider: Daphine Deutscher, Mary-Margaret Other Clinician: Referring Provider: Treating Provider/Extender: Lauretta Grill, Mary-Margaret Weeks in Treatment: 4 History of Present Illness HPI Description: 10-16-2022 upon evaluation today patient appears to be doing somewhat poorly in regard to a wound in the sacral area/lower back region. Fortunately there does not appear to be any signs of infection at this point which is great news. With that being said she unfortunately did develop this wound as a result of having had a hospital stay which dates back to looks like at least the beginning of April where there was unstageable wound noted. It was between May 2 and 7 that she fell and broke her shoulder and was put in a sling that she had C. difficile infection and subsequently this is when the wound was mostly described. Fortunately there does not appear to be any signs of infection locally or systemically which is great news but at the same time she does have a significantly deep wound that is of concern at this point. That is the reason she comes in for  evaluation to see if there is anything we can offer or recommend to get this healed faster. Patient does have a history of COPD, hypertension, chronic kidney disease stage III, atrial fibrillation, and vascular dementia although there is no behavioral disturbance that I see or perceived that she seems to be answering questions appropriately and asking good questions to be honest. 10-30-2022 upon evaluation  today patient appears to be doing well currently in regard to her wound which appears to be healthy at this point. I do not see any signs of active infection locally nor systemically which is great news. No fevers, chills, nausea, vomiting, or diarrhea. With that being said I am actually not seeing much improvement compared to last time I saw her simply due to the fact that she has not had the wound VAC on she was seen on the 22nd and then subsequently ended up being admitted to the hospital on the 25th due to a blood clot in her leg. This ended with therapy in the hospital through what appears to be the third so she is going to back to the facility for 2 days before coming in today and then subsequently the wound VAC was not used upon readmission. We did have a letter from the treatment nurse that stated the patient transfers independently multiple times throughout the day. Obviously based on the weakness and what I am seeing it took to individuals my nurse and the individual with her from the facility to move her into the bed today safely. I do not think she needs to be transferring independently and I think she may need a sitter in fact we might even need to do this long-term while she is on the wound VAC in order to ensure that she does not try to get up on her own and in the process hurt herself. Fortunately I do not see any signs of active infection locally nor systemically which is great news. I do still think a wound VAC would be the primary best way to get this closed as quickly as  possible. 11-13-2022 upon evaluation patient appears to be doing better in regard to her wounds from a size perspective although are still having a lot of issues here with the facility putting on the wound VAC appropriately. They continue to not really do this appropriately which has been the primary concern. In fact because of that it is not really functioning properly and continues to not do as well as we really think it should be. Nonetheless we will going to see about having [employer] go out and do some training with regard to placement of wound vacs. Electronic Signature(s) Signed: 11/19/2022 7:52:43 AM By: Allen Derry PA-C Entered By: Allen Derry on 11/19/2022 07:52:43 -------------------------------------------------------------------------------- Physical Exam Details Patient Name: Date of Service: [name], [name] NA 11/13/2022 9:30 A M Medical Record Number: [id number] Patient Account Number: [id number] Date of Birth/Sex: Treating RN: 07-28-1945 (77 y.o. F) Primary Care Provider: Daphine Deutscher, Mary-Margaret Other Clinician: Referring Provider: Treating Provider/Extender: Lauretta Grill, Mary-Margaret Weeks in Treatment: 4 Constitutional [name], [name] ([id number]) [id number] Page 3 of 7 Well-nourished and well-hydrated in no acute distress. Respiratory normal breathing without difficulty. Psychiatric this patient is able to make decisions and demonstrates good insight into disease process. Alert and Oriented x 3. pleasant and cooperative. Notes Upon inspection patient's wound bed actually showed signs of the need for sharp debridement I did perform debridement today clearway the necrotic debris patient tolerated this today without complication and postdebridement the wound bed actually showed signs of doing significantly better compared to where we were previous. I am very pleased in that regard. Electronic Signature(s) Signed: 11/19/2022 7:53:28 AM By: Allen Derry PA-C Entered By: Allen Derry on 11/19/2022 07:53:28 -------------------------------------------------------------------------------- Physician Orders Details Patient Name: Date of Service: [name], [name] NA 11/13/2022 9:30 A M Medical Record Number: [id number] Patient Account Number: [id number] Date  of Birth/Sex: Treating RN: 21-Jun-1945 (77 y.o. Arta Silence Primary Care Provider: Daphine Deutscher, Mary-Margaret Other Clinician: Referring Provider: Treating Provider/Extender: Lauretta Grill, Mary-Margaret Weeks in Treatment: 4 Verbal / Phone Orders: No Diagnosis Coding ICD-10 Coding Code Description L89.154 Pressure ulcer of sacral region, stage 4 J44.9 Chronic obstructive pulmonary disease, unspecified I10 Essential (primary) hypertension N18.30 Chronic kidney disease, stage 3 unspecified I48.0 Paroxysmal atrial fibrillation F01.A0 Vascular dementia, mild, without behavioral disturbance, psychotic disturbance, mood disturbance, and anxiety Follow-up Appointments ppointment in 1 week. Leonard Schwartz 1610 11/20/2022 room 8 Wednesday Return A Other: - Patient to only get up with assist when transferring to bed, chair, and restroom due to the wound vac tubing. continue using wheelchair cushion when in wheelchair. patient to minimize being up in wheelchair. 2 hours in the chair then 1 hour in the bed. Facility to have patient a sitter to aid in transferring with scheduling in the bed and chair. Also sitter closely monitoring with the wound vac tubing. Will reach out to [facility] company to schedule additional training for wound vac application. Anesthetic (In clinic) Topical Lidocaine 4% applied to wound bed Negative Presssure Wound Therapy Wound #1 Sacrum Wound Vac to wound continuously at 133mm/hg pressure - Facility to place wound vac and bridge to hip with each dressing change. Change 3x a week. Black Foam - bridge black foam to hip. Off-Loading Low air-loss mattress (Group 2) - facility to  ensure patient is using a group 2 air mattress. Turn and reposition every 2 hours Other: - continue using wheelchair cushion when in wheelchair. patient to minimize being up in wheelchair. 2 hours in the chair then 1 hour in the bed. Facility to have patient a sitter to aid in transferring with scheduling in the bed and chair. Also sitter closely monitoring with the wound vac tubing. Additional Orders / Instructions Follow Nutritious Diet - increase protein to aid in wound healing. Juven Shake 1-2 times daily. Non Wound Condition Bilateral Lower Extremities ZAAKIRAH, KISTNER ([id number]) [id number] Page 4 of 7 Other Non Wound Condition Orders/Instructions: - wrap both legs with unna boots change 3x a week. Wound Treatment Wound #1 - Sacrum Cleanser: Wound Cleanser 3 x Per Week/30 Days Discharge Instructions: Cleanse the wound with wound cleanser prior to applying a clean dressing using gauze sponges, not tissue or cotton balls. Peri-Wound Care: Skin Prep 3 x Per Week/30 Days Discharge Instructions: Use skin prep as directed Prim Dressing: wound vac 3 x Per Week/30 Days ary Discharge Instructions: black foam bridge to left hip. Electronic Signature(s) Signed: 11/13/2022 3:27:24 PM By: Shawn Stall RN, BSN Signed: 11/14/2022 5:52:49 PM By: Allen Derry PA-C Entered By: Shawn Stall on 11/13/2022 10:47:55 -------------------------------------------------------------------------------- Problem List Details Patient Name: Date of Service: [name], [name] NA 11/13/2022 9:30 A M Medical Record Number: [id number] Patient Account Number: [id number] Date of Birth/Sex: Treating RN: 07-26-1945 (77 y.o. Arta Silence Primary Care Provider: Daphine Deutscher, Mary-Margaret Other Clinician: Referring Provider: Treating Provider/Extender: Lauretta Grill, Mary-Margaret Weeks in Treatment: 4 Active Problems ICD-10 Encounter Code Description Active Date  MDM Diagnosis L89.154 Pressure ulcer of sacral region, stage 4 10/16/2022 No Yes J44.9 Chronic obstructive pulmonary disease, unspecified 10/16/2022 No Yes I10 Essential (primary) hypertension 10/16/2022 No Yes N18.30 Chronic kidney disease, stage 3 unspecified 10/16/2022 No Yes I48.0 Paroxysmal atrial fibrillation 10/16/2022 No Yes F01.A0 Vascular dementia, mild, without behavioral disturbance, psychotic 10/16/2022 No Yes disturbance, mood disturbance, and anxiety Inactive Problems Resolved Problems Electronic Signature(s) Signed: 11/13/2022 10:14:24 AM By: Allen Derry  [place], [name] ([id number]) [id number] Page 5 of 7 Entered By: Allen Derry on 11/13/2022 10:14:23 -------------------------------------------------------------------------------- Progress Note Details Patient Name: Date of Service: [name], [name] NA 11/13/2022 9:30 A M Medical Record Number: [id number] Patient Account Number: [id number] Date of Birth/Sex: Treating RN: 1945-06-21 (77 y.o. F) Primary Care Provider: Daphine Deutscher, Mary-Margaret Other Clinician: Referring Provider: Treating Provider/Extender: Lauretta Grill, Mary-Margaret Weeks in Treatment: 4 Subjective Chief Complaint Information obtained from Patient Sacral pressure ulcer History of Present Illness (HPI) 10-16-2022 upon evaluation today patient appears to be doing somewhat poorly in regard to a wound in the sacral area/lower back region. Fortunately there does not appear to be any signs of infection at this point which is great news. With that being said she unfortunately did develop this wound as a result of having had a hospital stay which dates back to looks like at least the beginning of April where there was unstageable wound noted. It was between May 2 and 7 that she fell and broke her shoulder and was put in a sling that she had C. difficile infection and subsequently this is when the wound was mostly described. Fortunately  there does not appear to be any signs of infection locally or systemically which is great news but at the same time she does have a significantly deep wound that is of concern at this point. That is the reason she comes in for evaluation to see if there is anything we can offer or recommend to get this healed faster. Patient does have a history of COPD, hypertension, chronic kidney disease stage III, atrial fibrillation, and vascular dementia although there is no behavioral disturbance that I see or perceived that she seems to be answering questions appropriately and asking good questions to be honest. 10-30-2022 upon evaluation today patient appears to be doing well currently in regard to her wound which appears to be healthy at this point. I do not see any signs of active infection locally nor systemically which is great news. No fevers, chills, nausea, vomiting, or diarrhea. With that being said I am actually not seeing much improvement compared to last time I saw her simply due to the fact that she has not had the wound VAC on she was seen on the 22nd and then subsequently ended up being admitted to the hospital on the 25th due to a blood clot in her leg. This ended with therapy in the hospital through what appears to be the third so she is going to back to the facility for 2 days before coming in today and then subsequently the wound VAC was not used upon readmission. We did have a letter from the treatment nurse that stated the patient transfers independently multiple times throughout the day. Obviously based on the weakness and what I am seeing it took to individuals my nurse and the individual with her from the facility to move her into the bed today safely. I do not think she needs to be transferring independently and I think she may need a sitter in fact we might even need to do this long-term while she is on the wound VAC in order to ensure that she does not try to get up on her own and in the  process hurt herself. Fortunately I do not see any signs of active infection locally nor systemically which is great news. I do still think a wound VAC would be the primary best way to get this closed as quickly as possible. 11-13-2022 upon  evaluation patient appears to be doing better in regard to her wounds from a size perspective although are still having a lot of issues here with the facility putting on the wound VAC appropriately. They continue to not really do this appropriately which has been the primary concern. In fact because of that it is not really functioning properly and continues to not do as well as we really think it should be. Nonetheless we will going to see about having [employer] go out and do some training with regard to placement of wound vacs. Objective Constitutional Well-nourished and well-hydrated in no acute distress. Vitals Time Taken: 9:39 AM, Height: 62 in, Weight: 139 lbs, BMI: 25.4, Temperature: 98.7 F, Pulse: 64 bpm, Respiratory Rate: 18 breaths/min, Blood Pressure: 136/78 mmHg. Respiratory normal breathing without difficulty. Psychiatric this patient is able to make decisions and demonstrates good insight into disease process. Alert and Oriented x 3. pleasant and cooperative. General Notes: Upon inspection patient's wound bed actually showed signs of the need for sharp debridement I did perform debridement today clearway the necrotic debris patient tolerated this today without complication and postdebridement the wound bed actually showed signs of doing significantly better compared to where we were previous. I am very pleased in that regard. Integumentary (Hair, Skin) Wound #1 status is Open. Original cause of wound was Pressure Injury. The date acquired was: 08/26/2022. The wound has been in treatment 4 weeks. The wound is located on the Sacrum. The wound measures 3.4cm length x 2.8cm width x 1.3cm depth; 7.477cm^2 area and 9.72cm^3 volume. There is muscle and Fat  Layer (Subcutaneous Tissue) exposed. There is no tunneling noted, however, there is undermining starting at 5:00 and ending at 12:00 with a maximum [place], [name] ([id number]) [id number] Page 6 of 7 distance of 2cm. There is a medium amount of serosanguineous drainage noted. The wound margin is thickened. There is small (1-33%) red, pink granulation within the wound bed. There is a large (67-100%) amount of necrotic tissue within the wound bed including Adherent Slough. The periwound skin appearance did not exhibit: Callus, Crepitus, Excoriation, Induration, Rash, Scarring, Dry/Scaly, Maceration, Atrophie Blanche, Cyanosis, Ecchymosis, Hemosiderin Staining, Mottled, Pallor, Rubor, Erythema. Assessment Active Problems ICD-10 Pressure ulcer of sacral region, stage 4 Chronic obstructive pulmonary disease, unspecified Essential (primary) hypertension Chronic kidney disease, stage 3 unspecified Paroxysmal atrial fibrillation Vascular dementia, mild, without behavioral disturbance, psychotic disturbance, mood disturbance, and anxiety Procedures Wound #1 Pre-procedure diagnosis of Wound #1 is a Pressure Ulcer located on the Sacrum . There was a Excisional Skin/Subcutaneous Tissue Debridement with a total area of 7.47 sq cm performed by Cindy Kelp, PA. With the following instrument(s): Curette to remove Viable and Non-Viable tissue/material. Material removed includes Subcutaneous Tissue, Slough, Skin: Dermis, Skin: Epidermis, and Biofilm after achieving pain control using Lidocaine 5% topical ointment. A time out was conducted at 10:15, prior to the start of the procedure. A Minimum amount of bleeding was controlled with Pressure. The procedure was tolerated well with a pain level of 0 throughout and a pain level of 0 following the procedure. Post Debridement Measurements: 3.4cm length x 2.8cm width x 1.3cm depth; 9.72cm^3 volume. Post debridement Stage noted as  Category/Stage IV. Character of Wound/Ulcer Post Debridement is improved. Post procedure Diagnosis Wound #1: Same as Pre-Procedure Plan Follow-up Appointments: Return Appointment in 1 week. Leonard Schwartz 4098 11/20/2022 room 8 Wednesday Other: - Patient to only get up with assist when transferring to bed, chair, and restroom due to the wound vac tubing. continue  using wheelchair cushion when in wheelchair. patient to minimize being up in wheelchair. 2 hours in the chair then 1 hour in the bed. Facility to have patient a sitter to aid in transferring with scheduling in the bed and chair. Also sitter closely monitoring with the wound vac tubing. Will reach out to [facility] company to schedule additional training for wound vac application. Anesthetic: (In clinic) Topical Lidocaine 4% applied to wound bed Negative Presssure Wound Therapy: Wound #1 Sacrum: Wound Vac to wound continuously at 135mm/hg pressure - Facility to place wound vac and bridge to hip with each dressing change. Change 3x a week. Black Foam - bridge black foam to hip. Off-Loading: Low air-loss mattress (Group 2) - facility to ensure patient is using a group 2 air mattress. Turn and reposition every 2 hours Other: - continue using wheelchair cushion when in wheelchair. patient to minimize being up in wheelchair. 2 hours in the chair then 1 hour in the bed. Facility to have patient a sitter to aid in transferring with scheduling in the bed and chair. Also sitter closely monitoring with the wound vac tubing. Additional Orders / Instructions: Follow Nutritious Diet - increase protein to aid in wound healing. Juven Shake 1-2 times daily. Non Wound Condition: Other Non Wound Condition Orders/Instructions: - wrap both legs with unna boots change 3x a week. WOUND #1: - Sacrum Wound Laterality: Cleanser: Wound Cleanser 3 x Per Week/30 Days Discharge Instructions: Cleanse the wound with wound cleanser prior to applying a clean dressing using gauze  sponges, not tissue or cotton balls. Peri-Wound Care: Skin Prep 3 x Per Week/30 Days Discharge Instructions: Use skin prep as directed Prim Dressing: wound vac 3 x Per Week/30 Days ary Discharge Instructions: black foam bridge to left hip. 1. Based on what I am seeing I do believe that the patient would benefit currently from initiating therapy here with the continuation of the wound VAC we did apply it today in the office and were able to get a good seal with that being said I am not sure the machine itself is functioning quite right. It seems like there could be a leak at the location where it attaches the suction container. 2. I am good recommend as well the patient should continue to monitor for any signs of infection or worsening. Overall based on what I am seeing I see no signs of infection postdebridement the wound bed looks better but nonetheless I think the wound VAC is still the best bet to try to get this healed and moving in the appropriate direction. We will see patient back for reevaluation in 1 week here in the clinic. If anything worsens or changes patient will contact our office for additional recommendations. [name], [name] ([id number]) [id number] Page 7 of 7 Electronic Signature(s) Signed: 11/19/2022 7:54:09 AM By: Allen Derry PA-C Entered By: Allen Derry on 11/19/2022 07:54:09 -------------------------------------------------------------------------------- SuperBill Details Patient Name: Date of Service: [name], SCRIVNER NA 11/13/2022 Medical Record Number: [id number] Patient Account Number: [id number] Date of Birth/Sex: Treating RN: November 22, 1945 (77 y.o. Arta Silence Primary Care Provider: Daphine Deutscher, Mary-Margaret Other Clinician: Referring Provider: Treating Provider/Extender: Lauretta Grill, Mary-Margaret Weeks in Treatment: 4 Diagnosis Coding ICD-10 Codes Code Description L89.154 Pressure ulcer of sacral region, stage 4 J44.9  Chronic obstructive pulmonary disease, unspecified I10 Essential (primary) hypertension N18.30 Chronic kidney disease, stage 3 unspecified I48.0 Paroxysmal atrial fibrillation F01.A0 Vascular dementia, mild, without behavioral disturbance, psychotic disturbance, mood disturbance, and anxiety Facility Procedures : CPT4 Code: [id number] Description:  11042 - DEB SUBQ TISSUE 20 SQ CM/< ICD-10 Diagnosis Description L89.154 Pressure ulcer of sacral region, stage 4 Modifier: Quantity: 1 : CPT4 Code: 16109604 Description: 97605 - WOUND VAC-50 SQ CM OR LESS Modifier: 59 Quantity: 1 Physician Procedures : CPT4 Code Description Modifier [id number] [address] - WC PHYS SUBQ TISS 20 SQ CM ICD-10 Diagnosis Description L89.154 Pressure ulcer of sacral region, stage 4 Quantity: 1 Electronic Signature(s) Signed: 11/19/2022 7:54:19 AM By: Allen Derry PA-C Previous Signature: 11/13/2022 3:27:24 PM Version By: Shawn Stall RN, BSN Previous Signature: 11/14/2022 5:52:49 PM Version By: Allen Derry PA-C Entered By: Allen Derry on 11/19/2022 07:54:19

## 2022-11-15 DIAGNOSIS — I890 Lymphedema, not elsewhere classified: Secondary | ICD-10-CM | POA: Diagnosis not present

## 2022-11-15 DIAGNOSIS — I872 Venous insufficiency (chronic) (peripheral): Secondary | ICD-10-CM | POA: Diagnosis not present

## 2022-11-15 DIAGNOSIS — Z79899 Other long term (current) drug therapy: Secondary | ICD-10-CM | POA: Diagnosis not present

## 2022-11-15 NOTE — Progress Notes (Signed)
KELYSE, PASK ([id number]) [id number] Page 1 of 7 Visit Report for 11/13/2022 Arrival Information Details Patient Name: Date of Service: OFFIE, PICKRON NA 11/13/2022 9:30 A M Medical Record Number: [id number] Patient Account Number: [id number] Date of Birth/Sex: Treating RN: [birth year] (77 y.o. F) Primary Care Kinston Magnan: Daphine Deutscher, Mary-Margaret Other Clinician: Referring Shanigua Gibb: Treating Jadyn Brasher/Extender: Lauretta Grill, Mary-Margaret Weeks in Treatment: 4 Visit Information History Since Last Visit Added or deleted any medications: No Patient Arrived: Wheel Chair Any new allergies or adverse reactions: No Arrival Time: 09:37 Had a fall or experienced change in No Accompanied By: self activities of daily living that may affect Transfer Assistance: None risk of falls: Patient Identification Verified: Yes Signs or symptoms of abuse/neglect since last visito No Secondary Verification Process Completed: Yes Hospitalized since last visit: No Patient Requires Transmission-Based Precautions: No Implantable device outside of the clinic excluding No Patient Has Alerts: No cellular tissue based products placed in the center since last visit: Has Dressing in Place as Prescribed: No Pain Present Now: No Notes received a small note on the patient's paperwork noting, "wound vac is not working." Taavi Hoose made aware. Electronic Signature(s) Signed: 11/13/2022 3:27:24 PM By: Shawn Stall RN, BSN Entered By: Shawn Stall on 11/13/2022 11:06:16 -------------------------------------------------------------------------------- Encounter Discharge Information Details Patient Name: Date of Service: RAMANDA, PAULES NA 11/13/2022 9:30 A M Medical Record Number: [id number] Patient Account Number: [id number] Date of Birth/Sex: Treating RN: 1945/12/14 (77 y.o. Arta Silence Primary Care Nekia Maxham: Daphine Deutscher, Mary-Margaret Other Clinician: Referring Eann Cleland: Treating  Dezarai Prew/Extender: Lauretta Grill, Mary-Margaret Weeks in Treatment: 4 Encounter Discharge Information Items Post Procedure Vitals Discharge Condition: Stable Temperature (F): 98.7 Ambulatory Status: Wheelchair Pulse (bpm): 64 Discharge Destination: Home Respiratory Rate (breaths/min): 18 Transportation: Private Auto Blood Pressure (mmHg): 136/78 Accompanied By: caregiver Schedule Follow-up Appointment: Yes Clinical Summary of Care: Notes PA and nurse noted machine is making a noise, check for leaks- no leaks noted, pushed the canister against the machine no more noise, unable to change the canister in clinic- no canister to fit the machine here. T the transporter CNA to have the nurse change the canister. Also noted the wound vac setting were old intermittent, changed that to continuous, maybe the issue. Phoned the rep of [employer] Dawn explained worrisome facility machine may need replacing and facility training with vacs needed. Per Dawn would reach to [place] at [facility]. Electronic Signature(s) Signed: 11/13/2022 3:27:24 PM By: Shawn Stall RN, BSN Petite, [name] 11/13/2022 3:27:24 PM By: Shawn Stall RN, BSN Signed: ([id number]) [id number] Page 2 of 7 Entered By: Shawn Stall on 11/13/2022 11:05:45 -------------------------------------------------------------------------------- Lower Extremity Assessment Details Patient Name: Date of Service: DENESHA, BROUSE NA 11/13/2022 9:30 A M Medical Record Number: [id number] Patient Account Number: [id number] Date of Birth/Sex: Treating RN: [birth year] (77 y.o. F) Primary Care Amari Burnsworth: Daphine Deutscher, Mary-Margaret Other Clinician: Referring Evelina Lore: Treating Ruthanna Macchia/Extender: Lauretta Grill, Mary-Margaret Weeks in Treatment: 4 Electronic Signature(s) Signed: 11/15/2022 2:31:37 PM By: Thayer Dallas Entered By: Thayer Dallas on 11/13/2022  09:40:23 -------------------------------------------------------------------------------- Multi-Disciplinary Care Plan Details Patient Name: Date of Service: JAMILAH, JEAN NA 11/13/2022 9:30 A M Medical Record Number: [id number] Patient Account Number: [id number] Date of Birth/Sex: Treating RN: 1945/05/28 (77 y.o. Arta Silence Primary Care Cyndal Kasson: Daphine Deutscher, Mary-Margaret Other Clinician: Referring Beckhem Isadore: Treating Eren Ryser/Extender: Lauretta Grill, Mary-Margaret Weeks in Treatment: 4 Active Inactive Necrotic Tissue Nursing Diagnoses: Knowledge deficit related to management of necrotic/devitalized tissue Goals: Necrotic/devitalized tissue will be minimized in the wound bed Date Initiated: 10/16/2022 Target Resolution Date:  12/20/2022 Goal Status: Active Patient/caregiver will verbalize understanding of reason and process for debridement of necrotic tissue Date Initiated: 10/16/2022 Target Resolution Date: 12/20/2022 Goal Status: Active Interventions: Assess patient pain level pre-, during and post procedure and prior to discharge Provide education on necrotic tissue and debridement process Treatment Activities: Apply topical anesthetic as ordered : 10/16/2022 Enzymatic debridement : 10/16/2022 Notes: Pain, Acute or Chronic Nursing Diagnoses: Pain, acute or chronic: actual or potential Potential alteration in comfort, pain Goals: Patient will verbalize adequate pain control and receive pain control interventions during procedures as needed CARLETHA, DAWN ([id number]) [phone].pdf Page 3 of 7 Date Initiated: 10/16/2022 Target Resolution Date: 12/20/2022 Goal Status: Active Patient/caregiver will verbalize comfort level met Date Initiated: 10/16/2022 Target Resolution Date: 12/27/2022 Goal Status: Active Interventions: Assess comfort goal upon admission Complete pain assessment as per visit requirements Provide education on pain management Treatment  Activities: Administer pain control measures as ordered : 10/16/2022 Notes: Pressure Nursing Diagnoses: Knowledge deficit related to management of pressures ulcers Goals: Patient will remain free of pressure ulcers Date Initiated: 10/16/2022 Target Resolution Date: 12/20/2022 Goal Status: Active Interventions: Assess: immobility, friction, shearing, incontinence upon admission and as needed Assess offloading mechanisms upon admission and as needed Assess potential for pressure ulcer upon admission and as needed Provide education on pressure ulcers Notes: Wound/Skin Impairment Nursing Diagnoses: Knowledge deficit related to ulceration/compromised skin integrity Goals: Patient/caregiver will verbalize understanding of skin care regimen Date Initiated: 10/16/2022 Target Resolution Date: 12/20/2022 Goal Status: Active Interventions: Assess patient/caregiver ability to perform ulcer/skin care regimen upon admission and as needed Assess ulceration(s) every visit Provide education on ulcer and skin care Treatment Activities: Skin care regimen initiated : 10/16/2022 Topical wound management initiated : 10/16/2022 Notes: Electronic Signature(s) Signed: 11/13/2022 3:27:24 PM By: Shawn Stall RN, BSN Entered By: Shawn Stall on 11/13/2022 09:40:04 -------------------------------------------------------------------------------- Negative Pressure Wound Therapy Application (NPWT) Details Patient Name: Date of Service: MERRILY, TEGELER NA 11/13/2022 9:30 A M Medical Record Number: [id number] Patient Account Number: [id number] Date of Birth/Sex: Treating RN: [birth year] (77 y.o. Arta Silence Primary Care Spenser Cong: Daphine Deutscher, Mary-Margaret Other Clinician: Referring Mansel Strother: Treating Keyla Milone/Extender: Lauretta Grill, Mary-Margaret Weeks in Treatment: 4 NPWT Application Performed for: Wound #1 Sacrum Performed By: Shawn Stall, RN Samuella Cota, [name] ([id number])  [id number] Page 4 of 7 Type: VAC System Coverage Size (sq cm): 9.52 Pressure Type: Constant Pressure Setting: 125 mmHG Drain Type: None Primary Contact: Non-Adherent Quantity of Sponges/Gauze Inserted: x1 black foam bridge to left hip. Sponge/Dressing Type: Foam- Black Date Initiated: 10/16/2022 Response to Treatment: tolerated well Post Procedure Diagnosis Same as Pre-procedure Electronic Signature(s) Signed: 11/13/2022 3:27:24 PM By: Shawn Stall RN, BSN Entered By: Shawn Stall on 11/13/2022 10:20:29 -------------------------------------------------------------------------------- Pain Assessment Details Patient Name: Date of Service: AVON, MOLOCK NA 11/13/2022 9:30 A M Medical Record Number: [id number] Patient Account Number: [id number] Date of Birth/Sex: Treating RN: 05/19/1946 (77 y.o. F) Primary Care Vi Whitesel: Daphine Deutscher, Mary-Margaret Other Clinician: Referring Wendi Lastra: Treating Gwendloyn Forsee/Extender: Lauretta Grill, Mary-Margaret Weeks in Treatment: 4 Active Problems Location of Pain Severity and Description of Pain Patient Has Paino No Site Locations Pain Management and Medication Current Pain Management: Electronic Signature(s) Signed: 11/15/2022 2:31:37 PM By: Thayer Dallas Entered By: Thayer Dallas on 11/13/2022 09:40:17 Pellman, [name] ([id number]) [id number] Page 5 of 7 -------------------------------------------------------------------------------- Patient/Caregiver Education Details Patient Name: Date of Service: YETTA, MARCEAUX NA 6/19/2024andnbsp9:30 A M Medical Record Number: [id number] Patient Account Number: [id number] Date of Birth/Gender: Treating RN: [birth year] (77 y.o. Arta Silence Primary Care Physician: Daphine Deutscher, Mary-Margaret  Other Clinician: Referring Physician: Treating Physician/Extender: Lauretta Grill, Mary-Margaret Weeks in Treatment: 4 Education Assessment Education Provided  To: Patient Education Topics Provided Wound/Skin Impairment: Handouts: Caring for Your Ulcer Methods: Explain/Verbal Responses: Reinforcements needed Electronic Signature(s) Signed: 11/13/2022 3:27:24 PM By: Shawn Stall RN, BSN Entered By: Shawn Stall on 11/13/2022 09:40:16 -------------------------------------------------------------------------------- Wound Assessment Details Patient Name: Date of Service: AYELEN, SCIORTINO NA 11/13/2022 9:30 A M Medical Record Number: [id number] Patient Account Number: [id number] Date of Birth/Sex: Treating RN: April 16, 1946 (77 y.o. F) Primary Care Zamorah Ailes: Daphine Deutscher, Mary-Margaret Other Clinician: Referring Olivianna Higley: Treating Cathlin Buchan/Extender: Lauretta Grill, Mary-Margaret Weeks in Treatment: 4 Wound Status Wound Number: 1 Primary Pressure Ulcer Etiology: Wound Location: Sacrum Wound Open Wounding Event: Pressure Injury Status: Date Acquired: 08/26/2022 Comorbid Chronic Obstructive Pulmonary Disease (COPD), Congestive Weeks Of Treatment: 4 History: Heart Failure, Coronary Artery Disease, Deep Vein Thrombosis, Clustered Wound: No Hypertension, Peripheral Venous Disease, Osteoarthritis, Dementia Photos Wound Measurements Length: (cm) 3.4 Width: (cm) 2.8 Depth: (cm) 1.3 Area: (cm) 7.477 Volume: (cm) 9.72 Schirtzinger, Philomina ([id number]) % Reduction in Area: 59.5% % Reduction in Volume: 69% Epithelialization: Small (1-33%) Tunneling: No Undermining: Yes Starting Position (o'clock): 5 Ending Position (o'clock): 12 [id number] Page 6 of 7 Maximum Distance: (cm) 2 Wound Description Classification: Category/Stage IV Wound Margin: Thickened Exudate Amount: Medium Exudate Type: Serosanguineous Exudate Color: red, brown Foul Odor After Cleansing: No Slough/Fibrino Yes Wound Bed Granulation Amount: Small (1-33%) Exposed Structure Granulation Quality: Red, Pink Fascia Exposed: No Necrotic Amount: Large  (67-100%) Fat Layer (Subcutaneous Tissue) Exposed: Yes Necrotic Quality: Adherent Slough Tendon Exposed: No Muscle Exposed: Yes Necrosis of Muscle: No Joint Exposed: No Bone Exposed: No Periwound Skin Texture Texture Color No Abnormalities Noted: No No Abnormalities Noted: No Callus: No Atrophie Blanche: No Crepitus: No Cyanosis: No Excoriation: No Ecchymosis: No Induration: No Erythema: No Rash: No Hemosiderin Staining: No Scarring: No Mottled: No Pallor: No Moisture Rubor: No No Abnormalities Noted: No Dry / Scaly: No Maceration: No Treatment Notes Wound #1 (Sacrum) Cleanser Wound Cleanser Discharge Instruction: Cleanse the wound with wound cleanser prior to applying a clean dressing using gauze sponges, not tissue or cotton balls. Peri-Wound Care Skin Prep Discharge Instruction: Use skin prep as directed Topical Primary Dressing wound vac Discharge Instruction: black foam bridge to left hip. Secondary Dressing Secured With Compression Wrap Compression Stockings Add-Ons Electronic Signature(s) Signed: 11/15/2022 2:31:37 PM By: Thayer Dallas Entered By: Thayer Dallas on 11/13/2022 10:10:41 Stembridge, [name] ([id number]) [id number] Page 7 of 7 -------------------------------------------------------------------------------- Vitals Details Patient Name: Date of Service: LUANN, ASPINWALL NA 11/13/2022 9:30 A M Medical Record Number: [id number] Patient Account Number: [id number] Date of Birth/Sex: Treating RN: [birth year] (77 y.o. F) Primary Care Breion Novacek: Daphine Deutscher, Mary-Margaret Other Clinician: Referring Vannesa Abair: Treating Jermain Curt/Extender: Lauretta Grill, Mary-Margaret Weeks in Treatment: 4 Vital Signs Time Taken: 09:39 Temperature (F): 98.7 Height (in): 62 Pulse (bpm): 64 Weight (lbs): 139 Respiratory Rate (breaths/min): 18 Body Mass Index (BMI): 25.4 Blood Pressure (mmHg): 136/78 Reference Range: 80 - 120 mg /  dl Electronic Signature(s) Signed: 11/15/2022 2:31:37 PM By: Thayer Dallas Entered By: Thayer Dallas on 11/13/2022 09:40:10

## 2022-11-20 ENCOUNTER — Encounter (HOSPITAL_BASED_OUTPATIENT_CLINIC_OR_DEPARTMENT_OTHER): Payer: Medicare Other | Admitting: Physician Assistant

## 2022-11-20 DIAGNOSIS — N183 Chronic kidney disease, stage 3 unspecified: Secondary | ICD-10-CM | POA: Diagnosis not present

## 2022-11-20 DIAGNOSIS — I129 Hypertensive chronic kidney disease with stage 1 through stage 4 chronic kidney disease, or unspecified chronic kidney disease: Secondary | ICD-10-CM | POA: Diagnosis not present

## 2022-11-20 DIAGNOSIS — I2510 Atherosclerotic heart disease of native coronary artery without angina pectoris: Secondary | ICD-10-CM | POA: Diagnosis not present

## 2022-11-20 DIAGNOSIS — I480 Paroxysmal atrial fibrillation: Secondary | ICD-10-CM | POA: Diagnosis not present

## 2022-11-20 DIAGNOSIS — I130 Hypertensive heart and chronic kidney disease with heart failure and stage 1 through stage 4 chronic kidney disease, or unspecified chronic kidney disease: Secondary | ICD-10-CM | POA: Diagnosis not present

## 2022-11-20 DIAGNOSIS — L89154 Pressure ulcer of sacral region, stage 4: Secondary | ICD-10-CM | POA: Diagnosis not present

## 2022-11-20 DIAGNOSIS — I509 Heart failure, unspecified: Secondary | ICD-10-CM | POA: Diagnosis not present

## 2022-11-20 NOTE — Progress Notes (Addendum)
[name], [name] ([id number]) [id number] Page 1 of 6 Visit Report for 11/20/2022 Chief Complaint Document Details Patient Name: Date of Service: [name], [name] Saunders 11/20/2022 12:45 PM Medical Record Number: [id number] Patient Account Number: [id number] Date of Birth/Sex: Treating RN: 03-12-1946 (77 y.o. F) Primary Care Provider: Daphine Deutscher, Mary-Margaret Other Clinician: Referring Provider: Treating Provider/Extender: Lauretta Grill, Mary-Margaret Weeks in Treatment: 5 Information Obtained from: Patient Chief Complaint Sacral pressure ulcer Electronic Signature(s) Signed: 11/20/2022 1:13:58 PM By: Allen Derry PA-C Entered By: Allen Derry on 11/20/2022 13:13:57 -------------------------------------------------------------------------------- HPI Details Patient Name: Date of Service: [name], [name] 11/20/2022 12:45 PM Medical Record Number: [id number] Patient Account Number: [id number] Date of Birth/Sex: Treating RN: [birth year] (77 y.o. F) Primary Care Provider: Daphine Deutscher, Mary-Margaret Other Clinician: Referring Provider: Treating Provider/Extender: Lauretta Grill, Mary-Margaret Weeks in Treatment: 5 History of Present Illness HPI Description: 10-16-2022 upon evaluation today patient appears to be doing somewhat poorly in regard to a wound in the sacral area/lower back region. Fortunately there does not appear to be any signs of infection at this point which is great news. With that being said she unfortunately did develop this wound as a result of having had a hospital stay which dates back to looks like at least the beginning of April where there was unstageable wound noted. It was between May 2 and 7 that she fell and broke her shoulder and was put in a sling that she had C. difficile infection and subsequently this is when the wound was mostly described. Fortunately there does not appear to be any signs of infection locally or systemically which is great news  but at the same time she does have a significantly deep wound that is of concern at this point. That is the reason she comes in for evaluation to see if there is anything we can offer or recommend to get this healed faster. Patient does have a history of COPD, hypertension, chronic kidney disease stage III, atrial fibrillation, and vascular dementia although there is no behavioral disturbance that I see or perceived that she seems to be answering questions appropriately and asking good questions to be honest. 10-30-2022 upon evaluation today patient appears to be doing well currently in regard to her wound which appears to be healthy at this point. I do not see any signs of active infection locally nor systemically which is great news. No fevers, chills, nausea, vomiting, or diarrhea. With that being said I am actually not seeing much improvement compared to last time I saw her simply due to the fact that she has not had the wound VAC on she was seen on the 22nd and then subsequently ended up being admitted to the hospital on the 25th due to a blood clot in her leg. This ended with therapy in the hospital through what appears to be the third so she is going to back to the facility for 2 days before coming in today and then subsequently the wound VAC was not used upon readmission. We did have a letter from the treatment nurse that stated the patient transfers independently multiple times throughout the day. Obviously based on the weakness and what I am seeing it took to individuals my nurse and the individual with her from the facility to move her into the bed today safely. I do not think she needs to be transferring independently and I think she may need a sitter in fact we might even need to do this long-term while she is on the wound  VAC in order to ensure that she does not try to get up on her own and in the process hurt herself. Fortunately I do not see any signs of active infection locally  nor systemically which is great news. I do still think a wound VAC would be the primary best way to get this closed as quickly as possible. 11-13-2022 upon evaluation patient appears to be doing better in regard to her wounds from a size perspective although are still having a lot of issues here with the facility putting on the wound VAC appropriately. They continue to not really do this appropriately which has been the primary concern. In fact because of that it is not really functioning properly and continues to not do as well as we really think it should be. Nonetheless we will going to see about having [employer] go out and do some training with regard to placement of wound vacs. 11-20-2022 upon evaluation today patient appears to be doing well currently in regard to her wound. She is actually showing signs of improvement I am actually very pleased with where things stand I do believe that wound VAC is doing a good job here. Fortunately I do not see any evidence of active infection locally or systemically which is great news. No fevers, chills, nausea, vomiting, or diarrhea. [name], [name] ([id number]) [id number] Page 2 of 6 Electronic Signature(s) Signed: 11/20/2022 3:07:15 PM By: Allen Derry PA-C Entered By: Allen Derry on 11/20/2022 15:07:15 -------------------------------------------------------------------------------- Physical Exam Details Patient Name: Date of Service: [name], [name] 11/20/2022 12:45 PM Medical Record Number: [id number] Patient Account Number: [id number] Date of Birth/Sex: Treating RN: Apr 09, 1946 (77 y.o. F) Primary Care Provider: Daphine Deutscher, Mary-Margaret Other Clinician: Referring Provider: Treating Provider/Extender: Lauretta Grill, Mary-Margaret Weeks in Treatment: 5 Constitutional Well-nourished and well-hydrated in no acute distress. Respiratory normal breathing without difficulty. Psychiatric this patient is able to make decisions and  demonstrates good insight into disease process. Alert and Oriented x 3. pleasant and cooperative. Notes Upon inspection patient's wound bed actually showed signs of good granulation epithelization at this point. Fortunately I do not see any evidence of worsening overall and I do believe that the patient is making good progress towards closure. In general I think the we are on the right track towards closure with the wound VAC although I am unsure exactly how long this is going to take. Electronic Signature(s) Signed: 11/20/2022 3:07:28 PM By: Allen Derry PA-C Entered By: Allen Derry on 11/20/2022 15:07:28 -------------------------------------------------------------------------------- Physician Orders Details Patient Name: Date of Service: [name], [name] 11/20/2022 12:45 PM Medical Record Number: [id number] Patient Account Number: [id number] Date of Birth/Sex: Treating RN: Jul 28, 1945 (77 y.o. F) Primary Care Provider: Daphine Deutscher, Mary-Margaret Other Clinician: Referring Provider: Treating Provider/Extender: Lauretta Grill, Mary-Margaret Weeks in Treatment: 5 Clinician: Verbal / Phone Orders: Yes Read Back and Verified: Yes Diagnosis Coding ICD-10 Coding Code Description L89.154 Pressure ulcer of sacral region, stage 4 J44.9 Chronic obstructive pulmonary disease, unspecified I10 Essential (primary) hypertension N18.30 Chronic kidney disease, stage 3 unspecified I48.0 Paroxysmal atrial fibrillation F01.A0 Vascular dementia, mild, without behavioral disturbance, psychotic disturbance, mood disturbance, and anxiety Follow-up Appointments ppointment in 1 week. Leonard Schwartz Wednesday Return A Other: - Patient to only get up with assist when transferring to bed, chair, and restroom due to the wound vac tubing. continue using wheelchair cushion when in wheelchair. patient to minimize being up in wheelchair. 2 hours in the chair then 1 hour in the bed. Facility to [place], [name] ([id number])  [phone].pdf Page 3 of 6 have patient a sitter to aid in transferring with scheduling in the bed and chair. Also sitter closely monitoring with the wound vac tubing. Will reach out to [facility] company to schedule additional training for wound vac application. Anesthetic (In clinic) Topical Lidocaine 4% applied to wound bed Negative Presssure Wound Therapy Wound #1 Sacrum Wound Vac to wound continuously at 157mm/hg pressure - Facility to place wound vac and bridge to hip with each dressing change. Change 3x a week. Black Foam - bridge black foam to hip. Off-Loading Low air-loss mattress (Group 2) - facility to ensure patient is using a group 2 air mattress. Turn and reposition every 2 hours Other: - continue using wheelchair cushion when in wheelchair. patient to minimize being up in wheelchair. 2 hours in the chair then 1 hour in the bed. Facility to have patient a sitter to aid in transferring with scheduling in the bed and chair. Also sitter closely monitoring with the wound vac tubing. Additional Orders / Instructions Follow Nutritious Diet - increase protein to aid in wound healing. Juven Shake 1-2 times daily. Non Wound Condition Bilateral Lower Extremities Other Non Wound Condition Orders/Instructions: - wrap both legs with unna boots change 3x a week. Wound Treatment Wound #1 - Sacrum Cleanser: Wound Cleanser 3 x Per Week/30 Days Discharge Instructions: Cleanse the wound with wound cleanser prior to applying a clean dressing using gauze sponges, not tissue or cotton balls. Peri-Wound Care: Skin Prep 3 x Per Week/30 Days Discharge Instructions: Use skin prep as directed Prim Dressing: wound vac 3 x Per Week/30 Days ary Discharge Instructions: black foam bridge to left hip. Electronic Signature(s) Signed: 11/20/2022 4:15:09 PM By: Allen Derry PA-C Signed: 11/25/2022 2:58:52 PM By: Pearletha Alfred Entered By: Pearletha Alfred on 11/20/2022  14:08:55 -------------------------------------------------------------------------------- Problem List Details Patient Name: Date of Service: [name], [name] 11/20/2022 12:45 PM Medical Record Number: [id number] Patient Account Number: [id number] Date of Birth/Sex: Treating RN: 07/24/1945 (77 y.o. F) Primary Care Provider: Daphine Deutscher, Mary-Margaret Other Clinician: Referring Provider: Treating Provider/Extender: Lauretta Grill, Mary-Margaret Weeks in Treatment: 5 Active Problems ICD-10 Encounter Code Description Active Date MDM Diagnosis L89.154 Pressure ulcer of sacral region, stage 4 10/16/2022 No Yes J44.9 Chronic obstructive pulmonary disease, unspecified 10/16/2022 No Yes I10 Essential (primary) hypertension 10/16/2022 No Yes LADUKE, [name] ([id number]) [phone].pdf Page 4 of 6 N18.30 Chronic kidney disease, stage 3 unspecified 10/16/2022 No Yes I48.0 Paroxysmal atrial fibrillation 10/16/2022 No Yes F01.A0 Vascular dementia, mild, without behavioral disturbance, psychotic 10/16/2022 No Yes disturbance, mood disturbance, and anxiety Inactive Problems Resolved Problems Electronic Signature(s) Signed: 11/20/2022 1:13:20 PM By: Allen Derry PA-C Entered By: Allen Derry on 11/20/2022 13:13:20 -------------------------------------------------------------------------------- Progress Note Details Patient Name: Date of Service: [name], [name] 11/20/2022 12:45 PM Medical Record Number: [id number] Patient Account Number: [id number] Date of Birth/Sex: Treating RN: 05-27-1946 (77 y.o. F) Primary Care Provider: Daphine Deutscher, Mary-Margaret Other Clinician: Referring Provider: Treating Provider/Extender: Lauretta Grill, Mary-Margaret Weeks in Treatment: 5 Subjective Chief Complaint Information obtained from Patient Sacral pressure ulcer History of Present Illness (HPI) 10-16-2022 upon evaluation today patient appears to be doing somewhat poorly in regard to a wound  in the sacral area/lower back region. Fortunately there does not appear to be any signs of infection at this point which is great news. With that being said she unfortunately did develop this wound as a result of having had a hospital stay which dates back to looks like at least the beginning of April where  there was unstageable wound noted. It was between May 2 and 7 that she fell and broke her shoulder and was put in a sling that she had C. difficile infection and subsequently this is when the wound was mostly described. Fortunately there does not appear to be any signs of infection locally or systemically which is great news but at the same time she does have a significantly deep wound that is of concern at this point. That is the reason she comes in for evaluation to see if there is anything we can offer or recommend to get this healed faster. Patient does have a history of COPD, hypertension, chronic kidney disease stage III, atrial fibrillation, and vascular dementia although there is no behavioral disturbance that I see or perceived that she seems to be answering questions appropriately and asking good questions to be honest. 10-30-2022 upon evaluation today patient appears to be doing well currently in regard to her wound which appears to be healthy at this point. I do not see any signs of active infection locally nor systemically which is great news. No fevers, chills, nausea, vomiting, or diarrhea. With that being said I am actually not seeing much improvement compared to last time I saw her simply due to the fact that she has not had the wound VAC on she was seen on the 22nd and then subsequently ended up being admitted to the hospital on the 25th due to a blood clot in her leg. This ended with therapy in the hospital through what appears to be the third so she is going to back to the facility for 2 days before coming in today and then subsequently the wound VAC was not used upon  readmission. We did have a letter from the treatment nurse that stated the patient transfers independently multiple times throughout the day. Obviously based on the weakness and what I am seeing it took to individuals my nurse and the individual with her from the facility to move her into the bed today safely. I do not think she needs to be transferring independently and I think she may need a sitter in fact we might even need to do this long-term while she is on the wound VAC in order to ensure that she does not try to get up on her own and in the process hurt herself. Fortunately I do not see any signs of active infection locally nor systemically which is great news. I do still think a wound VAC would be the primary best way to get this closed as quickly as possible. 11-13-2022 upon evaluation patient appears to be doing better in regard to her wounds from a size perspective although are still having a lot of issues here with the facility putting on the wound VAC appropriately. They continue to not really do this appropriately which has been the primary concern. In fact because of that it is not really functioning properly and continues to not do as well as we really think it should be. Nonetheless we will going to see about having [employer] go out and do some training with regard to placement of wound vacs. 11-20-2022 upon evaluation today patient appears to be doing well currently in regard to her wound. She is actually showing signs of improvement I am actually very pleased with where things stand I do believe that wound VAC is doing a good job here. Fortunately I do not see any evidence of active infection locally or systemically which is great news. No  fevers, chills, nausea, vomiting, or diarrhea. [name], [name] ([id number]) [id number] Page 5 of 6 Objective Constitutional Well-nourished and well-hydrated in no acute distress. Vitals Time Taken: 1:10 AM, Height: 62 in,  Weight: 139 lbs, BMI: 25.4, Temperature: 98.6 F, Pulse: 71 bpm, Respiratory Rate: 18 breaths/min, Blood Pressure: 126/73 mmHg. Respiratory normal breathing without difficulty. Psychiatric this patient is able to make decisions and demonstrates good insight into disease process. Alert and Oriented x 3. pleasant and cooperative. General Notes: Upon inspection patient's wound bed actually showed signs of good granulation epithelization at this point. Fortunately I do not see any evidence of worsening overall and I do believe that the patient is making good progress towards closure. In general I think the we are on the right track towards closure with the wound VAC although I am unsure exactly how long this is going to take. Integumentary (Hair, Skin) Wound #1 status is Open. Original cause of wound was Pressure Injury. The date acquired was: 08/26/2022. The wound has been in treatment 5 weeks. The wound is located on the Sacrum. The wound measures 3.4cm length x 2.8cm width x 1cm depth; 7.477cm^2 area and 7.477cm^3 volume. There is muscle and Fat Layer (Subcutaneous Tissue) exposed. There is no tunneling noted, however, there is undermining starting at 1:00 and ending at 5:00 with a maximum distance of 1.1cm. There is a medium amount of serosanguineous drainage noted. The wound margin is thickened. There is small (1-33%) red, pink granulation within the wound bed. There is a large (67-100%) amount of necrotic tissue within the wound bed including Adherent Slough. The periwound skin appearance did not exhibit: Callus, Crepitus, Excoriation, Induration, Rash, Scarring, Dry/Scaly, Maceration, Atrophie Blanche, Cyanosis, Ecchymosis, Hemosiderin Staining, Mottled, Pallor, Rubor, Erythema. Assessment Active Problems ICD-10 Pressure ulcer of sacral region, stage 4 Chronic obstructive pulmonary disease, unspecified Essential (primary) hypertension Chronic kidney disease, stage 3 unspecified Paroxysmal  atrial fibrillation Vascular dementia, mild, without behavioral disturbance, psychotic disturbance, mood disturbance, and anxiety Plan Follow-up Appointments: Return Appointment in 1 week. Leonard Schwartz Wednesday Other: - Patient to only get up with assist when transferring to bed, chair, and restroom due to the wound vac tubing. continue using wheelchair cushion when in wheelchair. patient to minimize being up in wheelchair. 2 hours in the chair then 1 hour in the bed. Facility to have patient a sitter to aid in transferring with scheduling in the bed and chair. Also sitter closely monitoring with the wound vac tubing. Will reach out to [facility] company to schedule additional training for wound vac application. Anesthetic: (In clinic) Topical Lidocaine 4% applied to wound bed Negative Presssure Wound Therapy: Wound #1 Sacrum: Wound Vac to wound continuously at 164mm/hg pressure - Facility to place wound vac and bridge to hip with each dressing change. Change 3x a week. Black Foam - bridge black foam to hip. Off-Loading: Low air-loss mattress (Group 2) - facility to ensure patient is using a group 2 air mattress. Turn and reposition every 2 hours Other: - continue using wheelchair cushion when in wheelchair. patient to minimize being up in wheelchair. 2 hours in the chair then 1 hour in the bed. Facility to have patient a sitter to aid in transferring with scheduling in the bed and chair. Also sitter closely monitoring with the wound vac tubing. Additional Orders / Instructions: Follow Nutritious Diet - increase protein to aid in wound healing. Juven Shake 1-2 times daily. Non Wound Condition: Other Non Wound Condition Orders/Instructions: - wrap both legs with unna boots change 3x  a week. WOUND #1: - Sacrum Wound Laterality: Cleanser: Wound Cleanser 3 x Per Week/30 Days Discharge Instructions: Cleanse the wound with wound cleanser prior to applying a clean dressing using gauze sponges, not tissue or  cotton balls. Peri-Wound Care: Skin Prep 3 x Per Week/30 Days Discharge Instructions: Use skin prep as directed Prim Dressing: wound vac 3 x Per Week/30 Days ary Discharge Instructions: black foam bridge to left hip. [name], [name] ([id number]) [id number] Page 6 of 6 1. Based on what I am seeing I do believe that the wound VAC is making good progress towards closure in regard to the sacral wound and very pleased in that regard I would recommend we continue as such. 2. I am good recommend as well that the patient should continue to utilize the Skin-Prep around the edges of the wound with the dressings are changed obviously not doing this but is rather than a skilled nursing facility staff that should be doing this. 3. I am good recommend as well that she should continue with the offloading which I think is also going appropriately at this point. We will see patient back for reevaluation in 1 week here in the clinic. If anything worsens or changes patient will contact our office for additional recommendations. Electronic Signature(s) Signed: 11/20/2022 3:08:14 PM By: Allen Derry PA-C Entered By: Allen Derry on 11/20/2022 15:08:13 -------------------------------------------------------------------------------- SuperBill Details Patient Name: Date of Service: [name], [name] 11/20/2022 Medical Record Number: [id number] Patient Account Number: [id number] Date of Birth/Sex: Treating RN: 09-Dec-1945 (77 y.o. F) Primary Care Provider: Daphine Deutscher, Mary-Margaret Other Clinician: Referring Provider: Treating Provider/Extender: Lauretta Grill, Mary-Margaret Weeks in Treatment: 5 Diagnosis Coding ICD-10 Codes Code Description L89.154 Pressure ulcer of sacral region, stage 4 J44.9 Chronic obstructive pulmonary disease, unspecified I10 Essential (primary) hypertension N18.30 Chronic kidney disease, stage 3 unspecified I48.0 Paroxysmal atrial fibrillation F01.A0  Vascular dementia, mild, without behavioral disturbance, psychotic disturbance, mood disturbance, and anxiety Facility Procedures : CPT4 Code: [id number] Description: 97605 - WOUND VAC-50 SQ CM OR LESS ICD-10 Diagnosis Description L89.154 Pressure ulcer of sacral region, stage 4 Modifier: Quantity: 1 Physician Procedures : CPT4 Code Description Modifier [id number] - WC PHYS LEVEL 4 - EST PT ICD-10 Diagnosis Description L89.154 Pressure ulcer of sacral region, stage 4 J44.9 Chronic obstructive pulmonary disease, unspecified I10 Essential (primary) hypertension N18.30  Chronic kidney disease, stage 3 unspecified Quantity: 1 Electronic Signature(s) Signed: 11/20/2022 3:11:54 PM By: Allen Derry PA-C Entered By: Allen Derry on 11/20/2022 15:11:54

## 2022-11-25 NOTE — Progress Notes (Signed)
[name], [name] ([id number]) [id number] Page 1 of 7 Visit Report for 11/20/2022 Arrival Information Details Patient Name: Date of Service: [name], [name] 11/20/2022 12:45 PM Medical Record Number: [id number] Patient Account Number: [id number] Date of Birth/Sex: Treating RN: 1945-11-06 (77 y.o. F) Primary Care Praise Dolecki: Daphine Deutscher, Mary-Margaret Other Clinician: Referring Kennadee Walthour: Treating Jarmar Rousseau/Extender: Lauretta Grill, Mary-Margaret Weeks in Treatment: 5 Visit Information History Since Last Visit All ordered tests and consults were completed: No Patient Arrived: Wheel Chair Added or deleted any medications: No Arrival Time: 13:09 Any new allergies or adverse reactions: No Accompanied By: aide Had a fall or experienced change in No Transfer Assistance: None activities of daily living that may affect Patient Identification Verified: Yes risk of falls: Secondary Verification Process Completed: Yes Signs or symptoms of abuse/neglect since last visito No Patient Requires Transmission-Based Precautions: No Hospitalized since last visit: No Patient Has Alerts: No Implantable device outside of the clinic excluding No cellular tissue based products placed in the center since last visit: Pain Present Now: No Electronic Signature(s) Signed: 11/20/2022 4:13:56 PM By: Dayton Scrape Entered By: Dayton Scrape on 11/20/2022 13:10:09 -------------------------------------------------------------------------------- Encounter Discharge Information Details Patient Name: Date of Service: [name], [name] 11/20/2022 12:45 PM Medical Record Number: [id number] Patient Account Number: [id number] Date of Birth/Sex: Treating RN: 1945/07/09 (77 y.o. F) Primary Care Lunabelle Oatley: Daphine Deutscher, Mary-Margaret Other Clinician: Referring Lora Chavers: Treating Anjani Feuerborn/Extender: Lauretta Grill, Mary-Margaret Weeks in Treatment: 5 Encounter Discharge Information Items Discharge Condition:  Stable Ambulatory Status: Wheelchair Discharge Destination: Home Transportation: Private Auto Schedule Follow-up Appointment: Yes Clinical Summary of Care: Patient Declined Electronic Signature(s) Signed: 11/25/2022 2:58:52 PM By: Pearletha Alfred Entered By: Pearletha Alfred on 11/20/2022 14:12:31 Martinovich, [name] ([id number]) [id number] Page 2 of 7 -------------------------------------------------------------------------------- Lower Extremity Assessment Details Patient Name: Date of Service: [name], [name] 11/20/2022 12:45 PM Medical Record Number: [id number] Patient Account Number: [id number] Date of Birth/Sex: Treating RN: Oct 31, 1945 (77 y.o. F) Primary Care Murl Zogg: Daphine Deutscher, Mary-Margaret Other Clinician: Referring Maryem Shuffler: Treating Nikeia Henkes/Extender: Lauretta Grill, Mary-Margaret Weeks in Treatment: 5 Electronic Signature(s) Signed: 11/22/2022 11:18:15 AM By: Thayer Dallas Entered By: Thayer Dallas on 11/20/2022 13:43:16 -------------------------------------------------------------------------------- Multi-Disciplinary Care Plan Details Patient Name: Date of Service: [name], [name] 11/20/2022 12:45 PM Medical Record Number: [id number] Patient Account Number: [id number] Date of Birth/Sex: Treating RN: 06-02-1945 (77 y.o. F) Primary Care Charlize Hathaway: Daphine Deutscher, Mary-Margaret Other Clinician: Referring Chaos Carlile: Treating Treston Coker/Extender: Lauretta Grill, Mary-Margaret Weeks in Treatment: 5 Multidisciplinary Care Plan reviewed with physician Active Inactive Necrotic Tissue Nursing Diagnoses: Knowledge deficit related to management of necrotic/devitalized tissue Goals: Necrotic/devitalized tissue will be minimized in the wound bed Date Initiated: 10/16/2022 Target Resolution Date: 12/20/2022 Goal Status: Active Patient/caregiver will verbalize understanding of reason and process for debridement of necrotic tissue Date Initiated: 10/16/2022 Target  Resolution Date: 12/20/2022 Goal Status: Active Interventions: Assess patient pain level pre-, during and post procedure and prior to discharge Provide education on necrotic tissue and debridement process Treatment Activities: Apply topical anesthetic as ordered : 10/16/2022 Enzymatic debridement : 10/16/2022 Notes: Pain, Acute or Chronic Nursing Diagnoses: Pain, acute or chronic: actual or potential Potential alteration in comfort, pain Goals: Patient will verbalize adequate pain control and receive pain control interventions during procedures as needed Date Initiated: 10/16/2022 Target Resolution Date: 12/20/2022 Goal Status: Active Patient/caregiver will verbalize comfort level met [place], [name] ([id number]) [id number] Page 3 of 7 Date Initiated: 10/16/2022 Target Resolution Date: 12/27/2022 Goal Status: Active Interventions: Assess comfort goal upon admission Complete pain assessment as per visit requirements Provide  education on pain management Treatment Activities: Administer pain control measures as ordered : 10/16/2022 Notes: Pressure Nursing Diagnoses: Knowledge deficit related to management of pressures ulcers Goals: Patient will remain free of pressure ulcers Date Initiated: 10/16/2022 Target Resolution Date: 12/20/2022 Goal Status: Active Interventions: Assess: immobility, friction, shearing, incontinence upon admission and as needed Assess offloading mechanisms upon admission and as needed Assess potential for pressure ulcer upon admission and as needed Provide education on pressure ulcers Notes: Wound/Skin Impairment Nursing Diagnoses: Knowledge deficit related to ulceration/compromised skin integrity Goals: Patient/caregiver will verbalize understanding of skin care regimen Date Initiated: 10/16/2022 Target Resolution Date: 12/20/2022 Goal Status: Active Interventions: Assess patient/caregiver ability to perform ulcer/skin care regimen  upon admission and as needed Assess ulceration(s) every visit Provide education on ulcer and skin care Treatment Activities: Skin care regimen initiated : 10/16/2022 Topical wound management initiated : 10/16/2022 Notes: Electronic Signature(s) Signed: 11/25/2022 2:58:52 PM By: Pearletha Alfred Entered By: Pearletha Alfred on 11/20/2022 14:09:04 -------------------------------------------------------------------------------- Negative Pressure Wound Therapy Maintenance (NPWT) Details Patient Name: Date of Service: [name], [name] 11/20/2022 12:45 PM Medical Record Number: [id number] Patient Account Number: [id number] Date of Birth/Sex: Treating RN: 08-08-1945 (77 y.o. F) Primary Care Azora Bonzo: Daphine Deutscher, Mary-Margaret Other Clinician: Referring Dessie Tatem: Treating Caster Fayette/Extender: Lauretta Grill, Mary-Margaret Weeks in Treatment: 5 NPWT Maintenance Performed for: Wound #1 Sacrum Performed By: , Type: VAC System Coverage Size (sq cm): 9.52 Pressure Type: Constant Muldrew, [name] ([id number]) [id number] Page 4 of 7 Pressure Setting: 125 mmHG Drain Type: None Primary Contact: Non-Adherent Sponge/Dressing Type: Foam- Black Date Initiated: 10/16/2022 Dressing Removed: No Quantity of Sponges/Gauze Inserted: 1 Respones T Treatment: o tolerated well Days On NPWT : 36 Post Procedure Diagnosis Same as Pre-procedure Electronic Signature(s) Signed: 11/25/2022 2:58:52 PM By: Pearletha Alfred Entered By: Pearletha Alfred on 11/20/2022 14:11:17 -------------------------------------------------------------------------------- Pain Assessment Details Patient Name: Date of Service: [name], [name] 11/20/2022 12:45 PM Medical Record Number: [id number] Patient Account Number: [id number] Date of Birth/Sex: Treating RN: 1945-09-23 (77 y.o. F) Primary Care Leighann Amadon: Daphine Deutscher, Mary-Margaret Other Clinician: Referring Denishia Citro: Treating Jamaul Heist/Extender: Lauretta Grill,  Mary-Margaret Weeks in Treatment: 5 Active Problems Location of Pain Severity and Description of Pain Patient Has Paino No Site Locations Pain Management and Medication Current Pain Management: Electronic Signature(s) Signed: 11/20/2022 4:13:56 PM By: Dayton Scrape Entered By: Dayton Scrape on 11/20/2022 13:11:15 Lecomte, [name] ([id number]) [id number] Page 5 of 7 -------------------------------------------------------------------------------- Patient/Caregiver Education Details Patient Name: Date of Service: [name], [name] 6/26/2024andnbsp12:45 PM Medical Record Number: [id number] Patient Account Number: [id number] Date of Birth/Gender: Treating RN: 01-22-1946 (77 y.o. F) Primary Care Physician: Daphine Deutscher, Mary-Margaret Other Clinician: Referring Physician: Treating Physician/Extender: Lauretta Grill, Mary-Margaret Weeks in Treatment: 5 Education Assessment Education Provided To: Patient Education Topics Provided Wound/Skin Impairment: Methods: Explain/Verbal Responses: State content correctly Electronic Signature(s) Signed: 11/25/2022 2:58:52 PM By: Pearletha Alfred Entered By: Pearletha Alfred on 11/20/2022 14:09:18 -------------------------------------------------------------------------------- Wound Assessment Details Patient Name: Date of Service: [name], [name] 11/20/2022 12:45 PM Medical Record Number: [id number] Patient Account Number: [id number] Date of Birth/Sex: Treating RN: 28-Jul-1945 (77 y.o. F) Primary Care Gerline Ratto: Daphine Deutscher, Mary-Margaret Other Clinician: Referring Ozil Stettler: Treating Jeanna Giuffre/Extender: Lauretta Grill, Mary-Margaret Weeks in Treatment: 5 Wound Status Wound Number: 1 Primary Pressure Ulcer Etiology: Wound Location: Sacrum Wound Open Wounding Event: Pressure Injury Status: Date Acquired: 08/26/2022 Comorbid Chronic Obstructive Pulmonary Disease (COPD), Congestive Weeks Of Treatment: 5 History: Heart Failure, Coronary  Artery Disease, Deep Vein Thrombosis, Clustered Wound: No Hypertension, Peripheral Venous Disease, Osteoarthritis, Dementia Wound Measurements Length: (  cm) 3.4 Width: (cm) 2.8 Depth: (cm) 1 Area: (cm) 7.477 Volume: (cm) 7.477 % Reduction in Area: 59.5% % Reduction in Volume: 76.2% Epithelialization: Small (1-33%) Tunneling: No Undermining: Yes Starting Position (o'clock): 1 Ending Position (o'clock): 5 Maximum Distance: (cm) 1.1 Wound Description Classification: Category/Stage IV Wound Margin: Thickened Exudate Amount: Medium Exudate Type: Serosanguineous Exudate Color: red, brown Foul Odor After Cleansing: No Slough/Fibrino Yes Wound Bed Granulation Amount: Small (1-33%) Exposed Structure Granulation Quality: Red, Pink Fascia Exposed: No Necrotic Amount: Large (67-100%) Fat Layer (Subcutaneous Tissue) Exposed: Yes Veronia Beets ([id number]) [id number] Page 6 of 7 Necrotic Quality: Adherent Slough Tendon Exposed: No Muscle Exposed: Yes Necrosis of Muscle: No Joint Exposed: No Bone Exposed: No Periwound Skin Texture Texture Color No Abnormalities Noted: No No Abnormalities Noted: No Callus: No Atrophie Blanche: No Crepitus: No Cyanosis: No Excoriation: No Ecchymosis: No Induration: No Erythema: No Rash: No Hemosiderin Staining: No Scarring: No Mottled: No Pallor: No Moisture Rubor: No No Abnormalities Noted: No Dry / Scaly: No Maceration: No Treatment Notes Wound #1 (Sacrum) Cleanser Wound Cleanser Discharge Instruction: Cleanse the wound with wound cleanser prior to applying a clean dressing using gauze sponges, not tissue or cotton balls. Peri-Wound Care Skin Prep Discharge Instruction: Use skin prep as directed Topical Primary Dressing wound vac Discharge Instruction: black foam bridge to left hip. Secondary Dressing Secured With Compression Wrap Compression Stockings Add-Ons Electronic  Signature(s) Signed: 11/22/2022 11:18:15 AM By: Thayer Dallas Entered By: Thayer Dallas on 11/20/2022 13:31:33 -------------------------------------------------------------------------------- Vitals Details Patient Name: Date of Service: [name], [name] 11/20/2022 12:45 PM Medical Record Number: [id number] Patient Account Number: [id number] Date of Birth/Sex: Treating RN: [birth year] (77 y.o. F) Primary Care Selso Mannor: Daphine Deutscher, Mary-Margaret Other Clinician: Referring Amadeus Oyama: Treating Serria Sloma/Extender: Lauretta Grill, Mary-Margaret Weeks in Treatment: 5 Vital Signs Time Taken: 01:10 Temperature (F): 98.6 Height (in): 62 Pulse (bpm): 71 Weight (lbs): 139 Respiratory Rate (breaths/min): 18 Body Mass Index (BMI): 25.4 Blood Pressure (mmHg): 126/73 Reference Range: 80 - 120 mg / dl [place], [name] ([id number]) [id number] Page 7 of 7 Electronic Signature(s) Signed: 11/20/2022 4:13:56 PM By: Dayton Scrape Entered By: Dayton Scrape on 11/20/2022 13:10:48

## 2022-11-25 NOTE — Progress Notes (Signed)
WYNEE, NUDD ([id number]) [id number] Page 1 of 8 Visit Report for 10/16/2022 Chief Complaint Document Details Patient Name: Date of Service: [name], [name] NA 10/16/2022 8:00 A M Medical Record Number: [id number] Patient Account Number: [id number] Date of Birth/Sex: Treating RN: July 14, 1945 (77 y.o. F) Primary Care Provider: Daphine Deutscher, Mary-Margaret Other Clinician: Referring Provider: Treating Provider/Extender: Lauretta Grill, Mary-Margaret Weeks in Treatment: 0 Information Obtained from: Patient Chief Complaint Sacral pressure ulcer Electronic Signature(s) Signed: 10/16/2022 8:22:40 AM By: Allen Derry PA-C Entered By: Allen Derry on 10/16/2022 08:22:40 -------------------------------------------------------------------------------- HPI Details Patient Name: Date of Service: [name], [name] NA 10/16/2022 8:00 A M Medical Record Number: [id number] Patient Account Number: [id number] Date of Birth/Sex: Treating RN: [birth year] (77 y.o. F) Primary Care Provider: Daphine Deutscher, Mary-Margaret Other Clinician: Referring Provider: Treating Provider/Extender: Lauretta Grill, Mary-Margaret Weeks in Treatment: 0 History of Present Illness HPI Description: 10-16-2022 upon evaluation today patient appears to be doing somewhat poorly in regard to a wound in the sacral area/lower back region. Fortunately there does not appear to be any signs of infection at this point which is great news. With that being said she unfortunately did develop this wound as a result of having had a hospital stay which dates back to looks like at least the beginning of April where there was unstageable wound noted. It was between May 2 and 7 that she fell and broke her shoulder and was put in a sling that she had C. difficile infection and subsequently this is when the wound was mostly described. Fortunately there does not appear to be any signs of infection locally or systemically which is great news  but at the same time she does have a significantly deep wound that is of concern at this point. That is the reason she comes in for evaluation to see if there is anything we can offer or recommend to get this healed faster. Patient does have a history of COPD, hypertension, chronic kidney disease stage III, atrial fibrillation, and vascular dementia although there is no behavioral disturbance that I see or perceived that she seems to be answering questions appropriately and asking good questions to be honest. Electronic Signature(s) Signed: 10/16/2022 9:53:37 AM By: Allen Derry PA-C Entered By: Allen Derry on 10/16/2022 09:53:37 -------------------------------------------------------------------------------- Physical Exam Details Patient Name: Date of Service: [name], [name] NA 10/16/2022 8:00 A [name], [name] ([id number]) [id number] Page 2 of 8 Medical Record Number: [id number] Patient Account Number: [id number] Date of Birth/Sex: Treating RN: 03-Feb-1946 (77 y.o. F) Primary Care Provider: Daphine Deutscher, Mary-Margaret Other Clinician: Referring Provider: Treating Provider/Extender: Lauretta Grill, Mary-Margaret Weeks in Treatment: 0 Constitutional sitting or standing blood pressure is within target range for patient.. pulse regular and within target range for patient.Marland Kitchen respirations regular, non-labored and within target range for patient.Marland Kitchen temperature within target range for patient.. Well-nourished and well-hydrated in no acute distress. Eyes conjunctiva clear no eyelid edema noted. pupils equal round and reactive to light and accommodation. Ears, Nose, Mouth, and Throat no gross abnormality of ear auricles or external auditory canals. normal hearing noted during conversation. mucus membranes moist. Respiratory normal breathing without difficulty. Musculoskeletal Patient unable to walk without assistance. no significant deformity or arthritic changes, no loss or  range of motion, no clubbing. Psychiatric this patient is able to make decisions and demonstrates good insight into disease process. Alert and Oriented x 3. pleasant and cooperative. Notes Upon inspection patient's wound bed actually showed signs of a fairly good appearing wound surface with no signs of infection and  overall I am actually very pleased with where we stand currently. I do believe that she would benefit from initiation of therapy with a wound VAC and I would go ahead and see about getting that ordered in the meantime I think Dakin's moistened gauze packing this can be the way to go until the wound VAC is established. Electronic Signature(s) Signed: 10/16/2022 9:56:42 AM By: Allen Derry PA-C Entered By: Allen Derry on 10/16/2022 09:56:41 -------------------------------------------------------------------------------- Physician Orders Details Patient Name: Date of Service: [name], [name] NA 10/16/2022 8:00 A M Medical Record Number: [id number] Patient Account Number: [id number] Date of Birth/Sex: Treating RN: 10-02-1945 (77 y.o. Arta Silence Primary Care Provider: Daphine Deutscher, Mary-Margaret Other Clinician: Referring Provider: Treating Provider/Extender: Lauretta Grill, Mary-Margaret Weeks in Treatment: 0 Verbal / Phone Orders: No Diagnosis Coding ICD-10 Coding Code Description L89.154 Pressure ulcer of sacral region, stage 4 J44.9 Chronic obstructive pulmonary disease, unspecified I10 Essential (primary) hypertension N18.30 Chronic kidney disease, stage 3 unspecified I48.0 Paroxysmal atrial fibrillation F01.A0 Vascular dementia, mild, without behavioral disturbance, psychotic disturbance, mood disturbance, and anxiety Follow-up Appointments ppointment in 2 weeks. Allen Derry, [place] Wednesday 0845 10/30/2022 room 8 Return A Anesthetic (In clinic) Topical Lidocaine 4% applied to wound bed Negative Presssure Wound Therapy Wound #1 Sacrum Wound Vac to wound continuously at  166mm/hg pressure - Facility to order and initiate wound vac. Change 3x a week. Black Foam - bridge black foam to hip. BEKKA, POPIEL ([id number]) [id number] Page 3 of 8 Off-Loading Low air-loss mattress (Group 2) - facility to ensure patient is using a group 2 air mattress. Turn and reposition every 2 hours Other: - continue using wheelchair cushion when in wheelchair. Additional Orders / Instructions Follow Nutritious Diet - increase protein to aid in wound healing. Juven Shake 1-2 times daily. Wound Treatment Wound #1 - Sacrum Cleanser: Wound Cleanser 1 x Per Day/30 Days Discharge Instructions: Cleanse the wound with wound cleanser prior to applying a clean dressing using gauze sponges, not tissue or cotton balls. Peri-Wound Care: Skin Prep 1 x Per Day/30 Days Discharge Instructions: Use skin prep as directed Prim Dressing: Dakin's Solution 0.25%, 16 (oz) 1 x Per Day/30 Days ary Discharge Instructions: Moisten gauze with Dakin's solution until wound vac arrives. Secondary Dressing: Zetuvit Plus Silicone Border Sacrum Dressing, Sm, 7x7 (in/in) 1 x Per Day/30 Days Discharge Instructions: Apply silicone border over primary dressing. Continue to use until wound vac arrives. Patient Medications llergies: Celebrex, Keflet, penicillin, Sulfa (Sulfonamide Antibiotics), Kenalog, latex, lisinopril, Mobic A Notifications Medication Indication Start End applied only in clinic for5/22/2024 lidocaine any debridement. DOSE topical 5 % gel - gel topical once daily Electronic Signature(s) Signed: 10/16/2022 3:53:56 PM By: Allen Derry PA-C Signed: 10/16/2022 6:02:30 PM By: Shawn Stall RN, BSN Entered By: Shawn Stall on 10/16/2022 08:31:27 -------------------------------------------------------------------------------- Problem List Details Patient Name: Date of Service: [name], [name] NA 10/16/2022 8:00 A M Medical Record Number: [id number] Patient Account Number:  [id number] Date of Birth/Sex: Treating RN: 1946/02/04 (77 y.o. F) Primary Care Provider: Daphine Deutscher, Mary-Margaret Other Clinician: Referring Provider: Treating Provider/Extender: Lauretta Grill, Mary-Margaret Weeks in Treatment: 0 Active Problems ICD-10 Encounter Code Description Active Date MDM Diagnosis L89.154 Pressure ulcer of sacral region, stage 4 10/16/2022 No Yes J44.9 Chronic obstructive pulmonary disease, unspecified 10/16/2022 No Yes I10 Essential (primary) hypertension 10/16/2022 No Yes N18.30 Chronic kidney disease, stage 3 unspecified 10/16/2022 No Yes CLIVER, [name] ([id number]) [id number] Page 4 of 8 I48.0 Paroxysmal atrial fibrillation 10/16/2022 No Yes F01.A0 Vascular dementia, mild, without behavioral  disturbance, psychotic 10/16/2022 No Yes disturbance, mood disturbance, and anxiety Inactive Problems Resolved Problems Electronic Signature(s) Signed: 10/16/2022 8:22:30 AM By: Allen Derry PA-C Entered By: Allen Derry on 10/16/2022 08:22:29 -------------------------------------------------------------------------------- Progress Note Details Patient Name: Date of Service: [name], [name] NA 10/16/2022 8:00 A M Medical Record Number: [id number] Patient Account Number: [id number] Date of Birth/Sex: Treating RN: [birth year] (77 y.o. F) Primary Care Provider: Daphine Deutscher, Mary-Margaret Other Clinician: Referring Provider: Treating Provider/Extender: Lauretta Grill, Mary-Margaret Weeks in Treatment: 0 Subjective Chief Complaint Information obtained from Patient Sacral pressure ulcer History of Present Illness (HPI) 10-16-2022 upon evaluation today patient appears to be doing somewhat poorly in regard to a wound in the sacral area/lower back region. Fortunately there does not appear to be any signs of infection at this point which is great news. With that being said she unfortunately did develop this wound as a result of having had a hospital  stay which dates back to looks like at least the beginning of April where there was unstageable wound noted. It was between May 2 and 7 that she fell and broke her shoulder and was put in a sling that she had C. difficile infection and subsequently this is when the wound was mostly described. Fortunately there does not appear to be any signs of infection locally or systemically which is great news but at the same time she does have a significantly deep wound that is of concern at this point. That is the reason she comes in for evaluation to see if there is anything we can offer or recommend to get this healed faster. Patient does have a history of COPD, hypertension, chronic kidney disease stage III, atrial fibrillation, and vascular dementia although there is no behavioral disturbance that I see or perceived that she seems to be answering questions appropriately and asking good questions to be honest. Patient History Allergies Celebrex, Keflet, penicillin (Reaction: hives), Sulfa (Sulfonamide Antibiotics), Kenalog, latex, lisinopril, Mobic Family History Unknown History. Social History Never smoker, Marital Status - Single, Alcohol Use - Never, Drug Use - No History, Caffeine Use - Never. Medical History Respiratory Patient has history of Chronic Obstructive Pulmonary Disease (COPD) Cardiovascular Patient has history of Congestive Heart Failure, Coronary Artery Disease - A,fib, Hypertension, Peripheral Venous Disease Musculoskeletal Patient has history of Osteoarthritis Neurologic Patient has history of Dementia Hospitalization/Surgery History - thoracentesis 2023. - laparotomy 2023. - hip fx 1990. - cholecystectomy 2015. - hysterectomy. - spinal fusion. - right shoulder fx 10/07/2022. Medical A Surgical History Notes nd [name], [name] ([id number]) [id number] Page 5 of 8 Constitutional Symptoms (General Health) thyroid disease vitamin D  deficiency Cardiovascular PVCs Gastrointestinal GERD SBO Musculoskeletal osteoporosis lumbar spondylosis Objective Constitutional sitting or standing blood pressure is within target range for patient.. pulse regular and within target range for patient.Marland Kitchen respirations regular, non-labored and within target range for patient.Marland Kitchen temperature within target range for patient.. Well-nourished and well-hydrated in no acute distress. Vitals Time Taken: 8:00 AM, Height: 62 in, Source: Stated, Weight: 139 lbs, Source: Stated, BMI: 25.4, Temperature: 98.2 F, Pulse: 61 bpm, Respiratory Rate: 16 breaths/min, Blood Pressure: 123/75 mmHg. Eyes conjunctiva clear no eyelid edema noted. pupils equal round and reactive to light and accommodation. Ears, Nose, Mouth, and Throat no gross abnormality of ear auricles or external auditory canals. normal hearing noted during conversation. mucus membranes moist. Respiratory normal breathing without difficulty. Musculoskeletal Patient unable to walk without assistance. no significant deformity or arthritic changes, no loss or range of motion, no clubbing. Psychiatric this patient  is able to make decisions and demonstrates good insight into disease process. Alert and Oriented x 3. pleasant and cooperative. General Notes: Upon inspection patient's wound bed actually showed signs of a fairly good appearing wound surface with no signs of infection and overall I am actually very pleased with where we stand currently. I do believe that she would benefit from initiation of therapy with a wound VAC and I would go ahead and see about getting that ordered in the meantime I think Dakin's moistened gauze packing this can be the way to go until the wound VAC is established. Integumentary (Hair, Skin) Wound #1 status is Open. Original cause of wound was Pressure Injury. The date acquired was: 08/26/2022. The wound is located on the Sacrum. The wound measures 5cm length x 4.7cm width x  1.7cm depth; 18.457cm^2 area and 31.377cm^3 volume. There is muscle and Fat Layer (Subcutaneous Tissue) exposed. There is no tunneling noted, however, there is undermining starting at 4:00 and ending at 7:00 with a maximum distance of 1cm. There is a medium amount of serosanguineous drainage noted. The wound margin is distinct with the outline attached to the wound base. There is medium (34-66%) red, pink granulation within the wound bed. There is a medium (34-66%) amount of necrotic tissue within the wound bed including Adherent Slough. The periwound skin appearance did not exhibit: Callus, Crepitus, Excoriation, Induration, Rash, Scarring, Dry/Scaly, Maceration, Atrophie Blanche, Cyanosis, Ecchymosis, Hemosiderin Staining, Mottled, Pallor, Rubor, Erythema. Assessment Active Problems ICD-10 Pressure ulcer of sacral region, stage 4 Chronic obstructive pulmonary disease, unspecified Essential (primary) hypertension Chronic kidney disease, stage 3 unspecified Paroxysmal atrial fibrillation Vascular dementia, mild, without behavioral disturbance, psychotic disturbance, mood disturbance, and anxiety Plan Follow-up Appointments: Return Appointment in 2 weeks. Allen Derry, [place] Wednesday 0845 10/30/2022 room 8 Anesthetic: (In clinic) Topical Lidocaine 4% applied to wound bed Negative Presssure Wound Therapy: Wound #1 Sacrum: Wound Vac to wound continuously at 121mm/hg pressure - Facility to order and initiate wound vac. Change 3x a week. Black Foam - bridge black foam to hip. Off-LoadingPRITIKA, [name] ([id number]) [id number] Page 6 of 8 Low air-loss mattress (Group 2) - facility to ensure patient is using a group 2 air mattress. Turn and reposition every 2 hours Other: - continue using wheelchair cushion when in wheelchair. Additional Orders / Instructions: Follow Nutritious Diet - increase protein to aid in wound healing. Juven Shake 1-2 times daily. The following  medication(s) was prescribed: lidocaine topical 5 % gel gel topical once daily for applied only in clinic for any debridement. was prescribed at facility WOUND #1: - Sacrum Wound Laterality: Cleanser: Wound Cleanser 1 x Per Day/30 Days Discharge Instructions: Cleanse the wound with wound cleanser prior to applying a clean dressing using gauze sponges, not tissue or cotton balls. Peri-Wound Care: Skin Prep 1 x Per Day/30 Days Discharge Instructions: Use skin prep as directed Prim Dressing: Dakin's Solution 0.25%, 16 (oz) 1 x Per Day/30 Days ary Discharge Instructions: Moisten gauze with Dakin's solution until wound vac arrives. Secondary Dressing: Zetuvit Plus Silicone Border Sacrum Dressing, Sm, 7x7 (in/in) 1 x Per Day/30 Days Discharge Instructions: Apply silicone border over primary dressing. Continue to use until wound vac arrives. 1. Based on what I am seeing I do believe that we should go ahead and initiate treatment with a wound VAC which I think is can be the best way to go. In the meantime until we get the wound VAC approved with him using Dakin's moistened gauze dressing. 2. I  am good recommend as well that the patient should continue with the appropriate offloading. She does have a group 2 low-air-loss mattress which is good she should be turning repositioning on a regular basis and does need to continue to have a wheelchair cushion as well. 3. I am also can recommend that we should continue to monitor for any signs of infection though I see nothing right now we will keep a close eye on things make sure nothing worsens. We will see patient back for reevaluation in 1 week here in the clinic. If anything worsens or changes patient will contact our office for additional recommendations. Electronic Signature(s) Signed: 11/25/2022 2:45:27 PM By: Pearletha Alfred Signed: 11/25/2022 3:12:52 PM By: Allen Derry PA-C Previous Signature: 10/16/2022 1:08:43 PM Version By: Allen Derry PA-C Entered By:  Pearletha Alfred on 11/25/2022 14:45:27 -------------------------------------------------------------------------------- HxROS Details Patient Name: Date of Service: [name], [name] NA 10/16/2022 8:00 A M Medical Record Number: [id number] Patient Account Number: [id number] Date of Birth/Sex: Treating RN: Feb 26, 1946 (77 y.o. Arta Silence Primary Care Provider: Daphine Deutscher, Mary-Margaret Other Clinician: Referring Provider: Treating Provider/Extender: Lauretta Grill, Mary-Margaret Weeks in Treatment: 0 Constitutional Symptoms (General Health) Medical History: Past Medical History Notes: thyroid disease vitamin D deficiency Respiratory Medical History: Positive for: Chronic Obstructive Pulmonary Disease (COPD) Cardiovascular Medical History: Positive for: Congestive Heart Failure; Coronary Artery Disease - A,fib; Hypertension; Peripheral Venous Disease Past Medical History Notes: PVCs Gastrointestinal Medical History: Past Medical History Notes: GERD SBO [name], [name] ([id number]) [id number] Page 7 of 8 Musculoskeletal Medical History: Positive for: Osteoarthritis Past Medical History Notes: osteoporosis lumbar spondylosis Neurologic Medical History: Positive for: Dementia Immunizations Pneumococcal Vaccine: Received Pneumococcal Vaccination: Yes Received Pneumococcal Vaccination On or After 60th Birthday: Yes Implantable Devices No devices added Hospitalization / Surgery History Type of Hospitalization/Surgery thoracentesis 2023 laparotomy 2023 hip fx 1990 cholecystectomy 2015 hysterectomy spinal fusion right shoulder fx 10/07/2022 Family and Social History Unknown History: Yes; Never smoker; Marital Status - Single; Alcohol Use: Never; Drug Use: No History; Caffeine Use: Never; Financial Concerns: No; Food, Clothing or Shelter Needs: No; Support System Lacking: No; Transportation Concerns: No Electronic Signature(s) Signed: 10/16/2022  3:53:56 PM By: Allen Derry PA-C Signed: 10/16/2022 6:02:30 PM By: Shawn Stall RN, BSN Entered By: Shawn Stall on 10/15/2022 13:41:41 -------------------------------------------------------------------------------- SuperBill Details Patient Name: Date of Service: [name], [name] NA 10/16/2022 Medical Record Number: [id number] Patient Account Number: [id number] Date of Birth/Sex: Treating RN: 14-Feb-1946 (77 y.o. Arta Silence Primary Care Provider: Daphine Deutscher, Mary-Margaret Other Clinician: Referring Provider: Treating Provider/Extender: Lauretta Grill, Mary-Margaret Weeks in Treatment: 0 Diagnosis Coding ICD-10 Codes Code Description L89.154 Pressure ulcer of sacral region, stage 4 J44.9 Chronic obstructive pulmonary disease, unspecified I10 Essential (primary) hypertension N18.30 Chronic kidney disease, stage 3 unspecified I48.0 Paroxysmal atrial fibrillation F01.A0 Vascular dementia, mild, without behavioral disturbance, psychotic disturbance, mood disturbance, and anxiety Facility Procedures : CASSEY, CERVANTES Code: [id number] [facility] ([id number]) Description: [id number] - WOUND CARE VISIT-LEV 5 EST PT [phone] Modifier: [id number] Quantity: 1 227.pdf Page 8 of 8 Physician Procedures : CPT4 Code Description Modifier [id number] - WC PHYS LEVEL 4 - NEW PT ICD-10 Diagnosis Description L89.154 Pressure ulcer of sacral region, stage 4 J44.9 Chronic obstructive pulmonary disease, unspecified I10 Essential (primary) hypertension N18.30  Chronic kidney disease, stage 3 unspecified Quantity: 1 Electronic Signature(s) Signed: 10/16/2022 1:08:54 PM By: Allen Derry PA-C Entered By: Allen Derry on 10/16/2022 13:08:54

## 2022-11-27 ENCOUNTER — Encounter (HOSPITAL_BASED_OUTPATIENT_CLINIC_OR_DEPARTMENT_OTHER): Payer: Medicare Other | Attending: Physician Assistant | Admitting: Physician Assistant

## 2022-11-27 DIAGNOSIS — I480 Paroxysmal atrial fibrillation: Secondary | ICD-10-CM | POA: Insufficient documentation

## 2022-11-27 DIAGNOSIS — F0150 Vascular dementia without behavioral disturbance: Secondary | ICD-10-CM | POA: Diagnosis not present

## 2022-11-27 DIAGNOSIS — J449 Chronic obstructive pulmonary disease, unspecified: Secondary | ICD-10-CM | POA: Diagnosis not present

## 2022-11-27 DIAGNOSIS — I130 Hypertensive heart and chronic kidney disease with heart failure and stage 1 through stage 4 chronic kidney disease, or unspecified chronic kidney disease: Secondary | ICD-10-CM | POA: Diagnosis not present

## 2022-11-27 DIAGNOSIS — L89154 Pressure ulcer of sacral region, stage 4: Secondary | ICD-10-CM | POA: Insufficient documentation

## 2022-11-27 NOTE — Progress Notes (Signed)
PALLAVI, KUZIO ([id number]) [id number] Page 1 of 6 Visit Report for 11/27/2022 Arrival Information Details Patient Name: Date of Service: KERREN, GLIDEWELL NA 11/27/2022 10:30 A M Medical Record Number: [id number] Patient Account Number: [id number] Date of Birth/Sex: Treating RN: 1945-12-15 (76 y.o. F) Primary Care Jonavan Vanhorn: Daphine Deutscher, Mary-Margaret Other Clinician: Referring Kambri Dismore: Treating Kaveh Kissinger/Extender: Lauretta Grill, Mary-Margaret Weeks in Treatment: 6 Visit Information History Since Last Visit Added or deleted any medications: No Patient Arrived: Wheel Chair Any new allergies or adverse reactions: No Arrival Time: 10:50 Had a fall or experienced change in No Accompanied By: caregiver activities of daily living that may affect Transfer Assistance: None risk of falls: Patient Identification Verified: Yes Signs or symptoms of abuse/neglect since last visito No Secondary Verification Process Completed: Yes Hospitalized since last visit: No Patient Requires Transmission-Based Precautions: No Implantable device outside of the clinic excluding No Patient Has Alerts: No cellular tissue based products placed in the center since last visit: Has Dressing in Place as Prescribed: Yes Pain Present Now: Yes Electronic Signature(s) Signed: 11/27/2022 4:52:44 PM By: Thayer Dallas Entered By: Thayer Dallas on 11/27/2022 10:57:24 -------------------------------------------------------------------------------- Encounter Discharge Information Details Patient Name: Date of Service: Leonie Man NA 11/27/2022 10:30 A M Medical Record Number: [id number] Patient Account Number: [id number] Date of Birth/Sex: Treating RN: 02-24-1946 (77 y.o. Arta Silence Primary Care Enzley Kitchens: Daphine Deutscher, Mary-Margaret Other Clinician: Referring Margarett Viti: Treating Prairie Stenberg/Extender: Lauretta Grill, Mary-Margaret Weeks in Treatment: 6 Encounter Discharge Information Items Post  Procedure Vitals Discharge Condition: Stable Temperature (F): 97.9 Ambulatory Status: Wheelchair Pulse (bpm): 64 Discharge Destination: Home Respiratory Rate (breaths/min): 18 Transportation: Private Auto Blood Pressure (mmHg): 127/79 Accompanied By: caregiver Schedule Follow-up Appointment: Yes Clinical Summary of Care: Electronic Signature(s) Signed: 11/27/2022 4:48:32 PM By: Shawn Stall RN, BSN Entered By: Shawn Stall on 11/27/2022 11:37:59 Gibeault, [name] ([id number]) [id number] Page 2 of 6 -------------------------------------------------------------------------------- Lower Extremity Assessment Details Patient Name: Date of Service: MARQITA, BECERRIL NA 11/27/2022 10:30 A M Medical Record Number: [id number] Patient Account Number: [id number] Date of Birth/Sex: Treating RN: May 23, 1946 (77 y.o. F) Primary Care Jamel Holzmann: Daphine Deutscher, Mary-Margaret Other Clinician: Referring Nyiesha Beever: Treating Vihaan Gloss/Extender: Lauretta Grill, Mary-Margaret Weeks in Treatment: 6 Electronic Signature(s) Signed: 11/27/2022 4:52:44 PM By: Thayer Dallas Entered By: Thayer Dallas on 11/27/2022 10:58:15 -------------------------------------------------------------------------------- Multi-Disciplinary Care Plan Details Patient Name: Date of Service: JAILIA, ENOS NA 11/27/2022 10:30 A M Medical Record Number: [id number] Patient Account Number: [id number] Date of Birth/Sex: Treating RN: 1946/04/27 (77 y.o. [name], [name] Primary Care Nochum Fenter: Daphine Deutscher, Mary-Margaret Other Clinician: Referring Nickoli Bagheri: Treating Jeri Rawlins/Extender: Lauretta Grill, Mary-Margaret Weeks in Treatment: 6 Multidisciplinary Care Plan reviewed with physician Active Inactive Necrotic Tissue Nursing Diagnoses: Knowledge deficit related to management of necrotic/devitalized tissue Goals: Necrotic/devitalized tissue will be minimized in the wound bed Date Initiated: 10/16/2022 Target Resolution  Date: 12/20/2022 Goal Status: Active Patient/caregiver will verbalize understanding of reason and process for debridement of necrotic tissue Date Initiated: 10/16/2022 Target Resolution Date: 12/20/2022 Goal Status: Active Interventions: Assess patient pain level pre-, during and post procedure and prior to discharge Provide education on necrotic tissue and debridement process Treatment Activities: Apply topical anesthetic as ordered : 10/16/2022 Enzymatic debridement : 10/16/2022 Notes: Pain, Acute or Chronic Nursing Diagnoses: Pain, acute or chronic: actual or potential Potential alteration in comfort, pain Goals: Patient will verbalize adequate pain control and receive pain control interventions during procedures as needed Date Initiated: 10/16/2022 Target Resolution Date: 12/20/2022 Goal Status: Active Patient/caregiver will verbalize comfort level met DAMIANA, WEHRMANN ([id number]) [id number] Page  3 of 6 Date Initiated: 10/16/2022 Target Resolution Date: 12/27/2022 Goal Status: Active Interventions: Assess comfort goal upon admission Complete pain assessment as per visit requirements Provide education on pain management Treatment Activities: Administer pain control measures as ordered : 10/16/2022 Notes: Pressure Nursing Diagnoses: Knowledge deficit related to management of pressures ulcers Goals: Patient will remain free of pressure ulcers Date Initiated: 10/16/2022 Target Resolution Date: 12/20/2022 Goal Status: Active Interventions: Assess: immobility, friction, shearing, incontinence upon admission and as needed Assess offloading mechanisms upon admission and as needed Assess potential for pressure ulcer upon admission and as needed Provide education on pressure ulcers Notes: Wound/Skin Impairment Nursing Diagnoses: Knowledge deficit related to ulceration/compromised skin integrity Goals: Patient/caregiver will verbalize understanding of skin care  regimen Date Initiated: 10/16/2022 Target Resolution Date: 12/20/2022 Goal Status: Active Interventions: Assess patient/caregiver ability to perform ulcer/skin care regimen upon admission and as needed Assess ulceration(s) every visit Provide education on ulcer and skin care Treatment Activities: Skin care regimen initiated : 10/16/2022 Topical wound management initiated : 10/16/2022 Notes: Electronic Signature(s) Signed: 11/27/2022 4:48:32 PM By: Shawn Stall RN, BSN Entered By: Shawn Stall on 11/27/2022 11:33:51 -------------------------------------------------------------------------------- Pain Assessment Details Patient Name: Date of Service: MAYLING, BARRIO NA 11/27/2022 10:30 A M Medical Record Number: [id number] Patient Account Number: [id number] Date of Birth/Sex: Treating RN: 09/10/1945 (77 y.o. F) Primary Care Quante Pettry: Daphine Deutscher, Mary-Margaret Other Clinician: Referring Silvano Garofano: Treating Auryn Paige/Extender: Lauretta Grill, Mary-Margaret Weeks in Treatment: 6 Active Problems Location of Pain Severity and Description of Pain Patient Has Melisaa Vandenakker, [name] ([id number]) [id number] Page 4 of 6 Patient Has Paino Yes Site Locations Pain Location: Pain in Ulcers Rate the pain. Current Pain Level: 6 Pain Management and Medication Current Pain Management: Electronic Signature(s) Signed: 11/27/2022 4:52:44 PM By: Thayer Dallas Entered By: Thayer Dallas on 11/27/2022 10:58:09 -------------------------------------------------------------------------------- Patient/Caregiver Education Details Patient Name: Date of Service: Leonie Man NA 7/3/2024andnbsp10:30 A M Medical Record Number: [id number] Patient Account Number: [id number] Date of Birth/Gender: Treating RN: Nov 13, 1945 (77 y.o. Arta Silence Primary Care Physician: Daphine Deutscher, Mary-Margaret Other Clinician: Referring Physician: Treating Physician/Extender: Lauretta Grill,  Mary-Margaret Weeks in Treatment: 6 Education Assessment Education Provided To: Patient Education Topics Provided Wound/Skin Impairment: Handouts: Caring for Your Ulcer Methods: Explain/Verbal Responses: Reinforcements needed Electronic Signature(s) Signed: 11/27/2022 4:48:32 PM By: Shawn Stall RN, BSN Entered By: Shawn Stall on 11/27/2022 11:34:04 -------------------------------------------------------------------------------- Wound Assessment Details Patient Name: Date of Service: Leonie Man NA 11/27/2022 10:30 A M Medical Record Number: [id number] Patient Account Number: [id number] JASMEEN, GEISS ([id number]) [id number] Page 5 of 6 Date of Birth/Sex: Treating RN: 1945-07-26 (77 y.o. F) Primary Care Breda Bond: Other Clinician: Daphine Deutscher, Mary-Margaret Referring Katara Griner: Treating Laquandra Carrillo/Extender: Lauretta Grill, Mary-Margaret Weeks in Treatment: 6 Wound Status Wound Number: 1 Primary Pressure Ulcer Etiology: Wound Location: Sacrum Wound Open Wounding Event: Pressure Injury Status: Date Acquired: 08/26/2022 Comorbid Chronic Obstructive Pulmonary Disease (COPD), Congestive Weeks Of Treatment: 6 History: Heart Failure, Coronary Artery Disease, Deep Vein Thrombosis, Clustered Wound: No Hypertension, Peripheral Venous Disease, Osteoarthritis, Dementia Photos Wound Measurements Length: (cm) 2.5 Width: (cm) 1 Depth: (cm) 1.3 Area: (cm) 1.963 Volume: (cm) 2.553 % Reduction in Area: 89.4% % Reduction in Volume: 91.9% Epithelialization: Small (1-33%) Tunneling: No Undermining: No Wound Description Classification: Category/Stage IV Wound Margin: Thickened Exudate Amount: Medium Exudate Type: Serosanguineous Exudate Color: red, brown Foul Odor After Cleansing: No Slough/Fibrino Yes Wound Bed Granulation Amount: Medium (34-66%) Exposed Structure Granulation Quality: Red, Pink Fascia Exposed: No Necrotic Amount: Medium (34-66%) Fat Layer  (Subcutaneous  Tissue) Exposed: Yes Necrotic Quality: Adherent Slough Tendon Exposed: No Muscle Exposed: Yes Necrosis of Muscle: No Joint Exposed: No Bone Exposed: No Periwound Skin Texture Texture Color No Abnormalities Noted: No No Abnormalities Noted: No Callus: No Atrophie Blanche: No Crepitus: No Cyanosis: No Excoriation: No Ecchymosis: No Induration: No Erythema: No Rash: No Hemosiderin Staining: No Scarring: No Mottled: No Pallor: No Moisture Rubor: No No Abnormalities Noted: No Dry / Scaly: No Maceration: No Treatment Notes Wound #1 (Sacrum) Cleanser Wound Cleanser Discharge Instruction: Cleanse the wound with wound cleanser prior to applying a clean dressing using gauze sponges, not tissue or cotton balls. RUPINDER, VICKNAIR ([id number]) [id number] Page 6 of 6 Peri-Wound Care Skin Prep Discharge Instruction: Use skin prep as directed Topical Primary Dressing Hydrofera Blue Ready Transfer Foam, 4x5 (in/in) Discharge Instruction: Apply to wound bed. ensure to layer the hydrofera blue to fill space. Secondary Dressing Zetuvit Plus Silicone Border Dressing 4x4 (in/in) Discharge Instruction: Apply silicone border over primary dressing as directed. Secured With Compression Wrap Compression Stockings Facilities manager) Signed: 11/27/2022 4:52:44 PM By: Thayer Dallas Entered By: Thayer Dallas on 11/27/2022 11:14:48 -------------------------------------------------------------------------------- Vitals Details Patient Name: Date of Service: GERMAINE, SITZES NA 11/27/2022 10:30 A M Medical Record Number: [id number] Patient Account Number: [id number] Date of Birth/Sex: Treating RN: [birth year] (77 y.o. F) Primary Care Dylin Ihnen: Daphine Deutscher, Mary-Margaret Other Clinician: Referring Nicoli Nardozzi: Treating Athena Baltz/Extender: Lauretta Grill, Mary-Margaret Weeks in Treatment: 6 Vital Signs Time Taken: 10:57 Temperature (F): 97.9 Height  (in): 62 Pulse (bpm): 64 Weight (lbs): 139 Respiratory Rate (breaths/min): 18 Body Mass Index (BMI): 25.4 Blood Pressure (mmHg): 127/79 Reference Range: 80 - 120 mg / dl Electronic Signature(s) Signed: 11/27/2022 4:52:44 PM By: Thayer Dallas Entered By: Thayer Dallas on 11/27/2022 10:57:50

## 2022-11-27 NOTE — Progress Notes (Addendum)
LILY, [name] ([id number]) [id number] Page 1 of 7 Visit Report for 11/27/2022 Chief Complaint Document Details Patient Name: Date of Service: [name], [name] NA 11/27/2022 10:30 A M Medical Record Number: [id number] Patient Account Number: [id number] Date of Birth/Sex: Treating RN: March 20, 1946 (77 y.o. F) Primary Care Provider: Daphine Deutscher, Mary-Margaret Other Clinician: Referring Provider: Treating Provider/Extender: Lauretta Grill, Mary-Margaret Weeks in Treatment: 6 Information Obtained from: Patient Chief Complaint Sacral pressure ulcer Electronic Signature(s) Signed: 11/27/2022 11:10:21 AM By: Allen Derry PA-C Entered By: Allen Derry on 11/27/2022 11:10:21 -------------------------------------------------------------------------------- Debridement Details Patient Name: Date of Service: [name], [name] NA 11/27/2022 10:30 A M Medical Record Number: [id number] Patient Account Number: [id number] Date of Birth/Sex: Treating RN: 11/03/1945 (77 y.o. Arta Silence Primary Care Provider: Daphine Deutscher, Mary-Margaret Other Clinician: Referring Provider: Treating Provider/Extender: Lauretta Grill, Mary-Margaret Weeks in Treatment: 6 Debridement Performed for Assessment: Wound #1 Sacrum Performed By: Physician Lenda Kelp, PA Debridement Type: Debridement Level of Consciousness (Pre-procedure): Awake and Alert Pre-procedure Verification/Time Out Yes - 11:30 Taken: Start Time: 11:31 Pain Control: Lidocaine 4% T opical Solution Percent of Wound Bed Debrided: 100% T Area Debrided (cm): otal 1.96 Tissue and other material debrided: Viable, Non-Viable, Slough, Subcutaneous, Skin: Dermis , Skin: Epidermis, Biofilm, Slough Level: Skin/Subcutaneous Tissue Debridement Description: Excisional Instrument: Curette Bleeding: Minimum Hemostasis Achieved: Pressure End Time: 11:35 Procedural Pain: 0 Post Procedural Pain: 0 Response to Treatment: Procedure was tolerated  well Level of Consciousness (Post- Awake and Alert procedure): Post Debridement Measurements of Total Wound Length: (cm) 2.5 Stage: Category/Stage IV Width: (cm) 1 Depth: (cm) 1.3 Volume: (cm) 2.553 Character of Wound/Ulcer Post Debridement: Improved Veronia Beets ([id number]) [id number] Page 2 of 7 Post Procedure Diagnosis Same as Pre-procedure Electronic Signature(s) Signed: 11/27/2022 3:56:42 PM By: Allen Derry PA-C Signed: 11/27/2022 4:48:32 PM By: Shawn Stall RN, BSN Entered By: Shawn Stall on 11/27/2022 11:34:48 -------------------------------------------------------------------------------- HPI Details Patient Name: Date of Service: [name], [name] NA 11/27/2022 10:30 A M Medical Record Number: [id number] Patient Account Number: [id number] Date of Birth/Sex: Treating RN: Apr 03, 1946 (77 y.o. F) Primary Care Provider: Daphine Deutscher, Mary-Margaret Other Clinician: Referring Provider: Treating Provider/Extender: Lauretta Grill, Mary-Margaret Weeks in Treatment: 6 History of Present Illness HPI Description: 10-16-2022 upon evaluation today patient appears to be doing somewhat poorly in regard to a wound in the sacral area/lower back region. Fortunately there does not appear to be any signs of infection at this point which is great news. With that being said she unfortunately did develop this wound as a result of having had a hospital stay which dates back to looks like at least the beginning of April where there was unstageable wound noted. It was between May 2 and 7 that she fell and broke her shoulder and was put in a sling that she had C. difficile infection and subsequently this is when the wound was mostly described. Fortunately there does not appear to be any signs of infection locally or systemically which is great news but at the same time she does have a significantly deep wound that is of concern at this point. That is the reason she comes in for  evaluation to see if there is anything we can offer or recommend to get this healed faster. Patient does have a history of COPD, hypertension, chronic kidney disease stage III, atrial fibrillation, and vascular dementia although there is no behavioral disturbance that I see or perceived that she seems to be answering questions appropriately and asking good questions to be honest. 10-30-2022 upon  evaluation today patient appears to be doing well currently in regard to her wound which appears to be healthy at this point. I do not see any signs of active infection locally nor systemically which is great news. No fevers, chills, nausea, vomiting, or diarrhea. With that being said I am actually not seeing much improvement compared to last time I saw her simply due to the fact that she has not had the wound VAC on she was seen on the 22nd and then subsequently ended up being admitted to the hospital on the 25th due to a blood clot in her leg. This ended with therapy in the hospital through what appears to be the third so she is going to back to the facility for 2 days before coming in today and then subsequently the wound VAC was not used upon readmission. We did have a letter from the treatment nurse that stated the patient transfers independently multiple times throughout the day. Obviously based on the weakness and what I am seeing it took to individuals my nurse and the individual with her from the facility to move her into the bed today safely. I do not think she needs to be transferring independently and I think she may need a sitter in fact we might even need to do this long-term while she is on the wound VAC in order to ensure that she does not try to get up on her own and in the process hurt herself. Fortunately I do not see any signs of active infection locally nor systemically which is great news. I do still think a wound VAC would be the primary best way to get this closed as quickly as  possible. 11-13-2022 upon evaluation patient appears to be doing better in regard to her wounds from a size perspective although are still having a lot of issues here with the facility putting on the wound VAC appropriately. They continue to not really do this appropriately which has been the primary concern. In fact because of that it is not really functioning properly and continues to not do as well as we really think it should be. Nonetheless we will going to see about having [employer] go out and do some training with regard to placement of wound vacs. 11-20-2022 upon evaluation today patient appears to be doing well currently in regard to her wound. She is actually showing signs of improvement I am actually very pleased with where things stand I do believe that wound VAC is doing a good job here. Fortunately I do not see any evidence of active infection locally or systemically which is great news. No fevers, chills, nausea, vomiting, or diarrhea. 11-27-2022 upon evaluation patient's wound is doing really about the same. I do not think it is any worse also do not think it significantly better at this point. I do think that she would benefit from a continuation of therapy with regard to the wound VAC but unfortunately this just does not seem to be staying in place which is unfortunate. Electronic Signature(s) Signed: 11/27/2022 2:49:12 PM By: Allen Derry PA-C Entered By: Allen Derry on 11/27/2022 14:49:12 -------------------------------------------------------------------------------- Physical Exam Details Patient Name: Date of Service: [name], [name] NA 11/27/2022 10:30 A M Medical Record Number: [id number] Patient Account Number: [id number] [name], [name] ([id number]) [id number] Page 3 of 7 Date of Birth/Sex: Treating RN: [birth year] (77 y.o. F) Primary Care Provider: Other Clinician: Daphine Deutscher, Mary-Margaret Referring Provider: Treating Provider/Extender: Lauretta Grill,  Mary-Margaret Weeks in Treatment: 6 Constitutional Well-nourished  and well-hydrated in no acute distress. Respiratory normal breathing without difficulty. Psychiatric this patient is able to make decisions and demonstrates good insight into disease process. Alert and Oriented x 3. pleasant and cooperative. Notes Based on what I am seeing I did go ahead and perform debridement clearway some of the necrotic debris she tolerated this without any pain or complication postdebridement this looks better. Electronic Signature(s) Signed: 11/27/2022 2:49:28 PM By: Allen Derry PA-C Entered By: Allen Derry on 11/27/2022 14:49:28 -------------------------------------------------------------------------------- Physician Orders Details Patient Name: Date of Service: [name], [name] NA 11/27/2022 10:30 A M Medical Record Number: [id number] Patient Account Number: [id number] Date of Birth/Sex: Treating RN: 17-May-1946 (77 y.o. Arta Silence Primary Care Provider: Daphine Deutscher, Mary-Margaret Other Clinician: Referring Provider: Treating Provider/Extender: Lauretta Grill, Mary-Margaret Weeks in Treatment: 6 Verbal / Phone Orders: No Diagnosis Coding ICD-10 Coding Code Description L89.154 Pressure ulcer of sacral region, stage 4 J44.9 Chronic obstructive pulmonary disease, unspecified I10 Essential (primary) hypertension N18.30 Chronic kidney disease, stage 3 unspecified I48.0 Paroxysmal atrial fibrillation F01.A0 Vascular dementia, mild, without behavioral disturbance, psychotic disturbance, mood disturbance, and anxiety Follow-up Appointments ppointment in 1 week. Leonard Schwartz Wednesday Return A Other: - Discontinue wound vac. Hydrofera blue with bordered foam as new dressing change Monday, Wednesday, and Friday. Anesthetic (In clinic) Topical Lidocaine 4% applied to wound bed Negative Presssure Wound Therapy Wound #1 Sacrum Discontinue wound vac. Call the number on the machine for the company to pick  up. Off-Loading Low air-loss mattress (Group 2) - facility to ensure patient is using a group 2 air mattress. Turn and reposition every 2 hours Other: - continue using wheelchair cushion when in wheelchair. patient to minimize being up in wheelchair. 2 hours in the chair then 1 hour in the bed. Additional Orders / Instructions Follow Nutritious Diet - increase protein to aid in wound healing. Juven Shake 1-2 times daily. Non Wound Condition Bilateral Lower Extremities NYIAH, CHOLEWA ([id number]) [id number] Page 4 of 7 Other Non Wound Condition Orders/Instructions: - wrap both legs with unna boots change 3x a week. Wound Treatment Wound #1 - Sacrum Cleanser: Wound Cleanser 3 x Per Week/30 Days Discharge Instructions: Cleanse the wound with wound cleanser prior to applying a clean dressing using gauze sponges, not tissue or cotton balls. Peri-Wound Care: Skin Prep 3 x Per Week/30 Days Discharge Instructions: Use skin prep as directed Prim Dressing: Hydrofera Blue Ready Transfer Foam, 4x5 (in/in) 3 x Per Week/30 Days ary Discharge Instructions: Apply to wound bed. ensure to layer the hydrofera blue to fill space. Secondary Dressing: Zetuvit Plus Silicone Border Dressing 4x4 (in/in) 3 x Per Week/30 Days Discharge Instructions: Apply silicone border over primary dressing as directed. Electronic Signature(s) Signed: 11/27/2022 3:56:42 PM By: Allen Derry PA-C Signed: 11/27/2022 4:48:32 PM By: Shawn Stall RN, BSN Entered By: Shawn Stall on 11/27/2022 11:36:58 -------------------------------------------------------------------------------- Problem List Details Patient Name: Date of Service: [name], [name] NA 11/27/2022 10:30 A M Medical Record Number: [id number] Patient Account Number: [id number] Date of Birth/Sex: Treating RN: 07-04-1945 (76 y.o. F) Primary Care Provider: Daphine Deutscher, Mary-Margaret Other Clinician: Referring Provider: Treating Provider/Extender: Lauretta Grill, Mary-Margaret Weeks in Treatment: 6 Active Problems ICD-10 Encounter Code Description Active Date MDM Diagnosis L89.154 Pressure ulcer of sacral region, stage 4 10/16/2022 No Yes J44.9 Chronic obstructive pulmonary disease, unspecified 10/16/2022 No Yes I10 Essential (primary) hypertension 10/16/2022 No Yes N18.30 Chronic kidney disease, stage 3 unspecified 10/16/2022 No Yes I48.0 Paroxysmal atrial fibrillation 10/16/2022 No Yes F01.A0 Vascular dementia, mild, without behavioral  disturbance, psychotic 10/16/2022 No Yes disturbance, mood disturbance, and anxiety Inactive Problems Resolved Problems [name], [name] ([id number]) [id number] Page 5 of 7 Electronic Signature(s) Signed: 11/27/2022 11:10:09 AM By: Allen Derry PA-C Entered By: Allen Derry on 11/27/2022 11:10:09 -------------------------------------------------------------------------------- Progress Note Details Patient Name: Date of Service: [name], [name] NA 11/27/2022 10:30 A M Medical Record Number: [id number] Patient Account Number: [id number] Date of Birth/Sex: Treating RN: 11-07-1945 (77 y.o. F) Primary Care Provider: Daphine Deutscher, Mary-Margaret Other Clinician: Referring Provider: Treating Provider/Extender: Lauretta Grill, Mary-Margaret Weeks in Treatment: 6 Subjective Chief Complaint Information obtained from Patient Sacral pressure ulcer History of Present Illness (HPI) 10-16-2022 upon evaluation today patient appears to be doing somewhat poorly in regard to a wound in the sacral area/lower back region. Fortunately there does not appear to be any signs of infection at this point which is great news. With that being said she unfortunately did develop this wound as a result of having had a hospital stay which dates back to looks like at least the beginning of April where there was unstageable wound noted. It was between May 2 and 7 that she fell and broke her shoulder and was put in a  sling that she had C. difficile infection and subsequently this is when the wound was mostly described. Fortunately there does not appear to be any signs of infection locally or systemically which is great news but at the same time she does have a significantly deep wound that is of concern at this point. That is the reason she comes in for evaluation to see if there is anything we can offer or recommend to get this healed faster. Patient does have a history of COPD, hypertension, chronic kidney disease stage III, atrial fibrillation, and vascular dementia although there is no behavioral disturbance that I see or perceived that she seems to be answering questions appropriately and asking good questions to be honest. 10-30-2022 upon evaluation today patient appears to be doing well currently in regard to her wound which appears to be healthy at this point. I do not see any signs of active infection locally nor systemically which is great news. No fevers, chills, nausea, vomiting, or diarrhea. With that being said I am actually not seeing much improvement compared to last time I saw her simply due to the fact that she has not had the wound VAC on she was seen on the 22nd and then subsequently ended up being admitted to the hospital on the 25th due to a blood clot in her leg. This ended with therapy in the hospital through what appears to be the third so she is going to back to the facility for 2 days before coming in today and then subsequently the wound VAC was not used upon readmission. We did have a letter from the treatment nurse that stated the patient transfers independently multiple times throughout the day. Obviously based on the weakness and what I am seeing it took to individuals my nurse and the individual with her from the facility to move her into the bed today safely. I do not think she needs to be transferring independently and I think she may need a sitter in fact we might even need to do  this long-term while she is on the wound VAC in order to ensure that she does not try to get up on her own and in the process hurt herself. Fortunately I do not see any signs of active infection locally nor systemically which is great news.  I do still think a wound VAC would be the primary best way to get this closed as quickly as possible. 11-13-2022 upon evaluation patient appears to be doing better in regard to her wounds from a size perspective although are still having a lot of issues here with the facility putting on the wound VAC appropriately. They continue to not really do this appropriately which has been the primary concern. In fact because of that it is not really functioning properly and continues to not do as well as we really think it should be. Nonetheless we will going to see about having [employer] go out and do some training with regard to placement of wound vacs. 11-20-2022 upon evaluation today patient appears to be doing well currently in regard to her wound. She is actually showing signs of improvement I am actually very pleased with where things stand I do believe that wound VAC is doing a good job here. Fortunately I do not see any evidence of active infection locally or systemically which is great news. No fevers, chills, nausea, vomiting, or diarrhea. 11-27-2022 upon evaluation patient's wound is doing really about the same. I do not think it is any worse also do not think it significantly better at this point. I do think that she would benefit from a continuation of therapy with regard to the wound VAC but unfortunately this just does not seem to be staying in place which is unfortunate. Objective Constitutional Well-nourished and well-hydrated in no acute distress. Vitals Time Taken: 10:57 AM, Height: 62 in, Weight: 139 lbs, BMI: 25.4, Temperature: 97.9 F, Pulse: 64 bpm, Respiratory Rate: 18 breaths/min, Blood Pressure: 127/79 mmHg. Respiratory normal breathing without  difficulty. Psychiatric [name], [name] ([id number]) [id number] Page 6 of 7 this patient is able to make decisions and demonstrates good insight into disease process. Alert and Oriented x 3. pleasant and cooperative. General Notes: Based on what I am seeing I did go ahead and perform debridement clearway some of the necrotic debris she tolerated this without any pain or complication postdebridement this looks better. Integumentary (Hair, Skin) Wound #1 status is Open. Original cause of wound was Pressure Injury. The date acquired was: 08/26/2022. The wound has been in treatment 6 weeks. The wound is located on the Sacrum. The wound measures 2.5cm length x 1cm width x 1.3cm depth; 1.963cm^2 area and 2.553cm^3 volume. There is muscle and Fat Layer (Subcutaneous Tissue) exposed. There is no tunneling or undermining noted. There is a medium amount of serosanguineous drainage noted. The wound margin is thickened. There is medium (34-66%) red, pink granulation within the wound bed. There is a medium (34-66%) amount of necrotic tissue within the wound bed including Adherent Slough. The periwound skin appearance did not exhibit: Callus, Crepitus, Excoriation, Induration, Rash, Scarring, Dry/Scaly, Maceration, Atrophie Blanche, Cyanosis, Ecchymosis, Hemosiderin Staining, Mottled, Pallor, Rubor, Erythema. Assessment Active Problems ICD-10 Pressure ulcer of sacral region, stage 4 Chronic obstructive pulmonary disease, unspecified Essential (primary) hypertension Chronic kidney disease, stage 3 unspecified Paroxysmal atrial fibrillation Vascular dementia, mild, without behavioral disturbance, psychotic disturbance, mood disturbance, and anxiety Procedures Wound #1 Pre-procedure diagnosis of Wound #1 is a Pressure Ulcer located on the Sacrum . There was a Excisional Skin/Subcutaneous Tissue Debridement with a total area of 1.96 sq cm performed by Lenda Kelp, PA. With the  following instrument(s): Curette to remove Viable and Non-Viable tissue/material. Material removed includes Subcutaneous Tissue, Slough, Skin: Dermis, Skin: Epidermis, and Biofilm after achieving pain control using Lidocaine 4% T opical Solution. A  time out was conducted at 11:30, prior to the start of the procedure. A Minimum amount of bleeding was controlled with Pressure. The procedure was tolerated well with a pain level of 0 throughout and a pain level of 0 following the procedure. Post Debridement Measurements: 2.5cm length x 1cm width x 1.3cm depth; 2.553cm^3 volume. Post debridement Stage noted as Category/Stage IV. Character of Wound/Ulcer Post Debridement is improved. Post procedure Diagnosis Wound #1: Same as Pre-Procedure Plan Follow-up Appointments: Return Appointment in 1 week. Leonard Schwartz Wednesday Other: - Discontinue wound vac. Hydrofera blue with bordered foam as new dressing change Monday, Wednesday, and Friday. Anesthetic: (In clinic) Topical Lidocaine 4% applied to wound bed Negative Presssure Wound Therapy: Wound #1 Sacrum: Discontinue wound vac. Call the number on the machine for the company to pick up. Off-Loading: Low air-loss mattress (Group 2) - facility to ensure patient is using a group 2 air mattress. Turn and reposition every 2 hours Other: - continue using wheelchair cushion when in wheelchair. patient to minimize being up in wheelchair. 2 hours in the chair then 1 hour in the bed. Additional Orders / Instructions: Follow Nutritious Diet - increase protein to aid in wound healing. Juven Shake 1-2 times daily. Non Wound Condition: Other Non Wound Condition Orders/Instructions: - wrap both legs with unna boots change 3x a week. WOUND #1: - Sacrum Wound Laterality: Cleanser: Wound Cleanser 3 x Per Week/30 Days Discharge Instructions: Cleanse the wound with wound cleanser prior to applying a clean dressing using gauze sponges, not tissue or cotton balls. Peri-Wound  Care: Skin Prep 3 x Per Week/30 Days Discharge Instructions: Use skin prep as directed Prim Dressing: Hydrofera Blue Ready Transfer Foam, 4x5 (in/in) 3 x Per Week/30 Days ary Discharge Instructions: Apply to wound bed. ensure to layer the hydrofera blue to fill space. Secondary Dressing: Zetuvit Plus Silicone Border Dressing 4x4 (in/in) 3 x Per Week/30 Days Discharge Instructions: Apply silicone border over primary dressing as directed. 1. I would recommend at this point that we have the patient continue to monitor for any signs of infection or worsening. With that being said I do believe that she is making good headway towards complete closure which is great news. 2. I am also can recommend that we utilize the [facility] along with the Zetuvit border foam dressing to cover. 3. I am also going to suggest that the patient should continue to perform aggressive and appropriate offloading she does need to be sitting For any extended period of time on this area. [name], GARVIS ([id number]) [id number] Page 7 of 7 4. We will go ahead and discontinue wound VAC as to be honest it has not been really staying in place and doing what it supposed to do anyway and there are multiple reasons for this but nonetheless either way I would just go ahead and forego the use of the wound VAC at this point. We will see patient back for reevaluation in 1 week here in the clinic. If anything worsens or changes patient will contact our office for additional recommendations. Electronic Signature(s) Signed: 11/27/2022 2:50:30 PM By: Allen Derry PA-C Entered By: Allen Derry on 11/27/2022 14:50:30 -------------------------------------------------------------------------------- SuperBill Details Patient Name: Date of Service: MONYETTE, CLAYBURN NA 11/27/2022 Medical Record Number: [id number] Patient Account Number: [id number] Date of Birth/Sex: Treating RN: 1946-02-24 (77 y.o. Arta Silence Primary  Care Provider: Daphine Deutscher, Mary-Margaret Other Clinician: Referring Provider: Treating Provider/Extender: Lauretta Grill, Mary-Margaret Weeks in Treatment: 6 Diagnosis Coding ICD-10 Codes Code Description L89.154 Pressure  ulcer of sacral region, stage 4 J44.9 Chronic obstructive pulmonary disease, unspecified I10 Essential (primary) hypertension N18.30 Chronic kidney disease, stage 3 unspecified I48.0 Paroxysmal atrial fibrillation F01.A0 Vascular dementia, mild, without behavioral disturbance, psychotic disturbance, mood disturbance, and anxiety Facility Procedures : CPT4 Code: [id number] Description: 11042 - DEB SUBQ TISSUE 20 SQ CM/< ICD-10 Diagnosis Description L89.154 Pressure ulcer of sacral region, stage 4 Modifier: Quantity: 1 Physician Procedures : CPT4 Code Description Modifier [id number] - WC PHYS SUBQ TISS 20 SQ CM ICD-10 Diagnosis Description L89.154 Pressure ulcer of sacral region, stage 4 Quantity: 1 Electronic Signature(s) Signed: 11/27/2022 2:54:44 PM By: Allen Derry PA-C Entered By: Allen Derry on 11/27/2022 14:54:44

## 2022-11-29 DIAGNOSIS — I82402 Acute embolism and thrombosis of unspecified deep veins of left lower extremity: Secondary | ICD-10-CM | POA: Diagnosis not present

## 2022-11-29 DIAGNOSIS — M6281 Muscle weakness (generalized): Secondary | ICD-10-CM | POA: Diagnosis not present

## 2022-12-02 ENCOUNTER — Ambulatory Visit: Payer: Medicare Other

## 2022-12-02 VITALS — Ht 62.0 in | Wt 158.0 lb

## 2022-12-02 DIAGNOSIS — Z0000 Encounter for general adult medical examination without abnormal findings: Secondary | ICD-10-CM | POA: Diagnosis not present

## 2022-12-02 DIAGNOSIS — I82402 Acute embolism and thrombosis of unspecified deep veins of left lower extremity: Secondary | ICD-10-CM | POA: Diagnosis not present

## 2022-12-02 DIAGNOSIS — M6281 Muscle weakness (generalized): Secondary | ICD-10-CM | POA: Diagnosis not present

## 2022-12-02 NOTE — Progress Notes (Signed)
Subjective:   [name] is a 77 y.o. female who presents for Medicare Annual (Subsequent) preventive examination.  Visit Complete: Virtual  I connected with  [name] on 12/02/22 by a audio enabled telemedicine application and verified that I am speaking with the correct person using two identifiers.  Patient Location: Home  Provider Location: Home Office  I discussed the limitations of evaluation and management by telemedicine. The patient expressed understanding and agreed to proceed.  Patient Medicare AWV questionnaire was completed by the patient on 12/02/2022; I have confirmed that all information answered by patient is correct and no changes since this date.  Review of Systems     Cardiac Risk Factors include: advanced age (>18men, >35 women);hypertension;dyslipidemia     Objective:    Today's Vitals   12/02/22 1526  Weight: 158 lb (71.7 kg)  Height: 5\' 2"  (1.575 m)   Body mass index is 28.9 kg/m.     12/02/2022    3:30 PM 11/12/2022    1:02 PM 10/19/2022    3:09 PM 09/26/2022    4:00 PM 09/23/2022   11:53 PM 07/09/2022    8:13 AM 07/07/2022    7:30 AM  Advanced Directives  Does Patient Have a Medical Advance Directive? Yes No No Unable to assess, patient is non-responsive or altered mental status No    Type of Public librarian Power of [place];Living will     Healthcare Power of [employer] Power of Attorney  Copy of Healthcare Power of Attorney in Chart? No - copy requested     No - copy requested No - copy requested  Would patient like information on creating a medical advance directive?  No - Patient declined No - Patient declined        Current Medications (verified) Outpatient Encounter Medications as of 12/02/2022  Medication Sig   acetaminophen (TYLENOL) 325 MG tablet Take 2 tablets (650 mg total) by mouth every 6 (six) hours as needed for mild pain (or Fever >/= 101).   Amino Acids-Protein Hydrolys (PRO-STAT) LIQD Take 30 mLs by  mouth 2 (two) times daily.   Apixaban Starter Pack, 10mg  and 5mg , (ELIQUIS DVT/PE STARTER PACK) Take as directed on package: start with two-5mg  tablets twice daily for 7 days. On day 8, switch to one-5mg  tablet twice daily.   ascorbic acid (VITAMIN C) 500 MG tablet Take 500 mg by mouth 2 (two) times daily.   Cholecalciferol (VITAMIN D3) 50 MCG (2000 UT) CAPS Take 2,000 Units by mouth every morning.   citalopram (CELEXA) 40 MG tablet Take 1 tablet (40 mg total) by mouth daily.   feeding supplement, ENSURE COMPLETE, (ENSURE COMPLETE) LIQD Take 237 mLs by mouth 2 (two) times daily between meals.   ferrous sulfate 325 (65 FE) MG tablet Take 1 tablet (325 mg total) by mouth daily with breakfast.   furosemide (LASIX) 20 MG tablet Take 1 tablet (20 mg total) by mouth daily.   glucose 4 GM chewable tablet Chew 1 tablet (4 g total) by mouth as needed for low blood sugar.   levothyroxine (SYNTHROID) 75 MCG tablet Take 1 tablet (75 mcg total) by mouth daily.   Magnesium Oxide 400 MG CAPS Take 1 capsule (400 mg total) by mouth daily.   mirtazapine (REMERON) 7.5 MG tablet Take 1 tablet (7.5 mg total) by mouth at bedtime. For appetite stimulation   Multiple Vitamin (MULTIVITAMIN) tablet Take 1 tablet by mouth every morning.   nutrition supplement, JUVEN, (JUVEN) PACK Take 1 packet  by mouth 2 (two) times daily between meals.   nystatin (MYCOSTATIN/NYSTOP) powder Apply topically 3 (three) times daily. On groins   OxyCODONE HCl, Abuse Deter, (OXAYDO) 5 MG TABA Take 5 mg by mouth daily as needed (severe pain).   rOPINIRole (REQUIP XL) 2 MG 24 hr tablet Take 2 mg by mouth at bedtime.   sodium bicarbonate 650 MG tablet Take 1 tablet (650 mg total) by mouth 3 (three) times daily.   vancomycin (VANCOCIN) 125 MG capsule Take 1 capsule (125 mg total) by mouth See admin instructions. Take Vancomycin  125 mg-- 1 capsule twice daily for 7 days, then 1 capsule once daily for 7 days, then 1 capsule every other day for 7 days,  then 1 capsule every 3 days for 7 days then Stop---Last dose is 11/25/22   Zinc 220 (50 Zn) MG CAPS Take 220 mg by mouth every morning.   No facility-administered encounter medications on file as of 12/02/2022.    Allergies (verified) Celebrex [celecoxib], Keflet [cephalexin], Penicillins, Sulfa antibiotics, Kenalog [triamcinolone acetonide], Latex, Lisinopril, Mobic [meloxicam], and Penicillin g   History: Past Medical History:  Diagnosis Date   Allergy    Bradycardia    Bronchitis, chronic (HCC)    Chronic anxiety    Complication of anesthesia    hard to wake up   COPD (chronic obstructive pulmonary disease) (HCC)    Depression    Esophageal reflux    Headache(784.0)    Hyperlipidemia    Hypertension    Hypothyroidism    Obesity    Osteoporosis    Overactive bladder    PVC's (premature ventricular contractions)    Thyroid disease    Vitamin D deficiency disease    Past Surgical History:  Procedure Laterality Date   ABDOMINAL HYSTERECTOMY     CENTRAL LINE INSERTION Right 01/10/2022   Procedure: CENTRAL LINE INSERTION;  Surgeon: Lucretia Roers, MD;  Location: [facility];  Service: General;  Laterality: Right;   CHOLECYSTECTOMY N/A 11/04/2013   Procedure: LAPAROSCOPIC CHOLECYSTECTOMY WITH INTRAOPERATIVE CHOLANGIOGRAM;  Surgeon: Wilmon Arms. Corliss Skains, MD;  Location: [facility];  Service: General;  Laterality: N/A;   COLONOSCOPY     FRACTURE SURGERY     pins removed from Hip surgery   HIP FRACTURE SURGERY  1990    pins removed in 1991   IR FLUORO GUIDE CV LINE LEFT  01/31/2022   IR IVC FILTER PLMT / S&I /IMG GUID/MOD SED  10/25/2022   IR US GUIDE VASC ACCESS LEFT  01/31/2022   LAPAROTOMY N/A 01/10/2022   Procedure: EXPLORATORY LAPAROTOMY,;  Surgeon: Lucretia Roers, MD;  Location: [facility];  Service: General;  Laterality: N/A;   LYSIS OF ADHESION N/A 01/10/2022   Procedure: LYSIS OF ADHESION;  Surgeon: Lucretia Roers, MD;  Location: [facility];  Service: General;  Laterality: N/A;    PARTIAL HYSTERECTOMY  1987   SPINAL FUSION     THORACENTESIS N/A 01/18/2022   Procedure: THORACENTESIS;  Surgeon: Lorin Glass, MD;  Location: [facility];  Service: Pulmonary;  Laterality: N/A;   UPPER GASTROINTESTINAL ENDOSCOPY     Family History  Problem Relation Age of Onset   Arthritis Other    Heart disease Other    Cancer Other    Diabetes Other    OCD Other    Social History   Socioeconomic History   Marital status: Single    Spouse name: Not on file   Number of children: 2   Years of education: Not  on file   Highest education level: Not on file  Occupational History   Not on file  Tobacco Use   Smoking status: Never   Smokeless tobacco: Never  Vaping Use   Vaping Use: Never used  Substance and Sexual Activity   Alcohol use: No   Drug use: No   Sexual activity: Not on file  Other Topics Concern   Not on file  Social History Narrative   Not on file   Social Determinants of Health   Financial Resource Strain: Low Risk  (12/02/2022)   Overall Financial Resource Strain (CARDIA)    Difficulty of Paying Living Expenses: Not hard at all  Food Insecurity: No Food Insecurity (12/02/2022)   Hunger Vital Sign    Worried About Running Out of Food in the Last Year: Never true    Ran Out of Food in the Last Year: Never true  Transportation Needs: No Transportation Needs (12/02/2022)   PRAPARE - Administrator, Civil Service (Medical): No    Lack of Transportation (Non-Medical): No  Physical Activity: Insufficiently Active (12/02/2022)   Exercise Vital Sign    Days of Exercise per Week: 2 days    Minutes of Exercise per Session: 30 min  Stress: No Stress Concern Present (12/02/2022)   [employer] of Occupational Health - Occupational Stress Questionnaire    Feeling of Stress : Not at all  Social Connections: Moderately Isolated (12/02/2022)   Social Connection and Isolation Panel [NHANES]    Frequency of Communication with Friends and Family: More than  three times a week    Frequency of Social Gatherings with Friends and Family: More than three times a week    Attends Religious Services: More than 4 times per year    Active Member of [employer] or Organizations: No    Attends Banker Meetings: Never    Marital Status: Widowed    Tobacco Counseling Counseling given: Not Answered   Clinical Intake:  Pre-visit preparation completed: Yes  Pain : No/denies pain     Nutritional Risks: None Diabetes: No  How often do you need to have someone help you when you read instructions, pamphlets, or other written materials from your doctor or pharmacy?: 1 - Never  Interpreter Needed?: No  Information entered by :: Renie Ora, LPN   Activities of Daily Living    12/02/2022    3:31 PM 10/19/2022    9:31 PM  In your present state of health, do you have any difficulty performing the following activities:  Hearing? 0   Vision? 0   Difficulty concentrating or making decisions? 0   Walking or climbing stairs? 0   Dressing or bathing? 0   Doing errands, shopping? 0 1  Preparing Food and eating ? N   Using the Toilet? N   In the past six months, have you accidently leaked urine? N   Do you have problems with loss of bowel control? N   Managing your Medications? N   Managing your Finances? N   Housekeeping or managing your Housekeeping? N     Patient Care Team: [name], FNP as PCP - General (Family Medicine) Wyline Mood Dorothe Pea, MD as PCP - Cardiology (Cardiology)  Indicate any recent Medical Services you may have received from other than [facility] providers in the past year (date may be approximate).     Assessment:   This is a routine wellness examination for Ayeza.  Hearing/Vision screen Vision Screening - Comments:: Wears rx  glasses - up to date with routine eye exams with  Dr.Lee   Dietary issues and exercise activities discussed:     Goals Addressed             This Visit's Progress    DIET -  INCREASE LEAN PROTEINS         Depression Screen    12/02/2022    3:30 PM 12/02/2022    3:29 PM 05/06/2022   12:12 PM 11/28/2021    3:35 PM 09/28/2021   11:37 AM 03/01/2021   12:36 PM 02/25/2020    3:15 PM  PHQ 2/9 Scores  PHQ - 2 Score 0 0 0 2 0 2 0  PHQ- 9 Score   0 3 0 2     Fall Risk    12/02/2022    3:28 PM 05/06/2022   12:11 PM 11/28/2021    3:40 PM 09/28/2021   11:37 AM 03/01/2021   12:36 PM  Fall Risk   Falls in the past year? 1 0 1 0 0  Number falls in past yr: 1  0    Injury with Fall? 1  0    Risk for fall due to : History of fall(s);Impaired balance/gait;Orthopedic patient  History of fall(s);Impaired balance/gait    Follow up Education provided;Falls prevention discussed;Falls evaluation completed  Falls prevention discussed      MEDICARE RISK AT HOME:  Medicare Risk at Home - 12/02/22 1528     Any stairs in or around the home? Yes    If so, are there any without handrails? No    Home free of loose throw rugs in walkways, pet beds, electrical cords, etc? Yes    Adequate lighting in your home to reduce risk of falls? Yes    Life alert? No    Use of a cane, walker or w/c? No    Grab bars in the bathroom? Yes    Shower chair or bench in shower? Yes    Elevated toilet seat or a handicapped toilet? Yes             TIMED UP AND GO:  Was the test performed?  No    Cognitive Function:        12/02/2022    3:31 PM 11/28/2021    3:43 PM  6CIT Screen  What Year? 0 points 0 points  What month? 0 points 0 points  What time? 0 points 0 points  Count back from 20 0 points 0 points  Months in reverse 0 points 0 points  Repeat phrase 0 points 0 points  Total Score 0 points 0 points    Immunizations  There is no immunization history on file for this patient.  TDAP status: Due, Education has been provided regarding the importance of this vaccine. Advised may receive this vaccine at local pharmacy or Health Dept. Aware to provide a copy of the vaccination record if  obtained from local pharmacy or Health Dept. Verbalized acceptance and understanding.  Flu Vaccine status: Declined, Education has been provided regarding the importance of this vaccine but patient still declined. Advised may receive this vaccine at local pharmacy or Health Dept. Aware to provide a copy of the vaccination record if obtained from local pharmacy or Health Dept. Verbalized acceptance and understanding.  Pneumococcal vaccine status: Due, Education has been provided regarding the importance of this vaccine. Advised may receive this vaccine at local pharmacy or Health Dept. Aware to provide a copy of the vaccination record if  obtained from local pharmacy or Health Dept. Verbalized acceptance and understanding.  Covid-19 vaccine status: Declined, Education has been provided regarding the importance of this vaccine but patient still declined. Advised may receive this vaccine at local pharmacy or Health Dept.or vaccine clinic. Aware to provide a copy of the vaccination record if obtained from local pharmacy or Health Dept. Verbalized acceptance and understanding.  Qualifies for Shingles Vaccine? Yes   Zostavax completed No   Shingrix Completed?: No.    Education has been provided regarding the importance of this vaccine. Patient has been advised to call insurance company to determine out of pocket expense if they have not yet received this vaccine. Advised may also receive vaccine at local pharmacy or Health Dept. Verbalized acceptance and understanding.  Screening Tests Health Maintenance  Topic Date Due   COVID-19 Vaccine (1) Never done   DTaP/Tdap/Td (1 - Tdap) Never done   Zoster Vaccines- Shingrix (1 of 2) Never done   DEXA SCAN  05/04/2023 (Originally 09/03/2011)   Pneumonia Vaccine 41+ Years old (1 of 1 - PCV) 05/23/2023 (Originally 09/03/2011)   INFLUENZA VACCINE  12/26/2022   Medicare Annual Wellness (AWV)  12/02/2023   Hepatitis C Screening  Completed   HPV VACCINES  Aged Out    Colonoscopy  Discontinued    Health Maintenance  Health Maintenance Due  Topic Date Due   COVID-19 Vaccine (1) Never done   DTaP/Tdap/Td (1 - Tdap) Never done   Zoster Vaccines- Shingrix (1 of 2) Never done    Colorectal cancer screening: No longer required.   Mammogram status: No longer required due to age.  Bone Density status: Ordered declined . Pt provided with contact info and advised to call to schedule appt.  Lung Cancer Screening: (Low Dose CT Chest recommended if Age 64-80 years, 20 pack-year currently smoking OR have quit w/in 15years.) does not qualify.   Lung Cancer Screening Referral: n/a  Additional Screening:  Hepatitis C Screening: does not qualify; Completed 07/07/2022  Vision Screening: Recommended annual ophthalmology exams for early detection of glaucoma and other disorders of the eye. Is the patient up to date with their annual eye exam?  No  Who is the provider or what is the name of the office in which the patient attends annual eye exams? Dr.Lee  If pt is not established with a provider, would they like to be referred to a provider to establish care? No .   Dental Screening: Recommended annual dental exams for proper oral hygiene   Community Resource Referral / Chronic Care Management: CRR required this visit?  No   CCM required this visit?  No     Plan:     I have personally reviewed and noted the following in the patient's chart:   Medical and social history Use of alcohol, tobacco or illicit drugs  Current medications and supplements including opioid prescriptions. Patient is not currently taking opioid prescriptions. Functional ability and status Nutritional status Physical activity Advanced directives List of other physicians Hospitalizations, surgeries, and ER visits in previous 12 months Vitals Screenings to include cognitive, depression, and falls Referrals and appointments  In addition, I have reviewed and discussed with  patient certain preventive protocols, quality metrics, and best practice recommendations. A written personalized care plan for preventive services as well as general preventive health recommendations were provided to patient.     Lorrene Reid, LPN   12/25/1911   After Visit Summary: (MyChart) Due to this being a telephonic visit, the after  visit summary with patients personalized plan was offered to patient via MyChart   Nurse Notes: No Vaccines on File

## 2022-12-02 NOTE — Patient Instructions (Signed)
[name] , Thank you for taking time to come for your Medicare Wellness Visit. I appreciate your ongoing commitment to your health goals. Please review the following plan we discussed and let me know if I can assist you in the future.   These are the goals we discussed:  Goals      DIET - INCREASE LEAN PROTEINS     Exercise 3x per week (30 min per time)     Encouraged chair exercises.         This is a list of the screening recommended for you and due dates:  Health Maintenance  Topic Date Due   COVID-19 Vaccine (1) Never done   DTaP/Tdap/Td vaccine (1 - Tdap) Never done   Zoster (Shingles) Vaccine (1 of 2) Never done   DEXA scan (bone density measurement)  05/04/2023*   Pneumonia Vaccine (1 of 1 - PCV) 05/23/2023*   Flu Shot  12/26/2022   Medicare Annual Wellness Visit  12/02/2023   Hepatitis C Screening  Completed   HPV Vaccine  Aged Out   Colon Cancer Screening  Discontinued  *Topic was postponed. The date shown is not the original due date.    Advanced directives: Please bring a copy of your health care power of attorney and living will to the office to be added to your chart at your convenience.   Conditions/risks identified: Aim for 30 minutes of exercise or brisk walking, 6-8 glasses of water, and 5 servings of fruits and vegetables each day.   Next appointment: Follow up in one year for your annual wellness visit    Preventive Care 65 Years and Older, Female Preventive care refers to lifestyle choices and visits with your health care provider that can promote health and wellness. What does preventive care include? A yearly physical exam. This is also called an annual well check. Dental exams once or twice a year. Routine eye exams. Ask your health care provider how often you should have your eyes checked. Personal lifestyle choices, including: Daily care of your teeth and gums. Regular physical activity. Eating a healthy diet. Avoiding tobacco and drug  use. Limiting alcohol use. Practicing safe sex. Taking low-dose aspirin every day. Taking vitamin and mineral supplements as recommended by your health care provider. What happens during an annual well check? The services and screenings done by your health care provider during your annual well check will depend on your age, overall health, lifestyle risk factors, and family history of disease. Counseling  Your health care provider may ask you questions about your: Alcohol use. Tobacco use. Drug use. Emotional well-being. Home and relationship well-being. Sexual activity. Eating habits. History of falls. Memory and ability to understand (cognition). Work and work Astronomer. Reproductive health. Screening  You may have the following tests or measurements: Height, weight, and BMI. Blood pressure. Lipid and cholesterol levels. These may be checked every 5 years, or more frequently if you are over 62 years old. Skin check. Lung cancer screening. You may have this screening every year starting at age 81 if you have a 30-pack-year history of smoking and currently smoke or have quit within the past 15 years. Fecal occult blood test (FOBT) of the stool. You may have this test every year starting at age 40. Flexible sigmoidoscopy or colonoscopy. You may have a sigmoidoscopy every 5 years or a colonoscopy every 10 years starting at age 41. Hepatitis C blood test. Hepatitis B blood test. Sexually transmitted disease (STD) testing. Diabetes screening. This is  done by checking your blood sugar (glucose) after you have not eaten for a while (fasting). You may have this done every 1-3 years. Bone density scan. This is done to screen for osteoporosis. You may have this done starting at age 69. Mammogram. This may be done every 1-2 years. Talk to your health care provider about how often you should have regular mammograms. Talk with your health care provider about your test results, treatment  options, and if necessary, the need for more tests. Vaccines  Your health care provider may recommend certain vaccines, such as: Influenza vaccine. This is recommended every year. Tetanus, diphtheria, and acellular pertussis (Tdap, Td) vaccine. You may need a Td booster every 10 years. Zoster vaccine. You may need this after age 40. Pneumococcal 13-valent conjugate (PCV13) vaccine. One dose is recommended after age 55. Pneumococcal polysaccharide (PPSV23) vaccine. One dose is recommended after age 78. Talk to your health care provider about which screenings and vaccines you need and how often you need them. This information is not intended to replace advice given to you by your health care provider. Make sure you discuss any questions you have with your health care provider. Document Released: 06/09/2015 Document Revised: 01/31/2016 Document Reviewed: 03/14/2015 Elsevier Interactive Patient Education  2017 [employer].  Fall Prevention in the Home Falls can cause injuries. They can happen to people of all ages. There are many things you can do to make your home safe and to help prevent falls. What can I do on the outside of my home? Regularly fix the edges of walkways and driveways and fix any cracks. Remove anything that might make you trip as you walk through a door, such as a raised step or threshold. Trim any bushes or trees on the path to your home. Use bright outdoor lighting. Clear any walking paths of anything that might make someone trip, such as rocks or tools. Regularly check to see if handrails are loose or broken. Make sure that both sides of any steps have handrails. Any raised decks and porches should have guardrails on the edges. Have any leaves, snow, or ice cleared regularly. Use sand or salt on walking paths during winter. Clean up any spills in your garage right away. This includes oil or grease spills. What can I do in the bathroom? Use night lights. Install grab  bars by the toilet and in the tub and shower. Do not use towel bars as grab bars. Use non-skid mats or decals in the tub or shower. If you need to sit down in the shower, use a plastic, non-slip stool. Keep the floor dry. Clean up any water that spills on the floor as soon as it happens. Remove soap buildup in the tub or shower regularly. Attach bath mats securely with double-sided non-slip rug tape. Do not have throw rugs and other things on the floor that can make you trip. What can I do in the bedroom? Use night lights. Make sure that you have a light by your bed that is easy to reach. Do not use any sheets or blankets that are too big for your bed. They should not hang down onto the floor. Have a firm chair that has side arms. You can use this for support while you get dressed. Do not have throw rugs and other things on the floor that can make you trip. What can I do in the kitchen? Clean up any spills right away. Avoid walking on wet floors. Keep items that you  use a lot in easy-to-reach places. If you need to reach something above you, use a strong step stool that has a grab bar. Keep electrical cords out of the way. Do not use floor polish or wax that makes floors slippery. If you must use wax, use non-skid floor wax. Do not have throw rugs and other things on the floor that can make you trip. What can I do with my stairs? Do not leave any items on the stairs. Make sure that there are handrails on both sides of the stairs and use them. Fix handrails that are broken or loose. Make sure that handrails are as long as the stairways. Check any carpeting to make sure that it is firmly attached to the stairs. Fix any carpet that is loose or worn. Avoid having throw rugs at the top or bottom of the stairs. If you do have throw rugs, attach them to the floor with carpet tape. Make sure that you have a light switch at the top of the stairs and the bottom of the stairs. If you do not have them,  ask someone to add them for you. What else can I do to help prevent falls? Wear shoes that: Do not have high heels. Have rubber bottoms. Are comfortable and fit you well. Are closed at the toe. Do not wear sandals. If you use a stepladder: Make sure that it is fully opened. Do not climb a closed stepladder. Make sure that both sides of the stepladder are locked into place. Ask someone to hold it for you, if possible. Clearly mark and make sure that you can see: Any grab bars or handrails. First and last steps. Where the edge of each step is. Use tools that help you move around (mobility aids) if they are needed. These include: Canes. Walkers. Scooters. Crutches. Turn on the lights when you go into a dark area. Replace any light bulbs as soon as they burn out. Set up your furniture so you have a clear path. Avoid moving your furniture around. If any of your floors are uneven, fix them. If there are any pets around you, be aware of where they are. Review your medicines with your doctor. Some medicines can make you feel dizzy. This can increase your chance of falling. Ask your doctor what other things that you can do to help prevent falls. This information is not intended to replace advice given to you by your health care provider. Make sure you discuss any questions you have with your health care provider. Document Released: 03/09/2009 Document Revised: 10/19/2015 Document Reviewed: 06/17/2014 Elsevier Interactive Patient Education  2017 [employer].

## 2022-12-03 DIAGNOSIS — Z79899 Other long term (current) drug therapy: Secondary | ICD-10-CM | POA: Diagnosis not present

## 2022-12-03 DIAGNOSIS — I82402 Acute embolism and thrombosis of unspecified deep veins of left lower extremity: Secondary | ICD-10-CM | POA: Diagnosis not present

## 2022-12-03 DIAGNOSIS — M6281 Muscle weakness (generalized): Secondary | ICD-10-CM | POA: Diagnosis not present

## 2022-12-03 DIAGNOSIS — I890 Lymphedema, not elsewhere classified: Secondary | ICD-10-CM | POA: Diagnosis not present

## 2022-12-04 ENCOUNTER — Encounter (HOSPITAL_BASED_OUTPATIENT_CLINIC_OR_DEPARTMENT_OTHER): Payer: Medicare Other | Admitting: Physician Assistant

## 2022-12-04 DIAGNOSIS — M6281 Muscle weakness (generalized): Secondary | ICD-10-CM | POA: Diagnosis not present

## 2022-12-04 DIAGNOSIS — I82402 Acute embolism and thrombosis of unspecified deep veins of left lower extremity: Secondary | ICD-10-CM | POA: Diagnosis not present

## 2022-12-05 DIAGNOSIS — I82402 Acute embolism and thrombosis of unspecified deep veins of left lower extremity: Secondary | ICD-10-CM | POA: Diagnosis not present

## 2022-12-05 DIAGNOSIS — M6281 Muscle weakness (generalized): Secondary | ICD-10-CM | POA: Diagnosis not present

## 2022-12-06 DIAGNOSIS — I82402 Acute embolism and thrombosis of unspecified deep veins of left lower extremity: Secondary | ICD-10-CM | POA: Diagnosis not present

## 2022-12-06 DIAGNOSIS — M6281 Muscle weakness (generalized): Secondary | ICD-10-CM | POA: Diagnosis not present

## 2022-12-09 DIAGNOSIS — I82402 Acute embolism and thrombosis of unspecified deep veins of left lower extremity: Secondary | ICD-10-CM | POA: Diagnosis not present

## 2022-12-09 DIAGNOSIS — M6281 Muscle weakness (generalized): Secondary | ICD-10-CM | POA: Diagnosis not present

## 2022-12-10 DIAGNOSIS — M6281 Muscle weakness (generalized): Secondary | ICD-10-CM | POA: Diagnosis not present

## 2022-12-10 DIAGNOSIS — I82402 Acute embolism and thrombosis of unspecified deep veins of left lower extremity: Secondary | ICD-10-CM | POA: Diagnosis not present

## 2022-12-11 ENCOUNTER — Encounter (HOSPITAL_BASED_OUTPATIENT_CLINIC_OR_DEPARTMENT_OTHER): Payer: Medicare Other | Admitting: Physician Assistant

## 2022-12-11 DIAGNOSIS — J449 Chronic obstructive pulmonary disease, unspecified: Secondary | ICD-10-CM | POA: Diagnosis not present

## 2022-12-11 DIAGNOSIS — F0150 Vascular dementia without behavioral disturbance: Secondary | ICD-10-CM | POA: Diagnosis not present

## 2022-12-11 DIAGNOSIS — I82402 Acute embolism and thrombosis of unspecified deep veins of left lower extremity: Secondary | ICD-10-CM | POA: Diagnosis not present

## 2022-12-11 DIAGNOSIS — I130 Hypertensive heart and chronic kidney disease with heart failure and stage 1 through stage 4 chronic kidney disease, or unspecified chronic kidney disease: Secondary | ICD-10-CM | POA: Diagnosis not present

## 2022-12-11 DIAGNOSIS — L89154 Pressure ulcer of sacral region, stage 4: Secondary | ICD-10-CM | POA: Diagnosis not present

## 2022-12-11 DIAGNOSIS — I480 Paroxysmal atrial fibrillation: Secondary | ICD-10-CM | POA: Diagnosis not present

## 2022-12-11 DIAGNOSIS — M6281 Muscle weakness (generalized): Secondary | ICD-10-CM | POA: Diagnosis not present

## 2022-12-11 NOTE — Progress Notes (Addendum)
[name], [name] ([id number]) [id number] Page 1 of 7 Visit Report for 12/11/2022 Chief Complaint Document Details Patient Name: Date of Service: Cindy, Saunders NA 12/11/2022 12:45 PM Medical Record Number: [id number] Patient Account Number: [id number] Date of Birth/Sex: Treating RN: 1945-10-13 (77 y.o. F) Primary Care Provider: Daphine Deutscher, Mary-Margaret Other Clinician: Referring Provider: Treating Provider/Extender: Lauretta Grill, Mary-Margaret Weeks in Treatment: 8 Information Obtained from: Patient Chief Complaint Sacral pressure ulcer Electronic Signature(s) Signed: 12/11/2022 12:47:18 PM By: Allen Derry PA-C Entered By: Allen Derry on 12/11/2022 12:47:18 -------------------------------------------------------------------------------- Debridement Details Patient Name: Date of Service: [name], [name] NA 12/11/2022 12:45 PM Medical Record Number: [id number] Patient Account Number: [id number] Date of Birth/Sex: Treating RN: May 27, 1946 (77 y.o. Arta Silence Primary Care Provider: Daphine Deutscher, Mary-Margaret Other Clinician: Referring Provider: Treating Provider/Extender: Lauretta Grill, Mary-Margaret Weeks in Treatment: 8 Debridement Performed for Assessment: Wound #1 Sacrum Performed By: Physician Lenda Kelp, PA Debridement Type: Debridement Level of Consciousness (Pre-procedure): Awake and Alert Pre-procedure Verification/Time Out Yes - 13:20 Taken: Start Time: 13:21 Pain Control: Lidocaine 4% T opical Solution Percent of Wound Bed Debrided: 100% T Area Debrided (cm): otal 3.92 Tissue and other material debrided: Viable, Non-Viable, Slough, Subcutaneous, Skin: Dermis , Skin: Epidermis, Biofilm, Slough Level: Skin/Subcutaneous Tissue Debridement Description: Excisional Instrument: Curette Bleeding: Minimum Hemostasis Achieved: Pressure End Time: 13:26 Procedural Pain: 0 Post Procedural Pain: 0 Response to Treatment: Procedure was tolerated  well Level of Consciousness (Post- Awake and Alert procedure): Post Debridement Measurements of Total Wound Length: (cm) 2.5 Stage: Category/Stage IV Width: (cm) 2 Depth: (cm) 1 Volume: (cm) 3.927 Character of Wound/Ulcer Post Debridement: Improved [name] ([id number]) [id number] Page 2 of 7 Post Procedure Diagnosis Same as Pre-procedure Electronic Signature(s) Signed: 12/11/2022 4:26:17 PM By: Allen Derry PA-C Signed: 12/11/2022 5:02:36 PM By: Shawn Stall RN, BSN Entered By: Shawn Stall on 12/11/2022 13:26:42 -------------------------------------------------------------------------------- HPI Details Patient Name: Date of Service: Cindy, Saunders NA 12/11/2022 12:45 PM Medical Record Number: [id number] Patient Account Number: [id number] Date of Birth/Sex: Treating RN: Aug 09, 1945 (77 y.o. F) Primary Care Provider: Daphine Deutscher, Mary-Margaret Other Clinician: Referring Provider: Treating Provider/Extender: Lauretta Grill, Mary-Margaret Weeks in Treatment: 8 History of Present Illness HPI Description: 10-16-2022 upon evaluation today patient appears to be doing somewhat poorly in regard to a wound in the sacral area/lower back region. Fortunately there does not appear to be any signs of infection at this point which is great news. With that being said she unfortunately did develop this wound as a result of having had a hospital stay which dates back to looks like at least the beginning of April where there was unstageable wound noted. It was between May 2 and 7 that she fell and broke her shoulder and was put in a sling that she had C. difficile infection and subsequently this is when the wound was mostly described. Fortunately there does not appear to be any signs of infection locally or systemically which is great news but at the same time she does have a significantly deep wound that is of concern at this point. That is the reason she comes in for  evaluation to see if there is anything we can offer or recommend to get this healed faster. Patient does have a history of COPD, hypertension, chronic kidney disease stage III, atrial fibrillation, and vascular dementia although there is no behavioral disturbance that I see or perceived that she seems to be answering questions appropriately and asking good questions to be honest. 10-30-2022 upon evaluation today patient  appears to be doing well currently in regard to her wound which appears to be healthy at this point. I do not see any signs of active infection locally nor systemically which is great news. No fevers, chills, nausea, vomiting, or diarrhea. With that being said I am actually not seeing much improvement compared to last time I saw her simply due to the fact that she has not had the wound VAC on she was seen on the 22nd and then subsequently ended up being admitted to the hospital on the 25th due to a blood clot in her leg. This ended with therapy in the hospital through what appears to be the third so she is going to back to the facility for 2 days before coming in today and then subsequently the wound VAC was not used upon readmission. We did have a letter from the treatment nurse that stated the patient transfers independently multiple times throughout the day. Obviously based on the weakness and what I am seeing it took to individuals my nurse and the individual with her from the facility to move her into the bed today safely. I do not think she needs to be transferring independently and I think she may need a sitter in fact we might even need to do this long-term while she is on the wound VAC in order to ensure that she does not try to get up on her own and in the process hurt herself. Fortunately I do not see any signs of active infection locally nor systemically which is great news. I do still think a wound VAC would be the primary best way to get this closed as quickly as  possible. 11-13-2022 upon evaluation patient appears to be doing better in regard to her wounds from a size perspective although are still having a lot of issues here with the facility putting on the wound VAC appropriately. They continue to not really do this appropriately which has been the primary concern. In fact because of that it is not really functioning properly and continues to not do as well as we really think it should be. Nonetheless we will going to see about having [employer] go out and do some training with regard to placement of wound vacs. 11-20-2022 upon evaluation today patient appears to be doing well currently in regard to her wound. She is actually showing signs of improvement I am actually very pleased with where things stand I do believe that wound VAC is doing a good job here. Fortunately I do not see any evidence of active infection locally or systemically which is great news. No fevers, chills, nausea, vomiting, or diarrhea. 11-27-2022 upon evaluation patient's wound is doing really about the same. I do not think it is any worse also do not think it significantly better at this point. I do think that she would benefit from a continuation of therapy with regard to the wound VAC but unfortunately this just does not seem to be staying in place which is unfortunate. 12-11-2022 upon evaluation patient's wound unfortunately does not appear to be doing quite as well today as I was hoping for. It does appear that she has some evidence of breakdown as far as the dressing not staying quite in place. I think that she needs to have this more appropriately secured with some bolster behind to help hold the dressing in place. The patient voiced understanding. Subsequently I do believe that we are making good headway towards complete closure which is good news  but at the same time we need to make sure that the dressing is maintaining contact with the base of the wound otherwise when I can see the  improvement we want. Electronic Signature(s) Signed: 12/11/2022 1:33:41 PM By: Allen Derry PA-C Entered By: Allen Derry on 12/11/2022 13:33:41 Lipsey, Linton Saunders ([id number]) [id number] Page 3 of 7 -------------------------------------------------------------------------------- Physical Exam Details Patient Name: Date of Service: Cindy, Saunders NA 12/11/2022 12:45 PM Medical Record Number: [id number] Patient Account Number: [id number] Date of Birth/Sex: Treating RN: [birth year] (77 y.o. F) Primary Care Provider: Daphine Deutscher, Mary-Margaret Other Clinician: Referring Provider: Treating Provider/Extender: Lauretta Grill, Mary-Margaret Weeks in Treatment: 8 Constitutional Well-nourished and well-hydrated in no acute distress. Respiratory normal breathing without difficulty. Psychiatric this patient is able to make decisions and demonstrates good insight into disease process. Alert and Oriented x 3. pleasant and cooperative. Notes Upon inspection patient's wound bed actually showed signs of the need for sharp debridement I did perform debridement today clearway necrotic debris she tolerated that without complication and postdebridement wound bed appears to be doing much better which is great news. Electronic Signature(s) Signed: 12/11/2022 1:34:02 PM By: Allen Derry PA-C Entered By: Allen Derry on 12/11/2022 13:34:02 -------------------------------------------------------------------------------- Physician Orders Details Patient Name: Date of Service: Cindy, Saunders NA 12/11/2022 12:45 PM Medical Record Number: [id number] Patient Account Number: [id number] Date of Birth/Sex: Treating RN: 06-13-1945 (77 y.o. Arta Silence Primary Care Provider: Daphine Deutscher, Mary-Margaret Other Clinician: Referring Provider: Treating Provider/Extender: Lauretta Grill, Mary-Margaret Weeks in Treatment: 8 Verbal / Phone Orders: No Diagnosis Coding ICD-10 Coding Code  Description L89.154 Pressure ulcer of sacral region, stage 4 J44.9 Chronic obstructive pulmonary disease, unspecified I10 Essential (primary) hypertension N18.30 Chronic kidney disease, stage 3 unspecified I48.0 Paroxysmal atrial fibrillation F01.A0 Vascular dementia, mild, without behavioral disturbance, psychotic disturbance, mood disturbance, and anxiety Follow-up Appointments ppointment in 2 weeks. Leonard Schwartz Wednesday 1230 12/25/2022 ROOM 7 Return A Other: - Hydrofera blue with 4X4 GAUZE FOLDED INTO A SMALL SQUARE TO AID IN PUSHING THE HYDORFERA BLUE TO WOUND BED, bordered foam as new dressing change Monday, Wednesday, and Friday. Anesthetic (In clinic) Topical Lidocaine 4% applied to wound bed Off-Loading Low air-loss mattress (Group 2) - facility to ensure patient is using a group 2 air mattress. Turn and reposition every 2 hours Other: - continue using wheelchair cushion when in wheelchair. patient to minimize being up in wheelchair. 2 hours in the chair then 1 hour in the bed. Cindy, Saunders ([id number]) [id number] Page 4 of 7 Additional Orders / Instructions Follow Nutritious Diet - increase protein to aid in wound healing. Juven Shake 1-2 times daily. Non Wound Condition Bilateral Lower Extremities Other Non Wound Condition Orders/Instructions: - wrap both legs with unna boots change 3x a week. Wound Treatment Wound #1 - Sacrum Cleanser: Wound Cleanser 3 x Per Week/30 Days Discharge Instructions: Cleanse the wound with wound cleanser prior to applying a clean dressing using gauze sponges, not tissue or cotton balls. Peri-Wound Care: Skin Prep 3 x Per Week/30 Days Discharge Instructions: Use skin prep as directed Prim Dressing: Hydrofera Blue Ready Transfer Foam, 4x5 (in/in) ary 3 x Per Week/30 Days Discharge Instructions: Apply to wound bed. Secondary Dressing: Woven Gauze Sponge, Non-Sterile 4x4 in 3 x Per Week/30 Days Discharge Instructions: FOLD  INTO A FOUTH (SMALL SQUARE) TO ENSURE THE HYDROFERA BLUE IS TOUCHING DOWN INTO THE WOUND BED. Secondary Dressing: Zetuvit Plus Silicone Border Dressing 4x4 (in/in) 3 x Per Week/30 Days Discharge Instructions: Apply silicone border over primary dressing as  directed. Electronic Signature(s) Signed: 12/11/2022 4:26:17 PM By: Allen Derry PA-C Signed: 12/11/2022 5:02:36 PM By: Shawn Stall RN, BSN Entered By: Shawn Stall on 12/11/2022 13:26:23 -------------------------------------------------------------------------------- Problem List Details Patient Name: Date of Service: Cindy, Saunders NA 12/11/2022 12:45 PM Medical Record Number: [id number] Patient Account Number: [id number] Date of Birth/Sex: Treating RN: December 25, 1945 (77 y.o. F) Primary Care Provider: Daphine Deutscher, Mary-Margaret Other Clinician: Referring Provider: Treating Provider/Extender: Lauretta Grill, Mary-Margaret Weeks in Treatment: 8 Active Problems ICD-10 Encounter Code Description Active Date MDM Diagnosis L89.154 Pressure ulcer of sacral region, stage 4 10/16/2022 No Yes J44.9 Chronic obstructive pulmonary disease, unspecified 10/16/2022 No Yes I10 Essential (primary) hypertension 10/16/2022 No Yes N18.30 Chronic kidney disease, stage 3 unspecified 10/16/2022 No Yes I48.0 Paroxysmal atrial fibrillation 10/16/2022 No Yes F01.A0 Vascular dementia, mild, without behavioral disturbance, psychotic 10/16/2022 No Yes Cindy, Linton Saunders ([id number]) [id number] Page 5 of 7 disturbance, mood disturbance, and anxiety Inactive Problems Resolved Problems Electronic Signature(s) Signed: 12/11/2022 12:47:05 PM By: Allen Derry PA-C Entered By: Allen Derry on 12/11/2022 12:47:05 -------------------------------------------------------------------------------- Progress Note Details Patient Name: Date of Service: Cindy, Saunders NA 12/11/2022 12:45 PM Medical Record Number: [id number] Patient Account Number: [id number] Date  of Birth/Sex: Treating RN: 1946-01-25 (77 y.o. F) Primary Care Provider: Daphine Deutscher, Mary-Margaret Other Clinician: Referring Provider: Treating Provider/Extender: Lauretta Grill, Mary-Margaret Weeks in Treatment: 8 Subjective Chief Complaint Information obtained from Patient Sacral pressure ulcer History of Present Illness (HPI) 10-16-2022 upon evaluation today patient appears to be doing somewhat poorly in regard to a wound in the sacral area/lower back region. Fortunately there does not appear to be any signs of infection at this point which is great news. With that being said she unfortunately did develop this wound as a result of having had a hospital stay which dates back to looks like at least the beginning of April where there was unstageable wound noted. It was between May 2 and 7 that she fell and broke her shoulder and was put in a sling that she had C. difficile infection and subsequently this is when the wound was mostly described. Fortunately there does not appear to be any signs of infection locally or systemically which is great news but at the same time she does have a significantly deep wound that is of concern at this point. That is the reason she comes in for evaluation to see if there is anything we can offer or recommend to get this healed faster. Patient does have a history of COPD, hypertension, chronic kidney disease stage III, atrial fibrillation, and vascular dementia although there is no behavioral disturbance that I see or perceived that she seems to be answering questions appropriately and asking good questions to be honest. 10-30-2022 upon evaluation today patient appears to be doing well currently in regard to her wound which appears to be healthy at this point. I do not see any signs of active infection locally nor systemically which is great news. No fevers, chills, nausea, vomiting, or diarrhea. With that being said I am actually not seeing much improvement  compared to last time I saw her simply due to the fact that she has not had the wound VAC on she was seen on the 22nd and then subsequently ended up being admitted to the hospital on the 25th due to a blood clot in her leg. This ended with therapy in the hospital through what appears to be the third so she is going to back to the facility for 2 days before coming  in today and then subsequently the wound VAC was not used upon readmission. We did have a letter from the treatment nurse that stated the patient transfers independently multiple times throughout the day. Obviously based on the weakness and what I am seeing it took to individuals my nurse and the individual with her from the facility to move her into the bed today safely. I do not think she needs to be transferring independently and I think she may need a sitter in fact we might even need to do this long-term while she is on the wound VAC in order to ensure that she does not try to get up on her own and in the process hurt herself. Fortunately I do not see any signs of active infection locally nor systemically which is great news. I do still think a wound VAC would be the primary best way to get this closed as quickly as possible. 11-13-2022 upon evaluation patient appears to be doing better in regard to her wounds from a size perspective although are still having a lot of issues here with the facility putting on the wound VAC appropriately. They continue to not really do this appropriately which has been the primary concern. In fact because of that it is not really functioning properly and continues to not do as well as we really think it should be. Nonetheless we will going to see about having [employer] go out and do some training with regard to placement of wound vacs. 11-20-2022 upon evaluation today patient appears to be doing well currently in regard to her wound. She is actually showing signs of improvement I am actually very pleased with where  things stand I do believe that wound VAC is doing a good job here. Fortunately I do not see any evidence of active infection locally or systemically which is great news. No fevers, chills, nausea, vomiting, or diarrhea. 11-27-2022 upon evaluation patient's wound is doing really about the same. I do not think it is any worse also do not think it significantly better at this point. I do think that she would benefit from a continuation of therapy with regard to the wound VAC but unfortunately this just does not seem to be staying in place which is unfortunate. 12-11-2022 upon evaluation patient's wound unfortunately does not appear to be doing quite as well today as I was hoping for. It does appear that she has some evidence of breakdown as far as the dressing not staying quite in place. I think that she needs to have this more appropriately secured with some bolster behind to help hold the dressing in place. The patient voiced understanding. Subsequently I do believe that we are making good headway towards complete closure which is good news but at the same time we need to make sure that the dressing is maintaining contact with the base of the wound otherwise when I can see the improvement we want. Cindy, Saunders ([id number]) [id number] Page 6 of 7 Objective Constitutional Well-nourished and well-hydrated in no acute distress. Vitals Time Taken: 1:01 PM, Height: 62 in, Weight: 139 lbs, BMI: 25.4, Temperature: 97.8 F, Pulse: 64 bpm, Respiratory Rate: 18 breaths/min, Blood Pressure: 143/77 mmHg. Respiratory normal breathing without difficulty. Psychiatric this patient is able to make decisions and demonstrates good insight into disease process. Alert and Oriented x 3. pleasant and cooperative. General Notes: Upon inspection patient's wound bed actually showed signs of the need for sharp debridement I did perform debridement today clearway necrotic debris she tolerated  that  without complication and postdebridement wound bed appears to be doing much better which is great news. Integumentary (Hair, Skin) Wound #1 status is Open. Original cause of wound was Pressure Injury. The date acquired was: 08/26/2022. The wound has been in treatment 8 weeks. The wound is located on the Sacrum. The wound measures 2.5cm length x 2cm width x 1cm depth; 3.927cm^2 area and 3.927cm^3 volume. There is muscle and Fat Layer (Subcutaneous Tissue) exposed. There is no tunneling or undermining noted. There is a medium amount of serosanguineous drainage noted. The wound margin is thickened. There is no granulation within the wound bed. There is a large (67-100%) amount of necrotic tissue within the wound bed including Adherent Slough. The periwound skin appearance did not exhibit: Callus, Crepitus, Excoriation, Induration, Rash, Scarring, Dry/Scaly, Maceration, Atrophie Blanche, Cyanosis, Ecchymosis, Hemosiderin Staining, Mottled, Pallor, Rubor, Erythema. Assessment Active Problems ICD-10 Pressure ulcer of sacral region, stage 4 Chronic obstructive pulmonary disease, unspecified Essential (primary) hypertension Chronic kidney disease, stage 3 unspecified Paroxysmal atrial fibrillation Vascular dementia, mild, without behavioral disturbance, psychotic disturbance, mood disturbance, and anxiety Procedures Wound #1 Pre-procedure diagnosis of Wound #1 is a Pressure Ulcer located on the Sacrum . There was a Excisional Skin/Subcutaneous Tissue Debridement with a total area of 3.92 sq cm performed by Lenda Kelp, PA. With the following instrument(s): Curette to remove Viable and Non-Viable tissue/material. Material removed includes Subcutaneous Tissue, Slough, Skin: Dermis, Skin: Epidermis, and Biofilm after achieving pain control using Lidocaine 4% T opical Solution. A time out was conducted at 13:20, prior to the start of the procedure. A Minimum amount of bleeding was controlled with  Pressure. The procedure was tolerated well with a pain level of 0 throughout and a pain level of 0 following the procedure. Post Debridement Measurements: 2.5cm length x 2cm width x 1cm depth; 3.927cm^3 volume. Post debridement Stage noted as Category/Stage IV. Character of Wound/Ulcer Post Debridement is improved. Post procedure Diagnosis Wound #1: Same as Pre-Procedure Plan Follow-up Appointments: Return Appointment in 2 weeks. Leonard Schwartz Wednesday 1230 12/25/2022 ROOM 7 Other: - Hydrofera blue with 4X4 GAUZE FOLDED INTO A SMALL SQUARE TO AID IN PUSHING THE HYDORFERA BLUE TO WOUND BED, bordered foam as new dressing change Monday, Wednesday, and Friday. Anesthetic: (In clinic) Topical Lidocaine 4% applied to wound bed Off-Loading: Low air-loss mattress (Group 2) - facility to ensure patient is using a group 2 air mattress. Turn and reposition every 2 hours Other: - continue using wheelchair cushion when in wheelchair. patient to minimize being up in wheelchair. 2 hours in the chair then 1 hour in the bed. Additional Orders / Instructions: Follow Nutritious Diet - increase protein to aid in wound healing. Juven Shake 1-2 times daily. Non Wound Condition: Other Non Wound Condition Orders/Instructions: - wrap both legs with unna boots change 3x a week. WOUND #1: - Sacrum Wound Laterality: Cleanser: Wound Cleanser 3 x Per Week/30 Days Discharge Instructions: Cleanse the wound with wound cleanser prior to applying a clean dressing using gauze sponges, not tissue or cotton balls. Cindy, Saunders ([id number]) [id number] Page 7 of 7 Peri-Wound Care: Skin Prep 3 x Per Week/30 Days Discharge Instructions: Use skin prep as directed Prim Dressing: Hydrofera Blue Ready Transfer Foam, 4x5 (in/in) 3 x Per Week/30 Days ary Discharge Instructions: Apply to wound bed. Secondary Dressing: Woven Gauze Sponge, Non-Sterile 4x4 in 3 x Per Week/30 Days Discharge Instructions: FOLD INTO A  FOUTH (SMALL SQUARE) TO ENSURE THE HYDROFERA BLUE IS TOUCHING DOWN INTO THE WOUND  BED. Secondary Dressing: Zetuvit Plus Silicone Border Dressing 4x4 (in/in) 3 x Per Week/30 Days Discharge Instructions: Apply silicone border over primary dressing as directed. 1. I would recommend based on what we are seeing at that we have the patient going continue with the [facility]. 2. I am going to use a gauze bolster behind the [facility] to help hold this in place. 3 will secure this with a bordered foam dressing. 4. I am also can recommend the patient should continue with appropriate offloading. I do believe their mattress is the best way to go but she needs to make sure that this is functioning properly. It sounds like it may not be quite doing so. We are going to see how things do over the next couple weeks we will make any adjustments following if anything changes she knows to let us know in the interim. We will see patient back for reevaluation in 2 weeks here in the clinic. If anything worsens or changes patient will contact our office for additional recommendations. Electronic Signature(s) Signed: 12/11/2022 1:34:38 PM By: Allen Derry PA-C Entered By: Allen Derry on 12/11/2022 13:34:38 -------------------------------------------------------------------------------- SuperBill Details Patient Name: Date of Service: Cindy, Saunders NA 12/11/2022 Medical Record Number: [id number] Patient Account Number: [id number] Date of Birth/Sex: Treating RN: 1945-08-05 (77 y.o. Arta Silence Primary Care Provider: Daphine Deutscher, Mary-Margaret Other Clinician: Referring Provider: Treating Provider/Extender: Lauretta Grill, Mary-Margaret Weeks in Treatment: 8 Diagnosis Coding ICD-10 Codes Code Description L89.154 Pressure ulcer of sacral region, stage 4 J44.9 Chronic obstructive pulmonary disease, unspecified I10 Essential (primary) hypertension N18.30 Chronic kidney disease, stage 3 unspecified I48.0  Paroxysmal atrial fibrillation F01.A0 Vascular dementia, mild, without behavioral disturbance, psychotic disturbance, mood disturbance, and anxiety Facility Procedures : CPT4 Code: 86578469 Description: 11042 - DEB SUBQ TISSUE 20 SQ CM/< ICD-10 Diagnosis Description L89.154 Pressure ulcer of sacral region, stage 4 Modifier: Quantity: 1 Physician Procedures : CPT4 Code Description Modifier [id number] 11042 - WC PHYS SUBQ TISS 20 SQ CM ICD-10 Diagnosis Description L89.154 Pressure ulcer of sacral region, stage 4 Quantity: 1 Electronic Signature(s) Signed: 12/11/2022 1:34:52 PM By: Allen Derry PA-C Entered By: Allen Derry on 12/11/2022 13:34:51

## 2022-12-12 DIAGNOSIS — M6281 Muscle weakness (generalized): Secondary | ICD-10-CM | POA: Diagnosis not present

## 2022-12-12 DIAGNOSIS — I82402 Acute embolism and thrombosis of unspecified deep veins of left lower extremity: Secondary | ICD-10-CM | POA: Diagnosis not present

## 2022-12-13 DIAGNOSIS — I82402 Acute embolism and thrombosis of unspecified deep veins of left lower extremity: Secondary | ICD-10-CM | POA: Diagnosis not present

## 2022-12-13 DIAGNOSIS — M6281 Muscle weakness (generalized): Secondary | ICD-10-CM | POA: Diagnosis not present

## 2022-12-13 NOTE — Progress Notes (Signed)
[name], [name] ([id number]) [id number] Page 1 of 6 Visit Report for 12/11/2022 Arrival Information Details Patient Name: Date of Service: [name], [name] NA 12/11/2022 12:45 PM Medical Record Number: [id number] Patient Account Number: [id number] Date of Birth/Sex: Treating RN: 07/05/1945 (77 y.o. F) Primary Care Juliannah Ohmann: Daphine Deutscher, Mary-Margaret Other Clinician: Referring Rawlin Reaume: Treating Dontray Haberland/Extender: Lauretta Grill, Mary-Margaret Weeks in Treatment: 8 Visit Information History Since Last Visit Added or deleted any medications: No Patient Arrived: Wheel Chair Any new allergies or adverse reactions: No Arrival Time: 13:00 Had a fall or experienced change in No Accompanied By: caregiver activities of daily living that may affect Transfer Assistance: None risk of falls: Patient Identification Verified: Yes Signs or symptoms of abuse/neglect since last visito No Secondary Verification Process Completed: Yes Hospitalized since last visit: No Patient Requires Transmission-Based Precautions: No Implantable device outside of the clinic excluding No Patient Has Alerts: No cellular tissue based products placed in the center since last visit: Pain Present Now: No Electronic Signature(s) Signed: 12/12/2022 4:50:33 PM By: Thayer Dallas Entered By: Thayer Dallas on 12/11/2022 13:01:07 -------------------------------------------------------------------------------- Encounter Discharge Information Details Patient Name: Date of Service: [name], [name] NA 12/11/2022 12:45 PM Medical Record Number: [id number] Patient Account Number: [id number] Date of Birth/Sex: Treating RN: 1945-10-16 (77 y.o. Arta Silence Primary Care Ursula Dermody: Daphine Deutscher, Mary-Margaret Other Clinician: Referring Neveen Daponte: Treating Lula Kolton/Extender: Lauretta Grill, Mary-Margaret Weeks in Treatment: 8 Encounter Discharge Information Items Post Procedure Vitals Discharge Condition:  Stable Temperature (F): 97.8 Ambulatory Status: Wheelchair Pulse (bpm): 64 Discharge Destination: Skilled Nursing Facility Respiratory Rate (breaths/min): 18 Telephoned: No Blood Pressure (mmHg): 143/77 Orders Sent: Yes Transportation: Private Auto Accompanied By: CAREGIVER Schedule Follow-up Appointment: Yes Clinical Summary of Care: Electronic Signature(s) Signed: 12/11/2022 5:02:36 PM By: Shawn Stall RN, BSN Entered By: Shawn Stall on 12/11/2022 13:29:02 [name] ([id number]) [id number] Page 2 of 6 -------------------------------------------------------------------------------- Lower Extremity Assessment Details Patient Name: Date of Service: Cindy, Saunders NA 12/11/2022 12:45 PM Medical Record Number: [id number] Patient Account Number: [id number] Date of Birth/Sex: Treating RN: 06-29-1945 (77 y.o. F) Primary Care Ananth Fiallos: Daphine Deutscher, Mary-Margaret Other Clinician: Referring Debbi Strandberg: Treating Herny Scurlock/Extender: Lauretta Grill, Mary-Margaret Weeks in Treatment: 8 Electronic Signature(s) Signed: 12/12/2022 4:50:33 PM By: Thayer Dallas Entered By: Thayer Dallas on 12/11/2022 13:03:05 -------------------------------------------------------------------------------- Multi-Disciplinary Care Plan Details Patient Name: Date of Service: [name], [name] NA 12/11/2022 12:45 PM Medical Record Number: [id number] Patient Account Number: [id number] Date of Birth/Sex: Treating RN: Mar 07, 1946 (77 y.o. [name], [name] Primary Care Jenaveve Fenstermaker: Daphine Deutscher, Mary-Margaret Other Clinician: Referring Harlea Goetzinger: Treating Keri Veale/Extender: Lauretta Grill, Mary-Margaret Weeks in Treatment: 8 Multidisciplinary Care Plan reviewed with physician Active Inactive Necrotic Tissue Nursing Diagnoses: Knowledge deficit related to management of necrotic/devitalized tissue Goals: Necrotic/devitalized tissue will be minimized in the wound bed Date Initiated:  10/16/2022 Target Resolution Date: 01/24/2023 Goal Status: Active Patient/caregiver will verbalize understanding of reason and process for debridement of necrotic tissue Date Initiated: 10/16/2022 Target Resolution Date: 01/24/2023 Goal Status: Active Interventions: Assess patient pain level pre-, during and post procedure and prior to discharge Provide education on necrotic tissue and debridement process Treatment Activities: Apply topical anesthetic as ordered : 10/16/2022 Enzymatic debridement : 10/16/2022 Notes: Pain, Acute or Chronic Nursing Diagnoses: Pain, acute or chronic: actual or potential Potential alteration in comfort, pain Goals: Patient will verbalize adequate pain control and receive pain control interventions during procedures as needed Date Initiated: 10/16/2022 Target Resolution Date: 01/24/2023 Goal Status: Active Patient/caregiver will verbalize comfort level met [name], [name] ([id number]) [id number] Page 3 of 6 Date  Initiated: 10/16/2022 Date Inactivated: 12/11/2022 Target Resolution Date: 12/27/2022 Goal Status: Met Interventions: Assess comfort goal upon admission Complete pain assessment as per visit requirements Provide education on pain management Treatment Activities: Administer pain control measures as ordered : 10/16/2022 Notes: Pressure Nursing Diagnoses: Knowledge deficit related to management of pressures ulcers Goals: Patient will remain free of pressure ulcers Date Initiated: 10/16/2022 Target Resolution Date: 01/10/2023 Goal Status: Active Interventions: Assess: immobility, friction, shearing, incontinence upon admission and as needed Assess offloading mechanisms upon admission and as needed Assess potential for pressure ulcer upon admission and as needed Provide education on pressure ulcers Notes: Electronic Signature(s) Signed: 12/11/2022 5:02:36 PM By: Shawn Stall RN, BSN Entered By: Shawn Stall on 12/11/2022  13:27:14 -------------------------------------------------------------------------------- Pain Assessment Details Patient Name: Date of Service: [name], [name] NA 12/11/2022 12:45 PM Medical Record Number: [id number] Patient Account Number: [id number] Date of Birth/Sex: Treating RN: Dec 09, 1945 (77 y.o. F) Primary Care Auriah Hollings: Daphine Deutscher, Mary-Margaret Other Clinician: Referring Raeana Blinn: Treating Seva Chancy/Extender: Lauretta Grill, Mary-Margaret Weeks in Treatment: 8 Active Problems Location of Pain Severity and Description of Pain Patient Has Paino No Site Locations Pain Management and Medication [name], [name] ([id number]) [id number] Page 4 of 6 Current Pain Management: Electronic Signature(s) Signed: 12/12/2022 4:50:33 PM By: Thayer Dallas Entered By: Thayer Dallas on 12/11/2022 13:02:56 -------------------------------------------------------------------------------- Patient/Caregiver Education Details Patient Name: Date of Service: [name], [name] NA 7/17/2024andnbsp12:45 PM Medical Record Number: [id number] Patient Account Number: [id number] Date of Birth/Gender: Treating RN: [birth year] (77 y.o. Arta Silence Primary Care Physician: Daphine Deutscher, Mary-Margaret Other Clinician: Referring Physician: Treating Physician/Extender: Lauretta Grill, Mary-Margaret Weeks in Treatment: 8 Education Assessment Education Provided To: Patient and Caregiver Education Topics Provided Wound/Skin Impairment: Handouts: Caring for Your Ulcer Methods: Explain/Verbal Responses: Reinforcements needed Electronic Signature(s) Signed: 12/11/2022 5:02:36 PM By: Shawn Stall RN, BSN Entered By: Shawn Stall on 12/11/2022 13:27:33 -------------------------------------------------------------------------------- Wound Assessment Details Patient Name: Date of Service: [name], [name] NA 12/11/2022 12:45 PM Medical Record Number: [id number] Patient Account Number:  [id number] Date of Birth/Sex: Treating RN: 1945/08/27 (77 y.o. F) Primary Care Kariann Wecker: Daphine Deutscher, Mary-Margaret Other Clinician: Referring Sherisse Fullilove: Treating Arick Mareno/Extender: Lauretta Grill, Mary-Margaret Weeks in Treatment: 8 Wound Status Wound Number: 1 Primary Pressure Ulcer Etiology: Wound Location: Sacrum Wound Open Wounding Event: Pressure Injury Status: Date Acquired: 08/26/2022 Comorbid Chronic Obstructive Pulmonary Disease (COPD), Congestive Weeks Of Treatment: 8 History: Heart Failure, Coronary Artery Disease, Deep Vein Thrombosis, Clustered Wound: No Hypertension, Peripheral Venous Disease, Osteoarthritis, Dementia Photos Holliday, [name] ([id number]) [id number] Page 5 of 6 Wound Measurements Length: (cm) 2.5 Width: (cm) 2 Depth: (cm) 1 Area: (cm) 3.927 Volume: (cm) 3.927 % Reduction in Area: 78.7% % Reduction in Volume: 87.5% Epithelialization: None Tunneling: No Undermining: No Wound Description Classification: Category/Stage IV Wound Margin: Thickened Exudate Amount: Medium Exudate Type: Serosanguineous Exudate Color: red, brown Foul Odor After Cleansing: No Slough/Fibrino Yes Wound Bed Granulation Amount: None Present (0%) Exposed Structure Necrotic Amount: Large (67-100%) Fascia Exposed: No Necrotic Quality: Adherent Slough Fat Layer (Subcutaneous Tissue) Exposed: Yes Tendon Exposed: No Muscle Exposed: Yes Necrosis of Muscle: No Joint Exposed: No Bone Exposed: No Periwound Skin Texture Texture Color No Abnormalities Noted: No No Abnormalities Noted: No Callus: No Atrophie Blanche: No Crepitus: No Cyanosis: No Excoriation: No Ecchymosis: No Induration: No Erythema: No Rash: No Hemosiderin Staining: No Scarring: No Mottled: No Pallor: No Moisture Rubor: No No Abnormalities Noted: No Dry / Scaly: No Maceration: No Treatment Notes Wound #1 (Sacrum) Cleanser Wound Cleanser Discharge Instruction:  Cleanse the wound with wound  cleanser prior to applying a clean dressing using gauze sponges, not tissue or cotton balls. Peri-Wound Care Skin Prep Discharge Instruction: Use skin prep as directed Topical Primary Dressing Hydrofera Blue Ready Transfer Foam, 4x5 (in/in) Discharge Instruction: Apply to wound bed. Secondary Dressing Woven Gauze Sponge, Non-Sterile 4x4 in Discharge Instruction: FOLD INTO A FOUTH (SMALL SQUARE) TO ENSURE THE HYDROFERA BLUE IS TOUCHING DOWN INTO THE WOUND BED. [name], [name] ([id number]) [id number] Page 6 of 6 Zetuvit Plus Silicone Border Dressing 4x4 (in/in) Discharge Instruction: Apply silicone border over primary dressing as directed. Secured With Compression Wrap Compression Stockings Facilities manager) Signed: 12/12/2022 4:50:33 PM By: Thayer Dallas Entered By: Thayer Dallas on 12/11/2022 13:15:25 -------------------------------------------------------------------------------- Vitals Details Patient Name: Date of Service: [name], [name] NA 12/11/2022 12:45 PM Medical Record Number: [id number] Patient Account Number: [id number] Date of Birth/Sex: Treating RN: [birth year] (77 y.o. F) Primary Care Amal Renbarger: Daphine Deutscher, Mary-Margaret Other Clinician: Referring Maurion Walkowiak: Treating Liandro Thelin/Extender: Lauretta Grill, Mary-Margaret Weeks in Treatment: 8 Vital Signs Time Taken: 13:01 Temperature (F): 97.8 Height (in): 62 Pulse (bpm): 64 Weight (lbs): 139 Respiratory Rate (breaths/min): 18 Body Mass Index (BMI): 25.4 Blood Pressure (mmHg): 143/77 Reference Range: 80 - 120 mg / dl Electronic Signature(s) Signed: 12/12/2022 4:50:33 PM By: Thayer Dallas Entered By: Thayer Dallas on 12/11/2022 13:02:50

## 2022-12-16 DIAGNOSIS — I82402 Acute embolism and thrombosis of unspecified deep veins of left lower extremity: Secondary | ICD-10-CM | POA: Diagnosis not present

## 2022-12-16 DIAGNOSIS — M170 Bilateral primary osteoarthritis of knee: Secondary | ICD-10-CM | POA: Diagnosis not present

## 2022-12-16 DIAGNOSIS — Z79899 Other long term (current) drug therapy: Secondary | ICD-10-CM | POA: Diagnosis not present

## 2022-12-16 DIAGNOSIS — M6281 Muscle weakness (generalized): Secondary | ICD-10-CM | POA: Diagnosis not present

## 2022-12-16 DIAGNOSIS — R52 Pain, unspecified: Secondary | ICD-10-CM | POA: Diagnosis not present

## 2022-12-17 DIAGNOSIS — Z79899 Other long term (current) drug therapy: Secondary | ICD-10-CM | POA: Diagnosis not present

## 2022-12-17 DIAGNOSIS — I82402 Acute embolism and thrombosis of unspecified deep veins of left lower extremity: Secondary | ICD-10-CM | POA: Diagnosis not present

## 2022-12-17 DIAGNOSIS — R52 Pain, unspecified: Secondary | ICD-10-CM | POA: Diagnosis not present

## 2022-12-17 DIAGNOSIS — M170 Bilateral primary osteoarthritis of knee: Secondary | ICD-10-CM | POA: Diagnosis not present

## 2022-12-17 DIAGNOSIS — M6281 Muscle weakness (generalized): Secondary | ICD-10-CM | POA: Diagnosis not present

## 2022-12-18 DIAGNOSIS — M170 Bilateral primary osteoarthritis of knee: Secondary | ICD-10-CM | POA: Diagnosis not present

## 2022-12-18 DIAGNOSIS — K219 Gastro-esophageal reflux disease without esophagitis: Secondary | ICD-10-CM | POA: Diagnosis not present

## 2022-12-18 DIAGNOSIS — F411 Generalized anxiety disorder: Secondary | ICD-10-CM | POA: Diagnosis not present

## 2022-12-18 DIAGNOSIS — D649 Anemia, unspecified: Secondary | ICD-10-CM | POA: Diagnosis not present

## 2022-12-18 DIAGNOSIS — E559 Vitamin D deficiency, unspecified: Secondary | ICD-10-CM | POA: Diagnosis not present

## 2022-12-18 DIAGNOSIS — S31000D Unspecified open wound of lower back and pelvis without penetration into retroperitoneum, subsequent encounter: Secondary | ICD-10-CM | POA: Diagnosis not present

## 2022-12-18 DIAGNOSIS — E039 Hypothyroidism, unspecified: Secondary | ICD-10-CM | POA: Diagnosis not present

## 2022-12-18 DIAGNOSIS — I82402 Acute embolism and thrombosis of unspecified deep veins of left lower extremity: Secondary | ICD-10-CM | POA: Diagnosis not present

## 2022-12-18 DIAGNOSIS — E8809 Other disorders of plasma-protein metabolism, not elsewhere classified: Secondary | ICD-10-CM | POA: Diagnosis not present

## 2022-12-18 DIAGNOSIS — I890 Lymphedema, not elsewhere classified: Secondary | ICD-10-CM | POA: Diagnosis not present

## 2022-12-18 DIAGNOSIS — M6281 Muscle weakness (generalized): Secondary | ICD-10-CM | POA: Diagnosis not present

## 2022-12-18 DIAGNOSIS — I700 Atherosclerosis of aorta: Secondary | ICD-10-CM | POA: Diagnosis not present

## 2022-12-18 DIAGNOSIS — N1832 Chronic kidney disease, stage 3b: Secondary | ICD-10-CM | POA: Diagnosis not present

## 2022-12-19 DIAGNOSIS — M6281 Muscle weakness (generalized): Secondary | ICD-10-CM | POA: Diagnosis not present

## 2022-12-19 DIAGNOSIS — I82402 Acute embolism and thrombosis of unspecified deep veins of left lower extremity: Secondary | ICD-10-CM | POA: Diagnosis not present

## 2022-12-20 DIAGNOSIS — I82402 Acute embolism and thrombosis of unspecified deep veins of left lower extremity: Secondary | ICD-10-CM | POA: Diagnosis not present

## 2022-12-20 DIAGNOSIS — M6281 Muscle weakness (generalized): Secondary | ICD-10-CM | POA: Diagnosis not present

## 2022-12-20 DIAGNOSIS — M170 Bilateral primary osteoarthritis of knee: Secondary | ICD-10-CM | POA: Diagnosis not present

## 2022-12-23 DIAGNOSIS — I82402 Acute embolism and thrombosis of unspecified deep veins of left lower extremity: Secondary | ICD-10-CM | POA: Diagnosis not present

## 2022-12-23 DIAGNOSIS — M6281 Muscle weakness (generalized): Secondary | ICD-10-CM | POA: Diagnosis not present

## 2022-12-24 DIAGNOSIS — M6281 Muscle weakness (generalized): Secondary | ICD-10-CM | POA: Diagnosis not present

## 2022-12-24 DIAGNOSIS — I82402 Acute embolism and thrombosis of unspecified deep veins of left lower extremity: Secondary | ICD-10-CM | POA: Diagnosis not present

## 2022-12-25 ENCOUNTER — Encounter (HOSPITAL_BASED_OUTPATIENT_CLINIC_OR_DEPARTMENT_OTHER): Payer: Medicare Other | Admitting: Physician Assistant

## 2022-12-25 DIAGNOSIS — L03115 Cellulitis of right lower limb: Secondary | ICD-10-CM | POA: Diagnosis not present

## 2022-12-25 DIAGNOSIS — R509 Fever, unspecified: Secondary | ICD-10-CM | POA: Diagnosis not present

## 2022-12-25 DIAGNOSIS — L89154 Pressure ulcer of sacral region, stage 4: Secondary | ICD-10-CM | POA: Diagnosis not present

## 2022-12-25 DIAGNOSIS — Z86718 Personal history of other venous thrombosis and embolism: Secondary | ICD-10-CM | POA: Diagnosis not present

## 2022-12-25 DIAGNOSIS — E538 Deficiency of other specified B group vitamins: Secondary | ICD-10-CM | POA: Diagnosis present

## 2022-12-25 DIAGNOSIS — Z833 Family history of diabetes mellitus: Secondary | ICD-10-CM | POA: Diagnosis not present

## 2022-12-25 DIAGNOSIS — I5033 Acute on chronic diastolic (congestive) heart failure: Secondary | ICD-10-CM | POA: Diagnosis present

## 2022-12-25 DIAGNOSIS — J4489 Other specified chronic obstructive pulmonary disease: Secondary | ICD-10-CM | POA: Diagnosis present

## 2022-12-25 DIAGNOSIS — K219 Gastro-esophageal reflux disease without esophagitis: Secondary | ICD-10-CM | POA: Diagnosis present

## 2022-12-25 DIAGNOSIS — I10 Essential (primary) hypertension: Secondary | ICD-10-CM | POA: Diagnosis not present

## 2022-12-25 DIAGNOSIS — Z981 Arthrodesis status: Secondary | ICD-10-CM | POA: Diagnosis not present

## 2022-12-25 DIAGNOSIS — R0602 Shortness of breath: Secondary | ICD-10-CM | POA: Diagnosis not present

## 2022-12-25 DIAGNOSIS — E785 Hyperlipidemia, unspecified: Secondary | ICD-10-CM | POA: Diagnosis present

## 2022-12-25 DIAGNOSIS — I5032 Chronic diastolic (congestive) heart failure: Secondary | ICD-10-CM | POA: Diagnosis not present

## 2022-12-25 DIAGNOSIS — L03311 Cellulitis of abdominal wall: Secondary | ICD-10-CM | POA: Diagnosis present

## 2022-12-25 DIAGNOSIS — M6281 Muscle weakness (generalized): Secondary | ICD-10-CM | POA: Diagnosis not present

## 2022-12-25 DIAGNOSIS — Z7901 Long term (current) use of anticoagulants: Secondary | ICD-10-CM | POA: Diagnosis not present

## 2022-12-25 DIAGNOSIS — N184 Chronic kidney disease, stage 4 (severe): Secondary | ICD-10-CM | POA: Diagnosis present

## 2022-12-25 DIAGNOSIS — L89153 Pressure ulcer of sacral region, stage 3: Secondary | ICD-10-CM | POA: Diagnosis present

## 2022-12-25 DIAGNOSIS — E039 Hypothyroidism, unspecified: Secondary | ICD-10-CM | POA: Diagnosis present

## 2022-12-25 DIAGNOSIS — Z90711 Acquired absence of uterus with remaining cervical stump: Secondary | ICD-10-CM | POA: Diagnosis not present

## 2022-12-25 DIAGNOSIS — F0394 Unspecified dementia, unspecified severity, with anxiety: Secondary | ICD-10-CM | POA: Diagnosis present

## 2022-12-25 DIAGNOSIS — R0989 Other specified symptoms and signs involving the circulatory and respiratory systems: Secondary | ICD-10-CM | POA: Diagnosis not present

## 2022-12-25 DIAGNOSIS — R531 Weakness: Secondary | ICD-10-CM | POA: Diagnosis not present

## 2022-12-25 DIAGNOSIS — M810 Age-related osteoporosis without current pathological fracture: Secondary | ICD-10-CM | POA: Diagnosis present

## 2022-12-25 DIAGNOSIS — J811 Chronic pulmonary edema: Secondary | ICD-10-CM | POA: Diagnosis not present

## 2022-12-25 DIAGNOSIS — J9601 Acute respiratory failure with hypoxia: Secondary | ICD-10-CM | POA: Diagnosis not present

## 2022-12-25 DIAGNOSIS — I517 Cardiomegaly: Secondary | ICD-10-CM | POA: Diagnosis not present

## 2022-12-25 DIAGNOSIS — I130 Hypertensive heart and chronic kidney disease with heart failure and stage 1 through stage 4 chronic kidney disease, or unspecified chronic kidney disease: Secondary | ICD-10-CM | POA: Diagnosis present

## 2022-12-25 DIAGNOSIS — E669 Obesity, unspecified: Secondary | ICD-10-CM | POA: Diagnosis present

## 2022-12-25 DIAGNOSIS — A419 Sepsis, unspecified organism: Secondary | ICD-10-CM | POA: Diagnosis not present

## 2022-12-25 DIAGNOSIS — F0393 Unspecified dementia, unspecified severity, with mood disturbance: Secondary | ICD-10-CM | POA: Diagnosis present

## 2022-12-25 DIAGNOSIS — E559 Vitamin D deficiency, unspecified: Secondary | ICD-10-CM | POA: Diagnosis present

## 2022-12-25 DIAGNOSIS — D649 Anemia, unspecified: Secondary | ICD-10-CM | POA: Diagnosis not present

## 2022-12-25 DIAGNOSIS — Z1152 Encounter for screening for COVID-19: Secondary | ICD-10-CM | POA: Diagnosis not present

## 2022-12-25 DIAGNOSIS — M7989 Other specified soft tissue disorders: Secondary | ICD-10-CM | POA: Diagnosis not present

## 2022-12-25 DIAGNOSIS — I82402 Acute embolism and thrombosis of unspecified deep veins of left lower extremity: Secondary | ICD-10-CM | POA: Diagnosis not present

## 2022-12-25 DIAGNOSIS — R600 Localized edema: Secondary | ICD-10-CM | POA: Diagnosis not present

## 2022-12-25 DIAGNOSIS — I824Y2 Acute embolism and thrombosis of unspecified deep veins of left proximal lower extremity: Secondary | ICD-10-CM | POA: Diagnosis not present

## 2022-12-25 DIAGNOSIS — F32A Depression, unspecified: Secondary | ICD-10-CM | POA: Diagnosis present

## 2022-12-25 DIAGNOSIS — I959 Hypotension, unspecified: Secondary | ICD-10-CM | POA: Diagnosis not present

## 2022-12-25 DIAGNOSIS — D631 Anemia in chronic kidney disease: Secondary | ICD-10-CM | POA: Diagnosis present

## 2022-12-25 NOTE — Progress Notes (Signed)
[name], [name] ([id number]) [id number] Page 1 of 8 Visit Report for 12/25/2022 Chief Complaint Document Details Patient Name: Date of Service: [name], [name] NA 12/25/2022 12:30 PM Medical Record Number: [id number] Patient Account Number: [id number] Date of Birth/Sex: Treating RN: 1945/07/01 (77 y.o. F) Primary Care Provider: Daphine Deutscher, Mary-Margaret Other Clinician: Referring Provider: Treating Provider/Extender: Lauretta Grill, Mary-Margaret Weeks in Treatment: 10 Information Obtained from: Patient Chief Complaint Sacral pressure ulcer Electronic Signature(s) Signed: 12/25/2022 12:46:00 PM By: Allen Derry PA-C Entered By: Allen Derry on 12/25/2022 12:45:59 -------------------------------------------------------------------------------- Debridement Details Patient Name: Date of Service: [name], [name] NA 12/25/2022 12:30 PM Medical Record Number: [id number] Patient Account Number: [id number] Date of Birth/Sex: Treating RN: 1946-02-16 (77 y.o. [name], [name] Primary Care Provider: Daphine Deutscher, Mary-Margaret Other Clinician: Referring Provider: Treating Provider/Extender: Lauretta Grill, Mary-Margaret Weeks in Treatment: 10 Debridement Performed for Assessment: Wound #1 Sacrum Performed By: Physician Lenda Kelp, PA Debridement Type: Debridement Level of Consciousness (Pre-procedure): Awake and Alert Pre-procedure Verification/Time Out Yes - 13:05 Taken: Start Time: 13:06 Pain Control: Lidocaine 4% T opical Solution Percent of Wound Bed Debrided: 100% T Area Debrided (cm): otal 1.57 Tissue and other material debrided: Viable, Non-Viable, Slough, Subcutaneous, Skin: Dermis , Skin: Epidermis, Slough Level: Skin/Subcutaneous Tissue Debridement Description: Excisional Instrument: Curette Bleeding: Minimum Hemostasis Achieved: Pressure End Time: 13:10 Procedural Pain: 0 Post Procedural Pain: 0 Response to Treatment: Procedure was tolerated  well Level of Consciousness (Post- Awake and Alert procedure): Post Debridement Measurements of Total Wound Length: (cm) 2 Stage: Category/Stage IV Width: (cm) 1 Depth: (cm) 1 Volume: (cm) 1.571 Character of Wound/Ulcer Post Debridement: Improved Veronia Beets ([id number]) [id number] Page 2 of 8 Post Procedure Diagnosis Same as Pre-procedure Electronic Signature(s) Unsigned Entered By: Shawn Stall on 12/25/2022 13:31:43 -------------------------------------------------------------------------------- HPI Details Patient Name: Date of Service: [name], [name] NA 12/25/2022 12:30 PM Medical Record Number: [id number] Patient Account Number: [id number] Date of Birth/Sex: Treating RN: [birth year] (77 y.o. F) Primary Care Provider: Daphine Deutscher, Mary-Margaret Other Clinician: Referring Provider: Treating Provider/Extender: Lauretta Grill, Mary-Margaret Weeks in Treatment: 10 History of Present Illness HPI Description: 10-16-2022 upon evaluation today patient appears to be doing somewhat poorly in regard to a wound in the sacral area/lower back region. Fortunately there does not appear to be any signs of infection at this point which is great news. With that being said she unfortunately did develop this wound as a result of having had a hospital stay which dates back to looks like at least the beginning of April where there was unstageable wound noted. It was between May 2 and 7 that she fell and broke her shoulder and was put in a sling that she had C. difficile infection and subsequently this is when the wound was mostly described. Fortunately there does not appear to be any signs of infection locally or systemically which is great news but at the same time she does have a significantly deep wound that is of concern at this point. That is the reason she comes in for evaluation to see if there is anything we can offer or recommend to get this healed faster. Patient  does have a history of COPD, hypertension, chronic kidney disease stage III, atrial fibrillation, and vascular dementia although there is no behavioral disturbance that I see or perceived that she seems to be answering questions appropriately and asking good questions to be honest. 10-30-2022 upon evaluation today patient appears to be doing well currently in regard to her wound which appears to be healthy at  this point. I do not see any signs of active infection locally nor systemically which is great news. No fevers, chills, nausea, vomiting, or diarrhea. With that being said I am actually not seeing much improvement compared to last time I saw her simply due to the fact that she has not had the wound VAC on she was seen on the 22nd and then subsequently ended up being admitted to the hospital on the 25th due to a blood clot in her leg. This ended with therapy in the hospital through what appears to be the third so she is going to back to the facility for 2 days before coming in today and then subsequently the wound VAC was not used upon readmission. We did have a letter from the treatment nurse that stated the patient transfers independently multiple times throughout the day. Obviously based on the weakness and what I am seeing it took to individuals my nurse and the individual with her from the facility to move her into the bed today safely. I do not think she needs to be transferring independently and I think she may need a sitter in fact we might even need to do this long-term while she is on the wound VAC in order to ensure that she does not try to get up on her own and in the process hurt herself. Fortunately I do not see any signs of active infection locally nor systemically which is great news. I do still think a wound VAC would be the primary best way to get this closed as quickly as possible. 11-13-2022 upon evaluation patient appears to be doing better in regard to her wounds from a size  perspective although are still having a lot of issues here with the facility putting on the wound VAC appropriately. They continue to not really do this appropriately which has been the primary concern. In fact because of that it is not really functioning properly and continues to not do as well as we really think it should be. Nonetheless we will going to see about having [facility] go out and do some training with regard to placement of wound vacs. 11-20-2022 upon evaluation today patient appears to be doing well currently in regard to her wound. She is actually showing signs of improvement I am actually very pleased with where things stand I do believe that wound VAC is doing a good job here. Fortunately I do not see any evidence of active infection locally or systemically which is great news. No fevers, chills, nausea, vomiting, or diarrhea. 11-27-2022 upon evaluation patient's wound is doing really about the same. I do not think it is any worse also do not think it significantly better at this point. I do think that she would benefit from a continuation of therapy with regard to the wound VAC but unfortunately this just does not seem to be staying in place which is unfortunate. 12-11-2022 upon evaluation patient's wound unfortunately does not appear to be doing quite as well today as I was hoping for. It does appear that she has some evidence of breakdown as far as the dressing not staying quite in place. I think that she needs to have this more appropriately secured with some bolster behind to help hold the dressing in place. The patient voiced understanding. Subsequently I do believe that we are making good headway towards complete closure which is good news but at the same time we need to make sure that the dressing is maintaining contact with  the base of the wound otherwise when I can see the improvement we want. 12-25-2022 upon evaluation today patient appears to be doing well currently in regard to her  wound. She has been tolerating the dressing changes without complication. Fortunately there does not appear to be any signs of active infection at this time. No fevers, chills, nausea, vomiting, or diarrhea. Electronic Signature(s) Signed: 12/25/2022 1:15:18 PM By: Allen Derry PA-C Entered By: Allen Derry on 12/25/2022 13:15:17 Mor[place], [name] ([id number]) [id number] Page 3 of 8 -------------------------------------------------------------------------------- Physical Exam Details Patient Name: Date of Service: [name], [name] NA 12/25/2022 12:30 PM Medical Record Number: [id number] Patient Account Number: [id number] Date of Birth/Sex: Treating RN: [birth year] (77 y.o. F) Primary Care Provider: Daphine Deutscher, Mary-Margaret Other Clinician: Referring Provider: Treating Provider/Extender: Lauretta Grill, Mary-Margaret Weeks in Treatment: 10 Constitutional Well-nourished and well-hydrated in no acute distress. Respiratory normal breathing without difficulty. Psychiatric this patient is able to make decisions and demonstrates good insight into disease process. Alert and Oriented x 3. pleasant and cooperative. Notes Upon inspection patient's wound bed showed signs of good granulation and epithelization at this point. Fortunately I see no evidence of active infection which is great news and in general I think that we are making excellent headway towards complete closure. In fact this is the best I am seeing her wound the entire time I been helping take care of her. Electronic Signature(s) Signed: 12/25/2022 1:15:42 PM By: Allen Derry PA-C Entered By: Allen Derry on 12/25/2022 13:15:42 -------------------------------------------------------------------------------- Physician Orders Details Patient Name: Date of Service: [name], [name] NA 12/25/2022 12:30 PM Medical Record Number: [id number] Patient Account Number: [id number] Date of Birth/Sex: Treating RN: Oct 06, 1945 (77 y.o. Arta Silence Primary Care Provider: Daphine Deutscher, Mary-Margaret Other Clinician: Referring Provider: Treating Provider/Extender: Lauretta Grill, Mary-Margaret Weeks in Treatment: 10 Verbal / Phone Orders: No Diagnosis Coding ICD-10 Coding Code Description L89.154 Pressure ulcer of sacral region, stage 4 J44.9 Chronic obstructive pulmonary disease, unspecified I10 Essential (primary) hypertension N18.30 Chronic kidney disease, stage 3 unspecified I48.0 Paroxysmal atrial fibrillation F01.A0 Vascular dementia, mild, without behavioral disturbance, psychotic disturbance, mood disturbance, and anxiety Follow-up Appointments ppointment in 2 weeks. Leonard Schwartz Wednesday 1230 01/08/2023 ROOM 7 Return A Other: - Hydrofera blue with 4X4 GAUZE FOLDED INTO A SMALL SQUARE TO AID IN PUSHING THE HYDORFERA BLUE TO WOUND BED, bordered foam as new dressing change Monday, Wednesday, and Friday. Anesthetic (In clinic) Topical Lidocaine 4% applied to wound bed Off-Loading Low air-loss mattress (Group 2) - facility to ensure patient is using a group 2 air mattress. Turn and reposition every 2 hours [place], [name] ([id number]) [id number] Page 4 of 8 Other: - continue using wheelchair cushion when in wheelchair. patient to minimize being up in wheelchair. 2 hours in the chair then 1 hour in the bed. Additional Orders / Instructions Follow Nutritious Diet - increase protein to aid in wound healing. Juven Shake 1-2 times daily. Non Wound Condition Bilateral Lower Extremities Other Non Wound Condition Orders/Instructions: - wrap both legs with unna boots change 3x a week. Wound Treatment Wound #1 - Sacrum Cleanser: Wound Cleanser 3 x Per Week/30 Days Discharge Instructions: Cleanse the wound with wound cleanser prior to applying a clean dressing using gauze sponges, not tissue or cotton balls. Peri-Wound Care: Skin Prep 3 x Per Week/30 Days Discharge Instructions: Use skin prep as  directed Prim Dressing: Hydrofera Blue Ready Transfer Foam, 4x5 (in/in) ary 3 x Per Week/30 Days Discharge Instructions: Apply to wound bed. Secondary Dressing: Woven Gauze Sponge,  Non-Sterile 4x4 in 3 x Per Week/30 Days Discharge Instructions: FOLD INTO A FOURTH (SMALL SQUARE) TO ENSURE THE HYDROFERA BLUE IS TOUCHING DOWN INTO THE WOUND BED. Secondary Dressing: Zetuvit Plus Silicone Border Dressing 4x4 (in/in) 3 x Per Week/30 Days Discharge Instructions: Apply silicone border over primary dressing as directed. Electronic Signature(s) Unsigned Entered By: Shawn Stall on 12/25/2022 13:32:59 -------------------------------------------------------------------------------- Problem List Details Patient Name: Date of Service: [name], [name] NA 12/25/2022 12:30 PM Medical Record Number: [id number] Patient Account Number: [id number] Date of Birth/Sex: Treating RN: 11-17-1945 (77 y.o. F) Primary Care Provider: Daphine Deutscher, Mary-Margaret Other Clinician: Referring Provider: Treating Provider/Extender: Lauretta Grill, Mary-Margaret Weeks in Treatment: 10 Active Problems ICD-10 Encounter Code Description Active Date MDM Diagnosis L89.154 Pressure ulcer of sacral region, stage 4 10/16/2022 No Yes J44.9 Chronic obstructive pulmonary disease, unspecified 10/16/2022 No Yes I10 Essential (primary) hypertension 10/16/2022 No Yes N18.30 Chronic kidney disease, stage 3 unspecified 10/16/2022 No Yes I48.0 Paroxysmal atrial fibrillation 10/16/2022 No Yes [place], [name] ([id number]) [phone].pdf Page 5 of 8 F01.A0 Vascular dementia, mild, without behavioral disturbance, psychotic 10/16/2022 No Yes disturbance, mood disturbance, and anxiety Inactive Problems Resolved Problems Electronic Signature(s) Signed: 12/25/2022 12:45:48 PM By: Allen Derry PA-C Entered By: Allen Derry on 12/25/2022  12:45:48 -------------------------------------------------------------------------------- Progress Note Details Patient Name: Date of Service: [name], [name] NA 12/25/2022 12:30 PM Medical Record Number: [id number] Patient Account Number: [id number] Date of Birth/Sex: Treating RN: 08/21/1945 (77 y.o. F) Primary Care Provider: Daphine Deutscher, Mary-Margaret Other Clinician: Referring Provider: Treating Provider/Extender: Lauretta Grill, Mary-Margaret Weeks in Treatment: 10 Subjective Chief Complaint Information obtained from Patient Sacral pressure ulcer History of Present Illness (HPI) 10-16-2022 upon evaluation today patient appears to be doing somewhat poorly in regard to a wound in the sacral area/lower back region. Fortunately there does not appear to be any signs of infection at this point which is great news. With that being said she unfortunately did develop this wound as a result of having had a hospital stay which dates back to looks like at least the beginning of April where there was unstageable wound noted. It was between May 2 and 7 that she fell and broke her shoulder and was put in a sling that she had C. difficile infection and subsequently this is when the wound was mostly described. Fortunately there does not appear to be any signs of infection locally or systemically which is great news but at the same time she does have a significantly deep wound that is of concern at this point. That is the reason she comes in for evaluation to see if there is anything we can offer or recommend to get this healed faster. Patient does have a history of COPD, hypertension, chronic kidney disease stage III, atrial fibrillation, and vascular dementia although there is no behavioral disturbance that I see or perceived that she seems to be answering questions appropriately and asking good questions to be honest. 10-30-2022 upon evaluation today patient appears to be doing well currently in regard to  her wound which appears to be healthy at this point. I do not see any signs of active infection locally nor systemically which is great news. No fevers, chills, nausea, vomiting, or diarrhea. With that being said I am actually not seeing much improvement compared to last time I saw her simply due to the fact that she has not had the wound VAC on she was seen on the 22nd and then subsequently ended up being admitted to the hospital on the 25th due  to a blood clot in her leg. This ended with therapy in the hospital through what appears to be the third so she is going to back to the facility for 2 days before coming in today and then subsequently the wound VAC was not used upon readmission. We did have a letter from the treatment nurse that stated the patient transfers independently multiple times throughout the day. Obviously based on the weakness and what I am seeing it took to individuals my nurse and the individual with her from the facility to move her into the bed today safely. I do not think she needs to be transferring independently and I think she may need a sitter in fact we might even need to do this long-term while she is on the wound VAC in order to ensure that she does not try to get up on her own and in the process hurt herself. Fortunately I do not see any signs of active infection locally nor systemically which is great news. I do still think a wound VAC would be the primary best way to get this closed as quickly as possible. 11-13-2022 upon evaluation patient appears to be doing better in regard to her wounds from a size perspective although are still having a lot of issues here with the facility putting on the wound VAC appropriately. They continue to not really do this appropriately which has been the primary concern. In fact because of that it is not really functioning properly and continues to not do as well as we really think it should be. Nonetheless we will going to see about having  [facility] go out and do some training with regard to placement of wound vacs. 11-20-2022 upon evaluation today patient appears to be doing well currently in regard to her wound. She is actually showing signs of improvement I am actually very pleased with where things stand I do believe that wound VAC is doing a good job here. Fortunately I do not see any evidence of active infection locally or systemically which is great news. No fevers, chills, nausea, vomiting, or diarrhea. 11-27-2022 upon evaluation patient's wound is doing really about the same. I do not think it is any worse also do not think it significantly better at this point. I do think that she would benefit from a continuation of therapy with regard to the wound VAC but unfortunately this just does not seem to be staying in place which is unfortunate. 12-11-2022 upon evaluation patient's wound unfortunately does not appear to be doing quite as well today as I was hoping for. It does appear that she has some evidence of breakdown as far as the dressing not staying quite in place. I think that she needs to have this more appropriately secured with some bolster behind to help hold the dressing in place. The patient voiced understanding. Subsequently I do believe that we are making good headway towards complete closure which is good news but at the same time we need to make sure that the dressing is maintaining contact with the base of the wound otherwise when I can see the improvement we want. 12-25-2022 upon evaluation today patient appears to be doing well currently in regard to her wound. She has been tolerating the dressing changes without [name], [name] ([id number]) [id number] Page 6 of 8 complication. Fortunately there does not appear to be any signs of active infection at this time. No fevers, chills, nausea, vomiting, or diarrhea. Objective Constitutional Well-nourished and well-hydrated in no acute  distress. Vitals Time Taken: 12:41 PM, Height: 62 in, Weight: 139 lbs, BMI: 25.4, Temperature: 98.6 F, Pulse: 54 bpm, Respiratory Rate: 18 breaths/min, Blood Pressure: 160/80 mmHg. Respiratory normal breathing without difficulty. Psychiatric this patient is able to make decisions and demonstrates good insight into disease process. Alert and Oriented x 3. pleasant and cooperative. General Notes: Upon inspection patient's wound bed showed signs of good granulation and epithelization at this point. Fortunately I see no evidence of active infection which is great news and in general I think that we are making excellent headway towards complete closure. In fact this is the best I am seeing her wound the entire time I been helping take care of her. Integumentary (Hair, Skin) Wound #1 status is Open. Original cause of wound was Pressure Injury. The date acquired was: 08/26/2022. The wound has been in treatment 10 weeks. The wound is located on the Sacrum. The wound measures 2cm length x 1cm width x 1cm depth; 1.571cm^2 area and 1.571cm^3 volume. There is muscle and Fat Layer (Subcutaneous Tissue) exposed. There is no tunneling or undermining noted. There is a medium amount of serosanguineous drainage noted. The wound margin is thickened. There is no granulation within the wound bed. There is a large (67-100%) amount of necrotic tissue within the wound bed including Adherent Slough. The periwound skin appearance exhibited: Maceration. The periwound skin appearance did not exhibit: Callus, Crepitus, Excoriation, Induration, Rash, Scarring, Dry/Scaly, Atrophie Blanche, Cyanosis, Ecchymosis, Hemosiderin Staining, Mottled, Pallor, Rubor, Erythema. Assessment Active Problems ICD-10 Pressure ulcer of sacral region, stage 4 Chronic obstructive pulmonary disease, unspecified Essential (primary) hypertension Chronic kidney disease, stage 3 unspecified Paroxysmal atrial fibrillation Vascular dementia, mild,  without behavioral disturbance, psychotic disturbance, mood disturbance, and anxiety Procedures Wound #1 Pre-procedure diagnosis of Wound #1 is a Pressure Ulcer located on the Sacrum . There was a Excisional Skin/Subcutaneous Tissue Debridement with a total area of 1.57 sq cm performed by Lenda Kelp, PA. With the following instrument(s): Curette to remove Viable and Non-Viable tissue/material. Material removed includes Subcutaneous Tissue, Slough, Skin: Dermis, and Skin: Epidermis after achieving pain control using Lidocaine 4% T opical Solution. A time out was conducted at 13:05, prior to the start of the procedure. A Minimum amount of bleeding was controlled with Pressure. The procedure was tolerated well with a pain level of 0 throughout and a pain level of 0 following the procedure. Post Debridement Measurements: 2cm length x 1cm width x 1cm depth; 1.571cm^3 volume. Post debridement Stage noted as Category/Stage IV. Character of Wound/Ulcer Post Debridement is improved. Post procedure Diagnosis Wound #1: Same as Pre-Procedure Plan Follow-up Appointments: Return Appointment in 2 weeks. Leonard Schwartz Wednesday 1230 01/08/2023 ROOM 7 Other: - Hydrofera blue with 4X4 GAUZE FOLDED INTO A SMALL SQUARE TO AID IN PUSHING THE HYDORFERA BLUE TO WOUND BED, bordered foam as new dressing change Monday, Wednesday, and Friday. Anesthetic: (In clinic) Topical Lidocaine 4% applied to wound bed Off-Loading: Low air-loss mattress (Group 2) - facility to ensure patient is using a group 2 air mattress. Turn and reposition every 2 hours Other: - continue using wheelchair cushion when in wheelchair. patient to minimize being up in wheelchair. 2 hours in the chair then 1 hour in the bed. Additional Orders / InstructionsLANNA, [name] ([id number]) [id number] Page 7 of 8 Follow Nutritious Diet - increase protein to aid in wound healing. Juven Shake 1-2 times daily. Non Wound  Condition: Other Non Wound Condition Orders/Instructions: - wrap both legs with unna boots change 3x a  week. WOUND #1: - Sacrum Wound Laterality: Cleanser: Wound Cleanser 3 x Per Week/30 Days Discharge Instructions: Cleanse the wound with wound cleanser prior to applying a clean dressing using gauze sponges, not tissue or cotton balls. Peri-Wound Care: Skin Prep 3 x Per Week/30 Days Discharge Instructions: Use skin prep as directed Prim Dressing: Hydrofera Blue Ready Transfer Foam, 4x5 (in/in) 3 x Per Week/30 Days ary Discharge Instructions: Apply to wound bed. Secondary Dressing: Woven Gauze Sponge, Non-Sterile 4x4 in 3 x Per Week/30 Days Discharge Instructions: FOLD INTO A FOURTH (SMALL SQUARE) TO ENSURE THE HYDROFERA BLUE IS TOUCHING DOWN INTO THE WOUND BED. Secondary Dressing: Zetuvit Plus Silicone Border Dressing 4x4 (in/in) 3 x Per Week/30 Days Discharge Instructions: Apply silicone border over primary dressing as directed. 1. I would recommend that we have the patient continue to monitor signs of infection or worsening. I do believe that she is actually making excellent progress with regard to her wound. 2. I am good recommend as well that the patient should continue with the [facility] dressing which I think is doing great. 3 recommend continue with the follow-ups on a every other week basis they are doing a great job taking care of her at the facility. We will see patient back for reevaluation in 1 week here in the clinic. If anything worsens or changes patient will contact our office for additional recommendations. Electronic Signature(s) Unsigned Previous Signature: 12/25/2022 1:16:18 PM Version By: Allen Derry PA-C Entered By: Shawn Stall on 12/25/2022 13:33:21 -------------------------------------------------------------------------------- SuperBill Details Patient Name: Date of Service: [name], [name] NA 12/25/2022 Medical Record Number: [id number] Patient Account Number:  [id number] Date of Birth/Sex: Treating RN: 07-Jan-1946 (77 y.o. F) Primary Care Provider: Daphine Deutscher, Mary-Margaret Other Clinician: Referring Provider: Treating Provider/Extender: Lauretta Grill, Mary-Margaret Weeks in Treatment: 10 Diagnosis Coding ICD-10 Codes Code Description L89.154 Pressure ulcer of sacral region, stage 4 J44.9 Chronic obstructive pulmonary disease, unspecified I10 Essential (primary) hypertension N18.30 Chronic kidney disease, stage 3 unspecified I48.0 Paroxysmal atrial fibrillation F01.A0 Vascular dementia, mild, without behavioral disturbance, psychotic disturbance, mood disturbance, and anxiety Facility Procedures : CPT4 Code: [id number] 1 Description: 1042 - DEB SUBQ TISSUE 20 SQ CM/< ICD-10 Diagnosis Description L89.154 Pressure ulcer of sacral region, stage 4 Modifier: Quantity: 1 Physician Procedures : CPT4 Code Description Modifier [id number] - WC PHYS LEVEL 3 - EST PT ICD-10 Diagnosis Description L89.154 Pressure ulcer of sacral region, stage 4 J44.9 Chronic obstructive pulmonary disease, unspecified [place], [name] ([id number])  [id number] Page 8 of 8 I10 Essential (primary) hypertension N18.30 Chronic kidney disease, stage 3 unspecified Quantity: 1 : [id number] 11042 - WC PHYS SUBQ TISS 20 SQ CM 1 ICD-10 Diagnosis Description L89.154 Pressure ulcer of sacral region, stage 4 Quantity: Electronic Signature(s) Unsigned Previous Signature: 12/25/2022 1:16:28 PM Version By: Allen Derry PA-C Entered By: Shawn Stall on 12/25/2022 13:32:26 Signature(s): Date(s):

## 2022-12-27 ENCOUNTER — Other Ambulatory Visit: Payer: Self-pay

## 2022-12-27 ENCOUNTER — Inpatient Hospital Stay (HOSPITAL_COMMUNITY)
Admission: EM | Admit: 2022-12-27 | Discharge: 2023-01-01 | DRG: 602 | Disposition: A | Discharge status: Skilled Nursing Facility | Payer: Medicare Other | Source: Skilled Nursing Facility | Attending: Internal Medicine | Admitting: Internal Medicine

## 2022-12-27 ENCOUNTER — Encounter (HOSPITAL_COMMUNITY): Payer: Self-pay | Admitting: *Deleted

## 2022-12-27 ENCOUNTER — Emergency Department (HOSPITAL_COMMUNITY): Payer: Medicare Other

## 2022-12-27 DIAGNOSIS — L03311 Cellulitis of abdominal wall: Secondary | ICD-10-CM | POA: Diagnosis present

## 2022-12-27 DIAGNOSIS — I82409 Acute embolism and thrombosis of unspecified deep veins of unspecified lower extremity: Secondary | ICD-10-CM | POA: Diagnosis present

## 2022-12-27 DIAGNOSIS — N289 Disorder of kidney and ureter, unspecified: Secondary | ICD-10-CM

## 2022-12-27 DIAGNOSIS — M810 Age-related osteoporosis without current pathological fracture: Secondary | ICD-10-CM | POA: Diagnosis present

## 2022-12-27 DIAGNOSIS — Z90711 Acquired absence of uterus with remaining cervical stump: Secondary | ICD-10-CM | POA: Diagnosis not present

## 2022-12-27 DIAGNOSIS — I130 Hypertensive heart and chronic kidney disease with heart failure and stage 1 through stage 4 chronic kidney disease, or unspecified chronic kidney disease: Secondary | ICD-10-CM | POA: Diagnosis present

## 2022-12-27 DIAGNOSIS — D631 Anemia in chronic kidney disease: Secondary | ICD-10-CM | POA: Diagnosis present

## 2022-12-27 DIAGNOSIS — E538 Deficiency of other specified B group vitamins: Secondary | ICD-10-CM | POA: Diagnosis present

## 2022-12-27 DIAGNOSIS — F0393 Unspecified dementia, unspecified severity, with mood disturbance: Secondary | ICD-10-CM | POA: Diagnosis present

## 2022-12-27 DIAGNOSIS — Z79890 Hormone replacement therapy (postmenopausal): Secondary | ICD-10-CM

## 2022-12-27 DIAGNOSIS — M6281 Muscle weakness (generalized): Secondary | ICD-10-CM | POA: Diagnosis not present

## 2022-12-27 DIAGNOSIS — Z7901 Long term (current) use of anticoagulants: Secondary | ICD-10-CM

## 2022-12-27 DIAGNOSIS — Z882 Allergy status to sulfonamides status: Secondary | ICD-10-CM

## 2022-12-27 DIAGNOSIS — Z880 Allergy status to penicillin: Secondary | ICD-10-CM

## 2022-12-27 DIAGNOSIS — I824Y2 Acute embolism and thrombosis of unspecified deep veins of left proximal lower extremity: Secondary | ICD-10-CM

## 2022-12-27 DIAGNOSIS — E039 Hypothyroidism, unspecified: Secondary | ICD-10-CM | POA: Diagnosis present

## 2022-12-27 DIAGNOSIS — Z886 Allergy status to analgesic agent status: Secondary | ICD-10-CM

## 2022-12-27 DIAGNOSIS — J4489 Other specified chronic obstructive pulmonary disease: Secondary | ICD-10-CM | POA: Diagnosis present

## 2022-12-27 DIAGNOSIS — Z86718 Personal history of other venous thrombosis and embolism: Secondary | ICD-10-CM

## 2022-12-27 DIAGNOSIS — Z8619 Personal history of other infectious and parasitic diseases: Secondary | ICD-10-CM

## 2022-12-27 DIAGNOSIS — N3281 Overactive bladder: Secondary | ICD-10-CM | POA: Diagnosis present

## 2022-12-27 DIAGNOSIS — E559 Vitamin D deficiency, unspecified: Secondary | ICD-10-CM | POA: Diagnosis present

## 2022-12-27 DIAGNOSIS — I517 Cardiomegaly: Secondary | ICD-10-CM | POA: Diagnosis not present

## 2022-12-27 DIAGNOSIS — R0989 Other specified symptoms and signs involving the circulatory and respiratory systems: Secondary | ICD-10-CM | POA: Diagnosis not present

## 2022-12-27 DIAGNOSIS — D649 Anemia, unspecified: Secondary | ICD-10-CM | POA: Diagnosis not present

## 2022-12-27 DIAGNOSIS — I959 Hypotension, unspecified: Secondary | ICD-10-CM | POA: Diagnosis not present

## 2022-12-27 DIAGNOSIS — I10 Essential (primary) hypertension: Secondary | ICD-10-CM | POA: Diagnosis not present

## 2022-12-27 DIAGNOSIS — J811 Chronic pulmonary edema: Secondary | ICD-10-CM | POA: Diagnosis not present

## 2022-12-27 DIAGNOSIS — Z1152 Encounter for screening for COVID-19: Secondary | ICD-10-CM | POA: Diagnosis not present

## 2022-12-27 DIAGNOSIS — I5033 Acute on chronic diastolic (congestive) heart failure: Secondary | ICD-10-CM | POA: Diagnosis present

## 2022-12-27 DIAGNOSIS — K219 Gastro-esophageal reflux disease without esophagitis: Secondary | ICD-10-CM | POA: Diagnosis present

## 2022-12-27 DIAGNOSIS — A419 Sepsis, unspecified organism: Secondary | ICD-10-CM | POA: Diagnosis not present

## 2022-12-27 DIAGNOSIS — R0602 Shortness of breath: Secondary | ICD-10-CM | POA: Diagnosis not present

## 2022-12-27 DIAGNOSIS — Z833 Family history of diabetes mellitus: Secondary | ICD-10-CM

## 2022-12-27 DIAGNOSIS — L8990 Pressure ulcer of unspecified site, unspecified stage: Secondary | ICD-10-CM | POA: Diagnosis present

## 2022-12-27 DIAGNOSIS — Z981 Arthrodesis status: Secondary | ICD-10-CM | POA: Diagnosis not present

## 2022-12-27 DIAGNOSIS — I5032 Chronic diastolic (congestive) heart failure: Secondary | ICD-10-CM | POA: Diagnosis not present

## 2022-12-27 DIAGNOSIS — L03115 Cellulitis of right lower limb: Principal | ICD-10-CM | POA: Diagnosis present

## 2022-12-27 DIAGNOSIS — L89153 Pressure ulcer of sacral region, stage 3: Secondary | ICD-10-CM | POA: Diagnosis present

## 2022-12-27 DIAGNOSIS — Z881 Allergy status to other antibiotic agents status: Secondary | ICD-10-CM

## 2022-12-27 DIAGNOSIS — J9601 Acute respiratory failure with hypoxia: Secondary | ICD-10-CM | POA: Diagnosis not present

## 2022-12-27 DIAGNOSIS — R531 Weakness: Secondary | ICD-10-CM | POA: Diagnosis not present

## 2022-12-27 DIAGNOSIS — F0394 Unspecified dementia, unspecified severity, with anxiety: Secondary | ICD-10-CM | POA: Diagnosis present

## 2022-12-27 DIAGNOSIS — Z91040 Latex allergy status: Secondary | ICD-10-CM

## 2022-12-27 DIAGNOSIS — R600 Localized edema: Secondary | ICD-10-CM | POA: Diagnosis not present

## 2022-12-27 DIAGNOSIS — I82402 Acute embolism and thrombosis of unspecified deep veins of left lower extremity: Secondary | ICD-10-CM | POA: Diagnosis not present

## 2022-12-27 DIAGNOSIS — N184 Chronic kidney disease, stage 4 (severe): Secondary | ICD-10-CM | POA: Diagnosis present

## 2022-12-27 DIAGNOSIS — E785 Hyperlipidemia, unspecified: Secondary | ICD-10-CM | POA: Diagnosis present

## 2022-12-27 DIAGNOSIS — F32A Depression, unspecified: Secondary | ICD-10-CM | POA: Diagnosis present

## 2022-12-27 DIAGNOSIS — Z6833 Body mass index (BMI) 33.0-33.9, adult: Secondary | ICD-10-CM

## 2022-12-27 DIAGNOSIS — E669 Obesity, unspecified: Secondary | ICD-10-CM | POA: Diagnosis present

## 2022-12-27 DIAGNOSIS — Z888 Allergy status to other drugs, medicaments and biological substances status: Secondary | ICD-10-CM

## 2022-12-27 DIAGNOSIS — R509 Fever, unspecified: Secondary | ICD-10-CM | POA: Diagnosis not present

## 2022-12-27 DIAGNOSIS — M7989 Other specified soft tissue disorders: Secondary | ICD-10-CM | POA: Diagnosis not present

## 2022-12-27 DIAGNOSIS — Z79899 Other long term (current) drug therapy: Secondary | ICD-10-CM

## 2022-12-27 LAB — CBC WITH DIFFERENTIAL/PLATELET
Abs Immature Granulocytes: 0 10*3/uL (ref 0.00–0.07)
Band Neutrophils: 4 %
Basophils Absolute: 0 10*3/uL (ref 0.0–0.1)
Basophils Relative: 0 %
Eosinophils Absolute: 0 10*3/uL (ref 0.0–0.5)
Eosinophils Relative: 0 %
HCT: 26.1 % — ABNORMAL LOW (ref 36.0–46.0)
Hemoglobin: 8.2 g/dL — ABNORMAL LOW (ref 12.0–15.0)
Lymphocytes Relative: 3 %
Lymphs Abs: 1 10*3/uL (ref 0.7–4.0)
MCH: 30.3 pg (ref 26.0–34.0)
MCHC: 31.4 g/dL (ref 30.0–36.0)
MCV: 96.3 fL (ref 80.0–100.0)
Monocytes Absolute: 0.3 10*3/uL (ref 0.1–1.0)
Monocytes Relative: 1 %
Neutro Abs: 33.4 10*3/uL — ABNORMAL HIGH (ref 1.7–7.7)
Neutrophils Relative %: 92 %
Platelets: 323 10*3/uL (ref 150–400)
RBC: 2.71 MIL/uL — ABNORMAL LOW (ref 3.87–5.11)
RDW: 16 % — ABNORMAL HIGH (ref 11.5–15.5)
WBC: 34.8 10*3/uL — ABNORMAL HIGH (ref 4.0–10.5)
nRBC: 0 % (ref 0.0–0.2)

## 2022-12-27 LAB — CULTURE, BLOOD (ROUTINE X 2)
Culture: NO GROWTH
Culture: NO GROWTH
Special Requests: ADEQUATE
Special Requests: ADEQUATE

## 2022-12-27 LAB — COMPREHENSIVE METABOLIC PANEL
ALT: 30 U/L (ref 0–44)
AST: 45 U/L — ABNORMAL HIGH (ref 15–41)
Albumin: 2.8 g/dL — ABNORMAL LOW (ref 3.5–5.0)
Alkaline Phosphatase: 182 U/L — ABNORMAL HIGH (ref 38–126)
Anion gap: 8 (ref 5–15)
BUN: 48 mg/dL — ABNORMAL HIGH (ref 8–23)
CO2: 22 mmol/L (ref 22–32)
Calcium: 7.7 mg/dL — ABNORMAL LOW (ref 8.9–10.3)
Chloride: 105 mmol/L (ref 98–111)
Creatinine, Ser: 2.29 mg/dL — ABNORMAL HIGH (ref 0.44–1.00)
GFR, Estimated: 22 mL/min — ABNORMAL LOW (ref >60)
Glucose, Bld: 91 mg/dL (ref 70–99)
Potassium: 4.4 mmol/L (ref 3.5–5.1)
Sodium: 135 mmol/L (ref 135–145)
Total Bilirubin: 0.9 mg/dL (ref 0.3–1.2)
Total Protein: 6.5 g/dL (ref 6.5–8.1)

## 2022-12-27 LAB — MRSA NEXT GEN BY PCR, NASAL: MRSA by PCR Next Gen: NOT DETECTED

## 2022-12-27 LAB — LACTIC ACID, PLASMA
Lactic Acid, Venous: 1.2 mmol/L (ref 0.5–1.9)
Lactic Acid, Venous: 1.2 mmol/L (ref 0.5–1.9)

## 2022-12-27 LAB — SARS CORONAVIRUS 2 BY RT PCR: SARS Coronavirus 2 by RT PCR: NEGATIVE

## 2022-12-27 MED ORDER — CYANOCOBALAMIN 1000 MCG/ML IJ SOLN
1000.0000 ug | INTRAMUSCULAR | Status: DC
  Filled 2022-12-27: qty 1

## 2022-12-27 MED ORDER — CYANOCOBALAMIN 1000 MCG/ML IJ SOLN: 1000.0000 ug | INTRAMUSCULAR | Status: DC

## 2022-12-27 MED ORDER — LACTATED RINGERS IV BOLUS (SEPSIS)
1000.0000 mL | Freq: Once | INTRAVENOUS | Status: AC
  Administered 2022-12-27: 1000 mL via INTRAVENOUS

## 2022-12-27 MED ORDER — VANCOMYCIN HCL IN DEXTROSE 1-5 GM/200ML-% IV SOLN: 1000.0000 mg | Freq: Once | INTRAVENOUS | Status: DC

## 2022-12-27 MED ORDER — LACTATED RINGERS IV SOLN: INTRAVENOUS | Status: DC

## 2022-12-27 MED ORDER — LEVOFLOXACIN IN D5W 500 MG/100ML IV SOLN: 500.0000 mg | INTRAVENOUS | Status: DC

## 2022-12-27 MED ORDER — LACTATED RINGERS IV SOLN: 150.0000 mL/h | INTRAVENOUS | Status: DC

## 2022-12-27 MED ORDER — VANCOMYCIN HCL 125 MG PO CAPS
125.0000 mg | ORAL_CAPSULE | Freq: Four times a day (QID) | ORAL | Status: DC
  Administered 2022-12-27 (×2): 125 mg via ORAL
  Filled 2022-12-27 (×12): qty 1

## 2022-12-27 MED ORDER — ONDANSETRON HCL 4 MG PO TABS: 4.0000 mg | ORAL_TABLET | Freq: Four times a day (QID) | ORAL | Status: DC | PRN

## 2022-12-27 MED ORDER — VANCOMYCIN HCL 1750 MG/350ML IV SOLN
1750.0000 mg | Freq: Once | INTRAVENOUS | Status: AC
  Administered 2022-12-27: 1750 mg via INTRAVENOUS

## 2022-12-27 MED ORDER — HYDRALAZINE HCL 20 MG/ML IJ SOLN: 5.0000 mg | INTRAMUSCULAR | Status: DC | PRN

## 2022-12-27 MED ORDER — LEVOTHYROXINE SODIUM 75 MCG PO TABS
75.0000 ug | ORAL_TABLET | Freq: Every day | ORAL | Status: DC
  Administered 2022-12-28 – 2023-01-01 (×5): 75 ug via ORAL
  Filled 2022-12-27 (×5): qty 1

## 2022-12-27 MED ORDER — HYDROCODONE-ACETAMINOPHEN 5-325 MG PO TABS
1.0000 | ORAL_TABLET | ORAL | Status: DC | PRN
  Administered 2022-12-28 – 2022-12-29 (×3): 1 via ORAL
  Administered 2022-12-29 – 2022-12-30 (×4): 2 via ORAL
  Administered 2022-12-31 – 2023-01-01 (×2): 1 via ORAL
  Filled 2022-12-27 (×2): qty 2
  Filled 2022-12-27: qty 1
  Filled 2022-12-27: qty 2
  Filled 2022-12-27: qty 1
  Filled 2022-12-27: qty 2
  Filled 2022-12-27 (×3): qty 1

## 2022-12-27 MED ORDER — ACETAMINOPHEN 650 MG RE SUPP
RECTAL | Status: AC
  Filled 2022-12-27: qty 1

## 2022-12-27 MED ORDER — CITALOPRAM HYDROBROMIDE 20 MG PO TABS
40.0000 mg | ORAL_TABLET | Freq: Every day | ORAL | Status: DC
  Administered 2022-12-28 – 2023-01-01 (×5): 40 mg via ORAL
  Filled 2022-12-27 (×5): qty 2

## 2022-12-27 MED ORDER — ONDANSETRON HCL 4 MG/2ML IJ SOLN: 4.0000 mg | Freq: Four times a day (QID) | INTRAMUSCULAR | Status: DC | PRN

## 2022-12-27 MED ORDER — ACETAMINOPHEN 650 MG RE SUPP
650.0000 mg | Freq: Once | RECTAL | Status: AC
  Administered 2022-12-27: 650 mg via RECTAL

## 2022-12-27 MED ORDER — ACETAMINOPHEN 325 MG PO TABS: 325.0000 mg | ORAL_TABLET | Freq: Four times a day (QID) | ORAL | Status: DC | PRN

## 2022-12-27 MED ORDER — MIRTAZAPINE 15 MG PO TABS
7.5000 mg | ORAL_TABLET | Freq: Every day | ORAL | Status: DC
  Administered 2022-12-27 – 2022-12-31 (×5): 7.5 mg via ORAL
  Filled 2022-12-27 (×6): qty 1

## 2022-12-27 MED ORDER — LEVOFLOXACIN IN D5W 500 MG/100ML IV SOLN
500.0000 mg | INTRAVENOUS | Status: DC
  Administered 2022-12-29: 500 mg via INTRAVENOUS
  Filled 2022-12-27: qty 100

## 2022-12-27 MED ORDER — POLYETHYLENE GLYCOL 3350 17 G PO PACK: 17.0000 g | PACK | Freq: Every day | ORAL | Status: DC | PRN

## 2022-12-27 MED ORDER — VANCOMYCIN HCL IN DEXTROSE 1-5 GM/200ML-% IV SOLN: 1000.0000 mg | INTRAVENOUS | Status: DC

## 2022-12-27 MED ORDER — ALBUTEROL SULFATE (2.5 MG/3ML) 0.083% IN NEBU: 2.5000 mg | INHALATION_SOLUTION | RESPIRATORY_TRACT | Status: DC | PRN

## 2022-12-27 MED ORDER — APIXABAN 5 MG PO TABS
5.0000 mg | ORAL_TABLET | Freq: Two times a day (BID) | ORAL | Status: DC
  Administered 2022-12-27 – 2023-01-01 (×10): 5 mg via ORAL
  Filled 2022-12-27 (×10): qty 1

## 2022-12-27 MED ORDER — CYANOCOBALAMIN 1000 MCG/ML IJ SOLN
1000.0000 ug | INTRAMUSCULAR | Status: DC
  Administered 2022-12-27: 1000 ug via SUBCUTANEOUS

## 2022-12-27 MED ORDER — LEVOFLOXACIN IN D5W 750 MG/150ML IV SOLN
750.0000 mg | Freq: Once | INTRAVENOUS | Status: AC
  Administered 2022-12-27: 750 mg via INTRAVENOUS
  Filled 2022-12-27: qty 150

## 2022-12-27 MED ORDER — ACETAMINOPHEN 650 MG RE SUPP: 325.0000 mg | Freq: Four times a day (QID) | RECTAL | Status: DC | PRN

## 2022-12-27 NOTE — Sepsis Progress Note (Signed)
Sepsis protocol followed by [facility] 

## 2022-12-27 NOTE — Progress Notes (Signed)
VAIDA, KERCHNER ([id number]) [id number] Page 1 of 6 Visit Report for 12/25/2022 Arrival Information Details Patient Name: Date of Service: [name], [name] 12/25/2022 12:30 PM Medical Record Number: [id number] Patient Account Number: [id number] Date of Birth/Sex: Treating RN: Apr 07, 1946 (77 y.o. F) Primary Care : Daphine Deutscher, Mary-Margaret Other Clinician: Referring : Treating /Extender: Lauretta Grill, Mary-Margaret Weeks in Treatment: 10 Visit Information History Since Last Visit Added or deleted any medications: No Patient Arrived: Wheel Chair Any new allergies or adverse reactions: No Arrival Time: 12:37 Had a fall or experienced change in No Accompanied By: caregiver activities of daily living that may affect Transfer Assistance: None risk of falls: Patient Identification Verified: Yes Signs or symptoms of abuse/neglect since last visito No Secondary Verification Process Completed: Yes Hospitalized since last visit: No Patient Requires Transmission-Based Precautions: No Implantable device outside of the clinic excluding No Patient Has Alerts: No cellular tissue based products placed in the center since last visit: Has Dressing in Place as Prescribed: Yes Pain Present Now: No Electronic Signature(s) Signed: 12/27/2022 11:53:17 AM By: Thayer Dallas Entered By: Thayer Dallas on 12/25/2022 12:39:15 -------------------------------------------------------------------------------- Encounter Discharge Information Details Patient Name: Date of Service: [name], [name] 12/25/2022 12:30 PM Medical Record Number: [id number] Patient Account Number: [id number] Date of Birth/Sex: Treating RN: [birth year] (77 y.o. Arta Silence Primary Care : Daphine Deutscher, Mary-Margaret Other Clinician: Referring : Treating /Extender: Lauretta Grill, Mary-Margaret Weeks in Treatment: 10 Encounter Discharge Information Items Post  Procedure Vitals Discharge Condition: Stable Temperature (F): 98.6 Ambulatory Status: Wheelchair Pulse (bpm): 54 Discharge Destination: Home Respiratory Rate (breaths/min): 20 Transportation: Private Auto Blood Pressure (mmHg): 160/80 Accompanied By: caregiver Schedule Follow-up Appointment: Yes Clinical Summary of Care: Electronic Signature(s) Signed: 12/25/2022 5:15:20 PM By: Shawn Stall RN, BSN Entered By: Shawn Stall on 12/25/2022 13:34:07 [place]ant[place], [name] ([id number]) [id number] Page 2 of 6 -------------------------------------------------------------------------------- Lower Extremity Assessment Details Patient Name: Date of Service: [name], [name] 12/25/2022 12:30 PM Medical Record Number: [id number] Patient Account Number: [id number] Date of Birth/Sex: Treating RN: 1945/08/02 (77 y.o. F) Primary Care : Daphine Deutscher, Mary-Margaret Other Clinician: Referring : Treating /Extender: Lauretta Grill, Mary-Margaret Weeks in Treatment: 10 Electronic Signature(s) Signed: 12/27/2022 11:53:17 AM By: Thayer Dallas Entered By: Thayer Dallas on 12/25/2022 12:39:32 -------------------------------------------------------------------------------- Multi-Disciplinary Care Plan Details Patient Name: Date of Service: [name], [name] 12/25/2022 12:30 PM Medical Record Number: [id number] Patient Account Number: [id number] Date of Birth/Sex: Treating RN: [birth year] (77 y.o. [name], [name] Primary Care : Daphine Deutscher, Mary-Margaret Other Clinician: Referring : Treating /Extender: Lauretta Grill, Mary-Margaret Weeks in Treatment: 10 Multidisciplinary Care Plan reviewed with physician Active Inactive Necrotic Tissue Nursing Diagnoses: Knowledge deficit related to management of necrotic/devitalized tissue Goals: Necrotic/devitalized tissue will be minimized in the wound bed Date Initiated: 10/16/2022 Target Resolution  Date: 01/24/2023 Goal Status: Active Patient/caregiver will verbalize understanding of reason and process for debridement of necrotic tissue Date Initiated: 10/16/2022 Target Resolution Date: 01/24/2023 Goal Status: Active Interventions: Assess patient pain level pre-, during and post procedure and prior to discharge Provide education on necrotic tissue and debridement process Treatment Activities: Apply topical anesthetic as ordered : 10/16/2022 Enzymatic debridement : 10/16/2022 Notes: Pain, Acute or Chronic Nursing Diagnoses: Pain, acute or chronic: actual or potential Potential alteration in comfort, pain Goals: Patient will verbalize adequate pain control and receive pain control interventions during procedures as needed Date Initiated: 10/16/2022 Target Resolution Date: 01/24/2023 Goal Status: Active Patient/caregiver will verbalize comfort level met [place], [name] ([id number]) [id number] Page 3 of 6 Date  Initiated: 10/16/2022 Date Inactivated: 12/11/2022 Target Resolution Date: 12/27/2022 Goal Status: Met Interventions: Assess comfort goal upon admission Complete pain assessment as per visit requirements Provide education on pain management Treatment Activities: Administer pain control measures as ordered : 10/16/2022 Notes: Pressure Nursing Diagnoses: Knowledge deficit related to management of pressures ulcers Goals: Patient will remain free of pressure ulcers Date Initiated: 10/16/2022 Target Resolution Date: 01/10/2023 Goal Status: Active Interventions: Assess: immobility, friction, shearing, incontinence upon admission and as needed Assess offloading mechanisms upon admission and as needed Assess potential for pressure ulcer upon admission and as needed Provide education on pressure ulcers Notes: Electronic Signature(s) Signed: 12/25/2022 5:15:20 PM By: Shawn Stall RN, BSN Entered By: Shawn Stall on 12/25/2022  13:31:55 -------------------------------------------------------------------------------- Pain Assessment Details Patient Name: Date of Service: [name], [name] 12/25/2022 12:30 PM Medical Record Number: [id number] Patient Account Number: [id number] Date of Birth/Sex: Treating RN: August 21, 1945 (77 y.o. F) Primary Care : Daphine Deutscher, Mary-Margaret Other Clinician: Referring : Treating /Extender: Lauretta Grill, Mary-Margaret Weeks in Treatment: 10 Active Problems Location of Pain Severity and Description of Pain Patient Has Paino No Site Locations Pain Management and Medication PERKINS, [name] ([id number]) [id number] Page 4 of 6 Current Pain Management: Electronic Signature(s) Signed: 12/27/2022 11:53:17 AM By: Thayer Dallas Entered By: Thayer Dallas on 12/25/2022 12:39:25 -------------------------------------------------------------------------------- Patient/Caregiver Education Details Patient Name: Date of Service: [name], [name] 7/31/2024andnbsp12:30 PM Medical Record Number: [id number] Patient Account Number: [id number] Date of Birth/Gender: Treating RN: 1945-08-29 (77 y.o. Arta Silence Primary Care Physician: Daphine Deutscher, Mary-Margaret Other Clinician: Referring Physician: Treating Physician/Extender: Lauretta Grill, Mary-Margaret Weeks in Treatment: 10 Education Assessment Education Provided To: Patient Education Topics Provided Wound/Skin Impairment: Handouts: Caring for Your Ulcer Methods: Explain/Verbal Responses: Reinforcements needed Electronic Signature(s) Signed: 12/25/2022 5:15:20 PM By: Shawn Stall RN, BSN Entered By: Shawn Stall on 12/25/2022 13:32:06 -------------------------------------------------------------------------------- Wound Assessment Details Patient Name: Date of Service: [name], [name] 12/25/2022 12:30 PM Medical Record Number: [id number] Patient Account Number: [id number] Date of  Birth/Sex: Treating RN: 06/07/1945 (77 y.o. F) Primary Care : Daphine Deutscher, Mary-Margaret Other Clinician: Referring : Treating /Extender: Lauretta Grill, Mary-Margaret Weeks in Treatment: 10 Wound Status Wound Number: 1 Primary Pressure Ulcer Etiology: Wound Location: Sacrum Wound Open Wounding Event: Pressure Injury Status: Date Acquired: 08/26/2022 Comorbid Chronic Obstructive Pulmonary Disease (COPD), Congestive Weeks Of Treatment: 10 History: Heart Failure, Coronary Artery Disease, Deep Vein Thrombosis, Clustered Wound: No Hypertension, Peripheral Venous Disease, Osteoarthritis, Dementia Photos [place], [name] ([id number]) [id number] Page 5 of 6 Wound Measurements Length: (cm) 2 Width: (cm) 1 Depth: (cm) 1 Area: (cm) 1.571 Volume: (cm) 1.571 % Reduction in Area: 91.5% % Reduction in Volume: 95% Epithelialization: None Tunneling: No Undermining: No Wound Description Classification: Category/Stage IV Wound Margin: Thickened Exudate Amount: Medium Exudate Type: Serosanguineous Exudate Color: red, brown Foul Odor After Cleansing: No Slough/Fibrino Yes Wound Bed Granulation Amount: None Present (0%) Exposed Structure Necrotic Amount: Large (67-100%) Fascia Exposed: No Necrotic Quality: Adherent Slough Fat Layer (Subcutaneous Tissue) Exposed: Yes Tendon Exposed: No Muscle Exposed: Yes Necrosis of Muscle: No Joint Exposed: No Bone Exposed: No Periwound Skin Texture Texture Color No Abnormalities Noted: No No Abnormalities Noted: No Callus: No Atrophie Blanche: No Crepitus: No Cyanosis: No Excoriation: No Ecchymosis: No Induration: No Erythema: No Rash: No Hemosiderin Staining: No Scarring: No Mottled: No Pallor: No Moisture Rubor: No No Abnormalities Noted: No Dry / Scaly: No Maceration: Yes Treatment Notes Wound #1 (Sacrum) Cleanser Wound Cleanser Discharge Instruction: Cleanse the wound with  wound cleanser prior  to applying a clean dressing using gauze sponges, not tissue or cotton balls. Peri-Wound Care Skin Prep Discharge Instruction: Use skin prep as directed Topical Primary Dressing Hydrofera Blue Ready Transfer Foam, 4x5 (in/in) Discharge Instruction: Apply to wound bed. Secondary Dressing Woven Gauze Sponge, Non-Sterile 4x4 in Discharge Instruction: FOLD INTO A FOURTH (SMALL SQUARE) TO ENSURE THE HYDROFERA BLUE IS TOUCHING DOWN INTO THE WOUND BED. DEVANNY, PALECEK ([id number]) [id number] Page 6 of 6 Zetuvit Plus Silicone Border Dressing 4x4 (in/in) Discharge Instruction: Apply silicone border over primary dressing as directed. Secured With Compression Wrap Compression Stockings Facilities manager) Signed: 12/27/2022 11:53:17 AM By: Thayer Dallas Entered By: Thayer Dallas on 12/25/2022 13:04:42 -------------------------------------------------------------------------------- Vitals Details Patient Name: Date of Service: MERIE, [name] 12/25/2022 12:30 PM Medical Record Number: [id number] Patient Account Number: [id number] Date of Birth/Sex: Treating RN: December 03, 1945 (77 y.o. F) Primary Care : Daphine Deutscher, Mary-Margaret Other Clinician: Referring : Treating /Extender: Lauretta Grill, Mary-Margaret Weeks in Treatment: 10 Vital Signs Time Taken: 12:41 Temperature (F): 98.6 Height (in): 62 Pulse (bpm): 54 Weight (lbs): 139 Respiratory Rate (breaths/min): 18 Body Mass Index (BMI): 25.4 Blood Pressure (mmHg): 160/80 Reference Range: 80 - 120 mg / dl Electronic Signature(s) Signed: 12/27/2022 11:53:17 AM By: Thayer Dallas Entered By: Thayer Dallas on 12/25/2022 12:42:21

## 2022-12-27 NOTE — ED Triage Notes (Signed)
Pt BIB [facility] from [facility]  with complaints of right leg pain and swelling; pt states she woke up this am with redness and pain to right upper leg  Ems reports pt had a fever of 100.8 and pt was given tylenol 1GM   Ems reports pt had O2 sats of 77% at facility and pt was placed on O2 2L via [place] and sats increased to 96%  Pt states she feels some SOB

## 2022-12-27 NOTE — Progress Notes (Signed)
Pharmacy Antibiotic Note  [name] is a 77 y.o. female admitted on 12/27/2022 with  .right leg pain and swelling, fever and shortness of breath .  Pharmacy has been consulted for vancomycin and cefepime dosing.  Patient with tmax of 100.1, wbc elevated at 35, scr elevated at 2.29. Will use traditional dosing given renal function.   Plan: Vancomycin 1750x1 then 1g q24 hours.  Goal trough 15-20 mcg/mL. Cefepime 2g q24 hours  Height: 5\' 4"  (162.6 cm) Weight: 87.5 kg (193 lb) IBW/kg (Calculated) : 54.7  Temp (24hrs), Avg:100.1 F (37.8 C), Min:100.1 F (37.8 C), Max:100.1 F (37.8 C)  Recent Labs  Lab 12/27/22 1428  WBC 34.8*  CREATININE 2.29*    Estimated Creatinine Clearance: 22.4 mL/min (A) (by C-G formula based on SCr of 2.29 mg/dL (H)).    Allergies  Allergen Reactions   Celebrex [Celecoxib] Swelling   Keflet [Cephalexin] Swelling   Penicillins Hives and Swelling   Sulfa Antibiotics Swelling   Kenalog [Triamcinolone Acetonide]     unknown   Latex Other (See Comments)    No reaction listed on MAR   Lisinopril Other (See Comments)    unknown   Mobic [Meloxicam]     SWELLING   Penicillin G Other (See Comments)    No reaction listed on MAR   Thank you for allowing pharmacy to be a part of this patient's care.  Sheppard Coil PharmD., BCPS Clinical Pharmacist 12/27/2022 3:57 PM

## 2022-12-27 NOTE — H&P (Signed)
TRH H&P   Patient Demographics:    [name], is a 77 y.o. female  MRN: [id number]   DOB - [birth year]  Admit Date - 12/27/2022  Outpatient Primary MD for the patient is [name], FNP  Referring MD/NP/PA: PA Sofia  Patient coming from: [facility]  Chief Complaint  Patient presents with   Leg Pain      HPI:    [name]  is a 77 y.o. female, with medical history significant of anxiety, depression, COPD, GERD, hyperlipidemia, hypertension, hypothyroidism, decubitus ulcer, who was discharged last June from hospital due to C. difficile colitis, Klebsiella UTI, acute DVT and cellulitis . -Patient presents from [facility] due to fever, and leg pain, patient reports fever, some dyspnea, increased pain and swelling in the right lower extremity, facility were concerned about cellulitis, so she was sent to ED for further evaluation, she denies any diarrhea, chest pain, she does endorse some nausea, vomiting x 1, there is report of history of dementia, but patient is awake alert x 4 and appropriate. -In ED she was febrile 100.1, leukocytosis of 34 K, creatinine around baseline of 2.29, she has known anemia with hemoglobin of 8.2, lactic acid within normal limit, UA still pending, but she denies any urinary symptoms, her exam significant for right lower extremity cellulitis, she was treated for sepsis in ED and [facility] hospitalist consulted to admit.    Review of systems:      A full 10 point Review of Systems was done, except as stated above, all other Review of Systems were negative.   With Past History of the following :    Past Medical History:  Diagnosis Date   Allergy    Bradycardia    Bronchitis, chronic (HCC)    Chronic anxiety    Complication of anesthesia    hard to wake up   COPD (chronic obstructive pulmonary disease) (HCC)    Depression     Esophageal reflux    Headache(784.0)    Hyperlipidemia    Hypertension    Hypothyroidism    Obesity    Osteoporosis    Overactive bladder    PVC's (premature ventricular contractions)    Thyroid disease    Vitamin D deficiency disease       Past Surgical History:  Procedure Laterality Date   ABDOMINAL HYSTERECTOMY     CENTRAL LINE INSERTION Right 01/10/2022   Procedure: CENTRAL LINE INSERTION;  Surgeon: Lucretia Roers, MD;  Location: [facility];  Service: General;  Laterality: Right;   CHOLECYSTECTOMY N/A 11/04/2013   Procedure: LAPAROSCOPIC CHOLECYSTECTOMY WITH INTRAOPERATIVE CHOLANGIOGRAM;  Surgeon: Wilmon Arms. Corliss Skains, MD;  Location: [facility] OR;  Service: General;  Laterality: N/A;   COLONOSCOPY     FRACTURE SURGERY     pins removed from Hip surgery   HIP FRACTURE SURGERY  1990    pins removed in 1991  IR FLUORO GUIDE CV LINE LEFT  01/31/2022   IR IVC FILTER PLMT / S&I /IMG GUID/MOD SED  10/25/2022   IR US GUIDE VASC ACCESS LEFT  01/31/2022   LAPAROTOMY N/A 01/10/2022   Procedure: EXPLORATORY LAPAROTOMY,;  Surgeon: Lucretia Roers, MD;  Location: [facility];  Service: General;  Laterality: N/A;   LYSIS OF ADHESION N/A 01/10/2022   Procedure: LYSIS OF ADHESION;  Surgeon: Lucretia Roers, MD;  Location: [facility];  Service: General;  Laterality: N/A;   PARTIAL HYSTERECTOMY  1987   SPINAL FUSION     THORACENTESIS N/A 01/18/2022   Procedure: THORACENTESIS;  Surgeon: Lorin Glass, MD;  [facility];  Service: Pulmonary;  Laterality: N/A;   UPPER GASTROINTESTINAL ENDOSCOPY        Social History:     Social History   Tobacco Use   Smoking status: Never   Smokeless tobacco: Never  Substance Use Topics   Alcohol use: No      Family History :     Family History  Problem Relation Age of Onset   Arthritis Other    Heart disease Other    Cancer Other    Diabetes Other    OCD Other       Home Medications:   Prior to Admission medications   Medication Sig Start Date  End Date Taking? Authorizing Provider  acetaminophen (TYLENOL) 325 MG tablet Take 2 tablets (650 mg total) by mouth every 6 (six) hours as needed for mild pain (or Fever >/= 101). 10/01/22   Shon Hale, MD  Amino Acids-Protein Hydrolys (PRO-STAT) LIQD Take 30 mLs by mouth 2 (two) times daily.    [provider]  Apixaban Starter Pack, 10mg  and 5mg , (ELIQUIS DVT/PE STARTER PACK) Take as directed on package: start with two-5mg  tablets twice daily for 7 days. On day 8, switch to one-5mg  tablet twice daily. 10/28/22   Shon Hale, MD  ascorbic acid (VITAMIN C) 500 MG tablet Take 500 mg by mouth 2 (two) times daily.    [provider]  Cholecalciferol (VITAMIN D3) 50 MCG (2000 UT) CAPS Take 2,000 Units by mouth every morning.    [provider]  citalopram (CELEXA) 40 MG tablet Take 1 tablet (40 mg total) by mouth daily. 05/06/22   Daphine Deutscher Mary-Margaret, FNP  feeding supplement, ENSURE COMPLETE, (ENSURE COMPLETE) LIQD Take 237 mLs by mouth 2 (two) times daily between meals. 10/01/22   Shon Hale, MD  ferrous sulfate 325 (65 FE) MG tablet Take 1 tablet (325 mg total) by mouth daily with breakfast. 10/01/22   [name], Courage, MD  furosemide (LASIX) 20 MG tablet Take 1 tablet (20 mg total) by mouth daily. 10/29/22   Shon Hale, MD  glucose 4 GM chewable tablet Chew 1 tablet (4 g total) by mouth as needed for low blood sugar. 10/01/22   Shon Hale, MD  levothyroxine (SYNTHROID) 75 MCG tablet Take 1 tablet (75 mcg total) by mouth daily. 05/07/22   Daphine Deutscher, Mary-Margaret, FNP  Magnesium Oxide 400 MG CAPS Take 1 capsule (400 mg total) by mouth daily. 09/04/22   Burnadette Pop, MD  mirtazapine (REMERON) 7.5 MG tablet Take 1 tablet (7.5 mg total) by mouth at bedtime. For appetite stimulation 10/28/22   Shon Hale, MD  Multiple Vitamin (MULTIVITAMIN) tablet Take 1 tablet by mouth every morning.    [provider]  nutrition supplement, JUVEN, (JUVEN) PACK Take  1 packet by mouth 2 (two) times daily between meals.  [provider]  nystatin (MYCOSTATIN/NYSTOP) powder Apply topically 3 (three) times daily. On groins 09/04/22   Burnadette Pop, MD  OxyCODONE HCl, Abuse Deter, (OXAYDO) 5 MG TABA Take 5 mg by mouth daily as needed (severe pain). 10/28/22   Shon Hale, MD  rOPINIRole (REQUIP XL) 2 MG 24 hr tablet Take 2 mg by mouth at bedtime.    [provider]  sodium bicarbonate 650 MG tablet Take 1 tablet (650 mg total) by mouth 3 (three) times daily. 10/01/22 10/01/23  Shon Hale, MD  vancomycin (VANCOCIN) 125 MG capsule Take 1 capsule (125 mg total) by mouth See admin instructions. Take Vancomycin  125 mg-- 1 capsule twice daily for 7 days, then 1 capsule once daily for 7 days, then 1 capsule every other day for 7 days, then 1 capsule every 3 days for 7 days then Stop---Last dose is 11/25/22 10/28/22   Shon Hale, MD  Zinc 220 (50 Zn) MG CAPS Take 220 mg by mouth every morning. 10/01/22   Shon Hale, MD     Allergies:     Allergies  Allergen Reactions   Celebrex [Celecoxib] Swelling   Keflet [Cephalexin] Swelling   Penicillins Hives and Swelling   Sulfa Antibiotics Swelling   Kenalog [Triamcinolone Acetonide]     unknown   Latex Other (See Comments)    No reaction listed on MAR   Lisinopril Other (See Comments)    unknown   Mobic [Meloxicam]     SWELLING   Penicillin G Other (See Comments)    No reaction listed on MAR     Physical Exam:   Vitals  Blood pressure 101/61, pulse (!) 57, temperature 99.9 F (37.7 C), temperature source Oral, resp. rate 17, height 5\' 4"  (1.626 m), weight 87.5 kg, SpO2 99%.   1. General elderly female lying in bed in NAD,    2. Normal affect and insight, Not Suicidal or Homicidal, Awake Alert, Oriented X 3.  3. No F.N deficits, ALL C.Nerves Intact, Strength 5/5 all 4 extremities, Sensation intact all 4 extremities, Plantars down going.  4. Ears and Eyes appear Normal,  Conjunctivae clear, PERRLA. Moist Oral Mucosa.  5. Supple Neck, No JVD, No cervical lymphadenopathy appriciated, No Carotid Bruits.  6. Symmetrical Chest wall movement, diminished air entry at the bases, no wheezing  7. RRR, No Gallops, Rubs or Murmurs, No Parasternal Heave.  8. Positive Bowel Sounds, Abdomen Soft, No tenderness, No organomegaly appriciated,No rebound -guarding or rigidity.  9.  Right lower extremity erythema, mainly in the thigh area, pressure ulcer in sacral area, please see pictures below  10. Good muscle tone,  joints appear normal , no effusions, Normal ROM.        Data Review:    CBC Recent Labs  Lab 12/27/22 1428  WBC 34.8*  HGB 8.2*  HCT 26.1*  PLT 323  MCV 96.3  MCH 30.3  MCHC 31.4  RDW 16.0*  LYMPHSABS 1.0  MONOABS 0.3  EOSABS 0.0  BASOSABS 0.0   ------------------------------------------------------------------------------------------------------------------  Chemistries  Recent Labs  Lab 12/27/22 1428  NA 135  K 4.4  CL 105  CO2 22  GLUCOSE 91  BUN 48*  CREATININE 2.29*  CALCIUM 7.7*  AST 45*  ALT 30  ALKPHOS 182*  BILITOT 0.9   ------------------------------------------------------------------------------------------------------------------ estimated creatinine clearance is 22.4 mL/min (A) (by C-G formula based on SCr of 2.29 mg/dL (H)). ------------------------------------------------------------------------------------------------------------------ No results for input(s): "TSH", "T4TOTAL", "T3FREE", "THYROIDAB" in the last 72 hours.  Invalid input(s): "FREET3"  Coagulation profile No results for input(s): "INR", "PROTIME" in the last 168 hours. ------------------------------------------------------------------------------------------------------------------- No results for input(s): "DDIMER" in the last 72  hours. -------------------------------------------------------------------------------------------------------------------  Cardiac Enzymes No results for input(s): "CKMB", "TROPONINI", "MYOGLOBIN" in the last 168 hours.  Invalid input(s): "CK" ------------------------------------------------------------------------------------------------------------------    Component Value Date/Time   BNP 264.0 (H) 07/06/2022 1850     ---------------------------------------------------------------------------------------------------------------  Urinalysis    Component Value Date/Time   COLORURINE STRAW (A) 11/12/2022 1607   APPEARANCEUR CLEAR 11/12/2022 1607   APPEARANCEUR Clear 03/01/2021 1257   LABSPEC 1.008 11/12/2022 1607   PHURINE 5.0 11/12/2022 1607   GLUCOSEU NEGATIVE 11/12/2022 1607   HGBUR NEGATIVE 11/12/2022 1607   BILIRUBINUR NEGATIVE 11/12/2022 1607   BILIRUBINUR Negative 03/01/2021 1257   KETONESUR NEGATIVE 11/12/2022 1607   PROTEINUR NEGATIVE 11/12/2022 1607   NITRITE NEGATIVE 11/12/2022 1607   LEUKOCYTESUR NEGATIVE 11/12/2022 1607    ----------------------------------------------------------------------------------------------------------------   Imaging Results:    DG Chest Port 1 View  Result Date: 12/27/2022 CLINICAL DATA:  Shortness of breath EXAM: PORTABLE CHEST 1 VIEW COMPARISON:  X-ray 10/19/2022 FINDINGS: Enlarged cardiopericardial silhouette with calcified aorta. Vascular congestion. Trace edema. No pneumothorax or effusion. No consolidation. Curvature of the spine with degenerative changes. Artifact from the patient's clothing. Film is under penetrated. IMPRESSION: Enlarged heart with vascular congestion and trace edema. Electronically Signed   By: Karen Kays M.D.   On: 12/27/2022 14:56    EKG:  Vent. rate 60 BPM PR interval 154 ms QRS duration 85 ms QT/QTcB 438/438 ms P-R-T axes 54 54 21 Sinus rhythm   Assessment & Plan:    Principal Problem:    Sepsis (HCC) Active Problems:   Left Leg Extensive DVT (deep venous thrombosis) (HCC)   Acquired hypothyroidism   Anemia   Renal insufficiency   Decubitus ulcer   Obesity   Sepsis due to right lower extremity cellulitis  -Nephric and leukocytosis at 34 K, 100.1, still pending, she denies any urinary symptoms, C. difficile still pending as well given recent history of C. difficile colitis, but no abdominal pain or diarrhea currently -See above pictures for right lower extremity cellulitis, admitted under cellulitis/sepsis pathway, given her multiple allergies I will keep her on IV vancomycin and levofloxacin for now -Given her recent C. difficile action, and the need for broad-spectrum antibiotic coverage I will keep her empirically on p.o. vancomycin as well-known history of CHF, will avoid aggressive IV hydration specially with normal lactic acid, will decrease her IV fluid to 75 cc/h -Follow-up blood cultures  Chronic diastolic CHF -Sent echo with EF of 65 to 70% and grade 1 diastolic dysfunction  -Open sepsis I will hold her Lasix, but I will use IV fluid very judicially   Hypothyroidism -Continue with Synthroid  CKD stage IV -creatinine at 2.29, around baseline -Avoid nephrotoxic medications,   Decubitus ulcer Consult wound care, please see above pictures  Anemia of chronic kidney disease B12 deficiency -Hemoglobin at baseline, no indication for transfusion, -Likely will benefit from Procrit, this can be done as an outpatient -B12 level during recent hospitalization at 152, continue with B12 supplement, recheck level in a.m.  Left lower extremity DVT -Diagnosed during recent hospitalization, continue with Eliquis  Anxiety-depression -Continue with home medications   deconditioning -Will consult PT/OT  Obesity class I Body mass index is 33.13 kg/m.   DVT Prophylaxis Eliquis  AM Labs Ordered, also please review Full Orders  Family Communication: Admission,  patients condition and plan of care including tests being ordered have  been discussed with the patient and daughter in law Marchelle Folks who indicate understanding and agree with the plan and Code Status.  Code Status Full code for now as she was during her previous admission as discussed with daughter-in-law  Likely DC to  back to [place]   Condition GUARDED   Consults called: none    Admission status: inpatient    Time spent in minutes : 70 minutes   Huey Bienenstock M.D on 12/27/2022 at 5:58 PM   [facility] - Office  [phone]

## 2022-12-27 NOTE — Progress Notes (Signed)
Spoke with pharmacist, okay to go ahead and give PO and IV vancomycin together and to hang Levaquin after vanc.   IV Levaquin was given in ED, pharmacist edited order see [facility]

## 2022-12-27 NOTE — ED Notes (Signed)
ED TO INPATIENT HANDOFF REPORT  ED Nurse Name and Phone #:   S Name/Age/Gender [name] An 77 y.o. female Room/Bed: APA03/APA03  Code Status   Code Status: Full Code  Home/SNF/Other Skilled nursing facility Patient oriented to: self, place, and situation Is this baseline? Yes   Triage Complete: Triage complete  Chief Complaint Sepsis (HCC) [A41.9]  Triage Note Pt BIB [facility] from [facility]  with complaints of right leg pain and swelling; pt states she woke up this am with redness and pain to right upper leg  Ems reports pt had a fever of 100.8 and pt was given tylenol 1GM   Ems reports pt had O2 sats of 77% at facility and pt was placed on O2 2L via [place] and sats increased to 96%  Pt states she feels some SOB   Allergies Allergies  Allergen Reactions   Celebrex [Celecoxib] Swelling   Keflet [Cephalexin] Swelling   Penicillins Hives and Swelling   Sulfa Antibiotics Swelling   Kenalog [Triamcinolone Acetonide]     unknown   Latex Other (See Comments)    No reaction listed on MAR   Lisinopril Other (See Comments)    unknown   Mobic [Meloxicam]     SWELLING   Penicillin G Other (See Comments)    No reaction listed on MAR    Level of Care/Admitting Diagnosis ED Disposition     ED Disposition  Admit   Condition  --   Comment  Hospital Area: [facility] [[id number]]  Level of Care: Telemetry [5]  Covid Evaluation: Asymptomatic - no recent exposure (last 10 days) testing not required  Diagnosis: Sepsis [facility]) [[id number]]  Admitting Physician: Chiquita Loth  Attending Physician: Randol Kern, DAWOOD S [[id number]]  Certification:: I certify this patient will need inpatient services for at least 2 midnights  Estimated Length of Stay: 3          B Medical/Surgery History Past Medical History:  Diagnosis Date   Allergy    Bradycardia    Bronchitis, chronic (HCC)    Chronic anxiety    Complication of anesthesia    hard to wake up    COPD (chronic obstructive pulmonary disease) (HCC)    Depression    Esophageal reflux    Headache(784.0)    Hyperlipidemia    Hypertension    Hypothyroidism    Obesity    Osteoporosis    Overactive bladder    PVC's (premature ventricular contractions)    Thyroid disease    Vitamin D deficiency disease    Past Surgical History:  Procedure Laterality Date   ABDOMINAL HYSTERECTOMY     CENTRAL LINE INSERTION Right 01/10/2022   Procedure: CENTRAL LINE INSERTION;  Surgeon: Lucretia Roers, MD;  Location: [facility];  Service: General;  Laterality: Right;   CHOLECYSTECTOMY N/A 11/04/2013   Procedure: LAPAROSCOPIC CHOLECYSTECTOMY WITH INTRAOPERATIVE CHOLANGIOGRAM;  Surgeon: Wilmon Arms. Corliss Skains, MD;  Location: [facility];  Service: General;  Laterality: N/A;   COLONOSCOPY     FRACTURE SURGERY     pins removed from Hip surgery   HIP FRACTURE SURGERY  1990    pins removed in 1991   IR FLUORO GUIDE CV LINE LEFT  01/31/2022   IR IVC FILTER PLMT / S&I /IMG GUID/MOD SED  10/25/2022   IR US GUIDE VASC ACCESS LEFT  01/31/2022   LAPAROTOMY N/A 01/10/2022   Procedure: EXPLORATORY LAPAROTOMY,;  Surgeon: Lucretia Roers, MD;  Location: [facility];  Service: General;  Laterality:  N/A;   LYSIS OF ADHESION N/A 01/10/2022   Procedure: LYSIS OF ADHESION;  Surgeon: Lucretia Roers, MD;  Location: [facility];  Service: General;  Laterality: N/A;   PARTIAL HYSTERECTOMY  1987   SPINAL FUSION     THORACENTESIS N/A 01/18/2022   Procedure: THORACENTESIS;  Surgeon: Lorin Glass, MD;  Location[facility] END[facility];  Service: Pulmonary;  Laterality: N/A;   UPPER GASTROINTESTINAL ENDOSCOPY       A IV Location/Drains/Wounds Patient Lines/Drains/Airways Status     Active Line/Drains/Airways     Name Placement date Placement time Site Days   Peripheral IV 12/27/22 20 G 1" Left Antecubital 12/27/22  1610  Antecubital  less than 1   Peripheral IV 12/27/22 20 G 1" Right Hand 12/27/22  1644  Hand  less than 1   Pressure Injury  09/26/22 Buttocks Left;Medial;Mid Stage 4 - Full thickness tissue loss with exposed bone, tendon or muscle. 8.5cm x 8cm x 0.5cm -  wound with tunneling/undermining edges at 12 o'clock to 6 o'clock positions 1cm. Wound base with thick whit 09/26/22  1500  -- 92   Pressure Injury 09/26/22 Heel Left Stage 2 -  Partial thickness loss of dermis presenting as a shallow open injury with a red, pink wound bed without slough. 2.5cm x 2cm x 0 cm. Wound bed pink with some yellow at very center. Periwound area blanchable wi 09/26/22  1500  -- 92   Pressure Injury 10/19/22 Sacrum Medial Stage 4 - Full thickness tissue loss with exposed bone, tendon or muscle. stage 4 wound measuring 6cm x 3cm x 1.5 10/19/22  2113  -- 69   Wound / Incision (Open or Dehisced) 01/23/22 (IAD) Incontinence Associated Dermatitis Thigh Anterior;Right;Left 01/23/22  0800  Thigh  338   Wound / Incision (Open or Dehisced) 10/25/22 Puncture Throat Right from IVC filter placement 10/25/22  1321  Throat  63            Intake/Output Last 24 hours No intake or output data in the 24 hours ending 12/27/22 1746  Labs/Imaging Results for orders placed or performed during the hospital encounter of 12/27/22 (from the past 48 hour(s))  CBC with Differential     Status: Abnormal   Collection Time: 12/27/22  2:28 PM  Result Value Ref Range   WBC 34.8 (H) 4.0 - 10.5 K/uL   RBC 2.71 (L) 3.87 - 5.11 MIL/uL   Hemoglobin 8.2 (L) 12.0 - 15.0 g/dL   HCT 47.8 (L) 29.5 - 62.1 %   MCV 96.3 80.0 - 100.0 fL   MCH 30.3 26.0 - 34.0 pg   MCHC 31.4 30.0 - 36.0 g/dL   RDW 30.8 (H) 65.7 - 84.6 %   Platelets 323 150 - 400 K/uL   nRBC 0.0 0.0 - 0.2 %   Neutrophils Relative % 92 %   Neutro Abs 33.4 (H) 1.7 - 7.7 K/uL   Band Neutrophils 4 %   Lymphocytes Relative 3 %   Lymphs Abs 1.0 0.7 - 4.0 K/uL   Monocytes Relative 1 %   Monocytes Absolute 0.3 0.1 - 1.0 K/uL   Eosinophils Relative 0 %   Eosinophils Absolute 0.0 0.0 - 0.5 K/uL   Basophils Relative 0  %   Basophils Absolute 0.0 0.0 - 0.1 K/uL   WBC Morphology MORPHOLOGY UNREMARKABLE    RBC Morphology MORPHOLOGY UNREMARKABLE    Smear Review MORPHOLOGY UNREMARKABLE    Abs Immature Granulocytes 0.00 0.00 - 0.07 K/uL    Comment: Performed at  [facility], [address], [place], [place] [address]  Comprehensive metabolic panel     Status: Abnormal   Collection Time: 12/27/22  2:28 PM  Result Value Ref Range   Sodium 135 135 - 145 mmol/L   Potassium 4.4 3.5 - 5.1 mmol/L   Chloride 105 98 - 111 mmol/L   CO2 22 22 - 32 mmol/L   Glucose, Bld 91 70 - 99 mg/dL    Comment: Glucose reference range applies only to samples taken after fasting for at least 8 hours.   BUN 48 (H) 8 - 23 mg/dL   Creatinine, Ser 1.06 (H) 0.44 - 1.00 mg/dL   Calcium 7.7 (L) 8.9 - 10.3 mg/dL   Total Protein 6.5 6.5 - 8.1 g/dL   Albumin 2.8 (L) 3.5 - 5.0 g/dL   AST 45 (H) 15 - 41 U/L   ALT 30 0 - 44 U/L   Alkaline Phosphatase 182 (H) 38 - 126 U/L   Total Bilirubin 0.9 0.3 - 1.2 mg/dL   GFR, Estimated 22 (L) >60 mL/min    Comment: (NOTE) Calculated using the CKD-EPI Creatinine Equation (2021)    Anion gap 8 5 - 15    Comment: Performed at [facility], [address], [place], [place] [address]  Blood culture (routine x 2)     Status: None (Preliminary result)   Collection Time: 12/27/22  2:28 PM   Specimen: Right Antecubital; Blood  Result Value Ref Range   Specimen Description      RIGHT ANTECUBITAL BOTTLES DRAWN AEROBIC AND ANAEROBIC   Special Requests Blood Culture adequate volume    Culture      NO GROWTH <12 HOURS Performed at [facility], [address], [place], [place] [address]    Report Status PENDING   Blood culture (routine x 2)     Status: None (Preliminary result)   Collection Time: 12/27/22  2:35 PM   Specimen: BLOOD LEFT ARM  Result Value Ref Range   Specimen Description BLOOD LEFT ARM BOTTLES DRAWN AEROBIC AND ANAEROBIC    Special Requests Blood Culture adequate volume    Culture       NO GROWTH <12 HOURS Performed at [facility], [address] [place], [place] [address]    Report Status PENDING   SARS Coronavirus 2 by RT PCR (hospital order, performed in [facility] hospital lab) *cepheid single result test* Anterior Nasal Swab     Status: None   Collection Time: 12/27/22  3:51 PM   Specimen: Anterior Nasal Swab  Result Value Ref Range   SARS Coronavirus 2 by RT PCR NEGATIVE NEGATIVE    Comment: (NOTE) SARS-CoV-2 target nucleic acids are NOT DETECTED.  The SARS-CoV-2 RNA is generally detectable in upper and lower respiratory specimens during the acute phase of infection. The lowest concentration of SARS-CoV-2 viral copies this assay can detect is 250 copies / mL. A negative result does not preclude SARS-CoV-2 infection and should not be used as the sole basis for treatment or other patient management decisions.  A negative result may occur with improper specimen collection / handling, submission of specimen other than nasopharyngeal swab, presence of viral mutation(s) within the areas targeted by this assay, and inadequate number of viral copies (<250 copies / mL). A negative result must be combined with clinical observations, patient history, and epidemiological information.  Fact Sheet for Patients:   RoadLapTop.co.za  Fact Sheet for Healthcare Providers: http://kim-miller.com/  This test is not yet approved or  cleared by  the [employer] and has been authorized for detection and/or diagnosis of SARS-CoV-2 by FDA under an Emergency Use Authorization (EUA).  This EUA will remain in effect (meaning this test can be used) for the duration of the COVID-19 declaration under Section 564(b)(1) of the Act, 21 U.S.C. section 360bbb-3(b)(1), unless the authorization is terminated or revoked sooner.  Performed at [facility], [address], [place], [place] [address]   Lactic acid, plasma     Status: None    Collection Time: 12/27/22  4:06 PM  Result Value Ref Range   Lactic Acid, Venous 1.2 0.5 - 1.9 mmol/L    Comment: Performed at [facility], [address] [place], [place] [address]   DG Chest Port 1 View  Result Date: 12/27/2022 CLINICAL DATA:  Shortness of breath EXAM: PORTABLE CHEST 1 VIEW COMPARISON:  X-ray 10/19/2022 FINDINGS: Enlarged cardiopericardial silhouette with calcified aorta. Vascular congestion. Trace edema. No pneumothorax or effusion. No consolidation. Curvature of the spine with degenerative changes. Artifact from the patient's clothing. Film is under penetrated. IMPRESSION: Enlarged heart with vascular congestion and trace edema. Electronically Signed   By: Karen Kays M.D.   On: 12/27/2022 14:56    Pending Labs Unresulted Labs (From admission, onward)     Start     Ordered   12/28/22 0500  CBC  Tomorrow morning,   R        12/27/22 1731   12/28/22 0500  Basic metabolic panel  Tomorrow morning,   R        12/27/22 1731   12/28/22 0500  Vitamin B12  Tomorrow morning,   R        12/27/22 1745   12/27/22 1736  C Difficile Quick Screen w PCR reflex  (C Difficile quick screen w PCR reflex panel )  Once, for 24 hours,   TIMED       References:    CDiff Information Tool   12/27/22 1735   12/27/22 1551  Lactic acid, plasma  (Lactic Acid)  Now then every 2 hours,   R (with STAT occurrences)      12/27/22 1550   12/27/22 1411  Urinalysis, Routine w reflex microscopic -Urine, Clean Catch  Once,   URGENT       Question Answer Comment  Specimen Source Urine, Clean Catch   Release to patient Immediate      12/27/22 1410            Vitals/Pain Today's Vitals   12/27/22 1407 12/27/22 1408 12/27/22 1530 12/27/22 1600  BP: 119/64  109/62 101/61  Pulse: 65  (!) 51 (!) 58  Resp: 18  14 16   Temp: 100.1 F (37.8 C)     TempSrc: Oral     SpO2: (!) 88%  97% 99%  Weight:  193 lb (87.5 kg)    Height:  5\' 4"  (1.626 m)    PainSc:  8       Isolation Precautions Enteric  precautions (UV disinfection)  Medications Medications  lactated ringers infusion ( Intravenous New Bag/Given 12/27/22 1613)  vancomycin (VANCOREADY) IVPB 1750 mg/350 mL (has no administration in time range)  levofloxacin (LEVAQUIN) IVPB 500 mg (has no administration in time range)  vancomycin (VANCOCIN) IVPB 1000 mg/200 mL premix (has no administration in time range)  acetaminophen (TYLENOL) tablet 325 mg (has no administration in time range)    Or  acetaminophen (TYLENOL) suppository 325 mg (has no administration in time range)  HYDROcodone-acetaminophen (NORCO/VICODIN) 5-325 MG per  tablet 1-2 tablet (has no administration in time range)  polyethylene glycol (MIRALAX / GLYCOLAX) packet 17 g (has no administration in time range)  ondansetron (ZOFRAN) tablet 4 mg (has no administration in time range)    Or  ondansetron (ZOFRAN) injection 4 mg (has no administration in time range)  albuterol (PROVENTIL) (2.5 MG/3ML) 0.083% nebulizer solution 2.5 mg (has no administration in time range)  hydrALAZINE (APRESOLINE) injection 5 mg (has no administration in time range)  vancomycin (VANCOCIN) capsule 125 mg (has no administration in time range)  cyanocobalamin (VITAMIN B12) injection 1,000 mcg (has no administration in time range)  levofloxacin (LEVAQUIN) IVPB 750 mg (750 mg Intravenous New Bag/Given 12/27/22 1613)  lactated ringers bolus 1,000 mL (1,000 mLs Intravenous New Bag/Given 12/27/22 1610)    And  lactated ringers bolus 1,000 mL (1,000 mLs Intravenous New Bag/Given 12/27/22 1611)    And  lactated ringers bolus 1,000 mL (1,000 mLs Intravenous New Bag/Given 12/27/22 1612)  acetaminophen (TYLENOL) suppository 650 mg (650 mg Rectal Given 12/27/22 1625)    Mobility non-ambulatory     Focused Assessments    R Recommendations: See Admitting Provider Note  Report given to:   Additional Notes:

## 2022-12-27 NOTE — ED Provider Notes (Signed)
[facility] EMERGENCY DEPARTMENT AT [facility] Provider Note   CSN: [id number] Arrival date & time: 12/27/22  1342     History  Chief Complaint  Patient presents with   Leg Pain    [name] is a 77 y.o. female.  Patient sent to the emergency department from the nursing facility where she resides with a chief complaint of fever shortness of breath and increased swelling to her right leg.  Facility is concerned that patient has cellulitis.  Patient is a history of COPD but she is not currently on any oxygen.  She reports her right leg is swollen and painful.  Patient reports that the dressing on her lower leg has become tight.  Patient has a past medical history of COPD, hypertension, venous insufficiency, renal insufficiency, GERD pulmonary vascular congestion left leg DVT and dementia.  The history is provided by the patient. No language interpreter was used.  Leg Pain      Home Medications Prior to Admission medications   Medication Sig Start Date End Date Taking? Authorizing Provider  acetaminophen (TYLENOL) 325 MG tablet Take 2 tablets (650 mg total) by mouth every 6 (six) hours as needed for mild pain (or Fever >/= 101). 10/01/22   Shon Hale, MD  Amino Acids-Protein Hydrolys (PRO-STAT) LIQD Take 30 mLs by mouth 2 (two) times daily.    [provider]  Apixaban Starter Pack, 10mg  and 5mg , (ELIQUIS DVT/PE STARTER PACK) Take as directed on package: start with two-5mg  tablets twice daily for 7 days. On day 8, switch to one-5mg  tablet twice daily. 10/28/22   Shon Hale, MD  ascorbic acid (VITAMIN C) 500 MG tablet Take 500 mg by mouth 2 (two) times daily.    [provider]  Cholecalciferol (VITAMIN D3) 50 MCG (2000 UT) CAPS Take 2,000 Units by mouth every morning.    [provider]  citalopram (CELEXA) 40 MG tablet Take 1 tablet (40 mg total) by mouth daily. 05/06/22   Daphine Deutscher Mary-Margaret, FNP  feeding supplement, ENSURE COMPLETE,  (ENSURE COMPLETE) LIQD Take 237 mLs by mouth 2 (two) times daily between meals. 10/01/22   Shon Hale, MD  ferrous sulfate 325 (65 FE) MG tablet Take 1 tablet (325 mg total) by mouth daily with breakfast. 10/01/22   [name], Courage, MD  furosemide (LASIX) 20 MG tablet Take 1 tablet (20 mg total) by mouth daily. 10/29/22   Shon Hale, MD  glucose 4 GM chewable tablet Chew 1 tablet (4 g total) by mouth as needed for low blood sugar. 10/01/22   Shon Hale, MD  levothyroxine (SYNTHROID) 75 MCG tablet Take 1 tablet (75 mcg total) by mouth daily. 05/07/22   Daphine Deutscher, Mary-Margaret, FNP  Magnesium Oxide 400 MG CAPS Take 1 capsule (400 mg total) by mouth daily. 09/04/22   Burnadette Pop, MD  mirtazapine (REMERON) 7.5 MG tablet Take 1 tablet (7.5 mg total) by mouth at bedtime. For appetite stimulation 10/28/22   Shon Hale, MD  Multiple Vitamin (MULTIVITAMIN) tablet Take 1 tablet by mouth every morning.    [provider]  nutrition supplement, JUVEN, (JUVEN) PACK Take 1 packet by mouth 2 (two) times daily between meals.    [provider]  nystatin (MYCOSTATIN/NYSTOP) powder Apply topically 3 (three) times daily. On groins 09/04/22   Burnadette Pop, MD  OxyCODONE HCl, Abuse Deter, (OXAYDO) 5 MG TABA Take 5 mg by mouth daily as needed (severe pain). 10/28/22   Shon Hale, MD  rOPINIRole (REQUIP XL) 2 MG 24  hr tablet Take 2 mg by mouth at bedtime.    [provider]  sodium bicarbonate 650 MG tablet Take 1 tablet (650 mg total) by mouth 3 (three) times daily. 10/01/22 10/01/23  Shon Hale, MD  vancomycin (VANCOCIN) 125 MG capsule Take 1 capsule (125 mg total) by mouth See admin instructions. Take Vancomycin  125 mg-- 1 capsule twice daily for 7 days, then 1 capsule once daily for 7 days, then 1 capsule every other day for 7 days, then 1 capsule every 3 days for 7 days then Stop---Last dose is 11/25/22 10/28/22   Shon Hale, MD  Zinc 220 (50 Zn) MG CAPS Take 220  mg by mouth every morning. 10/01/22   Shon Hale, MD      Allergies    Celebrex [celecoxib], Keflet [cephalexin], Penicillins, Sulfa antibiotics, Kenalog [triamcinolone acetonide], Latex, Lisinopril, Mobic [meloxicam], and Penicillin g    Review of Systems   Review of Systems  All other systems reviewed and are negative.   Physical Exam Updated Vital Signs BP 119/64 (BP Location: Right Arm)   Pulse 65   Temp 100.1 F (37.8 C) (Oral)   Resp 18   Ht 5\' 4"  (1.626 m)   Wt 87.5 kg   SpO2 (!) 88% Comment: pt placed on O2 at 2L and sats increased ot 96%  BMI 33.13 kg/m  Physical Exam Vitals and nursing note reviewed.  Constitutional:      Appearance: She is well-developed.  HENT:     Head: Normocephalic.  Cardiovascular:     Rate and Rhythm: Normal rate and regular rhythm.  Pulmonary:     Effort: Pulmonary effort is normal.  Abdominal:     General: There is no distension.  Musculoskeletal:        General: Swelling and tenderness present.     Cervical back: Normal range of motion.     Comments: Swelling bilat lower legs,  Right leg red increased warmth,  dressing tight right lower leg,   Skin:    General: Skin is warm.  Neurological:     General: No focal deficit present.     Mental Status: She is alert and oriented to person, place, and time.  Psychiatric:        Mood and Affect: Mood normal.     ED Results / Procedures / Treatments   Labs (all labs ordered are listed, but only abnormal results are displayed) Labs Reviewed  CBC WITH DIFFERENTIAL/PLATELET - Abnormal; Notable for the following components:      Result Value   WBC 34.8 (*)    RBC 2.71 (*)    Hemoglobin 8.2 (*)    HCT 26.1 (*)    RDW 16.0 (*)    Neutro Abs 33.4 (*)    All other components within normal limits  COMPREHENSIVE METABOLIC PANEL - Abnormal; Notable for the following components:   BUN 48 (*)    Creatinine, Ser 2.29 (*)    Calcium 7.7 (*)    Albumin 2.8 (*)    AST 45 (*)     Alkaline Phosphatase 182 (*)    GFR, Estimated 22 (*)    All other components within normal limits  CULTURE, BLOOD (ROUTINE X 2)  CULTURE, BLOOD (ROUTINE X 2)  SARS CORONAVIRUS 2 BY RT PCR  URINALYSIS, ROUTINE W REFLEX MICROSCOPIC    EKG EKG Interpretation Date/Time:  Friday December 27 2022 14:42:16 EDT Ventricular Rate:  60 PR Interval:  154 QRS Duration:  85 QT Interval:  438 QTC Calculation: 438 R Axis:   54  Text Interpretation: Sinus rhythm Abnormal T, consider ischemia, lateral leads Confirmed by Meridee Score [phone]) on 12/27/2022 2:55:35 PM  Radiology DG Chest Port 1 View  Result Date: 12/27/2022 CLINICAL DATA:  Shortness of breath EXAM: PORTABLE CHEST 1 VIEW COMPARISON:  X-ray 10/19/2022 FINDINGS: Enlarged cardiopericardial silhouette with calcified aorta. Vascular congestion. Trace edema. No pneumothorax or effusion. No consolidation. Curvature of the spine with degenerative changes. Artifact from the patient's clothing. Film is under penetrated. IMPRESSION: Enlarged heart with vascular congestion and trace edema. Electronically Signed   By: Karen Kays M.D.   On: 12/27/2022 14:56    Procedures Procedures    Medications Ordered in ED Medications  lactated ringers infusion (has no administration in time range)  vancomycin (VANCOCIN) IVPB 1000 mg/200 mL premix (has no administration in time range)  levofloxacin (LEVAQUIN) IVPB 750 mg (has no administration in time range)  lactated ringers bolus 1,000 mL (has no administration in time range)    And  lactated ringers bolus 1,000 mL (has no administration in time range)    And  lactated ringers bolus 1,000 mL (has no administration in time range)    ED Course/ Medical Decision Making/ A&P Clinical Course as of 12/27/22 1549  Fri Dec 27, 2022  2127 77 year old female brought in from her nursing facility for evaluation of fever and leg pain.  She has compression wraps on both lower extremities but her right lower  extremity is quite red.  No fever here, low pulse ox.  Getting sepsis workup and will likely need admission for IV antibiotics. [MB]    Clinical Course User Index [MB] Terrilee Files, MD                                 Medical Decision Making Patient complains of shortness of breath and pain and swelling to her right lower leg.  Amount and/or Complexity of Data Reviewed Independent Historian: EMS    Details: Patient sent in from the nursing home where she resides with a history of increased swelling to her right lower leg and shortness of breath today patient had a temperature of 100.8 External Data Reviewed: notes.    Details: Nursing facility notes reviewed Labs: ordered. Decision-making details documented in ED Course.    Details: Labs ordered reviewed and interpreted patient has a white blood cell count of 34 Radiology: ordered and independent interpretation performed. Decision-making details documented in ED Course.    Details: Chest x-ray shows increased vascular congestion ECG/medicine tests: ordered and independent interpretation performed. Decision-making details documented in ED Course.    Details: EKG sinus rhythm 60s no acute changes Discussion of management or test interpretation with external provider(s): I spoke with Hospitalist who will see for admission.   Risk OTC drugs. Prescription drug management. Decision regarding hospitalization. Risk Details: Patient appears to have cellulitis of her right lower leg, patient has an elevated white blood cell count of 34 IV antibiotics initiated sepsis labs. Pt placed on 2 liters 02.  Pulse ox improved from 88 to 94.            Final Clinical Impression(s) / ED Diagnoses Final diagnoses:  Cellulitis of right lower leg    Rx / DC Orders ED Discharge Orders     None         Osie Cheeks 12/27/22 1719    Meridee Score  C, MD 12/27/22 2841

## 2022-12-27 NOTE — Plan of Care (Signed)
°  Problem: Coping: °Goal: Level of anxiety will decrease °Outcome: Progressing °  °

## 2022-12-28 DIAGNOSIS — A419 Sepsis, unspecified organism: Secondary | ICD-10-CM | POA: Diagnosis not present

## 2022-12-28 MED ORDER — ZINC OXIDE 40 % EX OINT
TOPICAL_OINTMENT | Freq: Two times a day (BID) | CUTANEOUS | Status: DC
  Filled 2022-12-28: qty 57

## 2022-12-28 MED ORDER — FUROSEMIDE 20 MG PO TABS: 20.0000 mg | ORAL_TABLET | Freq: Every day | ORAL | Status: DC

## 2022-12-28 MED ORDER — FUROSEMIDE 10 MG/ML IJ SOLN
20.0000 mg | Freq: Once | INTRAMUSCULAR | Status: AC
  Administered 2022-12-28: 20 mg via INTRAVENOUS
  Filled 2022-12-28: qty 2

## 2022-12-28 MED ORDER — VANCOMYCIN HCL 125 MG PO CAPS
125.0000 mg | ORAL_CAPSULE | Freq: Four times a day (QID) | ORAL | Status: DC
  Administered 2022-12-28 – 2023-01-01 (×17): 125 mg via ORAL
  Filled 2022-12-28 (×17): qty 1

## 2022-12-28 NOTE — Evaluation (Signed)
Physical Therapy Evaluation Patient Details Name: [name] MRN: [id number] DOB: [birth year] Today's Date: 12/28/2022  History of Present Illness  [name]  is a 77 y.o. female, with medical history significant of anxiety, depression, COPD, GERD, hyperlipidemia, hypertension, hypothyroidism, decubitus ulcer, who was discharged last June from hospital due to C. difficile colitis, Klebsiella UTI, acute DVT and cellulitis .  -Patient presents from [facility] due to fever, and leg pain, patient reports fever, some dyspnea, increased pain and swelling in the right lower extremity, facility were concerned about cellulitis, so she was sent to ED for further evaluation, she denies any diarrhea, chest pain, she does endorse some nausea, vomiting x 1, there is report of history of dementia, but patient is awake alert x 4 and appropriate.  -In ED she was febrile 100.1, leukocytosis of 34 K, creatinine around baseline of 2.29, she has known anemia with hemoglobin of 8.2, lactic acid within normal limit, UA still pending, but she denies any urinary symptoms, her exam significant for right lower extremity cellulitis, she was treated for sepsis in ED and [facility] consulted to admit.    Clinical Impression  On therapist arrival; patient is lying in bed on her right side mostly to take pressure off sacral wound.  Patients gown and chuck pad are wet.  Patient needs moderate Assistance due to leg and core weakness and leg pain for supine to sitting on the edge of the bed.  Once sitting on the edge of the bed she demonstrates good sitting balance when feet and arms are supported.  PT assists patient in changing her gown and has her stand 3 times to assist her with cleaning up and changing wet chuck pad.  Patient needs moderate assistance and assist stabilizing the walker for sit to stand each time.  Once standing, she relies heavily on use of her upper extremities and demonstrates flexed standing posture.   Patient complains of heel pain with standing.  Once patient returns to sitting; she needs moderate assist with sit to supine with both her legs and trunk due to leg and core weakness and leg pain.  PT left patient in bed with bed alarm set and nurse call button in reach; notified nursing of above.  Patient will benefit from continued skilled therapy services during the remainder of her hospital stay and at the next recommended venue of care to address deficits and promote return to optimal function.            If plan is discharge home, recommend the following: A lot of help with bathing/dressing/bathroom;A lot of help with walking and/or transfers;Assist for transportation   Can travel by private vehicle   No    Equipment Recommendations None recommended by PT  Recommendations for Other Services       Functional Status Assessment Patient has had a recent decline in their functional status and demonstrates the ability to make significant improvements in function in a reasonable and predictable amount of time.     Precautions / Restrictions Precautions Precautions: Fall Restrictions Weight Bearing Restrictions: No      Mobility  Bed Mobility Overal bed mobility: Needs Assistance Bed Mobility: Supine to Sit     Supine to sit: Mod assist     General bed mobility comments: patient needs mod assist for supine to sit due to leg and core weakness; pain in leg and buttocks (sacral wound) Patient Response: Cooperative  Transfers Overall transfer level: Needs assistance Equipment used: Rolling walker (2 wheels)  Transfers: Sit to/from Stand Sit to Stand: Mod assist           General transfer comment: mod A and A to steady the walker for sit to stand x 3 reps today    Ambulation/Gait               General Gait Details: did not attempt ambulation as patient complains of heel pain with standing  Stairs            Wheelchair Mobility     Tilt Bed Tilt  Bed Patient Response: Cooperative  Modified Rankin (Stroke Patients Only)       Balance Overall balance assessment: Needs assistance Sitting-balance support: Bilateral upper extremity supported, Feet supported Sitting balance-Leahy Scale: Good Sitting balance - Comments: good sitting balance on edge of bed   Standing balance support: Bilateral upper extremity supported, Reliant on assistive device for balance, During functional activity Standing balance-Leahy Scale: Fair Standing balance comment: fair standing balance with RW; has to lean heavily on walker with upper extremities due to leg weakness and pain                             Pertinent Vitals/Pain Pain Assessment Pain Assessment: 0-10 Pain Score: 8  Pain Location: buttocks; sacral wound; legs and heels burn Pain Descriptors / Indicators: Burning, Discomfort, Tender, Throbbing, Tightness Pain Intervention(s): Limited activity within patient's tolerance, Monitored during session    Home Living Family/patient expects to be discharged to:: Skilled nursing facility                   Additional Comments: Lindaann Pascal    Prior Function Prior Level of Function : Needs assist       Physical Assist : Mobility (physical);ADLs (physical) Mobility (physical): Bed mobility;Transfers;Gait;Stairs ADLs (physical): IADLs Mobility Comments: Household ambulator using RW ADLs Comments: was Independent prior to SNF admission, since assisted by SNF staff     Hand Dominance   Dominant Hand: Right    Extremity/Trunk Assessment   Upper Extremity Assessment Upper Extremity Assessment: Generalized weakness    Lower Extremity Assessment Lower Extremity Assessment: Generalized weakness    Cervical / Trunk Assessment Cervical / Trunk Assessment: Kyphotic  Communication   Communication: HOH  Cognition Arousal/Alertness: Awake/alert Behavior During Therapy: WFL for tasks assessed/performed Overall Cognitive  Status: Within Functional Limits for tasks assessed                                 General Comments: history of dementia but patient alert and oriented x 3 today        General Comments      Exercises     Assessment/Plan    PT Assessment Patient needs continued PT services  PT Problem List Decreased strength;Decreased activity tolerance;Decreased balance;Decreased mobility;Pain       PT Treatment Interventions Balance training;Gait training;Functional mobility training;Patient/family education;Therapeutic activities;Therapeutic exercise    PT Goals (Current goals can be found in the Care Plan section)  Acute Rehab PT Goals Patient Stated Goal: return to [facility] PT Goal Formulation: With patient Time For Goal Achievement: 01/11/23 Potential to Achieve Goals: Good    Frequency Min 2X/week     Co-evaluation               AM-PAC PT "6 Clicks" Mobility  Outcome Measure Help needed turning from your back to your side while  in a flat bed without using bedrails?: A Lot Help needed moving from lying on your back to sitting on the side of a flat bed without using bedrails?: A Lot Help needed moving to and from a bed to a chair (including a wheelchair)?: A Lot Help needed standing up from a chair using your arms (e.g., wheelchair or bedside chair)?: A Lot Help needed to walk in hospital room?: A Lot Help needed climbing 3-5 steps with a railing? : Total 6 Click Score: 11    End of Session   Activity Tolerance: Patient limited by pain Patient left: in bed;with call bell/phone within reach;with bed alarm set Nurse Communication: Mobility status PT Visit Diagnosis: Other abnormalities of gait and mobility (R26.89);Unsteadiness on feet (R26.81);Muscle weakness (generalized) (M62.81);Pain;Difficulty in walking, not elsewhere classified (R26.2) Pain - part of body: Leg (buttocks; sacral wound)    Time: 5366-4403 PT Time Calculation (min) (ACUTE ONLY):  29 min   Charges:   PT Evaluation $PT Eval Low Complexity: 1 Low   PT General Charges $$ ACUTE PT VISIT: 1 Visit         12:59 PM, 12/28/22  Small  MPT [facility] [place] [phone] Ph:[phone]

## 2022-12-28 NOTE — Progress Notes (Addendum)
[facility] PROGRESS NOTE  [name] (DOB: 08/16/1945) WUJ:[id number] PCP: [name], FNP  Brief Narrative: [name] is a 77 y.o. female with a history of COPD, sacral decubitus ulcer, HTN, HLD, hypothyroidism, depression/anxiety, GERD and recent admission (discharged 6/3) for UTI, C. difficile colitis, acute DVT and cellulitis who presented to the ED from [facility] on 12/27/2022 with fever, swelling, redness, and pain in the right leg. She had a low grade fever, leukocytosis to 34.8k, normal lactic acid, and clinical evidence of cellulitis.   Subjective: Pain in the right leg is stable, no fevers overnight. Her legs swelling is worse than usual, had unna boots taken off. She's able to eat/drink without issues.   Objective: BP 129/85 (BP Location: Right Arm)   Pulse (!) 55   Temp 98.8 F (37.1 C) (Oral)   Resp 18   Ht 5\' 4"  (1.626 m)   Wt 87.5 kg   SpO2 99%   BMI 33.13 kg/m   Gen: No distress, elderly female Pulm: Crackles at bases, no wheezes, nonlabored with supplemental oxygen  CV: RRR, II/VI systolic murmur without rub or gallop, No JVD, 2+ pitting LE edema.  GI: Soft, NT, ND, +BS Neuro: Alert and oriented. No new focal deficits. Ext: Warm, no deformities. Skin: Right anterolateral upper thigh with tender erythema, without fluctuance. Sacral dressing is c/d/I, not uncovered this morning.   CXR: Cardiomegaly, vascular congestion, no infiltrate  Assessment & Plan: Sepsis due to right leg cellulitis:  - Continue levaquin (complicated by allergies to PCN, sulfa). Given lack of purulence on exam, renal impairment, will hold further vancomycin for now.  - Monitor blood cultures - Hemodynamically stable, lactic acid wnl, stop IVF. WBC coming down 34 > 24k.   Acute on chronic HFpEF: LVEF 65-70%, G1DD.  - Give lasix IV now, stop IVF, and plan to restart home lasix tomorrow. Monitor I/O, weight.  History of C. difficile colitis: With her need for broad  antimicrobial therapy and risk of complication from recurrent C. diff, we have started po vancomycin. Will continue this for now. Not having diarrhea.   History of UTI:  - Urinalysis not yet collected, asked RN to send.   Stage 3 sacral decubitus ulcer: No evidence of infection on photo documentation.  - Continue to offload as able, optimize nutritional status, wound care per WOC.  History of DVT of left calf veins, popliteal vein, and SFV: by U/S 5/29.  - Continue eliquis  Anemia of CKD: Hgb near baseline, no bleeding noted.  - Repeat in AM with transfusion threshold of 7g/dl.   Anxiety/depression:  - Continue celexa, remeron  Hypothyroidism: Last TSH 1.070 - Continue synthroid  Deconditioning:  - PT/OT consulted. Pt likely to return to SNF once inpatient level of care no longer required.   Obesity: Body mass index is 33.13 kg/m.   Tyrone Nine, MD [facility] [email or web address] 12/28/2022, 11:30 AM

## 2022-12-28 NOTE — Plan of Care (Signed)
  Problem: Acute Rehab PT Goals(only PT should resolve) Goal: Pt Will Go Supine/Side To Sit Outcome: Progressing Flowsheets (Taken 12/28/2022 1300) Pt will go Supine/Side to Sit: with minimal assist Goal: Patient Will Transfer Sit To/From Stand Outcome: Progressing Flowsheets (Taken 12/28/2022 1300) Patient will transfer sit to/from stand: with minimal assist Goal: Pt Will Transfer Bed To Chair/Chair To Bed Outcome: Progressing Flowsheets (Taken 12/28/2022 1300) Pt will Transfer Bed to Chair/Chair to Bed: with min assist Goal: Pt Will Ambulate Outcome: Progressing Flowsheets (Taken 12/28/2022 1300) Pt will Ambulate:  10 feet  with minimal assist  with rolling walker

## 2022-12-28 NOTE — Progress Notes (Signed)
Patient rested through the night, no complaints of pain, plan of care ongoing.

## 2022-12-28 NOTE — TOC Initial Note (Signed)
Transition of Care [facility]) - Initial/Assessment Note    Patient Details  Name: [name] MRN: [id number] Date of Birth: Mar 03, 1946  Transition [facility]) CM/SW Contact:    Catalina Gravel, LCSW Phone Number: 12/28/2022, 5:19 PM  Clinical Narrative:                 Pt from [facility].  Red 30 day readmission assessment- completed.  Needs TOC Assessment. CSW attempted to contact son no answer, clarify plan to return to [facility]. CSW unable to assess pt today. DC ready 2 days.   Expected Discharge Plan: Skilled Nursing Facility Barriers to Discharge: Continued Medical Work up   Patient Goals and CMS Choice            Expected Discharge Plan and Services                                              Prior Living Arrangements/Services                       Activities of Daily Living Home Assistive Devices/Equipment: Eyeglasses, Environmental consultant (specify type) (rolling walker) ADL Screening (condition at time of admission) Patient's cognitive ability adequate to safely complete daily activities?: Yes Is the patient deaf or have difficulty hearing?: Yes Does the patient have difficulty seeing, even when wearing glasses/contacts?: No Does the patient have difficulty concentrating, remembering, or making decisions?: Yes Patient able to express need for assistance with ADLs?: Yes Does the patient have difficulty dressing or bathing?: Yes Independently performs ADLs?: No Communication: Independent Dressing (OT): Needs assistance Is this a change from baseline?: Pre-admission baseline Grooming: Needs assistance Is this a change from baseline?: Pre-admission baseline Feeding: Independent Bathing: Needs assistance Is this a change from baseline?: Pre-admission baseline Toileting: Needs assistance Is this a change from baseline?: Pre-admission baseline In/Out Bed: Needs assistance Is this a change from baseline?: Pre-admission baseline Walks in Home: Needs assistance Is this  a change from baseline?: Pre-admission baseline Does the patient have difficulty walking or climbing stairs?: Yes Weakness of Legs: Both Weakness of Arms/Hands: None  Permission Sought/Granted                  Emotional Assessment              Admission diagnosis:  Cellulitis of right lower leg [L03.115] Sepsis (HCC) [A41.9] Patient Active Problem List   Diagnosis Date Noted   Sepsis (HCC) 12/27/2022   Left Leg Extensive DVT (deep venous thrombosis) (HCC) 10/24/2022   UTI (urinary tract infection) 10/21/2022   Thyroid disease 10/20/2022   C. difficile colitis 10/20/2022   Pulmonary vascular congestion 10/20/2022   AKI (acute kidney injury) (HCC) 10/19/2022   Dysphagia 10/07/2022   Obesity 10/07/2022   Dementia with behavioral disturbance (HCC) 09/30/2022   Hypoglycemia 09/26/2022   Frequent falls 08/30/2022   Fall at home, initial encounter 08/29/2022   Low back pain 08/29/2022   Failure to thrive in adult 08/29/2022   Physical deconditioning 08/29/2022   Elevated CK 08/29/2022   Decubitus ulcer 08/29/2022   GERD (gastroesophageal reflux disease) 08/29/2022   Restless leg syndrome 08/29/2022   Rhabdomyolysis 07/07/2022   Dehydration 07/07/2022   Transaminitis 07/07/2022   Hypotension 07/07/2022   SIRS (systemic inflammatory response syndrome) (HCC) 07/07/2022   Hypokalemia 07/07/2022   Stage 3b chronic kidney disease (CKD) (HCC)  07/07/2022   Hypothermia 07/07/2022   Acute metabolic encephalopathy 07/06/2022   BMI 26.0-26.9,adult 05/06/2022   Atrial fibrillation (HCC) 04/12/2022   Congenital heart failure (HCC) 04/12/2022   Malnutrition of moderate degree 01/14/2022   Respiratory failure requiring intubation (HCC) 01/11/2022    Class: Acute   Septic shock (HCC) 01/10/2022   Pressure injury of skin 01/10/2022   Small bowel obstruction (HCC) 01/07/2022   Abnormal weight loss 11/28/2021   Family history of malignant neoplasm of digestive organs 11/28/2021    Intestinal malabsorption 11/28/2021   Renal insufficiency 12/08/2020   Degeneration of lumbar intervertebral disc 01/07/2020   Venous (peripheral) insufficiency 09/30/2019   Anemia 08/12/2017   Vitamin D deficiency 08/12/2017   Lumbar spondylosis with myelopathy 06/03/2017   Primary osteoarthritis of both knees 03/19/2017   Anxiety 02/17/2017   History of pulmonary embolus (PE) 07/09/2016   COPD (chronic obstructive pulmonary disease) (HCC) 01/18/2016   Mixed hyperlipidemia 01/18/2016   Depression 01/18/2016   Essential hypertension 01/18/2016   H/O spinal fusion 01/18/2016   Acquired hypothyroidism 01/18/2016   Thoracic aorta atherosclerosis (HCC) 01/18/2016   Chronic cholecystitis with calculus 10/04/2013   PCP:  [name], FNP Pharmacy:   [facility] - [place], [place] - [address] [place] [address] Phone: [phone] Fax: [phone]  [facility] - [place], [place] - [address] [place] [address] Phone: [phone] Fax: [phone]     Social Determinants of Health (SDOH) Social History: SDOH Screenings   Food Insecurity: Patient Declined (12/27/2022)  Housing: Patient Declined (12/27/2022)  Transportation Needs: Patient Declined (12/27/2022)  Utilities: Not At Risk (12/27/2022)  Alcohol Screen: Low Risk  (12/02/2022)  Depression (PHQ2-9): Low Risk  (12/02/2022)  Financial Resource Strain: Low Risk  (12/02/2022)  Physical Activity: Insufficiently Active (12/02/2022)  Social Connections: Moderately Isolated (12/02/2022)  Stress: No Stress Concern Present (12/02/2022)  Tobacco Use: Low Risk  (12/27/2022)   SDOH Interventions:     Readmission Risk Interventions    12/28/2022    5:15 PM 10/21/2022   10:12 AM 08/30/2022    3:36 PM  Readmission Risk Prevention Plan  Transportation Screening Complete Complete Complete  PCP or Specialist Appt within 3-5 Days   Complete  Social Work Consult for  Recovery Care Planning/Counseling   Complete  Palliative Care Screening   Not Applicable  Medication Review Oceanographer) Complete Complete Complete  PCP or Specialist appointment within 3-5 days of discharge Complete    HRI or Home Care Consult Complete Complete   SW Recovery Care/Counseling Consult Complete Complete   Palliative Care Screening Not Applicable Not Applicable   Skilled Nursing Facility Complete Complete

## 2022-12-28 NOTE — Progress Notes (Signed)
Called by RN for some spreading erythema now involving lower abdomen noticed after bathing this afternoon. Not very tender per pt, no complaints. No immediately recent medications, was only using soap and water for bathing. VSS with temp 99.14F, 67bpm, 124/67. Erythema seen on video on right leg is anterolateral, now newly involves lower abdomen stopping at a transverse fold across belly, extends slightly beyond midline. It is faint and pt does not report tenderness. No exudate noted.   We will continue monitoring and RN will demarcate current area involved with erythema.   Hazeline Junker, MD 12/28/2022 5:54 PM

## 2022-12-28 NOTE — Progress Notes (Signed)
Patient having increased redness to right leg. New redness noted across low abdomen with definitive borders. Temp 99.1, other VSS. Dr. Jarvis Newcomer made aware. Redness marked with skin-safe marker.

## 2022-12-28 NOTE — Consult Note (Signed)
WOC Nurse Consult Note: this consult performed remotely after review of EMR including photo documentation and chat with bedside nurse; this patient is familiar to [facility] team from prior admissions; [facility] and was last seen there 12/25/2022;  [facility] utilizing [facility] which is not on formulary in the [facility] system  Reason for Consult: Pressure ulcer  Wound type: Chronic Stage 3 L sacrum  Pressure Injury POA: Yes Measurement: see nursing flowsheet  Wound bed: clean pink non-granulating  Drainage (amount, consistency, odor)  see nursing flowsheet  Periwound: erythema  Dressing procedure/placement/frequency: Clean sacral wound with NS, apply silver hydrofiber [place] [phone]) cut to fit wound bed making sure silver makes contact with wound bed and is tucked into any undermining. Cover with dry gauze.   Apply a thin layer of Desitin to skin of buttocks and sacrum twice daily and prn soiling.  May cover with ABD pad and tape or silicone foam whichever is preferred.   Patient would benefit from low air loss mattress for pressure redistribution and moisture management.    Patient should resume care and wound dressings ordered by [facility] at discharge.   POC discussed with bedside nurse.  WOC team will not follow at this time.  Re-consult if further needs arise.   Thank you,    Priscella Mann MSN, RN-BC, [employer]  [phone]

## 2022-12-29 DIAGNOSIS — D649 Anemia, unspecified: Secondary | ICD-10-CM

## 2022-12-29 DIAGNOSIS — A419 Sepsis, unspecified organism: Secondary | ICD-10-CM | POA: Diagnosis not present

## 2022-12-29 DIAGNOSIS — L03115 Cellulitis of right lower limb: Secondary | ICD-10-CM

## 2022-12-29 LAB — BASIC METABOLIC PANEL WITH GFR
Anion gap: 6 (ref 5–15)
BUN: 47 mg/dL — ABNORMAL HIGH (ref 8–23)
CO2: 22 mmol/L (ref 22–32)
Calcium: 7.6 mg/dL — ABNORMAL LOW (ref 8.9–10.3)
Chloride: 104 mmol/L (ref 98–111)
Creatinine, Ser: 2.33 mg/dL — ABNORMAL HIGH (ref 0.44–1.00)
GFR, Estimated: 21 mL/min — ABNORMAL LOW (ref >60)
Glucose, Bld: 74 mg/dL (ref 70–99)
Potassium: 4.2 mmol/L (ref 3.5–5.1)
Sodium: 132 mmol/L — ABNORMAL LOW (ref 135–145)

## 2022-12-29 LAB — CBC
HCT: 25.6 % — ABNORMAL LOW (ref 36.0–46.0)
Hemoglobin: 7.8 g/dL — ABNORMAL LOW (ref 12.0–15.0)
MCH: 29.1 pg (ref 26.0–34.0)
MCHC: 30.5 g/dL (ref 30.0–36.0)
MCV: 95.5 fL (ref 80.0–100.0)
Platelets: 258 10*3/uL (ref 150–400)
RBC: 2.68 MIL/uL — ABNORMAL LOW (ref 3.87–5.11)
RDW: 15.7 % — ABNORMAL HIGH (ref 11.5–15.5)
WBC: 13 10*3/uL — ABNORMAL HIGH (ref 4.0–10.5)
nRBC: 0 % (ref 0.0–0.2)

## 2022-12-29 LAB — GLUCOSE, CAPILLARY: Glucose-Capillary: 88 mg/dL (ref 70–99)

## 2022-12-29 MED ORDER — FUROSEMIDE 10 MG/ML IJ SOLN
40.0000 mg | Freq: Once | INTRAMUSCULAR | Status: AC
  Administered 2022-12-29: 40 mg via INTRAVENOUS
  Filled 2022-12-29: qty 4

## 2022-12-29 NOTE — Progress Notes (Signed)
[facility] PROGRESS NOTE   [name] QIO:[id number] DOB: May 18, 1946 DOA: 12/27/2022  PCP: [name], FNP  Brief History/Interval Summary: 77 y.o. female with a history of COPD, sacral decubitus ulcer, HTN, HLD, hypothyroidism, depression/anxiety, GERD and recent admission (discharged 6/3) for UTI, C. difficile colitis, acute DVT and cellulitis who presented to the ED from [facility] on 12/27/2022 with fever, swelling, redness, and pain in the right leg. She had a low grade fever, leukocytosis to 34.8k, normal lactic acid, and clinical evidence of cellulitis.    Consultants: None  Procedures: None    Subjective/Interval History: Patient mentioned that her redness appears to be much better over her right lower extremity as well as lower abdomen.  Denies any pain this morning.  No nausea vomiting.  Slept well.    Assessment/Plan:  Right lower extremity cellulitis/abdominal wall cellulitis/sepsis, present on admission Patient with allergies to penicillin and sulfa.  Started on Levaquin.  There was concern for progression of cellulitis last night.  However patient mentions that the redness is looking better today.  Will not make any changes to her antibiotic regimen at this time. She is afebrile.  Lactic acid level was normal.  WBC has improved significantly from 34.8-13.0 this morning.   Acute on chronic HFpEF LVEF 65-70%, G1DD.  Given a dose of Lasix yesterday.  Will repeat a dose today.  Creatinine is elevated but stable this morning compared to yesterday.  Monitor labs daily.  Monitor ins and outs.  Daily weights.   History of C. difficile colitis Empirically on prophylactic oral vancomycin.  Denies any diarrhea.  Continue for about 7 days after she finishes antibacterial agents.   Chronic kidney disease stage IV Creatinine close to baseline.  Avoid nephrotoxic agents.  Monitor urine output.  History of UTI:  Denies any urinary symptoms currently   Stage 3  sacral decubitus ulcer No evidence of infection on photo documentation.  - Continue to offload as able, optimize nutritional status, wound care per WOC.   History of DVT of left calf veins, popliteal vein, and SFV: by U/S 5/29.  - Continue eliquis   Anemia of CKD Hgb near baseline, no bleeding noted.    Anxiety/depression:  - Continue celexa, remeron   Hypothyroidism : Last TSH 1.070 - Continue synthroid   Deconditioning:  - PT/OT consulted. Pt likely to return to SNF once inpatient level of care no longer required.    Obesity Estimated body mass index is 33.13 kg/m as calculated from the following:   Height as of this encounter: 5\' 4"  (1.626 m).   Weight as of this encounter: 87.5 kg.   DVT Prophylaxis: On apixaban Code Status: Full code Family Communication: No family at bedside.  Discussed with patient Disposition Plan: SNF when ready for discharge     Medications: Scheduled:  apixaban  5 mg Oral BID   citalopram  40 mg Oral Daily   furosemide  40 mg Intravenous Once   levothyroxine  75 mcg Oral QAC breakfast   liver oil-zinc oxide   Topical BID   mirtazapine  7.5 mg Oral QHS   vancomycin  125 mg Oral QID   Continuous:  levofloxacin (LEVAQUIN) IV     LKG:[id number] **OR** acetaminophen, albuterol, hydrALAZINE, HYDROcodone-acetaminophen, ondansetron **OR** ondansetron (ZOFRAN) IV, polyethylene glycol  Antibiotics: Anti-infectives (From admission, onward)    Start     Dose/Rate Route Frequency Ordered Stop   12/29/22 1600  levofloxacin (LEVAQUIN) IVPB 500 mg  500 mg 100 mL/hr over 60 Minutes Intravenous Every 48 hours 12/27/22 2234     12/28/22 1800  vancomycin (VANCOCIN) IVPB 1000 mg/200 mL premix  Status:  Discontinued        1,000 mg 200 mL/hr over 60 Minutes Intravenous Every 24 hours 12/27/22 1558 12/28/22 1221   12/28/22 1130  vancomycin (VANCOCIN) capsule 125 mg        125 mg Oral 4 times daily 12/28/22 1038     12/27/22 1800  vancomycin  (VANCOCIN) capsule 125 mg  Status:  Discontinued        125 mg Oral 4 times daily 12/27/22 1735 12/28/22 1038   12/27/22 1600  vancomycin (VANCOCIN) IVPB 1000 mg/200 mL premix  Status:  Discontinued        1,000 mg 200 mL/hr over 60 Minutes Intravenous  Once 12/27/22 1549 12/27/22 1557   12/27/22 1600  levofloxacin (LEVAQUIN) IVPB 750 mg        750 mg 100 mL/hr over 90 Minutes Intravenous  Once 12/27/22 1549 12/28/22 0740   12/27/22 1600  vancomycin (VANCOREADY) IVPB 1750 mg/350 mL        1,750 mg 175 mL/hr over 120 Minutes Intravenous  Once 12/27/22 1558 12/28/22 0740   12/27/22 1600  levofloxacin (LEVAQUIN) IVPB 500 mg  Status:  Discontinued        500 mg 100 mL/hr over 60 Minutes Intravenous Every 48 hours 12/27/22 1558 12/27/22 2234       Objective:  Vital Signs  Vitals:   12/28/22 1411 12/28/22 1731 12/28/22 2007 12/29/22 0303  BP: 130/67 124/67 135/71 122/64  Pulse: 60 67 (!) 57 (!) 51  Resp: 18 20 18 19   Temp: 98 F (36.7 C) 99.1 F (37.3 C) 98.2 F (36.8 C) 98.6 F (37 C)  TempSrc:  Oral Oral Oral  SpO2: 97% 96% 100% 100%  Weight:      Height:        Intake/Output Summary (Last 24 hours) at 12/29/2022 0942 Last data filed at 12/29/2022 0900 Gross per 24 hour  Intake 480 ml  Output 1300 ml  Net -820 ml   Filed Weights   12/27/22 1408  Weight: 87.5 kg    General appearance: Awake alert.  In no distress Resp: Clear to auscultation bilaterally.  Normal effort Cardio: S1-S2 is normal regular.  No S3-S4.  No rubs murmurs or bruit GI: Abdomen is soft.  Nontender nondistended.  Bowel sounds are present normal.  No masses organomegaly Skin: Erythema involving the lower abdominal area as well.  Warm to touch.  Nontender. Extremities: Swelling of both lower extremities right greater than left.  Erythema is noted but patient mentions that looks better today compared to yesterday.  Warm to touch Neurologic:   No focal neurological deficits.    Lab Results:  Data  Reviewed: I have personally reviewed following labs and reports of the imaging studies  CBC: Recent Labs  Lab 12/27/22 1428 12/28/22 0423 12/29/22 0606  WBC 34.8* 24.1* 13.0*  NEUTROABS 33.4*  --   --   HGB 8.2* 7.7* 7.8*  HCT 26.1* 25.5* 25.6*  MCV 96.3 97.3 95.5  PLT 323 270 258    Basic Metabolic Panel: Recent Labs  Lab 12/27/22 1428 12/28/22 0423 12/29/22 0606  NA 135 134* 132*  K 4.4 4.5 4.2  CL 105 105 104  CO2 22 20* 22  GLUCOSE 91 83 74  BUN 48* 48* 47*  CREATININE 2.29* 2.25* 2.33*  CALCIUM 7.7* 7.6* 7.6*  GFR: Estimated Creatinine Clearance: 22 mL/min (A) (by C-G formula based on SCr of 2.33 mg/dL (H)).  Liver Function Tests: Recent Labs  Lab 12/27/22 1428  AST 45*  ALT 30  ALKPHOS 182*  BILITOT 0.9  PROT 6.5  ALBUMIN 2.8*     Anemia Panel: Recent Labs    12/28/22 0423  VITAMINB12 >7,500*    Recent Results (from the past 240 hour(s))  Blood culture (routine x 2)     Status: None (Preliminary result)   Collection Time: 12/27/22  2:28 PM   Specimen: Right Antecubital; Blood  Result Value Ref Range Status   Specimen Description   Final    RIGHT ANTECUBITAL BOTTLES DRAWN AEROBIC AND ANAEROBIC   Special Requests Blood Culture adequate volume  Final   Culture   Final    NO GROWTH 2 DAYS Performed at [facility], [address], [place], [place] [address]    Report Status PENDING  Incomplete  Blood culture (routine x 2)     Status: None (Preliminary result)   Collection Time: 12/27/22  2:35 PM   Specimen: BLOOD LEFT ARM  Result Value Ref Range Status   Specimen Description BLOOD LEFT ARM BOTTLES DRAWN AEROBIC AND ANAEROBIC  Final   Special Requests Blood Culture adequate volume  Final   Culture   Final    NO GROWTH 2 DAYS Performed at [facility], [address] [place], [place] [address]    Report Status PENDING  Incomplete  SARS Coronavirus 2 by RT PCR (hospital order, performed in [facility] hospital lab) *cepheid single result  test* Anterior Nasal Swab     Status: None   Collection Time: 12/27/22  3:51 PM   Specimen: Anterior Nasal Swab  Result Value Ref Range Status   SARS Coronavirus 2 by RT PCR NEGATIVE NEGATIVE Final    Comment: (NOTE) SARS-CoV-2 target nucleic acids are NOT DETECTED.  The SARS-CoV-2 RNA is generally detectable in upper and lower respiratory specimens during the acute phase of infection. The lowest concentration of SARS-CoV-2 viral copies this assay can detect is 250 copies / mL. A negative result does not preclude SARS-CoV-2 infection and should not be used as the sole basis for treatment or other patient management decisions.  A negative result may occur with improper specimen collection / handling, submission of specimen other than nasopharyngeal swab, presence of viral mutation(s) within the areas targeted by this assay, and inadequate number of viral copies (<250 copies / mL). A negative result must be combined with clinical observations, patient history, and epidemiological information.  Fact Sheet for Patients:   RoadLapTop.co.za  Fact Sheet for Healthcare Providers: http://kim-miller.com/  This test is not yet approved or  cleared by the [place] FDA and has been authorized for detection and/or diagnosis of SARS-CoV-2 by FDA under an Emergency Use Authorization (EUA).  This EUA will remain in effect (meaning this test can be used) for the duration of the COVID-19 declaration under Section 564(b)(1) of the Act, 21 U.S.C. section 360bbb-3(b)(1), unless the authorization is terminated or revoked sooner.  Performed at [facility], [address] [place], [place] [address]   MRSA Next Gen by PCR, Nasal     Status: None   Collection Time: 12/27/22  7:20 PM   Specimen: Nasal Mucosa; Nasal Swab  Result Value Ref Range Status   MRSA by PCR Next Gen NOT DETECTED NOT DETECTED Final    Comment: (NOTE) The GeneXpert MRSA Assay (FDA  approved for NASAL specimens only),  is one component of a comprehensive MRSA colonization surveillance program. It is not intended to diagnose MRSA infection nor to guide or monitor treatment for MRSA infections. Test performance is not FDA approved in patients less than 27 years old. Performed at [facility], [address], [place], [place] [address]       Radiology Studies: [facility] Chest [facility] 1 View  Result Date: 12/27/2022 CLINICAL DATA:  Shortness of breath EXAM: PORTABLE CHEST 1 VIEW COMPARISON:  X-ray 10/19/2022 FINDINGS: Enlarged cardiopericardial silhouette with calcified aorta. Vascular congestion. Trace edema. No pneumothorax or effusion. No consolidation. Curvature of the spine with degenerative changes. Artifact from the patient's clothing. Film is under penetrated. IMPRESSION: Enlarged heart with vascular congestion and trace edema. Electronically Signed   By: Karen Kays M.D.   On: 12/27/2022 14:56       LOS: 2 days    Rito Ehrlich  [facility] Pager on [email or web address]  12/29/2022, 9:42 AM

## 2022-12-29 NOTE — Plan of Care (Signed)
  Problem: Education: Goal: Knowledge of General Education information will improve Description Including pain rating scale, medication(s)/side effects and non-pharmacologic comfort measures Outcome: Progressing   Problem: Clinical Measurements: Goal: Ability to maintain clinical measurements within normal limits will improve Outcome: Progressing Goal: Will remain free from infection Outcome: Progressing Goal: Diagnostic test results will improve Outcome: Progressing   Problem: Nutrition: Goal: Adequate nutrition will be maintained Outcome: Progressing   Problem: Coping: Goal: Level of anxiety will decrease Outcome: Progressing   

## 2022-12-30 ENCOUNTER — Inpatient Hospital Stay (HOSPITAL_COMMUNITY): Payer: Medicare Other

## 2022-12-30 DIAGNOSIS — D649 Anemia, unspecified: Secondary | ICD-10-CM | POA: Diagnosis not present

## 2022-12-30 DIAGNOSIS — I824Y2 Acute embolism and thrombosis of unspecified deep veins of left proximal lower extremity: Secondary | ICD-10-CM | POA: Diagnosis not present

## 2022-12-30 DIAGNOSIS — L03115 Cellulitis of right lower limb: Secondary | ICD-10-CM | POA: Diagnosis not present

## 2022-12-30 MED ORDER — FUROSEMIDE 10 MG/ML IJ SOLN
40.0000 mg | Freq: Once | INTRAMUSCULAR | Status: AC
  Administered 2022-12-30: 40 mg via INTRAVENOUS
  Filled 2022-12-30: qty 4

## 2022-12-30 NOTE — TOC Progression Note (Signed)
Transition of Care [facility]) - Progression Note    Patient Details  Name: [name] MRN: [id number] Date of Birth: 28-Aug-1945  Transition of [facility]) CM/SW Contact  Elliot Gault, LCSW Phone Number: 12/30/2022, 12:01 PM  Clinical Narrative:     TOC following. Spoke with Whitney Post at [facility] today for update. Per Whitney Post, pt can return to SNF at dc.  MD anticipating dc in 2-3 days.  TOC will follow.  Expected Discharge Plan: Skilled Nursing Facility Barriers to Discharge: Continued Medical Work up  Expected Discharge Plan and Services                                               Social Determinants of Health (SDOH) Interventions SDOH Screenings   Food Insecurity: Patient Declined (12/27/2022)  Housing: Patient Declined (12/27/2022)  Transportation Needs: Patient Declined (12/27/2022)  Utilities: Not At Risk (12/27/2022)  Alcohol Screen: Low Risk  (12/02/2022)  Depression (PHQ2-9): Low Risk  (12/02/2022)  Financial Resource Strain: Low Risk  (12/02/2022)  Physical Activity: Insufficiently Active (12/02/2022)  Social Connections: Moderately Isolated (12/02/2022)  Stress: No Stress Concern Present (12/02/2022)  Tobacco Use: Low Risk  (12/27/2022)    Readmission Risk Interventions    12/28/2022    5:15 PM 10/21/2022   10:12 AM 08/30/2022    3:36 PM  Readmission Risk Prevention Plan  Transportation Screening Complete Complete Complete  PCP or Specialist Appt within 3-5 Days   Complete  Social Work Consult for Recovery Care Planning/Counseling   Complete  Palliative Care Screening   Not Applicable  Medication Review Oceanographer) Complete Complete Complete  PCP or Specialist appointment within 3-5 days of discharge Complete    HRI or Home Care Consult Complete Complete   SW Recovery Care/Counseling Consult Complete Complete   Palliative Care Screening Not Applicable Not Applicable   Skilled Nursing Facility Complete Complete

## 2022-12-30 NOTE — Progress Notes (Signed)
[facility] PROGRESS NOTE   [name] ZOX:[id number] DOB: [birth year] DOA: 12/27/2022  PCP: [name], FNP  Brief History/Interval Summary: 77 y.o. female with a history of COPD, sacral decubitus ulcer, HTN, HLD, hypothyroidism, depression/anxiety, GERD and recent admission (discharged 6/3) for UTI, C. difficile colitis, acute DVT and cellulitis who presented to the ED from [facility] on 12/27/2022 with fever, swelling, redness, and pain in the right leg. She had a low grade fever, leukocytosis to 34.8k, normal lactic acid, and clinical evidence of cellulitis.    Consultants: None  Procedures: None    Subjective/Interval History: Patient mentions that she feels better.  Legs are not hurting as much as before.  Denies any new complaints.     Assessment/Plan:  Right lower extremity cellulitis/abdominal wall cellulitis/sepsis, present on admission Patient with allergies to penicillin and sulfa.  Started on Levaquin.   There was some concern for progression of cellulitis with involvement of the lower abdominal area.  However she was nontender.  Erythema appears to be improving gradually. Continue with IV antibiotics for now. She remains afebrile.  WBC is now normal.  Lactic acid level was normal at admission.   Acute on chronic HFpEF LVEF 65-70%, G1DD.  Getting Lasix on a daily basis depending on how much edema she has.  Will give her 1 more dose today.    History of C. difficile colitis Empirically on prophylactic oral vancomycin.  Denies any diarrhea.  Continue for about 7 days after she finishes antibacterial agents.   Chronic kidney disease stage IV Creatinine close to baseline.  Avoid nephrotoxic agents.  Monitor urine output.  History of UTI:  Denies any urinary symptoms currently   Stage 3 sacral decubitus ulcer Continue wound care.  Patient mentions that the wound is much better.   History of DVT of left calf veins, popliteal vein, and SFV: by U/S  5/29.  Noted to be on Eliquis.  Will repeat Doppler studies.   Anemia of CKD Hgb near baseline, no bleeding noted.    Anxiety/depression:  - Continue celexa, remeron   Hypothyroidism : Last TSH 1.070 - Continue synthroid   Deconditioning:  - PT/OT consulted. Pt likely to return to SNF once inpatient level of care no longer required.    Obesity Estimated body mass index is 33.13 kg/m as calculated from the following:   Height as of this encounter: 5\' 4"  (1.626 m).   Weight as of this encounter: 87.5 kg.   DVT Prophylaxis: On apixaban Code Status: Full code Family Communication: No family at bedside.  Discussed with patient Disposition Plan: SNF when ready for discharge     Medications: Scheduled:  apixaban  5 mg Oral BID   citalopram  40 mg Oral Daily   furosemide  40 mg Intravenous Once   levothyroxine  75 mcg Oral QAC breakfast   liver oil-zinc oxide   Topical BID   mirtazapine  7.5 mg Oral QHS   vancomycin  125 mg Oral QID   Continuous:  levofloxacin (LEVAQUIN) IV 500 mg (12/29/22 1558)   WJX:[id number] **OR** acetaminophen, albuterol, hydrALAZINE, HYDROcodone-acetaminophen, ondansetron **OR** ondansetron (ZOFRAN) IV, polyethylene glycol  Antibiotics: Anti-infectives (From admission, onward)    Start     Dose/Rate Route Frequency Ordered Stop   12/29/22 1600  levofloxacin (LEVAQUIN) IVPB 500 mg        500 mg 100 mL/hr over 60 Minutes Intravenous Every 48 hours 12/27/22 2234     12/28/22 1800  vancomycin (VANCOCIN) IVPB 1000 mg/200 mL  premix  Status:  Discontinued        1,000 mg 200 mL/hr over 60 Minutes Intravenous Every 24 hours 12/27/22 1558 12/28/22 1221   12/28/22 1130  vancomycin (VANCOCIN) capsule 125 mg        125 mg Oral 4 times daily 12/28/22 1038     12/27/22 1800  vancomycin (VANCOCIN) capsule 125 mg  Status:  Discontinued        125 mg Oral 4 times daily 12/27/22 1735 12/28/22 1038   12/27/22 1600  vancomycin (VANCOCIN) IVPB 1000 mg/200 mL  premix  Status:  Discontinued        1,000 mg 200 mL/hr over 60 Minutes Intravenous  Once 12/27/22 1549 12/27/22 1557   12/27/22 1600  levofloxacin (LEVAQUIN) IVPB 750 mg        750 mg 100 mL/hr over 90 Minutes Intravenous  Once 12/27/22 1549 12/28/22 0740   12/27/22 1600  vancomycin (VANCOREADY) IVPB 1750 mg/350 mL        1,750 mg 175 mL/hr over 120 Minutes Intravenous  Once 12/27/22 1558 12/28/22 0740   12/27/22 1600  levofloxacin (LEVAQUIN) IVPB 500 mg  Status:  Discontinued        500 mg 100 mL/hr over 60 Minutes Intravenous Every 48 hours 12/27/22 1558 12/27/22 2234       Objective:  Vital Signs  Vitals:   12/29/22 0303 12/29/22 1500 12/29/22 2056 12/30/22 0310  BP: 122/64 126/65 128/74 118/65  Pulse: (!) 51 (!) 58 (!) 56 (!) 53  Resp: 19 18 20 19   Temp: 98.6 F (37 C) 98.2 F (36.8 C) 98 F (36.7 C) 98.1 F (36.7 C)  TempSrc: Oral Oral Oral Oral  SpO2: 100% 100% 99% 95%  Weight:      Height:        Intake/Output Summary (Last 24 hours) at 12/30/2022 1054 Last data filed at 12/30/2022 0956 Gross per 24 hour  Intake 1140 ml  Output 1150 ml  Net -10 ml   Filed Weights   12/27/22 1408  Weight: 87.5 kg    General appearance: Awake alert.  In no distress Resp: Clear to auscultation bilaterally.  Normal effort Cardio: S1-S2 is normal regular.  No S3-S4.  No rubs murmurs or bruit GI: Abdomen is soft.  Nontender nondistended.  Bowel sounds are present normal.  No masses organomegaly Extremities: Noted to have edema both lower extremities right greater than left.  About the same as yesterday.  Erythema appears to be improving.  Erythema over the lower abdominal wall area is also improving.    Lab Results:  Data Reviewed: I have personally reviewed following labs and reports of the imaging studies  CBC: Recent Labs  Lab 12/27/22 1428 12/28/22 0423 12/29/22 0606 12/30/22 0451  WBC 34.8* 24.1* 13.0* 9.7  NEUTROABS 33.4*  --   --   --   HGB 8.2* 7.7* 7.8* 8.1*   HCT 26.1* 25.5* 25.6* 25.5*  MCV 96.3 97.3 95.5 94.4  PLT 323 270 258 313    Basic Metabolic Panel: Recent Labs  Lab 12/27/22 1428 12/28/22 0423 12/29/22 0606 12/30/22 0451  NA 135 134* 132* 135  K 4.4 4.5 4.2 4.0  CL 105 105 104 105  CO2 22 20* 22 22  GLUCOSE 91 83 74 78  BUN 48* 48* 47* 46*  CREATININE 2.29* 2.25* 2.33* 2.38*  CALCIUM 7.7* 7.6* 7.6* 7.9*    GFR: Estimated Creatinine Clearance: 21.5 mL/min (A) (by C-G formula based on SCr of 2.38 mg/dL (  H)).  Liver Function Tests: Recent Labs  Lab 12/27/22 1428  AST 45*  ALT 30  ALKPHOS 182*  BILITOT 0.9  PROT 6.5  ALBUMIN 2.8*     Anemia Panel: Recent Labs    12/28/22 0423  VITAMINB12 >7,500*    Recent Results (from the past 240 hour(s))  Blood culture (routine x 2)     Status: None (Preliminary result)   Collection Time: 12/27/22  2:28 PM   Specimen: Right Antecubital; Blood  Result Value Ref Range Status   Specimen Description   Final    RIGHT ANTECUBITAL BOTTLES DRAWN AEROBIC AND ANAEROBIC   Special Requests Blood Culture adequate volume  Final   Culture   Final    NO GROWTH 3 DAYS Performed at [facility], [address], [place], [place] [address]    Report Status PENDING  Incomplete  Blood culture (routine x 2)     Status: None (Preliminary result)   Collection Time: 12/27/22  2:35 PM   Specimen: BLOOD LEFT ARM  Result Value Ref Range Status   Specimen Description BLOOD LEFT ARM BOTTLES DRAWN AEROBIC AND ANAEROBIC  Final   Special Requests Blood Culture adequate volume  Final   Culture   Final    NO GROWTH 3 DAYS Performed at [facility], [address], [place], [place] [address]    Report Status PENDING  Incomplete  SARS Coronavirus 2 by RT PCR (hospital order, performed in [facility] hospital lab) *cepheid single result test* Anterior Nasal Swab     Status: None   Collection Time: 12/27/22  3:51 PM   Specimen: Anterior Nasal Swab  Result Value Ref Range Status   SARS Coronavirus 2  by RT PCR NEGATIVE NEGATIVE Final    Comment: (NOTE) SARS-CoV-2 target nucleic acids are NOT DETECTED.  The SARS-CoV-2 RNA is generally detectable in upper and lower respiratory specimens during the acute phase of infection. The lowest concentration of SARS-CoV-2 viral copies this assay can detect is 250 copies / mL. A negative result does not preclude SARS-CoV-2 infection and should not be used as the sole basis for treatment or other patient management decisions.  A negative result may occur with improper specimen collection / handling, submission of specimen other than nasopharyngeal swab, presence of viral mutation(s) within the areas targeted by this assay, and inadequate number of viral copies (<250 copies / mL). A negative result must be combined with clinical observations, patient history, and epidemiological information.  Fact Sheet for Patients:   RoadLapTop.co.za  Fact Sheet for Healthcare Providers: http://kim-miller.com/  This test is not yet approved or  cleared by the [place] FDA and has been authorized for detection and/or diagnosis of SARS-CoV-2 by FDA under an Emergency Use Authorization (EUA).  This EUA will remain in effect (meaning this test can be used) for the duration of the COVID-19 declaration under Section 564(b)(1) of the Act, 21 U.S.C. section 360bbb-3(b)(1), unless the authorization is terminated or revoked sooner.  Performed at [facility], [address], [place], [place] [address]   MRSA Next Gen by PCR, Nasal     Status: None   Collection Time: 12/27/22  7:20 PM   Specimen: Nasal Mucosa; Nasal Swab  Result Value Ref Range Status   MRSA by PCR Next Gen NOT DETECTED NOT DETECTED Final    Comment: (NOTE) The GeneXpert MRSA Assay (FDA approved for NASAL specimens only), is one component of a comprehensive MRSA colonization surveillance program. It is not intended to diagnose MRSA  infection nor to  guide or monitor treatment for MRSA infections. Test performance is not FDA approved in patients less than 38 years old. Performed at [facility], [address], [place], [place] [address]       Radiology Studies: No results found.     LOS: 3 days    [employer] on [email or web address]  12/30/2022, 10:54 AM

## 2022-12-30 NOTE — Progress Notes (Signed)
Pt assisted up to [facility] by staff member x1 for BM. Pt with moderate soft brown BM, pericare provided and pt then ambulated 4 steps to recliner. Pt requires FWW and 1 person assist when ambulating due to difficulty moving legs due to swelling and pain. Pt tolerated well, awaiting [place] of bilateral lower legs. Pt denies any c/o or other needs at this time. Chair alarm on for safety, call bell within reach.

## 2022-12-30 NOTE — Plan of Care (Signed)
  Problem: Acute Rehab OT Goals (only OT should resolve) Goal: Pt. Will Perform Grooming Flowsheets (Taken 12/30/2022 1646) Pt Will Perform Grooming:  with modified independence  sitting Goal: Pt. Will Perform Lower Body Bathing Flowsheets (Taken 12/30/2022 1646) Pt Will Perform Lower Body Bathing:  with modified independence  sitting/lateral leans  with adaptive equipment Goal: Pt. Will Perform Lower Body Dressing Flowsheets (Taken 12/30/2022 1646) Pt Will Perform Lower Body Dressing:  with modified independence  with adaptive equipment  sitting/lateral leans Goal: Pt. Will Transfer To Toilet Flowsheets (Taken 12/30/2022 1646) Pt Will Transfer to Toilet:  with modified independence  ambulating Goal: Pt. Will Perform Toileting-Clothing Manipulation Flowsheets (Taken 12/30/2022 1646) Pt Will Perform Toileting - Clothing Manipulation and hygiene:  with modified independence  sitting/lateral leans Goal: Pt/Caregiver Will Perform Home Exercise Program Flowsheets (Taken 12/30/2022 1646) Pt/caregiver will Perform Home Exercise Program:  Increased ROM  Increased strength  Both right and left upper extremity  Independently    OT, MOT

## 2022-12-30 NOTE — Progress Notes (Signed)
Mobility Specialist Progress Note:    12/30/22 1235  Mobility  Activity Ambulated with assistance in room  Level of Assistance Moderate assist, patient does 50-74%  Assistive Device Front wheel walker  Distance Ambulated (ft) 15 ft  Range of Motion/Exercises Active;All extremities  Activity Response Tolerated well  Mobility Referral Yes  $Mobility charge 1 Mobility  Mobility Specialist Start Time (ACUTE ONLY) 1235  Mobility Specialist Stop Time (ACUTE ONLY) 1245  Mobility Specialist Time Calculation (min) (ACUTE ONLY) 10 min   Pt was received in chair, agreeable to mobility. Required ModA to stand and ambulate with RW. Tolerated well, asx throughout. Returned pt to chair, call bell in reach. All needs met.   Lawerance Bach Mobility Specialist Please contact via Special educational needs teacher or  Rehab office at [phone]

## 2022-12-30 NOTE — Evaluation (Signed)
Occupational Therapy Evaluation Patient Details Name: Shameca Norgren MRN: [id number] DOB: 02-05-1946 Today's Date: 12/30/2022   History of Present Illness Merita Aminov  is a 77 y.o. female, with medical history significant of anxiety, depression, COPD, GERD, hyperlipidemia, hypertension, hypothyroidism, decubitus ulcer, who was discharged last June from hospital due to C. difficile colitis, Klebsiella UTI, acute DVT and cellulitis .  -Patient presents from [facility] due to fever, and leg pain, patient reports fever, some dyspnea, increased pain and swelling in the right lower extremity, facility were concerned about cellulitis, so she was sent to ED for further evaluation, she denies any diarrhea, chest pain, she does endorse some nausea, vomiting x 1, there is report of history of dementia, but patient is awake alert x 4 and appropriate.  -In ED she was febrile 100.1, leukocytosis of 34 K, creatinine around baseline of 2.29, she has known anemia with hemoglobin of 8.2, lactic acid within normal limit, UA still pending, but she denies any urinary symptoms, her exam significant for right lower extremity cellulitis, she was treated for sepsis in ED and [facility] hospitalist consulted to admit. (per MD)   Clinical Impression   Pt was agreeable to OT evaluation today. Pt reports mostly independence with ADL's at the SNF which she came from. Pt has limited shoulder A/ROM at baseline and general weakness otherwise. Pt was able to remove socks at EOB but required assist to start sock on B LE. Pt was able to transfer to the chair with min A to boost from the EOB followed by more contact guard assist to the chair. Pt was left in the chair with call bell within reach. Pt will benefit from continued OT in the hospital and recommended venue below to increase strength, balance, and endurance for safe ADL's.         Recommendations for follow up therapy are one component of a multi-disciplinary discharge planning  process, led by the attending physician.  Recommendations may be updated based on patient status, additional functional criteria and insurance authorization.   Assistance Recommended at Discharge Intermittent Supervision/Assistance  Patient can return home with the following A little help with walking and/or transfers;A lot of help with bathing/dressing/bathroom;Assistance with cooking/housework;Assist for transportation;Help with stairs or ramp for entrance    Functional Status Assessment  Patient has had a recent decline in their functional status and demonstrates the ability to make significant improvements in function in a reasonable and predictable amount of time.  Equipment Recommendations  None recommended by OT    Recommendations for Other Services       Precautions / Restrictions Precautions Precautions: Fall Restrictions Weight Bearing Restrictions: No      Mobility Bed Mobility Overal bed mobility: Needs Assistance Bed Mobility: Supine to Sit     Supine to sit: Min guard, HOB elevated     General bed mobility comments: Slow labored effort with HOB elevated.    Transfers Overall transfer level: Needs assistance Equipment used: Rolling walker (2 wheels) Transfers: Sit to/from Stand, Bed to chair/wheelchair/BSC Sit to Stand: Min assist     Step pivot transfers: Min guard, Min assist     General transfer comment: Assist to keep RW steady while pt stood from EOB. Labored effort for steps to chair.      Balance Overall balance assessment: Needs assistance Sitting-balance support: Feet supported, No upper extremity supported Sitting balance-Leahy Scale: Good Sitting balance - Comments: good sitting balance on edge of bed   Standing balance support: Bilateral upper extremity supported,  Reliant on assistive device for balance, During functional activity Standing balance-Leahy Scale: Fair Standing balance comment: using RW                            ADL either performed or assessed with clinical judgement   ADL Overall ADL's : Needs assistance/impaired     Grooming: Set up;Sitting   Upper Body Bathing: Min guard;Minimal assistance;Sitting;Set up   Lower Body Bathing: Minimal assistance;Sitting/lateral leans;Moderate assistance   Upper Body Dressing : Set up;Min guard;Minimal assistance   Lower Body Dressing: Minimal assistance;Moderate assistance;Sitting/lateral leans   Toilet Transfer: Min guard;Minimal assistance;Rolling walker (2 wheels);Stand-pivot Statistician Details (indicate cue type and reason): Simulated via EOB to chair transfer. Toileting- Clothing Manipulation and Hygiene: Minimal assistance;Sitting/lateral lean;Moderate assistance       Functional mobility during ADLs: Minimal assistance;Rolling walker (2 wheels)       Vision Baseline Vision/History: 1 Wears glasses Ability to See in Adequate Light: 1 Impaired Patient Visual Report: No change from baseline Vision Assessment?: No apparent visual deficits     Perception Perception Perception Tested?: No   Praxis Praxis Praxis tested?: Not tested    Pertinent Vitals/Pain Pain Assessment Pain Assessment: Faces Faces Pain Scale: Hurts little more Pain Location: R leg Pain Descriptors / Indicators: Other (Comment) (old/chronic pain) Pain Intervention(s): Limited activity within patient's tolerance, Monitored during session, Repositioned     Hand Dominance Right   Extremity/Trunk Assessment Upper Extremity Assessment Upper Extremity Assessment: Generalized weakness (B UE shoulder flexion limited to 3-/5, which pt reports as a baseline issue. Generalized weakness outside of shoulder issues.)   Lower Extremity Assessment Lower Extremity Assessment: Defer to PT evaluation   Cervical / Trunk Assessment Cervical / Trunk Assessment: Kyphotic   Communication Communication Communication: HOH   Cognition Arousal/Alertness: Awake/alert Behavior  During Therapy: WFL for tasks assessed/performed Overall Cognitive Status: Within Functional Limits for tasks assessed                                                        Home Living Family/patient expects to be discharged to:: Skilled nursing facility                                 Additional Comments: [facility]cal      Prior Functioning/Environment Prior Level of Function : Needs assist           ADLs (physical): IADLs Mobility Comments: Household ambulator using RW ADLs Comments: Reports independence for ADL's other than supervision for bathing.        OT Problem List: Decreased strength;Decreased range of motion;Decreased activity tolerance;Impaired balance (sitting and/or standing);Obesity;Increased edema      OT Treatment/Interventions: Self-care/ADL training;Therapeutic exercise;Therapeutic activities;Patient/family education;Balance training    OT Goals(Current goals can be found in the care plan section) Acute Rehab OT Goals Patient Stated Goal: improve function OT Goal Formulation: With patient Time For Goal Achievement: 01/13/23 Potential to Achieve Goals: Good  OT Frequency: Min 2X/week    Co-evaluation                               End of Session Equipment Utilized During Treatment: Rolling walker (2  wheels) Nurse Communication: Other (comment) (Notified pt was in the chair at that she was concerend about increased redness in her R LE.)  Activity Tolerance: Patient tolerated treatment well Patient left: in chair;with call bell/phone within reach;with chair alarm set  OT Visit Diagnosis: Unsteadiness on feet (R26.81);Other abnormalities of gait and mobility (R26.89);Muscle weakness (generalized) (M62.81)                Time: 2440-1027 OT Time Calculation (min): 16 min Charges:  OT General Charges $OT Visit: 1 Visit OT Evaluation $OT Eval Low Complexity: 1 Low    OT,  MOT  Danie Chandler 12/30/2022, 4:41 PM

## 2022-12-30 NOTE — [place] FL2 (Signed)
MEDICAID [id number] LEVEL OF CARE FORM     IDENTIFICATION  Patient Name: [name] Birthdate: 1945-11-07 Sex: female Admission Date (Current Location): 12/27/2022  [facility] and [place] Number:  [employer] and Address:  [facility],  [address] [place] [address]      Provider Number: [id number]  Attending Physician Name and Address:  Osvaldo Shipper, MD  Relative Name and Phone Number:       Current Level of Care: Hospital Recommended Level of Care: Skilled Nursing Facility Prior Approval Number:    Date Approved/Denied:   PASRR Number:    Discharge Plan: SNF    Current Diagnoses: Patient Active Problem List   Diagnosis Date Noted   Sepsis (HCC) 12/27/2022   Left Leg Extensive DVT (deep venous thrombosis) (HCC) 10/24/2022   UTI (urinary tract infection) 10/21/2022   Thyroid disease 10/20/2022   C. difficile colitis 10/20/2022   Pulmonary vascular congestion 10/20/2022   AKI (acute kidney injury) (HCC) 10/19/2022   Dysphagia 10/07/2022   Obesity 10/07/2022   Dementia with behavioral disturbance (HCC) 09/30/2022   Hypoglycemia 09/26/2022   Frequent falls 08/30/2022   Fall at home, initial encounter 08/29/2022   Low back pain 08/29/2022   Failure to thrive in adult 08/29/2022   Physical deconditioning 08/29/2022   Elevated CK 08/29/2022   Decubitus ulcer 08/29/2022   GERD (gastroesophageal reflux disease) 08/29/2022   Restless leg syndrome 08/29/2022   Rhabdomyolysis 07/07/2022   Dehydration 07/07/2022   Transaminitis 07/07/2022   Hypotension 07/07/2022   SIRS (systemic inflammatory response syndrome) (HCC) 07/07/2022   Hypokalemia 07/07/2022   Stage 3b chronic kidney disease (CKD) (HCC) 07/07/2022   Hypothermia 07/07/2022   Acute metabolic encephalopathy 07/06/2022   BMI 26.0-26.9,adult 05/06/2022   Atrial fibrillation (HCC) 04/12/2022   Congenital heart failure (HCC) 04/12/2022   Malnutrition of moderate degree 01/14/2022    Respiratory failure requiring intubation (HCC) 01/11/2022   Septic shock (HCC) 01/10/2022   Pressure injury of skin 01/10/2022   Small bowel obstruction (HCC) 01/07/2022   Abnormal weight loss 11/28/2021   Family history of malignant neoplasm of digestive organs 11/28/2021   Intestinal malabsorption 11/28/2021   Renal insufficiency 12/08/2020   Degeneration of lumbar intervertebral disc 01/07/2020   Venous (peripheral) insufficiency 09/30/2019   Anemia 08/12/2017   Vitamin D deficiency 08/12/2017   Lumbar spondylosis with myelopathy 06/03/2017   Primary osteoarthritis of both knees 03/19/2017   Anxiety 02/17/2017   History of pulmonary embolus (PE) 07/09/2016   COPD (chronic obstructive pulmonary disease) (HCC) 01/18/2016   Mixed hyperlipidemia 01/18/2016   Depression 01/18/2016   Essential hypertension 01/18/2016   H/O spinal fusion 01/18/2016   Acquired hypothyroidism 01/18/2016   Thoracic aorta atherosclerosis (HCC) 01/18/2016   Chronic cholecystitis with calculus 10/04/2013    Orientation RESPIRATION BLADDER Height & Weight     Self, Time, Situation, Place  O2 (see dc summary) Incontinent Weight: 193 lb (87.5 kg) Height:  5\' 4"  (162.6 cm)  BEHAVIORAL SYMPTOMS/MOOD NEUROLOGICAL BOWEL NUTRITION STATUS      Incontinent Diet (see dc summary)  AMBULATORY STATUS COMMUNICATION OF NEEDS Skin   Extensive Assist Verbally  (sacrum)       PU Stage 4 Dressing: Daily               Personal Care Assistance Level of Assistance  Bathing, Feeding, Dressing Bathing Assistance: Maximum assistance Feeding assistance: Independent Dressing Assistance: Maximum assistance     Functional Limitations Info  Sight, Hearing, Speech Sight Info:  Adequate Hearing Info: Impaired Speech Info: Adequate    SPECIAL CARE FACTORS FREQUENCY                       Contractures Contractures Info: Not present    Additional Factors Info  Code Status, Allergies Code Status Info:  Full Allergies Info: Celebrex (Celecoxib), Keflex (Cephalexin), Penicillins, Sulfa Antibiotics, Kenalog (Triamcinolone Acetonide), Latex, Mobic (Meloxicam), Zestril (Lisinopril)           Current Medications (12/30/2022):  This is the current hospital active medication list Current Facility-Administered Medications  Medication Dose Route Frequency Provider Last Rate Last Admin   acetaminophen (TYLENOL) tablet 325 mg  325 mg Oral Q6H PRN Elgergawy, Leana Roe, MD       Or   acetaminophen (TYLENOL) suppository 325 mg  325 mg Rectal Q6H PRN Elgergawy, Leana Roe, MD       albuterol (PROVENTIL) (2.5 MG/3ML) 0.083% nebulizer solution 2.5 mg  2.5 mg Nebulization Q2H PRN Elgergawy, Leana Roe, MD       apixaban (ELIQUIS) tablet 5 mg  5 mg Oral BID Elgergawy, Leana Roe, MD   5 mg at 12/30/22 1610   citalopram (CELEXA) tablet 40 mg  40 mg Oral Daily Elgergawy, Leana Roe, MD   40 mg at 12/30/22 0820   furosemide (LASIX) injection 40 mg  40 mg Intravenous Once Osvaldo Shipper, MD       hydrALAZINE (APRESOLINE) injection 5 mg  5 mg Intravenous Q4H PRN Elgergawy, Leana Roe, MD       HYDROcodone-acetaminophen (NORCO/VICODIN) 5-325 MG per tablet 1-2 tablet  1-2 tablet Oral Q4H PRN Elgergawy, Leana Roe, MD   2 tablet at 12/30/22 0546   levofloxacin (LEVAQUIN) IVPB 500 mg  500 mg Intravenous Q48H Elgergawy, Leana Roe, MD 100 mL/hr at 12/29/22 1558 500 mg at 12/29/22 1558   levothyroxine (SYNTHROID) tablet 75 mcg  75 mcg Oral QAC breakfast Elgergawy, Leana Roe, MD   75 mcg at 12/30/22 0545   liver oil-zinc oxide (DESITIN) 40 % ointment   Topical BID Tyrone Nine, MD   Given at 12/29/22 2144   mirtazapine (REMERON) tablet 7.5 mg  7.5 mg Oral QHS Elgergawy, Leana Roe, MD   7.5 mg at 12/29/22 2143   ondansetron (ZOFRAN) tablet 4 mg  4 mg Oral Q6H PRN Elgergawy, Leana Roe, MD       Or   ondansetron (ZOFRAN) injection 4 mg  4 mg Intravenous Q6H PRN Elgergawy, Leana Roe, MD       polyethylene glycol (MIRALAX / GLYCOLAX) packet 17  g  17 g Oral Daily PRN Elgergawy, Leana Roe, MD       vancomycin (VANCOCIN) capsule 125 mg  125 mg Oral QID Tyrone Nine, MD   125 mg at 12/30/22 9604     Discharge Medications: Please see discharge summary for a list of discharge medications.  Relevant Imaging Results:  Relevant Lab Results:   Additional Information    Elliot Gault, LCSW

## 2022-12-31 DIAGNOSIS — L03115 Cellulitis of right lower limb: Secondary | ICD-10-CM | POA: Diagnosis not present

## 2022-12-31 DIAGNOSIS — D649 Anemia, unspecified: Secondary | ICD-10-CM | POA: Diagnosis not present

## 2022-12-31 MED ORDER — LEVOFLOXACIN 500 MG PO TABS
500.0000 mg | ORAL_TABLET | ORAL | Status: DC
  Administered 2022-12-31: 500 mg via ORAL
  Filled 2022-12-31 (×2): qty 1

## 2022-12-31 NOTE — Progress Notes (Signed)
Physical Therapy Treatment Patient Details Name: [name] MRN: [id number] DOB: 04/28/1946 Today's Date: 12/31/2022   History of Present Illness [name]  is a 77 y.o. female, with medical history significant of anxiety, depression, COPD, GERD, hyperlipidemia, hypertension, hypothyroidism, decubitus ulcer, who was discharged last June from hospital due to C. difficile colitis, Klebsiella UTI, acute DVT and cellulitis .  -Patient presents from [facility] due to fever, and leg pain, patient reports fever, some dyspnea, increased pain and swelling in the right lower extremity, facility were concerned about cellulitis, so she was sent to ED for further evaluation, she denies any diarrhea, chest pain, she does endorse some nausea, vomiting x 1, there is report of history of dementia, but patient is awake alert x 4 and appropriate.  -In ED she was febrile 100.1, leukocytosis of 34 K, creatinine around baseline of 2.29, she has known anemia with hemoglobin of 8.2, lactic acid within normal limit, UA still pending, but she denies any urinary symptoms, her exam significant for right lower extremity cellulitis, she was treated for sepsis in ED and [facility] hospitalist consulted to admit.    PT Comments  Patient presents up in chair (assisted by nursing staff) and agreeable for therapy.  Patient demonstrates increased endurance/distance for gait training with slow labored cadence, no loss of balance, but limited mostly due to c/o fatigue and increasing bilateral heel pain. Patient tolerated staying up in chair after therapy.  Patient will benefit from continued skilled physical therapy in hospital and recommended venue below to increase strength, balance, endurance for safe ADLs and gait.      If plan is discharge home, recommend the following: A lot of help with bathing/dressing/bathroom;A lot of help with walking and/or transfers;Assistance with cooking/housework;Help with stairs or ramp for entrance   Can  travel by private vehicle     Yes  Equipment Recommendations  None recommended by PT    Recommendations for Other Services       Precautions / Restrictions Precautions Precautions: Fall Restrictions Weight Bearing Restrictions: No     Mobility  Bed Mobility               General bed mobility comments: Patient presents seated in chair (assisted by nursing staff)    Transfers Overall transfer level: Needs assistance Equipment used: Rolling walker (2 wheels) Transfers: Sit to/from Stand Sit to Stand: Min assist   Step pivot transfers: Min assist       General transfer comment: increased time, labored movement    Ambulation/Gait Ambulation/Gait assistance: Min assist, Mod assist Gait Distance (Feet): 25 Feet Assistive device: Rolling walker (2 wheels) Gait Pattern/deviations: Decreased step length - right, Decreased step length - left, Decreased stride length Gait velocity: decreased     General Gait Details: increased endurance/distance for taking steps with slow labored cadence without loss of balance, limited mostly due to c/o fatigue and bilateral heel pain   Stairs             Wheelchair Mobility     Tilt Bed    Modified Rankin (Stroke Patients Only)       Balance Overall balance assessment: Needs assistance Sitting-balance support: Feet supported, No upper extremity supported Sitting balance-Leahy Scale: Good Sitting balance - Comments: seated in chair   Standing balance support: Reliant on assistive device for balance, During functional activity, Bilateral upper extremity supported Standing balance-Leahy Scale: Fair Standing balance comment: using RW  Cognition Arousal/Alertness: Awake/alert Behavior During Therapy: WFL for tasks assessed/performed Overall Cognitive Status: Within Functional Limits for tasks assessed                                          Exercises  General Exercises - Lower Extremity Long Arc Quad: Seated, Both, 10 reps, AROM Hip Flexion/Marching: Seated, AROM, Strengthening, Both, 10 reps Toe Raises: Seated, AROM, Strengthening, Both, 10 reps Heel Raises: Seated, AROM, Strengthening, Both, 10 reps    General Comments        Pertinent Vitals/Pain Pain Assessment Pain Assessment: Faces Faces Pain Scale: Hurts a little bit Pain Location: bilateral heels when standing Pain Descriptors / Indicators: Sore Pain Intervention(s): Limited activity within patient's tolerance, Monitored during session, Repositioned    Home Living                          Prior Function            PT Goals (current goals can now be found in the care plan section) Acute Rehab PT Goals Patient Stated Goal: return to [facility] PT Goal Formulation: With patient Time For Goal Achievement: 01/11/23 Potential to Achieve Goals: Good Progress towards PT goals: Progressing toward goals    Frequency    Min 3X/week      PT Plan Current plan remains appropriate    Co-evaluation              AM-PAC PT "6 Clicks" Mobility   Outcome Measure  Help needed turning from your back to your side while in a flat bed without using bedrails?: A Little Help needed moving from lying on your back to sitting on the side of a flat bed without using bedrails?: A Lot Help needed moving to and from a bed to a chair (including a wheelchair)?: A Little Help needed standing up from a chair using your arms (e.g., wheelchair or bedside chair)?: A Little Help needed to walk in hospital room?: A Lot Help needed climbing 3-5 steps with a railing? : A Lot 6 Click Score: 15    End of Session   Activity Tolerance: Patient tolerated treatment well;Patient limited by fatigue Patient left: in chair;with call bell/phone within reach Nurse Communication: Mobility status PT Visit Diagnosis: Other abnormalities of gait and mobility (R26.89);Unsteadiness on feet  (R26.81);Muscle weakness (generalized) (M62.81);Pain;Difficulty in walking, not elsewhere classified (R26.2)     Time: 5621-3086 PT Time Calculation (min) (ACUTE ONLY): 21 min  Charges:    $Gait Training: 8-22 mins $Therapeutic Exercise: 8-22 mins PT General Charges $$ ACUTE PT VISIT: 1 Visit                     3:47 PM, 12/31/22 Ocie Bob, MPT Physical Therapist with [facility] [phone] office [phone] mobile phone

## 2022-12-31 NOTE — Progress Notes (Signed)
[facility] PROGRESS NOTE   [name] ZOX:[id number] DOB: August 20, 1945 DOA: 12/27/2022  PCP: [name], FNP  Brief History/Interval Summary: 77 y.o. female with a history of COPD, sacral decubitus ulcer, HTN, HLD, hypothyroidism, depression/anxiety, GERD and recent admission (discharged 6/3) for UTI, C. difficile colitis, acute DVT and cellulitis who presented to the ED from [facility] on 12/27/2022 with fever, swelling, redness, and pain in the right leg. She had a low grade fever, leukocytosis to 34.8k, normal lactic acid, and clinical evidence of cellulitis.    Consultants: None  Procedures: None    Subjective/Interval History: Patient mentions that she is feeling well.  The redness in her right leg as well as her abdominal area has significantly improved.  No nausea vomiting.   Assessment/Plan:  Right lower extremity cellulitis/abdominal wall cellulitis/sepsis, present on admission Patient with allergies to penicillin and sulfa.  Started on Levaquin.   There was some concern for progression of cellulitis with involvement of the lower abdominal area.  However she was nontender.  Lower extremity Doppler study is negative for DVT. Erythema is significantly improved.  Will change her over to oral antibiotics. Remains afebrile.  WBC was normal yesterday.  Lactic acid level was normal at admission.   Acute on chronic HFpEF LVEF 65-70%, G1DD.  Getting Lasix on a daily basis depending on how much edema she has.  Will give her additional dose of IV Lasix today.  Will likely need a few days of oral furosemide at discharge.  Renal function has been holding steady.   History of C. difficile colitis Empirically on prophylactic oral vancomycin.  Denies any diarrhea.  Continue for about 7 days after she finishes antibacterial agents.   Chronic kidney disease stage IV Creatinine close to baseline.  Avoid nephrotoxic agents.  Monitor urine output.  History of UTI:   Denies any urinary symptoms currently   Stage 3 sacral decubitus ulcer Continue wound care.  Patient mentions that the wound is much better.   History of DVT of left calf veins, popliteal vein, and SFV: by U/S 5/29.  Noted to be on Eliquis.  Doppler studies were repeated and were negative for DVT.  Continue Eliquis for 3 months from 5/29.   Anemia of CKD Hgb near baseline, no bleeding noted.    Anxiety/depression:  - Continue celexa, remeron   Hypothyroidism : Last TSH 1.070 - Continue synthroid   Deconditioning:  - PT/OT consulted. Pt likely to return to SNF once inpatient level of care no longer required.    Obesity Estimated body mass index is 33.13 kg/m as calculated from the following:   Height as of this encounter: 5\' 4"  (1.626 m).   Weight as of this encounter: 87.5 kg.   DVT Prophylaxis: On apixaban Code Status: Full code Family Communication: No family at bedside.  Discussed with patient Disposition Plan: SNF when ready for discharge     Medications: Scheduled:  apixaban  5 mg Oral BID   citalopram  40 mg Oral Daily   levothyroxine  75 mcg Oral QAC breakfast   liver oil-zinc oxide   Topical BID   mirtazapine  7.5 mg Oral QHS   vancomycin  125 mg Oral QID   Continuous:  levofloxacin (LEVAQUIN) IV 500 mg (12/29/22 1558)   WJX:BJYNWGNFAOZHY **OR** acetaminophen, albuterol, hydrALAZINE, HYDROcodone-acetaminophen, ondansetron **OR** ondansetron (ZOFRAN) IV, polyethylene glycol  Antibiotics: Anti-infectives (From admission, onward)    Start     Dose/Rate Route Frequency Ordered Stop   12/29/22 1600  levofloxacin (  LEVAQUIN) IVPB 500 mg        500 mg 100 mL/hr over 60 Minutes Intravenous Every 48 hours 12/27/22 2234     12/28/22 1800  vancomycin (VANCOCIN) IVPB 1000 mg/200 mL premix  Status:  Discontinued        1,000 mg 200 mL/hr over 60 Minutes Intravenous Every 24 hours 12/27/22 1558 12/28/22 1221   12/28/22 1130  vancomycin (VANCOCIN) capsule 125 mg         125 mg Oral 4 times daily 12/28/22 1038     12/27/22 1800  vancomycin (VANCOCIN) capsule 125 mg  Status:  Discontinued        125 mg Oral 4 times daily 12/27/22 1735 12/28/22 1038   12/27/22 1600  vancomycin (VANCOCIN) IVPB 1000 mg/200 mL premix  Status:  Discontinued        1,000 mg 200 mL/hr over 60 Minutes Intravenous  Once 12/27/22 1549 12/27/22 1557   12/27/22 1600  levofloxacin (LEVAQUIN) IVPB 750 mg        750 mg 100 mL/hr over 90 Minutes Intravenous  Once 12/27/22 1549 12/28/22 0740   12/27/22 1600  vancomycin (VANCOREADY) IVPB 1750 mg/350 mL        1,750 mg 175 mL/hr over 120 Minutes Intravenous  Once 12/27/22 1558 12/28/22 0740   12/27/22 1600  levofloxacin (LEVAQUIN) IVPB 500 mg  Status:  Discontinued        500 mg 100 mL/hr over 60 Minutes Intravenous Every 48 hours 12/27/22 1558 12/27/22 2234       Objective:  Vital Signs  Vitals:   12/30/22 1453 12/30/22 2201 12/31/22 0552 12/31/22 0841  BP: 122/71 115/65 (!) 114/59 (!) 129/49  Pulse: (!) 52 (!) 56 (!) 56 (!) 59  Resp: 18 20 18 18   Temp: 98.2 F (36.8 C) 98.6 F (37 C) 98.4 F (36.9 C) 98.3 F (36.8 C)  TempSrc:   Oral Oral  SpO2: 98% 96% 99% 95%  Weight:      Height:        Intake/Output Summary (Last 24 hours) at 12/31/2022 0941 Last data filed at 12/31/2022 0551 Gross per 24 hour  Intake 600 ml  Output 2700 ml  Net -2100 ml   Filed Weights   12/27/22 1408  Weight: 87.5 kg    General appearance: Awake alert.  In no distress Resp: Clear to auscultation bilaterally.  Normal effort Cardio: S1-S2 is normal regular.  No S3-S4.  No rubs murmurs or bruit GI: Abdomen is soft.  Nontender nondistended.  Bowel sounds are present normal.  No masses organomegaly Extremities: Improving edema right lower extremity and the lower abdominal area.   Lab Results:  Data Reviewed: I have personally reviewed following labs and reports of the imaging studies  CBC: Recent Labs  Lab 12/27/22 1428  12/28/22 0423 12/29/22 0606 12/30/22 0451  WBC 34.8* 24.1* 13.0* 9.7  NEUTROABS 33.4*  --   --   --   HGB 8.2* 7.7* 7.8* 8.1*  HCT 26.1* 25.5* 25.6* 25.5*  MCV 96.3 97.3 95.5 94.4  PLT 323 270 258 313    Basic Metabolic Panel: Recent Labs  Lab 12/27/22 1428 12/28/22 0423 12/29/22 0606 12/30/22 0451 12/31/22 0443  NA 135 134* 132* 135 134*  K 4.4 4.5 4.2 4.0 4.0  CL 105 105 104 105 103  CO2 22 20* 22 22 22   GLUCOSE 91 83 74 78 73  BUN 48* 48* 47* 46* 41*  CREATININE 2.29* 2.25* 2.33* 2.38* 2.30*  CALCIUM  7.7* 7.6* 7.6* 7.9* 8.0*    GFR: Estimated Creatinine Clearance: 22.3 mL/min (A) (by C-G formula based on SCr of 2.3 mg/dL (H)).  Liver Function Tests: Recent Labs  Lab 12/27/22 1428  AST 45*  ALT 30  ALKPHOS 182*  BILITOT 0.9  PROT 6.5  ALBUMIN 2.8*     Recent Results (from the past 240 hour(s))  Blood culture (routine x 2)     Status: None (Preliminary result)   Collection Time: 12/27/22  2:28 PM   Specimen: Right Antecubital; Blood  Result Value Ref Range Status   Specimen Description   Final    RIGHT ANTECUBITAL BOTTLES DRAWN AEROBIC AND ANAEROBIC   Special Requests Blood Culture adequate volume  Final   Culture   Final    NO GROWTH 3 DAYS Performed at [facility], [address] [place], [place] [address]    Report Status PENDING  Incomplete  Blood culture (routine x 2)     Status: None (Preliminary result)   Collection Time: 12/27/22  2:35 PM   Specimen: BLOOD LEFT ARM  Result Value Ref Range Status   Specimen Description BLOOD LEFT ARM BOTTLES DRAWN AEROBIC AND ANAEROBIC  Final   Special Requests Blood Culture adequate volume  Final   Culture   Final    NO GROWTH 3 DAYS Performed at [facility], [address], [place], [place] [address]    Report Status PENDING  Incomplete  SARS Coronavirus 2 by RT PCR (hospital order, performed in [facility] hospital lab) *cepheid single result test* Anterior Nasal Swab     Status: None   Collection  Time: 12/27/22  3:51 PM   Specimen: Anterior Nasal Swab  Result Value Ref Range Status   SARS Coronavirus 2 by RT PCR NEGATIVE NEGATIVE Final    Comment: (NOTE) SARS-CoV-2 target nucleic acids are NOT DETECTED.  The SARS-CoV-2 RNA is generally detectable in upper and lower respiratory specimens during the acute phase of infection. The lowest concentration of SARS-CoV-2 viral copies this assay can detect is 250 copies / mL. A negative result does not preclude SARS-CoV-2 infection and should not be used as the sole basis for treatment or other patient management decisions.  A negative result may occur with improper specimen collection / handling, submission of specimen other than nasopharyngeal swab, presence of viral mutation(s) within the areas targeted by this assay, and inadequate number of viral copies (<250 copies / mL). A negative result must be combined with clinical observations, patient history, and epidemiological information.  Fact Sheet for Patients:   RoadLapTop.co.za  Fact Sheet for Healthcare Providers: http://kim-miller.com/  This test is not yet approved or  cleared by the [place] FDA and has been authorized for detection and/or diagnosis of SARS-CoV-2 by FDA under an Emergency Use Authorization (EUA).  This EUA will remain in effect (meaning this test can be used) for the duration of the COVID-19 declaration under Section 564(b)(1) of the Act, 21 U.S.C. section 360bbb-3(b)(1), unless the authorization is terminated or revoked sooner.  Performed at [facility], [address], [place], [place] [address]   MRSA Next Gen by PCR, Nasal     Status: None   Collection Time: 12/27/22  7:20 PM   Specimen: Nasal Mucosa; Nasal Swab  Result Value Ref Range Status   MRSA by PCR Next Gen NOT DETECTED NOT DETECTED Final    Comment: (NOTE) The GeneXpert MRSA Assay (FDA approved for NASAL specimens only), is one component of  a comprehensive MRSA  colonization surveillance program. It is not intended to diagnose MRSA infection nor to guide or monitor treatment for MRSA infections. Test performance is not FDA approved in patients less than 59 years old. Performed at [facility], [address], [place], [place] [address]       Radiology Studies: US Venous Img Lower Bilateral (DVT)  Result Date: 12/30/2022 CLINICAL DATA:  77 year old female with a history of swelling EXAM: BILATERAL LOWER EXTREMITY VENOUS DOPPLER ULTRASOUND TECHNIQUE: Gray-scale sonography with graded compression, as well as color Doppler and duplex ultrasound were performed to evaluate the lower extremity deep venous systems from the level of the common femoral vein and including the common femoral, femoral, profunda femoral, popliteal and calf veins including the posterior tibial, peroneal and gastrocnemius veins when visible. The superficial great saphenous vein was also interrogated. Spectral Doppler was utilized to evaluate flow at rest and with distal augmentation maneuvers in the common femoral, femoral and popliteal veins. COMPARISON:  10/23/2022 FINDINGS: RIGHT LOWER EXTREMITY Common Femoral Vein: No evidence of thrombus. Normal compressibility, respiratory phasicity and response to augmentation. Saphenofemoral Junction: No evidence of thrombus. Normal compressibility and flow on color Doppler imaging. Profunda Femoral Vein: No evidence of thrombus. Normal compressibility and flow on color Doppler imaging. Femoral Vein: No evidence of thrombus. Normal compressibility, respiratory phasicity and response to augmentation. Popliteal Vein: No evidence of thrombus. Normal compressibility, respiratory phasicity and response to augmentation. Calf Veins: No evidence of thrombus. Normal compressibility and flow on color Doppler imaging. Superficial Great Saphenous Vein: No evidence of thrombus. Normal compressibility and flow on color Doppler imaging. Other  Findings:  Edema LEFT LOWER EXTREMITY Common Femoral Vein: No evidence of thrombus. Normal compressibility, respiratory phasicity and response to augmentation. Saphenofemoral Junction: No evidence of thrombus. Normal compressibility and flow on color Doppler imaging. Profunda Femoral Vein: No evidence of thrombus. Normal compressibility and flow on color Doppler imaging. Femoral Vein: No evidence of thrombus. Normal compressibility, respiratory phasicity and response to augmentation. Popliteal Vein: No evidence of thrombus. Normal compressibility, respiratory phasicity and response to augmentation. Calf Veins: No evidence of thrombus. Normal compressibility and flow on color Doppler imaging. Superficial Great Saphenous Vein: No evidence of thrombus. Normal compressibility and flow on color Doppler imaging. Other Findings:  Edema IMPRESSION: Directed duplex of the bilateral lower extremities negative for DVT. Edema Signed, Yvone Neu. Miachel Roux, RPVI [facility] Electronically Signed   By: Gilmer Mor D.O.   On: 12/30/2022 14:44       LOS: 4 days    [name]  [facility] Pager on [email or web address]  12/31/2022, 9:41 AM

## 2022-12-31 NOTE — Plan of Care (Signed)

## 2023-01-01 ENCOUNTER — Other Ambulatory Visit: Payer: Self-pay | Admitting: *Deleted

## 2023-01-01 ENCOUNTER — Ambulatory Visit: Payer: Medicare Other | Admitting: Cardiology

## 2023-01-01 DIAGNOSIS — L03115 Cellulitis of right lower limb: Secondary | ICD-10-CM | POA: Diagnosis not present

## 2023-01-01 DIAGNOSIS — A419 Sepsis, unspecified organism: Secondary | ICD-10-CM | POA: Diagnosis not present

## 2023-01-01 DIAGNOSIS — I5032 Chronic diastolic (congestive) heart failure: Secondary | ICD-10-CM | POA: Diagnosis not present

## 2023-01-01 LAB — GLUCOSE, CAPILLARY: Glucose-Capillary: 76 mg/dL (ref 70–99)

## 2023-01-01 MED ORDER — ROPINIROLE HCL ER 2 MG PO TB24: 2.0000 mg | ORAL_TABLET | Freq: Every day | ORAL | 0 refills | Status: AC

## 2023-01-01 MED ORDER — LEVOFLOXACIN 500 MG PO TABS: 500.0000 mg | ORAL_TABLET | ORAL | 0 refills | Status: AC

## 2023-01-01 MED ORDER — VANCOMYCIN HCL 125 MG PO CAPS: 125.0000 mg | ORAL_CAPSULE | Freq: Four times a day (QID) | ORAL | 0 refills | Status: AC

## 2023-01-01 MED ORDER — OXYCODONE HCL 5 MG PO TABS: 5.0000 mg | ORAL_TABLET | Freq: Every day | ORAL | 0 refills | Status: AC | PRN

## 2023-01-01 NOTE — Progress Notes (Signed)
Mobility Specialist Progress Note:    01/01/23 1130  Mobility  Activity Ambulated with assistance in room  Level of Assistance Moderate assist, patient does 50-74%  Assistive Device Front wheel walker  Distance Ambulated (ft) 20 ft  Range of Motion/Exercises Active;All extremities  Activity Response Tolerated well  Mobility Referral Yes  $Mobility charge 1 Mobility  Mobility Specialist Start Time (ACUTE ONLY) 1130  Mobility Specialist Stop Time (ACUTE ONLY) 1140  Mobility Specialist Time Calculation (min) (ACUTE ONLY) 10 min   Pt was received in bed, agreeable to mobility session. Required ModA to stand, CGA to ambulate with RW. Tolerated well, left pt in chair. Alarm on, all  needs met.   Lawerance Bach Mobility Specialist Please contact via Special educational needs teacher or  Rehab office at [phone]

## 2023-01-01 NOTE — Discharge Summary (Signed)
Physician Discharge Summary  [name] ZOX:[id number] DOB: Aug 04, 1945 DOA: 12/27/2022  PCP: [name], FNP  Admit date: 12/27/2022  Discharge date: 01/01/2023  Admitted From:SNF  Disposition:  SNF  Recommendations for Outpatient Follow-up:  Follow up with PCP in 1-2 weeks Remain on Levaquin as prescribed for 4 more days to complete course of treatment for cellulitis as well as vancomycin for 11 more days prophylactically due to history of C. difficile colitis Remain on other medications as prior and continue Eliquis for 3 months from 5/29  Home Health:  Equipment/Devices:  Discharge Condition:Stable  CODE STATUS: Full  Diet recommendation: Heart Healthy  Brief/Interim Summary: 77 y.o. female with a history of COPD, sacral decubitus ulcer, HTN, HLD, hypothyroidism, depression/anxiety, GERD and recent admission (discharged 6/3) for UTI, C. difficile colitis, acute DVT and cellulitis who presented to the ED from [facility] on 12/27/2022 with fever, swelling, redness, and pain in the right leg. She had a low grade fever, leukocytosis to 34.8k, normal lactic acid, and clinical evidence of cellulitis.  Patient was started on Levaquin on account of her drug allergies and has now been switched over to oral Levaquin.  She was also started on oral vancomycin prophylactically given history of C. difficile colitis.  She was also noted to have edema as well as acute on chronic HFpEF and was given Lasix during this admission and will require continuation of furosemide on discharge.  No other acute events or concerns noted.  Discharge Diagnoses:  Principal Problem:   Sepsis (HCC) Active Problems:   Left Leg Extensive DVT (deep venous thrombosis) (HCC)   Acquired hypothyroidism   Anemia   Renal insufficiency   Decubitus ulcer   Obesity  Principal discharge diagnosis: Right lower extremity cellulitis/abdominal wall cellulitis with sepsis, POA.  Acute on chronic HFpEF.  Discharge  Instructions  Discharge Instructions     Diet - low sodium heart healthy   Complete by: As directed    Discharge wound care:   Complete by: As directed    Every shift      Comments: Clean sacral wound with NS, apply silver hydrofiber Hart [place] [phone]) cut to fit wound bed making sure silver makes contact with wound bed and is tucked into any undermining. Cover with dry gauze.   Apply a thin layer of Desitin to skin of buttocks and sacrum twice daily and prn soiling.  May cover with ABD pad and tape or silicone foam whichever is preferred   Increase activity slowly   Complete by: As directed       Allergies as of 01/01/2023       Reactions   Celebrex [celecoxib] Swelling   Keflex [cephalexin] Swelling   Penicillins Hives, Swelling   Sulfa Antibiotics Swelling   Kenalog [triamcinolone Acetonide] Other (See Comments)   Unknown reaction   Latex Other (See Comments)   Unknown reaction   Mobic [meloxicam] Swelling   Zestril [lisinopril] Other (See Comments)   Unknown reaction        Medication List     TAKE these medications    acetaminophen 325 MG tablet Commonly known as: TYLENOL Take 2 tablets (650 mg total) by mouth every 6 (six) hours as needed for mild pain (or Fever >/= 101).   ascorbic acid 500 MG tablet Commonly known as: VITAMIN C Take 500 mg by mouth 2 (two) times daily.   citalopram 40 MG tablet Commonly known as: CeleXA Take 1 tablet (40 mg total) by mouth daily.   Eliquis DVT/PE Starter  Pack Generic drug: Apixaban Starter Pack (10mg  and 5mg ) Take as directed on package: start with two-5mg  tablets twice daily for 7 days. On day 8, switch to one-5mg  tablet twice daily. What changed:  how much to take how to take this when to take this additional instructions   feeding supplement (PRO-STAT 64) Liqd Take 30 mLs by mouth 2 (two) times daily.   ferrous sulfate 325 (65 FE) MG tablet Take 1 tablet (325 mg total) by mouth daily with breakfast.   furosemide  20 MG tablet Commonly known as: LASIX Take 1 tablet (20 mg total) by mouth daily.   glucose 4 GM chewable tablet Chew 1 tablet (4 g total) by mouth as needed for low blood sugar.   levofloxacin 500 MG tablet Commonly known as: LEVAQUIN Take 1 tablet (500 mg total) by mouth every other day for 4 days. Start taking on: January 02, 2023   levothyroxine 75 MCG tablet Commonly known as: SYNTHROID Take 1 tablet (75 mcg total) by mouth daily.   magnesium oxide 400 (240 Mg) MG tablet Commonly known as: MAG-OX Take 400 mg by mouth daily.   mirtazapine 7.5 MG tablet Commonly known as: REMERON Take 1 tablet (7.5 mg total) by mouth at bedtime. For appetite stimulation   multivitamin tablet Take 1 tablet by mouth daily.   nutrition supplement (JUVEN) Pack Take 1 packet by mouth 2 (two) times daily.   oxyCODONE 5 MG immediate release tablet Commonly known as: Oxy IR/ROXICODONE Take 1 tablet (5 mg total) by mouth daily as needed for severe pain.   rOPINIRole 2 MG 24 hr tablet Commonly known as: REQUIP XL Take 1 tablet (2 mg total) by mouth at bedtime.   sodium bicarbonate 650 MG tablet Take 1 tablet (650 mg total) by mouth 3 (three) times daily.   vancomycin 125 MG capsule Commonly known as: VANCOCIN Take 1 capsule (125 mg total) by mouth 4 (four) times daily for 11 days.   vitamin D3 50 MCG (2000 UT) Caps Take 2,000 Units by mouth every morning.   Zinc 220 (50 Zn) MG Caps Take 220 mg by mouth every morning.               Discharge Care Instructions  (From admission, onward)           Start     Ordered   01/01/23 0000  Discharge wound care:       Comments: Every shift      Comments: Clean sacral wound with NS, apply silver hydrofiber Hart [place] [phone]) cut to fit wound bed making sure silver makes contact with wound bed and is tucked into any undermining. Cover with dry gauze.   Apply a thin layer of Desitin to skin of buttocks and sacrum twice daily and prn soiling.   May cover with ABD pad and tape or silicone foam whichever is preferred   01/01/23 1023            Follow-up Information     [name], FNP. Schedule an appointment as soon as possible for a visit in 1 week(s).   Specialty: Family Medicine Contact information: [address] [place] [address] [phone]                Allergies  Allergen Reactions   Celebrex [Celecoxib] Swelling   Keflex [Cephalexin] Swelling   Penicillins Hives and Swelling   Sulfa Antibiotics Swelling   Kenalog [Triamcinolone Acetonide] Other (See Comments)    Unknown reaction  Latex Other (See Comments)    Unknown reaction   Mobic [Meloxicam] Swelling   Zestril [Lisinopril] Other (See Comments)    Unknown reaction    Consultations: None   Procedures/Studies: US Venous Img Lower Bilateral (DVT)  Result Date: 12/30/2022 CLINICAL DATA:  77 year old female with a history of swelling EXAM: BILATERAL LOWER EXTREMITY VENOUS DOPPLER ULTRASOUND TECHNIQUE: Gray-scale sonography with graded compression, as well as color Doppler and duplex ultrasound were performed to evaluate the lower extremity deep venous systems from the level of the common femoral vein and including the common femoral, femoral, profunda femoral, popliteal and calf veins including the posterior tibial, peroneal and gastrocnemius veins when visible. The superficial great saphenous vein was also interrogated. Spectral Doppler was utilized to evaluate flow at rest and with distal augmentation maneuvers in the common femoral, femoral and popliteal veins. COMPARISON:  10/23/2022 FINDINGS: RIGHT LOWER EXTREMITY Common Femoral Vein: No evidence of thrombus. Normal compressibility, respiratory phasicity and response to augmentation. Saphenofemoral Junction: No evidence of thrombus. Normal compressibility and flow on color Doppler imaging. Profunda Femoral Vein: No evidence of thrombus. Normal compressibility and flow on color  Doppler imaging. Femoral Vein: No evidence of thrombus. Normal compressibility, respiratory phasicity and response to augmentation. Popliteal Vein: No evidence of thrombus. Normal compressibility, respiratory phasicity and response to augmentation. Calf Veins: No evidence of thrombus. Normal compressibility and flow on color Doppler imaging. Superficial Great Saphenous Vein: No evidence of thrombus. Normal compressibility and flow on color Doppler imaging. Other Findings:  Edema LEFT LOWER EXTREMITY Common Femoral Vein: No evidence of thrombus. Normal compressibility, respiratory phasicity and response to augmentation. Saphenofemoral Junction: No evidence of thrombus. Normal compressibility and flow on color Doppler imaging. Profunda Femoral Vein: No evidence of thrombus. Normal compressibility and flow on color Doppler imaging. Femoral Vein: No evidence of thrombus. Normal compressibility, respiratory phasicity and response to augmentation. Popliteal Vein: No evidence of thrombus. Normal compressibility, respiratory phasicity and response to augmentation. Calf Veins: No evidence of thrombus. Normal compressibility and flow on color Doppler imaging. Superficial Great Saphenous Vein: No evidence of thrombus. Normal compressibility and flow on color Doppler imaging. Other Findings:  Edema IMPRESSION: Directed duplex of the bilateral lower extremities negative for DVT. Edema Signed, Yvone Neu. Miachel Roux, RPVI [facility] Electronically Signed   By: Gilmer Mor D.O.   On: 12/30/2022 14:44   DG Chest Port 1 View  Result Date: 12/27/2022 CLINICAL DATA:  Shortness of breath EXAM: PORTABLE CHEST 1 VIEW COMPARISON:  X-ray 10/19/2022 FINDINGS: Enlarged cardiopericardial silhouette with calcified aorta. Vascular congestion. Trace edema. No pneumothorax or effusion. No consolidation. Curvature of the spine with degenerative changes. Artifact from the patient's  clothing. Film is under penetrated. IMPRESSION: Enlarged heart with vascular congestion and trace edema. Electronically Signed   By: Karen Kays M.D.   On: 12/27/2022 14:56     Discharge Exam: Vitals:   01/01/23 0336 01/01/23 0852  BP: 114/60 139/66  Pulse: (!) 49 (!) 58  Resp: 18 18  Temp: 97.7 F (36.5 C) 99 F (37.2 C)  SpO2: 98% 96%   Vitals:   12/31/22 1334 12/31/22 1915 01/01/23 0336 01/01/23 0852  BP: 126/62 133/67 114/60 139/66  Pulse: 60 (!) 54 (!) 49 (!) 58  Resp: 18 18 18 18   Temp: 98.2 F (36.8 C) 98.5 F (36.9 C) 97.7 F (36.5 C) 99 F (37.2 C)  TempSrc: Oral Oral  Oral  SpO2: 95% 100% 98% 96%  Weight:  Height:        General: Pt is alert, awake, not in acute distress Cardiovascular: RRR, S1/S2 +, no rubs, no gallops Respiratory: CTA bilaterally, no wheezing, no rhonchi Abdominal: Soft, NT, ND, bowel sounds + Extremities: no edema, no cyanosis, right lower extremity erythema improved    The results of significant diagnostics from this hospitalization (including imaging, microbiology, ancillary and laboratory) are listed below for reference.     Microbiology: Recent Results (from the past 240 hour(s))  Blood culture (routine x 2)     Status: None   Collection Time: 12/27/22  2:28 PM   Specimen: Right Antecubital; Blood  Result Value Ref Range Status   Specimen Description   Final    RIGHT ANTECUBITAL BOTTLES DRAWN AEROBIC AND ANAEROBIC   Special Requests Blood Culture adequate volume  Final   Culture   Final    NO GROWTH 5 DAYS Performed at [facility], [address] [place], [place] [address]    Report Status 01/01/2023 FINAL  Final  Blood culture (routine x 2)     Status: None   Collection Time: 12/27/22  2:35 PM   Specimen: BLOOD LEFT ARM  Result Value Ref Range Status   Specimen Description BLOOD LEFT ARM BOTTLES DRAWN AEROBIC AND ANAEROBIC  Final   Special Requests Blood Culture adequate volume  Final   Culture   Final    NO GROWTH  5 DAYS Performed at [facility], [address], [place], [place] [address]    Report Status 01/01/2023 FINAL  Final  SARS Coronavirus 2 by RT PCR (hospital order, performed in [facility] hospital lab) *cepheid single result test* Anterior Nasal Swab     Status: None   Collection Time: 12/27/22  3:51 PM   Specimen: Anterior Nasal Swab  Result Value Ref Range Status   SARS Coronavirus 2 by RT PCR NEGATIVE NEGATIVE Final    Comment: (NOTE) SARS-CoV-2 target nucleic acids are NOT DETECTED.  The SARS-CoV-2 RNA is generally detectable in upper and lower respiratory specimens during the acute phase of infection. The lowest concentration of SARS-CoV-2 viral copies this assay can detect is 250 copies / mL. A negative result does not preclude SARS-CoV-2 infection and should not be used as the sole basis for treatment or other patient management decisions.  A negative result may occur with improper specimen collection / handling, submission of specimen other than nasopharyngeal swab, presence of viral mutation(s) within the areas targeted by this assay, and inadequate number of viral copies (<250 copies / mL). A negative result must be combined with clinical observations, patient history, and epidemiological information.  Fact Sheet for Patients:   RoadLapTop.co.za  Fact Sheet for Healthcare Providers: http://kim-miller.com/  This test is not yet approved or  cleared by the [place] FDA and has been authorized for detection and/or diagnosis of SARS-CoV-2 by FDA under an Emergency Use Authorization (EUA).  This EUA will remain in effect (meaning this test can be used) for the duration of the COVID-19 declaration under Section 564(b)(1) of the Act, 21 U.S.C. section 360bbb-3(b)(1), unless the authorization is terminated or revoked sooner.  Performed at [facility], [address] [place], [place] [address]   MRSA Next Gen by PCR, Nasal      Status: None   Collection Time: 12/27/22  7:20 PM   Specimen: Nasal Mucosa; Nasal Swab  Result Value Ref Range Status   MRSA by PCR Next Gen NOT DETECTED NOT DETECTED Final    Comment: (NOTE)  The GeneXpert MRSA Assay (FDA approved for NASAL specimens only), is one component of a comprehensive MRSA colonization surveillance program. It is not intended to diagnose MRSA infection nor to guide or monitor treatment for MRSA infections. Test performance is not FDA approved in patients less than 42 years old. Performed at [facility], [address] [place], [place] [address]      Labs: BNP (last 3 results) Recent Labs    07/06/22 1850  BNP 264.0*   Basic Metabolic Panel: Recent Labs  Lab 12/28/22 0423 12/29/22 0606 12/30/22 0451 12/31/22 0443 01/01/23 0443  NA 134* 132* 135 134* 135  K 4.5 4.2 4.0 4.0 4.7  CL 105 104 105 103 104  CO2 20* 22 22 22 23   GLUCOSE 83 74 78 73 66*  BUN 48* 47* 46* 41* 37*  CREATININE 2.25* 2.33* 2.38* 2.30* 2.27*  CALCIUM 7.6* 7.6* 7.9* 8.0* 8.1*   Liver Function Tests: Recent Labs  Lab 12/27/22 1428  AST 45*  ALT 30  ALKPHOS 182*  BILITOT 0.9  PROT 6.5  ALBUMIN 2.8*   No results for input(s): "LIPASE", "AMYLASE" in the last 168 hours. No results for input(s): "AMMONIA" in the last 168 hours. CBC: Recent Labs  Lab 12/27/22 1428 12/28/22 0423 12/29/22 0606 12/30/22 0451  WBC 34.8* 24.1* 13.0* 9.7  NEUTROABS 33.4*  --   --   --   HGB 8.2* 7.7* 7.8* 8.1*  HCT 26.1* 25.5* 25.6* 25.5*  MCV 96.3 97.3 95.5 94.4  PLT 323 270 258 313   Cardiac Enzymes: No results for input(s): "CKTOTAL", "CKMB", "CKMBINDEX", "TROPONINI" in the last 168 hours. BNP: Invalid input(s): "POCBNP" CBG: Recent Labs  Lab 12/29/22 1638 01/01/23 0747  GLUCAP 88 76   D-Dimer No results for input(s): "DDIMER" in the last 72 hours. Hgb A1c No results for input(s): "HGBA1C" in the last 72 hours. Lipid Profile No results for input(s): "CHOL", "HDL",  "LDLCALC", "TRIG", "CHOLHDL", "LDLDIRECT" in the last 72 hours. Thyroid function studies No results for input(s): "TSH", "T4TOTAL", "T3FREE", "THYROIDAB" in the last 72 hours.  Invalid input(s): "FREET3" Anemia work up No results for input(s): "VITAMINB12", "FOLATE", "FERRITIN", "TIBC", "IRON", "RETICCTPCT" in the last 72 hours. Urinalysis    Component Value Date/Time   COLORURINE STRAW (A) 11/12/2022 1607   APPEARANCEUR CLEAR 11/12/2022 1607   APPEARANCEUR Clear 03/01/2021 1257   LABSPEC 1.008 11/12/2022 1607   PHURINE 5.0 11/12/2022 1607   GLUCOSEU NEGATIVE 11/12/2022 1607   HGBUR NEGATIVE 11/12/2022 1607   BILIRUBINUR NEGATIVE 11/12/2022 1607   BILIRUBINUR Negative 03/01/2021 1257   KETONESUR NEGATIVE 11/12/2022 1607   PROTEINUR NEGATIVE 11/12/2022 1607   NITRITE NEGATIVE 11/12/2022 1607   LEUKOCYTESUR NEGATIVE 11/12/2022 1607   Sepsis Labs Recent Labs  Lab 12/27/22 1428 12/28/22 0423 12/29/22 0606 12/30/22 0451  WBC 34.8* 24.1* 13.0* 9.7   Microbiology Recent Results (from the past 240 hour(s))  Blood culture (routine x 2)     Status: None   Collection Time: 12/27/22  2:28 PM   Specimen: Right Antecubital; Blood  Result Value Ref Range Status   Specimen Description   Final    RIGHT ANTECUBITAL BOTTLES DRAWN AEROBIC AND ANAEROBIC   Special Requests Blood Culture adequate volume  Final   Culture   Final    NO GROWTH 5 DAYS Performed at [facility], [address] [place], [place] [address]    Report Status 01/01/2023 FINAL  Final  Blood culture (routine x 2)     Status: None  Collection Time: 12/27/22  2:35 PM   Specimen: BLOOD LEFT ARM  Result Value Ref Range Status   Specimen Description BLOOD LEFT ARM BOTTLES DRAWN AEROBIC AND ANAEROBIC  Final   Special Requests Blood Culture adequate volume  Final   Culture   Final    NO GROWTH 5 DAYS Performed at [facility], [address] [place], [place] [address]    Report Status 01/01/2023 FINAL  Final  SARS  Coronavirus 2 by RT PCR (hospital order, performed in [facility] hospital lab) *cepheid single result test* Anterior Nasal Swab     Status: None   Collection Time: 12/27/22  3:51 PM   Specimen: Anterior Nasal Swab  Result Value Ref Range Status   SARS Coronavirus 2 by RT PCR NEGATIVE NEGATIVE Final    Comment: (NOTE) SARS-CoV-2 target nucleic acids are NOT DETECTED.  The SARS-CoV-2 RNA is generally detectable in upper and lower respiratory specimens during the acute phase of infection. The lowest concentration of SARS-CoV-2 viral copies this assay can detect is 250 copies / mL. A negative result does not preclude SARS-CoV-2 infection and should not be used as the sole basis for treatment or other patient management decisions.  A negative result may occur with improper specimen collection / handling, submission of specimen other than nasopharyngeal swab, presence of viral mutation(s) within the areas targeted by this assay, and inadequate number of viral copies (<250 copies / mL). A negative result must be combined with clinical observations, patient history, and epidemiological information.  Fact Sheet for Patients:   RoadLapTop.co.za  Fact Sheet for Healthcare Providers: http://kim-miller.com/  This test is not yet approved or  cleared by the [place] FDA and has been authorized for detection and/or diagnosis of SARS-CoV-2 by FDA under an Emergency Use Authorization (EUA).  This EUA will remain in effect (meaning this test can be used) for the duration of the COVID-19 declaration under Section 564(b)(1) of the Act, 21 U.S.C. section 360bbb-3(b)(1), unless the authorization is terminated or revoked sooner.  Performed at [facility], [address] [place], [place] [address]   MRSA Next Gen by PCR, Nasal     Status: None   Collection Time: 12/27/22  7:20 PM   Specimen: Nasal Mucosa; Nasal Swab  Result Value Ref Range Status    MRSA by PCR Next Gen NOT DETECTED NOT DETECTED Final    Comment: (NOTE) The GeneXpert MRSA Assay (FDA approved for NASAL specimens only), is one component of a comprehensive MRSA colonization surveillance program. It is not intended to diagnose MRSA infection nor to guide or monitor treatment for MRSA infections. Test performance is not FDA approved in patients less than 29 years old. Performed at [facility], [address], [place], [place] [address]      Time coordinating discharge: 35 minutes  SIGNED:   Erick Blinks, DO [facility] 01/01/2023, 11:10 AM  If 7PM-7AM, please contact night-coverage [email or web address]

## 2023-01-01 NOTE — Care Management Important Message (Signed)
Important Message  Patient Details  Name: [name] MRN: [id number] Date of Birth: Aug 28, 1945   Medicare Important Message Given:  Yes     Corey Harold 01/01/2023, 10:48 AM

## 2023-01-01 NOTE — Progress Notes (Signed)
CBG was 66 on Lab this am,Dr Sherryll Burger notified.Plan of care on going.

## 2023-01-01 NOTE — Consult Note (Addendum)
[facility] Customer service manager [facility]) Accountable Care Organization (ACO) [facility] Liaison Note  01/01/2023  [name] 1946/02/21 [id number]  Location: [facility] Liaison screened the patient remotely at [facility].  Insurance:  Medicare   [name] is a 77 y.o. female who is a Primary Care Patient of [name], FNP. The patient was screened for  readmission hospitalization with noted extreme risk score for unplanned readmission risk with 5 IP/2 ED in 6 months.  The patient was assessed for potential [facility]) Care Management service needs for post hospital transition for care coordination. Review of patient's electronic medical record reveals patient was admitted for Sepsis 2nd to Cellulitis RLL. Liaison spoke with pt concerning care coordination and possible benefits to assist with her ongoing management of care.    PLAN: Pt reports she will return to [facility] for ongoing rehab today. Family will address pt's ongoing care management needs at this time. No further request or inquires at this time.   [facility] Care Management/Population Health does not replace or interfere with any arrangements made by the Inpatient Transition of Care team.   For questions contact:   Elliot Cousin, RN, [facility] Liaison [place]   Population Health Office Hours MTWF  8:00 am-6:00 pm Off on Thursday [phone] mobile [phone] [Office toll free line] Office Hours are M-F 8:30 - 5 pm [email or web address]

## 2023-01-01 NOTE — TOC Transition Note (Addendum)
Transition of Care [facility]) - CM/SW Discharge Note   Patient Details  Name: [name] MRN: [id number] Date of Birth: 09-19-1945  Transition of Care [facility]) CM/SW Contact:  Karn Cassis, LCSW Phone Number: 01/01/2023, 11:28 AM   Clinical Narrative: Pt d/c today back to [facility]. Pt, son, and facility aware and agreeable. SNF aware pt requesting PT at facility. D/C summary sent to SNF. RN given number to call report. Son reports he is unable to pick up pt and requests [facility]. LCSW to arrange. Rider waiver signed and on chart.      Final next level of care: Skilled Nursing Facility Barriers to Discharge: Barriers Resolved   Patient Goals and CMS Choice      Discharge Placement                  Patient to be transferred to facility by: [facility] Name of family member notified: [name]- son Patient and family notified of of transfer: 01/01/23  Discharge Plan and Services Additional resources added to the After Visit Summary for                                       Social Determinants of Health (SDOH) Interventions SDOH Screenings   Food Insecurity: Patient Declined (12/27/2022)  Housing: Patient Declined (12/27/2022)  Transportation Needs: Patient Declined (12/27/2022)  Utilities: Not At Risk (12/27/2022)  Alcohol Screen: Low Risk  (12/02/2022)  Depression (PHQ2-9): Low Risk  (12/02/2022)  Financial Resource Strain: Low Risk  (12/02/2022)  Physical Activity: Insufficiently Active (12/02/2022)  Social Connections: Moderately Isolated (12/02/2022)  Stress: No Stress Concern Present (12/02/2022)  Tobacco Use: Low Risk  (12/27/2022)     Readmission Risk Interventions    12/28/2022    5:15 PM 10/21/2022   10:12 AM 08/30/2022    3:36 PM  Readmission Risk Prevention Plan  Transportation Screening Complete Complete Complete  PCP or Specialist Appt within 3-5 Days   Complete  Social Work Consult for Recovery Care Planning/Counseling   Complete  Palliative  Care Screening   Not Applicable  Medication Review [employer]) Complete Complete Complete  PCP or Specialist appointment within 3-5 days of discharge Complete    HRI or Home Care Consult Complete Complete   SW Recovery Care/Counseling Consult Complete Complete   Palliative Care Screening Not Applicable Not Applicable   Skilled Nursing Facility Complete Complete

## 2023-01-01 NOTE — Progress Notes (Signed)
Patient being discharged to [facility], report called and given to Lorelee Market RN. IV discontinued,catheter intact. Patient to be transported by [facility] to facility.

## 2023-01-07 DIAGNOSIS — M6281 Muscle weakness (generalized): Secondary | ICD-10-CM | POA: Diagnosis not present

## 2023-01-08 ENCOUNTER — Encounter (HOSPITAL_BASED_OUTPATIENT_CLINIC_OR_DEPARTMENT_OTHER): Payer: Medicare Other | Attending: Physician Assistant | Admitting: Physician Assistant

## 2023-01-08 DIAGNOSIS — M6281 Muscle weakness (generalized): Secondary | ICD-10-CM | POA: Diagnosis not present

## 2023-01-08 DIAGNOSIS — N183 Chronic kidney disease, stage 3 unspecified: Secondary | ICD-10-CM | POA: Diagnosis not present

## 2023-01-08 DIAGNOSIS — L89154 Pressure ulcer of sacral region, stage 4: Secondary | ICD-10-CM | POA: Diagnosis not present

## 2023-01-08 DIAGNOSIS — I130 Hypertensive heart and chronic kidney disease with heart failure and stage 1 through stage 4 chronic kidney disease, or unspecified chronic kidney disease: Secondary | ICD-10-CM | POA: Insufficient documentation

## 2023-01-08 DIAGNOSIS — F0150 Vascular dementia without behavioral disturbance: Secondary | ICD-10-CM | POA: Insufficient documentation

## 2023-01-08 DIAGNOSIS — I480 Paroxysmal atrial fibrillation: Secondary | ICD-10-CM | POA: Diagnosis not present

## 2023-01-08 DIAGNOSIS — J449 Chronic obstructive pulmonary disease, unspecified: Secondary | ICD-10-CM | POA: Diagnosis not present

## 2023-01-08 NOTE — Progress Notes (Signed)
[name], [name] ([id number]) [id number] Page 1 of 7 Visit Report for 01/08/2023 Chief Complaint Document Details Patient Name: Date of Service: [name], [name] NA 01/08/2023 12:30 PM Medical Record Number: [id number] Patient Account Number: [id number] Date of Birth/Sex: Treating RN: [birth year] (77 y.o. F) Primary Care Provider: Daphine Deutscher, Mary-Margaret Other Clinician: Referring Provider: Treating Provider/Extender: Lauretta Grill, Mary-Margaret Weeks in Treatment: 12 Information Obtained from: Patient Chief Complaint Sacral pressure ulcer Electronic Signature(s) Signed: 01/08/2023 1:10:11 PM By: Allen Derry PA-C Entered By: Allen Derry on 01/08/2023 13:10:11 -------------------------------------------------------------------------------- HPI Details Patient Name: Date of Service: [name], [name] NA 01/08/2023 12:30 PM Medical Record Number: [id number] Patient Account Number: [id number] Date of Birth/Sex: Treating RN: Apr 23, 1946 (77 y.o. F) Primary Care Provider: Daphine Deutscher, Mary-Margaret Other Clinician: Referring Provider: Treating Provider/Extender: Lauretta Grill, Mary-Margaret Weeks in Treatment: 12 History of Present Illness HPI Description: 10-16-2022 upon evaluation today patient appears to be doing somewhat poorly in regard to a wound in the sacral area/lower back region. Fortunately there does not appear to be any signs of infection at this point which is great news. With that being said she unfortunately did develop this wound as a result of having had a hospital stay which dates back to looks like at least the beginning of April where there was unstageable wound noted. It was between May 2 and 7 that she fell and broke her shoulder and was put in a sling that she had C. difficile infection and subsequently this is when the wound was mostly described. Fortunately there does not appear to be any signs of infection locally or systemically which is great  news but at the same time she does have a significantly deep wound that is of concern at this point. That is the reason she comes in for evaluation to see if there is anything we can offer or recommend to get this healed faster. Patient does have a history of COPD, hypertension, chronic kidney disease stage III, atrial fibrillation, and vascular dementia although there is no behavioral disturbance that I see or perceived that she seems to be answering questions appropriately and asking good questions to be honest. 10-30-2022 upon evaluation today patient appears to be doing well currently in regard to her wound which appears to be healthy at this point. I do not see any signs of active infection locally nor systemically which is great news. No fevers, chills, nausea, vomiting, or diarrhea. With that being said I am actually not seeing much improvement compared to last time I saw her simply due to the fact that she has not had the wound VAC on she was seen on the 22nd and then subsequently ended up being admitted to the hospital on the 25th due to a blood clot in her leg. This ended with therapy in the hospital through what appears to be the third so she is going to back to the facility for 2 days before coming in today and then subsequently the wound VAC was not used upon readmission. We did have a letter from the treatment nurse that stated the patient transfers independently multiple times throughout the day. Obviously based on the weakness and what I am seeing it took to individuals my nurse and the individual with her from the facility to move her into the bed today safely. I do not think she needs to be transferring independently and I think she may need a sitter in fact we might even need to do this long-term while she is on the wound  VAC in order to ensure that she does not try to get up on her own and in the process hurt herself. Fortunately I do not see any signs of active infection locally  nor systemically which is great news. I do still think a wound VAC would be the primary best way to get this closed as quickly as possible. 11-13-2022 upon evaluation patient appears to be doing better in regard to her wounds from a size perspective although are still having a lot of issues here with the facility putting on the wound VAC appropriately. They continue to not really do this appropriately which has been the primary concern. In fact because of that it is not really functioning properly and continues to not do as well as we really think it should be. Nonetheless we will going to see about having [employer] go out and do some training with regard to placement of wound vacs. 11-20-2022 upon evaluation today patient appears to be doing well currently in regard to her wound. She is actually showing signs of improvement I am actually very pleased with where things stand I do believe that wound VAC is doing a good job here. Fortunately I do not see any evidence of active infection locally or systemically which is great news. No fevers, chills, nausea, vomiting, or diarrhea. [name], [name] ([id number]) [id number] Page 2 of 7 11-27-2022 upon evaluation patient's wound is doing really about the same. I do not think it is any worse also do not think it significantly better at this point. I do think that she would benefit from a continuation of therapy with regard to the wound VAC but unfortunately this just does not seem to be staying in place which is unfortunate. 12-11-2022 upon evaluation patient's wound unfortunately does not appear to be doing quite as well today as I was hoping for. It does appear that she has some evidence of breakdown as far as the dressing not staying quite in place. I think that she needs to have this more appropriately secured with some bolster behind to help hold the dressing in place. The patient voiced understanding. Subsequently I do believe that we are  making good headway towards complete closure which is good news but at the same time we need to make sure that the dressing is maintaining contact with the base of the wound otherwise when I can see the improvement we want. 12-25-2022 upon evaluation today patient appears to be doing well currently in regard to her wound. She has been tolerating the dressing changes without complication. Fortunately there does not appear to be any signs of active infection at this time. No fevers, chills, nausea, vomiting, or diarrhea. 01-08-2023 upon evaluation today patient appears to be doing well currently in regard to her wound. Fortunately there does not appear to be any signs of active infection at this time which is great news and in general I do believe that we are making headway towards complete closure. With that being said I think that we may want to switch over to a collagen dressing at this point. Electronic Signature(s) Signed: 01/08/2023 1:53:17 PM By: Allen Derry PA-C Entered By: Allen Derry on 01/08/2023 13:53:17 -------------------------------------------------------------------------------- Physical Exam Details Patient Name: Date of Service: [name], [name] NA 01/08/2023 12:30 PM Medical Record Number: [id number] Patient Account Number: [id number] Date of Birth/Sex: Treating RN: Jun 16, 1945 (77 y.o. F) Primary Care Provider: Daphine Deutscher, Mary-Margaret Other Clinician: Referring Provider: Treating Provider/Extender: Lauretta Grill, Mary-Margaret Weeks in Treatment: 12 Constitutional Well-nourished and  well-hydrated in no acute distress. Respiratory normal breathing without difficulty. Psychiatric this patient is able to make decisions and demonstrates good insight into disease process. Alert and Oriented x 3. pleasant and cooperative. Notes Upon inspection patient's wound bed actually showed signs of good granulation epithelization at this point. Fortunately I do not see any evidence of  worsening overall and I do believe that the patient is making excellent headway towards closure I am going to recommend that such that we continue with the offloading which I think has been of great benefit. I am also can recommend that we have the patient continue with the dressing changes although I do recommend that we use collagen at this point as opposed to the [facility] see if we can get this to close more rapidly. Electronic Signature(s) Signed: 01/08/2023 1:53:57 PM By: Allen Derry PA-C Entered By: Allen Derry on 01/08/2023 13:53:56 -------------------------------------------------------------------------------- Physician Orders Details Patient Name: Date of Service: [name], [name] NA 01/08/2023 12:30 PM Medical Record Number: [id number] Patient Account Number: [id number] Date of Birth/Sex: Treating RN: 02-Aug-1945 (77 y.o. Arta Silence Primary Care Provider: Daphine Deutscher, Mary-Margaret Other Clinician: Referring Provider: Treating Provider/Extender: Lauretta Grill, Mary-Margaret Weeks in Treatment: 12 Verbal / Phone Orders: No Diagnosis [address] [place], [name] ([id number]) [id number] Page 3 of 7 ICD-10 Coding Code Description L89.154 Pressure ulcer of sacral region, stage 4 J44.9 Chronic obstructive pulmonary disease, unspecified I10 Essential (primary) hypertension N18.30 Chronic kidney disease, stage 3 unspecified I48.0 Paroxysmal atrial fibrillation F01.A0 Vascular dementia, mild, without behavioral disturbance, psychotic disturbance, mood disturbance, and anxiety Follow-up Appointments ppointment in 2 weeks. Leonard Schwartz Wednesday 01/22/2023 Room 8 (Dr. Leanord Hawking covering) 1245 Return A Return appointment in 1 month. Leonard Schwartz Wednesday 02/05/2023 Room 8 1245 Other: - Primary dressing change to collagen. Change x3 a week. Anesthetic (In clinic) Topical Lidocaine 4% applied to wound bed Off-Loading Low air-loss mattress (Group 2) - facility to ensure  patient is using a group 2 air mattress. Turn and reposition every 2 hours Other: - continue using wheelchair cushion when in wheelchair. patient to minimize being up in wheelchair. 2 hours in the chair then 1 hour in the bed. Additional Orders / Instructions Follow Nutritious Diet - increase protein to aid in wound healing. Juven Shake 1-2 times daily. Wound Treatment Wound #1 - Sacrum Cleanser: Wound Cleanser 3 x Per Week/30 Days Discharge Instructions: Cleanse the wound with wound cleanser prior to applying a clean dressing using gauze sponges, not tissue or cotton balls. Peri-Wound Care: Skin Prep 3 x Per Week/30 Days Discharge Instructions: Use skin prep as directed Prim Dressing: Fibracol Plus Dressing, 4x4.38 in (collagen) 3 x Per Week/30 Days ary Discharge Instructions: Moisten collagen with saline or hydrogel Secondary Dressing: Woven Gauze Sponge, Non-Sterile 4x4 in 3 x Per Week/30 Days Discharge Instructions: FOLD INTO A FOURTH (SMALL SQUARE) TO ENSURE THE HYDROFERA BLUE IS TOUCHING DOWN INTO THE WOUND BED. Secondary Dressing: Zetuvit Plus Silicone Border Dressing 4x4 (in/in) 3 x Per Week/30 Days Discharge Instructions: Apply silicone border over primary dressing as directed. Electronic Signature(s) Unsigned Entered By: Shawn Stall on 01/08/2023 13:37:48 -------------------------------------------------------------------------------- Problem List Details Patient Name: Date of Service: [name], [name] NA 01/08/2023 12:30 PM Medical Record Number: [id number] Patient Account Number: [id number] Date of Birth/Sex: Treating RN: 06/22/1945 (77 y.o. Arta Silence Primary Care Provider: Daphine Deutscher, Mary-Margaret Other Clinician: Referring Provider: Treating Provider/Extender: Lauretta Grill, Mary-Margaret Weeks in Treatment: 12 Active Problems ICD-10 Encounter Code Description Active Date MDM Diagnosis VENECIA, ENDRESS ([id number]) [id number]  Page 4  of 7 L89.154 Pressure ulcer of sacral region, stage 4 10/16/2022 No Yes J44.9 Chronic obstructive pulmonary disease, unspecified 10/16/2022 No Yes I10 Essential (primary) hypertension 10/16/2022 No Yes N18.30 Chronic kidney disease, stage 3 unspecified 10/16/2022 No Yes I48.0 Paroxysmal atrial fibrillation 10/16/2022 No Yes F01.A0 Vascular dementia, mild, without behavioral disturbance, psychotic 10/16/2022 No Yes disturbance, mood disturbance, and anxiety Inactive Problems Resolved Problems Electronic Signature(s) Signed: 01/08/2023 1:10:04 PM By: Allen Derry PA-C Entered By: Allen Derry on 01/08/2023 13:10:04 -------------------------------------------------------------------------------- Progress Note Details Patient Name: Date of Service: [name], [name] NA 01/08/2023 12:30 PM Medical Record Number: [id number] Patient Account Number: [id number] Date of Birth/Sex: Treating RN: [birth year] (77 y.o. F) Primary Care Provider: Daphine Deutscher, Mary-Margaret Other Clinician: Referring Provider: Treating Provider/Extender: Lauretta Grill, Mary-Margaret Weeks in Treatment: 12 Subjective Chief Complaint Information obtained from Patient Sacral pressure ulcer History of Present Illness (HPI) 10-16-2022 upon evaluation today patient appears to be doing somewhat poorly in regard to a wound in the sacral area/lower back region. Fortunately there does not appear to be any signs of infection at this point which is great news. With that being said she unfortunately did develop this wound as a result of having had a hospital stay which dates back to looks like at least the beginning of April where there was unstageable wound noted. It was between May 2 and 7 that she fell and broke her shoulder and was put in a sling that she had C. difficile infection and subsequently this is when the wound was mostly described. Fortunately there does not appear to be any signs of infection locally or systemically which is great  news but at the same time she does have a significantly deep wound that is of concern at this point. That is the reason she comes in for evaluation to see if there is anything we can offer or recommend to get this healed faster. Patient does have a history of COPD, hypertension, chronic kidney disease stage III, atrial fibrillation, and vascular dementia although there is no behavioral disturbance that I see or perceived that she seems to be answering questions appropriately and asking good questions to be honest. 10-30-2022 upon evaluation today patient appears to be doing well currently in regard to her wound which appears to be healthy at this point. I do not see any signs of active infection locally nor systemically which is great news. No fevers, chills, nausea, vomiting, or diarrhea. With that being said I am actually not seeing much improvement compared to last time I saw her simply due to the fact that she has not had the wound VAC on she was seen on the 22nd and then subsequently ended up being admitted to the hospital on the 25th due to a blood clot in her leg. This ended with therapy in the hospital through what appears to be the third so she is going to back to the facility for 2 days before coming in today and then subsequently the wound VAC was not used upon readmission. We did have a letter from the treatment nurse that stated the patient transfers independently multiple times throughout the day. Obviously based on the weakness and what I am seeing it took to individuals my nurse and the individual with her from the facility to move her into the bed today safely. I do not think she needs to be transferring independently and I think she may need a sitter in fact we might even need to do this long-term  while she is on the wound VAC in order to ensure that she does not try to get up on her own and in the process hurt herself. Fortunately I do not see any signs of active infection locally  nor [place], [name] ([id number]) [id number] Page 5 of 7 systemically which is great news. I do still think a wound VAC would be the primary best way to get this closed as quickly as possible. 11-13-2022 upon evaluation patient appears to be doing better in regard to her wounds from a size perspective although are still having a lot of issues here with the facility putting on the wound VAC appropriately. They continue to not really do this appropriately which has been the primary concern. In fact because of that it is not really functioning properly and continues to not do as well as we really think it should be. Nonetheless we will going to see about having [employer] go out and do some training with regard to placement of wound vacs. 11-20-2022 upon evaluation today patient appears to be doing well currently in regard to her wound. She is actually showing signs of improvement I am actually very pleased with where things stand I do believe that wound VAC is doing a good job here. Fortunately I do not see any evidence of active infection locally or systemically which is great news. No fevers, chills, nausea, vomiting, or diarrhea. 11-27-2022 upon evaluation patient's wound is doing really about the same. I do not think it is any worse also do not think it significantly better at this point. I do think that she would benefit from a continuation of therapy with regard to the wound VAC but unfortunately this just does not seem to be staying in place which is unfortunate. 12-11-2022 upon evaluation patient's wound unfortunately does not appear to be doing quite as well today as I was hoping for. It does appear that she has some evidence of breakdown as far as the dressing not staying quite in place. I think that she needs to have this more appropriately secured with some bolster behind to help hold the dressing in place. The patient voiced understanding. Subsequently I do believe that we are  making good headway towards complete closure which is good news but at the same time we need to make sure that the dressing is maintaining contact with the base of the wound otherwise when I can see the improvement we want. 12-25-2022 upon evaluation today patient appears to be doing well currently in regard to her wound. She has been tolerating the dressing changes without complication. Fortunately there does not appear to be any signs of active infection at this time. No fevers, chills, nausea, vomiting, or diarrhea. 01-08-2023 upon evaluation today patient appears to be doing well currently in regard to her wound. Fortunately there does not appear to be any signs of active infection at this time which is great news and in general I do believe that we are making headway towards complete closure. With that being said I think that we may want to switch over to a collagen dressing at this point. Objective Constitutional Well-nourished and well-hydrated in no acute distress. Vitals Time Taken: 1:07 PM, Height: 62 in, Weight: 139 lbs, BMI: 25.4, Pulse: 55 bpm, Respiratory Rate: 18 breaths/min, Blood Pressure: 113/69 mmHg. Respiratory normal breathing without difficulty. Psychiatric this patient is able to make decisions and demonstrates good insight into disease process. Alert and Oriented x 3. pleasant and cooperative. General Notes: Upon inspection patient's  wound bed actually showed signs of good granulation epithelization at this point. Fortunately I do not see any evidence of worsening overall and I do believe that the patient is making excellent headway towards closure I am going to recommend that such that we continue with the offloading which I think has been of great benefit. I am also can recommend that we have the patient continue with the dressing changes although I do recommend that we use collagen at this point as opposed to the [facility] see if we can get this to close more  rapidly. Integumentary (Hair, Skin) Wound #1 status is Open. Original cause of wound was Pressure Injury. The date acquired was: 08/26/2022. The wound has been in treatment 12 weeks. The wound is located on the Sacrum. The wound measures 1.8cm length x 1.1cm width x 0.4cm depth; 1.555cm^2 area and 0.622cm^3 volume. There is muscle and Fat Layer (Subcutaneous Tissue) exposed. There is no tunneling or undermining noted. There is a medium amount of serosanguineous drainage noted. The wound margin is thickened. There is no granulation within the wound bed. There is a large (67-100%) amount of necrotic tissue within the wound bed including Adherent Slough. The periwound skin appearance did not exhibit: Callus, Crepitus, Excoriation, Induration, Rash, Scarring, Dry/Scaly, Maceration, Atrophie Blanche, Cyanosis, Ecchymosis, Hemosiderin Staining, Mottled, Pallor, Rubor, Erythema. Assessment Active Problems ICD-10 Pressure ulcer of sacral region, stage 4 Chronic obstructive pulmonary disease, unspecified Essential (primary) hypertension Chronic kidney disease, stage 3 unspecified Paroxysmal atrial fibrillation Vascular dementia, mild, without behavioral disturbance, psychotic disturbance, mood disturbance, and anxiety Plan Follow-up Appointments: Return Appointment in 2 weeks. Leonard Schwartz Wednesday 01/22/2023 Room 8 (Dr. Leanord Hawking covering) [address], [name] ([id number]) [id number] Page 6 of 7 Return appointment in 1 month. Leonard Schwartz Wednesday 02/05/2023 Room 8 1245 Other: - Primary dressing change to collagen. Change x3 a week. Anesthetic: (In clinic) Topical Lidocaine 4% applied to wound bed Off-Loading: Low air-loss mattress (Group 2) - facility to ensure patient is using a group 2 air mattress. Turn and reposition every 2 hours Other: - continue using wheelchair cushion when in wheelchair. patient to minimize being up in wheelchair. 2 hours in the chair then 1 hour in the  bed. Additional Orders / Instructions: Follow Nutritious Diet - increase protein to aid in wound healing. Juven Shake 1-2 times daily. WOUND #1: - Sacrum Wound Laterality: Cleanser: Wound Cleanser 3 x Per Week/30 Days Discharge Instructions: Cleanse the wound with wound cleanser prior to applying a clean dressing using gauze sponges, not tissue or cotton balls. Peri-Wound Care: Skin Prep 3 x Per Week/30 Days Discharge Instructions: Use skin prep as directed Prim Dressing: Fibracol Plus Dressing, 4x4.38 in (collagen) 3 x Per Week/30 Days ary Discharge Instructions: Moisten collagen with saline or hydrogel Secondary Dressing: Woven Gauze Sponge, Non-Sterile 4x4 in 3 x Per Week/30 Days Discharge Instructions: FOLD INTO A FOURTH (SMALL SQUARE) TO ENSURE THE HYDROFERA BLUE IS TOUCHING DOWN INTO THE WOUND BED. Secondary Dressing: Zetuvit Plus Silicone Border Dressing 4x4 (in/in) 3 x Per Week/30 Days Discharge Instructions: Apply silicone border over primary dressing as directed. 1. I would recommend that we have the patient continue to monitor for any signs of infection or worsening based on what I am seeing I do believe that the [facility] Blue switched out for a collagen is gena be a better way to go. 2. I am going to recommend as well we continue with a bordered foam dressing to cover. 3 and recommend this be changed 3  times per week. We will see patient back for reevaluation in 1 week here in the clinic. If anything worsens or changes patient will contact our office for additional recommendations. Electronic Signature(s) Signed: 01/08/2023 1:54:18 PM By: Allen Derry PA-C Entered By: Allen Derry on 01/08/2023 13:54:17 -------------------------------------------------------------------------------- SuperBill Details Patient Name: Date of Service: [name], [name] NA 01/08/2023 Medical Record Number: [id number] Patient Account Number: [id number] Date of Birth/Sex: Treating RN: Sep 06, 1945 (77 y.o. Arta Silence Primary Care Provider: Daphine Deutscher, Mary-Margaret Other Clinician: Referring Provider: Treating Provider/Extender: Lauretta Grill, Mary-Margaret Weeks in Treatment: 12 Diagnosis Coding ICD-10 Codes Code Description L89.154 Pressure ulcer of sacral region, stage 4 J44.9 Chronic obstructive pulmonary disease, unspecified I10 Essential (primary) hypertension N18.30 Chronic kidney disease, stage 3 unspecified I48.0 Paroxysmal atrial fibrillation F01.A0 Vascular dementia, mild, without behavioral disturbance, psychotic disturbance, mood disturbance, and anxiety Facility Procedures : CPT4 Code: 40981191 Description: 99213 - WOUND CARE VISIT-LEV 3 EST PT Modifier: Quantity: 1 Physician Procedures : CPT4 Code Description Modifier [id number] 99213 - WC PHYS LEVEL 3 - EST PT ICD-10 Diagnosis Description L89.154 Pressure ulcer of sacral region, stage 4 Wimes, [name] ([id number]) [id number] Page 7 J44.9 Chronic obstructive  pulmonary disease, unspecified I10 Essential (primary) hypertension N18.30 Chronic kidney disease, stage 3 unspecified Quantity: 1 of 7 Electronic Signature(s) Signed: 01/08/2023 1:57:39 PM By: Allen Derry PA-C Entered By: Allen Derry on 01/08/2023 13:57:39

## 2023-01-09 DIAGNOSIS — M6281 Muscle weakness (generalized): Secondary | ICD-10-CM | POA: Diagnosis not present

## 2023-01-10 DIAGNOSIS — M6281 Muscle weakness (generalized): Secondary | ICD-10-CM | POA: Diagnosis not present

## 2023-01-10 NOTE — Progress Notes (Signed)
NARDA, GRAFT ([id number]) [id number] Page 1 of 8 Visit Report for 01/08/2023 Arrival Information Details Patient Name: Date of Service: Cindy, Saunders NA 01/08/2023 12:30 PM Medical Record Number: [id number] Patient Account Number: [id number] Date of Birth/Sex: Treating RN: 11-15-1945 (77 y.o. F) Primary Care Verona Hartshorn: Daphine Deutscher, Mary-Margaret Other Clinician: Referring Andrienne Havener: Treating Christabella Alvira/Extender: Lauretta Grill, Mary-Margaret Weeks in Treatment: 12 Visit Information History Since Last Visit Added or deleted any medications: Yes Patient Arrived: Wheel Chair Any new allergies or adverse reactions: No Arrival Time: 12:59 Had a fall or experienced change in No Accompanied By: caregiver activities of daily living that may affect Transfer Assistance: None risk of falls: Patient Identification Verified: Yes Signs or symptoms of abuse/neglect since last visito No Secondary Verification Process Completed: Yes Hospitalized since last visit: Yes Patient Requires Transmission-Based Precautions: No Implantable device outside of the clinic excluding No Patient Has Alerts: No cellular tissue based products placed in the center since last visit: Has Dressing in Place as Prescribed: Yes Pain Present Now: No Electronic Signature(s) Signed: 01/10/2023 12:35:38 PM By: Thayer Dallas Entered By: Thayer Dallas on 01/08/2023 13:00:48 -------------------------------------------------------------------------------- Clinic Level of Care Assessment Details Patient Name: Date of Service: Cindy, Saunders NA 01/08/2023 12:30 PM Medical Record Number: [id number] Patient Account Number: [id number] Date of Birth/Sex: Treating RN: 02-12-1946 (77 y.o. Arta Silence Primary Care Caris Cerveny: Daphine Deutscher, Mary-Margaret Other Clinician: Referring Anquinette Pierro: Treating Latoya Diskin/Extender: Lauretta Grill, Mary-Margaret Weeks in Treatment: 12 Clinic Level of Care Assessment  Items TOOL 4 Quantity Score X- 1 0 Use when only an EandM is performed on FOLLOW-UP visit ASSESSMENTS - Nursing Assessment / Reassessment X- 1 10 Reassessment of Co-morbidities (includes updates in patient status) X- 1 5 Reassessment of Adherence to Treatment Plan ASSESSMENTS - Wound and Skin A ssessment / Reassessment X - Simple Wound Assessment / Reassessment - one wound 1 5 []  - 0 Complex Wound Assessment / Reassessment - multiple wounds []  - 0 Dermatologic / Skin Assessment (not related to wound area) ASSESSMENTS - Focused Assessment []  - 0 Circumferential Edema Measurements - multi extremities []  - 0 Nutritional Assessment / Counseling / Intervention Bryant, [name] ([id number]) [id number] Page 2 of 8 []  - 0 Lower Extremity Assessment (monofilament, tuning fork, pulses) []  - 0 Peripheral Arterial Disease Assessment (using hand held doppler) ASSESSMENTS - Ostomy and/or Continence Assessment and Care []  - 0 Incontinence Assessment and Management []  - 0 Ostomy Care Assessment and Management (repouching, etc.) PROCESS - Coordination of Care X - Simple Patient / Family Education for ongoing care 1 15 []  - 0 Complex (extensive) Patient / Family Education for ongoing care X- 1 10 Staff obtains Chiropractor, Records, T Results / Process Orders est X- 1 10 Staff telephones HHA, Nursing Homes / Clarify orders / etc []  - 0 Routine Transfer to another Facility (non-emergent condition) []  - 0 Routine Hospital Admission (non-emergent condition) []  - 0 New Admissions / Manufacturing engineer / Ordering NPWT Apligraf, etc. , []  - 0 Emergency Hospital Admission (emergent condition) X- 1 10 Simple Discharge Coordination []  - 0 Complex (extensive) Discharge Coordination PROCESS - Special Needs []  - 0 Pediatric / Minor Patient Management []  - 0 Isolation Patient Management []  - 0 Hearing / Language / Visual special needs []  - 0 Assessment of  Community assistance (transportation, D/C planning, etc.) []  - 0 Additional assistance / Altered mentation []  - 0 Support Surface(s) Assessment (bed, cushion, seat, etc.) INTERVENTIONS - Wound Cleansing / Measurement X - Simple Wound Cleansing - one wound 1 5 []  -  0 Complex Wound Cleansing - multiple wounds X- 1 5 Wound Imaging (photographs - any number of wounds) []  - 0 Wound Tracing (instead of photographs) X- 1 5 Simple Wound Measurement - one wound []  - 0 Complex Wound Measurement - multiple wounds INTERVENTIONS - Wound Dressings X - Small Wound Dressing one or multiple wounds 1 10 []  - 0 Medium Wound Dressing one or multiple wounds []  - 0 Large Wound Dressing one or multiple wounds []  - 0 Application of Medications - topical []  - 0 Application of Medications - injection INTERVENTIONS - Miscellaneous []  - 0 External ear exam []  - 0 Specimen Collection (cultures, biopsies, blood, body fluids, etc.) []  - 0 Specimen(s) / Culture(s) sent or taken to Lab for analysis []  - 0 Patient Transfer (multiple staff / Nurse, adult / Similar devices) []  - 0 Simple Staple / Suture removal (25 or less) []  - 0 Complex Staple / Suture removal (26 or more) []  - 0 Hypo / Hyperglycemic Management (close monitor of Blood Glucose) Soave, [name] ([id number]) [id number] Page 3 of 8 []  - 0 Ankle / Brachial Index (ABI) - do not check if billed separately X- 1 5 Vital Signs Has the patient been seen at the hospital within the last three years: Yes Total Score: 95 Level Of Care: New/Established - Level 3 Electronic Signature(s) Signed: 01/08/2023 6:05:41 PM By: Shawn Stall RN, BSN Entered By: Shawn Stall on 01/08/2023 13:38:27 -------------------------------------------------------------------------------- Encounter Discharge Information Details Patient Name: Date of Service: Cindy, Saunders NA 01/08/2023 12:30 PM Medical Record Number: [id number] Patient Account  Number: [id number] Date of Birth/Sex: Treating RN: 03-01-1946 (77 y.o. Arta Silence Primary Care Yoshio Seliga: Daphine Deutscher, Mary-Margaret Other Clinician: Referring Rayjon Wery: Treating Torryn Hudspeth/Extender: Lauretta Grill, Mary-Margaret Weeks in Treatment: 12 Encounter Discharge Information Items Discharge Condition: Stable Ambulatory Status: Wheelchair Discharge Destination: Home Transportation: Private Auto Accompanied By: self Schedule Follow-up Appointment: Yes Clinical Summary of Care: Electronic Signature(s) Signed: 01/08/2023 6:05:41 PM By: Shawn Stall RN, BSN Entered By: Shawn Stall on 01/08/2023 13:38:54 -------------------------------------------------------------------------------- Lower Extremity Assessment Details Patient Name: Date of Service: Cindy, Saunders NA 01/08/2023 12:30 PM Medical Record Number: [id number] Patient Account Number: [id number] Date of Birth/Sex: Treating RN: July 09, 1945 (77 y.o. F) Primary Care Natanael Saladin: Daphine Deutscher, Mary-Margaret Other Clinician: Referring Landon Bassford: Treating Soumya Colson/Extender: Lauretta Grill, Mary-Margaret Weeks in Treatment: 12 Electronic Signature(s) Signed: 01/10/2023 12:35:38 PM By: Thayer Dallas Entered By: Thayer Dallas on 01/08/2023 13:08:17 -------------------------------------------------------------------------------- Multi-Disciplinary Care Plan Details Patient Name: Date of Service: Cindy, Saunders NA 01/08/2023 12:30 PM Medical Record Number: [id number] Patient Account Number: [id number] Cindy, Saunders ([id number]) [id number].pdf Page 4 of 8 Date of Birth/Sex: Treating RN: [birth year] (77 y.o. [name], Millard.Loa Primary Care Keziah Drotar: Other Clinician: Daphine Deutscher, Mary-Margaret Referring Roby Donaway: Treating Terina Mcelhinny/Extender: Lauretta Grill, Mary-Margaret Weeks in Treatment: 12 Multidisciplinary Care Plan reviewed with physician Active Inactive Necrotic Tissue Nursing Diagnoses: Knowledge  deficit related to management of necrotic/devitalized tissue Goals: Necrotic/devitalized tissue will be minimized in the wound bed Date Initiated: 10/16/2022 Target Resolution Date: 01/24/2023 Goal Status: Active Patient/caregiver will verbalize understanding of reason and process for debridement of necrotic tissue Date Initiated: 10/16/2022 Target Resolution Date: 01/24/2023 Goal Status: Active Interventions: Assess patient pain level pre-, during and post procedure and prior to discharge Provide education on necrotic tissue and debridement process Treatment Activities: Apply topical anesthetic as ordered : 10/16/2022 Enzymatic debridement : 10/16/2022 Notes: Pain, Acute or Chronic Nursing Diagnoses: Pain, acute or chronic: actual or potential Potential alteration in comfort, pain Goals: Patient will verbalize  adequate pain control and receive pain control interventions during procedures as needed Date Initiated: 10/16/2022 Target Resolution Date: 01/24/2023 Goal Status: Active Patient/caregiver will verbalize comfort level met Date Initiated: 10/16/2022 Date Inactivated: 12/11/2022 Target Resolution Date: 12/27/2022 Goal Status: Met Interventions: Assess comfort goal upon admission Complete pain assessment as per visit requirements Provide education on pain management Treatment Activities: Administer pain control measures as ordered : 10/16/2022 Notes: Pressure Nursing Diagnoses: Knowledge deficit related to management of pressures ulcers Goals: Patient will remain free of pressure ulcers Date Initiated: 10/16/2022 Target Resolution Date: 01/10/2023 Goal Status: Active Interventions: Assess: immobility, friction, shearing, incontinence upon admission and as needed Assess offloading mechanisms upon admission and as needed Assess potential for pressure ulcer upon admission and as needed Provide education on pressure ulcers Notes: Electronic Signature(s) [place], [name] ([id number])  [id number] Page 5 of 8 Signed: 01/08/2023 6:05:41 PM By: Shawn Stall RN, BSN Entered By: Shawn Stall on 01/08/2023 13:06:47 -------------------------------------------------------------------------------- Pain Assessment Details Patient Name: Date of Service: Cindy, Saunders NA 01/08/2023 12:30 PM Medical Record Number: [id number] Patient Account Number: [id number] Date of Birth/Sex: Treating RN: March 27, 1946 (77 y.o. F) Primary Care Alynn Ellithorpe: Daphine Deutscher, Mary-Margaret Other Clinician: Referring Arlen Dupuis: Treating Othello Sgroi/Extender: Lauretta Grill, Mary-Margaret Weeks in Treatment: 12 Active Problems Location of Pain Severity and Description of Pain Patient Has Paino No Site Locations Pain Management and Medication Current Pain Management: Electronic Signature(s) Signed: 01/10/2023 12:35:38 PM By: Thayer Dallas Entered By: Thayer Dallas on 01/08/2023 13:08:11 -------------------------------------------------------------------------------- Patient/Caregiver Education Details Patient Name: Date of Service: [name] NA 8/14/2024andnbsp12:30 PM Medical Record Number: [id number] Patient Account Number: [id number] Date of Birth/Gender: Treating RN: [birth year] (77 y.o. Arta Silence Primary Care Physician: Daphine Deutscher, Mary-Margaret Other Clinician: Referring Physician: Treating Physician/Extender: Lauretta Grill, Mary-Margaret Weeks in Treatment: 12 Education Assessment Education Provided To: Patient LESHELL, GENGO ([id number]) [id number] Page 6 of 8 Education Topics Provided Wound/Skin Impairment: Handouts: Caring for Your Ulcer Methods: Explain/Verbal Responses: Reinforcements needed Electronic Signature(s) Signed: 01/08/2023 6:05:41 PM By: Shawn Stall RN, BSN Entered By: Shawn Stall on 01/08/2023 13:07:00 -------------------------------------------------------------------------------- Wound Assessment  Details Patient Name: Date of Service: Cindy, Saunders NA 01/08/2023 12:30 PM Medical Record Number: [id number] Patient Account Number: [id number] Date of Birth/Sex: Treating RN: 1945/06/23 (77 y.o. F) Primary Care Verdene Creson: Daphine Deutscher, Mary-Margaret Other Clinician: Referring Adaliz Dobis: Treating Coleen Cardiff/Extender: Lauretta Grill, Mary-Margaret Weeks in Treatment: 12 Wound Status Wound Number: 1 Primary Pressure Ulcer Etiology: Wound Location: Sacrum Wound Open Wounding Event: Pressure Injury Status: Date Acquired: 08/26/2022 Comorbid Chronic Obstructive Pulmonary Disease (COPD), Congestive Weeks Of Treatment: 12 History: Heart Failure, Coronary Artery Disease, Deep Vein Thrombosis, Clustered Wound: No Hypertension, Peripheral Venous Disease, Osteoarthritis, Dementia Photos Wound Measurements Length: (cm) 1.8 Width: (cm) 1.1 Depth: (cm) 0.4 Area: (cm) 1.555 Volume: (cm) 0.622 % Reduction in Area: 91.6% % Reduction in Volume: 98% Epithelialization: None Tunneling: No Undermining: No Wound Description Classification: Category/Stage IV Wound Margin: Thickened Exudate Amount: Medium Exudate Type: Serosanguineous Exudate Color: red, brown Foul Odor After Cleansing: No Slough/Fibrino Yes Wound Bed Granulation Amount: None Present (0%) Exposed Structure Necrotic Amount: Large (67-100%) Fascia Exposed: No Necrotic Quality: Adherent Slough Fat Layer (Subcutaneous Tissue) Exposed: Yes Tendon Exposed: No Muscle Exposed: Yes Necrosis of Muscle: No Joint Exposed: No Bone Exposed: No [name], [name] ([id number]) [id number] Page 7 of 8 Periwound Skin Texture Texture Color No Abnormalities Noted: No No Abnormalities Noted: No Callus: No Atrophie Blanche: No Crepitus: No Cyanosis: No Excoriation: No Ecchymosis: No Induration: No Erythema: No Rash: No Hemosiderin Staining: No  Scarring: No Mottled: No Pallor: No Moisture Rubor: No No  Abnormalities Noted: No Dry / Scaly: No Maceration: No Treatment Notes Wound #1 (Sacrum) Cleanser Wound Cleanser Discharge Instruction: Cleanse the wound with wound cleanser prior to applying a clean dressing using gauze sponges, not tissue or cotton balls. Peri-Wound Care Skin Prep Discharge Instruction: Use skin prep as directed Topical Primary Dressing Fibracol Plus Dressing, 4x4.38 in (collagen) Discharge Instruction: Moisten collagen with saline or hydrogel Secondary Dressing Woven Gauze Sponge, Non-Sterile 4x4 in Discharge Instruction: FOLD INTO A FOURTH (SMALL SQUARE) TO ENSURE THE HYDROFERA BLUE IS TOUCHING DOWN INTO THE WOUND BED. Zetuvit Plus Silicone Border Dressing 4x4 (in/in) Discharge Instruction: Apply silicone border over primary dressing as directed. Secured With Compression Wrap Compression Stockings Facilities manager) Signed: 01/10/2023 12:35:38 PM By: Thayer Dallas Entered By: Thayer Dallas on 01/08/2023 13:16:47 -------------------------------------------------------------------------------- Vitals Details Patient Name: Date of Service: Cindy, Saunders NA 01/08/2023 12:30 PM Medical Record Number: [id number] Patient Account Number: [id number] Date of Birth/Sex: Treating RN: 08/21/1945 (77 y.o. F) Primary Care Vanassa Penniman: Daphine Deutscher, Mary-Margaret Other Clinician: Referring Gaines Cartmell: Treating Myesha Stillion/Extender: Lauretta Grill, Mary-Margaret Weeks in Treatment: 12 Vital Signs Time Taken: 13:07 Pulse (bpm): 55 Height (in): 62 Respiratory Rate (breaths/min): 18 Weight (lbs): 139 Blood Pressure (mmHg): 113/69 Body Mass Index (BMI): 25.4 Reference Range: 80 - 120 mg / dl [place], [name] ([id number]) [id number] Page 8 of 8 Electronic Signature(s) Signed: 01/10/2023 12:35:38 PM By: Thayer Dallas Entered By: Thayer Dallas on 01/08/2023 13:08:05

## 2023-01-11 DIAGNOSIS — M6281 Muscle weakness (generalized): Secondary | ICD-10-CM | POA: Diagnosis not present

## 2023-01-13 DIAGNOSIS — M6281 Muscle weakness (generalized): Secondary | ICD-10-CM | POA: Diagnosis not present

## 2023-01-14 DIAGNOSIS — M6281 Muscle weakness (generalized): Secondary | ICD-10-CM | POA: Diagnosis not present

## 2023-01-15 DIAGNOSIS — L03115 Cellulitis of right lower limb: Secondary | ICD-10-CM | POA: Diagnosis not present

## 2023-01-15 DIAGNOSIS — E039 Hypothyroidism, unspecified: Secondary | ICD-10-CM | POA: Diagnosis not present

## 2023-01-15 DIAGNOSIS — A419 Sepsis, unspecified organism: Secondary | ICD-10-CM | POA: Diagnosis not present

## 2023-01-15 DIAGNOSIS — I890 Lymphedema, not elsewhere classified: Secondary | ICD-10-CM | POA: Diagnosis not present

## 2023-01-15 DIAGNOSIS — D649 Anemia, unspecified: Secondary | ICD-10-CM | POA: Diagnosis not present

## 2023-01-15 DIAGNOSIS — M6281 Muscle weakness (generalized): Secondary | ICD-10-CM | POA: Diagnosis not present

## 2023-01-15 DIAGNOSIS — F411 Generalized anxiety disorder: Secondary | ICD-10-CM | POA: Diagnosis not present

## 2023-01-15 DIAGNOSIS — I5032 Chronic diastolic (congestive) heart failure: Secondary | ICD-10-CM | POA: Diagnosis not present

## 2023-01-15 DIAGNOSIS — I700 Atherosclerosis of aorta: Secondary | ICD-10-CM | POA: Diagnosis not present

## 2023-01-15 DIAGNOSIS — J9601 Acute respiratory failure with hypoxia: Secondary | ICD-10-CM | POA: Diagnosis not present

## 2023-01-15 DIAGNOSIS — K219 Gastro-esophageal reflux disease without esophagitis: Secondary | ICD-10-CM | POA: Diagnosis not present

## 2023-01-15 DIAGNOSIS — M170 Bilateral primary osteoarthritis of knee: Secondary | ICD-10-CM | POA: Diagnosis not present

## 2023-01-15 DIAGNOSIS — I82402 Acute embolism and thrombosis of unspecified deep veins of left lower extremity: Secondary | ICD-10-CM | POA: Diagnosis not present

## 2023-01-16 DIAGNOSIS — M6281 Muscle weakness (generalized): Secondary | ICD-10-CM | POA: Diagnosis not present

## 2023-01-17 DIAGNOSIS — M6281 Muscle weakness (generalized): Secondary | ICD-10-CM | POA: Diagnosis not present

## 2023-01-22 ENCOUNTER — Encounter (HOSPITAL_BASED_OUTPATIENT_CLINIC_OR_DEPARTMENT_OTHER): Payer: Medicare Other | Admitting: Internal Medicine

## 2023-01-22 DIAGNOSIS — I130 Hypertensive heart and chronic kidney disease with heart failure and stage 1 through stage 4 chronic kidney disease, or unspecified chronic kidney disease: Secondary | ICD-10-CM | POA: Diagnosis not present

## 2023-01-22 DIAGNOSIS — N183 Chronic kidney disease, stage 3 unspecified: Secondary | ICD-10-CM | POA: Diagnosis not present

## 2023-01-22 DIAGNOSIS — I480 Paroxysmal atrial fibrillation: Secondary | ICD-10-CM | POA: Diagnosis not present

## 2023-01-22 DIAGNOSIS — J449 Chronic obstructive pulmonary disease, unspecified: Secondary | ICD-10-CM | POA: Diagnosis not present

## 2023-01-22 DIAGNOSIS — F0150 Vascular dementia without behavioral disturbance: Secondary | ICD-10-CM | POA: Diagnosis not present

## 2023-01-22 DIAGNOSIS — L89154 Pressure ulcer of sacral region, stage 4: Secondary | ICD-10-CM | POA: Diagnosis not present

## 2023-01-23 NOTE — Progress Notes (Signed)
NEL, HAVNER ([id number]) [id number] Page 1 of 8 Visit Report for 01/22/2023 Arrival Information Details Patient Name: Date of Service: [name], [name] 01/22/2023 12:45 PM Medical Record Number: [id number] Patient Account Number: [id number] Date of Birth/Sex: Treating RN: 1946/03/06 (77 y.o. F) Primary Care Zeshan Sena: Daphine Deutscher, Mary-Margaret Other Clinician: Referring Trezure Cronk: Treating Orlondo Holycross/Extender: Lorita Officer, Mary-Margaret Weeks in Treatment: 14 Visit Information History Since Last Visit Added or deleted any medications: No Patient Arrived: Ambulatory Any new allergies or adverse reactions: No Arrival Time: 13:06 Had a fall or experienced change in No Accompanied By: self activities of daily living that may affect Transfer Assistance: None risk of falls: Patient Identification Verified: Yes Signs or symptoms of abuse/neglect since last visito No Secondary Verification Process Completed: Yes Hospitalized since last visit: No Patient Requires Transmission-Based Precautions: No Implantable device outside of the clinic excluding No Patient Has Alerts: No cellular tissue based products placed in the center since last visit: Has Dressing in Place as Prescribed: Yes Pain Present Now: No Electronic Signature(s) Signed: 01/22/2023 4:53:59 PM By: Thayer Dallas Entered By: Thayer Dallas on 01/22/2023 13:06:38 -------------------------------------------------------------------------------- Clinic Level of Care Assessment Details Patient Name: Date of Service: [name], [name] 01/22/2023 12:45 PM Medical Record Number: [id number] Patient Account Number: [id number] Date of Birth/Sex: Treating RN: 04-02-1946 (77 y.o. Arta Silence Primary Care Nicolaus Andel: Daphine Deutscher, Mary-Margaret Other Clinician: Referring Jerone Cudmore: Treating Golda Zavalza/Extender: Lorita Officer, Mary-Margaret Weeks in Treatment: 14 Clinic Level of Care Assessment Items TOOL 4  Quantity Score X- 1 0 Use when only an EandM is performed on FOLLOW-UP visit ASSESSMENTS - Nursing Assessment / Reassessment X- 1 10 Reassessment of Co-morbidities (includes updates in patient status) X- 1 5 Reassessment of Adherence to Treatment Plan ASSESSMENTS - Wound and Skin A ssessment / Reassessment X - Simple Wound Assessment / Reassessment - one wound 1 5 []  - 0 Complex Wound Assessment / Reassessment - multiple wounds X- 1 10 Dermatologic / Skin Assessment (not related to wound area) ASSESSMENTS - Focused Assessment []  - 0 Circumferential Edema Measurements - multi extremities []  - 0 Nutritional Assessment / Counseling / Intervention Shiner, [name] ([id number]) [id number] Page 2 of 8 []  - 0 Lower Extremity Assessment (monofilament, tuning fork, pulses) []  - 0 Peripheral Arterial Disease Assessment (using hand held doppler) ASSESSMENTS - Ostomy and/or Continence Assessment and Care []  - 0 Incontinence Assessment and Management []  - 0 Ostomy Care Assessment and Management (repouching, etc.) PROCESS - Coordination of Care X - Simple Patient / Family Education for ongoing care 1 15 []  - 0 Complex (extensive) Patient / Family Education for ongoing care X- 1 10 Staff obtains Chiropractor, Records, T Results / Process Orders est []  - 0 Staff telephones HHA, Nursing Homes / Clarify orders / etc []  - 0 Routine Transfer to another Facility (non-emergent condition) []  - 0 Routine Hospital Admission (non-emergent condition) []  - 0 New Admissions / Manufacturing engineer / Ordering NPWT Apligraf, etc. , []  - 0 Emergency Hospital Admission (emergent condition) X- 1 10 Simple Discharge Coordination []  - 0 Complex (extensive) Discharge Coordination PROCESS - Special Needs []  - 0 Pediatric / Minor Patient Management []  - 0 Isolation Patient Management []  - 0 Hearing / Language / Visual special needs []  - 0 Assessment of Community assistance  (transportation, D/C planning, etc.) []  - 0 Additional assistance / Altered mentation []  - 0 Support Surface(s) Assessment (bed, cushion, seat, etc.) INTERVENTIONS - Wound Cleansing / Measurement X - Simple Wound Cleansing - one wound 1 5 []  - 0  Complex Wound Cleansing - multiple wounds X- 1 5 Wound Imaging (photographs - any number of wounds) []  - 0 Wound Tracing (instead of photographs) X- 1 5 Simple Wound Measurement - one wound []  - 0 Complex Wound Measurement - multiple wounds INTERVENTIONS - Wound Dressings X - Small Wound Dressing one or multiple wounds 1 10 []  - 0 Medium Wound Dressing one or multiple wounds []  - 0 Large Wound Dressing one or multiple wounds []  - 0 Application of Medications - topical []  - 0 Application of Medications - injection INTERVENTIONS - Miscellaneous []  - 0 External ear exam []  - 0 Specimen Collection (cultures, biopsies, blood, body fluids, etc.) []  - 0 Specimen(s) / Culture(s) sent or taken to Lab for analysis []  - 0 Patient Transfer (multiple staff / Nurse, adult / Similar devices) []  - 0 Simple Staple / Suture removal (25 or less) []  - 0 Complex Staple / Suture removal (26 or more) []  - 0 Hypo / Hyperglycemic Management (close monitor of Blood Glucose) Dinkel, [name] ([id number]) [id number] Page 3 of 8 []  - 0 Ankle / Brachial Index (ABI) - do not check if billed separately X- 1 5 Vital Signs Has the patient been seen at the hospital within the last three years: Yes Total Score: 95 Level Of Care: New/Established - Level 3 Electronic Signature(s) Signed: 01/22/2023 6:19:00 PM By: Shawn Stall RN, BSN Entered By: Shawn Stall on 01/22/2023 14:50:14 -------------------------------------------------------------------------------- Lower Extremity Assessment Details Patient Name: Date of Service: [name], [name] 01/22/2023 12:45 PM Medical Record Number: [id number] Patient Account Number: [id number] Date of  Birth/Sex: Treating RN: 18-Nov-1945 (77 y.o. F) Primary Care Anicka Stuckert: Daphine Deutscher, Mary-Margaret Other Clinician: Referring Shemar Plemmons: Treating Don Tiu/Extender: Lorita Officer, Mary-Margaret Weeks in Treatment: 14 Electronic Signature(s) Signed: 01/22/2023 4:53:59 PM By: Thayer Dallas Entered By: Thayer Dallas on 01/22/2023 13:06:59 -------------------------------------------------------------------------------- Multi Wound Chart Details Patient Name: Date of Service: [name], [name] 01/22/2023 12:45 PM Medical Record Number: [id number] Patient Account Number: [id number] Date of Birth/Sex: Treating RN: 03-03-1946 (77 y.o. F) Primary Care Gawain Crombie: Daphine Deutscher, Mary-Margaret Other Clinician: Referring Marlita Keil: Treating Daniqua Campoy/Extender: Lorita Officer, Mary-Margaret Weeks in Treatment: 14 Vital Signs Height(in): 62 Pulse(bpm): 52 Weight(lbs): 139 Blood Pressure(mmHg): 123/74 Body Mass Index(BMI): 25.4 Temperature(F): Respiratory Rate(breaths/min): 16 [1:Photos:] [N/A:N/A] Sacrum N/A N/A Wound Location: Pressure Injury N/A N/A Wounding Event: Pressure Ulcer N/A N/A Primary Etiology: Chronic Obstructive Pulmonary N/A N/A Comorbid History: Disease (COPD), Congestive Heart Failure, Coronary Artery Disease, Deep Vein Thrombosis, Hypertension, Peripheral Venous Disease, Winiarski, [name] ([id number]) [id number] Page 4 of 8 Osteoarthritis, Dementia 08/26/2022 N/A N/A Date Acquired: 76 N/A N/A Weeks of Treatment: Open N/A N/A Wound Status: No N/A N/A Wound Recurrence: 1.2x0.3x0.2 N/A N/A Measurements L x W x D (cm) 0.283 N/A N/A A (cm) : rea 0.057 N/A N/A Volume (cm) : 98.50% N/A N/A % Reduction in A rea: 99.80% N/A N/A % Reduction in Volume: Category/Stage IV N/A N/A Classification: Medium N/A N/A Exudate A mount: Serosanguineous N/A N/A Exudate Type: red, brown N/A N/A Exudate Color: Thickened N/A N/A Wound Margin: Small  (1-33%) N/A N/A Granulation A mount: Red N/A N/A Granulation Quality: Large (67-100%) N/A N/A Necrotic A mount: Fat Layer (Subcutaneous Tissue): Yes N/A N/A Exposed Structures: Muscle: Yes Fascia: No Tendon: No Joint: No Bone: No None N/A N/A Epithelialization: Excoriation: No N/A N/A Periwound Skin Texture: Induration: No Callus: No Crepitus: No Rash: No Scarring: No Maceration: No N/A N/A Periwound Skin Moisture: Dry/Scaly: No Atrophie Blanche: No N/A N/A Periwound Skin Color: Cyanosis: No  Ecchymosis: No Erythema: No Hemosiderin Staining: No Mottled: No Pallor: No Rubor: No Treatment Notes Electronic Signature(s) Signed: 01/22/2023 5:08:25 PM By: Baltazar Najjar MD Entered By: Baltazar Najjar on 01/22/2023 13:42:57 -------------------------------------------------------------------------------- Multi-Disciplinary Care Plan Details Patient Name: Date of Service: [name], [name] 01/22/2023 12:45 PM Medical Record Number: [id number] Patient Account Number: [id number] Date of Birth/Sex: Treating RN: 1946-02-10 (77 y.o. Arta Silence Primary Care Kensli Bowley: Daphine Deutscher, Mary-Margaret Other Clinician: Referring Mckynzi Cammon: Treating Isaak Delmundo/Extender: Lorita Officer, Mary-Margaret Weeks in Treatment: 14 Multidisciplinary Care Plan reviewed with physician Active Inactive Necrotic Tissue Nursing Diagnoses: Knowledge deficit related to management of necrotic/devitalized tissue Goals: Necrotic/devitalized tissue will be minimized in the wound bed Date Initiated: 10/16/2022 Target Resolution Date: 02/14/2023 Goal Status: Active Jennings, [name] ([id number]) [id number] Page 5 of 8 Patient/caregiver will verbalize understanding of reason and process for debridement of necrotic tissue Date Initiated: 10/16/2022 Target Resolution Date: 02/21/2023 Goal Status: Active Interventions: Assess patient pain level pre-, during and post procedure and prior  to discharge Provide education on necrotic tissue and debridement process Treatment Activities: Apply topical anesthetic as ordered : 10/16/2022 Enzymatic debridement : 10/16/2022 Notes: Pain, Acute or Chronic Nursing Diagnoses: Pain, acute or chronic: actual or potential Potential alteration in comfort, pain Goals: Patient will verbalize adequate pain control and receive pain control interventions during procedures as needed Date Initiated: 10/16/2022 Target Resolution Date: 02/21/2023 Goal Status: Active Patient/caregiver will verbalize comfort level met Date Initiated: 10/16/2022 Date Inactivated: 12/11/2022 Target Resolution Date: 12/27/2022 Goal Status: Met Interventions: Assess comfort goal upon admission Complete pain assessment as per visit requirements Provide education on pain management Treatment Activities: Administer pain control measures as ordered : 10/16/2022 Notes: Pressure Nursing Diagnoses: Knowledge deficit related to management of pressures ulcers Goals: Patient will remain free of pressure ulcers Date Initiated: 10/16/2022 Target Resolution Date: 03/27/2023 Goal Status: Active Interventions: Assess: immobility, friction, shearing, incontinence upon admission and as needed Assess offloading mechanisms upon admission and as needed Assess potential for pressure ulcer upon admission and as needed Provide education on pressure ulcers Notes: Electronic Signature(s) Signed: 01/22/2023 6:19:00 PM By: Shawn Stall RN, BSN Entered By: Shawn Stall on 01/22/2023 14:48:18 -------------------------------------------------------------------------------- Pain Assessment Details Patient Name: Date of Service: [name], [name] 01/22/2023 12:45 PM Medical Record Number: [id number] Patient Account Number: [id number] Date of Birth/Sex: Treating RN: [birth year] (77 y.o. F) Primary Care Delinda Malan: Daphine Deutscher, Mary-Margaret Other Clinician: Referring Raechelle Sarti: Treating  Ciaran Begay/Extender: Lorita Officer, Mary-Margaret Weeks in Treatment: [address], [name] ([id number]) [id number] Page 6 of 8 Active Problems Location of Pain Severity and Description of Pain Patient Has Paino No Site Locations Pain Management and Medication Current Pain Management: Electronic Signature(s) Signed: 01/22/2023 4:53:59 PM By: Thayer Dallas Entered By: Thayer Dallas on 01/22/2023 13:06:52 -------------------------------------------------------------------------------- Patient/Caregiver Education Details Patient Name: Date of Service: [name] 8/28/2024andnbsp12:45 PM Medical Record Number: [id number] Patient Account Number: [id number] Date of Birth/Gender: Treating RN: [birth year] (77 y.o. Arta Silence Primary Care Physician: Daphine Deutscher, Mary-Margaret Other Clinician: Referring Physician: Treating Physician/Extender: Lorita Officer, Mary-Margaret Weeks in Treatment: 14 Education Assessment Education Provided To: Patient Education Topics Provided Wound/Skin Impairment: Handouts: Caring for Your Ulcer Methods: Explain/Verbal Responses: Reinforcements needed Electronic Signature(s) Signed: 01/22/2023 6:19:00 PM By: Shawn Stall RN, BSN Entered By: Shawn Stall on 01/22/2023 14:49:08 Echevarria, [name] ([id number]) [id number] Page 7 of 8 -------------------------------------------------------------------------------- Wound Assessment Details Patient Name: Date of Service: [name], [name] 01/22/2023 12:45 PM Medical Record Number: [id number] Patient Account Number: [id number] Date of Birth/Sex: Treating RN: 11-Jun-1945 (77 y.o. F) Primary Care  Jerri Hargadon: Daphine Deutscher, Mary-Margaret Other Clinician: Referring Paige Monarrez: Treating Deloris Mittag/Extender: Lorita Officer, Mary-Margaret Weeks in Treatment: 14 Wound Status Wound Number: 1 Primary Pressure Ulcer Etiology: Wound Location: Sacrum Wound  Open Wounding Event: Pressure Injury Status: Date Acquired: 08/26/2022 Comorbid Chronic Obstructive Pulmonary Disease (COPD), Congestive Heart Weeks Of Treatment: 14 History: Failure, Coronary Artery Disease, Deep Vein Thrombosis, Clustered Wound: No Hypertension, Peripheral Venous Disease, Osteoarthritis, Dementia Photos Wound Measurements Length: (cm) 1.2 Width: (cm) 0.3 Depth: (cm) 0.2 Area: (cm) 0.283 Volume: (cm) 0.057 % Reduction in Area: 98.5% % Reduction in Volume: 99.8% Epithelialization: None Tunneling: No Undermining: No Wound Description Classification: Category/Stage IV Wound Margin: Thickened Exudate Amount: Medium Exudate Type: Serosanguineous Exudate Color: red, brown Foul Odor After Cleansing: No Slough/Fibrino Yes Wound Bed Granulation Amount: Small (1-33%) Exposed Structure Granulation Quality: Red Fascia Exposed: No Necrotic Amount: Large (67-100%) Fat Layer (Subcutaneous Tissue) Exposed: Yes Necrotic Quality: Adherent Slough Tendon Exposed: No Muscle Exposed: Yes Necrosis of Muscle: No Joint Exposed: No Bone Exposed: No Periwound Skin Texture Texture Color No Abnormalities Noted: No No Abnormalities Noted: No Callus: No Atrophie Blanche: No Crepitus: No Cyanosis: No Excoriation: No Ecchymosis: No Induration: No Erythema: No Rash: No Hemosiderin Staining: No Scarring: No Mottled: No Pallor: No Moisture Rubor: No No Abnormalities Noted: No Dry / Scaly: No Maceration: No Electronic Signature(s) Signed: 01/22/2023 4:53:59 PM By: Thayer Dallas Entered By: Thayer Dallas on 01/22/2023 13:15:31 Lozon, [name] ([id number]) [id number] Page 8 of 8 -------------------------------------------------------------------------------- Vitals Details Patient Name: Date of Service: [name], [name] 01/22/2023 12:45 PM Medical Record Number: [id number] Patient Account Number: [id number] Date of Birth/Sex: Treating RN: [birth year]  (77 y.o. F) Primary Care Rozanna Cormany: Daphine Deutscher, Mary-Margaret Other Clinician: Referring Alexiah Koroma: Treating Antoniette Peake/Extender: Lorita Officer, Mary-Margaret Weeks in Treatment: 14 Vital Signs Time Taken: 13:09 Pulse (bpm): 52 Height (in): 62 Respiratory Rate (breaths/min): 16 Weight (lbs): 139 Blood Pressure (mmHg): 123/74 Body Mass Index (BMI): 25.4 Reference Range: 80 - 120 mg / dl Electronic Signature(s) Signed: 01/22/2023 4:53:59 PM By: Thayer Dallas Entered By: Thayer Dallas on 01/22/2023 13:09:52

## 2023-01-23 NOTE — Progress Notes (Signed)
MELEA, CLOER ([id number]) [id number] Page 1 of 6 Visit Report for 01/22/2023 HPI Details Patient Name: Date of Service: [name], [name] 01/22/2023 12:45 PM Medical Record Number: [id number] Patient Account Number: [id number] Date of Birth/Sex: Treating RN: [birth year] (77 y.o. F) Primary Care Provider: Daphine Deutscher, Mary-Margaret Other Clinician: Referring Provider: Treating Provider/Extender: Lorita Officer, Mary-Margaret Weeks in Treatment: 14 History of Present Illness HPI Description: 10-16-2022 upon evaluation today patient appears to be doing somewhat poorly in regard to a wound in the sacral area/lower back region. Fortunately there does not appear to be any signs of infection at this point which is great news. With that being said she unfortunately did develop this wound as a result of having had a hospital stay which dates back to looks like at least the beginning of April where there was unstageable wound noted. It was between May 2 and 7 that she fell and broke her shoulder and was put in a sling that she had C. difficile infection and subsequently this is when the wound was mostly described. Fortunately there does not appear to be any signs of infection locally or systemically which is great news but at the same time she does have a significantly deep wound that is of concern at this point. That is the reason she comes in for evaluation to see if there is anything we can offer or recommend to get this healed faster. Patient does have a history of COPD, hypertension, chronic kidney disease stage III, atrial fibrillation, and vascular dementia although there is no behavioral disturbance that I see or perceived that she seems to be answering questions appropriately and asking good questions to be honest. 10-30-2022 upon evaluation today patient appears to be doing well currently in regard to her wound which appears to be healthy at this point. I do not see  any signs of active infection locally nor systemically which is great news. No fevers, chills, nausea, vomiting, or diarrhea. With that being said I am actually not seeing much improvement compared to last time I saw her simply due to the fact that she has not had the wound VAC on she was seen on the 22nd and then subsequently ended up being admitted to the hospital on the 25th due to a blood clot in her leg. This ended with therapy in the hospital through what appears to be the third so she is going to back to the facility for 2 days before coming in today and then subsequently the wound VAC was not used upon readmission. We did have a letter from the treatment nurse that stated the patient transfers independently multiple times throughout the day. Obviously based on the weakness and what I am seeing it took to individuals my nurse and the individual with her from the facility to move her into the bed today safely. I do not think she needs to be transferring independently and I think she may need a sitter in fact we might even need to do this long-term while she is on the wound VAC in order to ensure that she does not try to get up on her own and in the process hurt herself. Fortunately I do not see any signs of active infection locally nor systemically which is great news. I do still think a wound VAC would be the primary best way to get this closed as quickly as possible. 11-13-2022 upon evaluation patient appears to be doing better in regard to her wounds from a size perspective although are  still having a lot of issues here with the facility putting on the wound VAC appropriately. They continue to not really do this appropriately which has been the primary concern. In fact because of that it is not really functioning properly and continues to not do as well as we really think it should be. Nonetheless we will going to see about having [employer] go out and do some training with regard to placement of wound  vacs. 11-20-2022 upon evaluation today patient appears to be doing well currently in regard to her wound. She is actually showing signs of improvement I am actually very pleased with where things stand I do believe that wound VAC is doing a good job here. Fortunately I do not see any evidence of active infection locally or systemically which is great news. No fevers, chills, nausea, vomiting, or diarrhea. 11-27-2022 upon evaluation patient's wound is doing really about the same. I do not think it is any worse also do not think it significantly better at this point. I do think that she would benefit from a continuation of therapy with regard to the wound VAC but unfortunately this just does not seem to be staying in place which is unfortunate. 12-11-2022 upon evaluation patient's wound unfortunately does not appear to be doing quite as well today as I was hoping for. It does appear that she has some evidence of breakdown as far as the dressing not staying quite in place. I think that she needs to have this more appropriately secured with some bolster behind to help hold the dressing in place. The patient voiced understanding. Subsequently I do believe that we are making good headway towards complete closure which is good news but at the same time we need to make sure that the dressing is maintaining contact with the base of the wound otherwise when I can see the improvement we want. 12-25-2022 upon evaluation today patient appears to be doing well currently in regard to her wound. She has been tolerating the dressing changes without complication. Fortunately there does not appear to be any signs of active infection at this time. No fevers, chills, nausea, vomiting, or diarrhea. 01-08-2023 upon evaluation today patient appears to be doing well currently in regard to her wound. Fortunately there does not appear to be any signs of active infection at this time which is great news and in general I do believe  that we are making headway towards complete closure. With that being said I think that we may want to switch over to a collagen dressing at this point. 8/28; this is a patient with a small wound on the lower sacrum that has been improving but quite a marked rate. We have been using silver collagen. She is from [facility] nursing home. Electronic Signature(s) Signed: 01/22/2023 5:08:25 PM By: Baltazar Najjar MD Entered By: Baltazar Najjar on 01/22/2023 13:43:38 Guion, [name] ([id number]) [id number] Page 2 of 6 -------------------------------------------------------------------------------- Physical Exam Details Patient Name: Date of Service: IDORA, GIDEON Saunders 01/22/2023 12:45 PM Medical Record Number: [id number] Patient Account Number: [id number] Date of Birth/Sex: Treating RN: 11-30-1945 (77 y.o. F) Primary Care Provider: Daphine Deutscher, Mary-Margaret Other Clinician: Referring Provider: Treating Provider/Extender: Lorita Officer, Mary-Margaret Weeks in Treatment: 14 Notes Wound exam; small wound in the middle of scar tissue. Clean healthy looking granulation slight amount of slough to washed off with gauze and saline. Electronic Signature(s) Signed: 01/22/2023 5:08:25 PM By: Baltazar Najjar MD Entered By: Baltazar Najjar on 01/22/2023 13:45:54 -------------------------------------------------------------------------------- Physician Orders Details Patient Name:  Date of Service: MARTIKA, SANGIACOMO Saunders 01/22/2023 12:45 PM Medical Record Number: [id number] Patient Account Number: [id number] Date of Birth/Sex: Treating RN: 04-Apr-1946 (77 y.o. Katrinka Blazing Primary Care Provider: Daphine Deutscher, Mary-Margaret Other Clinician: Referring Provider: Treating Provider/Extender: Lorita Officer, Mary-Margaret Weeks in Treatment: [id number] Verbal / Phone Orders: No Diagnosis Coding ICD-10 Coding Code Description L89.154 Pressure ulcer of sacral region, stage 4 J44.9 Chronic  obstructive pulmonary disease, unspecified I10 Essential (primary) hypertension N18.30 Chronic kidney disease, stage 3 unspecified I48.0 Paroxysmal atrial fibrillation F01.A0 Vascular dementia, mild, without behavioral disturbance, psychotic disturbance, mood disturbance, and anxiety Follow-up Appointments ppointment in 2 weeks. Leonard Schwartz Wednesday Room 8 ( 01/22/23 Dr. Leanord Hawking covering) 02/05/2023 at 1245 Return A Return appointment in 1 month. Leonard Schwartz Wednesday ask front desk for an appointment Other: - Primary dressing change to collagen. Change x3 a week. Anesthetic (In clinic) Topical Lidocaine 4% applied to wound bed Off-Loading Low air-loss mattress (Group 2) - facility to ensure patient is using a group 2 air mattress. Turn and reposition every 2 hours Other: - continue using wheelchair cushion when in wheelchair. patient to minimize being up in wheelchair. 2 hours in the chair then 1 hour in the bed. Additional Orders / Instructions Follow Nutritious Diet - increase protein to aid in wound healing. Juven Shake 1-2 times daily. Wound Treatment Wound #1 - Sacrum Cleanser: Wound Cleanser 3 x Per Week/30 Days Discharge Instructions: Cleanse the wound with wound cleanser prior to applying a clean dressing using gauze sponges, not tissue or cotton balls. REHAM, BLACKWATER ([id number]) [id number] Page 3 of 6 Peri-Wound Care: Skin Prep 3 x Per Week/30 Days Discharge Instructions: Use skin prep as directed Prim Dressing: Fibracol Plus Dressing, 4x4.38 in (collagen) 3 x Per Week/30 Days ary Discharge Instructions: Moisten collagen with saline or hydrogel Secondary Dressing: Woven Gauze Sponge, Non-Sterile 4x4 in 3 x Per Week/30 Days Discharge Instructions: FOLD INTO A FOURTH (SMALL SQUARE) TO ENSURE THE HYDROFERA BLUE IS TOUCHING DOWN INTO THE WOUND BED. Secondary Dressing: Zetuvit Plus Silicone Border Dressing 4x4 (in/in) 3 x Per Week/30 Days Discharge Instructions:  Apply silicone border over primary dressing as directed. Electronic Signature(s) Signed: 01/22/2023 5:08:25 PM By: Baltazar Najjar MD Signed: 01/22/2023 5:10:10 PM By: Karie Schwalbe RN Entered By: Karie Schwalbe on 01/22/2023 13:38:50 -------------------------------------------------------------------------------- Problem List Details Patient Name: Date of Service: JULEIGH, KAPLA Saunders 01/22/2023 12:45 PM Medical Record Number: [id number] Patient Account Number: [id number] Date of Birth/Sex: Treating RN: [birth year] (77 y.o. Arta Silence Primary Care Provider: Daphine Deutscher, Mary-Margaret Other Clinician: Referring Provider: Treating Provider/Extender: Lorita Officer, Mary-Margaret Weeks in Treatment: 14 Active Problems ICD-10 Encounter Code Description Active Date MDM Diagnosis L89.154 Pressure ulcer of sacral region, stage 4 10/16/2022 No Yes J44.9 Chronic obstructive pulmonary disease, unspecified 10/16/2022 No Yes I10 Essential (primary) hypertension 10/16/2022 No Yes N18.30 Chronic kidney disease, stage 3 unspecified 10/16/2022 No Yes I48.0 Paroxysmal atrial fibrillation 10/16/2022 No Yes F01.A0 Vascular dementia, mild, without behavioral disturbance, psychotic 10/16/2022 No Yes disturbance, mood disturbance, and anxiety Inactive Problems Resolved Problems Electronic Signature(s) Signed: 01/22/2023 5:08:25 PM By: Baltazar Najjar MD Havre, [name] ([id number]) [id number] Page 4 of 6 Entered By: Baltazar Najjar on 01/22/2023 13:42:52 -------------------------------------------------------------------------------- Progress Note Details Patient Name: Date of Service: ALLEXUS, KLETTE Saunders 01/22/2023 12:45 PM Medical Record Number: [id number] Patient Account Number: [id number] Date of Birth/Sex: Treating RN: [birth year] (77 y.o. F) Primary Care Provider: Daphine Deutscher, Mary-Margaret Other Clinician: Referring Provider: Treating Provider/Extender: Lorita Officer,  Mary-Margaret Weeks in Treatment: 14 Subjective History of  Present Illness (HPI) 10-16-2022 upon evaluation today patient appears to be doing somewhat poorly in regard to a wound in the sacral area/lower back region. Fortunately there does not appear to be any signs of infection at this point which is great news. With that being said she unfortunately did develop this wound as a result of having had a hospital stay which dates back to looks like at least the beginning of April where there was unstageable wound noted. It was between May 2 and 7 that she fell and broke her shoulder and was put in a sling that she had C. difficile infection and subsequently this is when the wound was mostly described. Fortunately there does not appear to be any signs of infection locally or systemically which is great news but at the same time she does have a significantly deep wound that is of concern at this point. That is the reason she comes in for evaluation to see if there is anything we can offer or recommend to get this healed faster. Patient does have a history of COPD, hypertension, chronic kidney disease stage III, atrial fibrillation, and vascular dementia although there is no behavioral disturbance that I see or perceived that she seems to be answering questions appropriately and asking good questions to be honest. 10-30-2022 upon evaluation today patient appears to be doing well currently in regard to her wound which appears to be healthy at this point. I do not see any signs of active infection locally nor systemically which is great news. No fevers, chills, nausea, vomiting, or diarrhea. With that being said I am actually not seeing much improvement compared to last time I saw her simply due to the fact that she has not had the wound VAC on she was seen on the 22nd and then subsequently ended up being admitted to the hospital on the 25th due to a blood clot in her leg. This ended with therapy in the  hospital through what appears to be the third so she is going to back to the facility for 2 days before coming in today and then subsequently the wound VAC was not used upon readmission. We did have a letter from the treatment nurse that stated the patient transfers independently multiple times throughout the day. Obviously based on the weakness and what I am seeing it took to individuals my nurse and the individual with her from the facility to move her into the bed today safely. I do not think she needs to be transferring independently and I think she may need a sitter in fact we might even need to do this long-term while she is on the wound VAC in order to ensure that she does not try to get up on her own and in the process hurt herself. Fortunately I do not see any signs of active infection locally nor systemically which is great news. I do still think a wound VAC would be the primary best way to get this closed as quickly as possible. 11-13-2022 upon evaluation patient appears to be doing better in regard to her wounds from a size perspective although are still having a lot of issues here with the facility putting on the wound VAC appropriately. They continue to not really do this appropriately which has been the primary concern. In fact because of that it is not really functioning properly and continues to not do as well as we really think it should be. Nonetheless we will going to see about having [employer]  go out and do some training with regard to placement of wound vacs. 11-20-2022 upon evaluation today patient appears to be doing well currently in regard to her wound. She is actually showing signs of improvement I am actually very pleased with where things stand I do believe that wound VAC is doing a good job here. Fortunately I do not see any evidence of active infection locally or systemically which is great news. No fevers, chills, nausea, vomiting, or diarrhea. 11-27-2022 upon evaluation patient's  wound is doing really about the same. I do not think it is any worse also do not think it significantly better at this point. I do think that she would benefit from a continuation of therapy with regard to the wound VAC but unfortunately this just does not seem to be staying in place which is unfortunate. 12-11-2022 upon evaluation patient's wound unfortunately does not appear to be doing quite as well today as I was hoping for. It does appear that she has some evidence of breakdown as far as the dressing not staying quite in place. I think that she needs to have this more appropriately secured with some bolster behind to help hold the dressing in place. The patient voiced understanding. Subsequently I do believe that we are making good headway towards complete closure which is good news but at the same time we need to make sure that the dressing is maintaining contact with the base of the wound otherwise when I can see the improvement we want. 12-25-2022 upon evaluation today patient appears to be doing well currently in regard to her wound. She has been tolerating the dressing changes without complication. Fortunately there does not appear to be any signs of active infection at this time. No fevers, chills, nausea, vomiting, or diarrhea. 01-08-2023 upon evaluation today patient appears to be doing well currently in regard to her wound. Fortunately there does not appear to be any signs of active infection at this time which is great news and in general I do believe that we are making headway towards complete closure. With that being said I think that we may want to switch over to a collagen dressing at this point. 8/28; this is a patient with a small wound on the lower sacrum that has been improving but quite a marked rate. We have been using silver collagen. She is from [facility] nursing home. Objective Constitutional Vitals Time Taken: 1:09 PM, Height: 62 in, Weight: 139 lbs, BMI: 25.4, Pulse: 52  bpm, Respiratory Rate: 16 breaths/min, Blood Pressure: 123/74 mmHg. CLIDE, ZENGEL ([id number]) [id number] Page 5 of 6 Integumentary (Hair, Skin) Wound #1 status is Open. Original cause of wound was Pressure Injury. The date acquired was: 08/26/2022. The wound has been in treatment 14 weeks. The wound is located on the Sacrum. The wound measures 1.2cm length x 0.3cm width x 0.2cm depth; 0.283cm^2 area and 0.057cm^3 volume. There is muscle and Fat Layer (Subcutaneous Tissue) exposed. There is no tunneling or undermining noted. There is a medium amount of serosanguineous drainage noted. The wound margin is thickened. There is small (1-33%) red granulation within the wound bed. There is a large (67-100%) amount of necrotic tissue within the wound bed including Adherent Slough. The periwound skin appearance did not exhibit: Callus, Crepitus, Excoriation, Induration, Rash, Scarring, Dry/Scaly, Maceration, Atrophie Blanche, Cyanosis, Ecchymosis, Hemosiderin Staining, Mottled, Pallor, Rubor, Erythema. Assessment Active Problems ICD-10 Pressure ulcer of sacral region, stage 4 Chronic obstructive pulmonary disease, unspecified Essential (primary) hypertension Chronic kidney disease, stage  3 unspecified Paroxysmal atrial fibrillation Vascular dementia, mild, without behavioral disturbance, psychotic disturbance, mood disturbance, and anxiety Plan Follow-up Appointments: Return Appointment in 2 weeks. Leonard Schwartz Wednesday Room 8 ( 01/22/23 Dr. Leanord Hawking covering) 02/05/2023 at 1245 Return appointment in 1 month. Leonard Schwartz Wednesday ask front desk for an appointment Other: - Primary dressing change to collagen. Change x3 a week. Anesthetic: (In clinic) Topical Lidocaine 4% applied to wound bed Off-Loading: Low air-loss mattress (Group 2) - facility to ensure patient is using a group 2 air mattress. Turn and reposition every 2 hours Other: - continue using wheelchair cushion when in  wheelchair. patient to minimize being up in wheelchair. 2 hours in the chair then 1 hour in the bed. Additional Orders / Instructions: Follow Nutritious Diet - increase protein to aid in wound healing. Juven Shake 1-2 times daily. WOUND #1: - Sacrum Wound Laterality: Cleanser: Wound Cleanser 3 x Per Week/30 Days Discharge Instructions: Cleanse the wound with wound cleanser prior to applying a clean dressing using gauze sponges, not tissue or cotton balls. Peri-Wound Care: Skin Prep 3 x Per Week/30 Days Discharge Instructions: Use skin prep as directed Prim Dressing: Fibracol Plus Dressing, 4x4.38 in (collagen) 3 x Per Week/30 Days ary Discharge Instructions: Moisten collagen with saline or hydrogel Secondary Dressing: Woven Gauze Sponge, Non-Sterile 4x4 in 3 x Per Week/30 Days Discharge Instructions: FOLD INTO A FOURTH (SMALL SQUARE) TO ENSURE THE HYDROFERA BLUE IS TOUCHING DOWN INTO THE WOUND BED. Secondary Dressing: Zetuvit Plus Silicone Border Dressing 4x4 (in/in) 3 x Per Week/30 Days Discharge Instructions: Apply silicone border over primary dressing as directed. 1. We are using collagen based dressings here. 2. The patient is doing very well nice healthy looking wound which is much smaller. 3. Follow-up in 2 weeks orders written to the facility Electronic Signature(s) Signed: 01/22/2023 5:08:25 PM By: Baltazar Najjar MD Entered By: Baltazar Najjar on 01/22/2023 13:46:50 -------------------------------------------------------------------------------- SuperBill Details Patient Name: Date of Service: LUMEN, MCCLARD Saunders 01/22/2023 Medical Record Number: [id number] Patient Account Number: [id number] Date of Birth/Sex: Treating RN: 29-Sep-1945 (77 y.o. F) Primary Care Provider: Daphine Deutscher, Mary-Margaret Other Clinician: Referring Provider: Treating Provider/Extender: Lorita Officer, Mary-Margaret Weeks in Treatment: [address], [name] ([id number]) [id number] Page  6 of 6 Diagnosis Coding ICD-10 Codes Code Description L89.154 Pressure ulcer of sacral region, stage 4 J44.9 Chronic obstructive pulmonary disease, unspecified I10 Essential (primary) hypertension N18.30 Chronic kidney disease, stage 3 unspecified I48.0 Paroxysmal atrial fibrillation F01.A0 Vascular dementia, mild, without behavioral disturbance, psychotic disturbance, mood disturbance, and anxiety Facility Procedures : CPT4 Code: [id number] Description: 99213 - WOUND CARE VISIT-LEV 3 EST PT Modifier: Quantity: 1 Physician Procedures : CPT4 Code Description Modifier [id number] - WC PHYS LEVEL 3 - EST PT ICD-10 Diagnosis Description L89.154 Pressure ulcer of sacral region, stage 4 Quantity: 1 Electronic Signature(s) Signed: 01/22/2023 5:08:25 PM By: Baltazar Najjar MD Signed: 01/22/2023 6:19:00 PM By: Shawn Stall RN, BSN Entered By: Shawn Stall on 01/22/2023 14:50:24

## 2023-02-05 ENCOUNTER — Encounter (HOSPITAL_BASED_OUTPATIENT_CLINIC_OR_DEPARTMENT_OTHER): Payer: Medicare Other | Attending: Physician Assistant | Admitting: Physician Assistant

## 2023-02-05 DIAGNOSIS — N183 Chronic kidney disease, stage 3 unspecified: Secondary | ICD-10-CM | POA: Diagnosis not present

## 2023-02-05 DIAGNOSIS — I509 Heart failure, unspecified: Secondary | ICD-10-CM | POA: Insufficient documentation

## 2023-02-05 DIAGNOSIS — I480 Paroxysmal atrial fibrillation: Secondary | ICD-10-CM | POA: Insufficient documentation

## 2023-02-05 DIAGNOSIS — L89154 Pressure ulcer of sacral region, stage 4: Secondary | ICD-10-CM | POA: Diagnosis not present

## 2023-02-05 DIAGNOSIS — I130 Hypertensive heart and chronic kidney disease with heart failure and stage 1 through stage 4 chronic kidney disease, or unspecified chronic kidney disease: Secondary | ICD-10-CM | POA: Insufficient documentation

## 2023-02-05 DIAGNOSIS — I2510 Atherosclerotic heart disease of native coronary artery without angina pectoris: Secondary | ICD-10-CM | POA: Insufficient documentation

## 2023-02-06 NOTE — Progress Notes (Signed)
[name] ([id number]) [id number] Page 1 of 6 Visit Report for 02/05/2023 Arrival Information Details Patient Name: Date of Service: [name] NA 02/05/2023 12:45 PM Medical Record Number: [id number] Patient Account Number: [id number] Date of Birth/Sex: Treating RN: April 26, 1946 (77 y.o. [name], [name] Primary Care Korver Graybeal: Daphine Deutscher, Mary-Margaret Other Clinician: Referring Merina Behrendt: Treating Khylah Kendra/Extender: Lauretta Grill, Mary-Margaret Weeks in Treatment: 16 Visit Information History Since Last Visit Added or deleted any medications: No Patient Arrived: Wheel Chair Any new allergies or adverse reactions: No Arrival Time: 12:46 Had a fall or experienced change in No Accompanied By: caregiver activities of daily living that may affect Transfer Assistance: Manual risk of falls: Patient Identification Verified: Yes Signs or symptoms of abuse/neglect since last visito No Secondary Verification Process Completed: Yes Hospitalized since last visit: No Patient Requires Transmission-Based Precautions: No Implantable device outside of the clinic excluding No Patient Has Alerts: No cellular tissue based products placed in the center since last visit: Has Dressing in Place as Prescribed: Yes Pain Present Now: No Electronic Signature(s) Signed: 02/05/2023 5:54:20 PM By: Shawn Stall RN, BSN Entered By: Shawn Stall on 02/05/2023 09:46:59 -------------------------------------------------------------------------------- Encounter Discharge Information Details Patient Name: Date of Service: [name] NA 02/05/2023 12:45 PM Medical Record Number: [id number] Patient Account Number: [id number] Date of Birth/Sex: Treating RN: 09-Mar-1946 (77 y.o. [name] Primary Care Zeya Balles: Daphine Deutscher, Mary-Margaret Other Clinician: Referring Gracyn Allor: Treating Wendelin Reader/Extender: Lauretta Grill, Mary-Margaret Weeks in Treatment: [phone] Encounter Discharge  Information Items Post Procedure Vitals Discharge Condition: Stable Temperature (F): 97.9 Ambulatory Status: Wheelchair Pulse (bpm): 55 Discharge Destination: Skilled Nursing Facility Respiratory Rate (breaths/min): 20 Telephoned: No Blood Pressure (mmHg): 160/90 Orders Sent: Yes Transportation: Private Auto Accompanied By: caregiver Schedule Follow-up Appointment: Yes Clinical Summary of Care: Electronic Signature(s) Signed: 02/05/2023 5:54:20 PM By: Shawn Stall RN, BSN Entered By: Shawn Stall on 02/05/2023 10:03:07 [name] ([id number]) [id number] Page 2 of 6 -------------------------------------------------------------------------------- Lower Extremity Assessment Details Patient Name: Date of Service: [name] NA 02/05/2023 12:45 PM Medical Record Number: [id number] Patient Account Number: [id number] Date of Birth/Sex: Treating RN: [birth year] (77 y.o. [name] Primary Care Dorothea Yow: Daphine Deutscher, Mary-Margaret Other Clinician: Referring Ayriana Wix: Treating Avayah Raffety/Extender: Lauretta Grill, Mary-Margaret Weeks in Treatment: 16 Electronic Signature(s) Signed: 02/05/2023 5:54:20 PM By: Shawn Stall RN, BSN Entered By: Shawn Stall on 02/05/2023 09:47:25 -------------------------------------------------------------------------------- Multi-Disciplinary Care Plan Details Patient Name: Date of Service: [name] NA 02/05/2023 12:45 PM Medical Record Number: [id number] Patient Account Number: [id number] Date of Birth/Sex: Treating RN: 1945/08/09 (77 y.o. [name], [name] Primary Care Jeane Cashatt: Daphine Deutscher, Mary-Margaret Other Clinician: Referring Nekeisha Aure: Treating Kirbi Farrugia/Extender: Lauretta Grill, Mary-Margaret Weeks in Treatment: 16 Multidisciplinary Care Plan reviewed with physician Active Inactive Necrotic Tissue Nursing Diagnoses: Knowledge deficit related to management of necrotic/devitalized  tissue Goals: Necrotic/devitalized tissue will be minimized in the wound bed Date Initiated: 10/16/2022 Target Resolution Date: 02/21/2023 Goal Status: Active Patient/caregiver will verbalize understanding of reason and process for debridement of necrotic tissue Date Initiated: 10/16/2022 Target Resolution Date: 02/21/2023 Goal Status: Active Interventions: Assess patient pain level pre-, during and post procedure and prior to discharge Provide education on necrotic tissue and debridement process Treatment Activities: Apply topical anesthetic as ordered : 10/16/2022 Enzymatic debridement : 10/16/2022 Notes: Pressure Nursing Diagnoses: Knowledge deficit related to management of pressures ulcers Goals: Patient will remain free of pressure ulcers Date Initiated: 10/16/2022 Target Resolution Date: 03/27/2023 Goal Status: Active Interventions: Assess: immobility, friction, shearing, incontinence upon admission and as needed [name], [name] ([id number]) [phone].pdf  Page 3 of 6 Assess offloading mechanisms upon admission and as needed Assess potential for pressure ulcer upon admission and as needed Provide education on pressure ulcers Notes: Electronic Signature(s) Signed: 02/05/2023 5:54:20 PM By: Shawn Stall RN, BSN Entered By: Shawn Stall on 02/05/2023 09:56:46 -------------------------------------------------------------------------------- Pain Assessment Details Patient Name: Date of Service: [name] NA 02/05/2023 12:45 PM Medical Record Number: [id number] Patient Account Number: [id number] Date of Birth/Sex: Treating RN: February 24, 1946 (77 y.o. [name] Primary Care Dara Camargo: Daphine Deutscher, Mary-Margaret Other Clinician: Referring Elia Keenum: Treating Bessy Reaney/Extender: Lauretta Grill, Mary-Margaret Weeks in Treatment: 16 Active Problems Location of Pain Severity and Description of Pain Patient Has Paino No Site Locations Pain Management and  Medication Current Pain Management: Electronic Signature(s) Signed: 02/05/2023 5:54:20 PM By: Shawn Stall RN, BSN Entered By: Shawn Stall on 02/05/2023 09:47:19 -------------------------------------------------------------------------------- Patient/Caregiver Education Details Patient Name: Date of Service: [name] NA 9/11/2024andnbsp12:45 PM Medical Record Number: [id number] Patient Account Number: [id number] Date of Birth/Gender: Treating RN: 10/15/1945 (77 y.o. [name] Primary Care Physician: Daphine Deutscher, Mary-Margaret Other Clinician: Referring Physician: Treating Physician/Extender: Lauretta Grill, Mary-Margaret Weeks in Treatment: [address] [name] ([id number]) [id number] Page 4 of 6 Education Assessment Education Provided To: Patient Education Topics Provided Wound/Skin Impairment: Handouts: Caring for Your Ulcer Methods: Explain/Verbal Responses: Reinforcements needed Electronic Signature(s) Signed: 02/05/2023 5:54:20 PM By: Shawn Stall RN, BSN Entered By: Shawn Stall on 02/05/2023 09:58:03 -------------------------------------------------------------------------------- Wound Assessment Details Patient Name: Date of Service: VERDELLA, STOEN NA 02/05/2023 12:45 PM Medical Record Number: [id number] Patient Account Number: [id number] Date of Birth/Sex: Treating RN: August 12, 1945 (77 y.o. [name], [name] Primary Care Marquia Costello: Daphine Deutscher, Mary-Margaret Other Clinician: Referring Trenia Tennyson: Treating Janel Beane/Extender: Lauretta Grill, Mary-Margaret Weeks in Treatment: 16 Wound Status Wound Number: 1 Primary Pressure Ulcer Etiology: Wound Location: Sacrum Wound Open Wounding Event: Pressure Injury Status: Date Acquired: 08/26/2022 Comorbid Chronic Obstructive Pulmonary Disease (COPD), Congestive Weeks Of Treatment: 16 History: Heart Failure, Coronary Artery Disease, Deep Vein Thrombosis, Clustered Wound: No Hypertension,  Peripheral Venous Disease, Osteoarthritis, Dementia Photos Wound Measurements Length: (cm) 1 Width: (cm) 0.3 Depth: (cm) 0.2 Area: (cm) 0.236 Volume: (cm) 0.047 % Reduction in Area: 98.7% % Reduction in Volume: 99.9% Epithelialization: Large (67-100%) Tunneling: No Undermining: No Wound Description Classification: Category/Stage IV Wound Margin: Thickened Exudate Amount: Medium Exudate Type: Serosanguineous Exudate Color: red, brown Foul Odor After Cleansing: No Slough/Fibrino No Wound Bed [name], [name] ([id number]) [id number] Page 5 of 6 Granulation Amount: Large (67-100%) Exposed Structure Granulation Quality: Red Fascia Exposed: No Necrotic Amount: None Present (0%) Fat Layer (Subcutaneous Tissue) Exposed: Yes Tendon Exposed: No Muscle Exposed: No Joint Exposed: No Bone Exposed: No Periwound Skin Texture Texture Color No Abnormalities Noted: No No Abnormalities Noted: No Callus: No Atrophie Blanche: No Crepitus: No Cyanosis: No Excoriation: No Ecchymosis: No Induration: No Erythema: No Rash: No Hemosiderin Staining: No Scarring: No Mottled: No Pallor: No Moisture Rubor: No No Abnormalities Noted: No Dry / Scaly: Yes Maceration: Yes Treatment Notes Wound #1 (Sacrum) Cleanser Wound Cleanser Discharge Instruction: Cleanse the wound with wound cleanser prior to applying a clean dressing using gauze sponges, not tissue or cotton balls. Peri-Wound Care Skin Prep Discharge Instruction: Use skin prep as directed Topical Primary Dressing Maxorb Extra Ag+ Alginate Dressing, 2x2 (in/in) Discharge Instruction: Apply to wound bed as instructed Secondary Dressing Woven Gauze Sponge, Non-Sterile 4x4 in Discharge Instruction: FOLD INTO A FOURTH (SMALL SQUARE) TO ENSURE THE HYDROFERA BLUE IS TOUCHING DOWN INTO THE WOUND BED. Zetuvit Plus Silicone Border Dressing 4x4 (  in/in) Discharge Instruction: Apply silicone border over primary  dressing as directed. Secured With Compression Wrap Compression Stockings Facilities manager) Signed: 02/05/2023 5:54:20 PM By: Shawn Stall RN, BSN Entered By: Shawn Stall on 02/05/2023 09:53:20 -------------------------------------------------------------------------------- Vitals Details Patient Name: Date of Service: KERA, URIARTE NA 02/05/2023 12:45 PM Medical Record Number: [id number] Patient Account Number: [id number] Date of Birth/Sex: Treating RN: Aug 11, 1945 (77 y.o. [name] Primary Care Avyan Livesay: Daphine Deutscher, Mary-Margaret Other Clinician: Referring Ying Blankenhorn: Treating Taurean Ju/Extender: Lauretta Grill, Mary-Margaret Weeks in Treatment: [address] [name] ([id number]) [id number] Page 6 of 6 Vital Signs Time Taken: 12:45 Temperature (F): 97.9 Height (in): 62 Pulse (bpm): 55 Weight (lbs): 139 Respiratory Rate (breaths/min): 20 Body Mass Index (BMI): 25.4 Blood Pressure (mmHg): 160/90 Reference Range: 80 - 120 mg / dl Electronic Signature(s) Signed: 02/05/2023 5:54:20 PM By: Shawn Stall RN, BSN Entered By: Shawn Stall on 02/05/2023 09:47:15

## 2023-02-06 NOTE — Progress Notes (Signed)
[name], [name] ([id number]) [id number] Page 1 of 8 Visit Report for 02/05/2023 Chief Complaint Document Details Patient Name: Date of Service: [name], [name] NA 02/05/2023 12:45 PM Medical Record Number: [id number] Patient Account Number: [id number] Date of Birth/Sex: Treating RN: March 15, 1946 (77 y.o. F) Primary Care Provider: Daphine Deutscher, Mary-Margaret Other Clinician: Referring Provider: Treating Provider/Extender: Lauretta Grill, Mary-Margaret Weeks in Treatment: 16 Information Obtained from: Patient Chief Complaint Sacral pressure ulcer Electronic Signature(s) Signed: 02/05/2023 1:05:51 PM By: Allen Derry PA-C Entered By: Allen Derry on 02/05/2023 10:05:51 -------------------------------------------------------------------------------- Debridement Details Patient Name: Date of Service: [name], [name] NA 02/05/2023 12:45 PM Medical Record Number: [id number] Patient Account Number: [id number] Date of Birth/Sex: Treating RN: 30-Apr-1946 (77 y.o. [name], [name] Primary Care Provider: Daphine Deutscher, Mary-Margaret Other Clinician: Referring Provider: Treating Provider/Extender: Lauretta Grill, Mary-Margaret Weeks in Treatment: 16 Debridement Performed for Assessment: Wound #1 Sacrum Performed By: Physician Lenda Kelp, PA The following information was scribed by: [name] The information was scribed for: Lenda Kelp Debridement Type: Debridement Level of Consciousness (Pre-procedure): Awake and Alert Pre-procedure Verification/Time Out Yes - 12:50 Taken: Start Time: 12:51 Pain Control: Lidocaine 5% topical ointment Percent of Wound Bed Debrided: 100% T Area Debrided (cm): otal 0.24 Tissue and other material debrided: Viable, Non-Viable, Slough, Skin: Dermis , Skin: Epidermis, Slough Level: Skin/Epidermis Debridement Description: Selective/Open Wound Instrument: Curette Bleeding: Minimum Hemostasis Achieved: Pressure End Time:  12:58 Procedural Pain: 0 Post Procedural Pain: 0 Response to Treatment: Procedure was tolerated well Level of Consciousness (Post- Awake and Alert procedure): Post Debridement Measurements of Total Wound Length: (cm) 1 Stage: Category/Stage IV Width: (cm) 0.3 Depth: (cm) 0.2 Volume: (cm) 0.047 Wickizer, [name] ([id number]) [id number] Page 2 of 8 Character of Wound/Ulcer Post Debridement: Improved Post Procedure Diagnosis Same as Pre-procedure Electronic Signature(s) Signed: 02/05/2023 5:54:20 PM By: Cindy Stall RN, BSN Signed: 02/06/2023 5:07:09 PM By: Allen Derry PA-C Entered By: [name] on 02/05/2023 09:59:24 -------------------------------------------------------------------------------- HPI Details Patient Name: Date of Service: [name], [name] NA 02/05/2023 12:45 PM Medical Record Number: [id number] Patient Account Number: [id number] Date of Birth/Sex: Treating RN: 26-Aug-1945 (77 y.o. F) Primary Care Provider: Daphine Deutscher, Mary-Margaret Other Clinician: Referring Provider: Treating Provider/Extender: Lauretta Grill, Mary-Margaret Weeks in Treatment: 16 History of Present Illness HPI Description: 10-16-2022 upon evaluation today patient appears to be doing somewhat poorly in regard to a wound in the sacral area/lower back region. Fortunately there does not appear to be any signs of infection at this point which is great news. With that being said she unfortunately did develop this wound as a result of having had a hospital stay which dates back to looks like at least the beginning of April where there was unstageable wound noted. It was between May 2 and 7 that she fell and broke her shoulder and was put in a sling that she had C. difficile infection and subsequently this is when the wound was mostly described. Fortunately there does not appear to be any signs of infection locally or systemically which is great news but at the same time she does have  a significantly deep wound that is of concern at this point. That is the reason she comes in for evaluation to see if there is anything we can offer or recommend to get this healed faster. Patient does have a history of COPD, hypertension, chronic kidney disease stage III, atrial fibrillation, and vascular dementia although there is no behavioral disturbance that I see or perceived that she seems to be answering questions  appropriately and asking good questions to be honest. 10-30-2022 upon evaluation today patient appears to be doing well currently in regard to her wound which appears to be healthy at this point. I do not see any signs of active infection locally nor systemically which is great news. No fevers, chills, nausea, vomiting, or diarrhea. With that being said I am actually not seeing much improvement compared to last time I saw her simply due to the fact that she has not had the wound VAC on she was seen on the 22nd and then subsequently ended up being admitted to the hospital on the 25th due to a blood clot in her leg. This ended with therapy in the hospital through what appears to be the third so she is going to back to the facility for 2 days before coming in today and then subsequently the wound VAC was not used upon readmission. We did have a letter from the treatment nurse that stated the patient transfers independently multiple times throughout the day. Obviously based on the weakness and what I am seeing it took to individuals my nurse and the individual with her from the facility to move her into the bed today safely. I do not think she needs to be transferring independently and I think she may need a sitter in fact we might even need to do this long-term while she is on the wound VAC in order to ensure that she does not try to get up on her own and in the process hurt herself. Fortunately I do not see any signs of active infection locally nor systemically which is great news. I do  still think a wound VAC would be the primary best way to get this closed as quickly as possible. 11-13-2022 upon evaluation patient appears to be doing better in regard to her wounds from a size perspective although are still having a lot of issues here with the facility putting on the wound VAC appropriately. They continue to not really do this appropriately which has been the primary concern. In fact because of that it is not really functioning properly and continues to not do as well as we really think it should be. Nonetheless we will going to see about having [employer] go out and do some training with regard to placement of wound vacs. 11-20-2022 upon evaluation today patient appears to be doing well currently in regard to her wound. She is actually showing signs of improvement I am actually very pleased with where things stand I do believe that wound VAC is doing a good job here. Fortunately I do not see any evidence of active infection locally or systemically which is great news. No fevers, chills, nausea, vomiting, or diarrhea. 11-27-2022 upon evaluation patient's wound is doing really about the same. I do not think it is any worse also do not think it significantly better at this point. I do think that she would benefit from a continuation of therapy with regard to the wound VAC but unfortunately this just does not seem to be staying in place which is unfortunate. 12-11-2022 upon evaluation patient's wound unfortunately does not appear to be doing quite as well today as I was hoping for. It does appear that she has some evidence of breakdown as far as the dressing not staying quite in place. I think that she needs to have this more appropriately secured with some bolster behind to help hold the dressing in place. The patient voiced understanding. Subsequently I do believe  that we are making good headway towards complete closure which is good news but at the same time we need to make sure that the  dressing is maintaining contact with the base of the wound otherwise when I can see the improvement we want. 12-25-2022 upon evaluation today patient appears to be doing well currently in regard to her wound. She has been tolerating the dressing changes without complication. Fortunately there does not appear to be any signs of active infection at this time. No fevers, chills, nausea, vomiting, or diarrhea. 01-08-2023 upon evaluation today patient appears to be doing well currently in regard to her wound. Fortunately there does not appear to be any signs of active infection at this time which is great news and in general I do believe that we are making headway towards complete closure. With that being said I think that we may want to switch over to a collagen dressing at this point. 8/28; this is a patient with a small wound on the lower sacrum that has been improving but quite a marked rate. We have been using silver collagen. She is from [facility] nursing home. 02-05-2023 upon evaluation today patient appears to be doing well currently in regard to her wound which is very close to being done but not 100%. Fortunately I do not see any evidence of worsening or infection at this time. I do not see any signs of active infection which is great news. No fevers, chills, nausea, vomiting, or diarrhea. DAIRYN, EKLOF ([id number]) [id number] Page 3 of 8 Electronic Signature(s) Signed: 02/05/2023 1:06:09 PM By: Allen Derry PA-C Entered By: Allen Derry on 02/05/2023 10:06:09 -------------------------------------------------------------------------------- Physical Exam Details Patient Name: Date of Service: [name], [name] NA 02/05/2023 12:45 PM Medical Record Number: [id number] Patient Account Number: [id number] Date of Birth/Sex: Treating RN: 12/25/1945 (77 y.o. F) Primary Care Provider: Daphine Deutscher, Mary-Margaret Other Clinician: Referring Provider: Treating Provider/Extender: Lauretta Grill, Mary-Margaret Weeks in Treatment: 16 Constitutional Well-nourished and well-hydrated in no acute distress. Psychiatric this patient is able to make decisions and demonstrates good insight into disease process. Alert and Oriented x 3. pleasant and cooperative. Notes Upon inspection patient's wound bed actually showed signs of need for slight sharp debridement to clearway necrotic debris she tolerated that today without complication postdebridement the wound bed appears to be doing much better which is great news. Electronic Signature(s) Signed: 02/05/2023 1:06:41 PM By: Allen Derry PA-C Entered By: Allen Derry on 02/05/2023 10:06:40 -------------------------------------------------------------------------------- Physician Orders Details Patient Name: Date of Service: [name], [name] NA 02/05/2023 12:45 PM Medical Record Number: [id number] Patient Account Number: [id number] Date of Birth/Sex: Treating RN: August 11, 1945 (77 y.o. Arta Silence Primary Care Provider: Daphine Deutscher, Mary-Margaret Other Clinician: Referring Provider: Treating Provider/Extender: Lauretta Grill, Mary-Margaret Weeks in Treatment: 16 The following information was scribed by: [name] The information was scribed for: Lenda Kelp Verbal / Phone Orders: No Diagnosis Coding ICD-10 Coding Code Description L89.154 Pressure ulcer of sacral region, stage 4 J44.9 Chronic obstructive pulmonary disease, unspecified I10 Essential (primary) hypertension N18.30 Chronic kidney disease, stage 3 unspecified I48.0 Paroxysmal atrial fibrillation F01.A0 Vascular dementia, mild, without behavioral disturbance, psychotic disturbance, mood disturbance, and anxiety Follow-up Appointments ppointment in 2 weeks. Leonard Schwartz Wednesday 02/19/2023 1115 (already scheduled) Return A Return appointment in 1 month. Leonard Schwartz Wednesday ****** ask front desk for an appointment************ Other: - Changing dressing to calcium  alginate Ag wound change x3 weeks. CLOVA, GOYNE ([id number]) [id number] Page 4 of 8 Anesthetic (In clinic) Topical Lidocaine  4% applied to wound bed Off-Loading Low air-loss mattress (Group 2) - facility to ensure patient is using a group 2 air mattress. Turn and reposition every 2 hours Other: - continue using wheelchair cushion when in wheelchair. patient to minimize being up in wheelchair. 2 hours in the chair then 1 hour in the bed. Additional Orders / Instructions Follow Nutritious Diet - increase protein to aid in wound healing. Other: - **********USE AandD ointment for apply to both leg lower legs daily.****** Juven Shake 1-2 times daily. Wound Treatment Wound #1 - Sacrum Cleanser: Wound Cleanser 3 x Per Week/30 Days Discharge Instructions: Cleanse the wound with wound cleanser prior to applying a clean dressing using gauze sponges, not tissue or cotton balls. Peri-Wound Care: Skin Prep 3 x Per Week/30 Days Discharge Instructions: Use skin prep as directed Prim Dressing: Maxorb Extra Ag+ Alginate Dressing, 2x2 (in/in) 3 x Per Week/30 Days ary Discharge Instructions: Apply to wound bed as instructed Secondary Dressing: Woven Gauze Sponge, Non-Sterile 4x4 in 3 x Per Week/30 Days Discharge Instructions: FOLD INTO A FOURTH (SMALL SQUARE) TO ENSURE THE HYDROFERA BLUE IS TOUCHING DOWN INTO THE WOUND BED. Secondary Dressing: Zetuvit Plus Silicone Border Dressing 4x4 (in/in) 3 x Per Week/30 Days Discharge Instructions: Apply silicone border over primary dressing as directed. Electronic Signature(s) Signed: 02/05/2023 5:54:20 PM By: Cindy Stall RN, BSN Signed: 02/06/2023 5:07:09 PM By: Allen Derry PA-C Entered By: [name] on 02/05/2023 10:02:00 -------------------------------------------------------------------------------- Problem List Details Patient Name: Date of Service: [name], [name] NA 02/05/2023 12:45 PM Medical Record Number: [id number] Patient  Account Number: [id number] Date of Birth/Sex: Treating RN: 1946-02-26 (77 y.o. Arta Silence Primary Care Provider: Daphine Deutscher, Mary-Margaret Other Clinician: Referring Provider: Treating Provider/Extender: Lauretta Grill, Mary-Margaret Weeks in Treatment: 16 Active Problems ICD-10 Encounter Code Description Active Date MDM Diagnosis L89.154 Pressure ulcer of sacral region, stage 4 10/16/2022 No Yes J44.9 Chronic obstructive pulmonary disease, unspecified 10/16/2022 No Yes I10 Essential (primary) hypertension 10/16/2022 No Yes N18.30 Chronic kidney disease, stage 3 unspecified 10/16/2022 No Yes Vue, [name] ([id number]) [id number] Page 5 of 8 I48.0 Paroxysmal atrial fibrillation 10/16/2022 No Yes F01.A0 Vascular dementia, mild, without behavioral disturbance, psychotic 10/16/2022 No Yes disturbance, mood disturbance, and anxiety Inactive Problems Resolved Problems Electronic Signature(s) Signed: 02/05/2023 1:05:41 PM By: Allen Derry PA-C Entered By: Allen Derry on 02/05/2023 10:05:41 -------------------------------------------------------------------------------- Progress Note Details Patient Name: Date of Service: [name], [name] NA 02/05/2023 12:45 PM Medical Record Number: [id number] Patient Account Number: [id number] Date of Birth/Sex: Treating RN: [birth year] (77 y.o. F) Primary Care Provider: Daphine Deutscher, Mary-Margaret Other Clinician: Referring Provider: Treating Provider/Extender: Lauretta Grill, Mary-Margaret Weeks in Treatment: 16 Subjective Chief Complaint Information obtained from Patient Sacral pressure ulcer History of Present Illness (HPI) 10-16-2022 upon evaluation today patient appears to be doing somewhat poorly in regard to a wound in the sacral area/lower back region. Fortunately there does not appear to be any signs of infection at this point which is great news. With that being said she unfortunately did develop this wound as a  result of having had a hospital stay which dates back to looks like at least the beginning of April where there was unstageable wound noted. It was between May 2 and 7 that she fell and broke her shoulder and was put in a sling that she had C. difficile infection and subsequently this is when the wound was mostly described. Fortunately there does not appear to be any signs of infection locally or systemically which is great news  but at the same time she does have a significantly deep wound that is of concern at this point. That is the reason she comes in for evaluation to see if there is anything we can offer or recommend to get this healed faster. Patient does have a history of COPD, hypertension, chronic kidney disease stage III, atrial fibrillation, and vascular dementia although there is no behavioral disturbance that I see or perceived that she seems to be answering questions appropriately and asking good questions to be honest. 10-30-2022 upon evaluation today patient appears to be doing well currently in regard to her wound which appears to be healthy at this point. I do not see any signs of active infection locally nor systemically which is great news. No fevers, chills, nausea, vomiting, or diarrhea. With that being said I am actually not seeing much improvement compared to last time I saw her simply due to the fact that she has not had the wound VAC on she was seen on the 22nd and then subsequently ended up being admitted to the hospital on the 25th due to a blood clot in her leg. This ended with therapy in the hospital through what appears to be the third so she is going to back to the facility for 2 days before coming in today and then subsequently the wound VAC was not used upon readmission. We did have a letter from the treatment nurse that stated the patient transfers independently multiple times throughout the day. Obviously based on the weakness and what I am seeing it took to  individuals my nurse and the individual with her from the facility to move her into the bed today safely. I do not think she needs to be transferring independently and I think she may need a sitter in fact we might even need to do this long-term while she is on the wound VAC in order to ensure that she does not try to get up on her own and in the process hurt herself. Fortunately I do not see any signs of active infection locally nor systemically which is great news. I do still think a wound VAC would be the primary best way to get this closed as quickly as possible. 11-13-2022 upon evaluation patient appears to be doing better in regard to her wounds from a size perspective although are still having a lot of issues here with the facility putting on the wound VAC appropriately. They continue to not really do this appropriately which has been the primary concern. In fact because of that it is not really functioning properly and continues to not do as well as we really think it should be. Nonetheless we will going to see about having [employer] go out and do some training with regard to placement of wound vacs. 11-20-2022 upon evaluation today patient appears to be doing well currently in regard to her wound. She is actually showing signs of improvement I am actually very pleased with where things stand I do believe that wound VAC is doing a good job here. Fortunately I do not see any evidence of active infection locally or systemically which is great news. No fevers, chills, nausea, vomiting, or diarrhea. 11-27-2022 upon evaluation patient's wound is doing really about the same. I do not think it is any worse also do not think it significantly better at this point. I do think that she would benefit from a continuation of therapy with regard to the wound VAC but unfortunately this just does not  seem to be staying in place which is unfortunate. 12-11-2022 upon evaluation patient's wound unfortunately does not appear  to be doing quite as well today as I was hoping for. It does appear that she has some evidence of breakdown as far as the dressing not staying quite in place. I think that she needs to have this more appropriately secured with some bolster behind to help hold the dressing in place. The patient voiced understanding. Subsequently I do believe that we are making good headway towards complete closure which is good news but at the same time we need to make sure that the dressing is maintaining contact with the base of the wound otherwise when I can see the improvement we want. [name], [name] ([id number]) [id number] Page 6 of 8 12-25-2022 upon evaluation today patient appears to be doing well currently in regard to her wound. She has been tolerating the dressing changes without complication. Fortunately there does not appear to be any signs of active infection at this time. No fevers, chills, nausea, vomiting, or diarrhea. 01-08-2023 upon evaluation today patient appears to be doing well currently in regard to her wound. Fortunately there does not appear to be any signs of active infection at this time which is great news and in general I do believe that we are making headway towards complete closure. With that being said I think that we may want to switch over to a collagen dressing at this point. 8/28; this is a patient with a small wound on the lower sacrum that has been improving but quite a marked rate. We have been using silver collagen. She is from [facility] nursing home. 02-05-2023 upon evaluation today patient appears to be doing well currently in regard to her wound which is very close to being done but not 100%. Fortunately I do not see any evidence of worsening or infection at this time. I do not see any signs of active infection which is great news. No fevers, chills, nausea, vomiting, or diarrhea. Objective Constitutional Well-nourished and well-hydrated in no  acute distress. Vitals Time Taken: 12:45 PM, Height: 62 in, Weight: 139 lbs, BMI: 25.4, Temperature: 97.9 F, Pulse: 55 bpm, Respiratory Rate: 20 breaths/min, Blood Pressure: 160/90 mmHg. Psychiatric this patient is able to make decisions and demonstrates good insight into disease process. Alert and Oriented x 3. pleasant and cooperative. General Notes: Upon inspection patient's wound bed actually showed signs of need for slight sharp debridement to clearway necrotic debris she tolerated that today without complication postdebridement the wound bed appears to be doing much better which is great news. Integumentary (Hair, Skin) Wound #1 status is Open. Original cause of wound was Pressure Injury. The date acquired was: 08/26/2022. The wound has been in treatment 16 weeks. The wound is located on the Sacrum. The wound measures 1cm length x 0.3cm width x 0.2cm depth; 0.236cm^2 area and 0.047cm^3 volume. There is Fat Layer (Subcutaneous Tissue) exposed. There is no tunneling or undermining noted. There is a medium amount of serosanguineous drainage noted. The wound margin is thickened. There is large (67-100%) red granulation within the wound bed. There is no necrotic tissue within the wound bed. The periwound skin appearance exhibited: Dry/Scaly, Maceration. The periwound skin appearance did not exhibit: Callus, Crepitus, Excoriation, Induration, Rash, Scarring, Atrophie Blanche, Cyanosis, Ecchymosis, Hemosiderin Staining, Mottled, Pallor, Rubor, Erythema. Assessment Active Problems ICD-10 Pressure ulcer of sacral region, stage 4 Chronic obstructive pulmonary disease, unspecified Essential (primary) hypertension Chronic kidney disease, stage 3 unspecified Paroxysmal atrial fibrillation  Vascular dementia, mild, without behavioral disturbance, psychotic disturbance, mood disturbance, and anxiety Procedures Wound #1 Pre-procedure diagnosis of Wound #1 is a Pressure Ulcer located on the Sacrum . There  was a Selective/Open Wound Skin/Epidermis Debridement with a total area of 0.24 sq cm performed by Lenda Kelp, PA. With the following instrument(s): Curette to remove Viable and Non-Viable tissue/material. Material removed includes Slough, Skin: Dermis, and Skin: Epidermis after achieving pain control using Lidocaine 5% topical ointment. A time out was conducted at 12:50, prior to the start of the procedure. A Minimum amount of bleeding was controlled with Pressure. The procedure was tolerated well with a pain level of 0 throughout and a pain level of 0 following the procedure. Post Debridement Measurements: 1cm length x 0.3cm width x 0.2cm depth; 0.047cm^3 volume. Post debridement Stage noted as Category/Stage IV. Character of Wound/Ulcer Post Debridement is improved. Post procedure Diagnosis Wound #1: Same as Pre-Procedure Plan Follow-up Appointments: [name], [name] ([id number]) [phone].pdf Page 7 of 8 Return Appointment in 2 weeks. Leonard Schwartz Wednesday 02/19/2023 1115 (already scheduled) Return appointment in 1 month. Leonard Schwartz Wednesday ****** ask front desk for an appointment************ Other: - Changing dressing to calcium alginate Ag wound change x3 weeks. Anesthetic: (In clinic) Topical Lidocaine 4% applied to wound bed Off-Loading: Low air-loss mattress (Group 2) - facility to ensure patient is using a group 2 air mattress. Turn and reposition every 2 hours Other: - continue using wheelchair cushion when in wheelchair. patient to minimize being up in wheelchair. 2 hours in the chair then 1 hour in the bed. Additional Orders / Instructions: Follow Nutritious Diet - increase protein to aid in wound healing. Other: - **********USE AandD ointment for apply to both leg lower legs daily.****** Juven Shake 1-2 times daily. WOUND #1: - Sacrum Wound Laterality: Cleanser: Wound Cleanser 3 x Per Week/30 Days Discharge Instructions: Cleanse the wound with wound cleanser  prior to applying a clean dressing using gauze sponges, not tissue or cotton balls. Peri-Wound Care: Skin Prep 3 x Per Week/30 Days Discharge Instructions: Use skin prep as directed Prim Dressing: Maxorb Extra Ag+ Alginate Dressing, 2x2 (in/in) 3 x Per Week/30 Days ary Discharge Instructions: Apply to wound bed as instructed Secondary Dressing: Woven Gauze Sponge, Non-Sterile 4x4 in 3 x Per Week/30 Days Discharge Instructions: FOLD INTO A FOURTH (SMALL SQUARE) TO ENSURE THE HYDROFERA BLUE IS TOUCHING DOWN INTO THE WOUND BED. Secondary Dressing: Zetuvit Plus Silicone Border Dressing 4x4 (in/in) 3 x Per Week/30 Days Discharge Instructions: Apply silicone border over primary dressing as directed. #1 I would recommend that we have the patient continue to monitor for any signs of infection or worsening stoma and seeing I do believe that we are making excellent progress towards. Improvement at this point and overall I think that she is getting very close to complete resolution. 2. I am going to recommend that we initiate treatment with an alginate dressing which I think should do well to try to help dry out the wound and hopefully get the last little bit of this completed. We will see patient back for reevaluation in 1 week here in the clinic. If anything worsens or changes patient will contact our office for additional recommendations. Electronic Signature(s) Signed: 02/05/2023 1:06:57 PM By: Allen Derry PA-C Entered By: Allen Derry on 02/05/2023 10:06:57 -------------------------------------------------------------------------------- SuperBill Details Patient Name: Date of Service: [name], BURKETTE NA 02/05/2023 Medical Record Number: [id number] Patient Account Number: [id number] Date of Birth/Sex: Treating RN: August 04, 1945 (77 y.o. Arta Silence Primary Care  Provider: Daphine Deutscher, Mary-Margaret Other Clinician: Referring Provider: Treating Provider/Extender: Lauretta Grill, Mary-Margaret Weeks  in Treatment: 16 Diagnosis Coding ICD-10 Codes Code Description L89.154 Pressure ulcer of sacral region, stage 4 J44.9 Chronic obstructive pulmonary disease, unspecified I10 Essential (primary) hypertension N18.30 Chronic kidney disease, stage 3 unspecified I48.0 Paroxysmal atrial fibrillation F01.A0 Vascular dementia, mild, without behavioral disturbance, psychotic disturbance, mood disturbance, and anxiety Facility Procedures : CPT4 Code: [id number] Description: 97597 - DEBRIDE WOUND 1ST 20 SQ CM OR < ICD-10 Diagnosis Description L89.154 Pressure ulcer of sacral region, stage 4 Modifier: Quantity: 1 Physician Procedures [name], [name] ([id number]): CPT4 Code Description [id number] - WC PHYS DEBR WO ANESTH 20 SQ CM ICD-10 Diagnosis Description L89.154 Pressure ulcer of sacral region, stage 4 [id number] Page 8 of 8: Quantity Modifier 1 Electronic Signature(s) Signed: 02/05/2023 1:07:03 PM By: Allen Derry PA-C Entered By: Allen Derry on 02/05/2023 10:07:03

## 2023-02-11 DIAGNOSIS — E785 Hyperlipidemia, unspecified: Secondary | ICD-10-CM | POA: Diagnosis not present

## 2023-02-11 DIAGNOSIS — J449 Chronic obstructive pulmonary disease, unspecified: Secondary | ICD-10-CM | POA: Diagnosis not present

## 2023-02-11 DIAGNOSIS — F419 Anxiety disorder, unspecified: Secondary | ICD-10-CM | POA: Diagnosis not present

## 2023-02-11 DIAGNOSIS — I5032 Chronic diastolic (congestive) heart failure: Secondary | ICD-10-CM | POA: Diagnosis not present

## 2023-02-11 DIAGNOSIS — I890 Lymphedema, not elsewhere classified: Secondary | ICD-10-CM | POA: Diagnosis not present

## 2023-02-18 DIAGNOSIS — I890 Lymphedema, not elsewhere classified: Secondary | ICD-10-CM | POA: Diagnosis not present

## 2023-02-18 DIAGNOSIS — Z79899 Other long term (current) drug therapy: Secondary | ICD-10-CM | POA: Diagnosis not present

## 2023-02-19 ENCOUNTER — Encounter (HOSPITAL_BASED_OUTPATIENT_CLINIC_OR_DEPARTMENT_OTHER): Payer: Medicare Other | Admitting: Physician Assistant

## 2023-02-19 DIAGNOSIS — I2510 Atherosclerotic heart disease of native coronary artery without angina pectoris: Secondary | ICD-10-CM | POA: Diagnosis not present

## 2023-02-19 DIAGNOSIS — L89154 Pressure ulcer of sacral region, stage 4: Secondary | ICD-10-CM | POA: Diagnosis not present

## 2023-02-19 DIAGNOSIS — N183 Chronic kidney disease, stage 3 unspecified: Secondary | ICD-10-CM | POA: Diagnosis not present

## 2023-02-19 DIAGNOSIS — I480 Paroxysmal atrial fibrillation: Secondary | ICD-10-CM | POA: Diagnosis not present

## 2023-02-19 DIAGNOSIS — I130 Hypertensive heart and chronic kidney disease with heart failure and stage 1 through stage 4 chronic kidney disease, or unspecified chronic kidney disease: Secondary | ICD-10-CM | POA: Diagnosis not present

## 2023-02-19 DIAGNOSIS — I509 Heart failure, unspecified: Secondary | ICD-10-CM | POA: Diagnosis not present

## 2023-02-19 NOTE — Progress Notes (Signed)
or infection at this time. I do not see any signs of active infection which is great news. No fevers, chills, nausea, vomiting, or diarrhea. 02-19-2023 upon evaluation patient actually appears to be doing excellent at this time. Fortunately I do not see any evidence of active infection at this point. And in general the patient seems to be making good headway towards complete closure. Electronic Signature(s) Signed: 02/19/2023 1:41:16 PM By: Cindy Derry PA-C Entered By: [name] on 02/19/2023 13:41:16 -------------------------------------------------------------------------------- Physical Exam Details Patient Name: Date of  Service: [name], [name] NA 02/19/2023 11:15 A M Medical Record Number: [id number] Patient Account Number: [id number] Date of Birth/Sex: Treating RN: 1946/02/07 (77 y.o. F) Primary Care Provider: Daphine Saunders, Cindy Other Clinician: Referring Provider: Treating Provider/Extender: [name], [name] in Treatment: 18 Constitutional Well-nourished and well-hydrated in no acute distress. Respiratory normal breathing without difficulty. Psychiatric this patient is able to make decisions and demonstrates good insight into disease process. Alert and Oriented x 3. pleasant and cooperative. Notes Upon inspection patient's wound bed actually showed signs of actually being completely healed which is great news. Fortunately I think that she is doing excellent and I am going to go ahead and recommend a protective dressing for 2 Saunders and then after that point she can discontinue the use of any dressings going forward. The patient was extremely happy to hear this. Electronic Signature(s) Signed: 02/19/2023 1:41:40 PM By: Cindy Derry PA-C Entered By: [name] on 02/19/2023 13:41:39 -------------------------------------------------------------------------------- Physician Orders Details Patient Name: Date of Service: [name], [name] NA 02/19/2023 11:15 A M Medical Record Number: [id number] Patient Account Number: [id number] Date of Birth/Sex: Treating RN: April 30, 1946 (77 y.o. [name], [name] ([id number]) [id number] Page 3 of 6 Primary Care Provider: Daphine Saunders, Cindy Other Clinician: Referring Provider: Treating Provider/Extender: [name], [name] in Treatment: 18 The following information was scribed by: [name] The information was scribed for: [name] Verbal / Phone Orders: No Diagnosis Coding ICD-10 Coding Code Description L89.154 Pressure ulcer of sacral region, stage 4 J44.9 Chronic  obstructive pulmonary disease, unspecified I10 Essential (primary) hypertension N18.30 Chronic kidney disease, stage 3 unspecified I48.0 Paroxysmal atrial fibrillation F01.A0 Vascular dementia, mild, without behavioral disturbance, psychotic disturbance, mood disturbance, and anxiety Discharge From [facility] Discharge from Wound Care Center - Call if any future wound care needs. Protect area with bordered foam x2 Saunders- change every other day. Electronic Signature(s) Signed: 02/19/2023 3:53:17 PM By: Cindy Derry PA-C Signed: 02/19/2023 7:24:00 PM By: Cindy Stall RN, BSN Entered By: [name] on 02/19/2023 11:43:45 -------------------------------------------------------------------------------- Problem List Details Patient Name: Date of Service: [name], [name] NA 02/19/2023 11:15 A M Medical Record Number: [id number] Patient Account Number: [id number] Date of Birth/Sex: Treating RN: Aug 12, 1945 (77 y.o. F) Primary Care Provider: Daphine Saunders, Cindy Other Clinician: Referring Provider: Treating Provider/Extender: [name], [name] in Treatment: 18 Active Problems ICD-10 Encounter Code Description Active Date MDM Diagnosis L89.154 Pressure ulcer of sacral region, stage 4 10/16/2022 No Yes J44.9 Chronic obstructive pulmonary disease, unspecified 10/16/2022 No Yes I10 Essential (primary) hypertension 10/16/2022 No Yes N18.30 Chronic kidney disease, stage 3 unspecified 10/16/2022 No Yes I48.0 Paroxysmal atrial fibrillation 10/16/2022 No Yes F01.A0 Vascular dementia, mild, without behavioral disturbance, psychotic 10/16/2022 No Yes disturbance, mood disturbance, and anxiety [place], [name] ([id number]) [id number] Page 4 of 6 Inactive Problems Resolved Problems Electronic Signature(s) Signed: 02/19/2023 11:38:44 AM By: Cindy Derry PA-C Entered By: [name] on 02/19/2023  11:38:44 -------------------------------------------------------------------------------- Progress Note Details Patient Name: Date  97.9 F, Pulse: 49 bpm, Respiratory Rate: 18 breaths/min, Blood Pressure: 154/80 mmHg. Respiratory normal breathing without difficulty. Psychiatric this patient is able to make decisions and demonstrates good insight into disease process. Alert and Oriented x 3. pleasant and cooperative. General Notes: Upon inspection patient's wound bed actually showed signs of actually being completely healed which is great news. Fortunately I think that she is doing excellent and I am going to go ahead and recommend a protective dressing for 2 Saunders and then after that point she can discontinue the use of any dressings going forward. The patient was extremely happy to hear  this. Integumentary (Hair, Skin) Wound #1 status is Healed - Epithelialized. Original cause of wound was Pressure Injury. The date acquired was: 08/26/2022. The wound has been in treatment 18 Saunders. The wound is located on the Sacrum. The wound measures 0cm length x 0cm width x 0cm depth; 0cm^2 area and 0cm^3 volume. There is Fat Layer (Subcutaneous Tissue) exposed. There is no tunneling or undermining noted. There is a medium amount of serosanguineous drainage noted. The wound margin is thickened. There is no granulation within the wound bed. There is a large (67-100%) amount of necrotic tissue within the wound bed. The periwound skin appearance exhibited: Dry/Scaly, Maceration. The periwound skin appearance did not exhibit: Callus, Crepitus, Excoriation, Induration, Rash, Scarring, Atrophie Blanche, Cyanosis, Ecchymosis, Hemosiderin Staining, Mottled, Pallor, Rubor, Erythema. Periwound temperature was noted as No Abnormality. Assessment Active Problems ICD-10 Pressure ulcer of sacral region, stage 4 Chronic obstructive pulmonary disease, unspecified Essential (primary) hypertension Chronic kidney disease, stage 3 unspecified Paroxysmal atrial fibrillation Vascular dementia, mild, without behavioral disturbance, psychotic disturbance, mood disturbance, and anxiety Plan Discharge From [facility]: Discharge from Wound Care Center - Call if any future wound care needs. Protect area with bordered foam x2 Saunders- change every other day. #1 would recommend that we have the patient continue to monitor for any evidence of worsening infection obviously if anything changes at the office let me know. 2. I am going to recommend as well that the patient should continue with appropriate offloading I think this is still to be of utmost importance she voiced understanding. We will see her back for a follow-up visit as needed. Electronic Signature(s) Signed: 02/19/2023 1:42:06 PM By: Cindy Derry  PA-C Entered By: [name] on 02/19/2023 13:42:06 Coles, [name] ([id number]) [id number] Page 6 of 6 -------------------------------------------------------------------------------- SuperBill Details Patient Name: Date of Service: [name], [name] NA 02/19/2023 Medical Record Number: [id number] Patient Account Number: [id number] Date of Birth/Sex: Treating RN: 08/15/1945 (77 y.o. Cindy Silence Primary Care Provider: Daphine Saunders, Cindy Other Clinician: Referring Provider: Treating Provider/Extender: [name], [name] in Treatment: 18 Diagnosis Coding ICD-10 Codes Code Description L89.154 Pressure ulcer of sacral region, stage 4 J44.9 Chronic obstructive pulmonary disease, unspecified I10 Essential (primary) hypertension N18.30 Chronic kidney disease, stage 3 unspecified I48.0 Paroxysmal atrial fibrillation F01.A0 Vascular dementia, mild, without behavioral disturbance, psychotic disturbance, mood disturbance, and anxiety Facility Procedures : CPT4 Code: [id number] Description: 99213 - WOUND CARE VISIT-LEV 3 EST PT Modifier: Quantity: 1 Physician Procedures : CPT4 Code Description Modifier [id number] 99213 - WC PHYS LEVEL 3 - EST PT ICD-10 Diagnosis Description L89.154 Pressure ulcer of sacral region, stage 4 J44.9 Chronic obstructive pulmonary disease, unspecified I10 Essential (primary) hypertension N18.30  Chronic kidney disease, stage 3 unspecified Quantity: 1 Electronic Signature(s) Signed: 02/19/2023 1:42:20 PM By: Cindy Derry PA-C Entered By: [name] on 02/19/2023 13:42:20  of Service: [name], [name] NA 02/19/2023 11:15 A M Medical Record Number: [id number] Patient Account Number: [id number] Date of Birth/Sex: Treating RN: 31-Dec-1945 (77 y.o. F) Primary Care Provider: Daphine Saunders, Cindy Other Clinician: Referring Provider: Treating Provider/Extender: [name], [name] in Treatment: 18 Subjective Chief Complaint Information obtained from Patient Sacral pressure ulcer History of Present Illness (HPI) 10-16-2022 upon evaluation today patient appears to be doing somewhat poorly in regard to a wound in the sacral area/lower back region. Fortunately there does not appear to be any signs of infection at this point which is great news. With that being said she unfortunately did develop this wound as a result of having had a hospital stay which dates back to looks like at least the beginning of April where there was unstageable wound noted. It was between May 2 and 7 that she fell and broke her shoulder and was put in a sling that she had C. difficile infection and subsequently this is when the wound was mostly described. Fortunately there does not appear to be any signs of infection locally or systemically which is great news but at the same time she does have a significantly deep wound that is of concern at this point. That is the reason she comes in for evaluation to see if there is anything we can offer or recommend to get this healed faster. Patient does have a history of COPD, hypertension, chronic kidney disease stage III, atrial fibrillation, and vascular dementia although there is no behavioral disturbance that I see or perceived that she seems to be answering questions appropriately and asking good questions to be honest. 10-30-2022 upon evaluation today patient appears to be doing well currently in regard to  her wound which appears to be healthy at this point. I do not see any signs of active infection locally nor systemically which is great news. No fevers, chills, nausea, vomiting, or diarrhea. With that being said I am actually not seeing much improvement compared to last time I saw her simply due to the fact that she has not had the wound VAC on she was seen on the 22nd and then subsequently ended up being admitted to the hospital on the 25th due to a blood clot in her leg. This ended with therapy in the hospital through what appears to be the third so she is going to back to the facility for 2 days before coming in today and then subsequently the wound VAC was not used upon readmission. We did have a letter from the treatment nurse that stated the patient transfers independently multiple times throughout the day. Obviously based on the weakness and what I am seeing it took to individuals my nurse and the individual with her from the facility to move her into the bed today safely. I do not think she needs to be transferring independently and I think she may need a sitter in fact we might even need to do this long-term while she is on the wound VAC in order to ensure that she does not try to get up on her own and in the process hurt herself. Fortunately I do not see any signs of active infection locally nor systemically which is great news. I do still think a wound VAC would be the primary best way to get this closed as quickly as possible. 11-13-2022 upon evaluation patient appears to be doing better in regard to her wounds from a size perspective although are still having a lot of issues  or infection at this time. I do not see any signs of active infection which is great news. No fevers, chills, nausea, vomiting, or diarrhea. 02-19-2023 upon evaluation patient actually appears to be doing excellent at this time. Fortunately I do not see any evidence of active infection at this point. And in general the patient seems to be making good headway towards complete closure. Electronic Signature(s) Signed: 02/19/2023 1:41:16 PM By: Cindy Derry PA-C Entered By: [name] on 02/19/2023 13:41:16 -------------------------------------------------------------------------------- Physical Exam Details Patient Name: Date of  Service: [name], [name] NA 02/19/2023 11:15 A M Medical Record Number: [id number] Patient Account Number: [id number] Date of Birth/Sex: Treating RN: 1946/02/07 (77 y.o. F) Primary Care Provider: Daphine Saunders, Cindy Other Clinician: Referring Provider: Treating Provider/Extender: [name], [name] in Treatment: 18 Constitutional Well-nourished and well-hydrated in no acute distress. Respiratory normal breathing without difficulty. Psychiatric this patient is able to make decisions and demonstrates good insight into disease process. Alert and Oriented x 3. pleasant and cooperative. Notes Upon inspection patient's wound bed actually showed signs of actually being completely healed which is great news. Fortunately I think that she is doing excellent and I am going to go ahead and recommend a protective dressing for 2 Saunders and then after that point she can discontinue the use of any dressings going forward. The patient was extremely happy to hear this. Electronic Signature(s) Signed: 02/19/2023 1:41:40 PM By: Cindy Derry PA-C Entered By: [name] on 02/19/2023 13:41:39 -------------------------------------------------------------------------------- Physician Orders Details Patient Name: Date of Service: [name], [name] NA 02/19/2023 11:15 A M Medical Record Number: [id number] Patient Account Number: [id number] Date of Birth/Sex: Treating RN: April 30, 1946 (77 y.o. [name], [name] ([id number]) [id number] Page 3 of 6 Primary Care Provider: Daphine Saunders, Cindy Other Clinician: Referring Provider: Treating Provider/Extender: [name], [name] in Treatment: 18 The following information was scribed by: [name] The information was scribed for: [name] Verbal / Phone Orders: No Diagnosis Coding ICD-10 Coding Code Description L89.154 Pressure ulcer of sacral region, stage 4 J44.9 Chronic  obstructive pulmonary disease, unspecified I10 Essential (primary) hypertension N18.30 Chronic kidney disease, stage 3 unspecified I48.0 Paroxysmal atrial fibrillation F01.A0 Vascular dementia, mild, without behavioral disturbance, psychotic disturbance, mood disturbance, and anxiety Discharge From [facility] Discharge from Wound Care Center - Call if any future wound care needs. Protect area with bordered foam x2 Saunders- change every other day. Electronic Signature(s) Signed: 02/19/2023 3:53:17 PM By: Cindy Derry PA-C Signed: 02/19/2023 7:24:00 PM By: Cindy Stall RN, BSN Entered By: [name] on 02/19/2023 11:43:45 -------------------------------------------------------------------------------- Problem List Details Patient Name: Date of Service: [name], [name] NA 02/19/2023 11:15 A M Medical Record Number: [id number] Patient Account Number: [id number] Date of Birth/Sex: Treating RN: Aug 12, 1945 (77 y.o. F) Primary Care Provider: Daphine Saunders, Cindy Other Clinician: Referring Provider: Treating Provider/Extender: [name], [name] in Treatment: 18 Active Problems ICD-10 Encounter Code Description Active Date MDM Diagnosis L89.154 Pressure ulcer of sacral region, stage 4 10/16/2022 No Yes J44.9 Chronic obstructive pulmonary disease, unspecified 10/16/2022 No Yes I10 Essential (primary) hypertension 10/16/2022 No Yes N18.30 Chronic kidney disease, stage 3 unspecified 10/16/2022 No Yes I48.0 Paroxysmal atrial fibrillation 10/16/2022 No Yes F01.A0 Vascular dementia, mild, without behavioral disturbance, psychotic 10/16/2022 No Yes disturbance, mood disturbance, and anxiety [place], [name] ([id number]) [id number] Page 4 of 6 Inactive Problems Resolved Problems Electronic Signature(s) Signed: 02/19/2023 11:38:44 AM By: Cindy Derry PA-C Entered By: [name] on 02/19/2023  11:38:44 -------------------------------------------------------------------------------- Progress Note Details Patient Name: Date  of Service: [name], [name] NA 02/19/2023 11:15 A M Medical Record Number: [id number] Patient Account Number: [id number] Date of Birth/Sex: Treating RN: 31-Dec-1945 (77 y.o. F) Primary Care Provider: Daphine Saunders, Cindy Other Clinician: Referring Provider: Treating Provider/Extender: [name], [name] in Treatment: 18 Subjective Chief Complaint Information obtained from Patient Sacral pressure ulcer History of Present Illness (HPI) 10-16-2022 upon evaluation today patient appears to be doing somewhat poorly in regard to a wound in the sacral area/lower back region. Fortunately there does not appear to be any signs of infection at this point which is great news. With that being said she unfortunately did develop this wound as a result of having had a hospital stay which dates back to looks like at least the beginning of April where there was unstageable wound noted. It was between May 2 and 7 that she fell and broke her shoulder and was put in a sling that she had C. difficile infection and subsequently this is when the wound was mostly described. Fortunately there does not appear to be any signs of infection locally or systemically which is great news but at the same time she does have a significantly deep wound that is of concern at this point. That is the reason she comes in for evaluation to see if there is anything we can offer or recommend to get this healed faster. Patient does have a history of COPD, hypertension, chronic kidney disease stage III, atrial fibrillation, and vascular dementia although there is no behavioral disturbance that I see or perceived that she seems to be answering questions appropriately and asking good questions to be honest. 10-30-2022 upon evaluation today patient appears to be doing well currently in regard to  her wound which appears to be healthy at this point. I do not see any signs of active infection locally nor systemically which is great news. No fevers, chills, nausea, vomiting, or diarrhea. With that being said I am actually not seeing much improvement compared to last time I saw her simply due to the fact that she has not had the wound VAC on she was seen on the 22nd and then subsequently ended up being admitted to the hospital on the 25th due to a blood clot in her leg. This ended with therapy in the hospital through what appears to be the third so she is going to back to the facility for 2 days before coming in today and then subsequently the wound VAC was not used upon readmission. We did have a letter from the treatment nurse that stated the patient transfers independently multiple times throughout the day. Obviously based on the weakness and what I am seeing it took to individuals my nurse and the individual with her from the facility to move her into the bed today safely. I do not think she needs to be transferring independently and I think she may need a sitter in fact we might even need to do this long-term while she is on the wound VAC in order to ensure that she does not try to get up on her own and in the process hurt herself. Fortunately I do not see any signs of active infection locally nor systemically which is great news. I do still think a wound VAC would be the primary best way to get this closed as quickly as possible. 11-13-2022 upon evaluation patient appears to be doing better in regard to her wounds from a size perspective although are still having a lot of issues  or infection at this time. I do not see any signs of active infection which is great news. No fevers, chills, nausea, vomiting, or diarrhea. 02-19-2023 upon evaluation patient actually appears to be doing excellent at this time. Fortunately I do not see any evidence of active infection at this point. And in general the patient seems to be making good headway towards complete closure. Electronic Signature(s) Signed: 02/19/2023 1:41:16 PM By: Cindy Derry PA-C Entered By: [name] on 02/19/2023 13:41:16 -------------------------------------------------------------------------------- Physical Exam Details Patient Name: Date of  Service: [name], [name] NA 02/19/2023 11:15 A M Medical Record Number: [id number] Patient Account Number: [id number] Date of Birth/Sex: Treating RN: 1946/02/07 (77 y.o. F) Primary Care Provider: Daphine Saunders, Cindy Other Clinician: Referring Provider: Treating Provider/Extender: [name], [name] in Treatment: 18 Constitutional Well-nourished and well-hydrated in no acute distress. Respiratory normal breathing without difficulty. Psychiatric this patient is able to make decisions and demonstrates good insight into disease process. Alert and Oriented x 3. pleasant and cooperative. Notes Upon inspection patient's wound bed actually showed signs of actually being completely healed which is great news. Fortunately I think that she is doing excellent and I am going to go ahead and recommend a protective dressing for 2 Saunders and then after that point she can discontinue the use of any dressings going forward. The patient was extremely happy to hear this. Electronic Signature(s) Signed: 02/19/2023 1:41:40 PM By: Cindy Derry PA-C Entered By: [name] on 02/19/2023 13:41:39 -------------------------------------------------------------------------------- Physician Orders Details Patient Name: Date of Service: [name], [name] NA 02/19/2023 11:15 A M Medical Record Number: [id number] Patient Account Number: [id number] Date of Birth/Sex: Treating RN: April 30, 1946 (77 y.o. [name], [name] ([id number]) [id number] Page 3 of 6 Primary Care Provider: Daphine Saunders, Cindy Other Clinician: Referring Provider: Treating Provider/Extender: [name], [name] in Treatment: 18 The following information was scribed by: [name] The information was scribed for: [name] Verbal / Phone Orders: No Diagnosis Coding ICD-10 Coding Code Description L89.154 Pressure ulcer of sacral region, stage 4 J44.9 Chronic  obstructive pulmonary disease, unspecified I10 Essential (primary) hypertension N18.30 Chronic kidney disease, stage 3 unspecified I48.0 Paroxysmal atrial fibrillation F01.A0 Vascular dementia, mild, without behavioral disturbance, psychotic disturbance, mood disturbance, and anxiety Discharge From [facility] Discharge from Wound Care Center - Call if any future wound care needs. Protect area with bordered foam x2 Saunders- change every other day. Electronic Signature(s) Signed: 02/19/2023 3:53:17 PM By: Cindy Derry PA-C Signed: 02/19/2023 7:24:00 PM By: Cindy Stall RN, BSN Entered By: [name] on 02/19/2023 11:43:45 -------------------------------------------------------------------------------- Problem List Details Patient Name: Date of Service: [name], [name] NA 02/19/2023 11:15 A M Medical Record Number: [id number] Patient Account Number: [id number] Date of Birth/Sex: Treating RN: Aug 12, 1945 (77 y.o. F) Primary Care Provider: Daphine Saunders, Cindy Other Clinician: Referring Provider: Treating Provider/Extender: [name], [name] in Treatment: 18 Active Problems ICD-10 Encounter Code Description Active Date MDM Diagnosis L89.154 Pressure ulcer of sacral region, stage 4 10/16/2022 No Yes J44.9 Chronic obstructive pulmonary disease, unspecified 10/16/2022 No Yes I10 Essential (primary) hypertension 10/16/2022 No Yes N18.30 Chronic kidney disease, stage 3 unspecified 10/16/2022 No Yes I48.0 Paroxysmal atrial fibrillation 10/16/2022 No Yes F01.A0 Vascular dementia, mild, without behavioral disturbance, psychotic 10/16/2022 No Yes disturbance, mood disturbance, and anxiety [place], [name] ([id number]) [id number] Page 4 of 6 Inactive Problems Resolved Problems Electronic Signature(s) Signed: 02/19/2023 11:38:44 AM By: Cindy Derry PA-C Entered By: [name] on 02/19/2023  11:38:44 -------------------------------------------------------------------------------- Progress Note Details Patient Name: Date

## 2023-02-20 NOTE — Progress Notes (Signed)
[name], [name] ([id number]) [id number] Page 1 of 6 Visit Report for 02/19/2023 Arrival Information Details Patient Name: Date of Service: [name], [name] NA 02/19/2023 11:15 A M Medical Record Number: [id number] Patient Account Number: [id number] Date of Birth/Sex: Treating RN: 1946-03-12 (77 y.o. F) Primary Care Irelyn Perfecto: Daphine Deutscher, Mary-Margaret Other Clinician: Referring Coila Wardell: Treating Cayci Mcnabb/Extender: Lauretta Grill, Mary-Margaret Weeks in Treatment: 18 Visit Information History Since Last Visit Added or deleted any medications: No Patient Arrived: Wheel Chair Any new allergies or adverse reactions: No Arrival Time: 11:09 Had a fall or experienced change in No Accompanied By: caregiver activities of daily living that may affect Transfer Assistance: None risk of falls: Patient Identification Verified: Yes Signs or symptoms of abuse/neglect since last visito No Secondary Verification Process Completed: Yes Hospitalized since last visit: No Patient Requires Transmission-Based Precautions: No Implantable device outside of the clinic excluding No Patient Has Alerts: No cellular tissue based products placed in the center since last visit: Has Dressing in Place as Prescribed: Yes Pain Present Now: No Electronic Signature(s) Signed: 02/19/2023 4:28:55 PM By: Thayer Dallas Entered By: Thayer Dallas on 02/19/2023 11:15:46 -------------------------------------------------------------------------------- Clinic Level of Care Assessment Details Patient Name: Date of Service: [name] NA 02/19/2023 11:15 A M Medical Record Number: [id number] Patient Account Number: [id number] Date of Birth/Sex: Treating RN: [birth year] (77 y.o. Arta Silence Primary Care Maylee Bare: Daphine Deutscher, Mary-Margaret Other Clinician: Referring Maritza Hosterman: Treating Daysia Vandenboom/Extender: Lauretta Grill, Mary-Margaret Weeks in Treatment: 18 Clinic Level of Care Assessment  Items TOOL 4 Quantity Score X- 1 0 Use when only an EandM is performed on FOLLOW-UP visit ASSESSMENTS - Nursing Assessment / Reassessment X- 1 10 Reassessment of Co-morbidities (includes updates in patient status) X- 1 5 Reassessment of Adherence to Treatment Plan ASSESSMENTS - Wound and Skin A ssessment / Reassessment X - Simple Wound Assessment / Reassessment - one wound 1 5 []  - 0 Complex Wound Assessment / Reassessment - multiple wounds X- 1 10 Dermatologic / Skin Assessment (not related to wound area) ASSESSMENTS - Focused Assessment []  - 0 Circumferential Edema Measurements - multi extremities []  - 0 Nutritional Assessment / Counseling / Intervention [place], [name] ([id number]) [phone] Page 2 of 6 []  - 0 Lower Extremity Assessment (monofilament, tuning fork, pulses) []  - 0 Peripheral Arterial Disease Assessment (using hand held doppler) ASSESSMENTS - Ostomy and/or Continence Assessment and Care []  - 0 Incontinence Assessment and Management []  - 0 Ostomy Care Assessment and Management (repouching, etc.) PROCESS - Coordination of Care X - Simple Patient / Family Education for ongoing care 1 15 []  - 0 Complex (extensive) Patient / Family Education for ongoing care X- 1 10 Staff obtains Chiropractor, Records, T Results / Process Orders est []  - 0 Staff telephones HHA, Nursing Homes / Clarify orders / etc []  - 0 Routine Transfer to another Facility (non-emergent condition) []  - 0 Routine Hospital Admission (non-emergent condition) []  - 0 New Admissions / Manufacturing engineer / Ordering NPWT Apligraf, etc. , []  - 0 Emergency Hospital Admission (emergent condition) X- 1 10 Simple Discharge Coordination []  - 0 Complex (extensive) Discharge Coordination PROCESS - Special Needs []  - 0 Pediatric / Minor Patient Management []  - 0 Isolation Patient Management []  - 0 Hearing / Language / Visual special needs []  - 0 Assessment of  Community assistance (transportation, D/C planning, etc.) []  - 0 Additional assistance / Altered mentation []  - 0 Support Surface(s) Assessment (bed, cushion, seat, etc.) INTERVENTIONS - Wound Cleansing / Measurement X - Simple Wound Cleansing - one wound  1 5 []  - 0 Complex Wound Cleansing - multiple wounds X- 1 5 Wound Imaging (photographs - any number of wounds) []  - 0 Wound Tracing (instead of photographs) []  - 0 Simple Wound Measurement - one wound []  - 0 Complex Wound Measurement - multiple wounds INTERVENTIONS - Wound Dressings X - Small Wound Dressing one or multiple wounds 1 10 []  - 0 Medium Wound Dressing one or multiple wounds []  - 0 Large Wound Dressing one or multiple wounds []  - 0 Application of Medications - topical []  - 0 Application of Medications - injection INTERVENTIONS - Miscellaneous []  - 0 External ear exam []  - 0 Specimen Collection (cultures, biopsies, blood, body fluids, etc.) []  - 0 Specimen(s) / Culture(s) sent or taken to Lab for analysis []  - 0 Patient Transfer (multiple staff / Nurse, adult / Similar devices) []  - 0 Simple Staple / Suture removal (25 or less) []  - 0 Complex Staple / Suture removal (26 or more) []  - 0 Hypo / Hyperglycemic Management (close monitor of Blood Glucose) Rausch, [name] ([id number]) [id number] Page 3 of 6 []  - 0 Ankle / Brachial Index (ABI) - do not check if billed separately X- 1 5 Vital Signs Has the patient been seen at the hospital within the last three years: Yes Total Score: 90 Level Of Care: New/Established - Level 3 Electronic Signature(s) Signed: 02/19/2023 7:24:00 PM By: Shawn Stall RN, BSN Entered By: Shawn Stall on 02/19/2023 11:44:13 -------------------------------------------------------------------------------- Encounter Discharge Information Details Patient Name: Date of Service: [name], [name] NA 02/19/2023 11:15 A M Medical Record Number: [id number] Patient Account  Number: [id number] Date of Birth/Sex: Treating RN: Nov 03, 1945 (77 y.o. Arta Silence Primary Care Cheetara Hoge: Daphine Deutscher, Mary-Margaret Other Clinician: Referring Fleda Pagel: Treating Maegan Buller/Extender: Lauretta Grill, Mary-Margaret Weeks in Treatment: 18 Encounter Discharge Information Items Discharge Condition: Stable Ambulatory Status: Wheelchair Discharge Destination: Skilled Nursing Facility Telephoned: No Orders Sent: Yes Transportation: Private Auto Accompanied By: caregiver Schedule Follow-up Appointment: Yes Clinical Summary of Care: Electronic Signature(s) Signed: 02/19/2023 7:24:00 PM By: Shawn Stall RN, BSN Entered By: Shawn Stall on 02/19/2023 11:48:43 -------------------------------------------------------------------------------- Lower Extremity Assessment Details Patient Name: Date of Service: [name], [name] NA 02/19/2023 11:15 A M Medical Record Number: [id number] Patient Account Number: [id number] Date of Birth/Sex: Treating RN: May 22, 1946 (77 y.o. F) Primary Care [place] Sarinana: Daphine Deutscher, Mary-Margaret Other Clinician: Referring Monquie Fulgham: Treating Hance Caspers/Extender: Lauretta Grill, Mary-Margaret Weeks in Treatment: 18 Electronic Signature(s) Signed: 02/19/2023 4:28:55 PM By: Thayer Dallas Entered By: Thayer Dallas on 02/19/2023 11:16:22 Multi-Disciplinary Care Plan Details -------------------------------------------------------------------------------- Veronia Beets ([id number]) [id number] Page 4 of 6 Patient Name: Date of Service: [name], [name] NA 02/19/2023 11:15 A M Medical Record Number: [id number] Patient Account Number: [id number] Date of Birth/Sex: Treating RN: 15-Jun-1945 (77 y.o. Arta Silence Primary Care Joseph Johns: Daphine Deutscher, Mary-Margaret Other Clinician: Referring Colisha Redler: Treating Yobany Vroom/Extender: Lauretta Grill, Mary-Margaret Weeks in Treatment: 18 Multidisciplinary Care Plan reviewed with physician Active  Inactive Electronic Signature(s) Signed: 02/19/2023 7:24:00 PM By: Shawn Stall RN, BSN Entered By: Shawn Stall on 02/19/2023 11:42:10 -------------------------------------------------------------------------------- Pain Assessment Details Patient Name: Date of Service: [name], [name] NA 02/19/2023 11:15 A M Medical Record Number: [id number] Patient Account Number: [id number] Date of Birth/Sex: Treating RN: 1946-01-13 (77 y.o. F) Primary Care Meena Barrantes: Daphine Deutscher, Mary-Margaret Other Clinician: Referring Dayn Barich: Treating Ferris Fielden/Extender: Lauretta Grill, Mary-Margaret Weeks in Treatment: 18 Active Problems Location of Pain Severity and Description of Pain Patient Has Paino No Site Locations Pain Management and Medication Current Pain Management: Electronic Signature(s) Signed: 02/19/2023 4:28:55  PM By: Thayer Dallas Entered By: Thayer Dallas on 02/19/2023 11:16:12 Patient/Caregiver Education Details -------------------------------------------------------------------------------- Veronia Beets ([id number]) [id number] Page 5 of 6 Patient Name: Date of Service: [name], [name] NA 9/25/2024andnbsp11:15 A M Medical Record Number: [id number] Patient Account Number: [id number] Date of Birth/Gender: Treating RN: 01-03-1946 (77 y.o. Arta Silence Primary Care Physician: Daphine Deutscher, Mary-Margaret Other Clinician: Referring Physician: Treating Physician/Extender: Lauretta Grill, Mary-Margaret Weeks in Treatment: 18 Education Assessment Education Provided To: Patient Education Topics Provided Pressure: Handouts: Pressure Injury: Prevention and Offloading Methods: Explain/Verbal Responses: Reinforcements needed Electronic Signature(s) Signed: 02/19/2023 7:24:00 PM By: Shawn Stall RN, BSN Entered By: Shawn Stall on 02/19/2023 11:41:47 -------------------------------------------------------------------------------- Wound Assessment Details Patient  Name: Date of Service: [name], [name] NA 02/19/2023 11:15 A M Medical Record Number: [id number] Patient Account Number: [id number] Date of Birth/Sex: Treating RN: 1946/04/17 (77 y.o. [name], [name] Primary Care Lillyian Heidt: Daphine Deutscher, Mary-Margaret Other Clinician: Referring Chief Walkup: Treating Sherryll Skoczylas/Extender: Lauretta Grill, Mary-Margaret Weeks in Treatment: 18 Wound Status Wound Number: 1 Primary Pressure Ulcer Etiology: Wound Location: Sacrum Wound Healed - Epithelialized Wounding Event: Pressure Injury Status: Date Acquired: 08/26/2022 Comorbid Chronic Obstructive Pulmonary Disease (COPD), Congestive Weeks Of Treatment: 18 History: Heart Failure, Coronary Artery Disease, Deep Vein Thrombosis, Clustered Wound: No Hypertension, Peripheral Venous Disease, Osteoarthritis, Dementia Photos Wound Measurements Length: (cm) Width: (cm) Depth: (cm) Area: (cm) Volume: (cm) 0 % Reduction in Area: 100% 0 % Reduction in Volume: 100% 0 Epithelialization: None 0 Tunneling: No 0 Undermining: No Wound Description Classification: Category/Stage IV Zalewski, [name] ([id number]) Wound Margin: Thickened Exudate Amount: Medium Exudate Type: Serosanguineous Exudate Color: red, brown Foul Odor After Cleansing: No [id number] Page 6 of 6 Slough/Fibrino No Wound Bed Granulation Amount: None Present (0%) Exposed Structure Necrotic Amount: Large (67-100%) Fascia Exposed: No Fat Layer (Subcutaneous Tissue) Exposed: Yes Tendon Exposed: No Muscle Exposed: No Joint Exposed: No Bone Exposed: No Periwound Skin Texture Texture Color No Abnormalities Noted: No No Abnormalities Noted: No Callus: No Atrophie Blanche: No Crepitus: No Cyanosis: No Excoriation: No Ecchymosis: No Induration: No Erythema: No Rash: No Hemosiderin Staining: No Scarring: No Mottled: No Pallor: No Moisture Rubor: No No Abnormalities Noted: No Dry / Scaly: Yes Temperature /  Pain Maceration: Yes Temperature: No Abnormality Electronic Signature(s) Signed: 02/19/2023 7:24:00 PM By: Shawn Stall RN, BSN Entered By: Shawn Stall on 02/19/2023 11:42:15 -------------------------------------------------------------------------------- Vitals Details Patient Name: Date of Service: Leonie Man NA 02/19/2023 11:15 A M Medical Record Number: [id number] Patient Account Number: [id number] Date of Birth/Sex: Treating RN: 30-Jun-1945 (77 y.o. F) Primary Care Cantrell Larouche: Daphine Deutscher, Mary-Margaret Other Clinician: Referring Amare Bail: Treating Eloise Mula/Extender: Lauretta Grill, Mary-Margaret Weeks in Treatment: 18 Vital Signs Time Taken: 11:15 Temperature (F): 97.9 Height (in): 62 Pulse (bpm): 49 Weight (lbs): 139 Respiratory Rate (breaths/min): 18 Body Mass Index (BMI): 25.4 Blood Pressure (mmHg): 154/80 Reference Range: 80 - 120 mg / dl Electronic Signature(s) Signed: 02/19/2023 4:28:55 PM By: Thayer Dallas Entered By: Thayer Dallas on 02/19/2023 11:16:05

## 2023-02-27 DIAGNOSIS — I82492 Acute embolism and thrombosis of other specified deep vein of left lower extremity: Secondary | ICD-10-CM | POA: Diagnosis not present

## 2023-02-27 DIAGNOSIS — I890 Lymphedema, not elsewhere classified: Secondary | ICD-10-CM | POA: Diagnosis not present

## 2023-02-27 DIAGNOSIS — Z79899 Other long term (current) drug therapy: Secondary | ICD-10-CM | POA: Diagnosis not present

## 2023-02-27 DIAGNOSIS — L03116 Cellulitis of left lower limb: Secondary | ICD-10-CM | POA: Diagnosis not present

## 2023-02-27 DIAGNOSIS — R52 Pain, unspecified: Secondary | ICD-10-CM | POA: Diagnosis not present

## 2023-03-05 ENCOUNTER — Encounter (HOSPITAL_BASED_OUTPATIENT_CLINIC_OR_DEPARTMENT_OTHER): Payer: Medicare Other | Admitting: Physician Assistant

## 2023-03-11 DIAGNOSIS — R262 Difficulty in walking, not elsewhere classified: Secondary | ICD-10-CM | POA: Diagnosis not present

## 2023-03-11 DIAGNOSIS — M6281 Muscle weakness (generalized): Secondary | ICD-10-CM | POA: Diagnosis not present

## 2023-03-11 DIAGNOSIS — R2689 Other abnormalities of gait and mobility: Secondary | ICD-10-CM | POA: Diagnosis not present

## 2023-03-11 DIAGNOSIS — I82402 Acute embolism and thrombosis of unspecified deep veins of left lower extremity: Secondary | ICD-10-CM | POA: Diagnosis not present

## 2023-03-12 DIAGNOSIS — R2689 Other abnormalities of gait and mobility: Secondary | ICD-10-CM | POA: Diagnosis not present

## 2023-03-12 DIAGNOSIS — R262 Difficulty in walking, not elsewhere classified: Secondary | ICD-10-CM | POA: Diagnosis not present

## 2023-03-12 DIAGNOSIS — M6281 Muscle weakness (generalized): Secondary | ICD-10-CM | POA: Diagnosis not present

## 2023-03-12 DIAGNOSIS — I82402 Acute embolism and thrombosis of unspecified deep veins of left lower extremity: Secondary | ICD-10-CM | POA: Diagnosis not present

## 2023-03-13 DIAGNOSIS — I890 Lymphedema, not elsewhere classified: Secondary | ICD-10-CM | POA: Diagnosis not present

## 2023-03-13 DIAGNOSIS — I872 Venous insufficiency (chronic) (peripheral): Secondary | ICD-10-CM | POA: Diagnosis not present

## 2023-03-14 DIAGNOSIS — M6281 Muscle weakness (generalized): Secondary | ICD-10-CM | POA: Diagnosis not present

## 2023-03-14 DIAGNOSIS — I82402 Acute embolism and thrombosis of unspecified deep veins of left lower extremity: Secondary | ICD-10-CM | POA: Diagnosis not present

## 2023-03-14 DIAGNOSIS — R2689 Other abnormalities of gait and mobility: Secondary | ICD-10-CM | POA: Diagnosis not present

## 2023-03-14 DIAGNOSIS — R262 Difficulty in walking, not elsewhere classified: Secondary | ICD-10-CM | POA: Diagnosis not present

## 2023-03-15 DIAGNOSIS — M6281 Muscle weakness (generalized): Secondary | ICD-10-CM | POA: Diagnosis not present

## 2023-03-15 DIAGNOSIS — R2689 Other abnormalities of gait and mobility: Secondary | ICD-10-CM | POA: Diagnosis not present

## 2023-03-15 DIAGNOSIS — R262 Difficulty in walking, not elsewhere classified: Secondary | ICD-10-CM | POA: Diagnosis not present

## 2023-03-15 DIAGNOSIS — I82402 Acute embolism and thrombosis of unspecified deep veins of left lower extremity: Secondary | ICD-10-CM | POA: Diagnosis not present

## 2023-03-17 DIAGNOSIS — R262 Difficulty in walking, not elsewhere classified: Secondary | ICD-10-CM | POA: Diagnosis not present

## 2023-03-17 DIAGNOSIS — I82402 Acute embolism and thrombosis of unspecified deep veins of left lower extremity: Secondary | ICD-10-CM | POA: Diagnosis not present

## 2023-03-17 DIAGNOSIS — R2689 Other abnormalities of gait and mobility: Secondary | ICD-10-CM | POA: Diagnosis not present

## 2023-03-17 DIAGNOSIS — M6281 Muscle weakness (generalized): Secondary | ICD-10-CM | POA: Diagnosis not present

## 2023-03-18 DIAGNOSIS — M6281 Muscle weakness (generalized): Secondary | ICD-10-CM | POA: Diagnosis not present

## 2023-03-18 DIAGNOSIS — R262 Difficulty in walking, not elsewhere classified: Secondary | ICD-10-CM | POA: Diagnosis not present

## 2023-03-18 DIAGNOSIS — R2689 Other abnormalities of gait and mobility: Secondary | ICD-10-CM | POA: Diagnosis not present

## 2023-03-18 DIAGNOSIS — I82402 Acute embolism and thrombosis of unspecified deep veins of left lower extremity: Secondary | ICD-10-CM | POA: Diagnosis not present

## 2023-03-20 DIAGNOSIS — I82402 Acute embolism and thrombosis of unspecified deep veins of left lower extremity: Secondary | ICD-10-CM | POA: Diagnosis not present

## 2023-03-20 DIAGNOSIS — R2689 Other abnormalities of gait and mobility: Secondary | ICD-10-CM | POA: Diagnosis not present

## 2023-03-20 DIAGNOSIS — M6281 Muscle weakness (generalized): Secondary | ICD-10-CM | POA: Diagnosis not present

## 2023-03-20 DIAGNOSIS — R262 Difficulty in walking, not elsewhere classified: Secondary | ICD-10-CM | POA: Diagnosis not present

## 2023-03-21 DIAGNOSIS — R262 Difficulty in walking, not elsewhere classified: Secondary | ICD-10-CM | POA: Diagnosis not present

## 2023-03-21 DIAGNOSIS — I82402 Acute embolism and thrombosis of unspecified deep veins of left lower extremity: Secondary | ICD-10-CM | POA: Diagnosis not present

## 2023-03-21 DIAGNOSIS — R2689 Other abnormalities of gait and mobility: Secondary | ICD-10-CM | POA: Diagnosis not present

## 2023-03-21 DIAGNOSIS — M6281 Muscle weakness (generalized): Secondary | ICD-10-CM | POA: Diagnosis not present

## 2023-03-22 DIAGNOSIS — R2689 Other abnormalities of gait and mobility: Secondary | ICD-10-CM | POA: Diagnosis not present

## 2023-03-22 DIAGNOSIS — M6281 Muscle weakness (generalized): Secondary | ICD-10-CM | POA: Diagnosis not present

## 2023-03-22 DIAGNOSIS — R262 Difficulty in walking, not elsewhere classified: Secondary | ICD-10-CM | POA: Diagnosis not present

## 2023-03-22 DIAGNOSIS — I82402 Acute embolism and thrombosis of unspecified deep veins of left lower extremity: Secondary | ICD-10-CM | POA: Diagnosis not present

## 2023-03-24 DIAGNOSIS — R2689 Other abnormalities of gait and mobility: Secondary | ICD-10-CM | POA: Diagnosis not present

## 2023-03-24 DIAGNOSIS — M170 Bilateral primary osteoarthritis of knee: Secondary | ICD-10-CM | POA: Diagnosis not present

## 2023-03-24 DIAGNOSIS — I82402 Acute embolism and thrombosis of unspecified deep veins of left lower extremity: Secondary | ICD-10-CM | POA: Diagnosis not present

## 2023-03-24 DIAGNOSIS — M6281 Muscle weakness (generalized): Secondary | ICD-10-CM | POA: Diagnosis not present

## 2023-03-24 DIAGNOSIS — R262 Difficulty in walking, not elsewhere classified: Secondary | ICD-10-CM | POA: Diagnosis not present

## 2023-03-25 DIAGNOSIS — R262 Difficulty in walking, not elsewhere classified: Secondary | ICD-10-CM | POA: Diagnosis not present

## 2023-03-25 DIAGNOSIS — M6281 Muscle weakness (generalized): Secondary | ICD-10-CM | POA: Diagnosis not present

## 2023-03-25 DIAGNOSIS — R2689 Other abnormalities of gait and mobility: Secondary | ICD-10-CM | POA: Diagnosis not present

## 2023-03-25 DIAGNOSIS — I82402 Acute embolism and thrombosis of unspecified deep veins of left lower extremity: Secondary | ICD-10-CM | POA: Diagnosis not present

## 2023-03-27 DIAGNOSIS — I82402 Acute embolism and thrombosis of unspecified deep veins of left lower extremity: Secondary | ICD-10-CM | POA: Diagnosis not present

## 2023-03-27 DIAGNOSIS — M6281 Muscle weakness (generalized): Secondary | ICD-10-CM | POA: Diagnosis not present

## 2023-03-27 DIAGNOSIS — R2689 Other abnormalities of gait and mobility: Secondary | ICD-10-CM | POA: Diagnosis not present

## 2023-03-27 DIAGNOSIS — R262 Difficulty in walking, not elsewhere classified: Secondary | ICD-10-CM | POA: Diagnosis not present

## 2023-03-28 DIAGNOSIS — R296 Repeated falls: Secondary | ICD-10-CM | POA: Diagnosis not present

## 2023-03-28 DIAGNOSIS — R2689 Other abnormalities of gait and mobility: Secondary | ICD-10-CM | POA: Diagnosis not present

## 2023-03-28 DIAGNOSIS — I82402 Acute embolism and thrombosis of unspecified deep veins of left lower extremity: Secondary | ICD-10-CM | POA: Diagnosis not present

## 2023-03-28 DIAGNOSIS — M6281 Muscle weakness (generalized): Secondary | ICD-10-CM | POA: Diagnosis not present

## 2023-03-28 DIAGNOSIS — R262 Difficulty in walking, not elsewhere classified: Secondary | ICD-10-CM | POA: Diagnosis not present

## 2023-03-29 DIAGNOSIS — R2689 Other abnormalities of gait and mobility: Secondary | ICD-10-CM | POA: Diagnosis not present

## 2023-03-29 DIAGNOSIS — M6281 Muscle weakness (generalized): Secondary | ICD-10-CM | POA: Diagnosis not present

## 2023-03-29 DIAGNOSIS — R296 Repeated falls: Secondary | ICD-10-CM | POA: Diagnosis not present

## 2023-03-29 DIAGNOSIS — I82402 Acute embolism and thrombosis of unspecified deep veins of left lower extremity: Secondary | ICD-10-CM | POA: Diagnosis not present

## 2023-03-29 DIAGNOSIS — R262 Difficulty in walking, not elsewhere classified: Secondary | ICD-10-CM | POA: Diagnosis not present

## 2023-03-31 DIAGNOSIS — M6281 Muscle weakness (generalized): Secondary | ICD-10-CM | POA: Diagnosis not present

## 2023-03-31 DIAGNOSIS — R2689 Other abnormalities of gait and mobility: Secondary | ICD-10-CM | POA: Diagnosis not present

## 2023-03-31 DIAGNOSIS — R262 Difficulty in walking, not elsewhere classified: Secondary | ICD-10-CM | POA: Diagnosis not present

## 2023-03-31 DIAGNOSIS — I82402 Acute embolism and thrombosis of unspecified deep veins of left lower extremity: Secondary | ICD-10-CM | POA: Diagnosis not present

## 2023-03-31 DIAGNOSIS — R296 Repeated falls: Secondary | ICD-10-CM | POA: Diagnosis not present

## 2023-04-01 ENCOUNTER — Telehealth: Payer: Self-pay | Admitting: Nurse Practitioner

## 2023-04-01 DIAGNOSIS — R262 Difficulty in walking, not elsewhere classified: Secondary | ICD-10-CM | POA: Diagnosis not present

## 2023-04-01 DIAGNOSIS — M6281 Muscle weakness (generalized): Secondary | ICD-10-CM | POA: Diagnosis not present

## 2023-04-01 DIAGNOSIS — I82402 Acute embolism and thrombosis of unspecified deep veins of left lower extremity: Secondary | ICD-10-CM | POA: Diagnosis not present

## 2023-04-01 DIAGNOSIS — R296 Repeated falls: Secondary | ICD-10-CM | POA: Diagnosis not present

## 2023-04-01 DIAGNOSIS — R2689 Other abnormalities of gait and mobility: Secondary | ICD-10-CM | POA: Diagnosis not present

## 2023-04-01 NOTE — Telephone Encounter (Signed)
NA/VM full, pt NTBS last OV was last December 2023

## 2023-04-02 DIAGNOSIS — R296 Repeated falls: Secondary | ICD-10-CM | POA: Diagnosis not present

## 2023-04-02 DIAGNOSIS — R262 Difficulty in walking, not elsewhere classified: Secondary | ICD-10-CM | POA: Diagnosis not present

## 2023-04-02 DIAGNOSIS — M6281 Muscle weakness (generalized): Secondary | ICD-10-CM | POA: Diagnosis not present

## 2023-04-02 DIAGNOSIS — I82402 Acute embolism and thrombosis of unspecified deep veins of left lower extremity: Secondary | ICD-10-CM | POA: Diagnosis not present

## 2023-04-02 DIAGNOSIS — R2689 Other abnormalities of gait and mobility: Secondary | ICD-10-CM | POA: Diagnosis not present

## 2023-04-02 NOTE — Telephone Encounter (Signed)
Copied from CRM [id number]. Topic: General - Other >> Apr 02, 2023  2:02 PM Almira Coaster wrote: Reason for CRM: Patient received a call yesterday but did not get a voice mail, she asked to be called to [phone].

## 2023-04-03 DIAGNOSIS — R262 Difficulty in walking, not elsewhere classified: Secondary | ICD-10-CM | POA: Diagnosis not present

## 2023-04-03 DIAGNOSIS — R296 Repeated falls: Secondary | ICD-10-CM | POA: Diagnosis not present

## 2023-04-03 DIAGNOSIS — R2689 Other abnormalities of gait and mobility: Secondary | ICD-10-CM | POA: Diagnosis not present

## 2023-04-03 DIAGNOSIS — I82402 Acute embolism and thrombosis of unspecified deep veins of left lower extremity: Secondary | ICD-10-CM | POA: Diagnosis not present

## 2023-04-03 DIAGNOSIS — M6281 Muscle weakness (generalized): Secondary | ICD-10-CM | POA: Diagnosis not present

## 2023-04-04 DIAGNOSIS — R2689 Other abnormalities of gait and mobility: Secondary | ICD-10-CM | POA: Diagnosis not present

## 2023-04-04 DIAGNOSIS — I82402 Acute embolism and thrombosis of unspecified deep veins of left lower extremity: Secondary | ICD-10-CM | POA: Diagnosis not present

## 2023-04-04 DIAGNOSIS — R262 Difficulty in walking, not elsewhere classified: Secondary | ICD-10-CM | POA: Diagnosis not present

## 2023-04-04 DIAGNOSIS — R296 Repeated falls: Secondary | ICD-10-CM | POA: Diagnosis not present

## 2023-04-04 DIAGNOSIS — M6281 Muscle weakness (generalized): Secondary | ICD-10-CM | POA: Diagnosis not present

## 2023-04-07 DIAGNOSIS — I82402 Acute embolism and thrombosis of unspecified deep veins of left lower extremity: Secondary | ICD-10-CM | POA: Diagnosis not present

## 2023-04-07 DIAGNOSIS — R296 Repeated falls: Secondary | ICD-10-CM | POA: Diagnosis not present

## 2023-04-07 DIAGNOSIS — R262 Difficulty in walking, not elsewhere classified: Secondary | ICD-10-CM | POA: Diagnosis not present

## 2023-04-07 DIAGNOSIS — R2689 Other abnormalities of gait and mobility: Secondary | ICD-10-CM | POA: Diagnosis not present

## 2023-04-07 DIAGNOSIS — Z79899 Other long term (current) drug therapy: Secondary | ICD-10-CM | POA: Diagnosis not present

## 2023-04-07 DIAGNOSIS — I890 Lymphedema, not elsewhere classified: Secondary | ICD-10-CM | POA: Diagnosis not present

## 2023-04-07 DIAGNOSIS — M6281 Muscle weakness (generalized): Secondary | ICD-10-CM | POA: Diagnosis not present

## 2023-04-07 DIAGNOSIS — I5032 Chronic diastolic (congestive) heart failure: Secondary | ICD-10-CM | POA: Diagnosis not present

## 2023-04-08 DIAGNOSIS — R2689 Other abnormalities of gait and mobility: Secondary | ICD-10-CM | POA: Diagnosis not present

## 2023-04-08 DIAGNOSIS — R296 Repeated falls: Secondary | ICD-10-CM | POA: Diagnosis not present

## 2023-04-08 DIAGNOSIS — I82402 Acute embolism and thrombosis of unspecified deep veins of left lower extremity: Secondary | ICD-10-CM | POA: Diagnosis not present

## 2023-04-08 DIAGNOSIS — M6281 Muscle weakness (generalized): Secondary | ICD-10-CM | POA: Diagnosis not present

## 2023-04-08 DIAGNOSIS — R262 Difficulty in walking, not elsewhere classified: Secondary | ICD-10-CM | POA: Diagnosis not present

## 2023-04-09 DIAGNOSIS — R262 Difficulty in walking, not elsewhere classified: Secondary | ICD-10-CM | POA: Diagnosis not present

## 2023-04-09 DIAGNOSIS — M6281 Muscle weakness (generalized): Secondary | ICD-10-CM | POA: Diagnosis not present

## 2023-04-09 DIAGNOSIS — I82402 Acute embolism and thrombosis of unspecified deep veins of left lower extremity: Secondary | ICD-10-CM | POA: Diagnosis not present

## 2023-04-09 DIAGNOSIS — R296 Repeated falls: Secondary | ICD-10-CM | POA: Diagnosis not present

## 2023-04-09 DIAGNOSIS — R2689 Other abnormalities of gait and mobility: Secondary | ICD-10-CM | POA: Diagnosis not present

## 2023-04-10 DIAGNOSIS — L03116 Cellulitis of left lower limb: Secondary | ICD-10-CM | POA: Diagnosis not present

## 2023-04-10 DIAGNOSIS — M6281 Muscle weakness (generalized): Secondary | ICD-10-CM | POA: Diagnosis not present

## 2023-04-10 DIAGNOSIS — R262 Difficulty in walking, not elsewhere classified: Secondary | ICD-10-CM | POA: Diagnosis not present

## 2023-04-10 DIAGNOSIS — R296 Repeated falls: Secondary | ICD-10-CM | POA: Diagnosis not present

## 2023-04-10 DIAGNOSIS — Z79899 Other long term (current) drug therapy: Secondary | ICD-10-CM | POA: Diagnosis not present

## 2023-04-10 DIAGNOSIS — I82402 Acute embolism and thrombosis of unspecified deep veins of left lower extremity: Secondary | ICD-10-CM | POA: Diagnosis not present

## 2023-04-10 DIAGNOSIS — R2689 Other abnormalities of gait and mobility: Secondary | ICD-10-CM | POA: Diagnosis not present

## 2023-04-11 DIAGNOSIS — R262 Difficulty in walking, not elsewhere classified: Secondary | ICD-10-CM | POA: Diagnosis not present

## 2023-04-11 DIAGNOSIS — M6281 Muscle weakness (generalized): Secondary | ICD-10-CM | POA: Diagnosis not present

## 2023-04-11 DIAGNOSIS — R296 Repeated falls: Secondary | ICD-10-CM | POA: Diagnosis not present

## 2023-04-11 DIAGNOSIS — I82402 Acute embolism and thrombosis of unspecified deep veins of left lower extremity: Secondary | ICD-10-CM | POA: Diagnosis not present

## 2023-04-11 DIAGNOSIS — R2689 Other abnormalities of gait and mobility: Secondary | ICD-10-CM | POA: Diagnosis not present

## 2023-04-13 DIAGNOSIS — R296 Repeated falls: Secondary | ICD-10-CM | POA: Diagnosis not present

## 2023-04-13 DIAGNOSIS — I82402 Acute embolism and thrombosis of unspecified deep veins of left lower extremity: Secondary | ICD-10-CM | POA: Diagnosis not present

## 2023-04-13 DIAGNOSIS — R2689 Other abnormalities of gait and mobility: Secondary | ICD-10-CM | POA: Diagnosis not present

## 2023-04-13 DIAGNOSIS — R262 Difficulty in walking, not elsewhere classified: Secondary | ICD-10-CM | POA: Diagnosis not present

## 2023-04-13 DIAGNOSIS — M6281 Muscle weakness (generalized): Secondary | ICD-10-CM | POA: Diagnosis not present

## 2023-04-14 DIAGNOSIS — R296 Repeated falls: Secondary | ICD-10-CM | POA: Diagnosis not present

## 2023-04-14 DIAGNOSIS — R262 Difficulty in walking, not elsewhere classified: Secondary | ICD-10-CM | POA: Diagnosis not present

## 2023-04-14 DIAGNOSIS — I82402 Acute embolism and thrombosis of unspecified deep veins of left lower extremity: Secondary | ICD-10-CM | POA: Diagnosis not present

## 2023-04-14 DIAGNOSIS — R2689 Other abnormalities of gait and mobility: Secondary | ICD-10-CM | POA: Diagnosis not present

## 2023-04-14 DIAGNOSIS — M6281 Muscle weakness (generalized): Secondary | ICD-10-CM | POA: Diagnosis not present

## 2023-04-15 DIAGNOSIS — M6281 Muscle weakness (generalized): Secondary | ICD-10-CM | POA: Diagnosis not present

## 2023-04-15 DIAGNOSIS — F419 Anxiety disorder, unspecified: Secondary | ICD-10-CM | POA: Diagnosis not present

## 2023-04-15 DIAGNOSIS — Z79899 Other long term (current) drug therapy: Secondary | ICD-10-CM | POA: Diagnosis not present

## 2023-04-15 DIAGNOSIS — R2689 Other abnormalities of gait and mobility: Secondary | ICD-10-CM | POA: Diagnosis not present

## 2023-04-15 DIAGNOSIS — I5032 Chronic diastolic (congestive) heart failure: Secondary | ICD-10-CM | POA: Diagnosis not present

## 2023-04-15 DIAGNOSIS — D649 Anemia, unspecified: Secondary | ICD-10-CM | POA: Diagnosis not present

## 2023-04-15 DIAGNOSIS — I700 Atherosclerosis of aorta: Secondary | ICD-10-CM | POA: Diagnosis not present

## 2023-04-15 DIAGNOSIS — R296 Repeated falls: Secondary | ICD-10-CM | POA: Diagnosis not present

## 2023-04-15 DIAGNOSIS — J449 Chronic obstructive pulmonary disease, unspecified: Secondary | ICD-10-CM | POA: Diagnosis not present

## 2023-04-15 DIAGNOSIS — R52 Pain, unspecified: Secondary | ICD-10-CM | POA: Diagnosis not present

## 2023-04-15 DIAGNOSIS — E785 Hyperlipidemia, unspecified: Secondary | ICD-10-CM | POA: Diagnosis not present

## 2023-04-15 DIAGNOSIS — I890 Lymphedema, not elsewhere classified: Secondary | ICD-10-CM | POA: Diagnosis not present

## 2023-04-15 DIAGNOSIS — J9601 Acute respiratory failure with hypoxia: Secondary | ICD-10-CM | POA: Diagnosis not present

## 2023-04-15 DIAGNOSIS — L03115 Cellulitis of right lower limb: Secondary | ICD-10-CM | POA: Diagnosis not present

## 2023-04-15 DIAGNOSIS — L03116 Cellulitis of left lower limb: Secondary | ICD-10-CM | POA: Diagnosis not present

## 2023-04-15 DIAGNOSIS — R262 Difficulty in walking, not elsewhere classified: Secondary | ICD-10-CM | POA: Diagnosis not present

## 2023-04-15 DIAGNOSIS — I82402 Acute embolism and thrombosis of unspecified deep veins of left lower extremity: Secondary | ICD-10-CM | POA: Diagnosis not present

## 2023-04-16 DIAGNOSIS — R262 Difficulty in walking, not elsewhere classified: Secondary | ICD-10-CM | POA: Diagnosis not present

## 2023-04-16 DIAGNOSIS — R296 Repeated falls: Secondary | ICD-10-CM | POA: Diagnosis not present

## 2023-04-16 DIAGNOSIS — R2689 Other abnormalities of gait and mobility: Secondary | ICD-10-CM | POA: Diagnosis not present

## 2023-04-16 DIAGNOSIS — M6281 Muscle weakness (generalized): Secondary | ICD-10-CM | POA: Diagnosis not present

## 2023-04-16 DIAGNOSIS — I82402 Acute embolism and thrombosis of unspecified deep veins of left lower extremity: Secondary | ICD-10-CM | POA: Diagnosis not present

## 2023-04-18 ENCOUNTER — Ambulatory Visit: Payer: Medicare Other | Admitting: Nurse Practitioner

## 2023-04-21 DIAGNOSIS — G894 Chronic pain syndrome: Secondary | ICD-10-CM | POA: Diagnosis not present

## 2023-04-21 DIAGNOSIS — Z86718 Personal history of other venous thrombosis and embolism: Secondary | ICD-10-CM | POA: Diagnosis not present

## 2023-04-21 DIAGNOSIS — F0150 Vascular dementia without behavioral disturbance: Secondary | ICD-10-CM | POA: Diagnosis not present

## 2023-04-21 DIAGNOSIS — I482 Chronic atrial fibrillation, unspecified: Secondary | ICD-10-CM | POA: Diagnosis not present

## 2023-04-21 DIAGNOSIS — E039 Hypothyroidism, unspecified: Secondary | ICD-10-CM | POA: Diagnosis not present

## 2023-04-21 DIAGNOSIS — R609 Edema, unspecified: Secondary | ICD-10-CM | POA: Diagnosis not present

## 2023-04-21 DIAGNOSIS — F411 Generalized anxiety disorder: Secondary | ICD-10-CM | POA: Diagnosis not present

## 2023-04-21 DIAGNOSIS — I509 Heart failure, unspecified: Secondary | ICD-10-CM | POA: Diagnosis not present

## 2023-04-21 DIAGNOSIS — I700 Atherosclerosis of aorta: Secondary | ICD-10-CM | POA: Diagnosis not present

## 2023-04-21 DIAGNOSIS — D509 Iron deficiency anemia, unspecified: Secondary | ICD-10-CM | POA: Diagnosis not present

## 2023-04-21 DIAGNOSIS — I890 Lymphedema, not elsewhere classified: Secondary | ICD-10-CM | POA: Diagnosis not present

## 2023-04-21 DIAGNOSIS — G2581 Restless legs syndrome: Secondary | ICD-10-CM | POA: Diagnosis not present

## 2023-04-22 ENCOUNTER — Encounter: Payer: Self-pay | Admitting: Nurse Practitioner

## 2023-04-28 DIAGNOSIS — F411 Generalized anxiety disorder: Secondary | ICD-10-CM | POA: Diagnosis not present

## 2023-04-28 DIAGNOSIS — G2581 Restless legs syndrome: Secondary | ICD-10-CM | POA: Diagnosis not present

## 2023-04-28 DIAGNOSIS — G894 Chronic pain syndrome: Secondary | ICD-10-CM | POA: Diagnosis not present

## 2023-04-28 DIAGNOSIS — F0150 Vascular dementia without behavioral disturbance: Secondary | ICD-10-CM | POA: Diagnosis not present

## 2023-05-05 DIAGNOSIS — I890 Lymphedema, not elsewhere classified: Secondary | ICD-10-CM | POA: Diagnosis not present

## 2023-05-05 DIAGNOSIS — G894 Chronic pain syndrome: Secondary | ICD-10-CM | POA: Diagnosis not present

## 2023-05-05 DIAGNOSIS — L299 Pruritus, unspecified: Secondary | ICD-10-CM | POA: Diagnosis not present

## 2023-05-09 ENCOUNTER — Encounter: Payer: Self-pay | Admitting: Cardiology

## 2023-05-09 ENCOUNTER — Ambulatory Visit: Payer: Medicare Other | Attending: Cardiology | Admitting: Cardiology

## 2023-05-09 VITALS — BP 150/80 | HR 66 | Ht 62.0 in | Wt 191.4 lb

## 2023-05-09 DIAGNOSIS — Z79899 Other long term (current) drug therapy: Secondary | ICD-10-CM | POA: Diagnosis not present

## 2023-05-09 DIAGNOSIS — I4891 Unspecified atrial fibrillation: Secondary | ICD-10-CM | POA: Insufficient documentation

## 2023-05-09 DIAGNOSIS — I10 Essential (primary) hypertension: Secondary | ICD-10-CM | POA: Diagnosis not present

## 2023-05-09 DIAGNOSIS — I5033 Acute on chronic diastolic (congestive) heart failure: Secondary | ICD-10-CM | POA: Diagnosis not present

## 2023-05-09 MED ORDER — FUROSEMIDE 40 MG PO TABS: 40.0000 mg | ORAL_TABLET | Freq: Every day | ORAL | 3 refills | Status: DC

## 2023-05-09 NOTE — Progress Notes (Signed)
Clinical Summary [name] is a 77 y.o.female seen today for follow up of the following medical problems.    1. Leg edema/Chronic diastolic HF  -11/2015 LE venous US [place]: no DVT -09/2017 echo LVEF 60-65%, no WMAs, Diastolic function is abnormal, indeterminant grade   - was seen in lymphedema clinic, started on home pump      - incrased leg swelling and abdominal distension, denies specific SOB/DOE - Jan 2024 136 lbs July 158 lbs, today 191 - has lymphedema pump, uses few times a week - has been on lasix 20mg  daily.      2.HTN - she is compalint with meds    4.Afib, isolated episode - new diagnosis 12/2021 during admission with septic shock on pressors, vent. Also SBO that admission - started on amio at that time.  - 30 day monitor without recurrent afib - no recent palpitations.   6. DVT - anticoag complicated by GI bleed - has IVC filter - on eliquis  7. Cdiff colitis admit 09/2022   Past Medical History:  Diagnosis Date   Allergy    Bradycardia    Bronchitis, chronic (HCC)    Chronic anxiety    Complication of anesthesia    hard to wake up   COPD (chronic obstructive pulmonary disease) (HCC)    Depression    Esophageal reflux    Headache(784.0)    Hyperlipidemia    Hypertension    Hypothyroidism    Obesity    Osteoporosis    Overactive bladder    PVC's (premature ventricular contractions)    Thyroid disease    Vitamin D deficiency disease      Allergies  Allergen Reactions   Celebrex [Celecoxib] Swelling   Keflex [Cephalexin] Swelling   Penicillins Hives and Swelling   Sulfa Antibiotics Swelling   Kenalog [Triamcinolone Acetonide] Other (See Comments)    Unknown reaction   Latex Other (See Comments)    Unknown reaction   Mobic [Meloxicam] Swelling   Zestril [Lisinopril] Other (See Comments)    Unknown reaction     Current Outpatient Medications  Medication Sig Dispense Refill   acetaminophen (TYLENOL) 325 MG tablet Take 2  tablets (650 mg total) by mouth every 6 (six) hours as needed for mild pain (or Fever >/= 101). 100 tablet 1   Amino Acids-Protein Hydrolys (FEEDING SUPPLEMENT, PRO-STAT 64,) LIQD Take 30 mLs by mouth 2 (two) times daily.     Apixaban Starter Pack, 10mg  and 5mg , (ELIQUIS DVT/PE STARTER PACK) Take as directed on package: start with two-5mg  tablets twice daily for 7 days. On day 8, switch to one-5mg  tablet twice daily. (Patient taking differently: Take 5 mg by mouth 2 (two) times daily.) 1 each 0   ascorbic acid (VITAMIN C) 500 MG tablet Take 500 mg by mouth 2 (two) times daily.     Cholecalciferol (VITAMIN D3) 50 MCG (2000 UT) CAPS Take 2,000 Units by mouth every morning.     citalopram (CELEXA) 40 MG tablet Take 1 tablet (40 mg total) by mouth daily. 90 tablet 1   ferrous sulfate 325 (65 FE) MG tablet Take 1 tablet (325 mg total) by mouth daily with breakfast. 30 tablet 5   furosemide (LASIX) 20 MG tablet Take 1 tablet (20 mg total) by mouth daily. 30 tablet 0   glucose 4 GM chewable tablet Chew 1 tablet (4 g total) by mouth as needed for low blood sugar. 50 tablet 12   levothyroxine (SYNTHROID) 75 MCG tablet  Take 1 tablet (75 mcg total) by mouth daily. 90 tablet 1   magnesium oxide (MAG-OX) 400 (240 Mg) MG tablet Take 400 mg by mouth daily.     mirtazapine (REMERON) 7.5 MG tablet Take 1 tablet (7.5 mg total) by mouth at bedtime. For appetite stimulation 30 tablet 1   Multiple Vitamin (MULTIVITAMIN) tablet Take 1 tablet by mouth daily.     nutrition supplement, JUVEN, (JUVEN) PACK Take 1 packet by mouth 2 (two) times daily.     oxyCODONE (OXY IR/ROXICODONE) 5 MG immediate release tablet Take 1 tablet (5 mg total) by mouth daily as needed for severe pain. 10 tablet 0   rOPINIRole (REQUIP XL) 2 MG 24 hr tablet Take 1 tablet (2 mg total) by mouth at bedtime. 10 tablet 0   sodium bicarbonate 650 MG tablet Take 1 tablet (650 mg total) by mouth 3 (three) times daily. 90 tablet 2   Zinc 220 (50 Zn) MG CAPS  Take 220 mg by mouth every morning. 30 capsule 5   No current facility-administered medications for this visit.     Past Surgical History:  Procedure Laterality Date   ABDOMINAL HYSTERECTOMY     CENTRAL LINE INSERTION Right 01/10/2022   Procedure: CENTRAL LINE INSERTION;  Surgeon: Lucretia Roers, MD;  Location: [facility];  Service: General;  Laterality: Right;   CHOLECYSTECTOMY N/A 11/04/2013   Procedure: LAPAROSCOPIC CHOLECYSTECTOMY WITH INTRAOPERATIVE CHOLANGIOGRAM;  Surgeon: Wilmon Arms. Corliss Skains, MD;  Location: [facility];  Service: General;  Laterality: N/A;   COLONOSCOPY     FRACTURE SURGERY     pins removed from Hip surgery   HIP FRACTURE SURGERY  1990    pins removed in 1991   IR FLUORO GUIDE CV LINE LEFT  01/31/2022   IR IVC FILTER PLMT / S&I /IMG GUID/MOD SED  10/25/2022   IR US GUIDE VASC ACCESS LEFT  01/31/2022   LAPAROTOMY N/A 01/10/2022   Procedure: EXPLORATORY LAPAROTOMY,;  Surgeon: Lucretia Roers, MD;  Location: [facility];  Service: General;  Laterality: N/A;   LYSIS OF ADHESION N/A 01/10/2022   Procedure: LYSIS OF ADHESION;  Surgeon: Lucretia Roers, MD;  Location: [facility];  Service: General;  Laterality: N/A;   PARTIAL HYSTERECTOMY  1987   SPINAL FUSION     THORACENTESIS N/A 01/18/2022   Procedure: THORACENTESIS;  Surgeon: Lorin Glass, MD;  Location: [facility];  Service: Pulmonary;  Laterality: N/A;   UPPER GASTROINTESTINAL ENDOSCOPY       Allergies  Allergen Reactions   Celebrex [Celecoxib] Swelling   Keflex [Cephalexin] Swelling   Penicillins Hives and Swelling   Sulfa Antibiotics Swelling   Kenalog [Triamcinolone Acetonide] Other (See Comments)    Unknown reaction   Latex Other (See Comments)    Unknown reaction   Mobic [Meloxicam] Swelling   Zestril [Lisinopril] Other (See Comments)    Unknown reaction      Family History  Problem Relation Age of Onset   Arthritis Other    Heart disease Other    Cancer Other    Diabetes Other    OCD Other       Social History Ms. Renovato reports that she has never smoked. She has never used smokeless tobacco. Ms. Arrazola reports no history of alcohol use.   Review of Systems CONSTITUTIONAL: No weight loss, fever, chills, weakness or fatigue.  HEENT: Eyes: No visual loss, blurred vision, double vision or yellow sclerae.No hearing loss, sneezing, congestion, runny nose or sore throat.  SKIN:  No rash or itching.  CARDIOVASCULAR: per hpi RESPIRATORY: No shortness of breath, cough or sputum.  GASTROINTESTINAL: No anorexia, nausea, vomiting or diarrhea. No abdominal pain or blood.  GENITOURINARY: No burning on urination, no polyuria NEUROLOGICAL: No headache, dizziness, syncope, paralysis, ataxia, numbness or tingling in the extremities. No change in bowel or bladder control.  MUSCULOSKELETAL: No muscle, back pain, joint pain or stiffness.  LYMPHATICS: No enlarged nodes. No history of splenectomy.  PSYCHIATRIC: No history of depression or anxiety.  ENDOCRINOLOGIC: No reports of sweating, cold or heat intolerance. No polyuria or polydipsia.  Marland Kitchen   Physical Examination Today's Vitals   05/09/23 1127 05/09/23 1133  BP: (!) 152/80 (!) 150/80  Pulse: 66   SpO2: 97%   Weight: 191 lb 6.4 oz (86.8 kg)   Height: 5\' 2"  (1.575 m)    Body mass index is 35.01 kg/m.  Gen: resting comfortably, no acute distress HEENT: no scleral icterus, pupils equal round and reactive, no palptable cervical adenopathy,  CV: RRR, no mrg, no jvd Resp: Clear to auscultation bilaterally GI: abdomen is soft, non-tender, non-distended, normal bowel sounds, no hepatosplenomegaly MSK: extremities are warm, no edema.  Skin: warm, no rash Neuro:  no focal deficits Psych: appropriate affect   Diagnostic Studies 09/2017 echo Study Conclusions   - Left ventricle: The cavity size was normal. Wall thickness was   increased in a pattern of mild LVH. Systolic function was normal.   The estimated ejection fraction was in the  range of 60% to 65%.   Wall motion was normal; there were no regional wall motion   abnormalities. - Aortic valve: Mildly calcified annulus. Trileaflet; mildly   thickened leaflets. Mean gradient (S): 8 mm Hg. Valve area (VTI):   2.13 cm^2. Valve area (Vmax): 1.86 cm^2. Valve area (Vmean): 2.05   cm^2. - Left atrium: The atrium was severely dilated. - Right atrium: The atrium was mildly dilated. - Technically adequate study.   12/2021 echo IMPRESSIONS     1. Left ventricular ejection fraction, by estimation, is 60 to 65%. The  left ventricle has normal function. The left ventricle has no regional  wall motion abnormalities. Left ventricular diastolic parameters were  normal.   2. Right ventricular systolic function is normal. The right ventricular  size is normal. There is normal pulmonary artery systolic pressure.   3. Left atrial size was mildly dilated.   4. Right atrial size was mildly dilated.   5. Calcified subchordal apparatus. The mitral valve is abnormal. Trivial  mitral valve regurgitation. No evidence of mitral stenosis.   6. The aortic valve is tricuspid. There is mild calcification of the  aortic valve. Aortic valve regurgitation is not visualized. Aortic valve  sclerosis is present, with no evidence of aortic valve stenosis.   7. The inferior vena cava is normal in size with greater than 50%  respiratory variability, suggesting right atrial pressure of 3 mmHg.         Assessment and Plan  1. Leg edema/Acute on chronic HFpEF/Lymphedema - significant uptrend in weight about 30 lbs since July, increased leg edema and abdominal swelling - increase lasix to 40mg  daily - check bmet/mg/bnp in 2 weeks - continue home lymphedema pump treatments.    2. HTN - elevated, follow with diuresis.    3.Afib, isolated episode - occurred in settnig of septic shock,respiratory failure - no evidence of recurrence - on anticoag for recent DVT, had not been committed long term  for afib  F/u 3-4 weeks reasess volume status    Antoine Poche, M.D.

## 2023-05-09 NOTE — Patient Instructions (Addendum)
Medication Instructions:  Your physician has recommended you make the following change in your medication:  Increase furosemide to 40 mg daily Continue all other medications as prescribed  Labwork: BMET, BNP & Magnesium level in 2 weeks (05/23/2023) Non-fasting [facility] ([address] [place])  Testing/Procedures: none  Follow-Up: Your physician recommends that you schedule a follow-up appointment in: 3-4 weeks with Sharlene Dory NP  Any Other Special Instructions Will Be Listed Below (If Applicable).  If you need a refill on your cardiac medications before your next appointment, please call your pharmacy.

## 2023-05-12 DIAGNOSIS — N184 Chronic kidney disease, stage 4 (severe): Secondary | ICD-10-CM | POA: Diagnosis not present

## 2023-05-12 DIAGNOSIS — L299 Pruritus, unspecified: Secondary | ICD-10-CM | POA: Diagnosis not present

## 2023-05-12 DIAGNOSIS — I509 Heart failure, unspecified: Secondary | ICD-10-CM | POA: Diagnosis not present

## 2023-05-23 DIAGNOSIS — I509 Heart failure, unspecified: Secondary | ICD-10-CM | POA: Diagnosis not present

## 2023-05-23 DIAGNOSIS — I890 Lymphedema, not elsewhere classified: Secondary | ICD-10-CM | POA: Diagnosis not present

## 2023-05-23 DIAGNOSIS — I700 Atherosclerosis of aorta: Secondary | ICD-10-CM | POA: Diagnosis not present

## 2023-05-23 DIAGNOSIS — Z86718 Personal history of other venous thrombosis and embolism: Secondary | ICD-10-CM | POA: Diagnosis not present

## 2023-05-23 DIAGNOSIS — I4891 Unspecified atrial fibrillation: Secondary | ICD-10-CM | POA: Diagnosis not present

## 2023-05-26 DIAGNOSIS — I5032 Chronic diastolic (congestive) heart failure: Secondary | ICD-10-CM | POA: Diagnosis not present

## 2023-05-26 DIAGNOSIS — Z79899 Other long term (current) drug therapy: Secondary | ICD-10-CM | POA: Diagnosis not present

## 2023-05-27 LAB — BASIC METABOLIC PANEL
BUN/Creatinine Ratio: 17 (ref 12–28)
BUN: 40 mg/dL — ABNORMAL HIGH (ref 8–27)
CO2: 24 mmol/L (ref 20–29)
Calcium: 9.4 mg/dL (ref 8.7–10.3)
Chloride: 102 mmol/L (ref 96–106)
Creatinine, Ser: 2.42 mg/dL — ABNORMAL HIGH (ref 0.57–1.00)
Glucose: 83 mg/dL (ref 70–99)
Potassium: 4.2 mmol/L (ref 3.5–5.2)
Sodium: 140 mmol/L (ref 134–144)
eGFR: 20 mL/min/{1.73_m2} — ABNORMAL LOW (ref >59)

## 2023-05-27 LAB — BRAIN NATRIURETIC PEPTIDE: BNP: 175 pg/mL — ABNORMAL HIGH (ref 0.0–100.0)

## 2023-05-27 LAB — MAGNESIUM: Magnesium: 2.2 mg/dL (ref 1.6–2.3)

## 2023-06-17 ENCOUNTER — Ambulatory Visit: Payer: Medicare Other | Attending: Nurse Practitioner | Admitting: Nurse Practitioner

## 2023-06-17 ENCOUNTER — Encounter: Payer: Self-pay | Admitting: Nurse Practitioner

## 2023-06-17 VITALS — BP 124/72 | HR 60 | Ht 64.0 in | Wt 194.0 lb

## 2023-06-17 DIAGNOSIS — Z86718 Personal history of other venous thrombosis and embolism: Secondary | ICD-10-CM | POA: Diagnosis present

## 2023-06-17 DIAGNOSIS — I5033 Acute on chronic diastolic (congestive) heart failure: Secondary | ICD-10-CM | POA: Diagnosis present

## 2023-06-17 DIAGNOSIS — I4891 Unspecified atrial fibrillation: Secondary | ICD-10-CM | POA: Diagnosis present

## 2023-06-17 DIAGNOSIS — I10 Essential (primary) hypertension: Secondary | ICD-10-CM | POA: Diagnosis present

## 2023-06-17 DIAGNOSIS — Z86711 Personal history of pulmonary embolism: Secondary | ICD-10-CM

## 2023-06-17 DIAGNOSIS — R600 Localized edema: Secondary | ICD-10-CM

## 2023-06-17 DIAGNOSIS — I509 Heart failure, unspecified: Secondary | ICD-10-CM

## 2023-06-17 DIAGNOSIS — I890 Lymphedema, not elsewhere classified: Secondary | ICD-10-CM | POA: Diagnosis present

## 2023-06-17 DIAGNOSIS — Z79899 Other long term (current) drug therapy: Secondary | ICD-10-CM | POA: Insufficient documentation

## 2023-06-17 NOTE — Progress Notes (Unsigned)
Cardiology Office Note:  .   Date:  06/17/2023 ID:  Aleane, Perini Sep 29, 1945, MRN [id number] PCP: [name], FNP  [facility] Providers Cardiologist:  Dina Rich, MD    History of Present Illness: .   [name] is a 78 y.o. female with a PMH of chronic diastolic CHF, hypertension, history of C. difficile colitis, history of DVT, history of GI bleed, history of IVC filter, isolated episode of A-fib in 2023 in the setting of septic shock, and leg edema/lymphedema, CKD, presents today for 3 to 4-week follow-up.  Newly diagnosed with A-fib during hospital admission in 12/2021, was in septic shock, had SBO also at the time. 30 day monitor showed no recurrent A-fib. Hx of IVC filter and GI bleed in past while on OAC. Hx of past DVT.   Hospital admission 09/2022 for C-diff colitis.   Last seen by Dr. Dina Rich on May 09, 2023. Pt showed signs of volume overload with significant increase in weight, as well as increased abdominal swelling and leg edema. Lasix was increased to 40 mg daily, encouraged to continue home lymphedema pump treatments.   Today she presents for 3-4 week follow-up. She states she has not noticed a difference in her weight/swelling since she was last in our office. Weight is up 3 lbs from last office visit. Says she is working to get better at using her compression pumps. Does admit to some dietary indiscretions. Denies any chest pain, shortness of breath, palpitations, syncope, presyncope, dizziness, orthopnea, PND, acute bleeding, or claudication.  ROS: Negative. See HPI.  SH: Lives at [facility]).  Studies Reviewed: .    Echo 09/2022:  1. Left ventricular ejection fraction, by estimation, is 65 to 70%. The  left ventricle has normal function. The left ventricle has no regional  wall motion abnormalities. Left ventricular diastolic parameters are  consistent with Grade I diastolic  dysfunction (impaired  relaxation).   2. Right ventricular systolic function is normal. The right ventricular  size is normal. There is mildly elevated pulmonary artery systolic  pressure. The estimated right ventricular systolic pressure is 40.0 mmHg.   3. Left atrial size was moderately dilated.   4. The mitral valve is normal in structure. Trivial mitral valve  regurgitation.   5. The aortic valve is tricuspid. Aortic valve regurgitation is trivial.  Aortic valve sclerosis/calcification is present, without any evidence of  aortic stenosis.   6. The inferior vena cava is dilated in size with <50% respiratory  variability, suggesting right atrial pressure of 15 mmHg.   7. Cannot exclude a small PFO.   Comparison(s): Changes from prior study are noted. 05/21/2022: LVEF 60-65%, normal diastolic function.   Cardiac monitor 04/2022:   30 day monitor   Min HR 39, max HR 110, avg HR 53   No symptoms reported   Telemetry tracings show sinus rhythm,rare short runs of NSVT   No afib or aflutter noted Risk Assessment/Calculations:           The 10-year ASCVD risk score (Arnett DK, et al., 2019) is: 21.8%   Values used to calculate the score:     Age: 77 years     Sex: Female     Is Non-Hispanic African American: No     Diabetic: No     Tobacco smoker: No     Systolic Blood Pressure: 124 mmHg     Is BP treated: Yes     HDL Cholesterol: 56 mg/dL  Total Cholesterol: 161 mg/dL  Physical Exam:   VS:  BP 124/72   Pulse 60   Ht 5\' 4"  (1.626 m)   Wt 194 lb (88 kg)   SpO2 98%   BMI 33.30 kg/m    Wt Readings from Last 3 Encounters:  06/17/23 194 lb (88 kg)  05/09/23 191 lb 6.4 oz (86.8 kg)  12/27/22 193 lb (87.5 kg)    GEN: Obese, 78 y.o. female in no acute distress, sitting in wheelchair NECK: No JVD; No carotid bruits CARDIAC: S1/S2, RRR, no murmurs, rubs, gallops RESPIRATORY:  Clear and diminished to auscultation without rales, wheezing or rhonchi  ABDOMEN: Soft, non-tender,  non-distended EXTREMITIES:  Gross generalized nonpitting edema with gross lymphedema to BLE; No deformity   ASSESSMENT AND PLAN: .    Acute on chronic diastolic CHF, lymphedema, medication management Stage C, NYHA class I-II symptoms. EF 65-70%. Significant weight gain, swelling, and lymphedema. Weight is up 3 lbs despite increase in Lasix. Will double Lasix and increase to Lasix 40 mg BID x 4 days, then return to normal dosing. Will obtain BMET, proBNP, and magnesium level in 1 week. Continue rest of medication regimen. Low sodium diet, fluid restriction <2L, and daily weights encouraged. Educated to contact our office for weight gain of 2 lbs overnight or 5 lbs in one week. Encouraged consistent use of compression pumps at home.   HTN BP stable. Discussed to monitor BP at home at least 2 hours after medications and sitting for 5-10 minutes. No medication changes at this time. Heart healthy diet and regular cardiovascular exercise encouraged.   Isolated A-fib episode, hx of DVT Denies any tachycardia or palpitations. Occurred 12/2021 in setting of septic shock, also tx for SBO at time. Past 30 day monitor did not show recurrence of A-fib. Remains on Eliquis and denies any bleeding issues, on appropriate dosage. Doppler 12/2022 of lower extremities was negative for DVT. Continue Eliquis 5 mg BID.    Dispo: Follow-up with me/APP in 4-6 weeks or sooner if anything changes.   Signed, Sharlene Dory, NP

## 2023-06-17 NOTE — Patient Instructions (Addendum)
Medication Instructions:  Your physician has recommended you make the following change in your medication:  Please Increase Lasix to 40 Mg Twice daily for 4 days, then reduce back down to 40 mg daily   Labwork: In 1-2 weeks   Testing/Procedures: None   Follow-Up: Your physician recommends that you schedule a follow-up appointment in: 4-6 weeks   Any Other Special Instructions Will Be Listed Below (If Applicable).   If you need a refill on your cardiac medications before your next appointment, please call your pharmacy.

## 2023-06-24 ENCOUNTER — Encounter: Payer: Self-pay | Admitting: *Deleted

## 2023-07-24 ENCOUNTER — Ambulatory Visit: Payer: Medicare Other | Admitting: Nurse Practitioner

## 2023-07-31 ENCOUNTER — Ambulatory Visit: Payer: Medicare Other | Admitting: Nurse Practitioner

## 2023-08-08 ENCOUNTER — Encounter (HOSPITAL_COMMUNITY): Payer: Self-pay

## 2023-08-08 ENCOUNTER — Other Ambulatory Visit: Payer: Self-pay

## 2023-08-08 ENCOUNTER — Emergency Department (HOSPITAL_COMMUNITY)
Admission: EM | Admit: 2023-08-08 | Discharge: 2023-08-08 | Disposition: A | Discharge status: Home / Self Care | Attending: Emergency Medicine | Admitting: Emergency Medicine

## 2023-08-08 DIAGNOSIS — Z91040 Latex allergy status: Secondary | ICD-10-CM | POA: Diagnosis not present

## 2023-08-08 DIAGNOSIS — I890 Lymphedema, not elsewhere classified: Secondary | ICD-10-CM | POA: Insufficient documentation

## 2023-08-08 DIAGNOSIS — R21 Rash and other nonspecific skin eruption: Secondary | ICD-10-CM | POA: Insufficient documentation

## 2023-08-08 DIAGNOSIS — M7989 Other specified soft tissue disorders: Secondary | ICD-10-CM | POA: Diagnosis present

## 2023-08-08 DIAGNOSIS — Z7901 Long term (current) use of anticoagulants: Secondary | ICD-10-CM | POA: Insufficient documentation

## 2023-08-08 LAB — CBC WITH DIFFERENTIAL/PLATELET
Abs Immature Granulocytes: 0.01 10*3/uL (ref 0.00–0.07)
Basophils Absolute: 0.1 10*3/uL (ref 0.0–0.1)
Basophils Relative: 1 %
Eosinophils Absolute: 0.2 10*3/uL (ref 0.0–0.5)
Eosinophils Relative: 4 %
HCT: 33 % — ABNORMAL LOW (ref 36.0–46.0)
Hemoglobin: 10.3 g/dL — ABNORMAL LOW (ref 12.0–15.0)
Immature Granulocytes: 0 %
Lymphocytes Relative: 26 %
Lymphs Abs: 1.5 10*3/uL (ref 0.7–4.0)
MCH: 28.6 pg (ref 26.0–34.0)
MCHC: 31.2 g/dL (ref 30.0–36.0)
MCV: 91.7 fL (ref 80.0–100.0)
Monocytes Absolute: 0.6 10*3/uL (ref 0.1–1.0)
Monocytes Relative: 10 %
Neutro Abs: 3.6 10*3/uL (ref 1.7–7.7)
Neutrophils Relative %: 59 %
Platelets: 326 10*3/uL (ref 150–400)
RBC: 3.6 MIL/uL — ABNORMAL LOW (ref 3.87–5.11)
RDW: 13.5 % (ref 11.5–15.5)
WBC: 6 10*3/uL (ref 4.0–10.5)
nRBC: 0 % (ref 0.0–0.2)

## 2023-08-08 LAB — COMPREHENSIVE METABOLIC PANEL
ALT: 12 U/L (ref 0–44)
AST: 20 U/L (ref 15–41)
Albumin: 3.4 g/dL — ABNORMAL LOW (ref 3.5–5.0)
Alkaline Phosphatase: 120 U/L (ref 38–126)
Anion gap: 11 (ref 5–15)
BUN: 29 mg/dL — ABNORMAL HIGH (ref 8–23)
CO2: 25 mmol/L (ref 22–32)
Calcium: 8.9 mg/dL (ref 8.9–10.3)
Chloride: 101 mmol/L (ref 98–111)
Creatinine, Ser: 2.39 mg/dL — ABNORMAL HIGH (ref 0.44–1.00)
GFR, Estimated: 21 mL/min — ABNORMAL LOW (ref >60)
Glucose, Bld: 86 mg/dL (ref 70–99)
Potassium: 3.8 mmol/L (ref 3.5–5.1)
Sodium: 137 mmol/L (ref 135–145)
Total Bilirubin: 0.5 mg/dL (ref 0.0–1.2)
Total Protein: 6.7 g/dL (ref 6.5–8.1)

## 2023-08-08 MED ORDER — DOXYCYCLINE HYCLATE 100 MG PO CAPS: 100.0000 mg | ORAL_CAPSULE | Freq: Two times a day (BID) | ORAL | 0 refills | Status: DC

## 2023-08-08 NOTE — ED Triage Notes (Signed)
 Pt BIB ems for bilateral leg swelling (cellulitis). Pt has been on antibiotics for cellulitis but has not improved. Pt states last dose was given yesterday.

## 2023-08-08 NOTE — ED Provider Notes (Signed)
 [name] Provider Note   CSN: [id number] Arrival date & time: 08/08/23  1155     History  Chief Complaint  Patient presents with   Leg Swelling    [name] is a 78 y.o. female.  Patient with significant lymphedema edema.  She has developed cellulitis to her legs before and she is sent here for evaluation for possible new cellulitis again.  Her family doctor sent her here  The history is provided by the patient and medical records. No language interpreter was used.  Leg Pain Location:  Leg Injury: no   Leg location:  L lower leg and R lower leg Pain details:    Quality:  Cramping   Radiates to:  Does not radiate   Severity:  Mild   Onset quality:  Gradual   Timing:  Constant Chronicity:  Recurrent Dislocation: no   Associated symptoms: no back pain and no fatigue        Home Medications Prior to Admission medications   Medication Sig Start Date End Date Taking? Authorizing Provider  doxycycline (VIBRAMYCIN) 100 MG capsule Take 1 capsule (100 mg total) by mouth 2 (two) times daily. One po bid x 7 days 08/08/23  Yes Bethann Berkshire, MD  acetaminophen (TYLENOL) 325 MG tablet Take 2 tablets (650 mg total) by mouth every 6 (six) hours as needed for mild pain (or Fever >/= 101). 10/01/22   Shon Hale, MD  apixaban (ELIQUIS) 5 MG TABS tablet Take 5 mg by mouth 2 (two) times daily.    [provider]  clarithromycin (BIAXIN) 500 MG tablet Take 500 mg by mouth 2 (two) times daily. 06/03/23   [provider]  ferrous sulfate 325 (65 FE) MG tablet Take 1 tablet (325 mg total) by mouth daily with breakfast. 10/01/22   Shon Hale, MD  furosemide (LASIX) 40 MG tablet Take 1 tablet (40 mg total) by mouth daily. 05/09/23   Antoine Poche, MD  glucose 4 GM chewable tablet Chew 1 tablet (4 g total) by mouth as needed for low blood sugar. 10/01/22   Shon Hale, MD  levothyroxine (SYNTHROID) 75 MCG tablet Take  1 tablet (75 mcg total) by mouth daily. 05/07/22   Daphine Deutscher, Mary-Margaret, FNP  magnesium oxide (MAG-OX) 400 (240 Mg) MG tablet Take 400 mg by mouth daily.    [provider]  mirtazapine (REMERON) 7.5 MG tablet Take 1 tablet (7.5 mg total) by mouth at bedtime. For appetite stimulation 10/28/22   Shon Hale, MD  Multiple Vitamin (MULTIVITAMIN) tablet Take 1 tablet by mouth daily.    [provider]  oxyCODONE (OXY IR/ROXICODONE) 5 MG immediate release tablet Take 1 tablet (5 mg total) by mouth daily as needed for severe pain. 01/01/23   Sherryll Burger, Pratik D, DO  rOPINIRole (REQUIP XL) 2 MG 24 hr tablet Take 1 tablet (2 mg total) by mouth at bedtime. 01/01/23   Sherryll Burger, Pratik D, DO  sodium bicarbonate 650 MG tablet Take 1 tablet (650 mg total) by mouth 3 (three) times daily. 10/01/22 10/01/23  Shon Hale, MD      Allergies    Celebrex [celecoxib], Keflex [cephalexin], Penicillins, Sulfa antibiotics, Kenalog [triamcinolone acetonide], Latex, Mobic [meloxicam], and Zestril [lisinopril]    Review of Systems   Review of Systems  Constitutional:  Negative for appetite change and fatigue.  HENT:  Negative for congestion, ear discharge and sinus pressure.   Eyes:  Negative for discharge.  Respiratory:  Negative for  cough.   Cardiovascular:  Negative for chest pain.  Gastrointestinal:  Negative for abdominal pain and diarrhea.  Genitourinary:  Negative for frequency and hematuria.  Musculoskeletal:  Negative for back pain.       Severe lymphedema to both legs  Skin:  Positive for rash.  Neurological:  Negative for seizures and headaches.  Psychiatric/Behavioral:  Negative for hallucinations.     Physical Exam Updated Vital Signs BP 110/60   Pulse 61   Temp 97.9 F (36.6 C) (Oral)   Resp 12   Ht 5\' 4"  (1.626 m)   Wt 99.9 kg   SpO2 98%   BMI 37.80 kg/m  Physical Exam Vitals and nursing note reviewed.  Constitutional:      Appearance: She is well-developed.  HENT:      Head: Normocephalic.     Nose: Nose normal.  Eyes:     General: No scleral icterus.    Conjunctiva/sclera: Conjunctivae normal.  Neck:     Thyroid: No thyromegaly.  Cardiovascular:     Rate and Rhythm: Normal rate and regular rhythm.     Heart sounds: No murmur heard.    No friction rub. No gallop.  Pulmonary:     Breath sounds: No stridor. No wheezing or rales.  Chest:     Chest wall: No tenderness.  Abdominal:     General: There is no distension.     Tenderness: There is no abdominal tenderness. There is no rebound.  Musculoskeletal:        General: Normal range of motion.     Cervical back: Neck supple.     Comments: Severe lymphedema to both legs  Lymphadenopathy:     Cervical: No cervical adenopathy.  Skin:    Findings: Rash present. No erythema.  Neurological:     Mental Status: She is alert and oriented to person, place, and time.     Motor: No abnormal muscle tone.     Coordination: Coordination normal.  Psychiatric:        Behavior: Behavior normal.     ED Results / Procedures / Treatments   Labs (all labs ordered are listed, but only abnormal results are displayed) Labs Reviewed  CBC WITH DIFFERENTIAL/PLATELET - Abnormal; Notable for the following components:      Result Value   RBC 3.60 (*)    Hemoglobin 10.3 (*)    HCT 33.0 (*)    All other components within normal limits  COMPREHENSIVE METABOLIC PANEL - Abnormal; Notable for the following components:   BUN 29 (*)    Creatinine, Ser 2.39 (*)    Albumin 3.4 (*)    GFR, Estimated 21 (*)    All other components within normal limits    EKG None  Radiology No results found.  Procedures Procedures    Medications Ordered in ED Medications - No data to display  ED Course/ Medical Decision Making/ A&P                                 Medical Decision Making Amount and/or Complexity of Data Reviewed Labs: ordered.  Risk Prescription drug management.   Labs unremarkable.  Suspect just  worsening lymphedema.  She will increase her Lasix to 80 mg a day for the next few days and she will be empirically placed on doxycycline and follow-up with PCP.  Although, I think it is very unlikely she has cellulitis starting  Final Clinical Impression(s) / ED Diagnoses Final diagnoses:  Leg swelling    Rx / DC Orders ED Discharge Orders          Ordered    doxycycline (VIBRAMYCIN) 100 MG capsule  2 times daily        08/08/23 1525              Bethann Berkshire, MD 08/09/23 [phone]

## 2023-08-08 NOTE — Discharge Instructions (Signed)
 Increase your fluid pill called Lasix so you are taking 2 pills a day for the next 3 days.  Follow-up with your family doctor next week

## 2023-08-22 ENCOUNTER — Ambulatory Visit: Payer: Self-pay

## 2023-08-22 ENCOUNTER — Emergency Department (HOSPITAL_COMMUNITY)
Admission: EM | Admit: 2023-08-22 | Discharge: 2023-08-23 | Disposition: A | Discharge status: Home / Self Care | Attending: Emergency Medicine | Admitting: Emergency Medicine

## 2023-08-22 ENCOUNTER — Emergency Department (HOSPITAL_COMMUNITY)

## 2023-08-22 ENCOUNTER — Other Ambulatory Visit: Payer: Self-pay

## 2023-08-22 ENCOUNTER — Encounter (HOSPITAL_COMMUNITY): Payer: Self-pay

## 2023-08-22 DIAGNOSIS — Z91040 Latex allergy status: Secondary | ICD-10-CM | POA: Insufficient documentation

## 2023-08-22 DIAGNOSIS — S81802A Unspecified open wound, left lower leg, initial encounter: Secondary | ICD-10-CM | POA: Diagnosis not present

## 2023-08-22 DIAGNOSIS — Z7901 Long term (current) use of anticoagulants: Secondary | ICD-10-CM | POA: Diagnosis not present

## 2023-08-22 DIAGNOSIS — I890 Lymphedema, not elsewhere classified: Secondary | ICD-10-CM | POA: Insufficient documentation

## 2023-08-22 DIAGNOSIS — M79661 Pain in right lower leg: Secondary | ICD-10-CM | POA: Diagnosis present

## 2023-08-22 LAB — CBC WITH DIFFERENTIAL/PLATELET
Abs Immature Granulocytes: 0.02 10*3/uL (ref 0.00–0.07)
Basophils Absolute: 0 10*3/uL (ref 0.0–0.1)
Basophils Relative: 0 %
Eosinophils Absolute: 0.1 10*3/uL (ref 0.0–0.5)
Eosinophils Relative: 1 %
HCT: 37.7 % (ref 36.0–46.0)
Hemoglobin: 12.3 g/dL (ref 12.0–15.0)
Immature Granulocytes: 0 %
Lymphocytes Relative: 17 %
Lymphs Abs: 1.2 10*3/uL (ref 0.7–4.0)
MCH: 29.6 pg (ref 26.0–34.0)
MCHC: 32.6 g/dL (ref 30.0–36.0)
MCV: 90.8 fL (ref 80.0–100.0)
Monocytes Absolute: 0.6 10*3/uL (ref 0.1–1.0)
Monocytes Relative: 9 %
Neutro Abs: 5.1 10*3/uL (ref 1.7–7.7)
Neutrophils Relative %: 73 %
Platelets: 340 10*3/uL (ref 150–400)
RBC: 4.15 MIL/uL (ref 3.87–5.11)
RDW: 13.2 % (ref 11.5–15.5)
WBC: 7 10*3/uL (ref 4.0–10.5)
nRBC: 0 % (ref 0.0–0.2)

## 2023-08-22 LAB — LACTIC ACID, PLASMA
Lactic Acid, Venous: 1.1 mmol/L (ref 0.5–1.9)
Lactic Acid, Venous: 0.9 mmol/L (ref 0.5–1.9)

## 2023-08-22 LAB — BASIC METABOLIC PANEL WITH GFR
Anion gap: 13 (ref 5–15)
BUN: 57 mg/dL — ABNORMAL HIGH (ref 8–23)
CO2: 28 mmol/L (ref 22–32)
Calcium: 9.2 mg/dL (ref 8.9–10.3)
Chloride: 96 mmol/L — ABNORMAL LOW (ref 98–111)
Creatinine, Ser: 2.98 mg/dL — ABNORMAL HIGH (ref 0.44–1.00)
GFR, Estimated: 16 mL/min — ABNORMAL LOW (ref >60)
Glucose, Bld: 107 mg/dL — ABNORMAL HIGH (ref 70–99)
Potassium: 4.4 mmol/L (ref 3.5–5.1)
Sodium: 137 mmol/L (ref 135–145)

## 2023-08-22 LAB — MAGNESIUM: Magnesium: 2.4 mg/dL (ref 1.7–2.4)

## 2023-08-22 MED ORDER — FENTANYL CITRATE PF 50 MCG/ML IJ SOSY
50.0000 ug | PREFILLED_SYRINGE | Freq: Once | INTRAMUSCULAR | Status: AC
  Administered 2023-08-22: 50 ug via INTRAVENOUS
  Filled 2023-08-22: qty 1

## 2023-08-22 NOTE — Telephone Encounter (Signed)
 Copied from CRM [id number]. Topic: Clinical - Red Word Triage >> Aug 22, 2023  9:42 AM Marland Kitchen D wrote: Swollen legs with blisters that leaked last night  Chief Complaint: bilateral legs swelling Symptoms: see above Frequency: constant Pertinent Negatives: Patient denies cp, sob Disposition: [] ED /[x] Urgent Care (no appt availability in office) / [] Appointment(In office/virtual)/ []  [facility]/ [] Home Care/ [] Refused Recommended Disposition /[] [facility]/ []  Follow-up with PCP Additional Notes: per protocol instructed to go to the UC; care advice given, denies questions; instructed to go to ER if becomes worse.   Reason for Disposition  [1] Red area or streak [2] large (> 2 in. or 5 cm)  Answer Assessment - Initial Assessment Questions 1. ONSET: "When did the swelling start?" (e.g., minutes, hours, days)     Left leg swollen more with open blister and right leg swollen 2. LOCATION: "What part of the leg is swollen?"  "Are both legs swollen or just one leg?"     Left worse 3. SEVERITY: "How bad is the swelling?" (e.g., localized; mild, moderate, severe)   - Localized: Small area of swelling localized to one leg.   - MILD pedal edema: Swelling limited to foot and ankle, pitting edema < 1/4 inch (6 mm) deep, rest and elevation eliminate most or all swelling.   - MODERATE edema: Swelling of lower leg to knee, pitting edema > 1/4 inch (6 mm) deep, rest and elevation only partially reduce swelling.   - SEVERE edema: Swelling extends above knee, facial or hand swelling present.      moderate 4. REDNESS: "Does the swelling look red or infected?"     red 5. PAIN: "Is the swelling painful to touch?" If Yes, ask: "How painful is it?"   (Scale 1-10; mild, moderate or severe)     denies 6. FEVER: "Do you have a fever?" If Yes, ask: "What is it, how was it measured, and when did it start?"      denies 7. CAUSE: "What do you think is causing the leg swelling?"     Yes, had  swelling before 8. MEDICAL HISTORY: "Do you have a history of blood clots (e.g., DVT), cancer, heart failure, kidney disease, or liver failure?"     denies 9. RECURRENT SYMPTOM: "Have you had leg swelling before?" If Yes, ask: "When was the last time?" "What happened that time?"     yes 10. OTHER SYMPTOMS: "Do you have any other symptoms?" (e.g., chest pain, difficulty breathing)       denies 11. PREGNANCY: "Is there any chance you are pregnant?" "When was your last menstrual period?"       na  Protocols used: Leg Swelling and Edema-A-AH

## 2023-08-22 NOTE — Consult Note (Signed)
 Patient Demographics  [name], is a 78 y.o. female   MRN: [id number]   DOB - 09/05/1945  Admit Date - 08/22/2023    Outpatient Primary MD for the patient is Sunday Spillers, NP  Consult requested in the Hospital by Bethann Berkshire, MD, On 08/22/2023    Reason for consult : evaluate for admission   With History of -  Past Medical History:  Diagnosis Date   Allergy    Bradycardia    Bronchitis, chronic (HCC)    Chronic anxiety    Complication of anesthesia    hard to wake up   COPD (chronic obstructive pulmonary disease) (HCC)    Depression    Esophageal reflux    Headache(784.0)    Hyperlipidemia    Hypertension    Hypothyroidism    Obesity    Osteoporosis    Overactive bladder    PVC's (premature ventricular contractions)    Thyroid disease    Vitamin D deficiency disease       Past Surgical History:  Procedure Laterality Date   ABDOMINAL HYSTERECTOMY     CENTRAL LINE INSERTION Right 01/10/2022   Procedure: CENTRAL LINE INSERTION;  Surgeon: Lucretia Roers, MD;  Location: [facility];  Service: General;  Laterality: Right;   CHOLECYSTECTOMY N/A 11/04/2013   Procedure: LAPAROSCOPIC CHOLECYSTECTOMY WITH INTRAOPERATIVE CHOLANGIOGRAM;  Surgeon: Wilmon Arms. Corliss Skains, MD;  Location: [facility];  Service: General;  Laterality: N/A;   COLONOSCOPY     FRACTURE SURGERY     pins removed from Hip surgery   HIP FRACTURE SURGERY  1990    pins removed in 1991   IR FLUORO GUIDE CV LINE LEFT  01/31/2022   IR IVC FILTER PLMT / S&I /IMG GUID/MOD SED  10/25/2022   IR US GUIDE VASC ACCESS LEFT  01/31/2022   LAPAROTOMY N/A 01/10/2022   Procedure: EXPLORATORY LAPAROTOMY,;  Surgeon: Lucretia Roers, MD;  Location: [facility];  Service: General;  Laterality: N/A;   LYSIS OF ADHESION N/A 01/10/2022   Procedure: LYSIS OF ADHESION;  Surgeon: Lucretia Roers, MD;  Location: [facility];  Service: General;  Laterality:  N/A;   PARTIAL HYSTERECTOMY  1987   SPINAL FUSION     THORACENTESIS N/A 01/18/2022   Procedure: THORACENTESIS;  Surgeon: Lorin Glass, MD;  Location: [facility];  Service: Pulmonary;  Laterality: N/A;   UPPER GASTROINTESTINAL ENDOSCOPY      in for   Chief Complaint  Patient presents with   Leg Pain    Bilateral leg pain and swelling; bilateral erythema lower legs; stage 2 wound noted posterior left calf.     HPI  [name]  is a 78 y.o. female, medical history for chronic lymphedema, history of DVT, on anticoagulation and has IVC filter, morbid obesity, ALF resident, and ED visit secondary to wound, she is on doxycycline, history of C. difficile in the past due to multiple antibiotics treatment, resents to ED today secondary to complaint of cramps, and new blister in  the back of her leg which turned into wound, send complains of pain, denies any fever, chills, has discussed with the patient she could not be sent to her PCP for evaluation as it is later on the day on Friday, so she was sent to the ED for further evaluation, reports she did follow with lymphedema clinic in the past, did not follow-up for couple years now, as well she did not try Unna boots as well, her workup in ED showing creatinine of 2.98, from baseline of 2.4, by reviewing her facility records it does appear she was started on Aldactone recently.    Review of Systems      A full 10 point Review of Systems was done, except as stated above, all other Review of Systems were negative.   Social History Social History   Tobacco Use   Smoking status: Never   Smokeless tobacco: Never  Substance Use Topics   Alcohol use: No     Family History Family History  Problem Relation Age of Onset   Arthritis Other    Heart disease Other    Cancer Other    Diabetes Other    OCD Other      Prior to Admission medications   Medication Sig Start Date End Date Taking? Authorizing Provider  spironolactone  (ALDACTONE) 25 MG tablet Take 25 mg by mouth daily. 08/05/23  Yes [provider]  ammonium lactate (AMLACTIN) 12 % cream Apply 1 Application topically as needed for dry skin. 08/09/23   [provider]  apixaban (ELIQUIS) 5 MG TABS tablet Take 5 mg by mouth 2 (two) times daily.    [provider]  clarithromycin (BIAXIN) 500 MG tablet Take 500 mg by mouth 2 (two) times daily. 06/03/23   [provider]  doxycycline (VIBRAMYCIN) 100 MG capsule Take 1 capsule (100 mg total) by mouth 2 (two) times daily. One po bid x 7 days 08/08/23   Bethann Berkshire, MD  ferrous sulfate 325 (65 FE) MG tablet Take 1 tablet (325 mg total) by mouth daily with breakfast. 10/01/22   Shon Hale, MD  furosemide (LASIX) 40 MG tablet Take 1 tablet (40 mg total) by mouth daily. 05/09/23   Antoine Poche, MD  glucose 4 GM chewable tablet Chew 1 tablet (4 g total) by mouth as needed for low blood sugar. 10/01/22   Shon Hale, MD  GOODSENSE PAIN RELIEF EXTRA ST 500 MG tablet Take 500 mg by mouth every 8 (eight) hours as needed for mild pain (pain score 1-3). 07/01/23   [provider]  levothyroxine (SYNTHROID) 75 MCG tablet Take 1 tablet (75 mcg total) by mouth daily. 05/07/22   Daphine Deutscher, Mary-Margaret, FNP  lidocaine (LIDODERM) 5 % Place 1 patch onto the skin daily as needed (pain). 12 hours on and 12 hours off 07/31/23 08/30/23  [provider]  magnesium oxide (MAG-OX) 400 (240 Mg) MG tablet Take 400 mg by mouth daily.    [provider]  methocarbamol (ROBAXIN) 500 MG tablet Take 1,000 mg by mouth 3 (three) times daily as needed for muscle spasms. 07/31/23   [provider]  mirtazapine (REMERON) 7.5 MG tablet Take 1 tablet (7.5 mg total) by mouth at bedtime. For appetite stimulation 10/28/22   Shon Hale, MD  Multiple Vitamin (MULTIVITAMIN) tablet Take 1 tablet by mouth daily.    [provider]  oxyCODONE (OXY IR/ROXICODONE) 5 MG immediate  release tablet Take 1 tablet (5 mg total) by mouth daily as  needed for severe pain. 01/01/23   Sherryll Burger, Pratik D, DO  rOPINIRole (REQUIP XL) 2 MG 24 hr tablet Take 1 tablet (2 mg total) by mouth at bedtime. 01/01/23   Sherryll Burger, Pratik D, DO  sodium bicarbonate 650 MG tablet Take 1 tablet (650 mg total) by mouth 3 (three) times daily. 10/01/22 10/01/23  Shon Hale, MD    Anti-infectives (From admission, onward)    None       Scheduled Meds: Continuous Infusions: PRN Meds:.  Allergies  Allergen Reactions   Celebrex [Celecoxib] Swelling   Keflex [Cephalexin] Swelling   Penicillins Hives and Swelling   Sulfa Antibiotics Swelling   Kenalog [Triamcinolone Acetonide] Other (See Comments)    Unknown reaction   Latex Other (See Comments)    Unknown reaction   Mobic [Meloxicam] Swelling   Zestril [Lisinopril] Other (See Comments)    Unknown reaction    Physical Exam  Vitals  Blood pressure (!) 165/81, pulse 67, temperature 98 F (36.7 C), temperature source Oral, resp. rate 16, height 5\' 4"  (1.626 m), weight 90.7 kg, SpO2 96%.   1. General Well-developed obese female, laying in bed, no apparent distress  2. Normal affect and insight, Not Suicidal or Homicidal, Awake Alert, Oriented X 3.  3. No F.N deficits, ALL C.Nerves Intact, Strength 5/5 all 4 extremities, Sensation intact all 4 extremities, Plantars down going.  4. Ears and Eyes appear Normal, Conjunctivae clear, PERRLA. Moist Oral Mucosa.  5. Supple Neck, No JVD, No cervical lymphadenopathy appriciated, No Carotid Bruits.  6. Symmetrical Chest wall movement, Good air movement bilaterally, CTAB.  7. RRR, No Gallops, Rubs or Murmurs, No Parasternal Heave.  8. Positive Bowel Sounds, Abdomen Soft, No tenderness, No organomegaly appriciated,No rebound -guarding or rigidity.  9.  No Cyanosis, Normal Skin Turgor,   10.  patient with significant lower extremity lymphedema, with a chronic skin changes, and small ulceration in the  posterior left lower extremity, no surrounding cellulitis or discharge  Data Review  CBC Recent Labs  Lab 08/22/23 1414  WBC 7.0  HGB 12.3  HCT 37.7  PLT 340  MCV 90.8  MCH 29.6  MCHC 32.6  RDW 13.2  LYMPHSABS 1.2  MONOABS 0.6  EOSABS 0.1  BASOSABS 0.0   ------------------------------------------------------------------------------------------------------------------  Chemistries  Recent Labs  Lab 08/22/23 1414  NA 137  K 4.4  CL 96*  CO2 28  GLUCOSE 107*  BUN 57*  CREATININE 2.98*  CALCIUM 9.2  MG 2.4   ------------------------------------------------------------------------------------------------------------------ estimated creatinine clearance is 17.5 mL/min (A) (by C-G formula based on SCr of 2.98 mg/dL (H)). ------------------------------------------------------------------------------------------------------------------ No results for input(s): "TSH", "T4TOTAL", "T3FREE", "THYROIDAB" in the last 72 hours.  Invalid input(s): "FREET3"   Coagulation profile No results for input(s): "INR", "PROTIME" in the last 168 hours. ------------------------------------------------------------------------------------------------------------------- No results for input(s): "DDIMER" in the last 72 hours. -------------------------------------------------------------------------------------------------------------------  Cardiac Enzymes No results for input(s): "CKMB", "TROPONINI", "MYOGLOBIN" in the last 168 hours.  Invalid input(s): "CK" ------------------------------------------------------------------------------------------------------------------ Invalid input(s): "POCBNP"   ---------------------------------------------------------------------------------------------------------------  Urinalysis    Component Value Date/Time   COLORURINE STRAW (A) 11/12/2022 1607   APPEARANCEUR CLEAR 11/12/2022 1607   APPEARANCEUR Clear 03/01/2021 1257   LABSPEC 1.008  11/12/2022 1607   PHURINE 5.0 11/12/2022 1607   GLUCOSEU NEGATIVE 11/12/2022 1607   HGBUR NEGATIVE 11/12/2022 1607   BILIRUBINUR NEGATIVE 11/12/2022 1607   BILIRUBINUR Negative 03/01/2021 1257   KETONESUR NEGATIVE 11/12/2022 1607   PROTEINUR NEGATIVE 11/12/2022 1607   NITRITE NEGATIVE 11/12/2022 1607   LEUKOCYTESUR NEGATIVE  11/12/2022 1607     Imaging results:   US Venous Img Lower Bilateral Result Date: 08/22/2023 CLINICAL DATA:  Bilateral lower extremity pain and swelling. EXAM: BILATERAL LOWER EXTREMITY VENOUS DOPPLER ULTRASOUND TECHNIQUE: Gray-scale sonography with graded compression, as well as color Doppler and duplex ultrasound were performed to evaluate the lower extremity deep venous systems from the level of the common femoral vein and including the common femoral, femoral, profunda femoral, popliteal and calf veins including the posterior tibial, peroneal and gastrocnemius veins when visible. The superficial great saphenous vein was also interrogated. Spectral Doppler was utilized to evaluate flow at rest and with distal augmentation maneuvers in the common femoral, femoral and popliteal veins. COMPARISON:  Oct 23, 2022. FINDINGS: RIGHT LOWER EXTREMITY Common Femoral Vein: No evidence of thrombus. Normal compressibility, respiratory phasicity and response to augmentation. Saphenofemoral Junction: No evidence of thrombus. Normal compressibility and flow on color Doppler imaging. Profunda Femoral Vein: No evidence of thrombus. Normal compressibility and flow on color Doppler imaging. Femoral Vein: No evidence of thrombus. Normal compressibility, respiratory phasicity and response to augmentation. Popliteal Vein: No evidence of thrombus. Normal compressibility, respiratory phasicity and response to augmentation. Calf Veins: No evidence of thrombus. Normal compressibility and flow on color Doppler imaging. Superficial Great Saphenous Vein: No evidence of thrombus. Normal compressibility.  Venous Reflux:  None. Other Findings:  None. LEFT LOWER EXTREMITY Common Femoral Vein: No evidence of thrombus. Normal compressibility, respiratory phasicity and response to augmentation. Saphenofemoral Junction: No evidence of thrombus. Normal compressibility and flow on color Doppler imaging. Profunda Femoral Vein: No evidence of thrombus. Normal compressibility and flow on color Doppler imaging. Femoral Vein: No evidence of thrombus. Normal compressibility, respiratory phasicity and response to augmentation. Popliteal Vein: Not scanned due to overlying open wound. Calf Veins: Not scanned due to overlying open wound. Superficial Great Saphenous Vein: No evidence of thrombus. Normal compressibility. Venous Reflux:  None. Other Findings:  None. IMPRESSION: No definite evidence of deep venous thrombosis seen in right lower extremity. No definite evidence of deep venous thrombosis seen in left common femoral, profunda femoral and superficial femoral veins. The left popliteal, posterior tibial and peroneal veins were not visualized due to overlying open wound. Electronically Signed   By: Lupita Raider M.D.   On: 08/22/2023 15:50       Assessment & Plan  Active Problems:   Wound of left leg   Lymphedema   AKI on CKD stage IV -He does appear to be mildly volume depleted, this is most likely in the setting of aggressive diuresis and attempt to improve her lymphedema, does appear she was recently started on Aldactone at her facility, so recommendation is to discontinue Aldactone, and hold Lasix for next 2 days, and recheck BMP in 3 days  Lower extremity lymphedema -She is having history of significant massive lower extremity lymphedema, this will not improve with aggressive overdiuresis as she does appear to be volume depleted at this point. -She will need follow-up with lymphedema clinic, regarding Unna boots and other alternatives to manage the lymphedema locally, ambulatory referral has been sent to  [facility] rehab outpatient center for lymphedema management.  Lower extremity wound -does have left lower extremity wound from blister, does not appear to be infected, no indication for antibiotics at this point, continue with local wound care, to apply Xeroform patch with ABD pad with Ace wrap.      Recommendation at time of discharge: -Discontinue Aldactone at time of discharge, and hold Lasix for next 2  days, to resume on 08/25/2023 -CBC, BMP in 3 days. -Continue dressing changes daily, apply Xeroform patch, covered by ABD pad, with Ace wrap.  Health have ordered, please ensure case manager from facility following that to arrange for home health regarding nurse for wound dressing change. -Will need to follow-up with lymphedema clinic, ambulatory referral has sent to [facility] outpatient rehab center for lymphedema management, she will benefit Unna boots when her wound heals, please ensure timely follow-up and arrange for transport.         Family Communication: I have discussed with her son by phone   Thank you for the consult, we will follow the patient with you in the Hospital.   Huey Bienenstock M.D on 08/22/2023 at 5:58 PM     Thank you for the consult, we will follow the patient with you in the Hospital.   [facility]   Office  [phone]

## 2023-08-22 NOTE — ED Provider Notes (Signed)
 [place] EMERGENCY DEPARTMENT AT [facility] Provider Note   CSN: [id number] Arrival date & time: 08/22/23  1324     History  Chief Complaint  Patient presents with   Leg Pain    Bilateral leg pain and swelling; bilateral erythema lower legs; stage 2 wound noted posterior left calf.    [name] is a 78 y.o. female.  She has a history of DVT, is on anticoagulation and has an IVC filter.  She has chronic lymphedema of her lower extremities.  She was here 2 weeks ago for same, increased diuretic and put on a course of doxycycline.  She said her legs continue to swell and cramp along with the new blister on the back of her left leg.  Been draining clear fluid.  She said the nursing staff at [facility] sent her here for further evaluation.  Denies any fever.  The history is provided by the patient.  Leg Pain Location:  Leg Time since incident:  1 month Injury: no   Leg location:  L lower leg and R lower leg Pain details:    Quality:  Cramping   Severity:  Moderate   Onset quality:  Gradual   Timing:  Intermittent   Progression:  Unchanged Chronicity:  Recurrent Relieved by:  Nothing Associated symptoms: swelling   Associated symptoms: no fever        Home Medications Prior to Admission medications   Medication Sig Start Date End Date Taking? Authorizing Provider  acetaminophen (TYLENOL) 325 MG tablet Take 2 tablets (650 mg total) by mouth every 6 (six) hours as needed for mild pain (or Fever >/= 101). 10/01/22   Shon Hale, MD  apixaban (ELIQUIS) 5 MG TABS tablet Take 5 mg by mouth 2 (two) times daily.    [provider]  clarithromycin (BIAXIN) 500 MG tablet Take 500 mg by mouth 2 (two) times daily. 06/03/23   [provider]  doxycycline (VIBRAMYCIN) 100 MG capsule Take 1 capsule (100 mg total) by mouth 2 (two) times daily. One po bid x 7 days 08/08/23   Bethann Berkshire, MD  ferrous sulfate 325 (65 FE) MG tablet Take 1 tablet (325 mg  total) by mouth daily with breakfast. 10/01/22   Shon Hale, MD  furosemide (LASIX) 40 MG tablet Take 1 tablet (40 mg total) by mouth daily. 05/09/23   Antoine Poche, MD  glucose 4 GM chewable tablet Chew 1 tablet (4 g total) by mouth as needed for low blood sugar. 10/01/22   Shon Hale, MD  levothyroxine (SYNTHROID) 75 MCG tablet Take 1 tablet (75 mcg total) by mouth daily. 05/07/22   Daphine Deutscher, Mary-Margaret, FNP  magnesium oxide (MAG-OX) 400 (240 Mg) MG tablet Take 400 mg by mouth daily.    [provider]  mirtazapine (REMERON) 7.5 MG tablet Take 1 tablet (7.5 mg total) by mouth at bedtime. For appetite stimulation 10/28/22   Shon Hale, MD  Multiple Vitamin (MULTIVITAMIN) tablet Take 1 tablet by mouth daily.    [provider]  oxyCODONE (OXY IR/ROXICODONE) 5 MG immediate release tablet Take 1 tablet (5 mg total) by mouth daily as needed for severe pain. 01/01/23   Sherryll Burger, Pratik D, DO  rOPINIRole (REQUIP XL) 2 MG 24 hr tablet Take 1 tablet (2 mg total) by mouth at bedtime. 01/01/23   Sherryll Burger, Pratik D, DO  sodium bicarbonate 650 MG tablet Take 1 tablet (650 mg total) by mouth 3 (three) times daily. 10/01/22 10/01/23  Emokpae, Courage,  MD      Allergies    Celebrex [celecoxib], Keflex [cephalexin], Penicillins, Sulfa antibiotics, Kenalog [triamcinolone acetonide], Latex, Mobic [meloxicam], and Zestril [lisinopril]    Review of Systems   Review of Systems  Constitutional:  Negative for fever.    Physical Exam Updated Vital Signs BP 117/66 (BP Location: Right Arm)   Pulse 72   Temp 98 F (36.7 C) (Oral)   Resp 16   SpO2 97%  Physical Exam Vitals and nursing note reviewed.  Constitutional:      General: She is not in acute distress.    Appearance: Normal appearance. She is well-developed.  HENT:     Head: Normocephalic and atraumatic.  Eyes:     Conjunctiva/sclera: Conjunctivae normal.  Cardiovascular:     Rate and Rhythm: Normal rate and regular rhythm.      Heart sounds: No murmur heard. Pulmonary:     Effort: Pulmonary effort is normal. No respiratory distress.     Breath sounds: Normal breath sounds.  Abdominal:     Palpations: Abdomen is soft.     Tenderness: There is no abdominal tenderness.  Musculoskeletal:        General: Tenderness present.     Cervical back: Neck supple.     Right lower leg: Edema present.     Left lower leg: Edema present.     Comments: She has marked lymphedema of bilateral lower extremities.  There is some erythema.  There is a few fluid-filled blisters and ulceration on the back of her left calf.  Skin:    General: Skin is warm and dry.     Capillary Refill: Capillary refill takes less than 2 seconds.  Neurological:     General: No focal deficit present.     Mental Status: She is alert.     Motor: No weakness.     ED Results / Procedures / Treatments   Labs (all labs ordered are listed, but only abnormal results are displayed) Labs Reviewed  BASIC METABOLIC PANEL WITH GFR - Abnormal; Notable for the following components:      Result Value   Chloride 96 (*)    Glucose, Bld 107 (*)    BUN 57 (*)    Creatinine, Ser 2.98 (*)    GFR, Estimated 16 (*)    All other components within normal limits  CULTURE, BLOOD (ROUTINE X 2)  CULTURE, BLOOD (ROUTINE X 2)  LACTIC ACID, PLASMA  LACTIC ACID, PLASMA  CBC WITH DIFFERENTIAL/PLATELET  MAGNESIUM    EKG None  Radiology US Venous Img Lower Bilateral Result Date: 08/22/2023 CLINICAL DATA:  Bilateral lower extremity pain and swelling. EXAM: BILATERAL LOWER EXTREMITY VENOUS DOPPLER ULTRASOUND TECHNIQUE: Gray-scale sonography with graded compression, as well as color Doppler and duplex ultrasound were performed to evaluate the lower extremity deep venous systems from the level of the common femoral vein and including the common femoral, femoral, profunda femoral, popliteal and calf veins including the posterior tibial, peroneal and gastrocnemius veins when  visible. The superficial great saphenous vein was also interrogated. Spectral Doppler was utilized to evaluate flow at rest and with distal augmentation maneuvers in the common femoral, femoral and popliteal veins. COMPARISON:  Oct 23, 2022. FINDINGS: RIGHT LOWER EXTREMITY Common Femoral Vein: No evidence of thrombus. Normal compressibility, respiratory phasicity and response to augmentation. Saphenofemoral Junction: No evidence of thrombus. Normal compressibility and flow on color Doppler imaging. Profunda Femoral Vein: No evidence of thrombus. Normal compressibility and flow on color Doppler imaging. Femoral Vein:  No evidence of thrombus. Normal compressibility, respiratory phasicity and response to augmentation. Popliteal Vein: No evidence of thrombus. Normal compressibility, respiratory phasicity and response to augmentation. Calf Veins: No evidence of thrombus. Normal compressibility and flow on color Doppler imaging. Superficial Great Saphenous Vein: No evidence of thrombus. Normal compressibility. Venous Reflux:  None. Other Findings:  None. LEFT LOWER EXTREMITY Common Femoral Vein: No evidence of thrombus. Normal compressibility, respiratory phasicity and response to augmentation. Saphenofemoral Junction: No evidence of thrombus. Normal compressibility and flow on color Doppler imaging. Profunda Femoral Vein: No evidence of thrombus. Normal compressibility and flow on color Doppler imaging. Femoral Vein: No evidence of thrombus. Normal compressibility, respiratory phasicity and response to augmentation. Popliteal Vein: Not scanned due to overlying open wound. Calf Veins: Not scanned due to overlying open wound. Superficial Great Saphenous Vein: No evidence of thrombus. Normal compressibility. Venous Reflux:  None. Other Findings:  None. IMPRESSION: No definite evidence of deep venous thrombosis seen in right lower extremity. No definite evidence of deep venous thrombosis seen in left common femoral, profunda  femoral and superficial femoral veins. The left popliteal, posterior tibial and peroneal veins were not visualized due to overlying open wound. Electronically Signed   By: Lupita Raider M.D.   On: 08/22/2023 15:50    Procedures Procedures    Medications Ordered in ED Medications  fentaNYL (SUBLIMAZE) injection 50 mcg (50 mcg Intravenous Given 08/22/23 1520)    ED Course/ Medical Decision Making/ A&P                                 Medical Decision Making Amount and/or Complexity of Data Reviewed Labs: ordered.  Risk Prescription drug management.   This patient complains of continued leg swelling leg pain leg cramps and new blister formation left leg; this involves an extensive number of treatment Options and is a complaint that carries with it a high risk of complications and morbidity. The differential includes cellulitis, lymphedema, DVT, thrombosis  I ordered, reviewed and interpreted labs, which included CBC normal chemistries with worsening CKD, lactate normal, blood culture sent I ordered medication IV pain medication and reviewed PMP when indicated. I ordered imaging studies which included duplex bilateral lower extremities and I independently    visualized and interpreted imaging which showed no acute DVT Previous records obtained and reviewed in epic including recent ED notes Cardiac monitoring reviewed, normal sinus rhythm Social determinants considered, no significant barriers Critical Interventions: None  After the interventions stated above, I reevaluated the patient and found patient to be hemodynamically stable in no distress Admission and further testing considered, patient's care signed out to Dr. Estell Harpin to follow-up on results of duplex.  Possible admission for failure of outpatient therapy although I am not really sure that she is far off of her baseline.         Final Clinical Impression(s) / ED Diagnoses Final diagnoses:  Lymphedema    Rx / DC  Orders ED Discharge Orders     None         Terrilee Files, MD 08/22/23 1739

## 2023-08-22 NOTE — ED Triage Notes (Signed)
 Pt bib ems for bilateral leg swelling, erythema noted bi later lower legs; antibiotics prescribed for cellulitis previously; pt. States it was ineffective; last does sometime this week;

## 2023-08-22 NOTE — Discharge Instructions (Signed)
 Discontinue aldactone.  Hold lasix for next 2 days.   Start back lasix on 08/25/23. Get blood tests cbc and bmp in 3 days.  Continue dressing changes daily apply Xerofoam patch covered by ABD pad with Ace wrap.  Home health has been ordered please ensure case manager from facility following that to arrange for home health regarding nurse for wound dressing change will need to follow-up with lymphedema clinic ambulatory referral has been sent to [facility] outpatient rehab center for lymphedema management, she will benefit from from [facility] boots when her wound heals please ensure timely follow-up and arrange for transport

## 2023-08-22 NOTE — Progress Notes (Addendum)
 Recommendation at time of discharge: -Discontinue Aldactone at time of discharge, and hold Lasix for next 2 days, to resume on 08/25/2023 -CBC, BMP in 3 days. -Continue dressing changes daily, apply Xeroform patch, covered by ABD pad, with Ace wrap.  Health have ordered, please ensure case manager from facility following that to arrange for home health regarding nurse for wound dressing change. -Will need to follow-up with lymphedema clinic, ambulatory referral has sent to [facility] outpatient rehab center for lymphedema management, she will benefit [facility] boots when her wound heals, please ensure timely follow-up and arrange for transport.  Full consult to follow. Huey Bienenstock MD

## 2023-08-22 NOTE — ED Provider Notes (Signed)
 Patient was seen by the hospitalist and significant recommendations were given by the hospitalist for the patient to be taken care of as an outpatient   Bethann Berkshire, MD 08/22/23 1802

## 2023-08-27 LAB — CULTURE, BLOOD (ROUTINE X 2)
Culture: NO GROWTH
Culture: NO GROWTH
Special Requests: ADEQUATE
Special Requests: ADEQUATE

## 2023-09-02 ENCOUNTER — Ambulatory Visit: Admitting: Nurse Practitioner

## 2023-09-08 ENCOUNTER — Encounter: Payer: Self-pay | Admitting: *Deleted

## 2023-09-09 ENCOUNTER — Ambulatory Visit: Payer: Medicare Other | Admitting: Cardiology

## 2023-09-09 ENCOUNTER — Ambulatory Visit: Payer: Medicare Other | Attending: Cardiology | Admitting: Cardiology

## 2023-09-09 ENCOUNTER — Encounter: Payer: Self-pay | Admitting: Cardiology

## 2023-09-09 VITALS — BP 160/80 | HR 56 | Ht 64.0 in | Wt 209.2 lb

## 2023-09-09 DIAGNOSIS — I10 Essential (primary) hypertension: Secondary | ICD-10-CM | POA: Insufficient documentation

## 2023-09-09 DIAGNOSIS — I5032 Chronic diastolic (congestive) heart failure: Secondary | ICD-10-CM | POA: Diagnosis present

## 2023-09-09 DIAGNOSIS — Z79899 Other long term (current) drug therapy: Secondary | ICD-10-CM | POA: Diagnosis not present

## 2023-09-09 NOTE — Progress Notes (Signed)
 Clinical Summary [name] is a 78 y.o.female seen today for follow up of the following medical problems.    1. Leg edema/Chronic diastolic HF  -11/2015 LE venous US  [place]: no DVT -09/2017 echo LVEF 60-65%, no WMAs, Diastolic function is abnormal, indeterminant grade   - was seen in lymphedema clinic, started on home pump    - 07/2023 ER evaluation, found to have AKI on CKD IV. Cr was up to 2.98, baseline 2.3 to 2.4 - in ER 200 lbs.  - difficult using her lypmhedema pump due to swelling - on waiting list for lymphedema clinic     2.HTN - she is compalint with meds     4.Afib, isolated episode - new diagnosis 12/2021 during admission with septic shock on pressors, vent. Also SBO that admission - started on amio at that time.  - 30 day monitor without recurrent afib - no recent palpitations.    6. DVT - anticoag complicated by GI bleed - has IVC filter - on eliquis    7. Cdiff colitis admit 09/2022   Past Medical History:  Diagnosis Date   Allergy    Bradycardia    Bronchitis, chronic (HCC)    Chronic anxiety    Complication of anesthesia    hard to wake up   COPD (chronic obstructive pulmonary disease) (HCC)    Depression    Esophageal reflux    Headache(784.0)    Hyperlipidemia    Hypertension    Hypothyroidism    Obesity    Osteoporosis    Overactive bladder    PVC's (premature ventricular contractions)    Thyroid  disease    Vitamin D  deficiency disease      Allergies  Allergen Reactions   Celebrex [Celecoxib] Swelling   Keflex [Cephalexin] Swelling   Penicillins Hives and Swelling   Sulfa Antibiotics Swelling   Kenalog [Triamcinolone  Acetonide] Other (See Comments)    Unknown reaction   Latex Other (See Comments)    Unknown reaction   Mobic [Meloxicam] Swelling   Zestril [Lisinopril] Other (See Comments)    Unknown reaction     Current Outpatient Medications  Medication Sig Dispense Refill   ammonium lactate (AMLACTIN) 12 % cream  Apply 1 Application topically as needed for dry skin.     apixaban  (ELIQUIS ) 5 MG TABS tablet Take 5 mg by mouth 2 (two) times daily.     clarithromycin (BIAXIN) 500 MG tablet Take 500 mg by mouth 2 (two) times daily.     doxycycline  (VIBRAMYCIN ) 100 MG capsule Take 1 capsule (100 mg total) by mouth 2 (two) times daily. One po bid x 7 days 14 capsule 0   ferrous sulfate  325 (65 FE) MG tablet Take 1 tablet (325 mg total) by mouth daily with breakfast. 30 tablet 5   furosemide  (LASIX ) 40 MG tablet Take 1 tablet (40 mg total) by mouth daily. 90 tablet 3   glucose 4 GM chewable tablet Chew 1 tablet (4 g total) by mouth as needed for low blood sugar. 50 tablet 12   GOODSENSE PAIN RELIEF EXTRA ST 500 MG tablet Take 500 mg by mouth every 8 (eight) hours as needed for mild pain (pain score 1-3).     levothyroxine  (SYNTHROID ) 75 MCG tablet Take 1 tablet (75 mcg total) by mouth daily. 90 tablet 1   magnesium  oxide (MAG-OX) 400 (240 Mg) MG tablet Take 400 mg by mouth daily.     methocarbamol (ROBAXIN) 500 MG tablet Take 1,000 mg by  mouth 3 (three) times daily as needed for muscle spasms.     mirtazapine  (REMERON ) 7.5 MG tablet Take 1 tablet (7.5 mg total) by mouth at bedtime. For appetite stimulation 30 tablet 1   Multiple Vitamin (MULTIVITAMIN) tablet Take 1 tablet by mouth daily.     oxyCODONE  (OXY IR/ROXICODONE ) 5 MG immediate release tablet Take 1 tablet (5 mg total) by mouth daily as needed for severe pain. 10 tablet 0   rOPINIRole  (REQUIP  XL) 2 MG 24 hr tablet Take 1 tablet (2 mg total) by mouth at bedtime. 10 tablet 0   sodium bicarbonate  650 MG tablet Take 1 tablet (650 mg total) by mouth 3 (three) times daily. 90 tablet 2   spironolactone (ALDACTONE) 25 MG tablet Take 25 mg by mouth daily.     No current facility-administered medications for this visit.     Past Surgical History:  Procedure Laterality Date   ABDOMINAL HYSTERECTOMY     CENTRAL LINE INSERTION Right 01/10/2022   Procedure:  CENTRAL LINE INSERTION;  Surgeon: Kallie Manuelita BROCKS, MD;  Location: [facility];  Service: General;  Laterality: Right;   CHOLECYSTECTOMY N/A 11/04/2013   Procedure: LAPAROSCOPIC CHOLECYSTECTOMY WITH INTRAOPERATIVE CHOLANGIOGRAM;  Surgeon: Donnice POUR. Belinda, MD;  Location: [facility] OR;  Service: General;  Laterality: N/A;   COLONOSCOPY     FRACTURE SURGERY     pins removed from Hip surgery   HIP FRACTURE SURGERY  1990    pins removed in 1991   IR FLUORO GUIDE CV LINE LEFT  01/31/2022   IR IVC FILTER PLMT / S&I /IMG GUID/MOD SED  10/25/2022   IR US  GUIDE VASC ACCESS LEFT  01/31/2022   LAPAROTOMY N/A 01/10/2022   Procedure: EXPLORATORY LAPAROTOMY,;  Surgeon: Kallie Manuelita BROCKS, MD;  Location: [facility];  Service: General;  Laterality: N/A;   LYSIS OF ADHESION N/A 01/10/2022   Procedure: LYSIS OF ADHESION;  Surgeon: Kallie Manuelita BROCKS, MD;  Location: [facility];  Service: General;  Laterality: N/A;   PARTIAL HYSTERECTOMY  1987   SPINAL FUSION     THORACENTESIS N/A 01/18/2022   Procedure: THORACENTESIS;  Surgeon: Claudene Toribio BROCKS, MD;  Location: [facility] ENDOSCOPY;  Service: Pulmonary;  Laterality: N/A;   UPPER GASTROINTESTINAL ENDOSCOPY       Allergies  Allergen Reactions   Celebrex [Celecoxib] Swelling   Keflex [Cephalexin] Swelling   Penicillins Hives and Swelling   Sulfa Antibiotics Swelling   Kenalog [Triamcinolone  Acetonide] Other (See Comments)    Unknown reaction   Latex Other (See Comments)    Unknown reaction   Mobic [Meloxicam] Swelling   Zestril [Lisinopril] Other (See Comments)    Unknown reaction      Family History  Problem Relation Age of Onset   Arthritis Other    Heart disease Other    Cancer Other    Diabetes Other    OCD Other      Social History [name] reports that she has never smoked. She has never used smokeless tobacco. [name] reports no history of alcohol use.    Physical Examination Today's Vitals   09/09/23 1509  BP: (!) 160/80  Pulse: (!) 56  SpO2: 96%   Weight: 209 lb 3.2 oz (94.9 kg)  Height: 5' 4 (1.626 m)   Body mass index is 35.91 kg/m.  Gen: resting comfortably, no acute distress HEENT: no scleral icterus, pupils equal round and reactive, no palptable cervical adenopathy,  CV:  Resp: Clear to auscultation bilaterally GI: abdomen is soft, non-tender,  non-distended, normal bowel sounds, no hepatosplenomegaly MSK: extremities are warm, no edema.  Skin: warm, no rash Neuro:  no focal deficits Psych: appropriate affect   Diagnostic Studies  09/2017 echo Study Conclusions   - Left ventricle: The cavity size was normal. Wall thickness was   increased in a pattern of mild LVH. Systolic function was normal.   The estimated ejection fraction was in the range of 60% to 65%.   Wall motion was normal; there were no regional wall motion   abnormalities. - Aortic valve: Mildly calcified annulus. Trileaflet; mildly   thickened leaflets. Mean gradient (S): 8 mm Hg. Valve area (VTI):   2.13 cm^2. Valve area (Vmax): 1.86 cm^2. Valve area (Vmean): 2.05   cm^2. - Left atrium: The atrium was severely dilated. - Right atrium: The atrium was mildly dilated. - Technically adequate study.   12/2021 echo IMPRESSIONS     1. Left ventricular ejection fraction, by estimation, is 60 to 65%. The  left ventricle has normal function. The left ventricle has no regional  wall motion abnormalities. Left ventricular diastolic parameters were  normal.   2. Right ventricular systolic function is normal. The right ventricular  size is normal. There is normal pulmonary artery systolic pressure.   3. Left atrial size was mildly dilated.   4. Right atrial size was mildly dilated.   5. Calcified subchordal apparatus. The mitral valve is abnormal. Trivial  mitral valve regurgitation. No evidence of mitral stenosis.   6. The aortic valve is tricuspid. There is mild calcification of the  aortic valve. Aortic valve regurgitation is not visualized. Aortic  valve  sclerosis is present, with no evidence of aortic valve stenosis.   7. The inferior vena cava is normal in size with greater than 50%  respiratory variability, suggesting right atrial pressure of 3 mmHg.          Assessment and Plan  1. Leg edema/Acute on chronic HFpEF/Lymphedema - ongoing edema, will repeat echo to assess for any change in cardiac function - on diuretics recheck bmet/mg   2. HTN - manural recheck was at goal, continue current meds   3.Afib, isolated episode - occurred in settnig of septic shock,respiratory failure - no evidence of recurrence - on anticoag for recent DVT, had not been committed long term for afib - continue to monitor at this time.       Dorn PHEBE Ross, M.D.

## 2023-09-09 NOTE — Patient Instructions (Addendum)
 Medication Instructions:   Stop Spironolactone Continue all other medications.     Labwork:  BMET, Mg - orders given today Please do in 1 week  Office will contact with results via phone, letter or mychart.     Testing/Procedures:  Your physician has requested that you have an echocardiogram. Echocardiography is a painless test that uses sound waves to create images of your heart. It provides your doctor with information about the size and shape of your heart and how well your heart's chambers and valves are working. This procedure takes approximately one hour. There are no restrictions for this procedure. Please do NOT wear cologne, perfume, aftershave, or lotions (deodorant is allowed). Please arrive 15 minutes prior to your appointment time.  Please note: We ask at that you not bring children with you during ultrasound (echo/ vascular) testing. Due to room size and safety concerns, children are not allowed in the ultrasound rooms during exams. Our front office staff cannot provide observation of children in our lobby area while testing is being conducted. An adult accompanying a patient to their appointment will only be allowed in the ultrasound room at the discretion of the ultrasound technician under special circumstances. We apologize for any inconvenience.  Office will contact with results via phone, letter or mychart.     Follow-Up:  6 weeks   Any Other Special Instructions Will Be Listed Below (If Applicable).   If you need a refill on your cardiac medications before your next appointment, please call your pharmacy.

## 2023-09-16 ENCOUNTER — Other Ambulatory Visit (HOSPITAL_COMMUNITY)
Admission: RE | Admit: 2023-09-16 | Discharge: 2023-09-16 | Disposition: A | Discharge status: Home / Self Care | Source: Ambulatory Visit | Attending: Cardiology | Admitting: Cardiology

## 2023-09-16 DIAGNOSIS — Z79899 Other long term (current) drug therapy: Secondary | ICD-10-CM | POA: Insufficient documentation

## 2023-09-16 DIAGNOSIS — I5032 Chronic diastolic (congestive) heart failure: Secondary | ICD-10-CM | POA: Insufficient documentation

## 2023-09-16 DIAGNOSIS — I10 Essential (primary) hypertension: Secondary | ICD-10-CM | POA: Diagnosis present

## 2023-09-16 LAB — BASIC METABOLIC PANEL WITH GFR
Anion gap: 11 (ref 5–15)
BUN: 27 mg/dL — ABNORMAL HIGH (ref 8–23)
CO2: 27 mmol/L (ref 22–32)
Calcium: 8.5 mg/dL — ABNORMAL LOW (ref 8.9–10.3)
Chloride: 97 mmol/L — ABNORMAL LOW (ref 98–111)
Creatinine, Ser: 2.4 mg/dL — ABNORMAL HIGH (ref 0.44–1.00)
GFR, Estimated: 20 mL/min — ABNORMAL LOW (ref >60)
Glucose, Bld: 123 mg/dL — ABNORMAL HIGH (ref 70–99)
Potassium: 3.6 mmol/L (ref 3.5–5.1)
Sodium: 135 mmol/L (ref 135–145)

## 2023-09-16 LAB — MAGNESIUM: Magnesium: 2.2 mg/dL (ref 1.7–2.4)

## 2023-09-17 ENCOUNTER — Telehealth: Payer: Self-pay | Admitting: Cardiology

## 2023-09-17 NOTE — Telephone Encounter (Signed)
 Pt son called in stating pt is having "trembles, shakes with her arms" He states she cannot use her hand and legs without assistance. He states symptoms started Thursday and he thinks they are related to recent mediation changes.

## 2023-09-17 NOTE — Telephone Encounter (Signed)
 Please call director back on her mobile number [phone]

## 2023-09-17 NOTE — Telephone Encounter (Signed)
 Returned call to son who states that [facility]. [facility] care director [facility] but she was on another call ( [phone]). She will call back when free.

## 2023-09-17 NOTE — Telephone Encounter (Signed)
 Spoke with [employer], Interior and spatial designer of the [facility] [place] who says that patient's son was concerned about tremors in hands that started this past Saturday 09/14/2023, and thinks it could be caused by the medication change. Per [facility] patient was prescribed Erythromycin 500 mg QID for 10 days, started on 09/10/2023 by Dr. Eloy Half (PCP). Advised that she needed to contact PCP with this complaint, hand tremors and thinks the erythromycin is causing it. Verbalized understanding.

## 2023-09-30 ENCOUNTER — Ambulatory Visit: Attending: Cardiology

## 2023-09-30 DIAGNOSIS — I5032 Chronic diastolic (congestive) heart failure: Secondary | ICD-10-CM | POA: Diagnosis not present

## 2023-10-01 LAB — ECHOCARDIOGRAM COMPLETE
AR max vel: 1.67 cm2
AV Area VTI: 1.85 cm2
AV Area mean vel: 1.81 cm2
AV Mean grad: 6 mmHg
AV Peak grad: 12.1 mmHg
Ao pk vel: 1.74 m/s
Area-P 1/2: 2.45 cm2
Calc EF: 67.9 %
MV VTI: 2.15 cm2
S' Lateral: 2.9 cm
Single Plane A2C EF: 72.5 %
Single Plane A4C EF: 63.4 %

## 2023-10-07 ENCOUNTER — Ambulatory Visit: Payer: Self-pay

## 2023-10-09 ENCOUNTER — Encounter: Payer: Self-pay | Admitting: *Deleted

## 2023-10-15 ENCOUNTER — Other Ambulatory Visit: Payer: Self-pay

## 2023-10-15 ENCOUNTER — Encounter (HOSPITAL_COMMUNITY): Payer: Self-pay

## 2023-10-15 ENCOUNTER — Emergency Department (HOSPITAL_COMMUNITY)

## 2023-10-15 ENCOUNTER — Emergency Department (HOSPITAL_COMMUNITY)
Admission: EM | Admit: 2023-10-15 | Discharge: 2023-10-16 | Disposition: A | Discharge status: Home / Self Care | Attending: Emergency Medicine | Admitting: Emergency Medicine

## 2023-10-15 DIAGNOSIS — R600 Localized edema: Secondary | ICD-10-CM | POA: Insufficient documentation

## 2023-10-15 DIAGNOSIS — Z86718 Personal history of other venous thrombosis and embolism: Secondary | ICD-10-CM | POA: Insufficient documentation

## 2023-10-15 DIAGNOSIS — Z91040 Latex allergy status: Secondary | ICD-10-CM | POA: Diagnosis not present

## 2023-10-15 DIAGNOSIS — M79605 Pain in left leg: Secondary | ICD-10-CM | POA: Diagnosis present

## 2023-10-15 DIAGNOSIS — Z7901 Long term (current) use of anticoagulants: Secondary | ICD-10-CM | POA: Diagnosis not present

## 2023-10-15 DIAGNOSIS — X501XXA Overexertion from prolonged static or awkward postures, initial encounter: Secondary | ICD-10-CM | POA: Insufficient documentation

## 2023-10-15 DIAGNOSIS — I509 Heart failure, unspecified: Secondary | ICD-10-CM | POA: Insufficient documentation

## 2023-10-15 DIAGNOSIS — T148XXA Other injury of unspecified body region, initial encounter: Secondary | ICD-10-CM

## 2023-10-15 LAB — BASIC METABOLIC PANEL WITH GFR
Anion gap: 10 (ref 5–15)
BUN: 45 mg/dL — ABNORMAL HIGH (ref 8–23)
CO2: 28 mmol/L (ref 22–32)
Calcium: 8.6 mg/dL — ABNORMAL LOW (ref 8.9–10.3)
Chloride: 94 mmol/L — ABNORMAL LOW (ref 98–111)
Creatinine, Ser: 2.79 mg/dL — ABNORMAL HIGH (ref 0.44–1.00)
GFR, Estimated: 17 mL/min — ABNORMAL LOW (ref >60)
Glucose, Bld: 94 mg/dL (ref 70–99)
Potassium: 4.2 mmol/L (ref 3.5–5.1)
Sodium: 132 mmol/L — ABNORMAL LOW (ref 135–145)

## 2023-10-15 LAB — CBC WITH DIFFERENTIAL/PLATELET
Abs Immature Granulocytes: 0.02 10*3/uL (ref 0.00–0.07)
Basophils Absolute: 0 10*3/uL (ref 0.0–0.1)
Basophils Relative: 1 %
Eosinophils Absolute: 0.2 10*3/uL (ref 0.0–0.5)
Eosinophils Relative: 3 %
HCT: 37.6 % (ref 36.0–46.0)
Hemoglobin: 12.2 g/dL (ref 12.0–15.0)
Immature Granulocytes: 0 %
Lymphocytes Relative: 20 %
Lymphs Abs: 1.2 10*3/uL (ref 0.7–4.0)
MCH: 29.1 pg (ref 26.0–34.0)
MCHC: 32.4 g/dL (ref 30.0–36.0)
MCV: 89.7 fL (ref 80.0–100.0)
Monocytes Absolute: 0.5 10*3/uL (ref 0.1–1.0)
Monocytes Relative: 9 %
Neutro Abs: 4 10*3/uL (ref 1.7–7.7)
Neutrophils Relative %: 67 %
Platelets: 218 10*3/uL (ref 150–400)
RBC: 4.19 MIL/uL (ref 3.87–5.11)
RDW: 13.2 % (ref 11.5–15.5)
WBC: 6 10*3/uL (ref 4.0–10.5)
nRBC: 0 % (ref 0.0–0.2)

## 2023-10-15 NOTE — ED Provider Notes (Signed)
 [place] EMERGENCY DEPARTMENT AT [facility] Provider Note   CSN: [id number] Arrival date & time: 10/15/23  1900     History  Chief Complaint  Patient presents with   Leg Pain    [name] is a 78 y.o. female.  78 year old female with a history of lymphedema, CHF, DVT on Eliquis  and status post IVC filter who presents emergency department with left leg pain.  Patient reports that 5 days ago she was wheeling herself around and using her legs to pull herself on her wheelchair when she started having some left leg pain.  Says that this persisted.  Is requiring some assistance at her facility.  Denies any back pain to me though her facility says that she was complaining of it before.  Says that the redness of her legs is at baseline.  No fevers.         Home Medications Prior to Admission medications   Medication Sig Start Date End Date Taking? Authorizing Provider  ammonium lactate (AMLACTIN) 12 % cream Apply 1 Application topically as needed for dry skin. 08/09/23   [provider]  apixaban  (ELIQUIS ) 5 MG TABS tablet Take 5 mg by mouth 2 (two) times daily.    [provider]  erythromycin base (E-MYCIN) 500 MG tablet Take 500 mg by mouth 4 (four) times daily. 09/08/23   [provider]  ferrous sulfate  325 (65 FE) MG tablet Take 1 tablet (325 mg total) by mouth daily with breakfast. 10/01/22   Colin Dawley, MD  furosemide  (LASIX ) 40 MG tablet Take 1 tablet (40 mg total) by mouth daily. 05/09/23   Laurann Pollock, MD  gabapentin  (NEURONTIN ) 100 MG capsule Take 200 mg by mouth 2 (two) times daily. 09/08/23   [provider]  glucose 4 GM chewable tablet Chew 1 tablet (4 g total) by mouth as needed for low blood sugar. Patient not taking: Reported on 09/09/2023 10/01/22   Colin Dawley, MD  GOODSENSE PAIN RELIEF EXTRA ST 500 MG tablet Take 500 mg by mouth every 8 (eight) hours as needed for mild pain (pain score 1-3). Patient not  taking: Reported on 09/09/2023 07/01/23   [provider]  levothyroxine  (SYNTHROID ) 75 MCG tablet Take 1 tablet (75 mcg total) by mouth daily. 05/07/22   Gaylyn Keas Mary-Margaret, FNP  magnesium  oxide (MAG-OX) 400 (240 Mg) MG tablet Take 400 mg by mouth daily.    [provider]  methocarbamol (ROBAXIN) 500 MG tablet Take 1,000 mg by mouth 3 (three) times daily as needed for muscle spasms. 07/31/23   [provider]  mirtazapine  (REMERON ) 7.5 MG tablet Take 1 tablet (7.5 mg total) by mouth at bedtime. For appetite stimulation 10/28/22   Colin Dawley, MD  Multiple Vitamin (MULTIVITAMIN) tablet Take 1 tablet by mouth daily.    [provider]  oxyCODONE  (OXY IR/ROXICODONE ) 5 MG immediate release tablet Take 1 tablet (5 mg total) by mouth daily as needed for severe pain. 01/01/23   Mason Sole, Pratik D, DO  rOPINIRole  (REQUIP  XL) 2 MG 24 hr tablet Take 1 tablet (2 mg total) by mouth at bedtime. 01/01/23   Mason Sole, Pratik D, DO      Allergies    Celebrex [celecoxib], Keflex [cephalexin], Penicillins, Sulfa antibiotics, Kenalog [triamcinolone  acetonide], Latex, Mobic [meloxicam], and Zestril [lisinopril]    Review of Systems   Review of Systems  Physical Exam Updated Vital Signs BP 104/75   Pulse 60   Temp 97.9 F (36.6 C) (Oral)  Resp 16   Ht 5\' 4"  (1.626 m)   Wt 94.9 kg   SpO2 99%   BMI 35.91 kg/m  Physical Exam Musculoskeletal:     Right lower leg: Edema present.     Left lower leg: Edema present.     Comments: No spinal midline TTP in cervical, thoracic, or lumbar spine. No stepoffs noted.   Motor: Muscle bulk and tone are normal. Strength is 5/5 in hip flexion, knee flexion and extension, ankle dorsiflexion and plantar flexion bilaterally. Full strength of great toe dorsiflexion bilaterally.  Sensory: Intact sensation to light touch in L2 though S1 dermatomes bilaterally.   No tenderness to palpation of bilateral hips, knees, or ankles.    ED Results /  Procedures / Treatments   Labs (all labs ordered are listed, but only abnormal results are displayed) Labs Reviewed  BASIC METABOLIC PANEL WITH GFR - Abnormal; Notable for the following components:      Result Value   Sodium 132 (*)    Chloride 94 (*)    BUN 45 (*)    Creatinine, Ser 2.79 (*)    Calcium  8.6 (*)    GFR, Estimated 17 (*)    All other components within normal limits  CBC WITH DIFFERENTIAL/PLATELET    EKG None  Radiology DG Knee Complete 4 Views Left Result Date: 10/15/2023 CLINICAL DATA:  Evaluate for fracture EXAM: LEFT KNEE - COMPLETE 4+ VIEW COMPARISON:  Left femur x-ray 10/15/2023 FINDINGS: The bones are osteopenic. There is no acute fracture or dislocation. There is severe degenerative changes of the medial compartment with joint space narrowing and sclerosis. Large superior osteophytes are seen at the patellofemoral joint, unchanged. Soft tissues are within normal limits. IMPRESSION: 1. No acute fracture or dislocation. 2. Severe degenerative changes of the medial compartment and patellofemoral joint. Electronically Signed   By: Tyron Gallon M.D.   On: 10/15/2023 23:23   DG Femur Min 2 Views Left Result Date: 10/15/2023 CLINICAL DATA:  back pain EXAM: LEFT FEMUR 2 VIEWS COMPARISON:  None Available. FINDINGS: Acute transverse fracture of the superior patellar pole. No acute displaced fracture or dislocation of the left hip. No acute displaced fracture of the femur. Severe tricompartmental degenerative changes of the knee. No dislocation of the knee. Small joint effusion. Subcutaneus soft tissue edema. IMPRESSION: 1. Acute transverse fracture of the superior patellar pole. Recommend dedicated at least three view knee radiograph. 2. Severe tricompartmental degenerative changes of the knee. Electronically Signed   By: Morgane  Naveau M.D.   On: 10/15/2023 21:51   DG Lumbar Spine Complete Result Date: 10/15/2023 CLINICAL DATA:  Back pain EXAM: LUMBAR SPINE - COMPLETE 4+  VIEW COMPARISON:  Lumbar spine x-ray 08/29/2022. CT abdomen and pelvis 10/19/2022. FINDINGS: The bones are diffusely osteopenic. There is no evidence for fracture. Spinal alignment is difficult to evaluate, but grossly within normal limits. Bilateral transpedicular screws and posterior fusion rods are seen at L4, L5-S1. Disc spaces are seen at these levels. No hardware loosening identified. IVC filter noted. IMPRESSION: 1. No acute fracture or traumatic subluxation of the lumbar spine. 2. Postsurgical changes at L4, L5-S1. Electronically Signed   By: Tyron Gallon M.D.   On: 10/15/2023 21:49    Procedures Procedures    Medications Ordered in ED Medications - No data to display  ED Course/ Medical Decision Making/ A&P Clinical Course as of 10/16/23 0131  Wed Oct 15, 2023  2321 Creatinine(!): 2.79 At baseline [RP]    Clinical Course User Index [  RP] Ninetta Basket, MD                                 Medical Decision Making Amount and/or Complexity of Data Reviewed Labs: ordered. Decision-making details documented in ED Course. Radiology: ordered.   78 year old female with history of lymphedema, CHF, DVT on Eliquis  and IVC filter presents emergency department with left leg pain  Initial Ddx:  Lumbar strain, hamstring strain, pathologic fracture, spinal cord compression, DVT, cellulitis  MDM/Course:  Patient presents emergency department with left lower extremity pain.  Appears to be worsened after she was bending herself around her ability.  On exam does not have any obvious deformities.  No tenderness to palpation.  The facility also reports that she complaining of some back pain with them but to me denies any back pain currently.  No spinal midline tenderness to palpation.  No neurologic deficits distally.  Does have some erythema but she reports that this is difficult for her and I suspect this is from her venous stasis dermatitis.  She had x-rays of her lumbar spine and femur  which showed possible patellar fracture.  On dedicated knee x-rays did not show any evidence of fracture she is not tender there so I feel that is not the case.  Suspect she may have pulled a muscle or potentially have some radiculopathy.  Will have her follow-up with her orthopedic doctor in several days.  This patient presents to the ED for concern of complaints listed in HPI, this involves an extensive number of treatment options, and is a complaint that carries with it a high risk of complications and morbidity. Disposition including potential need for admission considered.   Dispo: DC to Facility  Additional history obtained from Nursing Home/Care Facility Records reviewed Outpatient Clinic Notes The following labs were independently interpreted: Chemistry and show CKD I independently reviewed the following imaging with scope of interpretation limited to determining acute life threatening conditions related to emergency care: Extremity x-ray(s) and agree with the radiologist interpretation with the following exceptions: none I have reviewed the patients home medications and made adjustments as needed Social Determinants of health:  Geriatric  Portions of this note were generated with Scientist, clinical (histocompatibility and immunogenetics). Dictation errors may occur despite best attempts at proofreading.     Final Clinical Impression(s) / ED Diagnoses Final diagnoses:  Left leg pain  Pulled muscle    Rx / DC Orders ED Discharge Orders     None         Ninetta Basket, MD 10/16/23 [id number]

## 2023-10-15 NOTE — ED Notes (Signed)
 Pt stated that she's had to be pushed in a wheelchair more than usual and staff at the facility has had to help her get up and down. She feels as this is a reason as to why they called for her to come. She reports taking antibiotics for the cellulitis as prescribed. She said the left leg pain has been there for a couple of days and just feels as though she pulled something.

## 2023-10-15 NOTE — ED Triage Notes (Signed)
 Pt has been battling leg swelling and cellulitis for 'awhile'. Has been seen for the same thing x3 yet nothing is getting better.

## 2023-10-15 NOTE — Discharge Instructions (Addendum)
 You were seen for your leg pain.  Is likely from a pulled muscle.  Take Tylenol  and use over-the-counter lidocaine  patches as needed for your pain.  Please follow-up with your orthopedic doctor in the next week about it.

## 2023-10-16 NOTE — ED Notes (Signed)
 Facility aware of pt being transported back to the facility

## 2023-10-16 NOTE — ED Notes (Signed)
 [facility] called to transport pt back to the [facility]. Facility was notified of discharge and report was given.

## 2023-10-21 ENCOUNTER — Encounter (HOSPITAL_BASED_OUTPATIENT_CLINIC_OR_DEPARTMENT_OTHER): Attending: General Surgery | Admitting: General Surgery

## 2023-10-21 DIAGNOSIS — L97819 Non-pressure chronic ulcer of other part of right lower leg with unspecified severity: Secondary | ICD-10-CM | POA: Diagnosis not present

## 2023-10-21 DIAGNOSIS — I890 Lymphedema, not elsewhere classified: Secondary | ICD-10-CM | POA: Insufficient documentation

## 2023-10-21 DIAGNOSIS — I872 Venous insufficiency (chronic) (peripheral): Secondary | ICD-10-CM | POA: Insufficient documentation

## 2023-10-21 DIAGNOSIS — N1832 Chronic kidney disease, stage 3b: Secondary | ICD-10-CM | POA: Diagnosis not present

## 2023-10-21 DIAGNOSIS — L97829 Non-pressure chronic ulcer of other part of left lower leg with unspecified severity: Secondary | ICD-10-CM | POA: Insufficient documentation

## 2023-10-23 ENCOUNTER — Ambulatory Visit: Attending: Nurse Practitioner | Admitting: Nurse Practitioner

## 2023-10-23 ENCOUNTER — Encounter: Payer: Self-pay | Admitting: Nurse Practitioner

## 2023-10-23 VITALS — BP 122/66 | HR 64 | Ht 64.0 in | Wt 215.0 lb

## 2023-10-23 DIAGNOSIS — I10 Essential (primary) hypertension: Secondary | ICD-10-CM | POA: Insufficient documentation

## 2023-10-23 DIAGNOSIS — Z79899 Other long term (current) drug therapy: Secondary | ICD-10-CM | POA: Insufficient documentation

## 2023-10-23 DIAGNOSIS — I890 Lymphedema, not elsewhere classified: Secondary | ICD-10-CM | POA: Diagnosis not present

## 2023-10-23 DIAGNOSIS — Z86718 Personal history of other venous thrombosis and embolism: Secondary | ICD-10-CM | POA: Diagnosis present

## 2023-10-23 DIAGNOSIS — I5033 Acute on chronic diastolic (congestive) heart failure: Secondary | ICD-10-CM | POA: Insufficient documentation

## 2023-10-23 DIAGNOSIS — R600 Localized edema: Secondary | ICD-10-CM

## 2023-10-23 DIAGNOSIS — I4891 Unspecified atrial fibrillation: Secondary | ICD-10-CM | POA: Insufficient documentation

## 2023-10-23 DIAGNOSIS — I509 Heart failure, unspecified: Secondary | ICD-10-CM

## 2023-10-23 MED ORDER — FUROSEMIDE 40 MG PO TABS: 40.0000 mg | ORAL_TABLET | Freq: Two times a day (BID) | ORAL | 1 refills | Status: AC

## 2023-10-23 NOTE — Progress Notes (Addendum)
 Cardiology Office Note:  .   Date:  10/23/2023 ID:  [name], DOB 14-Jul-1945, MRN [id number] PCP: Ginnie Laine, NP  [facility] Providers Cardiologist:  Armida Lander, MD    History of Present Illness: .   [name] is a 78 y.o. female with a PMH of chronic diastolic CHF, hypertension, history of C. difficile colitis, history of DVT, history of GI bleed, history of IVC filter, isolated episode of A-fib in 2023 in the setting of septic shock, and leg edema/lymphedema, CKD, presents today for follow-up.  Newly diagnosed with A-fib during hospital admission in 12/2021, was in septic shock, had SBO also at the time. 30 day monitor showed no recurrent A-fib. Hx of IVC filter and GI bleed in past while on OAC. Hx of past DVT.   Hospital admission 09/2022 for C-diff colitis.   06/17/2023 - Today she presents for 3-4 week follow-up. She states she has not noticed a difference in her weight/swelling since she was last in our office. Weight is up 3 lbs from last office visit. Says she is working to get better at using her compression pumps. Does admit to some dietary indiscretions. Denies any chest pain, shortness of breath, palpitations, syncope, presyncope, dizziness, orthopnea, PND, acute bleeding, or claudication.  Last seen by Dr. Armida Lander on September 09, 2023. Noticed a significant uptrend in weight with increased abdominal swelling and leg edema. Lasix  was increased to 40 mg daily and recommended to continue to use home lymphedema pump treatments with close follow-up on labs.   ED visit on 10/15/2023 for left leg pain at [facility] ED. imaging revealed postsurgical changes at L4, L5-S1 with no acute fracture or traumatic subluxation of lumbar spine.  X-ray imaging of left knee revealed no acute fracture or dislocation with severe degenerative changes of medial compartment and patellofemoral joint.  X-ray of left femur showed acute transverse fracture of superior patellar pole and  recommended dedicated at least 3 view knee radiograph with severe tricompartmental degenerative changes of the knee.  Today she presents for follow-up with her care assistant.  History is limited with patient due to confusion from past hospital visits/doctors visits and what appears to be some slight short term memory loss. Says she has noticed increased leg swelling. Left leg is currently wrapped.  Also being followed by wound care center due to patient in the past picking left wound along the left lateral lower leg.  Has not followed up with orthopedics since ED visit on 10/15/2023. Denies any chest pain, shortness of breath, palpitations, syncope, presyncope, dizziness, orthopnea, PND, acute bleeding, or claudication.  ROS: Negative. See HPI.  SH: Lives at [facility]).  Studies Reviewed: Aaron Aas    EKG: EKG is not ordered today.  Echo 09/2023:  1. Left ventricular ejection fraction, by estimation, is 65 to 70%. The  left ventricle has normal function. The left ventricle has no regional  wall motion abnormalities. Left ventricular diastolic parameters are  consistent with Grade I diastolic  dysfunction (impaired relaxation). Elevated left atrial pressure. The  average left ventricular global longitudinal strain is -17.9 %. The global  longitudinal strain is normal.   2. Right ventricular systolic function is normal. The right ventricular  size is normal. There is normal pulmonary artery systolic pressure.   3. The mitral valve is normal in structure. No evidence of mitral valve  regurgitation. No evidence of mitral stenosis.   4. The tricuspid valve is abnormal.   5. The  aortic valve is tricuspid. There is mild calcification of the  aortic valve. There is mild thickening of the aortic valve. Aortic valve  regurgitation is not visualized. No aortic stenosis is present.   6. The inferior vena cava is normal in size with greater than 50%  respiratory variability, suggesting  right atrial pressure of 3 mmHg.   Comparison(s): A prior study was performed on 10/21/2022. EF 65-70%. Mildly  elevated PASP. The RVSP is 40.0 mmHg.     Echo 09/2022:  1. Left ventricular ejection fraction, by estimation, is 65 to 70%. The  left ventricle has normal function. The left ventricle has no regional  wall motion abnormalities. Left ventricular diastolic parameters are  consistent with Grade I diastolic  dysfunction (impaired relaxation).   2. Right ventricular systolic function is normal. The right ventricular  size is normal. There is mildly elevated pulmonary artery systolic  pressure. The estimated right ventricular systolic pressure is 40.0 mmHg.   3. Left atrial size was moderately dilated.   4. The mitral valve is normal in structure. Trivial mitral valve  regurgitation.   5. The aortic valve is tricuspid. Aortic valve regurgitation is trivial.  Aortic valve sclerosis/calcification is present, without any evidence of  aortic stenosis.   6. The inferior vena cava is dilated in size with <50% respiratory  variability, suggesting right atrial pressure of 15 mmHg.   7. Cannot exclude a small PFO.   Comparison(s): Changes from prior study are noted. 05/21/2022: LVEF 60-65%, normal diastolic function.   Cardiac monitor 04/2022:   30 day monitor   Min HR 39, max HR 110, avg HR 53   No symptoms reported   Telemetry tracings show sinus rhythm,rare short runs of NSVT   No afib or aflutter noted Risk Assessment/Calculations:      The 10-year ASCVD risk score (Arnett DK, et al., 2019) is: 23.6%   Values used to calculate the score:     Age: 75 years     Sex: Female     Is Non-Hispanic African American: No     Diabetic: No     Tobacco smoker: No     Systolic Blood Pressure: 122 mmHg     Is BP treated: Yes     HDL Cholesterol: 56 mg/dL     Total Cholesterol: 161 mg/dL  Physical Exam:   VS:  BP 122/66   Pulse 64   Ht 5\' 4"  (1.626 m)   Wt 215 lb (97.5 kg)   SpO2  97%   BMI 36.90 kg/m    Wt Readings from Last 3 Encounters:  10/23/23 215 lb (97.5 kg)  10/15/23 209 lb 3.5 oz (94.9 kg)  09/09/23 209 lb 3.2 oz (94.9 kg)    GEN: Obese, 79 y.o. female in no acute distress, sitting in wheelchair NECK: No JVD; No carotid bruits CARDIAC: S1/S2, RRR, no murmurs, rubs, gallops RESPIRATORY:  Clear and diminished to auscultation without rales, wheezing or rhonchi  ABDOMEN: Soft, non-tender, non-distended EXTREMITIES:  Gross generalized nonpitting edema with gross lymphedema to BLE; No deformity   ASSESSMENT AND PLAN: .    Acute on chronic diastolic CHF, lymphedema, medication management Stage C, NYHA class I-II symptoms. EF 65-70%. Significant weight gain, swelling, and lymphedema. Weight is up despite increase in Lasix . Will double Lasix  and increase to Lasix  40 mg BID. Will obtain BMET, proBNP, and magnesium  level in 1 week. Continue rest of medication regimen. Low sodium diet, fluid restriction <2L, and daily weights encouraged. Educated to  contact our office for weight gain of 2 lbs overnight or 5 lbs in one week. Encouraged f/u with Ortho surgeon prior to consistent use of compression pumps at home.   HTN BP stable. Discussed to monitor BP at home at least 2 hours after medications and sitting for 5-10 minutes. No medication changes at this time. Heart healthy diet and regular cardiovascular exercise encouraged.   Isolated A-fib episode, hx of DVT Denies any tachycardia or palpitations. Occurred 12/2021 in setting of septic shock, also tx for SBO at time. Past 30 day monitor did not show recurrence of A-fib. Remains on Eliquis  and denies any bleeding issues, on appropriate dosage. Doppler 12/2022 of lower extremities was negative for DVT. Most recent doppler on 08/22/2023 was negative for DVT. Continue Eliquis  5 mg BID.     Dispo: Follow-up with [facility] in 2-3 months or sooner if anything changes.   Signed, Lasalle Pointer, NP

## 2023-10-23 NOTE — Patient Instructions (Addendum)
 Medication Instructions:  Your physician has recommended you make the following change in your medication:  Please Increase lasix  to 40 Mg Twice daily   Labwork: In 1 week with [facility]   Testing/Procedures: None   Follow-Up: Your physician recommends that you schedule a follow-up appointment in: 2-3 months   Any Other Special Instructions Will Be Listed Below (If Applicable).  If you need a refill on your cardiac medications before your next appointment, please call your pharmacy.

## 2023-10-30 ENCOUNTER — Encounter (HOSPITAL_BASED_OUTPATIENT_CLINIC_OR_DEPARTMENT_OTHER): Admitting: General Surgery

## 2023-11-10 ENCOUNTER — Encounter (HOSPITAL_BASED_OUTPATIENT_CLINIC_OR_DEPARTMENT_OTHER): Attending: General Surgery | Admitting: General Surgery

## 2023-11-10 ENCOUNTER — Encounter (HOSPITAL_COMMUNITY): Payer: Self-pay | Admitting: *Deleted

## 2023-11-10 ENCOUNTER — Other Ambulatory Visit: Payer: Self-pay

## 2023-11-10 ENCOUNTER — Emergency Department (HOSPITAL_COMMUNITY)

## 2023-11-10 ENCOUNTER — Emergency Department (HOSPITAL_COMMUNITY)
Admission: EM | Admit: 2023-11-10 | Discharge: 2023-11-10 | Disposition: A | Discharge status: Home / Self Care | Attending: Emergency Medicine | Admitting: Emergency Medicine

## 2023-11-10 DIAGNOSIS — I509 Heart failure, unspecified: Secondary | ICD-10-CM | POA: Diagnosis not present

## 2023-11-10 DIAGNOSIS — Z7901 Long term (current) use of anticoagulants: Secondary | ICD-10-CM | POA: Diagnosis not present

## 2023-11-10 DIAGNOSIS — L03115 Cellulitis of right lower limb: Secondary | ICD-10-CM | POA: Diagnosis not present

## 2023-11-10 DIAGNOSIS — Z91040 Latex allergy status: Secondary | ICD-10-CM | POA: Insufficient documentation

## 2023-11-10 DIAGNOSIS — L03119 Cellulitis of unspecified part of limb: Secondary | ICD-10-CM

## 2023-11-10 DIAGNOSIS — M79669 Pain in unspecified lower leg: Secondary | ICD-10-CM | POA: Diagnosis present

## 2023-11-10 DIAGNOSIS — L03116 Cellulitis of left lower limb: Secondary | ICD-10-CM | POA: Insufficient documentation

## 2023-11-10 DIAGNOSIS — E876 Hypokalemia: Secondary | ICD-10-CM | POA: Insufficient documentation

## 2023-11-10 LAB — BASIC METABOLIC PANEL WITH GFR
Anion gap: 11 (ref 5–15)
BUN: 37 mg/dL — ABNORMAL HIGH (ref 8–23)
CO2: 31 mmol/L (ref 22–32)
Calcium: 8.9 mg/dL (ref 8.9–10.3)
Chloride: 95 mmol/L — ABNORMAL LOW (ref 98–111)
Creatinine, Ser: 2.33 mg/dL — ABNORMAL HIGH (ref 0.44–1.00)
GFR, Estimated: 21 mL/min — ABNORMAL LOW (ref >60)
Glucose, Bld: 90 mg/dL (ref 70–99)
Potassium: 3.4 mmol/L — ABNORMAL LOW (ref 3.5–5.1)
Sodium: 137 mmol/L (ref 135–145)

## 2023-11-10 LAB — CBC WITH DIFFERENTIAL/PLATELET
Abs Immature Granulocytes: 0.02 10*3/uL (ref 0.00–0.07)
Basophils Absolute: 0 10*3/uL (ref 0.0–0.1)
Basophils Relative: 1 %
Eosinophils Absolute: 0.3 10*3/uL (ref 0.0–0.5)
Eosinophils Relative: 5 %
HCT: 36.9 % (ref 36.0–46.0)
Hemoglobin: 12.1 g/dL (ref 12.0–15.0)
Immature Granulocytes: 0 %
Lymphocytes Relative: 26 %
Lymphs Abs: 1.5 10*3/uL (ref 0.7–4.0)
MCH: 28.7 pg (ref 26.0–34.0)
MCHC: 32.8 g/dL (ref 30.0–36.0)
MCV: 87.6 fL (ref 80.0–100.0)
Monocytes Absolute: 0.7 10*3/uL (ref 0.1–1.0)
Monocytes Relative: 11 %
Neutro Abs: 3.4 10*3/uL (ref 1.7–7.7)
Neutrophils Relative %: 57 %
Platelets: 388 10*3/uL (ref 150–400)
RBC: 4.21 MIL/uL (ref 3.87–5.11)
RDW: 13.2 % (ref 11.5–15.5)
WBC: 6 10*3/uL (ref 4.0–10.5)
nRBC: 0 % (ref 0.0–0.2)

## 2023-11-10 LAB — LACTIC ACID, PLASMA: Lactic Acid, Venous: 1.3 mmol/L (ref 0.5–1.9)

## 2023-11-10 MED ORDER — SODIUM CHLORIDE 0.9 % IV SOLN
100.0000 mg | Freq: Two times a day (BID) | INTRAVENOUS | Status: DC
  Administered 2023-11-10: 100 mg via INTRAVENOUS
  Filled 2023-11-10 (×3): qty 100

## 2023-11-10 MED ORDER — ACETAMINOPHEN 325 MG PO TABS
650.0000 mg | ORAL_TABLET | Freq: Once | ORAL | Status: AC
  Administered 2023-11-10: 650 mg via ORAL
  Filled 2023-11-10: qty 2

## 2023-11-10 MED ORDER — DOXYCYCLINE HYCLATE 100 MG PO CAPS: 100.0000 mg | ORAL_CAPSULE | Freq: Two times a day (BID) | ORAL | 0 refills | Status: AC

## 2023-11-10 MED ORDER — FENTANYL CITRATE PF 50 MCG/ML IJ SOSY
25.0000 ug | PREFILLED_SYRINGE | Freq: Once | INTRAMUSCULAR | Status: AC
  Administered 2023-11-10: 25 ug via INTRAVENOUS
  Filled 2023-11-10: qty 1

## 2023-11-10 NOTE — ED Notes (Signed)
 ED Provider at bedside.

## 2023-11-10 NOTE — ED Triage Notes (Signed)
 Pt with sent here for wound to left foot and left heel, reported first noted on Saturday.  Pt with cellulitis to bilateral LE's

## 2023-11-10 NOTE — ED Provider Notes (Addendum)
 [place] EMERGENCY DEPARTMENT AT [facility] Provider Note   CSN: [id number] Arrival date & time: 11/10/23  1222     Patient presents with: Wound Check   [name] is a 78 y.o. female.   Patient is a 78 year old female with past medical history of CHF, lymphedema, lower extremity cellulitis with history of sepsis, and previous admission for C. difficile colitis in May 2024 presenting for lower extremity pain.  Patient admits to significant increase in baseline lower extremity swelling, new redness, and new pain.  She also has a history of DVTs, IVC filter, on eliquis . Chart review demonstrates in March 2025 she had bilateral lower ext US  demonstrating: NO definite deep venous thrombosis.    The history is provided by the patient. No language interpreter was used.  Wound Check Pertinent negatives include no chest pain, no abdominal pain and no shortness of breath.       Prior to Admission medications   Medication Sig Start Date End Date Taking? Authorizing Provider  apixaban  (ELIQUIS ) 5 MG TABS tablet Take 5 mg by mouth 2 (two) times daily.   Yes [provider]  doxycycline  (VIBRAMYCIN ) 100 MG capsule Take 1 capsule (100 mg total) by mouth 2 (two) times daily for 10 days. 11/10/23 11/20/23 Yes Quinn Bucco, DO  ferrous sulfate  325 (65 FE) MG tablet Take 1 tablet (325 mg total) by mouth daily with breakfast. 10/01/22  Yes Emokpae, Courage, MD  furosemide  (LASIX ) 40 MG tablet Take 1 tablet (40 mg total) by mouth 2 (two) times daily. 10/23/23  Yes Lasalle Pointer, NP  gabapentin  (NEURONTIN ) 100 MG capsule Take 200 mg by mouth 2 (two) times daily. 09/08/23  Yes [provider]  GOODSENSE PAIN RELIEF EXTRA ST 500 MG tablet Take 500 mg by mouth every 8 (eight) hours as needed for mild pain (pain score 1-3). 07/01/23  Yes [provider]  levothyroxine  (SYNTHROID ) 75 MCG tablet Take 1 tablet (75 mcg total) by mouth daily. 05/07/22  Yes Gaylyn Keas,  Mary-Margaret, FNP  magnesium  oxide (MAG-OX) 400 (240 Mg) MG tablet Take 400 mg by mouth daily.   Yes [provider]  methocarbamol (ROBAXIN) 500 MG tablet Take 1,000 mg by mouth 3 (three) times daily as needed for muscle spasms. 07/31/23  Yes [provider]  mirtazapine  (REMERON ) 7.5 MG tablet Take 1 tablet (7.5 mg total) by mouth at bedtime. For appetite stimulation 10/28/22  Yes Emokpae, Courage, MD  Multiple Vitamin (MULTIVITAMIN) tablet Take 1 tablet by mouth daily.   Yes [provider]  oxyCODONE  (OXY IR/ROXICODONE ) 5 MG immediate release tablet Take 1 tablet (5 mg total) by mouth daily as needed for severe pain. 01/01/23  Yes Shah, Pratik D, DO  rOPINIRole  (REQUIP  XL) 2 MG 24 hr tablet Take 1 tablet (2 mg total) by mouth at bedtime. 01/01/23  Yes Shah, Pratik D, DO  sodium bicarbonate  650 MG tablet Take 650 mg by mouth 3 (three) times daily. 10/03/23  Yes [provider]  ammonium lactate (AMLACTIN) 12 % cream Apply 1 Application topically as needed for dry skin. 08/09/23   [provider]  glucose 4 GM chewable tablet Chew 1 tablet (4 g total) by mouth as needed for low blood sugar. Patient not taking: Reported on 09/09/2023 10/01/22   Colin Dawley, MD  nitrofurantoin, macrocrystal-monohydrate, (MACROBID) 100 MG capsule Take 100 mg by mouth 2 (two) times daily. Patient not taking: Reported on 11/10/2023 10/28/23   [provider]    Allergies:  Celebrex [celecoxib], Keflex [cephalexin], Penicillins, Sulfa antibiotics, Kenalog [triamcinolone  acetonide], Latex, Mobic [meloxicam], and Zestril [lisinopril]    Review of Systems  Constitutional:  Negative for chills and fever.  HENT:  Negative for ear pain and sore throat.   Eyes:  Negative for pain and visual disturbance.  Respiratory:  Negative for cough and shortness of breath.   Cardiovascular:  Positive for leg swelling. Negative for chest pain and palpitations.  Gastrointestinal:  Negative for  abdominal pain and vomiting.  Genitourinary:  Negative for dysuria and hematuria.  Musculoskeletal:  Negative for arthralgias and back pain.  Skin:  Positive for color change and wound. Negative for rash.  Neurological:  Negative for seizures and syncope.  All other systems reviewed and are negative.   Updated Vital Signs BP 122/63   Pulse (!) 58   Temp 97.7 F (36.5 C) (Oral)   Resp 16   Ht 5' 4 (1.626 m)   Wt 97.5 kg   SpO2 97%   BMI 36.90 kg/m   Physical Exam Vitals and nursing note reviewed.  Constitutional:      General: She is not in acute distress.    Appearance: She is well-developed.  HENT:     Head: Normocephalic and atraumatic.   Eyes:     Conjunctiva/sclera: Conjunctivae normal.    Cardiovascular:     Rate and Rhythm: Normal rate and regular rhythm.     Pulses:          Dorsalis pedis pulses are 2+ on the right side and 2+ on the left side.     Heart sounds: No murmur heard. Pulmonary:     Effort: Pulmonary effort is normal. No respiratory distress.     Breath sounds: Normal breath sounds.  Abdominal:     Palpations: Abdomen is soft.     Tenderness: There is no abdominal tenderness.   Musculoskeletal:        General: No swelling.     Cervical back: Neck supple.     Right lower leg: 4+ Pitting Edema present.     Left lower leg: 4+ Pitting Edema present.   Skin:    General: Skin is warm and dry.     Capillary Refill: Capillary refill takes less than 2 seconds.     Findings: Erythema present.     Comments: Bilateral pitting edema, erythema, warmth, and tenderness to palpation below the knee to the ankle.  Please see attached photos.   Neurological:     Mental Status: She is alert.     Sensory: Sensation is intact.     Motor: Motor function is intact.   Psychiatric:        Mood and Affect: Mood normal.            (all labs ordered are listed, but only abnormal results are displayed) Labs Reviewed  BASIC METABOLIC PANEL WITH GFR -  Abnormal; Notable for the following components:      Result Value   Potassium 3.4 (*)    Chloride 95 (*)    BUN 37 (*)    Creatinine, Ser 2.33 (*)    GFR, Estimated 21 (*)    All other components within normal limits  CBC WITH DIFFERENTIAL/PLATELET  LACTIC ACID, PLASMA  LACTIC ACID, PLASMA    EKG: None  Radiology: US  Venous Img Lower Bilateral Result Date: 11/10/2023 CLINICAL DATA:  History of DVT.  Bilateral leg swelling. EXAM: BILATERAL LOWER EXTREMITY VENOUS DOPPLER ULTRASOUND TECHNIQUE: Kayen Grabel-scale sonography with graded compression, as  well as color Doppler and duplex ultrasound were performed to evaluate the lower extremity deep venous systems from the level of the common femoral vein and including the common femoral, femoral, profunda femoral, popliteal and calf veins including the posterior tibial, peroneal and gastrocnemius veins when visible. The superficial great saphenous vein was also interrogated. Spectral Doppler was utilized to evaluate flow at rest and with distal augmentation maneuvers in the common femoral, femoral and popliteal veins. COMPARISON:  Ultrasound 08/22/2023 FINDINGS: RIGHT LOWER EXTREMITY Common Femoral Vein: No evidence of thrombus. Normal compressibility, respiratory phasicity and response to augmentation. Saphenofemoral Junction: No evidence of thrombus. Normal compressibility and flow on color Doppler imaging. Profunda Femoral Vein: No evidence of thrombus. Normal compressibility and flow on color Doppler imaging. Femoral Vein: No evidence of thrombus. Normal compressibility, respiratory phasicity and response to augmentation. There is limited visualization of the distal femoral vein with overlapping soft tissue. Popliteal Vein: No evidence of thrombus. Normal compressibility, respiratory phasicity and response to augmentation. Some limitation with overlapping soft tissue. Calf Veins: Not seen with overlapping soft tissue. Superficial Great Saphenous Vein: No  evidence of thrombus. Normal compressibility. Venous Reflux:  None. Other Findings:  None. LEFT LOWER EXTREMITY Common Femoral Vein: No evidence of thrombus. Normal compressibility, respiratory phasicity and response to augmentation. Saphenofemoral Junction: No evidence of thrombus. Normal compressibility and flow on color Doppler imaging. Profunda Femoral Vein: No evidence of thrombus. Normal compressibility and flow on color Doppler imaging. Femoral Vein: No evidence of thrombus. Normal compressibility, respiratory phasicity and response to augmentation. Of note the distal femoral vein is poorly seen with overlapping soft tissue. Popliteal Vein: No evidence of thrombus. Normal compressibility, respiratory phasicity and response to augmentation. Calf Veins: Poorly seen with overlapping soft tissue. Soft tissue edema. Superficial Great Saphenous Vein: No evidence of thrombus. Normal compressibility. Venous Reflux:  None. Other Findings:  None. IMPRESSION: Grossly no evidence of bilateral lower extremity DVT. There are several areas however which are poorly seen with overlapping soft tissue and edema. This includes the distal femoral veins bilaterally and the calf vessels. Some limitation of the right popliteal vein as well. Electronically Signed   By: Adrianna Horde M.D.   On: 11/10/2023 15:08     Procedures   Medications Ordered in the ED  doxycycline  (VIBRAMYCIN ) 100 mg in sodium chloride  0.9 % 250 mL IVPB ( Intravenous Infusion Verify 11/10/23 1524)  fentaNYL  (SUBLIMAZE ) injection 25 mcg (25 mcg Intravenous Given 11/10/23 1341)  acetaminophen  (TYLENOL ) tablet 650 mg (650 mg Oral Given 11/10/23 1340)                                    Medical Decision Making Amount and/or Complexity of Data Reviewed Labs: ordered.  Risk OTC drugs. Prescription drug management.   78 year old female with past medical history of CHF, lymphedema, lower extremity cellulitis with history of sepsis, and previous  admission for C. difficile colitis in May 2024 presenting for lower extremity pain.  On exam patient, no acute distress, afebrile, stable vital signs.  Physical exam demonstrates bilateral pitting edema, erythema, warmth, and tenderness to palpation below the knee to the ankle concerning for cellulitis.  Patient does have chronic wounds on the plantar surface of her left foot.  No purulent drainage.  Please see attached photos.    -Recent antibiotic use: chart review demonstrates script for nitrofurantoin filled on 6/3 for a 7d supply for possible uti. -History to  penicillins and cephalosporins.  -Currently no signs or symptoms of sepsis.  Blood work pending.  Doxycycline  started. -Medication given for pain control.   She also has a history of DVTs, IVC filter, on Eliquis .  Chart review demonstrates in March 2025 she had bilateral lower ext US  demonstrating: NO definite deep venous thrombosis.  Repeat bilateral DVT ultrasound today demonstrates: Grossly no evidence of bilateral lower extremity DVT. There are several areas however which are poorly seen with overlapping soft tissue and edema. This includes the distal femoral veins bilaterally and the calf vessels. Some limitation of the right popliteal vein as well.  Laboratory studies demonstrate no leukocytosis.  No sepsis at this time.  Patient discharged back to facility with prescription for doxycycline  for bilateral lower extremity cellulitis.  Lower extremities are otherwise neurovascularly intact-doubt arterial occlusion.  No crepitus or signs of necrotizing fasciitis.  No previous diagnosis of MRSA on my chart review.  Patient in no distress and overall condition improved here in the ED. Detailed discussions were had with the patient regarding current findings, and need for close f/u with PCP or on call doctor. The patient has been instructed to return immediately if the symptoms worsen in any way for re-evaluation. Patient verbalized  understanding and is in agreement with current care plan. All questions answered prior to discharge.      Final diagnoses:  Cellulitis of lower extremity, bilateral    ED Discharge Orders          Ordered    doxycycline  (VIBRAMYCIN ) 100 MG capsule  2 times daily        11/10/23 1531               Quinn Bucco, DO 11/10/23 1531    Quinn Bucco, DO 11/10/23 1533

## 2023-11-10 NOTE — ED Notes (Signed)
 Waiting for vibramycin  dose from pharmacy

## 2023-11-26 ENCOUNTER — Other Ambulatory Visit: Payer: Self-pay | Admitting: *Deleted

## 2023-11-26 DIAGNOSIS — S81802A Unspecified open wound, left lower leg, initial encounter: Secondary | ICD-10-CM

## 2023-11-27 ENCOUNTER — Ambulatory Visit (HOSPITAL_COMMUNITY)
Admission: RE | Admit: 2023-11-27 | Discharge: 2023-11-27 | Disposition: A | Discharge status: Home / Self Care | Source: Ambulatory Visit | Attending: Vascular Surgery | Admitting: Vascular Surgery

## 2023-11-27 DIAGNOSIS — S81802A Unspecified open wound, left lower leg, initial encounter: Secondary | ICD-10-CM

## 2023-11-27 LAB — VAS US ABI WITH/WO TBI: Left ABI: 1.04

## 2023-12-03 ENCOUNTER — Ambulatory Visit: Payer: Medicare Other

## 2023-12-03 VITALS — BP 122/66 | HR 64 | Ht 64.0 in | Wt 215.0 lb

## 2023-12-03 DIAGNOSIS — Z0000 Encounter for general adult medical examination without abnormal findings: Secondary | ICD-10-CM | POA: Diagnosis not present

## 2023-12-03 DIAGNOSIS — Z13820 Encounter for screening for osteoporosis: Secondary | ICD-10-CM

## 2023-12-03 NOTE — Patient Instructions (Signed)
 [name] , Thank you for taking time out of your busy schedule to complete your Annual Wellness Visit with me. I enjoyed our conversation and look forward to speaking with you again next year. I, as well as your care team,  appreciate your ongoing commitment to your health goals. Please review the following plan we discussed and let me know if I can assist you in the future. Your Game plan/ To Do List    Follow up Visits: Next Medicare AWV with our clinical staff: 12/06/24 at 1:10p.m.    Next Office Visit with your provider: n/a  Clinician Recommendations:  Aim for 30 minutes of exercise or brisk walking, 6-8 glasses of water , and 5 servings of fruits and vegetables each day. Patient is aware and due for the following and will get it done at next office visit: DEXA Scan, Pneumonia, DTAP and Eye Exam.       This is a list of the screening recommended for you and due dates:  Health Maintenance  Topic Date Due   DTaP/Tdap/Td vaccine (1 - Tdap) Never done   Pneumococcal Vaccine for age over 48 (1 of 2 - PCV) Never done   Zoster (Shingles) Vaccine (1 of 2) Never done   DEXA scan (bone density measurement)  Never done   COVID-19 Vaccine (1) 12/18/2024*   Flu Shot  12/26/2023   Medicare Annual Wellness Visit  12/02/2024   Hepatitis C Screening  Completed   Hepatitis B Vaccine  Aged Out   HPV Vaccine  Aged Out   Meningitis B Vaccine  Aged Out  *Topic was postponed. The date shown is not the original due date.    Advanced directives: (In Chart) A copy of your advanced directives are scanned into your chart should your provider ever need it. Advance Care Planning is important because it:  [x]  Makes sure you receive the medical care that is consistent with your values, goals, and preferences  [x]  It provides guidance to your family and loved ones and reduces their decisional burden about whether or not they are making the right decisions based on your wishes.  Follow the link provided in  your after visit summary or read over the paperwork we have mailed to you to help you started getting your Advance Directives in place. If you need assistance in completing these, please reach out to us  so that we can help you!  See attachments for Preventive Care and Fall Prevention Tips.

## 2023-12-03 NOTE — Progress Notes (Signed)
 Subjective:   [name] is a 78 y.o. who presents for a Medicare Wellness preventive visit.  As a reminder, Annual Wellness Visits don't include a physical exam, and some assessments may be limited, especially if this visit is performed virtually. We may recommend an in-person follow-up visit with your provider if needed.  Visit Complete: Virtual I connected with  [name] on 12/03/23 by a audio enabled telemedicine application and verified that I am speaking with the correct person using two identifiers.  Patient Location: Home  Provider Location: Other:  at [facility]  I discussed the limitations of evaluation and management by telemedicine. The patient expressed understanding and agreed to proceed.  Vital Signs: Because this visit was a virtual/telehealth visit, some criteria may be missing or patient reported. Any vitals not documented were not able to be obtained and vitals that have been documented are patient reported.  VideoDeclined- This patient declined Librarian, academic. Therefore the visit was completed with audio only.  Persons Participating in Visit: Patient.  AWV Questionnaire: No: Patient Medicare AWV questionnaire was not completed prior to this visit.  Cardiac Risk Factors include: advanced age (>4men, >13 women);obesity (BMI >30kg/m2);dyslipidemia     Objective:    Today's Vitals   12/03/23 1234  BP: 122/66  Pulse: 64  Weight: 215 lb (97.5 kg)  Height: 5' 4 (1.626 m)   Body mass index is 36.9 kg/m.     12/03/2023   12:54 PM 11/10/2023   12:51 PM 08/22/2023    1:49 PM 08/08/2023   12:12 PM 12/27/2022    2:11 PM 12/02/2022    3:30 PM 11/12/2022    1:02 PM  Advanced Directives  Does Patient Have a Medical Advance Directive? Yes No Yes Yes Yes Yes No  Type of Estate agent of [place];Living will  Healthcare Power of [employer] of [place];Living will Healthcare  Power of [place];Living will Healthcare Power of [place];Living will   Does patient want to make changes to medical advance directive?    No - Patient declined No - Patient declined    Copy of Healthcare Power of Attorney in Chart? Yes - validated most recent copy scanned in chart (See row information)  No - copy requested  No - copy requested No - copy requested   Would patient like information on creating a medical advance directive?       No - Patient declined    Current Medications (verified) Outpatient Encounter Medications as of 12/03/2023  Medication Sig   apixaban  (ELIQUIS ) 5 MG TABS tablet Take 5 mg by mouth 2 (two) times daily.   ferrous sulfate  325 (65 FE) MG tablet Take 1 tablet (325 mg total) by mouth daily with breakfast.   furosemide  (LASIX ) 40 MG tablet Take 1 tablet (40 mg total) by mouth 2 (two) times daily.   gabapentin  (NEURONTIN ) 100 MG capsule Take 200 mg by mouth 2 (two) times daily.   GOODSENSE PAIN RELIEF EXTRA ST 500 MG tablet Take 500 mg by mouth every 8 (eight) hours as needed for mild pain (pain score 1-3).   levothyroxine  (SYNTHROID ) 75 MCG tablet Take 1 tablet (75 mcg total) by mouth daily.   magnesium  oxide (MAG-OX) 400 (240 Mg) MG tablet Take 400 mg by mouth daily.   methocarbamol (ROBAXIN) 500 MG tablet Take 1,000 mg by mouth 3 (three) times daily as needed for muscle spasms.   mirtazapine  (REMERON ) 7.5 MG tablet Take 1 tablet (7.5 mg  total) by mouth at bedtime. For appetite stimulation   Multiple Vitamin (MULTIVITAMIN) tablet Take 1 tablet by mouth daily.   oxyCODONE  (OXY IR/ROXICODONE ) 5 MG immediate release tablet Take 1 tablet (5 mg total) by mouth daily as needed for severe pain.   rOPINIRole  (REQUIP  XL) 2 MG 24 hr tablet Take 1 tablet (2 mg total) by mouth at bedtime.   sodium bicarbonate  650 MG tablet Take 650 mg by mouth 3 (three) times daily.   ammonium lactate (AMLACTIN) 12 % cream Apply 1 Application topically as needed for dry skin. (Patient not  taking: Reported on 12/03/2023)   glucose 4 GM chewable tablet Chew 1 tablet (4 g total) by mouth as needed for low blood sugar. (Patient not taking: Reported on 12/03/2023)   nitrofurantoin, macrocrystal-monohydrate, (MACROBID) 100 MG capsule Take 100 mg by mouth 2 (two) times daily. (Patient not taking: Reported on 12/03/2023)   No facility-administered encounter medications on file as of 12/03/2023.    Allergies (verified) Celebrex [celecoxib], Keflex [cephalexin], Penicillins, Sulfa antibiotics, Kenalog [triamcinolone  acetonide], Latex, Mobic [meloxicam], and Zestril [lisinopril]   History: Past Medical History:  Diagnosis Date   Allergy    Bradycardia    Bronchitis, chronic (HCC)    Chronic anxiety    Complication of anesthesia    hard to wake up   COPD (chronic obstructive pulmonary disease) (HCC)    Depression    Esophageal reflux    Headache(784.0)    Hyperlipidemia    Hypertension    Hypothyroidism    Obesity    Osteoporosis    Overactive bladder    PVC's (premature ventricular contractions)    Thyroid  disease    Vitamin D  deficiency disease    Past Surgical History:  Procedure Laterality Date   ABDOMINAL HYSTERECTOMY     CENTRAL LINE INSERTION Right 01/10/2022   Procedure: CENTRAL LINE INSERTION;  Surgeon: Kallie Manuelita BROCKS, MD;  Location: [facility];  Service: General;  Laterality: Right;   CHOLECYSTECTOMY N/A 11/04/2013   Procedure: LAPAROSCOPIC CHOLECYSTECTOMY WITH INTRAOPERATIVE CHOLANGIOGRAM;  Surgeon: Donnice POUR. Belinda, MD;  Location: [facility];  Service: General;  Laterality: N/A;   COLONOSCOPY     FRACTURE SURGERY     pins removed from Hip surgery   HIP FRACTURE SURGERY  1990    pins removed in 1991   IR FLUORO GUIDE CV LINE LEFT  01/31/2022   IR IVC FILTER PLMT / S&I /IMG GUID/MOD SED  10/25/2022   IR US  GUIDE VASC ACCESS LEFT  01/31/2022   LAPAROTOMY N/A 01/10/2022   Procedure: EXPLORATORY LAPAROTOMY,;  Surgeon: Kallie Manuelita BROCKS, MD;  Location: [facility];  Service:  General;  Laterality: N/A;   LYSIS OF ADHESION N/A 01/10/2022   Procedure: LYSIS OF ADHESION;  Surgeon: Kallie Manuelita BROCKS, MD;  Location: [facility];  Service: General;  Laterality: N/A;   PARTIAL HYSTERECTOMY  1987   SPINAL FUSION     THORACENTESIS N/A 01/18/2022   Procedure: THORACENTESIS;  Surgeon: Claudene Toribio BROCKS, MD;  [facility]: [facility] END[facility]COPY;  Service: Pulmonary;  Laterality: N/A;   UPPER GASTROINTESTINAL ENDOSCOPY     Family History  Problem Relation Age of Onset   Arthritis Other    Heart disease Other    Cancer Other    Diabetes Other    OCD Other    Social History   Socioeconomic History   Marital status: Single    Spouse name: Not on file   Number of children: 2   Years of education: Not on  file   Highest education level: Not on file  Occupational History   Not on file  Tobacco Use   Smoking status: Never   Smokeless tobacco: Never  Vaping Use   Vaping status: Never Used  Substance and Sexual Activity   Alcohol use: No   Drug use: No   Sexual activity: Not on file  Other Topics Concern   Not on file  Social History Narrative   Not on file   Social Drivers of Health   Financial Resource Strain: Low Risk  (12/03/2023)   Overall Financial Resource Strain (CARDIA)    Difficulty of Paying Living Expenses: Not hard at all  Food Insecurity: No Food Insecurity (12/03/2023)   Hunger Vital Sign    Worried About Running Out of Food in the Last Year: Never true    Ran Out of Food in the Last Year: Never true  Transportation Needs: No Transportation Needs (12/03/2023)   PRAPARE - Administrator, Civil Service (Medical): No    Lack of Transportation (Non-Medical): No  Physical Activity: Inactive (12/03/2023)   Exercise Vital Sign    Days of Exercise per Week: 0 days    Minutes of Exercise per Session: Not on file  Stress: No Stress Concern Present (12/03/2023)   [employer] of Occupational Health - Occupational Stress Questionnaire    Feeling of Stress: Not at  all  Social Connections: Socially Isolated (12/03/2023)   Social Connection and Isolation Panel    Frequency of Communication with Friends and Family: Once a week    Frequency of Social Gatherings with Friends and Family: Once a week    Attends Religious Services: More than 4 times per year    Active Member of [employer] or Organizations: No    Attends Banker Meetings: Never    Marital Status: Widowed    Tobacco Counseling Counseling given: Yes    Clinical Intake:  Pre-visit preparation completed: Yes  Pain : No/denies pain     BMI - recorded: 36.9 Nutritional Status: BMI > 30  Obese Nutritional Risks: None Diabetes: No  Lab Results  Component Value Date   HGBA1C 4.5 (L) 09/30/2022     How often do you need to have someone help you when you read instructions, pamphlets, or other written materials from your doctor or pharmacy?: 1 - Never  Interpreter Needed?: No  Information entered by :: Alia t/cma   Activities of Daily Living     12/03/2023   12:50 PM 12/27/2022    6:43 PM  In your present state of health, do you have any difficulty performing the following activities:  Hearing? 1   Vision? 1   Difficulty concentrating or making decisions? 1   Walking or climbing stairs? 1   Dressing or bathing? 1   Comment nursing home staff   Doing errands, shopping? 1 1  Comment nursing home staff   Preparing Food and eating ? Y   Comment nursing home staff   Using the Toilet? Y   Comment nursing home staff   In the past six months, have you accidently leaked urine? Y   Do you have problems with loss of bowel control? N   Managing your Medications? Y   Comment nursing home staff   Managing your Finances? Y   Comment pt's son   Housekeeping or managing your Housekeeping? Y   Comment nursing home staff     Patient Care Team: Nguyen, Hong Thu T, NP as PCP -  General (Nurse Practitioner) Alvan Dorn FALCON, MD as PCP - Cardiology (Cardiology)  I have updated  your Care Teams any recent Medical Services you may have received from other providers in the past year.     Assessment:   This is a routine wellness examination for [name].  Hearing/Vision screen Hearing Screening - Comments:: Pt has a hearig dif/need hearing aids Vision Screening - Comments:: Pt has vision dif even w/glasses on/pt goes to [facility], [place]/last four yr----need new appt   Goals Addressed             This Visit's Progress    Exercise 3x per week (30 min per time)   On track    Encouraged chair exercises.        Depression Screen     12/03/2023   12:57 PM 12/02/2022    3:30 PM 12/02/2022    3:29 PM 05/06/2022   12:12 PM 11/28/2021    3:35 PM 09/28/2021   11:37 AM 03/01/2021   12:36 PM  PHQ 2/9 Scores  PHQ - 2 Score 1 0 0 0 2 0 2  PHQ- 9 Score 2   0 3 0 2    Fall Risk     12/03/2023   12:39 PM 12/02/2022    3:28 PM 05/06/2022   12:11 PM 11/28/2021    3:40 PM 09/28/2021   11:37 AM  Fall Risk   Falls in the past year? 0 1 0 1 0  Number falls in past yr: 0 1  0   Injury with Fall? 0 1  0   Risk for fall due to : No Fall Risks History of fall(s);Impaired balance/gait;Orthopedic patient  History of fall(s);Impaired balance/gait   Follow up Falls evaluation completed Education provided;Falls prevention discussed;Falls evaluation completed  Falls prevention discussed       Data saved with a previous flowsheet row definition    MEDICARE RISK AT HOME:  Medicare Risk at Home Any stairs in or around the home?: No If so, are there any without handrails?: No Home free of loose throw rugs in walkways, pet beds, electrical cords, etc?: Yes Adequate lighting in your home to reduce risk of falls?: Yes Life alert?: Yes Use of a cane, walker or w/c?: Yes Grab bars in the bathroom?: Yes Shower chair or bench in shower?: Yes Elevated toilet seat or a handicapped toilet?: Yes  TIMED UP AND GO:  Was the test performed?  no  Cognitive Function: 6CIT completed        12/03/2023    12:59 PM 12/02/2022    3:31 PM 11/28/2021    3:43 PM  6CIT Screen  What Year? 0 points 0 points 0 points  What month? 0 points 0 points 0 points  What time? 0 points 0 points 0 points  Count back from 20 0 points 0 points 0 points  Months in reverse 0 points 0 points 0 points  Repeat phrase 10 points 0 points 0 points  Total Score 10 points 0 points 0 points    Immunizations  There is no immunization history on file for this patient.  Screening Tests Health Maintenance  Topic Date Due   DTaP/Tdap/Td (1 - Tdap) Never done   Pneumococcal Vaccine: 50+ Years (1 of 2 - PCV) Never done   Zoster Vaccines- Shingrix (1 of 2) Never done   DEXA SCAN  Never done   COVID-19 Vaccine (1) 12/18/2024 (Originally 09/03/1950)   INFLUENZA VACCINE  12/26/2023   Medicare Annual Wellness (  AWV)  12/02/2024   Hepatitis C Screening  Completed   Hepatitis B Vaccines  Aged Out   HPV VACCINES  Aged Out   Meningococcal B Vaccine  Aged Out    Health Maintenance  Health Maintenance Due  Topic Date Due   DTaP/Tdap/Td (1 - Tdap) Never done   Pneumococcal Vaccine: 50+ Years (1 of 2 - PCV) Never done   Zoster Vaccines- Shingrix (1 of 2) Never done   DEXA SCAN  Never done   Health Maintenance Items Addressed: See Nurse Notes at the end of this note  Additional Screening:  Vision Screening: Recommended annual ophthalmology exams for early detection of glaucoma and other disorders of the eye. Would you like a referral to an eye doctor? Yes    Dental Screening: Recommended annual dental exams for proper oral hygiene  Community Resource Referral / Chronic Care Management: CRR required this visit?  No   CCM required this visit?  No   Plan:    I have personally reviewed and noted the following in the patient's chart:   Medical and social history Use of alcohol, tobacco or illicit drugs  Current medications and supplements including opioid prescriptions. Patient is currently taking opioid  prescriptions. Information provided to patient regarding non-opioid alternatives. Patient advised to discuss non-opioid treatment plan with their provider. Functional ability and status Nutritional status Physical activity Advanced directives List of other physicians Hospitalizations, surgeries, and ER visits in previous 12 months Vitals Screenings to include cognitive, depression, and falls Referrals and appointments  In addition, I have reviewed and discussed with patient certain preventive protocols, quality metrics, and best practice recommendations. A written personalized care plan for preventive services as well as general preventive health recommendations were provided to patient.   Ozie Ned, CMA   12/03/2023   After Visit Summary: (MyChart) Due to this being a telephonic visit, the after visit summary with patients personalized plan was offered to patient via MyChart   Notes: Please refer to Routing Comments.

## 2023-12-10 ENCOUNTER — Ambulatory Visit: Attending: Vascular Surgery | Admitting: Vascular Surgery

## 2023-12-10 ENCOUNTER — Encounter: Payer: Self-pay | Admitting: Vascular Surgery

## 2023-12-10 VITALS — BP 102/62 | HR 68 | Temp 97.8°F | Ht 64.0 in | Wt 184.6 lb

## 2023-12-10 DIAGNOSIS — I890 Lymphedema, not elsewhere classified: Secondary | ICD-10-CM | POA: Insufficient documentation

## 2023-12-10 NOTE — Progress Notes (Signed)
 Patient ID: [name], female   DOB: December 29, 1945, 78 y.o.   MRN: [id number]  Reason for Consult: New Patient (Initial Visit)   Referred by Tobie Orem, MD  Subjective:     HPI:  [name] is a 78 y.o. female with history of lymphedema.  She does have lymphedema pumps and has been evaluated in the lymphedema clinic in the past.  She has superficial ulceration of her left foot and leg.  She has been noncompliant with compression wraps or lymphedema pumps.  She mostly sits in a chair throughout the day walks very little and has very little activity.  Past Medical History:  Diagnosis Date   Allergy    Bradycardia    Bronchitis, chronic (HCC)    Chronic anxiety    Complication of anesthesia    hard to wake up   COPD (chronic obstructive pulmonary disease) (HCC)    Depression    Esophageal reflux    Headache(784.0)    Hyperlipidemia    Hypertension    Hypothyroidism    Obesity    Osteoporosis    Overactive bladder    PVC's (premature ventricular contractions)    Thyroid  disease    Vitamin D  deficiency disease    Family History  Problem Relation Age of Onset   Arthritis Other    Heart disease Other    Cancer Other    Diabetes Other    OCD Other    Past Surgical History:  Procedure Laterality Date   ABDOMINAL HYSTERECTOMY     CENTRAL LINE INSERTION Right 01/10/2022   Procedure: CENTRAL LINE INSERTION;  Surgeon: Kallie Manuelita BROCKS, MD;  Location: [facility];  Service: General;  Laterality: Right;   CHOLECYSTECTOMY N/A 11/04/2013   Procedure: LAPAROSCOPIC CHOLECYSTECTOMY WITH INTRAOPERATIVE CHOLANGIOGRAM;  Surgeon: Donnice POUR. Belinda, MD;  Location: [facility];  Service: General;  Laterality: N/A;   COLONOSCOPY     FRACTURE SURGERY     pins removed from Hip surgery   HIP FRACTURE SURGERY  1990    pins removed in 1991   IR FLUORO GUIDE CV LINE LEFT  01/31/2022   IR IVC FILTER PLMT / S&I /IMG GUID/MOD SED  10/25/2022   IR US  GUIDE VASC ACCESS LEFT  01/31/2022   LAPAROTOMY N/A  01/10/2022   Procedure: EXPLORATORY LAPAROTOMY,;  Surgeon: Kallie Manuelita BROCKS, MD;  Location: [facility];  Service: General;  Laterality: N/A;   LYSIS OF ADHESION N/A 01/10/2022   Procedure: LYSIS OF ADHESION;  Surgeon: Kallie Manuelita BROCKS, MD;  Location: [facility];  Service: General;  Laterality: N/A;   PARTIAL HYSTERECTOMY  1987   SPINAL FUSION     THORACENTESIS N/A 01/18/2022   Procedure: THORACENTESIS;  Surgeon: Claudene Toribio BROCKS, MD;  Location[facility] ENDOSCOPY;  Service: Pulmonary;  Laterality: N/A;   UPPER GASTROINTESTINAL ENDOSCOPY      Short Social History:  Social History   Tobacco Use   Smoking status: Never   Smokeless tobacco: Never  Substance Use Topics   Alcohol use: No    Allergies  Allergen Reactions   Celebrex [Celecoxib] Swelling   Keflex [Cephalexin] Swelling   Penicillins Hives and Swelling   Sulfa Antibiotics Swelling   Kenalog [Triamcinolone  Acetonide] Other (See Comments)    Unknown reaction   Latex Other (See Comments)    Unknown reaction   Mobic [Meloxicam] Swelling   Zestril [Lisinopril] Other (See Comments)    Unknown reaction    Current Outpatient Medications  Medication Sig Dispense Refill   ammonium  lactate (AMLACTIN) 12 % cream Apply 1 Application topically as needed for dry skin.     apixaban  (ELIQUIS ) 5 MG TABS tablet Take 5 mg by mouth 2 (two) times daily.     ferrous sulfate  325 (65 FE) MG tablet Take 1 tablet (325 mg total) by mouth daily with breakfast. 30 tablet 5   furosemide  (LASIX ) 40 MG tablet Take 1 tablet (40 mg total) by mouth 2 (two) times daily. 180 tablet 1   gabapentin  (NEURONTIN ) 100 MG capsule Take 200 mg by mouth 2 (two) times daily.     glucose 4 GM chewable tablet Chew 1 tablet (4 g total) by mouth as needed for low blood sugar. 50 tablet 12   GOODSENSE PAIN RELIEF EXTRA ST 500 MG tablet Take 500 mg by mouth every 8 (eight) hours as needed for mild pain (pain score 1-3).     levothyroxine  (SYNTHROID ) 75 MCG tablet Take 1 tablet (75  mcg total) by mouth daily. 90 tablet 1   magnesium  oxide (MAG-OX) 400 (240 Mg) MG tablet Take 400 mg by mouth daily.     methocarbamol (ROBAXIN) 500 MG tablet Take 1,000 mg by mouth 3 (three) times daily as needed for muscle spasms.     mirtazapine  (REMERON ) 7.5 MG tablet Take 1 tablet (7.5 mg total) by mouth at bedtime. For appetite stimulation 30 tablet 1   Multiple Vitamin (MULTIVITAMIN) tablet Take 1 tablet by mouth daily.     nitrofurantoin, macrocrystal-monohydrate, (MACROBID) 100 MG capsule Take 100 mg by mouth 2 (two) times daily.     oxyCODONE  (OXY IR/ROXICODONE ) 5 MG immediate release tablet Take 1 tablet (5 mg total) by mouth daily as needed for severe pain. 10 tablet 0   rOPINIRole  (REQUIP  XL) 2 MG 24 hr tablet Take 1 tablet (2 mg total) by mouth at bedtime. 10 tablet 0   sodium bicarbonate  650 MG tablet Take 650 mg by mouth 3 (three) times daily.     No current facility-administered medications for this visit.    Review of Systems  Constitutional:  Constitutional negative. HENT: HENT negative.  Eyes: Eyes negative.  Cardiovascular: Positive for leg swelling.  GI: Gastrointestinal negative.  Musculoskeletal: Positive for leg pain.  Skin: Positive for wound.  Neurological: Neurological negative. Hematologic: Hematologic/lymphatic negative.  Psychiatric: Psychiatric negative.        Objective:  Objective   Vitals:   12/10/23 1101  BP: 102/62  Pulse: 68  Temp: 97.8 F (36.6 C)  SpO2: 95%  Weight: 184 lb 9.6 oz (83.7 kg)  Height: 5' 4 (1.626 m)   Body mass index is 31.69 kg/m.  Physical Exam HENT:     Mouth/Throat:     Mouth: Mucous membranes are moist.  Eyes:     Pupils: Pupils are equal, round, and reactive to light.  Cardiovascular:     Comments: Cannot evaluate pulses significant edema bilaterally Abdominal:     General: Abdomen is flat.  Musculoskeletal:     Right lower leg: Edema present.     Left lower leg: Edema present.  Skin:    Capillary  Refill: Capillary refill takes less than 2 seconds.     Comments: Positive Stemmer sign  Neurological:     General: No focal deficit present.     Mental Status: She is alert.     Data: ABI Findings:  +---------+------------------+-----+---------+--------+  Right   Rt Pressure (mmHg)IndexWaveform Comment   +---------+------------------+-----+---------+--------+  Brachial 135                                       +---------+------------------+-----+---------+--------+  PTA     254               1.88 triphasic          +---------+------------------+-----+---------+--------+  DP      185               1.37 triphasic          +---------+------------------+-----+---------+--------+  Great Toe125               0.93 Normal             +---------+------------------+-----+---------+--------+   +---------+------------------+-----+-----------+-------+  Left    Lt Pressure (mmHg)IndexWaveform   Comment  +---------+------------------+-----+-----------+-------+  Brachial 135                                        +---------+------------------+-----+-----------+-------+  PTA     85                0.63 monophasic          +---------+------------------+-----+-----------+-------+  DP      140               1.04 multiphasic         +---------+------------------+-----+-----------+-------+  Great Toe131               0.97 Normal              +---------+------------------+-----+-----------+-------+   +-------+----------------+-----------+------------+------------+  ABI/TBIToday's ABI     Today's TBIPrevious ABIPrevious TBI  +-------+----------------+-----------+------------+------------+  Right non-compressible.93                                  +-------+----------------+-----------+------------+------------+  Left  1.04            .97                                   +-------+----------------+-----------+------------+------------+        Arterial wall calcification precludes accurate ankle pressures and ABIs in  the left lower extremity.    Summary:  Right: Resting right ankle-brachial index indicates noncompressible right  lower extremity arteries. The right toe-brachial index is normal.   Left: Resting left ankle-brachial index is within normal range. The left  toe-brachial index is normal.   Although ankle brachial indices are within normal limits (0.95-1.29),  arterial Doppler waveforms at the ankle suggest some component of arterial  occlusive disease.       Assessment/Plan:    78 year old female with bilateral lower extremity pain and swelling appears secondary to lymphedema.  I have recommended using her compression pumps at least twice daily and to elevate her legs when she is in her chair.  She also needs PT and compressive wraps as tolerated.  She can follow-up with me on an as-needed basis.     Penne Lonni Colorado MD [facility]

## 2024-01-01 ENCOUNTER — Telehealth: Payer: Self-pay

## 2024-01-01 NOTE — Telephone Encounter (Signed)
 Chasity from [facility] [place] called asking for pt's last US  (ABI on 11/27/23) and last progress note from Dr. Sheree (12/10/23) Chasity faxed over the request.  Requested information faxed to Chasity at [phone] @ [phone].

## 2024-02-05 ENCOUNTER — Ambulatory Visit: Admitting: Cardiology

## 2024-04-21 ENCOUNTER — Ambulatory Visit: Attending: Cardiology | Admitting: Cardiology

## 2024-04-21 ENCOUNTER — Encounter: Payer: Self-pay | Admitting: Cardiology

## 2024-04-21 VITALS — BP 126/70 | HR 50 | Ht 64.0 in | Wt 188.0 lb

## 2024-04-21 DIAGNOSIS — I10 Essential (primary) hypertension: Secondary | ICD-10-CM | POA: Diagnosis present

## 2024-04-21 DIAGNOSIS — I5032 Chronic diastolic (congestive) heart failure: Secondary | ICD-10-CM | POA: Diagnosis not present

## 2024-04-21 DIAGNOSIS — I890 Lymphedema, not elsewhere classified: Secondary | ICD-10-CM | POA: Insufficient documentation

## 2024-04-21 DIAGNOSIS — R600 Localized edema: Secondary | ICD-10-CM | POA: Insufficient documentation

## 2024-04-21 NOTE — Patient Instructions (Signed)
 Medication Instructions:  Your physician recommends that you continue on your current medications as directed. Please refer to the Current Medication list given to you today.  *If you need a refill on your cardiac medications before your next appointment, please call your pharmacy*  Lab Work: None If you have labs (blood work) drawn today and your tests are completely normal, you will receive your results only by: MyChart Message (if you have MyChart) OR A paper copy in the mail If you have any lab test that is abnormal or we need to change your treatment, we will call you to review the results.  Testing/Procedures: None  Follow-Up: At [facility], you and your health needs are our priority.  As part of our continuing mission to provide you with exceptional heart care, our providers are all part of one team.  This team includes your primary Cardiologist (physician) and Advanced Practice Providers or APPs (Physician Assistants and Nurse Practitioners) who all work together to provide you with the care you need, when you need it.  Your next appointment:   6 month(s)  Provider:   You may see Dina Rich, MD or the following Advanced Practice Provider on your designated Care Team:   Sharlene Dory, NP    We recommend signing up for the patient portal called "MyChart".  Sign up information is provided on this After Visit Summary.  MyChart is used to connect with patients for Virtual Visits (Telemedicine).  Patients are able to view lab/test results, encounter notes, upcoming appointments, etc.  Non-urgent messages can be sent to your provider as well.   To learn more about what you can do with MyChart, go to [email or web address].   Other Instructions

## 2024-04-21 NOTE — Progress Notes (Signed)
 Clinical Summary Ms. Jans is a 78 y.o.female seen today for follow up of the following medical problems.    1. Leg edema/Chronic diastolic HF  -11/2015 LE venous US  [place]: no DVT -09/2017 echo LVEF 60-65%, no WMAs, Diastolic function is abnormal, indeterminant grade   - was seen in lymphedema clinic, started on home pump    - 07/2023 ER evaluation, found to have AKI on CKD IV. Cr was up to 2.98, baseline 2.3 to 2.4 - in ER 200 lbs.  - difficult using her lypmhedema pump due to swelling - on waiting list for lymphedema clinic   - chronic LE edema, legs being wrapped at her nursing home - using lymphedema pump though recently some troubles working - taking lasix  40mg  bid.         2.HTN - she is compalint with meds     4.Afib, isolated episode - new diagnosis 12/2021 during admission with septic shock on pressors, vent. Also SBO that admission - started on amio at that time.  - 30 day monitor without recurrent afib - denies any palpitations   6. DVT - anticoag complicated by GI bleed - has IVC filter - on eliquis    7. CKD IV - followed by nephrology Dr Marcelino, last appt 02/2024 Past Medical History:  Diagnosis Date   Allergy    Bradycardia    Bronchitis, chronic (HCC)    Chronic anxiety    Complication of anesthesia    hard to wake up   COPD (chronic obstructive pulmonary disease) (HCC)    Depression    Esophageal reflux    Headache(784.0)    Hyperlipidemia    Hypertension    Hypothyroidism    Obesity    Osteoporosis    Overactive bladder    PVC's (premature ventricular contractions)    Thyroid  disease    Vitamin D  deficiency disease      Allergies  Allergen Reactions   Celebrex [Celecoxib] Swelling   Keflex [Cephalexin] Swelling   Penicillins Hives and Swelling   Sulfa Antibiotics Swelling   Kenalog [Triamcinolone  Acetonide] Other (See Comments)    Unknown reaction   Latex Other (See Comments)    Unknown reaction   Mobic [Meloxicam]  Swelling   Zestril [Lisinopril] Other (See Comments)    Unknown reaction     Current Outpatient Medications  Medication Sig Dispense Refill   ammonium lactate (AMLACTIN) 12 % cream Apply 1 Application topically as needed for dry skin.     apixaban  (ELIQUIS ) 5 MG TABS tablet Take 5 mg by mouth 2 (two) times daily.     ferrous sulfate  325 (65 FE) MG tablet Take 1 tablet (325 mg total) by mouth daily with breakfast. 30 tablet 5   furosemide  (LASIX ) 40 MG tablet Take 1 tablet (40 mg total) by mouth 2 (two) times daily. 180 tablet 1   gabapentin  (NEURONTIN ) 100 MG capsule Take 200 mg by mouth 2 (two) times daily.     glucose 4 GM chewable tablet Chew 1 tablet (4 g total) by mouth as needed for low blood sugar. 50 tablet 12   GOODSENSE PAIN RELIEF EXTRA ST 500 MG tablet Take 500 mg by mouth every 8 (eight) hours as needed for mild pain (pain score 1-3).     levothyroxine  (SYNTHROID ) 75 MCG tablet Take 1 tablet (75 mcg total) by mouth daily. 90 tablet 1   magnesium  oxide (MAG-OX) 400 (240 Mg) MG tablet Take 400 mg by mouth daily.  methocarbamol (ROBAXIN) 500 MG tablet Take 1,000 mg by mouth 3 (three) times daily as needed for muscle spasms.     mirtazapine  (REMERON ) 7.5 MG tablet Take 1 tablet (7.5 mg total) by mouth at bedtime. For appetite stimulation 30 tablet 1   Multiple Vitamin (MULTIVITAMIN) tablet Take 1 tablet by mouth daily.     nitrofurantoin, macrocrystal-monohydrate, (MACROBID) 100 MG capsule Take 100 mg by mouth 2 (two) times daily.     oxyCODONE  (OXY IR/ROXICODONE ) 5 MG immediate release tablet Take 1 tablet (5 mg total) by mouth daily as needed for severe pain. 10 tablet 0   rOPINIRole  (REQUIP  XL) 2 MG 24 hr tablet Take 1 tablet (2 mg total) by mouth at bedtime. 10 tablet 0   sodium bicarbonate  650 MG tablet Take 650 mg by mouth 3 (three) times daily.     No current facility-administered medications for this visit.     Past Surgical History:  Procedure Laterality Date    ABDOMINAL HYSTERECTOMY     CENTRAL LINE INSERTION Right 01/10/2022   Procedure: CENTRAL LINE INSERTION;  Surgeon: Kallie Manuelita BROCKS, MD;  Location: [facility];  Service: General;  Laterality: Right;   CHOLECYSTECTOMY N/A 11/04/2013   Procedure: LAPAROSCOPIC CHOLECYSTECTOMY WITH INTRAOPERATIVE CHOLANGIOGRAM;  Surgeon: Donnice POUR. Belinda, MD;  Location: [facility];  Service: General;  Laterality: N/A;   COLONOSCOPY     FRACTURE SURGERY     pins removed from Hip surgery   HIP FRACTURE SURGERY  1990    pins removed in 1991   IR FLUORO GUIDE CV LINE LEFT  01/31/2022   IR IVC FILTER PLMT / S&I /IMG GUID/MOD SED  10/25/2022   IR US  GUIDE VASC ACCESS LEFT  01/31/2022   LAPAROTOMY N/A 01/10/2022   Procedure: EXPLORATORY LAPAROTOMY,;  Surgeon: Kallie Manuelita BROCKS, MD;  Location: [facility];  Service: General;  Laterality: N/A;   LYSIS OF ADHESION N/A 01/10/2022   Procedure: LYSIS OF ADHESION;  Surgeon: Kallie Manuelita BROCKS, MD;  Location: [facility];  Service: General;  Laterality: N/A;   PARTIAL HYSTERECTOMY  1987   SPINAL FUSION     THORACENTESIS N/A 01/18/2022   Procedure: THORACENTESIS;  Surgeon: Claudene Toribio BROCKS, MD;  Location: [facility];  Service: Pulmonary;  Laterality: N/A;   UPPER GASTROINTESTINAL ENDOSCOPY       Allergies  Allergen Reactions   Celebrex [Celecoxib] Swelling   Keflex [Cephalexin] Swelling   Penicillins Hives and Swelling   Sulfa Antibiotics Swelling   Kenalog [Triamcinolone  Acetonide] Other (See Comments)    Unknown reaction   Latex Other (See Comments)    Unknown reaction   Mobic [Meloxicam] Swelling   Zestril [Lisinopril] Other (See Comments)    Unknown reaction      Family History  Problem Relation Age of Onset   Arthritis Other    Heart disease Other    Cancer Other    Diabetes Other    OCD Other      Social History Ms. Giraud reports that she has never smoked. She has never used smokeless tobacco. Ms. Buchinger reports no history of alcohol use.    Physical  Examination Today's Vitals   04/21/24 1428  BP: 126/70  Pulse: (!) 50  SpO2: 97%  Weight: 188 lb (85.3 kg)  Height: 5' 4 (1.626 m)   Body mass index is 32.27 kg/m.  Gen: resting comfortably, no acute distress HEENT: no scleral icterus, pupils equal round and reactive, no palptable cervical adenopathy,  CV:RRR, 2/6 systolic murmur rusb, no  jvd Resp: Clear to auscultation bilaterally GI: abdomen is soft, non-tender, non-distended, normal bowel sounds, no hepatosplenomegaly MSK: bilateral nonpitting edema Skin: warm, no rash Neuro:  no focal deficits Psych: appropriate affect   Diagnostic Studies 09/2017 echo Study Conclusions   - Left ventricle: The cavity size was normal. Wall thickness was   increased in a pattern of mild LVH. Systolic function was normal.   The estimated ejection fraction was in the range of 60% to 65%.   Wall motion was normal; there were no regional wall motion   abnormalities. - Aortic valve: Mildly calcified annulus. Trileaflet; mildly   thickened leaflets. Mean gradient (S): 8 mm Hg. Valve area (VTI):   2.13 cm^2. Valve area (Vmax): 1.86 cm^2. Valve area (Vmean): 2.05   cm^2. - Left atrium: The atrium was severely dilated. - Right atrium: The atrium was mildly dilated. - Technically adequate study.   12/2021 echo IMPRESSIONS     1. Left ventricular ejection fraction, by estimation, is 60 to 65%. The  left ventricle has normal function. The left ventricle has no regional  wall motion abnormalities. Left ventricular diastolic parameters were  normal.   2. Right ventricular systolic function is normal. The right ventricular  size is normal. There is normal pulmonary artery systolic pressure.   3. Left atrial size was mildly dilated.   4. Right atrial size was mildly dilated.   5. Calcified subchordal apparatus. The mitral valve is abnormal. Trivial  mitral valve regurgitation. No evidence of mitral stenosis.   6. The aortic valve is tricuspid.  There is mild calcification of the  aortic valve. Aortic valve regurgitation is not visualized. Aortic valve  sclerosis is present, with no evidence of aortic valve stenosis.   7. The inferior vena cava is normal in size with greater than 50%  respiratory variability, suggesting right atrial pressure of 3 mmHg.     Assessment and Plan  1. Leg edema/Chronic HFpEF/Lymphedema - euvolemic from HF standpoint, ongoing leg edema related to her lymphedema - reach out to lymphedema clinic see if can help with new order for home pump, current one is not working correctly.    2. HTN - bp at goal, continue current meds       Dorn PHEBE Ross, M.D.

## 2024-12-06 ENCOUNTER — Ambulatory Visit: Payer: Self-pay
